# Patient Record
Sex: Female | Born: 1940 | Race: Black or African American | Hispanic: No | Marital: Married | Smoking: Never smoker
Health system: Southern US, Community
[De-identification: ages and names within clinical notes are randomized; demographics above are authoritative.]

## PROBLEM LIST (undated history)

## (undated) DIAGNOSIS — F419 Anxiety disorder, unspecified: Secondary | ICD-10-CM

## (undated) DIAGNOSIS — K219 Gastro-esophageal reflux disease without esophagitis: Secondary | ICD-10-CM

## (undated) DIAGNOSIS — K7689 Other specified diseases of liver: Secondary | ICD-10-CM

## (undated) DIAGNOSIS — G43909 Migraine, unspecified, not intractable, without status migrainosus: Secondary | ICD-10-CM

## (undated) DIAGNOSIS — R58 Hemorrhage, not elsewhere classified: Secondary | ICD-10-CM

## (undated) DIAGNOSIS — Z950 Presence of cardiac pacemaker: Secondary | ICD-10-CM

## (undated) DIAGNOSIS — I4891 Unspecified atrial fibrillation: Secondary | ICD-10-CM

## (undated) DIAGNOSIS — M1990 Unspecified osteoarthritis, unspecified site: Secondary | ICD-10-CM

## (undated) DIAGNOSIS — E785 Hyperlipidemia, unspecified: Secondary | ICD-10-CM

## (undated) DIAGNOSIS — I639 Cerebral infarction, unspecified: Secondary | ICD-10-CM

## (undated) DIAGNOSIS — I2510 Atherosclerotic heart disease of native coronary artery without angina pectoris: Secondary | ICD-10-CM

## (undated) DIAGNOSIS — Z7901 Long term (current) use of anticoagulants: Secondary | ICD-10-CM

## (undated) DIAGNOSIS — R0602 Shortness of breath: Secondary | ICD-10-CM

## (undated) DIAGNOSIS — I509 Heart failure, unspecified: Secondary | ICD-10-CM

## (undated) DIAGNOSIS — I495 Sick sinus syndrome: Secondary | ICD-10-CM

## (undated) DIAGNOSIS — I071 Rheumatic tricuspid insufficiency: Secondary | ICD-10-CM

## (undated) DIAGNOSIS — I214 Non-ST elevation (NSTEMI) myocardial infarction: Secondary | ICD-10-CM

## (undated) DIAGNOSIS — R011 Cardiac murmur, unspecified: Secondary | ICD-10-CM

## (undated) DIAGNOSIS — I10 Essential (primary) hypertension: Secondary | ICD-10-CM

## (undated) DIAGNOSIS — Z9289 Personal history of other medical treatment: Secondary | ICD-10-CM

## (undated) DIAGNOSIS — I5042 Chronic combined systolic (congestive) and diastolic (congestive) heart failure: Secondary | ICD-10-CM

## (undated) DIAGNOSIS — I051 Rheumatic mitral insufficiency: Secondary | ICD-10-CM

## (undated) DIAGNOSIS — S065X9A Traumatic subdural hemorrhage with loss of consciousness of unspecified duration, initial encounter: Secondary | ICD-10-CM

## (undated) DIAGNOSIS — S065XAA Traumatic subdural hemorrhage with loss of consciousness status unknown, initial encounter: Secondary | ICD-10-CM

## (undated) DIAGNOSIS — I4892 Unspecified atrial flutter: Secondary | ICD-10-CM

## (undated) DIAGNOSIS — J189 Pneumonia, unspecified organism: Secondary | ICD-10-CM

## (undated) HISTORY — DX: Long term (current) use of anticoagulants: Z79.01

## (undated) HISTORY — DX: Rheumatic tricuspid insufficiency: I07.1

## (undated) HISTORY — DX: Non-ST elevation (NSTEMI) myocardial infarction: I21.4

## (undated) HISTORY — DX: Unspecified atrial flutter: I48.92

## (undated) HISTORY — DX: Atherosclerotic heart disease of native coronary artery without angina pectoris: I25.10

## (undated) HISTORY — DX: Traumatic subdural hemorrhage with loss of consciousness of unspecified duration, initial encounter: S06.5X9A

## (undated) HISTORY — DX: Other specified diseases of liver: K76.89

## (undated) HISTORY — PX: CORONARY ANGIOPLASTY: SHX604

## (undated) HISTORY — DX: Chronic combined systolic (congestive) and diastolic (congestive) heart failure: I50.42

## (undated) HISTORY — PX: TONSILLECTOMY: SUR1361

## (undated) HISTORY — DX: Rheumatic mitral insufficiency: I05.1

## (undated) HISTORY — PX: HAMMER TOE SURGERY: SHX385

## (undated) HISTORY — PX: TUBAL LIGATION: SHX77

## (undated) HISTORY — PX: APPENDECTOMY: SHX54

## (undated) HISTORY — DX: Traumatic subdural hemorrhage with loss of consciousness status unknown, initial encounter: S06.5XAA

## (undated) HISTORY — PX: EXPLORATORY LAPAROTOMY: SUR591

## (undated) HISTORY — PX: DILATION AND CURETTAGE OF UTERUS: SHX78

---

## 1996-09-21 HISTORY — PX: MITRAL VALVE REPLACEMENT: SHX147

## 1997-04-02 HISTORY — PX: CARDIAC CATHETERIZATION: SHX172

## 1997-12-11 ENCOUNTER — Other Ambulatory Visit: Admission: RE | Admit: 1997-12-11 | Discharge: 1997-12-11 | Payer: Self-pay | Admitting: *Deleted

## 1997-12-24 ENCOUNTER — Encounter (HOSPITAL_COMMUNITY): Admission: RE | Admit: 1997-12-24 | Discharge: 1998-03-24 | Payer: Self-pay | Admitting: Cardiology

## 1999-06-26 ENCOUNTER — Emergency Department (HOSPITAL_COMMUNITY): Admission: EM | Admit: 1999-06-26 | Discharge: 1999-06-26 | Payer: Self-pay | Admitting: *Deleted

## 1999-06-29 ENCOUNTER — Inpatient Hospital Stay (HOSPITAL_COMMUNITY): Admission: EM | Admit: 1999-06-29 | Discharge: 1999-07-01 | Payer: Self-pay | Admitting: Emergency Medicine

## 1999-06-29 ENCOUNTER — Encounter: Payer: Self-pay | Admitting: Cardiology

## 2000-02-26 ENCOUNTER — Emergency Department (HOSPITAL_COMMUNITY): Admission: EM | Admit: 2000-02-26 | Discharge: 2000-02-26 | Payer: Self-pay | Admitting: Emergency Medicine

## 2000-04-07 ENCOUNTER — Encounter: Payer: Self-pay | Admitting: Emergency Medicine

## 2000-04-07 ENCOUNTER — Emergency Department (HOSPITAL_COMMUNITY): Admission: EM | Admit: 2000-04-07 | Discharge: 2000-04-07 | Payer: Self-pay | Admitting: Emergency Medicine

## 2000-06-17 ENCOUNTER — Encounter: Payer: Self-pay | Admitting: Cardiology

## 2000-06-17 ENCOUNTER — Encounter: Admission: RE | Admit: 2000-06-17 | Discharge: 2000-06-17 | Payer: Self-pay | Admitting: Cardiology

## 2000-06-21 ENCOUNTER — Ambulatory Visit (HOSPITAL_COMMUNITY): Admission: RE | Admit: 2000-06-21 | Discharge: 2000-06-21 | Payer: Self-pay | Admitting: Cardiology

## 2001-01-19 ENCOUNTER — Other Ambulatory Visit: Admission: RE | Admit: 2001-01-19 | Discharge: 2001-01-19 | Payer: Self-pay | Admitting: *Deleted

## 2001-05-09 ENCOUNTER — Encounter: Payer: Self-pay | Admitting: *Deleted

## 2001-05-09 ENCOUNTER — Encounter: Admission: RE | Admit: 2001-05-09 | Discharge: 2001-05-09 | Payer: Self-pay | Admitting: *Deleted

## 2002-02-01 ENCOUNTER — Other Ambulatory Visit: Admission: RE | Admit: 2002-02-01 | Discharge: 2002-02-01 | Payer: Self-pay | Admitting: *Deleted

## 2002-08-03 ENCOUNTER — Emergency Department (HOSPITAL_COMMUNITY): Admission: EM | Admit: 2002-08-03 | Discharge: 2002-08-03 | Payer: Self-pay | Admitting: Emergency Medicine

## 2002-08-14 ENCOUNTER — Emergency Department (HOSPITAL_COMMUNITY): Admission: EM | Admit: 2002-08-14 | Discharge: 2002-08-14 | Payer: Self-pay | Admitting: Emergency Medicine

## 2003-02-16 ENCOUNTER — Other Ambulatory Visit: Admission: RE | Admit: 2003-02-16 | Discharge: 2003-02-16 | Payer: Self-pay

## 2003-08-07 HISTORY — PX: OTHER SURGICAL HISTORY: SHX169

## 2003-09-05 ENCOUNTER — Inpatient Hospital Stay (HOSPITAL_COMMUNITY): Admission: AD | Admit: 2003-09-05 | Discharge: 2003-09-12 | Payer: Self-pay | Admitting: Cardiology

## 2005-02-03 ENCOUNTER — Other Ambulatory Visit: Admission: RE | Admit: 2005-02-03 | Discharge: 2005-02-03 | Payer: Self-pay | Admitting: Family Medicine

## 2005-04-14 ENCOUNTER — Encounter: Admission: RE | Admit: 2005-04-14 | Discharge: 2005-04-14 | Payer: Self-pay | Admitting: Family Medicine

## 2005-11-09 ENCOUNTER — Encounter: Payer: Self-pay | Admitting: Emergency Medicine

## 2005-11-09 ENCOUNTER — Inpatient Hospital Stay (HOSPITAL_COMMUNITY): Admission: EM | Admit: 2005-11-09 | Discharge: 2005-11-13 | Payer: Self-pay | Admitting: Cardiology

## 2005-11-09 ENCOUNTER — Ambulatory Visit: Payer: Self-pay | Admitting: Internal Medicine

## 2005-12-02 ENCOUNTER — Ambulatory Visit: Payer: Self-pay

## 2005-12-29 ENCOUNTER — Ambulatory Visit: Payer: Self-pay | Admitting: Internal Medicine

## 2006-03-11 ENCOUNTER — Ambulatory Visit: Payer: Self-pay | Admitting: Cardiology

## 2006-08-02 ENCOUNTER — Ambulatory Visit: Payer: Self-pay | Admitting: Gastroenterology

## 2006-09-06 ENCOUNTER — Ambulatory Visit: Payer: Self-pay | Admitting: Gastroenterology

## 2006-09-07 ENCOUNTER — Ambulatory Visit: Payer: Self-pay | Admitting: Gastroenterology

## 2006-12-13 ENCOUNTER — Inpatient Hospital Stay (HOSPITAL_COMMUNITY): Admission: AD | Admit: 2006-12-13 | Discharge: 2006-12-28 | Payer: Self-pay | Admitting: *Deleted

## 2006-12-15 HISTORY — PX: PACEMAKER PLACEMENT: SHX43

## 2006-12-15 HISTORY — PX: INSERT / REPLACE / REMOVE PACEMAKER: SUR710

## 2008-01-19 ENCOUNTER — Encounter: Admission: RE | Admit: 2008-01-19 | Discharge: 2008-01-19 | Payer: Self-pay | Admitting: Family Medicine

## 2008-02-20 ENCOUNTER — Encounter: Admission: RE | Admit: 2008-02-20 | Discharge: 2008-02-20 | Payer: Self-pay | Admitting: Family Medicine

## 2009-01-30 ENCOUNTER — Other Ambulatory Visit: Admission: RE | Admit: 2009-01-30 | Discharge: 2009-01-30 | Payer: Self-pay | Admitting: Family Medicine

## 2009-02-12 ENCOUNTER — Ambulatory Visit: Payer: Self-pay | Admitting: Gastroenterology

## 2009-02-12 DIAGNOSIS — K5900 Constipation, unspecified: Secondary | ICD-10-CM | POA: Insufficient documentation

## 2009-02-13 LAB — CONVERTED CEMR LAB
Basophils Absolute: 0 10*3/uL (ref 0.0–0.1)
Basophils Relative: 0.3 % (ref 0.0–3.0)
Calcium: 9.6 mg/dL (ref 8.4–10.5)
Chloride: 108 meq/L (ref 96–112)
Eosinophils Absolute: 0.2 10*3/uL (ref 0.0–0.7)
Eosinophils Relative: 1.9 % (ref 0.0–5.0)
Lymphocytes Relative: 23.9 % (ref 12.0–46.0)
Lymphs Abs: 2 10*3/uL (ref 0.7–4.0)
MCHC: 34.1 g/dL (ref 30.0–36.0)
MCV: 92.8 fL (ref 78.0–100.0)
Monocytes Absolute: 1.2 10*3/uL — ABNORMAL HIGH (ref 0.1–1.0)
Monocytes Relative: 13.6 % — ABNORMAL HIGH (ref 3.0–12.0)
Neutrophils Relative %: 60.3 % (ref 43.0–77.0)
Potassium: 4.6 meq/L (ref 3.5–5.1)
TSH: 2.21 microintl units/mL (ref 0.35–5.50)
WBC: 8.5 10*3/uL (ref 4.5–10.5)

## 2009-06-21 ENCOUNTER — Ambulatory Visit: Payer: Self-pay | Admitting: Oncology

## 2009-07-09 LAB — MORPHOLOGY - CHCC SATELLITE: PLT EST ~~LOC~~: ADEQUATE

## 2009-07-09 LAB — CBC WITH DIFFERENTIAL (CANCER CENTER ONLY)
BASO%: 0.9 % (ref 0.0–2.0)
EOS%: 2 % (ref 0.0–7.0)
LYMPH#: 1.7 10*3/uL (ref 0.9–3.3)
MCHC: 33 g/dL (ref 32.0–36.0)
NEUT#: 5.5 10*3/uL (ref 1.5–6.5)
NEUT%: 67.5 % (ref 39.6–80.0)
Platelets: 237 10*3/uL (ref 145–400)
RDW: 13.5 % (ref 10.5–14.6)

## 2009-07-09 LAB — CMP (CANCER CENTER ONLY)
AST: 46 U/L — ABNORMAL HIGH (ref 11–38)
Albumin: 3.6 g/dL (ref 3.3–5.5)
Alkaline Phosphatase: 123 U/L — ABNORMAL HIGH (ref 26–84)
Potassium: 3.9 mEq/L (ref 3.3–4.7)
Sodium: 138 mEq/L (ref 128–145)
Total Bilirubin: 0.6 mg/dl (ref 0.20–1.60)
Total Protein: 9.2 g/dL — ABNORMAL HIGH (ref 6.4–8.1)

## 2009-07-11 LAB — SPEP & IFE WITH QIG
Beta Globulin: 6.3 % (ref 4.7–7.2)
Gamma Globulin: 26.1 % — ABNORMAL HIGH (ref 11.1–18.8)
IgA: 666 mg/dL — ABNORMAL HIGH (ref 68–378)
IgG (Immunoglobin G), Serum: 2110 mg/dL — ABNORMAL HIGH (ref 694–1618)
Total Protein, Serum Electrophoresis: 8.6 g/dL — ABNORMAL HIGH (ref 6.0–8.3)

## 2009-07-11 LAB — KAPPA/LAMBDA LIGHT CHAINS: Lambda Free Lght Chn: 1.41 mg/dL (ref 0.57–2.63)

## 2009-07-11 LAB — BETA 2 MICROGLOBULIN, SERUM: Beta-2 Microglobulin: 1.71 mg/L (ref 1.01–1.73)

## 2009-07-11 LAB — IRON AND TIBC
%SAT: 30 % (ref 20–55)
Iron: 99 ug/dL (ref 42–145)

## 2009-07-15 LAB — UIFE/LIGHT CHAINS/TP QN, 24-HR UR
Beta, Urine: DETECTED — AB
Free Lambda Excretion/Day: 4.25 mg/d
Free Lambda Lt Chains,Ur: 0.17 mg/dL (ref 0.08–1.01)
Free Lt Chn Excr Rate: 45 mg/d
Gamma Globulin, Urine: DETECTED — AB
Time: 24 hours
Total Protein, Urine-Ur/day: 135 mg/d (ref 10–140)
Volume, Urine: 2500 mL

## 2009-08-12 ENCOUNTER — Ambulatory Visit: Payer: Self-pay | Admitting: Oncology

## 2009-08-19 LAB — CMP (CANCER CENTER ONLY)
ALT(SGPT): 23 U/L (ref 10–47)
AST: 50 U/L — ABNORMAL HIGH (ref 11–38)
Alkaline Phosphatase: 122 U/L — ABNORMAL HIGH (ref 26–84)
Creat: 0.7 mg/dl (ref 0.6–1.2)
Total Bilirubin: 0.5 mg/dl (ref 0.20–1.60)

## 2009-08-19 LAB — CBC WITH DIFFERENTIAL (CANCER CENTER ONLY)
BASO%: 1.2 % (ref 0.0–2.0)
EOS%: 2.6 % (ref 0.0–7.0)
HCT: 44.7 % (ref 34.8–46.6)
LYMPH%: 28.8 % (ref 14.0–48.0)
MCHC: 32.9 g/dL (ref 32.0–36.0)
MCV: 93 fL (ref 81–101)
MONO%: 6.7 % (ref 0.0–13.0)
NEUT%: 60.7 % (ref 39.6–80.0)
Platelets: 210 10*3/uL (ref 145–400)
RDW: 13.8 % (ref 10.5–14.6)

## 2010-03-06 ENCOUNTER — Ambulatory Visit: Payer: Self-pay | Admitting: Oncology

## 2010-03-14 LAB — CBC WITH DIFFERENTIAL (CANCER CENTER ONLY)
BASO#: 0.1 10*3/uL (ref 0.0–0.2)
EOS%: 2.2 % (ref 0.0–7.0)
Eosinophils Absolute: 0.2 10*3/uL (ref 0.0–0.5)
LYMPH%: 26.3 % (ref 14.0–48.0)
MCV: 92 fL (ref 81–101)
MONO%: 11.7 % (ref 0.0–13.0)
NEUT%: 59.1 % (ref 39.6–80.0)
Platelets: 284 10*3/uL (ref 145–400)
RBC: 4.21 10*6/uL (ref 3.70–5.32)
RDW: 13.4 % (ref 10.5–14.6)
WBC: 7.6 10*3/uL (ref 3.9–10.0)

## 2010-03-14 LAB — CMP (CANCER CENTER ONLY)
ALT(SGPT): 22 U/L (ref 10–47)
BUN, Bld: 14 mg/dL (ref 7–22)
CO2: 27 mEq/L (ref 18–33)
Glucose, Bld: 101 mg/dL (ref 73–118)
Potassium: 4.1 mEq/L (ref 3.3–4.7)
Sodium: 138 mEq/L (ref 128–145)
Total Bilirubin: 0.7 mg/dl (ref 0.20–1.60)

## 2010-03-14 LAB — LACTATE DEHYDROGENASE: LDH: 296 U/L — ABNORMAL HIGH (ref 94–250)

## 2010-03-18 LAB — SPEP & IFE WITH QIG
Albumin ELP: 49 % — ABNORMAL LOW (ref 55.8–66.1)
Gamma Globulin: 24.2 % — ABNORMAL HIGH (ref 11.1–18.8)
IgG (Immunoglobin G), Serum: 2260 mg/dL — ABNORMAL HIGH (ref 694–1618)
Total Protein, Serum Electrophoresis: 8.7 g/dL — ABNORMAL HIGH (ref 6.0–8.3)

## 2010-03-18 LAB — HAPTOGLOBIN: Haptoglobin: <6 mg/dL — ABNORMAL LOW (ref 16–200)

## 2010-03-18 LAB — DIRECT ANTIGLOBULIN TEST (NOT AT ARMC): DAT (Complement): NEGATIVE

## 2010-11-14 ENCOUNTER — Other Ambulatory Visit: Payer: Self-pay | Admitting: Oncology

## 2010-11-14 ENCOUNTER — Encounter (HOSPITAL_BASED_OUTPATIENT_CLINIC_OR_DEPARTMENT_OTHER): Payer: Medicare Other | Admitting: Oncology

## 2010-11-14 DIAGNOSIS — D892 Hypergammaglobulinemia, unspecified: Secondary | ICD-10-CM

## 2010-11-14 DIAGNOSIS — E8809 Other disorders of plasma-protein metabolism, not elsewhere classified: Secondary | ICD-10-CM

## 2010-11-14 DIAGNOSIS — R7989 Other specified abnormal findings of blood chemistry: Secondary | ICD-10-CM

## 2010-11-14 LAB — COMPREHENSIVE METABOLIC PANEL
AST: 46 U/L — ABNORMAL HIGH (ref 0–37)
Albumin: 4.2 g/dL (ref 3.5–5.2)
Alkaline Phosphatase: 110 U/L (ref 39–117)
BUN: 16 mg/dL (ref 6–23)
Chloride: 102 mEq/L (ref 96–112)
Glucose, Bld: 95 mg/dL (ref 70–99)
Potassium: 4.4 mEq/L (ref 3.5–5.3)

## 2010-11-14 LAB — CBC WITH DIFFERENTIAL/PLATELET
BASO%: 0.8 % (ref 0.0–2.0)
Basophils Absolute: 0.1 10*3/uL (ref 0.0–0.1)
EOS%: 2.2 % (ref 0.0–7.0)
HCT: 41.4 % (ref 34.8–46.6)
HGB: 14.2 g/dL (ref 11.6–15.9)
MCH: 30.5 pg (ref 25.1–34.0)
MCHC: 34.3 g/dL (ref 31.5–36.0)
MCV: 88.8 fL (ref 79.5–101.0)
MONO%: 8.8 % (ref 0.0–14.0)
NEUT#: 4.5 10*3/uL (ref 1.5–6.5)
Platelets: 230 10*3/uL (ref 145–400)
RBC: 4.66 10*6/uL (ref 3.70–5.45)
RDW: 15.9 % — ABNORMAL HIGH (ref 11.2–14.5)
WBC: 7.7 10*3/uL (ref 3.9–10.3)
nRBC: 0 % (ref 0–0)

## 2010-11-18 ENCOUNTER — Encounter (HOSPITAL_BASED_OUTPATIENT_CLINIC_OR_DEPARTMENT_OTHER): Payer: Medicare Other | Admitting: Oncology

## 2010-11-18 DIAGNOSIS — I059 Rheumatic mitral valve disease, unspecified: Secondary | ICD-10-CM

## 2010-11-18 DIAGNOSIS — D892 Hypergammaglobulinemia, unspecified: Secondary | ICD-10-CM

## 2010-11-18 LAB — SPEP & IFE WITH QIG
Albumin ELP: 49.2 % — ABNORMAL LOW (ref 55.8–66.1)
Beta 2: 7.8 % — ABNORMAL HIGH (ref 3.2–6.5)
Gamma Globulin: 24.3 % — ABNORMAL HIGH (ref 11.1–18.8)
IgA: 613 mg/dL — ABNORMAL HIGH (ref 68–378)
IgM, Serum: 82 mg/dL (ref 60–263)

## 2010-11-18 LAB — HAPTOGLOBIN: Haptoglobin: <6 mg/dL — ABNORMAL LOW (ref 16–200)

## 2010-11-18 LAB — KAPPA/LAMBDA LIGHT CHAINS: Kappa:Lambda Ratio: 1.21 (ref 0.26–1.65)

## 2011-02-06 NOTE — Discharge Summary (Signed)
NAME:  Carrie [name], Carrie [name]                           ACCOUNT NO.:  [id number]   MEDICAL RECORD NO.:  [id number]                   PATIENT TYPE:  INP   LOCATION:  3739                                 FACILITY:  [facility]   PHYSICIAN:  Thereasa Solo. Little, M.D.              DATE OF BIRTH:  January 17, 1941   DATE OF ADMISSION:  09/05/2003  DATE OF DISCHARGE:  09/12/2003                                 DISCHARGE SUMMARY   DISCHARGE DIAGNOSES:  1. Syncope.  2. Positive tilt table, medications adjusted this admission.  3. Paroxysmal atrial fibrillation, amiodarone increased after     electrophysiological evaluation this admission.  4. St. Jude mitral valve replacement in 1998.  5. Normal coronaries after abnormal Cardiolite study.  6. Elevated liver function tests, Vitorin has been discontinued.  7. Coumadin therapy.   HOSPITAL COURSE:  The patient is a 70 year old female followed by Dr.  Clarene Duke.  She has a history of a mitral valve replacement.  She has had Y  complex tachycardia in the past and has been worked up by Dr. Ladona Ridgel.  She  has had some recurrent syncope.  She had a Cardiolite study as an outpatient  that was abnormal.  She was taken off Coumadin as an outpatient and put on  Lovenox and admitted for diagnostic catheterization.  She was admitted on  September 05, 2003, and started on heparin.  Catheterization was done  September 06, 2003, by Dr. Alanda Amass which revealed normal coronaries and  normal LV function.  She did have wide QRS tachycardia on a Holter monitor.  She was seen by Dr. Ladona Ridgel.  EP study was done September 06, 2003.  She had  multiple left atrial flutter.  Dr. Ladona Ridgel recommended increasing her  amiodarone.  The patient was put back on Coumadin and covered with heparin.  It was noted that her LFTs were elevated and her Vitorin was held.  She  continued to have episodes of syncope.  One was after getting up off a  commode.  She had some transient bradycardia and junctional rhythms.   Tilt  table was done September 11, 2003, by Dr. Alanda Amass.  This revealed a gradual  vasopressor response to chills.  Dr. Alanda Amass recommended cutting back on  her Cardizem and stopping her diuretic.  At this point, he did not recommend  ProAmatine or Florinef.  Hope is that amiodarone will prevent her from  having recurrent atrial flutter.  We feel she can be discharged September 12, 2003.   DISCHARGE MEDICATIONS:  1. Amiodarone 400 mg today.  2. Pepcid AC if needed.  3. Diltiazem 120 mg a day.  4. Coumadin 5 mg a day as taken prior to admission.  5. Lisinopril 20 mg a day.  6. She is to stop her Vitorin and Diazide.   LABORATORY DATA:  At discharge, INR 2.8.  Sodium 137, potassium 5.1, BUN 14,  creatinine 1.1, hemoglobin  11.1, hematocrit 32.3, white count 6.3, platelets  310.  TSH 2.68.   DISPOSITION:  The patient discharged in stable condition.  Will have to  watch her INR closely on increased amiodarone.  She will have a protime  Monday.  She will follow up with Dr. Clarene Duke on September 27, 2003.  She will  need follow-up LFTs as an outpatient also as we stopped her Vitorin because  of LFT elevation.  Her SGOT was 78k, SGPT 47.      Abelino Derrick, P.A.                      Thereasa Solo. Little, M.D.    Lenard Lance  D:  09/12/2003  T:  09/13/2003  Job:  [id number]   cc:   Doylene Canning. Ladona Ridgel, M.D.

## 2011-02-06 NOTE — Cardiovascular Report (Signed)
NAME:  Carrie [name], Carrie [name]                           ACCOUNT NO.:  [id number]   MEDICAL RECORD NO.:  [id number]                   PATIENT TYPE:  INP   LOCATION:  3739                                 FACILITY:  [facility]   PHYSICIAN:  Richard A. Alanda Amass, M.D.          DATE OF BIRTH:  1941/04/09   DATE OF PROCEDURE:  09/06/2003  DATE OF DISCHARGE:                              CARDIAC CATHETERIZATION   PROCEDURE:  1. Retrograde central aortic catheterization.  2. Selective coronary angiography by Judkins technique.  3. Left ventricular angiogram RAO/LAO projection.  4. Abdominal angiogram hand injection.   BRIEF HISTORY:  This very pleasant 14 year old married mother of two with a  fourth grandchildren on her way is a nonsmoker.  Has presumed history of  rheumatic fever as a child with St. Jude MVR 1998.  Past history of  aberrated wide complex tachycardia in 1998 and seen by Dr. Ladona Ridgel at that  time.  She has been on chronic amiodarone therapy.  Has had a history of  PAF, is on chronic Coumadin for MVR which has been stable.  Prior coronary  arteries were normal as was LV.  She was admitted with recurrent syncope.  This occurred while sitting on a commode but she has had similar episodes in  the past not related to this often associated with palpitations.  Outpatient  Holter showed WCT which was regular.  It was difficult to tell whether this  was SVT with aberration (atrial flutter) or VT.  She was referred for  catheterization in this setting to assess coronaries and LV function.   DESCRIPTION OF PROCEDURE:  The left groin was prepped, draped in the usual  manner.  1% Xylocaine was used for local anesthesia and the LFA was entered  with a single anterior puncture using an 18 thin wall needle.  Coumadin had  been on hold.  Heparin was on hold and INR was 1.4.  A short Dake 6-French  sidearm sheath was inserted without difficulty and diagnostic coronary  angiography and LV angiogram were  done with 6-French 4 cm taper Cordis  preformed coronary and pigtail catheters.  Crossing the aortic valve was  without difficulty and there was no gradient across the aortic valve.  Hand  injection of pigtail catheter above the level of the renal arteries revealed  single normal renal arteries bilaterally and normal immediate infrarenal  abdominal aorta.   LV angiogram was done in the RAO and LAO projection 25 mL 14 mL/second, 20  mL 12 mL/second.  Pullback pressure of the CA showed no gradient across the  aortic valve.  Hand injection of the aortic root showed no AI present.   Catheter was removed.  Sidearm sheath was flushed.  The patient tolerated  the procedure well and sheath was removed in the laboratory with pressure  hemostasis over the left groin.  The left side was used since the patient  will have planned  EPS study with Dr. Ladona Ridgel from the right-sided venous  approach.   PRESSURES:  LV 130/0.  LVEDP 18 mmHg.   CA 130/63 mmHg.   There is no gradient across the aortic valve on catheter pullback.   There is no AI with hand injection of the aortic root which appeared normal.   LV angiogram shows a normally contracting LV with EF greater than 55%, no  segmental wall motion abnormality, and no MR present.  The mitral valve ring  to the St. Jude valve was easily visualized and the valve appeared to open  normally fluoroscopically.   Fluoroscopy did not show any significant coronary, intracardiac, or valvular  calcification.   The main left coronary was normal.  There was a small optional diagonal  branch that was normal.   The circumflex artery was of moderate size, nondominant, bifurcated  distally, and gave off a moderate sized PABG branch, all of which were  normal.   The LAD was tortuous, coursed to the apex of the heart where it bifurcated,  then was normal.  There was a large DX1 after SP1 that was normal and it  bifurcated distally.   The right coronary was  dominant and normal with normal PDA/PLA, RV branches  and it was mildly tortuous.   DISCUSSION:  History as outlined above.  The patient has normal LV function,  no evidence of aortic valve disease, and presumed normal St. Jude mitral  valve prosthesis without MR in this study.  Coronary arteries remain normal.   She has mild elevation of LFTs which could be related to Vytorin  administration or could be related to her chronic amiodarone administration.  She is scheduled for EPS evaluation with Dr. Ladona Ridgel and possible ablation if  this represents SVT (atrial flutter with aberration).  The etiology of her  syncope could be tachyarrhythmic and/or bradyarrhythmic from after  conversion based on her history and available data.   CATHETERIZATION DIAGNOSES:  1. Normal coronary arteries.  2. Normal left ventricle.  3. No aortic valve gradient or aortic insufficiency.  4. Syncope with nonsustained WCT on Holter monitor, recurrent episodes of     presyncope and syncope, and palpitations.  5. Status post mitral valve replacement functioning normally, 1998.  6. Hyperlipidemia.                                               Richard A. Alanda Amass, M.D.    RAW/MEDQ  D:  09/06/2003  T:  09/07/2003  Job:  [id number]   cc:   Doylene Canning. Ladona Ridgel, M.D.   Talmadge Coventry, M.D.  [address]  [place]  [place] [address]  Fax: [phone]

## 2011-02-06 NOTE — Consult Note (Signed)
NAMEJAKIAH, BIENAIME                 ACCOUNT NO.:  [id number]   MEDICAL RECORD NO.:  [id number]          PATIENT TYPE:  INP   LOCATION:  6532                         FACILITY:  [facility]   PHYSICIAN:  Gustavus Messing. Orlin Hilding, M.D.DATE OF BIRTH:  02-Jan-1941   DATE OF CONSULTATION:  11/10/2005  DATE OF DISCHARGE:                                   CONSULTATION   REASON FOR CONSULTATION:  Syncope or near syncope.   HISTORY OF PRESENT ILLNESS:  Ms. Rockholt is a 70 year old right handed black  African American woman with a history of atrial fibrillation and mitral  valve disease status post mitral valve replacement.  She is on Digitek,  Diltiazem, and Coumadin.  She has a ten year history of pre-syncope or near  syncope events, possibly syncope, as well.  According to the patient, she  gets a spell about every other month, roughly six times a year.  She had a  spell yesterday that was fairly severe which brought her to the emergency  room  and warranted admission.  Her spells are similar in character, but may  vary in severity and duration.  Usually, she is standing when it occurs and  it usually starts with a sensation of light headedness, palpitations,  diaphoresis, and blurred vision, she feels like she is going to faint.  She  will try to sit down or lie down and hold very still and it may pass at that  level after a few minutes or it may progress to nausea and vomiting and  collapse.  It is unclear if there is loss of consciousness, she is not able  to tell me that.  As noted, when it occurs, she tends to light down or sit  down, hold very still and close her eyes.  When it is severe, it may last  for several hours and then it clears.  This event yesterday did last for  several hours and today she is at her normal baseline this morning.  There  is no associated weakness or numbness on one side of the body or the other,  no associated slurred speech or dysphagia.  She did have one episode of  incontinence during a spell years ago but none recently.  She has never had  any convulsions or tongue biting.  No significant headache with this.   REVIEW OF SYMPTOMS:  Out of a 12 system review which encompass  cardiovascular, pulmonary, neurologic, hematologic, GI, GU, musculoskeletal,  ENT, reproductive, skin, mucosa, pain, sleep, and nutrition with the  following positive; syncope, light headedness, nausea, arthritis and joint  pain in her fingers, partial dentures in the bottom, corrective lenses for  reading.  She is post menopausal.  She does have some difficulty sleeping.   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  She has mitral valve replacement,  tubal ligation, a cyst removed from her intestines, hyperlipidemia, history  of diverticulitis.  The patient denies any previous  history of migraine.   MEDICATIONS ON ADMISSION:  Diltiazem, Coumadin, Ranitidine, Lisinopril,  Digitek, and Temazepam.   HOSPITAL MEDICATIONS:  She is on Diltiazem, Lanoxin, Lisinopril, Pepcid,  Coumadin, Tylenol, and Ambien.   ALLERGIES:  CODEINE.   SOCIAL HISTORY:  She has two children and six grandchildren.  She does not  smoke.  She lives at home with her husband, she is retired.   FAMILY HISTORY:  Negative for seizure or migraine.   PHYSICAL EXAMINATION:  VITAL SIGNS:  Temperature 97.9, heart rate in the 80s to 90s, blood pressure  132/60, 90% saturation on room air, respirations 16.  HEENT:  Head normocephalic, atraumatic.  Ears, nose and throat are clear  without erythema.  NECK:  Supple without bruits.  LUNGS:  Clear to auscultation.  HEART:  Irregularly irregular.  EXTREMITIES:  No edema or cyanosis.  NEUROLOGIC:  She is awake, alert, appropriate with normal language and  orientation, oriented x 3.  Cranial nerves:  Pupils equal and  reactive,  visual fields full, extraocular movements intact, facial sensation is  normal, facial motor activity is normal, hearing intact, palate symmetrical,  tongue midline, there is no dysarthria.  On motor exam, she has normal  station and gait.  She has normal bulk, tone, and strength, 5/5 strength in  all four extremities.  No drift, no fasciculations, atrophy, or tremor.  Reflexes are 1-2+ and symmetrical with downgoing toes, plantar stimulation.  Coordination, finger-to-nose, alternating movements, heel-to-shin, tandem  gait are normal.  She can balance on either foot.  Sensory exam intact to  pin prick.   LABORATORY DATA:  There are no neurological imaging studies to evaluate.  She did have a CT of the head back in July 2001 with a syncopal episode  which is negative.  She has normal white blood cell count.  Normal  hemoglobin.  INR is 2.9 on her Coumadin.  BMP was normal.   ASSESSMENT:  Near syncope with spells about six times a year for ten years.  Differential would be:  1.  Neurocardiogenic syncope given her positive tilt table test in the past.  2.  Other cardiac origin as worked up by cardiology.  3.  Vertebrobasilar insufficiency.  4.  Seizure, she did have one episode of incontinence.  5.  Atypical migraines.   RECOMMENDATIONS:  Get an MRI scan of the brain with MRA of the head and neck  to evaluate the vertebrobasilar circulation if the St. Jude is MR  compatible, otherwise a CT of the head with CT angio of the head and neck  would be helpful.  We will also get an EEG.  If the cardiac workup is  completely negative, we can consider after her loop monitor evaluation,  empiric treatment with Depakote or Topamax for coverage of seizure and  migraine, but I am not convinced that seizure or migraine are likely.  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  We will proceed with a neurological workup, however.     Catherine A. Orlin Hilding,  M.D.  Electronically Signed     CAW/MEDQ  D:  11/10/2005  T:  11/10/2005  Job:  [id number]   cc:   Thereasa Solo. Little, M.D.  Fax: [phone]

## 2011-02-06 NOTE — Discharge Summary (Signed)
Carrie [name], Carrie [name]                 ACCOUNT NO.:  [id number]   MEDICAL RECORD NO.:  [id number]          PATIENT TYPE:  INP   LOCATION:  6532                         FACILITY:  [facility]   PHYSICIAN:  Thereasa Solo. Little, M.D. DATE OF BIRTH:  01/26/1941   DATE OF ADMISSION:  11/09/2005  DATE OF DISCHARGE:  11/13/2005                                 DISCHARGE SUMMARY   DISCHARGE DIAGNOSES:  1.  Near syncopal spell, status post loop recorder implantation this      admission.  2.  History of paroxysmal atrial fibrillation.  3.  St. Jude mitral valve replacement in 1998.  4.  Chronic Coumadin therapy.  5.  Treated hypertension.   HOSPITAL COURSE:  The patient is a 70 year old female followed by Dr. Clarene Duke  and Dr. Smith Mince with a history of atrial fibrillation and St. Jude mitral  valve replacement. She has normal coronaries and LV function in December  2004. She has been seen by Dr. Ladona Ridgel and Dr. Severiano Gilbert for EP. She was last  seen by Dr. Clarene Duke in November 2006 and was in sinus rhythm at that time.  Today, on admission at [facility] she developed a dizzy spell with  increased heart rate. She is seen in the emergency room. In the emergency  room she was in atrial fibrillation with a rate of 78. Her INR was 2.9.  She  was admitted to telemetry and seen by Dr. Jenne Campus. She also had hyponatremia  on admission with a sodium of 122. This was repeated the next day and was  138. The patient was seen by the EP service. Dr. Graciela Husbands recommended a loop  recorder implantation. Her LFTs were elevated and it was decided that the  patient should not be on amiodarone. She was also seen in consult by the  neurology service for her dizziness and near syncope. An MRI, MRA, and EEG  were obtained. The EEG was normal, MRI was normal for vertebrobasilar  insufficiency and MRI showed possible tiny infarct. Dr. Thurston Hole did not think  that that was the explanation for the patient's dizziness. Dr. Graciela Husbands saw the  patient  after this. The patient was set up for a loop recorder which was  implanted on November 12, 2005, by Dr. Antoine Poche. We felt she could be  discharged on November 13, 2005.  She is to follow up at the [facility] on December 02, 2005. Her Lanoxin was increased at discharge.   DISCHARGE MEDICATIONS:  1.  Cardizem CD 240 mg daily.  2.  Lanoxin 0.25 mg daily.  3.  Lisinopril 20 mg daily.  4.  Zantac 150 mg daily.  5.  Coumadin 7.5 mg on Monday, Wednesday, and Saturday, and 5 mg other days.   She will have a protime checked in one week. She will follow up with Dr.  Ladona Ridgel and Dr. Clarene Duke.   LABORATORY DATA:  White count 7.8, hemoglobin 12.1, hematocrit 35.5,  platelet count 288,000. INR on the 23rd was 2.6. Sodium 138, potassium 4.8,  BUN 11, creatinine 0.9, AST 38, ALT 14. CK and troponin were negative.  Digoxin level was 0.4. Amiodarone level was low at 0.12.  Urine sodium was  normal at 64.   DISPOSITION:  The patient is discharged in stable condition. She will follow  up with Dr. Ladona Ridgel and Dr. Clarene Duke.      Carrie [name], P.A.    ______________________________  Thereasa Solo. Little, M.D.    Lenard Lance  D:  01/12/2006  T:  01/13/2006  Job:  [id number]   cc:   Talmadge Coventry, M.D.  Fax: [phone]

## 2011-02-06 NOTE — Op Note (Signed)
NAME:  Carrie [name], DRABIK                           ACCOUNT NO.:  [id number]   MEDICAL RECORD NO.:  [id number]                   PATIENT TYPE:  INP   LOCATION:  3739                                 FACILITY:  [facility]   PHYSICIAN:  Doylene Canning. Ladona Ridgel, M.D.               DATE OF BIRTH:  10-May-1941   DATE OF PROCEDURE:  09/06/2003  DATE OF DISCHARGE:                                 OPERATIVE REPORT   PROCEDURE PERFORMED:  Invasive electrophysiologic study with induction of  arrhythmia and intracardiac mapping of arrhythmias.   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  She has had a  history of palpitations in the past.  She has had syncope.  She had a  cardiac monitor placed for recurrent palpitations and was found to have  sustained wide QRS tachycardia at rates of approximately 140 to 150 beats  per minute.  These palpitations were associated with syncope.  She is now  referred for invasive electrophysiologic testing.  Of note, the patient has  undergone catheterization demonstrating normal LV systolic function with no  coronary disease.   II:  PROCEDURE:  After informed consent was obtained, the patient was taken  to the diagnostic EP lab in a fasting state.  A 5 French quadripolar  catheter was inserted percutaneously in the right femoral vein and advanced  to the RV apex.  A 5 French quadripolar catheter was inserted percutaneously  in the right femoral vein and advanced to the His bundle region.  A 7 French  quadripolar catheter was inserted percutaneously in the right femoral vein  and advanced to the right atrium.  A 6 French hexapolar catheter was  inserted percutaneously into the right jugular vein and advanced to the  coronary sinus.  After measurement of the basic intervals, mapping was  carried out demonstrating an HV interval of 57 milliseconds.  Rapid atrial  pacing was carried out from the  coronary sinus at a pacing cycle length of  590 milliseconds.  This resulted in the initiation of atrial tachycardia.  Mapping of this tachycardia which had a cycle length of approximately 580 to  590 milliseconds demonstrated the earliest activation in the left atrium  which preceded the His bundle lesion which preceded all activation in the  right atrium consistent with a left atrial tachycardia.  This would last for  several minutes and then stopped spontaneously.  Next, programmed atrial  stimulation was carried out from the coronary sinus at a paced cycle length  of 500 milliseconds.  The S1 and S2 interval was step wise decreased from  440 milliseconds down to 360 milliseconds where the AV node ERP was  observed.  During programmed atrial stimulation, there was a recurrence of  the patient's slow left atrial tachycardia.  Next, rapid ventricular pacing  was carried out  demonstrating VA dissociation.  At this point, additional  rapid atrial pacing was carried out from the coronary sinus at 290  milliseconds and step wise decreased down to 250 milliseconds.  This  resulted in the initiation of multiple left atrial flutters with multiple  cycle lengths and, as noted, atrial activations.  The most dominant rhythm  was a counter clockwise activation of the right atrium with the earliest  left atrial activation occurring in the distal coronary sinus.  This  arrhythmia was consistent but not diagnostic of a left atrial flutter  wrapping around the superior pulmonary vein.  The tachycardia would  spontaneously degenerate into atrial fibrillation.  There was intermittent  bundle branch block aberrancy with the patient heavily sedated and lying on  the table.  During flutter, isoproterenol was infused at a rate of 1 mcg per  minute.  This resulted in an increase both of the atrial flutter cycle  length as well as the variability of the atrial flutter as well as an  increase in the AV conduction  resulting in sustained left bundle branch  block wide QRS pattern.  Of note, during the atrial flutter with her bundle  branch block, the patient had a reproduction of her clinical symptoms that  were present prior to her syncopal spells.  At this point, the isoproterenol  was discontinued and the patient underwent a programmed ventricular  stimulation from the right ventricle.  S1-S2, S1-S2-S3, and S1-S2-S3-S4  stimuli were then delivered by way of the RV apex.  S1-S2, S2-S3, S3-S4,  stimuli were delivered with the S1-S2, S2-S3, and S3-S4 interval step wise  decreased down to ventricular refractions.  During programmed ventricular  stimulation from the RV apex there was no inducible VT.  At this point, the  patient's underlying atrial flutter persisted and she was deeply sedated  with Fentanyl and Versed and underwent a 150 joule biphasic synchronized DC  shock which transiently restored sinus rhythm.  However, frequent PACs  resulted in the reinitiation of atrial flutter.  At this point, the  catheters were removed, hemostasis assured, and the patient returned to the  room in satisfactory condition.  Of note, during the reinitiation of atrial  flutter, the AV conduction was markedly reduced, now that isoproterenol was  discontinued, with a ventricular rate between 70 and 90 beats per minute.   III:  COMPLICATIONS:  There were no immediate procedure complications.   IV:  RESULTS:  A.  Baseline ECG.  The baseline ECG demonstrates normal sinus rhythm with  normal axis and intervals.  B.  Baseline intervals.  The HP interval was 57 milliseconds.  C.  Rapid atrial pacing.  Rapid atrial pacing initially carried out  demonstrated an AV Wenckebach cycle length of 430 milliseconds.  During  rapid atrial pacing, there was an atrial tachycardia which was left atrial  in origin and would terminate spontaneously.  This had a cycle length of approximately 584 milliseconds.  There was intermittent  bundle branch block  with this.  D.  Programmed atrial stimulation.  Programmed atrial stimulation was  carried out from the coronary sinus as well as the high right atrium at  paced cycle length of 500 milliseconds.  The S1 and S2 interval was stepwise  decreased from 440 milliseconds down to 360 milliseconds where the AV node  ARP was observed.  During programmed atrial stimulation, there were no AH  jumps or echo beats noted.  It should be noted that during programmed atrial  stimulation,  the patient's slow left atrial tachycardia at 584 milliseconds  was reproducibly reinduced.  E.  Rapid ventricular pacing.  Rapid ventricular pacing demonstrated VA  dissociation.  F.  Programmed ventricular stimulation.  Programmed ventricular stimulation  was carried out from the RV apex with paced cycle lengths of 500 and 400  milliseconds.  S1-S2, S1-S2-S3, S1-S2-S3-S4 stimuli were delivered with the  S1-S2, S2-S3, S3-S4 interval step wise decreased down to ventricular  refractions.  During programmed ventricular stimulation, there was no  inducible VT.  G.  Arrhythmias observed:  1. Left atrial tachycardia initiation, rapid atrial pacing, duration     sustained, termination spontaneous, cycle length 584 milliseconds.  2. Atrial flutter initiation, rapid atrial pacing, duration sustained,     termination was with DC cardioversion, cycle length was quite variable     with multiple atrial activations consistent with multiple atrial circuits     with the clear theme of mapping demonstrating origination of these     circuits predominantly from the left atrium.     A. Mapping:  Mapping of the patient's atrial flutters demonstrated left        atrial activation as previously described.   V:  CONCLUSION:  The study demonstrates multiple left atrial inducible  flutters.  It also demonstrates reproduction of the patient's clinical  arrhythmia, both symptomatically and based on the EKG.  This  demonstrates no  inducible VT.  The recommendation at this time would be to treat the patient  with increasing doses of amiodarone.  Because of her left atrial dysfunction  and prior mitral valve replacement and scarring in the left atrium, I think  an  aggressive approach with left atrial flutter ablation would likely not be  warranted.  If she had recurrence of her symptoms despite increasing doses  of Amiodarone, then consideration for AV node ablation and pacemaker  insertion would also be a consideration with subsequent discontinuation of  her Amiodarone.                                               Doylene Canning. Ladona Ridgel, M.D.    GWT/MEDQ  D:  09/06/2003  T:  09/07/2003  Job:  [id number]   cc:   Thereasa Solo. Little, M.D.  [address]  [address]  [place]  [place] [address]  Fax: [phone]   Christella Noa, M.D.  [address]  [place], [place] [address]  Fax: [phone]

## 2011-02-06 NOTE — Discharge Summary (Signed)
NAMEKARA, MELCHING NO.:  [id number]   MEDICAL RECORD NO.:  [id number]          PATIENT TYPE:  INP   LOCATION:  2008                         FACILITY:  [facility]   PHYSICIAN:  Darcella Gasman. [place], N.P.  DATE OF BIRTH:  April 29, 1941   DATE OF ADMISSION:  12/13/2006  DATE OF DISCHARGE:  12/28/2006                               DISCHARGE SUMMARY   DISCHARGE DIAGNOSES:  1. Heparin Coumadin cross over secondary to St. Jude mitral valve      replacement in 1998 and now need for permanent pacemaker.  2. St. Jude multiple valve replacement 1998.  3. Paroxysmal atrial fibrillation with known sick sinus syndrome.  4. Hypertension.  5. Syncope in the past.  Loop recorder had been added with results of      atrial fibrillation with ventricular rate 180-200 as well as 3-4      second pauses.  6. History of electrophysiologic study by Dr. Lewayne Bunting with      negative for inducible ventricular tachycardia.  7. Normal coronary arteries and normal left ventricular function.  8. Pacer pocket hematoma.   DISCHARGE CONDITION:  Stable.   DISCHARGE MEDICATIONS:  1. Diltiazem 240 mg daily.  2. Lisinopril 10 mg daily.  3. Lanoxin  0.125 mg Monday, Wednesday and Friday.  4. Zantac 150 mg twice daily.  5. Toprol XL 50 mg daily.  6. Coumadin 7.5 mg Monday, Tuesday, Wednesday and Friday.  5 mg on      Thursday, Saturday and Sunday.  7. Lorazepam 0.5 mg at night as needed.  8. Ambien 5 mg at night as needed  9. Vitamin E every other day, hold for one week.  10.Cephalexin 500 mg twice daily.   DISCHARGE INSTRUCTIONS:  1. Have labs done on December 30, 2006 to check Coumadin.  2. Increase activity slowly, may walk up steps.  No lifting for two      weeks.  No driving for one week.  Please see pacer site      instructions for further information.  3. Low sodium heart healthy diet.  4. Keep the incision clean and dry.  Call for redness, swelling or      drainage.  5. Followup with Dr.  Mikey Bussing nurse practitioner, Nada Boozer, on      December 31, 2006.  Office will call with a time.  6. Followup with Dr. Clarene Duke as previously scheduled.   HISTORY OF PRESENT ILLNESS:  This is a 70 year old female with history  of hypertension, St. Jude mitral valve replacement, PAF and sick sinus  syndrome.  History of syncope in the past,  non-inducible for  ventricular tachycardia by Dr. Ladona Ridgel.  Loop recorded had been added.  Dr. Jenne Campus had interrogated the loop recorders.  She was having atrial  fibrillation with heart rates of 180-200 and in addition to that 3-4  second pauses.  The Lopressor needed to be re-started.  Due to her  history of syncope in the past, it was felt she would need permanent  pacemaker.   She was seen and evaluated by Dr. Domingo Sep.  She  had been holding her  Coumadin for Heparin Coumadin crossover.   PAST MEDICAL HISTORY:  As described.   OUTPATIENT MEDICATIONS:  1. Cardizem 240 mg  2. Coumadin as directed.  3. Lanoxin 0.25 mg half every other day.  4. Lisinopril 20 mg daily.  5. Zantac daily.  6. Metoprolol ER 25 mg half daily.   ALLERGIES:  NO KNOWN DRUG ALLERGIES.   FAMILY HISTORY/SOCIAL HISTORY/REVIEW OF SYSTEMS:  See H&P.   PHYSICAL EXAMINATION:  VITAL SIGNS:  At discharge blood pressure 115/61.  Pulse 87.  Respiratory rate is 18.  Temperature 97.  Oxygen saturation  98%.  GENERAL:  Pacer site with hematoma but much improved from 1 week ago.  HEART:  Regular rate and rhythm.  LUNGS:  Clear.  ABDOMEN:  Positive bowel sounds.   LABORATORY DATA:  Admitting labs:  Hemoglobin 13.4, hematocrit 39.8, WBC  8.9, platelets 270, neutrophils 54, lymphs 32, monos none, eos 4,  basophils 1.  These remained stable throughout hospitalization.  Pro-  Time on admission 23.8, INR of 2.  She was started on Heparin and was  therapeutic on Heparin.  INR drifted down to 1.5 for pacer.   She was restarted on Heparin as well as Coumadin and developed hematoma.   Heparin was held.  INR gradually rose and at discharge was 2.6.   Chemistry:  Sodium 138, potassium 4.3, chloride 105, CO2 25, glucose  183, BUN 16, creatinine 0.93.  These remained stable.  Total bili is  8.6, albumin 4.  AST 77, ALT 44 and alkaline phos 128.  Total bili 0.9.  Magnesium 2.4.  AST and ALT did come down during hospitalization.  At  discharge AST 54, ALT 29 and alkaline phos 114.  TSH was 1.224.  UA  showed a few leukocyte esterase.   Chest x-ray:  Left pacemaker placement with lead overlying the right  ventricle.  No pneumothorax.  Stable cardiomegaly.  Mild pulmonary  vascular congestion.  EKG on March 25, atrial fibrillation with  premature beats.  Nonspecific T wave abnormalities.   PROCEDURE:  December 15, 2006:  Permanent pacemaker implantation by Dr.  Lenise Herald with a Medtronic Adapta placement.  The patient tolerated  the procedure.  It was a single chamber lead.   HOSPITAL COURSE:  Ms. Schomer was admitted for a Heparin Coumadin  crossover secondary to St. Jude mitral valve and need for permanent  pacemaker with sick sinus syndrome with heart rates up to 180 to 200 in  atrial fibrillation and other times 3-4 second pauses on a loop  recorder.  She was admitted and placed on IV Heparin and underwent  permanent pacemaker on December 15, 2006.   The patient tolerated the procedure well.  She was restarted on Heparin  secondary to the valve.  Unfortunately, she did develop a hematoma.  It  was expressed several times.  Heparin was held and Coumadin was  continued.  By March 29, hematoma was stable.  Heparin was restarted  after discussion with Dr. Graciela Husbands and Dr. Orvan Falconer and Dr. Domingo Sep.  Hematoma remained stable.  There was no discharge by April 1.  The  patient continued to be stable and plans were to discharge once her INR  was therapeutic.  By April 3, she had bloody discharge from the pacer site.  It was expressed.  It did not appear infected.  She was kept   another day.  By April 4, she continued with hematoma but was stable and  Heparin was  discontinued and continued to monitor.  By April 5, minimal  drainage from the pacer pocket.  Otherwise the patient was stable.  No  infection noted.   Continue to monitor closely.  Minimal drainage was occurring at the  pocket site.  By April 8, the pocket was still with hematoma with some  oozing present.  Discussed with Dr. Jenne Campus and felt that she was stable  to be discharged but she would be followed up as an outpatient with  close  followup.      Darcella Gasman. Annie Paras, N.P.     LRI/MEDQ  D:  02/22/2007  T:  02/22/2007  Job:  [id number]   cc:   Thereasa Solo. Little, M.D.  Talmadge Coventry, M.D.

## 2011-02-06 NOTE — Op Note (Signed)
NAMEKALIEGH, WILLADSEN NO.:  [id number]   MEDICAL RECORD NO.:  [id number]          PATIENT TYPE:  INP   LOCATION:  6532                         FACILITY:  [facility]   PHYSICIAN:  Rollene Rotunda, M.D.   DATE OF BIRTH:  1941-04-27   DATE OF PROCEDURE:  11/12/2005  DATE OF DISCHARGE:  11/09/2005                                 OPERATIVE REPORT   PREOPERATIVE DIAGNOSIS:  Syncope.   POSTOPERATIVE DIAGNOSIS:  Syncope.   PROCEDURE:  Implantable loop recorder.   PROCEDURE NOTE:  Appropriate informed consent was obtained.  The risks and  benefits were explained to the patient.  Very clearly, the risks of bleeding  was explained as her INR was 3.1.  However, it was felt that it was prudent  to proceed as she would need to have a therapeutic INR with her mitral valve  replacement.  Also, having therapeutic Coumadin level will allow [place] to avoid  heparin or Lovenox which would exacerbate bleeding.  The risks of infection  was also explained.   The patient was brought to the EP lab.  The left parasternal area was  prepped and draped in a sterile fashion.  The area was mapped for optimal  tracings.  The skin was anesthetized with lidocaine.  A 2.5 cm incision was  carried through the skin down to the prepectoral fascia.  A pocket was  formed using only blunt dissection.  I used electrocautery and careful  inspection to insure hemostasis.  The pocket was then copiously irrigated  with Kanamycin solution and inspected again for hemostasis.  Using two silk  sutures, a Reveal Plus Medtronic recorder was fixed in the pocket, serial  number [id number], model number 9526.  The device was inserted into the  pocket and the pocket and device were again copiously irrigated with  Kanamycin solution.  Again, the pocket was visually inspected to insure  hemostasis.  The pocket was then closed in two layers using 2-0 Vicryl in a  running vertical mattress followed by 2-0 Vicryl in a running  horizontal  mattress suture.  The subcutaneous layer was a 4-0 Vicryl in a running  horizontal layer.  The wound was then prepped and draped with Benzoin  solution and Steri-Strips.  A pressure dressing was applied.  We will watch  the wound carefully and insure that she keeps her arm relatively still for  the next 24 hours.  There were no apparent complications.           ______________________________  Rollene Rotunda, M.D.     JH/MEDQ  D:  11/12/2005  T:  11/12/2005  Job:  [id number]   cc:   Milus Banister, M.D.

## 2011-02-06 NOTE — H&P (Signed)
Carrie [name], Carrie [name]                 ACCOUNT NO.:  [id number]   MEDICAL RECORD NO.:  [id number]          PATIENT TYPE:  INP   LOCATION:  6532                         FACILITY:  [facility]   PHYSICIAN:  Thereasa Solo. Little, M.D. DATE OF BIRTH:  [birth year]   DATE OF ADMISSION:  11/09/2005  DATE OF DISCHARGE:                                HISTORY & PHYSICAL   CHIEF COMPLAINT:  Dizziness.   HISTORY OF PRESENT ILLNESS:  Patient is a 70 year old female with a history  of chronic atrial fibrillation, St. Jude mitral valve replacement, and  normal coronaries and LV function in December of 2004.  She has been seen by  Dr. Ladona Ridgel and Dr. Severiano Gilbert.  Her last office visit with Dr. Clarene Duke was in  November 2006.  She was in sinus rhythm at that time.  Today while at  [facility] she suddenly became dizzy with tachycardia and had to sit down in  the middle of the aisle.  Her symptoms were associated with nausea and  diaphoresis.  In the emergency room she is in atrial fibrillation with  ventricular response in the 70s.   PAST MEDICAL HISTORY:  PAF.  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   PAST SURGICAL HISTORY:  1.  St. Jude mitral valve replacement in 1988.  2.  She has had tubal ligation.  3.  Abdominal cyst removed as a teenager.   MEDICATIONS:  1.  Cardizem CD 240 daily.  2.  Coumadin 7.5 mg on Monday, Wednesday, and Saturday and 5 mg other days.  3.  Lanoxin 0.125 mg a day.  4.  Lisinopril 20 mg a day.  5.  Zantac 150 daily.   She has no known drug allergies.   SOCIAL HISTORY:  She is married.  She has two children, five grandchildren.  She is a nonsmoker.   FAMILY HISTORY:  Unremarkable.   REVIEW OF SYSTEMS:  Essentially unremarkable except for noted above.  She  denies any recent illness or fever.  She is on no new medications.   PHYSICAL EXAMINATION:  VITAL SIGNS:  Blood pressure 155/82, pulse 75,  respirations 16, temperature 97.9.  GENERAL:  She is a  well-developed, well-nourished female in no acute  distress.  HEENT:  Normocephalic.  Extraocular movements are intact.  Sclera is non-  icteric.  Lids and conjunctivae are within normal limits.  NECK:  Without bruit, without JVD.  CHEST:  Clear to auscultation, percussion.  CARDIAC:  Irregularly irregular rhythm without obvious murmur or rub.  There  is positive bowel sounds.  ABDOMEN:  Midline surgical scar.  Nontender.  Bowel sounds are present.  EXTREMITIES:  Without edema.  Distal pulses are 3+/4 bilaterally.  NEUROLOGIC:  Grossly intact.   IMPRESSION:  1.  Near syncopal spell with previous EP evaluation.  2.  Paroxysmal atrial fibrillation now in atrial fibrillation.  3.  History of St. Jude mitral valve replacement in 1988.  4.  Hypertension, controlled.  5.  Hyponatremia.  Sodium on admission was 122.  She is now on a diuretic.  6.  Hypocalcemia, potassium 3 on admission.   PLAN:  She will be given saline at 75 mL an hour and will replace her  potassium.  She will need admission to telemetry.      Abelino Derrick, P.A.    ______________________________  Thereasa Solo. Little, M.D.    Lenard Lance  D:  11/12/2005  T:  11/12/2005  Job:  [id number]

## 2011-02-06 NOTE — Op Note (Signed)
Carrie [name], Carrie [name] NO.:  [id number]   MEDICAL RECORD NO.:  [id number]          PATIENT TYPE:  INP   LOCATION:  2008                         FACILITY:  [facility]   PHYSICIAN:  Darlin Priestly, MD  DATE OF BIRTH:  07-23-1941   DATE OF PROCEDURE:  12/15/2006  DATE OF DISCHARGE:                               OPERATIVE REPORT   PROCEDURE:  1. Removal of a Medtronic Loop Recorder serial [id number], model      [id number], initially implanted November 12, 2005 by Dr. Rollene Rotunda.  2. Implant of a Medtronic Adapta generator model [id number], serial      [id number] H with single passive ventricular lead.   SURGEON:  Lenise Herald, MD   COMPLICATIONS:  None.   INDICATION:  Ms. Cumbee is a 70 year old female, patient of Dr. Caprice Kluver, Dr. Smith Mince with a history of a St. Jude mitral valve repair  in 1998 as well as a history of chronic atrial fibrillation/flutter.  She did undergo a loop recorder insertion after the patient had syncopal  episodes and was noted to have atrial fibrillation rates up to 180 as  well as 3-4 second pauses.  She is now brought for pacer implant to  allow for adequate treatment of her atrial fibrillation.   DESCRIPTION OF PROCEDURE:  After obtaining informed consent, the patient  was brought into the cardiac catheterization laboratory where the left  chest was prepped and draped in the sterile fashion.  ECG monitoring was  established, 1% lidocaine used to anesthetize over the previously  implanted loop recorder.  Next, approximately 1.5 cm incision was  carried out just beneath the prior loop recorder incision.  Blunt  dissection was used to carry this down to the loop capsule.  The capsule  was then opened and the loop recorder was then visualized.  The  anchoring sutures were the freed from the header of the loop device and  then loop was then removed from the pocket.  As previously stated this  was a Medtronic Reveal Plus model [id number], serial  [id number] device.  The  pocket was then inspected and hemostasis was confirmed.  The pocket was  the copiously irrigated with 1% gentamicin solution.  The subcutaneous  layer was then closed with running 2-0 Vicryl. The skin was then closed  using 4-0 Vicryl.   Attention was then turned to the left subclavian area for the implant of  the pacemaker.  The 1% lidocaine was then used to anesthetize the left  mid infraclavicular area.  Approximately 3 cm horizontal mid  infraclavicular incision was then carried out and hemostasis obtained  with electrocautery.  Blunt dissection was used to carried this down to  the left pectoral fascia.  A 4 cm pocket was then created over the left  pectoral fascia and good hemostasis was obtained with electrocautery.  The left subclavian vein was then visualized with contrast  administration and the vein was then entered using Doppler Smart needle.  The guide wire was then advanced into the SVC.  Two silk sutures were  then placed at the base  of the retaining guide wire.  Over the retaining  guide wire, a #7 [place] safe sheath were then easily passed and the  guide wire and dilator were then removed.  Through this, an active 52 cm  Medtronic lead model #4076, serial [id number] was then passed into the  right atrium and we were then able to capture 2 volts.  The screw was  then extended and thresholds determined.  There was an excellent  interior current noted.  R waves were measured at 11.4 millivolts.  Impedance at 993 Ohms.  Threshold in the ventricle was 0.5 volts at 0.4  milliseconds.  Current is 0.6 millivolts.  The 10 volts were negative  for diaphragmatic stimulation.  The lead was then sutured into place  with two silk sutures on the lead.  The pocket was then copiously  irrigated with 1% gentamicin solution.  The lead was then connected to a  Medtronic Adapta generator model [id number], serial [id number] H.  Head  screw was tightened.  The pacing was  confirmed.  A single silk suture in  place in the superior aspect of the pocket.  The generator leads were  then delivered into the pocket and header was screwed to the silk  suture.  Subcutaneous layers were the closed with running 2-0 Vicryl.  The skin was then closed using a 4-0 Vicryl.  Steri-Strips were then  applied about the pacer site as well as the loop removal site.  The  patient was returned to the recovery room in stable condition.   CONCLUSIONS:  1. Successful removal of a Medtronic implantable loop recorder.  2. Implant of a Medtronic Adapta [id number] generator serial [id number] H      with single active ventricular leads.      Darlin Priestly, MD  Electronically Signed     RHM/MEDQ  D:  12/15/2006  T:  12/15/2006  Job:  [id number]   cc:   Talmadge Coventry, M.D.  Thereasa Solo. Little, M.D.

## 2011-02-06 NOTE — Assessment & Plan Note (Signed)
[facility]                           GASTROENTEROLOGY OFFICE NOTE   Carrie [name], DORIN                        MRN:          [id number]  DATE:08/02/2006                            DOB:          September 11, 1941    Maevyn comes in to discuss having a colonoscopic examination. She was advised  by Dr. Smith Mince to follow up on this. She states she has been doing well.  She does have some constipation problems, hemorrhoid problems, but nothing  of great difficulty. She is on Coumadin for mitral valve replacement  therapy. She also takes amiodarone and she takes Digitek, metoprolol, and  Ambien p.r.n. She states she has been doing well as far as her GI system is  concerned. Her last colonoscopic examination only revealed a small polyp and  some moderate severe diverticular disease and a small rectocele. She was  advised to have this repeated in 4-5 years and I told her it was at her  convenience and I thought it should be followed up. I told her that she  should talk to Dr. Caprice Kluver about whether she should be done on or off of  her medication.   Her past medical history reveals she has hypertension as well as the  arrhythmia problem. She has hyperlipidemia, hyperthyroidism. She has had  arthritis, problems with anxiety, depression. She is status post tubal  ligation, appendectomy and the heart valve surgery that was previously  mentioned.   SOCIAL HISTORY:  Really non-contributory. She does not drink or smoke.   REVIEW OF SYSTEMS:  Reveals only the new depression and anxiety problems and  headaches, night sweats and sleeping problems.   PHYSICAL EXAMINATION:  She looks great for her age. She weighs 140. Blood  pressure 124/82. Pulse 64 and regular.  Neck, heart and extremities are all basically unremarkable.   IMPRESSION:  1. Status post colon polyp with moderate severe diverticular disease.  2. Mitral valve replacement, on Coumadin with history of  paroxysmal atrial      fibrillation.  3. Hypertension.  4. Anxiety, depression.  5. Status post tubal ligation.  6. Hyperthyroidism.  7. Hyperlipidemia.     Ulyess Mort, MD  Electronically Signed    SML/MedQ  DD: 08/02/2006  DT: 08/02/2006  Job #: [id number]   cc:   Thereasa Solo. Little, M.D.  Doylene Canning. Ladona Ridgel, MD

## 2011-02-06 NOTE — Procedures (Signed)
EEG NUMBER:  [id number]   ORDERED BY:  Dr. Orlin Hilding   CLINICAL HISTORY:  This is a 70 year old woman being evaluated for syncope.  Medications include amiodarone, Pepcid, diltiazem, Coumadin and lisinopril.   DESCRIPTION OF RECORDING:  This was a routine 17-channel EEG with one  channel devoted to EKG utilizing the International 10/20 lead placement  system. The patient is described as being awake and alert clinically,  however electrographically she appears awake and drowsy. While clearly awake  the background consists of a well-organized, well-developed, well-modulated  9-10 Hz alpha activity which was predominant in the posterior head regions  and reactive to eye opening. During drowsiness there was some attenuation of  the background with decrease frequency and amplitude and some central sharp  activity was seen. Hyperventilation was not performed. Photic stimulation  was performed but did not produce significant change in the background  activity. The EKG monitor reveals an irregularly irregular rhythm compatible  with the patient's clinical history of atrial fibrillation with a rate in  the 80s to mid 90s.   CONCLUSION:  Normal EEG in the waking and drowsy state without seizure  activity or focal abnormality seen during the course today's recording. The  EKG monitor and does reveal atrial fibrillation. Clinical correlation is  recommended.      Catherine A. Orlin Hilding, M.D.  Electronically Signed     ZOX:WRUE  D:  11/10/2005 14:03:43  T:  11/10/2005 22:32:59  Job #:  [id number]   cc:   Thereasa Solo. Little, M.D.  Fax: [phone]

## 2011-07-31 ENCOUNTER — Telehealth: Payer: Self-pay | Admitting: Oncology

## 2011-07-31 NOTE — Telephone Encounter (Signed)
per pof 11/18/2010 called pt number in the system and it has been changed.  will mail appt to pt on 07/31/2011

## 2011-08-25 ENCOUNTER — Telehealth: Payer: Self-pay | Admitting: Oncology

## 2011-08-25 NOTE — Telephone Encounter (Signed)
pts called and r/s missed appts in Oct2012 to jan2013 °

## 2011-09-06 ENCOUNTER — Other Ambulatory Visit: Payer: Self-pay

## 2011-09-06 ENCOUNTER — Inpatient Hospital Stay (HOSPITAL_COMMUNITY)
Admission: EM | Admit: 2011-09-06 | Discharge: 2011-09-30 | DRG: 871 | Disposition: A | Discharge status: Skilled Nursing Facility | Payer: Medicare Other | Attending: Family Medicine | Admitting: Family Medicine

## 2011-09-06 ENCOUNTER — Emergency Department (HOSPITAL_COMMUNITY): Payer: Medicare Other

## 2011-09-06 DIAGNOSIS — I609 Nontraumatic subarachnoid hemorrhage, unspecified: Secondary | ICD-10-CM | POA: Diagnosis present

## 2011-09-06 DIAGNOSIS — R509 Fever, unspecified: Secondary | ICD-10-CM

## 2011-09-06 DIAGNOSIS — K5900 Constipation, unspecified: Secondary | ICD-10-CM | POA: Diagnosis not present

## 2011-09-06 DIAGNOSIS — I10 Essential (primary) hypertension: Secondary | ICD-10-CM | POA: Diagnosis present

## 2011-09-06 DIAGNOSIS — G9340 Encephalopathy, unspecified: Secondary | ICD-10-CM | POA: Diagnosis present

## 2011-09-06 DIAGNOSIS — F411 Generalized anxiety disorder: Secondary | ICD-10-CM | POA: Diagnosis present

## 2011-09-06 DIAGNOSIS — R5381 Other malaise: Secondary | ICD-10-CM | POA: Diagnosis not present

## 2011-09-06 DIAGNOSIS — J129 Viral pneumonia, unspecified: Secondary | ICD-10-CM

## 2011-09-06 DIAGNOSIS — Y831 Surgical operation with implant of artificial internal device as the cause of abnormal reaction of the patient, or of later complication, without mention of misadventure at the time of the procedure: Secondary | ICD-10-CM | POA: Diagnosis not present

## 2011-09-06 DIAGNOSIS — D649 Anemia, unspecified: Secondary | ICD-10-CM | POA: Diagnosis not present

## 2011-09-06 DIAGNOSIS — R6889 Other general symptoms and signs: Secondary | ICD-10-CM | POA: Diagnosis present

## 2011-09-06 DIAGNOSIS — I38 Endocarditis, valve unspecified: Secondary | ICD-10-CM | POA: Diagnosis present

## 2011-09-06 DIAGNOSIS — N390 Urinary tract infection, site not specified: Secondary | ICD-10-CM

## 2011-09-06 DIAGNOSIS — I482 Chronic atrial fibrillation, unspecified: Secondary | ICD-10-CM

## 2011-09-06 DIAGNOSIS — Z954 Presence of other heart-valve replacement: Secondary | ICD-10-CM

## 2011-09-06 DIAGNOSIS — I4891 Unspecified atrial fibrillation: Secondary | ICD-10-CM | POA: Diagnosis present

## 2011-09-06 DIAGNOSIS — M6281 Muscle weakness (generalized): Secondary | ICD-10-CM

## 2011-09-06 DIAGNOSIS — Z952 Presence of prosthetic heart valve: Secondary | ICD-10-CM

## 2011-09-06 DIAGNOSIS — Z7901 Long term (current) use of anticoagulants: Secondary | ICD-10-CM

## 2011-09-06 DIAGNOSIS — A419 Sepsis, unspecified organism: Secondary | ICD-10-CM

## 2011-09-06 DIAGNOSIS — E876 Hypokalemia: Secondary | ICD-10-CM | POA: Diagnosis not present

## 2011-09-06 DIAGNOSIS — Z950 Presence of cardiac pacemaker: Secondary | ICD-10-CM | POA: Diagnosis present

## 2011-09-06 DIAGNOSIS — M129 Arthropathy, unspecified: Secondary | ICD-10-CM | POA: Diagnosis present

## 2011-09-06 DIAGNOSIS — Z79899 Other long term (current) drug therapy: Secondary | ICD-10-CM

## 2011-09-06 DIAGNOSIS — T827XXA Infection and inflammatory reaction due to other cardiac and vascular devices, implants and grafts, initial encounter: Secondary | ICD-10-CM | POA: Diagnosis not present

## 2011-09-06 DIAGNOSIS — E871 Hypo-osmolality and hyponatremia: Secondary | ICD-10-CM | POA: Diagnosis not present

## 2011-09-06 DIAGNOSIS — R52 Pain, unspecified: Secondary | ICD-10-CM

## 2011-09-06 DIAGNOSIS — I330 Acute and subacute infective endocarditis: Secondary | ICD-10-CM | POA: Diagnosis not present

## 2011-09-06 DIAGNOSIS — G4700 Insomnia, unspecified: Secondary | ICD-10-CM | POA: Diagnosis not present

## 2011-09-06 DIAGNOSIS — A403 Sepsis due to Streptococcus pneumoniae: Principal | ICD-10-CM | POA: Diagnosis present

## 2011-09-06 DIAGNOSIS — E785 Hyperlipidemia, unspecified: Secondary | ICD-10-CM | POA: Diagnosis present

## 2011-09-06 HISTORY — DX: Unspecified osteoarthritis, unspecified site: M19.90

## 2011-09-06 HISTORY — DX: Sick sinus syndrome: I49.5

## 2011-09-06 HISTORY — DX: Pneumonia, unspecified organism: J18.9

## 2011-09-06 HISTORY — DX: Anxiety disorder, unspecified: F41.9

## 2011-09-06 HISTORY — DX: Hyperlipidemia, unspecified: E78.5

## 2011-09-06 HISTORY — DX: Unspecified atrial fibrillation: I48.91

## 2011-09-06 HISTORY — DX: Essential (primary) hypertension: I10

## 2011-09-06 LAB — COMPREHENSIVE METABOLIC PANEL
Alkaline Phosphatase: 97 U/L (ref 39–117)
BUN: 27 mg/dL — ABNORMAL HIGH (ref 6–23)
Chloride: 103 mEq/L (ref 96–112)
Creatinine, Ser: 0.96 mg/dL (ref 0.50–1.10)
GFR calc Af Amer: 68 mL/min — ABNORMAL LOW (ref >90)
Glucose, Bld: 159 mg/dL — ABNORMAL HIGH (ref 70–99)
Potassium: 3.8 mEq/L (ref 3.5–5.1)
Total Bilirubin: 1 mg/dL (ref 0.3–1.2)
Total Protein: 8.1 g/dL (ref 6.0–8.3)

## 2011-09-06 LAB — CBC
HCT: 37.1 % (ref 36.0–46.0)
Hemoglobin: 11.7 g/dL — ABNORMAL LOW (ref 12.0–15.0)
MCH: 29 pg (ref 26.0–34.0)
MCH: 29.5 pg (ref 26.0–34.0)
MCHC: 33.4 g/dL (ref 30.0–36.0)
MCHC: 34.6 g/dL (ref 30.0–36.0)
MCV: 86.9 fL (ref 78.0–100.0)
MCV: 85.4 fL (ref 78.0–100.0)
Platelets: 127 10*3/uL — ABNORMAL LOW (ref 150–400)
Platelets: 127 10*3/uL — ABNORMAL LOW (ref 150–400)
RBC: 3.96 MIL/uL (ref 3.87–5.11)
RDW: 15.9 % — ABNORMAL HIGH (ref 11.5–15.5)
WBC: 20.6 10*3/uL — ABNORMAL HIGH (ref 4.0–10.5)

## 2011-09-06 LAB — URINALYSIS, ROUTINE W REFLEX MICROSCOPIC
Glucose, UA: NEGATIVE mg/dL
Leukocytes, UA: NEGATIVE
pH: 6 (ref 5.0–8.0)

## 2011-09-06 LAB — BLOOD GAS, ARTERIAL
Bicarbonate: 21.4 mEq/L (ref 20.0–24.0)
O2 Saturation: 94.7 %
Patient temperature: 102.2
pH, Arterial: 7.463 — ABNORMAL HIGH (ref 7.350–7.400)

## 2011-09-06 LAB — POCT I-STAT TROPONIN I: Troponin i, poc: 0.04 ng/mL (ref 0.00–0.08)

## 2011-09-06 LAB — HEPARIN LEVEL (UNFRACTIONATED): Heparin Unfractionated: 0.1 IU/mL — ABNORMAL LOW (ref 0.30–0.70)

## 2011-09-06 LAB — PHOSPHORUS: Phosphorus: 1.7 mg/dL — ABNORMAL LOW (ref 2.3–4.6)

## 2011-09-06 LAB — LACTIC ACID, PLASMA: Lactic Acid, Venous: 1.1 mmol/L (ref 0.5–2.2)

## 2011-09-06 LAB — DIFFERENTIAL
Basophils Absolute: 0 10*3/uL (ref 0.0–0.1)
Lymphs Abs: 0.8 10*3/uL (ref 0.7–4.0)
Monocytes Absolute: 1.4 10*3/uL — ABNORMAL HIGH (ref 0.1–1.0)
Monocytes Relative: 7 % (ref 3–12)
Neutro Abs: 18.4 10*3/uL — ABNORMAL HIGH (ref 1.7–7.7)

## 2011-09-06 LAB — LIPASE, BLOOD
Lipase: 35 U/L (ref 11–59)
Lipase: 122 U/L — ABNORMAL HIGH (ref 11–59)

## 2011-09-06 LAB — CARDIAC PANEL(CRET KIN+CKTOT+MB+TROPI)
Relative Index: 2 (ref 0.0–2.5)
Total CK: 116 U/L (ref 7–177)

## 2011-09-06 LAB — PROTIME-INR
INR: 2.15 — ABNORMAL HIGH (ref 0.00–1.49)
Prothrombin Time: 24.4 seconds — ABNORMAL HIGH (ref 11.6–15.2)

## 2011-09-06 LAB — MRSA PCR SCREENING: MRSA by PCR: NEGATIVE

## 2011-09-06 MED ORDER — SODIUM CHLORIDE 0.9 % IV SOLN
250.0000 mL | INTRAVENOUS | Status: DC | PRN
  Administered 2011-09-23: 250 mL via INTRAVENOUS

## 2011-09-06 MED ORDER — SODIUM CHLORIDE 0.9 % IV SOLN
INTRAVENOUS | Status: DC
  Administered 2011-09-06 – 2011-09-09 (×3): via INTRAVENOUS
  Administered 2011-09-11 – 2011-09-14 (×3): 20 mL/h via INTRAVENOUS
  Administered 2011-09-15: 20 mL via INTRAVENOUS
  Administered 2011-09-18 – 2011-09-29 (×6): via INTRAVENOUS

## 2011-09-06 MED ORDER — SODIUM PHOSPHATE 3 MMOLE/ML IV SOLN
30.0000 mmol | Freq: Once | INTRAVENOUS | Status: AC
  Administered 2011-09-06: 30 mmol via INTRAVENOUS
  Filled 2011-09-06 (×2): qty 10

## 2011-09-06 MED ORDER — DEXTROSE 5 % IV SOLN
2.0000 g | INTRAVENOUS | Status: DC
  Administered 2011-09-06 – 2011-09-08 (×3): 2 g via INTRAVENOUS
  Filled 2011-09-06 (×3): qty 2

## 2011-09-06 MED ORDER — CHLORHEXIDINE GLUCONATE 0.12 % MT SOLN
15.0000 mL | Freq: Two times a day (BID) | OROMUCOSAL | Status: DC
  Administered 2011-09-06 – 2011-09-10 (×8): 15 mL via OROMUCOSAL
  Filled 2011-09-06 (×10): qty 15

## 2011-09-06 MED ORDER — SODIUM CHLORIDE 0.9 % IV SOLN
INTRAVENOUS | Status: DC
  Administered 2011-09-06: 17:00:00 via INTRAVENOUS

## 2011-09-06 MED ORDER — OSELTAMIVIR PHOSPHATE 75 MG PO CAPS
75.0000 mg | ORAL_CAPSULE | Freq: Two times a day (BID) | ORAL | Status: DC
  Administered 2011-09-06 – 2011-09-07 (×3): 75 mg via ORAL
  Filled 2011-09-06 (×8): qty 1

## 2011-09-06 MED ORDER — ASPIRIN 81 MG PO CHEW
324.0000 mg | CHEWABLE_TABLET | ORAL | Status: AC
Start: 2011-09-06 — End: 2011-09-06
  Administered 2011-09-06: 324 mg via ORAL
  Filled 2011-09-06: qty 1

## 2011-09-06 MED ORDER — DEXTROSE 5 % IV SOLN
1.0000 g | Freq: Once | INTRAVENOUS | Status: AC
  Administered 2011-09-06: 1 g via INTRAVENOUS
  Filled 2011-09-06 (×2): qty 10

## 2011-09-06 MED ORDER — HEPARIN SOD (PORCINE) IN D5W 100 UNIT/ML IV SOLN
1250.0000 [IU]/h | INTRAVENOUS | Status: DC
  Administered 2011-09-06: 850 [IU]/h via INTRAVENOUS
  Administered 2011-09-07: 1250 [IU]/h via INTRAVENOUS
  Filled 2011-09-06 (×3): qty 250

## 2011-09-06 MED ORDER — SODIUM CHLORIDE 0.9 % IV BOLUS (SEPSIS)
1000.0000 mL | Freq: Once | INTRAVENOUS | Status: AC
  Administered 2011-09-06: 1000 mL via INTRAVENOUS

## 2011-09-06 MED ORDER — DILTIAZEM HCL 100 MG IV SOLR
5.0000 mg/h | INTRAVENOUS | Status: DC
  Filled 2011-09-06: qty 100

## 2011-09-06 MED ORDER — ASPIRIN 300 MG RE SUPP: 300.0000 mg | RECTAL | Status: AC

## 2011-09-06 MED ORDER — BIOTENE DRY MOUTH MT LIQD
15.0000 mL | Freq: Two times a day (BID) | OROMUCOSAL | Status: DC
  Administered 2011-09-06 – 2011-09-09 (×6): 15 mL via OROMUCOSAL

## 2011-09-06 MED ORDER — FAMOTIDINE 20 MG PO TABS
20.0000 mg | ORAL_TABLET | Freq: Two times a day (BID) | ORAL | Status: DC
  Administered 2011-09-06 – 2011-09-30 (×48): 20 mg via ORAL
  Filled 2011-09-06 (×54): qty 1

## 2011-09-06 MED ORDER — ACETAMINOPHEN 650 MG RE SUPP
975.0000 mg | Freq: Once | RECTAL | Status: AC
  Administered 2011-09-06: 975 mg via RECTAL

## 2011-09-06 MED ORDER — ACETAMINOPHEN 325 MG RE SUPP
RECTAL | Status: AC
  Filled 2011-09-06: qty 3

## 2011-09-06 MED ORDER — DEXTROSE 5 % IV SOLN
500.0000 mg | INTRAVENOUS | Status: DC
  Administered 2011-09-06 – 2011-09-07 (×2): 500 mg via INTRAVENOUS
  Filled 2011-09-06 (×3): qty 500

## 2011-09-06 MED ORDER — SODIUM CHLORIDE 0.9 % IV BOLUS (SEPSIS)
2000.0000 mL | Freq: Once | INTRAVENOUS | Status: AC
  Administered 2011-09-06: 2000 mL via INTRAVENOUS

## 2011-09-06 NOTE — ED Notes (Signed)
Patient transported to x-ray. ?

## 2011-09-06 NOTE — H&P (Signed)
Patient name: Carrie [name] Medical record number: [id number] Date of birth: Sep 30, 1940 Age: 70 y.o. Gender: female PCP: Geraldo Pitter, MD, Dr Clarene [facility]  Date: 09/06/2011 Reason for Consult: sepsis, flu syndrome, delirium  Referring Physician: DR Lynnette Caffey ER Brief history  Lines/tubes None  Culture data/sepsis markers Flu PCR Influenza A and B. nasopharyngeal swab antibody MRSA PCR Multiplex respiratory virus panel 18 viruses PCR Blood culture Urine culture Serum cortisol level Pro-calcitonin 09/06/2011 is 8 Lactate 09/06/2011 is less than 2  Antibiotics Ceftriaxone 2 g Azithromycin Tamiflu  Best practice Pepcid IV heparin drip for a to fibrillation  Protocols/consults None  Events/studies  HPI:  The history is obtained from Dr. Chancy Milroy  triad hospitalist who had eyeball the patient, , emergency room records and in talking to the 2 sisters and the patient. This is a 70 year old female with a atrial fibrillation, mitral valve replacement, status post pacemaker and history of normal ejection fraction and normal coronaries in 2004. Nonsmoker. Nonalcoholic. Completely functional at baseline. No no diabetes or renal problems. She says she's had a flu shot this season. But 3 weeks ago had flulike symptoms and was exhausted and bedridden but according to the sisters had recovered. 4 days ago did shopping at [employer] and was noticed to be fine. The following day she developed fever, exhaustion and had been in bed. The sisters could not reach her. Last night he spoke to the husband and the could not talk the patient and they were concerned. This morning they went to see the patient and noted she was ill and brought her to the emergency room.  In the emergency room she was febrile 104 Fahrenheit, tachycardic to 130, high white count and therefore meeting SIRS criteria. Pro-calcitonin elevated. Urine analysis possibly abnormal. Chest x-ray with some abnormalities suggestive  of viral pneumonitis or increased interstitial prominence. She's received 1 g Rocephin. When Dr. Rito Ehrlich evaluated her he noticed her to be confused with the family admitted and therefore PCCM and service admitting the patient  Past Medical History  Diagnosis Date  . Hypertension   . Atrial fibrillation     normal coroonaries cath 2004. Dr Clarene [facility]   . Sick sinus syndrome     Dr Sharrell Ku. EP study negative for inducibel arrythmia. ? pacer since 2007  . Hyperlipidemia   . Arthritis   . Anxiety   . Diverticula of colon   . Pneumonia 2009    resolved.? OPD Rx    Past Surgical History  Procedure Date  . Pacemaker insertion   . Mitral valve replacement 1998    St Jude  . Appendectomy   . Tubal ligation     No family history on file.  Social History:  reports that she has never smoked. She does not have any smokeless tobacco history on file. She reports that she does not drink alcohol or use illicit drugs. active in church. Sister is Ms. Stanfield and Tama Gander at her bedside. Sister wonders if she has cancer.  Allergies:  Allergies  Allergen Reactions  . Codeine Nausea And Vomiting    Medications:  Prior to Admission medications   Medication Sig Start Date End Date Taking? Authorizing Provider  digoxin (LANOXIN) 0.25 MG tablet Take 250 mcg by mouth daily.     Yes Historical Provider, MD  diltiazem (CARDIZEM LA) 300 MG 24 hr tablet Take 360 mg by mouth daily.     Yes Historical Provider, MD  famotidine (PEPCID) 20 MG tablet Take  20 mg by mouth 2 (two) times daily.     Yes Historical Provider, MD  losartan (COZAAR) 50 MG tablet Take 50 mg by mouth daily.     Yes Historical Provider, MD  metoprolol tartrate (LOPRESSOR) 25 MG tablet Take 25 mg by mouth 2 (two) times daily.     Yes Historical Provider, MD  warfarin (COUMADIN) 5 MG tablet Take 5 mg by mouth daily.     Yes Historical Provider, MD   Review of systems: Positive for confusion, exhaustion, fever, flulike  symptoms but negative for dyspnea shortness of breath cough edema and hemoptysis and loss of consciousness or chest pain  Temp:  [101.2 F (38.4 C)-104.8 F (40.4 C)] 102.2 F (39 C) (12/16 1412) Pulse Rate:  [115-133] 115  (12/16 1412) Resp:  [18-30] 18  (12/16 1412) BP: (136-139)/(70-108) 139/108 mmHg (12/16 1412) SpO2:  [97 %] 97 % (12/16 1412)   No intake or output data in the 24 hours ending 09/06/11 1531 Physical exam BP 139/108  Pulse 115  Temp(Src) 102.2 F (39 C) (Rectal)  Resp 18  SpO2 97%  General Appearance:    Alert, cooperative, looks unwell, looks older than stated age   Head:    Normocephalic, without obvious abnormality, atraumatic  Eyes:    PERRL, conjunctiva/corneas clear, EOM's intact, fundi    benign, both eyes  Ears:    Normal TM's and external ear canals, both ears  Nose:   Nares normal, septum midline, mucosa normal, no drainage    or sinus tenderness  Throat:   Lips, mucosa, and tongue normal; teeth and gums normal but looks dehydrated   Neck:   Supple, symmetrical, trachea midline, no adenopathy;    thyroid:  no enlargement/tenderness/nodules; no carotid   bruit or JVD  Back:     Symmetric, no curvature, ROM normal, no CVA tenderness  Lungs:     Clear to auscultation bilaterally, respirations unlabored  Chest Wall:    No tenderness or deformity   Heart:    Regular rate and rhythm, S1 and S2 normal, no murmur, rub   or gallop  Breast Exam:    No tenderness, masses, or nipple abnormality  Abdomen:     Soft, non-tender, bowel sounds active all four quadrants,    no masses, no organomegaly  Genitalia:    Normal female without lesion, discharge or tenderness  Rectal:    Normal tone, normal prostate, no masses or tenderness;   guaiac negative stool  Extremities:   Extremities normal, atraumatic, no cyanosis or edema  Pulses:   2+ and symmetric all extremities  Skin:   Skin color, texture, turgor normal, no rashes or lesions  Lymph nodes:   Cervical,  supraclavicular, and axillary nodes normal  Neurologic:   CNII-XII intact, normal strength, sensation and reflexes    Throughout but she is confused. RAS sedation score is +1     radiology   Dg Chest 2 View  09/06/2011  *RADIOLOGY REPORT*  Clinical Data: Rule out infiltrate. Confusion.  CHEST - 2 VIEW  Comparison: 02/20/2008  Findings:  Heart size appears mildly enlarged.  Prior median sternotomy CABG procedure.  Left chest wall pacer device is noted with lead in the right ventricle.  There are no pleural effusions or interstitial edema.  Chronic bronchitic changes are noted bilaterally.  IMPRESSION:  1.  Cardiac enlargement. 2.  No acute changes.  Original Report Authenticated By: Rosealee Albee, M.D.   However I think that the chest x-ray shows  prominent interstitial markings compared to her prior chest x-ray in 2009  LAB RESULT Lab Results  Component Value Date   CREATININE 0.96 09/06/2011   BUN 27* 09/06/2011   NA 135 09/06/2011   K 3.8 09/06/2011   CL 103 09/06/2011   CO2 21 09/06/2011   Lab Results  Component Value Date   WBC 20.6* 09/06/2011   HGB 12.4 09/06/2011   HCT 37.1 09/06/2011   MCV 86.9 09/06/2011   PLT 127* 09/06/2011   Lab Results  Component Value Date   ALT 16 09/06/2011   AST 46* 09/06/2011   ALKPHOS 97 09/06/2011   BILITOT 1.0 09/06/2011   Lab Results  Component Value Date   INR 2.15* 09/06/2011   INR 3.3 RATIO* 09/06/2006   Normal lactate but elevated [place] on  Assessment and Plan  Principal Problem:  *Sepsis Active Problems:  Encephalopathy acute  Flu-like symptoms  UTI (lower urinary tract infection)  Atrial fibrillation with rapid ventricular response  S/P mitral valve replacement  Pneumonia  She meets criteria for systemic inflammatory response syndrome. She is septic as evidenced by high pro-calcitonin. Currently no evidence of occult septic shock of frank septic shock due to normal lactate a normal blood pressure. She is  delirious and has acute encephalopathy this is possibly from her sepsis syndrome. Source is likely pneumonia following flulike illness. Urinary tract infection is another possibility. Per family she has improved in the emergency room with hydration and antibiotics so far  Aggressively fluid hydrate Cover for urinary tract infection and community-acquired pneumonia with ceftriaxone and azithromycin Cover for potential fluids Tamiflu Check sepsis biomarkers, tract them Check respiratory virus and influenza virus panel be a PCR Check pancultures Check echocardiogram per record it has been several years since prior evaluation Depending on course consult [facility]: Family interested in this anyways Rule out MI IV heparin for mitral valve the pharmacy protocol Pepcid for stress ulcer prophylaxis Monitor CAM-ICU delirium test  Updated family that she could deteriorate and in which case she would need central line in intubation they're agreeable for this patient is full code   [facility] 09/06/2011, 3:31 PM     The patient is critically ill with multiple organ systems failure and requires high complexity decision making for assessment and support, frequent evaluation and titration of therapies, application of advanced monitoring technologies and extensive interpretation of multiple databases.   Critical Care Time devoted to patient care services described in this note is  60  minutes.

## 2011-09-06 NOTE — Progress Notes (Signed)
Replaced phos

## 2011-09-06 NOTE — ED Notes (Signed)
[id number]<AV> Expected date:<BR> Expected time:<BR> Means of arrival:<BR> Comments:<BR> EMS N/V 70yo

## 2011-09-06 NOTE — ED Notes (Signed)
Per ems, pt from home c/o nausea and vomiting x 3 days. No vomiting occurred yet today.

## 2011-09-06 NOTE — Progress Notes (Signed)
ANTICOAGULATION CONSULT NOTE - Initial Consult  Pharmacy Consult for IV heparin Indication: mechanical mitral valve and h/o afib  Allergies  Allergen Reactions  . Codeine Nausea And Vomiting    Patient Measurements:  Patient's reported wt = 61 kg  IBW = 48 kg  Heparin dosing weight = 61 kg   Vital Signs: Temp: 104.8 F (40.4 C) (12/16 1114) Temp src: Rectal (12/16 1114) BP: 136/70 mmHg (12/16 1059) Pulse Rate: 133  (12/16 1114)  Labs:  Basename 09/06/11 1200  HGB 12.4  HCT 37.1  PLT 127*  APTT --  LABPROT 24.4*  INR 2.15*  HEPARINUNFRC --  CREATININE 0.96  CKTOTAL --  CKMB --  TROPONINI --   The CrCl is unknown because both a height and weight (above a minimum accepted value) are required for this calculation.  Medical History: Past Medical History  Diagnosis Date  . Hypertension   . Atrial fibrillation   . Sick sinus syndrome   . Hyperlipidemia   . Arthritis   . Anxiety   . Diverticula of colon     Medications:  Scheduled:    . acetaminophen      . acetaminophen  975 mg Rectal Once  . cefTRIAXone (ROCEPHIN)  IV  1 g Intravenous Once  . sodium chloride  1,000 mL Intravenous Once   Infusions:    . sodium chloride 75 mL/hr at 09/06/11 1148  . DISCONTD: diltiazem (CARDIZEM) infusion      Assessment:  60 YOF with mechanical mitral valve and h/o atrial fibrillation on coumadin PTA.  INR 2.15 on admission with order to start heparin IV  INR goal is 2.5 to 3.5, coumadin has not yet been resumed   Will initiate heparin at 14 units/kg  Goal of Therapy:  Heparin level 0.3-0.7 units/ml INR 2.5-3.5   Plan:   No heparin bolus given elevated INR   Start Heparin drip at 850 units/hr  Obtain heparin level 6 hours after initiation of heparin  Daily heparin level and CBC   Follow coumadin restart if not contraindicated (no MD notes in Epic at the moment)  Geoffry Paradise Thi 09/06/2011,2:10 PM

## 2011-09-06 NOTE — ED Provider Notes (Signed)
History     CSN: [id number] Arrival date & time: 09/06/2011 11:10 AM   Chief Complaint  Patient presents with  . Fever  . Cough  . Nausea  . Emesis    HPI Pt was seen at 1125.  Per pt and family, c/o gradual onset and persistence of constant generalized weakness/fatigue and home fevers/chills x3-4 days, as well as several episodes of N/V since yesterday.  Husband states pt was "coughing a lot" approx 2-3 weeks ago, but that improved.  Denies CP/SOB, no abd pain, no diarrhea, no rash.    Past Medical History  Diagnosis Date  . Hypertension   . Atrial fibrillation   . Sick sinus syndrome   . Hyperlipidemia   . Arthritis   . Anxiety   . Diverticula of colon     Past Surgical History  Procedure Date  . Pacemaker insertion   . Mitral valve replacement   . Appendectomy   . Tubal ligation      History  Substance Use Topics  . Smoking status: Never Smoker   . Smokeless tobacco: Not on file  . Alcohol Use: No   Review of Systems ROS: Statement: All systems negative except as marked or noted in the HPI; Constitutional: +fever and chills, generalized weakness/fatigue. ; Eyes: Negative for eye pain, redness and discharge. ; ; ENMT: Negative for ear pain, hoarseness, nasal congestion, sinus pressure and sore throat. ; ; Cardiovascular: Negative for chest pain, palpitations, diaphoresis, dyspnea and peripheral edema. ; ; Respiratory: +cough.  Negative for wheezing and stridor. ; ; Gastrointestinal: +N/V.  Negative for diarrhea, abdominal pain, blood in stool, hematemesis, jaundice and rectal bleeding. . ; ; Genitourinary: Negative for dysuria, flank pain and hematuria. ; ; Musculoskeletal: Negative for back pain and neck pain. Negative for swelling and trauma.; ; Skin: Negative for pruritus, rash, abrasions, blisters, bruising and skin lesion.; ; Neuro: Negative for headache, lightheadedness and neck stiffness. Negative for altered level of consciousness , altered mental status,  extremity weakness, paresthesias, involuntary movement, seizure and syncope.     Allergies  Codeine  Home Medications   Current Outpatient Rx  Name Route Sig Dispense Refill  . DIGOXIN 0.25 MG PO TABS Oral Take 250 mcg by mouth daily.      Marland Kitchen DILTIAZEM HCL ER COATED BEADS 300 MG PO TB24 Oral Take 360 mg by mouth daily.      Marland Kitchen FAMOTIDINE 20 MG PO TABS Oral Take 20 mg by mouth 2 (two) times daily.      Marland Kitchen LOSARTAN POTASSIUM 50 MG PO TABS Oral Take 50 mg by mouth daily.      Marland Kitchen METOPROLOL TARTRATE 25 MG PO TABS Oral Take 25 mg by mouth 2 (two) times daily.      . WARFARIN SODIUM 5 MG PO TABS Oral Take 5 mg by mouth daily.        BP 136/70  Pulse 133  Temp(Src) 104.8 F (40.4 C) (Rectal)  Resp 30  SpO2 97%  Physical Exam 1130: Physical examination:  Nursing notes reviewed; Vital signs and O2 SAT reviewed;  Constitutional: Well developed, Well nourished, In no acute distress; Head:  Normocephalic, atraumatic; Eyes: EOMI, PERRL, No scleral icterus; ENMT: Mouth and pharynx normal, Mucous membranes dry; Neck: Supple, Full range of motion, No lymphadenopathy, no meningeal signs; Cardiovascular: Tachycardia, irregular irregular rate and rhythm, +valve click, No murmur or gallop; Respiratory: Breath sounds clear & equal bilaterally, No rales, rhonchi, wheezes, or rub, Normal respiratory effort/excursion; Chest: Nontender, Movement  normal; Abdomen: Soft, Nontender, Nondistended, Normal bowel sounds; Extremities: Pulses normal, No tenderness, No edema, No calf edema or asymmetry.; Neuro: AA&Ox3, mildly confused re: events; Major CN grossly intact. No facial droop, speech clear.  Moves all ext well on stretcher without apparent gross focal motor deficits.; Skin: Color normal, Warm, Dry, no rash.    ED Course  Procedures    MDM  MDM Reviewed: nursing note and vitals Reviewed previous: ECG Interpretation: ECG, labs and x-ray   CRITICAL CARE Performed by: Laray Anger Total critical care  time: 35 Critical care time was exclusive of separately billable procedures and treating other patients. Critical care was necessary to treat or prevent imminent or life-threatening deterioration. Critical care was time spent personally by me on the following activities: development of treatment plan with patient and/or surrogate as well as nursing, discussions with consultants, evaluation of patient's response to treatment, examination of patient, obtaining history from patient or surrogate, ordering and performing treatments and interventions, ordering and review of laboratory studies, ordering and review of radiographic studies, pulse oximetry and re-evaluation of patient's condition.   Date: 09/06/2011  Rate: 127  Rhythm: atrial fibrillation  QRS Axis: normal  Intervals: normal  ST/T Wave abnormalities: nonspecific ST/T changes  Conduction Disutrbances:none  Narrative Interpretation:   Old EKG Reviewed: changes noted, rate slower on previous EKG dated 12/14/2006.  Results for orders placed during the hospital encounter of 09/06/11  LACTIC ACID, PLASMA      Component Value Range   Lactic Acid, Venous 2.0  0.5 - 2.2 (mmol/L)  CBC      Component Value Range   WBC 20.6 (*) 4.0 - 10.5 (K/uL)   RBC 4.27  3.87 - 5.11 (MIL/uL)   Hemoglobin 12.4  12.0 - 15.0 (g/dL)   HCT 11.9  14.7 - 82.9 (%)   MCV 86.9  78.0 - 100.0 (fL)   MCH 29.0  26.0 - 34.0 (pg)   MCHC 33.4  30.0 - 36.0 (g/dL)   RDW 56.2 (*) 13.0 - 15.5 (%)   Platelets 127 (*) 150 - 400 (K/uL)  DIFFERENTIAL      Component Value Range   Neutrophils Relative 89 (*) 43 - 77 (%)   Lymphocytes Relative 4 (*) 12 - 46 (%)   Monocytes Relative 7  3 - 12 (%)   Eosinophils Relative 0  0 - 5 (%)   Basophils Relative 0  0 - 1 (%)   Neutro Abs 18.4 (*) 1.7 - 7.7 (K/uL)   Lymphs Abs 0.8  0.7 - 4.0 (K/uL)   Monocytes Absolute 1.4 (*) 0.1 - 1.0 (K/uL)   Eosinophils Absolute 0.0  0.0 - 0.7 (K/uL)   Basophils Absolute 0.0  0.0 - 0.1 (K/uL)    WBC Morphology VACUOLATED NEUTROPHILS     Smear Review LARGE PLATELETS PRESENT    COMPREHENSIVE METABOLIC PANEL      Component Value Range   Sodium 135  135 - 145 (mEq/L)   Potassium 3.8  3.5 - 5.1 (mEq/L)   Chloride 103  96 - 112 (mEq/L)   CO2 21  19 - 32 (mEq/L)   Glucose, Bld 159 (*) 70 - 99 (mg/dL)   BUN 27 (*) 6 - 23 (mg/dL)   Creatinine, Ser 8.65  0.50 - 1.10 (mg/dL)   Calcium 9.2  8.4 - 78.4 (mg/dL)   Total Protein 8.1  6.0 - 8.3 (g/dL)   Albumin 2.9 (*) 3.5 - 5.2 (g/dL)   AST 46 (*) 0 - 37 (U/L)  ALT 16  0 - 35 (U/L)   Alkaline Phosphatase 97  39 - 117 (U/L)   Total Bilirubin 1.0  0.3 - 1.2 (mg/dL)   GFR calc non Af Amer 59 (*) >90 (mL/min)   GFR calc Af Amer 68 (*) >90 (mL/min)  LIPASE, BLOOD      Component Value Range   Lipase 35  11 - 59 (U/L)  URINALYSIS, ROUTINE W REFLEX MICROSCOPIC      Component Value Range   Color, Urine YELLOW  YELLOW    APPearance CLOUDY (*) CLEAR    Specific Gravity, Urine 1.018  1.005 - 1.030    pH 6.0  5.0 - 8.0    Glucose, UA NEGATIVE  NEGATIVE (mg/dL)   Hgb urine dipstick LARGE (*) NEGATIVE    Bilirubin Urine NEGATIVE  NEGATIVE    Ketones, ur NEGATIVE  NEGATIVE (mg/dL)   Protein, ur >413 (*) NEGATIVE (mg/dL)   Urobilinogen, UA 1.0  0.0 - 1.0 (mg/dL)   Nitrite NEGATIVE  NEGATIVE    Leukocytes, UA NEGATIVE  NEGATIVE   POCT I-STAT TROPONIN I      Component Value Range   Troponin i, poc 0.04  0.00 - 0.08 (ng/mL)   Comment 3           PROTIME-INR      Component Value Range   Prothrombin Time 24.4 (*) 11.6 - 15.2 (seconds)   INR 2.15 (*) 0.00 - 1.49   PROCALCITONIN      Component Value Range   Procalcitonin 7.65    URINE MICROSCOPIC-ADD ON      Component Value Range   Squamous Epithelial / LPF RARE  RARE    RBC / HPF 0-2  <3 (RBC/hpf)   Bacteria, UA MANY (*) RARE   POCT I-STAT TROPONIN I      Component Value Range   Troponin i, poc 0.26 (*) 0.00 - 0.08 (ng/mL)   Comment NOTIFIED PHYSICIAN     Comment 3            Dg Chest 2  View  09/06/2011  *RADIOLOGY REPORT*  Clinical Data: Rule out infiltrate. Confusion.  CHEST - 2 VIEW  Comparison: 02/20/2008  Findings:  Heart size appears mildly enlarged.  Prior median sternotomy CABG procedure.  Left chest wall pacer device is noted with lead in the right ventricle.  There are no pleural effusions or interstitial edema.  Chronic bronchitic changes are noted bilaterally.  IMPRESSION:  1.  Cardiac enlargement. 2.  No acute changes.  Original Report Authenticated By: Rosealee Albee, M.D.    1340:   Tachycardia improved to 110's from 130's after IVF and APAP.  Will re-check VS/temp.  Elevated HR likely due to fever.  BC x2 and UC obtained.  Will start IV rocephin for UTI.  Troponin elevated, but likely r/t sepsis.  Hx afib on coumadin, INR therapeutic.  Dx testing d/w pt and family.  Questions answered.  Verb understanding, agreeable to admit.  T/C to [facility] Dr. Rito Ehrlich, case discussed, including:  HPI, pertinent PM/SHx, VS/PE, dx testing, ED course and treatment.  Requests to obtain ICU bed, he will talk with PCCM MD re: Critical Care MD vs [facility] MD admit.          Jaquae Rieves Allison Quarry, DO 09/07/11 2103

## 2011-09-06 NOTE — Progress Notes (Signed)
ANTICOAGULATION CONSULT NOTE - Initial Consult  Pharmacy Consult for IV heparin Indication: mechanical mitral valve and h/o afib  Allergies  Allergen Reactions  . Codeine Nausea And Vomiting    Patient Measurements: Height: 5\' 1"  (154.9 cm) Weight: 133 lb 6.1 oz (60.5 kg) IBW/kg (Calculated) : 47.8 Patient's reported wt = 61 kg  IBW = 48 kg  Heparin dosing weight = 61 kg   Vital Signs: Temp: 100.6 F (38.1 C) (12/16 1700) Temp src: Oral (12/16 1700) BP: 149/92 mmHg (12/16 2000) Pulse Rate: 148  (12/16 2000)  Labs:  Basename 09/06/11 2005 09/06/11 1200  HGB 11.7* 12.4  HCT 33.8* 37.1  PLT 127* 127*  APTT -- --  LABPROT -- 24.4*  INR -- 2.15*  HEPARINUNFRC 0.10* --  CREATININE -- 0.96  CKTOTAL 116 --  CKMB 2.3 --  TROPONINI <0.30 --   Estimated Creatinine Clearance: 46.2 ml/min (by C-G formula based on Cr of 0.96).  Medical History: Past Medical History  Diagnosis Date  . Hypertension   . Atrial fibrillation     normal coroonaries cath 2004. Dr Clarene [facility]   . Sick sinus syndrome     Dr Sharrell Ku. EP study negative for inducibel arrythmia. ? pacer since 2007  . Hyperlipidemia   . Arthritis   . Anxiety   . Diverticula of colon   . Pneumonia 2009    resolved.? OPD Rx    Medications:  Scheduled:     . acetaminophen      . acetaminophen  975 mg Rectal Once  . antiseptic oral rinse  15 mL Mouth Rinse q12n4p  . aspirin  324 mg Oral NOW   Or  . aspirin  300 mg Rectal NOW  . azithromycin  500 mg Intravenous Q24H  . cefTRIAXone (ROCEPHIN)  IV  1 g Intravenous Once  . cefTRIAXone (ROCEPHIN)  IV  2 g Intravenous Q24H  . chlorhexidine  15 mL Mouth Rinse BID  . famotidine  20 mg Oral BID  . oseltamivir  75 mg Oral BID  . sodium chloride  1,000 mL Intravenous Once  . sodium chloride  2,000 mL Intravenous Once   Infusions:     . sodium chloride 75 mL/hr at 09/06/11 1148  . sodium chloride 150 mL/hr at 09/06/11 1639  . heparin 8.5 mL/hr (09/06/11 2000)   . DISCONTD: diltiazem (CARDIZEM) infusion      Assessment:  63 YOF with mechanical mitral valve and h/o atrial fibrillation on coumadin PTA.  INR 2.15 on admission with order to start heparin IV  INR goal is 2.5 to 3.5, coumadin has not yet been resumed   First Heparin Level subtherapeutic at 0.10  Goal of Therapy:  Heparin level 0.3-0.7 units/ml INR 2.5-3.5   Plan:   No heparin bolus given elevated INR   Increase heparin drip to 1100 units/hr IV.  Obtain heparin level 6 hours following change in rate.  Daily heparin level and CBC   Follow coumadin restart if not contraindicated (no MD notes in Epic at the moment)  [name] 09/06/2011,9:22 PM

## 2011-09-07 ENCOUNTER — Inpatient Hospital Stay (HOSPITAL_COMMUNITY): Payer: Medicare Other

## 2011-09-07 ENCOUNTER — Encounter (HOSPITAL_COMMUNITY): Payer: Self-pay | Admitting: Radiology

## 2011-09-07 LAB — INFLUENZA PANEL BY PCR (TYPE A & B)
H1N1 flu by pcr: NOT DETECTED
Influenza A By PCR: NEGATIVE
Influenza B By PCR: NEGATIVE

## 2011-09-07 LAB — HEPARIN LEVEL (UNFRACTIONATED)
Heparin Unfractionated: 0.18 IU/mL — ABNORMAL LOW (ref 0.30–0.70)
Heparin Unfractionated: 0.48 IU/mL (ref 0.30–0.70)

## 2011-09-07 LAB — BASIC METABOLIC PANEL
Chloride: 104 mEq/L (ref 96–112)
GFR calc non Af Amer: >90 mL/min (ref >90)
Glucose, Bld: 134 mg/dL — ABNORMAL HIGH (ref 70–99)
Potassium: 3 mEq/L — ABNORMAL LOW (ref 3.5–5.1)
Sodium: 136 mEq/L (ref 135–145)

## 2011-09-07 LAB — CBC
Hemoglobin: 13.5 g/dL (ref 12.0–15.0)
MCHC: 35.2 g/dL (ref 30.0–36.0)
RBC: 4.51 MIL/uL (ref 3.87–5.11)
WBC: 24.5 10*3/uL — ABNORMAL HIGH (ref 4.0–10.5)

## 2011-09-07 LAB — LEGIONELLA ANTIGEN, URINE: Legionella Antigen, Urine: NEGATIVE

## 2011-09-07 LAB — TSH: TSH: 0.453 u[IU]/mL (ref 0.350–4.500)

## 2011-09-07 LAB — STREP PNEUMONIAE URINARY ANTIGEN: Strep Pneumo Urinary Antigen: NEGATIVE

## 2011-09-07 LAB — PHOSPHORUS: Phosphorus: 2.3 mg/dL (ref 2.3–4.6)

## 2011-09-07 MED ORDER — IOHEXOL 300 MG/ML  SOLN
100.0000 mL | Freq: Once | INTRAMUSCULAR | Status: AC | PRN
  Administered 2011-09-07: 100 mL via INTRAVENOUS

## 2011-09-07 MED ORDER — FENTANYL CITRATE 0.05 MG/ML IJ SOLN
INTRAMUSCULAR | Status: AC
  Administered 2011-09-07: 50 ug via INTRAVENOUS
  Filled 2011-09-07: qty 2

## 2011-09-07 MED ORDER — IOHEXOL 350 MG/ML SOLN
100.0000 mL | Freq: Once | INTRAVENOUS | Status: AC | PRN
  Administered 2011-09-07: 100 mL via INTRAVENOUS

## 2011-09-07 MED ORDER — METOPROLOL TARTRATE 25 MG/10 ML ORAL SUSPENSION
25.0000 mg | Freq: Two times a day (BID) | ORAL | Status: DC
  Administered 2011-09-07 – 2011-09-09 (×5): 25 mg via ORAL
  Filled 2011-09-07 (×11): qty 10

## 2011-09-07 MED ORDER — FENTANYL CITRATE 0.05 MG/ML IJ SOLN
50.0000 ug | INTRAMUSCULAR | Status: DC | PRN
Start: 2011-09-07 — End: 2011-09-10
  Administered 2011-09-07 – 2011-09-10 (×13): 50 ug via INTRAVENOUS
  Filled 2011-09-07 (×14): qty 2

## 2011-09-07 MED ORDER — VANCOMYCIN HCL 500 MG IV SOLR
500.0000 mg | Freq: Two times a day (BID) | INTRAVENOUS | Status: DC
  Administered 2011-09-07 – 2011-09-09 (×4): 500 mg via INTRAVENOUS
  Filled 2011-09-07 (×5): qty 500

## 2011-09-07 MED ORDER — POTASSIUM CHLORIDE CRYS ER 20 MEQ PO TBCR
40.0000 meq | EXTENDED_RELEASE_TABLET | ORAL | Status: AC
  Administered 2011-09-07 (×2): 40 meq via ORAL
  Filled 2011-09-07: qty 2
  Filled 2011-09-07 (×2): qty 1

## 2011-09-07 NOTE — Progress Notes (Signed)
eLink Physician-Brief Progress Note Patient Name: Carrie [name] DOB: Aug 17, 1941 MRN: [id number]  Date of Service  09/07/2011   HPI/Events of Note  C/o pain, no pain meds ordered  eICU Interventions  See orders for Iv fentanyl prn    Intervention Category Intermediate Interventions: Pain - evaluation and management  [name] 09/07/2011, 5:52 PM

## 2011-09-07 NOTE — Progress Notes (Signed)
  Echocardiogram 2D Echocardiogram has been performed.  Juanita Laster Cartel Mauss, RDCS 09/07/2011, 9:56 AM

## 2011-09-07 NOTE — Consult Note (Signed)
Reason for Consult: Altered mental status Referring Physician: Dr. Danise Mina  Carrie [name] is an 70 y.o. female.  HPI: The patient is a 70 year old right-handed black female with a two-week history of fevers and malaise. Patient also reports some night sweats, and some difficulty with ambulation the patient denies any significant issues with headaches, but she does note some troubles with blurred vision, and some neck pain posteriorly. The patient also reports some low back pain, and abdominal pain, and some nausea and vomiting. Patient reports some problems with diarrhea. Patient indicates that she's been coughing up mucus with a greenish tint. Patient denies any focal numbness or weakness of the face, arms, or legs. The patient is being admitted for the fevers, with a high white count in the 25,000 range, and the patient has a history of a mitral valve replacement, atrial fibrillation, and chronic Coumadin therapy. The Coumadin is therapeutic at this time. A CT scan of the brain was done, and this shows small tiny left frontal cortical subarachnoid hemorrhages. The CT scan of the neck questions a right internal carotid artery dissection, but it is felt that the findings may be artifact. Neurology is asked to see this patient for an evaluation. A 2-D echocardiogram does not show vegetations on the heart valves, with enlargement of both atria. Tricuspid regurgitation is seen. The patient reports some issues with gait instability, but denies any falls or head trauma.  Past Medical History  Diagnosis Date  . Hypertension   . Atrial fibrillation     normal coroonaries cath 2004. Dr Clarene Duke [facility]   . Sick sinus syndrome     Dr Sharrell Ku. EP study negative for inducibel arrythmia. ? pacer since 2007  . Hyperlipidemia   . Arthritis   . Anxiety   . Diverticula of colon   . Pneumonia 2009    resolved.? OPD Rx    Past Surgical History  Procedure Date  . Pacemaker insertion   . Mitral valve  replacement 1998    [employer]  . Appendectomy   . Tubal ligation     No family history on file.  Social History:  reports that she has never smoked. She does not have any smokeless tobacco history on file. She reports that she does not drink alcohol or use illicit drugs.  Allergies:  Allergies  Allergen Reactions  . Codeine Nausea And Vomiting    Medications:  Scheduled:   . antiseptic oral rinse  15 mL Mouth Rinse q12n4p  . azithromycin  500 mg Intravenous Q24H  . cefTRIAXone (ROCEPHIN)  IV  2 g Intravenous Q24H  . chlorhexidine  15 mL Mouth Rinse BID  . famotidine  20 mg Oral BID  . metoprolol tartrate  25 mg Oral BID  . oseltamivir  75 mg Oral BID  . potassium chloride  40 mEq Oral Q4H  . sodium phosphate  Dextrose 5% IVPB  30 mmol Intravenous Once  . vancomycin  500 mg Intravenous Q12H   Continuous:   . sodium chloride 75 mL/hr at 09/06/11 1148  . DISCONTD: sodium chloride 150 mL/hr at 09/06/11 1639  . DISCONTD: heparin Stopped (09/07/11 2000)   BJY:NWGNFA chloride, fentaNYL, iohexol  Results for orders placed during the hospital encounter of 09/06/11 (from the past 48 hour(s))  LACTIC ACID, PLASMA     Status: Normal   Collection Time   09/06/11 11:40 AM      Component Value Range Comment   Lactic Acid, Venous 2.0  0.5 -  2.2 (mmol/L)   CBC     Status: Abnormal   Collection Time   09/06/11 12:00 PM      Component Value Range Comment   WBC 20.6 (*) 4.0 - 10.5 (K/uL)    RBC 4.27  3.87 - 5.11 (MIL/uL)    Hemoglobin 12.4  12.0 - 15.0 (g/dL)    HCT 16.1  09.6 - 04.5 (%)    MCV 86.9  78.0 - 100.0 (fL)    MCH 29.0  26.0 - 34.0 (pg)    MCHC 33.4  30.0 - 36.0 (g/dL)    RDW 40.9 (*) 81.1 - 15.5 (%)    Platelets 127 (*) 150 - 400 (K/uL)   DIFFERENTIAL     Status: Abnormal   Collection Time   09/06/11 12:00 PM      Component Value Range Comment   Neutrophils Relative 89 (*) 43 - 77 (%)    Lymphocytes Relative 4 (*) 12 - 46 (%)    Monocytes Relative 7  3 - 12 (%)      Eosinophils Relative 0  0 - 5 (%)    Basophils Relative 0  0 - 1 (%)    Neutro Abs 18.4 (*) 1.7 - 7.7 (K/uL)    Lymphs Abs 0.8  0.7 - 4.0 (K/uL)    Monocytes Absolute 1.4 (*) 0.1 - 1.0 (K/uL)    Eosinophils Absolute 0.0  0.0 - 0.7 (K/uL)    Basophils Absolute 0.0  0.0 - 0.1 (K/uL)    WBC Morphology VACUOLATED NEUTROPHILS      Smear Review LARGE PLATELETS PRESENT     COMPREHENSIVE METABOLIC PANEL     Status: Abnormal   Collection Time   09/06/11 12:00 PM      Component Value Range Comment   Sodium 135  135 - 145 (mEq/L)    Potassium 3.8  3.5 - 5.1 (mEq/L)    Chloride 103  96 - 112 (mEq/L)    CO2 21  19 - 32 (mEq/L)    Glucose, Bld 159 (*) 70 - 99 (mg/dL)    BUN 27 (*) 6 - 23 (mg/dL)    Creatinine, Ser 9.14  0.50 - 1.10 (mg/dL)    Calcium 9.2  8.4 - 10.5 (mg/dL)    Total Protein 8.1  6.0 - 8.3 (g/dL)    Albumin 2.9 (*) 3.5 - 5.2 (g/dL)    AST 46 (*) 0 - 37 (U/L)    ALT 16  0 - 35 (U/L)    Alkaline Phosphatase 97  39 - 117 (U/L)    Total Bilirubin 1.0  0.3 - 1.2 (mg/dL)    GFR calc non Af Amer 59 (*) >90 (mL/min)    GFR calc Af Amer 68 (*) >90 (mL/min)   LIPASE, BLOOD     Status: Normal   Collection Time   09/06/11 12:00 PM      Component Value Range Comment   Lipase 35  11 - 59 (U/L)   PROTIME-INR     Status: Abnormal   Collection Time   09/06/11 12:00 PM      Component Value Range Comment   Prothrombin Time 24.4 (*) 11.6 - 15.2 (seconds)    INR 2.15 (*) 0.00 - 1.49    PROCALCITONIN     Status: Normal   Collection Time   09/06/11 12:00 PM      Component Value Range Comment   Procalcitonin 7.65     URINALYSIS, ROUTINE W REFLEX MICROSCOPIC     Status:  Abnormal   Collection Time   09/06/11 12:02 PM      Component Value Range Comment   Color, Urine YELLOW  YELLOW     APPearance CLOUDY (*) CLEAR     Specific Gravity, Urine 1.018  1.005 - 1.030     pH 6.0  5.0 - 8.0     Glucose, UA NEGATIVE  NEGATIVE (mg/dL)    Hgb urine dipstick LARGE (*) NEGATIVE     Bilirubin  Urine NEGATIVE  NEGATIVE     Ketones, ur NEGATIVE  NEGATIVE (mg/dL)    Protein, ur >161 (*) NEGATIVE (mg/dL)    Urobilinogen, UA 1.0  0.0 - 1.0 (mg/dL)    Nitrite NEGATIVE  NEGATIVE     Leukocytes, UA NEGATIVE  NEGATIVE    URINE MICROSCOPIC-ADD ON     Status: Abnormal   Collection Time   09/06/11 12:02 PM      Component Value Range Comment   Squamous Epithelial / LPF RARE  RARE     RBC / HPF 0-2  <3 (RBC/hpf)    Bacteria, UA MANY (*) RARE    POCT I-STAT TROPONIN I     Status: Normal   Collection Time   09/06/11 12:16 PM      Component Value Range Comment   Troponin i, poc 0.04  0.00 - 0.08 (ng/mL)    Comment 3            POCT I-STAT TROPONIN I     Status: Abnormal   Collection Time   09/06/11  1:22 PM      Component Value Range Comment   Troponin i, poc 0.26 (*) 0.00 - 0.08 (ng/mL)    Comment NOTIFIED PHYSICIAN      Comment 3            CULTURE, BLOOD (ROUTINE X 2)     Status: Normal (Preliminary result)   Collection Time   09/06/11  1:40 PM      Component Value Range Comment   Specimen Description BLOOD RIGHT HAND      Special Requests NONE [facility] EACH      Setup Time [id number]      Culture        Value: GRAM POSITIVE COCCI IN CHAINS     Note: Gram Stain Report Called to,Read Back By and Verified With: Eden Emms RN on 09/06/16 at 05:10 by Christie Nottingham   Report Status PENDING     BLOOD GAS, ARTERIAL     Status: Abnormal   Collection Time   09/06/11  2:03 PM      Component Value Range Comment   O2 Content 1.5      Delivery systems NASAL CANNULA      pH, Arterial 7.463 (*) 7.350 - 7.400     pCO2 arterial 31.0 (*) 35.0 - 45.0 (mmHg)    pO2, Arterial 74.2 (*) 80.0 - 100.0 (mmHg)    Bicarbonate 21.4  20.0 - 24.0 (mEq/L)    TCO2 18.8  0 - 100 (mmol/L)    Acid-base deficit 0.4  0.0 - 2.0 (mmol/L)    O2 Saturation 94.7      Patient temperature 102.2      Collection site LEFT BRACHIAL      Drawn by [phone]      Sample type ARTERIAL DRAW     CULTURE, BLOOD (ROUTINE X 2)      Status: Normal (Preliminary result)   Collection Time   09/06/11  4:00 PM  Component Value Range Comment   Specimen Description BLOOD RIGHT ARM      Special Requests        Value: Normal BOTTLES DRAWN AEROBIC AND ANAEROBIC 5CC EACH   Setup Time [phone]      Culture        Value:        BLOOD CULTURE RECEIVED NO GROWTH TO DATE CULTURE WILL BE HELD FOR 5 DAYS BEFORE ISSUING A FINAL NEGATIVE REPORT   Report Status PENDING     CULTURE, BLOOD (ROUTINE X 2)     Status: Normal (Preliminary result)   Collection Time   09/06/11  4:10 PM      Component Value Range Comment   Specimen Description BLOOD STAT BOTTLES DRAWN AEROBIC ONLY RIGHT HAND      Special Requests Normal 5CC       Setup Time [id number]      Culture        Value:        BLOOD CULTURE RECEIVED NO GROWTH TO DATE CULTURE WILL BE HELD FOR 5 DAYS BEFORE ISSUING A FINAL NEGATIVE REPORT   Report Status PENDING     MRSA PCR SCREENING     Status: Normal   Collection Time   09/06/11  6:46 PM      Component Value Range Comment   MRSA by PCR NEGATIVE  NEGATIVE    STREP PNEUMONIAE URINARY ANTIGEN     Status: Normal   Collection Time   09/06/11  7:47 PM      Component Value Range Comment   Strep Pneumo Urinary Antigen NEGATIVE  NEGATIVE    LEGIONELLA ANTIGEN, URINE     Status: Normal   Collection Time   09/06/11  7:48 PM      Component Value Range Comment   Specimen Description URINE, CATHETERIZED      Special Requests NONE      Legionella Antigen, Urine Negative for Legionella pneumophilia serogroup 1      Report Status 09/07/2011 FINAL     HEPARIN LEVEL (UNFRACTIONATED)     Status: Abnormal   Collection Time   09/06/11  8:05 PM      Component Value Range Comment   Heparin Unfractionated 0.10 (*) 0.30 - 0.70 (IU/mL)   AMYLASE     Status: Abnormal   Collection Time   09/06/11  8:05 PM      Component Value Range Comment   Amylase 157 (*) 0 - 105 (U/L)   CBC     Status: Abnormal   Collection Time   09/06/11  8:05  PM      Component Value Range Comment   WBC 24.5 (*) 4.0 - 10.5 (K/uL)    RBC 3.96  3.87 - 5.11 (MIL/uL)    Hemoglobin 11.7 (*) 12.0 - 15.0 (g/dL)    HCT 40.1 (*) 02.7 - 46.0 (%)    MCV 85.4  78.0 - 100.0 (fL)    MCH 29.5  26.0 - 34.0 (pg)    MCHC 34.6  30.0 - 36.0 (g/dL)    RDW 25.3 (*) 66.4 - 15.5 (%)    Platelets 127 (*) 150 - 400 (K/uL)   CARDIAC PANEL(CRET KIN+CKTOT+MB+TROPI)     Status: Normal   Collection Time   09/06/11  8:05 PM      Component Value Range Comment   Total CK 116  7 - 177 (U/L)    CK, MB 2.3  0.3 - 4.0 (ng/mL)    Troponin I <0.30  <  0.30 (ng/mL)    Relative Index 2.0  0.0 - 2.5    PHOSPHORUS     Status: Abnormal   Collection Time   09/06/11  8:05 PM      Component Value Range Comment   Phosphorus 1.7 (*) 2.3 - 4.6 (mg/dL)   MAGNESIUM     Status: Normal   Collection Time   09/06/11  8:05 PM      Component Value Range Comment   Magnesium 2.0  1.5 - 2.5 (mg/dL)   LIPASE, BLOOD     Status: Abnormal   Collection Time   09/06/11  8:05 PM      Component Value Range Comment   Lipase 122 (*) 11 - 59 (U/L)   LACTIC ACID, PLASMA     Status: Normal   Collection Time   09/06/11  8:05 PM      Component Value Range Comment   Lactic Acid, Venous 1.1  0.5 - 2.2 (mmol/L)   TSH     Status: Normal   Collection Time   09/06/11  8:05 PM      Component Value Range Comment   TSH 0.453  0.350 - 4.500 (uIU/mL)   CORTISOL     Status: Normal   Collection Time   09/06/11  8:05 PM      Component Value Range Comment   Cortisol, Plasma 45.6     INFLUENZA PANEL BY PCR     Status: Normal   Collection Time   09/06/11  8:09 PM      Component Value Range Comment   Influenza A By PCR NEGATIVE  NEGATIVE     Influenza B By PCR NEGATIVE  NEGATIVE     H1N1 flu by pcr NOT DETECTED  NOT DETECTED    CBC     Status: Abnormal   Collection Time   09/07/11  3:20 AM      Component Value Range Comment   WBC 24.5 (*) 4.0 - 10.5 (K/uL)    RBC 4.51  3.87 - 5.11 (MIL/uL)    Hemoglobin  13.5  12.0 - 15.0 (g/dL)    HCT 69.6  29.5 - 28.4 (%)    MCV 84.9  78.0 - 100.0 (fL)    MCH 29.9  26.0 - 34.0 (pg)    MCHC 35.2  30.0 - 36.0 (g/dL)    RDW 13.2 (*) 44.0 - 15.5 (%)    Platelets 120 (*) 150 - 400 (K/uL)   BASIC METABOLIC PANEL     Status: Abnormal   Collection Time   09/07/11  3:20 AM      Component Value Range Comment   Sodium 136  135 - 145 (mEq/L)    Potassium 3.0 (*) 3.5 - 5.1 (mEq/L) DELTA CHECK NOTED   Chloride 104  96 - 112 (mEq/L)    CO2 21  19 - 32 (mEq/L)    Glucose, Bld 134 (*) 70 - 99 (mg/dL)    BUN 11  6 - 23 (mg/dL) DELTA CHECK NOTED   Creatinine, Ser 0.59  0.50 - 1.10 (mg/dL) DELTA CHECK NOTED   Calcium 8.7  8.4 - 10.5 (mg/dL)    GFR calc non Af Amer >90  >90 (mL/min)    GFR calc Af Amer >90  >90 (mL/min)   MAGNESIUM     Status: Normal   Collection Time   09/07/11  3:20 AM      Component Value Range Comment   Magnesium 1.8  1.5 - 2.5 (mg/dL)  PHOSPHORUS     Status: Normal   Collection Time   09/07/11  3:20 AM      Component Value Range Comment   Phosphorus 2.3  2.3 - 4.6 (mg/dL)   HEPARIN LEVEL (UNFRACTIONATED)     Status: Abnormal   Collection Time   09/07/11  3:30 AM      Component Value Range Comment   Heparin Unfractionated 0.18 (*) 0.30 - 0.70 (IU/mL)   HEPARIN LEVEL (UNFRACTIONATED)     Status: Normal   Collection Time   09/07/11  1:41 PM      Component Value Range Comment   Heparin Unfractionated 0.48  0.30 - 0.70 (IU/mL)     Dg Chest 2 View  09/06/2011  *RADIOLOGY REPORT*  Clinical Data: Rule out infiltrate. Confusion.  CHEST - 2 VIEW  Comparison: 02/20/2008  Findings:  Heart size appears mildly enlarged.  Prior median sternotomy CABG procedure.  Left chest wall pacer device is noted with lead in the right ventricle.  There are no pleural effusions or interstitial edema.  Chronic bronchitic changes are noted bilaterally.  IMPRESSION:  1.  Cardiac enlargement. 2.  No acute changes.  Original Report Authenticated By: Rosealee Albee,  M.D.   Ct Head Wo Contrast  09/07/2011  *RADIOLOGY REPORT*  Clinical Data: Headaches  CT HEAD WITHOUT CONTRAST  Technique:  Contiguous axial images were obtained from the base of the skull through the vertex without contrast.  Comparison: [place] [facility] brain dated 11/10/2005  Findings: Tiny hyperdense foci overlying the left frontal lobe (series 2/images 14 and 22) and left medial frontal lobe (series 2/image 19), possibly reflecting subarachnoid hemorrhage.  No mass lesion, mass effect, or midline shift.  No CT evidence of acute infarction.  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  Subcortical white matter and periventricular small vessel ischemic changes.  Mildly age related atrophy.  No ventriculomegaly.  The visualized paranasal sinuses are essentially clear. The mastoid air cells are unopacified.  No evidence of calvarial fracture.  IMPRESSION: Possible tiny foci of subarachnoid hemorrhage overlying the left frontal lobe, as described above.  Old lacunar infarct with small vessel ischemic changes  Critical Value/emergent results were called by telephone at the time of interpretation on 09/07/2011  at 1810 hours  to  Dr Delford Field, who verbally acknowledged these results.  Original Report Authenticated By: Charline Bills, M.D.   Ct Cervical Spine W Contrast  09/07/2011  *RADIOLOGY REPORT*  Clinical Data: C-spine tenderness, evaluate for fracture or abscess  CT CERVICAL SPINE WITH CONTRAST  Technique:  Multidetector CT imaging of the cervical spine was performed during intravenous contrast administration. Multiplanar CT image reconstructions were also generated.  Contrast: OMNIPAQUE IOHEXOL 300 MG/ML IV SOLN  Comparison: None.  Findings: Normal cervical lordosis.  No evidence of fracture or dislocation.  Vertebral body heights are maintained.  The dens appears intact.  No prevertebral soft tissue swelling.  Mild multilevel degenerative changes.  No epidural abscess/fluid collection is seen.  There  is a possible linear filling defect within the right common carotid artery (series 606/image 69, series 605/image 4), and although this may reflect artifact at the thoracic inlet, a focal dissection cannot be excluded.  IMPRESSION: Apparent linear filling defect within the right common carotid artery, possibly artifactual, although focal dissection cannot be excluded.  Consider CTA/MRA neck for further characterization as clinically warranted.  No evidence of fracture or dislocation.  No epidural abscess/fluid collection is seen.  Critical Value/emergent results were called by telephone at  the time of interpretation on 09/07/2011  at 1810 hours  to  Dr Delford Field, who verbally acknowledged these results.  Original Report Authenticated By: Charline Bills, M.D.    @ROS @  The patient does note a history of fevers and chills, night sweats  The patient reports neck pain and some low back pain  The patient does note some shortness of breath, cough that is productive.  Patient reports some abdominal pain and nausea and vomiting. The patient reports diarrhea.  The patient reports dizziness and gait instability. The patient denies any blackout episodes. The patient has had some confusion earlier. The patient denies focal numbness or weakness of the face, arms, or legs.   Blood pressure 108/55, pulse 95, temperature 100.8 F (38.2 C), temperature source Axillary, resp. rate 35, height 5\' 1"  (1.549 m), weight 60.5 kg (133 lb 6.1 oz), SpO2 94.00%.  Physical Examination:  General:  The patient is alert and cooperative at the time of the examination. The patient is oriented x2. The patient recalls 3 out of 3 words at 5 minutes. Patient was confused as to the month.  Respiratory:  Lungs fields are clear to auscultation bilaterally.  Cardiovascular:  Examination reveals an irregular rate and rhythm, a valve click was noted.  Abdominal Exam:  Abdomen is soft and nontender, positive bowel sounds are  noted. No organomegaly is noted.  Extremities:  Extremities are without significant edema.    Neurologic Examination  Cranial Nerves:  Facial symmetry is present. Pupils are equal, round, and reactive to light. Visual fields are full. Speech is normal, no aphasia or dysarthria is noted. Pin prick sensation on the face is symmetric and normal.  Motor Examination: Motor testing of all 4 extremities reveals normal strength of the arms. The patient has difficulty elevating both legs off of the bed, with some proximal weakness.  Sensory Examination: Sensory examination of the arms and legs shows normal pinprick, soft touch, and vibration sensation throughout.  Cerebellar Examination: The patient has good finger-nose-finger bilaterally. Due to 2 weakness, the patient had difficulty performing heel-to-shin bilaterally. Gait was not tested.  Deep Tendon Reflexes: Deep tendon reflexes are symmetric and normal throughout. Toes are downgoing bilaterally.    Assessment/Plan:  1. History of altered mental status, with some resolution  2. Abnormal CT the brain, left frontal small subarachnoid hemorrhages  3. Febrile illness  4. Atrial fibrillation on anticoagulation  5. Mitral valve replacement  6. Cardiac pacemaker placement  The patient is unable to have an MRI of the brain secondary to the pacemaker placement. The patient is on Coumadin currently, with minimal mental status changes. Given the high fever, white count, artificial valve, and small subarachnoid hemorrhages in the brain, would be concerned about cardiogenic emboli to the brain, possibly septic. The patient has had malaise for 2 weeks prior to admission. There is some question of a right carotid dissection, but this may be artifactual. The patient will be set up for a CTA of the head and neck. A CT of the brain can be repeated in one or 2 days. Blood cultures are pending. Cardiology input concerning whether or not a TEE may be  helpful in discerning whether septic emboli are present or not may be reasonable if bacterial endocarditis is a diagnostic possibility.  Manisha Cancel KEITH 09/07/2011, 8:18 PM

## 2011-09-07 NOTE — Progress Notes (Signed)
eLink Physician-Brief Progress Note Patient Name: Carrie [name] DOB: [birth year] MRN: [id number]  Date of Service  09/07/2011   HPI/Events of Note  CT head shows frontal SAH small ? Flap in L carotid artery ?dissection  eICU Interventions  Dr Anne Hahn of neurology was consulted       [name] 09/07/2011, 7:29 PM

## 2011-09-07 NOTE — Progress Notes (Addendum)
Patient name: Carrie [name] Medical record number: [id number] Date of birth: [birth year] Age: 70 y.o. Gender: female PCP: Geraldo Pitter, MD, Dr Clarene [facility]  Date: 09/07/2011 Reason for Consult: sepsis, flu syndrome, delirium  Referring Physician: DR Lynnette Caffey ER Brief history  70 year old female who presented on 12/16 with 3 day history of fever and decreased energy. In the emergency room she was febrile 104 Fahrenheit, tachycardic to 130, high white count and therefore meeting SIRS criteria. Pro-calcitonin elevated. Urine analysis possibly abnormal. Chest x-ray with some abnormalities suggestive of viral pneumonitis or increased interstitial prominence. She's received 1 g Rocephin.  PCCM admitted for concern of sepsis. (of note had flue like symptoms ~3 weeks before which she had improved from).   Lines/tubes None  Culture data/sepsis markers Legionella antigen 12/17: negative Urine strep 12/17: negative Flu PCR: negative Influenza A and B. nasopharyngeal swab antibody >>NEG MRSA PCR: negative Multiplex respiratory virus panel 18 viruses PCR 09/06/11 >> Blood culture 12/17: GPC inchains>>> Urine culture 12/17>>> Pro-calcitonin 09/06/2011 is 8 Lactate 09/06/2011 is less than 2  Antibiotics Ceftriaxone 2 g 12/16>>> Azithromycin 12/16>>> Tamiflu 12/16>>>\ Vancomycin 12/17 >>  Best practice Pepcid IV heparin drip for a to fibrillation  Protocols/consults None  Events/studies Echo 12/17: Left ventricle: Systolic function was vigorous. The estimated ejection fraction was in the range of 65% to 70%. Although no diagnostic regional wall motion abnormality was identified, this possibility cannot be completely excluded on the basis of this study. Mitral prosthesis and arrhythmia prevent evaluation of LV diastolic function.  Subjective  Objective Temp:  [98.3 F (36.8 C)-102.2 F (39 C)] 101.3 F (38.5 C) (12/17 1200) Pulse Rate:  [57-148] 57  (12/17 1400) Resp:   [18-33] 21  (12/17 1400) BP: (116-175)/(51-108) 125/56 mmHg (12/17 1400) SpO2:  [90 %-97 %] 97 % (12/17 1400) Weight:  [60.5 kg (133 lb 6.1 oz)] 133 lb 6.1 oz (60.5 kg) (12/16 1700)    Intake/Output Summary (Last 24 hours) at 09/07/11 1410 Last data filed at 09/07/11 1400  Gross per 24 hour  Intake 5551.25 ml  Output   2625 ml  Net 2926.25 ml   Physical exam Temp:  [98.3 F (36.8 C)-101.3 F (38.5 C)] 101.3 F (38.5 C) (12/17 1200) Pulse Rate:  [57-148] 57  (12/17 1400) Resp:  [20-33] 21  (12/17 1400) BP: (116-175)/(51-93) 125/56 mmHg (12/17 1400) SpO2:  [90 %-97 %] 97 % (12/17 1400) Weight:  [60.5 kg (133 lb 6.1 oz)] 133 lb 6.1 oz (60.5 kg) (12/16 1700)  General: 70 year old female currently confused with generalized pain. Specifically over neck and C spine.  HENT: mucous membrane moist, no JVD. She is tender to palpation of posterior C spine area but can easily bend neck without discomfort.  Pulm: clear, no accessory muscle use Card: irregular  irregular Abd: non-tender, no organomegally + bowel sounds GU: foley cath to straight drain Neuro: awake, oriented to person and place but still confused.   radiology  pcxr: 12/16: right basilar prominent interstitial markings.   LAB RESULT  Lab 09/07/11 0320 09/06/11 1200  NA 136 135  K 3.0* 3.8  CL 104 103  CO2 21 21  BUN 11 27*  CREATININE 0.59 0.96  GLUCOSE 134* 159*    Lab 09/07/11 0320 09/06/11 2005 09/06/11 1200  HGB 13.5 11.7* 12.4  HCT 38.3 33.8* 37.1  WBC 24.5* 24.5* 20.6*  PLT 120* 127* 127*    Assessment and Plan  SIRS/ Sepsis, etiology is unclear. She does have  one isolated BC that is growing GPC (may be contamination), also consider CAP (NOS) or in light of her C Spine discomfort Worry about spinal abscess or meningitis.   Lab 09/07/11 0320 09/06/11 2005 09/06/11 1200  WBC 24.5* 24.5* 20.6*  Plan: -cont current antibiotics -follow up on pending culture data -keep euvolemic -Needs CT of C spine and  T spine as well as CT head.  -further interventions based on CT findings.   Encephalopathy acute: in setting of sepsis. Possible encephalitis Plan: -CT head -CT neck -follow up culture data -supportive care  Atrial fibrillation with rapid ventricular response.  Plan: -keep euvolemic -resume lopressor for now, may also need to resume cardiazem  S/P mitral valve replacement Lab Results  Component Value Date   INR 2.15* 09/06/2011   INR 3.3 RATIO* 09/06/2006  Plan: -cont heparin/coumadin unless CT suggests abnormality requiring intervention.   Hypokalemia  Lab 09/07/11 0320 09/06/11 1200  K 3.0* 3.8  plan: -replace and recheck    BABCOCK,PETE 09/07/2011, 2:10 PM    STAFF NOTE: I, Dr Lavinia Sharps have personally reviewed patient's available data, including medical history, events of note, physical examination and test results as part of my evaluation. I have discussed with NP and other care providers such as pharmacist, RN and RRT.  In addition,  I personally evaluated patient and elicited key findings of those she has not deteriorated requiring pressors intubation her mild encephalopathy continues, the fever continues although a little bit better. The new finding appears to be cervical spine tenderness and gram-positive cocci from the blood culture. Given the tenderness we will get CT scan of the neck and head. We will also get further blood cultures during temperature spikes as part of endocarditis evaluation although the insensitive 2-D echocardiogram does not show any endocarditis. Will add vancomycin because WBC still high and unchanged. We'll continue to keep her in intensive care unit to all these issues are resolved. Currently glucose to call [facility] consultation 09/08/2011 so there on board with a atrial fibrillation.  Rest per NP/medical resident whose note is outlined above and that I agree with  The patient is critically ill with  multiple organ systems failure and requires high complexity decision making for assessment and support, frequent evaluation and titration of therapies, application of advanced monitoring technologies and extensive interpretation of multiple databases.   Critical Care Time devoted to patient care services described in this note is  35  minutes.

## 2011-09-07 NOTE — Progress Notes (Signed)
ANTIBIOTIC CONSULT NOTE - INITIAL  Pharmacy Consult for Vancomcyin Indication: GPC in 1/2 blood culture  Allergies  Allergen Reactions  . Codeine Nausea And Vomiting    Patient Measurements: Height: 5\' 1"  (154.9 cm) Weight: 133 lb 6.1 oz (60.5 kg) IBW/kg (Calculated) : 47.8    Vital Signs: Temp: 100.8 F (38.2 C) (12/17 1600) Temp src: Axillary (12/17 1600) BP: 108/55 mmHg (12/17 1700) Pulse Rate: 95  (12/17 1700) Intake/Output from previous day: 12/16 0701 - 12/17 0700 In: 4516.3 [I.V.:2915.3; IV Piggyback:1601] Out: 1860 [Urine:1860] Intake/Output from this shift: Total I/O In: 2145 [P.O.:300; I.V.:1545; IV Piggyback:300] Out: 955 [Urine:955]  Labs:  [facility] 09/07/11 0320 09/06/11 2005 09/06/11 1200  WBC 24.5* 24.5* 20.6*  HGB 13.5 11.7* 12.4  PLT 120* 127* 127*  LABCREA -- -- --  CREATININE 0.59 -- 0.96   Estimated Creatinine Clearance: 55.4 ml/min (by C-G formula based on Cr of 0.59). No results found for this basename: VANCOTROUGH:2,VANCOPEAK:2,VANCORANDOM:2,GENTTROUGH:2,GENTPEAK:2,GENTRANDOM:2,TOBRATROUGH:2,TOBRAPEAK:2,TOBRARND:2,AMIKACINPEAK:2,AMIKACINTROU:2,AMIKACIN:2, in the last 72 hours   Microbiology: Recent Results (from the past 720 hour(s))  CULTURE, BLOOD (ROUTINE X 2)     Status: Normal (Preliminary result)   Collection Time   09/06/11  1:40 PM      Component Value Range Status Comment   Specimen Description BLOOD RIGHT HAND   Final    Special Requests NONE [facility]   Final    Setup Time [id number]   Final    Culture     Final    Value: GRAM POSITIVE COCCI IN CHAINS     Note: Gram Stain Report Called to,Read Back By and Verified With: Eden Emms RN on 09/06/16 at 05:10 by Christie Nottingham   Report Status PENDING   Incomplete   CULTURE, BLOOD (ROUTINE X 2)     Status: Normal (Preliminary result)   Collection Time   09/06/11  4:00 PM      Component Value Range Status Comment   Specimen Description BLOOD RIGHT ARM   Final    Special Requests      Final    Value: Normal BOTTLES DRAWN AEROBIC AND ANAEROBIC 5CC EACH   Setup Time [id number]   Final    Culture     Final    Value:        BLOOD CULTURE RECEIVED NO GROWTH TO DATE CULTURE WILL BE HELD FOR 5 DAYS BEFORE ISSUING A FINAL NEGATIVE REPORT   Report Status PENDING   Incomplete   CULTURE, BLOOD (ROUTINE X 2)     Status: Normal (Preliminary result)   Collection Time   09/06/11  4:10 PM      Component Value Range Status Comment   Specimen Description BLOOD STAT BOTTLES DRAWN AEROBIC ONLY RIGHT HAND   Final    Special Requests Normal 5CC    Final    Setup Time [id number]   Final    Culture     Final    Value:        BLOOD CULTURE RECEIVED NO GROWTH TO DATE CULTURE WILL BE HELD FOR 5 DAYS BEFORE ISSUING A FINAL NEGATIVE REPORT   Report Status PENDING   Incomplete   MRSA PCR SCREENING     Status: Normal   Collection Time   09/06/11  6:46 PM      Component Value Range Status Comment   MRSA by PCR NEGATIVE  NEGATIVE  Final     Medical History: Past Medical History  Diagnosis Date  . Hypertension   . Atrial fibrillation  normal coroonaries cath 2004. Dr Claren[facility]   . Sick sinus syndrome     Dr Sharrell Ku. EP study negative for inducibel arrythmia. ? pacer since 2007  . Hyperlipidemia   . Arthritis   . Anxiety   . Diverticula of colon   . Pneumonia 2009    resolved.? OPD Rx    Medications:  Scheduled:    . antiseptic oral rinse  15 mL Mouth Rinse q12n4p  . azithromycin  500 mg Intravenous Q24H  . cefTRIAXone (ROCEPHIN)  IV  2 g Intravenous Q24H  . chlorhexidine  15 mL Mouth Rinse BID  . famotidine  20 mg Oral BID  . metoprolol tartrate  25 mg Oral BID  . oseltamivir  75 mg Oral BID  . potassium chloride  40 mEq Oral Q4H  . sodium chloride  2,000 mL Intravenous Once  . sodium phosphate  Dextrose 5% IVPB  30 mmol Intravenous Once   Assessment: 70YOF to begin vancomycin for empiric treatment for GPC in chains found in 1/2 blood cultures.  This is  possibly a contamination; however, adding empiric vancomycin to current antibiotics (azithromycin, ceftriaxone) considering unknown etiology of SIRS presentation.  Of note, patient also has hardware (metal mitral valve replacement, pacemaker). CrCl ~36ml/min ([place]-Gault using SCr 1.0 for >65YO).  Goal of Therapy:  Vancomycin trough level 15-20 mcg/ml  Plan:  Vancomycin 500mg  IV q12h. Will f/u blood culture results, monitor SCr for dose adjustments, and will obtain trough at steady state.  [name] 09/07/2011,5:30 PM

## 2011-09-07 NOTE — Progress Notes (Signed)
ANTICOAGULATION CONSULT NOTE - Follow Up Consult  Pharmacy Consult for Heparin Indication: atrial fibrillation  Allergies  Allergen Reactions  . Codeine Nausea And Vomiting    Patient Measurements: Height: 5\' 1"  (154.9 cm) Weight: 133 lb 6.1 oz (60.5 kg) IBW/kg (Calculated) : 47.8  Adjusted Body Weight:   Vital Signs: Temp: 98.3 F (36.8 C) (12/17 0000) Temp src: Oral (12/17 0000) BP: 175/71 mmHg (12/17 0300) Pulse Rate: 109  (12/17 0300)  Labs:  Basename 09/07/11 0330 09/07/11 0320 09/06/11 2005 09/06/11 1200  HGB -- 13.5 11.7* --  HCT -- 38.3 33.8* 37.1  PLT -- 120* 127* 127*  APTT -- -- -- --  LABPROT -- -- -- 24.4*  INR -- -- -- 2.15*  HEPARINUNFRC 0.18* -- 0.10* --  CREATININE -- 0.59 -- 0.96  CKTOTAL -- -- 116 --  CKMB -- -- 2.3 --  TROPONINI -- -- <0.30 --   Estimated Creatinine Clearance: 55.4 ml/min (by C-G formula based on Cr of 0.59).    Assessment: Patient on heparin drip for bridge and has low heparin level.  No issues/bleeding per RN.   Goal of Therapy:  Heparin level 0.3-0.7 units/ml   Plan:  Increase heparin to 1250units/hr, recheck level at 1200.  Darlina Guys, Jacquenette Shone Crowford 09/07/2011,4:52 AM

## 2011-09-07 NOTE — Progress Notes (Signed)
ANTICOAGULATION CONSULT NOTE - Follow Up Consult  Pharmacy Consult for Heparin Indication: atrial fibrillation, mitral valve replacement  Allergies  Allergen Reactions  . Codeine Nausea And Vomiting    Patient Measurements: Height: 5\' 1"  (154.9 cm) Weight: 133 lb 6.1 oz (60.5 kg) IBW/kg (Calculated) : 47.8   Vital Signs: Temp: 101.3 F (38.5 C) (12/17 1200) Temp src: Axillary (12/17 1200) BP: 125/56 mmHg (12/17 1400) Pulse Rate: 57  (12/17 1400)  Labs:  Basename 09/07/11 1341 09/07/11 0330 09/07/11 0320 09/06/11 2005 09/06/11 1200  HGB -- -- 13.5 11.7* --  HCT -- -- 38.3 33.8* 37.1  PLT -- -- 120* 127* 127*  APTT -- -- -- -- --  LABPROT -- -- -- -- 24.4*  INR -- -- -- -- 2.15*  HEPARINUNFRC 0.48 0.18* -- 0.10* --  CREATININE -- -- 0.59 -- 0.96  CKTOTAL -- -- -- 116 --  CKMB -- -- -- 2.3 --  TROPONINI -- -- -- <0.30 --   Estimated Creatinine Clearance: 55.4 ml/min (by C-G formula based on Cr of 0.59).    Assessment: 70 yo F Patient on heparin drip for warfarin bridge while INR is subtherapeutic due to warfarin on hold. No bleeding reported. Heparin level therapeutic at 0.48  Goal of Therapy:  Heparin level 0.3-0.7 units/ml   Plan:  Continue Heparin 1250units/hr, recheck level with AM labs. Follow daily labs, heparin level and CBC Await orders to resume warfarin when appropriate.  Lynann Beaver PharmD  Pager [phone] 09/07/2011 3:15 PM

## 2011-09-07 NOTE — Progress Notes (Signed)
CRITICAL VALUE ALERT  Critical value received:  Positive Blood cultures in all 4 bottles (Gram + cocci in chains)  Date of notification:  09/07/11 Time of notification:  05:10  Critical value read back: yes  Nurse who received alert:  Stephanie Coup  MD notified (1st page):  Mignon Pine  Time of first page:  5:15  MD notified (2nd page):  Time of second page:  Responding MD:  Craige Cotta  Time MD responded:  [phone]

## 2011-09-08 ENCOUNTER — Inpatient Hospital Stay (HOSPITAL_COMMUNITY): Payer: Medicare Other

## 2011-09-08 DIAGNOSIS — G9340 Encephalopathy, unspecified: Secondary | ICD-10-CM

## 2011-09-08 DIAGNOSIS — A403 Sepsis due to Streptococcus pneumoniae: Secondary | ICD-10-CM

## 2011-09-08 DIAGNOSIS — Z950 Presence of cardiac pacemaker: Secondary | ICD-10-CM | POA: Diagnosis present

## 2011-09-08 DIAGNOSIS — I609 Nontraumatic subarachnoid hemorrhage, unspecified: Secondary | ICD-10-CM | POA: Diagnosis present

## 2011-09-08 DIAGNOSIS — J189 Pneumonia, unspecified organism: Secondary | ICD-10-CM

## 2011-09-08 DIAGNOSIS — M6281 Muscle weakness (generalized): Secondary | ICD-10-CM | POA: Insufficient documentation

## 2011-09-08 DIAGNOSIS — I4891 Unspecified atrial fibrillation: Secondary | ICD-10-CM

## 2011-09-08 LAB — BASIC METABOLIC PANEL
GFR calc Af Amer: >90 mL/min (ref >90)
GFR calc non Af Amer: >90 mL/min (ref >90)
Potassium: 3 mEq/L — ABNORMAL LOW (ref 3.5–5.1)
Sodium: 135 mEq/L (ref 135–145)

## 2011-09-08 LAB — ABO/RH: ABO/RH(D): A POS

## 2011-09-08 LAB — CBC
Platelets: 120 10*3/uL — ABNORMAL LOW (ref 150–400)
RDW: 15.6 % — ABNORMAL HIGH (ref 11.5–15.5)
WBC: 21.1 10*3/uL — ABNORMAL HIGH (ref 4.0–10.5)

## 2011-09-08 LAB — TYPE AND SCREEN: ABO/RH(D): A POS

## 2011-09-08 LAB — PROTIME-INR
INR: 2.79 — ABNORMAL HIGH (ref 0.00–1.49)
Prothrombin Time: 29.9 seconds — ABNORMAL HIGH (ref 11.6–15.2)

## 2011-09-08 LAB — PROCALCITONIN: Procalcitonin: 3.7 ng/mL

## 2011-09-08 LAB — URINE CULTURE

## 2011-09-08 MED ORDER — POTASSIUM CHLORIDE CRYS ER 20 MEQ PO TBCR
40.0000 meq | EXTENDED_RELEASE_TABLET | ORAL | Status: AC
  Administered 2011-09-08 (×2): 40 meq via ORAL
  Filled 2011-09-08 (×2): qty 2

## 2011-09-08 MED ORDER — ACETAMINOPHEN 325 MG PO TABS
650.0000 mg | ORAL_TABLET | Freq: Four times a day (QID) | ORAL | Status: DC | PRN
  Administered 2011-09-08 – 2011-09-29 (×11): 650 mg via ORAL
  Filled 2011-09-08 (×7): qty 2
  Filled 2011-09-08: qty 1
  Filled 2011-09-08 (×3): qty 2

## 2011-09-08 MED ORDER — HEPARIN SOD (PORCINE) IN D5W 100 UNIT/ML IV SOLN
1250.0000 [IU]/h | INTRAVENOUS | Status: DC
  Filled 2011-09-08: qty 250

## 2011-09-08 MED ORDER — POTASSIUM CHLORIDE CRYS ER 20 MEQ PO TBCR
40.0000 meq | EXTENDED_RELEASE_TABLET | Freq: Once | ORAL | Status: AC
  Administered 2011-09-08: 40 meq via ORAL
  Filled 2011-09-08: qty 2

## 2011-09-08 MED ORDER — DEXTROSE 5 % IV SOLN
2.0000 g | Freq: Two times a day (BID) | INTRAVENOUS | Status: DC
  Administered 2011-09-09 – 2011-09-27 (×38): 2 g via INTRAVENOUS
  Filled 2011-09-08 (×41): qty 2

## 2011-09-08 MED ORDER — FUROSEMIDE 10 MG/ML IJ SOLN
20.0000 mg | Freq: Once | INTRAMUSCULAR | Status: AC
  Administered 2011-09-08: 20 mg via INTRAVENOUS
  Filled 2011-09-08: qty 2

## 2011-09-08 NOTE — Progress Notes (Signed)
Patient name: Carrie [name] Medical record number: [id number] Date of birth: 09-07-1941 Age: 70 y.o. Gender: female PCP: Geraldo Pitter, MD, Dr Clarene [facility]  Date: 09/08/2011 Reason for Consult: sepsis, flu syndrome, delirium  Referring Physician: DR Lynnette Caffey ER Brief history  70 year old female who presented on 12/16 with 3 day history of fever and decreased energy. In the emergency room she was febrile 104 Fahrenheit, tachycardic to 130, high white count and therefore meeting SIRS criteria. Pro-calcitonin elevated. Urine analysis possibly abnormal. Chest x-ray with some abnormalities suggestive of viral pneumonitis or increased interstitial prominence. She's received 1 g Rocephin.  PCCM admitted for concern of sepsis. (of note had flue like symptoms ~3 weeks before which she had improved from).   Lines/tubes None  Culture data/sepsis markers Legionella antigen 12/17: negative Urine strep 12/17: negative Flu PCR: negative Influenza A and B. nasopharyngeal swab antibody >>NEG MRSA PCR: negative Multiplex respiratory virus panel 18 viruses PCR 09/06/11 >> Blood culture 12/17: STREPTOCOCCUS PNEUMONIAE Urine culture 12/17>>> Pro-calcitonin 09/06/2011 is 8 Lactate 09/06/2011 is less than 2  Antibiotics Ceftriaxone 2 g 12/16>>> Azithromycin 12/16>>>12/18 Tamiflu 12/16>>>12/18 Vancomycin 12/17 >>  Best practice Pepcid IV heparin drip for a to fibrillation  Protocols/consults None [facility] 12/17 Neuro 12/18 ID 12/18  Events/studies Echo 12/17: Left ventricle: Systolic function was vigorous. The estimated ejection fraction was in the range of 65% to 70%. Although no diagnostic regional wall motion abnormality was identified, this possibility cannot be completely excluded on the basis of this study. Mitral prosthesis and arrhythmia prevent evaluation of LV diastolic function. 12/17: CT neck:Apparent linear filling defect within the right common carotid artery, possibly  artifactual, although focal dissection cannot be excluded. Consider CTA/MRA neck for further characterization as clinically warranted.No evidence of fracture or dislocation.No epidural abscess/fluid collection is seen. 12/17: CT head:Suspect slight increase in left frontal subarachnoid blood. Icannot completely exclude that this increased prominence relates to a component of enhancement Recommend repeat noncontrast imaging in24-48 hours. 12/18: CT neck: No visible proximal great vessel dissection within limits of detection by artifact from pacemaker battery pack. No evidence for flow reducing lesion in the right or left carotid system. 12/18: No proximal carotid or basilar stenosis. No visible intracranial dissection. 12/18: Suspect slight increase in left frontal subarachnoid blood. I cannot completely exclude that this increased prominence relates to  a component of enhancement Recommend repeat noncontrast imaging in 24-48 hours. 12/20: TEE ([facility])>>>  Subjective No distress  Objective Temp:  [98.3 F (36.8 C)-100.8 F (38.2 C)] 100.4 F (38 C) (12/18 1200) Pulse Rate:  [51-134] 73  (12/18 1400) Resp:  [18-35] 26  (12/18 1400) BP: (108-159)/(55-91) 148/70 mmHg (12/18 1400) SpO2:  [91 %-97 %] 97 % (12/18 1400) Weight:  [60.8 kg (134 lb 0.6 oz)] 134 lb 0.6 oz (60.8 kg) (12/18 0500)    Intake/Output Summary (Last 24 hours) at 09/08/11 1512 Last data filed at 09/08/11 1200  Gross per 24 hour  Intake   3530 ml  Output   3715 ml  Net   -185 ml   Physical exam Temp:  [98.3 F (36.8 C)-100.8 F (38.2 C)] 100.4 F (38 C) (12/18 1200) Pulse Rate:  [51-134] 73  (12/18 1400) Resp:  [18-35] 26  (12/18 1400) BP: (108-159)/(55-91) 148/70 mmHg (12/18 1400) SpO2:  [91 %-97 %] 97 % (12/18 1400) Weight:  [60.8 kg (134 lb 0.6 oz)] 134 lb 0.6 oz (60.8 kg) (12/18 0500)  General: 70 year old female currently confused with generalized pain. Specifically  over neck and C spine.  HENT: mucous  membrane moist, no JVD. She is tender to palpation of posterior C spine area but can easily bend neck without discomfort.  Pulm: clear, no accessory muscle use Card: irregular  irregular Abd: non-tender, no organomegally + bowel sounds GU: foley cath to straight drain Neuro: awake, oriented to person and place but still confused.   radiology  pcxr: 12/16: right basilar prominent interstitial markings.   LAB RESULT  Lab 09/08/11 0309 09/07/11 0320 09/06/11 1200  NA 135 136 135  K 3.0* 3.0* 3.8  CL 106 104 103  CO2 21 21 21   BUN 11 11 27*  CREATININE 0.60 0.59 0.96  GLUCOSE 111* 134* 159*    Lab 09/08/11 0309 09/07/11 0320 09/06/11 2005  HGB 12.1 13.5 11.7*  HCT 34.3* 38.3 33.8*  WBC 21.1* 24.5* 24.5*  PLT 120* 120* 127*    Assessment and Plan  SIRS/ Sepsis, in setting of Pneumococcal bacteremia. Lab 09/08/11 0309 09/07/11 0320 09/06/11 2005  WBC 21.1* 24.5* 24.5*  Plan: -cont current antibiotics/ see flow sheet -follow up on pending culture data -keep euvolemic -id called will ask them to assist with length or rx and regimen -TEE for Thursday by [facility]  Encephalopathy acute: in setting of sepsis. Possible encephalitis. Also has newly identified [facility] 12/17. Neuro following.  Lab Results  Component Value Date   INR 2.79* 09/08/2011   INR 2.15* 09/06/2011   INR 3.3 RATIO* 09/06/2006  Plan: -supportive care -no heparin for now -FFP for coagulopathy  Atrial fibrillation with rapid ventricular response.  History of mitral valve replacement Plan: -keep euvolemic -resume lopressor for now, may also need to resume cardiazem -resume cardiazem -no heparin for now  Hypokalemia  Lab 09/08/11 0309 09/07/11 0320 09/06/11 1200  K 3.0* 3.0* 3.8  plan: -replace and recheck    BABCOCK,PETE 09/08/2011, 3:12 PM    STAFF NOTE: I, Dr Lavinia Sharps have personally reviewed patient's available data, including medical history, events of note, physical examination and  test results as part of my evaluation. I have discussed with NP and other care providers such as pharmacist, RN and RRT.  In addition,  I personally evaluated patient and elicited key findings of pneumococcal bacteremia. The history is classic for Fu of flulike illness several weeks ago followed by onset of community-acquired pneumonia with pneumococcus. Complicating the picture is that of ongoing encephalopathy and mild subarachnoid hemorrhage and also concern or question for endocarditis due to prosthetic valve. We'll involve neurology, cardiology, infectious diseases to help navigate through this. She had some respiratory distress earlier and we will diurese her for that with one dose Lasix.  Rest per [facility] note is outlined above and that I agree with  The patient is critically ill with multiple organ systems failure and requires high complexity decision making for assessment and support, frequent evaluation and titration of therapies, application of advanced monitoring technologies and extensive interpretation of multiple databases.   Critical Care Time devoted to patient care services described in this note is  35  minutes.

## 2011-09-08 NOTE — Consult Note (Addendum)
Reason for Consult: sepsis, Mitral valve replacement, with a history of PPM.     Referring Physician: Dr. Leda Quail Cardiologist: Dr. Julieanne Manson Primary MD: Dr. Lowella Bandy    Carrie [name] is an 70 y.o. female.    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 Nonsmoker. Nonalcoholic. Completely functional at baseline. No no diabetes or renal problems. She says she's had a flu shot this season. But 3 weeks ago had flulike symptoms and was exhausted and bedridden but according to the sisters had recovered. 4 days ago did shopping at [employer] and was noticed to be fine. The following day she developed fever, exhaustion and had been in bed. The sisters could not reach her. The night before admission they spoke to the husband and they could not talk to the patient and they were concerned. 09/06/11  they went to see the patient and noted she was ill and brought her to the emergency room.  She had fever of 104.0 pulse of 130, WBC of 24.5, plts of 127, amylase 157, Lipase 122. Cardiac enzymes were negative. She has been placed on antibiotics.   She is in atrial fibrillation, was on coumadin, but she also had altered mental status.  CT of brain with small tiny lt. Frontal cortical subarachnoid hemorrhages. CT of the neck with ??? A right internal carotid artery dissection, though Dr. Anne Hahn felt this was artifact.    With the artificial valve, sepsis, and small subarachnoid hemorrhages concern for cardiogenic emboli.     CTA of neck without great vessel dissection and no evidence for flow reducing lesion in the right or left carotid system. CTA of the head without proximal carotid or basilar stenosis -suspected slight increase in left frontal  subarachnoid blood.   Past Medical History  Diagnosis Date  . Hypertension   . Atrial fibrillation     normal coroonaries cath 2004. Dr Clarene [facility]   . Sick sinus syndrome     Dr Sharrell Ku. EP study negative for inducibel arrythmia. ? pacer since 2007  . Hyperlipidemia   . Arthritis   . Anxiety   . Diverticula of colon   . Pneumonia 2009    resolved.? OPD Rx    Past Surgical History  Procedure Date  . Pacemaker insertion   . Mitral valve replacement 1998    St Jude  . Appendectomy   . Tubal ligation     No family history on file. Social History:  reports that she has never smoked. She does not have any smokeless tobacco history on file. She reports that she does not drink alcohol or use illicit drugs.  Allergies:  Allergies  Allergen Reactions  . Codeine Nausea And Vomiting    Medications Prior to Admission  Medication Dose Route Frequency Provider Last Rate Last Dose  . 0.9 %  sodium chloride infusion   Intravenous Continuous Hollice Espy, MD 150 mL/hr at 09/08/11 1209    . 0.9 %  sodium chloride infusion  250 mL Intravenous PRN Kalman Shan, MD      . acetaminophen (TYLENOL) 325 MG suppository           . acetaminophen (TYLENOL) suppository 975 mg  975 mg Rectal Once Laray Anger, DO  975 mg at 09/06/11 1138  . antiseptic oral rinse (BIOTENE) solution 15 mL  15 mL Mouth Rinse q12n4p Sosan Abdullah   15 mL at 09/08/11 1210  . aspirin chewable tablet 324 mg  324 mg Oral NOW Kalman Shan, MD   324 mg at 09/06/11 1640   Or  . aspirin suppository 300 mg  300 mg Rectal NOW Kalman Shan, MD      . azithromycin (ZITHROMAX) 500 mg in dextrose 5 % 250 mL IVPB  500 mg Intravenous Q24H Kalman Shan, MD   500 mg at 09/07/11 1555  . cefTRIAXone (ROCEPHIN) 1 g in dextrose 5 % 50 mL IVPB  1 g Intravenous Once Laray Anger, DO   1 g at 09/06/11 1419  . cefTRIAXone (ROCEPHIN) 2 g in dextrose 5 % 50 mL IVPB  2 g Intravenous Q24H Kalman Shan, MD    2 g at 09/07/11 1514  . chlorhexidine (PERIDEX) 0.12 % solution 15 mL  15 mL Mouth Rinse BID Sosan Abdullah   15 mL at 09/08/11 0754  . famotidine (PEPCID) tablet 20 mg  20 mg Oral BID Kalman Shan, MD   20 mg at 09/08/11 1008  . fentaNYL (SUBLIMAZE) injection 50 mcg  50 mcg Intravenous Q1H PRN [name], MD   50 mcg at 09/08/11 0754  . furosemide (LASIX) injection 20 mg  20 mg Intravenous Once Kalman Shan, MD   20 mg at 09/08/11 0956  . iohexol (OMNIPAQUE) 300 MG/ML solution 100 mL  100 mL Intravenous Once PRN Medication Radiologist   100 mL at 09/07/11 1730  . iohexol (OMNIPAQUE) 350 MG/ML injection 100 mL  100 mL Intravenous Once PRN Medication Radiologist   100 mL at 09/07/11 2312  . metoprolol tartrate (LOPRESSOR) 25 mg/10 mL oral suspension 25 mg  25 mg Oral BID Anders Simmonds, NP   25 mg at 09/08/11 1008  . potassium chloride SA (K-DUR,KLOR-CON) CR tablet 40 mEq  40 mEq Oral Q4H Anders Simmonds, NP   40 mEq at 09/07/11 1948  . potassium chloride SA (K-DUR,KLOR-CON) CR tablet 40 mEq  40 mEq Oral Once Kalman Shan, MD   40 mEq at 09/08/11 0956  . sodium chloride 0.9 % bolus 1,000 mL  1,000 mL Intravenous Once Hollice Espy, MD   1,000 mL at 09/06/11 1421  . sodium chloride 0.9 % bolus 2,000 mL  2,000 mL Intravenous Once Kalman Shan, MD   2,000 mL at 09/06/11 1644  . sodium phosphate 30 mmol in dextrose 5 % 250 mL infusion  30 mmol Intravenous Once Ihor Austin Bice   30 mmol at 09/06/11 2323  . vancomycin (VANCOCIN) 500 mg in sodium chloride 0.9 % 100 mL IVPB  500 mg Intravenous Q12H [name], PHARMD   500 mg at 09/08/11 0557  . DISCONTD: 0.9 %  sodium chloride infusion   Intravenous Continuous Kalman Shan, MD 150 mL/hr at 09/06/11 1639    . DISCONTD: diltiazem (CARDIZEM) 100 mg in dextrose 5 % 100 mL infusion  5 mg/hr Intravenous Titrated Hollice Espy, MD      . DISCONTD: heparin ADULT infusion 100 units/ml (25000 units/250 ml)  1,250 Units/hr Intravenous  Continuous Julian Crowford Darlina Guys., PHARMD   12.5 mL/hr at 09/07/11 1800  . DISCONTD: heparin ADULT infusion 100 units/ml (25000 units/250 ml)  1,250 Units/hr Intravenous Continuous Christine Elizabeth Paspek, PHARMD      . DISCONTD: oseltamivir (TAMIFLU) capsule 75 mg  75 mg Oral BID Kalman Shan, MD  75 mg at 09/07/11 2137   No current outpatient prescriptions on file as of 09/08/2011.    Results for orders placed during the hospital encounter of 09/06/11 (from the past 48 hour(s))  CULTURE, BLOOD (ROUTINE X 2)     Status: Normal (Preliminary result)   Collection Time   09/06/11  4:00 PM      Component Value Range Comment   Specimen Description BLOOD RIGHT ARM      Special Requests        Value: Normal BOTTLES DRAWN AEROBIC AND ANAEROBIC 5CC EACH   Setup Time [phone]      Culture        Value:        BLOOD CULTURE RECEIVED NO GROWTH TO DATE CULTURE WILL BE HELD FOR 5 DAYS BEFORE ISSUING A FINAL NEGATIVE REPORT   Report Status PENDING     CULTURE, BLOOD (ROUTINE X 2)     Status: Normal (Preliminary result)   Collection Time   09/06/11  4:10 PM      Component Value Range Comment   Specimen Description BLOOD STAT BOTTLES DRAWN AEROBIC ONLY RIGHT HAND      Special Requests Normal 5CC       Setup Time [id number]      Culture        Value:        BLOOD CULTURE RECEIVED NO GROWTH TO DATE CULTURE WILL BE HELD FOR 5 DAYS BEFORE ISSUING A FINAL NEGATIVE REPORT   Report Status PENDING     MRSA PCR SCREENING     Status: Normal   Collection Time   09/06/11  6:46 PM      Component Value Range Comment   MRSA by PCR NEGATIVE  NEGATIVE    STREP PNEUMONIAE URINARY ANTIGEN     Status: Normal   Collection Time   09/06/11  7:47 PM      Component Value Range Comment   Strep Pneumo Urinary Antigen NEGATIVE  NEGATIVE    LEGIONELLA ANTIGEN, URINE     Status: Normal   Collection Time   09/06/11  7:48 PM      Component Value Range Comment   Specimen Description URINE, CATHETERIZED       Special Requests NONE      Legionella Antigen, Urine Negative for Legionella pneumophilia serogroup 1      Report Status 09/07/2011 FINAL     HEPARIN LEVEL (UNFRACTIONATED)     Status: Abnormal   Collection Time   09/06/11  8:05 PM      Component Value Range Comment   Heparin Unfractionated 0.10 (*) 0.30 - 0.70 (IU/mL)   AMYLASE     Status: Abnormal   Collection Time   09/06/11  8:05 PM      Component Value Range Comment   Amylase 157 (*) 0 - 105 (U/L)   CBC     Status: Abnormal   Collection Time   09/06/11  8:05 PM      Component Value Range Comment   WBC 24.5 (*) 4.0 - 10.5 (K/uL)    RBC 3.96  3.87 - 5.11 (MIL/uL)    Hemoglobin 11.7 (*) 12.0 - 15.0 (g/dL)    HCT 21.3 (*) 08.6 - 46.0 (%)    MCV 85.4  78.0 - 100.0 (fL)    MCH 29.5  26.0 - 34.0 (pg)    MCHC 34.6  30.0 - 36.0 (g/dL)    RDW 57.8 (*) 46.9 - 15.5 (%)    Platelets  127 (*) 150 - 400 (K/uL)   CARDIAC PANEL(CRET KIN+CKTOT+MB+TROPI)     Status: Normal   Collection Time   09/06/11  8:05 PM      Component Value Range Comment   Total CK 116  7 - 177 (U/L)    CK, MB 2.3  0.3 - 4.0 (ng/mL)    Troponin I <0.30  <0.30 (ng/mL)    Relative Index 2.0  0.0 - 2.5    PHOSPHORUS     Status: Abnormal   Collection Time   09/06/11  8:05 PM      Component Value Range Comment   Phosphorus 1.7 (*) 2.3 - 4.6 (mg/dL)   MAGNESIUM     Status: Normal   Collection Time   09/06/11  8:05 PM      Component Value Range Comment   Magnesium 2.0  1.5 - 2.5 (mg/dL)   LIPASE, BLOOD     Status: Abnormal   Collection Time   09/06/11  8:05 PM      Component Value Range Comment   Lipase 122 (*) 11 - 59 (U/L)   LACTIC ACID, PLASMA     Status: Normal   Collection Time   09/06/11  8:05 PM      Component Value Range Comment   Lactic Acid, Venous 1.1  0.5 - 2.2 (mmol/L)   TSH     Status: Normal   Collection Time   09/06/11  8:05 PM      Component Value Range Comment   TSH 0.453  0.350 - 4.500 (uIU/mL)   CORTISOL     Status: Normal    Collection Time   09/06/11  8:05 PM      Component Value Range Comment   Cortisol, Plasma 45.6     INFLUENZA PANEL BY PCR     Status: Normal   Collection Time   09/06/11  8:09 PM      Component Value Range Comment   Influenza A By PCR NEGATIVE  NEGATIVE     Influenza B By PCR NEGATIVE  NEGATIVE     H1N1 flu by pcr NOT DETECTED  NOT DETECTED    CBC     Status: Abnormal   Collection Time   09/07/11  3:20 AM      Component Value Range Comment   WBC 24.5 (*) 4.0 - 10.5 (K/uL)    RBC 4.51  3.87 - 5.11 (MIL/uL)    Hemoglobin 13.5  12.0 - 15.0 (g/dL)    HCT 40.9  81.1 - 91.4 (%)    MCV 84.9  78.0 - 100.0 (fL)    MCH 29.9  26.0 - 34.0 (pg)    MCHC 35.2  30.0 - 36.0 (g/dL)    RDW 78.2 (*) 95.6 - 15.5 (%)    Platelets 120 (*) 150 - 400 (K/uL)   BASIC METABOLIC PANEL     Status: Abnormal   Collection Time   09/07/11  3:20 AM      Component Value Range Comment   Sodium 136  135 - 145 (mEq/L)    Potassium 3.0 (*) 3.5 - 5.1 (mEq/L) DELTA CHECK NOTED   Chloride 104  96 - 112 (mEq/L)    CO2 21  19 - 32 (mEq/L)    Glucose, Bld 134 (*) 70 - 99 (mg/dL)    BUN 11  6 - 23 (mg/dL) DELTA CHECK NOTED   Creatinine, Ser 0.59  0.50 - 1.10 (mg/dL) DELTA CHECK NOTED   Calcium 8.7  8.4 -  10.5 (mg/dL)    GFR calc non Af Amer >90  >90 (mL/min)    GFR calc Af Amer >90  >90 (mL/min)   MAGNESIUM     Status: Normal   Collection Time   09/07/11  3:20 AM      Component Value Range Comment   Magnesium 1.8  1.5 - 2.5 (mg/dL)   PHOSPHORUS     Status: Normal   Collection Time   09/07/11  3:20 AM      Component Value Range Comment   Phosphorus 2.3  2.3 - 4.6 (mg/dL)   HEPARIN LEVEL (UNFRACTIONATED)     Status: Abnormal   Collection Time   09/07/11  3:30 AM      Component Value Range Comment   Heparin Unfractionated 0.18 (*) 0.30 - 0.70 (IU/mL)   HEPARIN LEVEL (UNFRACTIONATED)     Status: Normal   Collection Time   09/07/11  1:41 PM      Component Value Range Comment   Heparin Unfractionated 0.48  0.30  - 0.70 (IU/mL)   CBC     Status: Abnormal   Collection Time   09/08/11  3:09 AM      Component Value Range Comment   WBC 21.1 (*) 4.0 - 10.5 (K/uL)    RBC 4.06  3.87 - 5.11 (MIL/uL)    Hemoglobin 12.1  12.0 - 15.0 (g/dL)    HCT 04.5 (*) 40.9 - 46.0 (%)    MCV 84.5  78.0 - 100.0 (fL)    MCH 29.8  26.0 - 34.0 (pg)    MCHC 35.3  30.0 - 36.0 (g/dL)    RDW 81.1 (*) 91.4 - 15.5 (%)    Platelets 120 (*) 150 - 400 (K/uL)   HEPARIN LEVEL (UNFRACTIONATED)     Status: Abnormal   Collection Time   09/08/11  3:09 AM      Component Value Range Comment   Heparin Unfractionated <0.10 (*) 0.30 - 0.70 (IU/mL)   BASIC METABOLIC PANEL     Status: Abnormal   Collection Time   09/08/11  3:09 AM      Component Value Range Comment   Sodium 135  135 - 145 (mEq/L)    Potassium 3.0 (*) 3.5 - 5.1 (mEq/L)    Chloride 106  96 - 112 (mEq/L)    CO2 21  19 - 32 (mEq/L)    Glucose, Bld 111 (*) 70 - 99 (mg/dL)    BUN 11  6 - 23 (mg/dL)    Creatinine, Ser 7.82  0.50 - 1.10 (mg/dL)    Calcium 8.7  8.4 - 10.5 (mg/dL)    GFR calc non Af Amer >90  >90 (mL/min)    GFR calc Af Amer >90  >90 (mL/min)   PROCALCITONIN     Status: Normal   Collection Time   09/08/11  3:09 AM      Component Value Range Comment   Procalcitonin 3.70     PROTIME-INR     Status: Abnormal   Collection Time   09/08/11  2:02 PM      Component Value Range Comment   Prothrombin Time 29.9 (*) 11.6 - 15.2 (seconds)    INR 2.79 (*) 0.00 - 1.49     Ct Angio Head W/cm &/or Wo Cm  09/08/2011  *RADIOLOGY REPORT*  Clinical Data:  2-week history of fever and malaise.  Possible right common carotid artery dissections seen on CT cervical spine.  CT ANGIOGRAPHY HEAD AND NECK  Technique:  Multidetector  CT imaging of the head and neck was performed using the standard protocol during bolus administration of intravenous contrast.  Multiplanar CT image reconstructions including MIPs were obtained to evaluate the vascular anatomy. Carotid stenosis  measurements (when applicable) are obtained utilizing NASCET criteria, using the distal internal carotid diameter as the denominator.  Contrast: OMNIPAQUE IOHEXOL 350 MG/ML IV SOLN an  Comparison:  CT head and C-spine earlier today.  CTA NECK  Findings:  There is reflux of contrast into the left internal jugular vein.  This may be related to the left sided pacemaker placement.  There is fair opacification of the craniocerebral vasculature.  Atheromatous change can be seen in the transverse arch.  There is considerable Hounsfield artifact from the pacemaker battery pack. There is no visible proximal great vessel dissection or stenosis.  Both carotid bifurcations are widely patent.  Mild nonstenotic calcific atheromatous change can be seen on the right.  Cervical internal carotid arteries tortuous but patent without dissection. Both vertebrals are patent and equal in size through the neck. Possible 15 x 16 mm right thyroid nodule.  Consider thyroid ultrasound for further evaluation.  Lung apices clear.   Review of the MIP images confirms the above findings.  IMPRESSION: No visible proximal great vessel dissection within limits of detection by artifact from pacemaker battery pack.  No evidence for flow reducing lesion in the right or left carotid system.  CTA HEAD  Findings:  Calcific nonstenotic atheromatous change of the skull base, cavernous, and supraclinoid internal carotid arteries.  No visible proximal stenosis of the anterior, middle, or posterior cerebral arteries.  Basilar artery patent without focal stenosis. No visible intracranial aneurysm.  Post infusion imaging of the head reveals no abnormal enhancement.  Chronic left basal ganglia infarct redemonstrated.  Left frontal subarachnoid blood appears slightly greater than was seen earlier. It is possible a component of this increased conspicuity relates to enhancement postcontrast. Recommend repeat noncontrast examination for continued surveillance.    Review of the MIP images confirms the above findings.  IMPRESSION: No proximal carotid or basilar stenosis.  No visible intracranial dissection.  Suspect slight increase in left frontal subarachnoid blood. I cannot completely exclude that this increased prominence relates to a component of enhancement Recommend repeat noncontrast imaging in 24-48 hours.  Original Report Authenticated By: Elsie Stain, M.D.   Ct Head Wo Contrast  09/07/2011  *RADIOLOGY REPORT*  Clinical Data: Headaches  CT HEAD WITHOUT CONTRAST  Technique:  Contiguous axial images were obtained from the base of the skull through the vertex without contrast.  Comparison: [place] MRI brain dated 11/10/2005  Findings: Tiny hyperdense foci overlying the left frontal lobe (series 2/images 14 and 22) and left medial frontal lobe (series 2/image 19), possibly reflecting subarachnoid hemorrhage.  No mass lesion, mass effect, or midline shift.  No CT evidence of acute infarction.  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  Subcortical white matter and periventricular small vessel ischemic changes.  Mildly age related atrophy.  No ventriculomegaly.  The visualized paranasal sinuses are essentially clear. The mastoid air cells are unopacified.  No evidence of calvarial fracture.  IMPRESSION: Possible tiny foci of subarachnoid hemorrhage overlying the left frontal lobe, as described above.  Old lacunar infarct with small vessel ischemic changes  Critical Value/emergent results were called by telephone at the time of interpretation on 09/07/2011  at 1810 hours  to  Dr Delford Field, who verbally acknowledged these results.  Original Report Authenticated By: Charline Bills, M.D.   Ct  Angio Neck W/cm &/or Wo/cm  09/08/2011  *RADIOLOGY REPORT*  Clinical Data:  2-week history of fever and malaise.  Possible right common carotid artery dissections seen on CT cervical spine.  CT ANGIOGRAPHY HEAD AND NECK  Technique:  Multidetector CT imaging of the head and neck  was performed using the standard protocol during bolus administration of intravenous contrast.  Multiplanar CT image reconstructions including MIPs were obtained to evaluate the vascular anatomy. Carotid stenosis measurements (when applicable) are obtained utilizing NASCET criteria, using the distal internal carotid diameter as the denominator.  Contrast: OMNIPAQUE IOHEXOL 350 MG/ML IV SOLN an  Comparison:  CT head and C-spine earlier today.  CTA NECK  Findings:  There is reflux of contrast into the left internal jugular vein.  This may be related to the left sided pacemaker placement.  There is fair opacification of the craniocerebral vasculature.  Atheromatous change can be seen in the transverse arch.  There is considerable Hounsfield artifact from the pacemaker battery pack. There is no visible proximal great vessel dissection or stenosis.  Both carotid bifurcations are widely patent.  Mild nonstenotic calcific atheromatous change can be seen on the right.  Cervical internal carotid arteries tortuous but patent without dissection. Both vertebrals are patent and equal in size through the neck. Possible 15 x 16 mm right thyroid nodule.  Consider thyroid ultrasound for further evaluation.  Lung apices clear.   Review of the MIP images confirms the above findings.  IMPRESSION: No visible proximal great vessel dissection within limits of detection by artifact from pacemaker battery pack.  No evidence for flow reducing lesion in the right or left carotid system.  CTA HEAD  Findings:  Calcific nonstenotic atheromatous change of the skull base, cavernous, and supraclinoid internal carotid arteries.  No visible proximal stenosis of the anterior, middle, or posterior cerebral arteries.  Basilar artery patent without focal stenosis. No visible intracranial aneurysm.  Post infusion imaging of the head reveals no abnormal enhancement.  Chronic left basal ganglia infarct redemonstrated.  Left frontal subarachnoid blood  appears slightly greater than was seen earlier. It is possible a component of this increased conspicuity relates to enhancement postcontrast. Recommend repeat noncontrast examination for continued surveillance.   Review of the MIP images confirms the above findings.  IMPRESSION: No proximal carotid or basilar stenosis.  No visible intracranial dissection.  Suspect slight increase in left frontal subarachnoid blood. I cannot completely exclude that this increased prominence relates to a component of enhancement Recommend repeat noncontrast imaging in 24-48 hours.  Original Report Authenticated By: Elsie Stain, M.D.   Ct Cervical Spine W Contrast  09/07/2011  *RADIOLOGY REPORT*  Clinical Data: C-spine tenderness, evaluate for fracture or abscess  CT CERVICAL SPINE WITH CONTRAST  Technique:  Multidetector CT imaging of the cervical spine was performed during intravenous contrast administration. Multiplanar CT image reconstructions were also generated.  Contrast: OMNIPAQUE IOHEXOL 300 MG/ML IV SOLN  Comparison: None.  Findings: Normal cervical lordosis.  No evidence of fracture or dislocation.  Vertebral body heights are maintained.  The dens appears intact.  No prevertebral soft tissue swelling.  Mild multilevel degenerative changes.  No epidural abscess/fluid collection is seen.  There is a possible linear filling defect within the right common carotid artery (series 606/image 69, series 605/image 4), and although this may reflect artifact at the thoracic inlet, a focal dissection cannot be excluded.  IMPRESSION: Apparent linear filling defect within the right common carotid artery, possibly artifactual, although focal dissection cannot  be excluded.  Consider CTA/MRA neck for further characterization as clinically warranted.  No evidence of fracture or dislocation.  No epidural abscess/fluid collection is seen.  Critical Value/emergent results were called by telephone at the time of interpretation on  09/07/2011  at 1810 hours  to  Dr Delford Field, who verbally acknowledged these results.  Original Report Authenticated By: Charline Bills, M.D.   Dg Chest Port 1 View  09/08/2011  *RADIOLOGY REPORT*  Clinical Data: Respiratory distress.  PORTABLE CHEST - 1 VIEW  Comparison: 09/06/2011  Findings: Cardiomegaly.  Mild vascular congestion.  Interstitial prominence could reflect interstitial edema.  This is increased slightly since prior study.  No effusions.  No acute bony abnormality.  IMPRESSION: Increasing interstitial prominence, question interstitial edema.  Original Report Authenticated By: Cyndie Chime, M.D.    ROS: General:+flu like symptoms Skin:no rashes Heart:no chest pain Lungs:neg. Abd:no pain Ext:no pain Neuro: no syncope    Blood pressure 148/70, pulse 73, temperature 100.4 F (38 C), temperature source Axillary, resp. rate 26, height 5\' 1"  (1.549 m), weight 60.8 kg (134 lb 0.6 oz), SpO2 97.00%. PE: General:A&O X 3 Skin:W&D, brisk capillary refill  Heent:Normocephalic, sclera clear Neck:no JVD Lungs:clear Heart: S1S2 irreg, irreg, crisp valve closure ZOX:WRUE, nontender Neuro:A&O MAE   Assessment/Plan Patient Active Problem List  Diagnoses  . CONSTIPATION  . Sepsis  . Encephalopathy acute  . Flu-like symptoms  . UTI (lower urinary tract infection)  . Atrial fibrillation with rapid ventricular response  . S/P mitral valve replacement  . Pneumonia  . Subarachnoid hemorrhage  . Muscle weakness of lower extremity  PLAN:  TEE for Thursday at [facility] R 09/08/2011, 2:52 PM Agree with note written by Nada Boozer RNP  Admitted with encephalopathy. H/O SJ MVR. TTE w/o evidence of SBE. On ATBX. PAn cultured. Exam benign. Crisp VS. Head CT notable for [facility]. Neuro following. H/O PTVPM. VVI. Plan TEE Thurs to R/O SBE. Given supra theraputic INR and SAH, agree with reversing coumadin AC. Need to balance risk of thromboembolism with SJ MVR un anticoagulated with  potential of worsening SAH. May want to get neurosurgery involved as well.   Runell Gess 09/08/2011 4:07 PM

## 2011-09-08 NOTE — Progress Notes (Signed)
ANTICOAGULATION CONSULT NOTE - Follow Up Consult  Pharmacy Consult for Heparin - resume therapy Indication: atrial fibrillation, mitral valve replacement  Allergies  Allergen Reactions  . Codeine Nausea And Vomiting    Patient Measurements: Height: 5\' 1"  (154.9 cm) Weight: 134 lb 0.6 oz (60.8 kg) IBW/kg (Calculated) : 47.8   Vital Signs: Temp: 98.3 F (36.8 C) (12/18 0400) Temp src: Oral (12/18 0400) BP: 132/82 mmHg (12/18 0500) Pulse Rate: 84  (12/18 0500)  Labs:  Basename 09/08/11 0309 09/07/11 1341 09/07/11 0330 09/07/11 0320 09/06/11 2005 09/06/11 1200  HGB 12.1 -- -- 13.5 -- --  HCT 34.3* -- -- 38.3 33.8* --  PLT 120* -- -- 120* 127* --  APTT -- -- -- -- -- --  LABPROT -- -- -- -- -- 24.4*  INR -- -- -- -- -- 2.15*  HEPARINUNFRC <0.10* 0.48 0.18* -- -- --  CREATININE 0.60 -- -- 0.59 -- 0.96  CKTOTAL -- -- -- -- 116 --  CKMB -- -- -- -- 2.3 --  TROPONINI -- -- -- -- <0.30 --   Estimated Creatinine Clearance: 55.5 ml/min (by C-G formula based on Cr of 0.6).    Assessment: 70 yo F Patient previously on heparin drip for warfarin bridge while warfarin is on hold. Previous heparin level was therapeutic at 0.48 Heparin drip was discontinued 12/17 about 2000 due to CT report with small frontal SAH, and ? Flap in L carotid artery ?dissection.   No bleeding reported. Per neurology consult, concern for septic emboli, telephone order to resume heparin drip  Goal of Therapy:  Heparin level 0.3-0.7 units/ml   Plan:  Resume Heparin 1250units/hr, check level in 6 hours. Follow daily labs, heparin level and CBC Await orders to resume warfarin when appropriate.  Lynann Beaver PharmD  Pager [phone] 09/08/2011 8:04 AM

## 2011-09-08 NOTE — Progress Notes (Addendum)
Subjective: Patient feels better as compared to yesterday. She says that the pressure in the back of her head has eased up. No nausea, vomiting or diarrhea noted. Patient is still in the bed and has not gotten. She feels that she would not be able to walk as her legs are weak.  Objective: Vital signs in last 24 hours: Temp:  [98.3 F (36.8 C)-101.3 F (38.5 C)] 98.8 F (37.1 C) (12/18 0800) Pulse Rate:  [51-117] 84  (12/18 0500) Resp:  [18-35] 23  (12/18 0500) BP: (108-159)/(51-91) 132/82 mmHg (12/18 0500) SpO2:  [91 %-97 %] 95 % (12/18 0500) Weight:  [134 lb 0.6 oz (60.8 kg)] 134 lb 0.6 oz (60.8 kg) (12/18 0500)     Nutritional status: NPO  Physical Exam: General: Vital signs reviewed and noted. Well-developed, well-nourished, in no acute distress; alert, appropriate and cooperative throughout examination.  Head: Normocephalic, atraumatic.  Eyes: PERRL, EOMI, No signs of anemia or jaundince.     Nose: Mucous membranes moist, not inflammed, nonerythematous.  Throat: Oropharynx nonerythematous, no exudate appreciated.   Neck: No deformities, masses, or tenderness noted.Supple, No carotid Bruits, no JVD.  Lungs:  Normal respiratory effort. Clear to auscultation BL without crackles or wheezes.  Heart: Irregularly irregular rhythm, mechanical valve click heard  Abdomen:  BS normoactive. Soft, Nondistended, non-tender.  No masses or organomegaly.  Extremities: No pretibial edema.  Skin: No visible rashes, scars.    Neurologic Exam:  Mental Status:   Alert, oriented, thought content appropriate. Speech fluent without evidence of aphasia. Able to follow 3 step commands without difficulty. Cranial Nerves:  II: visual fields grossly normal, pupils equal, round, reactive to light and accommodation  III,IV, VI: ptosis not present, extraocular muscles extra-ocular motions intact bilaterally  V,VII: smile symmetric, facial light touch sensation normal bilaterally  VIII: hearing normal  bilaterally  IX,X: gag reflex present  XI: trapezius strength/neck flexion strength normal bilaterally  XII: tongue strength normal  Motor:  Patient is unable to lift her legs bilaterally which(hip muscles are week) although 5/5 at ankle and knee joint bilaterally Right : Upper extremity 5/5 Left: Upper extremity 5/5  Lower extremity 5/5 Lower extremity 5/5 (except as mentioned above) Tone and bulk:normal tone throughout; no atrophy noted  Sensory: Pinprick and light touch intact throughout, bilaterally  Deep Tendon Reflexes: 2+ and symmetric throughout  Plantars:  Right: downgoing Left: downgoing  Cerebellar:  normal finger-to-nose, normal rapid alternating movements. Unable to perform heel-to-shin test   Lab Results:  [facility] 09/08/11 0309 09/07/11 0320  WBC 21.1* 24.5*  HGB 12.1 13.5  HCT 34.3* 38.3  PLT 120* 120*  NA 135 136  K 3.0* 3.0*  CL 106 104  CO2 21 21  GLUCOSE 111* 134*  BUN 11 11  CREATININE 0.60 0.59  CALCIUM 8.7 8.7  LABA1C -- --   Lipid Panel No results found for this basename: CHOL,TRIG,HDL,CHOLHDL,VLDL,LDLCALC in the last 72 hours  Studies/Results: Ct Angio Head W/cm &/or Wo Cm  09/08/2011  *RADIOLOGY REPORT*  Clinical Data:  2-week history of fever and malaise.  Possible right common carotid artery dissections seen on CT cervical spine.  CT ANGIOGRAPHY HEAD AND NECK  Technique:  Multidetector CT imaging of the head and neck was performed using the standard protocol during bolus administration of intravenous contrast.  Multiplanar CT image reconstructions including MIPs were obtained to evaluate the vascular anatomy. Carotid stenosis measurements (when applicable) are obtained utilizing NASCET criteria, using the distal internal carotid diameter as the denominator.  Contrast: OMNIPAQUE IOHEXOL 350 MG/ML IV SOLN an  Comparison:  CT head and C-spine earlier today.  CTA NECK  Findings:  There is reflux of contrast into the left internal jugular vein.   This may be related to the left sided pacemaker placement.  There is fair opacification of the craniocerebral vasculature.  Atheromatous change can be seen in the transverse arch.  There is considerable Hounsfield artifact from the pacemaker battery pack. There is no visible proximal great vessel dissection or stenosis.  Both carotid bifurcations are widely patent.  Mild nonstenotic calcific atheromatous change can be seen on the right.  Cervical internal carotid arteries tortuous but patent without dissection. Both vertebrals are patent and equal in size through the neck. Possible 15 x 16 mm right thyroid nodule.  Consider thyroid ultrasound for further evaluation.  Lung apices clear.   Review of the MIP images confirms the above findings.  IMPRESSION: No visible proximal great vessel dissection within limits of detection by artifact from pacemaker battery pack.  No evidence for flow reducing lesion in the right or left carotid system.  CTA HEAD  Findings:  Calcific nonstenotic atheromatous change of the skull base, cavernous, and supraclinoid internal carotid arteries.  No visible proximal stenosis of the anterior, middle, or posterior cerebral arteries.  Basilar artery patent without focal stenosis. No visible intracranial aneurysm.  Post infusion imaging of the head reveals no abnormal enhancement.  Chronic left basal ganglia infarct redemonstrated.  Left frontal subarachnoid blood appears slightly greater than was seen earlier. It is possible a component of this increased conspicuity relates to enhancement postcontrast. Recommend repeat noncontrast examination for continued surveillance.   Review of the MIP images confirms the above findings.  IMPRESSION: No proximal carotid or basilar stenosis.  No visible intracranial dissection.  Suspect slight increase in left frontal subarachnoid blood. I cannot completely exclude that this increased prominence relates to a component of enhancement Recommend repeat  noncontrast imaging in 24-48 hours.  Original Report Authenticated By: Elsie Stain, M.D.   Dg Chest 2 View  09/06/2011  *RADIOLOGY REPORT*  Clinical Data: Rule out infiltrate. Confusion.  CHEST - 2 VIEW  Comparison: 02/20/2008  Findings:  Heart size appears mildly enlarged.  Prior median sternotomy CABG procedure.  Left chest wall pacer device is noted with lead in the right ventricle.  There are no pleural effusions or interstitial edema.  Chronic bronchitic changes are noted bilaterally.  IMPRESSION:  1.  Cardiac enlargement. 2.  No acute changes.  Original Report Authenticated By: Rosealee Albee, M.D.   Ct Head Wo Contrast  09/07/2011  *RADIOLOGY REPORT*  Clinical Data: Headaches  CT HEAD WITHOUT CONTRAST  Technique:  Contiguous axial images were obtained from the base of the skull through the vertex without contrast.  Comparison: [place] [facility] brain dated 11/10/2005  Findings: Tiny hyperdense foci overlying the left frontal lobe (series 2/images 14 and 22) and left medial frontal lobe (series 2/image 19), possibly reflecting subarachnoid hemorrhage.  No mass lesion, mass effect, or midline shift.  No CT evidence of acute infarction.  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  Subcortical white matter and periventricular small vessel ischemic changes.  Mildly age related atrophy.  No ventriculomegaly.  The visualized paranasal sinuses are essentially clear. The mastoid air cells are unopacified.  No evidence of calvarial fracture.  IMPRESSION: Possible tiny foci of subarachnoid hemorrhage overlying the left frontal lobe, as described above.  Old lacunar infarct with small vessel ischemic changes  Critical  Value/emergent results were called by telephone at the time of interpretation on 09/07/2011  at 1810 hours  to  Dr Delford Field, who verbally acknowledged these results.  Original Report Authenticated By: Charline Bills, M.D.   Ct Angio Neck W/cm &/or Wo/cm  09/08/2011  *RADIOLOGY REPORT*  Clinical  Data:  2-week history of fever and malaise.  Possible right common carotid artery dissections seen on CT cervical spine.  CT ANGIOGRAPHY HEAD AND NECK  Technique:  Multidetector CT imaging of the head and neck was performed using the standard protocol during bolus administration of intravenous contrast.  Multiplanar CT image reconstructions including MIPs were obtained to evaluate the vascular anatomy. Carotid stenosis measurements (when applicable) are obtained utilizing NASCET criteria, using the distal internal carotid diameter as the denominator.  Contrast: OMNIPAQUE IOHEXOL 350 MG/ML IV SOLN an  Comparison:  CT head and C-spine earlier today.  CTA NECK  Findings:  There is reflux of contrast into the left internal jugular vein.  This may be related to the left sided pacemaker placement.  There is fair opacification of the craniocerebral vasculature.  Atheromatous change can be seen in the transverse arch.  There is considerable Hounsfield artifact from the pacemaker battery pack. There is no visible proximal great vessel dissection or stenosis.  Both carotid bifurcations are widely patent.  Mild nonstenotic calcific atheromatous change can be seen on the right.  Cervical internal carotid arteries tortuous but patent without dissection. Both vertebrals are patent and equal in size through the neck. Possible 15 x 16 mm right thyroid nodule.  Consider thyroid ultrasound for further evaluation.  Lung apices clear.   Review of the MIP images confirms the above findings.  IMPRESSION: No visible proximal great vessel dissection within limits of detection by artifact from pacemaker battery pack.  No evidence for flow reducing lesion in the right or left carotid system.  CTA HEAD  Findings:  Calcific nonstenotic atheromatous change of the skull base, cavernous, and supraclinoid internal carotid arteries.  No visible proximal stenosis of the anterior, middle, or posterior cerebral arteries.  Basilar artery patent  without focal stenosis. No visible intracranial aneurysm.  Post infusion imaging of the head reveals no abnormal enhancement.  Chronic left basal ganglia infarct redemonstrated.  Left frontal subarachnoid blood appears slightly greater than was seen earlier. It is possible a component of this increased conspicuity relates to enhancement postcontrast. Recommend repeat noncontrast examination for continued surveillance.   Review of the MIP images confirms the above findings.  IMPRESSION: No proximal carotid or basilar stenosis.  No visible intracranial dissection.  Suspect slight increase in left frontal subarachnoid blood. I cannot completely exclude that this increased prominence relates to a component of enhancement Recommend repeat noncontrast imaging in 24-48 hours.  Original Report Authenticated By: Elsie Stain, M.D.   Ct Cervical Spine W Contrast  09/07/2011  *RADIOLOGY REPORT*  Clinical Data: C-spine tenderness, evaluate for fracture or abscess  CT CERVICAL SPINE WITH CONTRAST  Technique:  Multidetector CT imaging of the cervical spine was performed during intravenous contrast administration. Multiplanar CT image reconstructions were also generated.  Contrast: OMNIPAQUE IOHEXOL 300 MG/ML IV SOLN  Comparison: None.  Findings: Normal cervical lordosis.  No evidence of fracture or dislocation.  Vertebral body heights are maintained.  The dens appears intact.  No prevertebral soft tissue swelling.  Mild multilevel degenerative changes.  No epidural abscess/fluid collection is seen.  There is a possible linear filling defect within the right common carotid artery (series 606/image  69, series 605/image 4), and although this may reflect artifact at the thoracic inlet, a focal dissection cannot be excluded.  IMPRESSION: Apparent linear filling defect within the right common carotid artery, possibly artifactual, although focal dissection cannot be excluded.  Consider CTA/MRA neck for further  characterization as clinically warranted.  No evidence of fracture or dislocation.  No epidural abscess/fluid collection is seen.  Critical Value/emergent results were called by telephone at the time of interpretation on 09/07/2011  at 1810 hours  to  Dr Delford Field, who verbally acknowledged these results.  Original Report Authenticated By: Charline Bills, M.D.   Dg Chest Port 1 View  09/08/2011  *RADIOLOGY REPORT*  Clinical Data: Respiratory distress.  PORTABLE CHEST - 1 VIEW  Comparison: 09/06/2011  Findings: Cardiomegaly.  Mild vascular congestion.  Interstitial prominence could reflect interstitial edema.  This is increased slightly since prior study.  No effusions.  No acute bony abnormality.  IMPRESSION: Increasing interstitial prominence, question interstitial edema.  Original Report Authenticated By: Cyndie Chime, M.D.    Medications: I have reviewed the patient's current medications.  Assessment/Plan:  Patient Active Hospital Problem List:  Subarachnoid hemorrhage (09/08/2011)   Assessment: patient has a frontal subarachnoid bleed which is increasing in size as per radiology reports. Patient does not give a history of fall.Etiology likely warfarin coagulopathy   Plan: No anticoagulation for now. Follow up CT head without contrast tomorrow.  Sepsis (09/06/2011)   Assessment: Seems to be improving at this time. Strep pneumoniae isolated.   Plan: Per critical care  Encephalopathy acute (09/06/2011)   Assessment: Resolved at this time. Likely related to sepsis   Plan: Continue treatment.  Muscle weakness of lower extremity  A: Unlikely related to embolic stroke given ankle and knee joints have normal strength. A spinal abscess is a posibilty but again less likely for the same reason as above. Plan: Will discuss with attending regarding further course of action. Electrolytes and other common labs are normal at this time. Rheumatological workup can be considered if this remains  unresolved after PT/OT.Hold warfarin for 1 week at least but this increases risk of thromboembolism from mechanical heart valve but anticoagulation is risky in setting of SAH and I explained this to patient and family and they understand.   LOS: 2 days   Lars Mage MD [facility] Resident Pager [phone] I have personally examined this patient, reviewed chart and pertinent data and discussed with patient and family and answered questions. Delia Heady, MD 09/08/2011  9:13 AM    Patient needs follow up coagulation labs today and if INR is greater than 1.6 may need reversal with FFP.Also needs TEE to evaluate for prosthetic valve endocarditis. D/W Anders Simmonds CCM PA

## 2011-09-08 NOTE — Consult Note (Signed)
Infectious Diseases Initial Consultation                Day 3 ceftriaxone          Date of Admission:  09/06/2011  Date of Consult:  09/08/2011  Reason for Consult: Pneumococcal bacteremia Referring Physician: Dr. Kalman Shan   Problem List:  Principal Problem:  *Subarachnoid hemorrhage Active Problems:  Pneumococcal septicemia  Encephalopathy acute  Flu-like symptoms  UTI (lower urinary tract infection)  Atrial fibrillation with rapid ventricular response  S/P mitral valve replacement  Muscle weakness of lower extremity  Presence of permanent cardiac pacemaker   Recommendations: 1. Increase ceftriaxone to 2 g IV every 12 hours pending pneumococcal susceptibilities 2. Agree with transesophageal echocardiogram.  3. I will be out of town tomorrow. Please call Dr. Darlina Sicilian [phone]) for any questions in my absence. I will followup on December 20.  Assessment: Her acute illness is due to pneumococcal bacteremia. So far there is no evidence for endocarditis involving her native valves, [employer] mitral valve replacement or pacemaker wires. However I certainly agree with a transesophageal echocardiogram to get better imaging. With her neck and back pain I am concerned about the possibility of concurrent meningitis but with her full anticoagulation I would not recommend reversal for lumbar puncture since she is improving and the results of a lumbar puncture are unlikely to alter our medical management. I doubt that her back pain is due to vertebral infection but if the severe back pain persists I would consider further imaging of her lumbar spine. For now I would increase her ceftriaxone to meningitis dosing. Duration of therapy will depend on her clinical progress and the results of her transesophageal echocardiogram. If she continues to improve and the echocardiogram does not show evidence of endocarditis and I will plan on treating her for a total of 2 weeks. If she has evidence of  endocarditis or persistent clinical illness then she will need 4-6 weeks of total therapy.    HPI: Carrie [name] is a 70 y.o. female who was in relatively good health until about 3 and half weeks ago when she developed influenza-like illness. This seemed to resolve spontaneously and she thought she was back to normal until about 3 days before she was admitted to the hospital on December 16. She states that she became extremely fatigued and developed rigors. She also noted some generalized weakness and intermittent, severe neck and low back pain. She rapidly became lethargic and incoherent. Her husband became appropriately concerned and had her brought to the hospital where she was admitted with a temperature of 102 and confusion.  Her initial 2 sets of blood cultures have grown pneumococcus. Her temperature has started to come down and her confusion has nearly completely resolved. She continues to have intermittent, severe back pain.   Review of Systems: Constitutional: positive for chills, fevers and sweats, negative for weight loss Ears, nose, mouth, throat, and face: positive for eyes rolling back in her head the night of admission, negative for earaches, hoarseness, nasal congestion, sore mouth and sore throat Respiratory: negative for cough, dyspnea on exertion and wheezing Cardiovascular: positive for irregular heart beat, negative for chest pain, chest pressure/discomfort, lower extremity edema and syncope Gastrointestinal: negative for abdominal pain, constipation, diarrhea, nausea and vomiting Genitourinary:negative for dysuria and urinary incontinence     . antiseptic oral rinse  15 mL Mouth Rinse q12n4p  . cefTRIAXone (ROCEPHIN)  IV  2 g Intravenous Q12H  . chlorhexidine  15  mL Mouth Rinse BID  . famotidine  20 mg Oral BID  . furosemide  20 mg Intravenous Once  . metoprolol tartrate  25 mg Oral BID  . potassium chloride  40 mEq Oral Q4H  . potassium chloride  40 mEq Oral Once  .  potassium chloride  40 mEq Oral Q4H  . vancomycin  500 mg Intravenous Q12H  . DISCONTD: azithromycin  500 mg Intravenous Q24H  . DISCONTD: cefTRIAXone (ROCEPHIN)  IV  2 g Intravenous Q24H  . DISCONTD: oseltamivir  75 mg Oral BID    Past Medical History  Diagnosis Date  . Hypertension   . Atrial fibrillation     normal coroonaries cath 2004. Dr Clarene [facility]   . Sick sinus syndrome     Dr Sharrell Ku. EP study negative for inducibel arrythmia. ? pacer since 2007  . Hyperlipidemia   . Arthritis   . Anxiety   . Diverticula of colon   . Pneumonia 2009    resolved.? OPD Rx    History  Substance Use Topics  . Smoking status: Never Smoker   . Smokeless tobacco: Not on file  . Alcohol Use: No    No family history on file. Allergies  Allergen Reactions  . Codeine Nausea And Vomiting    OBJECTIVE: Blood pressure 135/71, pulse 114, temperature 100.6 F (38.1 C), temperature source Oral, resp. rate 24, height 5\' 1"  (1.549 m), weight 60.8 kg (134 lb 0.6 oz), SpO2 96.00%. General: She is alert and conversant and in no distress. She is visiting with her husband and friends. She has some nuchal rigidity and pain with neck flexion. She also seems to have significant pain in her lower back when trying to sit up. Skin: She has no splinter or conjunctival hemorrhages. She has no rash. Her left upper chest pacemaker appears normal without any signs of infection. Lungs: Clear Cor: She has an irregularly irregular rhythm. She has a 1-2/6 systolic murmur heard best at the right upper sternal border. Abdomen: Soft and nontender.    Results for orders placed during the hospital encounter of 09/06/11 (from the past 48 hour(s))  MRSA PCR SCREENING     Status: Normal   Collection Time   09/06/11  6:46 PM      Component Value Range Comment   MRSA by PCR NEGATIVE  NEGATIVE    STREP PNEUMONIAE URINARY ANTIGEN     Status: Normal   Collection Time   09/06/11  7:47 PM      Component Value Range  Comment   Strep Pneumo Urinary Antigen NEGATIVE  NEGATIVE    LEGIONELLA ANTIGEN, URINE     Status: Normal   Collection Time   09/06/11  7:48 PM      Component Value Range Comment   Specimen Description URINE, CATHETERIZED      Special Requests NONE      Legionella Antigen, Urine Negative for Legionella pneumophilia serogroup 1      Report Status 09/07/2011 FINAL     HEPARIN LEVEL (UNFRACTIONATED)     Status: Abnormal   Collection Time   09/06/11  8:05 PM      Component Value Range Comment   Heparin Unfractionated 0.10 (*) 0.30 - 0.70 (IU/mL)   AMYLASE     Status: Abnormal   Collection Time   09/06/11  8:05 PM      Component Value Range Comment   Amylase 157 (*) 0 - 105 (U/L)   CBC     Status:  Abnormal   Collection Time   09/06/11  8:05 PM      Component Value Range Comment   WBC 24.5 (*) 4.0 - 10.5 (K/uL)    RBC 3.96  3.87 - 5.11 (MIL/uL)    Hemoglobin 11.7 (*) 12.0 - 15.0 (g/dL)    HCT 14.7 (*) 82.9 - 46.0 (%)    MCV 85.4  78.0 - 100.0 (fL)    MCH 29.5  26.0 - 34.0 (pg)    MCHC 34.6  30.0 - 36.0 (g/dL)    RDW 56.2 (*) 13.0 - 15.5 (%)    Platelets 127 (*) 150 - 400 (K/uL)   CARDIAC PANEL(CRET KIN+CKTOT+MB+TROPI)     Status: Normal   Collection Time   09/06/11  8:05 PM      Component Value Range Comment   Total CK 116  7 - 177 (U/L)    CK, MB 2.3  0.3 - 4.0 (ng/mL)    Troponin I <0.30  <0.30 (ng/mL)    Relative Index 2.0  0.0 - 2.5    PHOSPHORUS     Status: Abnormal   Collection Time   09/06/11  8:05 PM      Component Value Range Comment   Phosphorus 1.7 (*) 2.3 - 4.6 (mg/dL)   MAGNESIUM     Status: Normal   Collection Time   09/06/11  8:05 PM      Component Value Range Comment   Magnesium 2.0  1.5 - 2.5 (mg/dL)   LIPASE, BLOOD     Status: Abnormal   Collection Time   09/06/11  8:05 PM      Component Value Range Comment   Lipase 122 (*) 11 - 59 (U/L)   LACTIC ACID, PLASMA     Status: Normal   Collection Time   09/06/11  8:05 PM      Component Value Range  Comment   Lactic Acid, Venous 1.1  0.5 - 2.2 (mmol/L)   TSH     Status: Normal   Collection Time   09/06/11  8:05 PM      Component Value Range Comment   TSH 0.453  0.350 - 4.500 (uIU/mL)   CORTISOL     Status: Normal   Collection Time   09/06/11  8:05 PM      Component Value Range Comment   Cortisol, Plasma 45.6     INFLUENZA PANEL BY PCR     Status: Normal   Collection Time   09/06/11  8:09 PM      Component Value Range Comment   Influenza A By PCR NEGATIVE  NEGATIVE     Influenza B By PCR NEGATIVE  NEGATIVE     H1N1 flu by pcr NOT DETECTED  NOT DETECTED    CBC     Status: Abnormal   Collection Time   09/07/11  3:20 AM      Component Value Range Comment   WBC 24.5 (*) 4.0 - 10.5 (K/uL)    RBC 4.51  3.87 - 5.11 (MIL/uL)    Hemoglobin 13.5  12.0 - 15.0 (g/dL)    HCT 86.5  78.4 - 69.6 (%)    MCV 84.9  78.0 - 100.0 (fL)    MCH 29.9  26.0 - 34.0 (pg)    MCHC 35.2  30.0 - 36.0 (g/dL)    RDW 29.5 (*) 28.4 - 15.5 (%)    Platelets 120 (*) 150 - 400 (K/uL)   BASIC METABOLIC PANEL     Status: Abnormal  Collection Time   09/07/11  3:20 AM      Component Value Range Comment   Sodium 136  135 - 145 (mEq/L)    Potassium 3.0 (*) 3.5 - 5.1 (mEq/L) DELTA CHECK NOTED   Chloride 104  96 - 112 (mEq/L)    CO2 21  19 - 32 (mEq/L)    Glucose, Bld 134 (*) 70 - 99 (mg/dL)    BUN 11  6 - 23 (mg/dL) DELTA CHECK NOTED   Creatinine, Ser 0.59  0.50 - 1.10 (mg/dL) DELTA CHECK NOTED   Calcium 8.7  8.4 - 10.5 (mg/dL)    GFR calc non Af Amer >90  >90 (mL/min)    GFR calc Af Amer >90  >90 (mL/min)   MAGNESIUM     Status: Normal   Collection Time   09/07/11  3:20 AM      Component Value Range Comment   Magnesium 1.8  1.5 - 2.5 (mg/dL)   PHOSPHORUS     Status: Normal   Collection Time   09/07/11  3:20 AM      Component Value Range Comment   Phosphorus 2.3  2.3 - 4.6 (mg/dL)   HEPARIN LEVEL (UNFRACTIONATED)     Status: Abnormal   Collection Time   09/07/11  3:30 AM      Component Value Range  Comment   Heparin Unfractionated 0.18 (*) 0.30 - 0.70 (IU/mL)   HEPARIN LEVEL (UNFRACTIONATED)     Status: Normal   Collection Time   09/07/11  1:41 PM      Component Value Range Comment   Heparin Unfractionated 0.48  0.30 - 0.70 (IU/mL)   CBC     Status: Abnormal   Collection Time   09/08/11  3:09 AM      Component Value Range Comment   WBC 21.1 (*) 4.0 - 10.5 (K/uL)    RBC 4.06  3.87 - 5.11 (MIL/uL)    Hemoglobin 12.1  12.0 - 15.0 (g/dL)    HCT 16.1 (*) 09.6 - 46.0 (%)    MCV 84.5  78.0 - 100.0 (fL)    MCH 29.8  26.0 - 34.0 (pg)    MCHC 35.3  30.0 - 36.0 (g/dL)    RDW 04.5 (*) 40.9 - 15.5 (%)    Platelets 120 (*) 150 - 400 (K/uL)   HEPARIN LEVEL (UNFRACTIONATED)     Status: Abnormal   Collection Time   09/08/11  3:09 AM      Component Value Range Comment   Heparin Unfractionated <0.10 (*) 0.30 - 0.70 (IU/mL)   BASIC METABOLIC PANEL     Status: Abnormal   Collection Time   09/08/11  3:09 AM      Component Value Range Comment   Sodium 135  135 - 145 (mEq/L)    Potassium 3.0 (*) 3.5 - 5.1 (mEq/L)    Chloride 106  96 - 112 (mEq/L)    CO2 21  19 - 32 (mEq/L)    Glucose, Bld 111 (*) 70 - 99 (mg/dL)    BUN 11  6 - 23 (mg/dL)    Creatinine, Ser 8.11  0.50 - 1.10 (mg/dL)    Calcium 8.7  8.4 - 10.5 (mg/dL)    GFR calc non Af Amer >90  >90 (mL/min)    GFR calc Af Amer >90  >90 (mL/min)   PROCALCITONIN     Status: Normal   Collection Time   09/08/11  3:09 AM      Component Value Range  Comment   Procalcitonin 3.70     PROTIME-INR     Status: Abnormal   Collection Time   09/08/11  2:02 PM      Component Value Range Comment   Prothrombin Time 29.9 (*) 11.6 - 15.2 (seconds)    INR 2.79 (*) 0.00 - 1.49    TYPE AND SCREEN     Status: Normal (Preliminary result)   Collection Time   09/08/11  4:10 PM      Component Value Range Comment   ABO/RH(D) A POS      Antibody Screen PENDING      Sample Expiration 09/11/2011         Component Value Date/Time   SDES URINE, CATHETERIZED  09/06/2011 1948   SPECREQUEST NONE 09/06/2011 1948   CULT        BLOOD CULTURE RECEIVED NO GROWTH TO DATE CULTURE WILL BE HELD FOR 5 DAYS BEFORE ISSUING A FINAL NEGATIVE REPORT 09/06/2011 1610   REPTSTATUS 09/07/2011 FINAL 09/06/2011 1948   Ct Angio Head W/cm &/or Wo Cm  09/08/2011  *RADIOLOGY REPORT*  Clinical Data:  2-week history of fever and malaise.  Possible right common carotid artery dissections seen on CT cervical spine.  CT ANGIOGRAPHY HEAD AND NECK  Technique:  Multidetector CT imaging of the head and neck was performed using the standard protocol during bolus administration of intravenous contrast.  Multiplanar CT image reconstructions including MIPs were obtained to evaluate the vascular anatomy. Carotid stenosis measurements (when applicable) are obtained utilizing NASCET criteria, using the distal internal carotid diameter as the denominator.  Contrast: OMNIPAQUE IOHEXOL 350 MG/ML IV SOLN an  Comparison:  CT head and C-spine earlier today.  CTA NECK  Findings:  There is reflux of contrast into the left internal jugular vein.  This may be related to the left sided pacemaker placement.  There is fair opacification of the craniocerebral vasculature.  Atheromatous change can be seen in the transverse arch.  There is considerable Hounsfield artifact from the pacemaker battery pack. There is no visible proximal great vessel dissection or stenosis.  Both carotid bifurcations are widely patent.  Mild nonstenotic calcific atheromatous change can be seen on the right.  Cervical internal carotid arteries tortuous but patent without dissection. Both vertebrals are patent and equal in size through the neck. Possible 15 x 16 mm right thyroid nodule.  Consider thyroid ultrasound for further evaluation.  Lung apices clear.   Review of the MIP images confirms the above findings.  IMPRESSION: No visible proximal great vessel dissection within limits of detection by artifact from pacemaker battery pack.  No  evidence for flow reducing lesion in the right or left carotid system.  CTA HEAD  Findings:  Calcific nonstenotic atheromatous change of the skull base, cavernous, and supraclinoid internal carotid arteries.  No visible proximal stenosis of the anterior, middle, or posterior cerebral arteries.  Basilar artery patent without focal stenosis. No visible intracranial aneurysm.  Post infusion imaging of the head reveals no abnormal enhancement.  Chronic left basal ganglia infarct redemonstrated.  Left frontal subarachnoid blood appears slightly greater than was seen earlier. It is possible a component of this increased conspicuity relates to enhancement postcontrast. Recommend repeat noncontrast examination for continued surveillance.   Review of the MIP images confirms the above findings.  IMPRESSION: No proximal carotid or basilar stenosis.  No visible intracranial dissection.  Suspect slight increase in left frontal subarachnoid blood. I cannot completely exclude that this increased prominence relates to a component of  enhancement Recommend repeat noncontrast imaging in 24-48 hours.  Original Report Authenticated By: Elsie Stain, M.D.   Ct Head Wo Contrast  09/07/2011  *RADIOLOGY REPORT*  Clinical Data: Headaches  CT HEAD WITHOUT CONTRAST  Technique:  Contiguous axial images were obtained from the base of the skull through the vertex without contrast.  Comparison: [facility] brain dated 11/10/2005  Findings: Tiny hyperdense foci overlying the left frontal lobe (series 2/images 14 and 22) and left medial frontal lobe (series 2/image 19), possibly reflecting subarachnoid hemorrhage.  No mass lesion, mass effect, or midline shift.  No CT evidence of acute infarction.  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  Subcortical white matter and periventricular small vessel ischemic changes.  Mildly age related atrophy.  No ventriculomegaly.  The visualized paranasal sinuses are essentially clear. The mastoid air  cells are unopacified.  No evidence of calvarial fracture.  IMPRESSION: Possible tiny foci of subarachnoid hemorrhage overlying the left frontal lobe, as described above.  Old lacunar infarct with small vessel ischemic changes  Critical Value/emergent results were called by telephone at the time of interpretation on 09/07/2011  at 1810 hours  to  Dr Delford Field, who verbally acknowledged these results.  Original Report Authenticated By: Charline Bills, M.D.   Ct Angio Neck W/cm &/or Wo/cm  09/08/2011  *RADIOLOGY REPORT*  Clinical Data:  2-week history of fever and malaise.  Possible right common carotid artery dissections seen on CT cervical spine.  CT ANGIOGRAPHY HEAD AND NECK  Technique:  Multidetector CT imaging of the head and neck was performed using the standard protocol during bolus administration of intravenous contrast.  Multiplanar CT image reconstructions including MIPs were obtained to evaluate the vascular anatomy. Carotid stenosis measurements (when applicable) are obtained utilizing NASCET criteria, using the distal internal carotid diameter as the denominator.  Contrast: OMNIPAQUE IOHEXOL 350 MG/ML IV SOLN an  Comparison:  CT head and C-spine earlier today.  CTA NECK  Findings:  There is reflux of contrast into the left internal jugular vein.  This may be related to the left sided pacemaker placement.  There is fair opacification of the craniocerebral vasculature.  Atheromatous change can be seen in the transverse arch.  There is considerable Hounsfield artifact from the pacemaker battery pack. There is no visible proximal great vessel dissection or stenosis.  Both carotid bifurcations are widely patent.  Mild nonstenotic calcific atheromatous change can be seen on the right.  Cervical internal carotid arteries tortuous but patent without dissection. Both vertebrals are patent and equal in size through the neck. Possible 15 x 16 mm right thyroid nodule.  Consider thyroid ultrasound for further  evaluation.  Lung apices clear.   Review of the MIP images confirms the above findings.  IMPRESSION: No visible proximal great vessel dissection within limits of detection by artifact from pacemaker battery pack.  No evidence for flow reducing lesion in the right or left carotid system.  CTA HEAD  Findings:  Calcific nonstenotic atheromatous change of the skull base, cavernous, and supraclinoid internal carotid arteries.  No visible proximal stenosis of the anterior, middle, or posterior cerebral arteries.  Basilar artery patent without focal stenosis. No visible intracranial aneurysm.  Post infusion imaging of the head reveals no abnormal enhancement.  Chronic left basal ganglia infarct redemonstrated.  Left frontal subarachnoid blood appears slightly greater than was seen earlier. It is possible a component of this increased conspicuity relates to enhancement postcontrast. Recommend repeat noncontrast examination for continued surveillance.   Review of  the MIP images confirms the above findings.  IMPRESSION: No proximal carotid or basilar stenosis.  No visible intracranial dissection.  Suspect slight increase in left frontal subarachnoid blood. I cannot completely exclude that this increased prominence relates to a component of enhancement Recommend repeat noncontrast imaging in 24-48 hours.  Original Report Authenticated By: Elsie Stain, M.D.   Ct Cervical Spine W Contrast  09/07/2011  *RADIOLOGY REPORT*  Clinical Data: C-spine tenderness, evaluate for fracture or abscess  CT CERVICAL SPINE WITH CONTRAST  Technique:  Multidetector CT imaging of the cervical spine was performed during intravenous contrast administration. Multiplanar CT image reconstructions were also generated.  Contrast: OMNIPAQUE IOHEXOL 300 MG/ML IV SOLN  Comparison: None.  Findings: Normal cervical lordosis.  No evidence of fracture or dislocation.  Vertebral body heights are maintained.  The dens appears intact.  No prevertebral  soft tissue swelling.  Mild multilevel degenerative changes.  No epidural abscess/fluid collection is seen.  There is a possible linear filling defect within the right common carotid artery (series 606/image 69, series 605/image 4), and although this may reflect artifact at the thoracic inlet, a focal dissection cannot be excluded.  IMPRESSION: Apparent linear filling defect within the right common carotid artery, possibly artifactual, although focal dissection cannot be excluded.  Consider CTA/MRA neck for further characterization as clinically warranted.  No evidence of fracture or dislocation.  No epidural abscess/fluid collection is seen.  Critical Value/emergent results were called by telephone at the time of interpretation on 09/07/2011  at 1810 hours  to  Dr Delford Field, who verbally acknowledged these results.  Original Report Authenticated By: Charline Bills, M.D.   Dg Chest Port 1 View  09/08/2011  *RADIOLOGY REPORT*  Clinical Data: Respiratory distress.  PORTABLE CHEST - 1 VIEW  Comparison: 09/06/2011  Findings: Cardiomegaly.  Mild vascular congestion.  Interstitial prominence could reflect interstitial edema.  This is increased slightly since prior study.  No effusions.  No acute bony abnormality.  IMPRESSION: Increasing interstitial prominence, question interstitial edema.  Original Report Authenticated By: Cyndie Chime, M.D.    Cliffton Asters, MD [facility] [phone] pager   [phone] cell 09/08/2011, 6:05 PM

## 2011-09-09 ENCOUNTER — Inpatient Hospital Stay (HOSPITAL_COMMUNITY): Payer: Medicare Other

## 2011-09-09 DIAGNOSIS — Z7901 Long term (current) use of anticoagulants: Secondary | ICD-10-CM

## 2011-09-09 LAB — CBC
HCT: 31.7 % — ABNORMAL LOW (ref 36.0–46.0)
MCHC: 35 g/dL (ref 30.0–36.0)
MCV: 85.9 fL (ref 78.0–100.0)
Platelets: 132 10*3/uL — ABNORMAL LOW (ref 150–400)
RDW: 15.7 % — ABNORMAL HIGH (ref 11.5–15.5)
WBC: 18.7 10*3/uL — ABNORMAL HIGH (ref 4.0–10.5)

## 2011-09-09 LAB — RESPIRATORY VIRUS PANEL
Adenovirus E: NOT DETECTED
Bocavirus: NOT DETECTED
CoronavirusOC43: NOT DETECTED
Coxsackie and Echovirus: NOT DETECTED
Metapneumovirus: NOT DETECTED
Parainfluenza 2: NOT DETECTED
Parainfluenza 3: NOT DETECTED
Respiratory Syncytial Virus B: NOT DETECTED

## 2011-09-09 LAB — TYPE AND SCREEN: Antibody Screen: NEGATIVE

## 2011-09-09 LAB — CULTURE, BLOOD (ROUTINE X 2): Culture  Setup Time: 201212161919

## 2011-09-09 LAB — BASIC METABOLIC PANEL
BUN: 15 mg/dL (ref 6–23)
Chloride: 106 mEq/L (ref 96–112)
Creatinine, Ser: 0.73 mg/dL (ref 0.50–1.10)
GFR calc Af Amer: >90 mL/min (ref >90)
GFR calc non Af Amer: 85 mL/min — ABNORMAL LOW (ref >90)
Potassium: 3.8 mEq/L (ref 3.5–5.1)

## 2011-09-09 MED ORDER — SIMVASTATIN 80 MG PO TABS
80.0000 mg | ORAL_TABLET | Freq: Every day | ORAL | Status: DC
Start: 2011-09-09 — End: 2011-09-09

## 2011-09-09 MED ORDER — ROSUVASTATIN CALCIUM 40 MG PO TABS
40.0000 mg | ORAL_TABLET | Freq: Every day | ORAL | Status: DC
  Administered 2011-09-09 – 2011-09-16 (×7): 40 mg via ORAL
  Filled 2011-09-09 (×11): qty 1

## 2011-09-09 MED ORDER — VITAMIN K1 10 MG/ML IJ SOLN
10.0000 mg | Freq: Once | INTRAMUSCULAR | Status: AC
  Administered 2011-09-09: 10 mg via SUBCUTANEOUS
  Filled 2011-09-09: qty 1

## 2011-09-09 MED ORDER — DIGOXIN 250 MCG PO TABS
0.2500 mg | ORAL_TABLET | Freq: Three times a day (TID) | ORAL | Status: AC
  Administered 2011-09-09 – 2011-09-10 (×3): 0.25 mg via ORAL
  Filled 2011-09-09 (×4): qty 1

## 2011-09-09 MED ORDER — DIGOXIN 125 MCG PO TABS
0.1250 mg | ORAL_TABLET | Freq: Every day | ORAL | Status: DC
  Administered 2011-09-11 – 2011-09-30 (×20): 0.125 mg via ORAL
  Filled 2011-09-09 (×20): qty 1

## 2011-09-09 NOTE — Progress Notes (Signed)
eLink Physician-Brief Progress Note Patient Name: TAKIRA SHERRIN DOB: 03-03-1941 MRN: [id number]  Date of Service  09/09/2011   HPI/Events of Note   Pt only got 1 of ordered 2 units ffp,  Pt now transferred to 3100 from [facility]  eICU Interventions  See orders for two more units ffp   Intervention Category Intermediate Interventions:  (blood products)  [name] 09/09/2011, 6:18 PM

## 2011-09-09 NOTE — Progress Notes (Signed)
Patient name: Carrie [name] Medical record number: [id number] Date of birth: April 22, 1941 Age: 70 y.o. Gender: female PCP: Geraldo Pitter, MD, Dr Clarene [facility]  Date: 09/09/2011 Reason for Consult: sepsis, flu syndrome, delirium  Referring Physician: DR Lynnette Caffey ER Brief history  70 year old female who presented on 12/16 with 3 day history of fever and decreased energy. In the emergency room she was febrile 104 Fahrenheit, tachycardic to 130, high white count and therefore meeting SIRS criteria. Pro-calcitonin elevated. Urine analysis possibly abnormal. Chest x-ray with some abnormalities suggestive of viral pneumonitis or increased interstitial prominence. She's received 1 g Rocephin.  PCCM admitted for concern of sepsis. (of note had flue like symptoms ~3 weeks before which she had improved from).   Lines/tubes None  Culture data/sepsis markers Legionella antigen 12/17: negative Urine strep 12/17: negative Flu PCR: negative Influenza A and B. nasopharyngeal swab antibody >>NEG MRSA PCR: negative Multiplex respiratory virus panel 18 viruses PCR 09/06/11 >> Blood culture 12/17: STREPTOCOCCUS PNEUMONIAE Urine culture 12/17>>> Pro-calcitonin 09/06/2011 is 8 Lactate 09/06/2011 is less than 2  Antibiotics Ceftriaxone 2 g 12/16>>> Azithromycin 12/16>>>12/18 Tamiflu 12/16>>>12/18 Vancomycin 12/17 >>  Best practice Pepcid IV heparin drip for a to fibrillation  Protocols/consults None [facility] 12/17 Neuro 12/18 ID 12/18  Events/studies Echo 12/17: Left ventricle: Systolic function was vigorous. The estimated ejection fraction was in the range of 65% to 70%. Although no diagnostic regional wall motion abnormality was identified, this possibility cannot be completely excluded on the basis of this study. Mitral prosthesis and arrhythmia prevent evaluation of LV diastolic function. 12/17: CT neck:Apparent linear filling defect within the right common carotid artery, possibly  artifactual, although focal dissection cannot be excluded. Consider CTA/MRA neck for further characterization as clinically warranted.No evidence of fracture or dislocation.No epidural abscess/fluid collection is seen. 12/17: CT head:Suspect slight increase in left frontal subarachnoid blood. Icannot completely exclude that this increased prominence relates to a component of enhancement Recommend repeat noncontrast imaging in24-48 hours. 12/18: CT neck: No visible proximal great vessel dissection within limits of detection by artifact from pacemaker battery pack. No evidence for flow reducing lesion in the right or left carotid system. 12/18: No proximal carotid or basilar stenosis. No visible intracranial dissection. 12/18: Suspect slight increase in left frontal subarachnoid blood. I cannot completely exclude that this increased prominence relates to  a component of enhancement Recommend repeat noncontrast imaging in 24-48 hours. 12/20: TEE ([facility])>>> 12/19: CT head: Compared to the 09/07/2011 noncontrast CT examination. There has been an increase in amount of subarachnoid blood in the left frontal region. Etiology of this is indeterminate.   Subjective No distress, still has neck pain.   Objective Temp:  [98.3 F (36.8 C)-100.6 F (38.1 C)] 98.3 F (36.8 C) (12/19 0800) Pulse Rate:  [69-145] 90  (12/19 1200) Resp:  [19-36] 25  (12/19 1200) BP: (106-155)/(52-91) 143/65 mmHg (12/19 1200) SpO2:  [94 %-100 %] 100 % (12/19 1200) Weight:  [59.8 kg (131 lb 13.4 oz)] 131 lb 13.4 oz (59.8 kg) (12/19 0500)    Intake/Output Summary (Last 24 hours) at 09/09/11 1312 Last data filed at 09/09/11 1000  Gross per 24 hour  Intake   2186 ml  Output   2050 ml  Net    136 ml   Physical exam Temp:  [98.3 F (36.8 C)-100.6 F (38.1 C)] 98.3 F (36.8 C) (12/19 0800) Pulse Rate:  [69-145] 90  (12/19 1200) Resp:  [19-36] 25  (12/19 1200) BP: (106-155)/(52-91) 143/65 mmHg (12/19 1200)  SpO2:  [94  %-100 %] 100 % (12/19 1200) Weight:  [59.8 kg (131 lb 13.4 oz)] 131 lb 13.4 oz (59.8 kg) (12/19 0500)  General: 70 year old female still a little confused with generalized pain. Specifically over neck and C spine.  HENT: mucous membrane moist, no JVD. She is tender to palpation of posterior C spine area but can easily bend neck without discomfort.  Pulm: clear, no accessory muscle use Card: irregular  irregular Abd: non-tender, no organomegally + bowel sounds GU: foley cath to straight drain Neuro: awake, oriented to person and place but still confused.   radiology  pcxr: no sig change. Still has bibasilar volume loss.   LAB RESULT  Lab 09/09/11 0517 09/08/11 0309 09/07/11 0320  NA 138 135 136  K 3.8 3.0* 3.0*  CL 106 106 104  CO2 24 21 21   BUN 15 11 11   CREATININE 0.73 0.60 0.59  GLUCOSE 109* 111* 134*    Lab 09/09/11 0517 09/08/11 0309 09/07/11 0320  HGB 11.1* 12.1 13.5  HCT 31.7* 34.3* 38.3  WBC 18.7* 21.1* 24.5*  PLT 132* 120* 120*    Assessment and Plan  SIRS/ Sepsis, in setting of Pneumococcal bacteremia.  Lab 09/09/11 0517 09/08/11 0309 09/07/11 0320  WBC 18.7* 21.1* 24.5*  Plan: -cont current antibiotics as directed by ID -follow up on pending culture data -keep euvolemic -id following -TEE for Thursday by [facility]  Encephalopathy acute: in setting of sepsis. Possible encephalitis. Also has newly identified [facility] 12/17. Neuro following. Still need normal INR especially as her SAH is bigger.  Lab Results  Component Value Date   INR 2.03* 09/09/2011   INR 2.79* 09/08/2011   INR 2.15* 09/06/2011  Plan: -supportive care -no heparin for now -FFP for coagulopathy and also vitamin K -spoke to stoke service. No indication for surgical intervention but Dr Pearlean Brownie felt there would be benefit for her to be transferred to [facility] ICU for closer follow up by stroke service.   Atrial fibrillation with rapid ventricular response.  History of mitral valve  replacement Plan: -keep euvolemic -resume lopressor for now, may also need to resume cardiazem -resume cardiazem -no heparin for now  Hypokalemia  Lab 09/09/11 0517 09/08/11 0309 09/07/11 0320  K 3.8 3.0* 3.0*  plan: -replace and recheck    Reesha Debes,PETE 09/09/2011, 1:12 PM

## 2011-09-09 NOTE — Progress Notes (Signed)
Patient ID: Carrie [name], female   DOB: 07-Feb-1941, 70 y.o.   MRN: [id number] Physician Discharge Summary  Patient ID: Carrie [name] MRN: [id number] DOB/AGE: [birth year] 70 y.o.  Admit date: 09/06/2011 Discharge date: 09/09/2011  Admission Diagnoses:  Diagnoses at time of transfer:  Principal Problem:  *Encephalopathy acute, - More likely related to Subarchnoid hemorrhage and sepsis  - Subarachnoid hemorrhage on CT this adm * Pneumococcal septicemia: admission diagnosis. Following flu like symptoms 3 weeks earlier   Atrial fibrillation with rapid ventricular response  S/P mitral valve replacement, [employer] Jude and  Presence of permanent cardiac pacemaker. Hx of normal cath 2004   - Chronic anticoagulation, ( INR goal 2.5-3.5 under normal circumstances for ST Jude MVR)  - held this admission due to subarchnoid hemorrhage  Muscle weakness of lower extremity  Patient Details:    Carrie [name] is an 70 y.o. female.  Lines/tubes  None  Culture data/sepsis markers  Legionella antigen 12/17: negative  Urine strep 12/17: negative  Flu PCR: negative  Influenza A and B. nasopharyngeal swab antibody >>NEG  MRSA PCR: negative  Multiplex respiratory virus panel 18 viruses PCR 09/06/11 >>  Blood culture 12/17: STREPTOCOCCUS PNEUMONIAE  Urine culture 12/17>>>  Pro-calcitonin 09/06/2011 is 8  Lactate 09/06/2011 is less than 2  Antibiotics  Ceftriaxone 2 g 12/18>>> Azithromycin 12/16>>>12/18  Tamiflu 12/16>>>12/18  Vancomycin 12/17 >> 12/18  Best practice  Pepcid  IV heparin drip for a to fibrillation   Protocols/consults  None  [facility] 12/17  Neuro 12/18  ID 12/18   Events/studies  Echo 12/17: Left ventricle: Systolic function was vigorous. The estimated ejection fraction was in the range of 65% to 70%. Although no diagnostic regional wall motion abnormality was identified, this possibility cannot be completely excluded on the basis of this study. Mitral prosthesis and arrhythmia  prevent evaluation of LV diastolic function.  12/17: CT neck:Apparent linear filling defect within the right common carotid artery, possibly artifactual, although focal dissection cannot be excluded. Consider CTA/MRA neck for further characterization as clinically warranted.No evidence of fracture or dislocation.No epidural abscess/fluid collection is seen. 12/17: CT head:Suspect slight increase in left frontal subarachnoid blood. Icannot completely exclude that this increased prominence relates to a component of enhancement Recommend repeat noncontrast imaging in24-48 hours. 12/18: CT neck: No visible proximal great vessel dissection within limits of detection by artifact from pacemaker battery pack. No evidence for flow reducing lesion in the right or left carotid system. 12/18: No proximal carotid or basilar stenosis. No visible intracranial dissection. 12/18: Suspect slight increase in left frontal subarachnoid blood. I cannot completely exclude that this increased prominence relates to a component of enhancement Recommend repeat noncontrast imaging in 24-48 hours. 12/20: TEE ([facility])>>>  12/19: CT head: Compared to the 09/07/2011 noncontrast CT examination. There has been an increase in amount of subarachnoid blood in the left frontal region. Etiology of this is indeterminate.    Brief history  70 year old female who presented on 12/16 with 3 day history of fever and decreased energy. In the emergency room she was febrile 104 Fahrenheit, tachycardic to 130, high white count and therefore meeting SIRS criteria. Pro-calcitonin elevated. Urine analysis possibly abnormal. Chest x-ray with some abnormalities suggestive of viral pneumonitis or increased interstitial prominence. She's received 1 g Rocephin. PCCM admitted for concern of sepsis. (of note had flue like symptoms ~3 weeks before which she had improved from).   Hospital Course:  SIRS/ Sepsis, in setting of Pneumococcal bacteremia.  Identified by blood  cultures.   Lab  09/09/11 0517  09/08/11 0309  09/07/11 0320   WBC  18.7*  21.1*  24.5*   Plan:  -cont current antibiotics as directed by ID  -follow up on pending culture data  -keep euvolemic  -id following  -TEE for Thursday by [facility]   Encephalopathy acute: in setting of sepsis. Possible encephalitis. Also has newly identified [facility] 12/17. Neuro following. Still need normal INR especially as her SAH is bigger. Having increased neck discomfort.   Lab Results   Component  Value  Date    INR  2.03*  09/09/2011    INR  2.79*  09/08/2011    INR  2.15*  09/06/2011   Plan:  -supportive care  -no heparin for now  -FFP for coagulopathy and also vitamin K  -spoke to stoke service. No indication for surgical intervention but Dr Pearlean Brownie felt there would be benefit for her to be transferred to [facility] ICU for closer follow up by stroke service.   Atrial fibrillation with rapid ventricular response.  History of mitral valve replacement  Plan:  -keep euvolemic  -resume lopressor for now, may also need to resume cardiazem  -resume cardiazem  -no heparin for now  -[facility] following.   Hypokalemia   Lab  09/09/11 0517  09/08/11 0309  09/07/11 0320   K  3.8  3.0*  3.0*   plan:  -replace and recheck   Discharge Exam: Temp: [98.3 F (36.8 C)-100.6 F (38.1 C)] 98.3 F (36.8 C) (12/19 0800)  Pulse Rate: [69-145] 90 (12/19 1200)  Resp: [19-36] 25 (12/19 1200)  BP: (106-155)/(52-91) 143/65 mmHg (12/19 1200)  SpO2: [94 %-100 %] 100 % (12/19 1200)  Weight: [59.8 kg (131 lb 13.4 oz)] 131 lb 13.4 oz (59.8 kg) (12/19 0500)    Intake/Output Summary (Last 24 hours) at 09/09/11 1312 Last data filed at 09/09/11 1000   Gross per 24 hour   Intake  2186 ml   Output  2050 ml   Net  136 ml    Physical exam  Temp: [98.3 F (36.8 C)-100.6 F (38.1 C)] 98.3 F (36.8 C) (12/19 0800)  Pulse Rate: [69-145] 90 (12/19 1200)  Resp: [19-36] 25  (12/19 1200)  BP: (106-155)/(52-91) 143/65 mmHg (12/19 1200)  SpO2: [94 %-100 %] 100 % (12/19 1200)  Weight: [59.8 kg (131 lb 13.4 oz)] 131 lb 13.4 oz (59.8 kg) (12/19 0500)  General: 70 year old female still a little confused with generalized pain. Specifically over neck and C spine.  HENT: mucous membrane moist, no JVD. She is tender to palpation of posterior C spine area but can easily bend neck without discomfort.  Pulm: clear, no accessory muscle use  Card: irregular irregular  Abd: non-tender, no organomegally + bowel sounds  GU: foley cath to straight drain  Neuro: awake, oriented to person and place but still confused.   Labs at time of transfer Lab Results  Component Value Date   CREATININE 0.73 09/09/2011   BUN 15 09/09/2011   NA 138 09/09/2011   K 3.8 09/09/2011   CL 106 09/09/2011   CO2 24 09/09/2011   Lab Results  Component Value Date   WBC 18.7* 09/09/2011   HGB 11.1* 09/09/2011   HCT 31.7* 09/09/2011   MCV 85.9 09/09/2011   PLT 132* 09/09/2011   Lab Results  Component Value Date   ALT 16 09/06/2011   AST 46* 09/06/2011   ALKPHOS 97 09/06/2011   BILITOT 1.0  09/06/2011   Lab Results  Component Value Date   INR 2.03* 09/09/2011   INR 2.79* 09/08/2011   INR 2.15* 09/06/2011    Current radiology studies at time of transfer  09/09/2011  *RADIOLOGY REPORT*  Clinical Data: Followup subarachnoid hemorrhage.  CT HEAD WITHOUT CONTRAST  Technique:  Contiguous axial images were obtained from the base of the skull through the vertex without contrast.  Comparison: 09/07/2011 CT and 11/10/2005 MR.  Findings: Compared to the 09/07/2011 noncontrast CT examination. There has been an increase in amount of subarachnoid blood in the left frontal region.  Etiology of this is indeterminate.  Result of venous thrombosis / infarct not excluded.  Nonspecific white matter type changes most notable left frontal region.  Remote left caudate head infarct with encephalomalacia. This is  new compared to 2007 MR. No CT evidence of large acute infarct.  Small acute infarct cannot be excluded by CT.  No intracranial mass lesion detected on this unenhanced exam.  IMPRESSION: Compared to the 09/07/2011 noncontrast CT examination.  There has been an increase in amount of subarachnoid blood in the left frontal region.  Etiology of this is indeterminate. Please see above.  Original Report Authenticated By: Fuller Canada, M.D.   09/09/2011  *RADIOLOGY REPORT*  Clinical Data: No edema, evaluate endotracheal tube position  PORTABLE CHEST - 1 VIEW  Comparison: Portable chest x-ray of 09/08/2011  Findings: No endotracheal tube is visible.  There is little change in poor aeration and basilar volume loss.  Cardiomegaly is stable. 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  IMPRESSION: No endotracheal tube.  No change in poor aeration with basilar volume loss.  Original Report Authenticated By: Juline Patch, M.D.    Disposition: for transfer to [facility] ICU at [facility].      Marland Kitchen antiseptic oral rinse  15 mL Mouth Rinse q12n4p  . cefTRIAXone (ROCEPHIN)  IV  2 g Intravenous Q12H  . chlorhexidine  15 mL Mouth Rinse BID  . famotidine  20 mg Oral BID  . metoprolol tartrate  25 mg Oral BID  . phytonadione  10 mg Subcutaneous Once  . potassium chloride  40 mEq Oral Q4H    Follow-up Information    Follow up with BLAND,VEITA J .         Discharged Condition: critical  Signed: BABCOCK,PETE 09/09/2011, 1:48 PM   STAFF NOTE: I, Dr Lavinia Sharps have personally reviewed patient's available data, including medical history, events of note, physical examination and test results as part of my evaluation. I have discussed with NP and other care providers such as pharmacist, RN and RRT.  In addition,  I personally evaluated patient and elicited key findings of pneumococcal bacteremia as admission diagnoses following flulike symptoms several weeks earlier. At this point in time fever curve has improved  with pneumococcal bacteremia treatment. Infectious diseases is supporting antibiotic therapy. Patient had encephalopathy mild to moderate from the time of admission. One day after admission she developed some nuchal pain and tenderness at which time subarachnoid hemorrhage was diagnosed. At this point in time 09/09/2011 encephalopathy seems to have improved but the degree of subarachnoid hemorrhage on CT scan is slightly worse despite FFP correction of a high INR. Her anticoagulations for mitral valve and atrial  fibrillation is now on indefinite hold. We will give her more FFP today . We feel she would benefit from transfer to a neuro critical care intensive care unit; prefer destination is [facility] ICU at [facility].  This has been discussed with neurology who will continue to follow the patient there. In addition we'll start her on 3 weeks of simvastatin for prophylaxis against vasospasm and subarachnoid hemorrhage . Patient will be on the service of [facility] unit. We do not think meningitis is the likely etiology for her encephalopathy.  Rest per NP/medical resident whose note is outlined above and that I agree with  The patient is critically ill with multiple organ systems failure and requires high complexity decision making for assessment and support, frequent evaluation and titration of therapies, application of advanced monitoring technologies and extensive interpretation of multiple databases.   Critical Care Time devoted to patient care services described in this note is  60  minutes. Which also involves discharge planning

## 2011-09-09 NOTE — Progress Notes (Signed)
Subjective:  Awake and alert  Objective:  Vital Signs in the last 24 hours: Temp:  [98.3 F (36.8 C)-100.6 F (38.1 C)] 98.3 F (36.8 C) (12/19 0800) Pulse Rate:  [69-145] 99  (12/19 1130) Resp:  [19-36] 24  (12/19 1130) BP: (106-155)/(52-91) 155/85 mmHg (12/19 1100) SpO2:  [94 %-100 %] 98 % (12/19 1130) Weight:  [59.8 kg (131 lb 13.4 oz)] 131 lb 13.4 oz (59.8 kg) (12/19 0500)  Intake/Output from previous day:  Intake/Output Summary (Last 24 hours) at 09/09/11 1140 Last data filed at 09/09/11 0800  Gross per 24 hour  Intake   2506 ml  Output   2825 ml  Net   -319 ml    Physical Exam: General appearance: alert, cooperative and no distress Lungs: decreased breath sounds Heart: irregular rate and rhythm and positive valve sounds   Rate: 98  Rhythm: atrial fibrilation  Lab Results:  Basename 09/09/11 0517 09/08/11 0309  WBC 18.7* 21.1*  HGB 11.1* 12.1  PLT 132* 120*    Basename 09/09/11 0517 09/08/11 0309  NA 138 135  K 3.8 3.0*  CL 106 106  CO2 24 21  GLUCOSE 109* 111*  BUN 15 11  CREATININE 0.73 0.60    Basename 09/06/11 2005  TROPONINI <0.30   Hepatic Function Panel  Basename 09/06/11 1200  PROT 8.1  ALBUMIN 2.9*  AST 46*  ALT 16  ALKPHOS 97  BILITOT 1.0  BILIDIR --  IBILI --   No results found for this basename: CHOL in the last 72 hours  Basename 09/09/11 0517  INR 2.03*    ImagingCTA HEAD  :Suspect slight increase in left frontal subarachnoid blood. I  cannot completely exclude that this increased prominence relates to  a component of enhancement Recommend repeat noncontrast imaging in  24-48 hours.    Cardiac Studies:  Assessment/Plan:   Principal Problem:  *Encephalopathy acute, ? bacterial meningitis  Active Problems:  Pneumococcal septicemia  Subarachnoid hemorrhage on CT this adm  Atrial fibrillation with rapid ventricular response  S/P mitral valve replacement, [employer] Jude  Chronic anticoagulation, ( INR goal 2.5-3.5 under  normal circumstances for ST Jude MVR)  Presence of permanent cardiac pacemaker  Flu-like symptoms  UTI (lower urinary tract infection)  Muscle weakness of lower extremity  Plan-Will discuss with MD. At some point need some direction about Coumadin. TEE 12/20 scheduled for 11am. CT of head done today, results pending.    Corine Shelter PA-C 09/09/2011, 11:40 AM   Heart rate 110 AF   Will add lanoxin.. TEE in am Will need Hep starting tomorrow due to Mechanical MV unless neuro feels strongly against this.  No evidence of splinter hemmorages

## 2011-09-09 NOTE — Progress Notes (Signed)
Subjective: Patient has no complaints.  Repeat head CT shows some increase in hemorrhage size.  INR remained elevated despite discontinuation of anticoagulants and FFP was given.  CTA of the head and neck are unremarkable.  Objective: Vital signs in last 24 hours: Temp:  [98 F (36.7 C)-102.4 F (39.1 C)] 98 F (36.7 C) (12/19 2030) Pulse Rate:  [69-145] 101  (12/19 2030) Resp:  [19-30] 19  (12/19 2030) BP: (106-168)/(52-91) 125/56 mmHg (12/19 2030) SpO2:  [95 %-100 %] 98 % (12/19 2000) Weight:  [59.8 kg (131 lb 13.4 oz)] 131 lb 13.4 oz (59.8 kg) (12/19 0500)  Intake/Output from previous day: 12/18 0701 - 12/19 0700 In: 3196 [I.V.:2250; Blood:696; IV Piggyback:250] Out: 3110 [Urine:3110] Intake/Output this shift: Total I/O In: 290 [P.O.:240; I.V.:50] Out: 150 [Urine:150] Nutritional status: Cardiac  Neurologic Exam: Mental Status: Alert, oriented, thought content appropriate.  Speech fluent without evidence of aphasia.  Able to follow 3 step commands without difficulty. Cranial Nerves: II: visual fields grossly normal, pupils equal, round, reactive to light and accommodation III,IV, VI: ptosis not present, extra-ocular motions intact bilaterally V,VII: smile symmetric, facial light touch sensation normal bilaterally VIII: hearing normal bilaterally IX,X: gag reflex present XI: trapezius strength/neck flexion strength normal bilaterally XII: tongue strength normal  Motor: Patient is able to lift all extremities off the bed without any focality noted.   Tone and bulk:normal tone throughout; no atrophy noted Sensory: Pinprick and light touch intact throughout, bilaterally Deep Tendon Reflexes: 2+ and symmetric throughout Plantars: Right: downgoing   Left: downgoing Cerebellar: normal finger-to-nose and normal heel-to-shin test  Lab Results:  Basename 09/09/11 0517 09/08/11 0309  WBC 18.7* 21.1*  HGB 11.1* 12.1  HCT 31.7* 34.3*  PLT 132* 120*  NA 138 135  K 3.8 3.0*    CL 106 106  CO2 24 21  GLUCOSE 109* 111*  BUN 15 11  CREATININE 0.73 0.60  CALCIUM 8.9 8.7  LABA1C -- --   Studies/Results: Ct Angio Head W/cm &/or Wo Cm  09/08/2011  *RADIOLOGY REPORT*  Clinical Data:  2-week history of fever and malaise.  Possible right common carotid artery dissections seen on CT cervical spine.  CT ANGIOGRAPHY HEAD AND NECK  Technique:  Multidetector CT imaging of the head and neck was performed using the standard protocol during bolus administration of intravenous contrast.  Multiplanar CT image reconstructions including MIPs were obtained to evaluate the vascular anatomy. Carotid stenosis measurements (when applicable) are obtained utilizing NASCET criteria, using the distal internal carotid diameter as the denominator.  Contrast: OMNIPAQUE IOHEXOL 350 MG/ML IV SOLN an  Comparison:  CT head and C-spine earlier today.  CTA NECK  Findings:  There is reflux of contrast into the left internal jugular vein.  This may be related to the left sided pacemaker placement.  There is fair opacification of the craniocerebral vasculature.  Atheromatous change can be seen in the transverse arch.  There is considerable Hounsfield artifact from the pacemaker battery pack. There is no visible proximal great vessel dissection or stenosis.  Both carotid bifurcations are widely patent.  Mild nonstenotic calcific atheromatous change can be seen on the right.  Cervical internal carotid arteries tortuous but patent without dissection. Both vertebrals are patent and equal in size through the neck. Possible 15 x 16 mm right thyroid nodule.  Consider thyroid ultrasound for further evaluation.  Lung apices clear.   Review of the MIP images confirms the above findings.  IMPRESSION: No visible proximal great vessel dissection within  limits of detection by artifact from pacemaker battery pack.  No evidence for flow reducing lesion in the right or left carotid system.  CTA HEAD  Findings:  Calcific  nonstenotic atheromatous change of the skull base, cavernous, and supraclinoid internal carotid arteries.  No visible proximal stenosis of the anterior, middle, or posterior cerebral arteries.  Basilar artery patent without focal stenosis. No visible intracranial aneurysm.  Post infusion imaging of the head reveals no abnormal enhancement.  Chronic left basal ganglia infarct redemonstrated.  Left frontal subarachnoid blood appears slightly greater than was seen earlier. It is possible a component of this increased conspicuity relates to enhancement postcontrast. Recommend repeat noncontrast examination for continued surveillance.   Review of the MIP images confirms the above findings.  IMPRESSION: No proximal carotid or basilar stenosis.  No visible intracranial dissection.  Suspect slight increase in left frontal subarachnoid blood. I cannot completely exclude that this increased prominence relates to a component of enhancement Recommend repeat noncontrast imaging in 24-48 hours.  Original Report Authenticated By: Elsie Stain, M.D.   Ct Head Wo Contrast  09/09/2011  *RADIOLOGY REPORT*  Clinical Data: Followup subarachnoid hemorrhage.  CT HEAD WITHOUT CONTRAST  Technique:  Contiguous axial images were obtained from the base of the skull through the vertex without contrast.  Comparison: 09/07/2011 CT and 11/10/2005 MR.  Findings: Compared to the 09/07/2011 noncontrast CT examination. There has been an increase in amount of subarachnoid blood in the left frontal region.  Etiology of this is indeterminate.  Result of venous thrombosis / infarct not excluded.  Nonspecific white matter type changes most notable left frontal region.  Remote left caudate head infarct with encephalomalacia. This is new compared to 2007 MR. No CT evidence of large acute infarct.  Small acute infarct cannot be excluded by CT.  No intracranial mass lesion detected on this unenhanced exam.  IMPRESSION: Compared to the 09/07/2011  noncontrast CT examination.  There has been an increase in amount of subarachnoid blood in the left frontal region.  Etiology of this is indeterminate. Please see above.  Original Report Authenticated By: Fuller Canada, M.D.   Ct Angio Neck W/cm &/or Wo/cm  09/08/2011  *RADIOLOGY REPORT*  Clinical Data:  2-week history of fever and malaise.  Possible right common carotid artery dissections seen on CT cervical spine.  CT ANGIOGRAPHY HEAD AND NECK  Technique:  Multidetector CT imaging of the head and neck was performed using the standard protocol during bolus administration of intravenous contrast.  Multiplanar CT image reconstructions including MIPs were obtained to evaluate the vascular anatomy. Carotid stenosis measurements (when applicable) are obtained utilizing NASCET criteria, using the distal internal carotid diameter as the denominator.  Contrast: OMNIPAQUE IOHEXOL 350 MG/ML IV SOLN an  Comparison:  CT head and C-spine earlier today.  CTA NECK  Findings:  There is reflux of contrast into the left internal jugular vein.  This may be related to the left sided pacemaker placement.  There is fair opacification of the craniocerebral vasculature.  Atheromatous change can be seen in the transverse arch.  There is considerable Hounsfield artifact from the pacemaker battery pack. There is no visible proximal great vessel dissection or stenosis.  Both carotid bifurcations are widely patent.  Mild nonstenotic calcific atheromatous change can be seen on the right.  Cervical internal carotid arteries tortuous but patent without dissection. Both vertebrals are patent and equal in size through the neck. Possible 15 x 16 mm right thyroid nodule.  Consider thyroid ultrasound  for further evaluation.  Lung apices clear.   Review of the MIP images confirms the above findings.  IMPRESSION: No visible proximal great vessel dissection within limits of detection by artifact from pacemaker battery pack.  No evidence for  flow reducing lesion in the right or left carotid system.  CTA HEAD  Findings:  Calcific nonstenotic atheromatous change of the skull base, cavernous, and supraclinoid internal carotid arteries.  No visible proximal stenosis of the anterior, middle, or posterior cerebral arteries.  Basilar artery patent without focal stenosis. No visible intracranial aneurysm.  Post infusion imaging of the head reveals no abnormal enhancement.  Chronic left basal ganglia infarct redemonstrated.  Left frontal subarachnoid blood appears slightly greater than was seen earlier. It is possible a component of this increased conspicuity relates to enhancement postcontrast. Recommend repeat noncontrast examination for continued surveillance.   Review of the MIP images confirms the above findings.  IMPRESSION: No proximal carotid or basilar stenosis.  No visible intracranial dissection.  Suspect slight increase in left frontal subarachnoid blood. I cannot completely exclude that this increased prominence relates to a component of enhancement Recommend repeat noncontrast imaging in 24-48 hours.  Original Report Authenticated By: Elsie Stain, M.D.   Dg Chest Port 1 View  09/09/2011  *RADIOLOGY REPORT*  Clinical Data: No edema, evaluate endotracheal tube position  PORTABLE CHEST - 1 VIEW  Comparison: Portable chest x-ray of 09/08/2011  Findings: No endotracheal tube is visible.  There is little change in poor aeration and basilar volume loss.  Cardiomegaly is stable. 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  IMPRESSION: No endotracheal tube.  No change in poor aeration with basilar volume loss.  Original Report Authenticated By: Juline Patch, M.D.   Dg Chest Port 1 View  09/08/2011  *RADIOLOGY REPORT*  Clinical Data: Respiratory distress.  PORTABLE CHEST - 1 VIEW  Comparison: 09/06/2011  Findings: Cardiomegaly.  Mild vascular congestion.  Interstitial prominence could reflect interstitial edema.  This is increased slightly  since prior study.  No effusions.  No acute bony abnormality.  IMPRESSION: Increasing interstitial prominence, question interstitial edema.  Original Report Authenticated By: Cyndie Chime, M.D.    Medications:  I have reviewed the patient's current medications. Scheduled:   . antiseptic oral rinse  15 mL Mouth Rinse q12n4p  . cefTRIAXone (ROCEPHIN)  IV  2 g Intravenous Q12H  . chlorhexidine  15 mL Mouth Rinse BID  . digoxin  0.125 mg Oral Daily  . digoxin  0.25 mg Oral Q8H  . famotidine  20 mg Oral BID  . metoprolol tartrate  25 mg Oral BID  . phytonadione  10 mg Subcutaneous Once  . rosuvastatin  40 mg Oral q1800  . DISCONTD: simvastatin  80 mg Oral q1800  . DISCONTD: vancomycin  500 mg Intravenous Q12H    Assessment/Plan:  Patient Active Hospital Problem List: Subarachnoid hemorrhage on CT this adm (09/08/2011)   Assessment: SAH enlarging by CT.  Coagulopathy reversed.  Etiology remains unclear.  Will need to rule out the possibility of emboli related to mitral valve.   Plan: 1. Recommend TEE            2. Remain off anticoagulants            3. Repeat head CT without contrast.on 12/20    LOS: 3 days   Thana Farr, MD [facility] [phone] 09/09/2011  9:11 PM

## 2011-09-10 ENCOUNTER — Encounter (HOSPITAL_COMMUNITY)
Admission: EM | Disposition: A | Discharge status: Skilled Nursing Facility | Payer: Self-pay | Source: Home / Self Care | Attending: Internal Medicine

## 2011-09-10 ENCOUNTER — Inpatient Hospital Stay (HOSPITAL_COMMUNITY): Payer: Medicare Other

## 2011-09-10 ENCOUNTER — Ambulatory Visit (HOSPITAL_COMMUNITY): Admit: 2011-09-10 | Payer: Self-pay | Admitting: Cardiovascular Disease

## 2011-09-10 DIAGNOSIS — I609 Nontraumatic subarachnoid hemorrhage, unspecified: Secondary | ICD-10-CM

## 2011-09-10 DIAGNOSIS — I4891 Unspecified atrial fibrillation: Secondary | ICD-10-CM

## 2011-09-10 DIAGNOSIS — G9340 Encephalopathy, unspecified: Secondary | ICD-10-CM

## 2011-09-10 DIAGNOSIS — A403 Sepsis due to Streptococcus pneumoniae: Secondary | ICD-10-CM

## 2011-09-10 DIAGNOSIS — J189 Pneumonia, unspecified organism: Secondary | ICD-10-CM

## 2011-09-10 DIAGNOSIS — I011 Acute rheumatic endocarditis: Secondary | ICD-10-CM

## 2011-09-10 LAB — PREPARE FRESH FROZEN PLASMA
Unit division: 0
Unit division: 0

## 2011-09-10 LAB — BASIC METABOLIC PANEL
Calcium: 9.3 mg/dL (ref 8.4–10.5)
GFR calc Af Amer: >90 mL/min (ref >90)
GFR calc non Af Amer: >90 mL/min (ref >90)
Glucose, Bld: 119 mg/dL — ABNORMAL HIGH (ref 70–99)
Potassium: 2.9 mEq/L — ABNORMAL LOW (ref 3.5–5.1)
Sodium: 135 mEq/L (ref 135–145)

## 2011-09-10 LAB — CBC
Platelets: 162 10*3/uL (ref 150–400)
RBC: 3.49 MIL/uL — ABNORMAL LOW (ref 3.87–5.11)
RDW: 15.1 % (ref 11.5–15.5)

## 2011-09-10 LAB — PROTIME-INR: Prothrombin Time: 17.4 seconds — ABNORMAL HIGH (ref 11.6–15.2)

## 2011-09-10 SURGERY — ECHOCARDIOGRAM, TRANSESOPHAGEAL
Anesthesia: Moderate Sedation

## 2011-09-10 MED ORDER — FENTANYL CITRATE 0.05 MG/ML IJ SOLN
50.0000 ug | Freq: Once | INTRAMUSCULAR | Status: AC
  Administered 2011-09-10: 50 ug via INTRAVENOUS
  Filled 2011-09-10: qty 2

## 2011-09-10 MED ORDER — TOPIRAMATE 25 MG PO TABS
25.0000 mg | ORAL_TABLET | Freq: Two times a day (BID) | ORAL | Status: DC
  Administered 2011-09-10 – 2011-09-17 (×15): 25 mg via ORAL
  Filled 2011-09-10 (×16): qty 1

## 2011-09-10 MED ORDER — DIPHENHYDRAMINE HCL 50 MG/ML IJ SOLN: 12.5000 mg | Freq: Four times a day (QID) | INTRAMUSCULAR | Status: DC | PRN

## 2011-09-10 MED ORDER — MIDAZOLAM HCL 2 MG/2ML IJ SOLN
2.0000 mg | Freq: Once | INTRAMUSCULAR | Status: AC
  Administered 2011-09-10: 2 mg via INTRAVENOUS

## 2011-09-10 MED ORDER — DIPHENHYDRAMINE HCL 12.5 MG/5ML PO ELIX
12.5000 mg | ORAL_SOLUTION | Freq: Four times a day (QID) | ORAL | Status: DC | PRN
  Filled 2011-09-10: qty 5

## 2011-09-10 MED ORDER — MIDAZOLAM HCL 2 MG/2ML IJ SOLN
INTRAMUSCULAR | Status: AC
  Administered 2011-09-10: 2 mg via INTRAVENOUS
  Filled 2011-09-10: qty 6

## 2011-09-10 MED ORDER — ONDANSETRON HCL 4 MG/2ML IJ SOLN
4.0000 mg | Freq: Four times a day (QID) | INTRAMUSCULAR | Status: DC | PRN
  Administered 2011-09-20: 4 mg via INTRAVENOUS
  Filled 2011-09-10: qty 2

## 2011-09-10 MED ORDER — SODIUM CHLORIDE 0.9 % IJ SOLN: 9.0000 mL | INTRAMUSCULAR | Status: DC | PRN

## 2011-09-10 MED ORDER — FENTANYL 10 MCG/ML IV SOLN
INTRAVENOUS | Status: DC
  Administered 2011-09-10: 30 ug via INTRAVENOUS
  Administered 2011-09-10: 16:00:00 via INTRAVENOUS
  Administered 2011-09-11: 135 ug via INTRAVENOUS
  Administered 2011-09-11 (×2): 15 ug via INTRAVENOUS
  Administered 2011-09-11 (×2): 45 ug via INTRAVENOUS
  Administered 2011-09-12: 75 ug via INTRAVENOUS
  Administered 2011-09-12: 60 ug via INTRAVENOUS
  Administered 2011-09-12: 75 ug via INTRAVENOUS
  Administered 2011-09-12: 05:00:00 via INTRAVENOUS
  Administered 2011-09-12: 45 ug via INTRAVENOUS
  Administered 2011-09-12 – 2011-09-13 (×2): 60 ug via INTRAVENOUS
  Administered 2011-09-13: 14:00:00 via INTRAVENOUS
  Administered 2011-09-13: 40 ug via INTRAVENOUS
  Administered 2011-09-13: 165 ug via INTRAVENOUS
  Administered 2011-09-13: 94.01 ug via INTRAVENOUS
  Administered 2011-09-13: 60 ug via INTRAVENOUS
  Administered 2011-09-13: 93.3 ug via INTRAVENOUS
  Administered 2011-09-14: 15 ug via INTRAVENOUS
  Administered 2011-09-14: 30 ug via INTRAVENOUS
  Administered 2011-09-14: 23:00:00 via INTRAVENOUS
  Administered 2011-09-14: 50 ug via INTRAVENOUS
  Administered 2011-09-14: 135 ug via INTRAVENOUS
  Administered 2011-09-15: 105 ug via INTRAVENOUS
  Administered 2011-09-15: 43.97 ug via INTRAVENOUS
  Administered 2011-09-16: 45 ug via INTRAVENOUS
  Administered 2011-09-16: 15 ug via INTRAVENOUS
  Administered 2011-09-16: 30 ug via INTRAVENOUS
  Administered 2011-09-17: 60 ug via INTRAVENOUS
  Administered 2011-09-17: 75 ug via INTRAVENOUS
  Administered 2011-09-17: 90 ug via INTRAVENOUS
  Administered 2011-09-17: 75 ug via INTRAVENOUS
  Administered 2011-09-17 (×2): 45 ug via INTRAVENOUS
  Administered 2011-09-17: 01:00:00 via INTRAVENOUS
  Administered 2011-09-18 (×3): 15 ug via INTRAVENOUS
  Administered 2011-09-18: 12.5 ug via INTRAVENOUS
  Administered 2011-09-18: 15 ug via INTRAVENOUS
  Administered 2011-09-18: 60 ug via INTRAVENOUS
  Administered 2011-09-19: 45 ug via INTRAVENOUS
  Filled 2011-09-10 (×5): qty 50

## 2011-09-10 MED ORDER — METOPROLOL TARTRATE 25 MG PO TABS
25.0000 mg | ORAL_TABLET | Freq: Two times a day (BID) | ORAL | Status: DC
  Administered 2011-09-10 – 2011-09-30 (×39): 25 mg via ORAL
  Filled 2011-09-10 (×43): qty 1

## 2011-09-10 MED ORDER — NALOXONE HCL 0.4 MG/ML IJ SOLN: 0.4000 mg | INTRAMUSCULAR | Status: DC | PRN

## 2011-09-10 MED ORDER — POTASSIUM CHLORIDE CRYS ER 20 MEQ PO TBCR
40.0000 meq | EXTENDED_RELEASE_TABLET | ORAL | Status: AC
  Administered 2011-09-10 (×2): 40 meq via ORAL
  Filled 2011-09-10 (×2): qty 1
  Filled 2011-09-10: qty 2

## 2011-09-10 MED ORDER — ZOLPIDEM TARTRATE 5 MG PO TABS
5.0000 mg | ORAL_TABLET | Freq: Once | ORAL | Status: AC
  Administered 2011-09-10: 5 mg via ORAL
  Filled 2011-09-10: qty 1

## 2011-09-10 NOTE — Progress Notes (Signed)
Stroke Team Progress Note  SUBJECTIVE  Carrie [name] is a 70 y.o. female who complains of bilat neck pain and upper back pain, right greater than left leg weakness. She also has small left frontal subarachnoid hemorrhage related to Warfarin related coagulopathy. She complains of headache which is 10 out of 10 but she looks a lot more comfortable.she has had her INR reversed with FFP and is 1.4 today. She has a mechanical heart valve as well as atrial fibrillation and was on therapeutic anticoagulation on admission. He has had MRI scan of the selective spine which has not shown any significant compression. The tiny subarachnoid hemorrhage his probably not enough to explain her leg weakness  OBJECTIVE Most recent Vital Signs: Temp: 98.1 F (36.7 C) (12/20 0800) Temp src: Oral (12/20 0800) BP: 116/57 mmHg (12/20 0800) Pulse Rate: 82  (12/20 0800) Respiratory Rate: 24 O2 Saturdation: 98%  CBG (last 3)  No results found for this basename: GLUCAP:3 in the last 72 hours Intake/Output from previous day: 12/19 0701 - 12/20 0700 In: 2711 [P.O.:800; I.V.:1162.5; Blood:648.5; IV Piggyback:100] Out: 2560 [Urine:2560]  IV Fluid Intake:      . sodium chloride 50 mL/hr at 09/10/11 0800   Medications    . antiseptic oral rinse  15 mL Mouth Rinse q12n4p  . cefTRIAXone (ROCEPHIN)  IV  2 g Intravenous Q12H  . chlorhexidine  15 mL Mouth Rinse BID  . digoxin  0.125 mg Oral Daily  . digoxin  0.25 mg Oral Q8H  . famotidine  20 mg Oral BID  . metoprolol tartrate  25 mg Oral BID  . phytonadione  10 mg Subcutaneous Once  . potassium chloride  40 mEq Oral Q1 Hr x 2  . rosuvastatin  40 mg Oral q1800  . zolpidem  5 mg Oral Once  . DISCONTD: simvastatin  80 mg Oral q1800  . DISCONTD: vancomycin  500 mg Intravenous Q12H   Diet:   NPO Activity:  Bedrest DVT Prophylaxis:  none  Studies: CBC    Component Value Date/Time   WBC 20.8* 09/10/2011 0430   WBC 7.7 11/14/2010 1311   WBC 7.6 03/14/2010 1318     RBC 3.49* 09/10/2011 0430   RBC 4.66 11/14/2010 1311   HGB 10.6* 09/10/2011 0430   HGB 14.2 11/14/2010 1311   HGB 13.1 03/14/2010 1318   HCT 30.0* 09/10/2011 0430   HCT 41.4 11/14/2010 1311   HCT 38.8 03/14/2010 1318   PLT 162 09/10/2011 0430   PLT 230 11/14/2010 1311   PLT 284 03/14/2010 1318   MCV 86.0 09/10/2011 0430   MCV 88.8 11/14/2010 1311   MCV 92 03/14/2010 1318   MCH 30.4 09/10/2011 0430   MCH 30.5 11/14/2010 1311   MCHC 35.3 09/10/2011 0430   MCHC 34.3 11/14/2010 1311   RDW 15.1 09/10/2011 0430   RDW 15.9* 11/14/2010 1311   LYMPHSABS 0.8 09/06/2011 1200   LYMPHSABS 2.3 11/14/2010 1311   MONOABS 1.4* 09/06/2011 1200   MONOABS 0.7 11/14/2010 1311   EOSABS 0.0 09/06/2011 1200   EOSABS 0.2 11/14/2010 1311   EOSABS 0.2 03/14/2010 1318   BASOSABS 0.0 09/06/2011 1200   BASOSABS 0.1 11/14/2010 1311   BASOSABS 0.1 03/14/2010 1318   CMP    Component Value Date/Time   NA 135 09/10/2011 0430   NA 138 03/14/2010 1318   K 2.9* 09/10/2011 0430   K 4.1 03/14/2010 1318   CL 100 09/10/2011 0430   CL 100 03/14/2010 1318   CO2  26 09/10/2011 0430   CO2 27 03/14/2010 1318   GLUCOSE 119* 09/10/2011 0430   GLUCOSE 101 03/14/2010 1318   BUN 11 09/10/2011 0430   BUN 14 03/14/2010 1318   CREATININE 0.61 09/10/2011 0430   CREATININE 0.8 03/14/2010 1318   CALCIUM 9.3 09/10/2011 0430   CALCIUM 9.9 03/14/2010 1318   PROT 8.1 09/06/2011 1200   PROT 8.8* 03/14/2010 1318   ALBUMIN 2.9* 09/06/2011 1200   AST 46* 09/06/2011 1200   AST 46* 03/14/2010 1318   ALT 16 09/06/2011 1200   ALKPHOS 97 09/06/2011 1200   ALKPHOS 125* 03/14/2010 1318   BILITOT 1.0 09/06/2011 1200   BILITOT 0.70 03/14/2010 1318   GFRNONAA >90 09/10/2011 0430   GFRAA >90 09/10/2011 0430   COAGS Lab Results  Component Value Date   INR 1.40 09/10/2011   INR 2.03* 09/09/2011   INR 2.79* 09/08/2011   Lipid Panel No results found for this basename: chol,  trig,  hdl,  cholhdl,  vldl,  ldlcalc   HgbA1C  No results found for this  basename: HGBA1C   Urine Drug Screen  No results found for this basename: labopia,  cocainscrnur,  labbenz,  amphetmu,  thcu,  labbarb    Alcohol Level No results found for this basename: eth   CT of the brain   09/10/2011  Small areas of subarachnoid hemorrhage over the convexity on the left have improved.  No new area of hemorrhage or infarction.   09/09/2011  Compared to the 09/07/2011 noncontrast CT examination.  There has been an increase in amount of subarachnoid blood in the left frontal region.  Etiology of this is indeterminate.  09/07/2011 Possible tiny foci of subarachnoid hemorrhage overlying the left frontal lobe, as described above. Old lacunar infarct with small vessel ischemic changes   CT angio neck   No visible proximal great vessel dissection within limits of detection by artifact from pacemaker battery pack. No evidence for flow reducing lesion in the right or left carotid system  CT angio head No proximal carotid or basilar stenosis. No visible intracranial dissection. Suspect slight increase in left frontal subarachnoid blood. I cannot completely exclude that this increased prominence relates to a component of enhancement Recommend repeat noncontrast imaging in 24-48 hours.   CT thoracic and lumbar spine ordered   MRI of the brain  Not done due to pacemaker  2D Echocardiogram  EF 65-70%, mitral prosthesis, atrial fibrillation, no obvious source of embolus.   TEE  ordered   CXR  No endotracheal tube.  No change in poor aeration with basilar volume loss.    EKG  atrial fibrillation   Physical Exam   Pleasant elderly African American lady who appears not to be in distress. She is currently afebrile.neck is supple but she complains of posterior neck pain. Cardiac exam no murmur or gallop. Lungs clear to auscultation. Distal pulses are felt.  Neurological exam awake alert oriented x3 with normal speech and language function. I moments are full range without nystagmus.  Face is symmetric without weakness. Tongue is midline. Motor system exam was symmetric and equal strength in upper extremities without focal weakness. No exudate exam reveals significant proximal right lower extremity weakness with 2-3/5 hip flexor and abductor and adductor strength. 4+ out of 5 strength at the knee extensors flexors as well as ankles. Mild 4/5 weakness of the left hip flexors with 4+ out of 5 strength of the left knee and ankles. Deep tendon reflexes are all brisk including  upper extremities.there is no ankle or knee clonus noted Plantars are both not elicitable. Is no sensory loss in the trunk. Finger-to-nose coordination is accurate knee to his is impaired on the left.  ASSESSMENT Carrie [name] is a 70 y.o. female with a left frontal hyperdensity, secondary to small subarachnoid hemorrhage likely related to warfarin  coagulopathy on therapeutic anticoagulation which has been reversed. 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 She has mechanical heart valve and history of atrial fibrillation and needs long-term anticoagulation which may now be tricky given subarachnoid hemorrhage on therapeutic anticoagulation. She had elevated white count and is getting transesophageal echocardiogram today to evaluate for prosthetic valve endocarditis Plan check CT scan of the thoracic and lumbar spine to rule out epidural hematoma or epidural abscess causing cord compression. Hold anticoagulation for at least a week may need to resume warfarin after that.check TEE today.Start Topamax 25 mg twice a day for neck pain and headache This is a complex and tricky situation with anticoagulation-related hemorrhage but patient clearly having indications for long-term anticoagulation. I discussed the plan of care with the patient and Dr.  Sung Amabile.This patient is critically ill and at significant risk of neurological worsening, death and care requires constant monitoring of vital signs, hemodynamics,respiratory and cardiac monitoring, neurological assessment, discussion with family, other specialists and medical decision making of high complexity. I spent 30 minutes of neurocritical care time  in the care of  this patient.  Hospital day # 4  TREATMENT/PLAN TEE today. CT lumbar and cervical spine  SHARON BIBY, AVNP, ANP-BC, GNP-BC [facility] Pager: [phone] 09/10/2011 8:48 AM  Dr. Delia Heady, Stroke Center Medical Director, has personally reviewed chart, pertinent data, examined the patient and developed the plan of care.

## 2011-09-10 NOTE — Progress Notes (Signed)
Subjective: Waking up from sedation from TEE  Objective: Weight change: 9 lb 14.7 oz (4.5 kg)  Intake/Output Summary (Last 24 hours) at 09/10/11 2045 Last data filed at 09/10/11 1800  Gross per 24 hour  Intake 1978.5 ml  Output   1675 ml  Net  303.5 ml   Blood pressure 137/75, pulse 102, temperature 100.6 F (38.1 C), temperature source Oral, resp. rate 21, height 5\' 1"  (1.549 m), weight 141 lb 12.1 oz (64.3 kg), SpO2 95.00%. Temp:  [97.7 F (36.5 C)-102.3 F (39.1 C)] 100.6 F (38.1 C) (12/20 1605) Pulse Rate:  [41-107] 102  (12/20 1800) Resp:  [19-38] 21  (12/20 1900) BP: (112-162)/(52-98) 137/75 mmHg (12/20 1900) SpO2:  [93 %-100 %] 95 % (12/20 1800) Weight:  [141 lb 12.1 oz (64.3 kg)] 141 lb 12.1 oz (64.3 kg) (12/20 0420)  Physical Exam: General: sleepy oriented to person, thinks she is still at [place]. HEENT: anicteric sclera, pupils reactive to light and accommodation, EOMI CVS irr irregular rate,ii /vi murmur  Chest: clear to auscultation bilaterally, no wheezing, rales or rhonchi Abdomen: soft nontender, nondistended, normal bowel sounds, Extremities: no  clubbing or edema noted bilaterally Skin: no rashes Lymph: no new lymphadenopathy Neuro: nonfocal  Lab Results:  Basename 09/10/11 0430 09/09/11 0517  WBC 20.8* 18.7*  HGB 10.6* 11.1*  HCT 30.0* 31.7*  PLT 162 132*   BMET  Basename 09/10/11 0430 09/09/11 0517  NA 135 138  K 2.9* 3.8  CL 100 106  CO2 26 24  GLUCOSE 119* 109*  BUN 11 15  CREATININE 0.61 0.73  CALCIUM 9.3 8.9    Micro Results: Recent Results (from the past 240 hour(s))  URINE CULTURE     Status: Normal   Collection Time   09/06/11 12:03 PM      Component Value Range Status Comment   Specimen Description URINE, CATHETERIZED   Final    Special Requests NONE   Final    Setup Time [id number]   Final    Colony Count NO GROWTH   Final    Culture NO GROWTH   Final    Report Status 09/08/2011 FINAL   Final   CULTURE, BLOOD (ROUTINE  X 2)     Status: Normal   Collection Time   09/06/11  1:30 PM      Component Value Range Status Comment   Specimen Description BLOOD LEFT URINE   Final    Special Requests NONE [facility] EACH   Final    Setup Time [id number]   Final    Culture     Final    Value: STREPTOCOCCUS PNEUMONIAE     Note: SUSCEPTIBILITIES PERFORMED ON PREVIOUS CULTURE WITHIN THE LAST 5 DAYS.     Note: Gram Stain Report Called to,Read Back By and Verified With: Eden Emms RN on 09/07/11 at 05:10 by Christie Nottingham   Report Status 09/09/2011 FINAL   Final   CULTURE, BLOOD (ROUTINE X 2)     Status: Normal   Collection Time   09/06/11  1:40 PM      Component Value Range Status Comment   Specimen Description BLOOD RIGHT HAND   Final    Special Requests NONE [facility]   Final    Setup Time [id number]   Final    Culture     Final    Value: STREPTOCOCCUS PNEUMONIAE     Note: Gram Stain Report Called to,Read Back By and Verified With: Eden Emms RN on 09/06/16 at 05:10  by Christie Nottingham   Report Status 09/09/2011 FINAL   Final    Organism ID, Bacteria STREPTOCOCCUS PNEUMONIAE   Final   CULTURE, BLOOD (ROUTINE X 2)     Status: Normal (Preliminary result)   Collection Time   09/06/11  4:00 PM      Component Value Range Status Comment   Specimen Description BLOOD RIGHT ARM   Final    Special Requests     Final    Value: Normal BOTTLES DRAWN AEROBIC AND ANAEROBIC 5CC EACH   Setup Time [id number]   Final    Culture     Final    Value:        BLOOD CULTURE RECEIVED NO GROWTH TO DATE CULTURE WILL BE HELD FOR 5 DAYS BEFORE ISSUING A FINAL NEGATIVE REPORT   Report Status PENDING   Incomplete   CULTURE, BLOOD (ROUTINE X 2)     Status: Normal (Preliminary result)   Collection Time   09/06/11  4:10 PM      Component Value Range Status Comment   Specimen Description BLOOD STAT BOTTLES DRAWN AEROBIC ONLY RIGHT HAND   Final    Special Requests Normal 5CC    Final    Setup Time [id number]   Final    Culture     Final     Value:        BLOOD CULTURE RECEIVED NO GROWTH TO DATE CULTURE WILL BE HELD FOR 5 DAYS BEFORE ISSUING A FINAL NEGATIVE REPORT   Report Status PENDING   Incomplete   MRSA PCR SCREENING     Status: Normal   Collection Time   09/06/11  6:46 PM      Component Value Range Status Comment   MRSA by PCR NEGATIVE  NEGATIVE  Final   INFLUENZA VIRUS AG, A+B (DFA)     Status: Normal   Collection Time   09/06/11  9:24 PM      Component Value Range Status Comment   Influenza Virus A and B Ag REPORT   Final   RESPIRATORY VIRUS PANEL (18 COMPONENTS)     Status: Normal   Collection Time   09/06/11  9:44 PM      Component Value Range Status Comment   Source - RVPAN SPUTUM   Final    Respiratory Syncytial Virus A NOT DETECTED   Final    Respiratory Syncytial Virus B NOT DETECTED   Final    Influenza A NOT DETECTED   Final    Influenza B NOT DETECTED   Final    Parainfluenza 1 NOT DETECTED   Final    Parainfluenza 2 NOT DETECTED   Final    Parainfluenza 3 NOT DETECTED   Final    Parainfluenza 4 NOT DETECTED   Final    Metapneumovirus NOT DETECTED   Final    [place]ox[place] and Echovirus NOT DETECTED   Final    Rhinovirus NOT DETECTED   Final    Adenovirus B NOT DETECTED   Final    Adenovirus E NOT DETECTED   Final    CoronavirusNL63 NOT DETECTED   Final    CoronavirusHKU1 NOT DETECTED   Final    Coronavirus229E NOT DETECTED   Final    CoronavirusOC43 NOT DETECTED   Final    Bocavirus NOT DETECTED   Final   CULTURE, BLOOD (ROUTINE X 2)     Status: Normal (Preliminary result)   Collection Time   09/07/11  4:00 PM  Component Value Range Status Comment   Specimen Description BLOOD LEFT HAND   Final    Special Requests BOTTLES DRAWN AEROBIC AND ANAEROBIC 5 CC EACH   Final    Setup Time [id number]   Final    Culture     Final    Value:        BLOOD CULTURE RECEIVED NO GROWTH TO DATE CULTURE WILL BE HELD FOR 5 DAYS BEFORE ISSUING A FINAL NEGATIVE REPORT   Report Status PENDING   Incomplete     CULTURE, BLOOD (ROUTINE X 2)     Status: Normal (Preliminary result)   Collection Time   09/07/11  4:05 PM      Component Value Range Status Comment   Specimen Description BLOOD RIGHT HAND   Final    Special Requests BOTTLES DRAWN AEROBIC AND ANAEROBIC 5 CC EACH   Final    Setup Time [id number]   Final    Culture     Final    Value:        BLOOD CULTURE RECEIVED NO GROWTH TO DATE CULTURE WILL BE HELD FOR 5 DAYS BEFORE ISSUING A FINAL NEGATIVE REPORT   Report Status PENDING   Incomplete   MRSA PCR SCREENING     Status: Normal   Collection Time   09/09/11  5:56 PM      Component Value Range Status Comment   MRSA by PCR NEGATIVE  NEGATIVE  Final     Studies/Results:   [facility]* *[facility]* [address] [place], [place] [address] [phone]  ------------------------------------------------------------ Transesophageal Echocardiography  Patient: Zhanae, Proffit MR #: [id number] Study Date: 09/10/2011 Gender: F Age: 58 Height: Weight: BSA: Pt. Status: Room:  SONOGRAPHER Kwong(Billy) Chung, RDCS PERFORMING Croitoru, Mihai cc:  ------------------------------------------------------------ LV EF: 60% - 65%  ------------------------------------------------------------ Study Conclusions  - Left ventricle: There was mild concentric hypertrophy. Systolic function was normal. The estimated ejection fraction was in the range of 60% to 65%. Wall motion was normal; there were no regional wall motion abnormalities. - Aortic valve: No evidence of vegetation. - Mitral valve: A mechanical prosthesis was present. There was an apparent, medium-sized, 7mm (L) x 3mm (W), pedunculated, solid, highly mobile vegetation on the anterior aspect of the annulus; the appearance is consistent with vegetation. - Left atrium: The atrium was dilated. No evidence of thrombus in the atrial cavity or appendage. - Right atrium: No evidence of thrombus in the  atrial cavity or appendage. - Atrial septum: No defect or patent foramen ovale was identified. - Pericardium, extracardiac: A trivial pericardial effusion was identified. There was a right pleural effusion. Recommendations: Consider serial repeat study. Transesophageal echocardiography. 2D and color Doppler.  ------------------------------------------------------------  ------------------------------------------------------------ Left ventricle: There was mild concentric hypertrophy. Systolic function was normal. The estimated ejection fraction was in the range of 60% to 65%. Wall motion was normal; there were no regional wall motion abnormalities.  ------------------------------------------------------------ Aortic valve: Structurally normal valve. Trileaflet. Cusp separation was normal. No evidence of vegetation. Doppler: No regurgitation.  ------------------------------------------------------------ Aorta: The aorta was not dilated and mildly diseased.  ------------------------------------------------------------ Mitral valve: A mechanical prosthesis was present. There was an apparent, medium-sized, 7mm (L) x 3mm (W), pedunculated, solid, highly mobile vegetation on the anterior aspect of the annulus; the appearance is consistent with vegetation. Less likely to be a thrombus. There is no evidence of a periannular abscess or perivalvular leak. There is physiological "backwash"  ------------------------------------------------------------ Left atrium: The atrium was dilated. No evidence  of thrombus in the atrial cavity or appendage.  ------------------------------------------------------------ Atrial septum: No defect or patent foramen ovale was identified.  ------------------------------------------------------------ Tricuspid valve: Poorly visualized.  ------------------------------------------------------------ Right atrium: The atrium was normal in size. Pacer  wires noted in right atrium. No vegetations are seen on the visualized portions of the leads. No evidence of thrombus in the atrial cavity or appendage.  ------------------------------------------------------------ Pericardium: A trivial pericardial effusion was identified.  ------------------------------------------------------------ Pleura: There was a right pleural effusion.  ------------------------------------------------------------ Post procedure conclusions Ascending Aorta:  - The aorta was not dilated and mildly diseased.     Ct Angio Head W/cm &/or Wo Cm  09/08/2011  *RADIOLOGY REPORT*  Clinical Data:  2-week history of fever and malaise.  Possible right common carotid artery dissections seen on CT cervical spine.  CT ANGIOGRAPHY HEAD AND NECK  Technique:  Multidetector CT imaging of the head and neck was performed using the standard protocol during bolus administration of intravenous contrast.  Multiplanar CT image reconstructions including MIPs were obtained to evaluate the vascular anatomy. Carotid stenosis measurements (when applicable) are obtained utilizing NASCET criteria, using the distal internal carotid diameter as the denominator.  Contrast: OMNIPAQUE IOHEXOL 350 MG/ML IV SOLN an  Comparison:  CT head and C-spine earlier today.  CTA NECK  Findings:  There is reflux of contrast into the left internal jugular vein.  This may be related to the left sided pacemaker placement.  There is fair opacification of the craniocerebral vasculature.  Atheromatous change can be seen in the transverse arch.  There is considerable Hounsfield artifact from the pacemaker battery pack. There is no visible proximal great vessel dissection or stenosis.  Both carotid bifurcations are widely patent.  Mild nonstenotic calcific atheromatous change can be seen on the right.  Cervical internal carotid arteries tortuous but patent without dissection. Both vertebrals are patent and equal in size  through the neck. Possible 15 x 16 mm right thyroid nodule.  Consider thyroid ultrasound for further evaluation.  Lung apices clear.   Review of the MIP images confirms the above findings.  IMPRESSION: No visible proximal great vessel dissection within limits of detection by artifact from pacemaker battery pack.  No evidence for flow reducing lesion in the right or left carotid system.  CTA HEAD  Findings:  Calcific nonstenotic atheromatous change of the skull base, cavernous, and supraclinoid internal carotid arteries.  No visible proximal stenosis of the anterior, middle, or posterior cerebral arteries.  Basilar artery patent without focal stenosis. No visible intracranial aneurysm.  Post infusion imaging of the head reveals no abnormal enhancement.  Chronic left basal ganglia infarct redemonstrated.  Left frontal subarachnoid blood appears slightly greater than was seen earlier. It is possible a component of this increased conspicuity relates to enhancement postcontrast. Recommend repeat noncontrast examination for continued surveillance.   Review of the MIP images confirms the above findings.  IMPRESSION: No proximal carotid or basilar stenosis.  No visible intracranial dissection.  Suspect slight increase in left frontal subarachnoid blood. I cannot completely exclude that this increased prominence relates to a component of enhancement Recommend repeat noncontrast imaging in 24-48 hours.  Original Report Authenticated By: Elsie Stain, M.D.   Dg Chest 2 View  09/06/2011  *RADIOLOGY REPORT*  Clinical Data: Rule out infiltrate. Confusion.  CHEST - 2 VIEW  Comparison: 02/20/2008  Findings:  Heart size appears mildly enlarged.  Prior median sternotomy CABG procedure.  Left chest wall pacer device is noted with lead in the right ventricle.  There are no pleural effusions or interstitial edema.  Chronic bronchitic changes are noted bilaterally.  IMPRESSION:  1.  Cardiac enlargement. 2.  No acute changes.   Original Report Authenticated By: Rosealee Albee, M.D.   Ct Head Wo Contrast  09/10/2011  *RADIOLOGY REPORT*  Clinical Data: Follow up subarachnoid hemorrhage  CT HEAD WITHOUT CONTRAST  Technique:  Contiguous axial images were obtained from the base of the skull through the vertex without contrast.  Comparison: 09/09/2011  Findings: Hyperdense subarachnoid hemorrhage over the convexity left frontal region has improved.  No new areas of hemorrhage are identified.  No subdural hemorrhage is present.  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  No acute infarct.  Negative for mass lesion.  IMPRESSION: Small areas of subarachnoid hemorrhage over the convexity on the left have improved.  No new area of hemorrhage or infarction.  Original Report Authenticated By: Camelia Phenes, M.D.   Ct Head Wo Contrast  09/09/2011  *RADIOLOGY REPORT*  Clinical Data: Followup subarachnoid hemorrhage.  CT HEAD WITHOUT CONTRAST  Technique:  Contiguous axial images were obtained from the base of the skull through the vertex without contrast.  Comparison: 09/07/2011 CT and 11/10/2005 MR.  Findings: Compared to the 09/07/2011 noncontrast CT examination. There has been an increase in amount of subarachnoid blood in the left frontal region.  Etiology of this is indeterminate.  Result of venous thrombosis / infarct not excluded.  Nonspecific white matter type changes most notable left frontal region.  Remote left caudate head infarct with encephalomalacia. This is new compared to 2007 MR. No CT evidence of large acute infarct.  Small acute infarct cannot be excluded by CT.  No intracranial mass lesion detected on this unenhanced exam.  IMPRESSION: Compared to the 09/07/2011 noncontrast CT examination.  There has been an increase in amount of subarachnoid blood in the left frontal region.  Etiology of this is indeterminate. Please see above.  Original Report Authenticated By: Fuller Canada, M.D.   Ct Head Wo  Contrast  09/07/2011  *RADIOLOGY REPORT*  Clinical Data: Headaches  CT HEAD WITHOUT CONTRAST  Technique:  Contiguous axial images were obtained from the base of the skull through the vertex without contrast.  Comparison: [facility] brain dated 11/10/2005  Findings: Tiny hyperdense foci overlying the left frontal lobe (series 2/images 14 and 22) and left medial frontal lobe (series 2/image 19), possibly reflecting subarachnoid hemorrhage.  No mass lesion, mass effect, or midline shift.  No CT evidence of acute infarction.  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  Subcortical white matter and periventricular small vessel ischemic changes.  Mildly age related atrophy.  No ventriculomegaly.  The visualized paranasal sinuses are essentially clear. The mastoid air cells are unopacified.  No evidence of calvarial fracture.  IMPRESSION: Possible tiny foci of subarachnoid hemorrhage overlying the left frontal lobe, as described above.  Old lacunar infarct with small vessel ischemic changes  Critical Value/emergent results were called by telephone at the time of interpretation on 09/07/2011  at 1810 hours  to  Dr Delford Field, who verbally acknowledged these results.  Original Report Authenticated By: Charline Bills, M.D.   Ct Angio Neck W/cm &/or Wo/cm  09/08/2011  *RADIOLOGY REPORT*  Clinical Data:  2-week history of fever and malaise.  Possible right common carotid artery dissections seen on CT cervical spine.  CT ANGIOGRAPHY HEAD AND NECK  Technique:  Multidetector CT imaging of the head and neck was performed using the standard protocol during bolus administration  of intravenous contrast.  Multiplanar CT image reconstructions including MIPs were obtained to evaluate the vascular anatomy. Carotid stenosis measurements (when applicable) are obtained utilizing NASCET criteria, using the distal internal carotid diameter as the denominator.  Contrast: OMNIPAQUE IOHEXOL 350 MG/ML IV SOLN an  Comparison:  CT head  and C-spine earlier today.  CTA NECK  Findings:  There is reflux of contrast into the left internal jugular vein.  This may be related to the left sided pacemaker placement.  There is fair opacification of the craniocerebral vasculature.  Atheromatous change can be seen in the transverse arch.  There is considerable Hounsfield artifact from the pacemaker battery pack. There is no visible proximal great vessel dissection or stenosis.  Both carotid bifurcations are widely patent.  Mild nonstenotic calcific atheromatous change can be seen on the right.  Cervical internal carotid arteries tortuous but patent without dissection. Both vertebrals are patent and equal in size through the neck. Possible 15 x 16 mm right thyroid nodule.  Consider thyroid ultrasound for further evaluation.  Lung apices clear.   Review of the MIP images confirms the above findings.  IMPRESSION: No visible proximal great vessel dissection within limits of detection by artifact from pacemaker battery pack.  No evidence for flow reducing lesion in the right or left carotid system.  CTA HEAD  Findings:  Calcific nonstenotic atheromatous change of the skull base, cavernous, and supraclinoid internal carotid arteries.  No visible proximal stenosis of the anterior, middle, or posterior cerebral arteries.  Basilar artery patent without focal stenosis. No visible intracranial aneurysm.  Post infusion imaging of the head reveals no abnormal enhancement.  Chronic left basal ganglia infarct redemonstrated.  Left frontal subarachnoid blood appears slightly greater than was seen earlier. It is possible a component of this increased conspicuity relates to enhancement postcontrast. Recommend repeat noncontrast examination for continued surveillance.   Review of the MIP images confirms the above findings.  IMPRESSION: No proximal carotid or basilar stenosis.  No visible intracranial dissection.  Suspect slight increase in left frontal subarachnoid blood. I  cannot completely exclude that this increased prominence relates to a component of enhancement Recommend repeat noncontrast imaging in 24-48 hours.  Original Report Authenticated By: Elsie Stain, M.D.   Ct Cervical Spine W Contrast  09/07/2011  *RADIOLOGY REPORT*  Clinical Data: C-spine tenderness, evaluate for fracture or abscess  CT CERVICAL SPINE WITH CONTRAST  Technique:  Multidetector CT imaging of the cervical spine was performed during intravenous contrast administration. Multiplanar CT image reconstructions were also generated.  Contrast: OMNIPAQUE IOHEXOL 300 MG/ML IV SOLN  Comparison: None.  Findings: Normal cervical lordosis.  No evidence of fracture or dislocation.  Vertebral body heights are maintained.  The dens appears intact.  No prevertebral soft tissue swelling.  Mild multilevel degenerative changes.  No epidural abscess/fluid collection is seen.  There is a possible linear filling defect within the right common carotid artery (series 606/image 69, series 605/image 4), and although this may reflect artifact at the thoracic inlet, a focal dissection cannot be excluded.  IMPRESSION: Apparent linear filling defect within the right common carotid artery, possibly artifactual, although focal dissection cannot be excluded.  Consider CTA/MRA neck for further characterization as clinically warranted.  No evidence of fracture or dislocation.  No epidural abscess/fluid collection is seen.  Critical Value/emergent results were called by telephone at the time of interpretation on 09/07/2011  at 1810 hours  to  Dr Delford Field, who verbally acknowledged these results.  Original Report  Authenticated By: Charline Bills, M.D.   Dg Chest Port 1 View  09/09/2011  *RADIOLOGY REPORT*  Clinical Data: No edema, evaluate endotracheal tube position  PORTABLE CHEST - 1 VIEW  Comparison: Portable chest x-ray of 09/08/2011  Findings: No endotracheal tube is visible.  There is little change in poor aeration and  basilar volume loss.  Cardiomegaly is stable. 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  IMPRESSION: No endotracheal tube.  No change in poor aeration with basilar volume loss.  Original Report Authenticated By: Juline Patch, M.D.   Dg Chest Port 1 View  09/08/2011  *RADIOLOGY REPORT*  Clinical Data: Respiratory distress.  PORTABLE CHEST - 1 VIEW  Comparison: 09/06/2011  Findings: Cardiomegaly.  Mild vascular congestion.  Interstitial prominence could reflect interstitial edema.  This is increased slightly since prior study.  No effusions.  No acute bony abnormality.  IMPRESSION: Increasing interstitial prominence, question interstitial edema.  Original Report Authenticated By: Cyndie Chime, M.D.    Antibiotics:  Anti-infectives     Start     Dose/Rate Route Frequency Ordered Stop   09/09/11 0300   cefTRIAXone (ROCEPHIN) 2 g in dextrose 5 % 50 mL IVPB        2 g 100 mL/hr over 30 Minutes Intravenous Every 12 hours 09/08/11 1800     09/07/11 1800   vancomycin (VANCOCIN) 500 mg in sodium chloride 0.9 % 100 mL IVPB  Status:  Discontinued        500 mg 100 mL/hr over 60 Minutes Intravenous Every 12 hours 09/07/11 1743 09/09/11 0940   09/06/11 1600   cefTRIAXone (ROCEPHIN) 2 g in dextrose 5 % 50 mL IVPB  Status:  Discontinued        2 g 100 mL/hr over 30 Minutes Intravenous Every 24 hours 09/06/11 1524 09/08/11 1800   09/06/11 1545   oseltamivir (TAMIFLU) capsule 75 mg  Status:  Discontinued        75 mg Oral 2 times daily 09/06/11 1524 09/08/11 0853   09/06/11 1530   azithromycin (ZITHROMAX) 500 mg in dextrose 5 % 250 mL IVPB  Status:  Discontinued        500 mg 250 mL/hr over 60 Minutes Intravenous Every 24 hours 09/06/11 1524 09/08/11 1531   09/06/11 1330   cefTRIAXone (ROCEPHIN) 1 g in dextrose 5 % 50 mL IVPB        1 g 100 mL/hr over 30 Minutes Intravenous  Once 09/06/11 1319 09/06/11 1449          Medications: Scheduled Meds:   . cefTRIAXone (ROCEPHIN)  IV  2 g  Intravenous Q12H  . digoxin  0.125 mg Oral Daily  . digoxin  0.25 mg Oral Q8H  . famotidine  20 mg Oral BID  . fentaNYL  50 mcg Intravenous Once  . fentaNYL   Intravenous Q4H  . metoprolol tartrate  25 mg Oral BID  . midazolam  2 mg Intravenous Once  . potassium chloride  40 mEq Oral Q1 Hr x 2  . rosuvastatin  40 mg Oral q1800  . topiramate  25 mg Oral BID  . zolpidem  5 mg Oral Once  . DISCONTD: antiseptic oral rinse  15 mL Mouth Rinse q12n4p  . DISCONTD: chlorhexidine  15 mL Mouth Rinse BID  . DISCONTD: metoprolol tartrate  25 mg Oral BID   Continuous Infusions:   . sodium chloride 20 mL/hr at 09/10/11 1800   PRN Meds:.sodium chloride, acetaminophen, diphenhydrAMINE, diphenhydrAMINE, naloxone,  ondansetron (ZOFRAN) IV, sodium chloride, DISCONTD: fentaNYL  Assessment/Plan: MARVEL SAPP is a 70 y.o. 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 We have not excluded meningitis.   Pneumococcal endocarditis +/- meningitis: -- I would make sure the patient gets a full two weeks of HIGH dose rocephin, followed by an additional 4 weeks of IV rocephin dosed at 2 grams per day --clearly the prosthetic valve  May need to be replaced, if this is done I would make sure cultures are sent from the valve and would also give therapy with rocephin for 6 weeks post surgery  I will sign off for now. Please call with further questions.    LOS: 4 days   Acey Lav 09/10/2011, 8:45 PM

## 2011-09-10 NOTE — Progress Notes (Signed)
eLink Physician-Brief Progress Note Patient Name: Carrie [name] DOB: [birth year] MRN: [id number]  Date of Service  09/10/2011   HPI/Events of Note   hypokalemia  eICU Interventions  Potassium replaced   Intervention Category Intermediate Interventions: Electrolyte abnormality - evaluation and management  DETERDING,ELIZABETH 09/10/2011, 6:08 AM

## 2011-09-10 NOTE — Progress Notes (Signed)
  Echocardiogram Echocardiogram Transesophageal has been performed.  Jorje Guild [place] 09/10/2011, 3:28 PM

## 2011-09-10 NOTE — Progress Notes (Signed)
eLink Physician-Brief Progress Note Patient Name: Carrie [name] DOB: 05-02-1941 MRN: [id number]  Date of Service  09/10/2011   HPI/Events of Note  Insomnia   eICU Interventions  One time dose of Ambien 5 mg for sleep   Intervention Category Minor Interventions: Routine modifications to care plan (e.g. PRN medications for pain, fever)  DETERDING,ELIZABETH 09/10/2011, 1:30 AM

## 2011-09-10 NOTE — Progress Notes (Signed)
Patient name: Carrie [name] Medical record number: [id number] Date of birth: 08-06-1941 Age: 70 y.o. Gender: female PCP: Geraldo Pitter, MD, Dr Clarene [facility]  Date: 09/10/2011 Reason for Consult: sepsis, flu syndrome, delirium  Referring Physician: DR Lynnette Caffey ER  PT PROFILE: 70 year old female who presented to [facility] ED 12/16 with 3 day history of high fever, fatigue, malaise, tachycardia, leukocytosis, elevated PCT and dx of severe sepsis. Has h/o prosthetic MV and chronic warfarin rx. Admission blood cx positive for pneumococcus. CT head 12/17 in eval of AMS revealed small SAH > anticoagulation reversed and transferred 12/19 to NICU to monitor neuro status more closely  Lines/tubes None  Culture data/sepsis markers Legionella antigen 12/17: NEG Urine strep 12/17: NEG Influenza A and B. nasopharyngeal swab antibody >>NEG MRSA PCR: NEG Multiplex respiratory virus panel 18 viruses PCR 09/06/11 >> NEG Blood culture 12/17: STREPTOCOCCUS PNEUMONIAE  Antibiotics Azithromycin 12/16>>>12/18 Tamiflu 12/16>>>12/18 Vancomycin 12/17 >> 12/19 Ceftriaxone 2 g 12/16 >>   Best practice Pepcid SCDs  Protocols/consults [facility] 12/17 Neuro 12/18 ID 12/18  Events/studies Echo 12/17: Normal EF, No vegetations seen 12/17: CT neck:Apparent linear filling defect within the right common carotid artery, possibly artifactual, although focal dissection cannot be excluded. Consider CTA/MRA neck for further characterization as clinically warranted.No evidence of fracture or dislocation.No epidural abscess/fluid collection is seen. 12/17: CT head:Suspect slight increase in left frontal subarachnoid blood. Icannot completely exclude that this increased prominence relates to a component of enhancement  12/18: CTA neck: No visible proximal great vessel dissection within limits of detection by artifact from pacemaker battery pack. No evidence for flow reducing lesion in the right or left carotid system. 12/18  CT head: Suspect slight increase in left frontal subarachnoid blood. 12/19: CT head: Compared to the 09/07/2011 noncontrast CT examination. There has been an increase in amount of subarachnoid blood in the left frontal region. Etiology of this is indeterminate.  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   Objective Temp:  [97.7 F (36.5 C)-102.4 F (39.1 C)] 98.2 F (36.8 C) (12/20 1200) Pulse Rate:  [41-128] 101  (12/20 1430) Resp:  [19-38] 19  (12/20 1535) BP: (112-168)/(52-98) 137/75 mmHg (12/20 1500) SpO2:  [93 %-100 %] 93 % (12/20 1535) Weight:  [64.3 kg (141 lb 12.1 oz)] 141 lb 12.1 oz (64.3 kg) (12/20 0420)    Intake/Output Summary (Last 24 hours) at 09/10/11 1542 Last data filed at 09/10/11 1500  Gross per 24 hour  Intake 2158.5 ml  Output   2030 ml  Net  128.5 ml   Physical exam Temp:  [97.7 F (36.5 C)-102.4 F (39.1 C)] 98.2 F (36.8 C) (12/20 1200) Pulse Rate:  [41-128] 101  (12/20 1430) Resp:  [19-38] 19  (12/20 1535) BP: (112-168)/(52-98) 137/75 mmHg (12/20 1500) SpO2:  [93 %-100 %] 93 % (12/20 1535) Weight:  [64.3 kg (141 lb 12.1 oz)] 141 lb 12.1 oz (64.3 kg) (12/20 0420)  General: Calm, appropriate, appears to be in pain complaining of head and neck pain HEENT: mucous membrane moist, no JVD.  NECK: mild nuchal rigidity, no JVD  Pulm: clear anteriorly, no accessory muscle use Card: irregular  Irregular, rate controlled, mech S2 Abd: non-tender, no organomegally + bowel sounds GU: foley cath to straight drain EXT: no edema  radiology  pcxr: no sig change. Still has bibasilar volume loss.   LAB RESULT  Lab 09/10/11 0430 09/09/11 9563 09/08/11 0309  NA 135 138 135  K 2.9* 3.8 3.0*  CL 100 106 106  CO2 26 24 21   BUN 11 15 11   CREATININE 0.61 0.73 0.60  GLUCOSE 119* 109* 111*    Lab 09/10/11 0430 09/09/11 0517 09/08/11 0309  HGB 10.6* 11.1* 12.1  HCT  30.0* 31.7* 34.3*  WBC 20.8* 18.7* 21.1*  PLT 162 132* 120*    Assessment and Plan  SIRS/ Sepsis, in setting of Pneumococcal bacteremia/PVE . Lab 09/10/11 0430 09/09/11 0517 09/08/11 0309  WBC 20.8* 18.7* 21.1*  Plan: -cont current antibiotics as directed by ID -Dr Royann Shivers has contacted [facility] for further eval  Encephalopathy acute: in setting of sepsis, small SAH.  Neuro following. Concern for LE myelopathy > Neuro has ordered CT thoracic and L spine to R/O epidural abscess Plan: -Cont to monitor in Neuro ICU  -PCA fentanyl ordered for severe pain  Atrial fibrillation with rapid ventricular response.  Plan: -Cont current Rx -No anticoagulation for now  Hypokalemia  Lab 09/10/11 0430 09/09/11 0517 09/08/11 0309  K 2.9* 3.8 3.0*  plan: -repleted. Monitor  Although she is HDly stable and in no respiratory distress, she has a very complex constellation of problems and continues to warrant ICU level of care   Billy Fischer 09/10/2011, 3:42 PM

## 2011-09-10 NOTE — Progress Notes (Signed)
Subjective:  Awake and alert  Objective:  Vital Signs in the last 24 hours: Temp:  [97.7 F (36.5 C)-102.4 F (39.1 C)] 98.1 F (36.7 C) (12/20 0800) Pulse Rate:  [70-145] 82  (12/20 0800) Resp:  [19-30] 24  (12/20 0800) BP: (113-168)/(56-89) 116/57 mmHg (12/20 0800) SpO2:  [93 %-100 %] 98 % (12/20 0800) Weight:  [64.3 kg (141 lb 12.1 oz)] 141 lb 12.1 oz (64.3 kg) (12/20 0420)  Intake/Output from previous day:  Intake/Output Summary (Last 24 hours) at 09/10/11 0818 Last data filed at 09/10/11 0800  Gross per 24 hour  Intake   2711 ml  Output   2285 ml  Net    426 ml    Physical Exam: General appearance: alert, cooperative and no distress Lungs: clear, decreased breath sounds at bases Heart: irregularly irregular rhythm and 2/6 syst murmur, positive valve sounds   Rate: 90  Rhythm: atrial fibrillation  Lab Results:  Basename 09/10/11 0430 09/09/11 0517  WBC 20.8* 18.7*  HGB 10.6* 11.1*  PLT 162 132*    Basename 09/10/11 0430 09/09/11 0517  NA 135 138  K 2.9* 3.8  CL 100 106  CO2 26 24  GLUCOSE 119* 109*  BUN 11 15  CREATININE 0.61 0.73   No results found for this basename: TROPONINI:2,CK,MB:2 in the last 72 hours Hepatic Function Panel No results found for this basename: PROT,ALBUMIN,AST,ALT,ALKPHOS,BILITOT,BILIDIR,IBILI in the last 72 hours No results found for this basename: CHOL in the last 72 hours  Basename 09/10/11 0430  INR 1.40    Imaging: Ct Head Wo Contrast  09/10/2011  *RADIOLOGY REPORT*  Clinical Data: Follow up subarachnoid hemorrhage  CT HEAD WITHOUT CONTRAST  Technique:  Contiguous axial images were obtained from the base of the skull through the vertex without contrast.  Comparison: 09/09/2011  Findings: Hyperdense subarachnoid hemorrhage over the convexity left frontal region has improved.  No new areas of hemorrhage are identified.  No subdural hemorrhage is present.  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  No acute  infarct.  Negative for mass lesion.  IMPRESSION: Small areas of subarachnoid hemorrhage over the convexity on the left have improved.  No new area of hemorrhage or infarction.  Original Report Authenticated By: Camelia Phenes, M.D.   Ct Head Wo Contrast  09/09/2011  *RADIOLOGY REPORT*  Clinical Data: Followup subarachnoid hemorrhage.  CT HEAD WITHOUT CONTRAST  Technique:  Contiguous axial images were obtained from the base of the skull through the vertex without contrast.  Comparison: 09/07/2011 CT and 11/10/2005 MR.  Findings: Compared to the 09/07/2011 noncontrast CT examination. There has been an increase in amount of subarachnoid blood in the left frontal region.  Etiology of this is indeterminate.  Result of venous thrombosis / infarct not excluded.  Nonspecific white matter type changes most notable left frontal region.  Remote left caudate head infarct with encephalomalacia. This is new compared to 2007 MR. No CT evidence of large acute infarct.  Small acute infarct cannot be excluded by CT.  No intracranial mass lesion detected on this unenhanced exam.  IMPRESSION: Compared to the 09/07/2011 noncontrast CT examination.  There has been an increase in amount of subarachnoid blood in the left frontal region.  Etiology of this is indeterminate. Please see above.  Original Report Authenticated By: Fuller Canada, M.D.   Dg Chest Port 1 View  09/09/2011  *RADIOLOGY REPORT*  Clinical Data: No edema, evaluate endotracheal tube position  PORTABLE CHEST - 1 VIEW  Comparison: Portable  chest x-ray of 09/08/2011  Findings: No endotracheal tube is visible.  There is little change in poor aeration and basilar volume loss.  Cardiomegaly is stable. 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  IMPRESSION: No endotracheal tube.  No change in poor aeration with basilar volume loss.  Original Report Authenticated By: Juline Patch, M.D.    Cardiac Studies:  Assessment/Plan:   Principal Problem:   *Encephalopathy acute  Active Problems:  Pneumococcal septicemia, r/o endocarditis, (still febrile with T of 101 early this am)   Subarachnoid hemorrhage on CT this adm, enlarging by 12/19 CT   Atrial fibrillation with rapid ventricular response, Lanoxin added   S/P mitral valve replacement, St Jude   Chronic anticoagulation, ( INR goal 2.5-3.5 under normal circumstances for ST Jude MVR), reversed with FFP, INR 1.4 today   Presence of permanent cardiac pacemaker  Plan- pt for TEE today.    Corine Shelter PA-C 09/10/2011, 8:18 AM  I have seen and examined the patient along with Corine Shelter, PA C.  I have reviewed the chart, notes and new data.  I agree with PA's note.  Key new complaints: severe HA, but no focal neuro deficits Key examination changes: tense neck, flexed to right; sharp valve clicks; Afib Key new findings / data: INR down to 1.4 (was >2 until today)  PLAN: TEE today at bedside. Risks and benefits reviewed. Husband provided informed consent. Pneumococcus would be an unusual organism for prosthetic valve endocarditis. Difficult situation as far as anticoagulation decisions. Mechanical MV prosthesis and Atrial fibrillation is a very high risk situation for thromboembolic complications. Would be very concerned if anticoagulation needs to be interrupted for >6-7 days.  Thurmon Fair, MD, [facility] [phone] 09/10/2011, 12:40 PM

## 2011-09-10 NOTE — Procedures (Addendum)
TRANSESOPHAGEAL ECHOCARDIOGRAM   INDICATIONS: infective endocarditis and mechanical mitral valve prosthesis  PROCEDURE:   Informed consent was obtained prior to the procedure. The risks, benefits and alternatives for the procedure were discussed and the patient comprehended these risks.  Risks include, but are not limited to, cough, sore throat, vomiting, nausea, somnolence, esophageal and stomach trauma or perforation, bleeding, low blood pressure, aspiration, pneumonia, infection, trauma to the teeth and death.    After a procedural time-out, the patient was given 2 mg versed and 50 mcg fentanyl IV for moderate sedation.  The oropharynx was anesthetized with tetracaine spray.  The transesophageal probe was inserted in the esophagus and stomach without difficulty and multiple views were obtained.  The patient was kept under observation until the patient left the procedure room.  The patient left the procedure room in stable condition.   Agitated microbubble saline contrast was not administered.  COMPLICATIONS:    There were no immediate complications.  FINDINGS:  There is a mobile mass attached to the atrial aspect of the mitral prosthesis sewing ring. It does not interfere with disc motion and causes no obstruction. There is no evidence of periannular abscess or perivalvular leak. There is "physiological" prosthetic valve back wash.  There are no abnormalities of the aortic valve. The tricuspid valve is poorly seen due to acoustic shadowing. There is no evidence of left atrial appendage thrombus. No vegetations are seen attached to the visualized segments of the pacemaker leads. Left ventricular systolic function is normal.  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 The mobile mass that was seen could be a thrombus, but the patient was therapeutically anticoagulated until today.  Treat with intravenous antibiotics. Repeat TEE in 2 weeks. Consider  evaluation for cerebral mycotic aneurysm as source of subarachnoid bleeding (but this was not seen on 09/08/2011 CT angio of the head). There is a possibility she may need redo prosthetic valve replacement versus lifelong antibiotic suppression therapy. If valve replacement is performed, a bioprosthesis may be preferable.  Time Spent Directly with the Patient:  60 minutes   Carrie [name] 09/10/2011, 1:26 PM

## 2011-09-10 NOTE — Progress Notes (Signed)
PHARMACY - CRITICAL CARE PROGRESS NOTE  Pharmacy Consult for Antibiotic/Anticoagulation Monitoring Indication: Strep pneumo bacteremia, SAH, Coumadin PTA (St Jude MVR)  Pharmacy System-Based Medication Review: Admit Complaint: SAH related to warfarin coagulopathy  Anticoagulation: coumadin PTA for MVR/afib, now on hold for at least 7 days s/p SAH, INR today 1.4 after FFP Infectious Diseases: Vanco narrowed to ceftriaxone for strep pneumo bacteremia, no renal adjustment needed, BCx (12/17) ngtd x2, Tm 102.4, WBC up to 20.8-->18.7 Neurology: s/p SAH, persistent leg weakness and 10/10 headache, CT to r/o epidural hematoma/abscess, starting topamax for headache Cardiovascular: St Jude MVR, checking TEE today to r/o emboli/endocarditis, hemodynamics stable on digoxin & lopressor Gastrointestinal: Famotidine Fluids/Electrolytes/Nutrition: Hypokalemia (K+ 2.9), other lytes WNL Nephrology: CrCl~25mL/min Best Practices: anticoagulation on hold, home meds addressed  Plan:  No antibiotic renal adjustments needed. Follow-up anticoagulation restart plans.  Ival Bible Pager: [phone] 09/10/2011,9:30 AM   Allergies  Allergen Reactions  . Codeine Nausea And Vomiting    Patient Measurements: Height: 5\' 1"  (154.9 cm) Weight: 141 lb 12.1 oz (64.3 kg) IBW/kg (Calculated) : 47.8   Vital Signs: Temp: 98.1 F (36.7 C) (12/20 0800) Temp src: Oral (12/20 0800) BP: 116/57 mmHg (12/20 0800) Pulse Rate: 82  (12/20 0800) Intake/Output from previous day: 12/19 0701 - 12/20 0700 In: 2711 [P.O.:800; I.V.:1162.5; Blood:648.5; IV Piggyback:100] Out: 2560 [Urine:2560] Intake/Output from this shift: Total I/O In: 50 [I.V.:50] Out: -  Vent settings for last 24 hours:    Labs:  Basename 09/10/11 0430 09/09/11 0517 09/08/11 1402 09/08/11 0309  WBC 20.8* 18.7* -- 21.1*  HGB 10.6* 11.1* -- 12.1  HCT 30.0* 31.7* -- 34.3*  PLT 162 132* -- 120*  APTT -- -- -- --  INR 1.40 2.03* 2.79* --    CREATININE 0.61 0.73 -- 0.60  LABCREA -- -- -- --  CREATININE 0.61 0.73 -- 0.60  LABCREA -- -- -- --  CREAT24HRUR -- -- -- --  MG -- -- -- --  PHOS -- -- -- --  ALBUMIN -- -- -- --  PROT -- -- -- --  AST -- -- -- --  ALT -- -- -- --  ALKPHOS -- -- -- --  BILITOT -- -- -- --  BILIDIR -- -- -- --  IBILI -- -- -- --   Estimated Creatinine Clearance: 57 ml/min (by C-G formula based on Cr of 0.61).  No results found for this basename: GLUCAP:3 in the last 72 hours  Microbiology: Recent Results (from the past 720 hour(s))  URINE CULTURE     Status: Normal   Collection Time   09/06/11 12:03 PM      Component Value Range Status Comment   Specimen Description URINE, CATHETERIZED   Final    Special Requests NONE   Final    Setup Time [id number]   Final    Colony Count NO GROWTH   Final    Culture NO GROWTH   Final    Report Status 09/08/2011 FINAL   Final   CULTURE, BLOOD (ROUTINE X 2)     Status: Normal   Collection Time   09/06/11  1:30 PM      Component Value Range Status Comment   Specimen Description BLOOD LEFT URINE   Final    Special Requests NONE [facility]   Final    Setup Time [id number]   Final    Culture     Final    Value: STREPTOCOCCUS PNEUMONIAE     Note: SUSCEPTIBILITIES PERFORMED ON PREVIOUS CULTURE  WITHIN THE LAST 5 DAYS.     Note: Gram Stain Report Called to,Read Back By and Verified With: Eden Emms RN on 09/07/11 at 05:10 by Christie Nottingham   Report Status 09/09/2011 FINAL   Final   CULTURE, BLOOD (ROUTINE X 2)     Status: Normal   Collection Time   09/06/11  1:40 PM      Component Value Range Status Comment   Specimen Description BLOOD RIGHT HAND   Final    Special Requests NONE [facility]   Final    Setup Time [id number]   Final    Culture     Final    Value: STREPTOCOCCUS PNEUMONIAE     Note: Gram Stain Report Called to,Read Back By and Verified With: Eden Emms RN on 09/06/16 at 05:10 by Christie Nottingham   Report Status 09/09/2011 FINAL   Final     Organism ID, Bacteria STREPTOCOCCUS PNEUMONIAE   Final   CULTURE, BLOOD (ROUTINE X 2)     Status: Normal (Preliminary result)   Collection Time   09/06/11  4:00 PM      Component Value Range Status Comment   Specimen Description BLOOD RIGHT ARM   Final    Special Requests     Final    Value: Normal BOTTLES DRAWN AEROBIC AND ANAEROBIC 5CC EACH   Setup Time [phone]   Final    Culture     Final    Value:        BLOOD CULTURE RECEIVED NO GROWTH TO DATE CULTURE WILL BE HELD FOR 5 DAYS BEFORE ISSUING A FINAL NEGATIVE REPORT   Report Status PENDING   Incomplete   CULTURE, BLOOD (ROUTINE X 2)     Status: Normal (Preliminary result)   Collection Time   09/06/11  4:10 PM      Component Value Range Status Comment   Specimen Description BLOOD STAT BOTTLES DRAWN AEROBIC ONLY RIGHT HAND   Final    Special Requests Normal 5CC    Final    Setup Time [id number]   Final    Culture     Final    Value:        BLOOD CULTURE RECEIVED NO GROWTH TO DATE CULTURE WILL BE HELD FOR 5 DAYS BEFORE ISSUING A FINAL NEGATIVE REPORT   Report Status PENDING   Incomplete   MRSA PCR SCREENING     Status: Normal   Collection Time   09/06/11  6:46 PM      Component Value Range Status Comment   MRSA by PCR NEGATIVE  NEGATIVE  Final   INFLUENZA VIRUS AG, A+B (DFA)     Status: Normal   Collection Time   09/06/11  9:24 PM      Component Value Range Status Comment   Influenza Virus A and B Ag REPORT   Final   RESPIRATORY VIRUS PANEL (18 COMPONENTS)     Status: Normal   Collection Time   09/06/11  9:44 PM      Component Value Range Status Comment   Source - RVPAN SPUTUM   Final    Respiratory Syncytial Virus A NOT DETECTED   Final    Respiratory Syncytial Virus B NOT DETECTED   Final    Influenza A NOT DETECTED   Final    Influenza B NOT DETECTED   Final    Parainfluenza 1 NOT DETECTED   Final    Parainfluenza 2 NOT DETECTED   Final    Parainfluenza  3 NOT DETECTED   Final    Parainfluenza 4 NOT DETECTED    Final    Metapneumovirus NOT DETECTED   Final    [place] and Echovirus NOT DETECTED   Final    Rhinovirus NOT DETECTED   Final    Adenovirus B NOT DETECTED   Final    Adenovirus E NOT DETECTED   Final    CoronavirusNL63 NOT DETECTED   Final    CoronavirusHKU1 NOT DETECTED   Final    Coronavirus229E NOT DETECTED   Final    CoronavirusOC43 NOT DETECTED   Final    Bocavirus NOT DETECTED   Final   CULTURE, BLOOD (ROUTINE X 2)     Status: Normal (Preliminary result)   Collection Time   09/07/11  4:00 PM      Component Value Range Status Comment   Specimen Description BLOOD LEFT HAND   Final    Special Requests BOTTLES DRAWN AEROBIC AND ANAEROBIC 5 CC EACH   Final    Setup Time [id number]   Final    Culture     Final    Value:        BLOOD CULTURE RECEIVED NO GROWTH TO DATE CULTURE WILL BE HELD FOR 5 DAYS BEFORE ISSUING A FINAL NEGATIVE REPORT   Report Status PENDING   Incomplete   CULTURE, BLOOD (ROUTINE X 2)     Status: Normal (Preliminary result)   Collection Time   09/07/11  4:05 PM      Component Value Range Status Comment   Specimen Description BLOOD RIGHT HAND   Final    Special Requests BOTTLES DRAWN AEROBIC AND ANAEROBIC 5 CC EACH   Final    Setup Time [id number]   Final    Culture     Final    Value:        BLOOD CULTURE RECEIVED NO GROWTH TO DATE CULTURE WILL BE HELD FOR 5 DAYS BEFORE ISSUING A FINAL NEGATIVE REPORT   Report Status PENDING   Incomplete   MRSA PCR SCREENING     Status: Normal   Collection Time   09/09/11  5:56 PM      Component Value Range Status Comment   MRSA by PCR NEGATIVE  NEGATIVE  Final     Medications:  Prescriptions prior to admission  Medication Sig Dispense Refill  . digoxin (LANOXIN) 0.25 MG tablet Take 250 mcg by mouth daily.        Marland Kitchen diltiazem (CARDIZEM LA) 300 MG 24 hr tablet Take 360 mg by mouth daily.        . famotidine (PEPCID) 20 MG tablet Take 20 mg by mouth 2 (two) times daily.        Marland Kitchen losartan (COZAAR) 50 MG tablet Take 50  mg by mouth daily.        . metoprolol tartrate (LOPRESSOR) 25 MG tablet Take 25 mg by mouth 2 (two) times daily.        Marland Kitchen warfarin (COUMADIN) 5 MG tablet Take 5 mg by mouth daily.         Scheduled:    . antiseptic oral rinse  15 mL Mouth Rinse q12n4p  . cefTRIAXone (ROCEPHIN)  IV  2 g Intravenous Q12H  . chlorhexidine  15 mL Mouth Rinse BID  . digoxin  0.125 mg Oral Daily  . digoxin  0.25 mg Oral Q8H  . famotidine  20 mg Oral BID  . metoprolol tartrate  25 mg Oral BID  . phytonadione  10 mg Subcutaneous Once  . potassium chloride  40 mEq Oral Q1 Hr x 2  . rosuvastatin  40 mg Oral q1800  . topiramate  25 mg Oral BID  . zolpidem  5 mg Oral Once  . DISCONTD: simvastatin  80 mg Oral q1800  . DISCONTD: vancomycin  500 mg Intravenous Q12H   Infusions:    . sodium chloride 50 mL/hr at 09/10/11 0800

## 2011-09-10 NOTE — Consult Note (Signed)
[address]            [place] [address]          [id number]       Carrie [name] [facility] Medical Record #[id number] Date of Birth: [birth year]  Referring: No ref. provider found Primary Care: Geraldo Pitter, MD  Chief Complaint:    Chief Complaint  Patient presents with  . Fever  . Cough  . Nausea  . Emesis    History of Present Illness:     70 yo admitted with flu like symptoms and altered mental status. 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 Since in remission her anticoagulation has been stopped. Overall she is much improved according to her daughter. She is now awake alert and reasonably able to relate her past history.     Current Activity/ Functional Status: Patient was independent with mobility/ambulation, transfers, ADL's, IADL's.   Past Medical History  Diagnosis Date  . Hypertension   . Atrial fibrillation     normal coroonaries cath 2004. Dr Clarene [facility]   . Sick sinus syndrome     Dr Sharrell Ku. EP study negative for inducibel arrythmia. ? pacer since 2007  . Hyperlipidemia   . Arthritis   . Anxiety   . Diverticula of colon   . Pneumonia 2009    resolved.? OPD Rx    Past Surgical History  Procedure Date  . Pacemaker insertion   . Mitral valve replacement 1998    St Jude  . Appendectomy   . Tubal ligation     History  Smoking status  . Never Smoker   Smokeless tobacco  . Not on file    History  Alcohol Use No    History   Social History  . Marital Status: Married    Spouse Name: N/A    Number of Children: N/A  . Years of Education: N/A   Occupational History  . Not on file.   Social History Main Topics  . Smoking status: Never Smoker   . Smokeless tobacco: Not on file  . Alcohol Use: No  . Drug Use: No      Allergies  Allergen Reactions  . Codeine Nausea And Vomiting    Current Facility-Administered  Medications  Medication Dose Route Frequency Provider Last Rate Last Dose  . 0.9 %  sodium chloride infusion   Intravenous Continuous Billy Fischer, MD 20 mL/hr at 09/10/11 1800    . 0.9 %  sodium chloride infusion  250 mL Intravenous PRN Kalman Shan, MD      . acetaminophen (TYLENOL) tablet 650 mg  650 mg Oral Q6H PRN Cliffton Asters, MD   650 mg at 09/10/11 1456  . cefTRIAXone (ROCEPHIN) 2 g in dextrose 5 % 50 mL IVPB  2 g Intravenous Q12H Cliffton Asters, MD   2 g at 09/10/11 1545  . digoxin (LANOXIN) tablet 0.125 mg  0.125 mg Oral Daily Eda Paschal [place], [place]      . digoxin (LANOXIN) tablet 0.25 mg  0.25 mg Oral [address] [place], [place]   0.25 mg at 09/10/11 1038  . diphenhydrAMINE (BENADRYL) injection 12.5 mg  12.5 mg Intravenous Q6H PRN Billy Fischer, MD       Or  . diphenhydrAMINE (BENADRYL) 12.5 MG/5ML elixir 12.5 mg  12.5 mg Oral Q6H PRN Billy Fischer, MD      . famotidine (PEPCID) tablet 20 mg  20 mg Oral BID Kalman Shan, MD   20 mg at 09/10/11 1038  . fentaNYL (SUBLIMAZE) injection 50 mcg  50 mcg Intravenous Once Mihai Croitoru      . fentaNYL 10 mcg/mL PCA injection   Intravenous Q4H Billy Fischer, MD   30 mcg at 09/10/11 1726  . metoprolol tartrate (LOPRESSOR) tablet 25 mg  25 mg Oral BID Billy Fischer, MD   25 mg at 09/10/11 1038  . midazolam (VERSED) injection 2 mg  2 mg Intravenous Once Mihai Croitoru   2 mg at 09/10/11 1304  . naloxone [facility]) injection 0.4 mg  0.4 mg Intravenous PRN Billy Fischer, MD       And  . sodium chloride 0.9 % injection 9 mL  9 mL Intravenous PRN Billy Fischer, MD      . ondansetron [facility]) injection 4 mg  4 mg Intravenous Q6H PRN Billy Fischer, MD      . potassium chloride SA (K-DUR,KLOR-CON) CR tablet 40 mEq  40 mEq Oral Q1 Hr x 2 Elizabeth Deterding   40 mEq at 09/10/11 0806  . rosuvastatin (CRESTOR) tablet 40 mg  40 mg Oral q1800 Kalman Shan, MD   40 mg at 09/10/11 1750  . topiramate (TOPAMAX) tablet 25 mg  25 mg Oral BID Annie Main, NP   25  mg at 09/10/11 1038  . zolpidem (AMBIEN) tablet 5 mg  5 mg Oral Once Lanora Manis Deterding   5 mg at 09/10/11 0147  . DISCONTD: antiseptic oral rinse (BIOTENE) solution 15 mL  15 mL Mouth Rinse q12n4p Sosan Abdullah   15 mL at 09/09/11 1200  . DISCONTD: chlorhexidine (PERIDEX) 0.12 % solution 15 mL  15 mL Mouth Rinse BID Sosan Abdullah   15 mL at 09/10/11 0806  . DISCONTD: fentaNYL (SUBLIMAZE) injection 50 mcg  50 mcg Intravenous Q1H PRN [name], MD   50 mcg at 09/10/11 1240  . DISCONTD: metoprolol tartrate (LOPRESSOR) 25 mg/10 mL oral suspension 25 mg  25 mg Oral BID Billy Fischer, MD   25 mg at 09/09/11 2211    Prescriptions prior to admission  Medication Sig Dispense Refill  . digoxin (LANOXIN) 0.25 MG tablet Take 250 mcg by mouth daily.        Marland Kitchen diltiazem (CARDIZEM LA) 300 MG 24 hr tablet Take 360 mg by mouth daily.        . famotidine (PEPCID) 20 MG tablet Take 20 mg by mouth 2 (two) times daily.        Marland Kitchen losartan (COZAAR) 50 MG tablet Take 50 mg by mouth daily.        . metoprolol tartrate (LOPRESSOR) 25 MG tablet Take 25 mg by mouth 2 (two) times daily.        Marland Kitchen warfarin (COUMADIN) 5 MG tablet Take 5 mg by mouth daily.          No family history on file.   Review of Systems:     Cardiac Review of Systems: Y or N  Chest Pain [   n ]  Resting SOB Cove.Etienne  ] Exertional SOB  [  y]  Orthopnea [ y ]   Pedal Edema [  h ]    Palpitations Kavan.Pulley  ] Syncope  [ n ]   Presyncope Cove.Etienne   ]  General Review of Systems: [Y] = yes [  ]=no Constitional: recent  weight change [ h ]; anorexia [  ]; fatigue [ h ]; nausea [ h ]; night sweats [  ]; fever [ y ]; or chills Cove.Etienne  ];                                                                                                                                          Dental: poor dentition[  ];  Eye : blurred vision [  ]; diplopia [   ]; vision changes [  ];  Amaurosis fugax[  ]; Resp: cough [  ];  wheezing[  ];  hemoptysis[  ]; shortness of breath[  ]; paroxysmal  nocturnal dyspnea[  ]; dyspnea on exertion[  ]; or orthopnea[  ];  GI:  gallstones[  ], vomiting[  ];  dysphagia[  ]; melena[  ];  hematochezia [  ]; heartburn[  ];   Hx of  Colonoscopy[  ]; GU: kidney stones [  ]; hematuria[  ];   dysuria [  ];  nocturia[  ];  history of     obstruction [  ];             Skin: rash, swelling[  ];, hair loss[  ];  peripheral edema[  ];  or itching[  ]; Musculosketetal: myalgias[  ];  joint swelling[  ];  joint erythema[  ];  joint pain[  ];  back pain[  ];  Heme/Lymph: bruising[  ];  bleeding[  ];  anemia[  ];  Neuro: TIA[  ];  headaches[  ];  stroke[  ];  vertigo[  ];  seizures[  ];   paresthesias[  ];  difficulty walking[  ];  Psych:depression[  ]; anxiety[  ];  Endocrine: diabetes[  ];  thyroid dysfunction[  ];  Immunizations: Flu [  ]; Pneumococcal[  ];  Other:  Physical Exam: BP 121/61  Pulse 102  Temp(Src) 100.6 F (38.1 C) (Oral)  Resp 22  Ht 5\' 1"  (1.549 m)  Wt 141 lb 12.1 oz (64.3 kg)  BMI 26.78 kg/m2  SpO2 95%  General appearance: alert, cooperative, appears older than stated age and no distress Neurologic: intact Heart: irregularly irregular rhythm and Click of mechanical mitral valve is appreciated Lungs: clear to auscultation bilaterally Abdomen: soft, non-tender; bowel sounds normal; no masses,  no organomegaly Extremities: extremities normal, atraumatic, no cyanosis or edema, no edema, redness or tenderness in the calves or thighs and no ulcers, gangrene or trophic changes  no peripheral signs of embolic phenomena or signs of endocarditis  Diagnostic Studies & Laboratory data:     Recent Radiology Findings:   Ct Head Wo Contrast  09/10/2011  *RADIOLOGY REPORT*  Clinical Data: Follow up subarachnoid hemorrhage  CT HEAD WITHOUT CONTRAST  Technique:  Contiguous axial images were obtained from the base of the skull through the vertex without contrast.  Comparison: 09/09/2011  Findings: Hyperdense subarachnoid hemorrhage over  the  convexity left frontal region has improved.  No new areas of hemorrhage are identified.  No subdural hemorrhage is present.  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  No acute infarct.  Negative for mass lesion.  IMPRESSION: Small areas of subarachnoid hemorrhage over the convexity on the left have improved.  No new area of hemorrhage or infarction.  Original Report Authenticated By: Camelia Phenes, M.D.   Ct Head Wo Contrast  09/09/2011  *RADIOLOGY REPORT*  Clinical Data: Followup subarachnoid hemorrhage.  CT HEAD WITHOUT CONTRAST  Technique:  Contiguous axial images were obtained from the base of the skull through the vertex without contrast.  Comparison: 09/07/2011 CT and 11/10/2005 MR.  Findings: Compared to the 09/07/2011 noncontrast CT examination. There has been an increase in amount of subarachnoid blood in the left frontal region.  Etiology of this is indeterminate.  Result of venous thrombosis / infarct not excluded.  Nonspecific white matter type changes most notable left frontal region.  Remote left caudate head infarct with encephalomalacia. This is new compared to 2007 MR. No CT evidence of large acute infarct.  Small acute infarct cannot be excluded by CT.  No intracranial mass lesion detected on this unenhanced exam.  IMPRESSION: Compared to the 09/07/2011 noncontrast CT examination.  There has been an increase in amount of subarachnoid blood in the left frontal region.  Etiology of this is indeterminate. Please see above.  Original Report Authenticated By: Fuller Canada, M.D.   Dg Chest Port 1 View  09/09/2011  *RADIOLOGY REPORT*  Clinical Data: No edema, evaluate endotracheal tube position  PORTABLE CHEST - 1 VIEW  Comparison: Portable chest x-ray of 09/08/2011  Findings: No endotracheal tube is visible.  There is little change in poor aeration and basilar volume loss.  Cardiomegaly is stable. 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  IMPRESSION: No endotracheal  tube.  No change in poor aeration with basilar volume loss.  Original Report Authenticated By: Juline Patch, M.D.      Recent Lab Findings: Lab Results  Component Value Date   WBC 20.8* 09/10/2011   HGB 10.6* 09/10/2011   HCT 30.0* 09/10/2011   PLT 162 09/10/2011   GLUCOSE 119* 09/10/2011   ALT 16 09/06/2011   AST 46* 09/06/2011   NA 135 09/10/2011   K 2.9* 09/10/2011   CL 100 09/10/2011   CREATININE 0.61 09/10/2011   BUN 11 09/10/2011   CO2 26 09/10/2011   TSH 0.453 09/06/2011   INR 1.40 09/10/2011  TEE reviewed  TEE performed.  There is a mobile mass attached to the atrial aspect of the mitral prosthesis sewing ring. It does not interfere with disc motion and causes no obstruction. There is no evidence of periannular abscess or perivalvular leak.  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 Pneumococcus is a very unusual organism for this. The mobile mass that was seen could be a thrombus, but the patient was therapeutically anticoagulated until today. What is more, the clinical picture (protracted flu like illness and fever) is consistent with endocarditis.  Recommend:  - CVTS evaluation (I have called) - she is obviously not ready for surgery yet, but redo MVR may prove necessary once blood stream is sterilized and acute SAH resolves  - There is a possibility she may need redo prosthetic valve replacement versus lifelong antibiotic suppression therapy.  - Treat with intravenous antibiotics for at least 4-6 weeks  - Repeat TEE in 2 weeks.  - Consider  evaluation for cerebral mycotic aneurysm as source of subarachnoid bleeding (but this was not seen on 09/08/2011 CT angio of the head)  - High risk for thromboembolic complications. Start anticoagulation as soon as safe neurologically (that will be a difficult judgement call).  Complicated situation with very guarded prognosis. Discussed w Dr. Pearlean Brownie.  Thurmon Fair, MD, [facility]  [facility]   [phone]  1:48 PM     Assessment / Plan:      The patient seems to have responded to the current therapy compared to the extraction of her admission she seems much improved. She has a small mobile mass attached to the sewing ring of the mechanical valve prosthesis is not interfering with leaflet motion, there is no significant mitral insufficiency the valve sewing ring appears well seated without any evidence of perivalvular leak The assumption is the patient has endocarditis and will need to be treated with long-term antibiotics The critical situation now is the need for anticoagulation for the mechanical valve versus the avoidance of anticoagulation because of the evidence of subarachnoid bleed. The longer she is off anticoagulation from the point of view of the valve the greater the risk of embolic phenomenon and/or thrombosis of the leaflets. At this point I would not recommend any surgical intervention on the mechanical valve, agree with cardiology that a repeat TEE in several weeks would be indicated.      Delight Ovens MD  Beeper [phone] Office [phone] 09/10/2011 6:30 PM

## 2011-09-10 NOTE — Progress Notes (Signed)
Lab results called to eLink were as follows: WBC 20.8, K+2.9, pt/inr 17.4/1.4

## 2011-09-10 NOTE — Progress Notes (Signed)
TEE performed.  There is a mobile mass attached to the atrial aspect of the mitral prosthesis sewing ring. It does not interfere with disc motion and causes no obstruction. There is no evidence of periannular abscess or perivalvular leak.    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 Pneumococcus is a very unusual organism for this. The mobile mass that was seen could be a thrombus, but the patient was therapeutically anticoagulated until today. What is more, the clinical picture (protracted flu like illness and fever) is consistent with endocarditis.  Recommend: - CVTS evaluation (I have called) - she is obviously not ready for surgery yet, but redo MVR may prove necessary once blood stream is sterilized and acute SAH resolves - There is a possibility she may need redo prosthetic valve replacement versus lifelong antibiotic suppression therapy. - Treat with intravenous antibiotics for at least 4-6 weeks - Repeat TEE in 2 weeks. - Consider evaluation for cerebral mycotic aneurysm as source of subarachnoid bleeding (but this was not seen on 09/08/2011 CT angio of the head) - High risk for thromboembolic complications. Start anticoagulation as soon as safe neurologically (that will be a difficult judgement call).  Complicated situation with very guarded prognosis. Discussed w Dr. Pearlean Brownie.  Thurmon Fair, MD, [facility] [phone] 1:48 PM

## 2011-09-11 ENCOUNTER — Inpatient Hospital Stay (HOSPITAL_COMMUNITY): Payer: Medicare Other

## 2011-09-11 DIAGNOSIS — T827XXA Infection and inflammatory reaction due to other cardiac and vascular devices, implants and grafts, initial encounter: Secondary | ICD-10-CM

## 2011-09-11 DIAGNOSIS — I38 Endocarditis, valve unspecified: Secondary | ICD-10-CM | POA: Diagnosis present

## 2011-09-11 DIAGNOSIS — T826XXA Infection and inflammatory reaction due to cardiac valve prosthesis, initial encounter: Secondary | ICD-10-CM | POA: Diagnosis present

## 2011-09-11 LAB — CBC
HCT: 30.4 % — ABNORMAL LOW (ref 36.0–46.0)
Hemoglobin: 10.5 g/dL — ABNORMAL LOW (ref 12.0–15.0)
MCHC: 34.5 g/dL (ref 30.0–36.0)
MCV: 86.4 fL (ref 78.0–100.0)
RDW: 15.1 % (ref 11.5–15.5)

## 2011-09-11 LAB — BASIC METABOLIC PANEL
CO2: 19 mEq/L (ref 19–32)
Chloride: 101 mEq/L (ref 96–112)
Creatinine, Ser: 0.58 mg/dL (ref 0.50–1.10)
Glucose, Bld: 139 mg/dL — ABNORMAL HIGH (ref 70–99)

## 2011-09-11 LAB — PREPARE FRESH FROZEN PLASMA: Unit division: 0

## 2011-09-11 LAB — PROTIME-INR: INR: 1.23 (ref 0.00–1.49)

## 2011-09-11 MED ORDER — POTASSIUM CHLORIDE CRYS ER 20 MEQ PO TBCR
40.0000 meq | EXTENDED_RELEASE_TABLET | Freq: Every day | ORAL | Status: AC
  Administered 2011-09-11 – 2011-09-12 (×2): 40 meq via ORAL
  Filled 2011-09-11 (×2): qty 2

## 2011-09-11 NOTE — Progress Notes (Signed)
Subjective:  Looks a little weaker today  Objective:  Vital Signs in the last 24 hours: Temp:  [97.7 F (36.5 C)-102.3 F (39.1 C)] 97.7 F (36.5 C) (12/21 0800) Pulse Rate:  [41-126] 80  (12/21 0800) Resp:  [19-38] 29  (12/21 0800) BP: (95-158)/(52-98) 123/70 mmHg (12/21 0800) SpO2:  [89 %-100 %] 96 % (12/21 0800) Weight:  [63 kg (138 lb 14.2 oz)] 138 lb 14.2 oz (63 kg) (12/21 0500)  Intake/Output from previous day:  Intake/Output Summary (Last 24 hours) at 09/11/11 1012 Last data filed at 09/11/11 0810  Gross per 24 hour  Intake  995.5 ml  Output   2510 ml  Net -1514.5 ml    Physical Exam: General appearance: alert, cooperative and mild distress Lungs: decreased breath sounds, poor effort Heart: irregularly irregular rhythm and positive valve sounds   Rate: 80  Rhythm: atrial fibrillation  Lab Results:  Basename 09/11/11 0430 09/10/11 0430  WBC 22.1* 20.8*  HGB 10.5* 10.6*  PLT 224 162    Basename 09/11/11 0430 09/10/11 0430  NA 130* 135  K 3.3* 2.9*  CL 101 100  CO2 19 26  GLUCOSE 139* 119*  BUN 10 11  CREATININE 0.58 0.61   No results found for this basename: TROPONINI:2,CK,MB:2 in the last 72 hours Hepatic Function Panel No results found for this basename: PROT,ALBUMIN,AST,ALT,ALKPHOS,BILITOT,BILIDIR,IBILI in the last 72 hours No results found for this basename: CHOL in the last 72 hours  Basename 09/11/11 0430  INR 1.23      Cardiac Studies:  Assessment/Plan:   Principal Problem:  *Encephalopathy acute  Active Problems:  Endocarditis of prosthetic MV,(full two weeks of HIGH dose rocephin, followed by an additional 4 weeks of IV rocephin dosed at 2 grams per day per ID). Still spiking fevers, T 102 4am today, WBC up-22k   Pneumococcal septicemia   Subarachnoid hemorrhage on CT this adm   Atrial fibrillation with rapid ventricular response   S/P mitral valve replacement, St Jude   Chronic anticoagulation, ( INR goal 2.5-3.5 under  normal circumstances for ST Jude MVR)   Presence of permanent cardiac pacemaker   Plan-See Dr Clarisa Kindred note, hold anticoagulation for at least a week. No indication for surgical valve replacement per Dr Tyrone Sage. Continue Metoprolol and Lanoxin for AF   [employer] PA-C 09/11/2011, 10:12 AM

## 2011-09-11 NOTE — Progress Notes (Signed)
Patient ID: Carrie [name], female   DOB: 1940/11/05, 70 y.o.   MRN: [id number]                   [address]            [place] [address]          [id number]   BP 118/64  Pulse 79  Temp(Src) 99 F (37.2 C) (Oral)  Resp 26  Ht 5\' 1"  (1.549 m)  Wt 138 lb 14.2 oz (63 kg)  BMI 26.24 kg/m2  SpO2 97%  Fever early today now 99 Awake and alert No complaints   Not currently  on Anticoagulation. In the setting of a mechanical mitral valve and atrial fib this patient is at the greatest risk of embolic stoke or thrombosis of the valve then any other situations with mechanical valves. The high risk of thrombosis or embolic stroke or both this needs to be keep in mind when  deciding how long to avoid anticoagulation.    Delight Ovens MD  Beeper [phone] Office [phone]

## 2011-09-11 NOTE — Progress Notes (Signed)
Patient name: Carrie [name] Medical record number: [id number] Date of birth: September 24, 1940 Age: 70 y.o. Gender: female PCP: Geraldo Pitter, MD, Dr Clarene [facility]  Date: 09/11/2011 Reason for Consult: sepsis, flu syndrome, delirium  Referring Physician: DR Lynnette Caffey ER  PT PROFILE: 70 year old female who presented to [facility] ED 12/16 with 3 day history of high fever, fatigue, malaise, tachycardia, leukocytosis, elevated PCT and dx of severe sepsis. Has h/o prosthetic MV and chronic warfarin rx. Admission blood cx positive for pneumococcus. CT head 12/17 in eval of AMS revealed small SAH > anticoagulation reversed and transferred 12/19 to NICU to monitor neuro status more closely  Lines/tubes None  Culture data/sepsis markers Legionella antigen 12/17: NEG Urine strep 12/17: NEG Influenza A and B. nasopharyngeal swab antibody >>NEG MRSA PCR: NEG Multiplex respiratory virus panel 18 viruses PCR 09/06/11 >> NEG Blood culture 12/17: STREPTOCOCCUS PNEUMONIAE  Antibiotics Azithromycin 12/16>>>12/18 Tamiflu 12/16>>>12/18 Vancomycin 12/17 >> 12/19 Ceftriaxone 2 g 12/16 >>   Best practice Pepcid SCDs  Protocols/consults [facility] 12/17 Neuro 12/18 ID 12/18  Events/studies Echo 12/17: Normal EF, No vegetations seen 12/17: CT neck:Apparent linear filling defect within the right common carotid artery, possibly artifactual, although focal dissection cannot be excluded. Consider CTA/MRA neck for further characterization as clinically warranted.No evidence of fracture or dislocation.No epidural abscess/fluid collection is seen. 12/17: CT head:Suspect slight increase in left frontal subarachnoid blood. Icannot completely exclude that this increased prominence relates to a component of enhancement  12/18: CTA neck: No visible proximal great vessel dissection within limits of detection by artifact from pacemaker battery pack. No evidence for flow reducing lesion in the right or left carotid system. 12/18  CT head: Suspect slight increase in left frontal subarachnoid blood. 12/19: CT head: Compared to the 09/07/2011 noncontrast CT examination. There has been an increase in amount of subarachnoid blood in the left frontal region. Etiology of this is indeterminate.  12/20: TEE [facility]): Findings are consistent with prosthetic valve endocarditis  Subjective Weak, uncomfortable, c/o neck and back pain  Objective Temp:  [97.7 F (36.5 C)-102.3 F (39.1 C)] 97.7 F (36.5 C) (12/21 0800) Pulse Rate:  [41-126] 84  (12/21 1000) Resp:  [19-38] 23  (12/21 1000) BP: (95-158)/(52-98) 157/89 mmHg (12/21 1000) SpO2:  [89 %-100 %] 91 % (12/21 1000) Weight:  [63 kg (138 lb 14.2 oz)] 138 lb 14.2 oz (63 kg) (12/21 0500)    Intake/Output Summary (Last 24 hours) at 09/11/11 1035 Last data filed at 09/11/11 1000  Gross per 24 hour  Intake 1395.5 ml  Output   2510 ml  Net -1114.5 ml   Physical exam Temp:  [97.7 F (36.5 C)-102.3 F (39.1 C)] 97.7 F (36.5 C) (12/21 0800) Pulse Rate:  [41-126] 84  (12/21 1000) Resp:  [19-38] 23  (12/21 1000) BP: (95-158)/(52-98) 157/89 mmHg (12/21 1000) SpO2:  [89 %-100 %] 91 % (12/21 1000) Weight:  [63 kg (138 lb 14.2 oz)] 138 lb 14.2 oz (63 kg) (12/21 0500)  General: in pain complaining of head and neck pain HEENT: mucous membrane moist, no JVD.  NECK: mild nuchal rigidity, no JVD  Pulm: clear anteriorly, no accessory muscle use Card: irregular  Irregular, rate controlled, mech S2 Abd: non-tender, no organomegally + bowel sounds GU: foley cath to straight drain EXT: no edema  radiology Ct Head Wo Contrast  09/10/2011  *RADIOLOGY REPORT*  Clinical Data: Follow up subarachnoid hemorrhage  CT HEAD WITHOUT CONTRAST  Technique:  Contiguous axial images were obtained from the base  of the skull through the vertex without contrast.  Comparison: 09/09/2011  Findings: Hyperdense subarachnoid hemorrhage over the convexity left frontal region has improved.  No new areas  of hemorrhage are identified.  No subdural hemorrhage is present.  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  No acute infarct.  Negative for mass lesion.  IMPRESSION: Small areas of subarachnoid hemorrhage over the convexity on the left have improved.  No new area of hemorrhage or infarction.  Original Report Authenticated By: Camelia Phenes, M.D.   Dg Chest Port 1 View  09/11/2011  *RADIOLOGY REPORT*  Clinical Data: Respiratory failure  PORTABLE CHEST - 1 VIEW  Comparison: 09/09/2011  Findings: Heart is moderately enlarged.  Single lead left subclavian pacemaker device unchanged.  Low volumes and hazy airspace opacities improved.  Stable mid lung right-sided linear opacity.  Normal pulmonary vascularity.  IMPRESSION: Improved aeration and airspace opacities.  Original Report Authenticated By: Donavan Burnet, M.D.    LAB RESULT  Lab 09/11/11 0430 09/10/11 0430 09/09/11 0517  NA 130* 135 138  K 3.3* 2.9* 3.8  CL 101 100 106  CO2 19 26 24   BUN 10 11 15   CREATININE 0.58 0.61 0.73  GLUCOSE 139* 119* 109*    Lab 09/11/11 0430 09/10/11 0430 09/09/11 0517  HGB 10.5* 10.6* 11.1*  HCT 30.4* 30.0* 31.7*  WBC 22.1* 20.8* 18.7*  PLT 224 162 132*    Assessment and Plan  SIRS/ Sepsis, in setting of Pneumococcal bacteremia/PVE, hemodynamically improved  Lab 09/11/11 0430 09/10/11 0430 09/09/11 0517  WBC 22.1* 20.8* 18.7*  Plan: -cont current antibiotics as directed by ID -Appreciate cardiology and TCTS involvement; no indication for SGY at this time,  -will need repeat TEE in several weeks after abx therapy  Back/neck pain: Evaluated by Dr Anne Hahn, Neuro - recommends CT scan T and L spine to r/o epidural hematoma or cord compression - hold all anticoagulation for at least 1 week, then consider restart coumadin based on CT results and status of the [facility]  Encephalopathy acute: in setting of sepsis, small SAH.  Neuro following. Concern for LE myelopathy > Neuro has ordered CT  thoracic and L spine to R/O epidural abscess or bleed Plan: -Cont to monitor in Neuro ICU  -PCA fentanyl ordered for severe pain  Atrial fibrillation with rapid ventricular response.  Plan: -Cont current Rx -No anticoagulation for now  Hypokalemia  Lab 09/11/11 0430 09/10/11 0430 09/09/11 0517  K 3.3* 2.9* 3.8  plan: -replete. Monitor  Levy Pupa, MD, PhD 09/11/2011, 10:49 AM [facility] [phone] or if no answer [phone]

## 2011-09-11 NOTE — Progress Notes (Signed)
Stroke Team Progress Note  SUBJECTIVE  Carrie [name] is a 70 y.o. female who complains of bilat neck pain and upper back pain, right greater than left leg weakness. She also has small left frontal subarachnoid hemorrhage related to Warfarin related coagulopathy. She complains of headache which is 10 out of 10 but she looks a lot more comfortable.she has had her INR reversed with FFP and is 1.4 today. She has a mechanical heart valve as well as atrial fibrillation and was on therapeutic anticoagulation on admission. He has had MRI scan of the selective spine which has not shown any significant compression. The tiny subarachnoid hemorrhage his probably not enough to explain her leg weakness. The patient remains slow to respond, note some neck pain when turning her head forward, tends to keep her head turned to the right. Affect is somewhat flat. The patient is oriented to person, place, and date.  OBJECTIVE Most recent Vital Signs: Temp: 99.7 F (37.6 C) (12/21 0500) Temp src: Oral (12/21 0500) BP: 113/64 mmHg (12/21 0700) Pulse Rate: 83  (12/21 0700) Respiratory Rate: 24 O2 Saturdation: 94%  CBG (last 3)  No results found for this basename: GLUCAP:3 in the last 72 hours Intake/Output from previous day: 12/20 0701 - 12/21 0700 In: 1121 [P.O.:440; I.V.:581; IV Piggyback:100] Out: 2550 [Urine:2550]  IV Fluid Intake:      . sodium chloride 20 mL/hr (09/11/11 0310)   Medications    . cefTRIAXone (ROCEPHIN)  IV  2 g Intravenous Q12H  . digoxin  0.125 mg Oral Daily  . digoxin  0.25 mg Oral Q8H  . famotidine  20 mg Oral BID  . fentaNYL  50 mcg Intravenous Once  . fentaNYL   Intravenous Q4H  . metoprolol tartrate  25 mg Oral BID  . midazolam  2 mg Intravenous Once  . potassium chloride  40 mEq Oral Q1 Hr x 2  . rosuvastatin  40 mg Oral q1800  . topiramate  25 mg Oral BID  . DISCONTD: antiseptic oral rinse  15 mL Mouth Rinse q12n4p  . DISCONTD: chlorhexidine  15 mL Mouth Rinse BID  .  DISCONTD: metoprolol tartrate  25 mg Oral BID   Diet:   NPO Activity:  Bedrest DVT Prophylaxis:  none  Studies: CBC    Component Value Date/Time   WBC 22.1* 09/11/2011 0430   WBC 7.7 11/14/2010 1311   WBC 7.6 03/14/2010 1318   RBC 3.52* 09/11/2011 0430   RBC 4.66 11/14/2010 1311   HGB 10.5* 09/11/2011 0430   HGB 14.2 11/14/2010 1311   HGB 13.1 03/14/2010 1318   HCT 30.4* 09/11/2011 0430   HCT 41.4 11/14/2010 1311   HCT 38.8 03/14/2010 1318   PLT 224 09/11/2011 0430   PLT 230 11/14/2010 1311   PLT 284 03/14/2010 1318   MCV 86.4 09/11/2011 0430   MCV 88.8 11/14/2010 1311   MCV 92 03/14/2010 1318   MCH 29.8 09/11/2011 0430   MCH 30.5 11/14/2010 1311   MCHC 34.5 09/11/2011 0430   MCHC 34.3 11/14/2010 1311   RDW 15.1 09/11/2011 0430   RDW 15.9* 11/14/2010 1311   LYMPHSABS 0.8 09/06/2011 1200   LYMPHSABS 2.3 11/14/2010 1311   MONOABS 1.4* 09/06/2011 1200   MONOABS 0.7 11/14/2010 1311   EOSABS 0.0 09/06/2011 1200   EOSABS 0.2 11/14/2010 1311   EOSABS 0.2 03/14/2010 1318   BASOSABS 0.0 09/06/2011 1200   BASOSABS 0.1 11/14/2010 1311   BASOSABS 0.1 03/14/2010 1318   CMP  Component Value Date/Time   NA 130* 09/11/2011 0430   NA 138 03/14/2010 1318   K 3.3* 09/11/2011 0430   K 4.1 03/14/2010 1318   CL 101 09/11/2011 0430   CL 100 03/14/2010 1318   CO2 19 09/11/2011 0430   CO2 27 03/14/2010 1318   GLUCOSE 139* 09/11/2011 0430   GLUCOSE 101 03/14/2010 1318   BUN 10 09/11/2011 0430   BUN 14 03/14/2010 1318   CREATININE 0.58 09/11/2011 0430   CREATININE 0.8 03/14/2010 1318   CALCIUM 9.3 09/11/2011 0430   CALCIUM 9.9 03/14/2010 1318   PROT 8.1 09/06/2011 1200   PROT 8.8* 03/14/2010 1318   ALBUMIN 2.9* 09/06/2011 1200   AST 46* 09/06/2011 1200   AST 46* 03/14/2010 1318   ALT 16 09/06/2011 1200   ALKPHOS 97 09/06/2011 1200   ALKPHOS 125* 03/14/2010 1318   BILITOT 1.0 09/06/2011 1200   BILITOT 0.70 03/14/2010 1318   GFRNONAA >90 09/11/2011 0430   GFRAA >90 09/11/2011 0430   COAGS Lab  Results  Component Value Date   INR 1.23 09/11/2011   INR 1.40 09/10/2011   INR 2.03* 09/09/2011   Lipid Panel No results found for this basename: chol,  trig,  hdl,  cholhdl,  vldl,  ldlcalc   HgbA1C  No results found for this basename: HGBA1C   Urine Drug Screen  No results found for this basename: labopia,  cocainscrnur,  labbenz,  amphetmu,  thcu,  labbarb    Alcohol Level No results found for this basename: eth   CT of the brain   09/10/2011  Small areas of subarachnoid hemorrhage over the convexity on the left have improved.  No new area of hemorrhage or infarction.   09/09/2011  Compared to the 09/07/2011 noncontrast CT examination.  There has been an increase in amount of subarachnoid blood in the left frontal region.  Etiology of this is indeterminate.  09/07/2011 Possible tiny foci of subarachnoid hemorrhage overlying the left frontal lobe, as described above. Old lacunar infarct with small vessel ischemic changes   CT angio neck   No visible proximal great vessel dissection within limits of detection by artifact from pacemaker battery pack. No evidence for flow reducing lesion in the right or left carotid system  CT angio head No proximal carotid or basilar stenosis. No visible intracranial dissection. Suspect slight increase in left frontal subarachnoid blood. I cannot completely exclude that this increased prominence relates to a component of enhancement Recommend repeat noncontrast imaging in 24-48 hours.   CT thoracic and lumbar spine ordered   MRI of the brain  Not done due to pacemaker  2D Echocardiogram  EF 65-70%, mitral prosthesis, atrial fibrillation, no obvious source of embolus.   TEE  There is a mobile mass attached to the atrial aspect of the mitral prosthesis sewing ring. It does not interfere with disc motion and causes no obstruction. There is no evidence of periannular abscess or perivalvular leak.   CXR  No endotracheal tube.  No change in poor  aeration with basilar volume loss.   09/10/11 Improved aeration and airspace opacities.  CT Lumbar spine  No fracture or dislocation is seen. Very mild multilevel degenerative changes.  CT Thoracic spine  No fracture or dislocation is seen. Mild multilevel degenerative changes, most prominent at T11-12. Left paracentral disc herniation at C5-6. Evaluation of the lung parenchyma is constrained by respiratory motion. Patchy left upper lobe and right middle lobe opacities. Small bilateral pleural effusions with associated lower lobe  atelectasis.  EKG  atrial fibrillation   Physical Exam   Pleasant elderly African American lady who appears not to be in distress.  Neck is supple. Cardiac exam no murmur or gallop. Lungs clear to auscultation. Distal pulses are felt.  Neurological exam awake alert oriented x3 with normal speech and language function. I moments are full range without nystagmus. Face is symmetric without weakness. Motor system exam was symmetric and equal strength in upper extremities without focal weakness. No exudate exam reveals significant proximal right lower extremity weakness with 2-3/5 hip flexor and abductor and adductor strength. 4+ out of 5 strength at the knee extensors flexors as well as ankles. Mild 4/5 weakness of the left hip flexors with 4+ out of 5 strength of the left knee and ankles. Deep tendon reflexes are all brisk including upper extremities.there is no ankle or knee clonus noted Plantars are both not elicitable. Finger-to-nose coordination is accurate knee to his is impaired on the left. The patient is actually able to move both lower extremities. There appears be some component of apraxia present.  ASSESSMENT [name] is a 70 y.o. female with a left frontal hyperdensity, secondary to small subarachnoid hemorrhage likely related to warfarin  coagulopathy on therapeutic anticoagulation which has been reversed.  She has persistent posterior neck and upper  thoracic pain with proximal right greater than left leg weakness. No cord compression from degenerative spine disease versus epidural hematoma .   Endocarditis per TEE. Febrile with leukocytosis.  Mechanical heart valve and history of atrial fibrillation and needs long-term anticoagulation which may now be tricky given subarachnoid hemorrhage on therapeutic anticoagulation.   Hospital day # 5  TREATMENT/PLAN Check CT scan of the thoracic and lumbar spine to rule out epidural hematoma or epidural abscess causing cord compression. Hold anticoagulation for at least a week may need to resume warfarin after that.check TEE today.Start Topamax 25 mg twice a day for neck pain and headache. This is a complex and tricky situation with anticoagulation-related hemorrhage but patient clearly having indications for long-term anticoagulation. Currently, the patient appears to be neurologically stable.  Joaquin Music, ANP-BC, GNP-BC [facility] Pager: [phone] 09/11/2011 7:42 AM  Dr. Lesia Sago has personally reviewed chart, pertinent data, examined the patient and developed the plan of care.

## 2011-09-11 NOTE — Progress Notes (Signed)
Pt. Seen and examined. Agree with the NP/PA-C note as written.  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 Holding anticoagulation per neurology for 1 week.  Continues to spike fevers. A-fib with CVR at present on metoprolol and digoxin.  Chrystie Nose, MD Attending Cardiologist [facility]

## 2011-09-12 LAB — CBC
HCT: 31.4 % — ABNORMAL LOW (ref 36.0–46.0)
MCH: 28.4 pg (ref 26.0–34.0)
MCV: 86.5 fL (ref 78.0–100.0)
RBC: 3.63 MIL/uL — ABNORMAL LOW (ref 3.87–5.11)
RDW: 15.2 % (ref 11.5–15.5)
WBC: 21.3 10*3/uL — ABNORMAL HIGH (ref 4.0–10.5)

## 2011-09-12 LAB — CULTURE, BLOOD (ROUTINE X 2)
Culture  Setup Time: 201212162107
Culture  Setup Time: 201212162107
Culture: NO GROWTH
Special Requests: NORMAL

## 2011-09-12 LAB — BASIC METABOLIC PANEL
BUN: 11 mg/dL (ref 6–23)
CO2: 18 mEq/L — ABNORMAL LOW (ref 19–32)
Glucose, Bld: 108 mg/dL — ABNORMAL HIGH (ref 70–99)
Potassium: 4 mEq/L (ref 3.5–5.1)
Sodium: 129 mEq/L — ABNORMAL LOW (ref 135–145)

## 2011-09-12 NOTE — Progress Notes (Signed)
[facility]  DAILY PROGRESS NOTE   Subjective:  No complaints overnight. Pain is controlled on PCA. Still spiking fevers.  Objective:  Temp:  [98.7 F (37.1 C)-102.2 F (39 C)] 101.4 F (38.6 C) (12/22 0800) Pulse Rate:  [29-118] 84  (12/22 0800) Resp:  [16-28] 20  (12/22 0800) BP: (113-155)/(60-97) 155/70 mmHg (12/22 0800) SpO2:  [92 %-100 %] 99 % (12/22 0800) Weight:  [61.5 kg (135 lb 9.3 oz)] 135 lb 9.3 oz (61.5 kg) (12/22 0400) Weight change: -1.5 kg (-3 lb 4.9 oz)  Intake/Output from previous day: 12/21 0701 - 12/22 0700 In: 2229.5 [P.O.:1640; I.V.:489.5; IV Piggyback:100] Out: 2270 [Urine:2270]  Intake/Output from this shift: Total I/O In: 480 [P.O.:440; I.V.:40] Out: 115 [Urine:115]  Medications: Current Facility-Administered Medications  Medication Dose Route Frequency Provider Last Rate Last Dose  . 0.9 %  sodium chloride infusion   Intravenous Continuous Billy Fischer, MD 20 mL/hr at 09/12/11 0900    . 0.9 %  sodium chloride infusion  250 mL Intravenous PRN Kalman Shan, MD      . acetaminophen (TYLENOL) tablet 650 mg  650 mg Oral Q6H PRN Cliffton Asters, MD   650 mg at 09/12/11 1610  . cefTRIAXone (ROCEPHIN) 2 g in dextrose 5 % 50 mL IVPB  2 g Intravenous Q12H Cliffton Asters, MD   2 g at 09/12/11 0349  . digoxin (LANOXIN) tablet 0.125 mg  0.125 mg Oral Daily Eda Paschal [place], [place]   0.125 mg at 09/11/11 1010  . diphenhydrAMINE (BENADRYL) injection 12.5 mg  12.5 mg Intravenous Q6H PRN Billy Fischer, MD       Or  . diphenhydrAMINE (BENADRYL) 12.5 MG/5ML elixir 12.5 mg  12.5 mg Oral Q6H PRN Billy Fischer, MD      . famotidine (PEPCID) tablet 20 mg  20 mg Oral BID Kalman Shan, MD   20 mg at 09/11/11 2228  . fentaNYL 10 mcg/mL PCA injection   Intravenous Q4H Billy Fischer, MD   75 mcg at 09/12/11 [phone]  . metoprolol tartrate (LOPRESSOR) tablet 25 mg  25 mg Oral BID Billy Fischer, MD   25 mg at 09/11/11 2228  . naloxone [facility]) injection  0.4 mg  0.4 mg Intravenous PRN Billy Fischer, MD       And  . sodium chloride 0.9 % injection 9 mL  9 mL Intravenous PRN Billy Fischer, MD      . ondansetron [facility]) injection 4 mg  4 mg Intravenous Q6H PRN Billy Fischer, MD      . potassium chloride SA (K-DUR,KLOR-CON) CR tablet 40 mEq  40 mEq Oral Daily Leslye Peer, MD   40 mEq at 09/11/11 1217  . rosuvastatin (CRESTOR) tablet 40 mg  40 mg Oral q1800 Kalman Shan, MD   40 mg at 09/11/11 1826  . topiramate (TOPAMAX) tablet 25 mg  25 mg Oral BID Annie Main, NP   25 mg at 09/11/11 2228    Physical Exam: General appearance: alert and no distress Neck: no adenopathy, no carotid bruit, no JVD, supple, symmetrical, trachea midline and thyroid not enlarged, symmetric, no tenderness/mass/nodules Lungs: clear to auscultation bilaterally Heart: Irregularly irregular, sharp mechanical valve sounds Abdomen: soft, non-tender; bowel sounds normal; no masses,  no organomegaly Extremities: extremities normal, atraumatic, no cyanosis or edema Pulses: 2+ and symmetric  Lab Results: Results for orders placed during the hospital encounter of 09/06/11 (from the past 48 hour(s))  CBC     Status: Abnormal   Collection  Time   09/11/11  4:30 AM      Component Value Range Comment   WBC 22.1 (*) 4.0 - 10.5 (K/uL)    RBC 3.52 (*) 3.87 - 5.11 (MIL/uL)    Hemoglobin 10.5 (*) 12.0 - 15.0 (g/dL)    HCT 16.1 (*) 09.6 - 46.0 (%)    MCV 86.4  78.0 - 100.0 (fL)    MCH 29.8  26.0 - 34.0 (pg)    MCHC 34.5  30.0 - 36.0 (g/dL)    RDW 04.5  40.9 - 81.1 (%)    Platelets 224  150 - 400 (K/uL)   PROTIME-INR     Status: Abnormal   Collection Time   09/11/11  4:30 AM      Component Value Range Comment   Prothrombin Time 15.8 (*) 11.6 - 15.2 (seconds)    INR 1.23  0.00 - 1.49    BASIC METABOLIC PANEL     Status: Abnormal   Collection Time   09/11/11  4:30 AM      Component Value Range Comment   Sodium 130 (*) 135 - 145 (mEq/L)    Potassium 3.3 (*) 3.5 - 5.1  (mEq/L)    Chloride 101  96 - 112 (mEq/L)    CO2 19  19 - 32 (mEq/L)    Glucose, Bld 139 (*) 70 - 99 (mg/dL)    BUN 10  6 - 23 (mg/dL)    Creatinine, Ser 9.14  0.50 - 1.10 (mg/dL)    Calcium 9.3  8.4 - 10.5 (mg/dL)    GFR calc non Af Amer >90  >90 (mL/min)    GFR calc Af Amer >90  >90 (mL/min)   CBC     Status: Abnormal   Collection Time   09/12/11  3:50 AM      Component Value Range Comment   WBC 21.3 (*) 4.0 - 10.5 (K/uL)    RBC 3.63 (*) 3.87 - 5.11 (MIL/uL)    Hemoglobin 10.3 (*) 12.0 - 15.0 (g/dL)    HCT 78.2 (*) 95.6 - 46.0 (%)    MCV 86.5  78.0 - 100.0 (fL)    MCH 28.4  26.0 - 34.0 (pg)    MCHC 32.8  30.0 - 36.0 (g/dL)    RDW 21.3  08.6 - 57.8 (%)    Platelets 269  150 - 400 (K/uL)   PROTIME-INR     Status: Abnormal   Collection Time   09/12/11  3:50 AM      Component Value Range Comment   Prothrombin Time 15.4 (*) 11.6 - 15.2 (seconds)    INR 1.19  0.00 - 1.49    BASIC METABOLIC PANEL     Status: Abnormal   Collection Time   09/12/11  3:50 AM      Component Value Range Comment   Sodium 129 (*) 135 - 145 (mEq/L)    Potassium 4.0  3.5 - 5.1 (mEq/L) NO VISIBLE HEMOLYSIS   Chloride 101  96 - 112 (mEq/L)    CO2 18 (*) 19 - 32 (mEq/L)    Glucose, Bld 108 (*) 70 - 99 (mg/dL)    BUN 11  6 - 23 (mg/dL)    Creatinine, Ser 4.69  0.50 - 1.10 (mg/dL)    Calcium 9.6  8.4 - 10.5 (mg/dL)    GFR calc non Af Amer 88 (*) >90 (mL/min)    GFR calc Af Amer >90  >90 (mL/min)   PROCALCITONIN     Status: Normal  Collection Time   09/12/11  3:50 AM      Component Value Range Comment   Procalcitonin 0.91       Imaging: Ct Thoracic Spine Wo Contrast  09/10/2011  *RADIOLOGY REPORT*  Clinical Data:  Leg weakness  CT THORACIC AND LUMBAR SPINE WITHOUT CONTRAST  Technique:  Multidetector CT imaging of the thoracic and lumbar spine was performed without contrast. Multiplanar CT image reconstructions were also generated.  Comparison:  None  CT THORACIC SPINE  Findings:  No fracture or  dislocation is seen.  Mild multilevel degenerative changes of the thoracic spine, most prominent at T11-12.  Lower cervical spine is incompletely imaged.  Left paracentral disc herniation at C5-6 (series 9/image 14).  Visualized thyroid is unremarkable.  Cardiomegaly.  Coronary atherosclerosis.  Valve annuloplasty.  Atherosclerotic calcifications of the aortic arch.  Pacemaker leads.  Evaluation of the lung parenchyma is constrained by respiratory motion.  Scattered patchy left upper lobe opacities. Mild right middle lobe opacity / scarring.  Small bilateral pleural effusions with associated lower lobe atelectasis.  IMPRESSION: No fracture or dislocation is seen.  Mild multilevel degenerative changes, most prominent at T11-12.  Left paracentral disc herniation at C5-6.  Evaluation of the lung parenchyma is constrained by respiratory motion.  Patchy left upper lobe and right middle lobe opacities. Small bilateral pleural effusions with associated lower lobe atelectasis.  CT LUMBAR SPINE  Findings: No fracture or dislocation is seen.  Very mild multilevel degenerative changes.  Atherosclerotic calcifications of the abdominal aorta and branch vessels.  Suspected calcified uterine fibroids.  IMPRESSION: No fracture or dislocation is seen.  Very mild multilevel degenerative changes.  Original Report Authenticated By: Charline Bills, M.D.   Ct Lumbar Spine Wo Contrast  09/10/2011  *RADIOLOGY REPORT*  Clinical Data:  Leg weakness  CT THORACIC AND LUMBAR SPINE WITHOUT CONTRAST  Technique:  Multidetector CT imaging of the thoracic and lumbar spine was performed without contrast. Multiplanar CT image reconstructions were also generated.  Comparison:  None  CT THORACIC SPINE  Findings:  No fracture or dislocation is seen.  Mild multilevel degenerative changes of the thoracic spine, most prominent at T11-12.  Lower cervical spine is incompletely imaged.  Left paracentral disc herniation at C5-6 (series 9/image 14).   Visualized thyroid is unremarkable.  Cardiomegaly.  Coronary atherosclerosis.  Valve annuloplasty.  Atherosclerotic calcifications of the aortic arch.  Pacemaker leads.  Evaluation of the lung parenchyma is constrained by respiratory motion.  Scattered patchy left upper lobe opacities. Mild right middle lobe opacity / scarring.  Small bilateral pleural effusions with associated lower lobe atelectasis.  IMPRESSION: No fracture or dislocation is seen.  Mild multilevel degenerative changes, most prominent at T11-12.  Left paracentral disc herniation at C5-6.  Evaluation of the lung parenchyma is constrained by respiratory motion.  Patchy left upper lobe and right middle lobe opacities. Small bilateral pleural effusions with associated lower lobe atelectasis.  CT LUMBAR SPINE  Findings: No fracture or dislocation is seen.  Very mild multilevel degenerative changes.  Atherosclerotic calcifications of the abdominal aorta and branch vessels.  Suspected calcified uterine fibroids.  IMPRESSION: No fracture or dislocation is seen.  Very mild multilevel degenerative changes.  Original Report Authenticated By: Charline Bills, M.D.   Dg Chest Port 1 View  09/11/2011  *RADIOLOGY REPORT*  Clinical Data: Respiratory failure  PORTABLE CHEST - 1 VIEW  Comparison: 09/09/2011  Findings: Heart is moderately enlarged.  Single lead left subclavian pacemaker device unchanged.  Low volumes and hazy airspace  opacities improved.  Stable mid lung right-sided linear opacity.  Normal pulmonary vascularity.  IMPRESSION: Improved aeration and airspace opacities.  Original Report Authenticated By: Donavan Burnet, M.D.    Assessment:  1. Principal Problem: 2.  *Encephalopathy acute 3. Active Problems: 4.  Atrial fibrillation with rapid ventricular response 5.  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  Pneumococcal septicemia 7.  Subarachnoid hemorrhage on CT this adm 8.  Presence of permanent cardiac pacemaker 9.  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   Plan:  1. Continue antibiotics per ID for endocarditis. 2. I agree with Dr. Tyrone Sage, she is at high risk for valve thrombus - most studies would indicate this risk goes up about 5 days off of anticoagulation. Would consider resuming heparin asap.  Time Spent Directly with Patient:  15 minutes  Length of Stay:  LOS: 6 days   Chrystie Nose, MD Attending Cardiologist [facility]  Vienne Corcoran C 09/12/2011, 10:00 AM

## 2011-09-12 NOTE — Progress Notes (Signed)
Stroke Team Progress Note  SUBJECTIVE  Carrie [name] is a 70 y.o. 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 sodium chloride 20 mL/hr at 09/12/11 1200   Medications    . cefTRIAXone (ROCEPHIN)  IV  2 g Intravenous Q12H  . digoxin  0.125 mg Oral Daily  . famotidine  20 mg Oral BID  . fentaNYL   Intravenous Q4H  . metoprolol tartrate  25 mg Oral BID  . potassium chloride  40 mEq Oral Daily  . rosuvastatin  40 mg Oral q1800  . topiramate  25 mg Oral BID   Diet:  Full oral intake  Activity:  Up with assistance  CBC    Component Value Date/Time   WBC 21.3* 09/12/2011 0350   WBC 7.7 11/14/2010 1311   WBC 7.6 03/14/2010 1318   RBC 3.63* 09/12/2011 0350   RBC 4.66 11/14/2010 1311   HGB 10.3* 09/12/2011 0350   HGB 14.2 11/14/2010 1311   HGB 13.1 03/14/2010 1318   HCT 31.4* 09/12/2011 0350   HCT 41.4 11/14/2010 1311   HCT 38.8 03/14/2010 1318   PLT 269 09/12/2011 0350   PLT 230 11/14/2010 1311   PLT 284 03/14/2010 1318   MCV 86.5 09/12/2011 0350   MCV 88.8 11/14/2010 1311   MCV 92 03/14/2010 1318   MCH 28.4 09/12/2011 0350   MCH 30.5 11/14/2010 1311   MCHC 32.8 09/12/2011 0350   MCHC 34.3 11/14/2010 1311   RDW 15.2 09/12/2011 0350   RDW 15.9* 11/14/2010 1311   LYMPHSABS 0.8 09/06/2011 1200   LYMPHSABS 2.3 11/14/2010 1311   MONOABS 1.4* 09/06/2011 1200   MONOABS  0.7 11/14/2010 1311   EOSABS 0.0 09/06/2011 1200   EOSABS 0.2 11/14/2010 1311   EOSABS 0.2 03/14/2010 1318   BASOSABS 0.0 09/06/2011 1200   BASOSABS 0.1 11/14/2010 1311   BASOSABS 0.1 03/14/2010 1318   CMP    Component Value Date/Time   NA 129* 09/12/2011 0350   NA 138 03/14/2010 1318   K 4.0 09/12/2011 0350   K 4.1 03/14/2010 1318   CL 101 09/12/2011 0350   CL 100 03/14/2010 1318   CO2 18* 09/12/2011 0350   CO2 27 03/14/2010 1318   GLUCOSE 108* 09/12/2011 0350   GLUCOSE 101 03/14/2010 1318   BUN 11 09/12/2011 0350   BUN 14 03/14/2010 1318   CREATININE 0.66 09/12/2011 0350   CREATININE 0.8 03/14/2010 1318   CALCIUM 9.6 09/12/2011 0350   CALCIUM 9.9 03/14/2010 1318   PROT 8.1 09/06/2011 1200   PROT 8.8* 03/14/2010 1318   ALBUMIN 2.9* 09/06/2011 1200   AST 46* 09/06/2011 1200   AST 46* 03/14/2010 1318   ALT 16 09/06/2011 1200   ALKPHOS 97 09/06/2011 1200   ALKPHOS 125* 03/14/2010 1318   BILITOT 1.0 09/06/2011 1200   BILITOT 0.70  03/14/2010 1318   GFRNONAA 88* 09/12/2011 0350   GFRAA >90 09/12/2011 0350   COAGS Lab Results  Component Value Date   INR 1.19 09/12/2011   INR 1.23 09/11/2011   INR 1.40 09/10/2011   Lipid Panel No results found for this basename: chol, trig, hdl, cholhdl, vldl, ldlcalc   HgbA1C  No results found for this basename: HGBA1C   Urine Drug Screen  No results found for this basename: labopia, cocainscrnur, labbenz, amphetmu, thcu, labbarb    Alcohol Level No results found for this basename: eth     Ct Thoracic Spine Wo Contrast  09/10/2011  *RADIOLOGY REPORT*  Clinical Data:  Leg weakness  CT THORACIC AND LUMBAR SPINE WITHOUT CONTRAST  Technique:  Multidetector CT imaging of the thoracic and lumbar spine was performed without contrast. Multiplanar CT image reconstructions were also generated.  Comparison:  None  CT THORACIC SPINE  Findings:  No fracture or dislocation is seen.  Mild multilevel degenerative changes of the thoracic spine, most prominent  at T11-12.  Lower cervical spine is incompletely imaged.  Left paracentral disc herniation at C5-6 (series 9/image 14).  Visualized thyroid is unremarkable.  Cardiomegaly.  Coronary atherosclerosis.  Valve annuloplasty.  Atherosclerotic calcifications of the aortic arch.  Pacemaker leads.  Evaluation of the lung parenchyma is constrained by respiratory motion.  Scattered patchy left upper lobe opacities. Mild right middle lobe opacity / scarring.  Small bilateral pleural effusions with associated lower lobe atelectasis.  IMPRESSION: No fracture or dislocation is seen.  Mild multilevel degenerative changes, most prominent at T11-12.  Left paracentral disc herniation at C5-6.  Evaluation of the lung parenchyma is constrained by respiratory motion.  Patchy left upper lobe and right middle lobe opacities. Small bilateral pleural effusions with associated lower lobe atelectasis.  CT LUMBAR SPINE  Findings: No fracture or dislocation is seen.  Very mild multilevel degenerative changes.  Atherosclerotic calcifications of the abdominal aorta and branch vessels.  Suspected calcified uterine fibroids.  IMPRESSION: No fracture or dislocation is seen.  Very mild multilevel degenerative changes.  Original Report Authenticated By: Charline Bills, M.D.   Ct Lumbar Spine Wo Contrast  09/10/2011  *RADIOLOGY REPORT*  Clinical Data:  Leg weakness  CT THORACIC AND LUMBAR SPINE WITHOUT CONTRAST  Technique:  Multidetector CT imaging of the thoracic and lumbar spine was performed without contrast. Multiplanar CT image reconstructions were also generated.  Comparison:  None  CT THORACIC SPINE  Findings:  No fracture or dislocation is seen.  Mild multilevel degenerative changes of the thoracic spine, most prominent at T11-12.  Lower cervical spine is incompletely imaged.  Left paracentral disc herniation at C5-6 (series 9/image 14).  Visualized thyroid is unremarkable.  Cardiomegaly.  Coronary atherosclerosis.  Valve annuloplasty.   Atherosclerotic calcifications of the aortic arch.  Pacemaker leads.  Evaluation of the lung parenchyma is constrained by respiratory motion.  Scattered patchy left upper lobe opacities. Mild right middle lobe opacity / scarring.  Small bilateral pleural effusions with associated lower lobe atelectasis.  IMPRESSION: No fracture or dislocation is seen.  Mild multilevel degenerative changes, most prominent at T11-12.  Left paracentral disc herniation at C5-6.  Evaluation of the lung parenchyma is constrained by respiratory motion.  Patchy left upper lobe and right middle lobe opacities. Small bilateral pleural effusions with associated lower lobe atelectasis.  CT LUMBAR SPINE  Findings: No fracture or dislocation is seen.  Very mild multilevel degenerative changes.  Atherosclerotic calcifications of the abdominal aorta and branch vessels.  Suspected  calcified uterine fibroids.  IMPRESSION: No fracture or dislocation is seen.  Very mild multilevel degenerative changes.  Original Report Authenticated By: Charline Bills, M.D.   Dg Chest Port 1 View  09/11/2011  *RADIOLOGY REPORT*  Clinical Data: Respiratory failure  PORTABLE CHEST - 1 VIEW  Comparison: 09/09/2011  Findings: Heart is moderately enlarged.  Single lead left subclavian pacemaker device unchanged.  Low volumes and hazy airspace opacities improved.  Stable mid lung right-sided linear opacity.  Normal pulmonary vascularity.  IMPRESSION: Improved aeration and airspace opacities.  Original Report Authenticated By: Donavan Burnet, M.D.     Melvyn Novas, MD [facility] 09/12/2011 12:43 PM Stroke Team Progress Note   ISmall areas of subarachnoid hemorrhage over the convexity on the left have improved.  No new area of hemorrhage or infarction.   09/09/2011  Compared to the 09/07/2011 noncontrast CT examination.  There has been an increase in amount of subarachnoid blood in the left frontal region.  Etiology of this is indeterminate.    09/07/2011 Possible tiny foci of subarachnoid hemorrhage overlying the left frontal lobe, as described above. Old lacunar infarct with small vessel ischemic changes   CT angio neck   No visible proximal great vessel dissection within limits of detection by artifact from pacemaker battery pack. No evidence for flow reducing lesion in the right or left carotid system  CT angio head No proximal carotid or basilar stenosis. No visible intracranial dissection. Suspect slight increase in left frontal subarachnoid blood. I cannot completely exclude that this increased prominence relates to a component of enhancement Recommend repeat noncontrast imaging in 24-48 hours.   CT thoracic and lumbar spine ordered   MRI of the brain  Not done due to pacemaker  2D Echocardiogram  EF 65-70%, mitral prosthesis, atrial fibrillation, no obvious source of embolus.   TEE  There is a mobile mass attached to the atrial aspect of the mitral prosthesis sewing ring. It does not interfere with disc motion and causes no obstruction. There is no evidence of periannular abscess or perivalvular leak.   CXR  No endotracheal tube.  No change in poor aeration with basilar volume loss.   09/10/11 Improved aeration and airspace opacities.  CT Lumbar spine  No fracture or dislocation is seen. Very mild multilevel degenerative changes.  CT Thoracic spine  No fracture or dislocation is seen. Mild multilevel degenerative changes, most prominent at T11-12. Left paracentral disc herniation at C5-6. Evaluation of the lung parenchyma is constrained by respiratory motion. Patchy left upper lobe and right middle lobe opacities. Small bilateral pleural effusions with associated lower lobe atelectasis.  EKG  atrial fibrillation   Physical Exam   Pleasant elderly African American lady who appears not to be in distress. Lethargic , but not abulic ,   Found ready for transfer from 3100.  Neck is supple. No meningism, Cardiac exam :systolic  click . Lungs clear to auscultation.  Neurological exam awake alert oriented x3 with normal speech and language function. Full EOM  without nystagmus. Able to carry a conversation and give details of her medical history. ( Valve replacement in 1998, for example. )  Face is symmetric without weakness. If feeding herself, no drooling , no dysphagia.   Motor system exam was symmetric and equal strength in upper extremities without focal weakness. No exudate exam reveals significant proximal right lower extremity weakness with 2-3/5 hip flexor and abductor and adductor strength. 4+ out of 5 strength at the knee extensors flexors as  well as ankles.  Mild 4/5 weakness of the left hip flexors with 4+ out of 5 strength of the left knee and ankles.  Deep tendon reflexes are all brisk including upper extremities.there is no ankle or knee clonus noted  Plantars are both not elicitable.  Finger-to-nose coordination is accurate knee to his is impaired on the left.   The patient is actually able to move both lower extremities. There appears be some component of apraxia present.  ASSESSMENT [name] is a 70 y.o. female with a left frontal hyperdensity, secondary to small subarachnoid hemorrhage likely related to warfarin  coagulopathy on therapeutic anticoagulation which has been reversed.  She has persistent posterior neck and upper thoracic pain with proximal right greater than left leg weakness. No cord compression from degenerative spine disease versus epidural hematoma .   Endocarditis per TEE. Results for Carrie [name], Carrie [name] (MRN [id number]) as of 09/12/2011 12:53  Ref. Range 09/10/2011 13:02  ECHOCARDIOGRAM TRANSESOPHAGEAL No range found Rpt   Febrile with leukocytosis.  Mechanical heart valve and history of atrial fibrillation and needs long-term anticoagulation which may now be tricky given subarachnoid hemorrhage on therapeutic anticoagulation. Patient can move to a telemetry bed  With neuro  checks,  and alert neuro to MS changes ,    Hospital day # 6  TREATMENT/PLAN Hold anticoagulation for at least a week may need to resume warfarin after that.  TEE yesterday  confirmed the endocarditis.  Topamax 25 mg twice a day for neck pain and headache. This is a complex and tricky situation with anticoagulation-related hemorrhage but patient clearly having indications for long-term anticoagulation.  Currently, the patient appears to be neurologically stable, STROKE MD  will follow again next week, Monday 09-14-11 .   Melvyn Novas, M.D.   09/12/2011 12:43 PM

## 2011-09-12 NOTE — Progress Notes (Signed)
Patient name: Carrie [name] Medical record number: [id number] Date of birth: 07-22-1941 Age: 70 y.o. Gender: female PCP: Geraldo Pitter, MD, MD, Dr Clarene [facility]  Date: 09/12/2011 Reason for Consult: sepsis, flu syndrome, delirium  Referring Physician: DR Lynnette Caffey ER  PT PROFILE: 70 year old female who presented to [facility] ED 12/16 with 3 day history of high fever, fatigue, malaise, tachycardia, leukocytosis, elevated PCT and dx of severe sepsis. Has h/o prosthetic MV and chronic warfarin rx. Admission blood cx positive for pneumococcus. CT head 12/17 in eval of AMS revealed small SAH > anticoagulation reversed and transferred 12/19 to NICU to monitor neuro status more closely  Lines/tubes None  Culture data/sepsis markers Legionella antigen 12/17: NEG Urine strep 12/17: NEG Influenza A and B. nasopharyngeal swab antibody >>NEG MRSA PCR: NEG Multiplex respiratory virus panel 18 viruses PCR 09/06/11 >> NEG Blood culture 12/17: STREPTOCOCCUS PNEUMONIAE  Antibiotics Azithromycin 12/16>>>12/18 Tamiflu 12/16>>>12/18 Vancomycin 12/17 >> 12/19 Ceftriaxone 2 g 12/16 >>   Best practice Pepcid SCDs  Protocols/consults [facility] 12/17 Neuro 12/18 ID 12/18  Events/studies Echo 12/17: Normal EF, No vegetations seen 12/17: CT neck:Apparent linear filling defect within the right common carotid artery, possibly artifactual, although focal dissection cannot be excluded. Consider CTA/MRA neck for further characterization as clinically warranted.No evidence of fracture or dislocation.No epidural abscess/fluid collection is seen. 12/17: CT head:Suspect slight increase in left frontal subarachnoid blood. Icannot completely exclude that this increased prominence relates to a component of enhancement  12/18: CTA neck: No visible proximal great vessel dissection within limits of detection by artifact from pacemaker battery pack. No evidence for flow reducing lesion in the right or left carotid  system. 12/18 CT head: Suspect slight increase in left frontal subarachnoid blood. 12/19: CT head: Compared to the 09/07/2011 noncontrast CT examination. There has been an increase in amount of subarachnoid blood in the left frontal region. Etiology of this is indeterminate.  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 12/21: CT T and L spine: no evidence fracture, no evidence epidural abscess or bleed  Subjective More comfortable on PCA  Objective Temp:  [98.7 F (37.1 C)-102.2 F (39 C)] 101.4 F (38.6 C) (12/22 0800) Pulse Rate:  [29-118] 84  (12/22 1000) Resp:  [16-28] 24  (12/22 1000) BP: (113-155)/(60-97) 137/62 mmHg (12/22 1000) SpO2:  [92 %-100 %] 98 % (12/22 1000) Weight:  [61.5 kg (135 lb 9.3 oz)] 135 lb 9.3 oz (61.5 kg) (12/22 0400)    Intake/Output Summary (Last 24 hours) at 09/12/11 1022 Last data filed at 09/12/11 0900  Gross per 24 hour  Intake   2285 ml  Output   2225 ml  Net     60 ml   Physical exam Temp:  [98.7 F (37.1 C)-102.2 F (39 C)] 101.4 F (38.6 C) (12/22 0800) Pulse Rate:  [29-118] 84  (12/22 1000) Resp:  [16-28] 24  (12/22 1000) BP: (113-155)/(60-97) 137/62 mmHg (12/22 1000) SpO2:  [92 %-100 %] 98 % (12/22 1000) Weight:  [61.5 kg (135 lb 9.3 oz)] 135 lb 9.3 oz (61.5 kg) (12/22 0400)  General: in pain complaining of head and neck pain HEENT: mucous membrane moist, no JVD.  NECK: mild nuchal rigidity, no JVD  Pulm: clear anteriorly, no accessory muscle use Card: irregular  Irregular, rate controlled, mech S2 Abd: non-tender, no organomegally + bowel sounds GU: foley cath to straight drain EXT: no edema  radiology Ct Thoracic Spine Wo Contrast  09/10/2011  *RADIOLOGY REPORT*  Clinical Data:  Leg weakness  CT THORACIC AND LUMBAR SPINE WITHOUT CONTRAST  Technique:  Multidetector CT imaging of the thoracic and lumbar spine was performed without contrast. Multiplanar CT image reconstructions were also  generated.  Comparison:  None  CT THORACIC SPINE  Findings:  No fracture or dislocation is seen.  Mild multilevel degenerative changes of the thoracic spine, most prominent at T11-12.  Lower cervical spine is incompletely imaged.  Left paracentral disc herniation at C5-6 (series 9/image 14).  Visualized thyroid is unremarkable.  Cardiomegaly.  Coronary atherosclerosis.  Valve annuloplasty.  Atherosclerotic calcifications of the aortic arch.  Pacemaker leads.  Evaluation of the lung parenchyma is constrained by respiratory motion.  Scattered patchy left upper lobe opacities. Mild right middle lobe opacity / scarring.  Small bilateral pleural effusions with associated lower lobe atelectasis.  IMPRESSION: No fracture or dislocation is seen.  Mild multilevel degenerative changes, most prominent at T11-12.  Left paracentral disc herniation at C5-6.  Evaluation of the lung parenchyma is constrained by respiratory motion.  Patchy left upper lobe and right middle lobe opacities. Small bilateral pleural effusions with associated lower lobe atelectasis.  CT LUMBAR SPINE  Findings: No fracture or dislocation is seen.  Very mild multilevel degenerative changes.  Atherosclerotic calcifications of the abdominal aorta and branch vessels.  Suspected calcified uterine fibroids.  IMPRESSION: No fracture or dislocation is seen.  Very mild multilevel degenerative changes.  Original Report Authenticated By: Charline Bills, M.D.   Ct Lumbar Spine Wo Contrast  09/10/2011  *RADIOLOGY REPORT*  Clinical Data:  Leg weakness  CT THORACIC AND LUMBAR SPINE WITHOUT CONTRAST  Technique:  Multidetector CT imaging of the thoracic and lumbar spine was performed without contrast. Multiplanar CT image reconstructions were also generated.  Comparison:  None  CT THORACIC SPINE  Findings:  No fracture or dislocation is seen.  Mild multilevel degenerative changes of the thoracic spine, most prominent at T11-12.  Lower cervical spine is incompletely  imaged.  Left paracentral disc herniation at C5-6 (series 9/image 14).  Visualized thyroid is unremarkable.  Cardiomegaly.  Coronary atherosclerosis.  Valve annuloplasty.  Atherosclerotic calcifications of the aortic arch.  Pacemaker leads.  Evaluation of the lung parenchyma is constrained by respiratory motion.  Scattered patchy left upper lobe opacities. Mild right middle lobe opacity / scarring.  Small bilateral pleural effusions with associated lower lobe atelectasis.  IMPRESSION: No fracture or dislocation is seen.  Mild multilevel degenerative changes, most prominent at T11-12.  Left paracentral disc herniation at C5-6.  Evaluation of the lung parenchyma is constrained by respiratory motion.  Patchy left upper lobe and right middle lobe opacities. Small bilateral pleural effusions with associated lower lobe atelectasis.  CT LUMBAR SPINE  Findings: No fracture or dislocation is seen.  Very mild multilevel degenerative changes.  Atherosclerotic calcifications of the abdominal aorta and branch vessels.  Suspected calcified uterine fibroids.  IMPRESSION: No fracture or dislocation is seen.  Very mild multilevel degenerative changes.  Original Report Authenticated By: Charline Bills, M.D.   Dg Chest Port 1 View  09/11/2011  *RADIOLOGY REPORT*  Clinical Data: Respiratory failure  PORTABLE CHEST - 1 VIEW  Comparison: 09/09/2011  Findings: Heart is moderately enlarged.  Single lead left subclavian pacemaker device unchanged.  Low volumes and hazy airspace opacities improved.  Stable mid lung right-sided linear opacity.  Normal pulmonary vascularity.  IMPRESSION: Improved aeration and airspace opacities.  Original Report Authenticated By: Donavan Burnet, M.D.     LAB RESULT  Lab 09/12/11 0350 09/11/11 0430 09/10/11  0430  NA 129* 130* 135  K 4.0 3.3* 2.9*  CL 101 101 100  CO2 18* 19 26  BUN 11 10 11   CREATININE 0.66 0.58 0.61  GLUCOSE 108* 139* 119*    Lab 09/12/11 0350 09/11/11 0430 09/10/11 0430   HGB 10.3* 10.5* 10.6*  HCT 31.4* 30.4* 30.0*  WBC 21.3* 22.1* 20.8*  PLT 269 224 162    Assessment and Plan  SIRS/ Sepsis, in setting of Pneumococcal bacteremia/PVE, hemodynamically improved  Lab 09/12/11 0350 09/11/11 0430 09/10/11 0430  WBC 21.3* 22.1* 20.8*  Plan: -cont current antibiotics as directed by ID -Appreciate cardiology and TCTS involvement; no indication for SGY at this time; she is high risk for thromboembolism/CVA, and they recommend restarting anti-coag ASAP in setting [facility] -will need repeat TEE in several weeks after abx therapy  Back/neck pain: Evaluated by Dr Anne Hahn, Neuro; CT scan T and L spine to r/o epidural hematoma or cord compression negative 12/21 - on PCA, etiology unclear  Encephalopathy acute: in setting of sepsis, small SAH.  Neuro following. Concern for LE myelopathy > Neuro has ordered CT thoracic and L spine to R/O epidural abscess or bleed Plan: -Cont to monitor in Neuro ICU  -PCA fentanyl ordered for severe pain - hold all anticoagulation for at least 1 week, today day 6 of 7; plan restart heparin 12/24 if all consultants agree  Atrial fibrillation with rapid ventricular response.  Plan: -Cont current Rx -No anticoagulation for now, goal restart 12/24 as above  Hypokalemia  Lab 09/12/11 0350 09/11/11 0430 09/10/11 0430  K 4.0 3.3* 2.9*  plan: -replete. Monitor  Hyponatremia: - follow Na, no intervention at this time  Dispo:  - transfer to SDU    Levy Pupa, MD, PhD 09/12/2011, 10:22 AM [facility] [phone] or if no answer [phone]

## 2011-09-13 ENCOUNTER — Inpatient Hospital Stay (HOSPITAL_COMMUNITY): Payer: Medicare Other

## 2011-09-13 LAB — BASIC METABOLIC PANEL
CO2: 20 mEq/L (ref 19–32)
Glucose, Bld: 113 mg/dL — ABNORMAL HIGH (ref 70–99)
Potassium: 4.1 mEq/L (ref 3.5–5.1)
Sodium: 132 mEq/L — ABNORMAL LOW (ref 135–145)

## 2011-09-13 NOTE — Progress Notes (Signed)
Subjective:  Pt denies CP but is slightly SOB. She seems slightly agitated and confused  Objective:  Temp:  [97.3 F (36.3 C)-99.1 F (37.3 C)] 98.9 F (37.2 C) (12/23 0838) Pulse Rate:  [61-79] 65  (12/23 0838) Resp:  [16-29] 22  (12/23 0908) BP: (103-137)/(54-96) 128/66 mmHg (12/23 0838) SpO2:  [96 %-100 %] 96 % (12/23 0908) Weight:  [61.5 kg (135 lb 9.3 oz)-61.553 kg (135 lb 11.2 oz)] 135 lb 9.3 oz (61.5 kg) (12/23 0500) Weight change: 0.053 kg (1.9 oz)  Intake/Output from previous day: 12/22 0701 - 12/23 0700 In: 1520 [P.O.:1000; I.V.:420; IV Piggyback:100] Out: 1490 [Urine:1490]  Intake/Output from this shift: Total I/O In: -  Out: 50 [Urine:50]  Physical Exam: General appearance: alert, cooperative, mild distress and slowed mentation Lungs: clear to auscultation bilaterally Heart: irregularly irregular rhythm  Lab Results: Results for orders placed during the hospital encounter of 09/06/11 (from the past 48 hour(s))  CBC     Status: Abnormal   Collection Time   09/12/11  3:50 AM      Component Value Range Comment   WBC 21.3 (*) 4.0 - 10.5 (K/uL)    RBC 3.63 (*) 3.87 - 5.11 (MIL/uL)    Hemoglobin 10.3 (*) 12.0 - 15.0 (g/dL)    HCT 16.1 (*) 09.6 - 46.0 (%)    MCV 86.5  78.0 - 100.0 (fL)    MCH 28.4  26.0 - 34.0 (pg)    MCHC 32.8  30.0 - 36.0 (g/dL)    RDW 04.5  40.9 - 81.1 (%)    Platelets 269  150 - 400 (K/uL)   PROTIME-INR     Status: Abnormal   Collection Time   09/12/11  3:50 AM      Component Value Range Comment   Prothrombin Time 15.4 (*) 11.6 - 15.2 (seconds)    INR 1.19  0.00 - 1.49    BASIC METABOLIC PANEL     Status: Abnormal   Collection Time   09/12/11  3:50 AM      Component Value Range Comment   Sodium 129 (*) 135 - 145 (mEq/L)    Potassium 4.0  3.5 - 5.1 (mEq/L) NO VISIBLE HEMOLYSIS   Chloride 101  96 - 112 (mEq/L)    CO2 18 (*) 19 - 32 (mEq/L)    Glucose, Bld 108 (*) 70 - 99 (mg/dL)    BUN 11  6 - 23 (mg/dL)    Creatinine, Ser 9.14   0.50 - 1.10 (mg/dL)    Calcium 9.6  8.4 - 10.5 (mg/dL)    GFR calc non Af Amer 88 (*) >90 (mL/min)    GFR calc Af Amer >90  >90 (mL/min)   PROCALCITONIN     Status: Normal   Collection Time   09/12/11  3:50 AM      Component Value Range Comment   Procalcitonin 0.91     BASIC METABOLIC PANEL     Status: Abnormal   Collection Time   09/13/11  5:30 AM      Component Value Range Comment   Sodium 132 (*) 135 - 145 (mEq/L)    Potassium 4.1  3.5 - 5.1 (mEq/L)    Chloride 103  96 - 112 (mEq/L)    CO2 20  19 - 32 (mEq/L)    Glucose, Bld 113 (*) 70 - 99 (mg/dL)    BUN 15  6 - 23 (mg/dL)    Creatinine, Ser 7.82  0.50 - 1.10 (mg/dL)  Calcium 9.8  8.4 - 10.5 (mg/dL)    GFR calc non Af Amer 83 (*) >90 (mL/min)    GFR calc Af Amer >90  >90 (mL/min)     Imaging: Imaging results have been reviewed  Assessment/Plan:   1. Principal Problem: 2.  *Encephalopathy acute 3. Active Problems: 4.  Atrial fibrillation with rapid ventricular response 5.  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  Pneumococcal septicemia 7.  Subarachnoid hemorrhage on CT this adm 8.  Presence of permanent cardiac pacemaker 9.  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   Time Spent Directly with Patient:  20 minutes  Length of Stay:  LOS: 7 days   I have reviewed prior notes, data base and examined pt. 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 She has a small SAH being followed by Neuro (this developed while she was therapeutically anticoagulated on coumadin). Currently off of anticoagulants. Exam benign. The dilema involves when to reinitiate anticoagulation with iv hep to coumadin to minimize the risk of thromboembolism from the combination of CAF and a SJ MVR versus worsening the [facility]. There is no good way to evaluate/calculate the risk/benefit ratio. I think we'll have to 'split the difference' and plan to start hep in 48-72 hours  and then coumadin, continue ATBX for 6 weeks and recheck a TEE in 2-3 weeks to assess progress.   Runell Gess 09/13/2011, 10:01 AM

## 2011-09-13 NOTE — Progress Notes (Signed)
Stroke Team Progress Note  SUBJECTIVE83 year old female who presented to [facility] ED 12/16 with 3 day history of high fever, fatigue, malaise, tachycardia, leukocytosis, elevated PCT and dx of severe sepsis. Has h/o prosthetic MV and chronic warfarin rx. Admission blood cx positive for pneumococcus. CT head 12/17 in eval of AMS revealed small SAH > anticoagulation reversed and transferred 12/19 to NICU to monitor neuro status more closely . Was transferred to 2600 yesterday on 22 dec . And now is in pain , doesn't speak , every movement at the neck seems to cause excruciating pain - onset just this lunch time , after sitting up and talking with her visitors earlier. Concerned about meningism , as patients bed was lowered she was in pain , too. Flexed her knees.        OBJECTIVE Most recent Vital Signs: Temp: 98 F (36.7 C) (12/23 1229) Temp src: Oral (12/23 1229) BP: 106/54 mmHg (12/23 1229) Pulse Rate: 67  (12/23 1229) Respiratory Rate: 19 O2 Saturdation: 99% General appearance: alert, cooperative, mild distress and slowed mentation  Lungs: clear to auscultation bilaterally  Heart: irregularly irregular rhythm    CBG (last 3)  No results found for this basename: GLUCAP:3 in the last 72 hours Intake/Output from previous day: 12/22 0701 - 12/23 0700 In: 1520 [P.O.:1000; I.V.:420; IV Piggyback:100] Out: 1490 [Urine:1490]  IV Fluid Intake:     . sodium chloride 20 mL/hr (09/13/11 0618)   Medications    . cefTRIAXone (ROCEPHIN)  IV  2 g Intravenous Q12H  . digoxin  0.125 mg Oral Daily  . famotidine  20 mg Oral BID  . fentaNYL   Intravenous Q4H  . metoprolol tartrate  25 mg Oral BID  . rosuvastatin  40 mg Oral q1800  . topiramate  25 mg Oral BID   Diet:  Cardiac full diet  Activity:  Up with assistance- on hold for 09-13-11  DVT Prophylaxis:   Studies: CBC    Component Value Date/Time   WBC 21.3* 09/12/2011 0350   WBC 7.7 11/14/2010 1311   WBC 7.6 03/14/2010 1318   RBC  3.63* 09/12/2011 0350   RBC 4.66 11/14/2010 1311   HGB 10.3* 09/12/2011 0350   HGB 14.2 11/14/2010 1311   HGB 13.1 03/14/2010 1318   HCT 31.4* 09/12/2011 0350   HCT 41.4 11/14/2010 1311   HCT 38.8 03/14/2010 1318   PLT 269 09/12/2011 0350   PLT 230 11/14/2010 1311   PLT 284 03/14/2010 1318   MCV 86.5 09/12/2011 0350   MCV 88.8 11/14/2010 1311   MCV 92 03/14/2010 1318   MCH 28.4 09/12/2011 0350   MCH 30.5 11/14/2010 1311   MCHC 32.8 09/12/2011 0350   MCHC 34.3 11/14/2010 1311   RDW 15.2 09/12/2011 0350   RDW 15.9* 11/14/2010 1311   LYMPHSABS 0.8 09/06/2011 1200   LYMPHSABS 2.3 11/14/2010 1311   MONOABS 1.4* 09/06/2011 1200   MONOABS 0.7 11/14/2010 1311   EOSABS 0.0 09/06/2011 1200   EOSABS 0.2 11/14/2010 1311   EOSABS 0.2 03/14/2010 1318   BASOSABS 0.0 09/06/2011 1200   BASOSABS 0.1 11/14/2010 1311   BASOSABS 0.1 03/14/2010 1318   CMP    Component Value Date/Time   NA 132* 09/13/2011 0530   NA 138 03/14/2010 1318   K 4.1 09/13/2011 0530   K 4.1 03/14/2010 1318   CL 103 09/13/2011 0530   CL 100 03/14/2010 1318   CO2 20 09/13/2011 0530   CO2 27 03/14/2010 1318   GLUCOSE  113* 09/13/2011 0530   GLUCOSE 101 03/14/2010 1318   BUN 15 09/13/2011 0530   BUN 14 03/14/2010 1318   CREATININE 0.78 09/13/2011 0530   CREATININE 0.8 03/14/2010 1318   CALCIUM 9.8 09/13/2011 0530   CALCIUM 9.9 03/14/2010 1318   PROT 8.1 09/06/2011 1200   PROT 8.8* 03/14/2010 1318   ALBUMIN 2.9* 09/06/2011 1200   AST 46* 09/06/2011 1200   AST 46* 03/14/2010 1318   ALT 16 09/06/2011 1200   ALKPHOS 97 09/06/2011 1200   ALKPHOS 125* 03/14/2010 1318   BILITOT 1.0 09/06/2011 1200   BILITOT 0.70 03/14/2010 1318   GFRNONAA 83* 09/13/2011 0530   GFRAA >90 09/13/2011 0530   COAGS Lab Results  Component Value Date   INR 1.19 09/12/2011   INR 1.23 09/11/2011   INR 1.40 09/10/2011   Lipid Panel No results found for this basename: chol, trig, hdl, cholhdl, vldl, ldlcalc   HgbA1C   Nresults found for this basename: eth    CT of the brain     .   Physical Exam patient appears in pain, she rests on her right side in embryonal position, knees flexed, can not speak and according to the nurse was unable to move to grip the remote call button.   Full EOM without nystagmus. Yesterday was able to carry a conversation and give details of her medical history. ( Valve replacement in 1998, for example. ) - today grim asses in pain.  Face is symmetric without weakness.  Motor system exam was symmetric and equal strength in upper extremities without focal weakness. significant proximal right lower extremity weakness with 2-3/5 hip flexor and abductor and adductor strength. 4+ out of 5 strength at the knee extensors flexors as well as ankles. She has a positive kernig sign . Deep tendon reflexes are all brisk including upper extremities.there is no ankle or knee clonus noted  Plantars are both not elicitable.   Hospital day 7 [facility], endocarditis, artificial valve infection, embolism.    TREATMENT/PLAN   lower head of bed and do not raise above 30 degrees unless for meals.  CT head repeat today , rule out SAH extended or blood collection at the brainstem, causing meningism. Continue with ATB and cardiac meds.  Melvyn Novas, MD [facility] 09/13/2011 3:08 PM

## 2011-09-13 NOTE — Progress Notes (Signed)
Patient name: Carrie [name] Medical record number: [id number] Date of birth: [birth year] Age: 70 y.o. Gender: female PCP: Geraldo Pitter, MD, MD, Dr Clarene [facility]  Date: 09/13/2011 Reason for Consult: sepsis, flu syndrome, delirium  Referring Physician: DR Lynnette Caffey ER  PT PROFILE: 70 year old female who presented to [facility] ED 12/16 with 3 day history of high fever, fatigue, malaise, tachycardia, leukocytosis, elevated PCT and dx of severe sepsis. Has h/o prosthetic MV and chronic warfarin rx. Admission blood cx positive for pneumococcus. CT head 12/17 in eval of AMS revealed small SAH > anticoagulation reversed and transferred 12/19 to NICU to monitor neuro status more closely  Lines/tubes None  Culture data/sepsis markers Legionella antigen 12/17: NEG Urine strep 12/17: NEG Influenza A and B. nasopharyngeal swab antibody >>NEG MRSA PCR: NEG Multiplex respiratory virus panel 18 viruses PCR 09/06/11 >> NEG Blood culture 12/17: STREPTOCOCCUS PNEUMONIAE  Antibiotics Azithromycin 12/16>>>12/18 Tamiflu 12/16>>>12/18 Vancomycin 12/17 >> 12/19 Ceftriaxone 2 g 12/16 >>   Best practice Pepcid SCDs  Protocols/consults [facility] 12/17 Neuro 12/18 ID 12/18  Events/studies Echo 12/17: Normal EF, No vegetations seen 12/17: CT neck:Apparent linear filling defect within the right common carotid artery, possibly artifactual, although focal dissection cannot be excluded. Consider CTA/MRA neck for further characterization as clinically warranted.No evidence of fracture or dislocation.No epidural abscess/fluid collection is seen. 12/17: CT head:Suspect slight increase in left frontal subarachnoid blood. Icannot completely exclude that this increased prominence relates to a component of enhancement  12/18: CTA neck: No visible proximal great vessel dissection within limits of detection by artifact from pacemaker battery pack. No evidence for flow reducing lesion in the right or left carotid  system. 12/18 CT head: Suspect slight increase in left frontal subarachnoid blood. 12/19: CT head: Compared to the 09/07/2011 noncontrast CT examination. There has been an increase in amount of subarachnoid blood in the left frontal region. Etiology of this is indeterminate.  12/20: TEE [facility]): Findings are consistent with prosthetic valve endocarditis 12/21: CT T and L spine: no evidence fracture, no evidence epidural abscess or bleed  Subjective More comfortable on PCA, still with some intermittent agitation and confusion Hemodynamically stable No seizure activity  Objective Temp:  [97.3 F (36.3 C)-99.1 F (37.3 C)] 98.9 F (37.2 C) (12/23 0838) Pulse Rate:  [61-77] 75  (12/23 1008) Resp:  [16-29] 22  (12/23 0908) BP: (103-139)/(54-96) 139/67 mmHg (12/23 1008) SpO2:  [96 %-100 %] 96 % (12/23 0908) Weight:  [61.5 kg (135 lb 9.3 oz)-61.553 kg (135 lb 11.2 oz)] 135 lb 9.3 oz (61.5 kg) (12/23 0500)    Intake/Output Summary (Last 24 hours) at 09/13/11 1153 Last data filed at 09/13/11 0838  Gross per 24 hour  Intake    780 ml  Output   1275 ml  Net   -495 ml   Physical exam Temp:  [97.3 F (36.3 C)-99.1 F (37.3 C)] 98.9 F (37.2 C) (12/23 0838) Pulse Rate:  [61-77] 75  (12/23 1008) Resp:  [16-29] 22  (12/23 0908) BP: (103-139)/(54-96) 139/67 mmHg (12/23 1008) SpO2:  [96 %-100 %] 96 % (12/23 0908) Weight:  [61.5 kg (135 lb 9.3 oz)-61.553 kg (135 lb 11.2 oz)] 135 lb 9.3 oz (61.5 kg) (12/23 0500)  General: ill appearing elderly woman, no distress HEENT: mucous membrane moist, no JVD.  NECK: no nuchal rigidity, no JVD  Pulm: clear anteriorly, no accessory muscle use Card: irregular  Irregular, rate controlled, mech S2 Abd: non-tender, no organomegally + bowel sounds GU: foley cath to straight  drain EXT: no edema  radiology No results found.   LAB RESULT  Lab 09/13/11 0530 09/12/11 0350 09/11/11 0430  NA 132* 129* 130*  K 4.1 4.0 3.3*  CL 103 101 101  CO2 20 18*  19  BUN 15 11 10   CREATININE 0.78 0.66 0.58  GLUCOSE 113* 108* 139*    Lab 09/12/11 0350 09/11/11 0430 09/10/11 0430  HGB 10.3* 10.5* 10.6*  HCT 31.4* 30.4* 30.0*  WBC 21.3* 22.1* 20.8*  PLT 269 224 162    Assessment and Plan  Pneumococcal bacteremia/PVE, hemodynamically improved  Lab 09/12/11 0350 09/11/11 0430 09/10/11 0430  WBC 21.3* 22.1* 20.8*  Plan: -cont current antibiotics as directed by ID -Appreciate cardiology and TCTS involvement; no indication for SGY at this time; she is high risk for thromboembolism/CVA, and they recommend restarting anti-coag ASAP in setting SAH (has been off since 12/17) -will need repeat TEE in several weeks after abx therapy  Back/neck pain: Evaluated by Dr Anne Hahn, Neuro; CT scan T and L spine to r/o epidural hematoma or cord compression negative 12/21 - on PCA, etiology unclear  Encephalopathy acute: in setting of sepsis, small SAH.  Neuro following. Concern for LE myelopathy > no evidence epidural abscess or bleed on CT T and L spine Plan: -Cont to monitor neuro status -PCA fentanyl ordered for severe pain - hold all anticoagulation for at least 1 week, today day 7 of 7; plan restart heparin 12/24 if all consultants agree  Atrial fibrillation with rapid ventricular response.  Plan: -Cont current Rx -No anticoagulation for now, goal restart 12/24 as above  Hypokalemia  Lab 09/13/11 0530 09/12/11 0350 09/11/11 0430  K 4.1 4.0 3.3*  plan: -replete. Monitor  Hyponatremia: Na 132 on 12/23 - follow Na, no intervention at this time  Dispo:  - SDU until she is stable back on anticoag   Levy Pupa, MD, PhD 09/13/2011, 11:53 AM [facility] [phone] or if no answer [phone]

## 2011-09-14 LAB — BASIC METABOLIC PANEL
BUN: 15 mg/dL (ref 6–23)
CO2: 19 mEq/L (ref 19–32)
Chloride: 102 mEq/L (ref 96–112)
GFR calc Af Amer: >90 mL/min (ref >90)
Glucose, Bld: 107 mg/dL — ABNORMAL HIGH (ref 70–99)
Potassium: 3.9 mEq/L (ref 3.5–5.1)

## 2011-09-14 LAB — CBC
HCT: 30.8 % — ABNORMAL LOW (ref 36.0–46.0)
Hemoglobin: 10.7 g/dL — ABNORMAL LOW (ref 12.0–15.0)
MCH: 30 pg (ref 26.0–34.0)
MCHC: 34.7 g/dL (ref 30.0–36.0)
MCV: 86.3 fL (ref 78.0–100.0)

## 2011-09-14 LAB — CULTURE, BLOOD (ROUTINE X 2)
Culture  Setup Time: 201212180145
Culture  Setup Time: 201212180145
Culture: NO GROWTH

## 2011-09-14 MED ORDER — POLYETHYLENE GLYCOL 3350 17 G PO PACK
17.0000 g | PACK | Freq: Every day | ORAL | Status: DC | PRN
  Administered 2011-09-14: 17 g via ORAL
  Filled 2011-09-14: qty 1

## 2011-09-14 NOTE — Progress Notes (Signed)
UR Completed.  Carrie [name] Jane [phone] 09/14/2011  

## 2011-09-14 NOTE — Plan of Care (Signed)
Problem: ICU Phase Progression Outcomes Goal: Voiding-avoid urinary catheter unless indicated Outcome: Not Progressing Pt still unable to move due to pain with movement, continue with foley catheter at this time.  Problem: Phase I Progression Outcomes Goal: Progress activity as tolerated unless otherwise ordered Outcome: Not Progressing Pt having more trouble moving today

## 2011-09-14 NOTE — Progress Notes (Signed)
Patient name: Carrie [name] Medical record number: [id number] Date of birth: 06-28-1941 Age: 70 y.o. Gender: female PCP: Geraldo Pitter, MD, MD, Dr Clarene [facility]  Date: 09/14/2011 Reason for Consult: sepsis, flu syndrome, delirium  Referring Physician: DR Lynnette Caffey ER  PT PROFILE: 70 year old female who presented to [facility] ED 12/16 with 3 day history of high fever, fatigue, malaise, tachycardia, leukocytosis, elevated PCT and dx of severe sepsis. Has h/o prosthetic MV and chronic warfarin rx. Admission blood cx positive for pneumococcus. CT head 12/17 in eval of AMS revealed small SAH > anticoagulation reversed and transferred 12/19 to NICU to monitor neuro status more closely  Lines/tubes None  Culture data/sepsis markers Legionella antigen 12/17: NEG Urine strep 12/17: NEG Influenza A and B. nasopharyngeal swab antibody >>NEG MRSA PCR: NEG Multiplex respiratory virus panel 18 viruses PCR 09/06/11 >> NEG Blood culture 12/17: STREPTOCOCCUS PNEUMONIAE  Antibiotics Azithromycin 12/16>>>12/18 Tamiflu 12/16>>>12/18 Vancomycin 12/17 >> 12/19 Ceftriaxone 2 g 12/16 >>   Best practice Pepcid SCDs  Protocols/consults [facility] 12/17 Neuro 12/18 ID 12/18  Events/studies Echo 12/17: Normal EF, No vegetations seen 12/17: CT neck:Apparent linear filling defect within the right common carotid artery, possibly artifactual, although focal dissection cannot be excluded. Consider CTA/MRA neck for further characterization as clinically warranted.No evidence of fracture or dislocation.No epidural abscess/fluid collection is seen. 12/17: CT head:Suspect slight increase in left frontal subarachnoid blood. Icannot completely exclude that this increased prominence relates to a component of enhancement  12/18: CTA neck: No visible proximal great vessel dissection within limits of detection by artifact from pacemaker battery pack. No evidence for flow reducing lesion in the right or left carotid  system. 12/18 CT head: Suspect slight increase in left frontal subarachnoid blood. 12/19: CT head: Compared to the 09/07/2011 noncontrast CT examination. There has been an increase in amount of subarachnoid blood in the left frontal region. Etiology of this is indeterminate.  12/20: TEE [facility]): Findings are consistent with prosthetic valve endocarditis 12/21: CT T and L spine: no evidence fracture, no evidence epidural abscess or bleed 12/23 CT head: NSC  Subjective Remains reasonably comfortable on PCA Appetite poor Hemodynamically stable No seizure activity  Objective Temp:  [97.4 F (36.3 C)-99.9 F (37.7 C)] 97.4 F (36.3 C) (12/24 1200) Pulse Rate:  [61-75] 61  (12/24 1200) Resp:  [15-25] 24  (12/24 1200) BP: (118-160)/(48-86) 118/48 mmHg (12/24 1200) SpO2:  [96 %-100 %] 98 % (12/24 1200) Weight:  [61.2 kg (134 lb 14.7 oz)] 134 lb 14.7 oz (61.2 kg) (12/24 0500)    Intake/Output Summary (Last 24 hours) at 09/14/11 1453 Last data filed at 09/14/11 1300  Gross per 24 hour  Intake    660 ml  Output   2475 ml  Net  -1815 ml   Physical exam Temp:  [97.4 F (36.3 C)-99.9 F (37.7 C)] 97.4 F (36.3 C) (12/24 1200) Pulse Rate:  [61-75] 61  (12/24 1200) Resp:  [15-25] 24  (12/24 1200) BP: (118-160)/(48-86) 118/48 mmHg (12/24 1200) SpO2:  [96 %-100 %] 98 % (12/24 1200) Weight:  [61.2 kg (134 lb 14.7 oz)] 134 lb 14.7 oz (61.2 kg) (12/24 0500)  General: ill appearing elderly woman, no distress HEENT: mucous membrane moist, no JVD.  NECK: no nuchal rigidity, no JVD  Pulm: clear anteriorly, no accessory muscle use Card: irregular  Irregular, rate controlled, mech S2 Abd: non-tender, no organomegally + bowel sounds GU: foley cath to straight drain EXT: no edema  radiology    LAB RESULT  Lab 09/14/11 0500 09/13/11 0530 09/12/11 0350  NA 133* 132* 129*  K 3.9 4.1 4.0  CL 102 103 101  CO2 19 20 18*  BUN 15 15 11   CREATININE 0.72 0.78 0.66  GLUCOSE 107* 113* 108*     Lab 09/14/11 0500 09/12/11 0350 09/11/11 0430  HGB 10.7* 10.3* 10.5*  HCT 30.8* 31.4* 30.4*  WBC 22.0* 21.3* 22.1*  PLT 319 269 224    Assessment and Plan  Pneumococcal bacteremia/PVE, hemodynamically improved  Lab 09/14/11 0500 09/12/11 0350 09/11/11 0430  WBC 22.0* 21.3* 22.1*  Plan: -cont current antibiotics as directed by ID -Appreciate cardiology and TCTS involvement; no indication for SGY at this time; she is high risk for thromboembolism/CVA, and they recommend restarting anti-coag ASAP in setting SAH (has been off since 12/17) -will need repeat TEE in several weeks after abx therapy  Back/neck pain: Evaluated by Dr Anne Hahn, Neuro; CT scan T and L spine to r/o epidural hematoma or cord compression negative 12/21 - on PCA, etiology unclear  Encephalopathy acute: in setting of sepsis, small SAH.  No change to improved. Neuro following. Plan: -Cont to monitor neuro status -Cont PCA fentanyl ordered for severe pain -Cont to hold all anticoagulation for now per Dr Anne Hahn  Atrial fibrillation with rapid ventricular response.  Plan: -Cont current Rx -No anticoagulation for now, goal restart later this week  Hypokalemia Resolved  Hyponatremia: -resolved  Dispo:  - SDU until she is stable back on anticoag   Billy Fischer, MD;  PCCM service; [place] [phone]

## 2011-09-14 NOTE — Progress Notes (Signed)
Subjective: The patient is alert and cooperative at the time of the examination. Affect is slightly flat. Her graft the patient reports no discomfort at this time. The patient denies headache or neck pain. She has been taking in some food and fluids, but appetite remains poor.  Objective: Vital signs in last 24 hours: Temp:  [97.4 F (36.3 C)-99.9 F (37.7 C)] 97.4 F (36.3 C) (12/24 1200) Pulse Rate:  [61-75] 61  (12/24 1200) Resp:  [15-24] 24  (12/24 1200) BP: (118-160)/(48-86) 118/48 mmHg (12/24 1200) SpO2:  [96 %-100 %] 98 % (12/24 1200) Weight:  [61.2 kg (134 lb 14.7 oz)] 134 lb 14.7 oz (61.2 kg) (12/24 0500) Weight change: -0.353 kg (-12.5 oz) Last BM Date: 08/26/11  Intake/Output from previous day: 12/23 0701 - 12/24 0700 In: 680 [P.O.:140; I.V.:440; IV Piggyback:100] Out: 2725 [Urine:2725] Intake/Output this shift: Total I/O In: 120 [I.V.:120] Out: 550 [Urine:550]  @ROS @  The patient denies any headache, neck pain.  The patient denies shortness of breath, occasional cough.  The patient has had no new numbness or weakness of the face, arms, or legs.     Physical Examination:  Mental Status Exam: The patient is alert and cooperative at the time of the examination. The patient is oriented x3.  Motor Exam: The patient moves all 4 extremities, and has good strength bilaterally. The patient appears to have some apraxia with the use of the extremities. Patient is able to elevate both legs up off the bed.  Cerebellar Exam: The patient has good finger-nose-finger bilaterally. Gait was not tested.  Deep tendon reflexes: Deep tendon reflexes are symmetric throughout. Toes are downgoing bilaterally.  Cranial Nerve Exam: Facial symmetry is present. Extraocular movements are full. Visual fields are full. Speech is well enunciated, no aphasia or dysarthria is noted.    Lab Results:  Basename 09/14/11 0500 09/12/11 0350  WBC 22.0* 21.3*  HGB 10.7* 10.3*  HCT 30.8* 31.4*   PLT 319 269   BMET  Basename 09/14/11 0500 09/13/11 0530  NA 133* 132*  K 3.9 4.1  CL 102 103  CO2 19 20  GLUCOSE 107* 113*  BUN 15 15  CREATININE 0.72 0.78  CALCIUM 9.8 9.8    Studies/Results: Ct Head Wo Contrast  09/13/2011  *RADIOLOGY REPORT*  Clinical Data: New onset headache and neck soreness.  CT HEAD WITHOUT CONTRAST  Technique:  Contiguous axial images were obtained from the base of the skull through the vertex without contrast.  Comparison: Head CT scans 09/10/2011 and 09/09/2011.  Findings: Small amount of subarachnoid hemorrhage in the left frontal lobe continues to resolve.  No new hemorrhage is identified.  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  No midline shift, hydrocephalus or abnormal extra-axial fluid collection is identified.  IMPRESSION:  1.  No acute finding. 2.  Resolving subarachnoid hemorrhage on the left.  Original Report Authenticated By: Bernadene Bell. Maricela Curet, M.D.    Medications:  Scheduled:   . cefTRIAXone (ROCEPHIN)  IV  2 g Intravenous Q12H  . digoxin  0.125 mg Oral Daily  . famotidine  20 mg Oral BID  . fentaNYL   Intravenous Q4H  . metoprolol tartrate  25 mg Oral BID  . rosuvastatin  40 mg Oral q1800  . topiramate  25 mg Oral BID   Continuous:   . sodium chloride 20 mL/hr (09/14/11 0626)   JYN:WGNFAO chloride, acetaminophen, diphenhydrAMINE, diphenhydrAMINE, naloxone, ondansetron (ZOFRAN) IV, polyethylene glycol, sodium chloride  Assessment/Plan:  1. Bacterial endocarditis  2. Small sub-arachnoid hemorrhage by CT  The patient apparently had an alteration in mental status yesterday. A repeat CT scan of the head shows no acute changes, with good resolution of the small hemorrhages. Patient appears to be neurologically stable at this time. The patient is receiving ongoing treatment for her bacterial endocarditis. Anticoagulation may be considered 2 weeks following initiation of antibiotic therapy. Initiation of heparin may  be done by 09/17/2011. We will continue to follow the patient.    LOS: 8 days   Jarquis Walker KEITH 09/14/2011, 3:05 PM

## 2011-09-15 DIAGNOSIS — T827XXA Infection and inflammatory reaction due to other cardiac and vascular devices, implants and grafts, initial encounter: Secondary | ICD-10-CM

## 2011-09-15 DIAGNOSIS — A403 Sepsis due to Streptococcus pneumoniae: Secondary | ICD-10-CM

## 2011-09-15 DIAGNOSIS — I609 Nontraumatic subarachnoid hemorrhage, unspecified: Secondary | ICD-10-CM

## 2011-09-15 DIAGNOSIS — G9340 Encephalopathy, unspecified: Secondary | ICD-10-CM

## 2011-09-15 NOTE — Progress Notes (Signed)
Subjective: Patient is alert, but confused. The patient has significant perseveration. The patient does not report any discomfort.  Objective: Vital signs in last 24 hours: Temp:  [97.9 F (36.6 C)-99.3 F (37.4 C)] 98 F (36.7 C) (12/25 0900) Pulse Rate:  [60-84] 64  (12/25 1200) Resp:  [18-26] 25  (12/25 1200) BP: (86-143)/(54-78) 132/54 mmHg (12/25 1200) SpO2:  [95 %-98 %] 95 % (12/25 1200) Weight change:  Last BM Date: 08/26/11  Intake/Output from previous day: 12/24 0701 - 12/25 0700 In: 1110 [P.O.:840; I.V.:220; IV Piggyback:50] Out: 1175 [Urine:1175] Intake/Output this shift: Total I/O In: 50 [P.O.:50] Out: -   @ROS @  Review of systems is difficult to obtain secondary to confusion and perseveration. The patient seems to indicate that she's not in any discomfort at this time.   Physical Examination:  Mental Status Exam: The patient is alert and cooperative at the time of the examination. The patient is not oriented to place or date.  Motor Exam: The patient moves all 4 extremities, and has good strength bilaterally. No drift is seen with the upper extremities to  Cerebellar Exam: The patient has good finger-nose-finger and heel-to-shin bilaterally. Gait was not tested.  Deep tendon reflexes: Deep tendon reflexes are symmetric throughout. Toes are downgoing bilaterally.  Cranial Nerve Exam: Facial symmetry is present. Extraocular movements are full. Visual fields are full. Speech is well enunciated, no aphasia or dysarthria is noted.    Lab Results:  Basename 09/14/11 0500  WBC 22.0*  HGB 10.7*  HCT 30.8*  PLT 319   BMET  Basename 09/14/11 0500 09/13/11 0530  NA 133* 132*  K 3.9 4.1  CL 102 103  CO2 19 20  GLUCOSE 107* 113*  BUN 15 15  CREATININE 0.72 0.78  CALCIUM 9.8 9.8    Studies/Results: Ct Head Wo Contrast  09/13/2011  *RADIOLOGY REPORT*  Clinical Data: New onset headache and neck soreness.  CT HEAD WITHOUT CONTRAST  Technique:   Contiguous axial images were obtained from the base of the skull through the vertex without contrast.  Comparison: Head CT scans 09/10/2011 and 09/09/2011.  Findings: Small amount of subarachnoid hemorrhage in the left frontal lobe continues to resolve.  No new hemorrhage is identified.  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  No midline shift, hydrocephalus or abnormal extra-axial fluid collection is identified.  IMPRESSION:  1.  No acute finding. 2.  Resolving subarachnoid hemorrhage on the left.  Original Report Authenticated By: Bernadene Bell. Maricela Curet, M.D.    Medications:  Scheduled:   . cefTRIAXone (ROCEPHIN)  IV  2 g Intravenous Q12H  . digoxin  0.125 mg Oral Daily  . famotidine  20 mg Oral BID  . fentaNYL   Intravenous Q4H  . metoprolol tartrate  25 mg Oral BID  . rosuvastatin  40 mg Oral q1800  . topiramate  25 mg Oral BID   Continuous:   . sodium chloride 20 mL (09/15/11 0954)   JXB:JYNWGN chloride, acetaminophen, diphenhydrAMINE, diphenhydrAMINE, naloxone, ondansetron (ZOFRAN) IV, polyethylene glycol, sodium chloride  Assessment/Plan:  1. Bacterial endocarditis, pneumococcus  2. Small subarachnoid hemorrhage  3. Hypertension  The patient appears to be slightly more confused today. The patient is running a low-grade fever. There is no evidence of any focal deficits on clinical examination. Mental status in the past has waxed and waned. We will consider repeating a head scan in the future. Anticoagulation can be started in the next 2 days.    LOS: 9 days  Lesly Dukes 09/15/2011, 12:33 PM

## 2011-09-15 NOTE — Progress Notes (Signed)
Patient name: Carrie [name] Medical record number: [id number] Date of birth: 06-09-1941 Age: 70 y.o. Gender: female PCP: Geraldo Pitter, MD, MD, Dr Clarene [facility]  Date: 09/15/2011 Reason for Consult: sepsis, flu syndrome, delirium  Referring Physician: DR Lynnette Caffey ER  PT PROFILE: 70 year old female who [facility] ED 12/16 with 3 day history of high fever, fatigue, malaise, tachycardia, leukocytosis, elevated PCT and dx of severe sepsis. Has h/o prosthetic MV and chronic warfarin rx. Admission blood cx positive for pneumococcus. CT head 12/17 in eval of AMS revealed small SAH > anticoagulation reversed and transferred 12/19 to NICU to monitor neuro status more closely  TODAY- Family member in room. Patient denies distress, answering questions about pain, dyspnea, hunger as "just beautiful" No acute needs or concerns reported.   ACE inhibitor therapy was not prescribed due to medical contraindication.   Lines/tubes None  Culture data/sepsis markers Legionella antigen 12/17: NEG Urine strep 12/17: NEG Influenza A and B. nasopharyngeal swab antibody >>NEG MRSA PCR: NEG Multiplex respiratory virus panel 18 viruses PCR 09/06/11 >> NEG Blood culture 12/17: STREPTOCOCCUS PNEUMONIAE  Antibiotics Azithromycin 12/16>>>12/18 Tamiflu 12/16>>>12/18 Vancomycin 12/17 >> 12/19 Ceftriaxone 2 g 12/16 >>   Best practice Pepcid SCDs  Protocols/consults [facility] 12/17 Neuro 12/18 ID 12/18  Events/studies Echo 12/17: Normal EF, No vegetations seen 12/17: CT neck:Apparent linear filling defect within the right common carotid artery, possibly artifactual, although focal dissection cannot be excluded. Consider CTA/MRA neck for further characterization as clinically warranted.No evidence of fracture or dislocation.No epidural abscess/fluid collection is seen. 12/17: CT head:Suspect slight increase in left frontal subarachnoid blood. Icannot completely exclude that this increased prominence  relates to a component of enhancement  12/18: CTA neck: No visible proximal great vessel dissection within limits of detection by artifact from pacemaker battery pack. No evidence for flow reducing lesion in the right or left carotid system. 12/18 CT head: Suspect slight increase in left frontal subarachnoid blood. 12/19: CT head: Compared to the 09/07/2011 noncontrast CT examination. There has been an increase in amount of subarachnoid blood in the left frontal region. Etiology of this is indeterminate.  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 12/21: CT T and L spine: no evidence fracture, no evidence epidural abscess or bleed 12/23 CT head: NSC  Subjective Remains reasonably comfortable on PCA Appetite poor Hemodynamically stable No seizure activity  Objective Temp:  [97.4 F (36.3 C)-99.3 F (37.4 C)] 98 F (36.7 C) (12/25 0900) Pulse Rate:  [60-84] 74  (12/25 0957) Resp:  [18-26] 18  (12/25 0900) BP: (86-143)/(48-78) 124/56 mmHg (12/25 0957) SpO2:  [96 %-98 %] 97 % (12/25 0900)    Intake/Output Summary (Last 24 hours) at 09/15/11 1116 Last data filed at 09/15/11 0420  Gross per 24 hour  Intake    790 ml  Output   1025 ml  Net   -235 ml   Physical exam Temp:  [97.4 F (36.3 C)-99.3 F (37.4 C)] 98 F (36.7 C) (12/25 0900) Pulse Rate:  [60-84] 74  (12/25 0957) Resp:  [18-26] 18  (12/25 0900) BP: (86-143)/(48-78) 124/56 mmHg (12/25 0957) SpO2:  [96 %-98 %] 97 % (12/25 0900)  General: frail, calm elderly woman, cooperative. Moves toes to command. HEENT: mucous membrane moist, no JVD.  NECK: no nuchal rigidity, no JVD  Pulm: clear anteriorly, no accessory muscle use Card: irregular  Irregular, rate controlled, mech S2 is crisp. Abd: non-tender, no organomegally + bowel sounds GU: foley cath to straight  drain EXT: no edema  radiology    LAB RESULT  Lab 09/14/11 0500 09/13/11 0530 09/12/11 0350  NA 133* 132* 129*  K 3.9  4.1 4.0  CL 102 103 101  CO2 19 20 18*  BUN 15 15 11   CREATININE 0.72 0.78 0.66  GLUCOSE 107* 113* 108*    Lab 09/14/11 0500 09/12/11 0350 09/11/11 0430  HGB 10.7* 10.3* 10.5*  HCT 30.8* 31.4* 30.4*  WBC 22.0* 21.3* 22.1*  PLT 319 269 224    Assessment and Plan  Pneumococcal bacteremia/PVE, hemodynamically improved  Lab 09/14/11 0500 09/12/11 0350 09/11/11 0430  WBC 22.0* 21.3* 22.1*  Plan: -cont current antibiotics as directed by ID -Appreciate cardiology and TCTS involvement; no indication for SGY at this time; she is high risk for thromboembolism/CVA, and they recommend restarting anti-coag ASAP in setting SAH (has been off since 12/17). - Dr Anne Hahn Neuro- note of 12/24- Ok to restart anticoagulation after 2 weeks of antibiotics. -will need repeat TEE in several weeks after abx therapy  Back/neck pain: Evaluated by Dr Anne Hahn, Neuro; CT scan T and L spine to r/o epidural hematoma or cord compression negative 12/21 - on PCA, etiology unclear  Encephalopathy acute: in setting of sepsis, small SAH.  No change to improved. Neuro following. Plan: -Cont to monitor neuro status -Cont PCA fentanyl ordered for severe pain -Cont to hold all anticoagulation for now per Dr Anne Hahn  Atrial fibrillation with rapid ventricular response.  Plan: -Cont current Rx -No anticoagulation for now, goal restart after 2 weeks of antibiotics per Dr Anne Hahn.  Hypokalemia Resolved  Hyponatremia: -resolved  Dispo:  - SDU until she is stable back on anticoag   Billy Fischer, MD;  PCCM service; Mobile [phone]

## 2011-09-16 ENCOUNTER — Inpatient Hospital Stay (HOSPITAL_COMMUNITY): Payer: Medicare Other

## 2011-09-16 DIAGNOSIS — A403 Sepsis due to Streptococcus pneumoniae: Secondary | ICD-10-CM

## 2011-09-16 DIAGNOSIS — T827XXA Infection and inflammatory reaction due to other cardiac and vascular devices, implants and grafts, initial encounter: Secondary | ICD-10-CM

## 2011-09-16 DIAGNOSIS — G9340 Encephalopathy, unspecified: Secondary | ICD-10-CM

## 2011-09-16 DIAGNOSIS — I609 Nontraumatic subarachnoid hemorrhage, unspecified: Secondary | ICD-10-CM

## 2011-09-16 NOTE — Progress Notes (Addendum)
Patient name: Carrie [name] Medical record number: [id number] Date of birth: 03/24/1941 Age: 70 y.o. Gender: female PCP: Geraldo Pitter, MD, MD, Dr Clarene [facility]  Date: 09/16/2011 Reason for Consult: sepsis, flu syndrome, delirium  Referring Physician: DR Lynnette Caffey ER  PT PROFILE: 70 year old female who presented to [facility] ED 12/16 with 3 day history of high fever, fatigue, malaise, tachycardia, leukocytosis, elevated PCT and dx of severe sepsis. Has h/o prosthetic MV and chronic warfarin rx. Admission blood cx positive for pneumococcus. CT head 12/17 in eval of AMS revealed small SAH > anticoagulation reversed and transferred 12/19 to NICU to monitor neuro status more closely  SUBJ:  No distress. Denies pain. Seems a little confused. Calm. No distress  Lines/tubes None  Culture data/sepsis markers Legionella antigen 12/17: NEG Urine strep 12/17: NEG Influenza A and B. nasopharyngeal swab antibody >>NEG MRSA PCR: NEG Multiplex respiratory virus panel 18 viruses PCR 09/06/11 >> NEG Blood culture 12/17: STREPTOCOCCUS PNEUMONIAE  Antibiotics (Per ID) Azithromycin 12/16>>>12/18 Tamiflu 12/16>>>12/18 Vancomycin 12/17 >> 12/19 Ceftriaxone 2 g 12/16 >>   Best practice Pepcid SCDs  Protocols/consults [facility] 12/17 Neuro 12/18 ID 12/18  Events/studies Echo 12/17: Normal EF, No vegetations seen 12/17: CT neck:Apparent linear filling defect within the right common carotid artery, possibly artifactual, although focal dissection cannot be excluded. Consider CTA/MRA neck for further characterization as clinically warranted.No evidence of fracture or dislocation.No epidural abscess/fluid collection is seen. 12/17: CT head:Suspect slight increase in left frontal subarachnoid blood. Icannot completely exclude that this increased prominence relates to a component of enhancement  12/18: CTA neck: No visible proximal great vessel dissection within limits of detection by artifact from pacemaker  battery pack. No evidence for flow reducing lesion in the right or left carotid system. 12/18 CT head: Suspect slight increase in left frontal subarachnoid blood. 12/19: CT head: Compared to the 09/07/2011 noncontrast CT examination. There has been an increase in amount of subarachnoid blood in the left frontal region. Etiology of this is indeterminate.  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 12/21: CT T and L spine: no evidence fracture, no evidence epidural abscess or bleed 12/23 CT head: NSC  Subjective Remains reasonably comfortable on PCA Appetite poor Hemodynamically stable No seizure activity  Objective Temp:  [98 F (36.7 C)-101.7 F (38.7 C)] 99.1 F (37.3 C) (12/26 0823) Pulse Rate:  [56-78] 61  (12/26 1000) Resp:  [18-26] 20  (12/26 1000) BP: (127-149)/(51-74) 137/74 mmHg (12/26 1000) SpO2:  [94 %-99 %] 97 % (12/26 1000) Weight:  [64 kg (141 lb 1.5 oz)] 141 lb 1.5 oz (64 kg) (12/26 0500)    Intake/Output Summary (Last 24 hours) at 09/16/11 1107 Last data filed at 09/16/11 1000  Gross per 24 hour  Intake    380 ml  Output   2550 ml  Net  -2170 ml   Physical exam Temp:  [98 F (36.7 C)-101.7 F (38.7 C)] 99.1 F (37.3 C) (12/26 0823) Pulse Rate:  [56-78] 61  (12/26 1000) Resp:  [18-26] 20  (12/26 1000) BP: (127-149)/(51-74) 137/74 mmHg (12/26 1000) SpO2:  [94 %-99 %] 97 % (12/26 1000) Weight:  [64 kg (141 lb 1.5 oz)] 141 lb 1.5 oz (64 kg) (12/26 0500)  General: frail, calm elderly woman, cooperative. Moves toes to command. HEENT: mucous membrane moist, no JVD.  NECK: no nuchal rigidity, no JVD  Pulm: clear anteriorly, no accessory muscle use Card: irregular  Irregular, rate controlled, mech S2 is crisp. Abd: non-tender,  no organomegally + bowel sounds GU: foley cath to straight drain EXT: no edema    LAB RESULT No new Labs or CXR  Assessment and Plan  Pneumococcal bacteremia/PVE, hemodynamically improved -cont  current antibiotics as directed by ID -Appreciate cardiology and TCTS involvement; no indication for SGY at this time; she is high risk for thromboembolism/CVA, and they recommend restarting anti-coag ASAP in setting SAH (has been off since 12/17). - Dr Anne Hahn Neuro- note of 12/24- Ok to restart anticoagulation in next day or two -will need repeat TEE in several weeks after abx therapy  Back/neck pain: Evaluated by Dr Anne Hahn, Neuro; CT scan T and L spine to r/o epidural hematoma or cord compression negative 12/21 - on PCA, etiology unclear - pain now well controlled  Encephalopathy acute: in setting of sepsis, small SAH.  No change to improved. Neuro following. Plan: -Cont to monitor neuro status -Cont PCA fentanyl ordered for severe pain  Atrial fibrillation with rapid ventricular response.  Plan: rate well controlled now -Cont current Rx.  Hypokalemia Resolved  Hyponatremia: -resolved  Dispo:  - SDU until she is stable back on anticoag   Billy Fischer, MD;  PCCM service; Mobile [phone]

## 2011-09-16 NOTE — Progress Notes (Signed)
Subjective:  Awake, denies any complaints  Objective:  Vital Signs in the last 24 hours: Temp:  [98 F (36.7 C)-101.7 F (38.7 C)] 98 F (36.7 C) (12/26 0400) Pulse Rate:  [56-78] 56  (12/26 0400) Resp:  [18-26] 22  (12/26 0458) BP: (124-144)/(51-67) 132/67 mmHg (12/26 0400) SpO2:  [94 %-99 %] 96 % (12/26 0458) Weight:  [64 kg (141 lb 1.5 oz)] 141 lb 1.5 oz (64 kg) (12/26 0500)  Intake/Output from previous day:  Intake/Output Summary (Last 24 hours) at 09/16/11 0814 Last data filed at 09/16/11 0401  Gross per 24 hour  Intake    350 ml  Output   2550 ml  Net  -2200 ml    Physical Exam: General appearance: alert, cooperative and confused Lungs: decvreased breath sounds Heart: irregularly irregular rhythm and positive valve sounds   Rate: 75  Rhythm: atrial fibrillation and pacing on demand  Lab Results:  Basename 09/14/11 0500  WBC 22.0*  HGB 10.7*  PLT 319    Basename 09/14/11 0500  NA 133*  K 3.9  CL 102  CO2 19  GLUCOSE 107*  BUN 15  CREATININE 0.72   No results found for this basename: TROPONINI:2,CK,MB:2 in the last 72 hours Hepatic Function Panel No results found for this basename: PROT,ALBUMIN,AST,ALT,ALKPHOS,BILITOT,BILIDIR,IBILI in the last 72 hours No results found for this basename: CHOL in the last 72 hours No results found for this basename: INR in the last 72 hours  Imaging: No results found.  Cardiac Studies:  Assessment/Plan:   Principal Problem:  *Encephalopathy acute Active Problems:  Pneumococcal septicemia  Subarachnoid hemorrhage on CT this adm  Endocarditis of prosthetic valve  Atrial fibrillation with rapid ventricular response  S/P mitral valve replacement, [employer]  Chronic anticoagulation, ( INR goal 2.5-3.5 under normal circumstances for [employer] MVR)  Presence of permanent cardiac pacemaker    Plan- ? resume anticoagulation in AM  [facility] PA-C 09/16/2011, 8:14 AM

## 2011-09-16 NOTE — Plan of Care (Signed)
Problem: ICU Phase Progression Outcomes Goal: Voiding-avoid urinary catheter unless indicated Outcome: Not Progressing Is already catheterized

## 2011-09-16 NOTE — Progress Notes (Signed)
Pt. Seen and examined. Agree with the NP/PA-C note as written. Would restart heparin in am tomorrow.  Chrystie Nose, MD Attending Cardiologist [facility]

## 2011-09-16 NOTE — Progress Notes (Signed)
Patient ID: Carrie [name], female   DOB: [birth year], 70 y.o.   MRN: [id number] Subjective: Patient is alert.  No complaints.    Objective: Vital signs in last 24 hours: Temp:  [98 F (36.7 C)-101.7 F (38.7 C)] 99.1 F (37.3 C) (12/26 0823) Pulse Rate:  [56-78] 61  (12/26 1000) Resp:  [18-26] 20  (12/26 1000) BP: (127-149)/(51-74) 137/74 mmHg (12/26 1000) SpO2:  [94 %-99 %] 97 % (12/26 1000) Weight:  [64 kg (141 lb 1.5 oz)] 141 lb 1.5 oz (64 kg) (12/26 0500) Weight change:  Last BM Date: 09/14/11  Intake/Output from previous day: 12/25 0701 - 12/26 0700 In: 370 [P.O.:230; I.V.:140] Out: 2550 [Urine:2550] Intake/Output this shift: Total I/O In: 60 [I.V.:60] Out: -   Review of systems is difficult to obtain secondary to confusion and perseveration. The patient seems to indicate that she's not in any discomfort at this time.  Physical Examination:  CV: SYSTOLIC MURMUR AND MECH CLICK.  Mental Status Exam: The patient is alert and cooperative at the time of the examination. The patient is not oriented to place or date.  Motor Exam: The patient moves all 4 extremities; POOR ATTENTION AND EFFORT.  Cerebellar Exam: FINGER TAP SLIGHTLY SLOW ON LUE.   Deep tendon reflexes: Deep tendon reflexes are symmetric throughout. Toes are downgoing bilaterally.  Cranial Nerve Exam: PUPILS 3-->2.  EOMI.  Facial symmetry is present. Extraocular movements are full. Visual fields are full. Speech is well enunciated, no aphasia or dysarthria is noted.  Lab Results:  Basename 09/14/11 0500  WBC 22.0*  HGB 10.7*  HCT 30.8*  PLT 319   BMET  Basename 09/14/11 0500  NA 133*  K 3.9  CL 102  CO2 19  GLUCOSE 107*  BUN 15  CREATININE 0.72  CALCIUM 9.8    Studies/Results: No results found.  Medications:  Scheduled:    . cefTRIAXone (ROCEPHIN)  IV  2 g Intravenous Q12H  . digoxin  0.125 mg Oral Daily  . famotidine  20 mg Oral BID  . fentaNYL   Intravenous Q4H  . metoprolol  tartrate  25 mg Oral BID  . rosuvastatin  40 mg Oral q1800  . topiramate  25 mg Oral BID   Continuous:    . sodium chloride 20 mL (09/15/11 0954)   WUJ:WJXBJY chloride, acetaminophen, diphenhydrAMINE, diphenhydrAMINE, naloxone, ondansetron (ZOFRAN) IV, polyethylene glycol, sodium chloride  Assessment/Plan:  1. Bacterial endocarditis, pneumococcus  2. Small subarachnoid hemorrhage (suspect traumatic)  3. Hypertension  Patient still febrile.  Mental status in the past has waxed and waned. Repeat head CT today. Anticoagulation may be started once CT shows complete resolution of intracerebral hemorrhage. Suspect this is traumatic subarachnoid hemorrhage. If CT shows subtle residual hemorrhage, then may start aspirin 325 mg daily.   LOS: 10 days   PENUMALLI,VIKRAM 09/16/2011, 11:42 AM

## 2011-09-17 ENCOUNTER — Inpatient Hospital Stay (HOSPITAL_COMMUNITY): Payer: Medicare Other

## 2011-09-17 LAB — CBC
HCT: 34.5 % — ABNORMAL LOW (ref 36.0–46.0)
MCV: 86.9 fL (ref 78.0–100.0)
RBC: 3.97 MIL/uL (ref 3.87–5.11)
RDW: 15.2 % (ref 11.5–15.5)
WBC: 19.6 10*3/uL — ABNORMAL HIGH (ref 4.0–10.5)

## 2011-09-17 LAB — BASIC METABOLIC PANEL
CO2: 23 mEq/L (ref 19–32)
Chloride: 103 mEq/L (ref 96–112)
Sodium: 136 mEq/L (ref 135–145)

## 2011-09-17 LAB — HEPARIN LEVEL (UNFRACTIONATED): Heparin Unfractionated: <0.1 IU/mL — ABNORMAL LOW (ref 0.30–0.70)

## 2011-09-17 LAB — PROTIME-INR: INR: 1.4 (ref 0.00–1.49)

## 2011-09-17 MED ORDER — HEPARIN SOD (PORCINE) IN D5W 100 UNIT/ML IV SOLN
1700.0000 [IU]/h | INTRAVENOUS | Status: DC
  Administered 2011-09-18: 1450 [IU]/h via INTRAVENOUS
  Administered 2011-09-19 – 2011-09-20 (×2): 1700 [IU]/h via INTRAVENOUS
  Filled 2011-09-17 (×4): qty 250

## 2011-09-17 MED ORDER — HEPARIN SOD (PORCINE) IN D5W 100 UNIT/ML IV SOLN
800.0000 [IU]/h | INTRAVENOUS | Status: DC
  Administered 2011-09-17: 800 [IU]/h via INTRAVENOUS
  Filled 2011-09-17: qty 250

## 2011-09-17 NOTE — Progress Notes (Signed)
Patient name: Carrie [name] Medical record number: [id number] Date of birth: 1940-10-21 Age: 70 y.o. Gender: female PCP: Geraldo Pitter, MD, MD, Dr Clarene [facility]  Date: 09/17/2011 Reason for Consult: sepsis, flu syndrome, delirium  Referring Physician: DR Lynnette Caffey ER  PT PROFILE: 70 year old female who presented to [facility] ED 12/16 with 3 day history of high fever, fatigue, malaise, tachycardia, leukocytosis, elevated PCT and dx of severe sepsis. Has h/o prosthetic MV and chronic warfarin rx. Admission blood cx positive for pneumococcus. CT head 12/17 in eval of AMS revealed small SAH > anticoagulation reversed and transferred 12/19 to NICU to monitor neuro status more closely  SUBJ:  No distress. Denies pain. Seems a little confused. Calm. No distress  Lines/tubes None  Culture data/sepsis markers Legionella antigen 12/17: NEG Urine strep 12/17: NEG Influenza A and B. nasopharyngeal swab antibody >>NEG MRSA PCR: NEG Multiplex respiratory virus panel 18 viruses PCR 09/06/11 >> NEG Blood culture 12/17: STREPTOCOCCUS PNEUMONIAE  Antibiotics (Per ID) Azithromycin 12/16>>>12/18 Tamiflu 12/16>>>12/18 Vancomycin 12/17 >> 12/19 Ceftriaxone 2 g 12/16 >>   Best practice Pepcid SCDs  Protocols/consults [facility] 12/17 Neuro 12/18 ID 12/18  Events/studies Echo 12/17: Normal EF, No vegetations seen 12/17: CT neck:Apparent linear filling defect within the right common carotid artery, possibly artifactual, although focal dissection cannot be excluded. Consider CTA/MRA neck for further characterization as clinically warranted.No evidence of fracture or dislocation.No epidural abscess/fluid collection is seen. 12/17: CT head:Suspect slight increase in left frontal subarachnoid blood. Icannot completely exclude that this increased prominence relates to a component of enhancement  12/18: CTA neck: No visible proximal great vessel dissection within limits of detection by artifact from pacemaker  battery pack. No evidence for flow reducing lesion in the right or left carotid system. 12/18 CT head: Suspect slight increase in left frontal subarachnoid blood. 12/19: CT head: Compared to the 09/07/2011 noncontrast CT examination. There has been an increase in amount of subarachnoid blood in the left frontal region. Etiology of this is indeterminate.  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 12/21: CT T and L spine: no evidence fracture, no evidence epidural abscess or bleed 12/23 CT head: [facility] 12/26 CT head: NSC  Subjective Remains comfortable on PCA Appetite poor Hemodynamically stable No seizure activity  Objective Temp:  [97.2 F (36.2 C)-101.7 F (38.7 C)] 98.4 F (36.9 C) (12/27 0807) Pulse Rate:  [57-87] 87  (12/27 0907) Resp:  [17-30] 24  (12/27 0807) BP: (124-153)/(54-72) 136/72 mmHg (12/27 0907) SpO2:  [96 %-98 %] 97 % (12/27 0807) Weight:  [60.8 kg (134 lb 0.6 oz)] 134 lb 0.6 oz (60.8 kg) (12/27 0044)    Intake/Output Summary (Last 24 hours) at 09/17/11 1045 Last data filed at 09/17/11 0912  Gross per 24 hour  Intake    866 ml  Output   1250 ml  Net   -384 ml   Physical exam Temp:  [97.2 F (36.2 C)-101.7 F (38.7 C)] 98.4 F (36.9 C) (12/27 0807) Pulse Rate:  [57-87] 87  (12/27 0907) Resp:  [17-30] 24  (12/27 0807) BP: (124-153)/(54-72) 136/72 mmHg (12/27 0907) SpO2:  [96 %-98 %] 97 % (12/27 0807) Weight:  [60.8 kg (134 lb 0.6 oz)] 134 lb 0.6 oz (60.8 kg) (12/27 0044)  General: frail, calm elderly woman, cooperative. Moves toes to command. HEENT: mucous membrane moist, no JVD.  NECK: no nuchal rigidity, no JVD  Pulm: clear anteriorly, no accessory muscle use Card: irregular  Irregular, rate controlled, mech S2  is crisp. Abd: non-tender, no organomegally + bowel sounds GU: foley cath to straight drain EXT: no edema   LAB RESULT BMET    Component Value Date/Time   NA 136 09/17/2011 0700   NA 138 03/14/2010  1318   K 3.9 09/17/2011 0700   K 4.1 03/14/2010 1318   CL 103 09/17/2011 0700   CL 100 03/14/2010 1318   CO2 23 09/17/2011 0700   CO2 27 03/14/2010 1318   GLUCOSE 115* 09/17/2011 0700   GLUCOSE 101 03/14/2010 1318   BUN 18 09/17/2011 0700   BUN 14 03/14/2010 1318   CREATININE 0.78 09/17/2011 0700   CREATININE 0.8 03/14/2010 1318   CALCIUM 10.0 09/17/2011 0700   CALCIUM 9.9 03/14/2010 1318   GFRNONAA 83* 09/17/2011 0700   GFRAA >90 09/17/2011 0700    CBC    Component Value Date/Time   WBC 19.6* 09/17/2011 0444   HGB 11.7* 09/17/2011 0444   HCT 34.5* 09/17/2011 0444   PLT 359 09/17/2011 0444   CXR: 12/27 NSC  Assessment and Plan  Pneumococcal bacteremia due to PVE -cont current antibiotics as directed by ID  - 2 weeks of ceftriaxone @ 2g q 12 hrs (completed 09/23/11) followed by 4 weeks of ceftriaxone @ 2g daily (through 10/22/11) -Cardiology following - repeat TEE sometime next week -TCTS following - will need redo MVR eventually  SAH - likely related to septic embolization. Also had hx of fall PTA. Perhaps traumatic -Neurology following.  -Heparin to be resumed today due to high risk of thrombosis on prosthetic valve  Back/neck pain: Likely due to blood in subarachnoid space -Controlled on PCA  Encephalopathy acute.  -Persists. Likely due to [facility]. Will D/C nonessential meds. Avoid benzo's  Atrial fibrillation with rapid ventricular response.  -rate well controlled now -Cont current Rx.  Hypokalemia Resolved  Hyponatremia: -resolved  Dispo:  -If tolerates heparin, can be transferred out of SDU   Billy Fischer, MD;  PCCM service; Mobile [phone]

## 2011-09-17 NOTE — Progress Notes (Signed)
[facility]  DAILY PROGRESS NOTE   Subjective:  Denies any subjective complaints. As always speaks in very short sentences often with monosyllabic responses  Objective:  Temp:  [97.2 F (36.2 C)-101.7 F (38.7 C)] 98.4 F (36.9 C) (12/27 0807) Pulse Rate:  [57-78] 75  (12/27 0807) Resp:  [17-30] 24  (12/27 0807) BP: (124-153)/(54-74) 153/71 mmHg (12/27 0807) SpO2:  [96 %-98 %] 97 % (12/27 0807) Weight:  [60.8 kg (134 lb 0.6 oz)] 134 lb 0.6 oz (60.8 kg) (12/27 0044) Weight change: -3.2 kg (-7 lb 0.9 oz)  Intake/Output from previous day: 12/26 0701 - 12/27 0700 In: 626 [P.O.:406; I.V.:220] Out: 1150 [Urine:1150]  Intake/Output from this shift: Total I/O In: -  Out: 100 [Urine:100]  Medications: Current Facility-Administered Medications  Medication Dose Route Frequency Provider Last Rate Last Dose  . 0.9 %  sodium chloride infusion   Intravenous Continuous Billy Fischer, MD 20 mL/hr at 09/15/11 0954 20 mL at 09/15/11 0954  . 0.9 %  sodium chloride infusion  250 mL Intravenous PRN Kalman Shan, MD      . acetaminophen (TYLENOL) tablet 650 mg  650 mg Oral Q6H PRN Cliffton Asters, MD   650 mg at 09/16/11 1754  . cefTRIAXone (ROCEPHIN) 2 g in dextrose 5 % 50 mL IVPB  2 g Intravenous Q12H Cliffton Asters, MD   2 g at 09/17/11 0342  . digoxin (LANOXIN) tablet 0.125 mg  0.125 mg Oral Daily Eda Paschal [place], PA   0.125 mg at 09/16/11 0932  . diphenhydrAMINE (BENADRYL) injection 12.5 mg  12.5 mg Intravenous Q6H PRN Billy Fischer, MD       Or  . diphenhydrAMINE (BENADRYL) 12.5 MG/5ML elixir 12.5 mg  12.5 mg Oral Q6H PRN Billy Fischer, MD      . famotidine (PEPCID) tablet 20 mg  20 mg Oral BID Kalman Shan, MD   20 mg at 09/16/11 0932  . fentaNYL 10 mcg/mL PCA injection   Intravenous Q4H Billy Fischer, MD   45 mcg at 09/17/11 0400  . metoprolol tartrate (LOPRESSOR) tablet 25 mg  25 mg Oral BID Billy Fischer, MD   25 mg at 09/16/11 0932  . naloxone [facility])  injection 0.4 mg  0.4 mg Intravenous PRN Billy Fischer, MD       And  . sodium chloride 0.9 % injection 9 mL  9 mL Intravenous PRN Billy Fischer, MD      . ondansetron [facility]) injection 4 mg  4 mg Intravenous Q6H PRN Billy Fischer, MD      . polyethylene glycol (MIRALAX / GLYCOLAX) packet 17 g  17 g Oral Daily PRN Billy Fischer, MD   17 g at 09/14/11 1754  . rosuvastatin (CRESTOR) tablet 40 mg  40 mg Oral q1800 Kalman Shan, MD   40 mg at 09/16/11 1748  . topiramate (TOPAMAX) tablet 25 mg  25 mg Oral BID Annie Main, NP   25 mg at 09/16/11 2213    Physical Exam: General appearance: alert, cooperative and no distress Neck: no adenopathy, no carotid bruit, no JVD, supple, symmetrical, trachea midline, thyroid not enlarged, symmetric, no tenderness/mass/nodules and the neck is still fairly tense and flexed to the right, although somewhat less stiff than last week Lungs: clear to auscultation bilaterally Heart: irregularly irregular rhythm, S1, S2 normal and systolic murmur: holosystolic 2/6, blowing at lower left sternal border. Prosthetic valve clicks are still quite sharp, there is no diastolic rumble Abdomen: soft, non-tender; bowel sounds normal;  no masses,  no organomegaly Extremities: extremities normal, atraumatic, no cyanosis or edema Pulses: 2+ and symmetric Skin: Skin color, texture, turgor normal. No rashes or lesions  Lab Results: Results for orders placed during the hospital encounter of 09/06/11 (from the past 48 hour(s))  CBC     Status: Abnormal   Collection Time   09/17/11  4:44 AM      Component Value Range Comment   WBC 19.6 (*) 4.0 - 10.5 (K/uL)    RBC 3.97  3.87 - 5.11 (MIL/uL)    Hemoglobin 11.7 (*) 12.0 - 15.0 (g/dL)    HCT 16.1 (*) 09.6 - 46.0 (%)    MCV 86.9  78.0 - 100.0 (fL)    MCH 29.5  26.0 - 34.0 (pg)    MCHC 33.9  30.0 - 36.0 (g/dL)    RDW 04.5  40.9 - 81.1 (%)    Platelets 359  150 - 400 (K/uL)   APTT     Status: Normal   Collection Time    09/17/11  4:44 AM      Component Value Range Comment   aPTT 34  24 - 37 (seconds)   PROTIME-INR     Status: Abnormal   Collection Time   09/17/11  4:44 AM      Component Value Range Comment   Prothrombin Time 17.4 (*) 11.6 - 15.2 (seconds)    INR 1.40  0.00 - 1.49    BASIC METABOLIC PANEL     Status: Abnormal   Collection Time   09/17/11  7:00 AM      Component Value Range Comment   Sodium 136  135 - 145 (mEq/L)    Potassium 3.9  3.5 - 5.1 (mEq/L)    Chloride 103  96 - 112 (mEq/L)    CO2 23  19 - 32 (mEq/L)    Glucose, Bld 115 (*) 70 - 99 (mg/dL)    BUN 18  6 - 23 (mg/dL)    Creatinine, Ser 9.14  0.50 - 1.10 (mg/dL)    Calcium 78.2  8.4 - 10.5 (mg/dL)    GFR calc non Af Amer 83 (*) >90 (mL/min)    GFR calc Af Amer >90  >90 (mL/min)     Imaging: Ct Head Wo Contrast  09/16/2011  *RADIOLOGY REPORT*  Clinical Data: Follow up subarachnoid hemorrhage  CT HEAD WITHOUT CONTRAST  Technique:  Contiguous axial images were obtained from the base of the skull through the vertex without contrast.  Comparison: 09/13/2011  Findings: Near complete resolution of prior left frontal subarachnoid hemorrhage.  No parenchymal hemorrhage or new extra- axial hemorrhage is seen.  No mass lesion, mass effect, or midline shift.  Subcortical white matter and periventricular small vessel ischemic changes.  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  Mild global cortical atrophy.  No ventriculomegaly.  The visualized paranasal sinuses are essentially clear. The mastoid air cells are unopacified.  No evidence of calvarial fracture.  IMPRESSION: No evidence of acute intracranial abnormality.  Near complete resolution of prior left frontal subarachnoid hemorrhage.  Original Report Authenticated By: Charline Bills, M.D.   Dg Chest Port 1 View  09/17/2011  *RADIOLOGY REPORT*  Clinical Data: Respiratory failure, shortness of breath  PORTABLE CHEST - 1 VIEW  Comparison: 09/11/2011; 09/09/2011; 09/08/2011; chest CT -  09/10/2011  Findings:  Grossly unchanged enlarged cardiac silhouette and mediastinal contours post median sternotomy and valve replacement given decreased lung volumes.  Grossly unchanged positioning of single lead pacemaker with tip overlying the expected  location of the right ventricle.  Decreased lung volumes with corresponding interval increase in right perihilar heterogeneous opacities. Cephalization of flow with thickening along the right minor fissure.  No definite pleural effusion or pneumothorax.  Unchanged bones.  IMPRESSION: 1.  Decreased lung volumes with interval increase in right perihilar opacities to a represent atelectasis. 2.  Query mild pulmonary edema.  Original Report Authenticated By: Waynard Reeds, M.D.    Assessment:  1. Principal Problem: 2.  *Encephalopathy acute 3. Active Problems: 4.  Atrial fibrillation with rapid ventricular response 5.  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  Pneumococcal septicemia 7.  Subarachnoid hemorrhage on CT this adm 8.  Presence of permanent cardiac pacemaker 9.  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   Plan:  1. Mechanical mitral valve prosthesis.Today is day 9 without therapeutic levels of anticoagulation (INR less than 2.0). At this point the risk of embolic complications or catastrophic valve thrombosis seems to me to be higher than worsening of intracranial hemorrhage. I would strongly recommend resuming heparin anticoagulation. I do not think that aspirin would offer much benefit in the setting of a mechanical mitral valve prosthesis and permanent atrial fibrillation. Heparin may be started without a bolus allowing the anticoagulant effects to institute more gradually and avoid "overshoot". I would also recommend starting warfarin today. It'll take at least 3 or 4 days to see any fracture of vitamin K antagonists. 2. Permanent atrial  fibrillation satisfactory rate control 3. She remains febrile and has an elevated white blood cell count, consistent with endocarditis. Protracted febrile illness may persist for a couple of weeks even in the presence of appropriate antibiotic therapy. If fever is still present more than 2 weeks following initiation of antibiotic therapy there is unfortunately a high likelihood that we will be unable to sterilize the bloodstream/ mechanical valve. This would be one indication that she will require repeat mitral valve replacement surgery. Obviously this would be an extremely high risk intervention and will have to judiciously considered before a decision is made. A repeat TEE is indicated sometime next week. 4. Subarachnoid hemorrhage virtually completely resolved by CT of the head   Time Spent Directly with Patient:  30 minutes  Length of Stay:  LOS: 11 days    Carrie [name] 09/17/2011, 8:31 AM

## 2011-09-17 NOTE — Progress Notes (Signed)
Patient ID: Carrie [name], female   DOB: 06/18/1941, 70 y.o.   MRN: [id number] Subjective: Patient is alert.  No complaints.   The patient is perseverating. The patient is oriented to person, month, and year. The patient is oriented to place.  Objective: Vital signs in last 24 hours: Temp:  [97.2 F (36.2 C)-101.7 F (38.7 C)] 98.4 F (36.9 C) (12/27 0807) Pulse Rate:  [57-87] 87  (12/27 0907) Resp:  [17-30] 24  (12/27 0807) BP: (124-153)/(54-74) 136/72 mmHg (12/27 0907) SpO2:  [96 %-98 %] 97 % (12/27 0807) Weight:  [60.8 kg (134 lb 0.6 oz)] 134 lb 0.6 oz (60.8 kg) (12/27 0044) Weight change: -3.2 kg (-7 lb 0.9 oz) Last BM Date:  (unk)  Intake/Output from previous day: 12/26 0701 - 12/27 0700 In: 626 [P.O.:406; I.V.:220] Out: 1150 [Urine:1150] Intake/Output this shift: Total I/O In: 300 [P.O.:300] Out: 100 [Urine:100]  Physical Examination:  CV: SYSTOLIC MURMUR AND MECH CLICK.  Mental Status Exam: The patient is alert and cooperative at the time of the examination. The patient is not oriented to place or date.  Motor Exam: The patient moves all 4 extremities; POOR ATTENTION AND EFFORT. The patient moves the lower extent is less well.  Cerebellar Exam: The patient is able to perform finger-nose-finger. The patient appears to have motor impersistence with using the lower extremities. The patient was unable form toe to finger on either side.  Deep tendon reflexes: Deep tendon reflexes are symmetric throughout. Toes are downgoing bilaterally.  Cranial Nerve Exam: PUPILS 3-->2.  EOMI.  Facial symmetry is present. Extraocular movements are full. Visual fields are full. Speech is well enunciated, no aphasia or dysarthria is noted.  Lab Results:  [facility] 09/17/11 0444  WBC 19.6*  HGB 11.7*  HCT 34.5*  PLT 359   BMET  Basename 09/17/11 0700  NA 136  K 3.9  CL 103  CO2 23  GLUCOSE 115*  BUN 18  CREATININE 0.78  CALCIUM 10.0    Studies/Results: Ct Head Wo  Contrast  09/16/2011  *RADIOLOGY REPORT*  Clinical Data: Follow up subarachnoid hemorrhage  CT HEAD WITHOUT CONTRAST  Technique:  Contiguous axial images were obtained from the base of the skull through the vertex without contrast.  Comparison: 09/13/2011  Findings: Near complete resolution of prior left frontal subarachnoid hemorrhage.  No parenchymal hemorrhage or new extra- axial hemorrhage is seen.  No mass lesion, mass effect, or midline shift.  Subcortical white matter and periventricular small vessel ischemic changes.  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  Mild global cortical atrophy.  No ventriculomegaly.  The visualized paranasal sinuses are essentially clear. The mastoid air cells are unopacified.  No evidence of calvarial fracture.  IMPRESSION: No evidence of acute intracranial abnormality.  Near complete resolution of prior left frontal subarachnoid hemorrhage.  Original Report Authenticated By: Charline Bills, M.D.   Dg Chest Port 1 View  09/17/2011  *RADIOLOGY REPORT*  Clinical Data: Respiratory failure, shortness of breath  PORTABLE CHEST - 1 VIEW  Comparison: 09/11/2011; 09/09/2011; 09/08/2011; chest CT - 09/10/2011  Findings:  Grossly unchanged enlarged cardiac silhouette and mediastinal contours post median sternotomy and valve replacement given decreased lung volumes.  Grossly unchanged positioning of single lead pacemaker with tip overlying the expected location of the right ventricle.  Decreased lung volumes with corresponding interval increase in right perihilar heterogeneous opacities. Cephalization of flow with thickening along the right minor fissure.  No definite pleural effusion or pneumothorax.  Unchanged bones.  IMPRESSION: 1.  Decreased  lung volumes with interval increase in right perihilar opacities to a represent atelectasis. 2.  Query mild pulmonary edema.  Original Report Authenticated By: Waynard Reeds, M.D.    Medications:  Scheduled:    . cefTRIAXone  (ROCEPHIN)  IV  2 g Intravenous Q12H  . digoxin  0.125 mg Oral Daily  . famotidine  20 mg Oral BID  . fentaNYL   Intravenous Q4H  . metoprolol tartrate  25 mg Oral BID  . rosuvastatin  40 mg Oral q1800  . topiramate  25 mg Oral BID   Continuous:    . sodium chloride 20 mL (09/15/11 0954)   EAV:WUJWJX chloride, acetaminophen, diphenhydrAMINE, diphenhydrAMINE, naloxone, ondansetron (ZOFRAN) IV, polyethylene glycol, sodium chloride  Assessment/Plan:  1. Bacterial endocarditis, pneumococcus  2. Small subarachnoid hemorrhage (suspect traumatic). CT stable  3. Hypertension  4. Atrial fibrillation  5. Mechanical heart valve, on anticoagulation prior to admission  Patient still febrile.  Mental status in the past has waxed and waned.  Anticoagulation may be started once CT shows complete resolution of intracerebral hemorrhage. The CT scan of the head looks good at this point. I would agree with cardiology that the patient needs to be back on anticoagulation. Orders for heparin will be given, without bolus. We will follow the patient.   LOS: 11 days   BIBY,SHARON 09/17/2011, 9:50 AM  Dr. Lesia Sago has personally reviewed chart, pertinent data, examined the patient and developed the plan of care.

## 2011-09-17 NOTE — Progress Notes (Signed)
ANTICOAGULATION CONSULT NOTE - Initial Consult  Pharmacy Consult for Heparin Indication: atrial fibrillation, [employer]. Jude MVR  Allergies  Allergen Reactions  . Codeine Nausea And Vomiting    Patient Measurements: Height: 5\' 1"  (154.9 cm) Weight: 134 lb 0.6 oz (60.8 kg) IBW/kg (Calculated) : 47.8   Vital Signs: Temp: 98.4 F (36.9 C) (12/27 0807) Temp src: Axillary (12/27 0807) BP: 136/72 mmHg (12/27 0907) Pulse Rate: 87  (12/27 0907)  Labs:  Basename 09/17/11 0700 09/17/11 0444  HGB -- 11.7*  HCT -- 34.5*  PLT -- 359  APTT -- 34  LABPROT -- 17.4*  INR -- 1.40  HEPARINUNFRC -- --  CREATININE 0.78 --  CKTOTAL -- --  CKMB -- --  TROPONINI -- --   Estimated Creatinine Clearance: 55.5 ml/min (by C-G formula based on Cr of 0.78).  Medical History: Past Medical History  Diagnosis Date  . Hypertension   . Atrial fibrillation     normal coroonaries cath 2004. Dr Clarene [facility]   . Sick sinus syndrome     Dr Sharrell Ku. EP study negative for inducibel arrythmia. ? pacer since 2007  . Hyperlipidemia   . Arthritis   . Anxiety   . Diverticula of colon   . Pneumonia 2009    resolved.? OPD Rx    Medications:  Scheduled:    . cefTRIAXone (ROCEPHIN)  IV  2 g Intravenous Q12H  . digoxin  0.125 mg Oral Daily  . famotidine  20 mg Oral BID  . fentaNYL   Intravenous Q4H  . metoprolol tartrate  25 mg Oral BID  . DISCONTD: rosuvastatin  40 mg Oral q1800  . DISCONTD: topiramate  25 mg Oral BID    Assessment: 70 year old woman with a.fib and St. Jude MVR to resume anticoagulation today.  Heparin and Warfarin have been on hold due to small subarachnoid hemorrhage.   Goal of Therapy:  Heparin level 0.3-0.7 units/ml   Plan:  Start heparin drip at 800 units/hour.  No Bolus.  Check Heparin in 6 hours.  Adjust per goal.  Mickeal Skinner 09/17/2011,11:30 AM

## 2011-09-17 NOTE — Progress Notes (Signed)
ANTICOAGULATION CONSULT NOTE - Initial Consult  Pharmacy Consult for Heparin Indication: atrial fibrillation, [employer]. Jude MVR  HL = <0.1 (goal 0.3 - 0.7 units/mL, aim for lower end in setting of subarachnoid hemorrhage) Heparin weight = 60kg  Assessment: Heparin level undetectable on 800 units/hr.  RN reported heparin was probably still infusing through the old IV site while a new one was placed this afternoon.  No bleeding documented.  Plan: - Increase heparin gtt to 1000 units/hr, no bolus - Check 6 hour heparin level   Phillips Climes, PharmD 09/17/2011

## 2011-09-18 LAB — CBC
Hemoglobin: 11.8 g/dL — ABNORMAL LOW (ref 12.0–15.0)
RBC: 3.88 MIL/uL (ref 3.87–5.11)
WBC: 18.8 10*3/uL — ABNORMAL HIGH (ref 4.0–10.5)

## 2011-09-18 LAB — HEPARIN LEVEL (UNFRACTIONATED)
Heparin Unfractionated: <0.1 IU/mL — ABNORMAL LOW (ref 0.30–0.70)
Heparin Unfractionated: 0.11 [IU]/mL — ABNORMAL LOW (ref 0.30–0.70)

## 2011-09-18 NOTE — Progress Notes (Addendum)
Subjective: No chest pain.  Objective: Vital signs in last 24 hours: Temp:  [97.5 F (36.4 C)-99.7 F (37.6 C)] 97.5 F (36.4 C) (12/28 0745) Pulse Rate:  [64-215] 215  (12/28 0745) Resp:  [18-28] 21  (12/28 0745) BP: (117-158)/(59-90) 145/80 mmHg (12/28 0745) SpO2:  [92 %-100 %] 96 % (12/28 0745) Weight:  [62.3 kg (137 lb 5.6 oz)] 137 lb 5.6 oz (62.3 kg) (12/28 0000) Weight change: 1.5 kg (3 lb 4.9 oz) Last BM Date: 09/18/11 Intake/Output from previous day:-1230 12/27 0701 - 12/28 0700 In: 470 [P.O.:420; IV Piggyback:50] Out: 1700 [Urine:1700] Intake/Output this shift: Total I/O In: -  Out: 350 [Urine:350]  PE: General: A&O X 3, pleasant affect. Is a bit confused - mild encephalopathy. Heart:S1S2 irreg, irreg.  Soft HSM ~1/6. Sharp valve clicks.  Lungs:Clear without rales, rhonchi. Normal Effort. Abd:+BS, soft, non-tender. Ext:No clubbing/cyanosis or edema.  2+ pulses bilaterally   Lab Results:  Basename 09/18/11 0317 09/17/11 0444  WBC 18.8* 19.6*  HGB 11.8* 11.7*  HCT 33.0* 34.5*  PLT 296 359   BMET  Basename 09/17/11 0700  NA 136  K 3.9  CL 103  CO2 23  GLUCOSE 115*  BUN 18  CREATININE 0.78  CALCIUM 10.0   No results found for this basename: TROPONINI:2,CK,MB:2 in the last 72 hours  No results found for this basename: CHOL, HDL, LDLCALC, LDLDIRECT, TRIG, CHOLHDL   No results found for this basename: HGBA1C     Lab Results  Component Value Date   TSH 0.453 09/06/2011    Hepatic Function Panel No results found for this basename: PROT,ALBUMIN,AST,ALT,ALKPHOS,BILITOT,BILIDIR,IBILI in the last 72 hours No results found for this basename: CHOL in the last 72 hours No results found for this basename: PROTIME in the last 72 hours    EKG: Orders placed during the hospital encounter of 09/06/11  . EKG    Studies/Results: Ct Head Wo Contrast  09/16/2011  *RADIOLOGY REPORT*  Clinical Data: Follow up subarachnoid hemorrhage  CT HEAD WITHOUT CONTRAST   Technique:  Contiguous axial images were obtained from the base of the skull through the vertex without contrast.  Comparison: 09/13/2011  Findings: Near complete resolution of prior left frontal subarachnoid hemorrhage.  No parenchymal hemorrhage or new extra- axial hemorrhage is seen.  No mass lesion, mass effect, or midline shift.  Subcortical white matter and periventricular small vessel ischemic changes.  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  Mild global cortical atrophy.  No ventriculomegaly.  The visualized paranasal sinuses are essentially clear. The mastoid air cells are unopacified.  No evidence of calvarial fracture.  IMPRESSION: No evidence of acute intracranial abnormality.  Near complete resolution of prior left frontal subarachnoid hemorrhage.  Original Report Authenticated By: Charline Bills, M.D.   Dg Chest Port 1 View  09/17/2011  *RADIOLOGY REPORT*  Clinical Data: Respiratory failure, shortness of breath  PORTABLE CHEST - 1 VIEW  Comparison: 09/11/2011; 09/09/2011; 09/08/2011; chest CT - 09/10/2011  Findings:  Grossly unchanged enlarged cardiac silhouette and mediastinal contours post median sternotomy and valve replacement given decreased lung volumes.  Grossly unchanged positioning of single lead pacemaker with tip overlying the expected location of the right ventricle.  Decreased lung volumes with corresponding interval increase in right perihilar heterogeneous opacities. Cephalization of flow with thickening along the right minor fissure.  No definite pleural effusion or pneumothorax.  Unchanged bones.  IMPRESSION: 1.  Decreased lung volumes with interval increase in right perihilar opacities to a represent atelectasis. 2.  Query mild pulmonary  edema.  Original Report Authenticated By: Waynard Reeds, M.D.    Medications: I have reviewed the patient's current medications.  Assessment/Plan: Patient Active Problem List  Diagnoses  . CONSTIPATION  . Encephalopathy acute  .  Atrial fibrillation with rapid ventricular response  . S/P mitral valve replacement, St Jude  . Pneumococcal septicemia  . Subarachnoid hemorrhage on CT this adm  . Muscle weakness of lower extremity  . Presence of permanent cardiac pacemaker  . Chronic anticoagulation, ( INR goal 2.5-3.5 under normal circumstances for ST Jude MVR)  . Endocarditis of prosthetic valve   PLAN:  IV heparin has been started.   A. Fib rate controlled.  See Dr. Erin Hearing note of yesterday. Had been on hold secondary to small subarachnoid hemorrhage. Will repeat TEE on WED. Or Thurs. next week. Recommend to begin coumadin today vs tomorrow.   LOS: 12 days   INGOLD,LAURA R 09/18/2011, 9:22 AM  Pt seen & examined with Ms. Beaman [place]. I agree with her note above.    Plan:  1. Mechanical mitral valve prosthesis. Restarted IV Heparin yesterday with no acute issues.  -- Would restart Warfarin @ home dose today vs. Tomorrow.  At this point the risk of embolic complications or catastrophic valve thrombosis seems to me to be higher than worsening of intracranial hemorrhage. See Dr. Tessie Fass note for full explanation. 2. Permanent atrial fibrillation satisfactory rate control with PPM.  Also indication for anticoagulation. 3. She was afebrile overnight, but continues to have an elevated white blood cell count, consistent with endocarditis. Protracted febrile illness may persist for a couple of weeks even in the presence of appropriate antibiotic therapy. If fever is still present more than 2 weeks following initiation of antibiotic therapy there is unfortunately a high likelihood that we will be unable to sterilize the bloodstream/ mechanical valve. This would be one indication that she will require repeat mitral valve replacement surgery. Obviously this would be an extremely high risk intervention and will have to judiciously considered before a decision is made.   A repeat TEE is indicated sometime next week - will schedule  for either Wed or Thurs (pending MD availability - Dr. Royann Shivers or [facility])  Plan for now is to complete high dose Abx through next week then decrease Rocephin to 2gm daily until end of January (to complete ~6weeks of couverage).    Would then want to to a re-look TTE after completing this course -- to determine need for chronic suppressive Abx vs considering re-do MVR -- will need to involve Dr. Tyrone Sage in this decision. 4. Subarachnoid hemorrhage virtually completely resolved by CT of the head.  Monitor while re-instituting anticoagulation. 5. Will f/u with Dr. Clarene Duke after discharge.   Marykay Lex, M.D., M.S. [facility] [address] [place], [place]  [address]  [phone]  09/18/2011 9:56 AM

## 2011-09-18 NOTE — Progress Notes (Signed)
Physical Therapy Evaluation Patient Details Name: Carrie [name] MRN: [id number] DOB: September 09, 1941 Today's Date: 09/18/2011  Problem List:  Patient Active Problem List  Diagnoses  . CONSTIPATION  . Encephalopathy acute  . Atrial fibrillation with rapid ventricular response  . S/P mitral valve replacement, St Jude  . Pneumococcal septicemia  . Subarachnoid hemorrhage on CT this adm  . Muscle weakness of lower extremity  . Presence of permanent cardiac pacemaker  . Chronic anticoagulation, ( INR goal 2.5-3.5 under normal circumstances for ST Jude MVR)  . Endocarditis of prosthetic valve    Past Medical History:  Past Medical History  Diagnosis Date  . Hypertension   . Atrial fibrillation     normal coroonaries cath 2004. Dr Clarene Duke [facility]   . Sick sinus syndrome     Dr Sharrell Ku. EP study negative for inducibel arrythmia. ? pacer since 2007  . Hyperlipidemia   . Arthritis   . Anxiety   . Diverticula of colon   . Pneumonia 2009    resolved.? OPD Rx   Past Surgical History:  Past Surgical History  Procedure Date  . Pacemaker insertion   . Mitral valve replacement 1998    St Jude  . Appendectomy   . Tubal ligation     PT Assessment/Plan/Recommendation PT Assessment Clinical Impression Statement: Patient with poor balance and poor endurance for activity.  Will benefit from PT to address balance and endurance issues.  Continue PT.  If she has 24 hour assist, can D/C home with family.  If no 24 hour care, NHP.   PT Recommendation/Assessment: Patient will need skilled PT in the acute care venue PT Problem List: Decreased activity tolerance;Decreased balance;Decreased mobility;Decreased cognition;Decreased knowledge of use of DME;Decreased safety awareness;Decreased knowledge of precautions PT Therapy Diagnosis : Generalized weakness PT Plan PT Frequency: Min 3X/week PT Treatment/Interventions: DME instruction;Gait training;Functional mobility training;Therapeutic  activities;Therapeutic exercise;Balance training;Patient/family education PT Recommendation Follow Up Recommendations: Home health PT;24 hour supervision/assistance Equipment Recommended: None recommended by PT PT Goals  Acute Rehab PT Goals PT Goal Formulation: With patient Time For Goal Achievement: 2 weeks Pt will go Supine/Side to Sit: with supervision PT Goal: Supine/Side to Sit - Progress: Other (comment) Pt will go Sit to Stand: with min assist;with cues (comment type and amount) PT Goal: Sit to Stand - Progress: Other (comment) Pt will Transfer Bed to Chair/Chair to Bed: with min assist PT Transfer Goal: Bed to Chair/Chair to Bed - Progress: Other (comment) Pt will Ambulate: 16 - 50 feet;with min assist;with least restrictive assistive device PT Goal: Ambulate - Progress: Other (comment)  PT Evaluation Precautions/Restrictions  Precautions Precautions: Fall Required Braces or Orthoses: No Restrictions Weight Bearing Restrictions: No Prior Functioning  Home Living Lives With:  (? spouse) Receives Help From: Personal care attendant (few days a week, few hours a day for B/D) Type of Home: House Home Layout: Multi-level;Full bath on main level Home Access: Stairs to enter [employer] of Steps: few Bathroom Shower/Tub: Health visitor: Standard Home Adaptive Equipment: Walker - rolling;Bedside commode/3-in-1;Shower chair with back Additional Comments: All of the above information questionable as pt. gave info and she is confused. Prior Function Level of Independence: Requires assistive device for independence;Needs assistance with ADLs;Needs assistance with tranfers;Needs assistance with gait Bath: Maximal Toileting: Moderate Dressing: Moderate Driving: No Cognition Cognition Arousal/Alertness: Awake/alert Overall Cognitive Status: Impaired Attention: Impaired Current Attention Level: Focused Attention - Other Comments: seconds  only Memory: Appears impaired Memory Deficits: Cannot recall home environment info Orientation Level:  Oriented to person;Disoriented to time;Disoriented to situation;Disoriented to place Following Commands: Follows one step commands inconsistently;Follows multi-step commands inconsistently Safety/Judgement: Decreased awareness of safety precautions;Decreased safety judgement for tasks assessed Awareness of Deficits: Decreased awareness of deficits Sensation/Coordination Sensation Light Touch: Appears Intact Stereognosis: Not tested Hot/Cold: Not tested Proprioception: Not tested Coordination Gross Motor Movements are Fluid and Coordinated: Yes Fine Motor Movements are Fluid and Coordinated: Yes Extremity Assessment RUE Assessment RUE Assessment: Within Functional Limits LUE Assessment LUE Assessment: Within Functional Limits RLE Assessment RLE Assessment: Within Functional Limits LLE Assessment LLE Assessment: Within Functional Limits Mobility (including Balance) Bed Mobility Bed Mobility: No Transfers Transfers: Yes Sit to Stand: 1: +2 Total assist;Patient percentage (comment);With upper extremity assist;With armrests;From chair/3-in-1 (pt = 50%) Sit to Stand Details (indicate cue type and reason): cues for hand placement for sit to stand.  Pt. had difficulty with anterior translation of pelvis for sit to stand.  Weight bears on heels with narrow BOS.   Stand to Sit: 3: Mod assist;With upper extremity assist;With armrests;To chair/3-in-1 Stand to Sit Details: assist to control descent Ambulation/Gait Ambulation/Gait: Yes Ambulation/Gait Assistance: 1: +2 Total assist;Patient percentage (comment) (pt = 65%) Ambulation/Gait Assistance Details (indicate cue type and reason): Needed a lot of postural cues for stability.  Leans posteriorly with weight on heels.   Ambulation Distance (Feet): 12 Feet Assistive device: Rolling walker Gait Pattern: Step-to pattern;Decreased stride  length;Shuffle;Lateral trunk lean to right (Leaned right as she fatigued.) Stairs: No Wheelchair Mobility Wheelchair Mobility: No  Posture/Postural Control Posture/Postural Control: Postural limitations Postural Limitations: Flexed posture overall.   Balance Balance Assessed: No Exercise    End of Session PT - End of Session Equipment Utilized During Treatment: Gait belt Activity Tolerance: Patient limited by fatigue Patient left: in chair;with call bell in reach;with family/visitor present Nurse Communication: Mobility status for transfers General Behavior During Session: [facility] for tasks performed Cognition: Impaired  INGOLD,Andron Marrazzo 09/18/2011, 1:26 PM [facility] Acute Rehabilitation [phone] (pager)

## 2011-09-18 NOTE — Progress Notes (Signed)
ANTICOAGULATION CONSULT NOTE - Initial Consult  Pharmacy Consult for Heparin Indication: atrial fibrillation, St. Jude MVR  Allergies  Allergen Reactions  . Codeine Nausea And Vomiting    Patient Measurements: Height: 5\' 1"  (154.9 cm) Weight: 137 lb 5.6 oz (62.3 kg) IBW/kg (Calculated) : 47.8   Vital Signs: Temp: 98.9 F (37.2 C) (12/28 0417) Temp src: Oral (12/28 0417) BP: 134/65 mmHg (12/28 0417) Pulse Rate: 68  (12/28 0417)  Labs:  Basename 09/18/11 0317 09/17/11 1958 09/17/11 0700 09/17/11 0444  HGB 11.8* -- -- 11.7*  HCT 33.0* -- -- 34.5*  PLT 296 -- -- 359  APTT -- -- -- 34  LABPROT -- -- -- 17.4*  INR -- -- -- 1.40  HEPARINUNFRC <0.10* <0.10* -- --  CREATININE -- -- 0.78 --  CKTOTAL -- -- -- --  CKMB -- -- -- --  TROPONINI -- -- -- --   Estimated Creatinine Clearance: 56.2 ml/min (by C-G formula based on Cr of 0.78).  Assessment: 70 year old female with Afib/St. Jude's MVR for anticoagulation.  Infusing OK per RN.   Goal of Therapy:  Heparin level 0.3-0.7 units/ml   Plan:  Increase Heparin 1250 unit/hr.  Check heparin level in 8 hours.  Amaira Safley, Gary Fleet 09/18/2011,4:37 AM

## 2011-09-18 NOTE — Progress Notes (Signed)
   CARE MANAGEMENT NOTE 09/18/2011  Patient:  Carrie [name], Carrie [name]   Account Number:  [id number]  Date Initiated:  09/14/2011  Documentation initiated by:  [facility]  Subjective/Objective Assessment:   Tx to [facility] with SAH - now with pericarditis - lives with spouse.     Action/Plan:   Anticipated DC Date:  09/18/2011   Anticipated DC Plan:  HOME W HOME HEALTH SERVICES  In-house referral  Clinical Social Worker      DC Planning Services  CM consult      Choice offered to / List presented to:             Status of service:  In process, will continue to follow Medicare Important Message given?   (If response is "NO", the following Medicare IM given date fields will be blank) Date Medicare IM given:   Date Additional Medicare IM given:    Discharge Disposition:    Per UR Regulation:  Reviewed for med. necessity/level of care/duration of stay  Comments:  09/18/11 Carrie Naim,RN,BSN 1500 SPOKE WITH PT'S [facility] [place], [phone] TO DISCUSS DC PLANS.  PT EVALUATION DONE TODAY.  PT IS 2+ASSIST AND RECOMMENDATION IS FOR SNF PLACEMENT UNLESS 24HR SUPERVISION CAN BE PROVIDED.  HUSBAND STATES HE FEELS PT WILL NEED SNF FOR REHAB, AS HE MAY NOT BE ABLE TO HANDLE HER BY HIMSELF. HE WISHES TO DISCUSS THIS WITH PT'S SISTER, WHO IS A SOCIAL WORKER, AND WORKS AT [employer].  WILL REFER TO CSW TO DISCUSS WITH HUSBAND AND PT ON MONDAY.  WILL FOLLOW. Phone #[phone]   09/16/11 Carrie Cosner,RN,BSN MET WITH PT TO DISCUSS DC PLANS.  PTA, PT LIVES WITH HUSBAND.   SHE IS STILL SOMEWHAT CONFUSED.  PT WILL NEED PT AND OT CONSULTS WHEN ABLE TO TOLERATE MEDICALLY.  MD PLEASE ORDER WHEN APPROPRIATE. Phone #[phone]  09-14-11 Carrie [name] - [phone] UR completed.

## 2011-09-18 NOTE — Progress Notes (Addendum)
Patient name: Carrie [name] Medical record number: [id number] Date of birth: September 12, 1941 Age: 70 y.o. Gender: female PCP: Geraldo Pitter, MD, MD, Dr Clarene Duke [facility]  Date: 09/18/2011 Reason for Consult: sepsis, flu syndrome, delirium  Referring Physician: DR Lynnette Caffey ER  PT PROFILE: 70 year old female who presented to [facility] ED 12/16 with 3 day history of high fever, fatigue, malaise, tachycardia, leukocytosis, elevated PCT and dx of severe sepsis. Has h/o prosthetic MV and chronic warfarin rx. Admission blood cx positive for pneumococcus. CT head 12/17 in eval of AMS revealed small SAH > anticoagulation reversed and transferred 12/19 to NICU to monitor neuro status more closely  Lines/tubes None  Culture data/sepsis markers Legionella antigen 12/17: NEG Urine strep 12/17: NEG Influenza A and B. nasopharyngeal swab antibody >>NEG MRSA PCR: NEG Multiplex respiratory virus panel 18 viruses PCR 09/06/11 >> NEG Blood culture 12/17: STREPTOCOCCUS PNEUMONIAE  Antibiotics (Per ID) Azithromycin 12/16>>>12/18 Tamiflu 12/16>>>12/18 Vancomycin 12/17 >> 12/19 Ceftriaxone 12/16 >>   Best practice Pepcid Full dose heparin started 12/27  Protocols/consults [facility] 12/17 Neuro 12/18 ID 12/18  Events/studies Echo 12/17: Normal EF, No vegetations seen 12/17: CT neck:Apparent linear filling defect within the right common carotid artery, possibly artifactual, although focal dissection cannot be excluded. Consider CTA/MRA neck for further characterization as clinically warranted.No evidence of fracture or dislocation.No epidural abscess/fluid collection is seen. 12/17: CT head:Suspect slight increase in left frontal subarachnoid blood. Icannot completely exclude that this increased prominence relates to a component of enhancement  12/18: CTA neck: No visible proximal great vessel dissection within limits of detection by artifact from pacemaker battery pack. No evidence for flow reducing lesion in the  right or left carotid system. 12/18 CT head: Suspect slight increase in left frontal subarachnoid blood. 12/19: CT head: Compared to the 09/07/2011 noncontrast CT examination. There has been an increase in amount of subarachnoid blood in the left frontal region. Etiology of this is indeterminate.  12/20: TEE [facility]): Findings are consistent with prosthetic valve endocarditis 12/21: CT T and L spine: no evidence fracture, no evidence epidural abscess or bleed 12/23 CT head: [facility] 12/26 CT head: NSC  Subjective Remains comfortable on PCA Appetite poor Hemodynamically stable No new complaints  Objective Temp:  [97.5 F (36.4 C)-99.7 F (37.6 C)] 97.5 F (36.4 C) (12/28 0745) Pulse Rate:  [64-215] 215  (12/28 0745) Resp:  [18-28] 21  (12/28 0745) BP: (117-158)/(59-90) 145/80 mmHg (12/28 0745) SpO2:  [92 %-100 %] 96 % (12/28 0745) Weight:  [62.3 kg (137 lb 5.6 oz)] 137 lb 5.6 oz (62.3 kg) (12/28 0000)    Intake/Output Summary (Last 24 hours) at 09/18/11 0939 Last data filed at 09/18/11 0745  Gross per 24 hour  Intake    170 ml  Output   1950 ml  Net  -1780 ml   Physical exam Temp:  [97.5 F (36.4 C)-99.7 F (37.6 C)] 97.5 F (36.4 C) (12/28 0745) Pulse Rate:  [64-215] 215  (12/28 0745) Resp:  [18-28] 21  (12/28 0745) BP: (117-158)/(59-90) 145/80 mmHg (12/28 0745) SpO2:  [92 %-100 %] 96 % (12/28 0745) Weight:  [62.3 kg (137 lb 5.6 oz)] 137 lb 5.6 oz (62.3 kg) (12/28 0000)  General: frail, calm elderly woman, cooperative. Moves toes to command. HEENT: mucous membrane moist, no JVD.  NECK: no nuchal rigidity, no JVD  Pulm: clear anteriorly, no accessory muscle use Card: irregular  Irregular, rate controlled, mech S2 is crisp. Abd: non-tender, no organomegally + bowel sounds GU: foley cath to  straight drain EXT: no edema   LAB RESULT BMET    Component Value Date/Time   NA 136 09/17/2011 0700   NA 138 03/14/2010 1318   K 3.9 09/17/2011 0700   K 4.1 03/14/2010 1318   CL  103 09/17/2011 0700   CL 100 03/14/2010 1318   CO2 23 09/17/2011 0700   CO2 27 03/14/2010 1318   GLUCOSE 115* 09/17/2011 0700   GLUCOSE 101 03/14/2010 1318   BUN 18 09/17/2011 0700   BUN 14 03/14/2010 1318   CREATININE 0.78 09/17/2011 0700   CREATININE 0.8 03/14/2010 1318   CALCIUM 10.0 09/17/2011 0700   CALCIUM 9.9 03/14/2010 1318   GFRNONAA 83* 09/17/2011 0700   GFRAA >90 09/17/2011 0700      CBC    Component Value Date/Time   WBC 19.6* 09/17/2011 0444   HGB 11.7* 09/17/2011 0444   HCT 34.5* 09/17/2011 0444   PLT 359 09/17/2011 0444   CXR: 12/27 NSC  Assessment and Plan  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 Also had hx of fall PTA. Perhaps traumatic -Neurology following.  -Heparin resumed 12/27 due to high risk of thrombosis on prosthetic valve. Tolerating thus far  Back/neck pain: Likely due to blood in subarachnoid space -Controlled on PCA  Encephalopathy acute.  -Persists. Likely due to [facility]. Will D/C nonessential meds. Avoid benzo's  Atrial fibrillation with rapid ventricular response.  -rate well controlled now -Cont current Rx.  Hypokalemia Resolved  Hyponatremia: -resolved  Dispo:  -Transfer to Tele and to [facility]. Discussed with Junious Silk, ACNP   Billy Fischer, MD;  PCCM service; Mobile [phone]

## 2011-09-18 NOTE — Progress Notes (Signed)
Pt is successfully transferred to 5530 and resting comfortably in bed.

## 2011-09-18 NOTE — Progress Notes (Signed)
ANTICOAGULATION CONSULT NOTE - Initial Consult  Pharmacy Consult for Heparin Indication: atrial fibrillation, St. Jude MVR  Allergies  Allergen Reactions  . Codeine Nausea And Vomiting    Patient Measurements: Height: 5\' 1"  (154.9 cm) Weight: 137 lb 5.6 oz (62.3 kg) IBW/kg (Calculated) : 47.8   Vital Signs: Temp: 97.8 F (36.6 C) (12/28 1100) Temp src: Oral (12/28 1100) BP: 126/55 mmHg (12/28 1100) Pulse Rate: 76  (12/28 1100)  Labs:  Basename 09/18/11 1215 09/18/11 0317 09/17/11 1958 09/17/11 0700 09/17/11 0444  HGB -- 11.8* -- -- 11.7*  HCT -- 33.0* -- -- 34.5*  PLT -- 296 -- -- 359  APTT -- -- -- -- 34  LABPROT -- -- -- -- 17.4*  INR -- -- -- -- 1.40  HEPARINUNFRC 0.11* <0.10* <0.10* -- --  CREATININE -- -- -- 0.78 --  CKTOTAL -- -- -- -- --  CKMB -- -- -- -- --  TROPONINI -- -- -- -- --   Estimated Creatinine Clearance: 56.2 ml/min (by C-G formula based on Cr of 0.78).  Assessment: 70 year old female with Afib/St. Jude's MVR for anticoagulation. Heparin level 0.11 which is below goal.   Goal of Therapy:  Heparin level 0.3-0.7 units/ml   Plan:  Increase Heparin 1450 unit/hr.  Check heparin level in 8 hours.  Mickeal Skinner 09/18/2011,1:33 PM

## 2011-09-18 NOTE — Progress Notes (Signed)
Patient ID: Carrie [name], female   DOB: [birth year], 70 y.o.   MRN: [id number] Subjective: Patient is sleepy; appears depressed.  Quiet and soft spoken today.  No complaints. Per RN, pt has been waxing and waning since past few days.  She says pt is getting little rest due to multiple family members visiting often. Using minimal fentanyl PCA per RN.   Objective: Vital signs in last 24 hours: Temp:  [97.5 F (36.4 C)-99.7 F (37.6 C)] 97.5 F (36.4 C) (12/28 0745) Pulse Rate:  [64-215] 215  (12/28 0745) Resp:  [18-28] 21  (12/28 0745) BP: (117-158)/(59-90) 145/80 mmHg (12/28 0745) SpO2:  [92 %-100 %] 96 % (12/28 0745) Weight:  [62.3 kg (137 lb 5.6 oz)] 137 lb 5.6 oz (62.3 kg) (12/28 0000) Weight change: 1.5 kg (3 lb 4.9 oz) Last BM Date: 09/18/11  Filed Vitals:   09/18/11 0000 09/18/11 0417 09/18/11 0743 09/18/11 0745  BP: 157/66 134/65  145/80  Pulse: 67 68  215  Temp: 98.8 F (37.1 C) 98.9 F (37.2 C)  97.5 F (36.4 C)  TempSrc: Oral Oral  Oral  Resp: 23 22 22 21   Height:      Weight: 62.3 kg (137 lb 5.6 oz)     SpO2: 100% 98% 97% 96%     Intake/Output from previous day: 12/27 0701 - 12/28 0700 In: 470 [P.O.:420; IV Piggyback:50] Out: 1700 [Urine:1700] Intake/Output this shift: Total I/O In: -  Out: 350 [Urine:350]  Physical Examination:  CV: SYSTOLIC MURMUR AND MECH CLICK.  Mental Status Exam: The patient is alert and cooperative at the time of the examination. The patient is not oriented to place or date.  Motor Exam: The patient moves all 4 extremities; POOR ATTENTION AND EFFORT. The patient moves the lower ext less well.  Deep tendon reflexes: Deep tendon reflexes are symmetric throughout. Toes are downgoing bilaterally.  Cranial Nerve Exam: PUPILS 3-->2.  EOMI.  Facial symmetry is present. Extraocular movements are full. Visual fields are full. Speech is well enunciated, no aphasia or dysarthria is noted.  Lab Results:  [facility] 09/18/11 0317 09/17/11 0444   WBC 18.8* 19.6*  HGB 11.8* 11.7*  HCT 33.0* 34.5*  PLT 296 359   BMET  Basename 09/17/11 0700  NA 136  K 3.9  CL 103  CO2 23  GLUCOSE 115*  BUN 18  CREATININE 0.78  CALCIUM 10.0    Studies/Results: Ct Head Wo Contrast  09/16/2011  *RADIOLOGY REPORT*  Clinical Data: Follow up subarachnoid hemorrhage  CT HEAD WITHOUT CONTRAST  Technique:  Contiguous axial images were obtained from the base of the skull through the vertex without contrast.  Comparison: 09/13/2011  Findings: Near complete resolution of prior left frontal subarachnoid hemorrhage.  No parenchymal hemorrhage or new extra- axial hemorrhage is seen.  No mass lesion, mass effect, or midline shift.  Subcortical white matter and periventricular small vessel ischemic changes.  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  Mild global cortical atrophy.  No ventriculomegaly.  The visualized paranasal sinuses are essentially clear. The mastoid air cells are unopacified.  No evidence of calvarial fracture.  IMPRESSION: No evidence of acute intracranial abnormality.  Near complete resolution of prior left frontal subarachnoid hemorrhage.  Original Report Authenticated By: Charline Bills, M.D.   Dg Chest Port 1 View  09/17/2011  *RADIOLOGY REPORT*  Clinical Data: Respiratory failure, shortness of breath  PORTABLE CHEST - 1 VIEW  Comparison: 09/11/2011; 09/09/2011; 09/08/2011; chest CT - 09/10/2011  Findings:  Grossly unchanged enlarged  cardiac silhouette and mediastinal contours post median sternotomy and valve replacement given decreased lung volumes.  Grossly unchanged positioning of single lead pacemaker with tip overlying the expected location of the right ventricle.  Decreased lung volumes with corresponding interval increase in right perihilar heterogeneous opacities. Cephalization of flow with thickening along the right minor fissure.  No definite pleural effusion or pneumothorax.  Unchanged bones.  IMPRESSION: 1.  Decreased lung volumes  with interval increase in right perihilar opacities to a represent atelectasis. 2.  Query mild pulmonary edema.  Original Report Authenticated By: Waynard Reeds, M.D.    Medications:  Scheduled:    . cefTRIAXone (ROCEPHIN)  IV  2 g Intravenous Q12H  . digoxin  0.125 mg Oral Daily  . famotidine  20 mg Oral BID  . fentaNYL   Intravenous Q4H  . metoprolol tartrate  25 mg Oral BID   Continuous:    . sodium chloride 20 mL (09/15/11 0954)  . heparin 1,250 Units/hr (09/18/11 0715)  . DISCONTD: heparin 800 Units/hr (09/17/11 1250)   UJW:JXBJYN chloride, acetaminophen, diphenhydrAMINE, diphenhydrAMINE, naloxone, ondansetron (ZOFRAN) IV, polyethylene glycol, sodium chloride  Assessment/Plan: 1. 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 Small subarachnoid hemorrhage (suspect post-traumatic vs. septic emboli), now resolved  3. Hypertension  4. Atrial fibrillation  5. Mechanical heart valve  Now on heparin gtt.  Ok to resume coumadin.  Agree with plans to transfer to the floor. Add OT to PT evaluations.   LOS: 12 days   BIBY,SHARON 09/18/2011, 11:00 AM  Dr. Joycelyn Schmid has personally reviewed chart, pertinent data, examined the patient and developed the plan of care.

## 2011-09-19 LAB — COMPREHENSIVE METABOLIC PANEL
AST: 407 U/L — ABNORMAL HIGH (ref 0–37)
Albumin: 2.3 g/dL — ABNORMAL LOW (ref 3.5–5.2)
CO2: 21 mEq/L (ref 19–32)
Calcium: 9.3 mg/dL (ref 8.4–10.5)
Creatinine, Ser: 0.72 mg/dL (ref 0.50–1.10)
GFR calc non Af Amer: 86 mL/min — ABNORMAL LOW (ref >90)
Total Protein: 8.2 g/dL (ref 6.0–8.3)

## 2011-09-19 LAB — CBC
Hemoglobin: 10.9 g/dL — ABNORMAL LOW (ref 12.0–15.0)
MCH: 30.2 pg (ref 26.0–34.0)
MCHC: 34.4 g/dL (ref 30.0–36.0)
MCHC: 35.6 g/dL (ref 30.0–36.0)
MCV: 84.9 fL (ref 78.0–100.0)
Platelets: 257 10*3/uL (ref 150–400)
RBC: 3.69 MIL/uL — ABNORMAL LOW (ref 3.87–5.11)
RDW: 14.8 % (ref 11.5–15.5)

## 2011-09-19 LAB — HEPARIN LEVEL (UNFRACTIONATED): Heparin Unfractionated: 0.19 IU/mL — ABNORMAL LOW (ref 0.30–0.70)

## 2011-09-19 LAB — PROTIME-INR: Prothrombin Time: 17 seconds — ABNORMAL HIGH (ref 11.6–15.2)

## 2011-09-19 MED ORDER — POTASSIUM CHLORIDE 10 MEQ/100ML IV SOLN
10.0000 meq | INTRAVENOUS | Status: AC
  Administered 2011-09-19 – 2011-09-20 (×3): 10 meq via INTRAVENOUS
  Filled 2011-09-19 (×3): qty 100

## 2011-09-19 MED ORDER — SODIUM CHLORIDE 0.9 % IV SOLN
100.0000 mg | Freq: Two times a day (BID) | INTRAVENOUS | Status: DC
  Administered 2011-09-19 – 2011-09-22 (×8): 100 mg via INTRAVENOUS
  Filled 2011-09-19 (×16): qty 10

## 2011-09-19 MED ORDER — HYDROMORPHONE 0.3 MG/ML IV SOLN
INTRAVENOUS | Status: DC
  Administered 2011-09-19: 04:00:00 via INTRAVENOUS
  Administered 2011-09-20: 0.2 mg via INTRAVENOUS
  Administered 2011-09-20: 1 mg via INTRAVENOUS
  Administered 2011-09-21: 0.2 mg via INTRAVENOUS
  Administered 2011-09-21: 0.1 mg via INTRAVENOUS
  Filled 2011-09-19 (×2): qty 25

## 2011-09-19 MED ORDER — WARFARIN SODIUM 7.5 MG PO TABS
7.5000 mg | ORAL_TABLET | Freq: Once | ORAL | Status: AC
  Administered 2011-09-19: 7.5 mg via ORAL
  Filled 2011-09-19: qty 1

## 2011-09-19 NOTE — Progress Notes (Addendum)
Subjective: No chest pain. Intermittent delerium - encephalopathy vs ? Seizure per Neurology  Objective: Vital signs in last 24 hours: Temp:  [96.6 F (35.9 C)-98.5 F (36.9 C)] 96.6 F (35.9 C) (12/29 1016) Pulse Rate:  [64-78] 77  (12/29 1016) Resp:  [14-27] 18  (12/29 1016) BP: (117-158)/(59-94) 153/84 mmHg (12/29 1018) SpO2:  [94 %-99 %] 99 % (12/29 1016) Weight:  [73.8 kg (162 lb 11.2 oz)] 162 lb 11.2 oz (73.8 kg) (12/29 0520) Weight change: 11.5 kg (25 lb 5.6 oz) Last BM Date: 09/18/11 Intake/Output from previous day:-1230 12/28 0701 - 12/29 0700 In: 333.5 [P.O.:240; I.V.:93.5] Out: 900 [Urine:900] Intake/Output this shift:    PE: General: A&O X 3, pleasant affect. Is a bit confused - mild encephalopathy. Heart:S1S2 irreg, irreg.  Soft HSM ~1/6. Sharp valve clicks.  Lungs:Clear without rales, rhonchi. Normal Effort. Abd:+BS, soft, non-tender. Ext:No clubbing/cyanosis or edema.  2+ pulses bilaterally   Lab Results:  Basename 09/19/11 0610 09/18/11 0317  WBC 17.7* 18.8*  HGB 10.9* 11.8*  HCT 31.7* 33.0*  PLT 270 296   BMET  Basename 09/17/11 0700  NA 136  K 3.9  CL 103  CO2 23  GLUCOSE 115*  BUN 18  CREATININE 0.78  CALCIUM 10.0   No results found for this basename: TROPONINI:2,CK,MB:2 in the last 72 hours  No results found for this basename: CHOL,  HDL,  LDLCALC,  LDLDIRECT,  TRIG,  CHOLHDL   No results found for this basename: HGBA1C     Lab Results  Component Value Date   TSH 0.453 09/06/2011    EKG: Orders placed during the hospital encounter of 09/06/11  . EKG    Studies/Results: No results found.  Medications: I have reviewed the patient's current medications.  Assessment/Plan: Patient Active Problem List  Diagnoses  . CONSTIPATION  . Encephalopathy acute  . Atrial fibrillation with rapid ventricular response  . S/P mitral valve replacement, [employer]  . Pneumococcal septicemia  . Subarachnoid hemorrhage on CT this adm  . Muscle  weakness of lower extremity  . Presence of permanent cardiac pacemaker  . Chronic anticoagulation, ( INR goal 2.5-3.5 under normal circumstances for [employer] MVR)  . 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   LOS: 13 days    Plan:  1. Mechanical mitral valve prosthesis. Restarted IV Heparin yesterday with no acute issues.  -- Would restart Warfarin @ home dose today.  Neurology is in agreement.  See Dr. Tessie Fass note for full explanation. 2. Permanent atrial fibrillation satisfactory rate control with PPM.  Also indication for anticoagulation. 3. Prosthetic MV endocarditis -- Continues to be afebrile with WBC continuing to decline.    A repeat TEE is indicated sometime next week - will schedule for either Wed or Thurs (pending MD availability - Dr. Royann Shivers or [facility])  Plan for now is to complete high dose Abx through next week then decrease Rocephin to 2gm daily until end of January (to complete ~6weeks of couverage).    Would then want to to a re-look TTE after completing this course -- to determine need for chronic suppressive Abx vs considering re-do MVR -- will need to involve Dr. Tyrone Sage in this decision. 4. Subarachnoid hemorrhage virtually completely resolved by CT of the head.  Monitor while re-instituting anticoagulation. 5. Encephalopathy with Delerium -- per Neurology 6. Will f/u with Dr. Clarene Duke after discharge.  Will check her chart tomorrow, but will f/u on Monday.  Carrie [name], M.D., M.S. [facility] [address] [place], [place]  [address]  [phone]  09/19/2011 1:44 PM

## 2011-09-19 NOTE — Progress Notes (Signed)
Subjective: Pt alone in room.  Lying in bed and appears comfortable.  Smiles and occasionally offers simple appropriate answers to pointed questions.    Objective: BP 153/84  Pulse 77  Temp(Src) 96.6 F (35.9 C) (Axillary)  Resp 18  Ht 5\' 1"  (1.549 m)  Wt 73.8 kg (162 lb 11.2 oz)  BMI 30.74 kg/m2  SpO2 99%  Intake/Output from previous day: 12/28 0701 - 12/29 0700 In: 333.5 [P.O.:240; I.V.:93.5] Out: 900 [Urine:900]  Medications:  Scheduled:   . cefTRIAXone (ROCEPHIN)  IV  2 g Intravenous Q12H  . digoxin  0.125 mg Oral Daily  . famotidine  20 mg Oral BID  . HYDROmorphone PCA 0.3 mg/mL   Intravenous Q4H  . metoprolol tartrate  25 mg Oral BID  . DISCONTD: fentaNYL   Intravenous Q4H  IV heparin per pharmacy.  Neurologic Exam: Mental Status: Alert. No dysarthria.  Minimal verbalizations are occasionally appropriate.  Tends to perseverate. Has difficulty following multi-step commands.  Overall poor attention and cooperation with exam. Cranial Nerves: II- blinks to confrontation. III/IV/VI-Extraocular movements intact.  Pupils reactive bilaterally. V/VII-Smile symmetric VIII-hearing grossly intact XI-bilateral shoulder shrug XII-midline tongue extension Motor: grips appear intact, though difficult to assess due to lack of ability to follow commands.  Appears to be able to move upper extremities purposefully.   Does not seem able to move lower extremities well.   Sensory: responds to noxious stim on left, seems aware of light touch on the right. Deep Tendon Reflexes: 2+ and symmetric throughout Plantars: Downgoing bilaterally  Lab Results: HEPARIN LEVEL (UNFRACTIONATED)     Status: Abnormal   09/19/11 12:00 AM   Heparin Unfractionated 0.19 (*) 0.30 - 0.70 (IU/mL)   CBC     Status: Abnormal   09/19/11  6:10 AM   WBC 17.7 (*) 4.0 - 10.5 (K/uL)    RBC 3.69 (*) 3.87 - 5.11 (MIL/uL)    Hemoglobin 10.9 (*) 12.0 - 15.0 (g/dL)    HCT 16.1 (*) 09.6 - 46.0 (%)    MCV 85.9  78.0 -  100.0 (fL)    MCH 29.5  26.0 - 34.0 (pg)    MCHC 34.4  30.0 - 36.0 (g/dL)    RDW 04.5  40.9 - 81.1 (%)    Platelets 270  150 - 400 (K/uL)     Studies/Results: No results found.    Assessment/Plan: 1. 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    -Cardiology/TCTS following - repeat TEE planned for Wed 09/22/2010.  Dr. Herbie Baltimore indicated that a protracted febrile illness may persist for a couple of weeks even in the presence of appropriate antibiotic therapy. If fever is still present more than 2 weeks following initiation of antibiotic therapy there is unfortunately a high likelihood that we will be unable to sterilize the bloodstream/ mechanical valve. After 6 weeks abx completed, would rec.  re-look TTE to determine need for chronic suppressive Abx vs considering re-do MVR -- will need to involve Dr. Tyrone Sage in this decision.  2. Small subarachnoid hemorrhage (suspect post-traumatic vs. septic emboli), now resolved   3. Hypertension   4. Atrial fibrillation; rate controlled.  Warfarin indicated.  5. Mechanical heart valve; pharmacy managing anticoagulation.   1.  Restarted IV Heparin 12/27 with no acute issues. Cards agrees with starting warfarin and  feels the risk of embolic complications or catastrophic valve thrombosis seems higher than worsening of intracranial hemorrhage. See Dr. Tessie Fass note for full explanation.     LOS: 13 days   Marya Fossa PA-C [facility] [phone] 09/19/2011  12:56 PM

## 2011-09-19 NOTE — Progress Notes (Signed)
Patient ID: Carrie [name], female   DOB: 06-02-1941, 70 y.o.   MRN: [id number] Subjective: Patient transferred from [facility] and seen today. Patient confused.Not verbalising complaints.  Objective: Weight change: 11.5 kg (25 lb 5.6 oz)  Intake/Output Summary (Last 24 hours) at 09/19/11 1602 Last data filed at 09/19/11 0521  Gross per 24 hour  Intake   93.5 ml  Output    450 ml  Net -356.5 ml   BP 134/67  Pulse 69  Temp(Src) 97.6 F (36.4 C) (Oral)  Resp 18  Ht 5\' 1"  (1.549 m)  Wt 73.8 kg (162 lb 11.2 oz)  BMI 30.74 kg/m2  SpO2 96% Physical Exam: General appearance: alert,not cooperative and no distress,pallor Head: Normocephalic, without obvious abnormality, atraumatic Neck: no adenopathy, no carotid bruit, no JVD, supple, symmetrical, trachea midline and thyroid not enlarged, symmetric, no tenderness/mass/nodules Lungs: clear to auscultation bilaterally Heart: irregularly irregular Abdomen: soft, non-tender; bowel sounds normal; no masses,  no organomegaly Extremities: extremities normal, atraumatic, no cyanosis or edema Skin: Skin color, texture, turgor normal. No rashes or lesions Neurology-confused,no lateralizing signs  Lab Results: Results for orders placed during the hospital encounter of 09/06/11 (from the past 48 hour(s))  HEPARIN LEVEL (UNFRACTIONATED)     Status: Abnormal   Collection Time   09/17/11  7:58 PM      Component Value Range Comment   Heparin Unfractionated <0.10 (*) 0.30 - 0.70 (IU/mL)   HEPARIN LEVEL (UNFRACTIONATED)     Status: Abnormal   Collection Time   09/18/11  3:17 AM      Component Value Range Comment   Heparin Unfractionated <0.10 (*) 0.30 - 0.70 (IU/mL)   CBC     Status: Abnormal   Collection Time   09/18/11  3:17 AM      Component Value Range Comment   WBC 18.8 (*) 4.0 - 10.5 (K/uL)    RBC 3.88  3.87 - 5.11 (MIL/uL)    Hemoglobin 11.8 (*) 12.0 - 15.0 (g/dL)    HCT 40.9 (*) 81.1 - 46.0 (%)    MCV 85.1  78.0 - 100.0 (fL)    MCH 30.4   26.0 - 34.0 (pg)    MCHC 35.8  30.0 - 36.0 (g/dL)    RDW 91.4  78.2 - 95.6 (%)    Platelets 296  150 - 400 (K/uL)   HEPARIN LEVEL (UNFRACTIONATED)     Status: Abnormal   Collection Time   09/18/11 12:15 PM      Component Value Range Comment   Heparin Unfractionated 0.11 (*) 0.30 - 0.70 (IU/mL)   HEPARIN LEVEL (UNFRACTIONATED)     Status: Abnormal   Collection Time   09/19/11 12:00 AM      Component Value Range Comment   Heparin Unfractionated 0.19 (*) 0.30 - 0.70 (IU/mL)   CBC     Status: Abnormal   Collection Time   09/19/11  6:10 AM      Component Value Range Comment   WBC 17.7 (*) 4.0 - 10.5 (K/uL)    RBC 3.69 (*) 3.87 - 5.11 (MIL/uL)    Hemoglobin 10.9 (*) 12.0 - 15.0 (g/dL)    HCT 21.3 (*) 08.6 - 46.0 (%)    MCV 85.9  78.0 - 100.0 (fL)    MCH 29.5  26.0 - 34.0 (pg)    MCHC 34.4  30.0 - 36.0 (g/dL)    RDW 57.8  46.9 - 62.9 (%)    Platelets 270  150 - 400 (K/uL)  Micro Results: Recent Results (from the past 240 hour(s))  MRSA PCR SCREENING     Status: Normal   Collection Time   09/09/11  5:56 PM      Component Value Range Status Comment   MRSA by PCR NEGATIVE  NEGATIVE  Final     Studies/Results: Ct Angio Head W/cm &/or Wo Cm  09/08/2011  *RADIOLOGY REPORT*  Clinical Data:  2-week history of fever and malaise.  Possible right common carotid artery dissections seen on CT cervical spine.  CT ANGIOGRAPHY HEAD AND NECK  Technique:  Multidetector CT imaging of the head and neck was performed using the standard protocol during bolus administration of intravenous contrast.  Multiplanar CT image reconstructions including MIPs were obtained to evaluate the vascular anatomy. Carotid stenosis measurements (when applicable) are obtained utilizing NASCET criteria, using the distal internal carotid diameter as the denominator.  Contrast: OMNIPAQUE IOHEXOL 350 MG/ML IV SOLN an  Comparison:  CT head and C-spine earlier today.  CTA NECK  Findings:  There is reflux of contrast into  the left internal jugular vein.  This may be related to the left sided pacemaker placement.  There is fair opacification of the craniocerebral vasculature.  Atheromatous change can be seen in the transverse arch.  There is considerable Hounsfield artifact from the pacemaker battery pack. There is no visible proximal great vessel dissection or stenosis.  Both carotid bifurcations are widely patent.  Mild nonstenotic calcific atheromatous change can be seen on the right.  Cervical internal carotid arteries tortuous but patent without dissection. Both vertebrals are patent and equal in size through the neck. Possible 15 x 16 mm right thyroid nodule.  Consider thyroid ultrasound for further evaluation.  Lung apices clear.   Review of the MIP images confirms the above findings.  IMPRESSION: No visible proximal great vessel dissection within limits of detection by artifact from pacemaker battery pack.  No evidence for flow reducing lesion in the right or left carotid system.  CTA HEAD  Findings:  Calcific nonstenotic atheromatous change of the skull base, cavernous, and supraclinoid internal carotid arteries.  No visible proximal stenosis of the anterior, middle, or posterior cerebral arteries.  Basilar artery patent without focal stenosis. No visible intracranial aneurysm.  Post infusion imaging of the head reveals no abnormal enhancement.  Chronic left basal ganglia infarct redemonstrated.  Left frontal subarachnoid blood appears slightly greater than was seen earlier. It is possible a component of this increased conspicuity relates to enhancement postcontrast. Recommend repeat noncontrast examination for continued surveillance.   Review of the MIP images confirms the above findings.  IMPRESSION: No proximal carotid or basilar stenosis.  No visible intracranial dissection.  Suspect slight increase in left frontal subarachnoid blood. I cannot completely exclude that this increased prominence relates to a component of  enhancement Recommend repeat noncontrast imaging in 24-48 hours.  Original Report Authenticated By: Elsie Stain, M.D.   Dg Chest 2 View  09/06/2011  *RADIOLOGY REPORT*  Clinical Data: Rule out infiltrate. Confusion.  CHEST - 2 VIEW  Comparison: 02/20/2008  Findings:  Heart size appears mildly enlarged.  Prior median sternotomy CABG procedure.  Left chest wall pacer device is noted with lead in the right ventricle.  There are no pleural effusions or interstitial edema.  Chronic bronchitic changes are noted bilaterally.  IMPRESSION:  1.  Cardiac enlargement. 2.  No acute changes.  Original Report Authenticated By: Rosealee Albee, M.D.   Ct Head Wo Contrast  09/16/2011  *RADIOLOGY REPORT*  Clinical Data: Follow up subarachnoid hemorrhage  CT HEAD WITHOUT CONTRAST  Technique:  Contiguous axial images were obtained from the base of the skull through the vertex without contrast.  Comparison: 09/13/2011  Findings: Near complete resolution of prior left frontal subarachnoid hemorrhage.  No parenchymal hemorrhage or new extra- axial hemorrhage is seen.  No mass lesion, mass effect, or midline shift.  Subcortical white matter and periventricular small vessel ischemic changes.  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  Mild global cortical atrophy.  No ventriculomegaly.  The visualized paranasal sinuses are essentially clear. The mastoid air cells are unopacified.  No evidence of calvarial fracture.  IMPRESSION: No evidence of acute intracranial abnormality.  Near complete resolution of prior left frontal subarachnoid hemorrhage.  Original Report Authenticated By: Charline Bills, M.D.   Ct Head Wo Contrast  09/13/2011  *RADIOLOGY REPORT*  Clinical Data: New onset headache and neck soreness.  CT HEAD WITHOUT CONTRAST  Technique:  Contiguous axial images were obtained from the base of the skull through the vertex without contrast.  Comparison: Head CT scans 09/10/2011 and 09/09/2011.  Findings: Small amount of  subarachnoid hemorrhage in the left frontal lobe continues to resolve.  No new hemorrhage is identified.  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  No midline shift, hydrocephalus or abnormal extra-axial fluid collection is identified.  IMPRESSION:  1.  No acute finding. 2.  Resolving subarachnoid hemorrhage on the left.  Original Report Authenticated By: Bernadene Bell. Maricela Curet, M.D.   Ct Head Wo Contrast  09/10/2011  *RADIOLOGY REPORT*  Clinical Data: Follow up subarachnoid hemorrhage  CT HEAD WITHOUT CONTRAST  Technique:  Contiguous axial images were obtained from the base of the skull through the vertex without contrast.  Comparison: 09/09/2011  Findings: Hyperdense subarachnoid hemorrhage over the convexity left frontal region has improved.  No new areas of hemorrhage are identified.  No subdural hemorrhage is present.  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  No acute infarct.  Negative for mass lesion.  IMPRESSION: Small areas of subarachnoid hemorrhage over the convexity on the left have improved.  No new area of hemorrhage or infarction.  Original Report Authenticated By: Camelia Phenes, M.D.   Ct Head Wo Contrast  09/09/2011  *RADIOLOGY REPORT*  Clinical Data: Followup subarachnoid hemorrhage.  CT HEAD WITHOUT CONTRAST  Technique:  Contiguous axial images were obtained from the base of the skull through the vertex without contrast.  Comparison: 09/07/2011 CT and 11/10/2005 MR.  Findings: Compared to the 09/07/2011 noncontrast CT examination. There has been an increase in amount of subarachnoid blood in the left frontal region.  Etiology of this is indeterminate.  Result of venous thrombosis / infarct not excluded.  Nonspecific white matter type changes most notable left frontal region.  Remote left caudate head infarct with encephalomalacia. This is new compared to 2007 MR. No CT evidence of large acute infarct.  Small acute infarct cannot be excluded by CT.  No  intracranial mass lesion detected on this unenhanced exam.  IMPRESSION: Compared to the 09/07/2011 noncontrast CT examination.  There has been an increase in amount of subarachnoid blood in the left frontal region.  Etiology of this is indeterminate. Please see above.  Original Report Authenticated By: Fuller Canada, M.D.   Ct Head Wo Contrast  09/07/2011  *RADIOLOGY REPORT*  Clinical Data: Headaches  CT HEAD WITHOUT CONTRAST  Technique:  Contiguous axial images were obtained from the base of the skull through the vertex without contrast.  Comparison: [facility] MRI brain dated 11/10/2005  Findings: Tiny hyperdense foci overlying the left frontal lobe (series 2/images 14 and 22) and left medial frontal lobe (series 2/image 19), possibly reflecting subarachnoid hemorrhage.  No mass lesion, mass effect, or midline shift.  No CT evidence of acute infarction.  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  Subcortical white matter and periventricular small vessel ischemic changes.  Mildly age related atrophy.  No ventriculomegaly.  The visualized paranasal sinuses are essentially clear. The mastoid air cells are unopacified.  No evidence of calvarial fracture.  IMPRESSION: Possible tiny foci of subarachnoid hemorrhage overlying the left frontal lobe, as described above.  Old lacunar infarct with small vessel ischemic changes  Critical Value/emergent results were called by telephone at the time of interpretation on 09/07/2011  at 1810 hours  to  Dr Delford Field, who verbally acknowledged these results.  Original Report Authenticated By: Charline Bills, M.D.   Ct Angio Neck W/cm &/or Wo/cm  09/08/2011  *RADIOLOGY REPORT*  Clinical Data:  2-week history of fever and malaise.  Possible right common carotid artery dissections seen on CT cervical spine.  CT ANGIOGRAPHY HEAD AND NECK  Technique:  Multidetector CT imaging of the head and neck was performed using the standard protocol during bolus administration of intravenous  contrast.  Multiplanar CT image reconstructions including MIPs were obtained to evaluate the vascular anatomy. Carotid stenosis measurements (when applicable) are obtained utilizing NASCET criteria, using the distal internal carotid diameter as the denominator.  Contrast: OMNIPAQUE IOHEXOL 350 MG/ML IV SOLN an  Comparison:  CT head and C-spine earlier today.  CTA NECK  Findings:  There is reflux of contrast into the left internal jugular vein.  This may be related to the left sided pacemaker placement.  There is fair opacification of the craniocerebral vasculature.  Atheromatous change can be seen in the transverse arch.  There is considerable Hounsfield artifact from the pacemaker battery pack. There is no visible proximal great vessel dissection or stenosis.  Both carotid bifurcations are widely patent.  Mild nonstenotic calcific atheromatous change can be seen on the right.  Cervical internal carotid arteries tortuous but patent without dissection. Both vertebrals are patent and equal in size through the neck. Possible 15 x 16 mm right thyroid nodule.  Consider thyroid ultrasound for further evaluation.  Lung apices clear.   Review of the MIP images confirms the above findings.  IMPRESSION: No visible proximal great vessel dissection within limits of detection by artifact from pacemaker battery pack.  No evidence for flow reducing lesion in the right or left carotid system.  CTA HEAD  Findings:  Calcific nonstenotic atheromatous change of the skull base, cavernous, and supraclinoid internal carotid arteries.  No visible proximal stenosis of the anterior, middle, or posterior cerebral arteries.  Basilar artery patent without focal stenosis. No visible intracranial aneurysm.  Post infusion imaging of the head reveals no abnormal enhancement.  Chronic left basal ganglia infarct redemonstrated.  Left frontal subarachnoid blood appears slightly greater than was seen earlier. It is possible a component of this  increased conspicuity relates to enhancement postcontrast. Recommend repeat noncontrast examination for continued surveillance.   Review of the MIP images confirms the above findings.  IMPRESSION: No proximal carotid or basilar stenosis.  No visible intracranial dissection.  Suspect slight increase in left frontal subarachnoid blood. I cannot completely exclude that this increased prominence relates to a component of enhancement Recommend repeat noncontrast imaging in 24-48 hours.  Original Report Authenticated By: Jackquline Denmark.  CURNES, M.D.   Ct Cervical Spine W Contrast  09/07/2011  *RADIOLOGY REPORT*  Clinical Data: C-spine tenderness, evaluate for fracture or abscess  CT CERVICAL SPINE WITH CONTRAST  Technique:  Multidetector CT imaging of the cervical spine was performed during intravenous contrast administration. Multiplanar CT image reconstructions were also generated.  Contrast: OMNIPAQUE IOHEXOL 300 MG/ML IV SOLN  Comparison: None.  Findings: Normal cervical lordosis.  No evidence of fracture or dislocation.  Vertebral body heights are maintained.  The dens appears intact.  No prevertebral soft tissue swelling.  Mild multilevel degenerative changes.  No epidural abscess/fluid collection is seen.  There is a possible linear filling defect within the right common carotid artery (series 606/image 69, series 605/image 4), and although this may reflect artifact at the thoracic inlet, a focal dissection cannot be excluded.  IMPRESSION: Apparent linear filling defect within the right common carotid artery, possibly artifactual, although focal dissection cannot be excluded.  Consider CTA/MRA neck for further characterization as clinically warranted.  No evidence of fracture or dislocation.  No epidural abscess/fluid collection is seen.  Critical Value/emergent results were called by telephone at the time of interpretation on 09/07/2011  at 1810 hours  to  Dr Delford Field, who verbally acknowledged these results.   Original Report Authenticated By: Charline Bills, M.D.   Ct Thoracic Spine Wo Contrast  09/10/2011  *RADIOLOGY REPORT*  Clinical Data:  Leg weakness  CT THORACIC AND LUMBAR SPINE WITHOUT CONTRAST  Technique:  Multidetector CT imaging of the thoracic and lumbar spine was performed without contrast. Multiplanar CT image reconstructions were also generated.  Comparison:  None  CT THORACIC SPINE  Findings:  No fracture or dislocation is seen.  Mild multilevel degenerative changes of the thoracic spine, most prominent at T11-12.  Lower cervical spine is incompletely imaged.  Left paracentral disc herniation at C5-6 (series 9/image 14).  Visualized thyroid is unremarkable.  Cardiomegaly.  Coronary atherosclerosis.  Valve annuloplasty.  Atherosclerotic calcifications of the aortic arch.  Pacemaker leads.  Evaluation of the lung parenchyma is constrained by respiratory motion.  Scattered patchy left upper lobe opacities. Mild right middle lobe opacity / scarring.  Small bilateral pleural effusions with associated lower lobe atelectasis.  IMPRESSION: No fracture or dislocation is seen.  Mild multilevel degenerative changes, most prominent at T11-12.  Left paracentral disc herniation at C5-6.  Evaluation of the lung parenchyma is constrained by respiratory motion.  Patchy left upper lobe and right middle lobe opacities. Small bilateral pleural effusions with associated lower lobe atelectasis.  CT LUMBAR SPINE  Findings: No fracture or dislocation is seen.  Very mild multilevel degenerative changes.  Atherosclerotic calcifications of the abdominal aorta and branch vessels.  Suspected calcified uterine fibroids.  IMPRESSION: No fracture or dislocation is seen.  Very mild multilevel degenerative changes.  Original Report Authenticated By: Charline Bills, M.D.   Ct Lumbar Spine Wo Contrast  09/10/2011  *RADIOLOGY REPORT*  Clinical Data:  Leg weakness  CT THORACIC AND LUMBAR SPINE WITHOUT CONTRAST  Technique:   Multidetector CT imaging of the thoracic and lumbar spine was performed without contrast. Multiplanar CT image reconstructions were also generated.  Comparison:  None  CT THORACIC SPINE  Findings:  No fracture or dislocation is seen.  Mild multilevel degenerative changes of the thoracic spine, most prominent at T11-12.  Lower cervical spine is incompletely imaged.  Left paracentral disc herniation at C5-6 (series 9/image 14).  Visualized thyroid is unremarkable.  Cardiomegaly.  Coronary atherosclerosis.  Valve annuloplasty.  Atherosclerotic calcifications  of the aortic arch.  Pacemaker leads.  Evaluation of the lung parenchyma is constrained by respiratory motion.  Scattered patchy left upper lobe opacities. Mild right middle lobe opacity / scarring.  Small bilateral pleural effusions with associated lower lobe atelectasis.  IMPRESSION: No fracture or dislocation is seen.  Mild multilevel degenerative changes, most prominent at T11-12.  Left paracentral disc herniation at C5-6.  Evaluation of the lung parenchyma is constrained by respiratory motion.  Patchy left upper lobe and right middle lobe opacities. Small bilateral pleural effusions with associated lower lobe atelectasis.  CT LUMBAR SPINE  Findings: No fracture or dislocation is seen.  Very mild multilevel degenerative changes.  Atherosclerotic calcifications of the abdominal aorta and branch vessels.  Suspected calcified uterine fibroids.  IMPRESSION: No fracture or dislocation is seen.  Very mild multilevel degenerative changes.  Original Report Authenticated By: Charline Bills, M.D.   Dg Chest Port 1 View  09/17/2011  *RADIOLOGY REPORT*  Clinical Data: Respiratory failure, shortness of breath  PORTABLE CHEST - 1 VIEW  Comparison: 09/11/2011; 09/09/2011; 09/08/2011; chest CT - 09/10/2011  Findings:  Grossly unchanged enlarged cardiac silhouette and mediastinal contours post median sternotomy and valve replacement given decreased lung volumes.  Grossly  unchanged positioning of single lead pacemaker with tip overlying the expected location of the right ventricle.  Decreased lung volumes with corresponding interval increase in right perihilar heterogeneous opacities. Cephalization of flow with thickening along the right minor fissure.  No definite pleural effusion or pneumothorax.  Unchanged bones.  IMPRESSION: 1.  Decreased lung volumes with interval increase in right perihilar opacities to a represent atelectasis. 2.  Query mild pulmonary edema.  Original Report Authenticated By: Waynard Reeds, M.D.   Dg Chest Port 1 View  09/11/2011  *RADIOLOGY REPORT*  Clinical Data: Respiratory failure  PORTABLE CHEST - 1 VIEW  Comparison: 09/09/2011  Findings: Heart is moderately enlarged.  Single lead left subclavian pacemaker device unchanged.  Low volumes and hazy airspace opacities improved.  Stable mid lung right-sided linear opacity.  Normal pulmonary vascularity.  IMPRESSION: Improved aeration and airspace opacities.  Original Report Authenticated By: Donavan Burnet, M.D.   Dg Chest Port 1 View  09/09/2011  *RADIOLOGY REPORT*  Clinical Data: No edema, evaluate endotracheal tube position  PORTABLE CHEST - 1 VIEW  Comparison: Portable chest x-ray of 09/08/2011  Findings: No endotracheal tube is visible.  There is little change in poor aeration and basilar volume loss.  Cardiomegaly is stable. 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  IMPRESSION: No endotracheal tube.  No change in poor aeration with basilar volume loss.  Original Report Authenticated By: Juline Patch, M.D.   Dg Chest Port 1 View  09/08/2011  *RADIOLOGY REPORT*  Clinical Data: Respiratory distress.  PORTABLE CHEST - 1 VIEW  Comparison: 09/06/2011  Findings: Cardiomegaly.  Mild vascular congestion.  Interstitial prominence could reflect interstitial edema.  This is increased slightly since prior study.  No effusions.  No acute bony abnormality.  IMPRESSION: Increasing interstitial  prominence, question interstitial edema.  Original Report Authenticated By: Cyndie Chime, M.D.   Medications: Scheduled Meds:   . cefTRIAXone (ROCEPHIN)  IV  2 g Intravenous Q12H  . digoxin  0.125 mg Oral Daily  . famotidine  20 mg Oral BID  . HYDROmorphone PCA 0.3 mg/mL   Intravenous Q4H  . lacosamide (VIMPAT) IV  100 mg Intravenous Q12H  . metoprolol tartrate  25 mg Oral BID  . DISCONTD: fentaNYL  Intravenous Q4H   Continuous Infusions:   . sodium chloride 20 mL/hr at 09/19/11 4098  . heparin 1,700 Units/hr (09/19/11 1042)   PRN Meds:.sodium chloride, acetaminophen, diphenhydrAMINE, diphenhydrAMINE, naloxone, ondansetron (ZOFRAN) IV, polyethylene glycol, sodium chloride  Assessment/Plan: #1 Encephalopathy acute-most likely secondary to [facility] #2 Atrial fibrillation with rapid ventricular response-ventricular rate is controlled for now #3 S/P mitral valve replacement, [facility]-will continue iv heparin and add coumadin #4 Pneumococcal septicemia-will continue rocephin #5 Subarachnoid hemorrhage-no surgical intervention #6 Presence of permanent cardiac pacemaker-will continue anti-coagulation with heparin #7 Chronic anticoagulation, ( INR goal 2.5-3.5 under normal circumstances for [facility] MVR)  #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

## 2011-09-19 NOTE — Progress Notes (Addendum)
ANTICOAGULATION CONSULT NOTE - Follow Up Consult  Pharmacy Consult for Heparin/Coumadin  Indication: atrial fibrillation and St. Jude MVR  Allergies  Allergen Reactions  . Codeine Nausea And Vomiting    Patient Measurements: Height: 5\' 1"  (154.9 cm) Weight: 162 lb 11.2 oz (73.8 kg) IBW/kg (Calculated) : 47.8  Adjusted Body Weight: 60  Vital Signs: Temp: 98.4 F (36.9 C) (12/29 1813) Temp src: Oral (12/29 1813) BP: 137/75 mmHg (12/29 1813) Pulse Rate: 75  (12/29 1813)  Labs:  Basename 09/19/11 1614 09/19/11 0610 09/19/11 09/18/11 1215 09/18/11 0317 09/17/11 0700 09/17/11 0444  HGB -- 10.9* -- -- 11.8* -- --  HCT -- 31.7* -- -- 33.0* -- 34.5*  PLT -- 270 -- -- 296 -- 359  APTT -- -- -- -- -- -- 34  LABPROT 17.0* -- -- -- -- -- 17.4*  INR 1.36 -- -- -- -- -- 1.40  HEPARINUNFRC -- -- 0.19* 0.11* <0.10* -- --  CREATININE -- -- -- -- -- 0.78 --  CKTOTAL -- -- -- -- -- -- --  CKMB -- -- -- -- -- -- --  TROPONINI -- -- -- -- -- -- --   Estimated Creatinine Clearance: 61 ml/min (by C-G formula based on Cr of 0.78).   Medications:  Infusions:     . sodium chloride 20 mL/hr at 09/19/11 0921  . heparin 1,700 Units/hr (09/19/11 1042)    Heparin/Coumadin: INR 1.36 for atrial fibrillation with rapid ventricular response and pacemaker.S/P mitral valve replacement, St Jude-will continue iv heparin and add coumadin. Has been violent and refusing heparin levels. PM heparin level finally obtained within goal 0.59  Pneumococcal septicemia/MV endocarditis:will continue rocephin x 6 weeks.  Subarachnoid hemorrhage-no surgical intervention  Plan: Coumadin 7.5mg  tonight. Daily INR. Continue heparin at 1700 units/hr and check next level with am labs.   Merilynn Finland, [employer] 09/19/2011,6:51 PM

## 2011-09-19 NOTE — Progress Notes (Signed)
Patient ID: Carrie [name], female   DOB: [birth year], 70 y.o.   MRN: [id number] Patient ID: Carrie [name], female   DOB: May 30, 1941, 70 y.o.   MRN: [id number] Subjective: Multiple family members at bedside, she could not recognize them. Delirium, laughing, not answering questions, there are also episodes of eyes closing, bilateral hands tremorish movemet,  Objective: Vital signs in last 24 hours: Temp:  [96.6 F (35.9 C)-98.5 F (36.9 C)] 96.6 F (35.9 C) (12/29 1016) Pulse Rate:  [64-78] 77  (12/29 1016) Resp:  [14-27] 18  (12/29 1016) BP: (117-158)/(59-94) 153/84 mmHg (12/29 1018) SpO2:  [94 %-99 %] 99 % (12/29 1016) Weight:  [73.8 kg (162 lb 11.2 oz)] 162 lb 11.2 oz (73.8 kg) (12/29 0520) Weight change: 11.5 kg (25 lb 5.6 oz) Last BM Date: 09/18/11  Filed Vitals:   09/19/11 0620 09/19/11 0720 09/19/11 1016 09/19/11 1018  BP: 134/68 136/70 158/90 153/84  Pulse: 74 78 77   Temp: 98.2 F (36.8 C) 97.9 F (36.6 C) 96.6 F (35.9 C)   TempSrc: Oral Oral Axillary   Resp: 22 20 18    Height:      Weight:      SpO2: 98% 98% 99%      Intake/Output from previous day: 12/28 0701 - 12/29 0700 In: 333.5 [P.O.:240; I.V.:93.5] Out: 900 [Urine:900] Intake/Output this shift:    Physical Examination:  CV: SYSTOLIC MURMUR AND MECH CLICK.  Complains of neck pain when move passively Mental Status Exam: laughing unappropriately, not answering question.   Cranial Nerve Exam: PUPILS equal reactive.  EOMI.  Facial symmetry is present. Extraocular movements are full. Visual fields are full.  Motor Exam: The patient moves all 4 extremities  Deep tendon reflexes: Deep tendon reflexes are symmetric throughout. Toes are downgoing bilaterally.   Lab Results:  [facility] 09/19/11 0610 09/18/11 0317  WBC 17.7* 18.8*  HGB 10.9* 11.8*  HCT 31.7* 33.0*  PLT 270 296   BMET  Basename 09/17/11 0700  NA 136  K 3.9  CL 103  CO2 23  GLUCOSE 115*  BUN 18  CREATININE 0.78  CALCIUM 10.0     Studies/Results: No results found.  Medications:  Scheduled:    . cefTRIAXone (ROCEPHIN)  IV  2 g Intravenous Q12H  . digoxin  0.125 mg Oral Daily  . famotidine  20 mg Oral BID  . HYDROmorphone PCA 0.3 mg/mL   Intravenous Q4H  . metoprolol tartrate  25 mg Oral BID  . DISCONTD: fentaNYL   Intravenous Q4H   Continuous:    . sodium chloride 20 mL/hr at 09/19/11 0921  . heparin 1,700 Units/hr (09/19/11 1042)   NWG:NFAOZH chloride, acetaminophen, diphenhydrAMINE, diphenhydrAMINE, naloxone, ondansetron (ZOFRAN) IV, polyethylene glycol, sodium chloride  Assessment/Plan: 1. Pneumococcal bacteremia and prosthetic valve endocarditis -cont current antibiotics as directed by ID -Cardiology following - repeat TEE planned for Wed 09/22/2010  -TCTS following - will need redo MVR eventually  2. Small subarachnoid hemorrhage (suspect post-traumatic vs. septic emboli), now resolved, back to heparin drip now, may bridge with lovenox, patient refuse blood draw, may resume coumadin  3. Delirium, it was describe during initial presentation, DDx, complex partial seizure vs encephalopathy, will order EEG, empirically treat her with vimpat now  3. Hypertension  4. Atrial fibrillation  5. Mechanical heart valve     LOS: 13 days   Antonie Borjon 09/19/2011, 1:24 PM  Dr. Joycelyn Schmid has personally reviewed chart, pertinent data, examined the patient and developed the plan of care.

## 2011-09-19 NOTE — Progress Notes (Signed)
Occupational Therapy Evaluation Patient Details Name: Carrie [name] MRN: [id number] DOB: 1940/09/27 Today's Date: 09/19/2011  Problem List:  Patient Active Problem List  Diagnoses  . CONSTIPATION  . Encephalopathy acute  . Atrial fibrillation with rapid ventricular response  . S/P mitral valve replacement, St Jude  . Pneumococcal septicemia  . Subarachnoid hemorrhage on CT this adm  . Muscle weakness of lower extremity  . Presence of permanent cardiac pacemaker  . Chronic anticoagulation, ( INR goal 2.5-3.5 under normal circumstances for ST Jude MVR)  . Endocarditis of prosthetic valve    Past Medical History:  Past Medical History  Diagnosis Date  . Hypertension   . Atrial fibrillation     normal coroonaries cath 2004. Dr Clarene [facility]   . Sick sinus syndrome     Dr Sharrell Ku. EP study negative for inducibel arrythmia. ? pacer since 2007  . Hyperlipidemia   . Arthritis   . Anxiety   . Diverticula of colon   . Pneumonia 2009    resolved.? OPD Rx   Past Surgical History:  Past Surgical History  Procedure Date  . Pacemaker insertion   . Mitral valve replacement 1998    St Jude  . Appendectomy   . Tubal ligation     OT Assessment/Plan/Recommendation OT Assessment Clinical Impression Statement: This 70 y.o. female presents to OT with severe cognitive defictis.  Pt. with focused attention only.  Did not follow any commands during OT eval.  Currently she is total A with all ADLs, except she will spontaneously self feed at times, per nursing.  Pt required max A for bed mobility, min A to sit EOB.  Unable to move pt. sit to stand with one person assist - pt. does not initiate assistance.  Pt. will require 24 hour max assist at discharge.  Recommend SNF OT Recommendation/Assessment: Patient will need skilled OT in the acute care venue OT Problem List: Decreased activity tolerance;Impaired balance (sitting and/or standing);Decreased cognition;Decreased knowledge of use of DME  or AE;Decreased strength Barriers to Discharge: Decreased caregiver support OT Therapy Diagnosis : Generalized weakness;Cognitive deficits OT Plan OT Frequency: Min 1X/week OT Treatment/Interventions: Self-care/ADL training;DME and/or AE instruction;Therapeutic activities;Patient/family education;Balance training OT Recommendation Follow Up Recommendations: Skilled nursing facility Equipment Recommended: Defer to next venue Individuals Consulted Consulted and Agree with Results and Recommendations: Patient unable/family or caregiver not available OT Goals Acute Rehab OT Goals OT Goal Formulation: Patient unable to participate in goal setting Time For Goal Achievement: 2 weeks ADL Goals Pt Will Perform Grooming: with mod assist;Supported;Sitting, chair ADL Goal: Grooming - Progress: Not met Pt Will Perform Upper Body Bathing: with mod assist;Supported;Sitting, chair ADL Goal: Upper Body Bathing - Progress: Not met Pt Will Transfer to Toilet: with mod assist;3-in-1;Stand pivot transfer ADL Goal: Toilet Transfer - Progress: Not met Additional ADL Goal #1: Pt. will demonstrate sustained attention to simple ADL task x 5 mins. with no cues ADL Goal: Additional Goal #1 - Progress: Not met  OT Evaluation Precautions/Restrictions  Precautions Precautions: Fall Required Braces or Orthoses: No Restrictions Weight Bearing Restrictions: No Prior Functioning Home Living Lives With: Spouse (per chart review - pt unable to provide info) Additional Comments: Pt. unable to provide info Prior Function Comments: Pt. unable to provide info, and no family present.  Review of chart indicates pt. may have been independent. ADL ADL Eating/Feeding: Simulated;Moderate assistance Where Assessed - Eating/Feeding: Bed level Grooming: Performed;+1 Total assistance;Wash/dry hands;Wash/dry face;Teeth care Where Assessed - Grooming: Sitting, bed;Supine, head of bed up;Unsupported  Upper Body Bathing:  Simulated;+1 Total assistance Where Assessed - Upper Body Bathing: Supine, head of bed up;Sitting, bed;Unsupported Lower Body Bathing: Simulated;+1 Total assistance Where Assessed - Lower Body Bathing: Supine, head of bed up;Rolling right and/or left Upper Body Dressing: Simulated;+1 Total assistance Where Assessed - Upper Body Dressing: Supine, head of bed up;Unsupported;Sitting, bed Lower Body Dressing: Simulated;+1 Total assistance Where Assessed - Lower Body Dressing: Supine, head of bed up;Rolling right and/or left Toilet Transfer: Not assessed (Unable to transfer pt. safely with +1 assist ) Toilet Transfer Method: Not assessed ADL Comments: Pt. moved supine to EOB with max A and max verbal cues.  Pt. followed no commands.  When lotion placed in hands, pt. rubbed them together briefly then stopped  not completing task.  Pt. did not attempt to answer any questions.  Unable to state name despite multiple choices given. Verbalizations did not fit within context of task or conversation.  Pt. sat EOB x ~7 mins with min guard assist to min A - status variable.  Pt. occasionally leans to Rt.   Vision/Perception  Vision - Assessment Vision Assessment: Vision not tested Additional Comments: Pt unable Perception Perception: Not tested Praxis Praxis: Not tested Cognition Cognition Arousal/Alertness: Awake/alert Overall Cognitive Status: Impaired Attention: Impaired Current Attention Level: Focused Attention - Other Comments: seconds only Memory:  (unable to asses due to focused attention) Orientation Level: Disoriented to person;Disoriented to place;Disoriented to time;Disoriented to situation (Pt. unable to state name, despite multiple choices given) Following Commands: Other (comment) (Pt. followed no one step commands) Safety/Judgement:  (unable to asses) Awareness of Deficits:  (unable to asses) Cognition - Other Comments: See comments under ADL  session Sensation/Coordination Sensation Light Touch: Not tested Stereognosis: Not tested Proprioception: Not tested Coordination Gross Motor Movements are Fluid and Coordinated: Yes Fine Motor Movements are Fluid and Coordinated: Yes Extremity Assessment RUE Assessment RUE Assessment:  (unable to accurately asses.  ) LUE Assessment LUE Assessment:  (Unable to accurately asses) Mobility  Bed Mobility Bed Mobility: Yes Supine to Sit: 2: Max assist Supine to Sit Details (indicate cue type and reason): Pt required assist and maximal cues to bring bil. LEs off bed.  Pt. would place them back on bed.  Followed no commands.  Required assist to initiate an sequence entire task Sit to Supine - Right: 2: Max assist Transfers Transfers: Yes Sit to Stand: From bed;1: +1 Total assist (attempted sit to stand. Unable with +1 assist) Exercises   End of Session OT - End of Session Activity Tolerance:  (limited by cognition) Patient left: in bed;with call bell in reach Nurse Communication:  (results of eval) General Behavior During Session: Flat affect Cognition: Impaired   Mico Spark, Ursula Alert M 09/19/2011, 7:45 PM

## 2011-09-19 NOTE — Progress Notes (Signed)
ANTICOAGULATION CONSULT NOTE - Follow Up Consult  Pharmacy Consult for heparin  Indication: atrial fibrillation and St. Jude MVR  Allergies  Allergen Reactions  . Codeine Nausea And Vomiting    Patient Measurements: Height: 5\' 1"  (154.9 cm) Weight: 137 lb 5.6 oz (62.3 kg) IBW/kg (Calculated) : 47.8  Adjusted Body Weight: 60  Vital Signs: Temp: 98.5 F (36.9 C) (12/28 2054) Temp src: Oral (12/28 2054) BP: 154/63 mmHg (12/28 2054) Pulse Rate: 72  (12/28 2054)  Labs:  Basename 09/19/11 09/18/11 1215 09/18/11 0317 09/17/11 0700 09/17/11 0444  HGB -- -- 11.8* -- 11.7*  HCT -- -- 33.0* -- 34.5*  PLT -- -- 296 -- 359  APTT -- -- -- -- 34  LABPROT -- -- -- -- 17.4*  INR -- -- -- -- 1.40  HEPARINUNFRC 0.19* 0.11* <0.10* -- --  CREATININE -- -- -- 0.78 --  CKTOTAL -- -- -- -- --  CKMB -- -- -- -- --  TROPONINI -- -- -- -- --   Estimated Creatinine Clearance: 56.2 ml/min (by C-G formula based on Cr of 0.78).   Medications:  Infusions:    . sodium chloride 20 mL/hr at 09/18/11 1745  . heparin 1,450 Units/hr (09/18/11 1346)    Assessment: 70 year old female with Afib/St. Jude's MVR for anticoagulation. Heparin level 0.19 which is below goal.  Goal of Therapy:  Heparin level 0.3-0.7 units/ml   Plan:  Increase IV heparin to 1700 units/hr Recheck level in 8 hours to assure patient is at steady state given age. Severiano Gilbert 09/19/2011,2:11 AM

## 2011-09-20 LAB — CBC
MCH: 29.7 pg (ref 26.0–34.0)
MCHC: 34.8 g/dL (ref 30.0–36.0)
MCV: 85.4 fL (ref 78.0–100.0)
Platelets: 255 10*3/uL (ref 150–400)
RDW: 14.8 % (ref 11.5–15.5)

## 2011-09-20 LAB — COMPREHENSIVE METABOLIC PANEL
Albumin: 2.4 g/dL — ABNORMAL LOW (ref 3.5–5.2)
Alkaline Phosphatase: 148 U/L — ABNORMAL HIGH (ref 39–117)
BUN: 11 mg/dL (ref 6–23)
Chloride: 99 mEq/L (ref 96–112)
Potassium: 3.8 mEq/L (ref 3.5–5.1)
Total Bilirubin: 0.5 mg/dL (ref 0.3–1.2)

## 2011-09-20 MED ORDER — WARFARIN SODIUM 7.5 MG PO TABS
7.5000 mg | ORAL_TABLET | Freq: Once | ORAL | Status: AC
  Administered 2011-09-20: 7.5 mg via ORAL
  Filled 2011-09-20: qty 1

## 2011-09-20 MED ORDER — HEPARIN SOD (PORCINE) IN D5W 100 UNIT/ML IV SOLN
1600.0000 [IU]/h | INTRAVENOUS | Status: DC
  Administered 2011-09-20 – 2011-09-22 (×3): 1600 [IU]/h via INTRAVENOUS
  Filled 2011-09-20 (×5): qty 250

## 2011-09-20 NOTE — Progress Notes (Signed)
Patient ID: Carrie [name], female   DOB: 12-04-1940, 70 y.o.   MRN: [id number]   Subjective: Patient with prosthetic endocarditis, also delirium, alone today, she is more alert today, oriented to her name and time, year, vimpat was started yesterday for possible cpseizrue, EEG is pending.   Objective: Vital signs in last 24 hours: Temp:  [97.6 F (36.4 C)-99.4 F (37.4 C)] 99.4 F (37.4 C) (12/30 1014) Pulse Rate:  [67-88] 70  (12/30 1014) Resp:  [16-20] 16  (12/30 1014) BP: (122-151)/(66-83) 122/66 mmHg (12/30 1014) SpO2:  [95 %-100 %] 96 % (12/30 1014) Weight:  [75.7 kg (166 lb 14.2 oz)] 166 lb 14.2 oz (75.7 kg) (12/30 0539) Weight change: 1.9 kg (4 lb 3 oz) Last BM Date: 09/18/11  Filed Vitals:   09/20/11 0409 09/20/11 0539 09/20/11 0800 09/20/11 1014  BP:  151/76  122/66  Pulse:  67  70  Temp:  98.3 F (36.8 C)  99.4 F (37.4 C)  TempSrc:  Oral  Oral  Resp: 20 20 20 16   Height:      Weight:  75.7 kg (166 lb 14.2 oz)    SpO2: 100% 99% 98% 96%     Intake/Output from previous day: 12/29 0701 - 12/30 0700 In: 985.6 [I.V.:900.6; IV Piggyback:85] Out: 2550 [Urine:2550] Intake/Output this shift:    Physical Examination:  CV: SYSTOLIC MURMUR AND MECH CLICK.  Mental Status Exam: oriented to 2012, name, dob, but still have difficulty carrying on a conversation, tends to repeat herself, I did not observe episodes of tremorish eye closing movement.  Cranial Nerve Exam: PUPILS equal reactive.  EOMI.  Facial symmetry is present. Extraocular movements are full. Visual fields are full.   Motor Exam: The patient moves all 4 extremities  Deep tendon reflexes: Deep tendon reflexes are symmetric throughout. Toes are downgoing bilaterally.   Lab Results:  [facility] 09/20/11 0625 09/19/11 2025  WBC 15.6* 16.5*  HGB 11.2* 11.4*  HCT 32.2* 32.0*  PLT 255 257   BMET  Basename 09/20/11 0918 09/19/11 2025  NA 132* 129*  K 3.8 3.3*  CL 99 100  CO2 20 21  GLUCOSE 103* 127*    BUN 11 14  CREATININE 0.64 0.72  CALCIUM 9.5 9.3    Studies/Results: No results found.  Medications:  Scheduled:    . cefTRIAXone (ROCEPHIN)  IV  2 g Intravenous Q12H  . digoxin  0.125 mg Oral Daily  . famotidine  20 mg Oral BID  . HYDROmorphone PCA 0.3 mg/mL   Intravenous Q4H  . lacosamide (VIMPAT) IV  100 mg Intravenous Q12H  . metoprolol tartrate  25 mg Oral BID  . potassium chloride  10 mEq Intravenous Q1 Hr x 3  . warfarin  7.5 mg Oral Once  . warfarin  7.5 mg Oral ONCE-1800   Continuous:    . sodium chloride 20 mL/hr at 09/20/11 1125  . heparin 1,600 Units/hr (09/20/11 1030)  . DISCONTD: heparin 1,700 Units/hr (09/20/11 0127)   WGN:FAOZHY chloride, acetaminophen, diphenhydrAMINE, diphenhydrAMINE, naloxone, ondansetron (ZOFRAN) IV, polyethylene glycol, sodium chloride  Assessment/Plan: 1. Pneumococcal bacteremia and prosthetic valve endocarditis -cont current antibiotics as directed by ID -Cardiology following - repeat TEE planned for Wed 09/22/2010  -TCTS following - likely redo MVR eventually  2. Small subarachnoid hemorrhage (suspect post-traumatic vs. septic emboli), now resolved, back to heparin drip now,  Also on coumadin  3. Delirium, it was described during initial presentation, DDx, complex partial seizure vs encephalopathy, will order EEG, empirically treat  her with vimpat now, seems to have mild improvement today.  3. Hypertension  4. Atrial fibrillation     LOS: 14 days   Wessley Emert 09/20/2011, 11:43 AM  Dr. Joycelyn Schmid has personally reviewed chart, pertinent data, examined the patient and developed the plan of care.

## 2011-09-20 NOTE — Progress Notes (Signed)
Chart reviewed - appears to be stable from cardiac standpoint. Warfarin initiated yesterday.  Spent a long time with pt & family yesterday discussing upcoming plans. Will discuss scheduling of re-Look TEE for this week during [facility] am meeting tomorrow.  Will see with full PN after meeting tomorrow.  Call if any ?s, concerns, or acute decompensation.  Marykay Lex, M.D., M.S. [facility] [address] [place], [place]  [address]  [phone]  09/20/2011 11:38 AM

## 2011-09-20 NOTE — Progress Notes (Signed)
ANTICOAGULATION CONSULT NOTE - Follow Up Consult  Pharmacy Consult for heparin and coumadin Indication: St Jude MVR  Allergies  Allergen Reactions  . Codeine Nausea And Vomiting    Patient Measurements: Height: 5\' 1"  (154.9 cm) Weight: 166 lb 14.2 oz (75.7 kg) IBW/kg (Calculated) : 47.8   Vital Signs: Temp: 98.3 F (36.8 C) (12/30 0539) Temp src: Oral (12/30 0539) BP: 151/76 mmHg (12/30 0539) Pulse Rate: 67  (12/30 0539)  Labs:  Basename 09/20/11 0625 09/19/11 2025 09/19/11 1614 09/19/11 0610 09/19/11  HGB 11.2* 11.4* -- -- --  HCT 32.2* 32.0* -- 31.7* --  PLT 255 257 -- 270 --  APTT -- -- -- -- --  LABPROT -- -- 17.0* -- --  INR -- -- 1.36 -- --  HEPARINUNFRC 0.69 0.59 -- -- 0.19*  CREATININE -- 0.72 -- -- --  CKTOTAL -- -- -- -- --  CKMB -- -- -- -- --  TROPONINI -- -- -- -- --   Estimated Creatinine Clearance: 61.8 ml/min (by C-G formula based on Cr of 0.72).   Medications:  Scheduled:    . cefTRIAXone (ROCEPHIN)  IV  2 g Intravenous Q12H  . digoxin  0.125 mg Oral Daily  . famotidine  20 mg Oral BID  . HYDROmorphone PCA 0.3 mg/mL   Intravenous Q4H  . lacosamide (VIMPAT) IV  100 mg Intravenous Q12H  . metoprolol tartrate  25 mg Oral BID  . potassium chloride  10 mEq Intravenous Q1 Hr x 3  . warfarin  7.5 mg Oral Once    Assessment: Heparin level 0.69 and at top of desired therapeutic range. Restarted on Coumadin yesterday. Goal of Therapy:  Heparin level 0.3-0.7 units/ml INR 2.5-3.5  Plan:  Heparin level is creeping upward. Will decrease rate to 1600 units/hr to keep from moving out of desired range. Will give coumadin 7.5 mg and f/u INR.  Eugene Garnet 09/20/2011,9:59 AM

## 2011-09-20 NOTE — Progress Notes (Signed)
Patient ID: LIZVETTE LIGHTSEY, female   DOB: 1941/01/28, 70 y.o.   MRN: [id number] Patient ID: LATIMA HAMZA, female   DOB: 1941-03-26, 70 y.o.   MRN: [id number] Subjective: Patient seen. No complaints  Objective: Weight change: 1.9 kg (4 lb 3 oz)  Intake/Output Summary (Last 24 hours) at 09/20/11 1036 Last data filed at 09/20/11 0650  Gross per 24 hour  Intake 985.58 ml  Output   2550 ml  Net -1564.42 ml   BP 122/66  Pulse 70  Temp(Src) 99.4 F (37.4 C) (Oral)  Resp 16  Ht 5\' 1"  (1.549 m)  Wt 75.7 kg (166 lb 14.2 oz)  BMI 31.53 kg/m2  SpO2 96% Physical Exam: General appearance: alert, cooperative and no distress,pallor Head: Normocephalic, without obvious abnormality, atraumatic Neck: no adenopathy, no carotid bruit, no JVD, supple, symmetrical, trachea midline and thyroid not enlarged, symmetric, no tenderness/mass/nodules Lungs: clear to auscultation bilaterally Heart: irregularly irregular Abdomen: soft, non-tender; bowel sounds normal; no masses,  no organomegaly Extremities: extremities normal, atraumatic, no cyanosis or edema Skin: Skin color, texture, turgor normal. No rashes or lesions Neurology-no lateralizing signs.  Lab Results: Results for orders placed during the hospital encounter of 09/06/11 (from the past 48 hour(s))  HEPARIN LEVEL (UNFRACTIONATED)     Status: Abnormal   Collection Time   09/18/11 12:15 PM      Component Value Range Comment   Heparin Unfractionated 0.11 (*) 0.30 - 0.70 (IU/mL)   HEPARIN LEVEL (UNFRACTIONATED)     Status: Abnormal   Collection Time   09/19/11 12:00 AM      Component Value Range Comment   Heparin Unfractionated 0.19 (*) 0.30 - 0.70 (IU/mL)   CBC     Status: Abnormal   Collection Time   09/19/11  6:10 AM      Component Value Range Comment   WBC 17.7 (*) 4.0 - 10.5 (K/uL)    RBC 3.69 (*) 3.87 - 5.11 (MIL/uL)    Hemoglobin 10.9 (*) 12.0 - 15.0 (g/dL)    HCT 78.2 (*) 95.6 - 46.0 (%)    MCV 85.9  78.0 - 100.0 (fL)    MCH  29.5  26.0 - 34.0 (pg)    MCHC 34.4  30.0 - 36.0 (g/dL)    RDW 21.3  08.6 - 57.8 (%)    Platelets 270  150 - 400 (K/uL)   PROTIME-INR     Status: Abnormal   Collection Time   09/19/11  4:14 PM      Component Value Range Comment   Prothrombin Time 17.0 (*) 11.6 - 15.2 (seconds)    INR 1.36  0.00 - 1.49    HEPARIN LEVEL (UNFRACTIONATED)     Status: Normal   Collection Time   09/19/11  8:25 PM      Component Value Range Comment   Heparin Unfractionated 0.59  0.30 - 0.70 (IU/mL)   CBC     Status: Abnormal   Collection Time   09/19/11  8:25 PM      Component Value Range Comment   WBC 16.5 (*) 4.0 - 10.5 (K/uL)    RBC 3.77 (*) 3.87 - 5.11 (MIL/uL)    Hemoglobin 11.4 (*) 12.0 - 15.0 (g/dL)    HCT 46.9 (*) 62.9 - 46.0 (%)    MCV 84.9  78.0 - 100.0 (fL)    MCH 30.2  26.0 - 34.0 (pg)    MCHC 35.6  30.0 - 36.0 (g/dL)    RDW 52.8  41.3 - 24.4 (%)  Platelets 257  150 - 400 (K/uL)   COMPREHENSIVE METABOLIC PANEL     Status: Abnormal   Collection Time   09/19/11  8:25 PM      Component Value Range Comment   Sodium 129 (*) 135 - 145 (mEq/L)    Potassium 3.3 (*) 3.5 - 5.1 (mEq/L)    Chloride 100  96 - 112 (mEq/L)    CO2 21  19 - 32 (mEq/L)    Glucose, Bld 127 (*) 70 - 99 (mg/dL)    BUN 14  6 - 23 (mg/dL)    Creatinine, Ser 1.61  0.50 - 1.10 (mg/dL)    Calcium 9.3  8.4 - 10.5 (mg/dL)    Total Protein 8.2  6.0 - 8.3 (g/dL)    Albumin 2.3 (*) 3.5 - 5.2 (g/dL)    AST 096 (*) 0 - 37 (U/L)    ALT 205 (*) 0 - 35 (U/L)    Alkaline Phosphatase 172 (*) 39 - 117 (U/L)    Total Bilirubin 0.3  0.3 - 1.2 (mg/dL)    GFR calc non Af Amer 86 (*) >90 (mL/min)    GFR calc Af Amer >90  >90 (mL/min)   CBC     Status: Abnormal   Collection Time   09/20/11  6:25 AM      Component Value Range Comment   WBC 15.6 (*) 4.0 - 10.5 (K/uL)    RBC 3.77 (*) 3.87 - 5.11 (MIL/uL)    Hemoglobin 11.2 (*) 12.0 - 15.0 (g/dL)    HCT 04.5 (*) 40.9 - 46.0 (%)    MCV 85.4  78.0 - 100.0 (fL)    MCH 29.7  26.0 - 34.0  (pg)    MCHC 34.8  30.0 - 36.0 (g/dL)    RDW 81.1  91.4 - 78.2 (%)    Platelets 255  150 - 400 (K/uL)   HEPARIN LEVEL (UNFRACTIONATED)     Status: Normal   Collection Time   09/20/11  6:25 AM      Component Value Range Comment   Heparin Unfractionated 0.69  0.30 - 0.70 (IU/mL)     Micro Results: No results found for this or any previous visit (from the past 240 hour(s)).  Studies/Results: Ct Angio Head W/cm &/or Wo Cm  09/08/2011  *RADIOLOGY REPORT*  Clinical Data:  2-week history of fever and malaise.  Possible right common carotid artery dissections seen on CT cervical spine.  CT ANGIOGRAPHY HEAD AND NECK  Technique:  Multidetector CT imaging of the head and neck was performed using the standard protocol during bolus administration of intravenous contrast.  Multiplanar CT image reconstructions including MIPs were obtained to evaluate the vascular anatomy. Carotid stenosis measurements (when applicable) are obtained utilizing NASCET criteria, using the distal internal carotid diameter as the denominator.  Contrast: OMNIPAQUE IOHEXOL 350 MG/ML IV SOLN an  Comparison:  CT head and C-spine earlier today.  CTA NECK  Findings:  There is reflux of contrast into the left internal jugular vein.  This may be related to the left sided pacemaker placement.  There is fair opacification of the craniocerebral vasculature.  Atheromatous change can be seen in the transverse arch.  There is considerable Hounsfield artifact from the pacemaker battery pack. There is no visible proximal great vessel dissection or stenosis.  Both carotid bifurcations are widely patent.  Mild nonstenotic calcific atheromatous change can be seen on the right.  Cervical internal carotid arteries tortuous but patent without dissection. Both vertebrals are patent  and equal in size through the neck. Possible 15 x 16 mm right thyroid nodule.  Consider thyroid ultrasound for further evaluation.  Lung apices clear.   Review of the MIP  images confirms the above findings.  IMPRESSION: No visible proximal great vessel dissection within limits of detection by artifact from pacemaker battery pack.  No evidence for flow reducing lesion in the right or left carotid system.  CTA HEAD  Findings:  Calcific nonstenotic atheromatous change of the skull base, cavernous, and supraclinoid internal carotid arteries.  No visible proximal stenosis of the anterior, middle, or posterior cerebral arteries.  Basilar artery patent without focal stenosis. No visible intracranial aneurysm.  Post infusion imaging of the head reveals no abnormal enhancement.  Chronic left basal ganglia infarct redemonstrated.  Left frontal subarachnoid blood appears slightly greater than was seen earlier. It is possible a component of this increased conspicuity relates to enhancement postcontrast. Recommend repeat noncontrast examination for continued surveillance.   Review of the MIP images confirms the above findings.  IMPRESSION: No proximal carotid or basilar stenosis.  No visible intracranial dissection.  Suspect slight increase in left frontal subarachnoid blood. I cannot completely exclude that this increased prominence relates to a component of enhancement Recommend repeat noncontrast imaging in 24-48 hours.  Original Report Authenticated By: Elsie Stain, M.D.   Dg Chest 2 View  09/06/2011  *RADIOLOGY REPORT*  Clinical Data: Rule out infiltrate. Confusion.  CHEST - 2 VIEW  Comparison: 02/20/2008  Findings:  Heart size appears mildly enlarged.  Prior median sternotomy CABG procedure.  Left chest wall pacer device is noted with lead in the right ventricle.  There are no pleural effusions or interstitial edema.  Chronic bronchitic changes are noted bilaterally.  IMPRESSION:  1.  Cardiac enlargement. 2.  No acute changes.  Original Report Authenticated By: Rosealee Albee, M.D.   Ct Head Wo Contrast  09/16/2011  *RADIOLOGY REPORT*  Clinical Data: Follow up subarachnoid  hemorrhage  CT HEAD WITHOUT CONTRAST  Technique:  Contiguous axial images were obtained from the base of the skull through the vertex without contrast.  Comparison: 09/13/2011  Findings: Near complete resolution of prior left frontal subarachnoid hemorrhage.  No parenchymal hemorrhage or new extra- axial hemorrhage is seen.  No mass lesion, mass effect, or midline shift.  Subcortical white matter and periventricular small vessel ischemic changes.  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  Mild global cortical atrophy.  No ventriculomegaly.  The visualized paranasal sinuses are essentially clear. The mastoid air cells are unopacified.  No evidence of calvarial fracture.  IMPRESSION: No evidence of acute intracranial abnormality.  Near complete resolution of prior left frontal subarachnoid hemorrhage.  Original Report Authenticated By: Charline Bills, M.D.   Ct Head Wo Contrast  09/13/2011  *RADIOLOGY REPORT*  Clinical Data: New onset headache and neck soreness.  CT HEAD WITHOUT CONTRAST  Technique:  Contiguous axial images were obtained from the base of the skull through the vertex without contrast.  Comparison: Head CT scans 09/10/2011 and 09/09/2011.  Findings: Small amount of subarachnoid hemorrhage in the left frontal lobe continues to resolve.  No new hemorrhage is identified.  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  No midline shift, hydrocephalus or abnormal extra-axial fluid collection is identified.  IMPRESSION:  1.  No acute finding. 2.  Resolving subarachnoid hemorrhage on the left.  Original Report Authenticated By: Bernadene Bell. Maricela Curet, M.D.   Ct Head Wo Contrast  09/10/2011  *RADIOLOGY REPORT*  Clinical Data: Follow  up subarachnoid hemorrhage  CT HEAD WITHOUT CONTRAST  Technique:  Contiguous axial images were obtained from the base of the skull through the vertex without contrast.  Comparison: 09/09/2011  Findings: Hyperdense subarachnoid hemorrhage over the convexity left  frontal region has improved.  No new areas of hemorrhage are identified.  No subdural hemorrhage is present.  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  No acute infarct.  Negative for mass lesion.  IMPRESSION: Small areas of subarachnoid hemorrhage over the convexity on the left have improved.  No new area of hemorrhage or infarction.  Original Report Authenticated By: Camelia Phenes, M.D.   Ct Head Wo Contrast  09/09/2011  *RADIOLOGY REPORT*  Clinical Data: Followup subarachnoid hemorrhage.  CT HEAD WITHOUT CONTRAST  Technique:  Contiguous axial images were obtained from the base of the skull through the vertex without contrast.  Comparison: 09/07/2011 CT and 11/10/2005 MR.  Findings: Compared to the 09/07/2011 noncontrast CT examination. There has been an increase in amount of subarachnoid blood in the left frontal region.  Etiology of this is indeterminate.  Result of venous thrombosis / infarct not excluded.  Nonspecific white matter type changes most notable left frontal region.  Remote left caudate head infarct with encephalomalacia. This is new compared to 2007 MR. No CT evidence of large acute infarct.  Small acute infarct cannot be excluded by CT.  No intracranial mass lesion detected on this unenhanced exam.  IMPRESSION: Compared to the 09/07/2011 noncontrast CT examination.  There has been an increase in amount of subarachnoid blood in the left frontal region.  Etiology of this is indeterminate. Please see above.  Original Report Authenticated By: Fuller Canada, M.D.   Ct Head Wo Contrast  09/07/2011  *RADIOLOGY REPORT*  Clinical Data: Headaches  CT HEAD WITHOUT CONTRAST  Technique:  Contiguous axial images were obtained from the base of the skull through the vertex without contrast.  Comparison: [facility] brain dated 11/10/2005  Findings: Tiny hyperdense foci overlying the left frontal lobe (series 2/images 14 and 22) and left medial frontal lobe (series 2/image 19),  possibly reflecting subarachnoid hemorrhage.  No mass lesion, mass effect, or midline shift.  No CT evidence of acute infarction.  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  Subcortical white matter and periventricular small vessel ischemic changes.  Mildly age related atrophy.  No ventriculomegaly.  The visualized paranasal sinuses are essentially clear. The mastoid air cells are unopacified.  No evidence of calvarial fracture.  IMPRESSION: Possible tiny foci of subarachnoid hemorrhage overlying the left frontal lobe, as described above.  Old lacunar infarct with small vessel ischemic changes  Critical Value/emergent results were called by telephone at the time of interpretation on 09/07/2011  at 1810 hours  to  Dr Delford Field, who verbally acknowledged these results.  Original Report Authenticated By: Charline Bills, M.D.   Ct Angio Neck W/cm &/or Wo/cm  09/08/2011  *RADIOLOGY REPORT*  Clinical Data:  2-week history of fever and malaise.  Possible right common carotid artery dissections seen on CT cervical spine.  CT ANGIOGRAPHY HEAD AND NECK  Technique:  Multidetector CT imaging of the head and neck was performed using the standard protocol during bolus administration of intravenous contrast.  Multiplanar CT image reconstructions including MIPs were obtained to evaluate the vascular anatomy. Carotid stenosis measurements (when applicable) are obtained utilizing NASCET criteria, using the distal internal carotid diameter as the denominator.  Contrast: OMNIPAQUE IOHEXOL 350 MG/ML IV SOLN an  Comparison:  CT head  and C-spine earlier today.  CTA NECK  Findings:  There is reflux of contrast into the left internal jugular vein.  This may be related to the left sided pacemaker placement.  There is fair opacification of the craniocerebral vasculature.  Atheromatous change can be seen in the transverse arch.  There is considerable Hounsfield artifact from the pacemaker battery pack. There is no visible proximal  great vessel dissection or stenosis.  Both carotid bifurcations are widely patent.  Mild nonstenotic calcific atheromatous change can be seen on the right.  Cervical internal carotid arteries tortuous but patent without dissection. Both vertebrals are patent and equal in size through the neck. Possible 15 x 16 mm right thyroid nodule.  Consider thyroid ultrasound for further evaluation.  Lung apices clear.   Review of the MIP images confirms the above findings.  IMPRESSION: No visible proximal great vessel dissection within limits of detection by artifact from pacemaker battery pack.  No evidence for flow reducing lesion in the right or left carotid system.  CTA HEAD  Findings:  Calcific nonstenotic atheromatous change of the skull base, cavernous, and supraclinoid internal carotid arteries.  No visible proximal stenosis of the anterior, middle, or posterior cerebral arteries.  Basilar artery patent without focal stenosis. No visible intracranial aneurysm.  Post infusion imaging of the head reveals no abnormal enhancement.  Chronic left basal ganglia infarct redemonstrated.  Left frontal subarachnoid blood appears slightly greater than was seen earlier. It is possible a component of this increased conspicuity relates to enhancement postcontrast. Recommend repeat noncontrast examination for continued surveillance.   Review of the MIP images confirms the above findings.  IMPRESSION: No proximal carotid or basilar stenosis.  No visible intracranial dissection.  Suspect slight increase in left frontal subarachnoid blood. I cannot completely exclude that this increased prominence relates to a component of enhancement Recommend repeat noncontrast imaging in 24-48 hours.  Original Report Authenticated By: Elsie Stain, M.D.   Ct Cervical Spine W Contrast  09/07/2011  *RADIOLOGY REPORT*  Clinical Data: C-spine tenderness, evaluate for fracture or abscess  CT CERVICAL SPINE WITH CONTRAST  Technique:  Multidetector CT  imaging of the cervical spine was performed during intravenous contrast administration. Multiplanar CT image reconstructions were also generated.  Contrast: OMNIPAQUE IOHEXOL 300 MG/ML IV SOLN  Comparison: None.  Findings: Normal cervical lordosis.  No evidence of fracture or dislocation.  Vertebral body heights are maintained.  The dens appears intact.  No prevertebral soft tissue swelling.  Mild multilevel degenerative changes.  No epidural abscess/fluid collection is seen.  There is a possible linear filling defect within the right common carotid artery (series 606/image 69, series 605/image 4), and although this may reflect artifact at the thoracic inlet, a focal dissection cannot be excluded.  IMPRESSION: Apparent linear filling defect within the right common carotid artery, possibly artifactual, although focal dissection cannot be excluded.  Consider CTA/MRA neck for further characterization as clinically warranted.  No evidence of fracture or dislocation.  No epidural abscess/fluid collection is seen.  Critical Value/emergent results were called by telephone at the time of interpretation on 09/07/2011  at 1810 hours  to  Dr Delford Field, who verbally acknowledged these results.  Original Report Authenticated By: Charline Bills, M.D.   Ct Thoracic Spine Wo Contrast  09/10/2011  *RADIOLOGY REPORT*  Clinical Data:  Leg weakness  CT THORACIC AND LUMBAR SPINE WITHOUT CONTRAST  Technique:  Multidetector CT imaging of the thoracic and lumbar spine was performed without contrast. Multiplanar CT image reconstructions  were also generated.  Comparison:  None  CT THORACIC SPINE  Findings:  No fracture or dislocation is seen.  Mild multilevel degenerative changes of the thoracic spine, most prominent at T11-12.  Lower cervical spine is incompletely imaged.  Left paracentral disc herniation at C5-6 (series 9/image 14).  Visualized thyroid is unremarkable.  Cardiomegaly.  Coronary atherosclerosis.  Valve annuloplasty.   Atherosclerotic calcifications of the aortic arch.  Pacemaker leads.  Evaluation of the lung parenchyma is constrained by respiratory motion.  Scattered patchy left upper lobe opacities. Mild right middle lobe opacity / scarring.  Small bilateral pleural effusions with associated lower lobe atelectasis.  IMPRESSION: No fracture or dislocation is seen.  Mild multilevel degenerative changes, most prominent at T11-12.  Left paracentral disc herniation at C5-6.  Evaluation of the lung parenchyma is constrained by respiratory motion.  Patchy left upper lobe and right middle lobe opacities. Small bilateral pleural effusions with associated lower lobe atelectasis.  CT LUMBAR SPINE  Findings: No fracture or dislocation is seen.  Very mild multilevel degenerative changes.  Atherosclerotic calcifications of the abdominal aorta and branch vessels.  Suspected calcified uterine fibroids.  IMPRESSION: No fracture or dislocation is seen.  Very mild multilevel degenerative changes.  Original Report Authenticated By: Charline Bills, M.D.   Ct Lumbar Spine Wo Contrast  09/10/2011  *RADIOLOGY REPORT*  Clinical Data:  Leg weakness  CT THORACIC AND LUMBAR SPINE WITHOUT CONTRAST  Technique:  Multidetector CT imaging of the thoracic and lumbar spine was performed without contrast. Multiplanar CT image reconstructions were also generated.  Comparison:  None  CT THORACIC SPINE  Findings:  No fracture or dislocation is seen.  Mild multilevel degenerative changes of the thoracic spine, most prominent at T11-12.  Lower cervical spine is incompletely imaged.  Left paracentral disc herniation at C5-6 (series 9/image 14).  Visualized thyroid is unremarkable.  Cardiomegaly.  Coronary atherosclerosis.  Valve annuloplasty.  Atherosclerotic calcifications of the aortic arch.  Pacemaker leads.  Evaluation of the lung parenchyma is constrained by respiratory motion.  Scattered patchy left upper lobe opacities. Mild right middle lobe opacity /  scarring.  Small bilateral pleural effusions with associated lower lobe atelectasis.  IMPRESSION: No fracture or dislocation is seen.  Mild multilevel degenerative changes, most prominent at T11-12.  Left paracentral disc herniation at C5-6.  Evaluation of the lung parenchyma is constrained by respiratory motion.  Patchy left upper lobe and right middle lobe opacities. Small bilateral pleural effusions with associated lower lobe atelectasis.  CT LUMBAR SPINE  Findings: No fracture or dislocation is seen.  Very mild multilevel degenerative changes.  Atherosclerotic calcifications of the abdominal aorta and branch vessels.  Suspected calcified uterine fibroids.  IMPRESSION: No fracture or dislocation is seen.  Very mild multilevel degenerative changes.  Original Report Authenticated By: Charline Bills, M.D.   Dg Chest Port 1 View  09/17/2011  *RADIOLOGY REPORT*  Clinical Data: Respiratory failure, shortness of breath  PORTABLE CHEST - 1 VIEW  Comparison: 09/11/2011; 09/09/2011; 09/08/2011; chest CT - 09/10/2011  Findings:  Grossly unchanged enlarged cardiac silhouette and mediastinal contours post median sternotomy and valve replacement given decreased lung volumes.  Grossly unchanged positioning of single lead pacemaker with tip overlying the expected location of the right ventricle.  Decreased lung volumes with corresponding interval increase in right perihilar heterogeneous opacities. Cephalization of flow with thickening along the right minor fissure.  No definite pleural effusion or pneumothorax.  Unchanged bones.  IMPRESSION: 1.  Decreased lung volumes with interval increase in  right perihilar opacities to a represent atelectasis. 2.  Query mild pulmonary edema.  Original Report Authenticated By: Waynard Reeds, M.D.   Dg Chest Port 1 View  09/11/2011  *RADIOLOGY REPORT*  Clinical Data: Respiratory failure  PORTABLE CHEST - 1 VIEW  Comparison: 09/09/2011  Findings: Heart is moderately enlarged.   Single lead left subclavian pacemaker device unchanged.  Low volumes and hazy airspace opacities improved.  Stable mid lung right-sided linear opacity.  Normal pulmonary vascularity.  IMPRESSION: Improved aeration and airspace opacities.  Original Report Authenticated By: Donavan Burnet, M.D.   Dg Chest Port 1 View  09/09/2011  *RADIOLOGY REPORT*  Clinical Data: No edema, evaluate endotracheal tube position  PORTABLE CHEST - 1 VIEW  Comparison: Portable chest x-ray of 09/08/2011  Findings: No endotracheal tube is visible.  There is little change in poor aeration and basilar volume loss.  Cardiomegaly is stable. 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  IMPRESSION: No endotracheal tube.  No change in poor aeration with basilar volume loss.  Original Report Authenticated By: Juline Patch, M.D.   Dg Chest Port 1 View  09/08/2011  *RADIOLOGY REPORT*  Clinical Data: Respiratory distress.  PORTABLE CHEST - 1 VIEW  Comparison: 09/06/2011  Findings: Cardiomegaly.  Mild vascular congestion.  Interstitial prominence could reflect interstitial edema.  This is increased slightly since prior study.  No effusions.  No acute bony abnormality.  IMPRESSION: Increasing interstitial prominence, question interstitial edema.  Original Report Authenticated By: Cyndie Chime, M.D.   Medications: Scheduled Meds:    . cefTRIAXone (ROCEPHIN)  IV  2 g Intravenous Q12H  . digoxin  0.125 mg Oral Daily  . famotidine  20 mg Oral BID  . HYDROmorphone PCA 0.3 mg/mL   Intravenous Q4H  . lacosamide (VIMPAT) IV  100 mg Intravenous Q12H  . metoprolol tartrate  25 mg Oral BID  . potassium chloride  10 mEq Intravenous Q1 Hr x 3  . warfarin  7.5 mg Oral Once  . warfarin  7.5 mg Oral ONCE-1800   Continuous Infusions:    . sodium chloride 20 mL/hr at 09/19/11 0921  . heparin 1,600 Units/hr (09/20/11 1030)  . DISCONTD: heparin 1,700 Units/hr (09/20/11 0127)   PRN Meds:.sodium chloride, acetaminophen,  diphenhydrAMINE, diphenhydrAMINE, naloxone, ondansetron (ZOFRAN) IV, polyethylene glycol, sodium chloride  Assessment/Plan: #1 Encephalopathy acute-most likely secondary to [facility] #2 Atrial fibrillation with rapid ventricular response-ventricular rate is controlled. 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

## 2011-09-21 ENCOUNTER — Ambulatory Visit (HOSPITAL_COMMUNITY): Payer: Medicare Other

## 2011-09-21 LAB — CBC
MCH: 29.8 pg (ref 26.0–34.0)
MCV: 85.7 fL (ref 78.0–100.0)
Platelets: 244 10*3/uL (ref 150–400)
RDW: 14.8 % (ref 11.5–15.5)
WBC: 14.3 10*3/uL — ABNORMAL HIGH (ref 4.0–10.5)

## 2011-09-21 LAB — COMPREHENSIVE METABOLIC PANEL
BUN: 12 mg/dL (ref 6–23)
CO2: 21 mEq/L (ref 19–32)
Calcium: 9.2 mg/dL (ref 8.4–10.5)
Creatinine, Ser: 0.64 mg/dL (ref 0.50–1.10)
GFR calc Af Amer: >90 mL/min (ref >90)
GFR calc non Af Amer: 89 mL/min — ABNORMAL LOW (ref >90)
Glucose, Bld: 117 mg/dL — ABNORMAL HIGH (ref 70–99)

## 2011-09-21 LAB — PROTIME-INR: INR: 1.35 (ref 0.00–1.49)

## 2011-09-21 MED ORDER — WARFARIN SODIUM 7.5 MG PO TABS
7.5000 mg | ORAL_TABLET | Freq: Once | ORAL | Status: AC
  Administered 2011-09-21: 7.5 mg via ORAL
  Filled 2011-09-21: qty 1

## 2011-09-21 NOTE — Progress Notes (Signed)
Patient ID: Carrie [name], female   DOB: [birth year], 70 y.o.   MRN: [id number]  Subjective: Patient asking about going home, how long treatment will last.Confusion seems to be improving.  Objective: Vital signs in last 24 hours: Temp:  [97.5 F (36.4 C)-100.2 F (37.9 C)] 98.2 F (36.8 C) (12/31 0602) Pulse Rate:  [59-115] 67  (12/31 0602) Resp:  [16-22] 17  (12/31 0602) BP: (111-154)/(65-83) 132/66 mmHg (12/31 0602) SpO2:  [92 %-100 %] 97 % (12/31 0602) Weight change:  Last BM Date: 09/20/11  Filed Vitals:   09/21/11 0145 09/21/11 0211 09/21/11 0401 09/21/11 0602  BP: 154/83   132/66  Pulse: 59   67  Temp: 97.5 F (36.4 C)   98.2 F (36.8 C)  TempSrc: Oral   Oral  Resp: 19 18 18 17   Height:      Weight:      SpO2: 92% 100% 100% 97%   Intake/Output from previous day: 12/30 0701 - 12/31 0700 In: 1197 [P.O.:240; I.V.:700; IV Piggyback:257] Out: 1200 [Urine:1200] Intake/Output this shift:   Physical Examination:  CV: SYSTOLIC MURMUR AND MECH CLICK.  Mental Status Exam: oriented to 2012, name, dob, Pres. Obama but still have difficulty carrying on a conversation, tends to repeat herself, Cranial Nerve Exam: PUPILS equal reactive.  EOMI.  Facial symmetry is present. Extraocular movements are full. Visual fields shows some decrease blink to threat on the right   Motor Exam: The patient moves all 4 extremities  Deep tendon reflexes: Deep tendon reflexes are symmetric throughout. Toes are downgoing bilaterally.  Lab Results:  [facility] 09/21/11 0615 09/20/11 0625  WBC 14.3* 15.6*  HGB 10.8* 11.2*  HCT 31.1* 32.2*  PLT 244 255   BMET  Basename 09/21/11 0615 09/20/11 0918  NA 134* 132*  K 3.6 3.8  CL 101 99  CO2 21 20  GLUCOSE 117* 103*  BUN 12 11  CREATININE 0.64 0.64  CALCIUM 9.2 9.5   INR 1.35  Studies/Results: No results found.  Medications:  Scheduled:    . cefTRIAXone (ROCEPHIN)  IV  2 g Intravenous Q12H  . digoxin  0.125 mg Oral Daily  .  famotidine  20 mg Oral BID  . HYDROmorphone PCA 0.3 mg/mL   Intravenous Q4H  . lacosamide (VIMPAT) IV  100 mg Intravenous Q12H  . metoprolol tartrate  25 mg Oral BID  . warfarin  7.5 mg Oral ONCE-1800   Continuous:    . sodium chloride 20 mL/hr at 09/20/11 1125  . heparin 1,600 Units/hr (09/20/11 1654)  . DISCONTD: heparin 1,700 Units/hr (09/20/11 0127)   OZH:YQMVHQ chloride, acetaminophen, diphenhydrAMINE, diphenhydrAMINE, naloxone, ondansetron (ZOFRAN) IV, polyethylene glycol, sodium chloride  Assessment/Plan: 1. Pneumococcal bacteremia and prosthetic valve endocarditis -cont current antibiotics as directed by ID -Cardiology following - repeat TEE planned for Wed 09/22/2010  -TCTS following - likely redo MVR eventually  2. Small subarachnoid hemorrhage (suspect post-traumatic vs. septic emboli), now resolved, back to heparin drip now,  Also on coumadin. Tolerating well.  3. Delirium, it was described during initial presentation, DDx, complex partial seizure vs encephalopathy, EEG pending. Empirically treating with vimpat now, continues to have  improvement.  3. Hypertension  4. Atrial fibrillation  Follow up EEG.   LOS: 15 days   SHARON BIBY, AVNP, ANP-BC, GNP-BC [facility] Pager: [phone] 09/21/2011 8:20 AM  Dr. Delia Heady, Stroke Center Medical Director, has personally reviewed chart, pertinent data, examined the patient and developed the plan of care. Marland Kitchen

## 2011-09-21 NOTE — Progress Notes (Signed)
ANTICOAGULATION CONSULT NOTE - Follow Up Consult  Pharmacy Consult for heparin and coumadin Indication: St Jude MVR  Allergies  Allergen Reactions  . Codeine Nausea And Vomiting    Patient Measurements: Height: 5\' 1"  (154.9 cm) Weight: 166 lb 14.2 oz (75.7 kg) IBW/kg (Calculated) : 47.8   Vital Signs: Temp: 98.2 F (36.8 C) (12/31 0602) Temp src: Oral (12/31 0602) BP: 132/66 mmHg (12/31 0602) Pulse Rate: 94  (12/31 0950)  Labs:  Basename 09/21/11 0615 09/20/11 0918 09/20/11 0625 09/19/11 2025 09/19/11 1614  HGB 10.8* -- 11.2* -- --  HCT 31.1* -- 32.2* 32.0* --  PLT 244 -- 255 257 --  APTT -- -- -- -- --  LABPROT 16.9* -- -- -- 17.0*  INR 1.35 -- -- -- 1.36  HEPARINUNFRC 0.49 -- 0.69 0.59 --  CREATININE 0.64 0.64 -- 0.72 --  CKTOTAL -- -- -- -- --  CKMB -- -- -- -- --  TROPONINI -- -- -- -- --   Estimated Creatinine Clearance: 61.8 ml/min (by C-G formula based on Cr of 0.64).   Medications:  Scheduled:     . cefTRIAXone (ROCEPHIN)  IV  2 g Intravenous Q12H  . digoxin  0.125 mg Oral Daily  . famotidine  20 mg Oral BID  . HYDROmorphone PCA 0.3 mg/mL   Intravenous Q4H  . lacosamide (VIMPAT) IV  100 mg Intravenous Q12H  . metoprolol tartrate  25 mg Oral BID  . warfarin  7.5 mg Oral ONCE-1800    Assessment: Heparin level 0.49. Restarted on Coumadin 12.29 Goal of Therapy:  Heparin level 0.3-0.7 units/ml INR 2.5-3.5  Plan:  Continue heparin at 1600 units/hr.  Will give coumadin 7.5 mg and f/u INR.  Ambre Kobayashi Poteet 09/21/2011,1:41 PM

## 2011-09-21 NOTE — Progress Notes (Signed)
Patient ID: Carrie [name], female   DOB: Nov 29, 1940, 71 y.o.   MRN: [id number] Patient ID: Carrie [name], female   DOB: 07/31/1941, 71 y.o.   MRN: [id number] Patient ID: Carrie [name], female   DOB: [birth year], 70 y.o.   MRN: [id number] Subjective: Patient  seen. Night uneventful. Denies any specific complaint. Currently on IV heparin as well as Coumadin.  Objective: Weight change:   Intake/Output Summary (Last 24 hours) at 09/21/11 0836 Last data filed at 09/21/11 0600  Gross per 24 hour  Intake   1197 ml  Output   1200 ml  Net     -3 ml   BP 132/66  Pulse 67  Temp(Src) 98.2 F (36.8 C) (Oral)  Resp 17  Ht 5\' 1"  (1.549 m)  Wt 75.7 kg (166 lb 14.2 oz)  BMI 31.53 kg/m2  SpO2 97% Physical Exam: General appearance: alert, cooperative and no distress,pallor Head: Normocephalic, without obvious abnormality, atraumatic Neck: no adenopathy, no carotid bruit, no JVD, supple, symmetrical, trachea midline and thyroid not enlarged, symmetric, no tenderness/mass/nodules Lungs: clear to auscultation bilaterally Heart: irregularly irregular Abdomen: soft, non-tender; bowel sounds normal; no masses,  no organomegaly Extremities: extremities normal, atraumatic, no cyanosis or edema Skin: Skin color, texture, turgor normal. No rashes or lesions Neurology-no lateralizing signs.  Lab Results: Results for orders placed during the hospital encounter of 09/06/11 (from the past 48 hour(s))  PROTIME-INR     Status: Abnormal   Collection Time   09/19/11  4:14 PM      Component Value Range Comment   Prothrombin Time 17.0 (*) 11.6 - 15.2 (seconds)    INR 1.36  0.00 - 1.49    HEPARIN LEVEL (UNFRACTIONATED)     Status: Normal   Collection Time   09/19/11  8:25 PM      Component Value Range Comment   Heparin Unfractionated 0.59  0.30 - 0.70 (IU/mL)   CBC     Status: Abnormal   Collection Time   09/19/11  8:25 PM      Component Value Range Comment   WBC 16.5 (*) 4.0 - 10.5 (K/uL)    RBC 3.77 (*)  3.87 - 5.11 (MIL/uL)    Hemoglobin 11.4 (*) 12.0 - 15.0 (g/dL)    HCT 08.6 (*) 57.8 - 46.0 (%)    MCV 84.9  78.0 - 100.0 (fL)    MCH 30.2  26.0 - 34.0 (pg)    MCHC 35.6  30.0 - 36.0 (g/dL)    RDW 46.9  62.9 - 52.8 (%)    Platelets 257  150 - 400 (K/uL)   COMPREHENSIVE METABOLIC PANEL     Status: Abnormal   Collection Time   09/19/11  8:25 PM      Component Value Range Comment   Sodium 129 (*) 135 - 145 (mEq/L)    Potassium 3.3 (*) 3.5 - 5.1 (mEq/L)    Chloride 100  96 - 112 (mEq/L)    CO2 21  19 - 32 (mEq/L)    Glucose, Bld 127 (*) 70 - 99 (mg/dL)    BUN 14  6 - 23 (mg/dL)    Creatinine, Ser 4.13  0.50 - 1.10 (mg/dL)    Calcium 9.3  8.4 - 10.5 (mg/dL)    Total Protein 8.2  6.0 - 8.3 (g/dL)    Albumin 2.3 (*) 3.5 - 5.2 (g/dL)    AST 244 (*) 0 - 37 (U/L)    ALT 205 (*) 0 - 35 (U/L)  Alkaline Phosphatase 172 (*) 39 - 117 (U/L)    Total Bilirubin 0.3  0.3 - 1.2 (mg/dL)    GFR calc non Af Amer 86 (*) >90 (mL/min)    GFR calc Af Amer >90  >90 (mL/min)   CBC     Status: Abnormal   Collection Time   09/20/11  6:25 AM      Component Value Range Comment   WBC 15.6 (*) 4.0 - 10.5 (K/uL)    RBC 3.77 (*) 3.87 - 5.11 (MIL/uL)    Hemoglobin 11.2 (*) 12.0 - 15.0 (g/dL)    HCT 16.1 (*) 09.6 - 46.0 (%)    MCV 85.4  78.0 - 100.0 (fL)    MCH 29.7  26.0 - 34.0 (pg)    MCHC 34.8  30.0 - 36.0 (g/dL)    RDW 04.5  40.9 - 81.1 (%)    Platelets 255  150 - 400 (K/uL)   HEPARIN LEVEL (UNFRACTIONATED)     Status: Normal   Collection Time   09/20/11  6:25 AM      Component Value Range Comment   Heparin Unfractionated 0.69  0.30 - 0.70 (IU/mL)   COMPREHENSIVE METABOLIC PANEL     Status: Abnormal   Collection Time   09/20/11  9:18 AM      Component Value Range Comment   Sodium 132 (*) 135 - 145 (mEq/L)    Potassium 3.8  3.5 - 5.1 (mEq/L)    Chloride 99  96 - 112 (mEq/L)    CO2 20  19 - 32 (mEq/L)    Glucose, Bld 103 (*) 70 - 99 (mg/dL)    BUN 11  6 - 23 (mg/dL)    Creatinine, Ser 9.14  0.50  - 1.10 (mg/dL)    Calcium 9.5  8.4 - 10.5 (mg/dL)    Total Protein 8.8 (*) 6.0 - 8.3 (g/dL)    Albumin 2.4 (*) 3.5 - 5.2 (g/dL)    AST 782 (*) 0 - 37 (U/L)    ALT 199 (*) 0 - 35 (U/L)    Alkaline Phosphatase 148 (*) 39 - 117 (U/L)    Total Bilirubin 0.5  0.3 - 1.2 (mg/dL)    GFR calc non Af Amer 89 (*) >90 (mL/min)    GFR calc Af Amer >90  >90 (mL/min)   CBC     Status: Abnormal   Collection Time   09/21/11  6:15 AM      Component Value Range Comment   WBC 14.3 (*) 4.0 - 10.5 (K/uL)    RBC 3.63 (*) 3.87 - 5.11 (MIL/uL)    Hemoglobin 10.8 (*) 12.0 - 15.0 (g/dL)    HCT 95.6 (*) 21.3 - 46.0 (%)    MCV 85.7  78.0 - 100.0 (fL)    MCH 29.8  26.0 - 34.0 (pg)    MCHC 34.7  30.0 - 36.0 (g/dL)    RDW 08.6  57.8 - 46.9 (%)    Platelets 244  150 - 400 (K/uL)   HEPARIN LEVEL (UNFRACTIONATED)     Status: Normal   Collection Time   09/21/11  6:15 AM      Component Value Range Comment   Heparin Unfractionated 0.49  0.30 - 0.70 (IU/mL)   PROTIME-INR     Status: Abnormal   Collection Time   09/21/11  6:15 AM      Component Value Range Comment   Prothrombin Time 16.9 (*) 11.6 - 15.2 (seconds)    INR 1.35  0.00 -  1.49    COMPREHENSIVE METABOLIC PANEL     Status: Abnormal   Collection Time   09/21/11  6:15 AM      Component Value Range Comment   Sodium 134 (*) 135 - 145 (mEq/L)    Potassium 3.6  3.5 - 5.1 (mEq/L)    Chloride 101  96 - 112 (mEq/L)    CO2 21  19 - 32 (mEq/L)    Glucose, Bld 117 (*) 70 - 99 (mg/dL)    BUN 12  6 - 23 (mg/dL)    Creatinine, Ser 2.13  0.50 - 1.10 (mg/dL)    Calcium 9.2  8.4 - 10.5 (mg/dL)    Total Protein 8.2  6.0 - 8.3 (g/dL)    Albumin 2.2 (*) 3.5 - 5.2 (g/dL)    AST 086 (*) 0 - 37 (U/L)    ALT 173 (*) 0 - 35 (U/L)    Alkaline Phosphatase 142 (*) 39 - 117 (U/L)    Total Bilirubin 0.3  0.3 - 1.2 (mg/dL)    GFR calc non Af Amer 89 (*) >90 (mL/min)    GFR calc Af Amer >90  >90 (mL/min)     Micro Results: No results found for this or any previous visit  (from the past 240 hour(s)).  Studies/Results: Ct Angio Head W/cm &/or Wo Cm  09/08/2011  *RADIOLOGY REPORT*  Clinical Data:  2-week history of fever and malaise.  Possible right common carotid artery dissections seen on CT cervical spine.  CT ANGIOGRAPHY HEAD AND NECK  Technique:  Multidetector CT imaging of the head and neck was performed using the standard protocol during bolus administration of intravenous contrast.  Multiplanar CT image reconstructions including MIPs were obtained to evaluate the vascular anatomy. Carotid stenosis measurements (when applicable) are obtained utilizing NASCET criteria, using the distal internal carotid diameter as the denominator.  Contrast: OMNIPAQUE IOHEXOL 350 MG/ML IV SOLN an  Comparison:  CT head and C-spine earlier today.  CTA NECK  Findings:  There is reflux of contrast into the left internal jugular vein.  This may be related to the left sided pacemaker placement.  There is fair opacification of the craniocerebral vasculature.  Atheromatous change can be seen in the transverse arch.  There is considerable Hounsfield artifact from the pacemaker battery pack. There is no visible proximal great vessel dissection or stenosis.  Both carotid bifurcations are widely patent.  Mild nonstenotic calcific atheromatous change can be seen on the right.  Cervical internal carotid arteries tortuous but patent without dissection. Both vertebrals are patent and equal in size through the neck. Possible 15 x 16 mm right thyroid nodule.  Consider thyroid ultrasound for further evaluation.  Lung apices clear.   Review of the MIP images confirms the above findings.  IMPRESSION: No visible proximal great vessel dissection within limits of detection by artifact from pacemaker battery pack.  No evidence for flow reducing lesion in the right or left carotid system.  CTA HEAD  Findings:  Calcific nonstenotic atheromatous change of the skull base, cavernous, and supraclinoid internal  carotid arteries.  No visible proximal stenosis of the anterior, middle, or posterior cerebral arteries.  Basilar artery patent without focal stenosis. No visible intracranial aneurysm.  Post infusion imaging of the head reveals no abnormal enhancement.  Chronic left basal ganglia infarct redemonstrated.  Left frontal subarachnoid blood appears slightly greater than was seen earlier. It is possible a component of this increased conspicuity relates to enhancement postcontrast. Recommend repeat noncontrast examination for  continued surveillance.   Review of the MIP images confirms the above findings.  IMPRESSION: No proximal carotid or basilar stenosis.  No visible intracranial dissection.  Suspect slight increase in left frontal subarachnoid blood. I cannot completely exclude that this increased prominence relates to a component of enhancement Recommend repeat noncontrast imaging in 24-48 hours.  Original Report Authenticated By: Elsie Stain, M.D.   Dg Chest 2 View  09/06/2011  *RADIOLOGY REPORT*  Clinical Data: Rule out infiltrate. Confusion.  CHEST - 2 VIEW  Comparison: 02/20/2008  Findings:  Heart size appears mildly enlarged.  Prior median sternotomy CABG procedure.  Left chest wall pacer device is noted with lead in the right ventricle.  There are no pleural effusions or interstitial edema.  Chronic bronchitic changes are noted bilaterally.  IMPRESSION:  1.  Cardiac enlargement. 2.  No acute changes.  Original Report Authenticated By: Rosealee Albee, M.D.   Ct Head Wo Contrast  09/16/2011  *RADIOLOGY REPORT*  Clinical Data: Follow up subarachnoid hemorrhage  CT HEAD WITHOUT CONTRAST  Technique:  Contiguous axial images were obtained from the base of the skull through the vertex without contrast.  Comparison: 09/13/2011  Findings: Near complete resolution of prior left frontal subarachnoid hemorrhage.  No parenchymal hemorrhage or new extra- axial hemorrhage is seen.  No mass lesion, mass effect, or  midline shift.  Subcortical white matter and periventricular small vessel ischemic changes.  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  Mild global cortical atrophy.  No ventriculomegaly.  The visualized paranasal sinuses are essentially clear. The mastoid air cells are unopacified.  No evidence of calvarial fracture.  IMPRESSION: No evidence of acute intracranial abnormality.  Near complete resolution of prior left frontal subarachnoid hemorrhage.  Original Report Authenticated By: Charline Bills, M.D.   Ct Head Wo Contrast  09/13/2011  *RADIOLOGY REPORT*  Clinical Data: New onset headache and neck soreness.  CT HEAD WITHOUT CONTRAST  Technique:  Contiguous axial images were obtained from the base of the skull through the vertex without contrast.  Comparison: Head CT scans 09/10/2011 and 09/09/2011.  Findings: Small amount of subarachnoid hemorrhage in the left frontal lobe continues to resolve.  No new hemorrhage is identified.  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  No midline shift, hydrocephalus or abnormal extra-axial fluid collection is identified.  IMPRESSION:  1.  No acute finding. 2.  Resolving subarachnoid hemorrhage on the left.  Original Report Authenticated By: Bernadene Bell. Maricela Curet, M.D.   Ct Head Wo Contrast  09/10/2011  *RADIOLOGY REPORT*  Clinical Data: Follow up subarachnoid hemorrhage  CT HEAD WITHOUT CONTRAST  Technique:  Contiguous axial images were obtained from the base of the skull through the vertex without contrast.  Comparison: 09/09/2011  Findings: Hyperdense subarachnoid hemorrhage over the convexity left frontal region has improved.  No new areas of hemorrhage are identified.  No subdural hemorrhage is present.  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  No acute infarct.  Negative for mass lesion.  IMPRESSION: Small areas of subarachnoid hemorrhage over the convexity on the left have improved.  No new area of hemorrhage or infarction.   Original Report Authenticated By: Camelia Phenes, M.D.   Ct Head Wo Contrast  09/09/2011  *RADIOLOGY REPORT*  Clinical Data: Followup subarachnoid hemorrhage.  CT HEAD WITHOUT CONTRAST  Technique:  Contiguous axial images were obtained from the base of the skull through the vertex without contrast.  Comparison: 09/07/2011 CT and 11/10/2005 MR.  Findings: Compared to the 09/07/2011  noncontrast CT examination. There has been an increase in amount of subarachnoid blood in the left frontal region.  Etiology of this is indeterminate.  Result of venous thrombosis / infarct not excluded.  Nonspecific white matter type changes most notable left frontal region.  Remote left caudate head infarct with encephalomalacia. This is new compared to 2007 MR. No CT evidence of large acute infarct.  Small acute infarct cannot be excluded by CT.  No intracranial mass lesion detected on this unenhanced exam.  IMPRESSION: Compared to the 09/07/2011 noncontrast CT examination.  There has been an increase in amount of subarachnoid blood in the left frontal region.  Etiology of this is indeterminate. Please see above.  Original Report Authenticated By: Fuller Canada, M.D.   Ct Head Wo Contrast  09/07/2011  *RADIOLOGY REPORT*  Clinical Data: Headaches  CT HEAD WITHOUT CONTRAST  Technique:  Contiguous axial images were obtained from the base of the skull through the vertex without contrast.  Comparison: [facility] brain dated 11/10/2005  Findings: Tiny hyperdense foci overlying the left frontal lobe (series 2/images 14 and 22) and left medial frontal lobe (series 2/image 19), possibly reflecting subarachnoid hemorrhage.  No mass lesion, mass effect, or midline shift.  No CT evidence of acute infarction.  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  Subcortical white matter and periventricular small vessel ischemic changes.  Mildly age related atrophy.  No ventriculomegaly.  The visualized paranasal sinuses are essentially clear. The  mastoid air cells are unopacified.  No evidence of calvarial fracture.  IMPRESSION: Possible tiny foci of subarachnoid hemorrhage overlying the left frontal lobe, as described above.  Old lacunar infarct with small vessel ischemic changes  Critical Value/emergent results were called by telephone at the time of interpretation on 09/07/2011  at 1810 hours  to  Dr Delford Field, who verbally acknowledged these results.  Original Report Authenticated By: Charline Bills, M.D.   Ct Angio Neck W/cm &/or Wo/cm  09/08/2011  *RADIOLOGY REPORT*  Clinical Data:  2-week history of fever and malaise.  Possible right common carotid artery dissections seen on CT cervical spine.  CT ANGIOGRAPHY HEAD AND NECK  Technique:  Multidetector CT imaging of the head and neck was performed using the standard protocol during bolus administration of intravenous contrast.  Multiplanar CT image reconstructions including MIPs were obtained to evaluate the vascular anatomy. Carotid stenosis measurements (when applicable) are obtained utilizing NASCET criteria, using the distal internal carotid diameter as the denominator.  Contrast: OMNIPAQUE IOHEXOL 350 MG/ML IV SOLN an  Comparison:  CT head and C-spine earlier today.  CTA NECK  Findings:  There is reflux of contrast into the left internal jugular vein.  This may be related to the left sided pacemaker placement.  There is fair opacification of the craniocerebral vasculature.  Atheromatous change can be seen in the transverse arch.  There is considerable Hounsfield artifact from the pacemaker battery pack. There is no visible proximal great vessel dissection or stenosis.  Both carotid bifurcations are widely patent.  Mild nonstenotic calcific atheromatous change can be seen on the right.  Cervical internal carotid arteries tortuous but patent without dissection. Both vertebrals are patent and equal in size through the neck. Possible 15 x 16 mm right thyroid nodule.  Consider thyroid ultrasound  for further evaluation.  Lung apices clear.   Review of the MIP images confirms the above findings.  IMPRESSION: No visible proximal great vessel dissection within limits of detection by artifact from pacemaker battery pack.  No evidence for flow reducing lesion in the right or left carotid system.  CTA HEAD  Findings:  Calcific nonstenotic atheromatous change of the skull base, cavernous, and supraclinoid internal carotid arteries.  No visible proximal stenosis of the anterior, middle, or posterior cerebral arteries.  Basilar artery patent without focal stenosis. No visible intracranial aneurysm.  Post infusion imaging of the head reveals no abnormal enhancement.  Chronic left basal ganglia infarct redemonstrated.  Left frontal subarachnoid blood appears slightly greater than was seen earlier. It is possible a component of this increased conspicuity relates to enhancement postcontrast. Recommend repeat noncontrast examination for continued surveillance.   Review of the MIP images confirms the above findings.  IMPRESSION: No proximal carotid or basilar stenosis.  No visible intracranial dissection.  Suspect slight increase in left frontal subarachnoid blood. I cannot completely exclude that this increased prominence relates to a component of enhancement Recommend repeat noncontrast imaging in 24-48 hours.  Original Report Authenticated By: Elsie Stain, M.D.   Ct Cervical Spine W Contrast  09/07/2011  *RADIOLOGY REPORT*  Clinical Data: C-spine tenderness, evaluate for fracture or abscess  CT CERVICAL SPINE WITH CONTRAST  Technique:  Multidetector CT imaging of the cervical spine was performed during intravenous contrast administration. Multiplanar CT image reconstructions were also generated.  Contrast: OMNIPAQUE IOHEXOL 300 MG/ML IV SOLN  Comparison: None.  Findings: Normal cervical lordosis.  No evidence of fracture or dislocation.  Vertebral body heights are maintained.  The dens appears intact.  No  prevertebral soft tissue swelling.  Mild multilevel degenerative changes.  No epidural abscess/fluid collection is seen.  There is a possible linear filling defect within the right common carotid artery (series 606/image 69, series 605/image 4), and although this may reflect artifact at the thoracic inlet, a focal dissection cannot be excluded.  IMPRESSION: Apparent linear filling defect within the right common carotid artery, possibly artifactual, although focal dissection cannot be excluded.  Consider CTA/MRA neck for further characterization as clinically warranted.  No evidence of fracture or dislocation.  No epidural abscess/fluid collection is seen.  Critical Value/emergent results were called by telephone at the time of interpretation on 09/07/2011  at 1810 hours  to  Dr Delford Field, who verbally acknowledged these results.  Original Report Authenticated By: Charline Bills, M.D.   Ct Thoracic Spine Wo Contrast  09/10/2011  *RADIOLOGY REPORT*  Clinical Data:  Leg weakness  CT THORACIC AND LUMBAR SPINE WITHOUT CONTRAST  Technique:  Multidetector CT imaging of the thoracic and lumbar spine was performed without contrast. Multiplanar CT image reconstructions were also generated.  Comparison:  None  CT THORACIC SPINE  Findings:  No fracture or dislocation is seen.  Mild multilevel degenerative changes of the thoracic spine, most prominent at T11-12.  Lower cervical spine is incompletely imaged.  Left paracentral disc herniation at C5-6 (series 9/image 14).  Visualized thyroid is unremarkable.  Cardiomegaly.  Coronary atherosclerosis.  Valve annuloplasty.  Atherosclerotic calcifications of the aortic arch.  Pacemaker leads.  Evaluation of the lung parenchyma is constrained by respiratory motion.  Scattered patchy left upper lobe opacities. Mild right middle lobe opacity / scarring.  Small bilateral pleural effusions with associated lower lobe atelectasis.  IMPRESSION: No fracture or dislocation is seen.  Mild  multilevel degenerative changes, most prominent at T11-12.  Left paracentral disc herniation at C5-6.  Evaluation of the lung parenchyma is constrained by respiratory motion.  Patchy left upper lobe and right middle lobe opacities. Small bilateral pleural effusions with associated lower lobe  atelectasis.  CT LUMBAR SPINE  Findings: No fracture or dislocation is seen.  Very mild multilevel degenerative changes.  Atherosclerotic calcifications of the abdominal aorta and branch vessels.  Suspected calcified uterine fibroids.  IMPRESSION: No fracture or dislocation is seen.  Very mild multilevel degenerative changes.  Original Report Authenticated By: Charline Bills, M.D.   Ct Lumbar Spine Wo Contrast  09/10/2011  *RADIOLOGY REPORT*  Clinical Data:  Leg weakness  CT THORACIC AND LUMBAR SPINE WITHOUT CONTRAST  Technique:  Multidetector CT imaging of the thoracic and lumbar spine was performed without contrast. Multiplanar CT image reconstructions were also generated.  Comparison:  None  CT THORACIC SPINE  Findings:  No fracture or dislocation is seen.  Mild multilevel degenerative changes of the thoracic spine, most prominent at T11-12.  Lower cervical spine is incompletely imaged.  Left paracentral disc herniation at C5-6 (series 9/image 14).  Visualized thyroid is unremarkable.  Cardiomegaly.  Coronary atherosclerosis.  Valve annuloplasty.  Atherosclerotic calcifications of the aortic arch.  Pacemaker leads.  Evaluation of the lung parenchyma is constrained by respiratory motion.  Scattered patchy left upper lobe opacities. Mild right middle lobe opacity / scarring.  Small bilateral pleural effusions with associated lower lobe atelectasis.  IMPRESSION: No fracture or dislocation is seen.  Mild multilevel degenerative changes, most prominent at T11-12.  Left paracentral disc herniation at C5-6.  Evaluation of the lung parenchyma is constrained by respiratory motion.  Patchy left upper lobe and right middle lobe  opacities. Small bilateral pleural effusions with associated lower lobe atelectasis.  CT LUMBAR SPINE  Findings: No fracture or dislocation is seen.  Very mild multilevel degenerative changes.  Atherosclerotic calcifications of the abdominal aorta and branch vessels.  Suspected calcified uterine fibroids.  IMPRESSION: No fracture or dislocation is seen.  Very mild multilevel degenerative changes.  Original Report Authenticated By: Charline Bills, M.D.   Dg Chest Port 1 View  09/17/2011  *RADIOLOGY REPORT*  Clinical Data: Respiratory failure, shortness of breath  PORTABLE CHEST - 1 VIEW  Comparison: 09/11/2011; 09/09/2011; 09/08/2011; chest CT - 09/10/2011  Findings:  Grossly unchanged enlarged cardiac silhouette and mediastinal contours post median sternotomy and valve replacement given decreased lung volumes.  Grossly unchanged positioning of single lead pacemaker with tip overlying the expected location of the right ventricle.  Decreased lung volumes with corresponding interval increase in right perihilar heterogeneous opacities. Cephalization of flow with thickening along the right minor fissure.  No definite pleural effusion or pneumothorax.  Unchanged bones.  IMPRESSION: 1.  Decreased lung volumes with interval increase in right perihilar opacities to a represent atelectasis. 2.  Query mild pulmonary edema.  Original Report Authenticated By: Waynard Reeds, M.D.   Dg Chest Port 1 View  09/11/2011  *RADIOLOGY REPORT*  Clinical Data: Respiratory failure  PORTABLE CHEST - 1 VIEW  Comparison: 09/09/2011  Findings: Heart is moderately enlarged.  Single lead left subclavian pacemaker device unchanged.  Low volumes and hazy airspace opacities improved.  Stable mid lung right-sided linear opacity.  Normal pulmonary vascularity.  IMPRESSION: Improved aeration and airspace opacities.  Original Report Authenticated By: Donavan Burnet, M.D.   Dg Chest Port 1 View  09/09/2011  *RADIOLOGY REPORT*  Clinical  Data: No edema, evaluate endotracheal tube position  PORTABLE CHEST - 1 VIEW  Comparison: Portable chest x-ray of 09/08/2011  Findings: No endotracheal tube is visible.  There is little change in poor aeration and basilar volume loss.  Cardiomegaly is stable. A permanent pacemaker is noted with  a single lead.  IMPRESSION: No endotracheal tube.  No change in poor aeration with basilar volume loss.  Original Report Authenticated By: Juline Patch, M.D.   Dg Chest Port 1 View  09/08/2011  *RADIOLOGY REPORT*  Clinical Data: Respiratory distress.  PORTABLE CHEST - 1 VIEW  Comparison: 09/06/2011  Findings: Cardiomegaly.  Mild vascular congestion.  Interstitial prominence could reflect interstitial edema.  This is increased slightly since prior study.  No effusions.  No acute bony abnormality.  IMPRESSION: Increasing interstitial prominence, question interstitial edema.  Original Report Authenticated By: Cyndie Chime, M.D.   Medications: Scheduled Meds:    . cefTRIAXone (ROCEPHIN)  IV  2 g Intravenous Q12H  . digoxin  0.125 mg Oral Daily  . famotidine  20 mg Oral BID  . HYDROmorphone PCA 0.3 mg/mL   Intravenous Q4H  . lacosamide (VIMPAT) IV  100 mg Intravenous Q12H  . metoprolol tartrate  25 mg Oral BID  . warfarin  7.5 mg Oral ONCE-1800   Continuous Infusions:    . sodium chloride 20 mL/hr at 09/20/11 1125  . heparin 1,600 Units/hr (09/20/11 1654)  . DISCONTD: heparin 1,700 Units/hr (09/20/11 0127)   PRN Meds:.sodium chloride, acetaminophen, diphenhydrAMINE, diphenhydrAMINE, naloxone, ondansetron (ZOFRAN) IV, polyethylene glycol, sodium chloride  Assessment/Plan: #1 Acute Encephalopathy-most likely secondary to [facility].mentation is improving markedly.  #2 Atrial fibrillation with rapid ventricular response-ventricular rate is controlled.continue Lopressor.  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  LOS: 15 days   Namiyah Grantham 09/21/2011, 8:36 AM

## 2011-09-21 NOTE — Progress Notes (Signed)
Occupational Therapy Treatment Patient Details Name: LEASHA GOLDBERGER MRN: [id number] DOB: 07/24/1941 Today's Date: 09/21/2011  OT Assessment/Plan OT Assessment/Plan Comments on Treatment Session: Pt with R side inattention.  Marked improvement in mobility and level of awareness from eval.  SNF remains appropriate. OT Plan: Discharge plan remains appropriate OT Frequency: Min 1X/week Follow Up Recommendations: Skilled nursing facility Equipment Recommended: Defer to next venue OT Goals Acute Rehab OT Goals OT Goal Formulation: Patient unable to participate in goal setting Time For Goal Achievement: 2 weeks ADL Goals Pt Will Perform Eating: with set-up;Sitting, chair ADL Goal: Eating - Progress: Not met Pt Will Perform Grooming: with min assist;Standing at sink;Other (comment) (2 activities) ADL Goal: Grooming - Progress: Not met Pt Will Perform Upper Body Bathing: Standing at sink;with min assist;Other (comment) (2 activities) ADL Goal: Upper Body Bathing - Progress: Not met Pt Will Transfer to Toilet: 3-in-1;Ambulation;with min assist ADL Goal: Toilet Transfer - Progress: Not met Additional ADL Goal #1: Pt. will demonstrate sustained attention to simple ADL task x 5 mins. with no cues ADL Goal: Additional Goal #1 - Progress: Met  OT Treatment Precautions/Restrictions  Precautions Precautions: Fall Required Braces or Orthoses: No Restrictions Weight Bearing Restrictions: No   ADL ADL Grooming: Performed;Minimal assistance;Teeth care;Wash/dry face;Wash/dry hands Where Assessed - Grooming: Sitting, chair Upper Body Dressing: Performed;Moderate assistance Upper Body Dressing Details (indicate cue type and reason): front opening gown Where Assessed - Upper Body Dressing: Sitting, bed;Unsupported Toilet Transfer: Simulated;+2 Total assistance (80%, pt with multiple lines, tendency to lean R) Toilet Transfer Details (indicate cue type and reason): to chair after ambulation Toilet  Transfer Method: Ambulating Equipment Used: Rolling walker Mobility  Bed Mobility Bed Mobility: Yes Supine to Sit: 4: Min assist Supine to Sit Details (indicate cue type and reason): Verbal cues/tactile cues for sequencing and advancement of LEs over EOB.  Transfers Sit to Stand: 1: +2 Total assist;From bed (pt = 80%) Sit to Stand Details (indicate cue type and reason): Verbal cues for UE placement and appropriate body mechanics for standing.  Stand to Sit: 4: Min assist;With armrests;To chair/3-in-1 Stand to Sit Details: Verbal and tactile cues for placement of UEs on armrests, verbal cues for control of descent.   End of Session OT - End of Session Equipment Utilized During Treatment: Gait belt Activity Tolerance: Patient tolerated treatment well Patient left: in chair;with call bell in reach General Behavior During Session: [facility] for tasks performed Cognition: Impaired (much improved per chart review, still with decr. processing )  Evern Bio  09/21/2011, 1:36 PM [phone]

## 2011-09-21 NOTE — Progress Notes (Signed)
CSW received referral for SNF placement. Will advise [facility] CSW to complete assessment and initiate search for skilled facility as pt progresses.  Baxter Flattery, MSW [phone]

## 2011-09-21 NOTE — Progress Notes (Signed)
Physical Therapy Treatment Patient Details Name: Carrie [name] MRN: [id number] DOB: [birth year] Today's Date: 09/21/2011  PT Assessment/Plan  PT - Assessment/Plan Comments on Treatment Session: Pt making significant progress per chart review. Pt ambulated ~45' with RW and moderate assistance. Pt has abnormal gait mechanics and Rt. lateral lean but responds well to verbal cues and correcting facilitation.  PT Plan: Discharge plan needs to be updated PT Frequency: Min 3X/week Follow Up Recommendations: 24 hour supervision/assistance;Skilled nursing facility Equipment Recommended: Defer to next venue PT Goals  Acute Rehab PT Goals PT Goal Formulation: With patient Time For Goal Achievement: 2 weeks Pt will go Supine/Side to Sit: with supervision PT Goal: Supine/Side to Sit - Progress: Progressing toward goal Pt will go Sit to Stand: with min assist;with cues (comment type and amount) PT Goal: Sit to Stand - Progress: Progressing toward goal Pt will Transfer Bed to Chair/Chair to Bed: with min assist PT Transfer Goal: Bed to Chair/Chair to Bed - Progress: Progressing toward goal Pt will Ambulate: 16 - 50 feet;with min assist;with least restrictive assistive device PT Goal: Ambulate - Progress: Progressing toward goal  PT Treatment Precautions/Restrictions  Precautions Precautions: Fall Required Braces or Orthoses: No Restrictions Weight Bearing Restrictions: No Mobility (including Balance) Bed Mobility Bed Mobility: Yes Supine to Sit: 4: Min assist Supine to Sit Details (indicate cue type and reason): Verbal cues/tactile cues for sequencing and advancement of LEs over EOB.  Transfers Transfers: Yes Sit to Stand: 1: +2 Total assist;From bed (pt = 80%) Sit to Stand Details (indicate cue type and reason): Verbal cues for UE placement and appropriate body mechanics for standing.  Stand to Sit: 4: Min assist;With armrests;To chair/3-in-1 Stand to Sit Details: Verbal and tactile cues  for placement of UEs on armrests, verbal cues for control of descent.  Ambulation/Gait Ambulation/Gait: Yes Ambulation/Gait Assistance: 3: Mod assist Ambulation/Gait Assistance Details (indicate cue type and reason): Assist secondary to weakness, narrow base of support, and Rt. lateral lean. PT to facilitate/verbally cue for widdened base of support, midline trunk positioning, and appropriate abduction of Rt. LE (as Rt. tends to excessively adduct). Verbal and manual facilitation for upright posture.  Ambulation Distance (Feet): 45 Feet Assistive device: Rolling walker Gait Pattern: Shuffle;Trunk flexed;Scissoring;Decreased stride length    End of Session PT - End of Session Equipment Utilized During Treatment: Gait belt Activity Tolerance: Patient tolerated treatment well Patient left: in chair;with call bell in reach Nurse Communication: Mobility status for ambulation General Behavior During Session: [facility] for tasks performed Cognition: Impaired (much improved per chart review, still with decr. processing )  Sherie Don 09/21/2011, 12:01 PM  Sherie Don) Carleene Mains PT, DPT Acute Rehabilitation [phone]

## 2011-09-22 ENCOUNTER — Inpatient Hospital Stay (HOSPITAL_COMMUNITY): Payer: Medicare Other

## 2011-09-22 HISTORY — PX: CATARACT EXTRACTION W/ INTRAOCULAR LENS  IMPLANT, BILATERAL: SHX1307

## 2011-09-22 LAB — CBC
Hemoglobin: 11.8 g/dL — ABNORMAL LOW (ref 12.0–15.0)
MCH: 29.9 pg (ref 26.0–34.0)
MCV: 86.1 fL (ref 78.0–100.0)
Platelets: 237 10*3/uL (ref 150–400)
WBC: 12.4 10*3/uL — ABNORMAL HIGH (ref 4.0–10.5)

## 2011-09-22 LAB — COMPREHENSIVE METABOLIC PANEL
ALT: 154 U/L — ABNORMAL HIGH (ref 0–35)
Alkaline Phosphatase: 154 U/L — ABNORMAL HIGH (ref 39–117)
CO2: 22 mEq/L (ref 19–32)
Calcium: 9.4 mg/dL (ref 8.4–10.5)
Chloride: 100 mEq/L (ref 96–112)
GFR calc Af Amer: >90 mL/min (ref >90)
GFR calc non Af Amer: 87 mL/min — ABNORMAL LOW (ref >90)
Glucose, Bld: 114 mg/dL — ABNORMAL HIGH (ref 70–99)
Sodium: 132 mEq/L — ABNORMAL LOW (ref 135–145)
Total Bilirubin: 0.3 mg/dL (ref 0.3–1.2)

## 2011-09-22 MED ORDER — WARFARIN SODIUM 7.5 MG PO TABS
7.5000 mg | ORAL_TABLET | Freq: Once | ORAL | Status: AC
  Administered 2011-09-22: 7.5 mg via ORAL
  Filled 2011-09-22: qty 1

## 2011-09-22 MED ORDER — HEPARIN SOD (PORCINE) IN D5W 100 UNIT/ML IV SOLN
1500.0000 [IU]/h | INTRAVENOUS | Status: DC
  Administered 2011-09-22 – 2011-09-24 (×3): 1650 [IU]/h via INTRAVENOUS
  Administered 2011-09-24: 1500 [IU]/h via INTRAVENOUS
  Administered 2011-09-24: 1550 [IU]/h via INTRAVENOUS
  Filled 2011-09-22 (×3): qty 250

## 2011-09-22 NOTE — Progress Notes (Signed)
Social Work, Please call Billey Chang at [phone] about SNF placement. First choice is [facility], 2nd choice is [facility] and 3rd choice is [facility]. Ivor Messier is Ms. Cromie's sister in law. Marjoria Mancillas Ronette Deter RN.

## 2011-09-22 NOTE — Progress Notes (Signed)
Wasted 0.9mg  or 3mL of dilaudid from PCA pump with Carrie Pimple, RN.  Jencarlo Bonadonna, Joan Mayans, RN

## 2011-09-22 NOTE — Progress Notes (Signed)
Pt got up out of bed and pulled on foley. Perineal area is a little swollen. Gwynne Edinger, RN

## 2011-09-22 NOTE — Progress Notes (Addendum)
Subjective: No chest pain. EEG pending. Appears less confused.  Objective: Vital signs in last 24 hours: Temp:  [98.1 F (36.7 C)-99.8 F (37.7 C)] 98.5 F (36.9 C) (01/01 1444) Pulse Rate:  [62-79] 62  (01/01 1444) Resp:  [18-20] 18  (01/01 1444) BP: (120-152)/(72-88) 133/74 mmHg (01/01 1444) SpO2:  [97 %-99 %] 97 % (01/01 1444) Weight change:  Last BM Date: 09/20/11 Intake/Output from previous day:-1230 12/31 0701 - 01/01 0700 In: 1080.1 [P.O.:580; I.V.:500.1] Out: 2850 [Urine:2850] Intake/Output this shift: Total I/O In: 375 [P.O.:120; IV Piggyback:255] Out: 950 [Urine:950]  PE: General: A&O X 3, pleasant affect. Is a bit confused - mild encephalopathy. Heart:S1S2 irreg, irreg.  Soft HSM ~1/6. Sharp valve clicks.  Lungs:Clear without rales, rhonchi. Normal Effort. Abd:+BS, soft, non-tender. Ext:No clubbing/cyanosis or edema.  2+ pulses bilaterally   Lab Results: INR 1.45  Basename 09/22/11 0533 09/21/11 0615  WBC 12.4* 14.3*  HGB 11.8* 10.8*  HCT 34.0* 31.1*  PLT 237 244   BMET  Basename 09/22/11 0533 09/21/11 0615  NA 132* 134*  K 4.0 3.6  CL 100 101  CO2 22 21  GLUCOSE 114* 117*  BUN 12 12  CREATININE 0.68 0.64  CALCIUM 9.4 9.2  Studies/Results: No results found.  Medications: I have reviewed the patient's current medications.  Assessment/Plan: Patient Active Problem List  Diagnoses  . CONSTIPATION  . Encephalopathy acute  . Atrial fibrillation with rapid ventricular response  . S/P mitral valve replacement, [employer] Jude  . Pneumococcal septicemia  . Subarachnoid hemorrhage on CT this adm  . Muscle weakness of lower extremity  . Presence of permanent cardiac pacemaker  . Chronic anticoagulation, ( INR goal 2.5-3.5 under normal circumstances for ST Jude MVR)  . 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   LOS: 16 days    Plan:  1. Mechanical mitral valve prosthesis. -- IV Heparin --> Warfarin (appreciate assistance  from Pharmacists with monitoring & managing these meds)  See Dr. Tessie Fass last note for full explanation of need to re-anticoagulate. 2. Permanent atrial fibrillation satisfactory rate control on current BB dose with PPM.  Also indication for anticoagulation. 3. Prosthetic MV endocarditis -- Continues to be afebrile with WBC continuing to decline.    Repat TEE scheduled for tomorrow with Dr. Rennis Golden ; Will make NPO after MN.  Plan for now is to complete high dose Abx through this week then decrease Rocephin to 2gm daily until end of January (to complete ~6weeks of couverage).    Would then want to to a re-look TTE after completing this course -- to determine need for chronic suppressive Abx vs considering re-do MVR -- will need to involve Dr. Tyrone Sage in this decision. 4. Subarachnoid hemorrhage virtually completely resolved by CT of the head.  Monitor while re-instituting anticoagulation. 5. Encephalopathy with Delerium -- per Neurology, EEG pending.  Seems to be improving. 6. Will f/u with Dr. Clarene Duke after discharge.    Marykay Lex, M.D., M.S. [facility] [address] [place], [place]  [address]  [phone]  09/22/2011 3:29 PM

## 2011-09-22 NOTE — Progress Notes (Signed)
Patient ID: CHRISTAN CICCARELLI, female   DOB: [birth year], 71 y.o.   MRN: [id number] Patient ID: ROXANNE PANEK, female   DOB: [birth year], 71 y.o.   MRN: [id number] Patient ID: KETRA DUCHESNE, female   DOB: 1940-12-25, 71 y.o.   MRN: [id number] Patient ID: YANIA BOGIE, female   DOB: 03-10-1941, 71 y.o.   MRN: [id number] Subjective: Patient  seen. Night uneventful. Denies any specific complaint. Awaiting EEG to be done.  Objective: Weight change:   Intake/Output Summary (Last 24 hours) at 09/22/11 1233 Last data filed at 09/22/11 0954  Gross per 24 hour  Intake 1082.07 ml  Output   1850 ml  Net -767.93 ml   BP 138/72  Pulse 79  Temp(Src) 98.1 F (36.7 C) (Oral)  Resp 20  Ht 5\' 1"  (1.549 m)  Wt 75.7 kg (166 lb 14.2 oz)  BMI 31.53 kg/m2  SpO2 98% Physical Exam: General appearance: alert, cooperative and no distress,pallor Head: Normocephalic, without obvious abnormality, atraumatic Neck: no adenopathy, no carotid bruit, no JVD, supple, symmetrical, trachea midline and thyroid not enlarged, symmetric, no tenderness/mass/nodules Lungs: clear to auscultation bilaterally Heart: irregularly irregular Abdomen: soft, non-tender; bowel sounds normal; no masses,  no organomegaly Extremities: extremities normal, atraumatic, no cyanosis or edema Skin: Skin color, texture, turgor normal. No rashes or lesions Neurology-no lateralizing signs.  Lab Results: Results for orders placed during the hospital encounter of 09/06/11 (from the past 48 hour(s))  CBC     Status: Abnormal   Collection Time   09/21/11  6:15 AM      Component Value Range Comment   WBC 14.3 (*) 4.0 - 10.5 (K/uL)    RBC 3.63 (*) 3.87 - 5.11 (MIL/uL)    Hemoglobin 10.8 (*) 12.0 - 15.0 (g/dL)    HCT 62.9 (*) 52.8 - 46.0 (%)    MCV 85.7  78.0 - 100.0 (fL)    MCH 29.8  26.0 - 34.0 (pg)    MCHC 34.7  30.0 - 36.0 (g/dL)    RDW 41.3  24.4 - 01.0 (%)    Platelets 244  150 - 400 (K/uL)   HEPARIN LEVEL (UNFRACTIONATED)     Status: Normal     Collection Time   09/21/11  6:15 AM      Component Value Range Comment   Heparin Unfractionated 0.49  0.30 - 0.70 (IU/mL)   PROTIME-INR     Status: Abnormal   Collection Time   09/21/11  6:15 AM      Component Value Range Comment   Prothrombin Time 16.9 (*) 11.6 - 15.2 (seconds)    INR 1.35  0.00 - 1.49    COMPREHENSIVE METABOLIC PANEL     Status: Abnormal   Collection Time   09/21/11  6:15 AM      Component Value Range Comment   Sodium 134 (*) 135 - 145 (mEq/L)    Potassium 3.6  3.5 - 5.1 (mEq/L)    Chloride 101  96 - 112 (mEq/L)    CO2 21  19 - 32 (mEq/L)    Glucose, Bld 117 (*) 70 - 99 (mg/dL)    BUN 12  6 - 23 (mg/dL)    Creatinine, Ser 2.72  0.50 - 1.10 (mg/dL)    Calcium 9.2  8.4 - 10.5 (mg/dL)    Total Protein 8.2  6.0 - 8.3 (g/dL)    Albumin 2.2 (*) 3.5 - 5.2 (g/dL)    AST 536 (*) 0 - 37 (U/L)    ALT  173 (*) 0 - 35 (U/L)    Alkaline Phosphatase 142 (*) 39 - 117 (U/L)    Total Bilirubin 0.3  0.3 - 1.2 (mg/dL)    GFR calc non Af Amer 89 (*) >90 (mL/min)    GFR calc Af Amer >90  >90 (mL/min)   CBC     Status: Abnormal   Collection Time   09/22/11  5:33 AM      Component Value Range Comment   WBC 12.4 (*) 4.0 - 10.5 (K/uL)    RBC 3.95  3.87 - 5.11 (MIL/uL)    Hemoglobin 11.8 (*) 12.0 - 15.0 (g/dL)    HCT 16.1 (*) 09.6 - 46.0 (%)    MCV 86.1  78.0 - 100.0 (fL)    MCH 29.9  26.0 - 34.0 (pg)    MCHC 34.7  30.0 - 36.0 (g/dL)    RDW 04.5  40.9 - 81.1 (%)    Platelets 237  150 - 400 (K/uL)   HEPARIN LEVEL (UNFRACTIONATED)     Status: Normal   Collection Time   09/22/11  5:33 AM      Component Value Range Comment   Heparin Unfractionated 0.39  0.30 - 0.70 (IU/mL)   PROTIME-INR     Status: Abnormal   Collection Time   09/22/11  5:33 AM      Component Value Range Comment   Prothrombin Time 17.9 (*) 11.6 - 15.2 (seconds)    INR 1.45  0.00 - 1.49    COMPREHENSIVE METABOLIC PANEL     Status: Abnormal   Collection Time   09/22/11  5:33 AM      Component Value Range Comment    Sodium 132 (*) 135 - 145 (mEq/L)    Potassium 4.0  3.5 - 5.1 (mEq/L)    Chloride 100  96 - 112 (mEq/L)    CO2 22  19 - 32 (mEq/L)    Glucose, Bld 114 (*) 70 - 99 (mg/dL)    BUN 12  6 - 23 (mg/dL)    Creatinine, Ser 9.14  0.50 - 1.10 (mg/dL)    Calcium 9.4  8.4 - 10.5 (mg/dL)    Total Protein 8.6 (*) 6.0 - 8.3 (g/dL)    Albumin 2.4 (*) 3.5 - 5.2 (g/dL)    AST 782 (*) 0 - 37 (U/L)    ALT 154 (*) 0 - 35 (U/L)    Alkaline Phosphatase 154 (*) 39 - 117 (U/L)    Total Bilirubin 0.3  0.3 - 1.2 (mg/dL)    GFR calc non Af Amer 87 (*) >90 (mL/min)    GFR calc Af Amer >90  >90 (mL/min)     Micro Results: No results found for this or any previous visit (from the past 240 hour(s)).  Studies/Results: Ct Angio Head W/cm &/or Wo Cm  09/08/2011  *RADIOLOGY REPORT*  Clinical Data:  2-week history of fever and malaise.  Possible right common carotid artery dissections seen on CT cervical spine.  CT ANGIOGRAPHY HEAD AND NECK  Technique:  Multidetector CT imaging of the head and neck was performed using the standard protocol during bolus administration of intravenous contrast.  Multiplanar CT image reconstructions including MIPs were obtained to evaluate the vascular anatomy. Carotid stenosis measurements (when applicable) are obtained utilizing NASCET criteria, using the distal internal carotid diameter as the denominator.  Contrast: OMNIPAQUE IOHEXOL 350 MG/ML IV SOLN an  Comparison:  CT head and C-spine earlier today.  CTA NECK  Findings:  There is reflux  of contrast into the left internal jugular vein.  This may be related to the left sided pacemaker placement.  There is fair opacification of the craniocerebral vasculature.  Atheromatous change can be seen in the transverse arch.  There is considerable Hounsfield artifact from the pacemaker battery pack. There is no visible proximal great vessel dissection or stenosis.  Both carotid bifurcations are widely patent.  Mild nonstenotic calcific  atheromatous change can be seen on the right.  Cervical internal carotid arteries tortuous but patent without dissection. Both vertebrals are patent and equal in size through the neck. Possible 15 x 16 mm right thyroid nodule.  Consider thyroid ultrasound for further evaluation.  Lung apices clear.   Review of the MIP images confirms the above findings.  IMPRESSION: No visible proximal great vessel dissection within limits of detection by artifact from pacemaker battery pack.  No evidence for flow reducing lesion in the right or left carotid system.  CTA HEAD  Findings:  Calcific nonstenotic atheromatous change of the skull base, cavernous, and supraclinoid internal carotid arteries.  No visible proximal stenosis of the anterior, middle, or posterior cerebral arteries.  Basilar artery patent without focal stenosis. No visible intracranial aneurysm.  Post infusion imaging of the head reveals no abnormal enhancement.  Chronic left basal ganglia infarct redemonstrated.  Left frontal subarachnoid blood appears slightly greater than was seen earlier. It is possible a component of this increased conspicuity relates to enhancement postcontrast. Recommend repeat noncontrast examination for continued surveillance.   Review of the MIP images confirms the above findings.  IMPRESSION: No proximal carotid or basilar stenosis.  No visible intracranial dissection.  Suspect slight increase in left frontal subarachnoid blood. I cannot completely exclude that this increased prominence relates to a component of enhancement Recommend repeat noncontrast imaging in 24-48 hours.  Original Report Authenticated By: Elsie Stain, M.D.   Dg Chest 2 View  09/06/2011  *RADIOLOGY REPORT*  Clinical Data: Rule out infiltrate. Confusion.  CHEST - 2 VIEW  Comparison: 02/20/2008  Findings:  Heart size appears mildly enlarged.  Prior median sternotomy CABG procedure.  Left chest wall pacer device is noted with lead in the right ventricle.  There  are no pleural effusions or interstitial edema.  Chronic bronchitic changes are noted bilaterally.  IMPRESSION:  1.  Cardiac enlargement. 2.  No acute changes.  Original Report Authenticated By: Rosealee Albee, M.D.   Ct Head Wo Contrast  09/16/2011  *RADIOLOGY REPORT*  Clinical Data: Follow up subarachnoid hemorrhage  CT HEAD WITHOUT CONTRAST  Technique:  Contiguous axial images were obtained from the base of the skull through the vertex without contrast.  Comparison: 09/13/2011  Findings: Near complete resolution of prior left frontal subarachnoid hemorrhage.  No parenchymal hemorrhage or new extra- axial hemorrhage is seen.  No mass lesion, mass effect, or midline shift.  Subcortical white matter and periventricular small vessel ischemic changes.  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  Mild global cortical atrophy.  No ventriculomegaly.  The visualized paranasal sinuses are essentially clear. The mastoid air cells are unopacified.  No evidence of calvarial fracture.  IMPRESSION: No evidence of acute intracranial abnormality.  Near complete resolution of prior left frontal subarachnoid hemorrhage.  Original Report Authenticated By: Charline Bills, M.D.   Ct Head Wo Contrast  09/13/2011  *RADIOLOGY REPORT*  Clinical Data: New onset headache and neck soreness.  CT HEAD WITHOUT CONTRAST  Technique:  Contiguous axial images were obtained from the base of the skull through the vertex without  contrast.  Comparison: Head CT scans 09/10/2011 and 09/09/2011.  Findings: Small amount of subarachnoid hemorrhage in the left frontal lobe continues to resolve.  No new hemorrhage is identified.  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  No midline shift, hydrocephalus or abnormal extra-axial fluid collection is identified.  IMPRESSION:  1.  No acute finding. 2.  Resolving subarachnoid hemorrhage on the left.  Original Report Authenticated By: Bernadene Bell. Maricela Curet, M.D.   Ct Head Wo  Contrast  09/10/2011  *RADIOLOGY REPORT*  Clinical Data: Follow up subarachnoid hemorrhage  CT HEAD WITHOUT CONTRAST  Technique:  Contiguous axial images were obtained from the base of the skull through the vertex without contrast.  Comparison: 09/09/2011  Findings: Hyperdense subarachnoid hemorrhage over the convexity left frontal region has improved.  No new areas of hemorrhage are identified.  No subdural hemorrhage is present.  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  No acute infarct.  Negative for mass lesion.  IMPRESSION: Small areas of subarachnoid hemorrhage over the convexity on the left have improved.  No new area of hemorrhage or infarction.  Original Report Authenticated By: Camelia Phenes, M.D.   Ct Head Wo Contrast  09/09/2011  *RADIOLOGY REPORT*  Clinical Data: Followup subarachnoid hemorrhage.  CT HEAD WITHOUT CONTRAST  Technique:  Contiguous axial images were obtained from the base of the skull through the vertex without contrast.  Comparison: 09/07/2011 CT and 11/10/2005 MR.  Findings: Compared to the 09/07/2011 noncontrast CT examination. There has been an increase in amount of subarachnoid blood in the left frontal region.  Etiology of this is indeterminate.  Result of venous thrombosis / infarct not excluded.  Nonspecific white matter type changes most notable left frontal region.  Remote left caudate head infarct with encephalomalacia. This is new compared to 2007 MR. No CT evidence of large acute infarct.  Small acute infarct cannot be excluded by CT.  No intracranial mass lesion detected on this unenhanced exam.  IMPRESSION: Compared to the 09/07/2011 noncontrast CT examination.  There has been an increase in amount of subarachnoid blood in the left frontal region.  Etiology of this is indeterminate. Please see above.  Original Report Authenticated By: Fuller Canada, M.D.   Ct Head Wo Contrast  09/07/2011  *RADIOLOGY REPORT*  Clinical Data: Headaches  CT HEAD  WITHOUT CONTRAST  Technique:  Contiguous axial images were obtained from the base of the skull through the vertex without contrast.  Comparison: [facility] brain dated 11/10/2005  Findings: Tiny hyperdense foci overlying the left frontal lobe (series 2/images 14 and 22) and left medial frontal lobe (series 2/image 19), possibly reflecting subarachnoid hemorrhage.  No mass lesion, mass effect, or midline shift.  No CT evidence of acute infarction.  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  Subcortical white matter and periventricular small vessel ischemic changes.  Mildly age related atrophy.  No ventriculomegaly.  The visualized paranasal sinuses are essentially clear. The mastoid air cells are unopacified.  No evidence of calvarial fracture.  IMPRESSION: Possible tiny foci of subarachnoid hemorrhage overlying the left frontal lobe, as described above.  Old lacunar infarct with small vessel ischemic changes  Critical Value/emergent results were called by telephone at the time of interpretation on 09/07/2011  at 1810 hours  to  Dr Delford Field, who verbally acknowledged these results.  Original Report Authenticated By: Charline Bills, M.D.   Ct Angio Neck W/cm &/or Wo/cm  09/08/2011  *RADIOLOGY REPORT*  Clinical Data:  2-week history of  fever and malaise.  Possible right common carotid artery dissections seen on CT cervical spine.  CT ANGIOGRAPHY HEAD AND NECK  Technique:  Multidetector CT imaging of the head and neck was performed using the standard protocol during bolus administration of intravenous contrast.  Multiplanar CT image reconstructions including MIPs were obtained to evaluate the vascular anatomy. Carotid stenosis measurements (when applicable) are obtained utilizing NASCET criteria, using the distal internal carotid diameter as the denominator.  Contrast: OMNIPAQUE IOHEXOL 350 MG/ML IV SOLN an  Comparison:  CT head and C-spine earlier today.  CTA NECK  Findings:  There is reflux of contrast  into the left internal jugular vein.  This may be related to the left sided pacemaker placement.  There is fair opacification of the craniocerebral vasculature.  Atheromatous change can be seen in the transverse arch.  There is considerable Hounsfield artifact from the pacemaker battery pack. There is no visible proximal great vessel dissection or stenosis.  Both carotid bifurcations are widely patent.  Mild nonstenotic calcific atheromatous change can be seen on the right.  Cervical internal carotid arteries tortuous but patent without dissection. Both vertebrals are patent and equal in size through the neck. Possible 15 x 16 mm right thyroid nodule.  Consider thyroid ultrasound for further evaluation.  Lung apices clear.   Review of the MIP images confirms the above findings.  IMPRESSION: No visible proximal great vessel dissection within limits of detection by artifact from pacemaker battery pack.  No evidence for flow reducing lesion in the right or left carotid system.  CTA HEAD  Findings:  Calcific nonstenotic atheromatous change of the skull base, cavernous, and supraclinoid internal carotid arteries.  No visible proximal stenosis of the anterior, middle, or posterior cerebral arteries.  Basilar artery patent without focal stenosis. No visible intracranial aneurysm.  Post infusion imaging of the head reveals no abnormal enhancement.  Chronic left basal ganglia infarct redemonstrated.  Left frontal subarachnoid blood appears slightly greater than was seen earlier. It is possible a component of this increased conspicuity relates to enhancement postcontrast. Recommend repeat noncontrast examination for continued surveillance.   Review of the MIP images confirms the above findings.  IMPRESSION: No proximal carotid or basilar stenosis.  No visible intracranial dissection.  Suspect slight increase in left frontal subarachnoid blood. I cannot completely exclude that this increased prominence relates to a component  of enhancement Recommend repeat noncontrast imaging in 24-48 hours.  Original Report Authenticated By: Elsie Stain, M.D.   Ct Cervical Spine W Contrast  09/07/2011  *RADIOLOGY REPORT*  Clinical Data: C-spine tenderness, evaluate for fracture or abscess  CT CERVICAL SPINE WITH CONTRAST  Technique:  Multidetector CT imaging of the cervical spine was performed during intravenous contrast administration. Multiplanar CT image reconstructions were also generated.  Contrast: OMNIPAQUE IOHEXOL 300 MG/ML IV SOLN  Comparison: None.  Findings: Normal cervical lordosis.  No evidence of fracture or dislocation.  Vertebral body heights are maintained.  The dens appears intact.  No prevertebral soft tissue swelling.  Mild multilevel degenerative changes.  No epidural abscess/fluid collection is seen.  There is a possible linear filling defect within the right common carotid artery (series 606/image 69, series 605/image 4), and although this may reflect artifact at the thoracic inlet, a focal dissection cannot be excluded.  IMPRESSION: Apparent linear filling defect within the right common carotid artery, possibly artifactual, although focal dissection cannot be excluded.  Consider CTA/MRA neck for further characterization as clinically warranted.  No evidence of fracture  or dislocation.  No epidural abscess/fluid collection is seen.  Critical Value/emergent results were called by telephone at the time of interpretation on 09/07/2011  at 1810 hours  to  Dr Delford Field, who verbally acknowledged these results.  Original Report Authenticated By: Charline Bills, M.D.   Ct Thoracic Spine Wo Contrast  09/10/2011  *RADIOLOGY REPORT*  Clinical Data:  Leg weakness  CT THORACIC AND LUMBAR SPINE WITHOUT CONTRAST  Technique:  Multidetector CT imaging of the thoracic and lumbar spine was performed without contrast. Multiplanar CT image reconstructions were also generated.  Comparison:  None  CT THORACIC SPINE  Findings:  No  fracture or dislocation is seen.  Mild multilevel degenerative changes of the thoracic spine, most prominent at T11-12.  Lower cervical spine is incompletely imaged.  Left paracentral disc herniation at C5-6 (series 9/image 14).  Visualized thyroid is unremarkable.  Cardiomegaly.  Coronary atherosclerosis.  Valve annuloplasty.  Atherosclerotic calcifications of the aortic arch.  Pacemaker leads.  Evaluation of the lung parenchyma is constrained by respiratory motion.  Scattered patchy left upper lobe opacities. Mild right middle lobe opacity / scarring.  Small bilateral pleural effusions with associated lower lobe atelectasis.  IMPRESSION: No fracture or dislocation is seen.  Mild multilevel degenerative changes, most prominent at T11-12.  Left paracentral disc herniation at C5-6.  Evaluation of the lung parenchyma is constrained by respiratory motion.  Patchy left upper lobe and right middle lobe opacities. Small bilateral pleural effusions with associated lower lobe atelectasis.  CT LUMBAR SPINE  Findings: No fracture or dislocation is seen.  Very mild multilevel degenerative changes.  Atherosclerotic calcifications of the abdominal aorta and branch vessels.  Suspected calcified uterine fibroids.  IMPRESSION: No fracture or dislocation is seen.  Very mild multilevel degenerative changes.  Original Report Authenticated By: Charline Bills, M.D.   Ct Lumbar Spine Wo Contrast  09/10/2011  *RADIOLOGY REPORT*  Clinical Data:  Leg weakness  CT THORACIC AND LUMBAR SPINE WITHOUT CONTRAST  Technique:  Multidetector CT imaging of the thoracic and lumbar spine was performed without contrast. Multiplanar CT image reconstructions were also generated.  Comparison:  None  CT THORACIC SPINE  Findings:  No fracture or dislocation is seen.  Mild multilevel degenerative changes of the thoracic spine, most prominent at T11-12.  Lower cervical spine is incompletely imaged.  Left paracentral disc herniation at C5-6 (series 9/image  14).  Visualized thyroid is unremarkable.  Cardiomegaly.  Coronary atherosclerosis.  Valve annuloplasty.  Atherosclerotic calcifications of the aortic arch.  Pacemaker leads.  Evaluation of the lung parenchyma is constrained by respiratory motion.  Scattered patchy left upper lobe opacities. Mild right middle lobe opacity / scarring.  Small bilateral pleural effusions with associated lower lobe atelectasis.  IMPRESSION: No fracture or dislocation is seen.  Mild multilevel degenerative changes, most prominent at T11-12.  Left paracentral disc herniation at C5-6.  Evaluation of the lung parenchyma is constrained by respiratory motion.  Patchy left upper lobe and right middle lobe opacities. Small bilateral pleural effusions with associated lower lobe atelectasis.  CT LUMBAR SPINE  Findings: No fracture or dislocation is seen.  Very mild multilevel degenerative changes.  Atherosclerotic calcifications of the abdominal aorta and branch vessels.  Suspected calcified uterine fibroids.  IMPRESSION: No fracture or dislocation is seen.  Very mild multilevel degenerative changes.  Original Report Authenticated By: Charline Bills, M.D.   Dg Chest Port 1 View  09/17/2011  *RADIOLOGY REPORT*  Clinical Data: Respiratory failure, shortness of breath  PORTABLE CHEST - 1 VIEW  Comparison: 09/11/2011; 09/09/2011; 09/08/2011; chest CT - 09/10/2011  Findings:  Grossly unchanged enlarged cardiac silhouette and mediastinal contours post median sternotomy and valve replacement given decreased lung volumes.  Grossly unchanged positioning of single lead pacemaker with tip overlying the expected location of the right ventricle.  Decreased lung volumes with corresponding interval increase in right perihilar heterogeneous opacities. Cephalization of flow with thickening along the right minor fissure.  No definite pleural effusion or pneumothorax.  Unchanged bones.  IMPRESSION: 1.  Decreased lung volumes with interval increase in right  perihilar opacities to a represent atelectasis. 2.  Query mild pulmonary edema.  Original Report Authenticated By: Waynard Reeds, M.D.   Dg Chest Port 1 View  09/11/2011  *RADIOLOGY REPORT*  Clinical Data: Respiratory failure  PORTABLE CHEST - 1 VIEW  Comparison: 09/09/2011  Findings: Heart is moderately enlarged.  Single lead left subclavian pacemaker device unchanged.  Low volumes and hazy airspace opacities improved.  Stable mid lung right-sided linear opacity.  Normal pulmonary vascularity.  IMPRESSION: Improved aeration and airspace opacities.  Original Report Authenticated By: Donavan Burnet, M.D.   Dg Chest Port 1 View  09/09/2011  *RADIOLOGY REPORT*  Clinical Data: No edema, evaluate endotracheal tube position  PORTABLE CHEST - 1 VIEW  Comparison: Portable chest x-ray of 09/08/2011  Findings: No endotracheal tube is visible.  There is little change in poor aeration and basilar volume loss.  Cardiomegaly is stable. 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  IMPRESSION: No endotracheal tube.  No change in poor aeration with basilar volume loss.  Original Report Authenticated By: Juline Patch, M.D.   Dg Chest Port 1 View  09/08/2011  *RADIOLOGY REPORT*  Clinical Data: Respiratory distress.  PORTABLE CHEST - 1 VIEW  Comparison: 09/06/2011  Findings: Cardiomegaly.  Mild vascular congestion.  Interstitial prominence could reflect interstitial edema.  This is increased slightly since prior study.  No effusions.  No acute bony abnormality.  IMPRESSION: Increasing interstitial prominence, question interstitial edema.  Original Report Authenticated By: Cyndie Chime, M.D.   Medications: Scheduled Meds:    . cefTRIAXone (ROCEPHIN)  IV  2 g Intravenous Q12H  . digoxin  0.125 mg Oral Daily  . famotidine  20 mg Oral BID  . lacosamide (VIMPAT) IV  100 mg Intravenous Q12H  . metoprolol tartrate  25 mg Oral BID  . warfarin  7.5 mg Oral ONCE-1800  . DISCONTD: HYDROmorphone PCA 0.3 mg/mL    Intravenous Q4H   Continuous Infusions:    . sodium chloride 20 mL/hr at 09/22/11 0659  . heparin 1,600 Units/hr (09/22/11 0030)   PRN Meds:.sodium chloride, acetaminophen, diphenhydrAMINE, diphenhydrAMINE, naloxone, ondansetron (ZOFRAN) IV, polyethylene glycol, sodium chloride  Assessment/Plan: #1 Acute Encephalopathy-most likely secondary to [facility].mentation is improving markedly.  #2 Atrial fibrillation with rapid ventricular response-ventricular rate is controlled.continue Lopressor.  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 Discuss case with the stroke team Dr. Pearlean Brownie and he wants patient to be on IV heparin until INR is about 2.5.    LOS: 16 days   Lacreshia Bondarenko 09/22/2011, 12:33 PM

## 2011-09-22 NOTE — Progress Notes (Addendum)
Pt's PCA pump was discontinued around 0551. There was 0.9mg  of waste left in the syringe. The waste was verified & witnessed with another [employer]. Delrae Sawyers, RN

## 2011-09-22 NOTE — Progress Notes (Signed)
Patient ID: Carrie [name], female   DOB: Jun 12, 1941, 71 y.o.   MRN: [id number]  Subjective: Patient asking about going home, .Confusion seems to be improving but persists. No family at bedside..  Objective: Vital signs in last 24 hours: Temp:  [98.1 F (36.7 C)-99.8 F (37.7 C)] 98.1 F (36.7 C) (01/01 0608) Pulse Rate:  [65-79] 79  (01/01 0608) Resp:  [18-20] 20  (01/01 0608) BP: (120-152)/(71-88) 138/72 mmHg (01/01 0610) SpO2:  [96 %-99 %] 98 % (01/01 0608) Weight change:  Last BM Date: 09/20/11  Filed Vitals:   09/22/11 0336 09/22/11 0337 09/22/11 0608 09/22/11 0610  BP:   152/78 138/72  Pulse:   79   Temp:   98.1 F (36.7 C)   TempSrc:   Oral   Resp: 20 20 20    Height:      Weight:      SpO2: 98% 98% 98%    Intake/Output from previous day: 12/31 0701 - 01/01 0700 In: 1080.1 [P.O.:580; I.V.:500.1] Out: 2850 [Urine:2850] Intake/Output this shift: Total I/O In: 120 [P.O.:120] Out: -  Physical Examination: Mental Status Exam: oriented to 2012, name, dob, Pres. Obama but still have difficulty carrying on a conversation, tends to repeat herself and ideas, Cranial Nerve Exam: PUPILS equal reactive. EOMI. Facial symmetry is present. Extraocular movements are full. Visual fields shows some decrease blink to threat on the right  Motor Exam: The patient moves all 4 extremities  Deep tendon reflexes: Deep tendon reflexes are symmetric throughout. Toes are downgoing bilaterally.     Lab Results:  [facility] 09/22/11 0533 09/21/11 0615  WBC 12.4* 14.3*  HGB 11.8* 10.8*  HCT 34.0* 31.1*  PLT 237 244   BMET  Basename 09/22/11 0533 09/21/11 0615  NA 132* 134*  K 4.0 3.6  CL 100 101  CO2 22 21  GLUCOSE 114* 117*  BUN 12 12  CREATININE 0.68 0.64  CALCIUM 9.4 9.2   INR 1.35  Studies/Results: No results found.  Medications:  Scheduled:     . cefTRIAXone (ROCEPHIN)  IV  2 g Intravenous Q12H  . digoxin  0.125 mg Oral Daily  . famotidine  20 mg Oral BID  .  lacosamide (VIMPAT) IV  100 mg Intravenous Q12H  . metoprolol tartrate  25 mg Oral BID  . warfarin  7.5 mg Oral ONCE-1800  . DISCONTD: HYDROmorphone PCA 0.3 mg/mL   Intravenous Q4H   Continuous:     . sodium chloride 20 mL/hr at 09/22/11 0659  . heparin 1,600 Units/hr (09/22/11 0030)   WUJ:WJXBJY chloride, acetaminophen, diphenhydrAMINE, diphenhydrAMINE, naloxone, ondansetron (ZOFRAN) IV, polyethylene glycol, sodium chloride  Assessment/Plan: 1. Pneumococcal bacteremia and prosthetic valve endocarditis -cont current antibiotics as directed by ID -Cardiology following - repeat TEE planned for Wed 09/22/2010  -TCTS following - likely redo MVR eventually  2. Small subarachnoid hemorrhage (suspect post-traumatic vs. septic emboli), now resolved, back to heparin drip now,  Also on coumadin. Tolerating well.  3. Delirium, it was described during initial presentation, DDx, complex partial seizure vs encephalopathy, EEG pending yet. Empirically treating with vimpat now, continues to have  improvement.  3. Hypertension  4. Atrial fibrillation  Follow up EEG. D/W hospitalist   LOS: 16 days   SHARON BIBY, AVNP, ANP-BC, GNP-BC [facility] Pager: [phone] 09/22/2011 11:39 AM  Dr. Delia Heady, Stroke Center Medical Director, has personally reviewed chart, pertinent data, examined the patient and developed the plan of care. Marland Kitchen

## 2011-09-22 NOTE — Progress Notes (Signed)
ANTICOAGULATION CONSULT NOTE - Follow Up Consult  Pharmacy Consult for heparin and coumadin Indication: St Jude MVR  Assessment:  71 yo female on coumadin PTA for MVR, restarted 12/29, now bridging with therapeutic heparin.  INR today remains subtherapeutic at 1.45, Heparin Level remains therapeutic at 0.39. No bleeding issues reported.  Will increase heparin rate slightly to maintain in range.  Home coumadin dose: 5mg  daily  Pharmacy System-Based Medication Review: Admit Complaint: Flu like symptoms, small SAH   Infectious Diseases: high dose ceftriaxone x2 weeks (started 12/18) followed by 2gm q24h x4 weeks for strep pneumo bacteremia/PVE/empirically for meningitis, no renal adjustment needed. Neurology: s/p SAH, persistent leg weakness and headache, no cord compression noted. Neurology ok with starting anticoag now. Cardiovascular: St Jude MVR, TEE reading c/w prosthetic valve endocarditis, plan to repeat TEE, on home digoxin & lopressor. Fluids/Electrolytes/Nutrition: lytes wnl Nephrology: CrCl~39mL/min, stable Best Practices: IV heparin, coumadin, home meds addressed  Goal of Therapy:  Heparin level 0.3-0.7 units/ml INR 2.5-3.5   Plan:  1. Increase heparin infusion to 1650 units/hr.  Check heparin level in AM.  2. Coumadin 7.5 mg PO x1 today. Check INR in AM.  Ival Bible 09/22/2011,2:16 PM   Allergies  Allergen Reactions  . Codeine Nausea And Vomiting    Patient Measurements: Height: 5\' 1"  (154.9 cm) Weight: 166 lb 14.2 oz (75.7 kg) IBW/kg (Calculated) : 47.8   Vital Signs: Temp: 98.1 F (36.7 C) (01/01 0608) Temp src: Oral (01/01 0608) BP: 138/72 mmHg (01/01 0610) Pulse Rate: 79  (01/01 0608)  Labs:  Basename 09/22/11 0533 09/21/11 0615 09/20/11 9604 09/20/11 0625 09/19/11 1614  HGB 11.8* 10.8* -- -- --  HCT 34.0* 31.1* -- 32.2* --  PLT 237 244 -- 255 --  APTT -- -- -- -- --  LABPROT 17.9* 16.9* -- -- 17.0*  INR 1.45 1.35 -- -- 1.36  HEPARINUNFRC 0.39  0.49 -- 0.69 --  CREATININE 0.68 0.64 0.64 -- --  CKTOTAL -- -- -- -- --  CKMB -- -- -- -- --  TROPONINI -- -- -- -- --   Estimated Creatinine Clearance: 61.8 ml/min (by C-G formula based on Cr of 0.68).   Medications:  Scheduled:     . cefTRIAXone (ROCEPHIN)  IV  2 g Intravenous Q12H  . digoxin  0.125 mg Oral Daily  . famotidine  20 mg Oral BID  . lacosamide (VIMPAT) IV  100 mg Intravenous Q12H  . metoprolol tartrate  25 mg Oral BID  . warfarin  7.5 mg Oral ONCE-1800  . DISCONTD: HYDROmorphone PCA 0.3 mg/mL   Intravenous Q4H

## 2011-09-23 ENCOUNTER — Encounter (HOSPITAL_COMMUNITY): Payer: Self-pay

## 2011-09-23 ENCOUNTER — Encounter (HOSPITAL_COMMUNITY)
Admission: EM | Disposition: A | Discharge status: Skilled Nursing Facility | Payer: Self-pay | Source: Home / Self Care | Attending: Internal Medicine

## 2011-09-23 HISTORY — PX: TEE WITHOUT CARDIOVERSION: SHX5443

## 2011-09-23 LAB — COMPREHENSIVE METABOLIC PANEL
Albumin: 2.3 g/dL — ABNORMAL LOW (ref 3.5–5.2)
BUN: 11 mg/dL (ref 6–23)
Calcium: 9.4 mg/dL (ref 8.4–10.5)
Creatinine, Ser: 0.58 mg/dL (ref 0.50–1.10)
GFR calc Af Amer: >90 mL/min (ref >90)
Glucose, Bld: 108 mg/dL — ABNORMAL HIGH (ref 70–99)
Total Protein: 8.1 g/dL (ref 6.0–8.3)

## 2011-09-23 LAB — CBC
Hemoglobin: 10.3 g/dL — ABNORMAL LOW (ref 12.0–15.0)
MCHC: 33.2 g/dL (ref 30.0–36.0)
Platelets: 243 10*3/uL (ref 150–400)
RDW: 15 % (ref 11.5–15.5)

## 2011-09-23 LAB — HEPARIN LEVEL (UNFRACTIONATED): Heparin Unfractionated: 0.41 IU/mL (ref 0.30–0.70)

## 2011-09-23 LAB — PROTIME-INR: Prothrombin Time: 21.4 seconds — ABNORMAL HIGH (ref 11.6–15.2)

## 2011-09-23 SURGERY — ECHOCARDIOGRAM, TRANSESOPHAGEAL
Anesthesia: Moderate Sedation

## 2011-09-23 MED ORDER — FENTANYL CITRATE 0.05 MG/ML IJ SOLN
INTRAMUSCULAR | Status: DC | PRN
  Administered 2011-09-23: 25 ug via INTRAVENOUS
  Administered 2011-09-23: 12.5 ug via INTRAVENOUS

## 2011-09-23 MED ORDER — FENTANYL CITRATE 0.05 MG/ML IJ SOLN
INTRAMUSCULAR | Status: AC
  Filled 2011-09-23: qty 2

## 2011-09-23 MED ORDER — LIDOCAINE VISCOUS 2 % MT SOLN
OROMUCOSAL | Status: AC
  Filled 2011-09-23: qty 30

## 2011-09-23 MED ORDER — MIDAZOLAM HCL 10 MG/2ML IJ SOLN
INTRAMUSCULAR | Status: DC | PRN
  Administered 2011-09-23 (×3): 1 mg via INTRAVENOUS

## 2011-09-23 MED ORDER — WARFARIN SODIUM 7.5 MG PO TABS
7.5000 mg | ORAL_TABLET | Freq: Once | ORAL | Status: AC
  Administered 2011-09-23: 7.5 mg via ORAL
  Filled 2011-09-23: qty 1

## 2011-09-23 MED ORDER — BUTAMBEN-TETRACAINE-BENZOCAINE 2-2-14 % EX AERO
INHALATION_SPRAY | CUTANEOUS | Status: DC | PRN
  Administered 2011-09-23: 2 via TOPICAL

## 2011-09-23 MED ORDER — MIDAZOLAM HCL 10 MG/2ML IJ SOLN
INTRAMUSCULAR | Status: AC
  Filled 2011-09-23: qty 2

## 2011-09-23 MED ORDER — LACOSAMIDE 50 MG PO TABS
100.0000 mg | ORAL_TABLET | Freq: Two times a day (BID) | ORAL | Status: DC
  Administered 2011-09-23 – 2011-09-30 (×15): 100 mg via ORAL
  Filled 2011-09-23 (×3): qty 2
  Filled 2011-09-23: qty 1
  Filled 2011-09-23 (×8): qty 2
  Filled 2011-09-23: qty 1
  Filled 2011-09-23 (×5): qty 2

## 2011-09-23 NOTE — Progress Notes (Signed)
Patient ID: Carrie [name], female   DOB: 1940/11/27, 71 y.o.   MRN: [id number] Patient ID: Carrie [name], female   DOB: 1941-04-05, 71 y.o.   MRN: [id number] Patient ID: Carrie [name], female   DOB: 1940/09/25, 71 y.o.   MRN: [id number] Patient ID: Carrie [name], female   DOB: 10-04-1940, 71 y.o.   MRN: [id number] Patient ID: Carrie [name], female   DOB: [birth year], 71 y.o.   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 Patient also has a history of atrial fibrillation in the current ventricular rate is controlled. She had a positive blood culture and she is on IV Rocephin and also currently being anticoagulated with IV heparin and Coumadin. Patient is currently scheduled for EEG as well as TEE. She is currently on IV heparin and Coumadin and neurology on case. I had an extensive discussion with the neuro team Dr. Pearlean Brownie and the goal is to have INR at 2.5 before discontinuing IV heparin. Ultimately, patient will need SNF placement after discharge. She's been seen by me today, night uneventful, denies any specific complaints, and scheduled to have TEE today.  Objective: Weight change:   Intake/Output Summary (Last 24 hours) at 09/23/11 0904 Last data filed at 09/23/11 0852  Gross per 24 hour  Intake    909 ml  Output   1750 ml  Net   -841 ml   BP 133/73  Pulse 69  Temp(Src) 98.1 F (36.7 C) (Oral)  Resp 20  Ht 5\' 1"  (1.549 m)  Wt 75.7 kg (166 lb 14.2 oz)  BMI 31.53 kg/m2  SpO2 95% Physical Exam: General appearance: alert, cooperative and no distress,pallor Head: Normocephalic, without obvious abnormality, atraumatic Neck: no adenopathy, no carotid bruit, no JVD, supple, symmetrical, trachea midline and thyroid not  enlarged, symmetric, no tenderness/mass/nodules Lungs: clear to auscultation bilaterally Heart: irregularly irregular Abdomen: soft, non-tender; bowel sounds normal; no masses,  no organomegaly Extremities: extremities normal, atraumatic, no cyanosis or edema Skin: Skin color, texture, turgor normal. No rashes or lesions Neurology-no lateralizing signs.  Lab Results: Results for orders placed during the hospital encounter of 09/06/11 (from the past 48 hour(s))  CBC     Status: Abnormal   Collection Time   09/22/11  5:33 AM      Component Value Range Comment   WBC 12.4 (*) 4.0 - 10.5 (K/uL)    RBC 3.95  3.87 - 5.11 (MIL/uL)    Hemoglobin 11.8 (*) 12.0 - 15.0 (g/dL)    HCT 24.4 (*) 01.0 - 46.0 (%)    MCV 86.1  78.0 - 100.0 (fL)    MCH 29.9  26.0 - 34.0 (pg)    MCHC 34.7  30.0 - 36.0 (g/dL)    RDW 27.2  53.6 - 64.4 (%)    Platelets 237  150 - 400 (K/uL)   HEPARIN LEVEL (UNFRACTIONATED)     Status: Normal   Collection Time   09/22/11  5:33 AM      Component Value Range Comment   Heparin Unfractionated 0.39  0.30 - 0.70 (IU/mL)   PROTIME-INR     Status: Abnormal   Collection Time   09/22/11  5:33 AM      Component Value Range Comment   Prothrombin Time 17.9 (*) 11.6 - 15.2 (seconds)  INR 1.45  0.00 - 1.49    COMPREHENSIVE METABOLIC PANEL     Status: Abnormal   Collection Time   09/22/11  5:33 AM      Component Value Range Comment   Sodium 132 (*) 135 - 145 (mEq/L)    Potassium 4.0  3.5 - 5.1 (mEq/L)    Chloride 100  96 - 112 (mEq/L)    CO2 22  19 - 32 (mEq/L)    Glucose, Bld 114 (*) 70 - 99 (mg/dL)    BUN 12  6 - 23 (mg/dL)    Creatinine, Ser 1.61  0.50 - 1.10 (mg/dL)    Calcium 9.4  8.4 - 10.5 (mg/dL)    Total Protein 8.6 (*) 6.0 - 8.3 (g/dL)    Albumin 2.4 (*) 3.5 - 5.2 (g/dL)    AST 096 (*) 0 - 37 (U/L)    ALT 154 (*) 0 - 35 (U/L)    Alkaline Phosphatase 154 (*) 39 - 117 (U/L)    Total Bilirubin 0.3  0.3 - 1.2 (mg/dL)    GFR calc non Af Amer 87 (*) >90 (mL/min)    GFR  calc Af Amer >90  >90 (mL/min)   CBC     Status: Abnormal   Collection Time   09/23/11  6:15 AM      Component Value Range Comment   WBC 10.9 (*) 4.0 - 10.5 (K/uL)    RBC 3.61 (*) 3.87 - 5.11 (MIL/uL)    Hemoglobin 10.3 (*) 12.0 - 15.0 (g/dL)    HCT 04.5 (*) 40.9 - 46.0 (%)    MCV 85.9  78.0 - 100.0 (fL)    MCH 28.5  26.0 - 34.0 (pg)    MCHC 33.2  30.0 - 36.0 (g/dL)    RDW 81.1  91.4 - 78.2 (%)    Platelets 243  150 - 400 (K/uL)   HEPARIN LEVEL (UNFRACTIONATED)     Status: Normal   Collection Time   09/23/11  6:15 AM      Component Value Range Comment   Heparin Unfractionated 0.41  0.30 - 0.70 (IU/mL)   PROTIME-INR     Status: Abnormal   Collection Time   09/23/11  6:15 AM      Component Value Range Comment   Prothrombin Time 21.4 (*) 11.6 - 15.2 (seconds)    INR 1.82 (*) 0.00 - 1.49      Micro Results: No results found for this or any previous visit (from the past 240 hour(s)).  Studies/Results: Ct Angio Head W/cm &/or Wo Cm  09/08/2011  *RADIOLOGY REPORT*  Clinical Data:  2-week history of fever and malaise.  Possible right common carotid artery dissections seen on CT cervical spine.  CT ANGIOGRAPHY HEAD AND NECK  Technique:  Multidetector CT imaging of the head and neck was performed using the standard protocol during bolus administration of intravenous contrast.  Multiplanar CT image reconstructions including MIPs were obtained to evaluate the vascular anatomy. Carotid stenosis measurements (when applicable) are obtained utilizing NASCET criteria, using the distal internal carotid diameter as the denominator.  Contrast: OMNIPAQUE IOHEXOL 350 MG/ML IV SOLN an  Comparison:  CT head and C-spine earlier today.  CTA NECK  Findings:  There is reflux of contrast into the left internal jugular vein.  This may be related to the left sided pacemaker placement.  There is fair opacification of the craniocerebral vasculature.  Atheromatous change can be seen in the transverse arch.  There is  considerable Hounsfield  artifact from the pacemaker battery pack. There is no visible proximal great vessel dissection or stenosis.  Both carotid bifurcations are widely patent.  Mild nonstenotic calcific atheromatous change can be seen on the right.  Cervical internal carotid arteries tortuous but patent without dissection. Both vertebrals are patent and equal in size through the neck. Possible 15 x 16 mm right thyroid nodule.  Consider thyroid ultrasound for further evaluation.  Lung apices clear.   Review of the MIP images confirms the above findings.  IMPRESSION: No visible proximal great vessel dissection within limits of detection by artifact from pacemaker battery pack.  No evidence for flow reducing lesion in the right or left carotid system.  CTA HEAD  Findings:  Calcific nonstenotic atheromatous change of the skull base, cavernous, and supraclinoid internal carotid arteries.  No visible proximal stenosis of the anterior, middle, or posterior cerebral arteries.  Basilar artery patent without focal stenosis. No visible intracranial aneurysm.  Post infusion imaging of the head reveals no abnormal enhancement.  Chronic left basal ganglia infarct redemonstrated.  Left frontal subarachnoid blood appears slightly greater than was seen earlier. It is possible a component of this increased conspicuity relates to enhancement postcontrast. Recommend repeat noncontrast examination for continued surveillance.   Review of the MIP images confirms the above findings.  IMPRESSION: No proximal carotid or basilar stenosis.  No visible intracranial dissection.  Suspect slight increase in left frontal subarachnoid blood. I cannot completely exclude that this increased prominence relates to a component of enhancement Recommend repeat noncontrast imaging in 24-48 hours.  Original Report Authenticated By: Elsie Stain, M.D.   Dg Chest 2 View  09/06/2011  *RADIOLOGY REPORT*  Clinical Data: Rule out infiltrate. Confusion.   CHEST - 2 VIEW  Comparison: 02/20/2008  Findings:  Heart size appears mildly enlarged.  Prior median sternotomy CABG procedure.  Left chest wall pacer device is noted with lead in the right ventricle.  There are no pleural effusions or interstitial edema.  Chronic bronchitic changes are noted bilaterally.  IMPRESSION:  1.  Cardiac enlargement. 2.  No acute changes.  Original Report Authenticated By: Rosealee Albee, M.D.   Ct Head Wo Contrast  09/16/2011  *RADIOLOGY REPORT*  Clinical Data: Follow up subarachnoid hemorrhage  CT HEAD WITHOUT CONTRAST  Technique:  Contiguous axial images were obtained from the base of the skull through the vertex without contrast.  Comparison: 09/13/2011  Findings: Near complete resolution of prior left frontal subarachnoid hemorrhage.  No parenchymal hemorrhage or new extra- axial hemorrhage is seen.  No mass lesion, mass effect, or midline shift.  Subcortical white matter and periventricular small vessel ischemic changes.  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  Mild global cortical atrophy.  No ventriculomegaly.  The visualized paranasal sinuses are essentially clear. The mastoid air cells are unopacified.  No evidence of calvarial fracture.  IMPRESSION: No evidence of acute intracranial abnormality.  Near complete resolution of prior left frontal subarachnoid hemorrhage.  Original Report Authenticated By: Charline Bills, M.D.   Ct Head Wo Contrast  09/13/2011  *RADIOLOGY REPORT*  Clinical Data: New onset headache and neck soreness.  CT HEAD WITHOUT CONTRAST  Technique:  Contiguous axial images were obtained from the base of the skull through the vertex without contrast.  Comparison: Head CT scans 09/10/2011 and 09/09/2011.  Findings: Small amount of subarachnoid hemorrhage in the left frontal lobe continues to resolve.  No new hemorrhage is identified.  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  No midline shift,  hydrocephalus or abnormal extra-axial  fluid collection is identified.  IMPRESSION:  1.  No acute finding. 2.  Resolving subarachnoid hemorrhage on the left.  Original Report Authenticated By: Bernadene Bell. Maricela Curet, M.D.   Ct Head Wo Contrast  09/10/2011  *RADIOLOGY REPORT*  Clinical Data: Follow up subarachnoid hemorrhage  CT HEAD WITHOUT CONTRAST  Technique:  Contiguous axial images were obtained from the base of the skull through the vertex without contrast.  Comparison: 09/09/2011  Findings: Hyperdense subarachnoid hemorrhage over the convexity left frontal region has improved.  No new areas of hemorrhage are identified.  No subdural hemorrhage is present.  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  No acute infarct.  Negative for mass lesion.  IMPRESSION: Small areas of subarachnoid hemorrhage over the convexity on the left have improved.  No new area of hemorrhage or infarction.  Original Report Authenticated By: Camelia Phenes, M.D.   Ct Head Wo Contrast  09/09/2011  *RADIOLOGY REPORT*  Clinical Data: Followup subarachnoid hemorrhage.  CT HEAD WITHOUT CONTRAST  Technique:  Contiguous axial images were obtained from the base of the skull through the vertex without contrast.  Comparison: 09/07/2011 CT and 11/10/2005 MR.  Findings: Compared to the 09/07/2011 noncontrast CT examination. There has been an increase in amount of subarachnoid blood in the left frontal region.  Etiology of this is indeterminate.  Result of venous thrombosis / infarct not excluded.  Nonspecific white matter type changes most notable left frontal region.  Remote left caudate head infarct with encephalomalacia. This is new compared to 2007 MR. No CT evidence of large acute infarct.  Small acute infarct cannot be excluded by CT.  No intracranial mass lesion detected on this unenhanced exam.  IMPRESSION: Compared to the 09/07/2011 noncontrast CT examination.  There has been an increase in amount of subarachnoid blood in the left frontal region.  Etiology  of this is indeterminate. Please see above.  Original Report Authenticated By: Fuller Canada, M.D.   Ct Head Wo Contrast  09/07/2011  *RADIOLOGY REPORT*  Clinical Data: Headaches  CT HEAD WITHOUT CONTRAST  Technique:  Contiguous axial images were obtained from the base of the skull through the vertex without contrast.  Comparison: [facility] brain dated 11/10/2005  Findings: Tiny hyperdense foci overlying the left frontal lobe (series 2/images 14 and 22) and left medial frontal lobe (series 2/image 19), possibly reflecting subarachnoid hemorrhage.  No mass lesion, mass effect, or midline shift.  No CT evidence of acute infarction.  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  Subcortical white matter and periventricular small vessel ischemic changes.  Mildly age related atrophy.  No ventriculomegaly.  The visualized paranasal sinuses are essentially clear. The mastoid air cells are unopacified.  No evidence of calvarial fracture.  IMPRESSION: Possible tiny foci of subarachnoid hemorrhage overlying the left frontal lobe, as described above.  Old lacunar infarct with small vessel ischemic changes  Critical Value/emergent results were called by telephone at the time of interpretation on 09/07/2011  at 1810 hours  to  Dr Delford Field, who verbally acknowledged these results.  Original Report Authenticated By: Charline Bills, M.D.   Ct Angio Neck W/cm &/or Wo/cm  09/08/2011  *RADIOLOGY REPORT*  Clinical Data:  2-week history of fever and malaise.  Possible right common carotid artery dissections seen on CT cervical spine.  CT ANGIOGRAPHY HEAD AND NECK  Technique:  Multidetector CT imaging of the head and neck was performed using the standard protocol during bolus administration of intravenous contrast.  Multiplanar CT image reconstructions including MIPs were obtained to evaluate the vascular anatomy. Carotid stenosis measurements (when applicable) are obtained utilizing NASCET criteria, using the distal internal  carotid diameter as the denominator.  Contrast: OMNIPAQUE IOHEXOL 350 MG/ML IV SOLN an  Comparison:  CT head and C-spine earlier today.  CTA NECK  Findings:  There is reflux of contrast into the left internal jugular vein.  This may be related to the left sided pacemaker placement.  There is fair opacification of the craniocerebral vasculature.  Atheromatous change can be seen in the transverse arch.  There is considerable Hounsfield artifact from the pacemaker battery pack. There is no visible proximal great vessel dissection or stenosis.  Both carotid bifurcations are widely patent.  Mild nonstenotic calcific atheromatous change can be seen on the right.  Cervical internal carotid arteries tortuous but patent without dissection. Both vertebrals are patent and equal in size through the neck. Possible 15 x 16 mm right thyroid nodule.  Consider thyroid ultrasound for further evaluation.  Lung apices clear.   Review of the MIP images confirms the above findings.  IMPRESSION: No visible proximal great vessel dissection within limits of detection by artifact from pacemaker battery pack.  No evidence for flow reducing lesion in the right or left carotid system.  CTA HEAD  Findings:  Calcific nonstenotic atheromatous change of the skull base, cavernous, and supraclinoid internal carotid arteries.  No visible proximal stenosis of the anterior, middle, or posterior cerebral arteries.  Basilar artery patent without focal stenosis. No visible intracranial aneurysm.  Post infusion imaging of the head reveals no abnormal enhancement.  Chronic left basal ganglia infarct redemonstrated.  Left frontal subarachnoid blood appears slightly greater than was seen earlier. It is possible a component of this increased conspicuity relates to enhancement postcontrast. Recommend repeat noncontrast examination for continued surveillance.   Review of the MIP images confirms the above findings.  IMPRESSION: No proximal carotid or basilar  stenosis.  No visible intracranial dissection.  Suspect slight increase in left frontal subarachnoid blood. I cannot completely exclude that this increased prominence relates to a component of enhancement Recommend repeat noncontrast imaging in 24-48 hours.  Original Report Authenticated By: Elsie Stain, M.D.   Ct Cervical Spine W Contrast  09/07/2011  *RADIOLOGY REPORT*  Clinical Data: C-spine tenderness, evaluate for fracture or abscess  CT CERVICAL SPINE WITH CONTRAST  Technique:  Multidetector CT imaging of the cervical spine was performed during intravenous contrast administration. Multiplanar CT image reconstructions were also generated.  Contrast: OMNIPAQUE IOHEXOL 300 MG/ML IV SOLN  Comparison: None.  Findings: Normal cervical lordosis.  No evidence of fracture or dislocation.  Vertebral body heights are maintained.  The dens appears intact.  No prevertebral soft tissue swelling.  Mild multilevel degenerative changes.  No epidural abscess/fluid collection is seen.  There is a possible linear filling defect within the right common carotid artery (series 606/image 69, series 605/image 4), and although this may reflect artifact at the thoracic inlet, a focal dissection cannot be excluded.  IMPRESSION: Apparent linear filling defect within the right common carotid artery, possibly artifactual, although focal dissection cannot be excluded.  Consider CTA/MRA neck for further characterization as clinically warranted.  No evidence of fracture or dislocation.  No epidural abscess/fluid collection is seen.  Critical Value/emergent results were called by telephone at the time of interpretation on 09/07/2011  at 1810 hours  to  Dr Delford Field, who verbally acknowledged these results.  Original Report Authenticated By: Charline Bills,  M.D.   Ct Thoracic Spine Wo Contrast  09/10/2011  *RADIOLOGY REPORT*  Clinical Data:  Leg weakness  CT THORACIC AND LUMBAR SPINE WITHOUT CONTRAST  Technique:  Multidetector CT  imaging of the thoracic and lumbar spine was performed without contrast. Multiplanar CT image reconstructions were also generated.  Comparison:  None  CT THORACIC SPINE  Findings:  No fracture or dislocation is seen.  Mild multilevel degenerative changes of the thoracic spine, most prominent at T11-12.  Lower cervical spine is incompletely imaged.  Left paracentral disc herniation at C5-6 (series 9/image 14).  Visualized thyroid is unremarkable.  Cardiomegaly.  Coronary atherosclerosis.  Valve annuloplasty.  Atherosclerotic calcifications of the aortic arch.  Pacemaker leads.  Evaluation of the lung parenchyma is constrained by respiratory motion.  Scattered patchy left upper lobe opacities. Mild right middle lobe opacity / scarring.  Small bilateral pleural effusions with associated lower lobe atelectasis.  IMPRESSION: No fracture or dislocation is seen.  Mild multilevel degenerative changes, most prominent at T11-12.  Left paracentral disc herniation at C5-6.  Evaluation of the lung parenchyma is constrained by respiratory motion.  Patchy left upper lobe and right middle lobe opacities. Small bilateral pleural effusions with associated lower lobe atelectasis.  CT LUMBAR SPINE  Findings: No fracture or dislocation is seen.  Very mild multilevel degenerative changes.  Atherosclerotic calcifications of the abdominal aorta and branch vessels.  Suspected calcified uterine fibroids.  IMPRESSION: No fracture or dislocation is seen.  Very mild multilevel degenerative changes.  Original Report Authenticated By: Charline Bills, M.D.   Ct Lumbar Spine Wo Contrast  09/10/2011  *RADIOLOGY REPORT*  Clinical Data:  Leg weakness  CT THORACIC AND LUMBAR SPINE WITHOUT CONTRAST  Technique:  Multidetector CT imaging of the thoracic and lumbar spine was performed without contrast. Multiplanar CT image reconstructions were also generated.  Comparison:  None  CT THORACIC SPINE  Findings:  No fracture or dislocation is seen.  Mild  multilevel degenerative changes of the thoracic spine, most prominent at T11-12.  Lower cervical spine is incompletely imaged.  Left paracentral disc herniation at C5-6 (series 9/image 14).  Visualized thyroid is unremarkable.  Cardiomegaly.  Coronary atherosclerosis.  Valve annuloplasty.  Atherosclerotic calcifications of the aortic arch.  Pacemaker leads.  Evaluation of the lung parenchyma is constrained by respiratory motion.  Scattered patchy left upper lobe opacities. Mild right middle lobe opacity / scarring.  Small bilateral pleural effusions with associated lower lobe atelectasis.  IMPRESSION: No fracture or dislocation is seen.  Mild multilevel degenerative changes, most prominent at T11-12.  Left paracentral disc herniation at C5-6.  Evaluation of the lung parenchyma is constrained by respiratory motion.  Patchy left upper lobe and right middle lobe opacities. Small bilateral pleural effusions with associated lower lobe atelectasis.  CT LUMBAR SPINE  Findings: No fracture or dislocation is seen.  Very mild multilevel degenerative changes.  Atherosclerotic calcifications of the abdominal aorta and branch vessels.  Suspected calcified uterine fibroids.  IMPRESSION: No fracture or dislocation is seen.  Very mild multilevel degenerative changes.  Original Report Authenticated By: Charline Bills, M.D.   Dg Chest Port 1 View  09/17/2011  *RADIOLOGY REPORT*  Clinical Data: Respiratory failure, shortness of breath  PORTABLE CHEST - 1 VIEW  Comparison: 09/11/2011; 09/09/2011; 09/08/2011; chest CT - 09/10/2011  Findings:  Grossly unchanged enlarged cardiac silhouette and mediastinal contours post median sternotomy and valve replacement given decreased lung volumes.  Grossly unchanged positioning of single lead pacemaker with tip overlying the expected location of  the right ventricle.  Decreased lung volumes with corresponding interval increase in right perihilar heterogeneous opacities. Cephalization of flow  with thickening along the right minor fissure.  No definite pleural effusion or pneumothorax.  Unchanged bones.  IMPRESSION: 1.  Decreased lung volumes with interval increase in right perihilar opacities to a represent atelectasis. 2.  Query mild pulmonary edema.  Original Report Authenticated By: Waynard Reeds, M.D.   Dg Chest Port 1 View  09/11/2011  *RADIOLOGY REPORT*  Clinical Data: Respiratory failure  PORTABLE CHEST - 1 VIEW  Comparison: 09/09/2011  Findings: Heart is moderately enlarged.  Single lead left subclavian pacemaker device unchanged.  Low volumes and hazy airspace opacities improved.  Stable mid lung right-sided linear opacity.  Normal pulmonary vascularity.  IMPRESSION: Improved aeration and airspace opacities.  Original Report Authenticated By: Donavan Burnet, M.D.   Dg Chest Port 1 View  09/09/2011  *RADIOLOGY REPORT*  Clinical Data: No edema, evaluate endotracheal tube position  PORTABLE CHEST - 1 VIEW  Comparison: Portable chest x-ray of 09/08/2011  Findings: No endotracheal tube is visible.  There is little change in poor aeration and basilar volume loss.  Cardiomegaly is stable. 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  IMPRESSION: No endotracheal tube.  No change in poor aeration with basilar volume loss.  Original Report Authenticated By: Juline Patch, M.D.   Dg Chest Port 1 View  09/08/2011  *RADIOLOGY REPORT*  Clinical Data: Respiratory distress.  PORTABLE CHEST - 1 VIEW  Comparison: 09/06/2011  Findings: Cardiomegaly.  Mild vascular congestion.  Interstitial prominence could reflect interstitial edema.  This is increased slightly since prior study.  No effusions.  No acute bony abnormality.  IMPRESSION: Increasing interstitial prominence, question interstitial edema.  Original Report Authenticated By: Cyndie Chime, M.D.   Medications: Scheduled Meds:    . cefTRIAXone (ROCEPHIN)  IV  2 g Intravenous Q12H  . digoxin  0.125 mg Oral Daily  . famotidine  20  mg Oral BID  . lacosamide (VIMPAT) IV  100 mg Intravenous Q12H  . metoprolol tartrate  25 mg Oral BID  . warfarin  7.5 mg Oral ONCE-1800   Continuous Infusions:    . sodium chloride 20 mL/hr at 09/22/11 0659  . heparin 1,650 Units/hr (09/22/11 1821)  . DISCONTD: heparin 1,600 Units/hr (09/22/11 0030)   PRN Meds:.sodium chloride, acetaminophen, diphenhydrAMINE, diphenhydrAMINE, naloxone, ondansetron (ZOFRAN) IV, polyethylene glycol, sodium chloride  Assessment/Plan: #1 Acute Encephalopathy-most likely secondary to [facility].mentation is improving markedly.  #2 Atrial fibrillation with rapid ventricular response-ventricular rate is controlled.continue Lopressor.  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 Goal is to have o INR at 2.5 before discontinuing IV heparin. Patient will require SNF placement on discharge.    LOS: 17 days   Tangi Shroff 09/23/2011, 9:04 AM

## 2011-09-23 NOTE — Op Note (Signed)
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 The risks, benefits and alternatives for the procedure were discussed and the patient comprehended these risks.  Risks include, but are not limited to, cough, sore throat, vomiting, nausea, somnolence, esophageal and stomach trauma or perforation, bleeding, low blood pressure, aspiration, pneumonia, infection, trauma to the teeth and death.    After a procedural time-out, the patient was given 3 mg versed and 37.5 mcg fentanyl for moderate sedation.  The oropharynx was anesthetized 10 cc of topical 1% viscous lidocaine and 2 sprays of topical cetacaine.  The transesophageal probe was inserted in the esophagus and stomach without difficulty and multiple views were obtained.  The patient was kept under observation until the patient left the procedure room.  The patient left the procedure room in stable condition.   Agitated microbubble saline contrast was not administered.  COMPLICATIONS:    There were no immediate complications.  FINDINGS:  1.   LEFT VENTRICLE: The left ventricle is normal in structure and function. There is moderate concentric LVH. The LVEF is >65%.  2.   RIGHT VENTRICLE:  The right ventricle is mildly dilated with normal function without any thrombus or masses.  There is pacemaker lead seen in the right ventricle  3.   LEFT ATRIUM:  The left atrium is dilated with spontaneous echo contrast, suggestive of a low-flow state.   4.   LEFT ATRIAL APPENDAGE:  Color and pulse doppler of the LAA suggest low flow, but no LAA thrombus was identified.  5. ATRIAL SEPTUM:  The atrial septum is free of any thrombus or masses.  There is no evidence for interatrial shunting by color doppler.  6. RIGHT ATRIUM:  The right atrium is dilated without any thrombus or masses.  A pacemaker lead is seen.  7. MITRAL VALVE:  There is a St. Jude mitral valve with what appears to be a mobile, gelatinous mass of low echodensity attached to the tilting apparatus in the typical anterior mitral leaflet position, extending into the left atrium. This was visualized by 2D and 3D echo and appears to be unchanged in size compared to the previous reported study and likely represents thrombus/vegetation.   8. AORTIC VALVE:  The aortic valve is mildly sclerotic without obvious vegetation. No insufficiency.  9. TRICUSPID VALVE:  The tricuspid valve is normal in structure and function with moderate regurgitation.  10. PULMONIC VALVE:  The pulmonic valve is normal in structure with trace regurgitation.  11. AORTIC ARCH, ASCENDING AND DESCENDING AORTA:  There was no atherosclerosis of the proximal descending aorta or aortic arch, the proximal aortic arch was not well visualized.  IMPRESSION:   1.  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  RECOMMENDATIONS:    1.  Continued anticoagulation.  Full course of antibiotics, repeat blood cultures in the future. Continued CT surgical evaluation for possible valve revision.  Time Spent Directly with the Patient:  45 minutes   Chrystie Nose, MD Attending Cardiologist [facility]  09/23/2011, 1:32 PM

## 2011-09-23 NOTE — Progress Notes (Signed)
Clinical Social Worker completed psychosocial assessment and placed in shadow chart. Pt spouse agreeable to SNF search in [facility]. Pt spouse choices are [employer], [employer] and [address], and [employer]. Clinical Child psychotherapist to completed FL-2 and initiated SNF search in [place]. Clinical Social Worker to follow up with pt family in regard to bed offers. Clinical Social Worker to facilitate pt discharge needs when pt medically ready for discharge.  Jacklynn Lewis, MSW, LCSWA  Clinical Social Work [phone]

## 2011-09-23 NOTE — Progress Notes (Signed)
[facility]  DAILY PROGRESS NOTE   Subjective:  Seems clear today. Understands she is for a procedure today. Afebrile.  Objective:  Temp:  [98.1 F (36.7 C)-98.5 F (36.9 C)] 98.1 F (36.7 C) (01/01 2245) Pulse Rate:  [62-69] 69  (01/01 2245) Resp:  [18-20] 20  (01/01 2245) BP: (133)/(73-74) 133/73 mmHg (01/01 2245) SpO2:  [95 %-97 %] 95 % (01/01 2245) Weight change:   Intake/Output from previous day: 01/01 0701 - 01/02 0700 In: 909 [P.O.:240; I.V.:329; IV Piggyback:340] Out: 1750 [Urine:1750]  Intake/Output from this shift:    Medications: Current Facility-Administered Medications  Medication Dose Route Frequency Provider Last Rate Last Dose  . 0.9 %  sodium chloride infusion   Intravenous Continuous Billy Fischer, MD 20 mL/hr at 09/22/11 [phone]    . 0.9 %  sodium chloride infusion  250 mL Intravenous PRN Kalman Shan, MD 20 mL/hr at 09/23/11 0133 250 mL at 09/23/11 0133  . acetaminophen (TYLENOL) tablet 650 mg  650 mg Oral Q6H PRN Cliffton Asters, MD   650 mg at 09/16/11 1754  . cefTRIAXone (ROCEPHIN) 2 g in dextrose 5 % 50 mL IVPB  2 g Intravenous Q12H Cliffton Asters, MD   2 g at 09/23/11 0214  . digoxin (LANOXIN) tablet 0.125 mg  0.125 mg Oral Daily Eda Paschal Mona, PA   0.125 mg at 09/22/11 1005  . diphenhydrAMINE (BENADRYL) injection 12.5 mg  12.5 mg Intravenous Q6H PRN Billy Fischer, MD       Or  . diphenhydrAMINE (BENADRYL) 12.5 MG/5ML elixir 12.5 mg  12.5 mg Oral Q6H PRN Billy Fischer, MD      . famotidine (PEPCID) tablet 20 mg  20 mg Oral BID Kalman Shan, MD   20 mg at 09/22/11 2238  . heparin ADULT infusion 100 units/ml (25000 units/250 ml)  1,650 Units/hr Intravenous Continuous Ival Bible, PHARMD 16.5 mL/hr at 09/22/11 1821 1,650 Units/hr at 09/22/11 1821  . lacosamide (VIMPAT) 100 mg in sodium chloride 0.9 % 25 mL IVPB  100 mg Intravenous Q12H Levert Feinstein, MD   100 mg at 09/22/11 2238  . metoprolol tartrate (LOPRESSOR) tablet 25  mg  25 mg Oral BID Billy Fischer, MD   25 mg at 09/22/11 2238  . naloxone [facility]) injection 0.4 mg  0.4 mg Intravenous PRN Billy Fischer, MD       And  . sodium chloride 0.9 % injection 9 mL  9 mL Intravenous PRN Billy Fischer, MD      . ondansetron [facility]) injection 4 mg  4 mg Intravenous Q6H PRN Billy Fischer, MD   4 mg at 09/20/11 1512  . polyethylene glycol (MIRALAX / GLYCOLAX) packet 17 g  17 g Oral Daily PRN Billy Fischer, MD   17 g at 09/14/11 1754  . warfarin (COUMADIN) tablet 7.5 mg  7.5 mg Oral ONCE-1800 Ival Bible, PHARMD   7.5 mg at 09/22/11 1821  . DISCONTD: heparin ADULT infusion 100 units/ml (25000 units/250 ml)  1,600 Units/hr Intravenous Continuous Eugene Garnet, PHARMD 16 mL/hr at 09/22/11 0030 1,600 Units/hr at 09/22/11 0030    Physical Exam: General appearance: alert and no distress Neck: no adenopathy, no carotid bruit, no JVD, supple, symmetrical, trachea midline and thyroid not enlarged, symmetric, no tenderness/mass/nodules Lungs: clear to auscultation bilaterally Heart: RRR, s1/S2, loud mechanical heart sound, 1-2/6 systolic murmur Abdomen: soft, non-tender; bowel sounds normal; no masses,  no organomegaly Extremities: extremities normal, atraumatic, no cyanosis or edema  Lab Results:  Results for orders placed during the hospital encounter of 09/06/11 (from the past 48 hour(s))  CBC     Status: Abnormal   Collection Time   09/22/11  5:33 AM      Component Value Range Comment   WBC 12.4 (*) 4.0 - 10.5 (K/uL)    RBC 3.95  3.87 - 5.11 (MIL/uL)    Hemoglobin 11.8 (*) 12.0 - 15.0 (g/dL)    HCT 40.9 (*) 81.1 - 46.0 (%)    MCV 86.1  78.0 - 100.0 (fL)    MCH 29.9  26.0 - 34.0 (pg)    MCHC 34.7  30.0 - 36.0 (g/dL)    RDW 91.4  78.2 - 95.6 (%)    Platelets 237  150 - 400 (K/uL)   HEPARIN LEVEL (UNFRACTIONATED)     Status: Normal   Collection Time   09/22/11  5:33 AM      Component Value Range Comment   Heparin Unfractionated 0.39  0.30 - 0.70 (IU/mL)     PROTIME-INR     Status: Abnormal   Collection Time   09/22/11  5:33 AM      Component Value Range Comment   Prothrombin Time 17.9 (*) 11.6 - 15.2 (seconds)    INR 1.45  0.00 - 1.49    COMPREHENSIVE METABOLIC PANEL     Status: Abnormal   Collection Time   09/22/11  5:33 AM      Component Value Range Comment   Sodium 132 (*) 135 - 145 (mEq/L)    Potassium 4.0  3.5 - 5.1 (mEq/L)    Chloride 100  96 - 112 (mEq/L)    CO2 22  19 - 32 (mEq/L)    Glucose, Bld 114 (*) 70 - 99 (mg/dL)    BUN 12  6 - 23 (mg/dL)    Creatinine, Ser 2.13  0.50 - 1.10 (mg/dL)    Calcium 9.4  8.4 - 10.5 (mg/dL)    Total Protein 8.6 (*) 6.0 - 8.3 (g/dL)    Albumin 2.4 (*) 3.5 - 5.2 (g/dL)    AST 086 (*) 0 - 37 (U/L)    ALT 154 (*) 0 - 35 (U/L)    Alkaline Phosphatase 154 (*) 39 - 117 (U/L)    Total Bilirubin 0.3  0.3 - 1.2 (mg/dL)    GFR calc non Af Amer 87 (*) >90 (mL/min)    GFR calc Af Amer >90  >90 (mL/min)   CBC     Status: Abnormal   Collection Time   09/23/11  6:15 AM      Component Value Range Comment   WBC 10.9 (*) 4.0 - 10.5 (K/uL)    RBC 3.61 (*) 3.87 - 5.11 (MIL/uL)    Hemoglobin 10.3 (*) 12.0 - 15.0 (g/dL)    HCT 57.8 (*) 46.9 - 46.0 (%)    MCV 85.9  78.0 - 100.0 (fL)    MCH 28.5  26.0 - 34.0 (pg)    MCHC 33.2  30.0 - 36.0 (g/dL)    RDW 62.9  52.8 - 41.3 (%)    Platelets 243  150 - 400 (K/uL)   HEPARIN LEVEL (UNFRACTIONATED)     Status: Normal   Collection Time   09/23/11  6:15 AM      Component Value Range Comment   Heparin Unfractionated 0.41  0.30 - 0.70 (IU/mL)   PROTIME-INR     Status: Abnormal   Collection Time   09/23/11  6:15 AM      Component  Value Range Comment   Prothrombin Time 21.4 (*) 11.6 - 15.2 (seconds)    INR 1.82 (*) 0.00 - 1.49      Imaging: No results found.  Assessment:  1. Principal Problem: 2.  *Encephalopathy acute 3. Active Problems: 4.  Atrial fibrillation with rapid ventricular response 5.  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  Pneumococcal  septicemia 7.  Subarachnoid hemorrhage on CT this adm 8.  Presence of permanent cardiac pacemaker 9.  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   Plan:  1. Repeat TEE today to look for resolution of vegetation/thrombus of the mitral valve.  Time Spent Directly with Patient:  15 minutes  Length of Stay:  LOS: 17 days   Chrystie Nose, MD Attending Cardiologist [facility]  Veona Bittman C 09/23/2011, 8:04 AM

## 2011-09-23 NOTE — Progress Notes (Signed)
ANTICOAGULATION CONSULT NOTE - Follow Up Consult  Pharmacy Consult for Heparin and Coumadin Indication: St Jude MVR  Assessment:  71 yo female on Coumadin PTA for MVR, restarted 12/29, now bridging with therapeutic heparin.  INR today is subtherapeutic and heparin level is therapeutic. No bleeding issues reported.    Home Coumadin dose: 5mg  daily  Goal of Therapy:  Heparin level 0.3-0.7 units/ml INR 2.5-3.5   Plan:  1. Continue heparin infusion at 1650 units/hr.  Check heparin level in AM.  2. Coumadin 7.5 mg PO x1 today. Check INR in AM.  Loura Back, PharmD, BCPS 09/23/2011 3:57 PM    Allergies  Allergen Reactions  . Codeine Nausea And Vomiting    Patient Measurements: Height: 5\' 1"  (154.9 cm) Weight: 166 lb 14.2 oz (75.7 kg) IBW/kg (Calculated) : 47.8   Vital Signs: Temp: 97.6 F (36.4 C) (01/02 1429) Temp src: Oral (01/02 1429) BP: 145/67 mmHg (01/02 1429) Pulse Rate: 68  (01/02 1429)  Labs:  Basename 09/23/11 0615 09/22/11 0533 09/21/11 0615  HGB 10.3* 11.8* --  HCT 31.0* 34.0* 31.1*  PLT 243 237 244  APTT -- -- --  LABPROT 21.4* 17.9* 16.9*  INR 1.82* 1.45 1.35  HEPARINUNFRC 0.41 0.39 0.49  CREATININE 0.58 0.68 0.64  CKTOTAL -- -- --  CKMB -- -- --  TROPONINI -- -- --   Estimated Creatinine Clearance: 61.8 ml/min (by C-G formula based on Cr of 0.58).   Medications:  Scheduled:     . cefTRIAXone (ROCEPHIN)  IV  2 g Intravenous Q12H  . digoxin  0.125 mg Oral Daily  . famotidine  20 mg Oral BID  . lacosamide  100 mg Oral BID  . metoprolol tartrate  25 mg Oral BID  . warfarin  7.5 mg Oral ONCE-1800  . DISCONTD: lacosamide (VIMPAT) IV  100 mg Intravenous Q12H

## 2011-09-23 NOTE — Procedures (Signed)
EEG ID:  [id number].  HISTORY:  A 71 year old woman with altered mental status per report.  MEDICATIONS:  No anticonvulsant medications.  CONDITION OF RECORDING:  This 16-lead EEG was recorded with the patient in awake and drowsy states.  Background rhythms: background patterns in wakefulness were well organized with a well sustained posterior dominant rhythm of 6.5-7.5 Hz, symmetrical and reactive to eye opening and closing.  Drowsiness was associated with mild attenuation of voltage and slowing of frequencies.  Abnormal potentials: no epileptiform activity or focal slowing was noted.  ACTIVATION PROCEDURES:  Hyperventilation was not performed.  Photic stimulation was not performed.  EKG:  Single-channel EKG monitoring detected an irregular rhythm.  IMPRESSION:  This is an abnormal awake and drowsy EEG due to the presence of mild diffuse background slowing.  The finding may be suggestive of mild diffuse cerebral dysfunction and may be due to toxic metabolic encephalopathy, neurodegenerative disorder, and/or Bi-hemispheric structural abnormality.  Single-channel of EKG monitoring detected an irregular rhythm.  If clinically warranted, clinical correlation is suggested.          ______________________________ Carmell Austria, MD    EX:BMWU D:  09/22/2011 17:24:00  T:  09/22/2011 22:51:28  Job #:  [id number]

## 2011-09-23 NOTE — Progress Notes (Signed)
Patient ID: Carrie [name], female   DOB: 12/11/1940, 71 y.o.   MRN: [id number]  Subjective: Patient sitting up in bed, .Confusion seems to be improving even more. No family at bedside.  Objective: Vital signs in last 24 hours: Temp:  [98.1 F (36.7 C)-98.5 F (36.9 C)] 98.1 F (36.7 C) (01/01 2245) Pulse Rate:  [62-69] 69  (01/01 2245) Resp:  [18-20] 20  (01/01 2245) BP: (133)/(73-74) 133/73 mmHg (01/01 2245) SpO2:  [95 %-97 %] 95 % (01/01 2245) Weight change:  Last BM Date: 09/20/11  Filed Vitals:   09/22/11 0610 09/22/11 1444 09/22/11 2238 09/22/11 2245  BP: 138/72 133/74 133/73 133/73  Pulse:  62 69 69  Temp:  98.5 F (36.9 C)  98.1 F (36.7 C)  TempSrc:    Oral  Resp:  18  20  Height:      Weight:      SpO2:  97%  95%   Intake/Output from previous day: 01/01 0701 - 01/02 0700 In: 909 [P.O.:240; I.V.:329; IV Piggyback:340] Out: 1750 [Urine:1750] Intake/Output this shift:   Physical Examination: Mental Status Exam: oriented to 2013, name, dob, but still have difficulty carrying on a conversation, tends to repeat herself and ideas, Cranial Nerve Exam: PUPILS equal reactive. EOMI. Facial symmetry is present. Extraocular movements are full. Visual fields shows some decrease blink to threat on the right  Motor Exam: The patient moves all 4 extremities  Deep tendon reflexes: Deep tendon reflexes are symmetric throughout. Toes are downgoing bilaterally.    Lab Results: Results for orders placed during the hospital encounter of 09/06/11 (from the past 24 hour(s))  CBC     Status: Abnormal   Collection Time   09/23/11  6:15 AM      Component Value Range   WBC 10.9 (*) 4.0 - 10.5 (K/uL)   RBC 3.61 (*) 3.87 - 5.11 (MIL/uL)   Hemoglobin 10.3 (*) 12.0 - 15.0 (g/dL)   HCT 11.9 (*) 14.7 - 46.0 (%)   MCV 85.9  78.0 - 100.0 (fL)   MCH 28.5  26.0 - 34.0 (pg)   MCHC 33.2  30.0 - 36.0 (g/dL)   RDW 82.9  56.2 - 13.0 (%)   Platelets 243  150 - 400 (K/uL)  HEPARIN LEVEL  (UNFRACTIONATED)     Status: Normal   Collection Time   09/23/11  6:15 AM      Component Value Range   Heparin Unfractionated 0.41  0.30 - 0.70 (IU/mL)  PROTIME-INR     Status: Abnormal   Collection Time   09/23/11  6:15 AM      Component Value Range   Prothrombin Time 21.4 (*) 11.6 - 15.2 (seconds)   INR 1.82 (*) 0.00 - 1.49    Studies/Results: No results found.  Medications:  Scheduled:    . cefTRIAXone (ROCEPHIN)  IV  2 g Intravenous Q12H  . digoxin  0.125 mg Oral Daily  . famotidine  20 mg Oral BID  . lacosamide (VIMPAT) IV  100 mg Intravenous Q12H  . metoprolol tartrate  25 mg Oral BID  . warfarin  7.5 mg Oral ONCE-1800   Continuous:    . sodium chloride 20 mL/hr at 09/22/11 0659  . heparin 1,650 Units/hr (09/22/11 1821)  . DISCONTD: heparin 1,600 Units/hr (09/22/11 0030)   QMV:HQIONG chloride, acetaminophen, diphenhydrAMINE, diphenhydrAMINE, naloxone, ondansetron (ZOFRAN) IV, polyethylene glycol, sodium chloride  EEG This is an abnormal awake and drowsy EEG due to the presence of mild diffuse background slowing. The  finding may be suggestive of mild diffuse cerebral dysfunction, may be due to toxic metabolic encephalopathy, neurodegenerative disorder, and/or bihemispheric structural abnormality. Single-channel EKG monitoring detected an irregular rhythm. If clinically warranted, clinical correlation is suggested.  Assessment/Plan: 1. Pneumococcal bacteremia and prosthetic valve endocarditis -cont current antibiotics as directed by ID -Cardiology following - repeat TEE planned for Wed 09/22/2010  -TCTS following - likely redo MVR eventually  2. Small subarachnoid hemorrhage (suspect post-traumatic vs. septic emboli), now resolved, back to heparin drip now,  Also on coumadin. Tolerating well.  3. Delirium, it was described during initial presentation, DDx, complex partial seizure vs encephalopathy, EEG without seizures. On IV  vimpat now, continue for now. Will change to  po.  3. Hypertension  4. Atrial fibrillation, on IV heparin/coumadin   LOS: 17 days   SHARON BIBY, AVNP, ANP-BC, GNP-BC [facility] Pager: [phone] 09/23/2011 8:05 AM  Dr. Delia Heady, Stroke Center Medical Director, has personally reviewed chart, pertinent data, examined the patient and developed the plan of care.

## 2011-09-24 ENCOUNTER — Encounter (HOSPITAL_COMMUNITY): Payer: Self-pay | Admitting: Internal Medicine

## 2011-09-24 LAB — COMPREHENSIVE METABOLIC PANEL
ALT: 128 U/L — ABNORMAL HIGH (ref 0–35)
AST: 209 U/L — ABNORMAL HIGH (ref 0–37)
Albumin: 2.4 g/dL — ABNORMAL LOW (ref 3.5–5.2)
CO2: 24 mEq/L (ref 19–32)
Calcium: 9.7 mg/dL (ref 8.4–10.5)
Chloride: 103 mEq/L (ref 96–112)
Creatinine, Ser: 0.66 mg/dL (ref 0.50–1.10)
GFR calc non Af Amer: 88 mL/min — ABNORMAL LOW (ref >90)
Sodium: 138 mEq/L (ref 135–145)

## 2011-09-24 LAB — CBC
HCT: 32.7 % — ABNORMAL LOW (ref 36.0–46.0)
MCV: 86.7 fL (ref 78.0–100.0)
Platelets: 254 10*3/uL (ref 150–400)
RBC: 3.77 MIL/uL — ABNORMAL LOW (ref 3.87–5.11)
WBC: 11.3 10*3/uL — ABNORMAL HIGH (ref 4.0–10.5)

## 2011-09-24 LAB — HEPARIN LEVEL (UNFRACTIONATED): Heparin Unfractionated: 0.73 IU/mL — ABNORMAL HIGH (ref 0.30–0.70)

## 2011-09-24 LAB — PROTIME-INR
INR: 2.44 — ABNORMAL HIGH (ref 0.00–1.49)
Prothrombin Time: 26.9 seconds — ABNORMAL HIGH (ref 11.6–15.2)

## 2011-09-24 MED ORDER — WARFARIN SODIUM 5 MG PO TABS
5.0000 mg | ORAL_TABLET | Freq: Once | ORAL | Status: AC
  Administered 2011-09-24: 5 mg via ORAL
  Filled 2011-09-24: qty 1

## 2011-09-24 NOTE — Progress Notes (Signed)
Patient ID: Carrie [name], female   DOB: November 02, 1940, 71 y.o.   MRN: [id number]  Subjective: Patient sitting in chair at bedside. Confusion seems to be improving even more. Niece at bedside.  Objective: Vital signs in last 24 hours: Temp:  [97.6 F (36.4 C)-98.4 F (36.9 C)] 98.4 F (36.9 C) (01/03 0442) Pulse Rate:  [68-86] 71  (01/03 0442) Resp:  [12-29] 20  (01/03 0442) BP: (123-156)/(66-98) 133/79 mmHg (01/03 0442) SpO2:  [91 %-100 %] 98 % (01/03 0442) Weight:  [52.4 kg (115 lb 8.3 oz)] 115 lb 8.3 oz (52.4 kg) (01/03 0442) Weight change:  Last BM Date: 09/23/11  Filed Vitals:   09/23/11 1429 09/23/11 2029 09/23/11 2258 09/24/11 0442  BP: 145/67 149/67  133/79  Pulse: 68 74 86 71  Temp: 97.6 F (36.4 C) 97.8 F (36.6 C)  98.4 F (36.9 C)  TempSrc: Oral Oral  Oral  Resp: 20 18  20   Height:      Weight:    52.4 kg (115 lb 8.3 oz)  SpO2: 96% 97%  98%   Intake/Output from previous day: 01/02 0701 - 01/03 0700 In: 240 [P.O.:240] Out: 1200 [Urine:1200] Intake/Output this shift:   Physical Examination: Mental Status Exam: oriented to 2013, name, dob, but still have difficulty carrying on a conversation, tends to repeat herself and ideas, Cranial Nerve Exam: PUPILS equal reactive. EOMI. Facial symmetry is present. Extraocular movements are full. Visual fields shows some decrease blink to threat on the right  Motor Exam: The patient moves all 4 extremities  Deep tendon reflexes: Deep tendon reflexes are symmetric throughout. Toes are downgoing bilaterally.    Lab Results: Results for orders placed during the hospital encounter of 09/06/11 (from the past 24 hour(s))  CBC     Status: Abnormal   Collection Time   09/24/11  6:35 AM      Component Value Range   WBC 11.3 (*) 4.0 - 10.5 (K/uL)   RBC 3.77 (*) 3.87 - 5.11 (MIL/uL)   Hemoglobin 10.9 (*) 12.0 - 15.0 (g/dL)   HCT 62.1 (*) 30.8 - 46.0 (%)   MCV 86.7  78.0 - 100.0 (fL)   MCH 28.9  26.0 - 34.0 (pg)   MCHC 33.3  30.0 -  36.0 (g/dL)   RDW 65.7  84.6 - 96.2 (%)   Platelets 254  150 - 400 (K/uL)  HEPARIN LEVEL (UNFRACTIONATED)     Status: Abnormal   Collection Time   09/24/11  6:35 AM      Component Value Range   Heparin Unfractionated 0.84 (*) 0.30 - 0.70 (IU/mL)  PROTIME-INR     Status: Abnormal   Collection Time   09/24/11  6:35 AM      Component Value Range   Prothrombin Time 26.9 (*) 11.6 - 15.2 (seconds)   INR 2.44 (*) 0.00 - 1.49   COMPREHENSIVE METABOLIC PANEL     Status: Abnormal   Collection Time   09/24/11  6:35 AM      Component Value Range   Sodium 138  135 - 145 (mEq/L)   Potassium 3.9  3.5 - 5.1 (mEq/L)   Chloride 103  96 - 112 (mEq/L)   CO2 24  19 - 32 (mEq/L)   Glucose, Bld 114 (*) 70 - 99 (mg/dL)   BUN 11  6 - 23 (mg/dL)   Creatinine, Ser 9.52  0.50 - 1.10 (mg/dL)   Calcium 9.7  8.4 - 84.1 (mg/dL)   Total Protein 8.4 (*) 6.0 -  8.3 (g/dL)   Albumin 2.4 (*) 3.5 - 5.2 (g/dL)   AST 409 (*) 0 - 37 (U/L)   ALT 128 (*) 0 - 35 (U/L)   Alkaline Phosphatase 129 (*) 39 - 117 (U/L)   Total Bilirubin 0.3  0.3 - 1.2 (mg/dL)   GFR calc non Af Amer 88 (*) >90 (mL/min)   GFR calc Af Amer >90  >90 (mL/min)   Studies/Results: No results found.  Medications:  Scheduled:    . cefTRIAXone (ROCEPHIN)  IV  2 g Intravenous Q12H  . digoxin  0.125 mg Oral Daily  . famotidine  20 mg Oral BID  . lacosamide  100 mg Oral BID  . metoprolol tartrate  25 mg Oral BID  . warfarin  5 mg Oral ONCE-1800  . warfarin  7.5 mg Oral ONCE-1800   Continuous:    . sodium chloride 20 mL/hr at 09/22/11 0659  . heparin 1,550 Units/hr (09/24/11 0911)   WJX:BJYNWG chloride, acetaminophen, diphenhydrAMINE, diphenhydrAMINE, naloxone, ondansetron (ZOFRAN) IV, polyethylene glycol, sodium chloride, DISCONTD: butamben-tetracaine-benzocaine, DISCONTD: fentaNYL, DISCONTD: midazolam  EEG This is an abnormal awake and drowsy EEG due to the presence of mild diffuse background slowing. The finding may be suggestive of mild diffuse  cerebral dysfunction, may be due to toxic metabolic encephalopathy, neurodegenerative disorder, and/or bihemispheric structural abnormality. Single-channel EKG monitoring detected an irregular rhythm. If clinically warranted, clinical correlation is suggested.  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  Assessment/Plan: 1. Pneumococcal bacteremia and prosthetic valve endocarditis -cont current antibiotics as directed by ID - TEE unchanged.  -TCTS following - likely redo MVR eventually  2. Small subarachnoid hemorrhage (suspect post-traumatic vs. septic emboli), now resolved, back to heparin drip now,  Also on coumadin. Tolerating well.  3. Delirium, it was described during initial presentation, DDx, complex partial seizure vs encephalopathy, EEG without seizures. On po vimpat now, continue for now. Will stop at follow-up with neuro as an OP.  3. Hypertension  4. Atrial fibrillation, on IV heparin/coumadin  Recommend stopping IV heparin in the morning. Agree with SNF placement.   LOS: 18 days   Carrie [name], AVNP, ANP-BC, GNP-BC [facility] Pager: [phone] 09/24/2011 9:49 AM  Dr. Delia Heady, Stroke Center Medical Director, has personally reviewed chart, pertinent data, examined the patient and developed the plan of care.

## 2011-09-24 NOTE — Progress Notes (Signed)
Subjective: Chart reviewed. Patient denies complaints. According to her best friend was at the bedside, patient has significantly improved compared to Saturday and mental status is almost at baseline.  Objective: Blood pressure 148/72, pulse 61, temperature 97.3 F (36.3 C), temperature source Oral, resp. rate 20, height 5\' 1"  (1.549 m), weight 52.4 kg (115 lb 8.3 oz), SpO2 99.00%.  Intake/Output Summary (Last 24 hours) at 09/24/11 1900 Last data filed at 09/24/11 1854  Gross per 24 hour  Intake    960 ml  Output   1901 ml  Net   -941 ml    General Exam: Comfortable. Sitting up in chair and eating dinner. Respiratory System: Clear. No increased work of breathing. Cardiovascular System: First and second heart sounds heard. Click of mechanical valve heard. Regular rate and rhythm. No JVD/murmurs. Gastrointestinal System: Abdomen is non distended, soft and normal bowel sounds heard. Central Nervous System: Alert and oriented. No focal neurological deficits.  Lab Results: Basic Metabolic Panel:  Basename 09/24/11 0635 09/23/11 0615  NA 138 134*  K 3.9 3.6  CL 103 103  CO2 24 22  GLUCOSE 114* 108*  BUN 11 11  CREATININE 0.66 0.58  CALCIUM 9.7 9.4  MG -- --  PHOS -- --   Liver Function Tests:  Basename 09/24/11 0635 09/23/11 0615  AST 209* 234*  ALT 128* 137*  ALKPHOS 129* 134*  BILITOT 0.3 0.3  PROT 8.4* 8.1  ALBUMIN 2.4* 2.3*   No results found for this basename: LIPASE:2,AMYLASE:2 in the last 72 hours No results found for this basename: AMMONIA:2 in the last 72 hours CBC:  Basename 09/24/11 0635 09/23/11 0615  WBC 11.3* 10.9*  NEUTROABS -- --  HGB 10.9* 10.3*  HCT 32.7* 31.0*  MCV 86.7 85.9  PLT 254 243   Coagulation:  Basename 09/24/11 0635 09/23/11 0615  LABPROT 26.9* 21.4*  INR 2.44* 1.82*    Studies/Results: Ct Angio Head W/cm &/or Wo Cm  09/08/2011  *RADIOLOGY REPORT*  Clinical Data:  2-week history of fever and malaise.  Possible right common  carotid artery dissections seen on CT cervical spine.  CT ANGIOGRAPHY HEAD AND NECK  Technique:  Multidetector CT imaging of the head and neck was performed using the standard protocol during bolus administration of intravenous contrast.  Multiplanar CT image reconstructions including MIPs were obtained to evaluate the vascular anatomy. Carotid stenosis measurements (when applicable) are obtained utilizing NASCET criteria, using the distal internal carotid diameter as the denominator.  Contrast: OMNIPAQUE IOHEXOL 350 MG/ML IV SOLN an  Comparison:  CT head and C-spine earlier today.  CTA NECK  Findings:  There is reflux of contrast into the left internal jugular vein.  This may be related to the left sided pacemaker placement.  There is fair opacification of the craniocerebral vasculature.  Atheromatous change can be seen in the transverse arch.  There is considerable Hounsfield artifact from the pacemaker battery pack. There is no visible proximal great vessel dissection or stenosis.  Both carotid bifurcations are widely patent.  Mild nonstenotic calcific atheromatous change can be seen on the right.  Cervical internal carotid arteries tortuous but patent without dissection. Both vertebrals are patent and equal in size through the neck. Possible 15 x 16 mm right thyroid nodule.  Consider thyroid ultrasound for further evaluation.  Lung apices clear.   Review of the MIP images confirms the above findings.  IMPRESSION: No visible proximal great vessel dissection within limits of detection by artifact from pacemaker battery pack.  No evidence for flow reducing lesion in the right or left carotid system.  CTA HEAD  Findings:  Calcific nonstenotic atheromatous change of the skull base, cavernous, and supraclinoid internal carotid arteries.  No visible proximal stenosis of the anterior, middle, or posterior cerebral arteries.  Basilar artery patent without focal stenosis. No visible intracranial aneurysm.  Post  infusion imaging of the head reveals no abnormal enhancement.  Chronic left basal ganglia infarct redemonstrated.  Left frontal subarachnoid blood appears slightly greater than was seen earlier. It is possible a component of this increased conspicuity relates to enhancement postcontrast. Recommend repeat noncontrast examination for continued surveillance.   Review of the MIP images confirms the above findings.  IMPRESSION: No proximal carotid or basilar stenosis.  No visible intracranial dissection.  Suspect slight increase in left frontal subarachnoid blood. I cannot completely exclude that this increased prominence relates to a component of enhancement Recommend repeat noncontrast imaging in 24-48 hours.  Original Report Authenticated By: Elsie Stain, M.D.   Dg Chest 2 View  09/06/2011  *RADIOLOGY REPORT*  Clinical Data: Rule out infiltrate. Confusion.  CHEST - 2 VIEW  Comparison: 02/20/2008  Findings:  Heart size appears mildly enlarged.  Prior median sternotomy CABG procedure.  Left chest wall pacer device is noted with lead in the right ventricle.  There are no pleural effusions or interstitial edema.  Chronic bronchitic changes are noted bilaterally.  IMPRESSION:  1.  Cardiac enlargement. 2.  No acute changes.  Original Report Authenticated By: Rosealee Albee, M.D.   Ct Head Wo Contrast  09/16/2011  *RADIOLOGY REPORT*  Clinical Data: Follow up subarachnoid hemorrhage  CT HEAD WITHOUT CONTRAST  Technique:  Contiguous axial images were obtained from the base of the skull through the vertex without contrast.  Comparison: 09/13/2011  Findings: Near complete resolution of prior left frontal subarachnoid hemorrhage.  No parenchymal hemorrhage or new extra- axial hemorrhage is seen.  No mass lesion, mass effect, or midline shift.  Subcortical white matter and periventricular small vessel ischemic changes.  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  Mild global cortical atrophy.  No ventriculomegaly.  The  visualized paranasal sinuses are essentially clear. The mastoid air cells are unopacified.  No evidence of calvarial fracture.  IMPRESSION: No evidence of acute intracranial abnormality.  Near complete resolution of prior left frontal subarachnoid hemorrhage.  Original Report Authenticated By: Charline Bills, M.D.   Ct Head Wo Contrast  09/13/2011  *RADIOLOGY REPORT*  Clinical Data: New onset headache and neck soreness.  CT HEAD WITHOUT CONTRAST  Technique:  Contiguous axial images were obtained from the base of the skull through the vertex without contrast.  Comparison: Head CT scans 09/10/2011 and 09/09/2011.  Findings: Small amount of subarachnoid hemorrhage in the left frontal lobe continues to resolve.  No new hemorrhage is identified.  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  No midline shift, hydrocephalus or abnormal extra-axial fluid collection is identified.  IMPRESSION:  1.  No acute finding. 2.  Resolving subarachnoid hemorrhage on the left.  Original Report Authenticated By: Bernadene Bell. Maricela Curet, M.D.   Ct Head Wo Contrast  09/10/2011  *RADIOLOGY REPORT*  Clinical Data: Follow up subarachnoid hemorrhage  CT HEAD WITHOUT CONTRAST  Technique:  Contiguous axial images were obtained from the base of the skull through the vertex without contrast.  Comparison: 09/09/2011  Findings: Hyperdense subarachnoid hemorrhage over the convexity left frontal region has improved.  No new areas of hemorrhage are identified.  No subdural hemorrhage is  present.  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  No acute infarct.  Negative for mass lesion.  IMPRESSION: Small areas of subarachnoid hemorrhage over the convexity on the left have improved.  No new area of hemorrhage or infarction.  Original Report Authenticated By: Camelia Phenes, M.D.   Ct Head Wo Contrast  09/09/2011  *RADIOLOGY REPORT*  Clinical Data: Followup subarachnoid hemorrhage.  CT HEAD WITHOUT CONTRAST   Technique:  Contiguous axial images were obtained from the base of the skull through the vertex without contrast.  Comparison: 09/07/2011 CT and 11/10/2005 MR.  Findings: Compared to the 09/07/2011 noncontrast CT examination. There has been an increase in amount of subarachnoid blood in the left frontal region.  Etiology of this is indeterminate.  Result of venous thrombosis / infarct not excluded.  Nonspecific white matter type changes most notable left frontal region.  Remote left caudate head infarct with encephalomalacia. This is new compared to 2007 MR. No CT evidence of large acute infarct.  Small acute infarct cannot be excluded by CT.  No intracranial mass lesion detected on this unenhanced exam.  IMPRESSION: Compared to the 09/07/2011 noncontrast CT examination.  There has been an increase in amount of subarachnoid blood in the left frontal region.  Etiology of this is indeterminate. Please see above.  Original Report Authenticated By: Fuller Canada, M.D.   Ct Head Wo Contrast  09/07/2011  *RADIOLOGY REPORT*  Clinical Data: Headaches  CT HEAD WITHOUT CONTRAST  Technique:  Contiguous axial images were obtained from the base of the skull through the vertex without contrast.  Comparison: [place] MRI brain dated 11/10/2005  Findings: Tiny hyperdense foci overlying the left frontal lobe (series 2/images 14 and 22) and left medial frontal lobe (series 2/image 19), possibly reflecting subarachnoid hemorrhage.  No mass lesion, mass effect, or midline shift.  No CT evidence of acute infarction.  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  Subcortical white matter and periventricular small vessel ischemic changes.  Mildly age related atrophy.  No ventriculomegaly.  The visualized paranasal sinuses are essentially clear. The mastoid air cells are unopacified.  No evidence of calvarial fracture.  IMPRESSION: Possible tiny foci of subarachnoid hemorrhage overlying the left frontal lobe, as described above.  Old  lacunar infarct with small vessel ischemic changes  Critical Value/emergent results were called by telephone at the time of interpretation on 09/07/2011  at 1810 hours  to  Dr Delford Field, who verbally acknowledged these results.  Original Report Authenticated By: Charline Bills, M.D.   Ct Angio Neck W/cm &/or Wo/cm  09/08/2011  *RADIOLOGY REPORT*  Clinical Data:  2-week history of fever and malaise.  Possible right common carotid artery dissections seen on CT cervical spine.  CT ANGIOGRAPHY HEAD AND NECK  Technique:  Multidetector CT imaging of the head and neck was performed using the standard protocol during bolus administration of intravenous contrast.  Multiplanar CT image reconstructions including MIPs were obtained to evaluate the vascular anatomy. Carotid stenosis measurements (when applicable) are obtained utilizing NASCET criteria, using the distal internal carotid diameter as the denominator.  Contrast: OMNIPAQUE IOHEXOL 350 MG/ML IV SOLN an  Comparison:  CT head and C-spine earlier today.  CTA NECK  Findings:  There is reflux of contrast into the left internal jugular vein.  This may be related to the left sided pacemaker placement.  There is fair opacification of the craniocerebral vasculature.  Atheromatous change can be seen in the transverse arch.  There is considerable Hounsfield  artifact from the pacemaker battery pack. There is no visible proximal great vessel dissection or stenosis.  Both carotid bifurcations are widely patent.  Mild nonstenotic calcific atheromatous change can be seen on the right.  Cervical internal carotid arteries tortuous but patent without dissection. Both vertebrals are patent and equal in size through the neck. Possible 15 x 16 mm right thyroid nodule.  Consider thyroid ultrasound for further evaluation.  Lung apices clear.   Review of the MIP images confirms the above findings.  IMPRESSION: No visible proximal great vessel dissection within limits of detection by  artifact from pacemaker battery pack.  No evidence for flow reducing lesion in the right or left carotid system.  CTA HEAD  Findings:  Calcific nonstenotic atheromatous change of the skull base, cavernous, and supraclinoid internal carotid arteries.  No visible proximal stenosis of the anterior, middle, or posterior cerebral arteries.  Basilar artery patent without focal stenosis. No visible intracranial aneurysm.  Post infusion imaging of the head reveals no abnormal enhancement.  Chronic left basal ganglia infarct redemonstrated.  Left frontal subarachnoid blood appears slightly greater than was seen earlier. It is possible a component of this increased conspicuity relates to enhancement postcontrast. Recommend repeat noncontrast examination for continued surveillance.   Review of the MIP images confirms the above findings.  IMPRESSION: No proximal carotid or basilar stenosis.  No visible intracranial dissection.  Suspect slight increase in left frontal subarachnoid blood. I cannot completely exclude that this increased prominence relates to a component of enhancement Recommend repeat noncontrast imaging in 24-48 hours.  Original Report Authenticated By: Elsie Stain, M.D.   Ct Cervical Spine W Contrast  09/07/2011  *RADIOLOGY REPORT*  Clinical Data: C-spine tenderness, evaluate for fracture or abscess  CT CERVICAL SPINE WITH CONTRAST  Technique:  Multidetector CT imaging of the cervical spine was performed during intravenous contrast administration. Multiplanar CT image reconstructions were also generated.  Contrast: OMNIPAQUE IOHEXOL 300 MG/ML IV SOLN  Comparison: None.  Findings: Normal cervical lordosis.  No evidence of fracture or dislocation.  Vertebral body heights are maintained.  The dens appears intact.  No prevertebral soft tissue swelling.  Mild multilevel degenerative changes.  No epidural abscess/fluid collection is seen.  There is a possible linear filling defect within the right common  carotid artery (series 606/image 69, series 605/image 4), and although this may reflect artifact at the thoracic inlet, a focal dissection cannot be excluded.  IMPRESSION: Apparent linear filling defect within the right common carotid artery, possibly artifactual, although focal dissection cannot be excluded.  Consider CTA/MRA neck for further characterization as clinically warranted.  No evidence of fracture or dislocation.  No epidural abscess/fluid collection is seen.  Critical Value/emergent results were called by telephone at the time of interpretation on 09/07/2011  at 1810 hours  to  Dr Delford Field, who verbally acknowledged these results.  Original Report Authenticated By: Charline Bills, M.D.   Ct Thoracic Spine Wo Contrast  09/10/2011  *RADIOLOGY REPORT*  Clinical Data:  Leg weakness  CT THORACIC AND LUMBAR SPINE WITHOUT CONTRAST  Technique:  Multidetector CT imaging of the thoracic and lumbar spine was performed without contrast. Multiplanar CT image reconstructions were also generated.  Comparison:  None  CT THORACIC SPINE  Findings:  No fracture or dislocation is seen.  Mild multilevel degenerative changes of the thoracic spine, most prominent at T11-12.  Lower cervical spine is incompletely imaged.  Left paracentral disc herniation at C5-6 (series 9/image 14).  Visualized thyroid is unremarkable.  Cardiomegaly.  Coronary atherosclerosis.  Valve annuloplasty.  Atherosclerotic calcifications of the aortic arch.  Pacemaker leads.  Evaluation of the lung parenchyma is constrained by respiratory motion.  Scattered patchy left upper lobe opacities. Mild right middle lobe opacity / scarring.  Small bilateral pleural effusions with associated lower lobe atelectasis.  IMPRESSION: No fracture or dislocation is seen.  Mild multilevel degenerative changes, most prominent at T11-12.  Left paracentral disc herniation at C5-6.  Evaluation of the lung parenchyma is constrained by respiratory motion.  Patchy left  upper lobe and right middle lobe opacities. Small bilateral pleural effusions with associated lower lobe atelectasis.  CT LUMBAR SPINE  Findings: No fracture or dislocation is seen.  Very mild multilevel degenerative changes.  Atherosclerotic calcifications of the abdominal aorta and branch vessels.  Suspected calcified uterine fibroids.  IMPRESSION: No fracture or dislocation is seen.  Very mild multilevel degenerative changes.  Original Report Authenticated By: Charline Bills, M.D.   Ct Lumbar Spine Wo Contrast  09/10/2011  *RADIOLOGY REPORT*  Clinical Data:  Leg weakness  CT THORACIC AND LUMBAR SPINE WITHOUT CONTRAST  Technique:  Multidetector CT imaging of the thoracic and lumbar spine was performed without contrast. Multiplanar CT image reconstructions were also generated.  Comparison:  None  CT THORACIC SPINE  Findings:  No fracture or dislocation is seen.  Mild multilevel degenerative changes of the thoracic spine, most prominent at T11-12.  Lower cervical spine is incompletely imaged.  Left paracentral disc herniation at C5-6 (series 9/image 14).  Visualized thyroid is unremarkable.  Cardiomegaly.  Coronary atherosclerosis.  Valve annuloplasty.  Atherosclerotic calcifications of the aortic arch.  Pacemaker leads.  Evaluation of the lung parenchyma is constrained by respiratory motion.  Scattered patchy left upper lobe opacities. Mild right middle lobe opacity / scarring.  Small bilateral pleural effusions with associated lower lobe atelectasis.  IMPRESSION: No fracture or dislocation is seen.  Mild multilevel degenerative changes, most prominent at T11-12.  Left paracentral disc herniation at C5-6.  Evaluation of the lung parenchyma is constrained by respiratory motion.  Patchy left upper lobe and right middle lobe opacities. Small bilateral pleural effusions with associated lower lobe atelectasis.  CT LUMBAR SPINE  Findings: No fracture or dislocation is seen.  Very mild multilevel degenerative  changes.  Atherosclerotic calcifications of the abdominal aorta and branch vessels.  Suspected calcified uterine fibroids.  IMPRESSION: No fracture or dislocation is seen.  Very mild multilevel degenerative changes.  Original Report Authenticated By: Charline Bills, M.D.   Dg Chest Port 1 View  09/17/2011  *RADIOLOGY REPORT*  Clinical Data: Respiratory failure, shortness of breath  PORTABLE CHEST - 1 VIEW  Comparison: 09/11/2011; 09/09/2011; 09/08/2011; chest CT - 09/10/2011  Findings:  Grossly unchanged enlarged cardiac silhouette and mediastinal contours post median sternotomy and valve replacement given decreased lung volumes.  Grossly unchanged positioning of single lead pacemaker with tip overlying the expected location of the right ventricle.  Decreased lung volumes with corresponding interval increase in right perihilar heterogeneous opacities. Cephalization of flow with thickening along the right minor fissure.  No definite pleural effusion or pneumothorax.  Unchanged bones.  IMPRESSION: 1.  Decreased lung volumes with interval increase in right perihilar opacities to a represent atelectasis. 2.  Query mild pulmonary edema.  Original Report Authenticated By: Waynard Reeds, M.D.   Dg Chest Port 1 View  09/11/2011  *RADIOLOGY REPORT*  Clinical Data: Respiratory failure  PORTABLE CHEST - 1 VIEW  Comparison: 09/09/2011  Findings: Heart is moderately enlarged.  Single lead left subclavian pacemaker device unchanged.  Low volumes and hazy airspace opacities improved.  Stable mid lung right-sided linear opacity.  Normal pulmonary vascularity.  IMPRESSION: Improved aeration and airspace opacities.  Original Report Authenticated By: Donavan Burnet, M.D.   Dg Chest Port 1 View  09/09/2011  *RADIOLOGY REPORT*  Clinical Data: No edema, evaluate endotracheal tube position  PORTABLE CHEST - 1 VIEW  Comparison: Portable chest x-ray of 09/08/2011  Findings: No endotracheal tube is visible.  There is little  change in poor aeration and basilar volume loss.  Cardiomegaly is stable. 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  IMPRESSION: No endotracheal tube.  No change in poor aeration with basilar volume loss.  Original Report Authenticated By: Juline Patch, M.D.   Dg Chest Port 1 View  09/08/2011  *RADIOLOGY REPORT*  Clinical Data: Respiratory distress.  PORTABLE CHEST - 1 VIEW  Comparison: 09/06/2011  Findings: Cardiomegaly.  Mild vascular congestion.  Interstitial prominence could reflect interstitial edema.  This is increased slightly since prior study.  No effusions.  No acute bony abnormality.  IMPRESSION: Increasing interstitial prominence, question interstitial edema.  Original Report Authenticated By: Cyndie Chime, M.D.    Medications: Scheduled Meds:   . cefTRIAXone (ROCEPHIN)  IV  2 g Intravenous Q12H  . digoxin  0.125 mg Oral Daily  . famotidine  20 mg Oral BID  . lacosamide  100 mg Oral BID  . metoprolol tartrate  25 mg Oral BID  . warfarin  5 mg Oral ONCE-1800   Continuous Infusions:   . sodium chloride 20 mL/hr at 09/22/11 0659  . 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 Per cardiology, the vegetation is larger but not yet interfering with disc motion. Cardiology does recommend redo mitral valve replacement. She is on IV heparin bridging and Coumadin per pharmacy. INR is almost at goal. Surveillance blood cultures are negative. 2. Small subarachnoid hemorrhage, posttraumatic versus septic emboli. Resolved neurology and patient on anticoagulation which she is tolerating. 3. Encephalopathy on admission:? Secondary to seizures. On by mouth Vimpat 4. Atrial fibrillation: Controlled ventricular rate and  anticoagulated. 5. Hypertension 6. Status post permanent cardiac pacemaker 7. Anemia: Stable   Daylon Lafavor 09/24/2011, 7:00 PM

## 2011-09-24 NOTE — Progress Notes (Signed)
Clinical Social Worker provided pt and pt sister-in-law bed offers at bedside. Clinical Social Worker contacted pt husband on the telephone and provided pt husband with bed offers. Pt husband requested Clinical Social Worker contact pt other sister-in-law, Billey Chang. Clinical Social Worker contacted Ms. Clelia Croft and left a message. Clinical Social Worker to follow up with pt and pt family in regard to decision about SNF. Clinical Social Worker to facilitate pt discharge needs when pt medically ready for discharge.  Jacklynn Lewis, MSW, LCSWA  Clinical Social Work [phone]

## 2011-09-24 NOTE — Progress Notes (Signed)
Physical Therapy Treatment Patient Details Name: LEYDI WINSTEAD MRN: [id number] DOB: 11-23-1940 Today's Date: 09/24/2011  PT Assessment/Plan  PT - Assessment/Plan Comments on Treatment Session: Pt continues to make great progress. Pt still working on obtaining appropriate center of alignment with ambulation and decreasing Rt. LE adduction.  PT Plan: Discharge plan needs to be updated PT Frequency: Min 3X/week Follow Up Recommendations: 24 hour supervision/assistance;Skilled nursing facility Equipment Recommended: Defer to next venue PT Goals  Acute Rehab PT Goals PT Goal Formulation: With patient Time For Goal Achievement: 2 weeks Pt will go Supine/Side to Sit: with supervision Pt will go Sit to Stand: with min assist;with cues (comment type and amount) PT Goal: Sit to Stand - Progress: Met Pt will Transfer Bed to Chair/Chair to Bed: with min assist PT Transfer Goal: Bed to Chair/Chair to Bed - Progress: Progressing toward goal Pt will Ambulate: 16 - 50 feet;with min assist;with least restrictive assistive device PT Goal: Ambulate - Progress: Met  PT Treatment Precautions/Restrictions  Precautions Precautions: Fall Required Braces or Orthoses: No Restrictions Weight Bearing Restrictions: No Mobility (including Balance) Bed Mobility Bed Mobility: No (Seated at start) Transfers Transfers: Yes Sit to Stand: 4: Min assist Sit to Stand Details (indicate cue type and reason): Verbal cues for UE placement and appropriate body mechanics for standing. Pt requiring multiple attempts to stand Stand to Sit: 4: Min assist Stand to Sit Details: Tactile cues to place UEs on armrests for control of descent.  Ambulation/Gait Ambulation/Gait: Yes Ambulation/Gait Assistance: 4: Min assist Ambulation/Gait Assistance Details (indicate cue type and reason): PT to facilitate centered alignment and decreased Rt. Lateral lean. verbal and tactile cues to decrease Rt. LE adduction.  Ambulation Distance  (Feet): 210 Feet Assistive device: Rolling walker Gait Pattern: Decreased stride length;Step-through pattern;Trunk flexed  Posture/Postural Control Posture/Postural Control: Postural limitations Postural Limitations: Flexed posture overall.   Balance Balance Assessed: Yes Static Standing Balance Static Standing - Balance Support: No upper extremity supported Static Standing - Level of Assistance: 4: Min assist Static Standing - Comment/# of Minutes: 2-3 minutes total  Exercise  General Exercises - Lower Extremity Ankle Circles/Pumps: AROM;Both;15 reps;Seated End of Session PT - End of Session Equipment Utilized During Treatment: Gait belt Activity Tolerance: Patient tolerated treatment well Patient left: in chair;with call bell in reach Nurse Communication: Mobility status for ambulation General Behavior During Session: [facility] for tasks performed Cognition: Impaired (Improving, still with dec. memory)  Beverely Pace, Kerry Chisolm 09/24/2011, 12:30 PM  Sherie Don) Carleene Mains PT, DPT Acute Rehabilitation [phone]

## 2011-09-24 NOTE — Progress Notes (Signed)
We need to d/c foley today if it is not still indicated. I'm guessing it was placed to track strict i/o while pt was more confused??

## 2011-09-24 NOTE — Progress Notes (Signed)
[facility]  Subjective: Patient feeling better.  Objective: Vital signs in last 24 hours: Temp:  [97.6 F (36.4 C)-98.4 F (36.9 C)] 98.4 F (36.9 C) (01/03 0442) Pulse Rate:  [68-86] 71  (01/03 0442) Resp:  [12-29] 20  (01/03 0442) BP: (123-156)/(66-98) 133/79 mmHg (01/03 0442) SpO2:  [91 %-100 %] 98 % (01/03 0442) Weight:  [52.4 kg (115 lb 8.3 oz)] 115 lb 8.3 oz (52.4 kg) (01/03 0442) Last BM Date: 09/23/11  Intake/Output from previous day: 01/02 0701 - 01/03 0700 In: 240 [P.O.:240] Out: 1200 [Urine:1200] Intake/Output this shift:    Medications Current Facility-Administered Medications  Medication Dose Route Frequency Provider Last Rate Last Dose  . 0.9 %  sodium chloride infusion   Intravenous Continuous Billy Fischer, MD 20 mL/hr at 09/22/11 [phone]    . 0.9 %  sodium chloride infusion  250 mL Intravenous PRN Kalman Shan, MD   250 mL at 09/23/11 0133  . acetaminophen (TYLENOL) tablet 650 mg  650 mg Oral Q6H PRN Cliffton Asters, MD   650 mg at 09/24/11 0250  . cefTRIAXone (ROCEPHIN) 2 g in dextrose 5 % 50 mL IVPB  2 g Intravenous Q12H Cliffton Asters, MD   2 g at 09/24/11 0306  . digoxin (LANOXIN) tablet 0.125 mg  0.125 mg Oral Daily Eda Paschal [place], PA   0.125 mg at 09/23/11 0910  . diphenhydrAMINE (BENADRYL) injection 12.5 mg  12.5 mg Intravenous Q6H PRN Billy Fischer, MD       Or  . diphenhydrAMINE (BENADRYL) 12.5 MG/5ML elixir 12.5 mg  12.5 mg Oral Q6H PRN Billy Fischer, MD      . famotidine (PEPCID) tablet 20 mg  20 mg Oral BID Kalman Shan, MD   20 mg at 09/23/11 2258  . heparin ADULT infusion 100 units/ml (25000 units/250 ml)  1,550 Units/hr Intravenous Continuous Maryanna Shape [place], PHARMD 15.5 mL/hr at 09/24/11 0911 1,550 Units/hr at 09/24/11 0911  . lacosamide (VIMPAT) tablet 100 mg  100 mg Oral BID Annie Main, NP   100 mg at 09/23/11 2258  . metoprolol tartrate (LOPRESSOR) tablet 25 mg  25 mg Oral BID Billy Fischer, MD   25  mg at 09/23/11 2258  . naloxone [facility]) injection 0.4 mg  0.4 mg Intravenous PRN Billy Fischer, MD       And  . sodium chloride 0.9 % injection 9 mL  9 mL Intravenous PRN Billy Fischer, MD      . ondansetron [facility]) injection 4 mg  4 mg Intravenous Q6H PRN Billy Fischer, MD   4 mg at 09/20/11 1512  . polyethylene glycol (MIRALAX / GLYCOLAX) packet 17 g  17 g Oral Daily PRN Billy Fischer, MD   17 g at 09/14/11 1754  . warfarin (COUMADIN) tablet 5 mg  5 mg Oral ONCE-1800 [facility], [place]      . warfarin (COUMADIN) tablet 7.5 mg  7.5 mg Oral ONCE-1800 [facility], [place]   7.5 mg at 09/23/11 1703  . DISCONTD: butamben-tetracaine-benzocaine (CETACAINE) spray    PRN Lisette Abu Hilty   2 spray at 09/23/11 1308  . DISCONTD: fentaNYL (SUBLIMAZE) injection    PRN Lisette Abu. Hilty   12.5 mcg at 09/23/11 1320  . DISCONTD: midazolam (VERSED) injection    PRN Lisette Abu Hilty   1 mg at 09/23/11 1318     PE: General appearance: alert, cooperative and no distress Lungs: clear to auscultation bilaterally Heart: irregularly irregular rhythm Extremities: No LEE  Pulses: 2+ and symmetric Neurologic: Alert and oriented to place and year  Lab Results:   Basename 09/24/11 0635 09/23/11 0615 09/22/11 0533  WBC 11.3* 10.9* 12.4*  HGB 10.9* 10.3* 11.8*  HCT 32.7* 31.0* 34.0*  PLT 254 243 237   BMET  Basename 09/24/11 0635 09/23/11 0615 09/22/11 0533  NA 138 134* 132*  K 3.9 3.6 4.0  CL 103 103 100  CO2 24 22 22   GLUCOSE 114* 108* 114*  BUN 11 11 12   CREATININE 0.66 0.58 0.68  CALCIUM 9.7 9.4 9.4   PT/INR  Basename 09/24/11 0635 09/23/11 0615 09/22/11 0533  LABPROT 26.9* 21.4* 17.9*  INR 2.44* 1.82* 1.45   TEE 09/22/10 FINDINGS:  1. LEFT VENTRICLE: The left ventricle is normal in structure and function. There is moderate concentric LVH. The LVEF is >65%.  2. RIGHT VENTRICLE: The right ventricle is mildly dilated with normal function without any thrombus or  masses. There is pacemaker lead seen in the right ventricle  3. LEFT ATRIUM: The left atrium is dilated with spontaneous echo contrast, suggestive of a low-flow state.  4. LEFT ATRIAL APPENDAGE: Color and pulse doppler of the LAA suggest low flow, but no LAA thrombus was identified.  5. ATRIAL SEPTUM: The atrial septum is free of any thrombus or masses. There is no evidence for interatrial shunting by color doppler.  6. RIGHT ATRIUM: The right atrium is dilated without any thrombus or masses. A pacemaker lead is seen.  7. MITRAL VALVE: There is a St. Jude mitral valve with what appears to be a mobile, gelatinous mass of low echodensity attached to the tilting apparatus in the typical anterior mitral leaflet position, extending into the left atrium. This was visualized by 2D and 3D echo and appears to be unchanged in size compared to the previous reported study and likely represents thrombus/vegetation.  8. AORTIC VALVE: The aortic valve is mildly sclerotic without obvious vegetation. No insufficiency.  9. TRICUSPID VALVE: The tricuspid valve is normal in structure and function with moderate regurgitation.  10. PULMONIC VALVE: The pulmonic valve is normal in structure with trace regurgitation.  11. AORTIC ARCH, ASCENDING AND DESCENDING AORTA: There was no atherosclerosis of the proximal descending aorta or aortic arch, the proximal aortic arch was not well visualized.  IMPRESSION:  1. 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  Assessment/Plan  Principal Problem:  *Encephalopathy acute Active Problems:  Atrial fibrillation with rapid ventricular response  S/P mitral valve replacement, St Jude  Pneumococcal septicemia  Subarachnoid hemorrhage on CT this adm  Presence of permanent cardiac pacemaker  Chronic anticoagulation, ( INR goal 2.5-3.5 under normal circumstances for ST Jude MVR)  Endocarditis of prosthetic valve  Plan:  Persistent mitral valve  leaflet mass by TEE.  BP/HR controlled and stable.  Subtherapeutic INR(2.44).  Heparin to coumadin crossover.  Receiving rocephin.  Possible valve revision   LOS: 18 days    HAGER,BRYAN W 09/24/2011 10:17 AM  I have seen and examined the patient along with Wilburt Finlay, PA NP.  I have reviewed the chart, notes and new data.  I agree with PA/NP's note.  Key new complaints: making progress, improving subjectively. Afebrile. Key examination changes: Valve clicks are still sharp. INR near desired range (2.5-3.0). Key new findings / data: I have reviewed the TEE images from her two studies and in my opinion the vegetation is definitely larger.  It is not yet interfering with disc motion, but is definitely prolapsing into  the valve orifice.  PLAN: Will need to strongly consider redo MVR, despite high risk of redo surgery.  Thurmon Fair, MD, [facility] [phone] 09/24/2011, 2:57 PM

## 2011-09-24 NOTE — Progress Notes (Signed)
ANTICOAGULATION CONSULT NOTE - Follow Up Consult  Pharmacy Consult for Heparin and Coumadin Indication: St Jude MVR and Afib  Assessment:  71 yo female on Coumadin PTA for St. Jude MVR/Afib, restarted 12/29, now bridging with therapeutic heparin.  INR today is subtherapeutic but trending up quickly. Heparin level is SUPRAtherapeutic. Spoke with nurse who reports no problems with infusion or line. No bleeding issues reported.    Home Coumadin dose: 5mg  daily  Goal of Therapy:  Heparin level 0.3-0.7 units/ml INR 2.5-3.5   Plan:  1. Decrease heparin infusion to 1550 units/hr.  Check heparin level in 6 hrs.  2. Coumadin 5 mg PO x1 today. Check INR in AM.  Loura Back, PharmD, BCPS 09/24/2011 8:28 AM    Allergies  Allergen Reactions  . Codeine Nausea And Vomiting    Patient Measurements: Height: 5\' 1"  (154.9 cm) Weight: 115 lb 8.3 oz (52.4 kg) IBW/kg (Calculated) : 47.8   Vital Signs: Temp: 98.4 F (36.9 C) (01/03 0442) Temp src: Oral (01/03 0442) BP: 133/79 mmHg (01/03 0442) Pulse Rate: 71  (01/03 0442)  Labs:  Basename 09/24/11 1610 09/23/11 0615 09/22/11 0533  HGB 10.9* 10.3* --  HCT 32.7* 31.0* 34.0*  PLT 254 243 237  APTT -- -- --  LABPROT 26.9* 21.4* 17.9*  INR 2.44* 1.82* 1.45  HEPARINUNFRC 0.84* 0.41 0.39  CREATININE -- 0.58 0.68  CKTOTAL -- -- --  CKMB -- -- --  TROPONINI -- -- --   Estimated Creatinine Clearance: 50.1 ml/min (by C-G formula based on Cr of 0.58).   Medications:  Scheduled:     . cefTRIAXone (ROCEPHIN)  IV  2 g Intravenous Q12H  . digoxin  0.125 mg Oral Daily  . famotidine  20 mg Oral BID  . lacosamide  100 mg Oral BID  . metoprolol tartrate  25 mg Oral BID  . warfarin  7.5 mg Oral ONCE-1800  . DISCONTD: lacosamide (VIMPAT) IV  100 mg Intravenous Q12H

## 2011-09-24 NOTE — Progress Notes (Signed)
Occupational Therapy Treatment Patient Details Name: Carrie [name] MRN: [id number] DOB: 1941/04/21 Today's Date: 09/24/2011  OT Assessment/Plan OT Assessment/Plan Comments on Treatment Session: Less leaning to R noted in sitting and standing.  Maintains attention approximately 5 minutes for ADL.  Continues to make good progress.  SNF remains appropriate d/c setting.  Goals updated. OT Plan: Discharge plan remains appropriate OT Frequency: Min 1X/week Follow Up Recommendations: Skilled nursing facility Equipment Recommended: Defer to next venue OT Goals Acute Rehab OT Goals Time For Goal Achievement: 2 weeks ADL Goals Pt Will Perform Eating: with set-up;Sitting, chair ADL Goal: Eating - Progress: Met Pt Will Perform Grooming: Standing at sink;with supervision;Other (comment) (2 activities) ADL Goal: Grooming - Progress: Revised (modified due to lack of progress/goal met) Pt Will Perform Upper Body Bathing: Standing at sink;with min assist;Other (comment) ADL Goal: Upper Body Bathing - Progress: Not addressed Pt Will Perform Lower Body Bathing: with min assist;Sit to stand from bed;Sitting, edge of bed ADL Goal: Lower Body Bathing - Progress: Not met Pt Will Perform Upper Body Dressing: with supervision;Sitting, bed ADL Goal: Upper Body Dressing - Progress: Not met Pt Will Perform Lower Body Dressing: with supervision;Sitting, bed;Sit to stand from bed ADL Goal: Lower Body Dressing - Progress: Not met Pt Will Transfer to Toilet: with supervision;Regular height toilet ADL Goal: Toilet Transfer - Progress: Revised (modified due to lack of progress/goal met) Additional ADL Goal #1: Pt. will demonstrate sustained attention to simple ADL task x 5 mins. with no cues ADL Goal: Additional Goal #1 - Progress: Met  OT Treatment Precautions/Restrictions  Precautions Precautions: Fall   ADL ADL Eating/Feeding: Performed;Set up Where Assessed - Eating/Feeding: Chair Grooming: Performed;Teeth  care;Supervision/safety Grooming Details (indicate cue type and reason): stood x 4 minutes at sink with min guard assist Where Assessed - Grooming: Sitting at sink Toilet Transfer: Performed;Minimal assistance Toilet Transfer Method: Proofreader: Regular height toilet Equipment Used: Rolling walker ADL Comments: Pt just completed bathing and dressing with assist of nurse tech.  Reportedly washed her face and upper body with supervision. Mobility  Bed Mobility Bed Mobility: No Transfers Sit to Stand: 4: Min assist;With armrests;From chair/3-in-1 Stand to Sit: 5: Supervision;With upper extremity assist;To chair/3-in-1 End of Session OT - End of Session Equipment Utilized During Treatment: Gait belt Activity Tolerance: Patient tolerated treatment well Patient left: in chair;with call bell in reach General Behavior During Session: [facility] for tasks performed Cognition: Impaired (Repeatedly asking why she was hospitalized.)  Evern Bio  09/24/2011, 8:51 AM [phone]

## 2011-09-24 NOTE — Progress Notes (Signed)
ANTICOAGULATION CONSULT NOTE - Follow Up Consult  Pharmacy Consult for Heparin and Coumadin Indication: St. Jude MVR and Afib  Assessment: 71 yo F on heparin to Coumadin bridge. Heparin remains slightly above goal despite rate decrease earlier today. No bleeding noted.  Goal of Therapy:  Heparin level 0.3-0.7 units/ml   Plan:  1. Decrease heparin to 1500 units/hr (15 ml/hr). 2. F/U am heparin level  Loura Back Danielle 09/24/2011,4:38 PM  Labs:  Alvira Philips 09/24/11 1438 09/24/11 0635 09/23/11 0615 09/22/11 0533  HGB -- 10.9* 10.3* --  HCT -- 32.7* 31.0* 34.0*  PLT -- 254 243 237  APTT -- -- -- --  LABPROT -- 26.9* 21.4* 17.9*  INR -- 2.44* 1.82* 1.45  HEPARINUNFRC 0.73* 0.84* 0.41 --  CREATININE -- 0.66 0.58 0.68  CKTOTAL -- -- -- --  CKMB -- -- -- --  TROPONINI -- -- -- --   Estimated Creatinine Clearance: 50.1 ml/min (by C-G formula based on Cr of 0.66).

## 2011-09-25 DIAGNOSIS — I059 Rheumatic mitral valve disease, unspecified: Secondary | ICD-10-CM

## 2011-09-25 LAB — CBC
MCH: 29 pg (ref 26.0–34.0)
MCHC: 33.5 g/dL (ref 30.0–36.0)
RDW: 15.2 % (ref 11.5–15.5)

## 2011-09-25 LAB — PROTIME-INR: Prothrombin Time: 27.9 seconds — ABNORMAL HIGH (ref 11.6–15.2)

## 2011-09-25 LAB — HEPARIN LEVEL (UNFRACTIONATED): Heparin Unfractionated: 0.48 IU/mL (ref 0.30–0.70)

## 2011-09-25 MED ORDER — WARFARIN SODIUM 5 MG PO TABS
5.0000 mg | ORAL_TABLET | Freq: Once | ORAL | Status: AC
  Administered 2011-09-25: 5 mg via ORAL
  Filled 2011-09-25: qty 1

## 2011-09-25 NOTE — Progress Notes (Signed)
Physical Therapy Treatment Patient Details Name: Carrie [name] MRN: [id number] DOB: [birth year] Today's Date: 09/25/2011  PT Assessment/Plan  PT - Assessment/Plan Comments on Treatment Session: Pt continues with making good progress and was able to ambulate ~250' today with one seated rest break. Pt had one episode of urinary incontinence while trying to get to bathroom. While PT was attempting to get dry socks on pt she kept attempting to stand against commands due to risk of falls (this had been explained) Pt was demonstrating decreased safety awareness secondary to confusion. Pt continues to work on decreasing lateral instability with gait.  PT Plan: Discharge plan needs to be updated PT Frequency: Min 3X/week Follow Up Recommendations: 24 hour supervision/assistance;Skilled nursing facility Equipment Recommended: Defer to next venue PT Goals  Acute Rehab PT Goals PT Goal Formulation: With patient Time For Goal Achievement: 2 weeks Pt will go Supine/Side to Sit: with supervision Pt will go Sit to Stand: with cues (comment type and amount);with modified independence (progressed) PT Goal: Sit to Stand - Progress: Progressing toward goal Pt will Transfer Bed to Chair/Chair to Bed: with modified independence (progressed. ) PT Transfer Goal: Bed to Chair/Chair to Bed - Progress: Progressing toward goal Pt will Ambulate: 16 - 50 feet;with modified independence;with rolling walker (progressed) PT Goal: Ambulate - Progress: Progressing toward goal  PT Treatment Precautions/Restrictions  Precautions Precautions: Fall Precaution Comments: Secondary decreased cognition and weakness Required Braces or Orthoses: No Restrictions Weight Bearing Restrictions: No Mobility (including Balance) Bed Mobility Bed Mobility: No (Seated at start) Transfers Transfers: Yes Sit to Stand: 5: Supervision;With armrests;From chair/3-in-1;From toilet;With upper extremity assist Sit to Stand Details (indicate  cue type and reason): supervision for safety Stand to Sit: 5: Supervision;To chair/3-in-1;To toilet Stand to Sit Details: Supervision for safety.  Ambulation/Gait Ambulation/Gait: Yes Ambulation/Gait Assistance: 4: Min assist Ambulation/Gait Assistance Details (indicate cue type and reason): Assist for lateral losses of balance secondary to narrow base of support and excessive adduction of Bil. LEs at times.  Ambulation Distance (Feet): 250 Feet (with one seated rest break) Assistive device: Rolling walker Gait Pattern: Decreased stride length;Step-through pattern;Trunk flexed Gait velocity: Very slow gait with decreased speed with fatigue. Pt stops with visual scanning and challenges.  Stairs: No    Exercise  General Exercises - Lower Extremity Ankle Circles/Pumps: AROM;Both;15 reps;Seated Quad Sets: AROM;Both;15 reps;Seated Gluteal Sets: AROM;Both;Other reps (comment);Seated (20 reps) End of Session PT - End of Session Equipment Utilized During Treatment: Gait belt Activity Tolerance: Patient tolerated treatment well Patient left: in chair;with call bell in reach Nurse Communication: Mobility status for ambulation (Pt's low back pain) General Behavior During Session: [facility] for tasks performed Cognition: Impaired (Improving, still with dec. memory/processing/safety awarenes)  Sherie Don 09/25/2011, 1:44 PM  Sherie Don) Carleene Mains PT, DPT Acute Rehabilitation [phone]

## 2011-09-25 NOTE — Progress Notes (Signed)
Patient ID: Carrie [name], female   DOB: Nov 12, 1940, 71 y.o.   MRN: [id number]  Subjective: Patient sitting in chair at bedside. Confusion seems to be improving even more. Niece at bedside.  Objective: Vital signs in last 24 hours: Temp:  [97.3 F (36.3 C)-98.7 F (37.1 C)] 98.4 F (36.9 C) (01/04 0544) Pulse Rate:  [61-78] 75  (01/04 0912) Resp:  [20] 20  (01/04 0544) BP: (144-167)/(69-78) 144/77 mmHg (01/04 0912) SpO2:  [98 %-99 %] 99 % (01/04 0544) Weight:  [52.617 kg (116 lb)] 116 lb (52.617 kg) (01/04 0315) Weight change: 0.217 kg (7.7 oz) Last BM Date: 09/23/11  Filed Vitals:   09/24/11 2135 09/25/11 0315 09/25/11 0544 09/25/11 0912  BP: 159/78  167/69 144/77  Pulse: 78  62 75  Temp: 98.7 F (37.1 C)  98.4 F (36.9 C)   TempSrc: Oral  Oral   Resp: 20  20   Height:      Weight:  52.617 kg (116 lb)    SpO2: 98%  99%    Intake/Output from previous day: 01/03 0701 - 01/04 0700 In: 937.5 [P.O.:720; I.V.:167.5; IV Piggyback:50] Out: 951 [Urine:950; Stool:1] Intake/Output this shift: Total I/O In: 240 [P.O.:240] Out: -  Physical Examination: Mental Status Exam: oriented to 2013, name, dob, but still have difficulty carrying on a conversation, tends to repeat herself and ideas, Cranial Nerve Exam: PUPILS equal reactive. EOMI. Facial symmetry is present. Extraocular movements are full. Visual fields shows some decrease blink to threat on the right  Motor Exam: The patient moves all 4 extremities  Deep tendon reflexes: Deep tendon reflexes are symmetric throughout. Toes are downgoing bilaterally.    Lab Results: Results for orders placed during the hospital encounter of 09/06/11 (from the past 24 hour(s))  HEPARIN LEVEL (UNFRACTIONATED)     Status: Abnormal   Collection Time   09/24/11  2:38 PM      Component Value Range   Heparin Unfractionated 0.73 (*) 0.30 - 0.70 (IU/mL)  CBC     Status: Abnormal   Collection Time   09/25/11  7:36 AM      Component Value Range   WBC  12.4 (*) 4.0 - 10.5 (K/uL)   RBC 3.86 (*) 3.87 - 5.11 (MIL/uL)   Hemoglobin 11.2 (*) 12.0 - 15.0 (g/dL)   HCT 69.6 (*) 29.5 - 46.0 (%)   MCV 86.5  78.0 - 100.0 (fL)   MCH 29.0  26.0 - 34.0 (pg)   MCHC 33.5  30.0 - 36.0 (g/dL)   RDW 28.4  13.2 - 44.0 (%)   Platelets 292  150 - 400 (K/uL)  HEPARIN LEVEL (UNFRACTIONATED)     Status: Normal   Collection Time   09/25/11  7:36 AM      Component Value Range   Heparin Unfractionated 0.48  0.30 - 0.70 (IU/mL)  PROTIME-INR     Status: Abnormal   Collection Time   09/25/11  7:36 AM      Component Value Range   Prothrombin Time 27.9 (*) 11.6 - 15.2 (seconds)   INR 2.56 (*) 0.00 - 1.49    Studies/Results: No results found.  Medications:  Scheduled:     . cefTRIAXone (ROCEPHIN)  IV  2 g Intravenous Q12H  . digoxin  0.125 mg Oral Daily  . famotidine  20 mg Oral BID  . lacosamide  100 mg Oral BID  . metoprolol tartrate  25 mg Oral BID  . warfarin  5 mg Oral ONCE-1800  .  warfarin  5 mg Oral ONCE-1800   Continuous:     . sodium chloride 20 mL/hr at 09/22/11 0659  . DISCONTD: heparin Stopped (09/25/11 0925)   EXB:MWUXLK chloride, acetaminophen, diphenhydrAMINE, diphenhydrAMINE, naloxone, ondansetron (ZOFRAN) IV, polyethylene glycol, sodium chloride  EEG This is an abnormal awake and drowsy EEG due to the presence of mild diffuse background slowing. The finding may be suggestive of mild diffuse cerebral dysfunction, may be due to toxic metabolic encephalopathy, neurodegenerative disorder, and/or bihemispheric structural abnormality. Single-channel EKG monitoring detected an irregular rhythm. If clinically warranted, clinical correlation is suggested.  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  Assessment/Plan: 1. Pneumococcal bacteremia and prosthetic valve endocarditis -cont Rocephin for 6 week course as directed by ID - TEE vegetation on MVR slightly larger.  -TCTS following -  may need redo MVR if vegetation does not resolve  2. Small subarachnoid hemorrhage (suspect post-traumatic vs. septic emboli), now resolved, on heparin drip now,  Coumadin  at goal. 2.56. Tolerating well.  3. Delirium, it was described during initial presentation, DDx, complex partial seizure vs encephalopathy, EEG without seizures. On po vimpat now, as improved on Vimpat, will continue for now. Plan to stop at follow-up with neuro as an OP.  3. Hypertension  4. Atrial fibrillation, on IV heparin/coumadin  Recommend stopping IV heparin. Agree with SNF placement. Will sign off. Follow up with Dr. Pearlean Brownie in 2 months.    LOS: 19 days   SHARON BIBY, AVNP, ANP-BC, GNP-BC [facility] Pager: [phone] 09/25/2011 11:22 AM  Dr. Delia Heady, Stroke Center Medical Director, has personally reviewed chart, pertinent data, examined the patient and developed the plan of care.

## 2011-09-25 NOTE — Progress Notes (Signed)
ANTICOAGULATION CONSULT NOTE - Follow Up Consult  Pharmacy Consult for warfarin  Indication: atrial fibrillation and St. Jude MV  Allergies  Allergen Reactions  . Codeine Nausea And Vomiting    Patient Measurements: Height: 5\' 1"  (154.9 cm) Weight: 116 lb (52.617 kg) IBW/kg (Calculated) : 47.8    Vital Signs: Temp: 98.4 F (36.9 C) (01/04 0544) Temp src: Oral (01/04 0544) BP: 144/77 mmHg (01/04 0912) Pulse Rate: 75  (01/04 0912)  Labs:  Basename 09/25/11 0736 09/24/11 1438 09/24/11 0635 09/23/11 0615  HGB 11.2* -- 10.9* --  HCT 33.4* -- 32.7* 31.0*  PLT 292 -- 254 243  APTT -- -- -- --  LABPROT 27.9* -- 26.9* 21.4*  INR 2.56* -- 2.44* 1.82*  HEPARINUNFRC 0.48 0.73* 0.84* --  CREATININE -- -- 0.66 0.58  CKTOTAL -- -- -- --  CKMB -- -- -- --  TROPONINI -- -- -- --   Estimated Creatinine Clearance: 50.1 ml/min (by C-G formula based on Cr of 0.66).   Medications:  Scheduled:    . cefTRIAXone (ROCEPHIN)  IV  2 g Intravenous Q12H  . digoxin  0.125 mg Oral Daily  . famotidine  20 mg Oral BID  . lacosamide  100 mg Oral BID  . metoprolol tartrate  25 mg Oral BID  . 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 INR up to 2.56 with goal of 2.5 to 3.5.  Received 5mg  coumadin yesterday.  No bleeding noted.  H/h slightly low and platelets are fine.  Goal of Therapy:  INR goal of 2.5 to 3.5.   Plan:  Coumadin 5mg  today at 1800. Check INR in am. Begin patient teaching.  Walden Field B 09/25/2011,11:10 AM

## 2011-09-25 NOTE — Progress Notes (Signed)
Interim history  Patient is a 71 year old African American female which history of mitral valve replacement mechanical on anticoagulation with Coumadin and also atrial fibrillation who was transferred from [facility] service after being treated for pneumococcal sepsis as well as prosthetic valve endocarditis. Patient also has a history of atrial fibrillation. She had a positive blood culture and she is on IV Rocephin and also currently being anticoagulated goal INR being 2.5-3.5. TEE showed enlarging vegetation and may eventually need redo of mitral valve replacement. Ultimately, patient will need SNF placement after discharge. Duration of IV antibiotics is per infectious disease recommendation (please see note of 09/10/2011). Small subarachnoid hemorrhage suspected to be posttraumatic versus septic emboli but tolerating anticoagulation at this time. Delirium on initial presentation thought to be secondary to seizures versus encephalopathy but EEG did not show seizures. Mental status progressively and slowly improving.   Subjective: Patient received a call from her husband saying that he's not doing so well at home and has not taken his medications. Patient seems to be slightly anxious and upset.  Objective: Blood pressure 169/93, pulse 68, temperature 98.2 F (36.8 C), temperature source Oral, resp. rate 18, height 5\' 1"  (1.549 m), weight 52.617 kg (116 lb), SpO2 98.00%.  Intake/Output Summary (Last 24 hours) at 09/25/11 1741 Last data filed at 09/25/11 1500  Gross per 24 hour  Intake  937.5 ml  Output    250 ml  Net  687.5 ml    General Exam: Comfortable. Sitting up in chair and slightly anxious. Respiratory System: Clear. No increased work of breathing. Cardiovascular System: First and second heart sounds heard. Click of mechanical valve heard. Regular rate and rhythm. No JVD/murmurs. Gastrointestinal System: Abdomen is non distended, soft and normal bowel sounds heard. Central Nervous System:  Alert and oriented. No focal neurological deficits.  Lab Results: Basic Metabolic Panel:  Basename 09/24/11 0635 09/23/11 0615  NA 138 134*  K 3.9 3.6  CL 103 103  CO2 24 22  GLUCOSE 114* 108*  BUN 11 11  CREATININE 0.66 0.58  CALCIUM 9.7 9.4  MG -- --  PHOS -- --   Liver Function Tests:  [facility] 09/24/11 0635 09/23/11 0615  AST 209* 234*  ALT 128* 137*  ALKPHOS 129* 134*  BILITOT 0.3 0.3  PROT 8.4* 8.1  ALBUMIN 2.4* 2.3*   CBC:  Basename 09/25/11 0736 09/24/11 0635  WBC 12.4* 11.3*  NEUTROABS -- --  HGB 11.2* 10.9*  HCT 33.4* 32.7*  MCV 86.5 86.7  PLT 292 254   Coagulation:  Basename 09/25/11 0736 09/24/11 0635  LABPROT 27.9* 26.9*  INR 2.56* 2.44*    Studies/Results: Ct Angio Head W/cm &/or Wo Cm  09/08/2011  *RADIOLOGY REPORT*  Clinical Data:  2-week history of fever and malaise.  Possible right common carotid artery dissections seen on CT cervical spine.  CT ANGIOGRAPHY HEAD AND NECK  Technique:  Multidetector CT imaging of the head and neck was performed using the standard protocol during bolus administration of intravenous contrast.  Multiplanar CT image reconstructions including MIPs were obtained to evaluate the vascular anatomy. Carotid stenosis measurements (when applicable) are obtained utilizing NASCET criteria, using the distal internal carotid diameter as the denominator.  Contrast: OMNIPAQUE IOHEXOL 350 MG/ML IV SOLN an  Comparison:  CT head and C-spine earlier today.  CTA NECK  Findings:  There is reflux of contrast into the left internal jugular vein.  This may be related to the left sided pacemaker placement.  There is fair opacification of  the craniocerebral vasculature.  Atheromatous change can be seen in the transverse arch.  There is considerable Hounsfield artifact from the pacemaker battery pack. There is no visible proximal great vessel dissection or stenosis.  Both carotid bifurcations are widely patent.  Mild nonstenotic calcific  atheromatous change can be seen on the right.  Cervical internal carotid arteries tortuous but patent without dissection. Both vertebrals are patent and equal in size through the neck. Possible 15 x 16 mm right thyroid nodule.  Consider thyroid ultrasound for further evaluation.  Lung apices clear.   Review of the MIP images confirms the above findings.  IMPRESSION: No visible proximal great vessel dissection within limits of detection by artifact from pacemaker battery pack.  No evidence for flow reducing lesion in the right or left carotid system.  CTA HEAD  Findings:  Calcific nonstenotic atheromatous change of the skull base, cavernous, and supraclinoid internal carotid arteries.  No visible proximal stenosis of the anterior, middle, or posterior cerebral arteries.  Basilar artery patent without focal stenosis. No visible intracranial aneurysm.  Post infusion imaging of the head reveals no abnormal enhancement.  Chronic left basal ganglia infarct redemonstrated.  Left frontal subarachnoid blood appears slightly greater than was seen earlier. It is possible a component of this increased conspicuity relates to enhancement postcontrast. Recommend repeat noncontrast examination for continued surveillance.   Review of the MIP images confirms the above findings.  IMPRESSION: No proximal carotid or basilar stenosis.  No visible intracranial dissection.  Suspect slight increase in left frontal subarachnoid blood. I cannot completely exclude that this increased prominence relates to a component of enhancement Recommend repeat noncontrast imaging in 24-48 hours.  Original Report Authenticated By: Elsie Stain, M.D.   Dg Chest 2 View  09/06/2011  *RADIOLOGY REPORT*  Clinical Data: Rule out infiltrate. Confusion.  CHEST - 2 VIEW  Comparison: 02/20/2008  Findings:  Heart size appears mildly enlarged.  Prior median sternotomy CABG procedure.  Left chest wall pacer device is noted with lead in the right ventricle.  There  are no pleural effusions or interstitial edema.  Chronic bronchitic changes are noted bilaterally.  IMPRESSION:  1.  Cardiac enlargement. 2.  No acute changes.  Original Report Authenticated By: Rosealee Albee, M.D.   Ct Head Wo Contrast  09/16/2011  *RADIOLOGY REPORT*  Clinical Data: Follow up subarachnoid hemorrhage  CT HEAD WITHOUT CONTRAST  Technique:  Contiguous axial images were obtained from the base of the skull through the vertex without contrast.  Comparison: 09/13/2011  Findings: Near complete resolution of prior left frontal subarachnoid hemorrhage.  No parenchymal hemorrhage or new extra- axial hemorrhage is seen.  No mass lesion, mass effect, or midline shift.  Subcortical white matter and periventricular small vessel ischemic changes.  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  Mild global cortical atrophy.  No ventriculomegaly.  The visualized paranasal sinuses are essentially clear. The mastoid air cells are unopacified.  No evidence of calvarial fracture.  IMPRESSION: No evidence of acute intracranial abnormality.  Near complete resolution of prior left frontal subarachnoid hemorrhage.  Original Report Authenticated By: Charline Bills, M.D.   Ct Head Wo Contrast  09/13/2011  *RADIOLOGY REPORT*  Clinical Data: New onset headache and neck soreness.  CT HEAD WITHOUT CONTRAST  Technique:  Contiguous axial images were obtained from the base of the skull through the vertex without contrast.  Comparison: Head CT scans 09/10/2011 and 09/09/2011.  Findings: Small amount of subarachnoid hemorrhage in the left frontal lobe continues to resolve.  No new hemorrhage is identified.  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  No midline shift, hydrocephalus or abnormal extra-axial fluid collection is identified.  IMPRESSION:  1.  No acute finding. 2.  Resolving subarachnoid hemorrhage on the left.  Original Report Authenticated By: Bernadene Bell. Maricela Curet, M.D.   Ct Head Wo  Contrast  09/10/2011  *RADIOLOGY REPORT*  Clinical Data: Follow up subarachnoid hemorrhage  CT HEAD WITHOUT CONTRAST  Technique:  Contiguous axial images were obtained from the base of the skull through the vertex without contrast.  Comparison: 09/09/2011  Findings: Hyperdense subarachnoid hemorrhage over the convexity left frontal region has improved.  No new areas of hemorrhage are identified.  No subdural hemorrhage is present.  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  No acute infarct.  Negative for mass lesion.  IMPRESSION: Small areas of subarachnoid hemorrhage over the convexity on the left have improved.  No new area of hemorrhage or infarction.  Original Report Authenticated By: Camelia Phenes, M.D.   Ct Head Wo Contrast  09/09/2011  *RADIOLOGY REPORT*  Clinical Data: Followup subarachnoid hemorrhage.  CT HEAD WITHOUT CONTRAST  Technique:  Contiguous axial images were obtained from the base of the skull through the vertex without contrast.  Comparison: 09/07/2011 CT and 11/10/2005 MR.  Findings: Compared to the 09/07/2011 noncontrast CT examination. There has been an increase in amount of subarachnoid blood in the left frontal region.  Etiology of this is indeterminate.  Result of venous thrombosis / infarct not excluded.  Nonspecific white matter type changes most notable left frontal region.  Remote left caudate head infarct with encephalomalacia. This is new compared to 2007 MR. No CT evidence of large acute infarct.  Small acute infarct cannot be excluded by CT.  No intracranial mass lesion detected on this unenhanced exam.  IMPRESSION: Compared to the 09/07/2011 noncontrast CT examination.  There has been an increase in amount of subarachnoid blood in the left frontal region.  Etiology of this is indeterminate. Please see above.  Original Report Authenticated By: Fuller Canada, M.D.   Ct Head Wo Contrast  09/07/2011  *RADIOLOGY REPORT*  Clinical Data: Headaches  CT HEAD  WITHOUT CONTRAST  Technique:  Contiguous axial images were obtained from the base of the skull through the vertex without contrast.  Comparison: [place] [facility] brain dated 11/10/2005  Findings: Tiny hyperdense foci overlying the left frontal lobe (series 2/images 14 and 22) and left medial frontal lobe (series 2/image 19), possibly reflecting subarachnoid hemorrhage.  No mass lesion, mass effect, or midline shift.  No CT evidence of acute infarction.  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  Subcortical white matter and periventricular small vessel ischemic changes.  Mildly age related atrophy.  No ventriculomegaly.  The visualized paranasal sinuses are essentially clear. The mastoid air cells are unopacified.  No evidence of calvarial fracture.  IMPRESSION: Possible tiny foci of subarachnoid hemorrhage overlying the left frontal lobe, as described above.  Old lacunar infarct with small vessel ischemic changes  Critical Value/emergent results were called by telephone at the time of interpretation on 09/07/2011  at 1810 hours  to  Dr Delford Field, who verbally acknowledged these results.  Original Report Authenticated By: Charline Bills, M.D.   Ct Angio Neck W/cm &/or Wo/cm  09/08/2011  *RADIOLOGY REPORT*  Clinical Data:  2-week history of fever and malaise.  Possible right common carotid artery dissections seen on CT cervical spine.  CT ANGIOGRAPHY HEAD AND NECK  Technique:  Multidetector  CT imaging of the head and neck was performed using the standard protocol during bolus administration of intravenous contrast.  Multiplanar CT image reconstructions including MIPs were obtained to evaluate the vascular anatomy. Carotid stenosis measurements (when applicable) are obtained utilizing NASCET criteria, using the distal internal carotid diameter as the denominator.  Contrast: OMNIPAQUE IOHEXOL 350 MG/ML IV SOLN an  Comparison:  CT head and C-spine earlier today.  CTA NECK  Findings:  There is reflux of contrast  into the left internal jugular vein.  This may be related to the left sided pacemaker placement.  There is fair opacification of the craniocerebral vasculature.  Atheromatous change can be seen in the transverse arch.  There is considerable Hounsfield artifact from the pacemaker battery pack. There is no visible proximal great vessel dissection or stenosis.  Both carotid bifurcations are widely patent.  Mild nonstenotic calcific atheromatous change can be seen on the right.  Cervical internal carotid arteries tortuous but patent without dissection. Both vertebrals are patent and equal in size through the neck. Possible 15 x 16 mm right thyroid nodule.  Consider thyroid ultrasound for further evaluation.  Lung apices clear.   Review of the MIP images confirms the above findings.  IMPRESSION: No visible proximal great vessel dissection within limits of detection by artifact from pacemaker battery pack.  No evidence for flow reducing lesion in the right or left carotid system.  CTA HEAD  Findings:  Calcific nonstenotic atheromatous change of the skull base, cavernous, and supraclinoid internal carotid arteries.  No visible proximal stenosis of the anterior, middle, or posterior cerebral arteries.  Basilar artery patent without focal stenosis. No visible intracranial aneurysm.  Post infusion imaging of the head reveals no abnormal enhancement.  Chronic left basal ganglia infarct redemonstrated.  Left frontal subarachnoid blood appears slightly greater than was seen earlier. It is possible a component of this increased conspicuity relates to enhancement postcontrast. Recommend repeat noncontrast examination for continued surveillance.   Review of the MIP images confirms the above findings.  IMPRESSION: No proximal carotid or basilar stenosis.  No visible intracranial dissection.  Suspect slight increase in left frontal subarachnoid blood. I cannot completely exclude that this increased prominence relates to a component  of enhancement Recommend repeat noncontrast imaging in 24-48 hours.  Original Report Authenticated By: Elsie Stain, M.D.   Ct Cervical Spine W Contrast  09/07/2011  *RADIOLOGY REPORT*  Clinical Data: C-spine tenderness, evaluate for fracture or abscess  CT CERVICAL SPINE WITH CONTRAST  Technique:  Multidetector CT imaging of the cervical spine was performed during intravenous contrast administration. Multiplanar CT image reconstructions were also generated.  Contrast: OMNIPAQUE IOHEXOL 300 MG/ML IV SOLN  Comparison: None.  Findings: Normal cervical lordosis.  No evidence of fracture or dislocation.  Vertebral body heights are maintained.  The dens appears intact.  No prevertebral soft tissue swelling.  Mild multilevel degenerative changes.  No epidural abscess/fluid collection is seen.  There is a possible linear filling defect within the right common carotid artery (series 606/image 69, series 605/image 4), and although this may reflect artifact at the thoracic inlet, a focal dissection cannot be excluded.  IMPRESSION: Apparent linear filling defect within the right common carotid artery, possibly artifactual, although focal dissection cannot be excluded.  Consider CTA/MRA neck for further characterization as clinically warranted.  No evidence of fracture or dislocation.  No epidural abscess/fluid collection is seen.  Critical Value/emergent results were called by telephone at the time of interpretation on 09/07/2011  at 1810 hours  to  Dr Delford Field, who verbally acknowledged these results.  Original Report Authenticated By: Charline Bills, M.D.   Ct Thoracic Spine Wo Contrast  09/10/2011  *RADIOLOGY REPORT*  Clinical Data:  Leg weakness  CT THORACIC AND LUMBAR SPINE WITHOUT CONTRAST  Technique:  Multidetector CT imaging of the thoracic and lumbar spine was performed without contrast. Multiplanar CT image reconstructions were also generated.  Comparison:  None  CT THORACIC SPINE  Findings:  No  fracture or dislocation is seen.  Mild multilevel degenerative changes of the thoracic spine, most prominent at T11-12.  Lower cervical spine is incompletely imaged.  Left paracentral disc herniation at C5-6 (series 9/image 14).  Visualized thyroid is unremarkable.  Cardiomegaly.  Coronary atherosclerosis.  Valve annuloplasty.  Atherosclerotic calcifications of the aortic arch.  Pacemaker leads.  Evaluation of the lung parenchyma is constrained by respiratory motion.  Scattered patchy left upper lobe opacities. Mild right middle lobe opacity / scarring.  Small bilateral pleural effusions with associated lower lobe atelectasis.  IMPRESSION: No fracture or dislocation is seen.  Mild multilevel degenerative changes, most prominent at T11-12.  Left paracentral disc herniation at C5-6.  Evaluation of the lung parenchyma is constrained by respiratory motion.  Patchy left upper lobe and right middle lobe opacities. Small bilateral pleural effusions with associated lower lobe atelectasis.  CT LUMBAR SPINE  Findings: No fracture or dislocation is seen.  Very mild multilevel degenerative changes.  Atherosclerotic calcifications of the abdominal aorta and branch vessels.  Suspected calcified uterine fibroids.  IMPRESSION: No fracture or dislocation is seen.  Very mild multilevel degenerative changes.  Original Report Authenticated By: Charline Bills, M.D.   Ct Lumbar Spine Wo Contrast  09/10/2011  *RADIOLOGY REPORT*  Clinical Data:  Leg weakness  CT THORACIC AND LUMBAR SPINE WITHOUT CONTRAST  Technique:  Multidetector CT imaging of the thoracic and lumbar spine was performed without contrast. Multiplanar CT image reconstructions were also generated.  Comparison:  None  CT THORACIC SPINE  Findings:  No fracture or dislocation is seen.  Mild multilevel degenerative changes of the thoracic spine, most prominent at T11-12.  Lower cervical spine is incompletely imaged.  Left paracentral disc herniation at C5-6 (series 9/image  14).  Visualized thyroid is unremarkable.  Cardiomegaly.  Coronary atherosclerosis.  Valve annuloplasty.  Atherosclerotic calcifications of the aortic arch.  Pacemaker leads.  Evaluation of the lung parenchyma is constrained by respiratory motion.  Scattered patchy left upper lobe opacities. Mild right middle lobe opacity / scarring.  Small bilateral pleural effusions with associated lower lobe atelectasis.  IMPRESSION: No fracture or dislocation is seen.  Mild multilevel degenerative changes, most prominent at T11-12.  Left paracentral disc herniation at C5-6.  Evaluation of the lung parenchyma is constrained by respiratory motion.  Patchy left upper lobe and right middle lobe opacities. Small bilateral pleural effusions with associated lower lobe atelectasis.  CT LUMBAR SPINE  Findings: No fracture or dislocation is seen.  Very mild multilevel degenerative changes.  Atherosclerotic calcifications of the abdominal aorta and branch vessels.  Suspected calcified uterine fibroids.  IMPRESSION: No fracture or dislocation is seen.  Very mild multilevel degenerative changes.  Original Report Authenticated By: Charline Bills, M.D.   Dg Chest Port 1 View  09/17/2011  *RADIOLOGY REPORT*  Clinical Data: Respiratory failure, shortness of breath  PORTABLE CHEST - 1 VIEW  Comparison: 09/11/2011; 09/09/2011; 09/08/2011; chest CT - 09/10/2011  Findings:  Grossly unchanged enlarged cardiac silhouette and mediastinal contours post median sternotomy and valve  replacement given decreased lung volumes.  Grossly unchanged positioning of single lead pacemaker with tip overlying the expected location of the right ventricle.  Decreased lung volumes with corresponding interval increase in right perihilar heterogeneous opacities. Cephalization of flow with thickening along the right minor fissure.  No definite pleural effusion or pneumothorax.  Unchanged bones.  IMPRESSION: 1.  Decreased lung volumes with interval increase in right  perihilar opacities to a represent atelectasis. 2.  Query mild pulmonary edema.  Original Report Authenticated By: Waynard Reeds, M.D.   Dg Chest Port 1 View  09/11/2011  *RADIOLOGY REPORT*  Clinical Data: Respiratory failure  PORTABLE CHEST - 1 VIEW  Comparison: 09/09/2011  Findings: Heart is moderately enlarged.  Single lead left subclavian pacemaker device unchanged.  Low volumes and hazy airspace opacities improved.  Stable mid lung right-sided linear opacity.  Normal pulmonary vascularity.  IMPRESSION: Improved aeration and airspace opacities.  Original Report Authenticated By: Donavan Burnet, M.D.   Dg Chest Port 1 View  09/09/2011  *RADIOLOGY REPORT*  Clinical Data: No edema, evaluate endotracheal tube position  PORTABLE CHEST - 1 VIEW  Comparison: Portable chest x-ray of 09/08/2011  Findings: No endotracheal tube is visible.  There is little change in poor aeration and basilar volume loss.  Cardiomegaly is stable. 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  IMPRESSION: No endotracheal tube.  No change in poor aeration with basilar volume loss.  Original Report Authenticated By: Juline Patch, M.D.   Dg Chest Port 1 View  09/08/2011  *RADIOLOGY REPORT*  Clinical Data: Respiratory distress.  PORTABLE CHEST - 1 VIEW  Comparison: 09/06/2011  Findings: Cardiomegaly.  Mild vascular congestion.  Interstitial prominence could reflect interstitial edema.  This is increased slightly since prior study.  No effusions.  No acute bony abnormality.  IMPRESSION: Increasing interstitial prominence, question interstitial edema.  Original Report Authenticated By: Cyndie Chime, M.D.    Medications: Scheduled Meds:    . cefTRIAXone (ROCEPHIN)  IV  2 g Intravenous Q12H  . digoxin  0.125 mg Oral Daily  . famotidine  20 mg Oral BID  . lacosamide  100 mg Oral BID  . metoprolol tartrate  25 mg Oral BID  . warfarin  5 mg Oral ONCE-1800  . warfarin  5 mg Oral ONCE-1800   Continuous Infusions:     . sodium chloride 20 mL/hr at 09/22/11 0659  . DISCONTD: heparin Stopped (09/25/11 0925)   PRN Meds:.sodium chloride, acetaminophen, diphenhydrAMINE, diphenhydrAMINE, naloxone, ondansetron (ZOFRAN) IV, polyethylene glycol, sodium chloride  Assessment/Plan: 1. Pneumococcal bacteremia and prosthetic valve endocarditis. Continue Rocephin and the dose and duration is as per infectious disease recommendation. Per cardiology, the vegetation is larger but not yet interfering with disc motion. Cardiology does recommend redo mitral valve replacement. Heparin discontinued and Coumadin per pharmacy. INR is at goal. Surveillance blood cultures are negative. 2. Small subarachnoid hemorrhage, posttraumatic versus septic emboli. Resolved and patient on anticoagulation which she is tolerating. 3. Encephalopathy on admission:? Secondary to seizures. On by mouth Vimpat. Mental status continues to slowly and gradually improve. 4. Atrial fibrillation: Controlled ventricular rate and anticoagulated. 5. Hypertension 6. Status post permanent cardiac pacemaker 7. Anemia: Stable  Disposition: Patient approaching stability for discharge and for possible discharge to skilled nursing facility in the next 24-48 hours.  Kary Colaizzi 09/25/2011, 5:41 PM

## 2011-09-25 NOTE — Consult Note (Signed)
[address]            [place] [id number]          [phone]    TCTS DAILY PROGRESS NOTE                   [address]            [place] [address]          [phone]      2 Days Post-Op Procedure(s) (LRB): TRANSESOPHAGEAL ECHOCARDIOGRAM (TEE) (N/A)  Total Length of Stay:  LOS: 19 days   Subjective: Confused but better then when seen 10 days ago  Objective: Vital signs in last 24 hours: Patient Vitals for the past 24 hrs:  BP Temp Temp src Pulse Resp SpO2 Weight  09/25/11 1500 169/93 mmHg 98.2 F (36.8 C) - 68  18  98 % -  09/25/11 0912 144/77 mmHg - - 75  - - -  09/25/11 0544 167/69 mmHg 98.4 F (36.9 C) Oral 62  20  99 % -  09/25/11 0315 - - - - - - 116 lb (52.617 kg)  09/24/11 2135 159/78 mmHg 98.7 F (37.1 C) Oral 78  20  98 % -   Wt Readings from Last 3 Encounters:  09/25/11 116 lb (52.617 kg)  09/25/11 116 lb (52.617 kg)  09/25/11 116 lb (52.617 kg)    Hemodynamic parameters for last 24 hours:    Intake/Output from previous day: 01/03 0701 - 01/04 0700 In: 937.5 [P.O.:720; I.V.:167.5; IV Piggyback:50] Out: 951 [Urine:950; Stool:1]  Intake/Output this shift: Total I/O In: 480 [P.O.:480] Out: -   Current Meds: Scheduled Meds:   . cefTRIAXone (ROCEPHIN)  IV  2 g Intravenous Q12H  . digoxin  0.125 mg Oral Daily  . famotidine  20 mg Oral BID  . lacosamide  100 mg Oral BID  . metoprolol tartrate  25 mg Oral BID  . warfarin  5 mg Oral ONCE-1800   Continuous Infusions:   . sodium chloride 20 mL/hr at 09/22/11 0659  . DISCONTD: heparin Stopped (09/25/11 0925)   PRN Meds:.sodium chloride, acetaminophen, diphenhydrAMINE, diphenhydrAMINE, naloxone, ondansetron (ZOFRAN) IV, polyethylene glycol, sodium chloride  General appearance: alert, appears older than stated age, no distress and slowed mentation Heart: irregularly irregular rhythm, no rub and valve click present Lungs: clear to  auscultation bilaterally Abdomen: soft, non-tender; bowel sounds normal; no masses,  no organomegaly  Lab Results: CBC: Basename 09/25/11 0736 09/24/11 0635  WBC 12.4* 11.3*  HGB 11.2* 10.9*  HCT 33.4* 32.7*  PLT 292 254   BMET:  Basename 09/24/11 0635 09/23/11 0615  NA 138 134*  K 3.9 3.6  CL 103 103  CO2 24 22  GLUCOSE 114* 108*  BUN 11 11  CREATININE 0.66 0.58  CALCIUM 9.7 9.4    PT/INR:  Basename 09/25/11 0736  LABPROT 27.9*  INR 2.56*   Radiology: No results found.   Assessment/Plan: S/P Procedure(s) (LRB): TRANSESOPHAGEAL ECHOCARDIOGRAM (TEE) (N/A)  Very Difficult situation, now some enlargement of atrial mass while anticoagulation was held, unclear if is secondary to infection or clot due to period of time off anticoagulation. Patient is very frail and still with mental status changes, very poor operative candidate. Will discuss with cardiology but agree with anticoagulation and long term antibiotic therapy.  Delight Ovens MD  Beeper [phone] Office [phone]  09/25/2011 6:52 PM   .     Delight Ovens MD  Beeper [phone] Office [phone] 09/25/2011 6:46 PM

## 2011-09-25 NOTE — Progress Notes (Signed)
Subjective:  Awake and alert  Objective:  Vital Signs in the last 24 hours: Temp:  [97.3 F (36.3 C)-98.7 F (37.1 C)] 98.4 F (36.9 C) (01/04 0544) Pulse Rate:  [56-78] 62  (01/04 0544) Resp:  [20] 20  (01/04 0544) BP: (133-167)/(69-78) 167/69 mmHg (01/04 0544) SpO2:  [98 %-99 %] 99 % (01/04 0544) Weight:  [52.617 kg (116 lb)] 116 lb (52.617 kg) (01/04 0315)  Intake/Output from previous day:  Intake/Output Summary (Last 24 hours) at 09/25/11 0910 Last data filed at 09/25/11 4132  Gross per 24 hour  Intake  937.5 ml  Output    951 ml  Net  -13.5 ml    Physical Exam: General appearance: alert, cooperative and no distress Lungs: clear to auscultation bilaterally Heart: irregularly irregular rhythm and positive valve sounds   Rate: 60  Rhythm: atrial fibrillation and pacing on demand  Lab Results:  Basename 09/25/11 0736 09/24/11 0635  WBC 12.4* 11.3*  HGB 11.2* 10.9*  PLT 292 254    Basename 09/24/11 0635 09/23/11 0615  NA 138 134*  K 3.9 3.6  CL 103 103  CO2 24 22  GLUCOSE 114* 108*  BUN 11 11  CREATININE 0.66 0.58   No results found for this basename: TROPONINI:2,CK,MB:2 in the last 72 hours Hepatic Function Panel  Basename 09/24/11 0635  PROT 8.4*  ALBUMIN 2.4*  AST 209*  ALT 128*  ALKPHOS 129*  BILITOT 0.3  BILIDIR --  IBILI --   No results found for this basename: CHOL in the last 72 hours  Basename 09/25/11 0736  INR 2.56*    Imaging: No results found.  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 Agree with assessment and plan. Mild SOB. No chest pain.  Valve clicks are still crisp, 2/6 Systolic murmur. Vegetation on MVR slightly larger on TEE.  On Rocephin for planned 6 wk course of antibiotics. May need re-MVR if does not resolve. INR therapeutic at 2.56 today.   Lennette Bihari, MD, [facility] 09/25/2011 10:23 AM

## 2011-09-25 NOTE — Progress Notes (Signed)
Clinical Social Worker spoke with pt and pt family. Pt and pt family chooses bed at [facility]. Clinical Social Worker notified [facility] of pt acceptance of bed offer. Clinical Social Worker to facilitate pt discharge needs when pt medically ready for discharge.  Jacklynn Lewis, MSW, LCSWA  Clinical Social Work [phone]

## 2011-09-26 LAB — CBC
Hemoglobin: 11.7 g/dL — ABNORMAL LOW (ref 12.0–15.0)
MCH: 29.3 pg (ref 26.0–34.0)
MCHC: 33.4 g/dL (ref 30.0–36.0)
MCV: 87.7 fL (ref 78.0–100.0)
Platelets: 322 10*3/uL (ref 150–400)

## 2011-09-26 LAB — HEPARIN LEVEL (UNFRACTIONATED): Heparin Unfractionated: <0.1 IU/mL — ABNORMAL LOW (ref 0.30–0.70)

## 2011-09-26 MED ORDER — WARFARIN SODIUM 7.5 MG PO TABS
7.5000 mg | ORAL_TABLET | Freq: Once | ORAL | Status: AC
  Administered 2011-09-26: 7.5 mg via ORAL
  Filled 2011-09-26 (×2): qty 1

## 2011-09-26 NOTE — Progress Notes (Signed)
Pt had 2, 5 beat runs of V tach. Pt asymptomatic, eating dinner. BP 153/84, P 73. MD notified. Peter Congo RN

## 2011-09-26 NOTE — Progress Notes (Signed)
Subjective: Pt currently states that she feels well.  No acute issues.  No other complaints  Objective: Filed Vitals:   09/26/11 0408 09/26/11 1009 09/26/11 1418 09/26/11 1815  BP: 164/102 142/77 148/68 153/84  Pulse: 76 81 67 73  Temp: 98 F (36.7 C)  98.5 F (36.9 C)   TempSrc: Oral  Oral   Resp: 20  20   Height:      Weight: 50.5 kg (111 lb 5.3 oz)     SpO2: 96%  97%    Weight change: -2.117 kg (-4 lb 10.7 oz)  Intake/Output Summary (Last 24 hours) at 09/26/11 1906 Last data filed at 09/26/11 1840  Gross per 24 hour  Intake 1073.33 ml  Output      4 ml  Net 1069.33 ml    General: Alert, awake, oriented x3, in no acute distress.  HEENT: No bruits, no goiter.  Heart: Irregularly irregular rate controlled, without murmurs, rubs, gallops.  Lungs: Crackles left side, bilateral air movement.  Abdomen: Soft, nontender, nondistended, positive bowel sounds.  Neuro: Grossly intact, nonfocal.   Lab Results:  [facility] 09/24/11 0635  NA 138  K 3.9  CL 103  CO2 24  GLUCOSE 114*  BUN 11  CREATININE 0.66  CALCIUM 9.7  MG --  PHOS --    Basename 09/24/11 0635  AST 209*  ALT 128*  ALKPHOS 129*  BILITOT 0.3  PROT 8.4*  ALBUMIN 2.4*   No results found for this basename: LIPASE:2,AMYLASE:2 in the last 72 hours  Basename 09/26/11 0530 09/25/11 0736  WBC 12.1* 12.4*  NEUTROABS -- --  HGB 11.7* 11.2*  HCT 35.0* 33.4*  MCV 87.7 86.5  PLT 322 292   No results found for this basename: CKTOTAL:3,CKMB:3,CKMBINDEX:3,TROPONINI:3 in the last 72 hours No components found with this basename: POCBNP:3 No results found for this basename: DDIMER:2 in the last 72 hours No results found for this basename: HGBA1C:2 in the last 72 hours No results found for this basename: CHOL:2,HDL:2,LDLCALC:2,TRIG:2,CHOLHDL:2,LDLDIRECT:2 in the last 72 hours No results found for this basename: TSH,T4TOTAL,FREET3,T3FREE,THYROIDAB in the last 72 hours No results found for this basename:  VITAMINB12:2,FOLATE:2,FERRITIN:2,TIBC:2,IRON:2,RETICCTPCT:2 in the last 72 hours  Micro Results: No results found for this or any previous visit (from the past 240 hour(s)).  Studies/Results: No results found.  Medications: I have reviewed the patient's current medications.   Assessment/Plan:  1. Pneumococcal bacteremia and prosthetic valve endocarditis. Continue Rocephin. Per cardiology, the vegetation is larger but not yet interfering with disc motion. Cardiology does recommend redo mitral valve replacement. She is on IV heparin bridging and Coumadin per pharmacy. INR is almost at goal. Surveillance blood cultures are negative. 2. Small subarachnoid hemorrhage, posttraumatic versus septic emboli. Resolved neurology on board and patient on anticoagulation which she is tolerating. 3. Encephalopathy on admission:? Secondary to seizures. On by mouth Vimpat 4. Atrial fibrillation: Controlled ventricular rate and anticoagulated. Due to mechanical heart valve patient will require INR to be 2.5-3.5 range 5. Hypertension: will continue to monitor if persistantly elevated will plan on increasing antihypertensive medication. 6. Status post permanent cardiac pacemaker 7. Anemia: Stable  Disposition:  Will have to discuss with ID for recommended abx regimen as outpatient given pt is not a surgical candidate.  Will order a PT eval today.     LOS: 20 days   Penny Pia M.D.  [facility] 09/26/2011, 7:06 PM

## 2011-09-26 NOTE — Progress Notes (Signed)
ANTICOAGULATION CONSULT NOTE - Follow Up Consult  Pharmacy Consult for Coumadin Indication: MVR  Allergies  Allergen Reactions  . Codeine Nausea And Vomiting    Patient Measurements: Height: 5\' 1"  (154.9 cm) Weight: 111 lb 5.3 oz (50.5 kg) IBW/kg (Calculated) : 47.8  Adjusted Body Weight:   Vital Signs: Temp: 98.5 F (36.9 C) (01/05 1418) Temp src: Oral (01/05 1418) BP: 148/68 mmHg (01/05 1418) Pulse Rate: 67  (01/05 1418)  Labs:  Basename 09/26/11 0530 09/25/11 0736 09/24/11 1438 09/24/11 0635  HGB 11.7* 11.2* -- --  HCT 35.0* 33.4* -- 32.7*  PLT 322 292 -- 254  APTT -- -- -- --  LABPROT 26.6* 27.9* -- 26.9*  INR 2.41* 2.56* -- 2.44*  HEPARINUNFRC <0.10* 0.48 0.73* --  CREATININE -- -- -- 0.66  CKTOTAL -- -- -- --  CKMB -- -- -- --  TROPONINI -- -- -- --   Estimated Creatinine Clearance: 50.1 ml/min (by C-G formula based on Cr of 0.66).   Medications:  Scheduled:    . cefTRIAXone (ROCEPHIN)  IV  2 g Intravenous Q12H  . digoxin  0.125 mg Oral Daily  . famotidine  20 mg Oral BID  . lacosamide  100 mg Oral BID  . metoprolol tartrate  25 mg Oral BID  . warfarin  5 mg Oral ONCE-1800    Assessment: 71 y/o female patient on chronic coumadin for h/o MVR. INR subtherapeutic, no bleeding reported. Will increase dose.   Goal of Therapy:  INR=2.5-3.5   Plan:  Coumadin 7.5mg  today and f/u in am.  Verlene Mayer, PharmD, BCPS Pager [phone] 09/26/2011,5:26 PM

## 2011-09-26 NOTE — Progress Notes (Signed)
[facility]  DAILY PROGRESS NOTE   Subjective:  Confused, but improved somewhat. Not felt to be an operative candidate per TCTS. Currently on antibiotics for mitral valve vegetation/thrombus.  Objective:  Temp:  [97.7 F (36.5 C)-98.2 F (36.8 C)] 98 F (36.7 C) (01/05 0408) Pulse Rate:  [68-76] 76  (01/05 0408) Resp:  [18-20] 20  (01/05 0408) BP: (144-169)/(67-102) 164/102 mmHg (01/05 0408) SpO2:  [96 %-98 %] 96 % (01/05 0408) Weight:  [50.5 kg (111 lb 5.3 oz)] 111 lb 5.3 oz (50.5 kg) (01/05 0408) Weight change: -2.117 kg (-4 lb 10.7 oz)  Intake/Output from previous day: 01/04 0701 - 01/05 0700 In: 820 [P.O.:480; I.V.:240; IV Piggyback:100] Out: 2 [Urine:2]  Intake/Output from this shift:    Medications: Current Facility-Administered Medications  Medication Dose Route Frequency Provider Last Rate Last Dose  . 0.9 %  sodium chloride infusion   Intravenous Continuous Billy Fischer, MD 20 mL/hr at 09/22/11 [phone]    . 0.9 %  sodium chloride infusion  250 mL Intravenous PRN Kalman Shan, MD   250 mL at 09/23/11 0133  . acetaminophen (TYLENOL) tablet 650 mg  650 mg Oral Q6H PRN Cliffton Asters, MD   650 mg at 09/24/11 1248  . cefTRIAXone (ROCEPHIN) 2 g in dextrose 5 % 50 mL IVPB  2 g Intravenous Q12H Cliffton Asters, MD   2 g at 09/26/11 0301  . digoxin (LANOXIN) tablet 0.125 mg  0.125 mg Oral Daily Eda Paschal [place], PA   0.125 mg at 09/25/11 0912  . diphenhydrAMINE (BENADRYL) injection 12.5 mg  12.5 mg Intravenous Q6H PRN Billy Fischer, MD       Or  . diphenhydrAMINE (BENADRYL) 12.5 MG/5ML elixir 12.5 mg  12.5 mg Oral Q6H PRN Billy Fischer, MD      . famotidine (PEPCID) tablet 20 mg  20 mg Oral BID Kalman Shan, MD   20 mg at 09/25/11 2233  . lacosamide (VIMPAT) tablet 100 mg  100 mg Oral BID Annie Main, NP   100 mg at 09/25/11 2232  . metoprolol tartrate (LOPRESSOR) tablet 25 mg  25 mg Oral BID Billy Fischer, MD   25 mg at 09/25/11 2233  . naloxone  [facility]) injection 0.4 mg  0.4 mg Intravenous PRN Billy Fischer, MD       And  . sodium chloride 0.9 % injection 9 mL  9 mL Intravenous PRN Billy Fischer, MD      . ondansetron [facility]) injection 4 mg  4 mg Intravenous Q6H PRN Billy Fischer, MD   4 mg at 09/20/11 1512  . polyethylene glycol (MIRALAX / GLYCOLAX) packet 17 g  17 g Oral Daily PRN Billy Fischer, MD   17 g at 09/14/11 1754  . warfarin (COUMADIN) tablet 5 mg  5 mg Oral ONCE-1800 Samella Parr, PHARMD   5 mg at 09/25/11 1191  . DISCONTD: heparin ADULT infusion 100 units/ml (25000 units/250 ml)  1,500 Units/hr Intravenous Continuous Maryanna Shape [place], [place]   15 mL/hr at 09/25/11 4782    Physical Exam: General appearance: alert and somewhat confused Neck: no adenopathy, no carotid bruit, no JVD, supple, symmetrical, trachea midline and thyroid not enlarged, symmetric, no tenderness/mass/nodules Lungs: clear to auscultation bilaterally Heart: regular rate and rhythm and crisp mechanical valve sounds Extremities: extremities normal, atraumatic, no cyanosis or edema  Lab Results: Results for orders placed during the hospital encounter of 09/06/11 (from the past 48 hour(s))  HEPARIN LEVEL (UNFRACTIONATED)  Status: Abnormal   Collection Time   09/24/11  2:38 PM      Component Value Range Comment   Heparin Unfractionated 0.73 (*) 0.30 - 0.70 (IU/mL)   CBC     Status: Abnormal   Collection Time   09/25/11  7:36 AM      Component Value Range Comment   WBC 12.4 (*) 4.0 - 10.5 (K/uL)    RBC 3.86 (*) 3.87 - 5.11 (MIL/uL)    Hemoglobin 11.2 (*) 12.0 - 15.0 (g/dL)    HCT 04.5 (*) 40.9 - 46.0 (%)    MCV 86.5  78.0 - 100.0 (fL)    MCH 29.0  26.0 - 34.0 (pg)    MCHC 33.5  30.0 - 36.0 (g/dL)    RDW 81.1  91.4 - 78.2 (%)    Platelets 292  150 - 400 (K/uL)   HEPARIN LEVEL (UNFRACTIONATED)     Status: Normal   Collection Time   09/25/11  7:36 AM      Component Value Range Comment   Heparin Unfractionated 0.48  0.30 - 0.70 (IU/mL)     PROTIME-INR     Status: Abnormal   Collection Time   09/25/11  7:36 AM      Component Value Range Comment   Prothrombin Time 27.9 (*) 11.6 - 15.2 (seconds)    INR 2.56 (*) 0.00 - 1.49    CBC     Status: Abnormal   Collection Time   09/26/11  5:30 AM      Component Value Range Comment   WBC 12.1 (*) 4.0 - 10.5 (K/uL)    RBC 3.99  3.87 - 5.11 (MIL/uL)    Hemoglobin 11.7 (*) 12.0 - 15.0 (g/dL)    HCT 95.6 (*) 21.3 - 46.0 (%)    MCV 87.7  78.0 - 100.0 (fL)    MCH 29.3  26.0 - 34.0 (pg)    MCHC 33.4  30.0 - 36.0 (g/dL)    RDW 08.6  57.8 - 46.9 (%)    Platelets 322  150 - 400 (K/uL)   HEPARIN LEVEL (UNFRACTIONATED)     Status: Abnormal   Collection Time   09/26/11  5:30 AM      Component Value Range Comment   Heparin Unfractionated <0.10 (*) 0.30 - 0.70 (IU/mL)   PROTIME-INR     Status: Abnormal   Collection Time   09/26/11  5:30 AM      Component Value Range Comment   Prothrombin Time 26.6 (*) 11.6 - 15.2 (seconds)    INR 2.41 (*) 0.00 - 1.49      Imaging: No results found.  Assessment:  1. Principal Problem: 2.  *Encephalopathy acute 3. Active Problems: 4.  Atrial fibrillation with rapid ventricular response 5.  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  Pneumococcal septicemia 7.  Subarachnoid hemorrhage, embolic secondary to endocaditis 8.  Presence of permanent cardiac pacemaker 9.  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   Plan:  1. Mechanical valve endocarditis with vegetation. Mass appears to have grown during anticoagulation .Marland Kitchen Honeymoon. She is now therapeutic on warfarin. She continues on antibiotics and given the fact she is a poor re-do valve candidate (which I agree with), she will most likely need lifelong suppressive antibiotics after finishing her initial course. Would be helpful to ask ID for recommendations regarding suppressive antibiotics. Would work toward disposition,  likely in a SNF facility.  Follow-up with Dr. Clarene Duke in our office as an outpatient.  Will sign-off, call with questions.  Time Spent Directly with Patient:  15 minutes  Length of Stay:  LOS: 20 days   Chrystie Nose, MD Attending Cardiologist [facility]  Hershey Knauer C 09/26/2011, 8:06 AM

## 2011-09-27 LAB — CBC
HCT: 32 % — ABNORMAL LOW (ref 36.0–46.0)
MCH: 29.3 pg (ref 26.0–34.0)
MCV: 87 fL (ref 78.0–100.0)
Platelets: 279 10*3/uL (ref 150–400)
RBC: 3.68 MIL/uL — ABNORMAL LOW (ref 3.87–5.11)

## 2011-09-27 MED ORDER — DEXTROSE 5 % IV SOLN
2.0000 g | INTRAVENOUS | Status: DC
  Administered 2011-09-28 – 2011-09-30 (×3): 2 g via INTRAVENOUS
  Filled 2011-09-27 (×3): qty 2

## 2011-09-27 MED ORDER — WARFARIN SODIUM 7.5 MG PO TABS
7.5000 mg | ORAL_TABLET | Freq: Once | ORAL | Status: AC
  Administered 2011-09-27: 7.5 mg via ORAL
  Filled 2011-09-27: qty 1

## 2011-09-27 NOTE — Progress Notes (Signed)
ANTICOAGULATION CONSULT NOTE - Follow Up Consult  Pharmacy Consult for Coumadin Indication: MVR  Allergies  Allergen Reactions  . Codeine Nausea And Vomiting    Patient Measurements: Height: 5\' 1"  (154.9 cm) Weight: 111 lb 5.3 oz (50.5 kg) IBW/kg (Calculated) : 47.8  Adjusted Body Weight:   Vital Signs: Temp: 98.3 F (36.8 C) (01/06 1538) BP: 150/77 mmHg (01/06 1538) Pulse Rate: 83  (01/06 1538)  Labs:  Basename 09/27/11 0741 09/26/11 0530 09/25/11 0736  HGB 10.8* 11.7* --  HCT 32.0* 35.0* 33.4*  PLT 279 322 292  APTT -- -- --  LABPROT 30.1* 26.6* 27.9*  INR 2.82* 2.41* 2.56*  HEPARINUNFRC -- <0.10* 0.48  CREATININE -- -- --  CKTOTAL -- -- --  CKMB -- -- --  TROPONINI -- -- --   Estimated Creatinine Clearance: 50.1 ml/min (by C-G formula based on Cr of 0.66).   Medications:  Scheduled:     . cefTRIAXone (ROCEPHIN)  IV  2 g Intravenous Q12H  . digoxin  0.125 mg Oral Daily  . famotidine  20 mg Oral BID  . lacosamide  100 mg Oral BID  . metoprolol tartrate  25 mg Oral BID  . warfarin  7.5 mg Oral ONCE-1800    Assessment: 71 yo female patient on chronic coumadin for h/o MVR. INR is at-goal.    Goal of Therapy:  INR=2.5-3.5   Plan:  1. Coumadin 7.5mg  po today.  2. Follow-up AM INR.  Lorre Munroe, PharmD 09/27/2011,3:48 PM

## 2011-09-27 NOTE — Discharge Summary (Signed)
Admit date: 09/06/2011 Discharge date: 09/28/2011  Primary Care Physician:  Geraldo Pitter, MD, MD   Discharge Diagnoses:   Active Hospital Problems  Diagnoses Date Noted   . Encephalopathy acute 09/06/2011   . 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 Chronic anticoagulation, ( INR goal 2.5-3.5 under normal circumstances for [facility] Jude MVR) 09/09/2011   . 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 Presence of permanent cardiac pacemaker 09/08/2011   . Atrial fibrillation with rapid ventricular response 09/06/2011   . S/P mitral valve replacement, [facility] Jude 09/06/2011   . Pneumococcal septicemia 09/06/2011     Resolved Hospital Problems  Diagnoses Date Noted Date Resolved  . Sepsis 09/06/2011 09/08/2011  . Flu-like symptoms 09/06/2011 09/09/2011  . UTI (lower urinary tract infection) 09/06/2011 09/09/2011     DISCHARGE MEDICATION: Current Discharge Medication List    CONTINUE these medications which have NOT CHANGED   Details  digoxin (LANOXIN) 0.25 MG tablet Take 250 mcg by mouth daily.      diltiazem (CARDIZEM LA) 300 MG 24 hr tablet Take 360 mg by mouth daily.      famotidine (PEPCID) 20 MG tablet Take 20 mg by mouth 2 (two) times daily.      losartan (COZAAR) 50 MG tablet Take 50 mg by mouth daily.      metoprolol tartrate (LOPRESSOR) 25 MG tablet Take 25 mg by mouth 2 (two) times daily.      warfarin (COUMADIN) 5 MG tablet Take 5 mg by mouth daily.            Consults:     SIGNIFICANT DIAGNOSTIC STUDIES:  Ct Angio Head W/cm &/or Wo Cm  09/08/2011  *RADIOLOGY REPORT*  Clinical Data:  2-week history of fever and malaise.  Possible right common carotid artery dissections seen on CT cervical spine.  CT ANGIOGRAPHY HEAD AND NECK  Technique:  Multidetector CT imaging of the head and neck was performed using the standard protocol during bolus administration of intravenous contrast.  Multiplanar CT image reconstructions including MIPs  were obtained to evaluate the vascular anatomy. Carotid stenosis measurements (when applicable) are obtained utilizing NASCET criteria, using the distal internal carotid diameter as the denominator.  Contrast: OMNIPAQUE IOHEXOL 350 MG/ML IV SOLN an  Comparison:  CT head and C-spine earlier today.  CTA NECK  Findings:  There is reflux of contrast into the left internal jugular vein.  This may be related to the left sided pacemaker placement.  There is fair opacification of the craniocerebral vasculature.  Atheromatous change can be seen in the transverse arch.  There is considerable Hounsfield artifact from the pacemaker battery pack. There is no visible proximal great vessel dissection or stenosis.  Both carotid bifurcations are widely patent.  Mild nonstenotic calcific atheromatous change can be seen on the right.  Cervical internal carotid arteries tortuous but patent without dissection. Both vertebrals are patent and equal in size through the neck. Possible 15 x 16 mm right thyroid nodule.  Consider thyroid ultrasound for further evaluation.  Lung apices clear.   Review of the MIP images confirms the above findings.  IMPRESSION: No visible proximal great vessel dissection within limits of detection by artifact from pacemaker battery pack.  No evidence for flow reducing lesion in the right or left carotid system.  CTA HEAD  Findings:  Calcific nonstenotic atheromatous change of the skull base, cavernous, and supraclinoid internal carotid arteries.  No visible proximal stenosis of the anterior, middle, or posterior cerebral  arteries.  Basilar artery patent without focal stenosis. No visible intracranial aneurysm.  Post infusion imaging of the head reveals no abnormal enhancement.  Chronic left basal ganglia infarct redemonstrated.  Left frontal subarachnoid blood appears slightly greater than was seen earlier. It is possible a component of this increased conspicuity relates to enhancement postcontrast.  Recommend repeat noncontrast examination for continued surveillance.   Review of the MIP images confirms the above findings.  IMPRESSION: No proximal carotid or basilar stenosis.  No visible intracranial dissection.  Suspect slight increase in left frontal subarachnoid blood. I cannot completely exclude that this increased prominence relates to a component of enhancement Recommend repeat noncontrast imaging in 24-48 hours.  Original Report Authenticated By: Elsie Stain, M.D.   Dg Chest 2 View  09/06/2011  *RADIOLOGY REPORT*  Clinical Data: Rule out infiltrate. Confusion.  CHEST - 2 VIEW  Comparison: 02/20/2008  Findings:  Heart size appears mildly enlarged.  Prior median sternotomy CABG procedure.  Left chest wall pacer device is noted with lead in the right ventricle.  There are no pleural effusions or interstitial edema.  Chronic bronchitic changes are noted bilaterally.  IMPRESSION:  1.  Cardiac enlargement. 2.  No acute changes.  Original Report Authenticated By: Rosealee Albee, M.D.   Ct Head Wo Contrast  09/16/2011  *RADIOLOGY REPORT*  Clinical Data: Follow up subarachnoid hemorrhage  CT HEAD WITHOUT CONTRAST  Technique:  Contiguous axial images were obtained from the base of the skull through the vertex without contrast.  Comparison: 09/13/2011  Findings: Near complete resolution of prior left frontal subarachnoid hemorrhage.  No parenchymal hemorrhage or new extra- axial hemorrhage is seen.  No mass lesion, mass effect, or midline shift.  Subcortical white matter and periventricular small vessel ischemic changes.  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  Mild global cortical atrophy.  No ventriculomegaly.  The visualized paranasal sinuses are essentially clear. The mastoid air cells are unopacified.  No evidence of calvarial fracture.  IMPRESSION: No evidence of acute intracranial abnormality.  Near complete resolution of prior left frontal subarachnoid hemorrhage.  Original Report Authenticated  By: Charline Bills, M.D.   Ct Head Wo Contrast  09/13/2011  *RADIOLOGY REPORT*  Clinical Data: New onset headache and neck soreness.  CT HEAD WITHOUT CONTRAST  Technique:  Contiguous axial images were obtained from the base of the skull through the vertex without contrast.  Comparison: Head CT scans 09/10/2011 and 09/09/2011.  Findings: Small amount of subarachnoid hemorrhage in the left frontal lobe continues to resolve.  No new hemorrhage is identified.  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  No midline shift, hydrocephalus or abnormal extra-axial fluid collection is identified.  IMPRESSION:  1.  No acute finding. 2.  Resolving subarachnoid hemorrhage on the left.  Original Report Authenticated By: Bernadene Bell. Maricela Curet, M.D.   Ct Head Wo Contrast  09/10/2011  *RADIOLOGY REPORT*  Clinical Data: Follow up subarachnoid hemorrhage  CT HEAD WITHOUT CONTRAST  Technique:  Contiguous axial images were obtained from the base of the skull through the vertex without contrast.  Comparison: 09/09/2011  Findings: Hyperdense subarachnoid hemorrhage over the convexity left frontal region has improved.  No new areas of hemorrhage are identified.  No subdural hemorrhage is present.  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  No acute infarct.  Negative for mass lesion.  IMPRESSION: Small areas of subarachnoid hemorrhage over the convexity on the left have improved.  No new area of hemorrhage or infarction.  Original Report  Authenticated By: Camelia Phenes, M.D.   Ct Head Wo Contrast  09/09/2011  *RADIOLOGY REPORT*  Clinical Data: Followup subarachnoid hemorrhage.  CT HEAD WITHOUT CONTRAST  Technique:  Contiguous axial images were obtained from the base of the skull through the vertex without contrast.  Comparison: 09/07/2011 CT and 11/10/2005 MR.  Findings: Compared to the 09/07/2011 noncontrast CT examination. There has been an increase in amount of subarachnoid blood in the left  frontal region.  Etiology of this is indeterminate.  Result of venous thrombosis / infarct not excluded.  Nonspecific white matter type changes most notable left frontal region.  Remote left caudate head infarct with encephalomalacia. This is new compared to 2007 MR. No CT evidence of large acute infarct.  Small acute infarct cannot be excluded by CT.  No intracranial mass lesion detected on this unenhanced exam.  IMPRESSION: Compared to the 09/07/2011 noncontrast CT examination.  There has been an increase in amount of subarachnoid blood in the left frontal region.  Etiology of this is indeterminate. Please see above.  Original Report Authenticated By: Fuller Canada, M.D.   Ct Head Wo Contrast  09/07/2011  *RADIOLOGY REPORT*  Clinical Data: Headaches  CT HEAD WITHOUT CONTRAST  Technique:  Contiguous axial images were obtained from the base of the skull through the vertex without contrast.  Comparison: [place] MRI brain dated 11/10/2005  Findings: Tiny hyperdense foci overlying the left frontal lobe (series 2/images 14 and 22) and left medial frontal lobe (series 2/image 19), possibly reflecting subarachnoid hemorrhage.  No mass lesion, mass effect, or midline shift.  No CT evidence of acute infarction.  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  Subcortical white matter and periventricular small vessel ischemic changes.  Mildly age related atrophy.  No ventriculomegaly.  The visualized paranasal sinuses are essentially clear. The mastoid air cells are unopacified.  No evidence of calvarial fracture.  IMPRESSION: Possible tiny foci of subarachnoid hemorrhage overlying the left frontal lobe, as described above.  Old lacunar infarct with small vessel ischemic changes  Critical Value/emergent results were called by telephone at the time of interpretation on 09/07/2011  at 1810 hours  to  Dr Delford Field, who verbally acknowledged these results.  Original Report Authenticated By: Charline Bills, M.D.   Ct Angio  Neck W/cm &/or Wo/cm  09/08/2011  *RADIOLOGY REPORT*  Clinical Data:  2-week history of fever and malaise.  Possible right common carotid artery dissections seen on CT cervical spine.  CT ANGIOGRAPHY HEAD AND NECK  Technique:  Multidetector CT imaging of the head and neck was performed using the standard protocol during bolus administration of intravenous contrast.  Multiplanar CT image reconstructions including MIPs were obtained to evaluate the vascular anatomy. Carotid stenosis measurements (when applicable) are obtained utilizing NASCET criteria, using the distal internal carotid diameter as the denominator.  Contrast: OMNIPAQUE IOHEXOL 350 MG/ML IV SOLN an  Comparison:  CT head and C-spine earlier today.  CTA NECK  Findings:  There is reflux of contrast into the left internal jugular vein.  This may be related to the left sided pacemaker placement.  There is fair opacification of the craniocerebral vasculature.  Atheromatous change can be seen in the transverse arch.  There is considerable Hounsfield artifact from the pacemaker battery pack. There is no visible proximal great vessel dissection or stenosis.  Both carotid bifurcations are widely patent.  Mild nonstenotic calcific atheromatous change can be seen on the right.  Cervical internal carotid arteries tortuous but patent without dissection.  Both vertebrals are patent and equal in size through the neck. Possible 15 x 16 mm right thyroid nodule.  Consider thyroid ultrasound for further evaluation.  Lung apices clear.   Review of the MIP images confirms the above findings.  IMPRESSION: No visible proximal great vessel dissection within limits of detection by artifact from pacemaker battery pack.  No evidence for flow reducing lesion in the right or left carotid system.  CTA HEAD  Findings:  Calcific nonstenotic atheromatous change of the skull base, cavernous, and supraclinoid internal carotid arteries.  No visible proximal stenosis of the anterior,  middle, or posterior cerebral arteries.  Basilar artery patent without focal stenosis. No visible intracranial aneurysm.  Post infusion imaging of the head reveals no abnormal enhancement.  Chronic left basal ganglia infarct redemonstrated.  Left frontal subarachnoid blood appears slightly greater than was seen earlier. It is possible a component of this increased conspicuity relates to enhancement postcontrast. Recommend repeat noncontrast examination for continued surveillance.   Review of the MIP images confirms the above findings.  IMPRESSION: No proximal carotid or basilar stenosis.  No visible intracranial dissection.  Suspect slight increase in left frontal subarachnoid blood. I cannot completely exclude that this increased prominence relates to a component of enhancement Recommend repeat noncontrast imaging in 24-48 hours.  Original Report Authenticated By: Elsie Stain, M.D.   Ct Cervical Spine W Contrast  09/07/2011  *RADIOLOGY REPORT*  Clinical Data: C-spine tenderness, evaluate for fracture or abscess  CT CERVICAL SPINE WITH CONTRAST  Technique:  Multidetector CT imaging of the cervical spine was performed during intravenous contrast administration. Multiplanar CT image reconstructions were also generated.  Contrast: OMNIPAQUE IOHEXOL 300 MG/ML IV SOLN  Comparison: None.  Findings: Normal cervical lordosis.  No evidence of fracture or dislocation.  Vertebral body heights are maintained.  The dens appears intact.  No prevertebral soft tissue swelling.  Mild multilevel degenerative changes.  No epidural abscess/fluid collection is seen.  There is a possible linear filling defect within the right common carotid artery (series 606/image 69, series 605/image 4), and although this may reflect artifact at the thoracic inlet, a focal dissection cannot be excluded.  IMPRESSION: Apparent linear filling defect within the right common carotid artery, possibly artifactual, although focal dissection cannot  be excluded.  Consider CTA/MRA neck for further characterization as clinically warranted.  No evidence of fracture or dislocation.  No epidural abscess/fluid collection is seen.  Critical Value/emergent results were called by telephone at the time of interpretation on 09/07/2011  at 1810 hours  to  Dr Delford Field, who verbally acknowledged these results.  Original Report Authenticated By: Charline Bills, M.D.   Ct Thoracic Spine Wo Contrast  09/10/2011  *RADIOLOGY REPORT*  Clinical Data:  Leg weakness  CT THORACIC AND LUMBAR SPINE WITHOUT CONTRAST  Technique:  Multidetector CT imaging of the thoracic and lumbar spine was performed without contrast. Multiplanar CT image reconstructions were also generated.  Comparison:  None  CT THORACIC SPINE  Findings:  No fracture or dislocation is seen.  Mild multilevel degenerative changes of the thoracic spine, most prominent at T11-12.  Lower cervical spine is incompletely imaged.  Left paracentral disc herniation at C5-6 (series 9/image 14).  Visualized thyroid is unremarkable.  Cardiomegaly.  Coronary atherosclerosis.  Valve annuloplasty.  Atherosclerotic calcifications of the aortic arch.  Pacemaker leads.  Evaluation of the lung parenchyma is constrained by respiratory motion.  Scattered patchy left upper lobe opacities. Mild right middle lobe opacity / scarring.  Small bilateral  pleural effusions with associated lower lobe atelectasis.  IMPRESSION: No fracture or dislocation is seen.  Mild multilevel degenerative changes, most prominent at T11-12.  Left paracentral disc herniation at C5-6.  Evaluation of the lung parenchyma is constrained by respiratory motion.  Patchy left upper lobe and right middle lobe opacities. Small bilateral pleural effusions with associated lower lobe atelectasis.  CT LUMBAR SPINE  Findings: No fracture or dislocation is seen.  Very mild multilevel degenerative changes.  Atherosclerotic calcifications of the abdominal aorta and branch vessels.   Suspected calcified uterine fibroids.  IMPRESSION: No fracture or dislocation is seen.  Very mild multilevel degenerative changes.  Original Report Authenticated By: Charline Bills, M.D.   Ct Lumbar Spine Wo Contrast  09/10/2011  *RADIOLOGY REPORT*  Clinical Data:  Leg weakness  CT THORACIC AND LUMBAR SPINE WITHOUT CONTRAST  Technique:  Multidetector CT imaging of the thoracic and lumbar spine was performed without contrast. Multiplanar CT image reconstructions were also generated.  Comparison:  None  CT THORACIC SPINE  Findings:  No fracture or dislocation is seen.  Mild multilevel degenerative changes of the thoracic spine, most prominent at T11-12.  Lower cervical spine is incompletely imaged.  Left paracentral disc herniation at C5-6 (series 9/image 14).  Visualized thyroid is unremarkable.  Cardiomegaly.  Coronary atherosclerosis.  Valve annuloplasty.  Atherosclerotic calcifications of the aortic arch.  Pacemaker leads.  Evaluation of the lung parenchyma is constrained by respiratory motion.  Scattered patchy left upper lobe opacities. Mild right middle lobe opacity / scarring.  Small bilateral pleural effusions with associated lower lobe atelectasis.  IMPRESSION: No fracture or dislocation is seen.  Mild multilevel degenerative changes, most prominent at T11-12.  Left paracentral disc herniation at C5-6.  Evaluation of the lung parenchyma is constrained by respiratory motion.  Patchy left upper lobe and right middle lobe opacities. Small bilateral pleural effusions with associated lower lobe atelectasis.  CT LUMBAR SPINE  Findings: No fracture or dislocation is seen.  Very mild multilevel degenerative changes.  Atherosclerotic calcifications of the abdominal aorta and branch vessels.  Suspected calcified uterine fibroids.  IMPRESSION: No fracture or dislocation is seen.  Very mild multilevel degenerative changes.  Original Report Authenticated By: Charline Bills, M.D.   Dg Chest Port 1  View  09/17/2011  *RADIOLOGY REPORT*  Clinical Data: Respiratory failure, shortness of breath  PORTABLE CHEST - 1 VIEW  Comparison: 09/11/2011; 09/09/2011; 09/08/2011; chest CT - 09/10/2011  Findings:  Grossly unchanged enlarged cardiac silhouette and mediastinal contours post median sternotomy and valve replacement given decreased lung volumes.  Grossly unchanged positioning of single lead pacemaker with tip overlying the expected location of the right ventricle.  Decreased lung volumes with corresponding interval increase in right perihilar heterogeneous opacities. Cephalization of flow with thickening along the right minor fissure.  No definite pleural effusion or pneumothorax.  Unchanged bones.  IMPRESSION: 1.  Decreased lung volumes with interval increase in right perihilar opacities to a represent atelectasis. 2.  Query mild pulmonary edema.  Original Report Authenticated By: Waynard Reeds, M.D.   Dg Chest Port 1 View  09/11/2011  *RADIOLOGY REPORT*  Clinical Data: Respiratory failure  PORTABLE CHEST - 1 VIEW  Comparison: 09/09/2011  Findings: Heart is moderately enlarged.  Single lead left subclavian pacemaker device unchanged.  Low volumes and hazy airspace opacities improved.  Stable mid lung right-sided linear opacity.  Normal pulmonary vascularity.  IMPRESSION: Improved aeration and airspace opacities.  Original Report Authenticated By: Donavan Burnet, M.D.   Dg  Chest Port 1 View  09/09/2011  *RADIOLOGY REPORT*  Clinical Data: No edema, evaluate endotracheal tube position  PORTABLE CHEST - 1 VIEW  Comparison: Portable chest x-ray of 09/08/2011  Findings: No endotracheal tube is visible.  There is little change in poor aeration and basilar volume loss.  Cardiomegaly is stable. 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  IMPRESSION: No endotracheal tube.  No change in poor aeration with basilar volume loss.  Original Report Authenticated By: Juline Patch, M.D.   Dg Chest Port 1  View  09/08/2011  *RADIOLOGY REPORT*  Clinical Data: Respiratory distress.  PORTABLE CHEST - 1 VIEW  Comparison: 09/06/2011  Findings: Cardiomegaly.  Mild vascular congestion.  Interstitial prominence could reflect interstitial edema.  This is increased slightly since prior study.  No effusions.  No acute bony abnormality.  IMPRESSION: Increasing interstitial prominence, question interstitial edema.  Original Report Authenticated By: Cyndie Chime, M.D.     ECHO: TEE  Study Conclusions  - Left ventricle: Systolic function was vigorous. The estimated ejection fraction was in the range of 65% to 70%. Wall motion was normal; there were no regional wall motion abnormalities. - Aortic valve: No evidence of vegetation. - Mitral valve: There is a St. Jude mechanical mitral valve. There again appears to be a mobile thrombus/vegetation attached at the strut position in the area of the native anterior mitral leaflet. There is tilting bileaflet motion which appears normal. The mass measures 4 mm x 17 mm and is well visualized in 2D and 3D images. - Left atrium: There is spontaneous echo contrast (smoke) in the left atrium, suggesting a low flow state. The atrium was dilated. No evidence of thrombus in the appendage. No evidence of thrombus in the atrial cavity or appendage. The appendageis moderate sized with low velocity flow by pulse and color doppler. - Right ventricle: Pacer wire or catheter noted in right ventricle. - Right atrium: The atrium was dilated. Pacer wire or catheter noted in right atrium. - Atrial septum: No defect or patent foramen ovale was identified. - Tricuspid valve: Mild to moderate TR. Mildly thickened leaflets. Transesophageal echocardiography. 2D and color Doppler. Height: Height: 154.9cm. Height: 61in. Weight: Weight: 75.5kg. Weight: 166lb. Body mass index: BMI: 31.4kg/m^2. Body surface area: BSA: 1.48m^2. Blood pressure: 156/81. Patient status: Inpatient.  Location: Endoscopy.       CARDIAC CATH & OTHER PROCEDURES: Please see above  No results found for this or any previous visit (from the past 240 hour(s)).  BRIEF ADMITTING H & P: Pt is a AAF with h/o mechanical heart valve, afib, and seizures that initially presented to the ED with altered mental status.  Initially was found to be septic and was placed on IV antibiotics.  Given her mental status neurology was consulted.  Based on neuro's evaluation and initial clinical presentation it was suspected that patient may have had a septic emboli but also on CT scan there was a small subarachnoid hemorrhage which was suspected to be secondary to coumadin anticoagulation.  This was reversed per documentation.   Cardiology was consulted for TEE to further assess for vegetations.  Patient was found to have Pneumococcal septicemia and endocarditis of prosthetic MV.  Infectious disease was consulted and patient was placed on Rocephin of which she completed a > 14 course of high dose regimen.  She was switched to Rocephin 2 gm once a day the day before discharge and is to continue that for 4 weeks with a 2 week f/u  with infectious disease post discharge.  On 12/24 Neurology had seen patient and documented that repeat CT scan of the head showed no acute changes, with resolution of small hemorrhages.  And patient was neurologically stable at that time and remained so until discharge.  Patient will go to [facility]  Active Hospital Problems  Diagnoses Date Noted   . Encephalopathy acute 09/06/2011   . 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 Chronic anticoagulation, ( INR goal 2.5-3.5 under normal circumstances for ST Jude MVR) 09/09/2011   . 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 Presence of permanent cardiac pacemaker 09/08/2011   . Atrial fibrillation with rapid ventricular response 09/06/2011   . S/P mitral valve replacement, St Jude 09/06/2011   . Pneumococcal septicemia  09/06/2011     Resolved Hospital Problems  Diagnoses Date Noted Date Resolved  . Sepsis 09/06/2011 09/08/2011  . Flu-like symptoms 09/06/2011 09/09/2011  . UTI (lower urinary tract infection) 09/06/2011 09/09/2011     Disposition and Follow-up: Pt is to f/u with ID 2 weeks post discharge.  Is to maintain on rocephin 2 gm once a day for the next 4 weeks.  Also is to f/u with her cardiologist Discharge Orders    Future Appointments: Provider: Department: Dept Phone: Center:   11/10/2011 1:30 PM Gwenith Spitz [place] [facility] [phone] None   11/16/2011 1:30 PM Victorino December, MD [facility] [phone] None     Follow-up Information    Follow up with Gates Rigg, MD. Make an appointment in 2 months.   Contact information:   [address] [facility] [place] [address] [phone]       Follow up with Geraldo Pitter, MD.   Contact information:   [address] [place] [address] [phone]           DISCHARGE EXAM:  General: Alert, awake, oriented x3, in no acute distress. HEENT: atraumatic, EOMI, No bruits, no goiter. Heart:  Irregularly Irregular with crisp mechanical heart valves. Lungs: CTA BL Abdomen: Soft, nontender, nondistended, positive bowel sounds. Extremities: No clubbing cyanosis or edema with positive pedal pulses. Neuro: Pt responds to questions appropriately. Non focal.    Blood pressure 150/77, pulse 83, temperature 98.3 F (36.8 C), temperature source Oral, resp. rate 18, height 5\' 1"  (1.549 m), weight 50.5 kg (111 lb 5.3 oz), SpO2 99.00%.  No results found for this basename: NA:2,K:2,CL:2,CO2:2,GLUCOSE:2,BUN:2,CREATININE:2,CALCIUM:2,MG:2,PHOS:2 in the last 72 hours No results found for this basename: AST:2,ALT:2,ALKPHOS:2,BILITOT:2,PROT:2,ALBUMIN:2 in the last 72 hours No results found for this basename: LIPASE:2,AMYLASE:2 in the last 72 hours  Basename 09/27/11  0741 09/26/11 0530  WBC 11.9* 12.1*  NEUTROABS -- --  HGB 10.8* 11.7*  HCT 32.0* 35.0*  MCV 87.0 87.7  PLT 279 322    Signed: Penny Pia M.D. 09/27/2011, 6:03 PM

## 2011-09-27 NOTE — Progress Notes (Signed)
Subjective: No acute issue overnight.  No acute complaints currently.  Spoke with Child psychotherapist and we were trying to get patient into SNF today but because patient is new patient to the place they will not be able to get her in SNF today per social worker.   Objective: Filed Vitals:   09/26/11 2140 09/27/11 0540 09/27/11 1050 09/27/11 1538  BP: 154/66 154/76 137/75 150/77  Pulse: 97 69 88 83  Temp: 98.9 F (37.2 C) 97.3 F (36.3 C)  98.3 F (36.8 C)  TempSrc:      Resp: 20 20  18   Height:      Weight:      SpO2: 96% 98%  99%   Weight change:   Intake/Output Summary (Last 24 hours) at 09/27/11 1745 Last data filed at 09/27/11 1428  Gross per 24 hour  Intake    700 ml  Output      4 ml  Net    696 ml    General: Alert, awake, oriented x3, in no acute distress.  HEENT: No bruits, no goiter.  Heart:, Irregularly irregular rate controlled, Crisp mechanical heart valve sounds  Lungs: Clear to auscultation.  Abdomen: Soft, nontender, nondistended, positive bowel sounds.  Neuro: Grossly intact, nonfocal.   Lab Results: No results found for this basename: NA:2,K:2,CL:2,CO2:2,GLUCOSE:2,BUN:2,CREATININE:2,CALCIUM:2,MG:2,PHOS:2 in the last 72 hours No results found for this basename: AST:2,ALT:2,ALKPHOS:2,BILITOT:2,PROT:2,ALBUMIN:2 in the last 72 hours No results found for this basename: LIPASE:2,AMYLASE:2 in the last 72 hours  Basename 09/27/11 0741 09/26/11 0530  WBC 11.9* 12.1*  NEUTROABS -- --  HGB 10.8* 11.7*  HCT 32.0* 35.0*  MCV 87.0 87.7  PLT 279 322   No results found for this basename: CKTOTAL:3,CKMB:3,CKMBINDEX:3,TROPONINI:3 in the last 72 hours No components found with this basename: POCBNP:3 No results found for this basename: DDIMER:2 in the last 72 hours No results found for this basename: HGBA1C:2 in the last 72 hours No results found for this basename: CHOL:2,HDL:2,LDLCALC:2,TRIG:2,CHOLHDL:2,LDLDIRECT:2 in the last 72 hours No results found for this  basename: TSH,T4TOTAL,FREET3,T3FREE,THYROIDAB in the last 72 hours No results found for this basename: VITAMINB12:2,FOLATE:2,FERRITIN:2,TIBC:2,IRON:2,RETICCTPCT:2 in the last 72 hours  Micro Results: No results found for this or any previous visit (from the past 240 hour(s)).  Studies/Results: No results found.  Medications: I have reviewed the patient's current medications.  Assessment/Plan:  1. Pneumococcal bacteremia and prosthetic valve endocarditis. Continue Rocephin. Per cardiology the patient is not a good surgical candidate.  ID on 09/10/11 recommended that patient get 2 weeks of Rocephin 2 gm BID then 4 weeks of Rocephin 2 gm once a day.  After discussing with pharmacy she has got 19 days of Rocephin 2 gm BID and I will plan on switching today to Rocephin 2 gm once a day.  Patient is to be on this regimen for 4 weeks and is to f/u with Infectious Disease in 2 weeks.  (I have already discussed with the infectious disease Dr. Orvan Falconer). 2. Small subarachnoid hemorrhage, posttraumatic versus septic emboli. Resolved neurology on board and patient on anticoagulation which she is tolerating. 3. Encephalopathy on admission:? Secondary to seizures. On by mouth Vimpat 4. Atrial fibrillation: Controlled ventricular rate and anticoagulated. Due to mechanical heart valve patient will require INR to be 2.5-3.5 range.  Per discussion with pharmacy patient has already been bridged to coumadin. Last INR 2.8 5. Hypertension: will continue to monitor if persistantly elevated will plan on increasing antihypertensive medication. 6. Status post permanent cardiac pacemaker 7. Anemia: Stable   Disposition:  Will have to discuss with ID for recommended abx regimen as outpatient given pt is not a surgical candidate. As mentioned above in # 1 will have patient on rocephin 2 gm once a day for the next four weeks.  She will likely be able to be discharged tomorrow.  Pt is to f/u with ID in 2 weeks post  discharge.      LOS: 21 days   Penny Pia M.D.  [facility] 09/27/2011, 5:45 PM

## 2011-09-28 LAB — CBC
HCT: 30 % — ABNORMAL LOW (ref 36.0–46.0)
MCH: 29.6 pg (ref 26.0–34.0)
MCV: 86.2 fL (ref 78.0–100.0)
Platelets: 295 10*3/uL (ref 150–400)
RBC: 3.48 MIL/uL — ABNORMAL LOW (ref 3.87–5.11)
WBC: 13.7 10*3/uL — ABNORMAL HIGH (ref 4.0–10.5)

## 2011-09-28 MED ORDER — WARFARIN SODIUM 5 MG PO TABS
5.0000 mg | ORAL_TABLET | Freq: Once | ORAL | Status: AC
  Administered 2011-09-28: 5 mg via ORAL
  Filled 2011-09-28: qty 1

## 2011-09-28 MED ORDER — LACOSAMIDE 100 MG PO TABS: 100.0000 mg | ORAL_TABLET | Freq: Two times a day (BID) | ORAL | Status: DC

## 2011-09-28 MED ORDER — METOPROLOL TARTRATE 25 MG PO TABS: 37.5000 mg | ORAL_TABLET | Freq: Two times a day (BID) | ORAL | Status: DC

## 2011-09-28 MED ORDER — DEXTROSE 5 % IV SOLN: 2.0000 g | INTRAVENOUS | Status: AC

## 2011-09-28 MED ORDER — ACETAMINOPHEN 325 MG PO TABS: 650.0000 mg | ORAL_TABLET | Freq: Four times a day (QID) | ORAL | Status: AC | PRN

## 2011-09-28 MED ORDER — DIGOXIN 125 MCG PO TABS: 0.1250 mg | ORAL_TABLET | Freq: Every day | ORAL | Status: DC

## 2011-09-28 NOTE — Progress Notes (Addendum)
ANTICOAGULATION CONSULT NOTE - Follow Up Consult  Pharmacy Consult for Coumadin Indication: MVR  Allergies  Allergen Reactions  . Codeine Nausea And Vomiting    Patient Measurements: Height: 5\' 1"  (154.9 cm) Weight: 110 lb 11.2 oz (50.213 kg) IBW/kg (Calculated) : 47.8  Adjusted Body Weight:   Vital Signs: Temp: 98.3 F (36.8 C) (01/07 1320) Temp src: Oral (01/07 1320) BP: 154/76 mmHg (01/07 1320) Pulse Rate: 67  (01/07 1320)  Labs:  Basename 09/28/11 0607 09/27/11 0741 09/26/11 0530  HGB 10.3* 10.8* --  HCT 30.0* 32.0* 35.0*  PLT 295 279 322  APTT -- -- --  LABPROT 32.9* 30.1* 26.6*  INR 3.16* 2.82* 2.41*  HEPARINUNFRC -- -- <0.10*  CREATININE -- -- --  CKTOTAL -- -- --  CKMB -- -- --  TROPONINI -- -- --   Estimated Creatinine Clearance: 50.1 ml/min (by C-G formula based on Cr of 0.66).   Medications:  Scheduled:     . cefTRIAXone (ROCEPHIN)  IV  2 g Intravenous Q24H  . digoxin  0.125 mg Oral Daily  . famotidine  20 mg Oral BID  . lacosamide  100 mg Oral BID  . metoprolol tartrate  25 mg Oral BID  . warfarin  7.5 mg Oral ONCE-1800  . DISCONTD: cefTRIAXone (ROCEPHIN)  IV  2 g Intravenous Q12H    Assessment: 71 yo female patient on chronic coumadin for h/o MVR. INR is at goal.    Goal of Therapy:  INR=2.5-3.5   Plan:  1. Coumadin 5mg  po today.  2. Recommend continue home dose on discharge- 5mg  daily.   Wendie Simmer, PharmD, BCPS Clinical Pharmacist  Pager: [phone]  09/28/2011,3:05 PM

## 2011-09-28 NOTE — Progress Notes (Signed)
Clinical Social Worker continuing to follow for discharge planning. Pt has chosen bed at [facility]. Pt awaiting PICC line placement before discharging to facility. Per RN, RN has been in communication with IV team who stated they were unsure if they will be able to place PICC line today. MD and facility notified. Plan for discharge 1/8 once PICC line is placed. Family notified of anticipated discharge on Tuesday 1/8 and expressed understanding. Clinical Social Worker to facilitate pt discharge needs once pt PICC line is placed.  Jacklynn Lewis, MSW, LCSWA  Clinical Social Work [phone]

## 2011-09-28 NOTE — Progress Notes (Signed)
Subjective: No complaints. Questions about PICC line to be placed. Pt's pastor at bedside.  Objective:   Intake/Output Summary (Last 24 hours) at 09/28/11 1612 Last data filed at 09/28/11 1300  Gross per 24 hour  Intake   1190 ml  Output      2 ml  Net   1188 ml    Filed Vitals:   09/28/11 1320  BP: 154/76  Pulse: 67  Temp: 98.3 F (36.8 C)  Resp: 20   Physical Exam:  Gen: Awake, alert in chair in NAD CV: S1S2 RRR, no LE edema Resp: CTAB, no w/r/c, no increased WOB GI: abd soft, NT/ND, BS+ Neuro: no focal deficits on exam Psych: flat affect, but pleasant  Lab Results: @labtest @  Micro Results: No results found for this or any previous visit (from the past 240 hour(s)).  Studies/Results: Ct Angio Head W/cm &/or Wo Cm  09/08/2011  *RADIOLOGY REPORT*  Clinical Data:  2-week history of fever and malaise.  Possible right common carotid artery dissections seen on CT cervical spine.  CT ANGIOGRAPHY HEAD AND NECK  Technique:  Multidetector CT imaging of the head and neck was performed using the standard protocol during bolus administration of intravenous contrast.  Multiplanar CT image reconstructions including MIPs were obtained to evaluate the vascular anatomy. Carotid stenosis measurements (when applicable) are obtained utilizing NASCET criteria, using the distal internal carotid diameter as the denominator.  Contrast: OMNIPAQUE IOHEXOL 350 MG/ML IV SOLN an  Comparison:  CT head and C-spine earlier today.  CTA NECK  Findings:  There is reflux of contrast into the left internal jugular vein.  This may be related to the left sided pacemaker placement.  There is fair opacification of the craniocerebral vasculature.  Atheromatous change can be seen in the transverse arch.  There is considerable Hounsfield artifact from the pacemaker battery pack. There is no visible proximal great vessel dissection or stenosis.  Both carotid bifurcations are widely patent.  Mild nonstenotic  calcific atheromatous change can be seen on the right.  Cervical internal carotid arteries tortuous but patent without dissection. Both vertebrals are patent and equal in size through the neck. Possible 15 x 16 mm right thyroid nodule.  Consider thyroid ultrasound for further evaluation.  Lung apices clear.   Review of the MIP images confirms the above findings.  IMPRESSION: No visible proximal great vessel dissection within limits of detection by artifact from pacemaker battery pack.  No evidence for flow reducing lesion in the right or left carotid system.  CTA HEAD  Findings:  Calcific nonstenotic atheromatous change of the skull base, cavernous, and supraclinoid internal carotid arteries.  No visible proximal stenosis of the anterior, middle, or posterior cerebral arteries.  Basilar artery patent without focal stenosis. No visible intracranial aneurysm.  Post infusion imaging of the head reveals no abnormal enhancement.  Chronic left basal ganglia infarct redemonstrated.  Left frontal subarachnoid blood appears slightly greater than was seen earlier. It is possible a component of this increased conspicuity relates to enhancement postcontrast. Recommend repeat noncontrast examination for continued surveillance.   Review of the MIP images confirms the above findings.  IMPRESSION: No proximal carotid or basilar stenosis.  No visible intracranial dissection.  Suspect slight increase in left frontal subarachnoid blood. I cannot completely exclude that this increased prominence relates to a component of enhancement Recommend repeat noncontrast imaging in 24-48 hours.  Original Report Authenticated By: Elsie Stain, M.D.   Dg Chest 2 View  09/06/2011  *RADIOLOGY REPORT*  Clinical Data: Rule out infiltrate. Confusion.  CHEST - 2 VIEW  Comparison: 02/20/2008  Findings:  Heart size appears mildly enlarged.  Prior median sternotomy CABG procedure.  Left chest wall pacer device is noted with lead in the right  ventricle.  There are no pleural effusions or interstitial edema.  Chronic bronchitic changes are noted bilaterally.  IMPRESSION:  1.  Cardiac enlargement. 2.  No acute changes.  Original Report Authenticated By: Rosealee Albee, M.D.   Ct Head Wo Contrast  09/16/2011  *RADIOLOGY REPORT*  Clinical Data: Follow up subarachnoid hemorrhage  CT HEAD WITHOUT CONTRAST  Technique:  Contiguous axial images were obtained from the base of the skull through the vertex without contrast.  Comparison: 09/13/2011  Findings: Near complete resolution of prior left frontal subarachnoid hemorrhage.  No parenchymal hemorrhage or new extra- axial hemorrhage is seen.  No mass lesion, mass effect, or midline shift.  Subcortical white matter and periventricular small vessel ischemic changes.  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  Mild global cortical atrophy.  No ventriculomegaly.  The visualized paranasal sinuses are essentially clear. The mastoid air cells are unopacified.  No evidence of calvarial fracture.  IMPRESSION: No evidence of acute intracranial abnormality.  Near complete resolution of prior left frontal subarachnoid hemorrhage.  Original Report Authenticated By: Charline Bills, M.D.   Ct Head Wo Contrast  09/13/2011  *RADIOLOGY REPORT*  Clinical Data: New onset headache and neck soreness.  CT HEAD WITHOUT CONTRAST  Technique:  Contiguous axial images were obtained from the base of the skull through the vertex without contrast.  Comparison: Head CT scans 09/10/2011 and 09/09/2011.  Findings: Small amount of subarachnoid hemorrhage in the left frontal lobe continues to resolve.  No new hemorrhage is identified.  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  No midline shift, hydrocephalus or abnormal extra-axial fluid collection is identified.  IMPRESSION:  1.  No acute finding. 2.  Resolving subarachnoid hemorrhage on the left.  Original Report Authenticated By: Bernadene Bell. Maricela Curet, M.D.   Ct Head  Wo Contrast  09/10/2011  *RADIOLOGY REPORT*  Clinical Data: Follow up subarachnoid hemorrhage  CT HEAD WITHOUT CONTRAST  Technique:  Contiguous axial images were obtained from the base of the skull through the vertex without contrast.  Comparison: 09/09/2011  Findings: Hyperdense subarachnoid hemorrhage over the convexity left frontal region has improved.  No new areas of hemorrhage are identified.  No subdural hemorrhage is present.  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  No acute infarct.  Negative for mass lesion.  IMPRESSION: Small areas of subarachnoid hemorrhage over the convexity on the left have improved.  No new area of hemorrhage or infarction.  Original Report Authenticated By: Camelia Phenes, M.D.   Ct Head Wo Contrast  09/09/2011  *RADIOLOGY REPORT*  Clinical Data: Followup subarachnoid hemorrhage.  CT HEAD WITHOUT CONTRAST  Technique:  Contiguous axial images were obtained from the base of the skull through the vertex without contrast.  Comparison: 09/07/2011 CT and 11/10/2005 MR.  Findings: Compared to the 09/07/2011 noncontrast CT examination. There has been an increase in amount of subarachnoid blood in the left frontal region.  Etiology of this is indeterminate.  Result of venous thrombosis / infarct not excluded.  Nonspecific white matter type changes most notable left frontal region.  Remote left caudate head infarct with encephalomalacia. This is new compared to 2007 MR. No CT evidence of large acute infarct.  Small acute infarct cannot be excluded by CT.  No  intracranial mass lesion detected on this unenhanced exam.  IMPRESSION: Compared to the 09/07/2011 noncontrast CT examination.  There has been an increase in amount of subarachnoid blood in the left frontal region.  Etiology of this is indeterminate. Please see above.  Original Report Authenticated By: Fuller Canada, M.D.   Ct Head Wo Contrast  09/07/2011  *RADIOLOGY REPORT*  Clinical Data: Headaches  CT HEAD  WITHOUT CONTRAST  Technique:  Contiguous axial images were obtained from the base of the skull through the vertex without contrast.  Comparison: [facility] brain dated 11/10/2005  Findings: Tiny hyperdense foci overlying the left frontal lobe (series 2/images 14 and 22) and left medial frontal lobe (series 2/image 19), possibly reflecting subarachnoid hemorrhage.  No mass lesion, mass effect, or midline shift.  No CT evidence of acute infarction.  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  Subcortical white matter and periventricular small vessel ischemic changes.  Mildly age related atrophy.  No ventriculomegaly.  The visualized paranasal sinuses are essentially clear. The mastoid air cells are unopacified.  No evidence of calvarial fracture.  IMPRESSION: Possible tiny foci of subarachnoid hemorrhage overlying the left frontal lobe, as described above.  Old lacunar infarct with small vessel ischemic changes  Critical Value/emergent results were called by telephone at the time of interpretation on 09/07/2011  at 1810 hours  to  Dr Delford Field, who verbally acknowledged these results.  Original Report Authenticated By: Charline Bills, M.D.   Ct Angio Neck W/cm &/or Wo/cm  09/08/2011  *RADIOLOGY REPORT*  Clinical Data:  2-week history of fever and malaise.  Possible right common carotid artery dissections seen on CT cervical spine.  CT ANGIOGRAPHY HEAD AND NECK  Technique:  Multidetector CT imaging of the head and neck was performed using the standard protocol during bolus administration of intravenous contrast.  Multiplanar CT image reconstructions including MIPs were obtained to evaluate the vascular anatomy. Carotid stenosis measurements (when applicable) are obtained utilizing NASCET criteria, using the distal internal carotid diameter as the denominator.  Contrast: OMNIPAQUE IOHEXOL 350 MG/ML IV SOLN an  Comparison:  CT head and C-spine earlier today.  CTA NECK  Findings:  There is reflux of contrast  into the left internal jugular vein.  This may be related to the left sided pacemaker placement.  There is fair opacification of the craniocerebral vasculature.  Atheromatous change can be seen in the transverse arch.  There is considerable Hounsfield artifact from the pacemaker battery pack. There is no visible proximal great vessel dissection or stenosis.  Both carotid bifurcations are widely patent.  Mild nonstenotic calcific atheromatous change can be seen on the right.  Cervical internal carotid arteries tortuous but patent without dissection. Both vertebrals are patent and equal in size through the neck. Possible 15 x 16 mm right thyroid nodule.  Consider thyroid ultrasound for further evaluation.  Lung apices clear.   Review of the MIP images confirms the above findings.  IMPRESSION: No visible proximal great vessel dissection within limits of detection by artifact from pacemaker battery pack.  No evidence for flow reducing lesion in the right or left carotid system.  CTA HEAD  Findings:  Calcific nonstenotic atheromatous change of the skull base, cavernous, and supraclinoid internal carotid arteries.  No visible proximal stenosis of the anterior, middle, or posterior cerebral arteries.  Basilar artery patent without focal stenosis. No visible intracranial aneurysm.  Post infusion imaging of the head reveals no abnormal enhancement.  Chronic left basal ganglia infarct redemonstrated.  Left  frontal subarachnoid blood appears slightly greater than was seen earlier. It is possible a component of this increased conspicuity relates to enhancement postcontrast. Recommend repeat noncontrast examination for continued surveillance.   Review of the MIP images confirms the above findings.  IMPRESSION: No proximal carotid or basilar stenosis.  No visible intracranial dissection.  Suspect slight increase in left frontal subarachnoid blood. I cannot completely exclude that this increased prominence relates to a component  of enhancement Recommend repeat noncontrast imaging in 24-48 hours.  Original Report Authenticated By: Elsie Stain, M.D.   Ct Cervical Spine W Contrast  09/07/2011  *RADIOLOGY REPORT*  Clinical Data: C-spine tenderness, evaluate for fracture or abscess  CT CERVICAL SPINE WITH CONTRAST  Technique:  Multidetector CT imaging of the cervical spine was performed during intravenous contrast administration. Multiplanar CT image reconstructions were also generated.  Contrast: OMNIPAQUE IOHEXOL 300 MG/ML IV SOLN  Comparison: None.  Findings: Normal cervical lordosis.  No evidence of fracture or dislocation.  Vertebral body heights are maintained.  The dens appears intact.  No prevertebral soft tissue swelling.  Mild multilevel degenerative changes.  No epidural abscess/fluid collection is seen.  There is a possible linear filling defect within the right common carotid artery (series 606/image 69, series 605/image 4), and although this may reflect artifact at the thoracic inlet, a focal dissection cannot be excluded.  IMPRESSION: Apparent linear filling defect within the right common carotid artery, possibly artifactual, although focal dissection cannot be excluded.  Consider CTA/MRA neck for further characterization as clinically warranted.  No evidence of fracture or dislocation.  No epidural abscess/fluid collection is seen.  Critical Value/emergent results were called by telephone at the time of interpretation on 09/07/2011  at 1810 hours  to  Dr Delford Field, who verbally acknowledged these results.  Original Report Authenticated By: Charline Bills, M.D.   Ct Thoracic Spine Wo Contrast  09/10/2011  *RADIOLOGY REPORT*  Clinical Data:  Leg weakness  CT THORACIC AND LUMBAR SPINE WITHOUT CONTRAST  Technique:  Multidetector CT imaging of the thoracic and lumbar spine was performed without contrast. Multiplanar CT image reconstructions were also generated.  Comparison:  None  CT THORACIC SPINE  Findings:  No  fracture or dislocation is seen.  Mild multilevel degenerative changes of the thoracic spine, most prominent at T11-12.  Lower cervical spine is incompletely imaged.  Left paracentral disc herniation at C5-6 (series 9/image 14).  Visualized thyroid is unremarkable.  Cardiomegaly.  Coronary atherosclerosis.  Valve annuloplasty.  Atherosclerotic calcifications of the aortic arch.  Pacemaker leads.  Evaluation of the lung parenchyma is constrained by respiratory motion.  Scattered patchy left upper lobe opacities. Mild right middle lobe opacity / scarring.  Small bilateral pleural effusions with associated lower lobe atelectasis.  IMPRESSION: No fracture or dislocation is seen.  Mild multilevel degenerative changes, most prominent at T11-12.  Left paracentral disc herniation at C5-6.  Evaluation of the lung parenchyma is constrained by respiratory motion.  Patchy left upper lobe and right middle lobe opacities. Small bilateral pleural effusions with associated lower lobe atelectasis.  CT LUMBAR SPINE  Findings: No fracture or dislocation is seen.  Very mild multilevel degenerative changes.  Atherosclerotic calcifications of the abdominal aorta and branch vessels.  Suspected calcified uterine fibroids.  IMPRESSION: No fracture or dislocation is seen.  Very mild multilevel degenerative changes.  Original Report Authenticated By: Charline Bills, M.D.   Ct Lumbar Spine Wo Contrast  09/10/2011  *RADIOLOGY REPORT*  Clinical Data:  Leg weakness  CT THORACIC  AND LUMBAR SPINE WITHOUT CONTRAST  Technique:  Multidetector CT imaging of the thoracic and lumbar spine was performed without contrast. Multiplanar CT image reconstructions were also generated.  Comparison:  None  CT THORACIC SPINE  Findings:  No fracture or dislocation is seen.  Mild multilevel degenerative changes of the thoracic spine, most prominent at T11-12.  Lower cervical spine is incompletely imaged.  Left paracentral disc herniation at C5-6 (series 9/image  14).  Visualized thyroid is unremarkable.  Cardiomegaly.  Coronary atherosclerosis.  Valve annuloplasty.  Atherosclerotic calcifications of the aortic arch.  Pacemaker leads.  Evaluation of the lung parenchyma is constrained by respiratory motion.  Scattered patchy left upper lobe opacities. Mild right middle lobe opacity / scarring.  Small bilateral pleural effusions with associated lower lobe atelectasis.  IMPRESSION: No fracture or dislocation is seen.  Mild multilevel degenerative changes, most prominent at T11-12.  Left paracentral disc herniation at C5-6.  Evaluation of the lung parenchyma is constrained by respiratory motion.  Patchy left upper lobe and right middle lobe opacities. Small bilateral pleural effusions with associated lower lobe atelectasis.  CT LUMBAR SPINE  Findings: No fracture or dislocation is seen.  Very mild multilevel degenerative changes.  Atherosclerotic calcifications of the abdominal aorta and branch vessels.  Suspected calcified uterine fibroids.  IMPRESSION: No fracture or dislocation is seen.  Very mild multilevel degenerative changes.  Original Report Authenticated By: Charline Bills, M.D.   Dg Chest Port 1 View  09/17/2011  *RADIOLOGY REPORT*  Clinical Data: Respiratory failure, shortness of breath  PORTABLE CHEST - 1 VIEW  Comparison: 09/11/2011; 09/09/2011; 09/08/2011; chest CT - 09/10/2011  Findings:  Grossly unchanged enlarged cardiac silhouette and mediastinal contours post median sternotomy and valve replacement given decreased lung volumes.  Grossly unchanged positioning of single lead pacemaker with tip overlying the expected location of the right ventricle.  Decreased lung volumes with corresponding interval increase in right perihilar heterogeneous opacities. Cephalization of flow with thickening along the right minor fissure.  No definite pleural effusion or pneumothorax.  Unchanged bones.  IMPRESSION: 1.  Decreased lung volumes with interval increase in right  perihilar opacities to a represent atelectasis. 2.  Query mild pulmonary edema.  Original Report Authenticated By: Waynard Reeds, M.D.   Dg Chest Port 1 View  09/11/2011  *RADIOLOGY REPORT*  Clinical Data: Respiratory failure  PORTABLE CHEST - 1 VIEW  Comparison: 09/09/2011  Findings: Heart is moderately enlarged.  Single lead left subclavian pacemaker device unchanged.  Low volumes and hazy airspace opacities improved.  Stable mid lung right-sided linear opacity.  Normal pulmonary vascularity.  IMPRESSION: Improved aeration and airspace opacities.  Original Report Authenticated By: Donavan Burnet, M.D.   Dg Chest Port 1 View  09/09/2011  *RADIOLOGY REPORT*  Clinical Data: No edema, evaluate endotracheal tube position  PORTABLE CHEST - 1 VIEW  Comparison: Portable chest x-ray of 09/08/2011  Findings: No endotracheal tube is visible.  There is little change in poor aeration and basilar volume loss.  Cardiomegaly is stable. 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  IMPRESSION: No endotracheal tube.  No change in poor aeration with basilar volume loss.  Original Report Authenticated By: Juline Patch, M.D.   Dg Chest Port 1 View  09/08/2011  *RADIOLOGY REPORT*  Clinical Data: Respiratory distress.  PORTABLE CHEST - 1 VIEW  Comparison: 09/06/2011  Findings: Cardiomegaly.  Mild vascular congestion.  Interstitial prominence could reflect interstitial edema.  This is increased slightly since prior study.  No  effusions.  No acute bony abnormality.  IMPRESSION: Increasing interstitial prominence, question interstitial edema.  Original Report Authenticated By: Cyndie Chime, M.D.   Medications: Scheduled Meds:   . cefTRIAXone (ROCEPHIN)  IV  2 g Intravenous Q24H  . digoxin  0.125 mg Oral Daily  . famotidine  20 mg Oral BID  . lacosamide  100 mg Oral BID  . metoprolol tartrate  25 mg Oral BID  . warfarin  5 mg Oral ONCE-1800  . warfarin  7.5 mg Oral ONCE-1800  . DISCONTD: cefTRIAXone  (ROCEPHIN)  IV  2 g Intravenous Q12H   Continuous Infusions:   . sodium chloride 20 mL/hr at 09/27/11 1245   PRN Meds:.sodium chloride, acetaminophen, diphenhydrAMINE, diphenhydrAMINE, naloxone, ondansetron (ZOFRAN) IV, polyethylene glycol, sodium chloride  Assessment/Plan: See complete d/c summary completed on 09/27/11. Pt remains stable. Awaiting PICC line placement for continued IV antibiotic therapy at SNF. Otherwise, pt medically stable for d/c.   Pt doing well, no complaints.  D/C tomorrow.   Cordelia Pen, NP  [phone]   LOS: 22 days    09/28/2011, 4:12 PM

## 2011-09-28 NOTE — Progress Notes (Addendum)
Current IV is out of date. Will check with MD to determine if long-term IV access (central line) is needed before restarting.  Pt is disoriented to place and time. She was not sure what type of facility she was in, and answered April for the month.

## 2011-09-29 ENCOUNTER — Inpatient Hospital Stay (HOSPITAL_COMMUNITY): Payer: Medicare Other

## 2011-09-29 LAB — URINE MICROSCOPIC-ADD ON

## 2011-09-29 LAB — COMPREHENSIVE METABOLIC PANEL
ALT: 69 U/L — ABNORMAL HIGH (ref 0–35)
AST: 111 U/L — ABNORMAL HIGH (ref 0–37)
Albumin: 2.6 g/dL — ABNORMAL LOW (ref 3.5–5.2)
CO2: 20 mEq/L (ref 19–32)
Chloride: 100 mEq/L (ref 96–112)
Creatinine, Ser: 0.71 mg/dL (ref 0.50–1.10)
GFR calc non Af Amer: 86 mL/min — ABNORMAL LOW (ref >90)
Sodium: 132 mEq/L — ABNORMAL LOW (ref 135–145)
Total Bilirubin: 0.2 mg/dL — ABNORMAL LOW (ref 0.3–1.2)

## 2011-09-29 LAB — CBC
HCT: 30.3 % — ABNORMAL LOW (ref 36.0–46.0)
Hemoglobin: 10.3 g/dL — ABNORMAL LOW (ref 12.0–15.0)
MCH: 29.3 pg (ref 26.0–34.0)
MCV: 86.3 fL (ref 78.0–100.0)
RBC: 3.51 MIL/uL — ABNORMAL LOW (ref 3.87–5.11)
WBC: 14 10*3/uL — ABNORMAL HIGH (ref 4.0–10.5)

## 2011-09-29 LAB — URINALYSIS, ROUTINE W REFLEX MICROSCOPIC
Glucose, UA: NEGATIVE mg/dL
Leukocytes, UA: NEGATIVE
Nitrite: NEGATIVE
Specific Gravity, Urine: 1.014 (ref 1.005–1.030)
pH: 6 (ref 5.0–8.0)

## 2011-09-29 LAB — PRO B NATRIURETIC PEPTIDE: Pro B Natriuretic peptide (BNP): 1779 pg/mL — ABNORMAL HIGH (ref 0–125)

## 2011-09-29 MED ORDER — WARFARIN SODIUM 2.5 MG PO TABS
2.5000 mg | ORAL_TABLET | Freq: Once | ORAL | Status: AC
  Administered 2011-09-29: 2.5 mg via ORAL
  Filled 2011-09-29 (×2): qty 1

## 2011-09-29 MED ORDER — FUROSEMIDE 10 MG/ML IJ SOLN
20.0000 mg | Freq: Two times a day (BID) | INTRAMUSCULAR | Status: DC
  Administered 2011-09-29 – 2011-09-30 (×2): 20 mg via INTRAVENOUS
  Filled 2011-09-29 (×3): qty 2

## 2011-09-29 MED ORDER — SODIUM CHLORIDE 0.9 % IJ SOLN
10.0000 mL | INTRAMUSCULAR | Status: DC | PRN
  Administered 2011-09-30: 10 mL

## 2011-09-29 NOTE — Discharge Summary (Addendum)
Patient ID: Carrie [name] MRN: [id number] DOB/AGE: 01/29/1941 71 y.o.  Admit date: 09/06/2011 Discharge date: 09/29/2011  Primary Care Physician:  Geraldo Pitter, MD, MD  Discharge Diagnoses:   Present on Admission:  .Sepsis .Pneumococcal septicemia .Encephalopathy acute .Endocarditis of prosthetic valve .UTI (lower urinary tract infection) .Atrial fibrillation with rapid ventricular response,  S/P mitral valve replacement, [employer] Jude .Subarachnoid hemorrhage, embolic secondary to endocaditis .Presence of permanent cardiac pacemaker *Chronic anticoagulation  Current Discharge Medication List    START taking these medications   Details  acetaminophen (TYLENOL) 325 MG tablet Take 2 tablets (650 mg total) by mouth every 6 (six) hours as needed. Qty: 30 tablet, Refills: 0    dextrose 5 % SOLN 50 mL with cefTRIAXone 2 G SOLR 2 g Inject 2 g into the vein daily. Qty: 30 vial, Refills: 0    lacosamide 100 MG TABS Take 1 tablet (100 mg total) by mouth 2 (two) times daily. Qty: 60 tablet, Refills: 0      CONTINUE these medications which have CHANGED   Details  digoxin (LANOXIN) 0.125 MG tablet Take 1 tablet (0.125 mg total) by mouth daily. Qty: 30 tablet, Refills: 0    metoprolol tartrate (LOPRESSOR) 25 MG tablet Take 1.5 tablets (37.5 mg total) by mouth 2 (two) times daily. Qty: 45 tablet, Refills: 0      CONTINUE these medications which have NOT CHANGED   Details  famotidine (PEPCID) 20 MG tablet Take 20 mg by mouth 2 (two) times daily.      warfarin (COUMADIN) 5 MG tablet Take 1/2 tablet 2.5 mg by mouth daily.       STOP taking these medications     diltiazem (CARDIZEM LA) 300 MG 24 hr tablet      losartan (COZAAR) 50 MG tablet         Consults:  Cardiology, [facility], Dr. Nanetta Batty Neurology, Dr. Lesia Sago Cardiothoracic Surgery, Dr. Gwenith Daily. [facility] Infectious Disease, Dr. Cliffton Asters  Brief H and P: From the admission note:   By Dr. Marchelle Gearing  Assessment and Plan  *Sepsis  Encephalopathy acute  Flu-like symptoms  UTI (lower urinary tract infection)  Atrial fibrillation with rapid ventricular response  S/P mitral valve replacement  Pneumonia   She meets criteria for systemic inflammatory response syndrome. She is septic as evidenced by high pro-calcitonin. Currently no evidence of occult septic shock of frank septic shock due to normal lactate a normal blood pressure. She is delirious and has acute encephalopathy this is possibly from her sepsis syndrome. Source is likely pneumonia following flulike illness. Urinary tract infection is another possibility. Per family she has improved in the emergency room with hydration and antibiotics so far  Aggressively fluid hydrate  Cover for urinary tract infection and community-acquired pneumonia with ceftriaxone and azithromycin  Cover for potential fluids Tamiflu  Check sepsis biomarkers, tract them  Check respiratory virus and influenza virus panel be a PCR  Check pancultures  Check echocardiogram per record it has been several years since prior evaluation  Depending on course consult [facility]: Family interested in this anyways  Rule out MI  IV heparin for mitral valve the pharmacy protocol  Pepcid for stress ulcer prophylaxis  Monitor CAM-ICU delirium test  Updated family that she could deteriorate and in which case she would need central line in intubation they're agreeable for this patient is full code   Hospital Course:    *Encephalopathy acute - The patient was  felt to have multiple etiologies for her acute encephalopathy including: Septicemia, urinary tract infection, endocarditis, or possible complex partial seizures. Neurology was consulted. An MRI of the brain was not possible secondary to pacemaker. An EEG was performed and found to be negative. However the patient was placed on Vimpat PO and continues to do well. The plan is to stop  with them patent at outpatient followup with neurology in 2 months.  Her encephalopathy slowly resolved over the course of her hospitalization.  Currently I believe the patient is at baseline.   Blood cultures obtained December 16 were found to be positive for Streptococcus pneumoniae a that was pansensitive.  Infectious disease was consulted, and Dr. Cliffton Asters recommended 2 g of Rocephin IV every 12 hours. Over the next 7 days the patient's encephalopathy resolved. Her antibiotic therapy will  continue with 1 g of Rocephin IV for 26 days (through 10/25/2011).  Although this long term treatment is primarily for her endocarditis. A PICC line was placed to accommodate her antibiotic therapy. A followup appointment has been scheduled with infectious disease for January 21 at 3:30 PM   Subarachnoid hemorrhage, embolic secondary to endocarditis.  A CT of the head was done on December 17 to evaluate the patient's acute encephalopathy.  A tiny subarachnoid hemorrhage was found in the left frontal lobe.  Neurology was consulted and Dr. Lesia Sago saw the patient he was immediately concerned about cardiogenic emboli to the brain possibly septic. He recommended that cardiology be consulted for TEE.  Anticoagulation was initially held. The patient was restarted on heparin, and now she has been restarted on Coumadin her INR today is 3.3 on 5 mg of Coumadin daily.  Serial CT scans of the head were completed and resolution of her SAH was documented.     Endocarditis of prosthetic valve.  The patient underwent TEE by Dr. Zoila Shutter on 09/23/11 looking for possible endocarditis that would have caused emboli creating a subarachnoid hemorrhage. 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 Recommendations included continued anti-coagulation, full course of antibiotics with repeat blood cultures and a cardiothoracic surgical evaluation for possible valve revision. It was  felt the patient would need a followup TEE in 6 weeks.    The patient was seen by cardiothoracic surgery originally on December 20, and then again on January 4 in light of the TEE results.  Dr. Tyrone Sage commented that this was a very difficult situation now with enlargement of atrial mass while anticoagulation was held. He felt that the patient is a very poor operative candidate and he agreed with anticoagulation and long term antibiotic therapy.  Last Cardiology note:  Mechanical valve endocarditis with vegetation. Mass appears to have grown during anticoagulation. She is now therapeutic on warfarin. She continues on antibiotics and given the fact she is a poor re-do valve candidate (which I agree with), she will most likely need lifelong suppressive antibiotics after finishing her initial course. Would be helpful to ask ID for recommendations regarding suppressive antibiotics. Would work toward disposition, likely in a SNF facility. Follow-up with Dr. Clarene Duke in our office as an outpatient. The office will call the patient with an appointment.     Atrial fibrillation with rapid ventricular response  The patient's rate is now controlled on medications and her coumadin has been restarted.  INR is therapeutic but needs to be followed at the Skilled Nursing Facility.  The patient was originally scheduled for discharge on January 8.  However  we noticed that her white count was trending up. Urinalysis and chest x-ray were checked and found to be within normal limits. However, her BNP was elevated at 1779. She received a dose of IV Lasix 20 mg. And this morning her white count is down to 11.8.  On January 9 her INR is 3.57.  Physical Exam on Discharge: General: Alert, awake, oriented x3, in no acute distress. HEENT: No bruits, no goiter. Heart: Irregular rate and rhythm, without murmurs, rubs, gallops. Lungs: Clear to auscultation bilaterally. Abdomen: Soft, nontender, nondistended, positive bowel  sounds. Extremities: No clubbing cyanosis or edema with positive pedal pulses. Neuro: Grossly intact, nonfocal.  Filed Vitals:   09/28/11 1320 09/28/11 2038 09/29/11 0533 09/29/11 1135  BP: 154/76 150/82 139/70 133/78  Pulse: 67 72 70 96  Temp: 98.3 F (36.8 C) 98.4 F (36.9 C) 98.1 F (36.7 C)   TempSrc: Oral Oral Oral   Resp: 20 18 18    Height:      Weight:   50.5 kg (111 lb 5.3 oz)   SpO2: 98% 97% 98%      Intake/Output Summary (Last 24 hours) at 09/30/11 0842 Last data filed at 09/30/11 0400  Gross per 24 hour  Intake    858 ml  Output   1450 ml  Net   -592 ml    Basic Metabolic Panel:  Lab 09/30/11 9147 09/29/11 1659  NA 137 132*  K 3.6 3.8  CL 103 100  CO2 23 20  GLUCOSE 116* 117*  BUN 14 15  CREATININE 0.69 0.71  CALCIUM 9.6 9.2  MG -- --  PHOS -- --   Liver Function Tests:  Lab 09/29/11 1659 09/24/11 0635  AST 111* 209*  ALT 69* 128*  ALKPHOS 114 129*  BILITOT 0.2* 0.3  PROT 8.6* 8.4*  ALBUMIN 2.6* 2.4*   CBC:  Lab 09/30/11 0558 09/29/11 0638  WBC 11.8* 14.0*  NEUTROABS -- --  HGB 10.6* 10.3*  HCT 31.6* 30.3*  MCV 87.5 86.3  PLT 338 333   Coagulation:  Lab 09/30/11 0558 09/29/11 0638 09/28/11 0607 09/27/11 0741  LABPROT 36.2* 34.1* 32.9* 30.1*  INR 3.57* 3.31* 3.16* 2.82*    Significant Diagnostic Studies:  Dg Chest 2 View  09/06/2011 *RADIOLOGY REPORT* Clinical Data: Rule out infiltrate. Confusion. CHEST - 2 VIEW Comparison: 02/20/2008 Findings: Heart size appears mildly enlarged. Prior median sternotomy CABG procedure. Left chest wall pacer device is noted with lead in the right ventricle. There are no pleural effusions or interstitial edema. Chronic bronchitic changes are noted bilaterally. IMPRESSION: 1. Cardiac enlargement. 2. No acute changes. Original Report Authenticated By: Rosealee Albee, M.D.   09/07/2011 *RADIOLOGY REPORT* Clinical Data: Headaches CT HEAD WITHOUT CONTRAST . IMPRESSION: Possible tiny foci of subarachnoid  hemorrhage overlying the left frontal lobe, as described above. 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 Original Report Authenticated By: Charline Bills, M.D.   Ct Cervical Spine W Contrast  09/07/2011 *RADIOLOGY REPORT* Clinical Data: C-spine tenderness, evaluate for fracture or abscess CT CERVICAL SPINE WITH CONTRAST . IMPRESSION: Apparent linear filling defect within the right common carotid artery, possibly artifactual, although focal dissection cannot be excluded. Consider CTA/MRA neck for further characterization as clinically warranted. No evidence of fracture or dislocation. No epidural abscess/fluid collection is seen.  Ct Angio Neck W/cm &/or Wo/cm  09/08/2011 *CTA NECK  IMPRESSION: No visible proximal great vessel dissection within limits of detection by artifact from pacemaker battery pack. No evidence for flow reducing lesion in the right or left carotid system.   CTA HEAD IMPRESSION: No proximal carotid or basilar stenosis. No visible intracranial dissection. Suspect slight increase in left frontal subarachnoid blood. I cannot completely exclude that this increased prominence relates to a component of enhancement Recommend repeat noncontrast imaging in 24-48 hours. Original Report Authenticated By: Elsie Stain, M.D.   Dg Chest Port 1 View  09/08/2011 *RADIOLOGY REPORT*. IMPRESSION: Increasing interstitial prominence, question interstitial edema. Original Report Authenticated By: Cyndie Chime, M.D.   Ct Angio Head W/cm &/or Wo Cm  09/08/2011  CTA NECK. IMPRESSION: No visible proximal great vessel dissection within limits of detection by artifact from pacemaker battery pack. No evidence for flow reducing lesion in the right or left carotid system.   CTA HEAD IMPRESSION: No proximal carotid or  basilar stenosis. No visible intracranial dissection. Suspect slight increase in left frontal subarachnoid blood. I cannot completely exclude that this increased prominence relates to a component of enhancement Recommend repeat noncontrast imaging in 24-48 hours  Dg Chest [facility] 1 View  09/09/2011 *RADIOLOGY REPORT* Clinical Data: No edema, evaluate endotracheal tube position PORTABLE CHEST -  IMPRESSION: No endotracheal tube. No change in poor aeration with basilar volume loss. Original Report Authenticated By: Juline Patch, M.D.   Ct Head Wo Contrast  09/09/2011 *RADIOLOGY REPORT* Clinical Data: Followup subarachnoid hemorrhage. CT HEAD WITHOUT CONTRAST  IMPRESSION: Compared to the 09/07/2011 noncontrast CT examination. There has been an increase in amount of subarachnoid blood in the left frontal region. Etiology of this is indeterminate. Please see above. Original Report Authenticated By: Fuller Canada, M.D.  Ct Head Wo Contrast  Ct Thoracic Spine Wo Contrast  Ct Lumbar Spine Wo Contrast  09/10/2011 *RADIOLOGY REPORT* Clinical Data: Leg weakness CT THORACIC AND LUMBAR SPINE WITHOUT CONTRAST Comparison: None CT THORACIC SPINE  IMPRESSION: No fracture or dislocation is seen. Mild multilevel degenerative changes, most prominent at T11-12. Left paracentral disc herniation at C5-6. Evaluation of the lung parenchyma is constrained by respiratory motion. Patchy left upper lobe and right middle lobe opacities. Small bilateral pleural effusions with associated lower lobe atelectasis.   CT LUMBAR SPINE IMPRESSION: No fracture or dislocation is seen. Very mild multilevel degenerative changes. Original Report Authenticated By: Charline Bills, M.D.   Ct Head Wo Contrast  09/10/2011 *RADIOLOGY REPORT* IMPRESSION: Small areas of subarachnoid hemorrhage over the convexity on the left have improved. No new area of hemorrhage or infarction. Original Report Authenticated By: Camelia Phenes, M.D.   Dg Chest  Port 1 View  09/11/2011 *RADIOLOGY REPORT* Clinical Data: Respiratory failure PORTABLE CHEST - 1 VIEW Comparison: 09/09/2011 Findings:. IMPRESSION: Improved aeration and airspace opacities. Original Report Authenticated By: Donavan Burnet, M.D.   Ct Head Wo Contrast  09/13/2011 *RADIOLOGY REPORT* Clinical Data: New onset headache and neck soreness. CT HEAD WITHOUT CONTRAST  IMPRESSION: 1. No acute finding. 2. Resolving subarachnoid hemorrhage on the left. Original Report Authenticated By: Bernadene Bell. Maricela Curet, M.D.   Ct Head Wo Contrast  09/16/2011 *RADIOLOGY REPORT* Clinical Data: Follow up subarachnoid hemorrhage CT HEAD WITHOUT CONTRAST . IMPRESSION: No evidence of acute intracranial abnormality. Near complete resolution of prior left frontal subarachnoid hemorrhage. Original Report Authenticated By: Charline Bills, M.D.   Dg Chest Port 1 View  09/17/2011 *RADIOLOGY REPORT* Clinical Data: Respiratory failure, shortness of breath PORTABLE CHEST - 1 VIEW Comparison: 09/11/2011; 09/09/2011;  09/08/2011; chest CT - 09/10/2011 Findings: . IMPRESSION: 1. Decreased lung volumes with interval increase in right perihilar opacities to a represent atelectasis. 2. Query mild pulmonary edema. Original Report Authenticated By: Waynard Reeds, M.D.   ECHO: TEE  09/24/11 Study Conclusions  - Left ventricle: Systolic function was vigorous. The estimated ejection fraction was in the range of 65% to 70%. Wall motion was normal; there were no regional wall  motion abnormalities. - Aortic valve: No evidence of vegetation. - Mitral valve: There is a St. Jude mechanical mitral valve. There again appears to be a mobile thrombus/vegetation attached at the strut position in the area of the native anterior mitral leaflet. There is tilting bileaflet motion which appears normal. The mass measures 4 mm x 17 mm and is well visualized in 2D and 3D images. - Left atrium: There is spontaneous echo contrast (smoke) in the left  atrium, suggesting a low flow state. The atrium was dilated. No evidence of thrombus in the appendage. No evidence of thrombus in the atrial cavity or appendage. The appendage is moderate sized with low velocity flow by pulse and color doppler. - Right ventricle: Pacer wire or catheter noted in right ventricle. - Right atrium: The atrium was dilated. Pacer wire or catheter noted in right atrium. - Atrial septum: No defect or patent foramen ovale was identified. - Tricuspid valve: Mild to moderate TR. Mildly thickened leaflets.  Disposition and Follow-up:   Patient will need a follow up TEE in 4 - 6 weeks.  [facility], Dr. Fredirick Maudlin office will call with an appointment.  If they do not call in the next 48 hours please call them to have the patient seen within 1 week.    INR check 10/01/11 at SNF.  INR Goal 2.5 - 3.5.  On January 9 her INR was 3.57.  The patient will be seen by infectious disease (Dr. Cliffton Asters) on 1/21 at 3:30.  Time spent on Discharge: 45 min  Signed: Stephani Police 09/29/2011, 12:43 PM [phone]

## 2011-09-29 NOTE — Discharge Summary (Signed)
Agree with full summary and plan for discharge. Initially patient was scheduled to be discharged but in review of her blood work today, it was noted that for the last 2 days her white blood cell count has been trending upward when was previously as low as 11 and now at 14. She's had no fevers but we will concerned about the possibility of infection. Chest x-ray unremarkable and urinalysis normal. However given amount of IV fluids, suspected possibility of volume overload and acute CHF. DNP checked and found to be elevated at 1779. Mobile thrombus could be playing a role in this and her echocardiogram done on 1/2 was unremarkable for systolic or diastolic heart failure. We'll go ahead and plan to diurese and watch output. If her white blood cell count response to this can likely discharge to facility tomorrow.

## 2011-09-29 NOTE — Progress Notes (Signed)
PT CANCELLATION NOTE  Pt. Receiving PICC at this time. Will re attempt later if patient not discharged to [facility].  Thanks 09/29/2011 Fredrich Birks PTA [phone] pager [phone] office

## 2011-09-29 NOTE — Progress Notes (Signed)
ANTICOAGULATION CONSULT NOTE - Follow Up Consult  Pharmacy Consult for Coumadin Indication: MVR  Allergies  Allergen Reactions  . Codeine Nausea And Vomiting    Patient Measurements: Height: 5\' 1"  (154.9 cm) Weight: 111 lb 5.3 oz (50.5 kg) IBW/kg (Calculated) : 47.8    Vital Signs: Temp: 99 F (37.2 C) (01/08 1345) Temp src: Oral (01/08 1345) BP: 150/90 mmHg (01/08 1345) Pulse Rate: 85  (01/08 1345)  Labs:  Basename 09/29/11 4782 09/28/11 0607 09/27/11 0741  HGB 10.3* 10.3* --  HCT 30.3* 30.0* 32.0*  PLT 333 295 279  APTT -- -- --  LABPROT 34.1* 32.9* 30.1*  INR 3.31* 3.16* 2.82*  HEPARINUNFRC -- -- --  CREATININE -- -- --  CKTOTAL -- -- --  CKMB -- -- --  TROPONINI -- -- --   Estimated Creatinine Clearance: 50.1 ml/min (by C-G formula based on Cr of 0.66).   Medications:  Scheduled:     . cefTRIAXone (ROCEPHIN)  IV  2 g Intravenous Q24H  . digoxin  0.125 mg Oral Daily  . famotidine  20 mg Oral BID  . lacosamide  100 mg Oral BID  . metoprolol tartrate  25 mg Oral BID  . warfarin  5 mg Oral ONCE-1800    Assessment: 71 yo female patient on chronic coumadin for h/o MVR. INR is at goal.  Patient is to be discharged today to SNF.  Goal of Therapy:  INR=2.5-3.5   Plan:  1. Coumadin 2.5 mg po today if still here.  2. Recommend continue home dose on discharge- 5mg  daily.   Wendie Simmer, PharmD, BCPS Clinical Pharmacist  Pager: [phone]  09/29/2011,2:26 PM

## 2011-09-29 NOTE — Progress Notes (Signed)
Patient ID: Carrie [name], female   DOB: [birth year], 71 y.o.   MRN: [id number] Subjective: No complaints.  Awaiting PICC line.  IV Team called this am.  Objective:   Intake/Output Summary (Last 24 hours) at 09/29/11 0940 Last data filed at 09/29/11 0600  Gross per 24 hour  Intake    890 ml  Output      2 ml  Net    888 ml    Filed Vitals:   09/29/11 0533  BP: 139/70  Pulse: 70  Temp: 98.1 F (36.7 C)  Resp: 18   Physical Exam:  Gen: Awake, alert, lying comfortably in bed. CV: S1S2 RRR, no LE edema Resp: CTAB, no w/r/c, no increased WOB GI: abd soft, NT/ND, BS+ Neuro: no focal deficits on exam Psych: A&O x3  Demeanor appropriate, grooming good.  TELE:  Multiple short runs of Vtach today (4 beats - 6 beats).  In reviewing tele from 1/2 until today she's been having occasional runs of V tach.  Will continue to monitor.  Studies/Results: Ct Angio Head W/cm &/or Wo Cm  09/08/2011  *RADIOLOGY REPORT*  Clinical Data:  2-week history of fever and malaise.  Possible right common carotid artery dissections seen on CT cervical spine.  CT ANGIOGRAPHY HEAD AND NECK    IMPRESSION: No proximal carotid or basilar stenosis.  No visible intracranial dissection.  Suspect slight increase in left frontal subarachnoid blood. I cannot completely exclude that this increased prominence relates to a component of enhancement Recommend repeat noncontrast imaging in 24-48 hours.  Original Report Authenticated By: Elsie Stain, M.D.   Dg Chest 2 View  09/06/2011  *RADIOLOGY REPORT*  Clinical Data: Rule out infiltrate. Confusion.  CHEST - 2 VIEW  Comparison: 02/20/2008  Findings:  Heart size appears mildly enlarged.  Prior median sternotomy CABG procedure.  Left chest wall pacer device is noted with lead in the right ventricle.  There are no pleural effusions or interstitial edema.  Chronic bronchitic changes are noted bilaterally.  IMPRESSION:  1.  Cardiac enlargement. 2.  No acute changes.  Original  Report Authenticated By: Rosealee Albee, M.D.   Ct Head Wo Contrast  09/16/2011  *RADIOLOGY REPORT*   IMPRESSION: No evidence of acute intracranial abnormality.  Near complete resolution of prior left frontal subarachnoid hemorrhage.  Original Report Authenticated By: Charline Bills, M.D.   Ct Head Wo Contrast  09/13/2011  *RADIOLOGY REPORT*  IMPRESSION:  1.  No acute finding. 2.  Resolving subarachnoid hemorrhage on the left.  Original Report Authenticated By: Bernadene Bell. Maricela Curet, M.D.   Ct Head Wo Contrast  09/10/2011  *RADIOLOGY REPORT*  Clinical Data: Follow up subarachnoid hemorrhage  CT HEAD WITHOUT CONTRAST   IMPRESSION: Small areas of subarachnoid hemorrhage over the convexity on the left have improved.  No new area of hemorrhage or infarction.  Original Report Authenticated By: Camelia Phenes, M.D.   Ct Head Wo Contrast  09/09/2011  *RADIOLOGY REPORT*  Clinical Data: Followup subarachnoid hemorrhage.  CT HEAD WITHOUT CONTRAST  Technique:  Contiguous axial images were obtained from the base of the skull through the vertex without contrast.  Comparison: 09/07/2011 CT and 11/10/2005 MR.  Findings: Compared to the 09/07/2011 noncontrast CT examination. There has been an increase in amount of subarachnoid blood in the left frontal region.  Etiology of this is indeterminate.  Result of venous thrombosis / infarct not excluded.  Nonspecific white matter type changes most notable left frontal region.  Remote left caudate head infarct  with encephalomalacia. This is new compared to 2007 MR. No CT evidence of large acute infarct.  Small acute infarct cannot be excluded by CT.  No intracranial mass lesion detected on this unenhanced exam.  IMPRESSION: Compared to the 09/07/2011 noncontrast CT examination.  There has been an increase in amount of subarachnoid blood in the left frontal region.  Etiology of this is indeterminate. Please see above.  Original Report Authenticated By: Fuller Canada,  M.D.   Ct Head Wo Contrast  09/07/2011  *RADIOLOGY REPORT*  Clinical Data: Headaches  CT HEAD WITHOUT CONTRAST  Technique:  Contiguous axial images were obtained from the base of the skull through the vertex without contrast.  Comparison: Standard MRI brain dated 11/10/2005  Findings: Tiny hyperdense foci overlying the left frontal lobe (series 2/images 14 and 22) and left medial frontal lobe (series 2/image 19), possibly reflecting subarachnoid hemorrhage.  No mass lesion, mass effect, or midline shift.  No CT evidence of acute infarction.  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  Subcortical white matter and periventricular small vessel ischemic changes.  Mildly age related atrophy.  No ventriculomegaly.  The visualized paranasal sinuses are essentially clear. The mastoid air cells are unopacified.  No evidence of calvarial fracture.  IMPRESSION: Possible tiny foci of subarachnoid hemorrhage overlying the left frontal lobe, as described above.  Old lacunar infarct with small vessel ischemic changes  Critical Value/emergent results were called by telephone at the time of interpretation on 09/07/2011  at 1810 hours  to  Dr Delford Field, who verbally acknowledged these results.  Original Report Authenticated By: Charline Bills, M.D.   Ct Angio Neck W/cm &/or Wo/cm  09/08/2011  *RADIOLOGY REPORT*  Clinical Data:  2-week  IMPRESSION: No proximal carotid or basilar stenosis.  No visible intracranial dissection.  Suspect slight increase in left frontal subarachnoid blood. I cannot completely exclude that this increased prominence relates to a component of enhancement Recommend repeat noncontrast imaging in 24-48 hours.  Original Report Authenticated By: Elsie Stain, M.D.   Ct Cervical Spine W Contrast  09/07/2011  *RADIOLOGY REPORT*  Clinical Data: C-spine tenderness, evaluate for fracture or abscess  CT CERVICAL SPINE WITH CONTRAST  Technique:  Multidetector CT imaging of the cervical spine was performed  during intravenous contrast administration. Multiplanar CT image reconstructions were also generated.  Contrast: OMNIPAQUE IOHEXOL 300 MG/ML IV SOLN  Comparison: None.  Findings: Normal cervical lordosis.  No evidence of fracture or dislocation.  Vertebral body heights are maintained.  The dens appears intact.  No prevertebral soft tissue swelling.  Mild multilevel degenerative changes.  No epidural abscess/fluid collection is seen.  There is a possible linear filling defect within the right common carotid artery (series 606/image 69, series 605/image 4), and although this may reflect artifact at the thoracic inlet, a focal dissection cannot be excluded.  IMPRESSION: Apparent linear filling defect within the right common carotid artery, possibly artifactual, although focal dissection cannot be excluded.  Consider CTA/MRA neck for further characterization as clinically warranted.  No evidence of fracture or dislocation.  No epidural abscess/fluid collection is seen.  Critical Value/emergent results were called by telephone at the time of interpretation on 09/07/2011  at 1810 hours  to  Dr Delford Field, who verbally acknowledged these results.  Original Report Authenticated By: Charline Bills, M.D.   Ct Thoracic Spine Wo Contrast  09/10/2011  *RADIOLOGY REPORT*  Clinical Data:  Leg weakness  CT THORACIC AND LUMBAR SPINE WITHOUT CONTRAST    IMPRESSION: No fracture  or dislocation is seen.  Very mild multilevel degenerative changes.  Original Report Authenticated By: Charline Bills, M.D.   Ct Lumbar Spine Wo Contrast  09/10/2011  *RADIOLOGY REPORT*  Clinical Data:  Leg weakness  CT THORACIC AND LUMBAR SPINE WITHOUT CONTRAST  Technique:  Multidetector CT imaging of the thoracic and lumbar spine was performed without contrast. Multiplanar CT image reconstructions were also generated.  Comparison:  None  CT THORACIC SPINE  Findings:  No fracture or dislocation is seen.  Mild multilevel degenerative changes of  the thoracic spine, most prominent at T11-12.  Lower cervical spine is incompletely imaged.  Left paracentral disc herniation at C5-6 (series 9/image 14).  Visualized thyroid is unremarkable.  Cardiomegaly.  Coronary atherosclerosis.  Valve annuloplasty.  Atherosclerotic calcifications of the aortic arch.  Pacemaker leads.  Evaluation of the lung parenchyma is constrained by respiratory motion.  Scattered patchy left upper lobe opacities. Mild right middle lobe opacity / scarring.  Small bilateral pleural effusions with associated lower lobe atelectasis.  IMPRESSION: No fracture or dislocation is seen.  Mild multilevel degenerative changes, most prominent at T11-12.  Left paracentral disc herniation at C5-6.  Evaluation of the lung parenchyma is constrained by respiratory motion.  Patchy left upper lobe and right middle lobe opacities. Small bilateral pleural effusions with associated lower lobe atelectasis.  CT LUMBAR SPINE  Findings: No fracture or dislocation is seen.  Very mild multilevel degenerative changes.  Atherosclerotic calcifications of the abdominal aorta and branch vessels.  Suspected calcified uterine fibroids.  IMPRESSION: No fracture or dislocation is seen.  Very mild multilevel degenerative changes.  Original Report Authenticated By: Charline Bills, M.D.   Dg Chest Port 1 View  09/17/2011  *RADIOLOGY REPORT*  Clinical Data: Respiratory failure, shortness of breath  PORTABLE CHEST - 1 VIEW  Comparison: 09/11/2011; 09/09/2011; 09/08/2011; chest CT - 09/10/2011  Findings:  Grossly unchanged enlarged cardiac silhouette and mediastinal contours post median sternotomy and valve replacement given decreased lung volumes.  Grossly unchanged positioning of single lead pacemaker with tip overlying the expected location of the right ventricle.  Decreased lung volumes with corresponding interval increase in right perihilar heterogeneous opacities. Cephalization of flow with thickening along the right  minor fissure.  No definite pleural effusion or pneumothorax.  Unchanged bones.  IMPRESSION: 1.  Decreased lung volumes with interval increase in right perihilar opacities to a represent atelectasis. 2.  Query mild pulmonary edema.  Original Report Authenticated By: Waynard Reeds, M.D.   Dg Chest Port 1 View  09/11/2011  *RADIOLOGY REPORT*  Clinical Data: Respiratory failure  PORTABLE CHEST - 1 VIEW  Comparison: 09/09/2011  Findings: Heart is moderately enlarged.  Single lead left subclavian pacemaker device unchanged.  Low volumes and hazy airspace opacities improved.  Stable mid lung right-sided linear opacity.  Normal pulmonary vascularity.  IMPRESSION: Improved aeration and airspace opacities.  Original Report Authenticated By: Donavan Burnet, M.D.   Dg Chest Port 1 View  09/09/2011  *RADIOLOGY REPORT*  Clinical Data: No edema, evaluate endotracheal tube position  PORTABLE CHEST - 1 VIEW  Comparison: Portable chest x-ray of 09/08/2011  Findings: No endotracheal tube is visible.  There is little change in poor aeration and basilar volume loss.  Cardiomegaly is stable. 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  IMPRESSION: No endotracheal tube.  No change in poor aeration with basilar volume loss.  Original Report Authenticated By: Juline Patch, M.D.   Dg Chest Port 1 View  09/08/2011  *RADIOLOGY  REPORT*  Clinical Data: Respiratory distress.  PORTABLE CHEST - 1 VIEW  Comparison: 09/06/2011  Findings: Cardiomegaly.  Mild vascular congestion.  Interstitial prominence could reflect interstitial edema.  This is increased slightly since prior study.  No effusions.  No acute bony abnormality.  IMPRESSION: Increasing interstitial prominence, question interstitial edema.  Original Report Authenticated By: Cyndie Chime, M.D.   Medications: Scheduled Meds:    . cefTRIAXone (ROCEPHIN)  IV  2 g Intravenous Q24H  . digoxin  0.125 mg Oral Daily  . famotidine  20 mg Oral BID  . lacosamide  100  mg Oral BID  . metoprolol tartrate  25 mg Oral BID  . warfarin  5 mg Oral ONCE-1800   Continuous Infusions:    . sodium chloride 20 mL/hr at 09/27/11 1245   PRN Meds:.sodium chloride, acetaminophen, diphenhydrAMINE, diphenhydrAMINE, naloxone, ondansetron (ZOFRAN) IV, polyethylene glycol, sodium chloride  Assessment/Plan:  Patient with complex hospitalization including:    Septicemia from strep pneumonia:  Picc line placed 1/8 for Rocephin 2g IV thru 10/25/11.   WBC rising   11.9 on 1/6.  14.0  On 1/8.  Discussed with Dr. Rito Ehrlich attending who would like to keep her one more day.   Will check urine, BNP, bmet. To eval.  Likely d/c to SNF 09/30/11.  Endocarditis of prostethic mitral valve.  S/P TEE 1/2.  SAH - resolved   Afib now rate controlled. with chronic anticoagulation theraputic on coumadin.  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

## 2011-09-29 NOTE — Progress Notes (Signed)
Clinical Social Worker received notification from MD that pt not yet ready for discharge today. Clinical Social Worker notified [facility] and pt husband. Clinical Social Worker to facilitate pt discharge needs when pt medically ready for discharge.  Jacklynn Lewis, MSW, LCSWA  Clinical Social Work [phone]

## 2011-09-29 NOTE — Progress Notes (Signed)
OT Cancellation Note  Treatment cancelled today due to patient receiving procedure or test. Pt. Is receiving PICC and has planned d/c to SNF today.  Carrie [name] 09/29/2011, 11:19 AM

## 2011-09-30 DIAGNOSIS — I482 Chronic atrial fibrillation, unspecified: Secondary | ICD-10-CM

## 2011-09-30 LAB — CBC
HCT: 31.6 % — ABNORMAL LOW (ref 36.0–46.0)
MCH: 29.4 pg (ref 26.0–34.0)
MCHC: 33.5 g/dL (ref 30.0–36.0)
MCV: 87.5 fL (ref 78.0–100.0)
RDW: 16.3 % — ABNORMAL HIGH (ref 11.5–15.5)

## 2011-09-30 LAB — BASIC METABOLIC PANEL
CO2: 23 mEq/L (ref 19–32)
Chloride: 103 mEq/L (ref 96–112)
Potassium: 3.6 mEq/L (ref 3.5–5.1)
Sodium: 137 mEq/L (ref 135–145)

## 2011-09-30 LAB — PROTIME-INR: INR: 3.57 — ABNORMAL HIGH (ref 0.00–1.49)

## 2011-09-30 MED ORDER — WARFARIN SODIUM 5 MG PO TABS: 2.5000 mg | ORAL_TABLET | Freq: Every day | ORAL | Status: DC

## 2011-09-30 MED ORDER — HEPARIN SOD (PORK) LOCK FLUSH 100 UNIT/ML IV SOLN
500.0000 [IU] | Freq: Once | INTRAVENOUS | Status: AC
  Administered 2011-09-30: 250 [IU] via INTRAVENOUS

## 2011-09-30 MED ORDER — WARFARIN SODIUM 2.5 MG PO TABS
2.5000 mg | ORAL_TABLET | Freq: Once | ORAL | Status: DC
  Filled 2011-09-30: qty 1

## 2011-09-30 NOTE — Progress Notes (Signed)
Patient discussed at the Long Length of Stay Lissy Deuser Weeks 09/30/2011  

## 2011-09-30 NOTE — Discharge Summary (Signed)
Agree with findings.  Pt to f/u with infectious disease and cardiology as indicated.  Also to be on Rocephin for the next four weeks.

## 2011-09-30 NOTE — Progress Notes (Signed)
Occupational Therapy Treatment Patient Details Name: Carrie [name] MRN: [id number] DOB: 1941/06/17 Today's Date: 09/30/2011  OT Assessment/Plan OT Assessment/Plan Comments on Treatment Session: Pt did very well during treatment.  Treatment ended early due to run of [facility]  OT Plan: Discharge plan remains appropriate OT Frequency: Min 1X/week Follow Up Recommendations: Skilled nursing facility Equipment Recommended: Defer to next venue OT Goals Acute Rehab OT Goals OT Goal Formulation: With patient Time For Goal Achievement: 2 weeks ADL Goals Pt Will Perform Grooming: Standing at sink;with supervision;Other (comment) ADL Goal: Grooming - Progress: Met Pt Will Perform Lower Body Dressing: with supervision;Sitting, bed;Sit to stand from bed ADL Goal: Lower Body Dressing - Progress: Partly met Pt Will Transfer to Toilet: with supervision;Regular height toilet ADL Goal: Toilet Transfer - Progress: Met Additional ADL Goal #1: Pt. will demonstrate sustained attention to simple ADL task x 5 mins. with no cues ADL Goal: Additional Goal #1 - Progress: Partly met  OT Treatment Precautions/Restrictions  Precautions Precautions: Fall Required Braces or Orthoses: No Restrictions Weight Bearing Restrictions: No   ADL ADL Grooming: Performed;Wash/dry hands;Teeth care;Supervision/safety Grooming Details (indicate cue type and reason): Pt stood x4 minutes with S.  No hands on assist needed. Where Assessed - Grooming: Standing at sink Lower Body Dressing: Performed;Supervision/safety Lower Body Dressing Details (indicate cue type and reason): pt donned pants and socks with no hands on assist. S when standing only for safety. Where Assessed - Lower Body Dressing: Sit to stand from chair Toilet Transfer: Performed;Supervision/safety Toilet Transfer Details (indicate cue type and reason): walked to toilet in BR with walker Toilet Transfer Method: Ambulating Toilet Transfer Equipment: Regular  height toilet Toileting - Clothing Manipulation: Performed;Supervision/safety Where Assessed - Glass blower/designer Manipulation: Standing Toileting - Hygiene: Performed;Supervision/safety Where Assessed - Toileting Hygiene: Sit to stand from 3-in-1 or toilet Equipment Used: Rolling walker Ambulation Related to ADLs: Pt safe ambulating with walker.  No hands on assist needed.  Pt did have run of vtach while walking.  Nurse requested for pt to sit. Mobility  Transfers Transfers: Yes Sit to Stand: 5: Supervision;Without upper extremity assist;With armrests;From chair/3-in-1 Sit to Stand Details (indicate cue type and reason): No hands on assist needed. Stand to Sit: 5: Supervision;To toilet Stand to Sit Details: Cues for safety and for positioning before sitting Exercises    End of Session OT - End of Session Activity Tolerance: Patient tolerated treatment well Patient left: in chair;with call bell in reach Nurse Communication: Mobility status for ambulation General Behavior During Session: [facility] for tasks performed Cognition: [facility] for tasks performed  Hope Budds [phone] 09/30/2011, 12:28 PM

## 2011-09-30 NOTE — Progress Notes (Signed)
Clinical Social Worker facilitated patient discharge needs including contacting the facility, family, and arranging ambulance transportation to [facility]. No further social work needs at this time.  Jacklynn Lewis, MSW, LCSWA  Clinical Social Work [phone]

## 2011-09-30 NOTE — Progress Notes (Signed)
ANTICOAGULATION CONSULT NOTE - Follow Up Consult  Pharmacy Consult for Coumadin Indication: h/o MVR  Allergies  Allergen Reactions  . Codeine Nausea And Vomiting    Patient Measurements: Height: 5\' 1"  (154.9 cm) Weight: 111 lb 5.3 oz (50.5 kg) IBW/kg (Calculated) : 47.8   Vital Signs: Temp: 98.1 F (36.7 C) (01/09 0530) BP: 136/74 mmHg (01/09 0530) Pulse Rate: 76  (01/09 0530)  Labs:  Basename 09/30/11 0558 09/29/11 1659 09/29/11 0638 09/28/11 0607  HGB 10.6* -- 10.3* --  HCT 31.6* -- 30.3* 30.0*  PLT 338 -- 333 295  APTT -- -- -- --  LABPROT 36.2* -- 34.1* 32.9*  INR 3.57* -- 3.31* 3.16*  HEPARINUNFRC -- -- -- --  CREATININE 0.69 0.71 -- --  CKTOTAL -- -- -- --  CKMB -- -- -- --  TROPONINI -- -- -- --   Estimated Creatinine Clearance: 50.1 ml/min (by C-G formula based on Cr of 0.69).   Medications:  Scheduled:    . cefTRIAXone (ROCEPHIN)  IV  2 g Intravenous Q24H  . digoxin  0.125 mg Oral Daily  . famotidine  20 mg Oral BID  . furosemide  20 mg Intravenous Q12H  . lacosamide  100 mg Oral BID  . metoprolol tartrate  25 mg Oral BID  . warfarin  2.5 mg Oral ONCE-1800    Assessment: 71 y/o female patient on chronic coumadin for h/o MVR. INR slightly supratherapeutic, trending up , no bleeding reported. Will give small dose today to prevent large drop tomorrow. PTA dose 5mg  daily.  Goal of Therapy:  INR=2.5-3.5   Plan:  Coumadin 2.5mg  today and f/u in am.  Verlene Mayer, PharmD, BCPS Pager [phone] 09/30/2011,9:30 AM

## 2011-09-30 NOTE — Progress Notes (Signed)
Subjective: Pt mentions that she feels well right now.  No acute issues overnight.  Pt denies any fever or chills.  Objective: Filed Vitals:   09/29/11 1135 09/29/11 1345 09/29/11 2057 09/30/11 0530  BP: 133/78 150/90 136/71 136/74  Pulse: 96 85 63 76  Temp:  99 F (37.2 C) 97.7 F (36.5 C) 98.1 F (36.7 C)  TempSrc:  Oral Oral   Resp:  20 19 20   Height:      Weight:      SpO2:  99% 95% 99%   Weight change:   Intake/Output Summary (Last 24 hours) at 09/30/11 1042 Last data filed at 09/30/11 0900  Gross per 24 hour  Intake    976 ml  Output   1450 ml  Net   -474 ml    General: Alert, awake, oriented x3, in no acute distress.  HEENT: No bruits, no goiter.  Heart: Regular rate and rhythm, without murmurs, rubs, gallops.  Lungs: CTA BL Abdomen: Soft, nontender, nondistended, positive bowel sounds.  Neuro: Grossly intact, nonfocal.   Lab Results:  [facility] 09/30/11 0558 09/29/11 1659  NA 137 132*  K 3.6 3.8  CL 103 100  CO2 23 20  GLUCOSE 116* 117*  BUN 14 15  CREATININE 0.69 0.71  CALCIUM 9.6 9.2  MG -- --  PHOS -- --    Basename 09/29/11 1659  AST 111*  ALT 69*  ALKPHOS 114  BILITOT 0.2*  PROT 8.6*  ALBUMIN 2.6*   No results found for this basename: LIPASE:2,AMYLASE:2 in the last 72 hours  Basename 09/30/11 0558 09/29/11 0638  WBC 11.8* 14.0*  NEUTROABS -- --  HGB 10.6* 10.3*  HCT 31.6* 30.3*  MCV 87.5 86.3  PLT 338 333   No results found for this basename: CKTOTAL:3,CKMB:3,CKMBINDEX:3,TROPONINI:3 in the last 72 hours No components found with this basename: POCBNP:3 No results found for this basename: DDIMER:2 in the last 72 hours No results found for this basename: HGBA1C:2 in the last 72 hours No results found for this basename: CHOL:2,HDL:2,LDLCALC:2,TRIG:2,CHOLHDL:2,LDLDIRECT:2 in the last 72 hours No results found for this basename: TSH,T4TOTAL,FREET3,T3FREE,THYROIDAB in the last 72 hours No results found for this basename:  VITAMINB12:2,FOLATE:2,FERRITIN:2,TIBC:2,IRON:2,RETICCTPCT:2 in the last 72 hours  Micro Results: No results found for this or any previous visit (from the past 240 hour(s)).  Studies/Results: Chest Portable 1 View Post Insertion To Confirm Placement As Interpreted By Radiologist  09/29/2011  *RADIOLOGY REPORT*  Clinical Data: PICC line placement.  PORTABLE CHEST - 1 VIEW  Comparison: 09/17/2011  Findings: Right PICC line is in place with the tip at the cavoatrial junction.  Left pacer is unchanged.  Prior median sternotomy and valve replacement.  There is cardiomegaly.  No confluent airspace opacities or effusions in the lungs.  No overt edema.  IMPRESSION: Right PICC line tip at the cavoatrial junction.  Original Report Authenticated By: Cyndie Chime, M.D.    Medications: I have reviewed the patient's current medications.   Patient Active Hospital Problem List: Encephalopathy acute (09/06/2011) Resolved was initially thought to be due to [facility] embolic from infected heart valve.  Currently patient is at baseline and answers all questions appropriately.    Atrial fibrillation with rapid ventricular response (09/06/2011) Pt is on digoxin and metoprolol and afib is currently rate controlled. 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  Recommendations are currently underway and patient is getting IV rocephin 2 gm q day.  Picc line is in place and patient is to get IV abx's daily for the next 4 weeks.   Subarachnoid hemorrhage, embolic secondary to endocaditis (09/08/2011) Resolved and patient is on IV rocephin.  Presence of permanent cardiac pacemaker (09/08/2011) Stable  Chronic anticoagulation, ( INR goal 2.5-3.5 under normal circumstances for [facility] Jude MVR) (09/09/2011) Pt's INR is 3.5  today.  Will need to keep INR goal as indicated above.  So will have to have coumadin adjusted and [facility]'s [facility] when patient goes.  Endocarditis of prosthetic valve (09/11/2011) As indicated above patient is to get prolongued IV abx and follow-up with ID office in 2 weeks post discharge from the hospital for further evaluation and management recommendation.     LOS: 24 days   Penny Pia M.D.  [facility] 09/30/2011, 10:42 AM

## 2011-09-30 NOTE — Progress Notes (Signed)
Physical Therapy Treatment Patient Details Name: Carrie [name] MRN: [id number] DOB: Jul 31, 1941 Today's Date: 09/30/2011  PT Assessment/Plan  PT - Assessment/Plan Comments on Treatment Session: Pt progressing well with ambulation today. She did not require any seated rest break and there was no evidence of SOB or increased fatique. Pt still slightly impulsive with decreased safety awareness. Pt increasing activity tolerance this session PT Plan: Discharge plan remains appropriate PT Frequency: Min 3X/week Follow Up Recommendations: Skilled nursing facility Equipment Recommended: Defer to next venue PT Goals  Acute Rehab PT Goals PT Goal: Sit to Stand - Progress: Progressing toward goal PT Goal: Ambulate - Progress: Progressing toward goal  PT Treatment Precautions/Restrictions  Precautions Precautions: Fall Precaution Comments: Secondary decreased cognition and weakness Required Braces or Orthoses: No Restrictions Weight Bearing Restrictions: No Mobility (including Balance) Transfers Sit to Stand: 5: Supervision;With upper extremity assist;From chair/3-in-1;With armrests;From toilet Stand to Sit: 5: Supervision;To toilet;With upper extremity assist;To chair/3-in-1;With armrests Stand to Sit Details: Cues for safety and for positioning before sitting Ambulation/Gait Ambulation/Gait Assistance: 4: Min assist Ambulation/Gait Assistance Details (indicate cue type and reason): A for safety and management of RW. Pt continued with narrow BOS with ambulation Ambulation Distance (Feet): 220 Feet (no rest break needed) Assistive device: Rolling walker Gait Pattern: Step-to pattern;Decreased stride length Gait velocity: slower cadence    Exercise    End of Session PT - End of Session Equipment Utilized During Treatment: Gait belt Activity Tolerance: Patient tolerated treatment well Patient left: in chair;with call bell in reach Nurse Communication: Mobility status for  transfers;Mobility status for ambulation General Behavior During Session: [facility] for tasks performed Cognition: [facility] for tasks performed  Alaia Lordi, Adline Potter 09/30/2011, 11:06 AM  09/30/2011 Fredrich Birks PTA [phone] pager [phone] office

## 2011-10-07 ENCOUNTER — Telehealth: Payer: Self-pay | Admitting: *Deleted

## 2011-10-07 NOTE — Telephone Encounter (Signed)
phone number has been disconnected called on 10-07-2011 at 4:47pm

## 2011-10-12 ENCOUNTER — Inpatient Hospital Stay: Payer: Medicare Other | Admitting: Infectious Disease

## 2011-10-14 ENCOUNTER — Inpatient Hospital Stay: Payer: Medicare Other | Admitting: Infectious Diseases

## 2011-10-22 ENCOUNTER — Telehealth: Payer: Self-pay | Admitting: *Deleted

## 2011-10-22 NOTE — Telephone Encounter (Signed)
Called RN Montez Morita to advise her of this info and had to leave a message.

## 2011-10-22 NOTE — Telephone Encounter (Signed)
Nurse who is taking care of the patient called Ronald Lobo [phone] she said that she is not sure what to do about the patient. Said she needs to know what to do about the D/C orders she has. The patient is to D/C the antibiotic on Sunday 10/25/11 and she is not due to come to the office until 10/28/11. Advised her will have to ask the provider what they want to do. After reviewing the patient chart found that she has not seen one of our doctor since she was in the hospital (2 missed appts). Went to supervisor and asked what to tell the nurse and was advised to have her stop antibiotics as scheduled and when leave the PICC in place and until she sees the doctor on 10/28/11 at which time a decision can be made on removal or continuation of the antibiotic. We will inform her of any changes at that time.

## 2011-10-28 ENCOUNTER — Encounter: Payer: Self-pay | Admitting: Internal Medicine

## 2011-10-28 ENCOUNTER — Encounter: Payer: Self-pay | Admitting: *Deleted

## 2011-10-28 ENCOUNTER — Ambulatory Visit (INDEPENDENT_AMBULATORY_CARE_PROVIDER_SITE_OTHER): Payer: Medicare Other | Admitting: Internal Medicine

## 2011-10-28 VITALS — BP 146/89 | HR 83 | Temp 97.9°F | Ht 61.0 in | Wt 133.5 lb

## 2011-10-28 DIAGNOSIS — T827XXA Infection and inflammatory reaction due to other cardiac and vascular devices, implants and grafts, initial encounter: Secondary | ICD-10-CM

## 2011-10-28 DIAGNOSIS — I38 Endocarditis, valve unspecified: Secondary | ICD-10-CM

## 2011-10-28 LAB — COMPREHENSIVE METABOLIC PANEL
Albumin: 3.6 g/dL (ref 3.5–5.2)
BUN: 12 mg/dL (ref 6–23)
CO2: 21 mEq/L (ref 19–32)
Calcium: 9.7 mg/dL (ref 8.4–10.5)
Chloride: 102 mEq/L (ref 96–112)
Creat: 0.63 mg/dL (ref 0.50–1.10)
Potassium: 4.1 mEq/L (ref 3.5–5.3)

## 2011-10-28 LAB — CBC
HCT: 33.7 % — ABNORMAL LOW (ref 36.0–46.0)
Hemoglobin: 10.9 g/dL — ABNORMAL LOW (ref 12.0–15.0)
MCV: 91.1 fL (ref 78.0–100.0)
RBC: 3.7 MIL/uL — ABNORMAL LOW (ref 3.87–5.11)
WBC: 9.4 10*3/uL (ref 4.0–10.5)

## 2011-10-28 LAB — C-REACTIVE PROTEIN: CRP: 3.69 mg/dL — ABNORMAL HIGH (ref <0.60)

## 2011-10-28 NOTE — Progress Notes (Signed)
Per Dr. Orvan Falconer labs drawn from PICC.  Connector cleansed with chlorhexidine and allowed to dry. Labs drawn according to orders in  chart.  After labs were completed PICC was removed per physician order.  PICC site clean and dry . No sutures attached so dressing was  removed . Area around PICC site unremarkable.  34 cm PICC lumen removed from patient's right arm.  Petroleum gauge applied to insertion site with dressing applied.  Pt advised no heavy lifting and leave dressing in place for 24 hours.  Monitor dressing for bleeding. If bleeding occurs she is to contact our office. Pt tolerated procedure well.   Laurell Josephs, RN, BSN

## 2011-10-28 NOTE — Progress Notes (Signed)
Patient ID: Carrie [name], female   DOB: 08/02/1941, 71 y.o.   MRN: [id number]  INFECTIOUS DISEASE PROGRESS NOTE    Patient Active Problem List  Diagnoses  . CONSTIPATION  . S/P mitral valve replacement, St Jude  . Pneumococcal septicemia  . Muscle weakness of lower extremity  . Presence of permanent cardiac pacemaker  . Chronic anticoagulation, ( INR goal 2.5-3.5 under normal circumstances for ST Jude MVR)  . Endocarditis of prosthetic valve  . A-fib     Subjective: Carrie [name] is in with her husband for her hospital followup visit. I saw her in December when she was hospitalized at [facility] with fever and acute confusion. 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 There was also concern initially that she might have even had meningitis. She improved with ceftriaxone and was eventually discharged to [facility] on January 9. She was discharged from there back to home on January 30. She completed a total of 50 days of antibiotic therapy on February 3.  She does not have any recall of her hospitalization or the early part of time after her discharge. She has had some episodes of night sweats recently that she recalls is uncertain if she was having them at the nursing home or in the hospital. She is not aware of having had any fever or recent chills. She has no headache. She has some mild migratory back pain. Her husband thinks she suffered a fall a few weeks before she was hospitalized in may of injured her back. CT scan of her back during her hospitalization did not show any evidence of vertebral infection.  Objective:   General: She is alert and in no distress Skin: Her right arm PICC site appears normal. She has no splinter or conjunctival hemorrhages Lungs: Clear Cor: She has a regular S1 and S2 with occasional premature beats. There is an early 2/6  systolic murmur heard best at the left sternal border. Her left upper chest pacemaker site appears normal Abdomen: Soft and nontender There is no tenderness with percussion over her back  Lab Results Lab Results  Component Value Date   WBC 11.8* 09/30/2011   HGB 10.6* 09/30/2011   HCT 31.6* 09/30/2011   MCV 87.5 09/30/2011   PLT 338 09/30/2011    Lab Results  Component Value Date   CREATININE 0.69 09/30/2011   BUN 14 09/30/2011   NA 137 09/30/2011   K 3.6 09/30/2011   CL 103 09/30/2011   CO2 23 09/30/2011    Lab Results  Component Value Date   ALT 69* 09/29/2011   AST 111* 09/29/2011   ALKPHOS 114 09/29/2011   BILITOT 0.2* 09/29/2011    No results found for this basename: CRP    No results found for this basename: ESRSEDRATE, SEDRATE, POCTSEDRATE    Assessment: She's had a lengthy course of therapy for pneumococcal bacteremia, endocarditis and possible meningitis. With the exception of some possible night sweats and she is improving. I will keep her off of antibiotics for now and have the PICC pulled. I've asked her to take her temperature each evening before going to bed and if she is having sweats and call me immediately if she's having any temperatures of 101 or greater. She her husband also noted call me if they have any other concerns about possible early relapse. I will plan on seeing her back  within one month for repeat blood cultures and would consider repeat transthoracic echocardiogram in the coming months. I would not recommend committing to long-term suppressive antibiotic therapy at this point given the significant risks associated with antibiotic complications.  Plan: 1. Continue observation off of antibiotics 2. Remove PICC 3. Check CBC, complete metabolic panel, sedimentation rate and C-reactive protein 4. Monitor temperature at home 5. Return to clinic in one month   Cliffton Asters, MD [facility] for Infectious Diseases [facility] [phone] pager   [phone]  cell 10/28/2011, 2:30 PM

## 2011-11-10 ENCOUNTER — Other Ambulatory Visit: Payer: Medicare Other | Admitting: Lab

## 2011-11-16 ENCOUNTER — Ambulatory Visit: Payer: Medicare Other | Admitting: Oncology

## 2011-11-19 DIAGNOSIS — I051 Rheumatic mitral insufficiency: Secondary | ICD-10-CM

## 2011-11-19 HISTORY — PX: US ECHOCARDIOGRAPHY: HXRAD669

## 2011-11-19 HISTORY — DX: Rheumatic mitral insufficiency: I05.1

## 2011-11-26 ENCOUNTER — Encounter: Payer: Self-pay | Admitting: Internal Medicine

## 2011-11-26 ENCOUNTER — Ambulatory Visit (INDEPENDENT_AMBULATORY_CARE_PROVIDER_SITE_OTHER): Payer: Medicare Other | Admitting: Internal Medicine

## 2011-11-26 VITALS — BP 168/88 | HR 83 | Temp 97.9°F | Ht 61.0 in | Wt 131.5 lb

## 2011-11-26 DIAGNOSIS — T827XXA Infection and inflammatory reaction due to other cardiac and vascular devices, implants and grafts, initial encounter: Secondary | ICD-10-CM

## 2011-11-26 DIAGNOSIS — I38 Endocarditis, valve unspecified: Secondary | ICD-10-CM

## 2011-11-26 NOTE — Progress Notes (Signed)
Patient ID: Carrie [name], female   DOB: 16-Jan-1941, 71 y.o.   MRN: [id number]  INFECTIOUS DISEASE PROGRESS NOTE    Subjective: Carrie [name] is in for her routine followup visit with her husband. 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 She states that she has done well since completing therapy. She had one episode 4 days ago that she describes as a hot flash. Her husband took her temperature and it was 98.3. Neither of them believe that she's had any fevers since her last visit. Her appetite has improved and she is feeling better. She tells me that Dr. Clarene Duke, her cardiologist, repeated her transthoracic echocardiogram and told her that there was still a small vegetation on her mitral valve but that it was smaller.  Objective: Temp: 97.9 F (36.6 C) (03/07 1105) Temp src: Oral (03/07 1105) BP: 168/88 mmHg (03/07 1105) Pulse Rate: 83  (03/07 1105)  General: She is in good spirits Skin: No rash, splinter or conjunctival hemorrhages Lungs: Clear Cor: Irregularly irregular S1 and S2 with a stable 2/6 systolic murmur heard best at the right upper sternal border. Her left upper chest pacemaker site appears normal.  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 I've asked her husband to call me if she has any true fevers, chills, sweats or other signs or symptoms of recurrent infection between now and her followup visit in 6 weeks.  Plan: 1. Observe off of antibiotics 2. Repeat blood cultures today 3. Return to clinic in 6 weeks   Cliffton Asters, MD [facility] for Infectious Diseases [facility] [phone] pager   [phone] cell 11/26/2011, 5:07 PM

## 2011-12-01 ENCOUNTER — Encounter: Payer: Self-pay | Admitting: Oncology

## 2011-12-01 ENCOUNTER — Other Ambulatory Visit (HOSPITAL_BASED_OUTPATIENT_CLINIC_OR_DEPARTMENT_OTHER): Payer: Medicare Other | Admitting: Lab

## 2011-12-01 ENCOUNTER — Ambulatory Visit (HOSPITAL_BASED_OUTPATIENT_CLINIC_OR_DEPARTMENT_OTHER): Payer: Medicare Other | Admitting: Oncology

## 2011-12-01 ENCOUNTER — Telehealth: Payer: Self-pay | Admitting: *Deleted

## 2011-12-01 VITALS — BP 160/82 | HR 99 | Temp 98.2°F | Ht 61.0 in | Wt 131.3 lb

## 2011-12-01 DIAGNOSIS — IMO0002 Reserved for concepts with insufficient information to code with codable children: Secondary | ICD-10-CM

## 2011-12-01 DIAGNOSIS — D596 Hemoglobinuria due to hemolysis from other external causes: Secondary | ICD-10-CM

## 2011-12-01 DIAGNOSIS — D892 Hypergammaglobulinemia, unspecified: Secondary | ICD-10-CM

## 2011-12-01 DIAGNOSIS — I059 Rheumatic mitral valve disease, unspecified: Secondary | ICD-10-CM

## 2011-12-01 LAB — CBC WITH DIFFERENTIAL/PLATELET
Eosinophils Absolute: 0.1 10*3/uL (ref 0.0–0.5)
HCT: 38 % (ref 34.8–46.6)
LYMPH%: 20.9 % (ref 14.0–49.7)
MCHC: 33.2 g/dL (ref 31.5–36.0)
MCV: 88.4 fL (ref 79.5–101.0)
MONO#: 0.4 10*3/uL (ref 0.1–0.9)
MONO%: 5.5 % (ref 0.0–14.0)
NEUT#: 4.9 10*3/uL (ref 1.5–6.5)
NEUT%: 71.2 % (ref 38.4–76.8)
Platelets: 225 10*3/uL (ref 145–400)
RBC: 4.3 10*6/uL (ref 3.70–5.45)
nRBC: 0 % (ref 0–0)

## 2011-12-01 LAB — COMPREHENSIVE METABOLIC PANEL
ALT: 12 U/L (ref 0–35)
BUN: 16 mg/dL (ref 6–23)
CO2: 22 mEq/L (ref 19–32)
Calcium: 9.5 mg/dL (ref 8.4–10.5)
Chloride: 104 mEq/L (ref 96–112)
Creatinine, Ser: 0.8 mg/dL (ref 0.50–1.10)
Total Bilirubin: 0.4 mg/dL (ref 0.3–1.2)

## 2011-12-01 LAB — LACTATE DEHYDROGENASE: LDH: 315 U/L — ABNORMAL HIGH (ref 94–250)

## 2011-12-01 LAB — IRON AND TIBC
%SAT: 20 % (ref 20–55)
Iron: 70 ug/dL (ref 42–145)
TIBC: 346 ug/dL (ref 250–470)
UIBC: 276 ug/dL (ref 125–400)

## 2011-12-01 NOTE — Patient Instructions (Signed)
1. You blood work looks good.  2. I will see you in 1 year

## 2011-12-01 NOTE — Progress Notes (Signed)
OFFICE PROGRESS NOTE  CC  Geraldo Pitter, MD, MD 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 female returns for followup visit today. She has been seen by me on a yearly basis. Patient tells the most recently she was hospitalized do to her mitral valve endocarditis. She now is doing well. She otherwise denies any fevers chills night sweats headaches currently she has no shortness of breath no chest pains palpitations no myalgias or arthralgias no bleeding problems. Remainder of the 10 point review of systems is negative.  MEDICAL HISTORY: Past Medical History  Diagnosis Date  . Hypertension   . Atrial fibrillation     normal coroonaries cath 2004. Dr Clarene Duke [facility]   . Sick sinus syndrome     Dr Sharrell Ku. EP study negative for inducibel arrythmia. ? pacer since 2007  . Hyperlipidemia   . Arthritis   . Anxiety   . Diverticula of colon   . Pneumonia 2009    resolved.? OPD Rx    ALLERGIES:  is allergic to codeine.  MEDICATIONS:  Current Outpatient Prescriptions  Medication Sig Dispense Refill  . digoxin (LANOXIN) 0.125 MG tablet Take 1 tablet (0.125 mg total) by mouth daily.  30 tablet  0  . famotidine (PEPCID) 20 MG tablet Take 20 mg by mouth 2 (two) times daily.        Marland Kitchen lacosamide 100 MG TABS Take 1 tablet (100 mg total) by mouth 2 (two) times daily.  60 tablet  0  . metoprolol tartrate (LOPRESSOR) 25 MG tablet Take 1.5 tablets (37.5 mg total) by mouth 2 (two) times daily.  45 tablet  0  . warfarin (COUMADIN) 5 MG tablet Take 2.5 mg by mouth daily. 2.5 mg on Monday and 1 mg daily on other days        SURGICAL HISTORY:  Past Surgical History  Procedure Date  . Pacemaker insertion   . Mitral valve replacement 1998    St Jude  .  Appendectomy   . Tubal ligation   . Tee without cardioversion 09/23/2011    Procedure: TRANSESOPHAGEAL ECHOCARDIOGRAM (TEE);  Surgeon: Chrystie Nose;  Location: [facility] ENDOSCOPY;  Service: Cardiovascular;  Laterality: N/A;    REVIEW OF SYSTEMS:  Pertinent items are noted in HPI.   PHYSICAL EXAMINATION: Resp: clear to auscultation bilaterally and normal percussion bilaterally Cardio: irregularly irregular rhythm GI: soft, non-tender; bowel sounds normal; no masses,  no organomegaly Extremities: extremities normal, atraumatic, no cyanosis or edema Neurologic: Alert and oriented X 3, normal strength and tone. Normal symmetric reflexes. Normal coordination and gait  ECOG PERFORMANCE STATUS: 1 - Symptomatic but completely ambulatory  Blood pressure 160/82, pulse 99, temperature 98.2 F (36.8 C), temperature source Oral, height 5\' 1"  (1.549 m), weight 131 lb 4.8 oz (59.557 kg).  LABORATORY DATA: Lab Results  Component Value Date   WBC 6.9 12/01/2011   HGB 12.6 12/01/2011   HCT 38.0 12/01/2011   MCV 88.4 12/01/2011   PLT 225 12/01/2011      Chemistry      Component Value Date/Time   NA 137 12/01/2011 0832   NA 138 03/14/2010 1318   K 3.8 12/01/2011 0832   K 4.1 03/14/2010 1318   CL 104 12/01/2011 0832   CL 100 03/14/2010 1318   CO2 22 12/01/2011 4696  CO2 27 03/14/2010 1318   BUN 16 12/01/2011 0832   BUN 14 03/14/2010 1318   CREATININE 0.80 12/01/2011 0832   CREATININE 0.63 10/28/2011 1453      Component Value Date/Time   CALCIUM 9.5 12/01/2011 0832   CALCIUM 9.9 03/14/2010 1318   ALKPHOS 115 12/01/2011 0832   ALKPHOS 125* 03/14/2010 1318   AST 38* 12/01/2011 0832   AST 46* 03/14/2010 1318   ALT 12 12/01/2011 0832   BILITOT 0.4 12/01/2011 0832   BILITOT 0.70 03/14/2010 1318       RADIOGRAPHIC STUDIES:  No results found.  ASSESSMENT: 71 year old female with  #1 polyclonal tomography.  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  #3 recent hospitalization for bacterial  endocarditis   PLAN:   #1 hematological patient seems to be doing well her blood count is reviewed today he looks pretty stable she has no evidence of overt hemolysis going on at this point.  #2 I have recommended that patients continue to follow [place] on a yearly basis. Of course I can also see her on an as needed basis.  #3 her appointment is tentatively set up to be seen by me in one years time.   All questions were answered. The patient knows to call the clinic with any problems, questions or concerns. We can certainly see the patient much sooner if necessary.  I spent 20 minutes counseling the patient face to face. The total time spent in the appointment was 30 minutes.    Drue Second, MD Medical/Oncology [facility] [phone] (beeper) [phone] (Office)  12/01/2011, 1:45 PM

## 2011-12-01 NOTE — Telephone Encounter (Signed)
gave patient appointment for 11-2012 printed out calendar and gave to the patient 

## 2011-12-02 LAB — CULTURE, BLOOD (SINGLE)
Organism ID, Bacteria: NO GROWTH
Organism ID, Bacteria: NO GROWTH

## 2012-01-07 ENCOUNTER — Telehealth: Payer: Self-pay | Admitting: *Deleted

## 2012-01-07 ENCOUNTER — Ambulatory Visit (INDEPENDENT_AMBULATORY_CARE_PROVIDER_SITE_OTHER): Payer: Medicare Other | Admitting: Internal Medicine

## 2012-01-07 ENCOUNTER — Encounter: Payer: Self-pay | Admitting: Internal Medicine

## 2012-01-07 VITALS — BP 145/91 | HR 98 | Ht 61.0 in | Wt 131.5 lb

## 2012-01-07 DIAGNOSIS — T827XXA Infection and inflammatory reaction due to other cardiac and vascular devices, implants and grafts, initial encounter: Secondary | ICD-10-CM

## 2012-01-07 DIAGNOSIS — T826XXA Infection and inflammatory reaction due to cardiac valve prosthesis, initial encounter: Secondary | ICD-10-CM

## 2012-01-07 NOTE — Telephone Encounter (Signed)
States she takes Vimpat 100mg  . 2 daily for seizures.  He is trying to get her off that drug & has rx'd levetiracetam 250mg  bid. Wanted to know more about tapering. I asked that she call Dr. Anne Hahn for this info. She agreed to do this

## 2012-01-07 NOTE — Progress Notes (Signed)
Patient ID: Carrie [name], female   DOB: 09-27-1940, 71 y.o.   MRN: [id number]  INFECTIOUS DISEASE PROGRESS NOTE    Subjective: This is Carrie [name] is in for her routine followup visit. She has now been off of all antibiotics for about 2 and half months and continues to do well. She has not had any fever, chills, or sweats.  Objective: BP: 145/91 mmHg (04/18 1407) Pulse Rate: 98  (04/18 1407)  General: She appears well and in no distress. Her weight is unchanged at 131 pounds. 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 Jude's mitral valve prosthesis appears to have been cured.  Plan: 1. Continue observation off of antibiotics 2. Followup here as needed   Cliffton Asters, MD [facility] for Infectious Diseases [facility] [phone] pager   [phone] cell 01/07/2012, 2:50 PM

## 2012-01-07 NOTE — Telephone Encounter (Signed)
0

## 2012-03-16 ENCOUNTER — Encounter (HOSPITAL_COMMUNITY): Payer: Self-pay | Admitting: Emergency Medicine

## 2012-03-16 ENCOUNTER — Inpatient Hospital Stay (HOSPITAL_COMMUNITY)
Admission: EM | Admit: 2012-03-16 | Discharge: 2012-03-29 | DRG: 982 | Disposition: A | Discharge status: Home / Self Care | Payer: Medicare Other | Attending: General Surgery | Admitting: General Surgery

## 2012-03-16 DIAGNOSIS — Z952 Presence of prosthetic heart valve: Secondary | ICD-10-CM

## 2012-03-16 DIAGNOSIS — R58 Hemorrhage, not elsewhere classified: Secondary | ICD-10-CM

## 2012-03-16 DIAGNOSIS — Z954 Presence of other heart-valve replacement: Secondary | ICD-10-CM

## 2012-03-16 DIAGNOSIS — I482 Chronic atrial fibrillation, unspecified: Secondary | ICD-10-CM | POA: Diagnosis present

## 2012-03-16 DIAGNOSIS — I4891 Unspecified atrial fibrillation: Secondary | ICD-10-CM | POA: Diagnosis present

## 2012-03-16 DIAGNOSIS — E871 Hypo-osmolality and hyponatremia: Secondary | ICD-10-CM | POA: Diagnosis present

## 2012-03-16 DIAGNOSIS — K7689 Other specified diseases of liver: Principal | ICD-10-CM | POA: Diagnosis present

## 2012-03-16 DIAGNOSIS — Z950 Presence of cardiac pacemaker: Secondary | ICD-10-CM | POA: Diagnosis present

## 2012-03-16 DIAGNOSIS — I38 Endocarditis, valve unspecified: Secondary | ICD-10-CM | POA: Diagnosis present

## 2012-03-16 DIAGNOSIS — E785 Hyperlipidemia, unspecified: Secondary | ICD-10-CM | POA: Diagnosis present

## 2012-03-16 DIAGNOSIS — D62 Acute posthemorrhagic anemia: Secondary | ICD-10-CM | POA: Diagnosis not present

## 2012-03-16 DIAGNOSIS — T826XXA Infection and inflammatory reaction due to cardiac valve prosthesis, initial encounter: Secondary | ICD-10-CM

## 2012-03-16 DIAGNOSIS — N289 Disorder of kidney and ureter, unspecified: Secondary | ICD-10-CM | POA: Diagnosis not present

## 2012-03-16 DIAGNOSIS — I609 Nontraumatic subarachnoid hemorrhage, unspecified: Secondary | ICD-10-CM | POA: Diagnosis not present

## 2012-03-16 DIAGNOSIS — Z7901 Long term (current) use of anticoagulants: Secondary | ICD-10-CM

## 2012-03-16 DIAGNOSIS — R112 Nausea with vomiting, unspecified: Secondary | ICD-10-CM | POA: Diagnosis present

## 2012-03-16 DIAGNOSIS — E876 Hypokalemia: Secondary | ICD-10-CM | POA: Diagnosis not present

## 2012-03-16 DIAGNOSIS — T45515A Adverse effect of anticoagulants, initial encounter: Secondary | ICD-10-CM | POA: Diagnosis present

## 2012-03-16 HISTORY — DX: Hemorrhage, not elsewhere classified: R58

## 2012-03-16 LAB — GLUCOSE, CAPILLARY: Glucose-Capillary: 184 mg/dL — ABNORMAL HIGH (ref 70–99)

## 2012-03-16 NOTE — ED Notes (Signed)
Pt alert, arrives from home, c/o generalized abd pain, emesis pta, seen MD today, "treated for real sick in March", recent Hospital stay, resp even unlabored, skin moist

## 2012-03-16 NOTE — ED Notes (Signed)
Upon arrival of patient to room, pt noted to be pale and diaphoretic, pt assisted into bed and placed on cardiac monitor, BP 79/41 , IV placed, MD notified of patient status. Pt placed in trendelenburg. Pt remains cool and pale, BP improved, IV fluids infusing.

## 2012-03-17 ENCOUNTER — Emergency Department (HOSPITAL_COMMUNITY): Payer: Medicare Other

## 2012-03-17 ENCOUNTER — Encounter (HOSPITAL_COMMUNITY): Payer: Self-pay | Admitting: General Surgery

## 2012-03-17 ENCOUNTER — Inpatient Hospital Stay (HOSPITAL_COMMUNITY): Payer: Medicare Other

## 2012-03-17 DIAGNOSIS — Z7901 Long term (current) use of anticoagulants: Secondary | ICD-10-CM

## 2012-03-17 DIAGNOSIS — K7689 Other specified diseases of liver: Secondary | ICD-10-CM

## 2012-03-17 DIAGNOSIS — R58 Hemorrhage, not elsewhere classified: Secondary | ICD-10-CM

## 2012-03-17 HISTORY — DX: Hemorrhage, not elsewhere classified: R58

## 2012-03-17 LAB — COMPREHENSIVE METABOLIC PANEL
BUN: 17 mg/dL (ref 6–23)
CO2: 19 mEq/L (ref 19–32)
Chloride: 103 mEq/L (ref 96–112)
Creatinine, Ser: 0.95 mg/dL (ref 0.50–1.10)
GFR calc non Af Amer: 59 mL/min — ABNORMAL LOW (ref >90)
Total Bilirubin: 0.5 mg/dL (ref 0.3–1.2)

## 2012-03-17 LAB — CBC
Hemoglobin: 5.7 g/dL — CL (ref 12.0–15.0)
MCH: 29.5 pg (ref 26.0–34.0)
MCH: 29.2 pg (ref 26.0–34.0)
MCHC: 33.1 g/dL (ref 30.0–36.0)
MCV: 86.4 fL (ref 78.0–100.0)
Platelets: 170 10*3/uL (ref 150–400)
RBC: 2.64 MIL/uL — ABNORMAL LOW (ref 3.87–5.11)
RDW: 18.5 % — ABNORMAL HIGH (ref 11.5–15.5)
RDW: 17.2 % — ABNORMAL HIGH (ref 11.5–15.5)
WBC: 14.1 10*3/uL — ABNORMAL HIGH (ref 4.0–10.5)

## 2012-03-17 LAB — PROTIME-INR
INR: 1.33 (ref 0.00–1.49)
INR: 1.33 (ref 0.00–1.49)
Prothrombin Time: 16.7 seconds — ABNORMAL HIGH (ref 11.6–15.2)
Prothrombin Time: 16.7 seconds — ABNORMAL HIGH (ref 11.6–15.2)
Prothrombin Time: 29.5 seconds — ABNORMAL HIGH (ref 11.6–15.2)

## 2012-03-17 LAB — URINALYSIS, ROUTINE W REFLEX MICROSCOPIC
Bilirubin Urine: NEGATIVE
Glucose, UA: NEGATIVE mg/dL
Hgb urine dipstick: NEGATIVE
Specific Gravity, Urine: 1.018 (ref 1.005–1.030)
pH: 6 (ref 5.0–8.0)

## 2012-03-17 LAB — CBC WITH DIFFERENTIAL/PLATELET
Basophils Absolute: 0 10*3/uL (ref 0.0–0.1)
HCT: 25.4 % — ABNORMAL LOW (ref 36.0–46.0)
Lymphs Abs: 2.3 10*3/uL (ref 0.7–4.0)
MCH: 28.8 pg (ref 26.0–34.0)
MCV: 89.1 fL (ref 78.0–100.0)
Monocytes Relative: 6 % (ref 3–12)
Neutro Abs: 17 10*3/uL — ABNORMAL HIGH (ref 1.7–7.7)
RDW: 18.6 % — ABNORMAL HIGH (ref 11.5–15.5)
WBC: 20.5 10*3/uL — ABNORMAL HIGH (ref 4.0–10.5)

## 2012-03-17 LAB — URINE MICROSCOPIC-ADD ON

## 2012-03-17 LAB — LIPASE, BLOOD: Lipase: 67 U/L — ABNORMAL HIGH (ref 11–59)

## 2012-03-17 LAB — MRSA PCR SCREENING: MRSA by PCR: NEGATIVE

## 2012-03-17 MED ORDER — MORPHINE SULFATE 2 MG/ML IJ SOLN: 2.0000 mg | INTRAMUSCULAR | Status: DC | PRN

## 2012-03-17 MED ORDER — FENTANYL CITRATE 0.05 MG/ML IJ SOLN
INTRAMUSCULAR | Status: AC
  Filled 2012-03-17: qty 4

## 2012-03-17 MED ORDER — VITAMIN K1 10 MG/ML IJ SOLN
10.0000 mg | INTRAVENOUS | Status: AC
  Administered 2012-03-17: 10 mg via INTRAVENOUS
  Filled 2012-03-17: qty 1

## 2012-03-17 MED ORDER — LIDOCAINE HCL 1 % IJ SOLN
INTRAMUSCULAR | Status: AC
  Filled 2012-03-17: qty 40

## 2012-03-17 MED ORDER — PANTOPRAZOLE SODIUM 40 MG IV SOLR
40.0000 mg | Freq: Every day | INTRAVENOUS | Status: DC
  Administered 2012-03-17 – 2012-03-23 (×7): 40 mg via INTRAVENOUS
  Filled 2012-03-17 (×8): qty 40

## 2012-03-17 MED ORDER — FENTANYL CITRATE 0.05 MG/ML IJ SOLN
INTRAMUSCULAR | Status: AC | PRN
  Administered 2012-03-17 (×2): 50 ug via INTRAVENOUS

## 2012-03-17 MED ORDER — ANTIINHIBITOR COAGULANT CMPLX IV SOLR
500.0000 [IU] | Freq: Once | INTRAVENOUS | Status: DC | PRN
  Filled 2012-03-17: qty 500

## 2012-03-17 MED ORDER — MORPHINE SULFATE 4 MG/ML IJ SOLN
4.0000 mg | Freq: Once | INTRAMUSCULAR | Status: AC
  Administered 2012-03-17: 4 mg via INTRAVENOUS
  Filled 2012-03-17: qty 1

## 2012-03-17 MED ORDER — ANTIINHIBITOR COAGULANT CMPLX IV SOLR
500.0000 [IU] | INTRAVENOUS | Status: AC
  Administered 2012-03-17: 500 [IU] via INTRAVENOUS
  Filled 2012-03-17: qty 500

## 2012-03-17 MED ORDER — CEFAZOLIN SODIUM 1-5 GM-% IV SOLN
INTRAVENOUS | Status: AC
  Filled 2012-03-17: qty 50

## 2012-03-17 MED ORDER — CEFAZOLIN SODIUM 1-5 GM-% IV SOLN
1.0000 g | Freq: Once | INTRAVENOUS | Status: AC
  Administered 2012-03-17: 1 g via INTRAVENOUS
  Filled 2012-03-17: qty 50

## 2012-03-17 MED ORDER — KCL IN DEXTROSE-NACL 20-5-0.9 MEQ/L-%-% IV SOLN
INTRAVENOUS | Status: DC
  Administered 2012-03-17: 1000 mL via INTRAVENOUS
  Administered 2012-03-18 (×2): via INTRAVENOUS
  Administered 2012-03-18: 50 mL/h via INTRAVENOUS
  Administered 2012-03-19: 13:00:00 via INTRAVENOUS
  Administered 2012-03-20: 50 mL via INTRAVENOUS
  Administered 2012-03-21 – 2012-03-27 (×7): via INTRAVENOUS
  Administered 2012-03-28: 50 mL/h via INTRAVENOUS
  Administered 2012-03-29: 05:00:00 via INTRAVENOUS
  Filled 2012-03-17 (×21): qty 1000

## 2012-03-17 MED ORDER — FENTANYL CITRATE 0.05 MG/ML IJ SOLN
INTRAMUSCULAR | Status: AC
  Filled 2012-03-17: qty 2

## 2012-03-17 MED ORDER — HEPARIN (PORCINE) IN NACL 100-0.45 UNIT/ML-% IJ SOLN
900.0000 [IU]/h | INTRAMUSCULAR | Status: DC
  Administered 2012-03-17 – 2012-03-20 (×3): 900 [IU]/h via INTRAVENOUS
  Filled 2012-03-17 (×4): qty 250

## 2012-03-17 MED ORDER — ONDANSETRON HCL 4 MG/2ML IJ SOLN
4.0000 mg | Freq: Once | INTRAMUSCULAR | Status: AC
  Administered 2012-03-17: 4 mg via INTRAVENOUS
  Filled 2012-03-17: qty 2

## 2012-03-17 MED ORDER — ONDANSETRON HCL 4 MG/2ML IJ SOLN: 4.0000 mg | Freq: Four times a day (QID) | INTRAMUSCULAR | Status: DC | PRN

## 2012-03-17 MED ORDER — SODIUM CHLORIDE 0.9 % IV SOLN
INTRAVENOUS | Status: DC
  Administered 2012-03-17: 13:00:00 via INTRAVENOUS

## 2012-03-17 MED ORDER — IOHEXOL 300 MG/ML  SOLN
200.0000 mL | Freq: Once | INTRAMUSCULAR | Status: AC | PRN
  Administered 2012-03-17: 61 mL via INTRA_ARTERIAL

## 2012-03-17 MED ORDER — MIDAZOLAM HCL 2 MG/2ML IJ SOLN
INTRAMUSCULAR | Status: AC
  Filled 2012-03-17: qty 6

## 2012-03-17 MED ORDER — MIDAZOLAM HCL 5 MG/5ML IJ SOLN
INTRAMUSCULAR | Status: AC | PRN
  Administered 2012-03-17 (×2): 1 mg via INTRAVENOUS

## 2012-03-17 MED ORDER — IOHEXOL 300 MG/ML  SOLN
100.0000 mL | Freq: Once | INTRAMUSCULAR | Status: AC | PRN
  Administered 2012-03-17: 100 mL via INTRAVENOUS

## 2012-03-17 NOTE — ED Notes (Signed)
IV team at bedside 

## 2012-03-17 NOTE — ED Notes (Signed)
IV team paged to start second line.

## 2012-03-17 NOTE — Progress Notes (Signed)
Utilization review completed.  

## 2012-03-17 NOTE — Consult Note (Signed)
Name: Carrie [name] MRN: [id number] DOB: November 21, 1940    LOS: 1  Referring Provider:  [facility] Reason for Referral:  Medical Mgmt ICU  PULMONARY / CRITICAL CARE MEDICINE  HPI:  [name] with PMH of HTN, Sick sinus syndrome, Anxiety, HLD, Afib, MVR on coumadin presented to [facility] ED 6/26 with a 3 week history of abdominal discomfort that she related to be reflux.  She had been seen by her primary care physician and treated with Zegerid. Only 2 weeks ago she had an episode of nausea and vomiting associated with abdominal pain that resolved after vomiting. Last p.m. she had dinner and then began having abrupt onset abdominal pain with associated nausea. Pain did not relieve and she presented to the emergency room at which time a CT demonstrated a contrast collection in the left lobe of the liver suggestive of active bleeding.  She denied abdominal injury, falls or other related incident that might cause bleeding.  In March of 2013 her hemoglobin was 12.6 and upon admission was noted to be 8.2. She was hemodynamically stable and admitted to the intensive care unit. PCCM consulted for ICU medical management.  Past Medical History  Diagnosis Date  . Hypertension   . Atrial fibrillation     normal coroonaries cath 2004. Dr Clarene [facility]   . Sick sinus syndrome     Dr Sharrell Ku. EP study negative for inducibel arrythmia. ? pacer since 2007  . Hyperlipidemia   . Arthritis   . Anxiety   . Diverticula of colon   . Pneumonia 2009    resolved.? OPD Rx   Past Surgical History  Procedure Date  . Pacemaker insertion   . Mitral valve replacement 1998    St Jude  . Appendectomy   . Tubal ligation   . Tee without cardioversion 09/23/2011    Procedure: TRANSESOPHAGEAL ECHOCARDIOGRAM (TEE);  Surgeon: Chrystie Nose;  Location: [facility] ENDOSCOPY;  Service: Cardiovascular;  Laterality: N/A;   Prior to Admission medications   Medication Sig Start Date End Date Taking? Authorizing Provider  Dexlansoprazole (DEXILANT) 30  MG capsule Take 30 mg by mouth daily.   Yes Historical Provider, MD  digoxin (LANOXIN) 0.125 MG tablet Take 1 tablet (0.125 mg total) by mouth daily. 09/28/11 09/27/12 Yes Hollice Espy, MD  famotidine (PEPCID) 20 MG tablet Take 20 mg by mouth 2 (two) times daily.     Yes Historical Provider, MD  metoprolol tartrate (LOPRESSOR) 25 MG tablet Take 1.5 tablets (37.5 mg total) by mouth 2 (two) times daily. 09/28/11  Yes Hollice Espy, MD  warfarin (COUMADIN) 5 MG tablet Take 2.5 mg by mouth daily. 2.5 mg on Monday and 5 mg daily on other days 09/30/11  Yes Stephani Police, PA   Allergies Allergies  Allergen Reactions  . Codeine Nausea And Vomiting    Family History History reviewed. No pertinent family history. Social History  reports that she has never smoked. She has never used smokeless tobacco. She reports that she does not drink alcohol or use illicit drugs.  Review Of Systems:   Gen: Denies fever, chills, weight change, fatigue, night sweats HEENT: Denies blurred vision, double vision, hearing loss, tinnitus, sinus congestion, rhinorrhea, sore throat, neck stiffness, dysphagia PULM: Denies shortness of breath, cough, sputum production, hemoptysis, wheezing CV: Denies chest pain, edema, orthopnea, paroxysmal nocturnal dyspnea, palpitations GI: Denies diarrhea, hematochezia, melena, constipation, change in bowel habits.  See history of present illness for details GU: Denies dysuria, hematuria, polyuria, oliguria,  urethral discharge Endocrine: Denies hot or cold intolerance, polyuria, polyphagia or appetite change Derm: Denies rash, dry skin, scaling or peeling skin change Heme: Denies easy bruising, bleeding, bleeding gums Neuro: Denies headache, numbness, weakness, slurred speech, loss of memory or consciousness   Brief patient description:  71 year old female with a medical history of atrial fibrillation and mitral valve replacement on Coumadin who presented with acute abdominal pain.   CT of the abdomen concerning for hemorrhage within the liver.  Planned embolization by interventional radiology on 6/27.  Events Since Admission:   Current Status: VSS.  No acute distress  Vital Signs: Temp:  [97.4 F (36.3 C)-98.3 F (36.8 C)] 98 F (36.7 C) (06/27 1116) Pulse Rate:  [59-81] 62  (06/27 1101) Resp:  [12-28] 23  (06/27 1101) BP: (79-166)/(41-85) 165/79 mmHg (06/27 1211) SpO2:  [92 %-100 %] 98 % (06/27 1211)  Physical Examination: General:  Well-developed well-nourished in no acute distress Neuro:  Awake, alert, oriented / moving all extremities HEENT:  Mm pink / moist Cardiovascular:  S1-S2 irregularly irregular rate,controlled in the 50s/ 60s Lungs:  Respirations are even and nonlabored, lungs bilaterally are clear Abdomen:  Round/soft, bowel sounds x4 hypoactive Musculoskeletal:  No obvious deformity Skin:  Warm and dry, no edema  Active Problems:  S/P mitral valve replacement, St Jude  Presence of permanent cardiac pacemaker  Chronic anticoagulation, ( INR goal 2.5-3.5 under normal circumstances for ST Jude MVR)  A-fib  Hemorrhage intraabdominal   ASSESSMENT AND PLAN  PULMONARY No results found for this basename: PHART:5,PCO2:5,PCO2ART:5,PO2ART:5,HCO3:5,O2SAT:5 in the last 168 hours Ventilator Settings:   CXR:  No new chest x-ray. Atelectasis in the bases noted on CT of the abdomen ETT:    A:   Basilar atelectasis P:   -O2 to support sats > 92% -pulmonary hygiene  CARDIOVASCULAR No results found for this basename: TROPONINI:5,LATICACIDVEN:5, O2SATVEN:5,PROBNP:5 in the last 168 hours ECG:  HR 50's / 60's, irregular, PAC's Lines:   A:  Hx of HTN Hx of Afib on coumadin Hx of MVR  P:  -tele monitor -hold home metoprolol, lanoxin, coumadin for now   RENAL  Lab 03/17/12 0025  NA 134*  K 3.9  CL 103  CO2 19  BUN 17  CREATININE 0.95  CALCIUM 9.3  MG --  PHOS --   Intake/Output      06/26 0701 - 06/27 0700 06/27 0701 - 06/28  0700   Blood  307   Total Intake  307   Urine 25    Total Output 25    Net -25 +307        Urine Occurrence 1 x     Foley:    A:   Mild Hyponatremia  P:   -trend BMP -monitor renal function with relative volume loss  GASTROINTESTINAL  Lab 03/17/12 0025  AST 113*  ALT 57*  ALKPHOS 116  BILITOT 0.5  PROT 7.1  ALBUMIN 3.1*   6/26 CT ABD/Pelvis:  Contrast collection in the left lobe of the liver suggesting focus of active extravasation likely due to pseudoaneurysm versus arterial portal shunting versus hemorrhagic tumor. Cystic changes in the lateral segment of the left lobe may represent mass lesion, bile duct dilatation, or hematomas. Large amount of free hemorrhage throughout the peritoneum.  A:   Hepatic / Peritoneal Hemorrhage -no clear injury.  ?spontaneous bleed.  INR within therapeutic range.   P:   -To IR for embolization -Rec's per [facility] / IR  HEMATOLOGIC  Lab 03/17/12 0030 03/17/12  0025  HGB -- 8.2*  HCT -- 25.4*  PLT -- 244  INR 2.75* --  APTT -- --   A:   Acute blood loss anemia Coagulopathy -on coumadin as above P:  -FFP / FEIBA / reversal per primary -hold coumadin  INFECTIOUS  Lab 03/17/12 0025  WBC 20.5*  PROCALCITON --   Cultures:  Antibiotics: 6/27 Cefazolin>>>  A:   No evidence of acute infection.   P:   -monitor, trend leuks, fever curve  ENDOCRINE  Lab 03/16/12 2326  GLUCAP 184*   A:   No acute issues   P:   -monitor glucose on BMP  NEUROLOGIC  A:   No acute issues.   P:   -supportive care  BEST PRACTICE / DISPOSITION Level of Care:  ICU Primary Service:  [facility] Consultants:  PCCM Code Status:  Full Diet:  NPO DVT Px:  SCD's  GI Px:  protonix Skin Integrity:  intact Social / Family:  None at bedside.  Patient updated.    Canary Brim, NP-C [place] [facility] Pgr: [phone] or [phone]    03/17/2012, 12:22 PM    STAFF NOTE: I, Dr Lavinia Sharps have personally reviewed patient's  available data, including medical history, events of note, physical examination and test results as part of my evaluation. I have discussed with resident/NP and other care providers such as pharmacist, RN and RRT.  In addition,  I personally evaluated patient and elicited key findings of intraperitoneal hemorrhage. Hemodynamically stable but high risk for decompensation. S/p embolzation and doing well. Cards and IR have decided to start IV  Heparin gtt with frequent ccbc check to protect heart valve.  Rest per NP/medical resident whose note is outlined above and that I agree with  The patient is critically ill with multiple organ systems failure and requires high complexity decision making for assessment and support, frequent evaluation and titration of therapies, application of advanced monitoring technologies and extensive interpretation of multiple databases.   Critical Care Time devoted to patient care services described in this note is  45  Minutes.  Dr. Kalman Shan, M.D., [facility]C.P Pulmonary and Critical Care Medicine Staff Physician [facility] Pager: [phone], If no answer or between  15:00h - 7:00h: call [phone]  03/17/2012 5:59 PM

## 2012-03-17 NOTE — Progress Notes (Signed)
Patient ID: Carrie [name], female   DOB: 15-May-1941, 71 y.o.   MRN: [id number] Request received for hepatic arteriogram/possible embolization on pt secondary to finding of hepatic bleed/hemoperitoneum on recent CT. Pt was admitted 6/26 with abd pain, nausea/vomiting. She has been on coumadin for afib/mech heart valve and currently getting FFP for elevated PT/INR. Imaging studies have been reviewed by Dr. Bonnielee Haff and case d/w Dr. Carolynne Edouard from [facility]. Exam : pt alert and oriented, chest - decreased BS bases, greater on right; heart- RRR with occ ectopy; abd.- soft, diffusely tender, hypoactive BS; ext with FROM, no edema. Past Medical History  Diagnosis Date  . Hypertension   . Atrial fibrillation     normal coroonaries cath 2004. Dr Clarene [facility]   . Sick sinus syndrome     Dr Sharrell Ku. EP study negative for inducibel arrythmia. ? pacer since 2007  . Hyperlipidemia   . Arthritis   . Anxiety   . Diverticula of colon   . Pneumonia 2009    resolved.? OPD Rx   Past Surgical History  Procedure Date  . Pacemaker insertion   . Mitral valve replacement 1998    St Jude  . Appendectomy   . Tubal ligation   . Tee without cardioversion 09/23/2011    Procedure: TRANSESOPHAGEAL ECHOCARDIOGRAM (TEE);  Surgeon: Chrystie Nose;  Location: [facility] ENDOS[facility];  Service: Cardiovascular;  Laterality: N/A;    Filed Vitals:   03/17/12 0730 03/17/12 0800 03/17/12 0925 03/17/12 0940  BP: 137/55 152/58 121/55 126/57  Pulse: 62 63 64   Temp:   97.8 F (36.6 C) 97.9 F (36.6 C)  TempSrc:   Oral   Resp: 25 17 18    SpO2: 99% 99%     Results for orders placed during the hospital encounter of 03/16/12  GLUCOSE, CAPILLARY      Component Value Range   Glucose-Capillary 184 (*) 70 - 99 mg/dL  CBC WITH DIFFERENTIAL      Component Value Range   WBC 20.5 (*) 4.0 - 10.5 K/uL   RBC 2.85 (*) 3.87 - 5.11 MIL/uL   Hemoglobin 8.2 (*) 12.0 - 15.0 g/dL   HCT 91.4 (*) 78.2 - 95.6 %   MCV 89.1  78.0 - 100.0 fL   MCH 28.8  26.0 -  34.0 pg   MCHC 32.3  30.0 - 36.0 g/dL   RDW 21.3 (*) 08.6 - 57.8 %   Platelets 244  150 - 400 K/uL   Neutrophils Relative 83 (*) 43 - 77 %   Lymphocytes Relative 11 (*) 12 - 46 %   Monocytes Relative 6  3 - 12 %   Eosinophils Relative 0  0 - 5 %   Basophils Relative 0  0 - 1 %   Neutro Abs 17.0 (*) 1.7 - 7.7 K/uL   Lymphs Abs 2.3  0.7 - 4.0 K/uL   Monocytes Absolute 1.2 (*) 0.1 - 1.0 K/uL   Eosinophils Absolute 0.0  0.0 - 0.7 K/uL   Basophils Absolute 0.0  0.0 - 0.1 K/uL   WBC Morphology TOXIC GRANULATION    COMPREHENSIVE METABOLIC PANEL      Component Value Range   Sodium 134 (*) 135 - 145 mEq/L   Potassium 3.9  3.5 - 5.1 mEq/L   Chloride 103  96 - 112 mEq/L   CO2 19  19 - 32 mEq/L   Glucose, Bld 172 (*) 70 - 99 mg/dL   BUN 17  6 - 23 mg/dL   Creatinine,  Ser 0.95  0.50 - 1.10 mg/dL   Calcium 9.3  8.4 - 16.1 mg/dL   Total Protein 7.1  6.0 - 8.3 g/dL   Albumin 3.1 (*) 3.5 - 5.2 g/dL   AST 096 (*) 0 - 37 U/L   ALT 57 (*) 0 - 35 U/L   Alkaline Phosphatase 116  39 - 117 U/L   Total Bilirubin 0.5  0.3 - 1.2 mg/dL   GFR calc non Af Amer 59 (*) >90 mL/min   GFR calc Af Amer 69 (*) >90 mL/min  LIPASE, BLOOD      Component Value Range   Lipase 67 (*) 11 - 59 U/L  PROTIME-INR      Component Value Range   Prothrombin Time 29.5 (*) 11.6 - 15.2 seconds   INR 2.75 (*) 0.00 - 1.49  DIGOXIN LEVEL      Component Value Range   Digoxin Level 0.7 (*) 0.8 - 2.0 ng/mL  POCT I-STAT TROPONIN I      Component Value Range   Troponin i, poc 0.01  0.00 - 0.08 ng/mL   Comment 3           URINALYSIS, ROUTINE W REFLEX MICROSCOPIC      Component Value Range   Color, Urine YELLOW  YELLOW   APPearance CLOUDY (*) CLEAR   Specific Gravity, Urine 1.018  1.005 - 1.030   pH 6.0  5.0 - 8.0   Glucose, UA NEGATIVE  NEGATIVE mg/dL   Hgb urine dipstick NEGATIVE  NEGATIVE   Bilirubin Urine NEGATIVE  NEGATIVE   Ketones, ur NEGATIVE  NEGATIVE mg/dL   Protein, ur 045 (*) NEGATIVE mg/dL   Urobilinogen, UA  1.0  0.0 - 1.0 mg/dL   Nitrite NEGATIVE  NEGATIVE   Leukocytes, UA MODERATE (*) NEGATIVE  URINE MICROSCOPIC-ADD ON      Component Value Range   Squamous Epithelial / LPF MANY (*) RARE   WBC, UA 0-2  <3 WBC/hpf   RBC / HPF 0-2  <3 RBC/hpf   Bacteria, UA RARE  RARE   Urine-Other MUCOUS PRESENT    TYPE AND SCREEN      Component Value Range   ABO/RH(D) A POS     Antibody Screen NEG     Sample Expiration 03/20/2012    PREPARE FRESH FROZEN PLASMA      Component Value Range   Unit Number [id number]     Blood Component Type THAWED PLASMA     Unit division 00     Status of Unit ALLOCATED     Transfusion Status OK TO TRANSFUSE     Unit Number [id number]     Blood Component Type THAWED PLASMA     Unit division 00     Status of Unit ISSUED     Transfusion Status OK TO TRANSFUSE      Ct Abdomen Pelvis W Contrast  03/17/2012  *RADIOLOGY REPORT*  Clinical Data: Generalized abdominal pain, nausea and vomiting.  CT ABDOMEN AND PELVIS WITH CONTRAST  Technique:  Multidetector CT imaging of the abdomen and pelvis was performed following the standard protocol during bolus administration of intravenous contrast.  Contrast: OMNIPAQUE IOHEXOL 300 MG/ML  SOLN  Comparison: None.  Findings: Atelectasis or infiltration in the lung bases.  Diffuse cardiac enlargement.  Mitral valve replacement.  Coronary artery calcifications.  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  Focal contrast collections in the left lobe of the liver centrally measuring  2.4 x 3.9 cm with multiple complex cystic changes in the lateral segment of the left lobe.  The cystic changes could represent a complex cyst such as hemorrhagic or infectious cysts versus local bile duct dilatation versus mass lesions.  The contrast collection to suggest active extravasation which could be due to pseudoaneurysm, hemorrhagic tumor, or arterial portal shunt. There is a moderately large  amount of complex fluid in the upper abdomen suggesting peritoneal hemorrhage.  Fluid extends down along the pericolic gutters bilaterally into the pelvis and in the inguinal canals via small hernias.  The gallbladder, pancreas and adrenal glands are unremarkable. Calcification of the abdominal aorta without aneurysm.  No retroperitoneal fluid collections.  Scarring in the renal parenchyma with small sub centimeter parenchymal cysts.  No hydronephrosis or solid renal mass lesions.  There is infiltrative change in the mesenteric, likely resulting from the peritoneal fluid although mesenteric metastasis is not excluded.  The stomach, small bowel, and colon are not abnormally distended.  No free air in the abdomen.  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  No abnormal adnexal masses.  Bladder wall is not thickened.  Hemorrhagic fluid in the pelvis as previously discussed.  Scattered diverticula without diverticulitis.  The appendix appears normal.  Degenerative changes in the spine.  IMPRESSION: Contrast collection in the left lobe of the liver suggesting focus of active extravasation likely due to pseudoaneurysm versus arterial portal shunting versus hemorrhagic tumor.  Cystic changes in the lateral segment of the left lobe may represent mass lesion, bile duct dilatation, or hematomas.  Large amount of free hemorrhage throughout the peritoneum.  Critical Value/emergent results were called by telephone at the time of interpretation on 03/17/2012  at 0546 hours  to  Dr. Orson Slick, who verbally acknowledged these results.  Original Report Authenticated By: Marlon Pel, M.D.   Dg Abd Acute W/chest  03/17/2012  *RADIOLOGY REPORT*  Clinical Data: Mid abdominal pain and nausea.  ACUTE ABDOMEN SERIES (ABDOMEN 2 VIEW & CHEST 1 VIEW)  Comparison: Chest 09/29/2011  Findings: Stable appearance of cardiac pacemaker and postoperative changes in the mediastinum.  Shallow inspiration.  Cardiac enlargement with normal  pulmonary vascularity for technique. Linear atelectasis or fibrosis in the lung bases.  No focal consolidation.  No blunting of costophrenic angles.  No pneumothorax.  Gas and stool scattered throughout the colon.  No small or large bowel distension.  No free intra-abdominal air.  No abnormal air fluid levels.  No radiopaque stones.  Mild degenerative changes in the spine and hips.  IMPRESSION: Shallow inspiration with linear atelectasis or fibrosis in the lung bases, stable since previous study.  Nonobstructive bowel gas pattern.  Original Report Authenticated By: Marlon Pel, M.D.  Assessment/Plan: 71 year old black female with hx abd pain, N/V and evidence of hemoperitoneum/ hepatic bleed. Plan is for hepatic arteriogram with possible embolization today. Details/risks of above d/w pt with her understanding and consent.

## 2012-03-17 NOTE — Progress Notes (Signed)
Pharmacy Brief Note:  Patient admitted with hepatic bleeding, received anticoagulation reversal protocol with FEIBA 500 mg IV x 1, FFP and Vitamin K 10 mg IV x 1 this AM.  Patient is now in IR for hepatic arteriogram and possible embolism.  FEIBA PRN dose and nursing order under the reversal protocol still active.  Pharmacy at this time will discontinue the PRN dose of FEIBA and labs given no further indication to follow FEIBA administration.  Please contact [phone] with concerns/questions.  Geoffry Paradise, PharmD.

## 2012-03-17 NOTE — H&P (Signed)
Also bedrest, serial hg, and icu monitoring

## 2012-03-17 NOTE — ED Provider Notes (Signed)
Medical screening examination/treatment/procedure(s) were conducted as a shared visit with non-physician practitioner(s) and myself.  I personally evaluated the patient during the encounter  5:57 AM Abdomen soft, diffusely tender. Patient advised of CT findings and treatment plan.  Hanley Seamen, MD 03/17/12 [phone]

## 2012-03-17 NOTE — ED Notes (Signed)
Pt resting in bed, states she feels "bad", husband at bedside, awaiting test results, pt VS stable, skin remains cool

## 2012-03-17 NOTE — Progress Notes (Signed)
agree

## 2012-03-17 NOTE — Progress Notes (Signed)
[facility] initially asked to see patient as well as Gen surgery, and following evaluation, per discussion with Dr Carolynne Edouard patient will be admitted to his service and he has consulted [facility] and cardiology. Appreciate [facility] assistance with this pt's care and since [facility] is consulted, no need for further [facility] involvement at this time. Please call us back prn.  Donnalee Curry [phone]

## 2012-03-17 NOTE — Consult Note (Signed)
Reason for Consult: Abdominal pain, nausea/vomiting and bleed from liver Referring Physician: Molpus (ER-MD) Primary Care: Dr. Vita Bland Cardiology: Dr. Al Little Hematology: Dr. Kalsoon Kahn   Carrie [name] is an 71 y.o. female.   HPI: Patient is a 71-year-old African American female, who presents with abdominal pain nausea and vomiting which started last night. She had a similar episode approximately 3-4 weeks ago. She was seen by Dr. Vita Bland, we treated her for GERD-like symptoms. Patient said she was doing better and saw Dr. Bland earlier this week her abdomen was not tender and she was doing well. Last night around 7 PM after eating dinner she started having some nausea, he was eventually able to vomit a small amount. Then she started having abdominal pain. The symptoms did not improve and she was subsequently transported to the emergency room and [facility]. Here she is found to have a white count of 20,500. Hemoglobin 8.2. Pro time of 29.5 INR 2.75. CT scan shows focal contrast collection in left lobe of the liver with complex cystic changes which could represent a complex cyst such as a hemorrhagic or infectious cyst,  there is extravasation which could be due to a pseudoaneurysm, hemorrhagic tumor, or portal shunt. There is also a large amount of hemorrhage throughout the peritoneum. We were asked to see in the ER.    Past Medical History   Diagnosis  Date   .  Hypertension     .  Atrial fibrillation         normal coroonaries cath 2004. Dr Little [facility]    .  Sick sinus syndrome         Dr GReg Taylor. EP study negative for inducibel arrythmia. ? pacer since 2007   .  Hyperlipidemia     .  Arthritis     .  Anxiety     .  Diverticula of colon     .  Pneumonia  2009       resolved.? 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 Dr. K Kahn Cataracts, she has had left done, scheduled to have Right  done in July.         Past Surgical History   Procedure  Date   .  Pacemaker insertion     .  Mitral valve replacement  1998       [employer]   .  Appendectomy     .  Tubal ligation  Left Cataract     .  Tee without cardioversion  09/23/2011       Procedure: TRANSESOPHAGEAL ECHOCARDIOGRAM (TEE);  Surgeon: Kenneth C. Hilty;  Location: [facility] ENDOSCOPY;  Service: Cardiovascular;  Laterality: N/A;        No family history on file.   Social History: reports that she has never smoked. She does not have any smokeless tobacco history on file. She reports that she does not drink alcohol or use illicit drugs.   Allergies:   Allergies   Allergen  Reactions   .  Codeine  Nausea And Vomiting      Medications:  Prior to Admission:  Prescriptions prior to admission   Medication  Sig  Dispense  Refill   .  Dexlansoprazole (DEXILANT) 30 MG capsule  Take 30 mg by mouth daily.         .  digoxin (LANOXIN) 0.125 MG tablet  Take 1 tablet (0.125 mg total)   by mouth daily.   30 tablet   0   .  famotidine (PEPCID) 20 MG tablet  Take 20 mg by mouth 2 (two) times daily.           .  metoprolol tartrate (LOPRESSOR) 25 MG tablet  Take 1.5 tablets (37.5 mg total) by mouth 2 (two) times daily.   45 tablet   0   .  warfarin (COUMADIN) 5 MG tablet  Take 2.5 mg by mouth daily. 2.5 mg on Monday and 5 mg daily on other days          Scheduled:     .  anti-inhibitor coagulant complex   500 Units  Intravenous  STAT   .   morphine injection   4 mg  Intravenous  Once   .  ondansetron (ZOFRAN) IV   4 mg  Intravenous  Once   .  pantoprazole (PROTONIX) IV   40 mg  Intravenous  QHS   .  phytonadione (VITAMIN K) IV   10 mg  Intravenous  STAT    Continuous:     .  dextrose 5 % and 0.9 % NaCl with KCl 20 mEq/L       PRN:anti-inhibitor coagulant complex, iohexol, morphine injection, ondansetron Anti-infectives      None          Results for orders placed during the hospital encounter of 03/16/12 (from the past  48 hour(s))   GLUCOSE, CAPILLARY     Status: Abnormal     Collection Time     03/16/12 11:26 PM       Component  Value  Range  Comment     Glucose-Capillary  184 (*)  70 - 99 mg/dL     CBC WITH DIFFERENTIAL     Status: Abnormal     Collection Time     03/17/12 12:25 AM       Component  Value  Range  Comment     WBC  20.5 (*)  4.0 - 10.5 K/uL       RBC  2.85 (*)  3.87 - 5.11 MIL/uL       Hemoglobin  8.2 (*)  12.0 - 15.0 g/dL       HCT  25.4 (*)  36.0 - 46.0 %       MCV  89.1   78.0 - 100.0 fL       MCH  28.8   26.0 - 34.0 pg       MCHC  32.3   30.0 - 36.0 g/dL       RDW  18.6 (*)  11.5 - 15.5 %       Platelets  244   150 - 400 K/uL       Neutrophils Relative  83 (*)  43 - 77 %       Lymphocytes Relative  11 (*)  12 - 46 %       Monocytes Relative  6   3 - 12 %       Eosinophils Relative  0   0 - 5 %       Basophils Relative  0   0 - 1 %       Neutro Abs  17.0 (*)  1.7 - 7.7 K/uL       Lymphs Abs  2.3   0.7 - 4.0 K/uL       Monocytes Absolute  1.2 (*)  0.1 - 1.0 K/uL         Eosinophils Absolute  0.0   0.0 - 0.7 K/uL       Basophils Absolute  0.0   0.0 - 0.1 K/uL       WBC Morphology  TOXIC GRANULATION        COMPREHENSIVE METABOLIC PANEL     Status: Abnormal     Collection Time     03/17/12 12:25 AM       Component  Value  Range  Comment     Sodium  134 (*)  135 - 145 mEq/L       Potassium  3.9   3.5 - 5.1 mEq/L       Chloride  103   96 - 112 mEq/L       CO2  19   19 - 32 mEq/L       Glucose, Bld  172 (*)  70 - 99 mg/dL       BUN  17   6 - 23 mg/dL       Creatinine, Ser  0.95   0.50 - 1.10 mg/dL       Calcium  9.3   8.4 - 10.5 mg/dL       Total Protein  7.1   6.0 - 8.3 g/dL       Albumin  3.1 (*)  3.5 - 5.2 g/dL       AST  113 (*)  0 - 37 U/L       ALT  57 (*)  0 - 35 U/L       Alkaline Phosphatase  116   39 - 117 U/L       Total Bilirubin  0.5   0.3 - 1.2 mg/dL       GFR calc non Af Amer  59 (*)  >90 mL/min       GFR calc Af Amer  69 (*)  >90 mL/min     LIPASE, BLOOD      Status: Abnormal     Collection Time     03/17/12 12:25 AM       Component  Value  Range  Comment     Lipase  67 (*)  11 - 59 U/L     PROTIME-INR     Status: Abnormal     Collection Time     03/17/12 12:30 AM       Component  Value  Range  Comment     Prothrombin Time  29.5 (*)  11.6 - 15.2 seconds       INR  2.75 (*)  0.00 - 1.49     DIGOXIN LEVEL     Status: Abnormal     Collection Time     03/17/12 12:30 AM       Component  Value  Range  Comment     Digoxin Level  0.7 (*)  0.8 - 2.0 ng/mL     POCT I-STAT TROPONIN I     Status: Normal     Collection Time     03/17/12 12:40 AM       Component  Value  Range  Comment     Troponin i, poc  0.01   0.00 - 0.08 ng/mL       Comment 3                URINALYSIS, ROUTINE W REFLEX MICROSCOPIC     Status: Abnormal     Collection Time     03/17/12  3:57 AM         Component  Value  Range  Comment     Color, Urine  YELLOW   YELLOW       APPearance  CLOUDY (*)  CLEAR       Specific Gravity, Urine  1.018   1.005 - 1.030       pH  6.0   5.0 - 8.0       Glucose, UA  NEGATIVE   NEGATIVE mg/dL       Hgb urine dipstick  NEGATIVE   NEGATIVE       Bilirubin Urine  NEGATIVE   NEGATIVE       Ketones, ur  NEGATIVE   NEGATIVE mg/dL       Protein, ur  100 (*)  NEGATIVE mg/dL       Urobilinogen, UA  1.0   0.0 - 1.0 mg/dL       Nitrite  NEGATIVE   NEGATIVE       Leukocytes, UA  MODERATE (*)  NEGATIVE     URINE MICROSCOPIC-ADD ON     Status: Abnormal     Collection Time     03/17/12  3:57 AM       Component  Value  Range  Comment     Squamous Epithelial / LPF  MANY (*)  RARE       WBC, UA  0-2   <3 WBC/hpf       RBC / HPF  0-2   <3 RBC/hpf       Bacteria, UA  RARE   RARE       Urine-Other  MUCOUS PRESENT        PREPARE FRESH FROZEN PLASMA     Status: Normal (Preliminary result)     Collection Time     03/17/12  6:30 AM       Component  Value  Range  Comment     Unit Number  [id number]          Blood Component Type  THAWED PLASMA          Unit division   00          Status of Unit  ALLOCATED          Transfusion Status  OK TO TRANSFUSE          Unit Number  [id number]          Blood Component Type  THAWED PLASMA          Unit division  00          Status of Unit  ALLOCATED          Transfusion Status  OK TO TRANSFUSE        TYPE AND SCREEN     Status: Normal     Collection Time     03/17/12  6:35 AM       Component  Value  Range  Comment     ABO/RH(D)  A POS          Antibody Screen  NEG          Sample Expiration  03/20/2012           Ct Abdomen Pelvis W Contrast   03/17/2012  *RADIOLOGY REPORT*  Clinical Data: Generalized abdominal pain, nausea and vomiting.  CT ABDOMEN AND PELVIS WITH CONTRAST  Technique:  Multidetector CT imaging of the abdomen and pelvis was performed following the standard protocol during bolus administration of intravenous contrast.  Contrast: 100mL OMNIPAQUE IOHEXOL 300   MG/ML  SOLN  Comparison: None.  Findings: Atelectasis or infiltration in the lung bases.  Diffuse cardiac enlargement.  Mitral valve replacement.  Coronary artery calcifications.  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  Focal contrast collections in the left lobe of the liver centrally measuring 2.4 x 3.9 cm with multiple complex cystic changes in the lateral segment of the left lobe.  The cystic changes could represent a complex cyst such as hemorrhagic or infectious cysts versus local bile duct dilatation versus mass lesions.  The contrast collection to suggest active extravasation which could be due to pseudoaneurysm, hemorrhagic tumor, or arterial portal shunt. There is a moderately large amount of complex fluid in the upper abdomen suggesting peritoneal hemorrhage.  Fluid extends down along the pericolic gutters bilaterally into the pelvis and in the inguinal canals via small hernias.  The gallbladder, pancreas and adrenal glands are unremarkable. Calcification of the abdominal aorta  without aneurysm.  No retroperitoneal fluid collections.  Scarring in the renal parenchyma with small sub centimeter parenchymal cysts.  No hydronephrosis or solid renal mass lesions.  There is infiltrative change in the mesenteric, likely resulting from the peritoneal fluid although mesenteric metastasis is not excluded.  The stomach, small bowel, and colon are not abnormally distended.  No free air in the abdomen.  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  No abnormal adnexal masses.  Bladder wall is not thickened.  Hemorrhagic fluid in the pelvis as previously discussed.  Scattered diverticula without diverticulitis.  The appendix appears normal.  Degenerative changes in the spine.  IMPRESSION: Contrast collection in the left lobe of the liver suggesting focus of active extravasation likely due to pseudoaneurysm versus arterial portal shunting versus hemorrhagic tumor.  Cystic changes in the lateral segment of the left lobe may represent mass lesion, bile duct dilatation, or hematomas.  Large amount of free hemorrhage throughout the peritoneum.  Critical Value/emergent results were called by telephone at the time of interpretation on 03/17/2012  at 0546 hours  to  Dr. Dammen, who verbally acknowledged these results.  Original Report Authenticated By: WILLIAM R. STEVENS, M.D.    Dg Abd Acute W/chest   03/17/2012  *RADIOLOGY REPORT*  Clinical Data: Mid abdominal pain and nausea.  ACUTE ABDOMEN SERIES (ABDOMEN 2 VIEW & CHEST 1 VIEW)  Comparison: Chest 09/29/2011  Findings: Stable appearance of cardiac pacemaker and postoperative changes in the mediastinum.  Shallow inspiration.  Cardiac enlargement with normal pulmonary vascularity for technique. Linear atelectasis or fibrosis in the lung bases.  No focal consolidation.  No blunting of costophrenic angles.  No pneumothorax.  Gas and stool scattered throughout the colon.  No small or large bowel distension.  No free intra-abdominal air.  No abnormal air fluid  levels.  No radiopaque stones.  Mild degenerative changes in the spine and hips.  IMPRESSION: Shallow inspiration with linear atelectasis or fibrosis in the lung bases, stable since previous study.  Nonobstructive bowel gas pattern.  Original Report Authenticated By: WILLIAM R. STEVENS, M.D.     Review of Systems  Constitutional: Negative.   HENT: Negative.   Eyes:        She has had left cataract done, but needs the Right done.  Respiratory: Positive for cough (occasional dry cough). Negative for hemoptysis, sputum production, shortness of breath and wheezing.         Does not really have DOE, but tires easily with exertion, says her walking has been poor

## 2012-03-17 NOTE — Progress Notes (Signed)
Paged by Dr. Allyson Sabal.  He and Dr. Bonnielee Haff had discussion regarding risks associated with MVR/Afib and risk of stroke / complications and hepatic bleed - they agree to proceed with heparin gtt and no bolus.    Plan: -Pharmacy consult for heparin dosing.  No bolus.   -Q6 hour CBC for 4 occurences to monitor for drop in hgb -bleeding precautions  Canary Brim, NP-C [facility] Pgr: [phone] or [phone]

## 2012-03-17 NOTE — ED Provider Notes (Signed)
History     CSN: [id number]  Arrival date & time 03/16/12  2221   First MD Initiated Contact with Patient 03/16/12 2350      Chief Complaint  Patient presents with  . Abdominal pain and vomiting    HPI  History provided by the patient and husband. Patient is a 71 year old female with history of hypertension, hyperlipidemia, atrial fibrillation and sick sinus syndrome status post pacemaker who presents with complaints of general abdominal pain and discomfort with episodes of nausea vomiting today. Symptoms came on acutely around 9 PM. Symptoms didn't start several hours after eating. Patient had white beans and collard greens. This was a normal female for her. Patient states the symptoms feel like indigestion and heartburn. She denies having any burning sensation of the throat or in the mouth. She denies any belching or hiccups. Patient did have a few episodes of vomiting with persistent nausea. Patient also feels lightheaded and generally "bad". Patient denies any aggravating or alleviating factors. Symptoms have been associated with some chills and feeling cold. She denies any other associated symptoms. Denies any fever, cough, shortness of breath, chest pain, heart palpitations, diarrhea or constipation. Denies any dysuria, hematuria, urinary frequency or flank pains.    Past Medical History  Diagnosis Date  . Hypertension   . Atrial fibrillation     normal coroonaries cath 2004. Dr Clarene [facility]   . Sick sinus syndrome     Dr Sharrell Ku. EP study negative for inducibel arrythmia. ? pacer since 2007  . Hyperlipidemia   . Arthritis   . Anxiety   . Diverticula of colon   . Pneumonia 2009    resolved.? OPD Rx    Past Surgical History  Procedure Date  . Pacemaker insertion   . Mitral valve replacement 1998    [employer]  . Appendectomy   . Tubal ligation   . Tee without cardioversion 09/23/2011    Procedure: TRANSESOPHAGEAL ECHOCARDIOGRAM (TEE);  Surgeon: Chrystie Nose;  Location:  [facility];  Service: Cardiovascular;  Laterality: N/A;    No family history on file.  History  Substance Use Topics  . Smoking status: Never Smoker   . Smokeless tobacco: Not on file  . Alcohol Use: No    OB History    Grav Para Term Preterm Abortions TAB SAB Ect Mult Living                  Review of Systems  Constitutional: Negative for fever and chills.  Respiratory: Positive for shortness of breath. Negative for cough.   Cardiovascular: Negative for chest pain and palpitations.  Gastrointestinal: Positive for nausea, vomiting and abdominal pain. Negative for diarrhea and constipation.  Skin: Negative for rash.    Allergies  Codeine  Home Medications   Current Outpatient Rx  Name Route Sig Dispense Refill  . DEXLANSOPRAZOLE 30 MG PO CPDR Oral Take 30 mg by mouth daily.    Marland Kitchen DIGOXIN 0.125 MG PO TABS Oral Take 1 tablet (0.125 mg total) by mouth daily. 30 tablet 0  . FAMOTIDINE 20 MG PO TABS Oral Take 20 mg by mouth 2 (two) times daily.      Marland Kitchen METOPROLOL TARTRATE 25 MG PO TABS Oral Take 1.5 tablets (37.5 mg total) by mouth 2 (two) times daily. 45 tablet 0  . WARFARIN SODIUM 5 MG PO TABS Oral Take 2.5 mg by mouth daily. 2.5 mg on Monday and 5 mg daily on other days      BP  112/60  Pulse 62  Temp 97.6 F (36.4 C) (Oral)  Resp 22  SpO2 100%  Physical Exam  Nursing note and vitals reviewed. Constitutional: She is oriented to person, place, and time. She appears well-developed and well-nourished. No distress.  HENT:  Head: Normocephalic and atraumatic.  Cardiovascular: Normal rate and regular rhythm.   Murmur heard.      Click consistent with mitral valve replacement.  Pulmonary/Chest: Effort normal and breath sounds normal. No respiratory distress. She has no wheezes. She exhibits no tenderness.       Pacemaker in left chest.  Abdominal: Soft. There is tenderness in the right upper quadrant, epigastric area and left upper quadrant. There is no rebound and no  guarding.  Neurological: She is alert and oriented to person, place, and time.  Skin: Skin is warm and dry.  Psychiatric: She has a normal mood and affect. Her behavior is normal.    ED Course  CRITICAL CARE Performed by: Angus Seller Authorized by: Angus Seller Total critical care time: 30 minutes Critical care was necessary to treat or prevent imminent or life-threatening deterioration of the following conditions: Intra-abdominal hemorrhage. Critical care was time spent personally by me on the following activities: discussions with consultants, examination of patient, obtaining history from patient or surrogate, ordering and performing treatments and interventions, ordering and review of laboratory studies, ordering and review of radiographic studies and re-evaluation of patient's condition.     Results for orders placed during the hospital encounter of 03/16/12  GLUCOSE, CAPILLARY      Component Value Range   Glucose-Capillary 184 (*) 70 - 99 mg/dL  CBC WITH DIFFERENTIAL      Component Value Range   WBC 20.5 (*) 4.0 - 10.5 K/uL   RBC 2.85 (*) 3.87 - 5.11 MIL/uL   Hemoglobin 8.2 (*) 12.0 - 15.0 g/dL   HCT 16.1 (*) 09.6 - 04.5 %   MCV 89.1  78.0 - 100.0 fL   MCH 28.8  26.0 - 34.0 pg   MCHC 32.3  30.0 - 36.0 g/dL   RDW 40.9 (*) 81.1 - 91.4 %   Platelets 244  150 - 400 K/uL   Neutrophils Relative 83 (*) 43 - 77 %   Lymphocytes Relative 11 (*) 12 - 46 %   Monocytes Relative 6  3 - 12 %   Eosinophils Relative 0  0 - 5 %   Basophils Relative 0  0 - 1 %   Neutro Abs 17.0 (*) 1.7 - 7.7 K/uL   Lymphs Abs 2.3  0.7 - 4.0 K/uL   Monocytes Absolute 1.2 (*) 0.1 - 1.0 K/uL   Eosinophils Absolute 0.0  0.0 - 0.7 K/uL   Basophils Absolute 0.0  0.0 - 0.1 K/uL   WBC Morphology TOXIC GRANULATION    COMPREHENSIVE METABOLIC PANEL      Component Value Range   Sodium 134 (*) 135 - 145 mEq/L   Potassium 3.9  3.5 - 5.1 mEq/L   Chloride 103  96 - 112 mEq/L   CO2 19  19 - 32 mEq/L    Glucose, Bld 172 (*) 70 - 99 mg/dL   BUN 17  6 - 23 mg/dL   Creatinine, Ser 7.82  0.50 - 1.10 mg/dL   Calcium 9.3  8.4 - 95.6 mg/dL   Total Protein 7.1  6.0 - 8.3 g/dL   Albumin 3.1 (*) 3.5 - 5.2 g/dL   AST 213 (*) 0 - 37 U/L   ALT 57 (*)  0 - 35 U/L   Alkaline Phosphatase 116  39 - 117 U/L   Total Bilirubin 0.5  0.3 - 1.2 mg/dL   GFR calc non Af Amer 59 (*) >90 mL/min   GFR calc Af Amer 69 (*) >90 mL/min  LIPASE, BLOOD      Component Value Range   Lipase 67 (*) 11 - 59 U/L  PROTIME-INR      Component Value Range   Prothrombin Time 29.5 (*) 11.6 - 15.2 seconds   INR 2.75 (*) 0.00 - 1.49  DIGOXIN LEVEL      Component Value Range   Digoxin Level 0.7 (*) 0.8 - 2.0 ng/mL  POCT I-STAT TROPONIN I      Component Value Range   Troponin i, poc 0.01  0.00 - 0.08 ng/mL   Comment 3           URINALYSIS, ROUTINE W REFLEX MICROSCOPIC      Component Value Range   Color, Urine YELLOW  YELLOW   APPearance CLOUDY (*) CLEAR   Specific Gravity, Urine 1.018  1.005 - 1.030   pH 6.0  5.0 - 8.0   Glucose, UA NEGATIVE  NEGATIVE mg/dL   Hgb urine dipstick NEGATIVE  NEGATIVE   Bilirubin Urine NEGATIVE  NEGATIVE   Ketones, ur NEGATIVE  NEGATIVE mg/dL   Protein, ur 478 (*) NEGATIVE mg/dL   Urobilinogen, UA 1.0  0.0 - 1.0 mg/dL   Nitrite NEGATIVE  NEGATIVE   Leukocytes, UA MODERATE (*) NEGATIVE  URINE MICROSCOPIC-ADD ON      Component Value Range   Squamous Epithelial / LPF MANY (*) RARE   WBC, UA 0-2  <3 WBC/hpf   RBC / HPF 0-2  <3 RBC/hpf   Bacteria, UA RARE  RARE   Urine-Other MUCOUS PRESENT         Ct Abdomen Pelvis W Contrast  03/17/2012  *RADIOLOGY REPORT*  Clinical Data: Generalized abdominal pain, nausea and vomiting.  CT ABDOMEN AND PELVIS WITH CONTRAST  Technique:  Multidetector CT imaging of the abdomen and pelvis was performed following the standard protocol during bolus administration of intravenous contrast.  Contrast: OMNIPAQUE IOHEXOL 300 MG/ML  SOLN  Comparison: None.   Findings: Atelectasis or infiltration in the lung bases.  Diffuse cardiac enlargement.  Mitral valve replacement.  Coronary artery calcifications.  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  Focal contrast collections in the left lobe of the liver centrally measuring 2.4 x 3.9 cm with multiple complex cystic changes in the lateral segment of the left lobe.  The cystic changes could represent a complex cyst such as hemorrhagic or infectious cysts versus local bile duct dilatation versus mass lesions.  The contrast collection to suggest active extravasation which could be due to pseudoaneurysm, hemorrhagic tumor, or arterial portal shunt. There is a moderately large amount of complex fluid in the upper abdomen suggesting peritoneal hemorrhage.  Fluid extends down along the pericolic gutters bilaterally into the pelvis and in the inguinal canals via small hernias.  The gallbladder, pancreas and adrenal glands are unremarkable. Calcification of the abdominal aorta without aneurysm.  No retroperitoneal fluid collections.  Scarring in the renal parenchyma with small sub centimeter parenchymal cysts.  No hydronephrosis or solid renal mass lesions.  There is infiltrative change in the mesenteric, likely resulting from the peritoneal fluid although mesenteric metastasis is not excluded.  The stomach, small bowel, and colon are not abnormally distended.  No free air  in the abdomen.  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  No abnormal adnexal masses.  Bladder wall is not thickened.  Hemorrhagic fluid in the pelvis as previously discussed.  Scattered diverticula without diverticulitis.  The appendix appears normal.  Degenerative changes in the spine.  IMPRESSION: Contrast collection in the left lobe of the liver suggesting focus of active extravasation likely due to pseudoaneurysm versus arterial portal shunting versus hemorrhagic tumor.  Cystic changes in  the lateral segment of the left lobe may represent mass lesion, bile duct dilatation, or hematomas.  Large amount of free hemorrhage throughout the peritoneum.  Critical Value/emergent results were called by telephone at the time of interpretation on 03/17/2012  at 0546 hours  to  Dr. Orson Slick, who verbally acknowledged these results.  Original Report Authenticated By: Marlon Pel, M.D.   Dg Abd Acute W/chest  03/17/2012  *RADIOLOGY REPORT*  Clinical Data: Mid abdominal pain and nausea.  ACUTE ABDOMEN SERIES (ABDOMEN 2 VIEW & CHEST 1 VIEW)  Comparison: Chest 09/29/2011  Findings: Stable appearance of cardiac pacemaker and postoperative changes in the mediastinum.  Shallow inspiration.  Cardiac enlargement with normal pulmonary vascularity for technique. Linear atelectasis or fibrosis in the lung bases.  No focal consolidation.  No blunting of costophrenic angles.  No pneumothorax.  Gas and stool scattered throughout the colon.  No small or large bowel distension.  No free intra-abdominal air.  No abnormal air fluid levels.  No radiopaque stones.  Mild degenerative changes in the spine and hips.  IMPRESSION: Shallow inspiration with linear atelectasis or fibrosis in the lung bases, stable since previous study.  Nonobstructive bowel gas pattern.  Original Report Authenticated By: Marlon Pel, M.D.     1. Intra abdominal hemorrhage       MDM  12:00AM patient seen and evaluated. Patient in no acute distress.  Patient continues to be stable and resting comfortably.  Labs show significant drop in hemoglobin to 8 from 12, 3 months ago. INR is therapeutic at 2.75.  Spoke with radiologist. There is concerns for large hemorrhage in the abdomen around the liver area.  Spoke with attending physician. Will order Coumadin reversal protocol and type and screen.  Spoke with general surgeon, Dr. Daphine Deutscher, he will come see patient and follow. Would like hospitalist called and involved for  admission.  Spoke with [facility] hospitalists, Dr. Nedra Hai, he will see patient and evaluate for bed assignment.     Date: 03/17/2012  Rate: 61  Rhythm: Ventricular paced rhythm  QRS Axis:  Intervals:   ST/T Wave abnormalities: indeterminate  Conduction Disutrbances:none  Narrative Interpretation:   Old EKG Reviewed: changes noted from 09/06/2011 heart is now paced.     Angus Seller, [place] 03/17/12 [phone]

## 2012-03-17 NOTE — ED Notes (Signed)
Dr. Bonnielee Haff asked to hang ordered Ancef 1G IV PB.

## 2012-03-17 NOTE — ED Notes (Signed)
Dr. Bonnielee Haff accessed RFA with 5Fr sheath.  Groin level 0. 3+RDP.

## 2012-03-17 NOTE — ED Notes (Signed)
Pt resting in bed, ABC's intact, awaiting CT scan, pt skin remains cool.

## 2012-03-17 NOTE — Procedures (Signed)
RUE PICC SVC RA 32 cm No comp

## 2012-03-17 NOTE — Procedures (Signed)
Embolization L hepatic artery 3 x 4 mm platinum coils. No comp

## 2012-03-17 NOTE — H&P (Signed)
Reason for Consult: Abdominal pain, nausea/vomiting and bleed from liver Referring Physician: [place]us (ER-MD) Primary Care: Dr. Ocie Bob Cardiology: Dr. Caprice Kluver Hematology: Dr. Leafy Ro   Carrie [name] is an 71 y.o. female.   HPI: Patient is a 71 year old African American female, who presents with abdominal pain nausea and vomiting which started last night. She had a similar episode approximately 3-4 weeks ago. She was seen by Dr. Ocie Bob, we treated her for GERD-like symptoms. Patient said she was doing better and saw Dr. Parke Simmers earlier this week her abdomen was not tender and she was doing well. Last night around 7 PM after eating dinner she started having some nausea, he was eventually able to vomit a small amount. Then she started having abdominal pain. The symptoms did not improve and she was subsequently transported to the emergency room and [facility]. Here she is found to have a white count of 20,500. Hemoglobin 8.2. Pro time of 29.5 INR 2.75. CT scan shows focal contrast collection in left lobe of the liver with complex cystic changes which could represent a complex cyst such as a hemorrhagic or infectious cyst,  there is extravasation which could be due to a pseudoaneurysm, hemorrhagic tumor, or portal shunt. There is also a large amount of hemorrhage throughout the peritoneum. We were asked to see in the ER.    Past Medical History   Diagnosis  Date   .  Hypertension     .  Atrial fibrillation         normal coroonaries cath 2004. Dr Clarene [facility]    .  Sick sinus syndrome         Dr Sharrell Ku. EP study negative for inducibel arrythmia. ? pacer since 2007   .  Hyperlipidemia     .  Arthritis     .  Anxiety     .  Diverticula of colon     .  Pneumonia  2009       resolved.? 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 Dr. Sunday Corn Cataracts, she has had left done, scheduled to have Right  done in July.         Past Surgical History   Procedure  Date   .  Pacemaker insertion     .  Mitral valve replacement  1998       St Jude   .  Appendectomy     .  Tubal ligation  Left Cataract     .  Tee without cardioversion  09/23/2011       Procedure: TRANSESOPHAGEAL ECHOCARDIOGRAM (TEE);  Surgeon: Chrystie Nose;  Location: [facility] ENDOSCOPY;  Service: Cardiovascular;  Laterality: N/A;        No family history on file.   Social History: reports that she has never smoked. She does not have any smokeless tobacco history on file. She reports that she does not drink alcohol or use illicit drugs.   Allergies:   Allergies   Allergen  Reactions   .  Codeine  Nausea And Vomiting      Medications:  Prior to Admission:  Prescriptions prior to admission   Medication  Sig  Dispense  Refill   .  Dexlansoprazole (DEXILANT) 30 MG capsule  Take 30 mg by mouth daily.         .  digoxin (LANOXIN) 0.125 MG tablet  Take 1 tablet (0.125 mg total)  by mouth daily.   30 tablet   0   .  famotidine (PEPCID) 20 MG tablet  Take 20 mg by mouth 2 (two) times daily.           .  metoprolol tartrate (LOPRESSOR) 25 MG tablet  Take 1.5 tablets (37.5 mg total) by mouth 2 (two) times daily.   45 tablet   0   .  warfarin (COUMADIN) 5 MG tablet  Take 2.5 mg by mouth daily. 2.5 mg on Monday and 5 mg daily on other days          Scheduled:     .  anti-inhibitor coagulant complex   500 Units  Intravenous  STAT   .   morphine injection   4 mg  Intravenous  Once   .  ondansetron (ZOFRAN) IV   4 mg  Intravenous  Once   .  pantoprazole (PROTONIX) IV   40 mg  Intravenous  QHS   .  phytonadione (VITAMIN K) IV   10 mg  Intravenous  STAT    Continuous:     .  dextrose 5 % and 0.9 % NaCl with KCl 20 mEq/L       ZOX:[id number] coagulant complex, iohexol, morphine injection, ondansetron Anti-infectives      None          Results for orders placed during the hospital encounter of 03/16/12 (from the past  48 hour(s))   GLUCOSE, CAPILLARY     Status: Abnormal     Collection Time     03/16/12 11:26 PM       Component  Value  Range  Comment     Glucose-Capillary  184 (*)  70 - 99 mg/dL     CBC WITH DIFFERENTIAL     Status: Abnormal     Collection Time     03/17/12 12:25 AM       Component  Value  Range  Comment     WBC  20.5 (*)  4.0 - 10.5 K/uL       RBC  2.85 (*)  3.87 - 5.11 MIL/uL       Hemoglobin  8.2 (*)  12.0 - 15.0 g/dL       HCT  14.7 (*)  82.9 - 46.0 %       MCV  89.1   78.0 - 100.0 fL       MCH  28.8   26.0 - 34.0 pg       MCHC  32.3   30.0 - 36.0 g/dL       RDW  56.2 (*)  13.0 - 15.5 %       Platelets  244   150 - 400 K/uL       Neutrophils Relative  83 (*)  43 - 77 %       Lymphocytes Relative  11 (*)  12 - 46 %       Monocytes Relative  6   3 - 12 %       Eosinophils Relative  0   0 - 5 %       Basophils Relative  0   0 - 1 %       Neutro Abs  17.0 (*)  1.7 - 7.7 K/uL       Lymphs Abs  2.3   0.7 - 4.0 K/uL       Monocytes Absolute  1.2 (*)  0.1 - 1.0 K/uL  Eosinophils Absolute  0.0   0.0 - 0.7 K/uL       Basophils Absolute  0.0   0.0 - 0.1 K/uL       WBC Morphology  TOXIC GRANULATION        COMPREHENSIVE METABOLIC PANEL     Status: Abnormal     Collection Time     03/17/12 12:25 AM       Component  Value  Range  Comment     Sodium  134 (*)  135 - 145 mEq/L       Potassium  3.9   3.5 - 5.1 mEq/L       Chloride  103   96 - 112 mEq/L       CO2  19   19 - 32 mEq/L       Glucose, Bld  172 (*)  70 - 99 mg/dL       BUN  17   6 - 23 mg/dL       Creatinine, Ser  0.95   0.50 - 1.10 mg/dL       Calcium  9.3   8.4 - 10.5 mg/dL       Total Protein  7.1   6.0 - 8.3 g/dL       Albumin  3.1 (*)  3.5 - 5.2 g/dL       AST  147 (*)  0 - 37 U/L       ALT  57 (*)  0 - 35 U/L       Alkaline Phosphatase  116   39 - 117 U/L       Total Bilirubin  0.5   0.3 - 1.2 mg/dL       GFR calc non Af Amer  59 (*)  >90 mL/min       GFR calc Af Amer  69 (*)  >90 mL/min     LIPASE, BLOOD      Status: Abnormal     Collection Time     03/17/12 12:25 AM       Component  Value  Range  Comment     Lipase  67 (*)  11 - 59 U/L     PROTIME-INR     Status: Abnormal     Collection Time     03/17/12 12:30 AM       Component  Value  Range  Comment     Prothrombin Time  29.5 (*)  11.6 - 15.2 seconds       INR  2.75 (*)  0.00 - 1.49     DIGOXIN LEVEL     Status: Abnormal     Collection Time     03/17/12 12:30 AM       Component  Value  Range  Comment     Digoxin Level  0.7 (*)  0.8 - 2.0 ng/mL     POCT I-STAT TROPONIN I     Status: Normal     Collection Time     03/17/12 12:40 AM       Component  Value  Range  Comment     Troponin i, poc  0.01   0.00 - 0.08 ng/mL       Comment 3                URINALYSIS, ROUTINE W REFLEX MICROSCOPIC     Status: Abnormal     Collection Time     03/17/12  3:57 AM  Component  Value  Range  Comment     Color, Urine  YELLOW   YELLOW       APPearance  CLOUDY (*)  CLEAR       Specific Gravity, Urine  1.018   1.005 - 1.030       pH  6.0   5.0 - 8.0       Glucose, UA  NEGATIVE   NEGATIVE mg/dL       Hgb urine dipstick  NEGATIVE   NEGATIVE       Bilirubin Urine  NEGATIVE   NEGATIVE       Ketones, ur  NEGATIVE   NEGATIVE mg/dL       Protein, ur  161 (*)  NEGATIVE mg/dL       Urobilinogen, UA  1.0   0.0 - 1.0 mg/dL       Nitrite  NEGATIVE   NEGATIVE       Leukocytes, UA  MODERATE (*)  NEGATIVE     URINE MICROSCOPIC-ADD ON     Status: Abnormal     Collection Time     03/17/12  3:57 AM       Component  Value  Range  Comment     Squamous Epithelial / LPF  MANY (*)  RARE       WBC, UA  0-2   <3 WBC/hpf       RBC / HPF  0-2   <3 RBC/hpf       Bacteria, UA  RARE   RARE       Urine-Other  MUCOUS PRESENT        PREPARE FRESH FROZEN PLASMA     Status: Normal (Preliminary result)     Collection Time     03/17/12  6:30 AM       Component  Value  Range  Comment     Unit Number  [id number]          Blood Component Type  THAWED PLASMA          Unit division   00          Status of Unit  ALLOCATED          Transfusion Status  OK TO TRANSFUSE          Unit Number  [id number]          Blood Component Type  THAWED PLASMA          Unit division  00          Status of Unit  ALLOCATED          Transfusion Status  OK TO TRANSFUSE        TYPE AND SCREEN     Status: Normal     Collection Time     03/17/12  6:35 AM       Component  Value  Range  Comment     ABO/RH(D)  A POS          Antibody Screen  NEG          Sample Expiration  03/20/2012           Ct Abdomen Pelvis W Contrast   03/17/2012  *RADIOLOGY REPORT*  Clinical Data: Generalized abdominal pain, nausea and vomiting.  CT ABDOMEN AND PELVIS WITH CONTRAST  Technique:  Multidetector CT imaging of the abdomen and pelvis was performed following the standard protocol during bolus administration of intravenous contrast.  Contrast: OMNIPAQUE IOHEXOL 300  MG/ML  SOLN  Comparison: None.  Findings: Atelectasis or infiltration in the lung bases.  Diffuse cardiac enlargement.  Mitral valve replacement.  Coronary artery calcifications.  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  Focal contrast collections in the left lobe of the liver centrally measuring 2.4 x 3.9 cm with multiple complex cystic changes in the lateral segment of the left lobe.  The cystic changes could represent a complex cyst such as hemorrhagic or infectious cysts versus local bile duct dilatation versus mass lesions.  The contrast collection to suggest active extravasation which could be due to pseudoaneurysm, hemorrhagic tumor, or arterial portal shunt. There is a moderately large amount of complex fluid in the upper abdomen suggesting peritoneal hemorrhage.  Fluid extends down along the pericolic gutters bilaterally into the pelvis and in the inguinal canals via small hernias.  The gallbladder, pancreas and adrenal glands are unremarkable. Calcification of the abdominal aorta  without aneurysm.  No retroperitoneal fluid collections.  Scarring in the renal parenchyma with small sub centimeter parenchymal cysts.  No hydronephrosis or solid renal mass lesions.  There is infiltrative change in the mesenteric, likely resulting from the peritoneal fluid although mesenteric metastasis is not excluded.  The stomach, small bowel, and colon are not abnormally distended.  No free air in the abdomen.  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  No abnormal adnexal masses.  Bladder wall is not thickened.  Hemorrhagic fluid in the pelvis as previously discussed.  Scattered diverticula without diverticulitis.  The appendix appears normal.  Degenerative changes in the spine.  IMPRESSION: Contrast collection in the left lobe of the liver suggesting focus of active extravasation likely due to pseudoaneurysm versus arterial portal shunting versus hemorrhagic tumor.  Cystic changes in the lateral segment of the left lobe may represent mass lesion, bile duct dilatation, or hematomas.  Large amount of free hemorrhage throughout the peritoneum.  Critical Value/emergent results were called by telephone at the time of interpretation on 03/17/2012  at 0546 hours  to  Dr. Orson Slick, who verbally acknowledged these results.  Original Report Authenticated By: Marlon Pel, M.D.    Dg Abd Acute W/chest   03/17/2012  *RADIOLOGY REPORT*  Clinical Data: Mid abdominal pain and nausea.  ACUTE ABDOMEN SERIES (ABDOMEN 2 VIEW & CHEST 1 VIEW)  Comparison: Chest 09/29/2011  Findings: Stable appearance of cardiac pacemaker and postoperative changes in the mediastinum.  Shallow inspiration.  Cardiac enlargement with normal pulmonary vascularity for technique. Linear atelectasis or fibrosis in the lung bases.  No focal consolidation.  No blunting of costophrenic angles.  No pneumothorax.  Gas and stool scattered throughout the colon.  No small or large bowel distension.  No free intra-abdominal air.  No abnormal air fluid  levels.  No radiopaque stones.  Mild degenerative changes in the spine and hips.  IMPRESSION: Shallow inspiration with linear atelectasis or fibrosis in the lung bases, stable since previous study.  Nonobstructive bowel gas pattern.  Original Report Authenticated By: Marlon Pel, M.D.     Review of Systems  Constitutional: Negative.   HENT: Negative.   Eyes:        She has had left cataract done, but needs the Right done.  Respiratory: Positive for cough (occasional dry cough). Negative for hemoptysis, sputum production, shortness of breath and wheezing.         Does not really have DOE, but tires easily with exertion, says her walking has been poor  since her hospitalization in Nov 2012, for pneumonia and endocarditis.  Cardiovascular: Positive for chest pain (she had some chest pain with nausea and eventual vomiting last pm and 3 weeks ago.) and leg swelling (occasional ankle swelling). Negative for palpitations, orthopnea and claudication.  Gastrointestinal: Positive for heartburn, nausea, vomiting, abdominal pain and constipation (occasional, but not recently). Negative for diarrhea, blood in stool and melena.  Genitourinary: Negative.   Musculoskeletal: Positive for joint pain.        She has some arthritis, mostly in her hands.  Skin: Negative.   Neurological: Negative.   Endo/Heme/Allergies: Negative.   Psychiatric/Behavioral: Negative.   Blood pressure 137/55, pulse 62, temperature 98.3 F (36.8 C), temperature source Oral, resp. rate 25, SpO2 99.00%. Physical Exam  Constitutional: She is oriented to person, place, and time. No distress.       Thin, rather frail AAF, no acute distress, very frightened, and concerned she did something wrong. BP 137/55  HR 80 Vpaced.  HENT:   Head: Normocephalic and atraumatic.   Nose: Nose normal.  Eyes: Conjunctivae and EOM are normal. Pupils are equal, round, and reactive to light. Right eye exhibits no discharge. Left eye exhibits no  discharge. No scleral icterus.  Neck: Normal range of motion. Neck supple. No JVD present. No tracheal deviation present. No thyromegaly present.  Cardiovascular: Normal rate, regular rhythm, normal heart sounds and intact distal pulses.  Exam reveals no gallop.    No murmur heard.      mech mitral valve  Respiratory: Effort normal and breath sounds normal. No stridor. No respiratory distress. She has no wheezes. She has no rales. She exhibits no tenderness.  GI: Soft. Bowel sounds are normal. She exhibits no distension and no mass. There is tenderness (Rather diffusely tender, most tender mid epigastric and right side of abdomen, more RUQ, but some in RLQ.). There is no rebound and no guarding.  Musculoskeletal: She exhibits no edema and no tenderness.  Lymphadenopathy:    She has no cervical adenopathy.  Neurological: She is alert and oriented to person, place, and time. No cranial nerve deficit.  Skin: Skin is warm and dry. No rash noted. She is not diaphoretic. No erythema.  Psychiatric: She has a normal mood and affect. Her behavior is normal. Judgment and thought content normal.    Assessment/Plan: 1. Abdominal pain, nausea, vomiting, apparent bleed from the liver. 2. On Coumadin anticoagulation for age fibrillation and St. Jude's mitral valve. 3. Sick sinus syndrome with permanent transvenous pacemaker; Dr. Sharrell Ku. 4. 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 Leafy Ro. 5. Hypertension 6. Dyslipidemia 7. 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 Carolynne Edouard. With plans to admit the patient, he's contacted critical care medicine, cardiology, and interventional radiology. She is having her Coumadin reversed by the emergency room physician. We anticipate arteriogram later today with embolization of the source is found. With further workup and treatment as indicated. This is will  [place] PA for Dr. Chevis Pretty.   Waunetta Riggle 03/17/2012, 8:07 AM

## 2012-03-17 NOTE — Progress Notes (Signed)
ANTICOAGULATION CONSULT NOTE - Initial Consult  Pharmacy Consult for Heparin Indication: atrial fibrillation  Allergies  Allergen Reactions  . Codeine Nausea And Vomiting    Patient Measurements: Height: 5\' 1"  (154.9 cm) Weight: 131 lb 13.4 oz (59.8 kg) IBW/kg (Calculated) : 47.8  Heparin Dosing Weight: 59.8  Vital Signs: Temp: 98.2 F (36.8 C) (06/27 1550) Temp src: Oral (06/27 0925) BP: 137/63 mmHg (06/27 1700) Pulse Rate: 77  (06/27 1700)  Labs:  Basename 03/17/12 1434 03/17/12 1215 03/17/12 0030 03/17/12 0025  HGB 5.7* -- -- 8.2*  HCT 17.2* -- -- 25.4*  PLT 199 -- -- 244  APTT -- -- -- --  LABPROT -- 16.7* 29.5* --  INR -- 1.33 2.75* --  HEPARINUNFRC -- -- -- --  CREATININE -- -- -- 0.95  CKTOTAL -- -- -- --  CKMB -- -- -- --  TROPONINI -- -- -- --    Estimated Creatinine Clearance: 45.8 ml/min (by C-G formula based on Cr of 0.95).   Medical History: Past Medical History  Diagnosis Date  . Hypertension   . Atrial fibrillation     normal coroonaries cath 2004. Dr Clarene [facility]   . Sick sinus syndrome     Dr Sharrell Ku. EP study negative for inducibel arrythmia. ? pacer since 2007  . Hyperlipidemia   . Arthritis   . Anxiety   . Diverticula of colon   . Pneumonia 2009    resolved.? OPD Rx  . Hemorrhage intraabdominal 03/17/2012    Medications:  Scheduled:    . anti-inhibitor coagulant complex  500 Units Intravenous STAT  . ceFAZolin      .  ceFAZolin (ANCEF) IV  1 g Intravenous Once  . fentaNYL      . lidocaine      . midazolam      .  morphine injection  4 mg Intravenous Once  . ondansetron (ZOFRAN) IV  4 mg Intravenous Once  . pantoprazole (PROTONIX) IV  40 mg Intravenous QHS  . phytonadione (VITAMIN K) IV  10 mg Intravenous STAT   Infusions:    . sodium chloride 20 mL/hr at 03/17/12 1245  . dextrose 5 % and 0.9 % NaCl with KCl 20 mEq/L 1,000 mL (03/17/12 1422)   PRN: fentaNYL, iohexol, iohexol, midazolam, morphine injection, ondansetron,  DISCONTD: anti-inhibitor coagulant complex  Assessment:  71 yo F with MVR (St. Jude's filter), At Fib Goal of Therapy:  Heparin level 0.3-0.7 units/ml Monitor platelets by anticoagulation protocol: Yes   Plan:   Begin Heparin 900 units/hour.  No load request (as patient had had peritoneal hemorrhage and received anticoagulation reversal with FEIBA 500mg  x 1,FFP and Vit K x1)  HL 6 hours after starting Heparin  Daily HL and CBC   Loletta Specter 03/17/2012,5:35 PM

## 2012-03-17 NOTE — Consult Note (Signed)
Reason for Consult: Mechanical Mitral valve in pt with acute hemorrhage. Referring Physician: Dr. Carolynne Edouard   CARDIOLOGIST: DR. LITTLE  PCP: DR. Lissa Merlin is an 71 y.o. female.    Chief Complaint: abd pain and nausea and vomiting  PT NOT CURRENTLY AVAILABLE FOR EXAM, IN A PROCEDURE, THIS IS MORE HISTORICAL CONSULT UNTIL PT. AVAILABLE.    HPI: 71 year old African American female, who presents with abdominal pain nausea and vomiting which started last night. She had a similar episode approximately 3-4 weeks ago. She was seen by Dr. Ocie Bob, was treated for GERD-like symptoms. Patient said she was doing better and saw Dr. Parke Simmers earlier this week her abdomen was not tender and she was doing well. Last night around 7 PM after eating dinner she started having some nausea, she was eventually able to vomit a small amount. Then she started having abdominal pain. The symptoms did not improve and she was subsequently transported to the emergency room at [facility]. Here she was found to have a white count of 20,500. Hemoglobin 8.2. Pro time of 29.5 INR 2.75. CT scan shows focal contrast collection in left lobe of the liver with complex cystic changes which could represent a complex cyst such as a hemorrhagic or infectious cyst, there is extravasation which could be due to a pseudoaneurysm, hemorrhagic tumor, or portal shunt. There is also a large amount of hemorrhage throughout the peritoneum. She was given FFP. H/H today 8.2/25.4  INR down to 1.33 frim 2.75 on admit.  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 Nonsmoker. Nonalcoholic. Completely functional at baseline. No no diabetes or renal problems. Also in Dec. 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  TEE revealed a mobile mass attached to the  atrial aspect of the mitral prosthesis sewing ring.  No re do valve was indicated by Dr. Tyrone Sage.  After antibiotics recheck of valve by TEE revealed persistent mass on the mitral valve leaflet. 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 TCTS reconsulted and due to mental status changes and frail condition, medical therapy with long term anticoagulation and antibiotic therapy was planned. During that hospitalization she did have sm. Subarachnoid hemorrhage, which did improve prior to d/c and she was d/c'd on coumadin. 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 Mass was still present on 2D Echo 10/2011.  EF 50-55%.        Past Medical History  Diagnosis Date  . Hypertension   . Atrial fibrillation     normal coroonaries cath 2004. Dr Clarene [facility]   . Sick sinus syndrome     Dr Sharrell Ku. EP study negative for inducibel arrythmia. ? pacer since 2007  . Hyperlipidemia   . Arthritis   . Anxiety   . Diverticula of colon   . Pneumonia 2009    resolved.? OPD Rx  . Hemorrhage intraabdominal 03/17/2012    Past Surgical History  Procedure Date  . Pacemaker insertion   . Mitral valve replacement 1998    St Jude  . Appendectomy   . Tubal ligation   . Tee without cardioversion 09/23/2011    Procedure: TRANSESOPHAGEAL ECHOCARDIOGRAM (TEE);  Surgeon: Chrystie Nose;  Location: [facility] ENDOSCOPY;  Service: Cardiovascular;  Laterality: N/A;  . Cardiac valve replacement   . Cardiac catheterization   . Insert / replace / remove pacemaker  History reviewed. No pertinent family history. Social History:  reports that she has never smoked. She has never used smokeless tobacco. She reports that she does not drink alcohol or use illicit drugs.  Allergies:  Allergies  Allergen Reactions  . Codeine Nausea And Vomiting    Medications Prior to Admission  Medication Sig Dispense Refill  . Dexlansoprazole (DEXILANT) 30 MG capsule Take 30 mg by mouth daily.      . digoxin (LANOXIN) 0.125 MG  tablet Take 1 tablet (0.125 mg total) by mouth daily.  30 tablet  0  . famotidine (PEPCID) 20 MG tablet Take 20 mg by mouth 2 (two) times daily.        . metoprolol tartrate (LOPRESSOR) 25 MG tablet Take 1.5 tablets (37.5 mg total) by mouth 2 (two) times daily.  45 tablet  0  . warfarin (COUMADIN) 5 MG tablet Take 2.5 mg by mouth daily. 2.5 mg on Monday and 5 mg daily on other days        Results for orders placed during the hospital encounter of 03/16/12 (from the past 48 hour(s))  GLUCOSE, CAPILLARY     Status: Abnormal   Collection Time   03/16/12 11:26 PM      Component Value Range Comment   Glucose-Capillary 184 (*) 70 - 99 mg/dL   CBC WITH DIFFERENTIAL     Status: Abnormal   Collection Time   03/17/12 12:25 AM      Component Value Range Comment   WBC 20.5 (*) 4.0 - 10.5 K/uL    RBC 2.85 (*) 3.87 - 5.11 MIL/uL    Hemoglobin 8.2 (*) 12.0 - 15.0 g/dL    HCT 40.9 (*) 81.1 - 46.0 %    MCV 89.1  78.0 - 100.0 fL    MCH 28.8  26.0 - 34.0 pg    MCHC 32.3  30.0 - 36.0 g/dL    RDW 91.4 (*) 78.2 - 15.5 %    Platelets 244  150 - 400 K/uL    Neutrophils Relative 83 (*) 43 - 77 %    Lymphocytes Relative 11 (*) 12 - 46 %    Monocytes Relative 6  3 - 12 %    Eosinophils Relative 0  0 - 5 %    Basophils Relative 0  0 - 1 %    Neutro Abs 17.0 (*) 1.7 - 7.7 K/uL    Lymphs Abs 2.3  0.7 - 4.0 K/uL    Monocytes Absolute 1.2 (*) 0.1 - 1.0 K/uL    Eosinophils Absolute 0.0  0.0 - 0.7 K/uL    Basophils Absolute 0.0  0.0 - 0.1 K/uL    WBC Morphology TOXIC GRANULATION     COMPREHENSIVE METABOLIC PANEL     Status: Abnormal   Collection Time   03/17/12 12:25 AM      Component Value Range Comment   Sodium 134 (*) 135 - 145 mEq/L    Potassium 3.9  3.5 - 5.1 mEq/L    Chloride 103  96 - 112 mEq/L    CO2 19  19 - 32 mEq/L    Glucose, Bld 172 (*) 70 - 99 mg/dL    BUN 17  6 - 23 mg/dL    Creatinine, Ser 9.56  0.50 - 1.10 mg/dL    Calcium 9.3  8.4 - 21.3 mg/dL    Total Protein 7.1  6.0 - 8.3 g/dL     Albumin 3.1 (*) 3.5 - 5.2 g/dL    AST 086 (*)  0 - 37 U/L    ALT 57 (*) 0 - 35 U/L    Alkaline Phosphatase 116  39 - 117 U/L    Total Bilirubin 0.5  0.3 - 1.2 mg/dL    GFR calc non Af Amer 59 (*) >90 mL/min    GFR calc Af Amer 69 (*) >90 mL/min   LIPASE, BLOOD     Status: Abnormal   Collection Time   03/17/12 12:25 AM      Component Value Range Comment   Lipase 67 (*) 11 - 59 U/L   PROTIME-INR     Status: Abnormal   Collection Time   03/17/12 12:30 AM      Component Value Range Comment   Prothrombin Time 29.5 (*) 11.6 - 15.2 seconds    INR 2.75 (*) 0.00 - 1.49   DIGOXIN LEVEL     Status: Abnormal   Collection Time   03/17/12 12:30 AM      Component Value Range Comment   Digoxin Level 0.7 (*) 0.8 - 2.0 ng/mL   POCT I-STAT TROPONIN I     Status: Normal   Collection Time   03/17/12 12:40 AM      Component Value Range Comment   Troponin i, poc 0.01  0.00 - 0.08 ng/mL    Comment 3            URINALYSIS, ROUTINE W REFLEX MICROSCOPIC     Status: Abnormal   Collection Time   03/17/12  3:57 AM      Component Value Range Comment   Color, Urine YELLOW  YELLOW    APPearance CLOUDY (*) CLEAR    Specific Gravity, Urine 1.018  1.005 - 1.030    pH 6.0  5.0 - 8.0    Glucose, UA NEGATIVE  NEGATIVE mg/dL    Hgb urine dipstick NEGATIVE  NEGATIVE    Bilirubin Urine NEGATIVE  NEGATIVE    Ketones, ur NEGATIVE  NEGATIVE mg/dL    Protein, ur 161 (*) NEGATIVE mg/dL    Urobilinogen, UA 1.0  0.0 - 1.0 mg/dL    Nitrite NEGATIVE  NEGATIVE    Leukocytes, UA MODERATE (*) NEGATIVE   URINE MICROSCOPIC-ADD ON     Status: Abnormal   Collection Time   03/17/12  3:57 AM      Component Value Range Comment   Squamous Epithelial / LPF MANY (*) RARE    WBC, UA 0-2  <3 WBC/hpf    RBC / HPF 0-2  <3 RBC/hpf    Bacteria, UA RARE  RARE    Urine-Other MUCOUS PRESENT     PREPARE FRESH FROZEN PLASMA     Status: Normal (Preliminary result)   Collection Time   03/17/12  6:30 AM      Component Value Range Comment    Unit Number [id number]      Blood Component Type THAWED PLASMA      Unit division 00      Status of Unit ISSUED      Transfusion Status OK TO TRANSFUSE      Unit Number [id number]      Blood Component Type THAWED PLASMA      Unit division 00      Status of Unit ISSUED      Transfusion Status OK TO TRANSFUSE     TYPE AND SCREEN     Status: Normal   Collection Time   03/17/12  6:35 AM      Component Value Range Comment  ABO/RH(D) A POS      Antibody Screen NEG      Sample Expiration 03/20/2012     MRSA PCR SCREENING     Status: Normal   Collection Time   03/17/12  8:30 AM      Component Value Range Comment   MRSA by PCR NEGATIVE  NEGATIVE    Ct Abdomen Pelvis W Contrast  03/17/2012  *RADIOLOGY REPORT*  Clinical Data: Generalized abdominal pain, nausea and vomiting.  CT ABDOMEN AND PELVIS WITH CONTRAST  Technique:  Multidetector CT imaging of the abdomen and pelvis was performed following the standard protocol during bolus administration of intravenous contrast.  Contrast: OMNIPAQUE IOHEXOL 300 MG/ML  SOLN  Comparison: None.  Findings: Atelectasis or infiltration in the lung bases.  Diffuse cardiac enlargement.  Mitral valve replacement.  Coronary artery calcifications.  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  Focal contrast collections in the left lobe of the liver centrally measuring 2.4 x 3.9 cm with multiple complex cystic changes in the lateral segment of the left lobe.  The cystic changes could represent a complex cyst such as hemorrhagic or infectious cysts versus local bile duct dilatation versus mass lesions.  The contrast collection to suggest active extravasation which could be due to pseudoaneurysm, hemorrhagic tumor, or arterial portal shunt. There is a moderately large amount of complex fluid in the upper abdomen suggesting peritoneal hemorrhage.  Fluid extends down along the pericolic gutters bilaterally into  the pelvis and in the inguinal canals via small hernias.  The gallbladder, pancreas and adrenal glands are unremarkable. Calcification of the abdominal aorta without aneurysm.  No retroperitoneal fluid collections.  Scarring in the renal parenchyma with small sub centimeter parenchymal cysts.  No hydronephrosis or solid renal mass lesions.  There is infiltrative change in the mesenteric, likely resulting from the peritoneal fluid although mesenteric metastasis is not excluded.  The stomach, small bowel, and colon are not abnormally distended.  No free air in the abdomen.  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  No abnormal adnexal masses.  Bladder wall is not thickened.  Hemorrhagic fluid in the pelvis as previously discussed.  Scattered diverticula without diverticulitis.  The appendix appears normal.  Degenerative changes in the spine.  IMPRESSION: Contrast collection in the left lobe of the liver suggesting focus of active extravasation likely due to pseudoaneurysm versus arterial portal shunting versus hemorrhagic tumor.  Cystic changes in the lateral segment of the left lobe may represent mass lesion, bile duct dilatation, or hematomas.  Large amount of free hemorrhage throughout the peritoneum.  Critical Value/emergent results were called by telephone at the time of interpretation on 03/17/2012  at 0546 hours  to  Dr. Orson Slick, who verbally acknowledged these results.  Original Report Authenticated By: Marlon Pel, M.D.   Dg Abd Acute W/chest  03/17/2012  *RADIOLOGY REPORT*  Clinical Data: Mid abdominal pain and nausea.  ACUTE ABDOMEN SERIES (ABDOMEN 2 VIEW & CHEST 1 VIEW)  Comparison: Chest 09/29/2011  Findings: Stable appearance of cardiac pacemaker and postoperative changes in the mediastinum.  Shallow inspiration.  Cardiac enlargement with normal pulmonary vascularity for technique. Linear atelectasis or fibrosis in the lung bases.  No focal consolidation.  No blunting of costophrenic angles.   No pneumothorax.  Gas and stool scattered throughout the colon.  No small or large bowel distension.  No free intra-abdominal air.  No abnormal air fluid levels.  No radiopaque stones.  Mild  degenerative changes in the spine and hips.  IMPRESSION: Shallow inspiration with linear atelectasis or fibrosis in the lung bases, stable since previous study.  Nonobstructive bowel gas pattern.  Original Report Authenticated By: Marlon Pel, M.D.    ION:[id number] to obtain Blood pressure 158/78, pulse 62, temperature 98 F (36.7 C), temperature source Oral, resp. rate 23, SpO2 98.00%. PE: unable to obtain   Assessment/Plan Patient Active Problem List  Diagnosis  . CONSTIPATION  . S/P mitral valve replacement, St Jude  . Pneumococcal septicemia  . Muscle weakness of lower extremity  . Presence of permanent cardiac pacemaker  . Chronic anticoagulation, ( INR goal 2.5-3.5 under normal circumstances for ST Jude MVR)  . Endocarditis of prosthetic valve  . A-fib  . Hemorrhage intraabdominal   PLAN: May need repeat TEE to see how mass on mechanical valve is at this time.  Will need anticoagulation.  Currently in procedure to contol bleeding.  We will follow along.  INGOLD,LAURA R 03/17/2012, 12:48 PM     Agree with note written by Nada Boozer RNP  Pt well known to our service with H/O CAF and ST Jude MVR on chronic coumadin a/c. She had SBE in Jan treated with ATBX. She has been having N/V and abd pain, worse today.Abd CT revealed large intra hepatic hematoma and Hgb markedly decreased with therapeutic INR . She underwent successful coil embolization of a branch of her hepatic artery by Dr Bonnielee Haff with resolution of the bleeding. Currently feels sympotmatically improved getting transfusion of 2 u PRBCs. Exam notable for crisp VS with outflow murmer and apical SEM. Lungs clear. Abd soft, minimally tender w/o bowel sounds. I have discussed re-anticoagulation with Dr. Bonnielee Haff who felt comfortable  Starting IV  heparin w/o a bolus. She needs to be on coumadin for her CAF and MVR. Will follow along with you.  Runell Gess, M.D., [facility] [address] [place], [place]  [address]  [phone] 03/17/2012 4:20 PM

## 2012-03-17 NOTE — ED Notes (Signed)
Pt resting in bed, alert and oriented, pt color has improved, c/o abd pain, pt skin remains cool, blankets applied

## 2012-03-17 NOTE — ED Notes (Signed)
After ambulating to bathroom, pt began to feel weak and is noted to be pale and diaphoretic, BP 78/40, pt assisted back to bed, laying flat, VS improved. MD notified. Pt remains on monitor.

## 2012-03-18 DIAGNOSIS — I4891 Unspecified atrial fibrillation: Secondary | ICD-10-CM

## 2012-03-18 LAB — CBC
Hemoglobin: 7.6 g/dL — ABNORMAL LOW (ref 12.0–15.0)
MCV: 86.7 fL (ref 78.0–100.0)
Platelets: 160 10*3/uL (ref 150–400)
Platelets: 175 10*3/uL (ref 150–400)
Platelets: 176 10*3/uL (ref 150–400)
RBC: 2.55 MIL/uL — ABNORMAL LOW (ref 3.87–5.11)
RBC: 2.58 MIL/uL — ABNORMAL LOW (ref 3.87–5.11)
RBC: 3.49 MIL/uL — ABNORMAL LOW (ref 3.87–5.11)
RDW: 17.5 % — ABNORMAL HIGH (ref 11.5–15.5)
RDW: 16.4 % — ABNORMAL HIGH (ref 11.5–15.5)
WBC: 16.1 10*3/uL — ABNORMAL HIGH (ref 4.0–10.5)
WBC: 15.4 10*3/uL — ABNORMAL HIGH (ref 4.0–10.5)
WBC: 15.6 10*3/uL — ABNORMAL HIGH (ref 4.0–10.5)

## 2012-03-18 LAB — PROCALCITONIN: Procalcitonin: 0.17 ng/mL

## 2012-03-18 LAB — PREPARE FRESH FROZEN PLASMA: Unit division: 0

## 2012-03-18 LAB — BASIC METABOLIC PANEL
Calcium: 8.5 mg/dL (ref 8.4–10.5)
GFR calc Af Amer: >90 mL/min (ref >90)
GFR calc non Af Amer: 83 mL/min — ABNORMAL LOW (ref >90)
Glucose, Bld: 132 mg/dL — ABNORMAL HIGH (ref 70–99)
Potassium: 3.8 mEq/L (ref 3.5–5.1)
Sodium: 133 mEq/L — ABNORMAL LOW (ref 135–145)

## 2012-03-18 LAB — URINE CULTURE: Culture  Setup Time: 201306270922

## 2012-03-18 LAB — GLUCOSE, CAPILLARY: Glucose-Capillary: 120 mg/dL — ABNORMAL HIGH (ref 70–99)

## 2012-03-18 LAB — PROTIME-INR: Prothrombin Time: 16.6 seconds — ABNORMAL HIGH (ref 11.6–15.2)

## 2012-03-18 LAB — HEPARIN LEVEL (UNFRACTIONATED)
Heparin Unfractionated: 0.31 IU/mL (ref 0.30–0.70)
Heparin Unfractionated: 0.36 IU/mL (ref 0.30–0.70)

## 2012-03-18 MED ORDER — LORAZEPAM 0.5 MG PO TABS
ORAL_TABLET | ORAL | Status: AC
  Filled 2012-03-18: qty 1

## 2012-03-18 MED ORDER — LORAZEPAM 0.5 MG PO TABS
0.5000 mg | ORAL_TABLET | Freq: Once | ORAL | Status: AC
  Administered 2012-03-18: 0.5 mg via ORAL

## 2012-03-18 MED ORDER — FUROSEMIDE 10 MG/ML IJ SOLN
20.0000 mg | Freq: Once | INTRAMUSCULAR | Status: AC
  Administered 2012-03-18: 20 mg via INTRAVENOUS
  Filled 2012-03-18: qty 2

## 2012-03-18 NOTE — Progress Notes (Signed)
Subjective: Feels a little better today. No nausea. Still SOB but not as bad as yesterday  Objective: Vital signs in last 24 hours: Temp:  [97.4 F (36.3 C)-102.8 F (39.3 C)] 98.9 F (37.2 C) (06/28 0800) Pulse Rate:  [62-101] 101  (06/28 0600) Resp:  [18-32] 25  (06/28 0600) BP: (116-176)/(39-87) 145/87 mmHg (06/28 0600) SpO2:  [0 %-100 %] 95 % (06/28 0600) Weight:  [134 lb 11.2 oz (61.1 kg)] 134 lb 11.2 oz (61.1 kg) (06/28 0400) Last BM Date: 03/16/12  Intake/Output from previous day: 06/27 0701 - 06/28 0700 In: 2786.8 [P.O.:360; I.V.:1439.3; Blood:937.5; IV Piggyback:50] Out: 2455 [Urine:2455] Intake/Output this shift:    GI: soft, mild diffuse tenderness. positive bs  Lab Results:   Basename 03/18/12 0200 03/17/12 2200  WBC 16.1* 14.1*  HGB 7.6* 7.7*  HCT 22.1* 22.8*  PLT 160 170   BMET  Basename 03/18/12 0200 03/17/12 0025  NA 133* 134*  K 3.8 3.9  CL 103 103  CO2 23 19  GLUCOSE 132* 172*  BUN 9 17  CREATININE 0.76 0.95  CALCIUM 8.5 9.3   PT/INR  Basename 03/17/12 2315 03/17/12 1215  LABPROT 16.7* 16.7*  INR 1.33 1.33   ABG No results found for this basename: PHART:2,PCO2:2,PO2:2,HCO3:2 in the last 72 hours  Studies/Results: Ct Abdomen Pelvis W Contrast  03/17/2012  *RADIOLOGY REPORT*  Clinical Data: Generalized abdominal pain, nausea and vomiting.  CT ABDOMEN AND PELVIS WITH CONTRAST  Technique:  Multidetector CT imaging of the abdomen and pelvis was performed following the standard protocol during bolus administration of intravenous contrast.  Contrast: OMNIPAQUE IOHEXOL 300 MG/ML  SOLN  Comparison: None.  Findings: Atelectasis or infiltration in the lung bases.  Diffuse cardiac enlargement.  Mitral valve replacement.  Coronary artery calcifications.  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  Focal contrast collections in the left lobe of the liver centrally measuring 2.4 x 3.9  cm with multiple complex cystic changes in the lateral segment of the left lobe.  The cystic changes could represent a complex cyst such as hemorrhagic or infectious cysts versus local bile duct dilatation versus mass lesions.  The contrast collection to suggest active extravasation which could be due to pseudoaneurysm, hemorrhagic tumor, or arterial portal shunt. There is a moderately large amount of complex fluid in the upper abdomen suggesting peritoneal hemorrhage.  Fluid extends down along the pericolic gutters bilaterally into the pelvis and in the inguinal canals via small hernias.  The gallbladder, pancreas and adrenal glands are unremarkable. Calcification of the abdominal aorta without aneurysm.  No retroperitoneal fluid collections.  Scarring in the renal parenchyma with small sub centimeter parenchymal cysts.  No hydronephrosis or solid renal mass lesions.  There is infiltrative change in the mesenteric, likely resulting from the peritoneal fluid although mesenteric metastasis is not excluded.  The stomach, small bowel, and colon are not abnormally distended.  No free air in the abdomen.  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  No abnormal adnexal masses.  Bladder wall is not thickened.  Hemorrhagic fluid in the pelvis as previously discussed.  Scattered diverticula without diverticulitis.  The appendix appears normal.  Degenerative changes in the spine.  IMPRESSION: Contrast collection in the left lobe of the liver suggesting focus of active extravasation likely due to pseudoaneurysm versus arterial portal shunting versus hemorrhagic tumor.  Cystic changes in the lateral segment of the left lobe may represent mass lesion, bile duct dilatation, or  hematomas.  Large amount of free hemorrhage throughout the peritoneum.  Critical Value/emergent results were called by telephone at the time of interpretation on 03/17/2012  at 0546 hours  to  Dr. Orson Slick, who verbally acknowledged these results.  Original  Report Authenticated By: Marlon Pel, M.D.   Ir Angiogram Visceral Selective  03/17/2012  *RADIOLOGY REPORT*  Clinical Data/Indication: LIVER INJURY.  ACTIVE EXTRAVASATION FROM AN INJURY IN THE LEFT LOBE.  TRANSCATHETER THERAPY EMBOLIZATION,ARTERIOGRAPHY,ADDITIONAL ARTERIOGRAPHY,IR ULTRASOUND GUIDANCE VASC ACCESS RIGHT,SELECTIVE VISCERAL ARTERIOGRAPHY  Sedation: Versed 2 mg, Fentanyl 100 mg.  Total Moderate Sedation Time: 100 minutes.  Contrast Volume: 61 ml Omnipaque-300.  Additional Medications: Cefazolin 1 gram.  Fluoroscopy Time: 9.9 minutes.  Procedure: The procedure, risks, benefits, and alternatives were explained to the patient. Questions regarding the procedure were encouraged and answered. The patient understands and consents to the procedure.  The right groin was prepped with betadine in a sterile fashion, and a sterile drape was applied covering the operative field. A sterile gown and sterile gloves were used for the procedure.  Under sonographic guidance, a micropuncture needle was inserted into the right common femoral artery and removed over a 0018 wire. This was upsized to a [place].  5-French sheath was inserted.  A Cobra II catheter was inserted.  The celiac axis was selected over the [facility] wire and angiography was performed.  The catheter was advanced into the hepatic artery. A Progreat micro catheter was advanced into the left hepatic artery and imaging was performed demonstrating prompt filling of the large pseudoaneurysm.  Three 4 mm platinum embolization coils within deployed in the distal main left hepatic artery.  Sub selection into the medial segment branch was not performed because the vessel looked markedly abnormal and embolization proximal to the bifurcation would be required for ensuring successful embolization proximal to the injury site.  Repeat angiography was performed in the left hepatic artery.  The right hepatic artery was selected over a micro wire. Angiography was  performed.  The segmental branch to the anterior segment of the right lobe of the liver was selected and subsequent angiography was performed.  Filling of the pseudoaneurysm from the right hepatic artery branches could not be demonstrated.  The micro catheter was then re-advanced into the left hepatic artery and angiography confirms occlusion of the vessel and no filling of the pseudoaneurysm.  The micro catheter was removed.  Injection through the [place] II catheter position in the main hepatic artery was then also performed failing to demonstrate filling of the aneurysm.  The catheter and sheath were removed.  Hemostasis was achieved direct pressure.  Findings: Initial imaging demonstrates expected branching anatomy of the common pedicle artery.  Splenic artery does not fill.  The patient has a very atrophic spleen on imaging. 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  Selective injection of the left hepatic artery confirms filling of the large pseudoaneurysm.  After embolization, there is markedly reduced flow within the left hepatic artery.  Selective injection of the right hepatic artery and anterior segmental branch demonstrates no evidence of filling of the large pseudoaneurysm or contrast extravasation.  Repeat injection of the left back artery demonstrates successful occlusion of this vessel.  Final injection through the Cobra II catheter in the common hepatic artery demonstrates resolution of the pseudoaneurysm.  Complications: None.  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  The main left hepatic artery was occluded at the branch  point into the medial and lateral segments.  Original Report Authenticated By: Donavan Burnet, M.D.   Ir Angiogram Selective Each Additional Vessel  03/17/2012  *RADIOLOGY REPORT*  Clinical Data/Indication: LIVER INJURY.  ACTIVE EXTRAVASATION FROM AN INJURY IN THE LEFT LOBE.  TRANSCATHETER  THERAPY EMBOLIZATION,ARTERIOGRAPHY,ADDITIONAL ARTERIOGRAPHY,IR ULTRASOUND GUIDANCE VASC ACCESS RIGHT,SELECTIVE VISCERAL ARTERIOGRAPHY  Sedation: Versed 2 mg, Fentanyl 100 mg.  Total Moderate Sedation Time: 100 minutes.  Contrast Volume: 61 ml Omnipaque-300.  Additional Medications: Cefazolin 1 gram.  Fluoroscopy Time: 9.9 minutes.  Procedure: The procedure, risks, benefits, and alternatives were explained to the patient. Questions regarding the procedure were encouraged and answered. The patient understands and consents to the procedure.  The right groin was prepped with betadine in a sterile fashion, and a sterile drape was applied covering the operative field. A sterile gown and sterile gloves were used for the procedure.  Under sonographic guidance, a micropuncture needle was inserted into the right common femoral artery and removed over a 0018 wire. This was upsized to a [place].  5-French sheath was inserted.  A Cobra II catheter was inserted.  The celiac axis was selected over the [facility] wire and angiography was performed.  The catheter was advanced into the hepatic artery. A Progreat micro catheter was advanced into the left hepatic artery and imaging was performed demonstrating prompt filling of the large pseudoaneurysm.  Three 4 mm platinum embolization coils within deployed in the distal main left hepatic artery.  Sub selection into the medial segment branch was not performed because the vessel looked markedly abnormal and embolization proximal to the bifurcation would be required for ensuring successful embolization proximal to the injury site.  Repeat angiography was performed in the left hepatic artery.  The right hepatic artery was selected over a micro wire. Angiography was performed.  The segmental branch to the anterior segment of the right lobe of the liver was selected and subsequent angiography was performed.  Filling of the pseudoaneurysm from the right hepatic artery branches could not be  demonstrated.  The micro catheter was then re-advanced into the left hepatic artery and angiography confirms occlusion of the vessel and no filling of the pseudoaneurysm.  The micro catheter was removed.  Injection through the [place] II catheter position in the main hepatic artery was then also performed failing to demonstrate filling of the aneurysm.  The catheter and sheath were removed.  Hemostasis was achieved direct pressure.  Findings: Initial imaging demonstrates expected branching anatomy of the common pedicle artery.  Splenic artery does not fill.  The patient has a very atrophic spleen on imaging. 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  Selective injection of the left hepatic artery confirms filling of the large pseudoaneurysm.  After embolization, there is markedly reduced flow within the left hepatic artery.  Selective injection of the right hepatic artery and anterior segmental branch demonstrates no evidence of filling of the large pseudoaneurysm or contrast extravasation.  Repeat injection of the left back artery demonstrates successful occlusion of this vessel.  Final injection through the Cobra II catheter in the common hepatic artery demonstrates resolution of the pseudoaneurysm.  Complications: None.  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  The main left hepatic artery was occluded at the branch point into the medial and lateral segments.  Original Report Authenticated By: Donavan Burnet, M.D.   Ir Angiogram Selective Each Additional Vessel  03/17/2012  *RADIOLOGY REPORT*  Clinical Data/Indication: LIVER INJURY.  ACTIVE EXTRAVASATION FROM AN INJURY IN THE LEFT LOBE.  TRANSCATHETER THERAPY EMBOLIZATION,ARTERIOGRAPHY,ADDITIONAL ARTERIOGRAPHY,IR ULTRASOUND GUIDANCE VASC ACCESS RIGHT,SELECTIVE VISCERAL ARTERIOGRAPHY  Sedation: Versed 2 mg, Fentanyl 100 mg.  Total Moderate Sedation Time: 100 minutes.  Contrast  Volume: 61 ml Omnipaque-300.  Additional Medications: Cefazolin 1 gram.  Fluoroscopy Time: 9.9 minutes.  Procedure: The procedure, risks, benefits, and alternatives were explained to the patient. Questions regarding the procedure were encouraged and answered. The patient understands and consents to the procedure.  The right groin was prepped with betadine in a sterile fashion, and a sterile drape was applied covering the operative field. A sterile gown and sterile gloves were used for the procedure.  Under sonographic guidance, a micropuncture needle was inserted into the right common femoral artery and removed over a 0018 wire. This was upsized to a [place].  5-French sheath was inserted.  A Cobra II catheter was inserted.  The celiac axis was selected over the [facility] wire and angiography was performed.  The catheter was advanced into the hepatic artery. A Progreat micro catheter was advanced into the left hepatic artery and imaging was performed demonstrating prompt filling of the large pseudoaneurysm.  Three 4 mm platinum embolization coils within deployed in the distal main left hepatic artery.  Sub selection into the medial segment branch was not performed because the vessel looked markedly abnormal and embolization proximal to the bifurcation would be required for ensuring successful embolization proximal to the injury site.  Repeat angiography was performed in the left hepatic artery.  The right hepatic artery was selected over a micro wire. Angiography was performed.  The segmental branch to the anterior segment of the right lobe of the liver was selected and subsequent angiography was performed.  Filling of the pseudoaneurysm from the right hepatic artery branches could not be demonstrated.  The micro catheter was then re-advanced into the left hepatic artery and angiography confirms occlusion of the vessel and no filling of the pseudoaneurysm.  The micro catheter was removed.  Injection through the [place] II  catheter position in the main hepatic artery was then also performed failing to demonstrate filling of the aneurysm.  The catheter and sheath were removed.  Hemostasis was achieved direct pressure.  Findings: Initial imaging demonstrates expected branching anatomy of the common pedicle artery.  Splenic artery does not fill.  The patient has a very atrophic spleen on imaging. 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  Selective injection of the left hepatic artery confirms filling of the large pseudoaneurysm.  After embolization, there is markedly reduced flow within the left hepatic artery.  Selective injection of the right hepatic artery and anterior segmental branch demonstrates no evidence of filling of the large pseudoaneurysm or contrast extravasation.  Repeat injection of the left back artery demonstrates successful occlusion of this vessel.  Final injection through the Cobra II catheter in the common hepatic artery demonstrates resolution of the pseudoaneurysm.  Complications: None.  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  The main left hepatic artery was occluded at the branch point into the medial and lateral segments.  Original Report Authenticated By: Donavan Burnet, M.D.   Ir Angiogram Selective Each Additional Vessel  03/17/2012  *RADIOLOGY REPORT*  Clinical Data/Indication: LIVER INJURY.  ACTIVE EXTRAVASATION FROM AN INJURY IN THE LEFT LOBE.  TRANSCATHETER THERAPY EMBOLIZATION,ARTERIOGRAPHY,ADDITIONAL ARTERIOGRAPHY,IR ULTRASOUND GUIDANCE VASC ACCESS RIGHT,SELECTIVE VISCERAL ARTERIOGRAPHY  Sedation: Versed 2  mg, Fentanyl 100 mg.  Total Moderate Sedation Time: 100 minutes.  Contrast Volume: 61 ml Omnipaque-300.  Additional Medications: Cefazolin 1 gram.  Fluoroscopy Time: 9.9 minutes.  Procedure: The procedure, risks, benefits, and alternatives were explained to the patient. Questions regarding the procedure were  encouraged and answered. The patient understands and consents to the procedure.  The right groin was prepped with betadine in a sterile fashion, and a sterile drape was applied covering the operative field. A sterile gown and sterile gloves were used for the procedure.  Under sonographic guidance, a micropuncture needle was inserted into the right common femoral artery and removed over a 0018 wire. This was upsized to a [place].  5-French sheath was inserted.  A Cobra II catheter was inserted.  The celiac axis was selected over the [facility] wire and angiography was performed.  The catheter was advanced into the hepatic artery. A Progreat micro catheter was advanced into the left hepatic artery and imaging was performed demonstrating prompt filling of the large pseudoaneurysm.  Three 4 mm platinum embolization coils within deployed in the distal main left hepatic artery.  Sub selection into the medial segment branch was not performed because the vessel looked markedly abnormal and embolization proximal to the bifurcation would be required for ensuring successful embolization proximal to the injury site.  Repeat angiography was performed in the left hepatic artery.  The right hepatic artery was selected over a micro wire. Angiography was performed.  The segmental branch to the anterior segment of the right lobe of the liver was selected and subsequent angiography was performed.  Filling of the pseudoaneurysm from the right hepatic artery branches could not be demonstrated.  The micro catheter was then re-advanced into the left hepatic artery and angiography confirms occlusion of the vessel and no filling of the pseudoaneurysm.  The micro catheter was removed.  Injection through the [place] II catheter position in the main hepatic artery was then also performed failing to demonstrate filling of the aneurysm.  The catheter and sheath were removed.  Hemostasis was achieved direct pressure.  Findings: Initial imaging  demonstrates expected branching anatomy of the common pedicle artery.  Splenic artery does not fill.  The patient has a very atrophic spleen on imaging. 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  Selective injection of the left hepatic artery confirms filling of the large pseudoaneurysm.  After embolization, there is markedly reduced flow within the left hepatic artery.  Selective injection of the right hepatic artery and anterior segmental branch demonstrates no evidence of filling of the large pseudoaneurysm or contrast extravasation.  Repeat injection of the left back artery demonstrates successful occlusion of this vessel.  Final injection through the Cobra II catheter in the common hepatic artery demonstrates resolution of the pseudoaneurysm.  Complications: None.  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  The main left hepatic artery was occluded at the branch point into the medial and lateral segments.  Original Report Authenticated By: Donavan Burnet, M.D.   Ir Transcath/emboliz  03/17/2012  *RADIOLOGY REPORT*  Clinical Data/Indication: LIVER INJURY.  ACTIVE EXTRAVASATION FROM AN INJURY IN THE LEFT LOBE.  TRANSCATHETER THERAPY EMBOLIZATION,ARTERIOGRAPHY,ADDITIONAL ARTERIOGRAPHY,IR ULTRASOUND GUIDANCE VASC ACCESS RIGHT,SELECTIVE VISCERAL ARTERIOGRAPHY  Sedation: Versed 2 mg, Fentanyl 100 mg.  Total Moderate Sedation Time: 100 minutes.  Contrast Volume: 61 ml Omnipaque-300.  Additional Medications: Cefazolin 1 gram.  Fluoroscopy Time: 9.9 minutes.  Procedure: The procedure, risks, benefits,  and alternatives were explained to the patient. Questions regarding the procedure were encouraged and answered. The patient understands and consents to the procedure.  The right groin was prepped with betadine in a sterile fashion, and a sterile drape was applied covering the operative field. A sterile gown and sterile gloves were used  for the procedure.  Under sonographic guidance, a micropuncture needle was inserted into the right common femoral artery and removed over a 0018 wire. This was upsized to a [place].  5-French sheath was inserted.  A Cobra II catheter was inserted.  The celiac axis was selected over the [facility] wire and angiography was performed.  The catheter was advanced into the hepatic artery. A Progreat micro catheter was advanced into the left hepatic artery and imaging was performed demonstrating prompt filling of the large pseudoaneurysm.  Three 4 mm platinum embolization coils within deployed in the distal main left hepatic artery.  Sub selection into the medial segment branch was not performed because the vessel looked markedly abnormal and embolization proximal to the bifurcation would be required for ensuring successful embolization proximal to the injury site.  Repeat angiography was performed in the left hepatic artery.  The right hepatic artery was selected over a micro wire. Angiography was performed.  The segmental branch to the anterior segment of the right lobe of the liver was selected and subsequent angiography was performed.  Filling of the pseudoaneurysm from the right hepatic artery branches could not be demonstrated.  The micro catheter was then re-advanced into the left hepatic artery and angiography confirms occlusion of the vessel and no filling of the pseudoaneurysm.  The micro catheter was removed.  Injection through the [place] II catheter position in the main hepatic artery was then also performed failing to demonstrate filling of the aneurysm.  The catheter and sheath were removed.  Hemostasis was achieved direct pressure.  Findings: Initial imaging demonstrates expected branching anatomy of the common pedicle artery.  Splenic artery does not fill.  The patient has a very atrophic spleen on imaging. 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  Selective injection of the  left hepatic artery confirms filling of the large pseudoaneurysm.  After embolization, there is markedly reduced flow within the left hepatic artery.  Selective injection of the right hepatic artery and anterior segmental branch demonstrates no evidence of filling of the large pseudoaneurysm or contrast extravasation.  Repeat injection of the left back artery demonstrates successful occlusion of this vessel.  Final injection through the Cobra II catheter in the common hepatic artery demonstrates resolution of the pseudoaneurysm.  Complications: None.  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  The main left hepatic artery was occluded at the branch point into the medial and lateral segments.  Original Report Authenticated By: Donavan Burnet, M.D.   Ir Angiogram Follow Up Study  03/17/2012  *RADIOLOGY REPORT*  Clinical Data/Indication: LIVER INJURY.  ACTIVE EXTRAVASATION FROM AN INJURY IN THE LEFT LOBE.  TRANSCATHETER THERAPY EMBOLIZATION,ARTERIOGRAPHY,ADDITIONAL ARTERIOGRAPHY,IR ULTRASOUND GUIDANCE VASC ACCESS RIGHT,SELECTIVE VISCERAL ARTERIOGRAPHY  Sedation: Versed 2 mg, Fentanyl 100 mg.  Total Moderate Sedation Time: 100 minutes.  Contrast Volume: 61 ml Omnipaque-300.  Additional Medications: Cefazolin 1 gram.  Fluoroscopy Time: 9.9 minutes.  Procedure: The procedure, risks, benefits, and alternatives were explained to the patient. Questions regarding the procedure were encouraged and answered. The patient understands and consents to the procedure.  The right groin was prepped with betadine  in a sterile fashion, and a sterile drape was applied covering the operative field. A sterile gown and sterile gloves were used for the procedure.  Under sonographic guidance, a micropuncture needle was inserted into the right common femoral artery and removed over a 0018 wire. This was upsized to a [place].  5-French sheath was inserted.  A Cobra II catheter was inserted.   The celiac axis was selected over the [facility] wire and angiography was performed.  The catheter was advanced into the hepatic artery. A Progreat micro catheter was advanced into the left hepatic artery and imaging was performed demonstrating prompt filling of the large pseudoaneurysm.  Three 4 mm platinum embolization coils within deployed in the distal main left hepatic artery.  Sub selection into the medial segment branch was not performed because the vessel looked markedly abnormal and embolization proximal to the bifurcation would be required for ensuring successful embolization proximal to the injury site.  Repeat angiography was performed in the left hepatic artery.  The right hepatic artery was selected over a micro wire. Angiography was performed.  The segmental branch to the anterior segment of the right lobe of the liver was selected and subsequent angiography was performed.  Filling of the pseudoaneurysm from the right hepatic artery branches could not be demonstrated.  The micro catheter was then re-advanced into the left hepatic artery and angiography confirms occlusion of the vessel and no filling of the pseudoaneurysm.  The micro catheter was removed.  Injection through the [place] II catheter position in the main hepatic artery was then also performed failing to demonstrate filling of the aneurysm.  The catheter and sheath were removed.  Hemostasis was achieved direct pressure.  Findings: Initial imaging demonstrates expected branching anatomy of the common pedicle artery.  Splenic artery does not fill.  The patient has a very atrophic spleen on imaging. 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  Selective injection of the left hepatic artery confirms filling of the large pseudoaneurysm.  After embolization, there is markedly reduced flow within the left hepatic artery.  Selective injection of the right hepatic artery and anterior segmental branch demonstrates no  evidence of filling of the large pseudoaneurysm or contrast extravasation.  Repeat injection of the left back artery demonstrates successful occlusion of this vessel.  Final injection through the Cobra II catheter in the common hepatic artery demonstrates resolution of the pseudoaneurysm.  Complications: None.  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  The main left hepatic artery was occluded at the branch point into the medial and lateral segments.  Original Report Authenticated By: Donavan Burnet, M.D.   Ir Fluoro Guide Cv Line Right  03/17/2012  *RADIOLOGY REPORT*  Clinical Data: Peritoneal hemorrhage  PICC LINE PLACEMENT WITH ULTRASOUND AND FLUOROSCOPIC  GUIDANCE  Fluoroscopy Time: 0.2 minutes.  The right arm was prepped with chlorhexidine, draped in the usual sterile fashion using maximum barrier technique (cap and mask, sterile gown, sterile gloves, large sterile sheet, hand hygiene and cutaneous antisepsis) and infiltrated locally with 1% Lidocaine.  Ultrasound demonstrated patency of the right basilic vein, and this was documented with an image.  Under real-time ultrasound guidance, this vein was accessed with a 21 gauge micropuncture needle and image documentation was performed.  The needle was exchanged over a guidewire for a peel-away sheath through which a five [place] double lumen PICC trimmed to 32 cm was advanced, positioned with its tip at  the lower SVC/right atrial junction.  Fluoroscopy during the procedure and fluoro spot radiograph confirms appropriate catheter position.  The catheter was flushed, secured to the skin with Prolene sutures, and covered with a sterile dressing.  Complications:  None  IMPRESSION: Successful right arm PICC line placement with ultrasound and fluoroscopic guidance.  The catheter is ready for use.  Original Report Authenticated By: Donavan Burnet, M.D.   Ir US Guide Vasc Access Right  03/17/2012  *RADIOLOGY  REPORT*  Clinical Data: Peritoneal hemorrhage  PICC LINE PLACEMENT WITH ULTRASOUND AND FLUOROSCOPIC  GUIDANCE  Fluoroscopy Time: 0.2 minutes.  The right arm was prepped with chlorhexidine, draped in the usual sterile fashion using maximum barrier technique (cap and mask, sterile gown, sterile gloves, large sterile sheet, hand hygiene and cutaneous antisepsis) and infiltrated locally with 1% Lidocaine.  Ultrasound demonstrated patency of the right basilic vein, and this was documented with an image.  Under real-time ultrasound guidance, this vein was accessed with a 21 gauge micropuncture needle and image documentation was performed.  The needle was exchanged over a guidewire for a peel-away sheath through which a five [place] double lumen PICC trimmed to 32 cm was advanced, positioned with its tip at the lower SVC/right atrial junction.  Fluoroscopy during the procedure and fluoro spot radiograph confirms appropriate catheter position.  The catheter was flushed, secured to the skin with Prolene sutures, and covered with a sterile dressing.  Complications:  None  IMPRESSION: Successful right arm PICC line placement with ultrasound and fluoroscopic guidance.  The catheter is ready for use.  Original Report Authenticated By: Donavan Burnet, M.D.   Ir US Guide Vasc Access Right  03/17/2012  *RADIOLOGY REPORT*  Clinical Data/Indication: LIVER INJURY.  ACTIVE EXTRAVASATION FROM AN INJURY IN THE LEFT LOBE.  TRANSCATHETER THERAPY EMBOLIZATION,ARTERIOGRAPHY,ADDITIONAL ARTERIOGRAPHY,IR ULTRASOUND GUIDANCE VASC ACCESS RIGHT,SELECTIVE VISCERAL ARTERIOGRAPHY  Sedation: Versed 2 mg, Fentanyl 100 mg.  Total Moderate Sedation Time: 100 minutes.  Contrast Volume: 61 ml Omnipaque-300.  Additional Medications: Cefazolin 1 gram.  Fluoroscopy Time: 9.9 minutes.  Procedure: The procedure, risks, benefits, and alternatives were explained to the patient. Questions regarding the procedure were encouraged and answered. The patient  understands and consents to the procedure.  The right groin was prepped with betadine in a sterile fashion, and a sterile drape was applied covering the operative field. A sterile gown and sterile gloves were used for the procedure.  Under sonographic guidance, a micropuncture needle was inserted into the right common femoral artery and removed over a 0018 wire. This was upsized to a [place].  5-French sheath was inserted.  A Cobra II catheter was inserted.  The celiac axis was selected over the [facility] wire and angiography was performed.  The catheter was advanced into the hepatic artery. A Pro[place] micro catheter was advanced into the left hepatic artery and imaging was performed demonstrating prompt filling of the large pseudoaneurysm.  Three 4 mm platinum embolization coils within deployed in the distal main left hepatic artery.  Sub selection into the medial segment branch was not performed because the vessel looked markedly abnormal and embolization proximal to the bifurcation would be required for ensuring successful embolization proximal to the injury site.  Repeat angiography was performed in the left hepatic artery.  The right hepatic artery was selected over a micro wire. Angiography was performed.  The segmental branch to the anterior segment of the right lobe of the liver was selected and subsequent angiography was performed.  Filling of the pseudoaneurysm from  the right hepatic artery branches could not be demonstrated.  The micro catheter was then re-advanced into the left hepatic artery and angiography confirms occlusion of the vessel and no filling of the pseudoaneurysm.  The micro catheter was removed.  Injection through the [place] II catheter position in the main hepatic artery was then also performed failing to demonstrate filling of the aneurysm.  The catheter and sheath were removed.  Hemostasis was achieved direct pressure.  Findings: Initial imaging demonstrates expected branching anatomy of the  common pedicle artery.  Splenic artery does not fill.  The patient has a very atrophic spleen on imaging. 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  Selective injection of the left hepatic artery confirms filling of the large pseudoaneurysm.  After embolization, there is markedly reduced flow within the left hepatic artery.  Selective injection of the right hepatic artery and anterior segmental branch demonstrates no evidence of filling of the large pseudoaneurysm or contrast extravasation.  Repeat injection of the left back artery demonstrates successful occlusion of this vessel.  Final injection through the Cobra II catheter in the common hepatic artery demonstrates resolution of the pseudoaneurysm.  Complications: None.  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  The main left hepatic artery was occluded at the branch point into the medial and lateral segments.  Original Report Authenticated By: Donavan Burnet, M.D.   Dg Abd Acute W/chest  03/17/2012  *RADIOLOGY REPORT*  Clinical Data: Mid abdominal pain and nausea.  ACUTE ABDOMEN SERIES (ABDOMEN 2 VIEW & CHEST 1 VIEW)  Comparison: Chest 09/29/2011  Findings: Stable appearance of cardiac pacemaker and postoperative changes in the mediastinum.  Shallow inspiration.  Cardiac enlargement with normal pulmonary vascularity for technique. Linear atelectasis or fibrosis in the lung bases.  No focal consolidation.  No blunting of costophrenic angles.  No pneumothorax.  Gas and stool scattered throughout the colon.  No small or large bowel distension.  No free intra-abdominal air.  No abnormal air fluid levels.  No radiopaque stones.  Mild degenerative changes in the spine and hips.  IMPRESSION: Shallow inspiration with linear atelectasis or fibrosis in the lung bases, stable since previous study.  Nonobstructive bowel gas pattern.  Original Report Authenticated By: Marlon Pel,  M.D.    Anti-infectives: Anti-infectives     Start     Dose/Rate Route Frequency Ordered Stop   03/17/12 1330   ceFAZolin (ANCEF) IVPB 1 g/50 mL premix        1 g 100 mL/hr over 30 Minutes Intravenous  Once 03/17/12 1322 03/17/12 1355   03/17/12 1223   ceFAZolin (ANCEF) 1-5 GM-% IVPB     Comments: KIRKMAN, JENNIFER: cabinet override         03/17/12 1223 03/18/12 0029          Assessment/Plan: [name]/p * No surgery found * Stay with clears. Serial hg Transfuse today with lasix in between for anemia and sob Bedrest Must be very careful with heparin at this point  LOS: 2 days    TOTH III,Carrie [name] 03/18/2012

## 2012-03-18 NOTE — Progress Notes (Signed)
ANTICOAGULATION CONSULT NOTE - Follow Up Consult  Pharmacy Consult for heparin Indication: atrial fibrillation  Allergies  Allergen Reactions  . Codeine Nausea And Vomiting    Patient Measurements: Height: 5\' 1"  (154.9 cm) Weight: 134 lb 11.2 oz (61.1 kg) IBW/kg (Calculated) : 47.8  Heparin Dosing Weight:   Vital Signs: Temp: 101.8 F (38.8 C) (06/27 2300) Temp src: Oral (06/27 2254) BP: 116/39 mmHg (06/28 0200) Pulse Rate: 96  (06/28 0200)  Labs:  Basename 03/18/12 0200 03/17/12 2315 03/17/12 2200 03/17/12 1434 03/17/12 1215 03/17/12 0030 03/17/12 0025  HGB 7.6* -- 7.7* -- -- -- --  HCT 22.1* -- 22.8* 17.2* -- -- --  PLT 160 -- 170 199 -- -- --  APTT -- -- -- -- -- -- --  LABPROT -- 16.7* -- -- 16.7* 29.5* --  INR -- 1.33 -- -- 1.33 2.75* --  HEPARINUNFRC 0.31 -- -- -- -- -- --  CREATININE 0.76 -- -- -- -- -- 0.95  CKTOTAL -- -- -- -- -- -- --  CKMB -- -- -- -- -- -- --  TROPONINI -- -- -- -- -- -- --    Estimated Creatinine Clearance: 54.9 ml/min (by C-G formula based on Cr of 0.76).   Medications:  Infusions:    . sodium chloride 20 mL/hr at 03/17/12 1245  . dextrose 5 % and 0.9 % NaCl with KCl 20 mEq/L 1,000 mL (03/17/12 1422)  . heparin 900 Units/hr (03/17/12 1947)    Assessment: Patient with heparin at goal, no bleeding noted.  Hgb still low.  Goal of Therapy:  Heparin level 0.3-0.7 units/ml Monitor platelets by anticoagulation protocol: Yes   Plan:  Continue with current rate, recheck level with 0800 CBC  Aleene Davidson Crowford 03/18/2012,3:33 AM

## 2012-03-18 NOTE — Progress Notes (Signed)
[facility]  DAILY PROGRESS NOTE   Subjective:  No events overnight. Mild right upper quadrant tenderness. History of prosthetic mitral valve vegetation.  Objective:  Temp:  [97.9 F (36.6 C)-102.8 F (39.3 C)] 100.1 F (37.8 C) (06/28 1430) Pulse Rate:  [71-101] 95  (06/28 1430) Resp:  [21-33] 33  (06/28 1430) BP: (116-160)/(39-87) 160/72 mmHg (06/28 1430) SpO2:  [93 %-100 %] 98 % (06/28 1200) Weight:  [61.1 kg (134 lb 11.2 oz)] 61.1 kg (134 lb 11.2 oz) (06/28 0400) Weight change:   Intake/Output from previous day: 06/27 0701 - 06/28 0700 In: 2886.8 [P.O.:360; I.V.:1539.3; Blood:937.5; IV Piggyback:50] Out: 2455 [Urine:2455]  Intake/Output from this shift: Total I/O In: 1460.5 [P.O.:240; I.V.:908; Blood:312.5] Out: 1850 [Urine:1850]  Medications: Current Facility-Administered Medications  Medication Dose Route Frequency Provider Last Rate Last Dose  . 0.9 %  sodium chloride infusion   Intravenous Continuous Art A Hoss, MD 20 mL/hr at 03/17/12 1245    . ceFAZolin (ANCEF) 1-5 GM-% IVPB           . dextrose 5 % and 0.9 % NaCl with KCl 20 mEq/L infusion   Intravenous Continuous Jeanella Craze, NP 50 mL/hr at 03/18/12 1300    . fentaNYL (SUBLIMAZE) 0.05 MG/ML injection           . furosemide (LASIX) injection 20 mg  20 mg Intravenous Once Caleen Essex III, MD      . heparin ADULT infusion 100 units/mL (25000 units/250 mL)  900 Units/hr Intravenous Continuous Runell Gess, MD 9 mL/hr at 03/17/12 1947 900 Units/hr at 03/17/12 1947  . lidocaine (XYLOCAINE) 1 % (with pres) injection           . midazolam (VERSED) 2 MG/2ML injection           . morphine 2 MG/ML injection 2 mg  2 mg Intravenous Q1H PRN Caleen Essex III, MD      . ondansetron [facility]) injection 4 mg  4 mg Intravenous Q6H PRN Caleen Essex III, MD      . pantoprazole (PROTONIX) injection 40 mg  40 mg Intravenous QHS Caleen Essex III, MD   40 mg at 03/17/12 2207    Physical Exam: General  appearance: alert and mild distress Neck: JVD - 3 cm above sternal notch, no adenopathy, no carotid bruit, supple, symmetrical, trachea midline and thyroid not enlarged, symmetric, no tenderness/mass/nodules Lungs: diminished breath sounds bilaterally Heart: irregularly irregular rhythm and tachycardic Abdomen: soft, non-tender; bowel sounds normal; no masses,  no organomegaly Extremities: extremities normal, atraumatic, no cyanosis or edema Pulses: 2+ and symmetric Skin: Skin color, texture, turgor normal. No rashes or lesions Neurologic: Grossly normal  Lab Results: Results for orders placed during the hospital encounter of 03/16/12 (from the past 48 hour(s))  GLUCOSE, CAPILLARY     Status: Abnormal   Collection Time   03/16/12 11:26 PM      Component Value Range Comment   Glucose-Capillary 184 (*) 70 - 99 mg/dL   CBC WITH DIFFERENTIAL     Status: Abnormal   Collection Time   03/17/12 12:25 AM      Component Value Range Comment   WBC 20.5 (*) 4.0 - 10.5 K/uL    RBC 2.85 (*) 3.87 - 5.11 MIL/uL    Hemoglobin 8.2 (*) 12.0 - 15.0 g/dL    HCT 21.3 (*) 08.6 - 46.0 %    MCV 89.1  78.0 - 100.0 fL  MCH 28.8  26.0 - 34.0 pg    MCHC 32.3  30.0 - 36.0 g/dL    RDW 16.1 (*) 09.6 - 15.5 %    Platelets 244  150 - 400 K/uL    Neutrophils Relative 83 (*) 43 - 77 %    Lymphocytes Relative 11 (*) 12 - 46 %    Monocytes Relative 6  3 - 12 %    Eosinophils Relative 0  0 - 5 %    Basophils Relative 0  0 - 1 %    Neutro Abs 17.0 (*) 1.7 - 7.7 K/uL    Lymphs Abs 2.3  0.7 - 4.0 K/uL    Monocytes Absolute 1.2 (*) 0.1 - 1.0 K/uL    Eosinophils Absolute 0.0  0.0 - 0.7 K/uL    Basophils Absolute 0.0  0.0 - 0.1 K/uL    WBC Morphology TOXIC GRANULATION     COMPREHENSIVE METABOLIC PANEL     Status: Abnormal   Collection Time   03/17/12 12:25 AM      Component Value Range Comment   Sodium 134 (*) 135 - 145 mEq/L    Potassium 3.9  3.5 - 5.1 mEq/L    Chloride 103  96 - 112 mEq/L    CO2 19  19 - 32 mEq/L     Glucose, Bld 172 (*) 70 - 99 mg/dL    BUN 17  6 - 23 mg/dL    Creatinine, Ser 0.45  0.50 - 1.10 mg/dL    Calcium 9.3  8.4 - 40.9 mg/dL    Total Protein 7.1  6.0 - 8.3 g/dL    Albumin 3.1 (*) 3.5 - 5.2 g/dL    AST 811 (*) 0 - 37 U/L    ALT 57 (*) 0 - 35 U/L    Alkaline Phosphatase 116  39 - 117 U/L    Total Bilirubin 0.5  0.3 - 1.2 mg/dL    GFR calc non Af Amer 59 (*) >90 mL/min    GFR calc Af Amer 69 (*) >90 mL/min   LIPASE, BLOOD     Status: Abnormal   Collection Time   03/17/12 12:25 AM      Component Value Range Comment   Lipase 67 (*) 11 - 59 U/L   PROTIME-INR     Status: Abnormal   Collection Time   03/17/12 12:30 AM      Component Value Range Comment   Prothrombin Time 29.5 (*) 11.6 - 15.2 seconds    INR 2.75 (*) 0.00 - 1.49   DIGOXIN LEVEL     Status: Abnormal   Collection Time   03/17/12 12:30 AM      Component Value Range Comment   Digoxin Level 0.7 (*) 0.8 - 2.0 ng/mL   POCT I-STAT TROPONIN I     Status: Normal   Collection Time   03/17/12 12:40 AM      Component Value Range Comment   Troponin i, poc 0.01  0.00 - 0.08 ng/mL    Comment 3            URINALYSIS, ROUTINE W REFLEX MICROSCOPIC     Status: Abnormal   Collection Time   03/17/12  3:57 AM      Component Value Range Comment   Color, Urine YELLOW  YELLOW    APPearance CLOUDY (*) CLEAR    Specific Gravity, Urine 1.018  1.005 - 1.030    pH 6.0  5.0 - 8.0    Glucose, UA  NEGATIVE  NEGATIVE mg/dL    Hgb urine dipstick NEGATIVE  NEGATIVE    Bilirubin Urine NEGATIVE  NEGATIVE    Ketones, ur NEGATIVE  NEGATIVE mg/dL    Protein, ur 161 (*) NEGATIVE mg/dL    Urobilinogen, UA 1.0  0.0 - 1.0 mg/dL    Nitrite NEGATIVE  NEGATIVE    Leukocytes, UA MODERATE (*) NEGATIVE   URINE CULTURE     Status: Normal   Collection Time   03/17/12  3:57 AM      Component Value Range Comment   Specimen Description URINE, CLEAN CATCH      Special Requests NONE      Culture  Setup Time [id number]      Colony Count 90,000  COLONIES/ML      Culture        Value: Multiple bacterial morphotypes present, none predominant. Suggest appropriate recollection if clinically indicated.   Report Status 03/18/2012 FINAL     URINE MICROSCOPIC-ADD ON     Status: Abnormal   Collection Time   03/17/12  3:57 AM      Component Value Range Comment   Squamous Epithelial / LPF MANY (*) RARE    WBC, UA 0-2  <3 WBC/hpf    RBC / HPF 0-2  <3 RBC/hpf    Bacteria, UA RARE  RARE    Urine-Other MUCOUS PRESENT     PREPARE FRESH FROZEN PLASMA     Status: Normal   Collection Time   03/17/12  6:30 AM      Component Value Range Comment   Unit Number [id number]      Blood Component Type THAWED PLASMA      Unit division 00      Status of Unit ISSUED,FINAL      Transfusion Status OK TO TRANSFUSE      Unit Number [id number]      Blood Component Type THAWED PLASMA      Unit division 00      Status of Unit ISSUED,FINAL      Transfusion Status OK TO TRANSFUSE     TYPE AND SCREEN     Status: Normal (Preliminary result)   Collection Time   03/17/12  6:35 AM      Component Value Range Comment   ABO/RH(D) A POS      Antibody Screen NEG      Sample Expiration 03/20/2012      Unit Number [id number]      Blood Component Type RED CELLS,LR      Unit division 00      Status of Unit ISSUED,FINAL      Transfusion Status OK TO TRANSFUSE      Crossmatch Result Compatible      Unit Number [id number]      Blood Component Type RED CELLS,LR      Unit division 00      Status of Unit ISSUED,FINAL      Transfusion Status OK TO TRANSFUSE      Crossmatch Result Compatible      Unit Number [id number]      Blood Component Type RED CELLS,LR      Unit division 00      Status of Unit ISSUED      Transfusion Status OK TO TRANSFUSE      Crossmatch Result Compatible      Unit Number [id number]      Blood Component Type RED CELLS,LR      Unit division 00  Status of Unit ALLOCATED      Transfusion Status OK TO TRANSFUSE      Crossmatch Result Compatible       MRSA PCR SCREENING     Status: Normal   Collection Time   03/17/12  8:30 AM      Component Value Range Comment   MRSA by PCR NEGATIVE  NEGATIVE   PROTIME-INR     Status: Abnormal   Collection Time   03/17/12 12:15 PM      Component Value Range Comment   Prothrombin Time 16.7 (*) 11.6 - 15.2 seconds    INR 1.33  0.00 - 1.49   CBC     Status: Abnormal   Collection Time   03/17/12  2:34 PM      Component Value Range Comment   WBC 11.9 (*) 4.0 - 10.5 K/uL    RBC 1.93 (*) 3.87 - 5.11 MIL/uL    Hemoglobin 5.7 (*) 12.0 - 15.0 g/dL    HCT 60.4 (*) 54.0 - 46.0 %    MCV 89.1  78.0 - 100.0 fL    MCH 29.5  26.0 - 34.0 pg    MCHC 33.1  30.0 - 36.0 g/dL    RDW 98.1 (*) 19.1 - 15.5 %    Platelets 199  150 - 400 K/uL   PREPARE RBC (CROSSMATCH)     Status: Normal   Collection Time   03/17/12  3:30 PM      Component Value Range Comment   Order Confirmation ORDER PROCESSED BY BLOOD BANK     CBC     Status: Abnormal   Collection Time   03/17/12 10:00 PM      Component Value Range Comment   WBC 14.1 (*) 4.0 - 10.5 K/uL    RBC 2.64 (*) 3.87 - 5.11 MIL/uL    Hemoglobin 7.7 (*) 12.0 - 15.0 g/dL    HCT 47.8 (*) 29.5 - 46.0 %    MCV 86.4  78.0 - 100.0 fL    MCH 29.2  26.0 - 34.0 pg    MCHC 33.8  30.0 - 36.0 g/dL    RDW 62.1 (*) 30.8 - 15.5 %    Platelets 170  150 - 400 K/uL   PROTIME-INR     Status: Abnormal   Collection Time   03/17/12 11:15 PM      Component Value Range Comment   Prothrombin Time 16.7 (*) 11.6 - 15.2 seconds    INR 1.33  0.00 - 1.49   BASIC METABOLIC PANEL     Status: Abnormal   Collection Time   03/18/12  2:00 AM      Component Value Range Comment   Sodium 133 (*) 135 - 145 mEq/L    Potassium 3.8  3.5 - 5.1 mEq/L    Chloride 103  96 - 112 mEq/L    CO2 23  19 - 32 mEq/L    Glucose, Bld 132 (*) 70 - 99 mg/dL    BUN 9  6 - 23 mg/dL    Creatinine, Ser 6.57  0.50 - 1.10 mg/dL    Calcium 8.5  8.4 - 84.6 mg/dL    GFR calc non Af Amer 83 (*) >90 mL/min    GFR calc Af Amer  >90  >90 mL/min   HEPARIN LEVEL (UNFRACTIONATED)     Status: Normal   Collection Time   03/18/12  2:00 AM      Component Value Range Comment   Heparin Unfractionated 0.31  0.30 - 0.70 IU/mL   CBC     Status: Abnormal   Collection Time   03/18/12  2:00 AM      Component Value Range Comment   WBC 16.1 (*) 4.0 - 10.5 K/uL    RBC 2.55 (*) 3.87 - 5.11 MIL/uL    Hemoglobin 7.6 (*) 12.0 - 15.0 g/dL    HCT 16.1 (*) 09.6 - 46.0 %    MCV 86.7  78.0 - 100.0 fL    MCH 29.8  26.0 - 34.0 pg    MCHC 34.4  30.0 - 36.0 g/dL    RDW 04.5 (*) 40.9 - 15.5 %    Platelets 160  150 - 400 K/uL   CBC     Status: Abnormal   Collection Time   03/18/12  8:45 AM      Component Value Range Comment   WBC 15.4 (*) 4.0 - 10.5 K/uL    RBC 2.58 (*) 3.87 - 5.11 MIL/uL    Hemoglobin 7.6 (*) 12.0 - 15.0 g/dL    HCT 81.1 (*) 91.4 - 46.0 %    MCV 87.2  78.0 - 100.0 fL    MCH 29.5  26.0 - 34.0 pg    MCHC 33.8  30.0 - 36.0 g/dL    RDW 78.2 (*) 95.6 - 15.5 %    Platelets 175  150 - 400 K/uL   PREPARE RBC (CROSSMATCH)     Status: Normal   Collection Time   03/18/12  9:00 AM      Component Value Range Comment   Order Confirmation ORDER PROCESSED BY BLOOD BANK     HEPARIN LEVEL (UNFRACTIONATED)     Status: Normal   Collection Time   03/18/12 10:00 AM      Component Value Range Comment   Heparin Unfractionated 0.36  0.30 - 0.70 IU/mL     Imaging:   Assessment:  1. Active Problems: 2.  S/P mitral valve replacement, [employer] 3.  Presence of permanent cardiac pacemaker 4.  Chronic anticoagulation, ( INR goal 2.5-3.5 under normal circumstances for [facility] [employer] MVR) 5.  A-fib with RVR 6.  Hemorrhage intraabdominal 7. History of endocarditis  Plan:  1. HCT stable after hepatic artery embolization. Her breathing is a little labored. She is being transfused currently. 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 I agree with blood cultures. If they  are negative or show a different organism, I would be less suspect that she has active ongoing endocarditis and would not recommend a repeat TEE.  She has already had 2 TEE's around the beginning of the year which have showed persistent mass, thought to be vegetation of the area around the native anterior mitral leaflet. She remains tachycardic, but this may be related to tachypnea and anemia. Would not add rate-controlling medication just yet - but could consider prn IV lopressor if HR is >150 or cardizem.  Time Spent Directly with Patient:  15 minutes  Length of Stay:  LOS: 2 days   Chrystie Nose, MD, [facility] Attending Cardiologist [facility]  Christina Gintz C 03/18/2012, 3:09 PM

## 2012-03-18 NOTE — Progress Notes (Addendum)
Name: Carrie [name] MRN: [id number] DOB: 1941/05/31    LOS: 2  Referring Provider:  CCS Reason for Referral:  Medical Mgmt ICU  PULMONARY / CRITICAL CARE MEDICINE  HPI:  [name] with PMH of HTN, Sick sinus syndrome, Anxiety, HLD, Afib, MVR on coumadin presented to [facility] ED 6/26 with a 3 week history of abdominal discomfort that she related to be reflux.  She had been seen by her primary care physician and treated with Zegerid. Only 2 weeks ago she had an episode of nausea and vomiting associated with abdominal pain that resolved after vomiting. Last p.m. she had dinner and then began having abrupt onset abdominal pain with associated nausea. Pain did not relieve and she presented to the emergency room at which time a CT demonstrated a contrast collection in the left lobe of the liver suggestive of active bleeding.  She denied abdominal injury, falls or other related incident that might cause bleeding.  In March of 2013 her hemoglobin was 12.6 and upon admission was noted to be 8.2. She was hemodynamically stable and admitted to the intensive care unit. PCCM consulted for ICU medical management.    Brief patient description:  71 year old female with a medical history of atrial fibrillation and mitral valve replacement on Coumadin who presented with acute abdominal pain.  CT of the abdomen concerning for hemorrhage within the liver.  Planned embolization by interventional radiology on 6/27.  Events Since Admission: 6/27 To IR for IR embolization of L hepatic artery  Current Status: VSS.  No acute distress but having fever post PRBC and proceduer and bleed  Vital Signs: Temp:  [97.4 F (36.3 C)-102.8 F (39.3 C)] 98.9 F (37.2 C) (06/28 0800) Pulse Rate:  [62-101] 101  (06/28 0600) Resp:  [18-32] 25  (06/28 0600) BP: (116-176)/(39-87) 145/87 mmHg (06/28 0600) SpO2:  [0 %-100 %] 95 % (06/28 0600) Weight:  [134 lb 11.2 oz (61.1 kg)] 134 lb 11.2 oz (61.1 kg) (06/28 0400)  Physical  Examination: General:  Well-developed well-nourished in no acute distress Neuro:  Awake, alert, oriented / moving all extremities HEENT:  Mm pink / moist Cardiovascular:  S1-S2 irregularly irregular rate,controlled in the 50s/ 60s Lungs:  Respirations are even and nonlabored, lungs bilaterally are clear Abdomen:  Round/soft, bowel sounds x4 hypoactive Musculoskeletal:  No obvious deformity Skin:  Warm and dry, no edema  Active Problems:  S/P mitral valve replacement, [employer]  Presence of permanent cardiac pacemaker  Chronic anticoagulation, ( INR goal 2.5-3.5 under normal circumstances for [employer] MVR)  A-fib  Hemorrhage intraabdominal   ASSESSMENT AND PLAN  PULMONARY No results found for this basename: PHART:5,PCO2:5,PCO2ART:5,PO2ART:5,HCO3:5,O2SAT:5 in the last 168 hours Ventilator Settings:   CXR:  No new chest x-ray. Atelectasis in the bases noted on CT of the abdomen ETT:    A:   Basilar atelectasis P:   -O2 to support sats > 92% -pulmonary hygiene  CARDIOVASCULAR No results found for this basename: TROPONINI:5,LATICACIDVEN:5, O2SATVEN:5,PROBNP:5 in the last 168 hours ECG:  HR 50's / 60's, irregular, PAC's Lines:   A:  Hx of HTN Hx of Afib on coumadin Hx of MVR  P:  -tele monitor -hold home metoprolol, lanoxin, coumadin for now   RENAL  Lab 03/18/12 0200 03/17/12 0025  NA 133* 134*  K 3.8 3.9  CL 103 103  CO2 23 19  BUN 9 17  CREATININE 0.76 0.95  CALCIUM 8.5 9.3  MG -- --  PHOS -- --   Intake/Output  06/27 0701 - 06/28 0700 06/28 0701 - 06/29 0700   P.O. 360    I.V. (mL/kg) 1439.3 (23.6)    Blood 937.5    IV Piggyback 50    Total Intake(mL/kg) 2786.8 (45.6)    Urine (mL/kg/hr) 2455 (1.7)    Total Output 2455    Net +331.8          Foley:    A:   Mild Hyponatremia  P:   -trend BMP -monitor renal function with relative volume loss  GASTROINTESTINAL  Lab 03/17/12 0025  AST 113*  ALT 57*  ALKPHOS 116  BILITOT 0.5  PROT 7.1   ALBUMIN 3.1*   6/26 CT ABD/Pelvis:  Contrast collection in the left lobe of the liver suggesting focus of active extravasation likely due to pseudoaneurysm versus arterial portal shunting versus hemorrhagic tumor. Cystic changes in the lateral segment of the left lobe may represent mass lesion, bile duct dilatation, or hematomas. Large amount of free hemorrhage throughout the peritoneum.  A:   Hepatic / Peritoneal Hemorrhage -no clear injury.  ?spontaneous bleed.  INR within therapeutic range.   P:   -To IR for embolization -Rec's per CCS / IR  HEMATOLOGIC  Lab 03/18/12 1620 03/18/12 0845 03/18/12 0200 03/17/12 2315 03/17/12 2200 03/17/12 1434 03/17/12 1215 03/17/12 0030 03/17/12 0025  HGB -- 7.6* 7.6* -- 7.7* 5.7* -- -- 8.2*  HCT -- 22.5* 22.1* -- 22.8* 17.2* -- -- 25.4*  PLT -- 175 160 -- 170 199 -- -- 244  INR 1.32 -- -- 1.33 -- -- 1.33 2.75* --  APTT -- -- -- -- -- -- -- -- --   A:   Acute blood loss anemia Coagulopathy -on coumadin as above P:  -FFP / FEIBA / reversal per primary -hold coumadin -heparin gtt per pharmacy -transfusion per CCS -follow h/h  - - PRBC for hgb </= 6.9gm%    - exceptions are   -  if ACS susepcted/confirmed then transfuse for hgb </= 8.0gm%,  or    -  If septic shock first 24h and scvo2 < 70% then transfuse for hgb </= 9.0gm%   - active bleeding with hemodynamic instability, then transfuse regardless of hemoglobin value   At at all times try to transfuse 1 unit prbc as possible with exception of active hemorrhage     INFECTIOUS  Lab 03/18/12 0845 03/18/12 0200 03/17/12 2200 03/17/12 1434 03/17/12 0025  WBC 15.4* 16.1* 14.1* 11.9* 20.5*  PROCALCITON -- -- -- -- --   Cultures: 6/28 BCx2>>>  Antibiotics: 6/27 Cefazolin>>>  A:   Fever - overnight 6/27.  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  Likely related to blood in abdomen.  However, concern for mycotic seeding must be considered given hx of valvular vegetation.   P:   -monitor,  trend leuks, fever curve an -check bc x1 to ensure no infectious component -Staff note: D/w Dr Rennis Golden - check blood culture for temp spikes  And also check sepsis biomarkerts  ENDOCRINE  Lab 03/16/12 2326  GLUCAP 184*   A:   No acute issues   P:   -monitor glucose on BMP  NEUROLOGIC  A:   No acute issues.   P:   -supportive care  BEST PRACTICE / DISPOSITION Level of Care:  ICU Primary Service:  CCS Consultants:  PCCM Code Status:  Full Diet:  NPO DVT Px:  SCD's  GI Px:  protonix Skin Integrity:  intact Social / Family:  None at bedside.  Patient updated.  Canary Brim, NP-C [facility] Pgr: [phone] or [phone]   03/18/2012 9:43 AM    STAFF NOTE: I, Dr Lavinia Sharps have personally reviewed patient's available data, including medical history, events of note, physical examination and test results as part of my evaluation. I have discussed with resident/NP and other care providers such as pharmacist, RN and RRT.  In addition,  I personally evaluated patient and elicited key findings of indicated as staff note: We will continue to watch fvers closely. Will check sepsis biomarkers to ddx fever from hge/prbc v infection/sepsis.  Rest per NP/medical resident whose note is outlined above and that I agree with  .  Dr. Kalman Shan, M.D., [facility] [place].C.P Pulmonary and Critical Care Medicine Staff Physician [facility] Pager: [phone], If no answer or between  15:00h - 7:00h: call [phone]  03/18/2012 6:55 PM

## 2012-03-18 NOTE — Progress Notes (Signed)
Subjective: Pt seems ok this am. C/o mild abd tenderness. Denies N/V and has tolerated some clears already. Hep gtt started last night. Spiked fevers last pm, but afebrile this am.  Objective: Physical Exam: BP 145/87  Pulse 101  Temp 98.9 F (37.2 C) (Oral)  Resp 25  Ht 5\' 1"  (1.549 m)  Wt 134 lb 11.2 oz (61.1 kg)  BMI 25.45 kg/m2  SpO2 95% Abdomen: soft, ND, few BS Mildly tender mid upper abd (R)groin site clean, no hematoma, pedal pulses bounding.    Labs: CBC  Basename 03/18/12 0200 03/17/12 2200  WBC 16.1* 14.1*  HGB 7.6* 7.7*  HCT 22.1* 22.8*  PLT 160 170   BMET  Basename 03/18/12 0200 03/17/12 0025  NA 133* 134*  K 3.8 3.9  CL 103 103  CO2 23 19  GLUCOSE 132* 172*  BUN 9 17  CREATININE 0.76 0.95  CALCIUM 8.5 9.3   LFT  Basename 03/17/12 0025  PROT 7.1  ALBUMIN 3.1*  AST 113*  ALT 57*  ALKPHOS 116  BILITOT 0.5  BILIDIR --  IBILI --  LIPASE 67*   PT/INR  Basename 03/17/12 2315 03/17/12 1215  LABPROT 16.7* 16.7*  INR 1.33 1.33     Studies/Results: Ct Abdomen Pelvis W Contrast  03/17/2012  *RADIOLOGY REPORT*  Clinical Data: Generalized abdominal pain, nausea and vomiting.  CT ABDOMEN AND PELVIS WITH CONTRAST  Technique:  Multidetector CT imaging of the abdomen and pelvis was performed following the standard protocol during bolus administration of intravenous contrast.  Contrast: OMNIPAQUE IOHEXOL 300 MG/ML  SOLN  Comparison: None.  Findings: Atelectasis or infiltration in the lung bases.  Diffuse cardiac enlargement.  Mitral valve replacement.  Coronary artery calcifications.  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  Focal contrast collections in the left lobe of the liver centrally measuring 2.4 x 3.9 cm with multiple complex cystic changes in the lateral segment of the left lobe.  The cystic changes could represent a complex cyst such as hemorrhagic or infectious cysts  versus local bile duct dilatation versus mass lesions.  The contrast collection to suggest active extravasation which could be due to pseudoaneurysm, hemorrhagic tumor, or arterial portal shunt. There is a moderately large amount of complex fluid in the upper abdomen suggesting peritoneal hemorrhage.  Fluid extends down along the pericolic gutters bilaterally into the pelvis and in the inguinal canals via small hernias.  The gallbladder, pancreas and adrenal glands are unremarkable. Calcification of the abdominal aorta without aneurysm.  No retroperitoneal fluid collections.  Scarring in the renal parenchyma with small sub centimeter parenchymal cysts.  No hydronephrosis or solid renal mass lesions.  There is infiltrative change in the mesenteric, likely resulting from the peritoneal fluid although mesenteric metastasis is not excluded.  The stomach, small bowel, and colon are not abnormally distended.  No free air in the abdomen.  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  No abnormal adnexal masses.  Bladder wall is not thickened.  Hemorrhagic fluid in the pelvis as previously discussed.  Scattered diverticula without diverticulitis.  The appendix appears normal.  Degenerative changes in the spine.  IMPRESSION: Contrast collection in the left lobe of the liver suggesting focus of active extravasation likely due to pseudoaneurysm versus arterial portal shunting versus hemorrhagic tumor.  Cystic changes in the lateral segment of the left lobe may represent mass lesion, bile duct dilatation, or hematomas.  Large amount of free hemorrhage throughout the peritoneum.  Critical Value/emergent results were called by telephone at the time of interpretation on 03/17/2012  at 0546 hours  to  Dr. Orson Slick, who verbally acknowledged these results.  Original Report Authenticated By: Marlon Pel, M.D.   Ir Angiogram Visceral Selective  03/17/2012  *RADIOLOGY REPORT*  Clinical Data/Indication: LIVER INJURY.  ACTIVE  EXTRAVASATION FROM AN INJURY IN THE LEFT LOBE.  TRANSCATHETER THERAPY EMBOLIZATION,ARTERIOGRAPHY,ADDITIONAL ARTERIOGRAPHY,IR ULTRASOUND GUIDANCE VASC ACCESS RIGHT,SELECTIVE VISCERAL ARTERIOGRAPHY  Sedation: Versed 2 mg, Fentanyl 100 mg.  Total Moderate Sedation Time: 100 minutes.  Contrast Volume: 61 ml Omnipaque-300.  Additional Medications: Cefazolin 1 gram.  Fluoroscopy Time: 9.9 minutes.  Procedure: The procedure, risks, benefits, and alternatives were explained to the patient. Questions regarding the procedure were encouraged and answered. The patient understands and consents to the procedure.  The right groin was prepped with betadine in a sterile fashion, and a sterile drape was applied covering the operative field. A sterile gown and sterile gloves were used for the procedure.  Under sonographic guidance, a micropuncture needle was inserted into the right common femoral artery and removed over a 0018 wire. This was upsized to a [place].  5-French sheath was inserted.  A Cobra II catheter was inserted.  The celiac axis was selected over the  [facility] wire and angiography was performed.  The catheter was advanced into the hepatic artery. A Progreat micro catheter was advanced into the left hepatic artery and imaging was performed demonstrating prompt filling of the large pseudoaneurysm.  Three 4 mm platinum embolization coils within deployed in the distal main left hepatic artery.  Sub selection into the medial segment branch was not performed because the vessel looked markedly abnormal and embolization proximal to the bifurcation would be required for ensuring successful embolization proximal to the injury site.  Repeat angiography was performed in the left hepatic artery.  The right hepatic artery was selected over a micro wire. Angiography was performed.  The segmental branch to the anterior segment of the right lobe of the liver was selected and subsequent angiography was performed.  Filling of the  pseudoaneurysm from the right hepatic artery branches could not be demonstrated.  The micro catheter was then re-advanced into the left hepatic artery and angiography confirms occlusion of the vessel and no filling of the pseudoaneurysm.  The micro catheter was removed.  Injection through the [place] II catheter position in the main hepatic artery was then also performed failing to demonstrate filling of the aneurysm.  The catheter and sheath were removed.  Hemostasis was achieved direct pressure.  Findings: Initial imaging demonstrates expected branching anatomy of the common pedicle artery.  Splenic artery does not fill.  The patient has a very atrophic spleen on imaging. 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  Selective injection of the left hepatic artery confirms filling of the large pseudoaneurysm.  After embolization, there is markedly reduced flow within the left hepatic artery.  Selective injection of the right hepatic artery and anterior segmental branch demonstrates no evidence of filling of the large pseudoaneurysm or contrast extravasation.  Repeat injection of the left back artery demonstrates successful occlusion of this vessel.  Final injection through the Cobra II catheter in the common hepatic artery demonstrates resolution of the pseudoaneurysm.  Complications: None.  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  The main left hepatic artery was occluded at the branch point into the medial and lateral segments.  Original Report Authenticated  By: Donavan Burnet, M.D.   Ir Angiogram Selective Each Additional Vessel  03/17/2012  *RADIOLOGY REPORT*  Clinical Data/Indication: LIVER INJURY.  ACTIVE EXTRAVASATION FROM AN INJURY IN THE LEFT LOBE.  TRANSCATHETER THERAPY EMBOLIZATION,ARTERIOGRAPHY,ADDITIONAL ARTERIOGRAPHY,IR ULTRASOUND GUIDANCE VASC ACCESS RIGHT,SELECTIVE VISCERAL ARTERIOGRAPHY  Sedation: Versed 2 mg,  Fentanyl 100 mg.  Total Moderate Sedation Time: 100 minutes.  Contrast Volume: 61 ml Omnipaque-300.  Additional Medications: Cefazolin 1 gram.  Fluoroscopy Time: 9.9 minutes.  Procedure: The procedure, risks, benefits, and alternatives were explained to the patient. Questions regarding the procedure were encouraged and answered. The patient understands and consents to the procedure.  The right groin was prepped with betadine in a sterile fashion, and a sterile drape was applied covering the operative field. A sterile gown and sterile gloves were used for the procedure.  Under sonographic guidance, a micropuncture needle was inserted into the right common femoral artery and removed over a 0018 wire. This was upsized to a [place].  5-French sheath was inserted.  A Cobra II catheter was inserted.  The celiac axis was selected over the [facility] wire and angiography was performed.  The catheter was advanced into the hepatic artery. A Progreat micro catheter was advanced into the left hepatic artery and imaging was performed demonstrating prompt filling of the large pseudoaneurysm.  Three 4 mm platinum embolization coils within deployed in the distal main left hepatic artery.  Sub selection into the medial segment branch was not performed because the vessel looked markedly abnormal and embolization proximal to the bifurcation would be required for ensuring successful embolization proximal to the injury site.  Repeat angiography was performed in the left hepatic artery.  The right hepatic artery was selected over a micro wire. Angiography was performed.  The segmental branch to the anterior segment of the right lobe of the liver was selected and subsequent angiography was performed.  Filling of the pseudoaneurysm from the right hepatic artery branches could not be demonstrated.  The micro catheter was then re-advanced into the left hepatic artery and angiography confirms occlusion of the vessel and no filling of the  pseudoaneurysm.  The micro catheter was removed.  Injection through the [place] II catheter position in the main hepatic artery was then also performed failing to demonstrate filling of the aneurysm.  The catheter and sheath were removed.  Hemostasis was achieved direct pressure.  Findings: Initial imaging demonstrates expected branching anatomy of the common pedicle artery.  Splenic artery does not fill.  The patient has a very atrophic spleen on imaging. 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  Selective injection of the left hepatic artery confirms filling of the large pseudoaneurysm.  After embolization, there is markedly reduced flow within the left hepatic artery.  Selective injection of the right hepatic artery and anterior segmental branch demonstrates no evidence of filling of the large pseudoaneurysm or contrast extravasation.  Repeat injection of the left back artery demonstrates successful occlusion of this vessel.  Final injection through the Cobra II catheter in the common hepatic artery demonstrates resolution of the pseudoaneurysm.  Complications: None.  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  The main left hepatic artery was occluded at the branch point into the medial and lateral segments.  Original Report Authenticated By: Donavan Burnet, M.D.   Ir Angiogram Selective Each Additional Vessel  03/17/2012  *RADIOLOGY REPORT*  Clinical Data/Indication: LIVER INJURY.  ACTIVE EXTRAVASATION FROM AN INJURY IN  THE LEFT LOBE.  TRANSCATHETER THERAPY EMBOLIZATION,ARTERIOGRAPHY,ADDITIONAL ARTERIOGRAPHY,IR ULTRASOUND GUIDANCE VASC ACCESS RIGHT,SELECTIVE VISCERAL ARTERIOGRAPHY  Sedation: Versed 2 mg, Fentanyl 100 mg.  Total Moderate Sedation Time: 100 minutes.  Contrast Volume: 61 ml Omnipaque-300.  Additional Medications: Cefazolin 1 gram.  Fluoroscopy Time: 9.9 minutes.  Procedure: The procedure, risks, benefits, and  alternatives were explained to the patient. Questions regarding the procedure were encouraged and answered. The patient understands and consents to the procedure.  The right groin was prepped with betadine in a sterile fashion, and a sterile drape was applied covering the operative field. A sterile gown and sterile gloves were used for the procedure.  Under sonographic guidance, a micropuncture needle was inserted into the right common femoral artery and removed over a 0018 wire. This was upsized to a [place].  5-French sheath was inserted.  A Cobra II catheter was inserted.  The celiac axis was selected over the [facility] wire and angiography was performed.  The catheter was advanced into the hepatic artery. A Progreat micro catheter was advanced into the left hepatic artery and imaging was performed demonstrating prompt filling of the large pseudoaneurysm.  Three 4 mm platinum embolization coils within deployed in the distal main left hepatic artery.  Sub selection into the medial segment branch was not performed because the vessel looked markedly abnormal and embolization proximal to the bifurcation would be required for ensuring successful embolization proximal to the injury site.  Repeat angiography was performed in the left hepatic artery.  The right hepatic artery was selected over a micro wire. Angiography was performed.  The segmental branch to the anterior segment of the right lobe of the liver was selected and subsequent angiography was performed.  Filling of the pseudoaneurysm from the right hepatic artery branches could not be demonstrated.  The micro catheter was then re-advanced into the left hepatic artery and angiography confirms occlusion of the vessel and no filling of the pseudoaneurysm.  The micro catheter was removed.  Injection through the [place] II catheter position in the main hepatic artery was then also performed failing to demonstrate filling of the aneurysm.  The catheter and sheath were  removed.  Hemostasis was achieved direct pressure.  Findings: Initial imaging demonstrates expected branching anatomy of the common pedicle artery.  Splenic artery does not fill.  The patient has a very atrophic spleen on imaging. 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  Selective injection of the left hepatic artery confirms filling of the large pseudoaneurysm.  After embolization, there is markedly reduced flow within the left hepatic artery.  Selective injection of the right hepatic artery and anterior segmental branch demonstrates no evidence of filling of the large pseudoaneurysm or contrast extravasation.  Repeat injection of the left back artery demonstrates successful occlusion of this vessel.  Final injection through the Cobra II catheter in the common hepatic artery demonstrates resolution of the pseudoaneurysm.  Complications: None.  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  The main left hepatic artery was occluded at the branch point into the medial and lateral segments.  Original Report Authenticated By: Donavan Burnet, M.D.   Ir Angiogram Selective Each Additional Vessel  03/17/2012  *RADIOLOGY REPORT*  Clinical Data/Indication: LIVER INJURY.  ACTIVE EXTRAVASATION FROM AN INJURY IN THE LEFT LOBE.  TRANSCATHETER THERAPY EMBOLIZATION,ARTERIOGRAPHY,ADDITIONAL ARTERIOGRAPHY,IR ULTRASOUND GUIDANCE VASC ACCESS RIGHT,SELECTIVE VISCERAL ARTERIOGRAPHY  Sedation: Versed 2 mg, Fentanyl 100 mg.  Total Moderate Sedation Time: 100 minutes.  Contrast Volume: 61 ml Omnipaque-300.  Additional Medications: Cefazolin 1 gram.  Fluoroscopy Time: 9.9 minutes.  Procedure: The procedure, risks, benefits, and alternatives were explained to the patient. Questions regarding the procedure were encouraged and answered. The patient understands and consents to the procedure.  The right groin was prepped with betadine in a sterile fashion, and  a sterile drape was applied covering the operative field. A sterile gown and sterile gloves were used for the procedure.  Under sonographic guidance, a micropuncture needle was inserted into the right common femoral artery and removed over a 0018 wire. This was upsized to a [place].  5-French sheath was inserted.  A Cobra II catheter was inserted.  The celiac axis was selected over the [facility] wire and angiography was performed.  The catheter was advanced into the hepatic artery. A Progreat micro catheter was advanced into the left hepatic artery and imaging was performed demonstrating prompt filling of the large pseudoaneurysm.  Three 4 mm platinum embolization coils within deployed in the distal main left hepatic artery.  Sub selection into the medial segment branch was not performed because the vessel looked markedly abnormal and embolization proximal to the bifurcation would be required for ensuring successful embolization proximal to the injury site.  Repeat angiography was performed in the left hepatic artery.  The right hepatic artery was selected over a micro wire. Angiography was performed.  The segmental branch to the anterior segment of the right lobe of the liver was selected and subsequent angiography was performed.  Filling of the pseudoaneurysm from the right hepatic artery branches could not be demonstrated.  The micro catheter was then re-advanced into the left hepatic artery and angiography confirms occlusion of the vessel and no filling of the pseudoaneurysm.  The micro catheter was removed.  Injection through the [place] II catheter position in the main hepatic artery was then also performed failing to demonstrate filling of the aneurysm.  The catheter and sheath were removed.  Hemostasis was achieved direct pressure.  Findings: Initial imaging demonstrates expected branching anatomy of the common pedicle artery.  Splenic artery does not fill.  The patient has a very atrophic spleen on imaging.  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  Selective injection of the left hepatic artery confirms filling of the large pseudoaneurysm.  After embolization, there is markedly reduced flow within the left hepatic artery.  Selective injection of the right hepatic artery and anterior segmental branch demonstrates no evidence of filling of the large pseudoaneurysm or contrast extravasation.  Repeat injection of the left back artery demonstrates successful occlusion of this vessel.  Final injection through the Cobra II catheter in the common hepatic artery demonstrates resolution of the pseudoaneurysm.  Complications: None.  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  The main left hepatic artery was occluded at the branch point into the medial and lateral segments.  Original Report Authenticated By: Donavan Burnet, M.D.   Ir Transcath/emboliz  03/17/2012  *RADIOLOGY REPORT*  Clinical Data/Indication: LIVER INJURY.  ACTIVE EXTRAVASATION FROM AN INJURY IN THE LEFT LOBE.  TRANSCATHETER THERAPY EMBOLIZATION,ARTERIOGRAPHY,ADDITIONAL ARTERIOGRAPHY,IR ULTRASOUND GUIDANCE VASC ACCESS RIGHT,SELECTIVE VISCERAL ARTERIOGRAPHY  Sedation: Versed 2 mg, Fentanyl 100 mg.  Total Moderate Sedation Time: 100 minutes.  Contrast Volume: 61 ml Omnipaque-300.  Additional Medications: Cefazolin 1 gram.  Fluoroscopy Time: 9.9 minutes.  Procedure: The procedure, risks, benefits, and alternatives were explained to the patient. Questions regarding the procedure were  encouraged and answered. The patient understands and consents to the procedure.  The right groin was prepped with betadine in a sterile fashion, and a sterile drape was applied covering the operative field. A sterile gown and sterile gloves were used for the procedure.  Under sonographic guidance, a micropuncture needle was inserted into the right common femoral artery and removed over a 0018 wire.  This was upsized to a [place].  5-French sheath was inserted.  A Cobra II catheter was inserted.  The celiac axis was selected over the [facility] wire and angiography was performed.  The catheter was advanced into the hepatic artery. A Progreat micro catheter was advanced into the left hepatic artery and imaging was performed demonstrating prompt filling of the large pseudoaneurysm.  Three 4 mm platinum embolization coils within deployed in the distal main left hepatic artery.  Sub selection into the medial segment branch was not performed because the vessel looked markedly abnormal and embolization proximal to the bifurcation would be required for ensuring successful embolization proximal to the injury site.  Repeat angiography was performed in the left hepatic artery.  The right hepatic artery was selected over a micro wire. Angiography was performed.  The segmental branch to the anterior segment of the right lobe of the liver was selected and subsequent angiography was performed.  Filling of the pseudoaneurysm from the right hepatic artery branches could not be demonstrated.  The micro catheter was then re-advanced into the left hepatic artery and angiography confirms occlusion of the vessel and no filling of the pseudoaneurysm.  The micro catheter was removed.  Injection through the [place] II catheter position in the main hepatic artery was then also performed failing to demonstrate filling of the aneurysm.  The catheter and sheath were removed.  Hemostasis was achieved direct pressure.  Findings: Initial imaging demonstrates expected branching anatomy of the common pedicle artery.  Splenic artery does not fill.  The patient has a very atrophic spleen on imaging. 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  Selective injection of the left hepatic artery confirms filling of the large pseudoaneurysm.  After embolization, there is markedly reduced flow within the left hepatic artery.   Selective injection of the right hepatic artery and anterior segmental branch demonstrates no evidence of filling of the large pseudoaneurysm or contrast extravasation.  Repeat injection of the left back artery demonstrates successful occlusion of this vessel.  Final injection through the Cobra II catheter in the common hepatic artery demonstrates resolution of the pseudoaneurysm.  Complications: None.  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  The main left hepatic artery was occluded at the branch point into the medial and lateral segments.  Original Report Authenticated By: Donavan Burnet, M.D.   Ir Angiogram Follow Up Study  03/17/2012  *RADIOLOGY REPORT*  Clinical Data/Indication: LIVER INJURY.  ACTIVE EXTRAVASATION FROM AN INJURY IN THE LEFT LOBE.  TRANSCATHETER THERAPY EMBOLIZATION,ARTERIOGRAPHY,ADDITIONAL ARTERIOGRAPHY,IR ULTRASOUND GUIDANCE VASC ACCESS RIGHT,SELECTIVE VISCERAL ARTERIOGRAPHY  Sedation: Versed 2 mg, Fentanyl 100 mg.  Total Moderate Sedation Time: 100 minutes.  Contrast Volume: 61 ml Omnipaque-300.  Additional Medications: Cefazolin 1 gram.  Fluoroscopy Time: 9.9 minutes.  Procedure: The procedure, risks, benefits, and alternatives were explained to the patient. Questions regarding the procedure were encouraged and answered. The patient understands and consents to the procedure.  The right groin was prepped with betadine in a sterile fashion, and a sterile drape was applied covering the  operative field. A sterile gown and sterile gloves were used for the procedure.  Under sonographic guidance, a micropuncture needle was inserted into the right common femoral artery and removed over a 0018 wire. This was upsized to a [place].  5-French sheath was inserted.  A Cobra II catheter was inserted.  The celiac axis was selected over the [facility] wire and angiography was performed.  The catheter was advanced into the hepatic artery. A Progreat micro  catheter was advanced into the left hepatic artery and imaging was performed demonstrating prompt filling of the large pseudoaneurysm.  Three 4 mm platinum embolization coils within deployed in the distal main left hepatic artery.  Sub selection into the medial segment branch was not performed because the vessel looked markedly abnormal and embolization proximal to the bifurcation would be required for ensuring successful embolization proximal to the injury site.  Repeat angiography was performed in the left hepatic artery.  The right hepatic artery was selected over a micro wire. Angiography was performed.  The segmental branch to the anterior segment of the right lobe of the liver was selected and subsequent angiography was performed.  Filling of the pseudoaneurysm from the right hepatic artery branches could not be demonstrated.  The micro catheter was then re-advanced into the left hepatic artery and angiography confirms occlusion of the vessel and no filling of the pseudoaneurysm.  The micro catheter was removed.  Injection through the [place] II catheter position in the main hepatic artery was then also performed failing to demonstrate filling of the aneurysm.  The catheter and sheath were removed.  Hemostasis was achieved direct pressure.  Findings: Initial imaging demonstrates expected branching anatomy of the common pedicle artery.  Splenic artery does not fill.  The patient has a very atrophic spleen on imaging. 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  Selective injection of the left hepatic artery confirms filling of the large pseudoaneurysm.  After embolization, there is markedly reduced flow within the left hepatic artery.  Selective injection of the right hepatic artery and anterior segmental branch demonstrates no evidence of filling of the large pseudoaneurysm or contrast extravasation.  Repeat injection of the left back artery demonstrates successful occlusion of  this vessel.  Final injection through the Cobra II catheter in the common hepatic artery demonstrates resolution of the pseudoaneurysm.  Complications: None.  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  The main left hepatic artery was occluded at the branch point into the medial and lateral segments.  Original Report Authenticated By: Donavan Burnet, M.D.   Ir Fluoro Guide Cv Line Right  03/17/2012  *RADIOLOGY REPORT*  Clinical Data: Peritoneal hemorrhage  PICC LINE PLACEMENT WITH ULTRASOUND AND FLUOROSCOPIC  GUIDANCE  Fluoroscopy Time: 0.2 minutes.  The right arm was prepped with chlorhexidine, draped in the usual sterile fashion using maximum barrier technique (cap and mask, sterile gown, sterile gloves, large sterile sheet, hand hygiene and cutaneous antisepsis) and infiltrated locally with 1% Lidocaine.  Ultrasound demonstrated patency of the right basilic vein, and this was documented with an image.  Under real-time ultrasound guidance, this vein was accessed with a 21 gauge micropuncture needle and image documentation was performed.  The needle was exchanged over a guidewire for a peel-away sheath through which a five [place] double lumen PICC trimmed to 32 cm was advanced, positioned with its tip at the lower SVC/right atrial junction.  Fluoroscopy during the procedure and fluoro  spot radiograph confirms appropriate catheter position.  The catheter was flushed, secured to the skin with Prolene sutures, and covered with a sterile dressing.  Complications:  None  IMPRESSION: Successful right arm PICC line placement with ultrasound and fluoroscopic guidance.  The catheter is ready for use.  Original Report Authenticated By: Donavan Burnet, M.D.   Ir US Guide Vasc Access Right  03/17/2012  *RADIOLOGY REPORT*  Clinical Data: Peritoneal hemorrhage  PICC LINE PLACEMENT WITH ULTRASOUND AND FLUOROSCOPIC  GUIDANCE  Fluoroscopy Time: 0.2 minutes.  The right arm was  prepped with chlorhexidine, draped in the usual sterile fashion using maximum barrier technique (cap and mask, sterile gown, sterile gloves, large sterile sheet, hand hygiene and cutaneous antisepsis) and infiltrated locally with 1% Lidocaine.  Ultrasound demonstrated patency of the right basilic vein, and this was documented with an image.  Under real-time ultrasound guidance, this vein was accessed with a 21 gauge micropuncture needle and image documentation was performed.  The needle was exchanged over a guidewire for a peel-away sheath through which a five [place] double lumen PICC trimmed to 32 cm was advanced, positioned with its tip at the lower SVC/right atrial junction.  Fluoroscopy during the procedure and fluoro spot radiograph confirms appropriate catheter position.  The catheter was flushed, secured to the skin with Prolene sutures, and covered with a sterile dressing.  Complications:  None  IMPRESSION: Successful right arm PICC line placement with ultrasound and fluoroscopic guidance.  The catheter is ready for use.  Original Report Authenticated By: Donavan Burnet, M.D.   Ir US Guide Vasc Access Right  03/17/2012  *RADIOLOGY REPORT*  Clinical Data/Indication: LIVER INJURY.  ACTIVE EXTRAVASATION FROM AN INJURY IN THE LEFT LOBE.  TRANSCATHETER THERAPY EMBOLIZATION,ARTERIOGRAPHY,ADDITIONAL ARTERIOGRAPHY,IR ULTRASOUND GUIDANCE VASC ACCESS RIGHT,SELECTIVE VISCERAL ARTERIOGRAPHY  Sedation: Versed 2 mg, Fentanyl 100 mg.  Total Moderate Sedation Time: 100 minutes.  Contrast Volume: 61 ml [id number]-300.  Additional Medications: Cefazolin 1 gram.  Fluoroscopy Time: 9.9 minutes.  Procedure: The procedure, risks, benefits, and alternatives were explained to the patient. Questions regarding the procedure were encouraged and answered. The patient understands and consents to the procedure.  The right groin was prepped with betadine in a sterile fashion, and a sterile drape was applied covering the operative field.  A sterile gown and sterile gloves were used for the procedure.  Under sonographic guidance, a micropuncture needle was inserted into the right common femoral artery and removed over a 0018 wire. This was upsized to a [place].  5-French sheath was inserted.  A Cobra II catheter was inserted.  The celiac axis was selected over the [facility] wire and angiography was performed.  The catheter was advanced into the hepatic artery. A [place] micro catheter was advanced into the left hepatic artery and imaging was performed demonstrating prompt filling of the large pseudoaneurysm.  Three 4 mm platinum embolization coils within deployed in the distal main left hepatic artery.  Sub selection into the medial segment branch was not performed because the vessel looked markedly abnormal and embolization proximal to the bifurcation would be required for ensuring successful embolization proximal to the injury site.  Repeat angiography was performed in the left hepatic artery.  The right hepatic artery was selected over a micro wire. Angiography was performed.  The segmental branch to the anterior segment of the right lobe of the liver was selected and subsequent angiography was performed.  Filling of the pseudoaneurysm from the right hepatic artery branches could not be demonstrated.  The micro  catheter was then re-advanced into the left hepatic artery and angiography confirms occlusion of the vessel and no filling of the pseudoaneurysm.  The micro catheter was removed.  Injection through the [place] II catheter position in the main hepatic artery was then also performed failing to demonstrate filling of the aneurysm.  The catheter and sheath were removed.  Hemostasis was achieved direct pressure.  Findings: Initial imaging demonstrates expected branching anatomy of the common pedicle artery.  Splenic artery does not fill.  The patient has a very atrophic spleen on imaging. Filling of the large pseudoaneurysm in the left lobe of the  liver is noted.  Selective injection of the left hepatic artery confirms filling of the large pseudoaneurysm.  After embolization, there is markedly reduced flow within the left hepatic artery.  Selective injection of the right hepatic artery and anterior segmental branch demonstrates no evidence of filling of the large pseudoaneurysm or contrast extravasation.  Repeat injection of the left back artery demonstrates successful occlusion of this vessel.  Final injection through the Cobra II catheter in the common hepatic artery demonstrates resolution of the pseudoaneurysm.  Complications: None.  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  The main left hepatic artery was occluded at the branch point into the medial and lateral segments.  Original Report Authenticated By: Donavan Burnet, M.D.   Dg Abd Acute W/chest  03/17/2012  *RADIOLOGY REPORT*  Clinical Data: Mid abdominal pain and nausea.  ACUTE ABDOMEN SERIES (ABDOMEN 2 VIEW & CHEST 1 VIEW)  Comparison: Chest 09/29/2011  Findings: Stable appearance of cardiac pacemaker and postoperative changes in the mediastinum.  Shallow inspiration.  Cardiac enlargement with normal pulmonary vascularity for technique. Linear atelectasis or fibrosis in the lung bases.  No focal consolidation.  No blunting of costophrenic angles.  No pneumothorax.  Gas and stool scattered throughout the colon.  No small or large bowel distension.  No free intra-abdominal air.  No abnormal air fluid levels.  No radiopaque stones.  Mild degenerative changes in the spine and hips.  IMPRESSION: Shallow inspiration with linear atelectasis or fibrosis in the lung bases, stable since previous study.  Nonobstructive bowel gas pattern.  Original Report Authenticated By: Marlon Pel, M.D.    Assessment/Plan: S/p coil embolization (L)hepatic artery. Discussed with Dr. Carolynne Edouard this am, post embo/infarction fever more likely than infection.  Hgb  stable even after hep gtt started last pm. Will cont to follow along.     LOS: 2 days    Brayton El PA-C 03/18/2012 9:08 AM

## 2012-03-18 NOTE — Progress Notes (Signed)
ANTICOAGULATION CONSULT NOTE - Follow Up Consult  Pharmacy Consult for heparin Indication: atrial fibrillation, St Jude MVR  Allergies  Allergen Reactions  . Codeine Nausea And Vomiting    Patient Measurements: Height: 5\' 1"  (154.9 cm) Weight: 134 lb 11.2 oz (61.1 kg) IBW/kg (Calculated) : 47.8   Vital Signs: Temp: 98.9 F (37.2 C) (06/28 0800) Temp src: Oral (06/28 0800) BP: 145/87 mmHg (06/28 0600) Pulse Rate: 101  (06/28 0600)  Labs:  Basename 03/18/12 0200 03/17/12 2315 03/17/12 2200 03/17/12 1434 03/17/12 1215 03/17/12 0030 03/17/12 0025  HGB 7.6* -- 7.7* -- -- -- --  HCT 22.1* -- 22.8* 17.2* -- -- --  PLT 160 -- 170 199 -- -- --  APTT -- -- -- -- -- -- --  LABPROT -- 16.7* -- -- 16.7* 29.5* --  INR -- 1.33 -- -- 1.33 2.75* --  HEPARINUNFRC 0.31 -- -- -- -- -- --  CREATININE 0.76 -- -- -- -- -- 0.95  CKTOTAL -- -- -- -- -- -- --  CKMB -- -- -- -- -- -- --  TROPONINI -- -- -- -- -- -- --    Estimated Creatinine Clearance: 54.9 ml/min (by C-G formula based on Cr of 0.76).   Medications: Scheduled    . ceFAZolin      .  ceFAZolin (ANCEF) IV  1 g Intravenous Once  . fentaNYL      . furosemide  20 mg Intravenous Once  . lidocaine      . midazolam      . pantoprazole (PROTONIX) IV  40 mg Intravenous QHS  Infusions:     . sodium chloride 20 mL/hr at 03/17/12 1245  . dextrose 5 % and 0.9 % NaCl with KCl 20 mEq/L 100 mL/hr at 03/18/12 0429  . 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  Pt is also followed for infective endocarditis.  Presents 6/27 with hepatic bleed and therapeutic INR of 2.75 (goal 2.5 - 3.5) which required reversal.  She was given Vitamin K, FFP, and FEIBA.  Embolization L hepatic artery was completed on 6/27.  Heparin bridge was started 6/27 despite recent bleeding d/t high risk of clotting.  Heparin level is therapeutic at 0.36  CBC:  Hgb low but stable, platelets  decreasing but still wnl.  Noted plan for 2 unit PRBC transfusion on 6/28  Goal of Therapy:  Heparin level 0.3-0.7 units/ml Monitor platelets by anticoagulation protocol: Yes   Plan:   Continue Heparin IV infusion at 900 units/hr (9 ml/hr)  Daily heparin level  Continue to monitor H&H and platelets  Lynann Beaver PharmD, BCPS Pager [phone] 03/18/2012 11:46 AM

## 2012-03-19 DIAGNOSIS — T827XXA Infection and inflammatory reaction due to other cardiac and vascular devices, implants and grafts, initial encounter: Secondary | ICD-10-CM

## 2012-03-19 LAB — TYPE AND SCREEN
Antibody Screen: NEGATIVE
Unit division: 0
Unit division: 0

## 2012-03-19 LAB — CBC
HCT: 29.4 % — ABNORMAL LOW (ref 36.0–46.0)
HCT: 30.2 % — ABNORMAL LOW (ref 36.0–46.0)
Hemoglobin: 10.4 g/dL — ABNORMAL LOW (ref 12.0–15.0)
MCH: 30.2 pg (ref 26.0–34.0)
MCHC: 34.7 g/dL (ref 30.0–36.0)
MCV: 89.6 fL (ref 78.0–100.0)
Platelets: 160 10*3/uL (ref 150–400)
Platelets: 163 10*3/uL (ref 150–400)
Platelets: 168 10*3/uL (ref 150–400)
RBC: 3.46 MIL/uL — ABNORMAL LOW (ref 3.87–5.11)
RBC: 3.44 MIL/uL — ABNORMAL LOW (ref 3.87–5.11)
RBC: 3.37 MIL/uL — ABNORMAL LOW (ref 3.87–5.11)
RDW: 16.6 % — ABNORMAL HIGH (ref 11.5–15.5)
RDW: 16.7 % — ABNORMAL HIGH (ref 11.5–15.5)
RDW: 16.6 % — ABNORMAL HIGH (ref 11.5–15.5)
WBC: 15.4 10*3/uL — ABNORMAL HIGH (ref 4.0–10.5)
WBC: 14.7 10*3/uL — ABNORMAL HIGH (ref 4.0–10.5)
WBC: 14 10*3/uL — ABNORMAL HIGH (ref 4.0–10.5)
WBC: 12.9 10*3/uL — ABNORMAL HIGH (ref 4.0–10.5)

## 2012-03-19 LAB — BASIC METABOLIC PANEL
Calcium: 8.9 mg/dL (ref 8.4–10.5)
GFR calc Af Amer: >90 mL/min (ref >90)
GFR calc non Af Amer: 85 mL/min — ABNORMAL LOW (ref >90)
Glucose, Bld: 125 mg/dL — ABNORMAL HIGH (ref 70–99)
Potassium: 3.5 mEq/L (ref 3.5–5.1)
Sodium: 133 mEq/L — ABNORMAL LOW (ref 135–145)

## 2012-03-19 LAB — HEPARIN LEVEL (UNFRACTIONATED): Heparin Unfractionated: 0.39 IU/mL (ref 0.30–0.70)

## 2012-03-19 MED ORDER — POTASSIUM CHLORIDE CRYS ER 20 MEQ PO TBCR
40.0000 meq | EXTENDED_RELEASE_TABLET | Freq: Once | ORAL | Status: AC
  Administered 2012-03-19: 40 meq via ORAL
  Filled 2012-03-19: qty 1

## 2012-03-19 NOTE — Progress Notes (Signed)
ANTICOAGULATION CONSULT NOTE - Follow Up Consult  Pharmacy Consult for heparin Indication: atrial fibrillation, St Jude MVR  Allergies  Allergen Reactions  . Codeine Nausea And Vomiting    Patient Measurements: Height: 5\' 1"  (154.9 cm) Weight: 134 lb 11.2 oz (61.1 kg) IBW/kg (Calculated) : 47.8   Vital Signs: Temp: 99.8 F (37.7 C) (06/29 0300) Temp src: Oral (06/29 0000) BP: 131/44 mmHg (06/29 0300) Pulse Rate: 86  (06/29 0300)  Labs:  Basename 03/19/12 0250 03/19/12 0234 03/18/12 2000 03/18/12 1620 03/18/12 1000 03/18/12 0845 03/18/12 0200 03/17/12 2315 03/17/12 1215 03/17/12 0025  HGB -- 10.2* 10.6* -- -- -- -- -- -- --  HCT -- 29.4* 30.5* -- -- 22.5* -- -- -- --  PLT -- 160 176 -- -- 175 -- -- -- --  APTT -- -- -- -- -- -- -- -- -- --  LABPROT -- -- -- 16.6* -- -- -- 16.7* 16.7* --  INR -- -- -- 1.32 -- -- -- 1.33 1.33 --  HEPARINUNFRC 0.39 -- -- -- 0.36 -- 0.31 -- -- --  CREATININE 0.71 -- -- -- -- -- 0.76 -- -- 0.95  CKTOTAL -- -- -- -- -- -- -- -- -- --  CKMB -- -- -- -- -- -- -- -- -- --  TROPONINI -- -- -- -- -- -- -- -- -- --    Estimated Creatinine Clearance: 54.9 ml/min (by C-G formula based on Cr of 0.71).   Medications: Scheduled     . furosemide  20 mg Intravenous Once  . LORazepam      . LORazepam  0.5 mg Oral Once  . pantoprazole (PROTONIX) IV  40 mg Intravenous QHS  Infusions:     . sodium chloride 20 mL/hr at 03/17/12 1245  . dextrose 5 % and 0.9 % NaCl with KCl 20 mEq/L 50 mL/hr (03/18/12 1737)  . heparin 900 Units/hr (03/18/12 2134)    Assessment:  71 YOF on warfarin PTA for chronic afib and mechanical MVR.  Pt is also followed for infective endocarditis.  Presents 6/27 with hepatic bleed and therapeutic INR of 2.75 (goal 2.5 - 3.5) which required reversal.  She was given Vitamin K, FFP, and FEIBA.  Embolization L hepatic artery was completed on 6/27.  Heparin bridge was started 6/27 despite recent bleeding d/t high risk of  clotting.  Heparin level is therapeutic at 0.39  CBC:  Hgb low but stable s/p 2 unit PRBC transfusion on 6/28, platelets remain wnl.  Goal of Therapy:  Heparin level 0.3-0.7 units/ml Monitor platelets by anticoagulation protocol: Yes   Plan:   Continue Heparin IV infusion at 900 units/hr (9 ml/hr)  Daily heparin level  Continue to monitor H&H and platelets  Lynann Beaver PharmD, BCPS Pager [phone] 03/19/2012 7:24 AM

## 2012-03-19 NOTE — Progress Notes (Signed)
Subjective: Lt hepatic artery embo 6/27 Pt doing well hgb stable Less abd pain  Objective: Vital signs in last 24 hours: Temp:  [98.6 F (37 C)-101.3 F (38.5 C)] 98.7 F (37.1 C) (06/29 0800) Pulse Rate:  [79-102] 86  (06/29 0300) Resp:  [21-34] 34  (06/29 0300) BP: (120-164)/(44-77) 131/44 mmHg (06/29 0300) SpO2:  [91 %-98 %] 93 % (06/29 0300) Last BM Date: 03/16/12  Intake/Output from previous day: 06/28 0701 - 06/29 0700 In: 3011.5 [P.O.:240; I.V.:1852; Blood:919.5] Out: 4350 [Urine:4350] Intake/Output this shift:    PE: fever this am: 100.5; now 98 Abd soft; sl tender B low abd; + BS Rt groin NT; no bleeding; no hematoma Rt foot 2+ pulses hgb stable 10.2  Lab Results:   Basename 03/19/12 0805 03/19/12 0234  WBC 14.7* 15.4*  HGB 10.2* 10.2*  HCT 30.4* 29.4*  PLT 163 160   BMET  Basename 03/19/12 0250 03/18/12 0200  NA 133* 133*  K 3.5 3.8  CL 102 103  CO2 25 23  GLUCOSE 125* 132*  BUN 6 9  CREATININE 0.71 0.76  CALCIUM 8.9 8.5   PT/INR  Basename 03/18/12 1620 03/17/12 2315  LABPROT 16.6* 16.7*  INR 1.32 1.33   ABG No results found for this basename: PHART:2,PCO2:2,PO2:2,HCO3:2 in the last 72 hours  Studies/Results: Ir Angiogram Visceral Selective  03/18/2012  **ADDENDUM** CREATED: 03/18/2012 13:23:08  Upon further review of the imaging, 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  There are three areas in the gastroepiploic artery with saccular dilatation.  There are also suspected to be branches of the right hepatic artery within the medial liver that are aneurysmal.  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  **END ADDENDUM** SIGNED BY: Marlowe Aschoff. Hoss, M.D.   03/17/2012  *RADIOLOGY REPORT*  Clinical Data/Indication: LIVER INJURY.  ACTIVE EXTRAVASATION FROM AN INJURY IN THE LEFT LOBE.  TRANSCATHETER THERAPY EMBOLIZATION,ARTERIOGRAPHY,ADDITIONAL ARTERIOGRAPHY,IR ULTRASOUND  GUIDANCE VASC ACCESS RIGHT,SELECTIVE VISCERAL ARTERIOGRAPHY  Sedation: Versed 2 mg, Fentanyl 100 mg.  Total Moderate Sedation Time: 100 minutes.  Contrast Volume: 61 ml Omnipaque-300.  Additional Medications: Cefazolin 1 gram.  Fluoroscopy Time: 9.9 minutes.  Procedure: The procedure, risks, benefits, and alternatives were explained to the patient. Questions regarding the procedure were encouraged and answered. The patient understands and consents to the procedure.  The right groin was prepped with betadine in [name] sterile fashion, and [name] sterile drape was applied covering the operative field. [name] sterile gown and sterile gloves were used for the procedure.  Under sonographic guidance, [name] micropuncture needle was inserted into the right common femoral artery and removed over [name] 0018 wire. This was upsized to [name] [place].  5-French sheath was inserted.  [name] Cobra II catheter was inserted.  The celiac axis was selected over the [facility] wire and angiography was performed.  The catheter was advanced into the hepatic artery. [name] Progreat micro catheter was advanced into the left hepatic artery and imaging was performed demonstrating prompt filling of the large pseudoaneurysm.  Three 4 mm platinum embolization coils within deployed in the distal main left hepatic artery.  Sub selection into the medial segment branch was not performed because the vessel looked markedly abnormal and embolization proximal to the bifurcation would be required for ensuring successful embolization proximal to the injury site.  Repeat angiography was performed in the left hepatic artery.  The right hepatic artery was selected over [name] micro wire. Angiography was performed.  The segmental branch  to the anterior segment of the right lobe of the liver was selected and subsequent angiography was performed.  Filling of the pseudoaneurysm from the right hepatic artery branches could not be demonstrated.  The micro catheter was then re-advanced into the left hepatic  artery and angiography confirms occlusion of the vessel and no filling of the pseudoaneurysm.  The micro catheter was removed.  Injection through the [place] II catheter position in the main hepatic artery was then also performed failing to demonstrate filling of the aneurysm.  The catheter and sheath were removed.  Hemostasis was achieved direct pressure.  Findings: Initial imaging demonstrates expected branching anatomy of the common pedicle artery.  Splenic artery does not fill.  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  Selective injection of the left hepatic artery confirms filling of the large pseudoaneurysm.  After embolization, there is markedly reduced flow within the left hepatic artery.  Selective injection of the right hepatic artery and anterior segmental branch demonstrates no evidence of filling of the large pseudoaneurysm or contrast extravasation.  Repeat injection of the left back artery demonstrates successful occlusion of this vessel.  Final injection through the Cobra II catheter in the common hepatic artery demonstrates resolution of the pseudoaneurysm.  Complications: None.  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  The main left hepatic artery was occluded at the branch point into the medial and lateral segments.  Original Report Authenticated By: Donavan Burnet, M.D.   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  There are three areas in the gastroepiploic artery with saccular dilatation.  There are also suspected to be branches of the right hepatic artery within the medial liver that are aneurysmal.  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  **END  ADDENDUM** SIGNED BY: Marlowe Aschoff. Hoss, M.D.   03/17/2012  *RADIOLOGY REPORT*  Clinical Data/Indication: LIVER INJURY.  ACTIVE EXTRAVASATION FROM AN INJURY IN THE LEFT LOBE.  TRANSCATHETER THERAPY EMBOLIZATION,ARTERIOGRAPHY,ADDITIONAL ARTERIOGRAPHY,IR ULTRASOUND GUIDANCE VASC ACCESS RIGHT,SELECTIVE VISCERAL ARTERIOGRAPHY  Sedation: Versed 2 mg, Fentanyl 100 mg.  Total Moderate Sedation Time: 100 minutes.  Contrast Volume: 61 ml Omnipaque-300.  Additional Medications: Cefazolin 1 gram.  Fluoroscopy Time: 9.9 minutes.  Procedure: The procedure, risks, benefits, and alternatives were explained to the patient. Questions regarding the procedure were encouraged and answered. The patient understands and consents to the procedure.  The right groin was prepped with betadine in [name] sterile fashion, and [name] sterile drape was applied covering the operative field. [name] sterile gown and sterile gloves were used for the procedure.  Under sonographic guidance, [name] micropuncture needle was inserted into the right common femoral artery and removed over [name] 0018 wire. This was upsized to [name] [place].  5-French sheath was inserted.  [name] Cobra II catheter was inserted.  The celiac axis was selected over the [facility] wire and angiography was performed.  The catheter was advanced into the hepatic artery. [name] Progreat micro catheter was advanced into the left hepatic artery and imaging was performed demonstrating prompt filling of the large pseudoaneurysm.  Three 4 mm platinum embolization coils within deployed in the distal main left hepatic artery.  Sub selection into the medial segment branch was not performed because the  vessel looked markedly abnormal and embolization proximal to the bifurcation would be required for ensuring successful embolization proximal to the injury site.  Repeat angiography was performed in the left hepatic artery.  The right hepatic artery was selected over [name] micro wire. Angiography was performed.  The segmental branch to the  anterior segment of the right lobe of the liver was selected and subsequent angiography was performed.  Filling of the pseudoaneurysm from the right hepatic artery branches could not be demonstrated.  The micro catheter was then re-advanced into the left hepatic artery and angiography confirms occlusion of the vessel and no filling of the pseudoaneurysm.  The micro catheter was removed.  Injection through the [place] II catheter position in the main hepatic artery was then also performed failing to demonstrate filling of the aneurysm.  The catheter and sheath were removed.  Hemostasis was achieved direct pressure.  Findings: Initial imaging demonstrates expected branching anatomy of the common pedicle artery.  Splenic artery does not fill.  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  Selective injection of the left hepatic artery confirms filling of the large pseudoaneurysm.  After embolization, there is markedly reduced flow within the left hepatic artery.  Selective injection of the right hepatic artery and anterior segmental branch demonstrates no evidence of filling of the large pseudoaneurysm or contrast extravasation.  Repeat injection of the left back artery demonstrates successful occlusion of this vessel.  Final injection through the Cobra II catheter in the common hepatic artery demonstrates resolution of the pseudoaneurysm.  Complications: None.  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  The main left hepatic artery was occluded at the branch point into the medial and lateral segments.  Original Report Authenticated By: Donavan Burnet, M.D.   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  There are three areas  in the gastroepiploic artery with saccular dilatation.  There are also suspected to be branches of the right hepatic artery within the medial liver that are aneurysmal.  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  **END ADDENDUM** SIGNED BY: Marlowe Aschoff. Hoss, M.D.   03/17/2012  *RADIOLOGY REPORT*  Clinical Data/Indication: LIVER INJURY.  ACTIVE EXTRAVASATION FROM AN INJURY IN THE LEFT LOBE.  TRANSCATHETER THERAPY EMBOLIZATION,ARTERIOGRAPHY,ADDITIONAL ARTERIOGRAPHY,IR ULTRASOUND GUIDANCE VASC ACCESS RIGHT,SELECTIVE VISCERAL ARTERIOGRAPHY  Sedation: Versed 2 mg, Fentanyl 100 mg.  Total Moderate Sedation Time: 100 minutes.  Contrast Volume: 61 ml Omnipaque-300.  Additional Medications: Cefazolin 1 gram.  Fluoroscopy Time: 9.9 minutes.  Procedure: The procedure, risks, benefits, and alternatives were explained to the patient. Questions regarding the procedure were encouraged and answered. The patient understands and consents to the procedure.  The right groin was prepped with betadine in [name] sterile fashion, and [name] sterile drape was applied covering the operative field. [name] sterile gown and sterile gloves were used for the procedure.  Under sonographic guidance, [name] micropuncture needle was inserted into the right common femoral artery and removed over [name] 0018 wire. This was upsized to [name] [place].  5-French sheath was inserted.  [name] Cobra II catheter was inserted.  The celiac axis was selected over the [facility] wire and angiography was performed.  The catheter was advanced into the hepatic artery. [name]  Progreat micro catheter was advanced into the left hepatic artery and imaging was performed demonstrating prompt filling of the large pseudoaneurysm.  Three 4 mm platinum embolization coils within deployed in the distal main left hepatic artery.  Sub selection into the medial segment branch was not performed because the vessel looked markedly abnormal and embolization proximal to the bifurcation  would be required for ensuring successful embolization proximal to the injury site.  Repeat angiography was performed in the left hepatic artery.  The right hepatic artery was selected over [name] micro wire. Angiography was performed.  The segmental branch to the anterior segment of the right lobe of the liver was selected and subsequent angiography was performed.  Filling of the pseudoaneurysm from the right hepatic artery branches could not be demonstrated.  The micro catheter was then re-advanced into the left hepatic artery and angiography confirms occlusion of the vessel and no filling of the pseudoaneurysm.  The micro catheter was removed.  Injection through the [place] II catheter position in the main hepatic artery was then also performed failing to demonstrate filling of the aneurysm.  The catheter and sheath were removed.  Hemostasis was achieved direct pressure.  Findings: Initial imaging demonstrates expected branching anatomy of the common pedicle artery.  Splenic artery does not fill.  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  Selective injection of the left hepatic artery confirms filling of the large pseudoaneurysm.  After embolization, there is markedly reduced flow within the left hepatic artery.  Selective injection of the right hepatic artery and anterior segmental branch demonstrates no evidence of filling of the large pseudoaneurysm or contrast extravasation.  Repeat injection of the left back artery demonstrates successful occlusion of this vessel.  Final injection through the Cobra II catheter in the common hepatic artery demonstrates resolution of the pseudoaneurysm.  Complications: None.  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  The main left hepatic artery was occluded at the branch point into the medial and lateral segments.  Original Report Authenticated  By: Donavan Burnet, M.D.   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  There are three areas in the gastroepiploic artery with saccular dilatation.  There are also suspected to be branches of the right hepatic artery within the medial liver that are aneurysmal.  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  **END ADDENDUM** SIGNED BY: Marlowe Aschoff. Hoss, M.D.   03/17/2012  *RADIOLOGY REPORT*  Clinical Data/Indication: LIVER INJURY.  ACTIVE EXTRAVASATION FROM AN INJURY IN THE LEFT LOBE.  TRANSCATHETER THERAPY EMBOLIZATION,ARTERIOGRAPHY,ADDITIONAL ARTERIOGRAPHY,IR ULTRASOUND GUIDANCE VASC ACCESS RIGHT,SELECTIVE VISCERAL ARTERIOGRAPHY  Sedation: Versed 2 mg, Fentanyl 100 mg.  Total Moderate Sedation Time: 100 minutes.  Contrast Volume: 61 ml Omnipaque-300.  Additional Medications: Cefazolin 1 gram.  Fluoroscopy Time: 9.9 minutes.  Procedure: The procedure, risks, benefits, and alternatives were explained to the patient. Questions regarding the procedure were encouraged and answered. The patient understands and consents to the procedure.  The right groin was prepped with betadine in [name] sterile fashion, and [name] sterile drape was applied covering the operative field. [name] sterile gown and sterile gloves were used for the procedure.  Under sonographic guidance, [name] micropuncture needle was inserted into the right common  femoral artery and removed over [name] 0018 wire. This was upsized to [name] [place].  5-French sheath was inserted.  [name] Cobra II catheter was inserted.  The celiac axis was selected over the [facility] wire and angiography was performed.  The catheter was advanced into the hepatic artery. [name] Progreat micro catheter was advanced into the left hepatic artery and imaging was performed demonstrating prompt filling of the large  pseudoaneurysm.  Three 4 mm platinum embolization coils within deployed in the distal main left hepatic artery.  Sub selection into the medial segment branch was not performed because the vessel looked markedly abnormal and embolization proximal to the bifurcation would be required for ensuring successful embolization proximal to the injury site.  Repeat angiography was performed in the left hepatic artery.  The right hepatic artery was selected over [name] micro wire. Angiography was performed.  The segmental branch to the anterior segment of the right lobe of the liver was selected and subsequent angiography was performed.  Filling of the pseudoaneurysm from the right hepatic artery branches could not be demonstrated.  The micro catheter was then re-advanced into the left hepatic artery and angiography confirms occlusion of the vessel and no filling of the pseudoaneurysm.  The micro catheter was removed.  Injection through the [place] II catheter position in the main hepatic artery was then also performed failing to demonstrate filling of the aneurysm.  The catheter and sheath were removed.  Hemostasis was achieved direct pressure.  Findings: Initial imaging demonstrates expected branching anatomy of the common pedicle artery.  Splenic artery does not fill.  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  Selective injection of the left hepatic artery confirms filling of the large pseudoaneurysm.  After embolization, there is markedly reduced flow within the left hepatic artery.  Selective injection of the right hepatic artery and anterior segmental branch demonstrates no evidence of filling of the large pseudoaneurysm or contrast extravasation.  Repeat injection of the left back artery demonstrates successful occlusion of this vessel.  Final injection through the Cobra II catheter in the common hepatic artery demonstrates resolution of the  pseudoaneurysm.  Complications: None.  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  The main left hepatic artery was occluded at the branch point into the medial and lateral segments.  Original Report Authenticated By: Donavan Burnet, M.D.   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  There are three areas in the gastroepiploic artery with saccular dilatation.  There are also suspected to be branches of the right hepatic artery within the medial liver that are aneurysmal.  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  **END ADDENDUM** SIGNED BY: Marlowe Aschoff. Hoss, M.D.   03/17/2012  *RADIOLOGY REPORT*  Clinical Data/Indication: LIVER INJURY.  ACTIVE EXTRAVASATION FROM AN INJURY IN THE LEFT LOBE.  TRANSCATHETER THERAPY EMBOLIZATION,ARTERIOGRAPHY,ADDITIONAL ARTERIOGRAPHY,IR ULTRASOUND GUIDANCE VASC ACCESS RIGHT,SELECTIVE VISCERAL ARTERIOGRAPHY  Sedation: Versed 2 mg, Fentanyl 100 mg.  Total Moderate Sedation Time: 100 minutes.  Contrast Volume: 61 ml Omnipaque-300.  Additional Medications: Cefazolin 1 gram.  Fluoroscopy Time: 9.9 minutes.  Procedure: The procedure, risks, benefits, and alternatives were explained to the patient. Questions regarding the procedure were encouraged and answered. The patient understands and consents to the procedure.  The right groin was prepped with betadine in [name] sterile fashion, and [name] sterile drape was applied covering the operative field. [name] sterile gown and sterile gloves were used for the procedure.  Under sonographic guidance, [name] micropuncture needle was inserted into the right common femoral artery and removed over [name] 0018 wire. This was upsized to [name] [place].  5-French sheath was inserted.  [name] Cobra II catheter was inserted.  The celiac axis was selected over  the [facility] wire and angiography was performed.  The catheter was advanced into the hepatic artery. [name] Progreat micro catheter was advanced into the left hepatic artery and imaging was performed demonstrating prompt filling of the large pseudoaneurysm.  Three 4 mm platinum embolization coils within deployed in the distal main left hepatic artery.  Sub selection into the medial segment branch was not performed because the vessel looked markedly abnormal and embolization proximal to the bifurcation would be required for ensuring successful embolization proximal to the injury site.  Repeat angiography was performed in the left hepatic artery.  The right hepatic artery was selected over [name] micro wire. Angiography was performed.  The segmental branch to the anterior segment of the right lobe of the liver was selected and subsequent angiography was performed.  Filling of the pseudoaneurysm from the right hepatic artery branches could not be demonstrated.  The micro catheter was then re-advanced into the left hepatic artery and angiography confirms occlusion of the vessel and no filling of the pseudoaneurysm.  The micro catheter was removed.  Injection through the [place] II catheter position in the main hepatic artery was then also performed failing to demonstrate filling of the aneurysm.  The catheter and sheath were removed.  Hemostasis was achieved direct pressure.  Findings: Initial imaging demonstrates expected branching anatomy of the common pedicle artery.  Splenic artery does not fill.  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  Selective injection of the left hepatic artery confirms filling of the large pseudoaneurysm.  After embolization, there is markedly reduced flow within the left hepatic artery.  Selective injection of the right hepatic artery and anterior segmental branch demonstrates no evidence of filling of the large  pseudoaneurysm or contrast extravasation.  Repeat injection of the left back artery demonstrates successful occlusion of this vessel.  Final injection through the Cobra II catheter in the common hepatic artery demonstrates resolution of the pseudoaneurysm.  Complications: None.  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  The main left hepatic artery was occluded at the branch point into the medial and lateral segments.  Original Report Authenticated By: Donavan Burnet, M.D.   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  There are three areas in the gastroepiploic artery with saccular dilatation.  There are also suspected to be branches of the right hepatic artery within the medial liver that are aneurysmal.  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  **END ADDENDUM** SIGNED BY: Marlowe Aschoff. Hoss, M.D.   03/17/2012  *RADIOLOGY REPORT*  Clinical Data/Indication: LIVER INJURY.  ACTIVE EXTRAVASATION FROM AN INJURY IN THE LEFT LOBE.  TRANSCATHETER THERAPY EMBOLIZATION,ARTERIOGRAPHY,ADDITIONAL ARTERIOGRAPHY,IR ULTRASOUND GUIDANCE VASC ACCESS RIGHT,SELECTIVE VISCERAL ARTERIOGRAPHY  Sedation: Versed 2 mg, Fentanyl 100 mg.  Total Moderate Sedation Time:  100 minutes.  Contrast Volume: 61 ml Omnipaque-300.  Additional Medications: Cefazolin 1 gram.  Fluoroscopy Time: 9.9 minutes.  Procedure: The procedure, risks, benefits, and alternatives were explained to the patient. Questions regarding the procedure were encouraged and answered. The patient understands and consents to the procedure.  The right groin was prepped with betadine in [name] sterile fashion, and [name] sterile drape was applied covering the operative field. [name] sterile gown and sterile gloves were used for the procedure.  Under  sonographic guidance, [name] micropuncture needle was inserted into the right common femoral artery and removed over [name] 0018 wire. This was upsized to [name] [place].  5-French sheath was inserted.  [name] Cobra II catheter was inserted.  The celiac axis was selected over the [facility] wire and angiography was performed.  The catheter was advanced into the hepatic artery. [name] Progreat micro catheter was advanced into the left hepatic artery and imaging was performed demonstrating prompt filling of the large pseudoaneurysm.  Three 4 mm platinum embolization coils within deployed in the distal main left hepatic artery.  Sub selection into the medial segment branch was not performed because the vessel looked markedly abnormal and embolization proximal to the bifurcation would be required for ensuring successful embolization proximal to the injury site.  Repeat angiography was performed in the left hepatic artery.  The right hepatic artery was selected over [name] micro wire. Angiography was performed.  The segmental branch to the anterior segment of the right lobe of the liver was selected and subsequent angiography was performed.  Filling of the pseudoaneurysm from the right hepatic artery branches could not be demonstrated.  The micro catheter was then re-advanced into the left hepatic artery and angiography confirms occlusion of the vessel and no filling of the pseudoaneurysm.  The micro catheter was removed.  Injection through the [place] II catheter position in the main hepatic artery was then also performed failing to demonstrate filling of the aneurysm.  The catheter and sheath were removed.  Hemostasis was achieved direct pressure.  Findings: Initial imaging demonstrates expected branching anatomy of the common pedicle artery.  Splenic artery does not fill.  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  Selective injection of the left hepatic artery confirms  filling of the large pseudoaneurysm.  After embolization, there is markedly reduced flow within the left hepatic artery.  Selective injection of the right hepatic artery and anterior segmental branch demonstrates no evidence of filling of the large pseudoaneurysm or contrast extravasation.  Repeat injection of the left back artery demonstrates successful occlusion of this vessel.  Final injection through the Cobra II catheter in the common hepatic artery demonstrates resolution of the pseudoaneurysm.  Complications: None.  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  The main left hepatic artery was occluded at the branch point into the medial and lateral segments.  Original Report Authenticated By: Donavan Burnet, M.D.   Ir Fluoro Guide Cv Line Right  03/17/2012  *RADIOLOGY REPORT*  Clinical Data: Peritoneal hemorrhage  PICC LINE PLACEMENT WITH ULTRASOUND AND FLUOROSCOPIC  GUIDANCE  Fluoroscopy Time: 0.2 minutes.  The right arm was prepped with chlorhexidine, draped in the usual sterile fashion using maximum barrier technique (cap and mask, sterile gown, sterile gloves, large sterile sheet, hand hygiene and cutaneous antisepsis) and infiltrated locally with 1% Lidocaine.  Ultrasound demonstrated patency of the right basilic vein, and this  was documented with an image.  Under real-time ultrasound guidance, this vein was accessed with [name] 21 gauge micropuncture needle and image documentation was performed.  The needle was exchanged over [name] guidewire for [name] peel-away sheath through which [name] five [place] double lumen PICC trimmed to 32 cm was advanced, positioned with its tip at the lower SVC/right atrial junction.  Fluoroscopy during the procedure and fluoro spot radiograph confirms appropriate catheter position.  The catheter was flushed, secured to the skin with Prolene sutures, and covered with [name] sterile dressing.  Complications:  None  IMPRESSION: Successful right arm  PICC line placement with ultrasound and fluoroscopic guidance.  The catheter is ready for use.  Original Report Authenticated By: Donavan Burnet, M.D.   Ir US Guide Vasc Access Right  03/17/2012  *RADIOLOGY REPORT*  Clinical Data: Peritoneal hemorrhage  PICC LINE PLACEMENT WITH ULTRASOUND AND FLUOROSCOPIC  GUIDANCE  Fluoroscopy Time: 0.2 minutes.  The right arm was prepped with chlorhexidine, draped in the usual sterile fashion using maximum barrier technique (cap and mask, sterile gown, sterile gloves, large sterile sheet, hand hygiene and cutaneous antisepsis) and infiltrated locally with 1% Lidocaine.  Ultrasound demonstrated patency of the right basilic vein, and this was documented with an image.  Under real-time ultrasound guidance, this vein was accessed with [name] 21 gauge micropuncture needle and image documentation was performed.  The needle was exchanged over [name] guidewire for [name] peel-away sheath through which [name] five [place] double lumen PICC trimmed to 32 cm was advanced, positioned with its tip at the lower SVC/right atrial junction.  Fluoroscopy during the procedure and fluoro spot radiograph confirms appropriate catheter position.  The catheter was flushed, secured to the skin with Prolene sutures, and covered with [name] sterile dressing.  Complications:  None  IMPRESSION: Successful right arm PICC line placement with ultrasound and fluoroscopic guidance.  The catheter is ready for use.  Original Report Authenticated By: Donavan Burnet, M.D.   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  There are three areas in the gastroepiploic artery with saccular dilatation.  There are also suspected to be branches of the right hepatic artery within the medial liver that are aneurysmal.  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   **END ADDENDUM** SIGNED BY: Marlowe Aschoff. Hoss, M.D.   03/17/2012  *RADIOLOGY REPORT*  Clinical Data/Indication: LIVER INJURY.  ACTIVE EXTRAVASATION FROM AN INJURY IN THE LEFT LOBE.  TRANSCATHETER THERAPY EMBOLIZATION,ARTERIOGRAPHY,ADDITIONAL ARTERIOGRAPHY,IR ULTRASOUND GUIDANCE VASC ACCESS RIGHT,SELECTIVE VISCERAL ARTERIOGRAPHY  Sedation: Versed 2 mg, Fentanyl 100 mg.  Total Moderate Sedation Time: 100 minutes.  Contrast Volume: 61 ml Omnipaque-300.  Additional Medications: Cefazolin 1 gram.  Fluoroscopy Time: 9.9 minutes.  Procedure: The procedure, risks, benefits, and alternatives were explained to the patient. Questions regarding the procedure were encouraged and answered. The patient understands and consents to the procedure.  The right groin was prepped with betadine in [name] sterile fashion, and [name] sterile drape was applied covering the operative field. [name] sterile gown and sterile gloves were used for the procedure.  Under sonographic guidance, [name] micropuncture needle was inserted into the right common femoral artery and removed over [name] 0018 wire. This was upsized to [name] [place].  5-French sheath was inserted.  [name] Cobra II catheter was inserted.  The celiac axis was selected over the [facility] wire and angiography  was performed.  The catheter was advanced into the hepatic artery. [name] Progreat micro catheter was advanced into the left hepatic artery and imaging was performed demonstrating prompt filling of the large pseudoaneurysm.  Three 4 mm platinum embolization coils within deployed in the distal main left hepatic artery.  Sub selection into the medial segment branch was not performed because the vessel looked markedly abnormal and embolization proximal to the bifurcation would be required for ensuring successful embolization proximal to the injury site.  Repeat angiography was performed in the left hepatic artery.  The right hepatic artery was selected over [name] micro wire. Angiography was performed.  The segmental branch to  the anterior segment of the right lobe of the liver was selected and subsequent angiography was performed.  Filling of the pseudoaneurysm from the right hepatic artery branches could not be demonstrated.  The micro catheter was then re-advanced into the left hepatic artery and angiography confirms occlusion of the vessel and no filling of the pseudoaneurysm.  The micro catheter was removed.  Injection through the [place] II catheter position in the main hepatic artery was then also performed failing to demonstrate filling of the aneurysm.  The catheter and sheath were removed.  Hemostasis was achieved direct pressure.  Findings: Initial imaging demonstrates expected branching anatomy of the common pedicle artery.  Splenic artery does not fill.  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  Selective injection of the left hepatic artery confirms filling of the large pseudoaneurysm.  After embolization, there is markedly reduced flow within the left hepatic artery.  Selective injection of the right hepatic artery and anterior segmental branch demonstrates no evidence of filling of the large pseudoaneurysm or contrast extravasation.  Repeat injection of the left back artery demonstrates successful occlusion of this vessel.  Final injection through the Cobra II catheter in the common hepatic artery demonstrates resolution of the pseudoaneurysm.  Complications: None.  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  The main left hepatic artery was occluded at the branch point into the medial and lateral segments.  Original Report Authenticated By: Donavan Burnet, M.D.    Anti-infectives: Anti-infectives     Start     Dose/Rate Route Frequency Ordered Stop   03/17/12 1330   ceFAZolin (ANCEF) IVPB 1 g/50 mL premix        1 g 100 mL/hr over 30 Minutes Intravenous  Once 03/17/12 1322 03/17/12  1355   03/17/12 1223   ceFAZolin (ANCEF) 1-5 GM-% IVPB     Comments: KIRKMAN, JENNIFER: cabinet override         03/17/12 1223 03/18/12 0029          Assessment/Plan: s/p Left hepatic artery embolization 6/27  Pt doing well hgb stable abd only has minimal tenderness  Carrie [name] 03/19/2012

## 2012-03-19 NOTE — Progress Notes (Signed)
Subjective: No CP/SOB  Objective:  Temp:  [98.6 F (37 C)-101.3 F (38.5 C)] 100.3 F (37.9 C) (06/29 1619) Pulse Rate:  [79-123] 123  (06/29 1800) Resp:  [21-34] 30  (06/29 1800) BP: (120-157)/(44-87) 150/71 mmHg (06/29 1800) SpO2:  [91 %-97 %] 95 % (06/29 1800) Weight change:   Intake/Output from previous day: 06/28 0701 - 06/29 0700 In: 3011.5 [P.O.:240; I.V.:1852; Blood:919.5] Out: 4350 [Urine:4350]  Intake/Output from this shift: Total I/O In: 708 [I.V.:708] Out: 1200 [Urine:1200]  Physical Exam: General appearance: alert, cooperative, appears stated age and no distress Neck: no adenopathy, no carotid bruit, no JVD, supple, symmetrical, trachea midline and thyroid not enlarged, symmetric, no tenderness/mass/nodules Lungs: clear to auscultation bilaterally Heart: irregularly irregular rhythm Abdomen: Soft, non tender, decraesed BS  Lab Results: Results for orders placed during the hospital encounter of 03/16/12 (from the past 48 hour(s))  CBC     Status: Abnormal   Collection Time   03/17/12 10:00 PM      Component Value Range Comment   WBC 14.1 (*) 4.0 - 10.5 K/uL    RBC 2.64 (*) 3.87 - 5.11 MIL/uL    Hemoglobin 7.7 (*) 12.0 - 15.0 g/dL    HCT 56.2 (*) 13.0 - 46.0 %    MCV 86.4  78.0 - 100.0 fL    MCH 29.2  26.0 - 34.0 pg    MCHC 33.8  30.0 - 36.0 g/dL    RDW 86.5 (*) 78.4 - 15.5 %    Platelets 170  150 - 400 K/uL   PROTIME-INR     Status: Abnormal   Collection Time   03/17/12 11:15 PM      Component Value Range Comment   Prothrombin Time 16.7 (*) 11.6 - 15.2 seconds    INR 1.33  0.00 - 1.49   BASIC METABOLIC PANEL     Status: Abnormal   Collection Time   03/18/12  2:00 AM      Component Value Range Comment   Sodium 133 (*) 135 - 145 mEq/L    Potassium 3.8  3.5 - 5.1 mEq/L    Chloride 103  96 - 112 mEq/L    CO2 23  19 - 32 mEq/L    Glucose, Bld 132 (*) 70 - 99 mg/dL    BUN 9  6 - 23 mg/dL    Creatinine, Ser 6.96  0.50 - 1.10 mg/dL    Calcium 8.5  8.4  - 10.5 mg/dL    GFR calc non Af Amer 83 (*) >90 mL/min    GFR calc Af Amer >90  >90 mL/min   HEPARIN LEVEL (UNFRACTIONATED)     Status: Normal   Collection Time   03/18/12  2:00 AM      Component Value Range Comment   Heparin Unfractionated 0.31  0.30 - 0.70 IU/mL   CBC     Status: Abnormal   Collection Time   03/18/12  2:00 AM      Component Value Range Comment   WBC 16.1 (*) 4.0 - 10.5 K/uL    RBC 2.55 (*) 3.87 - 5.11 MIL/uL    Hemoglobin 7.6 (*) 12.0 - 15.0 g/dL    HCT 29.5 (*) 28.4 - 46.0 %    MCV 86.7  78.0 - 100.0 fL    MCH 29.8  26.0 - 34.0 pg    MCHC 34.4  30.0 - 36.0 g/dL    RDW 13.2 (*) 44.0 - 15.5 %    Platelets 160  150 -  400 K/uL   CBC     Status: Abnormal   Collection Time   03/18/12  8:45 AM      Component Value Range Comment   WBC 15.4 (*) 4.0 - 10.5 K/uL    RBC 2.58 (*) 3.87 - 5.11 MIL/uL    Hemoglobin 7.6 (*) 12.0 - 15.0 g/dL    HCT 96.0 (*) 45.4 - 46.0 %    MCV 87.2  78.0 - 100.0 fL    MCH 29.5  26.0 - 34.0 pg    MCHC 33.8  30.0 - 36.0 g/dL    RDW 09.8 (*) 11.9 - 15.5 %    Platelets 175  150 - 400 K/uL   PREPARE RBC (CROSSMATCH)     Status: Normal   Collection Time   03/18/12  9:00 AM      Component Value Range Comment   Order Confirmation ORDER PROCESSED BY BLOOD BANK     CULTURE, BLOOD (ROUTINE X 2)     Status: Normal (Preliminary result)   Collection Time   03/18/12  9:40 AM      Component Value Range Comment   Specimen Description BLOOD RIGHT ARM      Special Requests BOTTLES DRAWN AEROBIC AND ANAEROBIC 5CC EA      Culture  Setup Time 03/18/2012 15:43      Culture        Value:        BLOOD CULTURE RECEIVED NO GROWTH TO DATE CULTURE WILL BE HELD FOR 5 DAYS BEFORE ISSUING A FINAL NEGATIVE REPORT   Report Status PENDING     CULTURE, BLOOD (ROUTINE X 2)     Status: Normal (Preliminary result)   Collection Time   03/18/12  9:45 AM      Component Value Range Comment   Specimen Description BLOOD RIGHT HAND      Special Requests BOTTLES DRAWN AEROBIC  AND ANAEROBIC 5CC EA      Culture  Setup Time 03/18/2012 15:43      Culture        Value:        BLOOD CULTURE RECEIVED NO GROWTH TO DATE CULTURE WILL BE HELD FOR 5 DAYS BEFORE ISSUING A FINAL NEGATIVE REPORT   Report Status PENDING     HEPARIN LEVEL (UNFRACTIONATED)     Status: Normal   Collection Time   03/18/12 10:00 AM      Component Value Range Comment   Heparin Unfractionated 0.36  0.30 - 0.70 IU/mL   PROTIME-INR     Status: Abnormal   Collection Time   03/18/12  4:20 PM      Component Value Range Comment   Prothrombin Time 16.6 (*) 11.6 - 15.2 seconds    INR 1.32  0.00 - 1.49   GLUCOSE, CAPILLARY     Status: Abnormal   Collection Time   03/18/12  7:56 PM      Component Value Range Comment   Glucose-Capillary 120 (*) 70 - 99 mg/dL   CBC     Status: Abnormal   Collection Time   03/18/12  8:00 PM      Component Value Range Comment   WBC 15.6 (*) 4.0 - 10.5 K/uL    RBC 3.49 (*) 3.87 - 5.11 MIL/uL    Hemoglobin 10.6 (*) 12.0 - 15.0 g/dL    HCT 14.7 (*) 82.9 - 46.0 %    MCV 87.4  78.0 - 100.0 fL    MCH 30.4  26.0 - 34.0 pg  MCHC 34.8  30.0 - 36.0 g/dL    RDW 45.4 (*) 09.8 - 15.5 %    Platelets 176  150 - 400 K/uL   PROCALCITONIN     Status: Normal   Collection Time   03/18/12  8:00 PM      Component Value Range Comment   Procalcitonin 0.17     CBC     Status: Abnormal   Collection Time   03/19/12  2:34 AM      Component Value Range Comment   WBC 15.4 (*) 4.0 - 10.5 K/uL    RBC 3.37 (*) 3.87 - 5.11 MIL/uL    Hemoglobin 10.2 (*) 12.0 - 15.0 g/dL    HCT 11.9 (*) 14.7 - 46.0 %    MCV 87.2  78.0 - 100.0 fL    MCH 30.3  26.0 - 34.0 pg    MCHC 34.7  30.0 - 36.0 g/dL    RDW 82.9 (*) 56.2 - 15.5 %    Platelets 160  150 - 400 K/uL   HEPARIN LEVEL (UNFRACTIONATED)     Status: Normal   Collection Time   03/19/12  2:50 AM      Component Value Range Comment   Heparin Unfractionated 0.39  0.30 - 0.70 IU/mL   BASIC METABOLIC PANEL     Status: Abnormal   Collection Time    03/19/12  2:50 AM      Component Value Range Comment   Sodium 133 (*) 135 - 145 mEq/L    Potassium 3.5  3.5 - 5.1 mEq/L    Chloride 102  96 - 112 mEq/L    CO2 25  19 - 32 mEq/L    Glucose, Bld 125 (*) 70 - 99 mg/dL    BUN 6  6 - 23 mg/dL    Creatinine, Ser 1.30  0.50 - 1.10 mg/dL    Calcium 8.9  8.4 - 86.5 mg/dL    GFR calc non Af Amer 85 (*) >90 mL/min    GFR calc Af Amer >90  >90 mL/min   CBC     Status: Abnormal   Collection Time   03/19/12  8:05 AM      Component Value Range Comment   WBC 14.7 (*) 4.0 - 10.5 K/uL    RBC 3.46 (*) 3.87 - 5.11 MIL/uL    Hemoglobin 10.2 (*) 12.0 - 15.0 g/dL    HCT 78.4 (*) 69.6 - 46.0 %    MCV 87.9  78.0 - 100.0 fL    MCH 29.5  26.0 - 34.0 pg    MCHC 33.6  30.0 - 36.0 g/dL    RDW 29.5 (*) 28.4 - 15.5 %    Platelets 163  150 - 400 K/uL   PROCALCITONIN     Status: Normal   Collection Time   03/19/12  8:21 AM      Component Value Range Comment   Procalcitonin 0.14     CBC     Status: Abnormal   Collection Time   03/19/12  2:59 PM      Component Value Range Comment   WBC 14.0 (*) 4.0 - 10.5 K/uL    RBC 3.44 (*) 3.87 - 5.11 MIL/uL    Hemoglobin 10.4 (*) 12.0 - 15.0 g/dL    HCT 13.2 (*) 44.0 - 46.0 %    MCV 88.7  78.0 - 100.0 fL    MCH 30.2  26.0 - 34.0 pg    MCHC 34.1  30.0 - 36.0  g/dL    RDW 47.8 (*) 29.5 - 15.5 %    Platelets 168  150 - 400 K/uL     Imaging: Imaging results have been reviewed  Assessment/Plan:   1. Active Problems: 2.  S/P mitral valve replacement, [facility] [employer] 3.  Presence of permanent cardiac pacemaker 4.  Chronic anticoagulation, ( INR goal 2.5-3.5 under normal circumstances for [facility] [employer] MVR) 5.  A-fib 6.  Hemorrhage intraabdominal 7.   Time Spent Directly with Patient:  20 minutes  Length of Stay:  LOS: 3 days   HGB stable on IV heparin. Probably OK to initiate coumadin A/C per pharmacy with INR goal 2.5-3.0 with CAF and MVR. Still febrile. Monitor HGB closely. Will see again on Monday.  Runell Gess 03/19/2012, 6:56 PM

## 2012-03-19 NOTE — Progress Notes (Signed)
Subjective: Feels better. Less sob. Less abd pain  Objective: Vital signs in last 24 hours: Temp:  [98.6 F (37 C)-101.3 F (38.5 C)] 98.7 F (37.1 C) (06/29 0800) Pulse Rate:  [79-102] 86  (06/29 0300) Resp:  [21-34] 34  (06/29 0300) BP: (120-164)/(44-77) 131/44 mmHg (06/29 0300) SpO2:  [91 %-98 %] 93 % (06/29 0300) Last BM Date: 03/16/12  Intake/Output from previous day: 06/28 0701 - 06/29 0700 In: 3011.5 [P.O.:240; I.V.:1852; Blood:919.5] Out: 4350 [Urine:4350] Intake/Output this shift:    Resp: clear to auscultation bilaterally GI: soft, mild tenderness of upper abd. good bs  Lab Results:   Basename 03/19/12 0234 03/18/12 2000  WBC 15.4* 15.6*  HGB 10.2* 10.6*  HCT 29.4* 30.5*  PLT 160 176   BMET  Basename 03/19/12 0250 03/18/12 0200  NA 133* 133*  K 3.5 3.8  CL 102 103  CO2 25 23  GLUCOSE 125* 132*  BUN 6 9  CREATININE 0.71 0.76  CALCIUM 8.9 8.5   PT/INR  Basename 03/18/12 1620 03/17/12 2315  LABPROT 16.6* 16.7*  INR 1.32 1.33   ABG No results found for this basename: PHART:2,PCO2:2,PO2:2,HCO3:2 in the last 72 hours  Studies/Results: Ir Angiogram Visceral Selective  03/18/2012  **ADDENDUM** CREATED: 03/18/2012 13:23:08  Upon further review of the imaging, 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  There are three areas in the gastroepiploic artery with saccular dilatation.  There are also suspected to be branches of the right hepatic artery within the medial liver that are aneurysmal.  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  **END ADDENDUM** SIGNED BY: Marlowe Aschoff. Hoss, M.D.   03/17/2012  *RADIOLOGY REPORT*  Clinical Data/Indication: LIVER INJURY.  ACTIVE EXTRAVASATION FROM AN INJURY IN THE LEFT LOBE.  TRANSCATHETER THERAPY EMBOLIZATION,ARTERIOGRAPHY,ADDITIONAL ARTERIOGRAPHY,IR ULTRASOUND GUIDANCE VASC ACCESS RIGHT,SELECTIVE VISCERAL ARTERIOGRAPHY  Sedation: Versed 2 mg,  Fentanyl 100 mg.  Total Moderate Sedation Time: 100 minutes.  Contrast Volume: 61 ml Omnipaque-300.  Additional Medications: Cefazolin 1 gram.  Fluoroscopy Time: 9.9 minutes.  Procedure: The procedure, risks, benefits, and alternatives were explained to the patient. Questions regarding the procedure were encouraged and answered. The patient understands and consents to the procedure.  The right groin was prepped with betadine in a sterile fashion, and a sterile drape was applied covering the operative field. A sterile gown and sterile gloves were used for the procedure.  Under sonographic guidance, a micropuncture needle was inserted into the right common femoral artery and removed over a 0018 wire. This was upsized to a [place].  5-French sheath was inserted.  A Cobra II catheter was inserted.  The celiac axis was selected over the [facility] wire and angiography was performed.  The catheter was advanced into the hepatic artery. A Progreat micro catheter was advanced into the left hepatic artery and imaging was performed demonstrating prompt filling of the large pseudoaneurysm.  Three 4 mm platinum embolization coils within deployed in the distal main left hepatic artery.  Sub selection into the medial segment branch was not performed because the vessel looked markedly abnormal and embolization proximal to the bifurcation would be required for ensuring successful embolization proximal to the injury site.  Repeat angiography was performed in the left hepatic artery.  The right hepatic artery was selected over a micro wire. Angiography was performed.  The segmental branch to the anterior segment of the right lobe of the liver was selected and subsequent angiography was performed.  Filling of the  pseudoaneurysm from the right hepatic artery branches could not be demonstrated.  The micro catheter was then re-advanced into the left hepatic artery and angiography confirms occlusion of the vessel and no filling of the  pseudoaneurysm.  The micro catheter was removed.  Injection through the [place] II catheter position in the main hepatic artery was then also performed failing to demonstrate filling of the aneurysm.  The catheter and sheath were removed.  Hemostasis was achieved direct pressure.  Findings: Initial imaging demonstrates expected branching anatomy of the common pedicle artery.  Splenic artery does not fill.  The patient has a very atrophic spleen on imaging. 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  Selective injection of the left hepatic artery confirms filling of the large pseudoaneurysm.  After embolization, there is markedly reduced flow within the left hepatic artery.  Selective injection of the right hepatic artery and anterior segmental branch demonstrates no evidence of filling of the large pseudoaneurysm or contrast extravasation.  Repeat injection of the left back artery demonstrates successful occlusion of this vessel.  Final injection through the Cobra II catheter in the common hepatic artery demonstrates resolution of the pseudoaneurysm.  Complications: None.  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  The main left hepatic artery was occluded at the branch point into the medial and lateral segments.  Original Report Authenticated By: Donavan Burnet, M.D.   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  There are three areas in the gastroepiploic artery with saccular dilatation.  There are also suspected to be branches of the right hepatic artery within the medial liver that are aneurysmal.  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  **END ADDENDUM** SIGNED BY: Marlowe Aschoff. Hoss, M.D.   03/17/2012  *RADIOLOGY REPORT*   Clinical Data/Indication: LIVER INJURY.  ACTIVE EXTRAVASATION FROM AN INJURY IN THE LEFT LOBE.  TRANSCATHETER THERAPY EMBOLIZATION,ARTERIOGRAPHY,ADDITIONAL ARTERIOGRAPHY,IR ULTRASOUND GUIDANCE VASC ACCESS RIGHT,SELECTIVE VISCERAL ARTERIOGRAPHY  Sedation: Versed 2 mg, Fentanyl 100 mg.  Total Moderate Sedation Time: 100 minutes.  Contrast Volume: 61 ml Omnipaque-300.  Additional Medications: Cefazolin 1 gram.  Fluoroscopy Time: 9.9 minutes.  Procedure: The procedure, risks, benefits, and alternatives were explained to the patient. Questions regarding the procedure were encouraged and answered. The patient understands and consents to the procedure.  The right groin was prepped with betadine in a sterile fashion, and a sterile drape was applied covering the operative field. A sterile gown and sterile gloves were used for the procedure.  Under sonographic guidance, a micropuncture needle was inserted into the right common femoral artery and removed over a 0018 wire. This was upsized to a [place].  5-French sheath was inserted.  A Cobra II catheter was inserted.  The celiac axis was selected over the [facility] wire and angiography was performed.  The catheter was advanced into the hepatic artery. A Progreat micro catheter was advanced into the left hepatic artery and imaging was performed demonstrating prompt filling of the large pseudoaneurysm.  Three 4 mm platinum embolization coils within deployed in the distal main left hepatic artery.  Sub selection into the medial segment branch was not performed because the vessel looked markedly abnormal and embolization proximal to the bifurcation would be required for ensuring successful embolization proximal to the injury site.  Repeat angiography was performed in the left hepatic artery.  The right hepatic artery was selected over a micro wire. Angiography was performed.  The segmental branch to the anterior segment of the right lobe of the liver was selected and subsequent  angiography was performed.  Filling of the pseudoaneurysm from the right hepatic artery branches could not be demonstrated.  The micro catheter was then re-advanced into the left hepatic artery and angiography confirms occlusion of the vessel and no filling of the pseudoaneurysm.  The micro catheter was removed.  Injection through the [place] II catheter position in the main hepatic artery was then also performed failing to demonstrate filling of the aneurysm.  The catheter and sheath were removed.  Hemostasis was achieved direct pressure.  Findings: Initial imaging demonstrates expected branching anatomy of the common pedicle artery.  Splenic artery does not fill.  The patient has a very atrophic spleen on imaging. 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  Selective injection of the left hepatic artery confirms filling of the large pseudoaneurysm.  After embolization, there is markedly reduced flow within the left hepatic artery.  Selective injection of the right hepatic artery and anterior segmental branch demonstrates no evidence of filling of the large pseudoaneurysm or contrast extravasation.  Repeat injection of the left back artery demonstrates successful occlusion of this vessel.  Final injection through the Cobra II catheter in the common hepatic artery demonstrates resolution of the pseudoaneurysm.  Complications: None.  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  The main left hepatic artery was occluded at the branch point into the medial and lateral segments.  Original Report Authenticated By: Donavan Burnet, M.D.   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  There are three areas in the gastroepiploic artery with saccular dilatation.  There are also  suspected to be branches of the right hepatic artery within the medial liver that are aneurysmal.  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  **END ADDENDUM** SIGNED BY: Marlowe Aschoff. Hoss, M.D.   03/17/2012  *RADIOLOGY REPORT*  Clinical Data/Indication: LIVER INJURY.  ACTIVE EXTRAVASATION FROM AN INJURY IN THE LEFT LOBE.  TRANSCATHETER THERAPY EMBOLIZATION,ARTERIOGRAPHY,ADDITIONAL ARTERIOGRAPHY,IR ULTRASOUND GUIDANCE VASC ACCESS RIGHT,SELECTIVE VISCERAL ARTERIOGRAPHY  Sedation: Versed 2 mg, Fentanyl 100 mg.  Total Moderate Sedation Time: 100 minutes.  Contrast Volume: 61 ml Omnipaque-300.  Additional Medications: Cefazolin 1 gram.  Fluoroscopy Time: 9.9 minutes.  Procedure: The procedure, risks, benefits, and alternatives were explained to the patient. Questions regarding the procedure were encouraged and answered. The patient understands and consents to the procedure.  The right groin was prepped with betadine in a sterile fashion, and a sterile drape was applied covering the operative field. A sterile gown and sterile gloves were used for the procedure.  Under sonographic guidance, a micropuncture needle was inserted into the right common femoral artery and removed over a 0018 wire. This was upsized to a [place].  5-French sheath was inserted.  A Cobra II catheter was inserted.  The celiac axis was selected over the [place] wire and angiography was performed.  The catheter was advanced into the hepatic artery. A Progreat micro catheter was advanced into the left hepatic artery and imaging was performed demonstrating prompt filling of the large pseudoaneurysm.  Three  4 mm platinum embolization coils within deployed in the distal main left hepatic artery.  Sub selection into the medial segment branch was not performed because the vessel looked markedly abnormal and embolization proximal to the bifurcation would be required for ensuring successful embolization proximal to the  injury site.  Repeat angiography was performed in the left hepatic artery.  The right hepatic artery was selected over a micro wire. Angiography was performed.  The segmental branch to the anterior segment of the right lobe of the liver was selected and subsequent angiography was performed.  Filling of the pseudoaneurysm from the right hepatic artery branches could not be demonstrated.  The micro catheter was then re-advanced into the left hepatic artery and angiography confirms occlusion of the vessel and no filling of the pseudoaneurysm.  The micro catheter was removed.  Injection through the [place] II catheter position in the main hepatic artery was then also performed failing to demonstrate filling of the aneurysm.  The catheter and sheath were removed.  Hemostasis was achieved direct pressure.  Findings: Initial imaging demonstrates expected branching anatomy of the common pedicle artery.  Splenic artery does not fill.  The patient has a very atrophic spleen on imaging. 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  Selective injection of the left hepatic artery confirms filling of the large pseudoaneurysm.  After embolization, there is markedly reduced flow within the left hepatic artery.  Selective injection of the right hepatic artery and anterior segmental branch demonstrates no evidence of filling of the large pseudoaneurysm or contrast extravasation.  Repeat injection of the left back artery demonstrates successful occlusion of this vessel.  Final injection through the Cobra II catheter in the common hepatic artery demonstrates resolution of the pseudoaneurysm.  Complications: None.  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  The main left hepatic artery was occluded at the branch point into the medial and lateral segments.  Original Report Authenticated By: Donavan Burnet, M.D.   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  There are three areas in the gastroepiploic artery with saccular dilatation.  There are also suspected to be branches of the right hepatic artery within the medial liver that are aneurysmal.  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  **END ADDENDUM** SIGNED BY: Marlowe Aschoff. Hoss, M.D.   03/17/2012  *RADIOLOGY REPORT*  Clinical Data/Indication: LIVER INJURY.  ACTIVE EXTRAVASATION FROM AN INJURY IN THE LEFT LOBE.  TRANSCATHETER THERAPY EMBOLIZATION,ARTERIOGRAPHY,ADDITIONAL ARTERIOGRAPHY,IR ULTRASOUND GUIDANCE VASC ACCESS RIGHT,SELECTIVE VISCERAL ARTERIOGRAPHY  Sedation: Versed 2 mg, Fentanyl 100 mg.  Total Moderate Sedation Time: 100 minutes.  Contrast Volume: 61 ml Omnipaque-300.  Additional Medications: Cefazolin 1 gram.  Fluoroscopy Time: 9.9 minutes.  Procedure: The procedure, risks, benefits, and alternatives were explained to the patient. Questions regarding the procedure were encouraged and answered. The patient understands and consents to the procedure.  The right groin was prepped with betadine in a sterile fashion, and a sterile drape was applied covering the operative field. A sterile gown and sterile gloves were used for the procedure.  Under sonographic guidance, a micropuncture needle was inserted into the right common femoral artery and removed over a 0018 wire. This was upsized to a [place].  5-French sheath was inserted.  A Cobra II  catheter was inserted.  The celiac axis was selected over the [facility] wire and angiography was performed.  The catheter was advanced into the hepatic artery. A Progreat micro catheter was advanced into the left hepatic artery and imaging was performed demonstrating prompt filling of the large pseudoaneurysm.  Three 4 mm platinum embolization coils within deployed  in the distal main left hepatic artery.  Sub selection into the medial segment branch was not performed because the vessel looked markedly abnormal and embolization proximal to the bifurcation would be required for ensuring successful embolization proximal to the injury site.  Repeat angiography was performed in the left hepatic artery.  The right hepatic artery was selected over a micro wire. Angiography was performed.  The segmental branch to the anterior segment of the right lobe of the liver was selected and subsequent angiography was performed.  Filling of the pseudoaneurysm from the right hepatic artery branches could not be demonstrated.  The micro catheter was then re-advanced into the left hepatic artery and angiography confirms occlusion of the vessel and no filling of the pseudoaneurysm.  The micro catheter was removed.  Injection through the [place] II catheter position in the main hepatic artery was then also performed failing to demonstrate filling of the aneurysm.  The catheter and sheath were removed.  Hemostasis was achieved direct pressure.  Findings: Initial imaging demonstrates expected branching anatomy of the common pedicle artery.  Splenic artery does not fill.  The patient has a very atrophic spleen on imaging. 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  Selective injection of the left hepatic artery confirms filling of the large pseudoaneurysm.  After embolization, there is markedly reduced flow within the left hepatic artery.  Selective injection of the right hepatic artery and anterior segmental branch demonstrates no evidence of filling of the large pseudoaneurysm or contrast extravasation.  Repeat injection of the left back artery demonstrates successful occlusion of this vessel.  Final injection through the Cobra II catheter in the common hepatic artery demonstrates resolution of the pseudoaneurysm.  Complications: None.  IMPRESSION: Successful coil  embolization of a large pseudoaneurysm in the left hepatic lobe.  The main left hepatic artery was occluded at the branch point into the medial and lateral segments.  Original Report Authenticated By: Donavan Burnet, M.D.   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  There are three areas in the gastroepiploic artery with saccular dilatation.  There are also suspected to be branches of the right hepatic artery within the medial liver that are aneurysmal.  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  **END ADDENDUM** SIGNED BY: Marlowe Aschoff. Hoss, M.D.   03/17/2012  *RADIOLOGY REPORT*  Clinical Data/Indication: LIVER INJURY.  ACTIVE EXTRAVASATION FROM AN INJURY IN THE LEFT LOBE.  TRANSCATHETER THERAPY EMBOLIZATION,ARTERIOGRAPHY,ADDITIONAL ARTERIOGRAPHY,IR ULTRASOUND GUIDANCE VASC ACCESS RIGHT,SELECTIVE VISCERAL ARTERIOGRAPHY  Sedation: Versed 2 mg, Fentanyl 100 mg.  Total Moderate Sedation Time: 100 minutes.  Contrast Volume: 61 ml Omnipaque-300.  Additional Medications: Cefazolin 1 gram.  Fluoroscopy Time: 9.9 minutes.  Procedure: The procedure, risks, benefits, and alternatives were explained to the patient. Questions regarding the procedure were encouraged and answered. The patient understands and consents to the procedure.  The right groin was prepped with betadine in a sterile fashion, and a sterile drape was applied covering the operative field. A  sterile gown and sterile gloves were used for the procedure.  Under sonographic guidance, a micropuncture needle was inserted into the right common femoral artery and removed over a 0018 wire. This was upsized to a [place].  5-French sheath was inserted.  A Cobra II catheter was inserted.  The celiac axis was selected over the [facility] wire and angiography was performed.  The catheter was  advanced into the hepatic artery. A Progreat micro catheter was advanced into the left hepatic artery and imaging was performed demonstrating prompt filling of the large pseudoaneurysm.  Three 4 mm platinum embolization coils within deployed in the distal main left hepatic artery.  Sub selection into the medial segment branch was not performed because the vessel looked markedly abnormal and embolization proximal to the bifurcation would be required for ensuring successful embolization proximal to the injury site.  Repeat angiography was performed in the left hepatic artery.  The right hepatic artery was selected over a micro wire. Angiography was performed.  The segmental branch to the anterior segment of the right lobe of the liver was selected and subsequent angiography was performed.  Filling of the pseudoaneurysm from the right hepatic artery branches could not be demonstrated.  The micro catheter was then re-advanced into the left hepatic artery and angiography confirms occlusion of the vessel and no filling of the pseudoaneurysm.  The micro catheter was removed.  Injection through the [place] II catheter position in the main hepatic artery was then also performed failing to demonstrate filling of the aneurysm.  The catheter and sheath were removed.  Hemostasis was achieved direct pressure.  Findings: Initial imaging demonstrates expected branching anatomy of the common pedicle artery.  Splenic artery does not fill.  The patient has a very atrophic spleen on imaging. 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  Selective injection of the left hepatic artery confirms filling of the large pseudoaneurysm.  After embolization, there is markedly reduced flow within the left hepatic artery.  Selective injection of the right hepatic artery and anterior segmental branch demonstrates no evidence of filling of the large pseudoaneurysm or contrast extravasation.  Repeat injection of the left  back artery demonstrates successful occlusion of this vessel.  Final injection through the Cobra II catheter in the common hepatic artery demonstrates resolution of the pseudoaneurysm.  Complications: None.  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  The main left hepatic artery was occluded at the branch point into the medial and lateral segments.  Original Report Authenticated By: Donavan Burnet, M.D.   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  There are three areas in the gastroepiploic artery with saccular dilatation.  There are also suspected to be branches of the right hepatic artery within the medial liver that are aneurysmal.  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  **END ADDENDUM** SIGNED BY: Marlowe Aschoff. Hoss, M.D.   03/17/2012  *RADIOLOGY REPORT*  Clinical Data/Indication: LIVER INJURY.  ACTIVE EXTRAVASATION FROM AN INJURY IN THE LEFT LOBE.  TRANSCATHETER THERAPY EMBOLIZATION,ARTERIOGRAPHY,ADDITIONAL ARTERIOGRAPHY,IR ULTRASOUND GUIDANCE VASC ACCESS RIGHT,SELECTIVE VISCERAL ARTERIOGRAPHY  Sedation: Versed 2 mg, Fentanyl 100 mg.  Total Moderate Sedation Time: 100 minutes.  Contrast Volume: 61 ml Omnipaque-300.  Additional Medications: Cefazolin 1 gram.  Fluoroscopy Time: 9.9 minutes.  Procedure: The  procedure, risks, benefits, and alternatives were explained to the patient. Questions regarding the procedure were encouraged and answered. The patient understands and consents to the procedure.  The right groin was prepped with betadine in a sterile fashion, and a sterile drape was applied covering the operative field. A sterile gown and sterile gloves were used for the procedure.  Under sonographic guidance, a micropuncture needle was inserted into the right  common femoral artery and removed over a 0018 wire. This was upsized to a [place].  5-French sheath was inserted.  A Cobra II catheter was inserted.  The celiac axis was selected over the [facility] wire and angiography was performed.  The catheter was advanced into the hepatic artery. A Progreat micro catheter was advanced into the left hepatic artery and imaging was performed demonstrating prompt filling of the large pseudoaneurysm.  Three 4 mm platinum embolization coils within deployed in the distal main left hepatic artery.  Sub selection into the medial segment branch was not performed because the vessel looked markedly abnormal and embolization proximal to the bifurcation would be required for ensuring successful embolization proximal to the injury site.  Repeat angiography was performed in the left hepatic artery.  The right hepatic artery was selected over a micro wire. Angiography was performed.  The segmental branch to the anterior segment of the right lobe of the liver was selected and subsequent angiography was performed.  Filling of the pseudoaneurysm from the right hepatic artery branches could not be demonstrated.  The micro catheter was then re-advanced into the left hepatic artery and angiography confirms occlusion of the vessel and no filling of the pseudoaneurysm.  The micro catheter was removed.  Injection through the [place] II catheter position in the main hepatic artery was then also performed failing to demonstrate filling of the aneurysm.  The catheter and sheath were removed.  Hemostasis was achieved direct pressure.  Findings: Initial imaging demonstrates expected branching anatomy of the common pedicle artery.  Splenic artery does not fill.  The patient has a very atrophic spleen on imaging. 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  Selective injection of the left hepatic artery confirms filling of the large pseudoaneurysm.  After embolization, there is  markedly reduced flow within the left hepatic artery.  Selective injection of the right hepatic artery and anterior segmental branch demonstrates no evidence of filling of the large pseudoaneurysm or contrast extravasation.  Repeat injection of the left back artery demonstrates successful occlusion of this vessel.  Final injection through the Cobra II catheter in the common hepatic artery demonstrates resolution of the pseudoaneurysm.  Complications: None.  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  The main left hepatic artery was occluded at the branch point into the medial and lateral segments.  Original Report Authenticated By: Donavan Burnet, M.D.   Ir Fluoro Guide Cv Line Right  03/17/2012  *RADIOLOGY REPORT*  Clinical Data: Peritoneal hemorrhage  PICC LINE PLACEMENT WITH ULTRASOUND AND FLUOROSCOPIC  GUIDANCE  Fluoroscopy Time: 0.2 minutes.  The right arm was prepped with chlorhexidine, draped in the usual sterile fashion using maximum barrier technique (cap and mask, sterile gown, sterile gloves, large sterile sheet, hand hygiene and cutaneous antisepsis) and infiltrated locally with 1% Lidocaine.  Ultrasound demonstrated patency of the right basilic vein, and this was documented with an image.  Under real-time ultrasound guidance, this vein was accessed with a 21 gauge micropuncture needle and image  documentation was performed.  The needle was exchanged over a guidewire for a peel-away sheath through which a five [place] double lumen PICC trimmed to 32 cm was advanced, positioned with its tip at the lower SVC/right atrial junction.  Fluoroscopy during the procedure and fluoro spot radiograph confirms appropriate catheter position.  The catheter was flushed, secured to the skin with Prolene sutures, and covered with a sterile dressing.  Complications:  None  IMPRESSION: Successful right arm PICC line placement with ultrasound and fluoroscopic guidance.  The  catheter is ready for use.  Original Report Authenticated By: Donavan Burnet, M.D.   Ir US Guide Vasc Access Right  03/17/2012  *RADIOLOGY REPORT*  Clinical Data: Peritoneal hemorrhage  PICC LINE PLACEMENT WITH ULTRASOUND AND FLUOROSCOPIC  GUIDANCE  Fluoroscopy Time: 0.2 minutes.  The right arm was prepped with chlorhexidine, draped in the usual sterile fashion using maximum barrier technique (cap and mask, sterile gown, sterile gloves, large sterile sheet, hand hygiene and cutaneous antisepsis) and infiltrated locally with 1% Lidocaine.  Ultrasound demonstrated patency of the right basilic vein, and this was documented with an image.  Under real-time ultrasound guidance, this vein was accessed with a 21 gauge micropuncture needle and image documentation was performed.  The needle was exchanged over a guidewire for a peel-away sheath through which a five [place] double lumen PICC trimmed to 32 cm was advanced, positioned with its tip at the lower SVC/right atrial junction.  Fluoroscopy during the procedure and fluoro spot radiograph confirms appropriate catheter position.  The catheter was flushed, secured to the skin with Prolene sutures, and covered with a sterile dressing.  Complications:  None  IMPRESSION: Successful right arm PICC line placement with ultrasound and fluoroscopic guidance.  The catheter is ready for use.  Original Report Authenticated By: Donavan Burnet, M.D.   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  There are three areas in the gastroepiploic artery with saccular dilatation.  There are also suspected to be branches of the right hepatic artery within the medial liver that are aneurysmal.  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  **END ADDENDUM** SIGNED BY: Marlowe Aschoff. Hoss, M.D.   03/17/2012   *RADIOLOGY REPORT*  Clinical Data/Indication: LIVER INJURY.  ACTIVE EXTRAVASATION FROM AN INJURY IN THE LEFT LOBE.  TRANSCATHETER THERAPY EMBOLIZATION,ARTERIOGRAPHY,ADDITIONAL ARTERIOGRAPHY,IR ULTRASOUND GUIDANCE VASC ACCESS RIGHT,SELECTIVE VISCERAL ARTERIOGRAPHY  Sedation: Versed 2 mg, Fentanyl 100 mg.  Total Moderate Sedation Time: 100 minutes.  Contrast Volume: 61 ml Omnipaque-300.  Additional Medications: Cefazolin 1 gram.  Fluoroscopy Time: 9.9 minutes.  Procedure: The procedure, risks, benefits, and alternatives were explained to the patient. Questions regarding the procedure were encouraged and answered. The patient understands and consents to the procedure.  The right groin was prepped with betadine in a sterile fashion, and a sterile drape was applied covering the operative field. A sterile gown and sterile gloves were used for the procedure.  Under sonographic guidance, a micropuncture needle was inserted into the right common femoral artery and removed over a 0018 wire. This was upsized to a [place].  5-French sheath was inserted.  A Cobra II catheter was inserted.  The celiac axis was selected over the [facility] wire and angiography was performed.  The catheter was advanced into the hepatic artery. A [place] micro catheter was advanced into the left hepatic artery  and imaging was performed demonstrating prompt filling of the large pseudoaneurysm.  Three 4 mm platinum embolization coils within deployed in the distal main left hepatic artery.  Sub selection into the medial segment branch was not performed because the vessel looked markedly abnormal and embolization proximal to the bifurcation would be required for ensuring successful embolization proximal to the injury site.  Repeat angiography was performed in the left hepatic artery.  The right hepatic artery was selected over a micro wire. Angiography was performed.  The segmental branch to the anterior segment of the right lobe of the liver was selected  and subsequent angiography was performed.  Filling of the pseudoaneurysm from the right hepatic artery branches could not be demonstrated.  The micro catheter was then re-advanced into the left hepatic artery and angiography confirms occlusion of the vessel and no filling of the pseudoaneurysm.  The micro catheter was removed.  Injection through the [place] II catheter position in the main hepatic artery was then also performed failing to demonstrate filling of the aneurysm.  The catheter and sheath were removed.  Hemostasis was achieved direct pressure.  Findings: Initial imaging demonstrates expected branching anatomy of the common pedicle artery.  Splenic artery does not fill.  The patient has a very atrophic spleen on imaging. 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  Selective injection of the left hepatic artery confirms filling of the large pseudoaneurysm.  After embolization, there is markedly reduced flow within the left hepatic artery.  Selective injection of the right hepatic artery and anterior segmental branch demonstrates no evidence of filling of the large pseudoaneurysm or contrast extravasation.  Repeat injection of the left back artery demonstrates successful occlusion of this vessel.  Final injection through the Cobra II catheter in the common hepatic artery demonstrates resolution of the pseudoaneurysm.  Complications: None.  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  The main left hepatic artery was occluded at the branch point into the medial and lateral segments.  Original Report Authenticated By: Donavan Burnet, M.D.    Anti-infectives: Anti-infectives     Start     Dose/Rate Route Frequency Ordered Stop   03/17/12 1330   ceFAZolin (ANCEF) IVPB 1 g/50 mL premix        1 g 100 mL/hr over 30 Minutes Intravenous  Once 03/17/12 1322 03/17/12 1355   03/17/12 1223   ceFAZolin (ANCEF) 1-5 GM-% IVPB      Comments: KIRKMAN, JENNIFER: cabinet override         03/17/12 1223 03/18/12 0029          Assessment/Plan: s/p * No surgery found * Hg stable Continue heparin drip and monitor in icu bedrest  LOS: 3 days    TOTH III,Lilee Aldea S 03/19/2012

## 2012-03-19 NOTE — Progress Notes (Signed)
Name: Carrie [name] MRN: [id number] DOB: August 20, 1941    LOS: 3  Referring Provider:  CCS Reason for Referral:  Medical Mgmt ICU  PULMONARY / CRITICAL CARE MEDICINE  HPI:  [name] with PMH of HTN, Sick sinus syndrome, Anxiety, HLD, Afib, MVR on coumadin presented to [facility] ED 6/26 with a 3 week history of abdominal discomfort that she related to be reflux.  She had been seen by her primary care physician and treated with Zegerid. Only 2 weeks ago she had an episode of nausea and vomiting associated with abdominal pain that resolved after vomiting. Last p.m. she had dinner and then began having abrupt onset abdominal pain with associated nausea. Pain did not relieve and she presented to the emergency room at which time a CT demonstrated a contrast collection in the left lobe of the liver suggestive of active bleeding.  She denied abdominal injury, falls or other related incident that might cause bleeding.  In March of 2013 her hemoglobin was 12.6 and upon admission was noted to be 8.2. She was hemodynamically stable and admitted to the intensive care unit. PCCM consulted for ICU medical management.    Brief patient description:  71 year old female with a medical history of atrial fibrillation and mitral valve replacement on Coumadin who presented with acute abdominal pain.  CT of the abdomen concerning for hemorrhage within the liver.  Planned embolization by interventional radiology on 6/27.  Events Since Admission: 6/27 To IR for IR embolization of L hepatic artery 6/28 feveres. On cefazolin,. Cultured. PCT remains low   Current Status:   Holding hgb on IVheparin Fevers  + 101 tmax but curve appears decreasing  Vital Signs: Temp:  [98.6 F (37 C)-101.3 F (38.5 C)] 98.7 F (37.1 C) (06/29 0800) Pulse Rate:  [79-102] 86  (06/29 0300) Resp:  [21-34] 34  (06/29 0300) BP: (120-164)/(44-77) 131/44 mmHg (06/29 0300) SpO2:  [91 %-98 %] 93 % (06/29 0300)  Physical Examination: General:   Well-developed well-nourished in no acute distress Neuro:  Awake, alert, oriented / moving all extremities HEENT:  Mm pink / moist Cardiovascular:  S1-S2 irregularly irregular rate,controlled in the 50s/ 60s Lungs:  Respirations are even and nonlabored, lungs bilaterally are clear Abdomen:  Round/soft, bowel sounds x4 hypoactive Musculoskeletal:  No obvious deformity Skin:  Warm and dry, no edema  Active Problems:  S/P mitral valve replacement, St Jude  Presence of permanent cardiac pacemaker  Chronic anticoagulation, ( INR goal 2.5-3.5 under normal circumstances for ST Jude MVR)  A-fib  Hemorrhage intraabdominal   ASSESSMENT AND PLAN  PULMONARY No results found for this basename: PHART:5,PCO2:5,PCO2ART:5,PO2ART:5,HCO3:5,O2SAT:5 in the last 168 hours Ventilator Settings:   CXR:  No new chest x-ray. Atelectasis in the bases noted on CT of the abdomen ETT:    A:   Basilar atelectasis P:   -O2 to support sats > 92% -pulmonary hygiene  CARDIOVASCULAR No results found for this basename: TROPONINI:5,LATICACIDVEN:5, O2SATVEN:5,PROBNP:5 in the last 168 hours ECG:  HR 50's / 60's, irregular, PAC's Lines:   A:  Hx of HTN Hx of Afib on coumadin Hx of MVR  P:  -tele monitor -hold home metoprolol, lanoxin, coumadin for now - cards managing   RENAL  Lab 03/19/12 0250 03/18/12 0200 03/17/12 0025  NA 133* 133* 134*  K 3.5 3.8 --  CL 102 103 103  CO2 25 23 19   BUN 6 9 17   CREATININE 0.71 0.76 0.95  CALCIUM 8.9 8.5 9.3  MG -- -- --  PHOS -- -- --   Intake/Output      06/28 0701 - 06/29 0700 06/29 0701 - 06/30 0700   P.O. 240    I.V. (mL/kg) 1852 (30.3)    Blood 919.5    IV Piggyback     Total Intake(mL/kg) 3011.5 (49.3)    Urine (mL/kg/hr) 4350 (3)    Total Output 4350    Net -1338.5         Urine Occurrence 1 x     Foley:    A:   Mild Hyponatremia and hypokalemia   P:   - replete kcl 03/19/2012 -trend BMP -monitor renal function with relative volume  loss  GASTROINTESTINAL  Lab 03/17/12 0025  AST 113*  ALT 57*  ALKPHOS 116  BILITOT 0.5  PROT 7.1  ALBUMIN 3.1*   6/26 CT ABD/Pelvis:  Contrast collection in the left lobe of the liver suggesting focus of active extravasation likely due to pseudoaneurysm versus arterial portal shunting versus hemorrhagic tumor. Cystic changes in the lateral segment of the left lobe may represent mass lesion, bile duct dilatation, or hematomas. Large amount of free hemorrhage throughout the peritoneum.  A:   Hepatic / Peritoneal Hemorrhage -no clear injury.  ?spontaneous bleed.  INR within therapeutic range.   P:   -To IR for embolization -Rec's per CCS / IR  HEMATOLOGIC  Lab 03/19/12 0805 03/19/12 0234 03/18/12 2000 03/18/12 1620 03/18/12 0845 03/18/12 0200 03/17/12 2315 03/17/12 1215 03/17/12 0030  HGB 10.2* 10.2* 10.6* -- 7.6* 7.6* -- -- --  HCT 30.4* 29.4* 30.5* -- 22.5* 22.1* -- -- --  PLT 163 160 176 -- 175 160 -- -- --  INR -- -- -- 1.32 -- -- 1.33 1.33 2.75*  APTT -- -- -- -- -- -- -- -- --   A:   Acute blood loss anemia - s/p IR embolization and FFP and FEIB reversal on 03/16/2012 Coagulopathy -on coumadin as above   -on 6/29 - holding hgbwhile on  IV heparin  P:  --hold coumadin -heparin gtt per pharmacy -follow h/h  - - PRBC for hgb </= 6.9gm%    - exceptions are   -  if ACS susepcted/confirmed then transfuse for hgb </= 8.0gm%,  or    -  If septic shock first 24h and scvo2 < 70% then transfuse for hgb </= 9.0gm%   - active bleeding with hemodynamic instability, then transfuse regardless of hemoglobin value   At at all times try to transfuse 1 unit prbc as possible with exception of active hemorrhage     INFECTIOUS  Lab 03/19/12 0821 03/19/12 0805 03/19/12 0234 03/18/12 2000 03/18/12 0845 03/18/12 0200  WBC -- 14.7* 15.4* 15.6* 15.4* 16.1*  PROCALCITON 0.14 -- -- 0.17 -- --   Cultures: 6/28 BCx2>>>  Antibiotics: 6/27 Cefazolin>>>  A:   Fever - overnight 6/27.   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  Likely related to blood in abdomen.  However, concern for mycotic seeding must be considered given hx of valvular vegetation.    -on 6/29: ? Fever curve improving  P:   -monitor, trend leuks, fever curve an -await bc x1 from 6/29 to ensure no infectious component - monitor PCT trend prn  - repeat blood culture if temp  > 101F  ENDOCRINE  Lab 03/18/12 1956 03/16/12 2326  GLUCAP 120* 184*   A:   No acute issues   P:   -monitor glucose on BMP  NEUROLOGIC  A:   No acute issues.  P:   -supportive care  BEST PRACTICE / DISPOSITION Level of Care:  ICU Primary Service:  CCS Consultants:  PCCM Code Status:  Full Diet:  NPO DVT Px:  SCD's  GI Px:  protonix Skin Integrity:  intact Social / Family:  None at bedside.  Patient updated.       Dr. Kalman Shan, M.D., [facility].C.P Pulmonary and Critical Care Medicine Staff Physician  System [facility] Pager: [phone], If no answer or between  15:00h - 7:00h: call [phone]  03/19/2012 9:25 AM

## 2012-03-20 LAB — CBC
HCT: 30.1 % — ABNORMAL LOW (ref 36.0–46.0)
HCT: 30 % — ABNORMAL LOW (ref 36.0–46.0)
Hemoglobin: 10 g/dL — ABNORMAL LOW (ref 12.0–15.0)
MCHC: 33.3 g/dL (ref 30.0–36.0)
MCV: 89.3 fL (ref 78.0–100.0)
Platelets: 155 10*3/uL (ref 150–400)
RBC: 3.37 MIL/uL — ABNORMAL LOW (ref 3.87–5.11)
RBC: 3.33 MIL/uL — ABNORMAL LOW (ref 3.87–5.11)
RDW: 16.6 % — ABNORMAL HIGH (ref 11.5–15.5)
WBC: 12.5 10*3/uL — ABNORMAL HIGH (ref 4.0–10.5)

## 2012-03-20 LAB — HEPARIN LEVEL (UNFRACTIONATED)
Heparin Unfractionated: 0.25 IU/mL — ABNORMAL LOW (ref 0.30–0.70)
Heparin Unfractionated: 0.15 IU/mL — ABNORMAL LOW (ref 0.30–0.70)
Heparin Unfractionated: 0.4 IU/mL (ref 0.30–0.70)

## 2012-03-20 LAB — LACTIC ACID, PLASMA: Lactic Acid, Venous: 0.8 mmol/L (ref 0.5–2.2)

## 2012-03-20 MED ORDER — HEPARIN (PORCINE) IN NACL 100-0.45 UNIT/ML-% IJ SOLN
900.0000 [IU]/h | INTRAMUSCULAR | Status: DC
  Administered 2012-03-21 – 2012-03-25 (×5): 1200 [IU]/h via INTRAVENOUS
  Administered 2012-03-27: 900 [IU]/h via INTRAVENOUS
  Filled 2012-03-20 (×10): qty 250

## 2012-03-20 MED ORDER — HEPARIN BOLUS VIA INFUSION
1500.0000 [IU] | Freq: Once | INTRAVENOUS | Status: AC
  Administered 2012-03-20: 1500 [IU] via INTRAVENOUS
  Filled 2012-03-20: qty 1500

## 2012-03-20 MED ORDER — WARFARIN SODIUM 5 MG PO TABS
5.0000 mg | ORAL_TABLET | Freq: Once | ORAL | Status: AC
  Administered 2012-03-20: 5 mg via ORAL
  Filled 2012-03-20: qty 1

## 2012-03-20 MED ORDER — WARFARIN - PHARMACIST DOSING INPATIENT
Freq: Every day | Status: DC
  Administered 2012-03-20: 17:00:00

## 2012-03-20 MED ORDER — HEPARIN (PORCINE) IN NACL 100-0.45 UNIT/ML-% IJ SOLN
1000.0000 [IU]/h | INTRAMUSCULAR | Status: DC
  Administered 2012-03-20: 1000 [IU]/h via INTRAVENOUS
  Filled 2012-03-20: qty 250

## 2012-03-20 MED ORDER — LORAZEPAM 0.5 MG PO TABS
ORAL_TABLET | ORAL | Status: AC
  Filled 2012-03-20: qty 1

## 2012-03-20 MED ORDER — LORAZEPAM 0.5 MG PO TABS
0.5000 mg | ORAL_TABLET | Freq: Every day | ORAL | Status: DC
  Administered 2012-03-20 – 2012-03-28 (×9): 0.5 mg via ORAL
  Filled 2012-03-20 (×8): qty 1

## 2012-03-20 NOTE — Progress Notes (Signed)
ANTICOAGULATION CONSULT NOTE - Follow Up Consult  Pharmacy Consult for heparin Indication: atrial fibrillation, St Jude MVR  Labs:  Basename 03/20/12 1356 03/20/12 0815 03/20/12 0230 03/19/12 2030 03/19/12 0250 03/18/12 1620 03/18/12 0200 03/17/12 2315  HGB -- 10.0* 10.2* -- -- -- -- --  HCT -- 30.0* 30.1* 30.2* -- -- -- --  PLT -- 171 155 166 -- -- -- --  APTT -- -- -- -- -- -- -- --  LABPROT -- -- -- -- -- 16.6* -- 16.7*  INR -- -- -- -- -- 1.32 -- 1.33  HEPARINUNFRC 0.15* -- 0.25* -- 0.39 -- -- --  CREATININE -- -- -- -- 0.71 -- 0.76 --  CKTOTAL -- -- -- -- -- -- -- --  CKMB -- -- -- -- -- -- -- --  TROPONINI -- -- -- -- -- -- -- --    Medications: Infusions:     . sodium chloride 20 mL/hr at 03/17/12 1245  . dextrose 5 % and 0.9 % NaCl with KCl 20 mEq/L 50 mL (03/20/12 1058)  . heparin 1,000 Units/hr (03/20/12 0805)  . DISCONTD: heparin 900 Units/hr (03/20/12 0600)    Assessment:  Heparin level is subtherapeutic and decreased to 0.15, despite increasing the infusion rate today.  RN reports no interruptions or complications; RN confirms infusion rate of 12 ml/hr  Goal of Therapy:  Heparin level 0.3-0.7 units/ml Monitor platelets by anticoagulation protocol: Yes   Plan:   Bolus heparin IV 1500 units x1  Increase Heparin IV infusion to 1200 units/hr (12 ml/hr)  Heparin level in 6 hours, daily heparin level, daily CBC  Resume warfarin 5mg  today as previously ordered  Daily PT/INR  Lynann Beaver PharmD, BCPS Pager [phone] 03/20/2012 2:58 PM

## 2012-03-20 NOTE — Progress Notes (Addendum)
ANTICOAGULATION CONSULT NOTE - Follow Up Consult  Pharmacy Consult for heparin Indication: atrial fibrillation, St Jude MVR  Allergies  Allergen Reactions  . Codeine Nausea And Vomiting    Patient Measurements: Height: 5\' 1"  (154.9 cm) Weight: 129 lb 10.1 oz (58.8 kg) IBW/kg (Calculated) : 47.8   Vital Signs: Temp: 100.4 F (38 C) (06/30 0400) Temp src: Oral (06/30 0400) BP: 97/61 mmHg (06/30 0600) Pulse Rate: 110  (06/30 0600)  Labs:  Basename 03/20/12 0230 03/19/12 2030 03/19/12 1459 03/19/12 0250 03/18/12 1620 03/18/12 1000 03/18/12 0200 03/17/12 2315 03/17/12 1215  HGB 10.2* 10.1* -- -- -- -- -- -- --  HCT 30.1* 30.2* 30.5* -- -- -- -- -- --  PLT 155 166 168 -- -- -- -- -- --  APTT -- -- -- -- -- -- -- -- --  LABPROT -- -- -- -- 16.6* -- -- 16.7* 16.7*  INR -- -- -- -- 1.32 -- -- 1.33 1.33  HEPARINUNFRC 0.25* -- -- 0.39 -- 0.36 -- -- --  CREATININE -- -- -- 0.71 -- -- 0.76 -- --  CKTOTAL -- -- -- -- -- -- -- -- --  CKMB -- -- -- -- -- -- -- -- --  TROPONINI -- -- -- -- -- -- -- -- --    Estimated Creatinine Clearance: 53.9 ml/min (by C-G formula based on Cr of 0.71).   Medications: Scheduled     . LORazepam      . pantoprazole (PROTONIX) IV  40 mg Intravenous QHS  . potassium chloride  40 mEq Oral Once  Infusions:     . sodium chloride 20 mL/hr at 03/17/12 1245  . dextrose 5 % and 0.9 % NaCl with KCl 20 mEq/L 50 mL/hr at 03/19/12 1319  . heparin 900 Units/hr (03/20/12 0600)    Assessment:  71 YOF on warfarin PTA for chronic afib and mechanical MVR.  Pt is also followed for infective endocarditis.  Presents 6/27 with hepatic bleed and therapeutic INR of 2.75 (goal 2.5 - 3.5) which required reversal.  She was given Vitamin K, FFP, and FEIBA.  Embolization L hepatic artery was completed on 6/27.  Heparin bridge was started 6/27 despite recent bleeding d/t high risk of clotting.  Heparin level is subtherapeutic at 0.25  RN reports no interruptions or  complications  CBC:  Hgb low but stable, platelets remain wnl.  Goal of Therapy:  Heparin level 0.3-0.7 units/ml Monitor platelets by anticoagulation protocol: Yes   Plan:   Increase Heparin IV infusion to 1000 units/hr (10 ml/hr)  Heparin level in 6 hours  Daily heparin level  Continue to monitor H&H and platelets  Follow plan for long-term anticoagulation  Lynann Beaver PharmD, BCPS Pager [phone] 03/20/2012 7:09 AM   Addendum: Rip Harbour per Dr. Carolynne Edouard to resume coumadin. INR 1.32 on 6/28. Home dose reported as 5mg  daily except 2.5mg  mondays Plan:  Resume coumadin 5mg  today  Daily PT/INR  Loralee Pacas, PharmD, BCPS Pager: [phone] 03/20/2012 8:54 AM

## 2012-03-20 NOTE — Progress Notes (Signed)
eLink Physician-Brief Progress Note Patient Name: NEAVE LENGER DOB: 1940-11-23 MRN: [id number]  Date of Service  03/20/2012   HPI/Events of Note     eICU Interventions  Restart home lorazepam for sleep   Intervention Category Minor Interventions: Routine modifications to care plan (e.g. PRN medications for pain, fever)  Aishwarya Shiplett 03/20/2012, 9:52 PM

## 2012-03-20 NOTE — Progress Notes (Signed)
  Subjective: Feels good. No complaints  Objective: Vital signs in last 24 hours: Temp:  [98.6 F (37 C)-100.5 F (38.1 C)] 100.4 F (38 C) (06/30 0400) Pulse Rate:  [45-124] 111  (06/30 0700) Resp:  [21-30] 27  (06/30 0700) BP: (97-166)/(54-87) 127/79 mmHg (06/30 0700) SpO2:  [93 %-97 %] 96 % (06/30 0700) Weight:  [129 lb 10.1 oz (58.8 kg)] 129 lb 10.1 oz (58.8 kg) (06/30 0400) Last BM Date: 03/19/12  Intake/Output from previous day: 06/29 0701 - 06/30 0700 In: 1318 [I.V.:1308; IV Piggyback:10] Out: 2100 [Urine:2100] Intake/Output this shift:    GI: soft, minimal tenderness. good bs  Lab Results:   Basename 03/20/12 0815 03/20/12 0230  WBC 11.6* 12.5*  HGB 10.0* 10.2*  HCT 30.0* 30.1*  PLT 171 155   BMET  Basename 03/19/12 0250 03/18/12 0200  NA 133* 133*  K 3.5 3.8  CL 102 103  CO2 25 23  GLUCOSE 125* 132*  BUN 6 9  CREATININE 0.71 0.76  CALCIUM 8.9 8.5   PT/INR  Basename 03/18/12 1620 03/17/12 2315  LABPROT 16.6* 16.7*  INR 1.32 1.33   ABG No results found for this basename: PHART:2,PCO2:2,PO2:2,HCO3:2 in the last 72 hours  Studies/Results: No results found.  Anti-infectives: Anti-infectives     Start     Dose/Rate Route Frequency Ordered Stop   03/17/12 1330   ceFAZolin (ANCEF) IVPB 1 g/50 mL premix        1 g 100 mL/hr over 30 Minutes Intravenous  Once 03/17/12 1322 03/17/12 1355   03/17/12 1223   ceFAZolin (ANCEF) 1-5 GM-% IVPB     Comments: KIRKMAN, JENNIFER: cabinet override         03/17/12 1223 03/18/12 0029          Assessment/Plan: [name]/p * No surgery found * Advance diet OOB Start coumadin  LOS: 4 days    Carrie [name],Carrie [name] 03/20/2012

## 2012-03-20 NOTE — Progress Notes (Signed)
Name: Carrie [name] MRN: [id number] DOB: Oct 10, 1940    LOS: 4  Referring Provider:  CCS Reason for Referral:  Medical Mgmt ICU  PULMONARY / CRITICAL CARE MEDICINE  HPI:  [name] with PMH of HTN, Sick sinus syndrome, Anxiety, HLD, Afib, MVR on coumadin presented to [facility] ED 6/26 with a 3 week history of abdominal discomfort that she related to be reflux.  She had been seen by her primary care physician and treated with Zegerid. Only 2 weeks ago she had an episode of nausea and vomiting associated with abdominal pain that resolved after vomiting. Last p.m. she had dinner and then began having abrupt onset abdominal pain with associated nausea. Pain did not relieve and she presented to the emergency room at which time a CT demonstrated a contrast collection in the left lobe of the liver suggestive of active bleeding.  She denied abdominal injury, falls or other related incident that might cause bleeding.  In March of 2013 her hemoglobin was 12.6 and upon admission was noted to be 8.2. She was hemodynamically stable and admitted to the intensive care unit. PCCM consulted for ICU medical management.    Brief patient description:  71 year old female with a medical history of atrial fibrillation and mitral valve replacement on Coumadin who presented with acute abdominal pain.  CT of the abdomen concerning for hemorrhage within the liver.  Planned embolization by interventional radiology on 6/27.  Events Since Admission: 6/27 To IR for IR embolization of L hepatic artery 6/28 fevers. On cefazolin,. Cultured. PCT remains low   Current Status:   Holding hgb on IVheparin. CCS plans restart coumadin Fevers  + again  Though pct remains low so far.  Non toxic  Vital Signs: Temp:  [98.4 F (36.9 C)-100.5 F (38.1 C)] 98.4 F (36.9 C) (06/30 0800) Pulse Rate:  [45-124] 111  (06/30 0700) Resp:  [21-30] 27  (06/30 0700) BP: (97-166)/(54-87) 127/79 mmHg (06/30 0700) SpO2:  [93 %-97 %] 96 % (06/30  0700) Weight:  [58.8 kg (129 lb 10.1 oz)] 58.8 kg (129 lb 10.1 oz) (06/30 0400)  Physical Examination: General:  Well-developed well-nourished in no acute distress Neuro:  Awake, alert, oriented / moving all extremities HEENT:  Mm pink / moist Cardiovascular:  S1-S2 irregularly irregular rate,controlled in the 50s/ 60s Lungs:  Respirations are even and nonlabored, lungs bilaterally are clear Abdomen:  Round/soft, bowel sounds x4 hypoactive Musculoskeletal:  No obvious deformity Skin:  Warm and dry, no edema  Active Problems:  S/P mitral valve replacement, St Jude  Presence of permanent cardiac pacemaker  Chronic anticoagulation, ( INR goal 2.5-3.5 under normal circumstances for ST Jude MVR)  A-fib  Hemorrhage intraabdominal   ASSESSMENT AND PLAN  PULMONARY No results found for this basename: PHART:5,PCO2:5,PCO2ART:5,PO2ART:5,HCO3:5,O2SAT:5 in the last 168 hours Ventilator Settings:   CXR:  No new chest x-ray. Atelectasis in the bases noted on CT of the abdomen ETT:    A:   Basilar atelectasis P:   -O2 to support sats > 92% -pulmonary hygiene  CARDIOVASCULAR No results found for this basename: TROPONINI:5,LATICACIDVEN:5, O2SATVEN:5,PROBNP:5 in the last 168 hours ECG:  HR 50's / 60's, irregular, PAC's Lines:   A:  Hx of HTN Hx of Afib on coumadin Hx of MVR  P:  -tele monitor -hold home metoprolol, lanoxin, - CCS starting  coumadin 03/20/12 - cards managing   RENAL  Lab 03/19/12 0250 03/18/12 0200 03/17/12 0025  NA 133* 133* 134*  K 3.5 3.8 --  CL  102 103 103  CO2 25 23 19   BUN 6 9 17   CREATININE 0.71 0.76 0.95  CALCIUM 8.9 8.5 9.3  MG -- -- --  PHOS -- -- --   Intake/Output      06/29 0701 - 06/30 0700 06/30 0701 - 07/01 0700   P.O.     I.V. (mL/kg) 1308 (22.2)    Blood     IV Piggyback 10    Total Intake(mL/kg) 1318 (22.4)    Urine (mL/kg/hr) 2100 (1.5)    Total Output 2100    Net -782         Stool Occurrence 1 x     Foley:    A:   Mild  Hyponatremia and hypokalemia   P:   - no repletion 03/20/12 -trend BMP -monitor renal function with relative volume loss  GASTROINTESTINAL  Lab 03/17/12 0025  AST 113*  ALT 57*  ALKPHOS 116  BILITOT 0.5  PROT 7.1  ALBUMIN 3.1*   6/26 CT ABD/Pelvis:  Contrast collection in the left lobe of the liver suggesting focus of active extravasation likely due to pseudoaneurysm versus arterial portal shunting versus hemorrhagic tumor. Cystic changes in the lateral segment of the left lobe may represent mass lesion, bile duct dilatation, or hematomas. Large amount of free hemorrhage throughout the peritoneum.  A:   Hepatic / Peritoneal Hemorrhage -no clear injury.  ?spontaneous bleed.  INR within therapeutic range.   P:   -To IR for embolization -Rec's per CCS / IR  HEMATOLOGIC  Lab 03/20/12 0815 03/20/12 0230 03/19/12 2030 03/19/12 1459 03/19/12 0805 03/18/12 1620 03/17/12 2315 03/17/12 1215 03/17/12 0030  HGB 10.0* 10.2* 10.1* 10.4* 10.2* -- -- -- --  HCT 30.0* 30.1* 30.2* 30.5* 30.4* -- -- -- --  PLT 171 155 166 168 163 -- -- -- --  INR -- -- -- -- -- 1.32 1.33 1.33 2.75*  APTT -- -- -- -- -- -- -- -- --   A:   Acute blood loss anemia - s/p IR embolization and FFP and FEIB reversal on 03/16/2012 Coagulopathy -on coumadin as above   -on 6/30 - holding hgbwhile on  IV heparin     P:  --CCS restarting  Coumadin 6/30 -heparin gtt per pharmacy -follow h/h  - - PRBC for hgb </= 6.9gm%    - exceptions are   -  if ACS susepcted/confirmed then transfuse for hgb </= 8.0gm%,  or    -  If septic shock first 24h and scvo2 < 70% then transfuse for hgb </= 9.0gm%   - active bleeding with hemodynamic instability, then transfuse regardless of hemoglobin value   At at all times try to transfuse 1 unit prbc as possible with exception of active hemorrhage     INFECTIOUS  Lab 03/20/12 0815 03/20/12 0230 03/19/12 2030 03/19/12 1459 03/19/12 0821 03/19/12 0805 03/18/12 2000  WBC 11.6* 12.5*  12.9* 14.0* -- 14.7* --  PROCALCITON -- -- -- -- 0.14 -- 0.17   Cultures: 6/28 BCx2>>>  Antibiotics: 6/27 Cefazolin>>>  A:   Fever - overnight 6/27.  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  Likely related to blood in abdomen.  However, concern for mycotic seeding must be considered given hx of valvular vegetation.    -on 6/30: still febrile despite low PCT and ancef. Cultures negative so far  P:   - repeat blood culture 6/30  - repeat PCT 6/30  - if fever present 03/21/12, consider call ID  [email or web address]  Lab 03/18/12  1956 03/16/12 2326  GLUCAP 120* 184*   A:   No acute issues   P:   -monitor glucose on BMP  NEUROLOGIC  A:   No acute issues.   P:   -supportive care  BEST PRACTICE / DISPOSITION Level of Care:  ICU -> change to sDU 03/20/12 Primary Service:  CCS Consultants:  PCCM Code Status:  Full Diet:  NPO DVT Px:  SCD's  GI Px:  protonix Skin Integrity:  intact Social / Family:  None at bedside.  Patient updated.       Dr. Kalman Shan, M.D., [facility].C.P Pulmonary and Critical Care Medicine Staff Physician [facility] [place] Pulmonary and Critical Care Pager: [phone], If no answer or between  15:00h - 7:00h: call [phone]  03/20/2012 9:14 AM

## 2012-03-21 DIAGNOSIS — I609 Nontraumatic subarachnoid hemorrhage, unspecified: Secondary | ICD-10-CM | POA: Diagnosis not present

## 2012-03-21 DIAGNOSIS — IMO0001 Reserved for inherently not codable concepts without codable children: Secondary | ICD-10-CM

## 2012-03-21 LAB — PROTIME-INR: Prothrombin Time: 15.7 seconds — ABNORMAL HIGH (ref 11.6–15.2)

## 2012-03-21 LAB — CBC
Hemoglobin: 10.2 g/dL — ABNORMAL LOW (ref 12.0–15.0)
MCH: 30 pg (ref 26.0–34.0)
MCV: 90.9 fL (ref 78.0–100.0)
RBC: 3.4 MIL/uL — ABNORMAL LOW (ref 3.87–5.11)

## 2012-03-21 MED ORDER — COUMADIN BOOK
Freq: Once | Status: AC
  Administered 2012-03-21: 15:00:00
  Filled 2012-03-21: qty 1

## 2012-03-21 MED ORDER — METOPROLOL TARTRATE 25 MG PO TABS
25.0000 mg | ORAL_TABLET | Freq: Two times a day (BID) | ORAL | Status: DC
  Administered 2012-03-21 – 2012-03-29 (×17): 25 mg via ORAL
  Filled 2012-03-21 (×19): qty 1

## 2012-03-21 MED ORDER — DILTIAZEM HCL ER COATED BEADS 120 MG PO CP24
120.0000 mg | ORAL_CAPSULE | Freq: Every day | ORAL | Status: DC
  Administered 2012-03-21 – 2012-03-29 (×9): 120 mg via ORAL
  Filled 2012-03-21 (×9): qty 1

## 2012-03-21 MED ORDER — WARFARIN SODIUM 5 MG PO TABS
5.0000 mg | ORAL_TABLET | Freq: Once | ORAL | Status: AC
  Administered 2012-03-21: 5 mg via ORAL
  Filled 2012-03-21: qty 1

## 2012-03-21 NOTE — Progress Notes (Signed)
ANTICOAGULATION CONSULT NOTE - Follow Up Consult  Pharmacy Consult for heparin Indication: afib, st. Jude MVR  Allergies  Allergen Reactions  . Codeine Nausea And Vomiting    Patient Measurements: Height: 5\' 1"  (154.9 cm) Weight: 129 lb 10.1 oz (58.8 kg) IBW/kg (Calculated) : 47.8  Heparin Dosing Weight:   Vital Signs: Temp: 100.3 F (37.9 C) (07/01 0400) Temp src: Oral (07/01 0001) BP: 138/75 mmHg (07/01 0400) Pulse Rate: 91  (07/01 0400)  Labs:  Basename 03/20/12 2238 03/20/12 2137 03/20/12 1356 03/20/12 0815 03/20/12 0230 03/19/12 2030 03/19/12 0250 03/18/12 1620  HGB -- -- -- 10.0* 10.2* -- -- --  HCT -- -- -- 30.0* 30.1* 30.2* -- --  PLT -- -- -- 171 155 166 -- --  APTT -- -- -- -- -- -- -- --  LABPROT -- -- -- -- -- -- -- 16.6*  INR -- -- -- -- -- -- -- 1.32  HEPARINUNFRC 0.40 <0.10* 0.15* -- -- -- -- --  CREATININE -- -- -- -- -- -- 0.71 --  CKTOTAL -- -- -- -- -- -- -- --  CKMB -- -- -- -- -- -- -- --  TROPONINI -- -- -- -- -- -- -- --    Estimated Creatinine Clearance: 53.9 ml/min (by C-G formula based on Cr of 0.71).   Medications:  Infusions:    . sodium chloride 20 mL/hr at 03/17/12 1245  . dextrose 5 % and 0.9 % NaCl with KCl 20 mEq/L 50 mL/hr at 03/21/12 0315  . heparin 1,200 Units/hr (03/21/12 0315)  . DISCONTD: heparin 900 Units/hr (03/20/12 0600)  . DISCONTD: heparin 1,000 Units/hr (03/20/12 0805)    Assessment: Patient with heparin level at goal.  No issues per RN.  1st level drawn (2137) was incorrect per RN.  Asked RN to redraw.    Goal of Therapy:  Heparin level 0.3-0.7 units/ml Monitor platelets by anticoagulation protocol: Yes   Plan:  Continue heparin at current rate, follow up with am level  Darlina Guys, Jacquenette Shone Crowford 03/21/2012,4:53 AM

## 2012-03-21 NOTE — Progress Notes (Addendum)
  Subjective: Having some intermittent RUQ pains.  Objective: Vital signs in last 24 hours: Temp:  [99.1 F (37.3 C)-100.3 F (37.9 C)] 99.3 F (37.4 C) (07/01 0800) Pulse Rate:  [57-124] 70  (07/01 0600) Resp:  [19-29] 25  (07/01 0600) BP: (123-162)/(62-97) 127/72 mmHg (07/01 0600) SpO2:  [93 %-100 %] 95 % (07/01 0600) Last BM Date: 03/20/12  Intake/Output from previous day: 06/30 0701 - 07/01 0700 In: 2068.9 [P.O.:660; I.V.:1408.9] Out: 2600 [Urine:2600] Intake/Output this shift:    PE: Abd-soft, mild RUQ tenderness  Lab Results:   Basename 03/21/12 0420 03/20/12 0815  WBC 10.3 11.6*  HGB 10.2* 10.0*  HCT 30.9* 30.0*  PLT 201 171   BMET  Basename 03/19/12 0250  NA 133*  K 3.5  CL 102  CO2 25  GLUCOSE 125*  BUN 6  CREATININE 0.71  CALCIUM 8.9   PT/INR  Basename 03/21/12 0420 03/18/12 1620  LABPROT 15.7* 16.6*  INR 1.22 1.32   Comprehensive Metabolic Panel:    Component Value Date/Time   NA 133* 03/19/2012 0250   NA 138 03/14/2010 1318   K 3.5 03/19/2012 0250   K 4.1 03/14/2010 1318   CL 102 03/19/2012 0250   CL 100 03/14/2010 1318   CO2 25 03/19/2012 0250   CO2 27 03/14/2010 1318   BUN 6 03/19/2012 0250   BUN 14 03/14/2010 1318   CREATININE 0.71 03/19/2012 0250   CREATININE 0.63 10/28/2011 1453   GLUCOSE 125* 03/19/2012 0250   GLUCOSE 101 03/14/2010 1318   CALCIUM 8.9 03/19/2012 0250   CALCIUM 9.9 03/14/2010 1318   AST 113* 03/17/2012 0025   AST 46* 03/14/2010 1318   ALT 57* 03/17/2012 0025   ALKPHOS 116 03/17/2012 0025   ALKPHOS 125* 03/14/2010 1318   BILITOT 0.5 03/17/2012 0025   BILITOT 0.70 03/14/2010 1318   PROT 7.1 03/17/2012 0025   PROT 8.8* 03/14/2010 1318   ALBUMIN 3.1* 03/17/2012 0025     Studies/Results: No results found.  Anti-infectives: Anti-infectives     Start     Dose/Rate Route Frequency Ordered Stop   03/17/12 1330   ceFAZolin (ANCEF) IVPB 1 g/50 mL premix        1 g 100 mL/hr over 30 Minutes Intravenous  Once 03/17/12 1322  03/17/12 1355   03/17/12 1223   ceFAZolin (ANCEF) 1-5 GM-% IVPB     Comments: KIRKMAN, JENNIFER: cabinet override         03/17/12 1223 03/18/12 0029          Assessment Active Problems: Spontaneous liver bleed s/p embolization-hgb stable.   S/P mitral valve replacement, St Jude  Chronic anticoagulation, ( INR goal 2.5-3.5 under normal circumstances for ST Jude MVR)-coumadin restarted; on heparin  A-fib  Fevers-w/u in progress     LOS: 5 days   Plan: Continue current diet.  If she remains stable the next 24 hours will advance diet and d/c foley.   Passion Lavin J 03/21/2012

## 2012-03-21 NOTE — Progress Notes (Signed)
Pt. Seen and examined. Agree with the NP/PA-C note as written.  Being started back on coumadin. HR still hovering in the low 100's-110's with a-fib. Adding diltiazem 120 mg daily today. On BID lopressor. Blood cultures no growth to date. HCT has been stable.  Chrystie Nose, MD, [facility] Attending Cardiologist [facility]

## 2012-03-21 NOTE — Progress Notes (Signed)
Name: Carrie [name] MRN: [id number] DOB: Mar 15, 1941    LOS: 5  Referring Provider:  CCS Reason for Referral:  Medical Mgmt ICU  PULMONARY / CRITICAL CARE MEDICINE  HPI:  [name] with PMH of HTN, Sick sinus syndrome, Anxiety, HLD, Afib, MVR on coumadin presented to [facility] ED 6/26 with a 3 week history of abdominal discomfort that she related to be reflux.  She had been seen by her primary care physician and treated with Zegerid. Only 2 weeks ago she had an episode of nausea and vomiting associated with abdominal pain that resolved after vomiting. Last p.m. she had dinner and then began having abrupt onset abdominal pain with associated nausea. Pain did not relieve and she presented to the emergency room at which time a CT demonstrated a contrast collection in the left lobe of the liver suggestive of active bleeding.  She denied abdominal injury, falls or other related incident that might cause bleeding.  In March of 2013 her hemoglobin was 12.6 and upon admission was noted to be 8.2. She was hemodynamically stable and admitted to the intensive care unit. PCCM consulted for ICU medical management.    Brief patient description:  71 year old female with a medical history of atrial fibrillation and mitral valve replacement on Coumadin who presented with acute abdominal pain.  CT of the abdomen concerning for hemorrhage within the liver.  Planned embolization by interventional radiology on 6/27.  Events Since Admission: 6/27 To IR for IR embolization of L hepatic artery 6/28 fevers. On cefazolin,. Cultured. PCT remains low 7/1 wide awake  NAD  Current Status:   nadc  Vital Signs: Temp:  [99.1 F (37.3 C)-100.3 F (37.9 C)] 99.3 F (37.4 C) (07/01 0800) Pulse Rate:  [57-118] 70  (07/01 0600) Resp:  [19-29] 25  (07/01 0600) BP: (123-162)/(62-84) 127/72 mmHg (07/01 0600) SpO2:  [93 %-100 %] 95 % (07/01 0600)  Physical Examination: General:  Well-developed well-nourished in no acute  distress Neuro:  Awake, alert, oriented / moving all extremities HEENT:  Mm pink / moist Cardiovascular:  S1-S2 irregularly irregular rate,controlled in the 50s/ 60s Lungs:  Respirations are even and nonlabored, lungs bilaterally are clear. No O2 Abdomen:  Round/soft, bowel sounds x4 hypoactive Musculoskeletal:  No obvious deformity Skin:  Warm and dry, no edema  Active Problems:  S/P mitral valve replacement, St Jude  Presence of permanent cardiac pacemaker  Chronic anticoagulation, ( INR goal 2.5-3.5 under normal circumstances for ST Jude MVR)  A-fib  Hemorrhage intraabdominal   ASSESSMENT AND PLAN  PULMONARY No results found for this basename: PHART:5,PCO2:5,PCO2ART:5,PO2ART:5,HCO3:5,O2SAT:5 in the last 168 hours Ventilator Settings:   CXR:  No new chest x-ray. No results found.  ETT:    A:   Basilar atelectasis P:   -O2 to support sats > 92% -pulmonary hygiene  CARDIOVASCULAR  Lab 03/20/12 0850  TROPONINI --  LATICACIDVEN 0.8  PROBNP --   ECG:  HR 50's / 60's, irregular, PAC's Lines:   A:  Hx of HTN Hx of Afib on coumadin Hx of MVR  P:  -tele monitor -hold home metoprolol, lanoxin, - CCS starting  coumadin 03/20/12 - cards managing   RENAL  Lab 03/19/12 0250 03/18/12 0200 03/17/12 0025  NA 133* 133* 134*  K 3.5 3.8 --  CL 102 103 103  CO2 25 23 19   BUN 6 9 17   CREATININE 0.71 0.76 0.95  CALCIUM 8.9 8.5 9.3  MG -- -- --  PHOS -- -- --  Intake/Output      06/30 0701 - 07/01 0700 07/01 0701 - 07/02 0700   P.O. 660    I.V. (mL/kg) 1408.9 (24)    IV Piggyback     Total Intake(mL/kg) 2068.9 (35.2)    Urine (mL/kg/hr) 2600 (1.8)    Total Output 2600    Net -531.1         Stool Occurrence 1 x     Foley:    A:   Mild Hyponatremia and hypokalemia   P:   - no repletion needed -trend BMP -monitor renal function with relative volume loss  GASTROINTESTINAL  Lab 03/17/12 0025  AST 113*  ALT 57*  ALKPHOS 116  BILITOT 0.5  PROT 7.1   ALBUMIN 3.1*   6/26 CT ABD/Pelvis:  Contrast collection in the left lobe of the liver suggesting focus of active extravasation likely due to pseudoaneurysm versus arterial portal shunting versus hemorrhagic tumor. Cystic changes in the lateral segment of the left lobe may represent mass lesion, bile duct dilatation, or hematomas. Large amount of free hemorrhage throughout the peritoneum.  A:   Hepatic / Peritoneal Hemorrhage -no clear injury.  ?spontaneous bleed.  INR within therapeutic range.   P:   -To IR for embolization -Rec's per CCS / IR  HEMATOLOGIC  Lab 03/21/12 0420 03/20/12 0815 03/20/12 0230 03/19/12 2030 03/19/12 1459 03/18/12 1620 03/17/12 2315 03/17/12 1215 03/17/12 0030  HGB 10.2* 10.0* 10.2* 10.1* 10.4* -- -- -- --  HCT 30.9* 30.0* 30.1* 30.2* 30.5* -- -- -- --  PLT 201 171 155 166 168 -- -- -- --  INR 1.22 -- -- -- -- 1.32 1.33 1.33 2.75*  APTT -- -- -- -- -- -- -- -- --   A:   Acute blood loss anemia - s/p IR embolization and FFP and FEIB reversal on 03/16/2012 Coagulopathy -on coumadin as above   -on 6/30 - holding hgb while on  IV heparin     P:  --CCS restarting  Coumadin 6/30 -heparin gtt per pharmacy -follow h/h     INFECTIOUS  Lab 03/21/12 0420 03/20/12 0850 03/20/12 0815 03/20/12 0230 03/19/12 2030 03/19/12 1459 03/19/12 0821 03/18/12 2000  WBC 10.3 -- 11.6* 12.5* 12.9* 14.0* -- --  PROCALCITON -- <0.10 -- -- -- -- 0.14 0.17   Cultures: 6/28 BCx2>>>  Antibiotics: 6/27 Cefazolin>>>  A:   Fever - overnight 6/27.  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  Likely related to blood in abdomen.  However, concern for mycotic seeding must be considered given hx of valvular vegetation.    -on 6/30: still febrile despite low PCT and ancef. Cultures negative so far  P:   - repeat blood culture 6/30    ENDOCRINE  Lab 03/20/12 1127 03/18/12 1956 03/16/12 2326  GLUCAP 136* 120* 184*   A:   No acute issues   P:   -monitor glucose on  BMP  NEUROLOGIC  A:   No acute issues.   P:   -supportive care  Global: Consider tx to floor 7/1  BEST PRACTICE / DISPOSITION Level of Care:  ICU -> change to sDU 03/20/12 Primary Service:  CCS Consultants:  PCCM Code Status:  Full Diet:  NPO DVT Px:  SCD's  GI Px:  protonix Skin Integrity:  intact Social / Family:  None at bedside.  Patient updated.       Brett Canales Minor ACNP [facility] PCCM Pager [phone] till 3 pm If no answer page [phone] 03/21/2012, 10:05 AM  Reviewed above, examined pt, and agree with assessment/plan.  No fever in last 24 hrs, and cultures negative to date.  No other active critical care issues.  PCCM will sign off.  Recommend consulting Hospitalist team if further medical assistance needed.    Coralyn Helling, MD [facility] Pulmonary/Critical Care 03/21/2012, 11:42 AM Pager:  [phone] After 3pm call: [phone]

## 2012-03-21 NOTE — Progress Notes (Signed)
Subjective:  Up in chair, starting liquid diet  Objective:  Vital Signs in the last 24 hours: Temp:  [98.4 F (36.9 C)-100.3 F (37.9 C)] 98.4 F (36.9 C) (07/01 1200) Pulse Rate:  [56-118] 56  (07/01 1139) Resp:  [22-32] 32  (07/01 1139) BP: (123-155)/(65-84) 155/78 mmHg (07/01 1139) SpO2:  [93 %-100 %] 100 % (07/01 1139)  Intake/Output from previous day:  Intake/Output Summary (Last 24 hours) at 03/21/12 1341 Last data filed at 03/21/12 1200  Gross per 24 hour  Intake   2082 ml  Output   2750 ml  Net   -668 ml    Physical Exam: General appearance: alert, cooperative and no distress Lungs: clear to auscultation bilaterally Heart: irregularly irregular rhythm and loud valve sounds   Rate: 110  Rhythm: atrial fibrillation  Lab Results:  Basename 03/21/12 0420 03/20/12 0815  WBC 10.3 11.6*  HGB 10.2* 10.0*  PLT 201 171    Basename 03/19/12 0250  NA 133*  K 3.5  CL 102  CO2 25  GLUCOSE 125*  BUN 6  CREATININE 0.71   No results found for this basename: TROPONINI:2,CK,MB:2 in the last 72 hours Hepatic Function Panel No results found for this basename: PROT,ALBUMIN,AST,ALT,ALKPHOS,BILITOT,BILIDIR,IBILI in the last 72 hours No results found for this basename: CHOL in the last 72 hours  Basename 03/21/12 0420  INR 1.22    Imaging: Imaging results have been reviewed  Cardiac Studies:  Assessment/Plan:   Principal Problem:  *Hemorrhage, hepatic, Rx'd with Coumadin reversal and embolisation 03/17/12 Active Problems:  S/P mitral valve replacement, St Jude  Chronic anticoagulation, ( INR goal 2.5-3.5 under normal circumstances for ST Jude MVR)  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 Will resume some of her home meds.   Corine Shelter  PA-C 03/21/2012, 1:41 PM

## 2012-03-21 NOTE — Progress Notes (Addendum)
ANTICOAGULATION CONSULT NOTE - Follow Up Consult  Pharmacy Consult for heparin/coumadin Indication: atrial fibrillation, St Jude MVR  Labs:  Basename 03/21/12 0420 03/20/12 2238 03/20/12 2137 03/20/12 0815 03/20/12 0230 03/19/12 0250 03/18/12 1620  HGB 10.2* -- -- 10.0* -- -- --  HCT 30.9* -- -- 30.0* 30.1* -- --  PLT 201 -- -- 171 155 -- --  APTT -- -- -- -- -- -- --  LABPROT 15.7* -- -- -- -- -- 16.6*  INR 1.22 -- -- -- -- -- 1.32  HEPARINUNFRC 0.44 0.40 <0.10* -- -- -- --  CREATININE -- -- -- -- -- 0.71 --  CKTOTAL -- -- -- -- -- -- --  CKMB -- -- -- -- -- -- --  TROPONINI -- -- -- -- -- -- --    Medications: Infusions:     . sodium chloride 20 mL/hr at 03/17/12 1245  . dextrose 5 % and 0.9 % NaCl with KCl 20 mEq/L 50 mL/hr at 03/21/12 0315  . heparin 1,200 Units/hr (03/21/12 0315)  . DISCONTD: heparin 1,000 Units/hr (03/20/12 0805)    Assessment: 71 YOF on warfarin PTA for chronic afib and mechanical MVR. Pt is also followed for infective endocarditis. Presents 6/27 with hepatic bleed and therapeutic INR of 2.75 (goal 2.5 - 3.5) which required reversal. She was given Vitamin K, FFP, and FEIBA. Embolization L hepatic artery was completed on 6/27. Heparin bridge was started 6/27 despite recent bleeding d/t high risk of clotting. Coumadin resumed 6/30   Heparin level is therapeutic this am  INR responded after resuming coumadin 5mg  yesterday no bleeding/complications reported, CBC stable  Goal of Therapy:  Heparin level 0.3-0.7 units/ml Monitor platelets by anticoagulation protocol: Yes INR 2.5-3.0 per Cardilogy 6/29   Plan:   Continue heparin 1200 units/hr  Repeat warfarin 5mg  today   Daily heparin level, CBC, PT/INR  Patient had questions regarding vitamin K interaction with coumadin; provided written and verbal information  Loralee Pacas, PharmD, BCPS Pager: [phone] 03/21/2012 7:42 AM

## 2012-03-22 ENCOUNTER — Inpatient Hospital Stay (HOSPITAL_COMMUNITY): Payer: Medicare Other

## 2012-03-22 LAB — BASIC METABOLIC PANEL
CO2: 21 mEq/L (ref 19–32)
Calcium: 9.5 mg/dL (ref 8.4–10.5)
GFR calc Af Amer: 48 mL/min — ABNORMAL LOW (ref >90)
Sodium: 131 mEq/L — ABNORMAL LOW (ref 135–145)

## 2012-03-22 LAB — PROTIME-INR
INR: 1.29 (ref 0.00–1.49)
Prothrombin Time: 16.3 seconds — ABNORMAL HIGH (ref 11.6–15.2)

## 2012-03-22 LAB — CBC
HCT: 31.9 % — ABNORMAL LOW (ref 36.0–46.0)
Hemoglobin: 10.5 g/dL — ABNORMAL LOW (ref 12.0–15.0)
RDW: 17.1 % — ABNORMAL HIGH (ref 11.5–15.5)
WBC: 8.5 10*3/uL (ref 4.0–10.5)

## 2012-03-22 LAB — HEPARIN LEVEL (UNFRACTIONATED): Heparin Unfractionated: 0.48 IU/mL (ref 0.30–0.70)

## 2012-03-22 LAB — MAGNESIUM: Magnesium: 1.8 mg/dL (ref 1.5–2.5)

## 2012-03-22 LAB — PHOSPHORUS: Phosphorus: 3.1 mg/dL (ref 2.3–4.6)

## 2012-03-22 MED ORDER — WARFARIN SODIUM 5 MG PO TABS
5.0000 mg | ORAL_TABLET | Freq: Once | ORAL | Status: AC
  Administered 2012-03-22: 5 mg via ORAL
  Filled 2012-03-22: qty 1

## 2012-03-22 NOTE — Progress Notes (Signed)
ANTICOAGULATION CONSULT NOTE - Follow Up Consult  Pharmacy Consult for heparin/coumadin Indication: atrial fibrillation, St Jude MVR  Labs:  Basename 03/22/12 0310 03/21/12 0420 03/20/12 2238 03/20/12 0815  HGB 10.5* 10.2* -- --  HCT 31.9* 30.9* -- 30.0*  PLT 179 201 -- 171  APTT -- -- -- --  LABPROT 16.3* 15.7* -- --  INR 1.29 1.22 -- --  HEPARINUNFRC 0.48 0.44 0.40 --  CREATININE 1.28* -- -- --  CKTOTAL -- -- -- --  CKMB -- -- -- --  TROPONINI -- -- -- --    Medications: Infusions:     . sodium chloride 20 mL/hr at 03/17/12 1245  . dextrose 5 % and 0.9 % NaCl with KCl 20 mEq/L 50 mL/hr at 03/22/12 0100  . heparin 1,200 Units/hr (03/21/12 0315)    Assessment: 71 YOF on warfarin PTA for chronic afib and mechanical MVR. Pt is also followed for infective endocarditis. Presents 6/27 with hepatic bleed and therapeutic INR of 2.75 (goal 2.5 - 3.5) which required reversal. She was given Vitamin K, FFP, and FEIBA. Embolization L hepatic artery was completed on 6/27. Heparin bridge was started 6/27 despite recent bleeding d/t high risk of clotting. Coumadin resumed 6/30   Heparin level is therapeutic this am.  INR responding slightly after coumadin 5mg  x2 days. INR will demonstrate some initial resistance s/p vitamin k and ffp. No bleeding/complications reported, CBC stable.  Goal of Therapy:  Heparin level 0.3-0.7 units/ml Monitor platelets by anticoagulation protocol: Yes INR 2.5-3.0 per Cardilogy 6/29    Plan:   Continue heparin 1200 units/hr  Repeat warfarin 5mg  today - if only minimal response tomorrow, will increase dose if no bleeding  Daily heparin level, CBC, PT/INR  Patient had questions regarding vitamin K interaction with coumadin; provided written and verbal information on 7/1  Loralee Pacas, PharmD, BCPS Pager: [phone] 03/22/2012 7:13 AM

## 2012-03-22 NOTE — Progress Notes (Signed)
Utilization review completed.  

## 2012-03-22 NOTE — Progress Notes (Signed)
[facility]  Subjective: Feeling better.  Gets DOE.  No orthopnea, CP.  Objective: Vital signs in last 24 hours: Temp:  [98.3 F (36.8 C)-99.5 F (37.5 C)] 98.4 F (36.9 C) (07/02 1200) Pulse Rate:  [67-95] 70  (07/02 0905) Resp:  [18-28] 18  (07/02 0905) BP: (119-147)/(59-74) 142/60 mmHg (07/02 0905) SpO2:  [93 %-97 %] 95 % (07/02 0905) Weight:  [58.8 kg (129 lb 10.1 oz)] 58.8 kg (129 lb 10.1 oz) (07/02 0000) Last BM Date: 03/21/12  Intake/Output from previous day: 07/01 0701 - 07/02 0700 In: 2038 [P.O.:540; I.V.:1488; IV Piggyback:10] Out: 1475 [Urine:1475] Intake/Output this shift: Total I/O In: 510 [P.O.:200; I.V.:310] Out: 851 [Urine:850; Stool:1]  Medications Current Facility-Administered Medications  Medication Dose Route Frequency Provider Last Rate Last Dose  . 0.9 %  sodium chloride infusion   Intravenous Continuous Art A Hoss, MD 20 mL/hr at 03/17/12 1245    . dextrose 5 % and 0.9 % NaCl with KCl 20 mEq/L infusion   Intravenous Continuous Jeanella Craze, NP 50 mL/hr at 03/22/12 0100    . diltiazem (CARDIZEM CD) 24 hr capsule 120 mg  120 mg Oral Daily Eda Paschal Camilla, [place]   120 mg at 03/22/12 1038  . heparin ADULT infusion 100 units/mL (25000 units/250 mL)  1,200 Units/hr Intravenous Continuous Winfield Rast, PHARMD 12 mL/hr at 03/21/12 0315 1,200 Units/hr at 03/21/12 0315  . LORazepam (ATIVAN) tablet 0.5 mg  0.5 mg Oral QHS Lupita Leash, MD   0.5 mg at 03/21/12 2215  . metoprolol tartrate (LOPRESSOR) tablet 25 mg  25 mg Oral BID Eda Paschal Ramona, PA   25 mg at 03/22/12 1037  . morphine 2 MG/ML injection 2 mg  2 mg Intravenous Q1H PRN Caleen Essex III, MD      . ondansetron [facility]) injection 4 mg  4 mg Intravenous Q6H PRN Caleen Essex III, MD      . pantoprazole (PROTONIX) injection 40 mg  40 mg Intravenous QHS Caleen Essex III, MD   40 mg at 03/21/12 2215  . warfarin (COUMADIN) tablet 5 mg  5 mg Oral ONCE-1800 Rollene Fare,  PHARMD   5 mg at 03/21/12 1813  . warfarin (COUMADIN) tablet 5 mg  5 mg Oral ONCE-1800 Rollene Fare, PHARMD      . Warfarin - Pharmacist Dosing Inpatient   Does not apply q1800 Rollene Fare, PHARMD        PE: General appearance: alert, cooperative and no distress Lungs: clear to auscultation bilaterally Heart: irregularly irregular rhythm Abdomen: +BS, 8/10 epigastric tenderness. Extremities: Trace LEE Pulses: 2+ and symmetric Skin: Warm and dry Neurologic: Grossly normal  Lab Results:   Basename 03/22/12 0310 03/21/12 0420 03/20/12 0815  WBC 8.5 10.3 11.6*  HGB 10.5* 10.2* 10.0*  HCT 31.9* 30.9* 30.0*  PLT 179 201 171   BMET  Basename 03/22/12 0310  NA 131*  K 4.1  CL 103  CO2 21  GLUCOSE 109*  BUN 7  CREATININE 1.28*  CALCIUM 9.5   PT/INR  Basename 03/22/12 0310 03/21/12 0420  LABPROT 16.3* 15.7*  INR 1.29 1.22     Assessment/Plan   Principal Problem:  *Hemorrhage, hepatic, Rx'd with Coumadin reversal and embolisation 03/17/12 Active Problems:  S/P mitral valve replacement, St Jude  Presence of permanent cardiac pacemaker  Chronic anticoagulation, ( INR goal 2.5-3.5 under normal circumstances for ST Jude MVR)  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  BP ok.  Coumadin started yesterday.  INR subtherapeutic.  Getting IV heparin.  Abdominal pain-epigastric region. ? Residual from hepatic hemorrhage.    LOS: 6 days    HAGER, BRYAN 03/22/2012 3:07 PM  I have seen & examined the patient this PM after Mr. Leron Croak.  I agree with his findings, examination, and recommendations as written above.    Afib rate has improved with Diltiazem - with PPM, the BB &/or CCB can be pushed as BP tolerates. Heparin - warfaring bridge for 2 Valve Mech Prostheses. H/o  Mech V endocarditis --> blood Cx remain NGTD.   Marykay Lex, M.D., M.S. [facility] [address] [place], [place]  [address]  [phone] Pager # [phone]  03/22/2012 5:23 PM

## 2012-03-22 NOTE — Progress Notes (Signed)
Subjective: Feels much better today.  Objective: Vital signs in last 24 hours: Temp:  [98.3 F (36.8 C)-99.5 F (37.5 C)] 98.3 F (36.8 C) (07/02 0800) Pulse Rate:  [56-95] 70  (07/02 0905) Resp:  [18-32] 18  (07/02 0905) BP: (119-155)/(59-78) 142/60 mmHg (07/02 0905) SpO2:  [93 %-100 %] 95 % (07/02 0905) Weight:  [129 lb 10.1 oz (58.8 kg)] 129 lb 10.1 oz (58.8 kg) (07/02 0000) Last BM Date: 03/21/12  Intake/Output from previous day: 07/01 0701 - 07/02 0700 In: 2038 [P.O.:540; I.V.:1488; IV Piggyback:10] Out: 1475 [Urine:1475] Intake/Output this shift: Total I/O In: 62 [I.V.:62] Out: 550 [Urine:550]  PE: Abd-soft, nontender this AM  Lab Results:   Basename 03/22/12 0310 03/21/12 0420  WBC 8.5 10.3  HGB 10.5* 10.2*  HCT 31.9* 30.9*  PLT 179 201   BMET  Basename 03/22/12 0310  NA 131*  K 4.1  CL 103  CO2 21  GLUCOSE 109*  BUN 7  CREATININE 1.28*  CALCIUM 9.5   PT/INR  Basename 03/22/12 0310 03/21/12 0420  LABPROT 16.3* 15.7*  INR 1.29 1.22   Comprehensive Metabolic Panel:    Component Value Date/Time   NA 131* 03/22/2012 0310   NA 138 03/14/2010 1318   K 4.1 03/22/2012 0310   K 4.1 03/14/2010 1318   CL 103 03/22/2012 0310   CL 100 03/14/2010 1318   CO2 21 03/22/2012 0310   CO2 27 03/14/2010 1318   BUN 7 03/22/2012 0310   BUN 14 03/14/2010 1318   CREATININE 1.28* 03/22/2012 0310   CREATININE 0.63 10/28/2011 1453   GLUCOSE 109* 03/22/2012 0310   GLUCOSE 101 03/14/2010 1318   CALCIUM 9.5 03/22/2012 0310   CALCIUM 9.9 03/14/2010 1318   AST 113* 03/17/2012 0025   AST 46* 03/14/2010 1318   ALT 57* 03/17/2012 0025   ALKPHOS 116 03/17/2012 0025   ALKPHOS 125* 03/14/2010 1318   BILITOT 0.5 03/17/2012 0025   BILITOT 0.70 03/14/2010 1318   PROT 7.1 03/17/2012 0025   PROT 8.8* 03/14/2010 1318   ALBUMIN 3.1* 03/17/2012 0025     Studies/Results: Dg Chest Port 1 View  03/22/2012  *RADIOLOGY REPORT*  Clinical Data: Atelectasis.  PORTABLE CHEST - 1 VIEW  Comparison: 09/29/2011.   Findings: Cardiomegaly is present.  Right upper extremity PICC is present with the tip in the mid SVC.  Subsegmental atelectasis is present in the right lung.  Discoid atelectasis in the left lung along the left heart border.  Mild basilar atelectasis.  No airspace consolidation.  Postoperative changes of median sternotomy.  Mitral valve replacement.  Low volume chest.  IMPRESSION:  1.  New right upper extremity PICC with the tip in the mid SVC. 2.  Low volume chest with bilateral mid to lower lung atelectasis and left greater than right basilar atelectasis.  Original Report Authenticated By: Andreas Newport, M.D.    Anti-infectives: Anti-infectives     Start     Dose/Rate Route Frequency Ordered Stop   03/17/12 1330   ceFAZolin (ANCEF) IVPB 1 g/50 mL premix        1 g 100 mL/hr over 30 Minutes Intravenous  Once 03/17/12 1322 03/17/12 1355   03/17/12 1223   ceFAZolin (ANCEF) 1-5 GM-% IVPB     Comments: KIRKMAN, JENNIFER: cabinet override         03/17/12 1223 03/18/12 0029          Assessment Active Problems: Spontaneous liver bleed s/p embolization-hgb remains stable.   S/P mitral valve replacement,  St Jude  Chronic anticoagulation, ( INR goal 2.5-3.5 under normal circumstances for ST Jude MVR)-coumadin restarted; on heparin  A-fib  Fevers-w/u in progress; cultures negative thus far     LOS: 6 days   Plan: Transfer to telemetry.  Advance diet.  Adolph Pollack 03/22/2012

## 2012-03-23 LAB — CBC
MCH: 29.9 pg (ref 26.0–34.0)
MCHC: 32.9 g/dL (ref 30.0–36.0)
Platelets: 233 10*3/uL (ref 150–400)
RBC: 3.18 MIL/uL — ABNORMAL LOW (ref 3.87–5.11)
RDW: 16.6 % — ABNORMAL HIGH (ref 11.5–15.5)

## 2012-03-23 LAB — PROTIME-INR: Prothrombin Time: 17.8 seconds — ABNORMAL HIGH (ref 11.6–15.2)

## 2012-03-23 MED ORDER — WARFARIN SODIUM 5 MG PO TABS
5.0000 mg | ORAL_TABLET | Freq: Once | ORAL | Status: AC
  Administered 2012-03-23: 5 mg via ORAL
  Filled 2012-03-23: qty 1

## 2012-03-23 MED ORDER — SODIUM CHLORIDE 0.9 % IJ SOLN
10.0000 mL | Freq: Two times a day (BID) | INTRAMUSCULAR | Status: DC
  Administered 2012-03-25 – 2012-03-27 (×5): 10 mL

## 2012-03-23 MED ORDER — SODIUM CHLORIDE 0.9 % IJ SOLN
10.0000 mL | INTRAMUSCULAR | Status: DC | PRN
  Administered 2012-03-23 – 2012-03-26 (×3): 10 mL

## 2012-03-23 NOTE — Progress Notes (Signed)
ANTICOAGULATION CONSULT NOTE - Follow Up Consult  Pharmacy Consult for heparin/coumadin Indication: atrial fibrillation, St Jude MVR  Labs:  [facility] 03/23/12 0455 03/22/12 0310 03/21/12 0420  HGB 9.5* 10.5* --  HCT 28.9* 31.9* 30.9*  PLT 233 179 201  APTT -- -- --  LABPROT 17.8* 16.3* 15.7*  INR 1.44 1.29 1.22  HEPARINUNFRC 0.42 0.48 0.44  CREATININE -- 1.28* --  CKTOTAL -- -- --  CKMB -- -- --  TROPONINI -- -- --    Medications: Infusions:     . sodium chloride 20 mL/hr at 03/17/12 1245  . dextrose 5 % and 0.9 % NaCl with KCl 20 mEq/L 50 mL/hr at 03/22/12 1613  . heparin 1,200 Units/hr (03/22/12 1613)    Assessment: 71 YOF on warfarin PTA for chronic afib and mechanical MVR. Pt is also followed for infective endocarditis. Presents 6/27 with hepatic bleed and therapeutic INR of 2.75 (goal 2.5 - 3.5) which required reversal. She was given Vitamin K, FFP, and FEIBA. Embolization L hepatic artery was completed on 6/27. Heparin bridge was started 6/27 despite recent bleeding d/t high risk of clotting. Coumadin resumed 6/30   Heparin level is therapeutic this am.  INR responding after coumadin 5mg  x3 days. INR will demonstrate some initial resistance s/p vitamin k and ffp. No bleeding/complications reported.  Hgb decreased slightly today, will monitor.  Goal of Therapy:  Heparin level 0.3-0.7 units/ml Monitor platelets by anticoagulation protocol: Yes INR 2.5-3.0 per Cardilogy 6/29    Plan:   Continue heparin 1200 units/hr  Repeat warfarin 5mg  today.  Daily heparin level, CBC, PT/INR  Patient had questions regarding vitamin K interaction with coumadin; provided written and verbal information on 7/1  [name], PharmD, BCPS Pager: [phone] 03/23/2012 7:56 AM

## 2012-03-23 NOTE — Progress Notes (Signed)
Subjective: Feeling much better, eating some, still a little tender RUQ.  Objective: Vital signs in last 24 hours: Temp:  [98.3 F (36.8 C)-98.6 F (37 C)] 98.6 F (37 C) (07/03 0615) Pulse Rate:  [65-104] 84  (07/03 0941) Resp:  [18] 18  (07/03 0615) BP: (127-137)/(57-82) 127/67 mmHg (07/03 0941) SpO2:  [96 %-97 %] 97 % (07/03 0615) Last BM Date: 03/22/12  680 PO recorded, on a bland diet, HR appears controlled per hospital vital signs, INR 1.44.   Hbg down 1 gram from yesterday. Still on heparin  Intake/Output from previous day: 07/02 0701 - 07/03 0700 In: 2106 [P.O.:680; I.V.:1426] Out: 2552 [Urine:2551; Stool:1] Intake/Output this shift: Total I/O In: -  Out: 450 [Urine:450]  General appearance: alert, cooperative and no distress Resp: clear to auscultation bilaterally GI: soft, still a bit tender RUQ, flat, no distension, tolerating PO, BM yesterday.  Lab Results:   Basename 03/23/12 0455 03/22/12 0310  WBC 7.7 8.5  HGB 9.5* 10.5*  HCT 28.9* 31.9*  PLT 233 179    BMET  Basename 03/22/12 0310  NA 131*  K 4.1  CL 103  CO2 21  GLUCOSE 109*  BUN 7  CREATININE 1.28*  CALCIUM 9.5   PT/INR  Basename 03/23/12 0455 03/22/12 0310  LABPROT 17.8* 16.3*  INR 1.44 1.29     Lab 03/17/12 0025  AST 113*  ALT 57*  ALKPHOS 116  BILITOT 0.5  PROT 7.1  ALBUMIN 3.1*     Lipase     Component Value Date/Time   LIPASE 67* 03/17/2012 0025     Studies/Results: Dg Chest Port 1 View  03/22/2012  *RADIOLOGY REPORT*  Clinical Data: Atelectasis.  PORTABLE CHEST - 1 VIEW  Comparison: 09/29/2011.  Findings: Cardiomegaly is present.  Right upper extremity PICC is present with the tip in the mid SVC.  Subsegmental atelectasis is present in the right lung.  Discoid atelectasis in the left lung along the left heart border.  Mild basilar atelectasis.  No airspace consolidation.  Postoperative changes of median sternotomy.  Mitral valve replacement.  Low volume chest.   IMPRESSION:  1.  New right upper extremity PICC with the tip in the mid SVC. 2.  Low volume chest with bilateral mid to lower lung atelectasis and left greater than right basilar atelectasis.  Original Report Authenticated By: Andreas Newport, M.D.    Medications:    . diltiazem  120 mg Oral Daily  . LORazepam  0.5 mg Oral QHS  . metoprolol tartrate  25 mg Oral BID  . pantoprazole (PROTONIX) IV  40 mg Intravenous QHS  . warfarin  5 mg Oral ONCE-1800  . warfarin  5 mg Oral ONCE-1800  . Warfarin - Pharmacist Dosing Inpatient   Does not apply q1800    Assessment/Plan Spontaneous liver bleed s/p embolization-hgb stable.  S/P mitral valve replacement, St Jude  Sick sinus syndrome with permanent transvenous pacemaker; Dr. Sharrell Ku. 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 Leafy Ro Chronic anticoagulation, ( INR goal 2.5-3.5 under normal circumstances for ST Jude MVR)-coumadin restarted; on heparin  A-fib  Fevers-w/u in progress Hypertension Dyslipidemia Hospitalized with pneumonia and mitral valve endocarditis; November 2012   Plan:  Her telemetry looks like rate is fairly well controlled.  On heparin, we didn't want to push her to fast and be able to reverse heparin in a short time frame if she bled again.  We recheck labs tomorrow, mobilize more, she is going home with her husband.Her INR is till  low.  She can go when cardiology is happy with her heart rate, and her anticoagulation is up to therapeutic.   Aim for d/c Friday, sooner if we can bridge with Lovenox.  She gets her coumadin followed at DR. Little's office. Go to a regular diet     LOS: 7 days    Carrie [name] 03/23/2012

## 2012-03-23 NOTE — Progress Notes (Signed)
Patient seen and examined.  Hemoglobin down today without any hemodynamic compromise.  Anticoagulation continues for mechanical valve.  If hemoglobin continues to fall, will need to stop anticoagulation.

## 2012-03-24 LAB — CULTURE, BLOOD (ROUTINE X 2): Culture: NO GROWTH

## 2012-03-24 LAB — CBC
HCT: 28.4 % — ABNORMAL LOW (ref 36.0–46.0)
MCH: 29.6 pg (ref 26.0–34.0)
MCV: 90.4 fL (ref 78.0–100.0)
Platelets: 285 10*3/uL (ref 150–400)
RDW: 17 % — ABNORMAL HIGH (ref 11.5–15.5)
WBC: 7.9 10*3/uL (ref 4.0–10.5)

## 2012-03-24 LAB — COMPREHENSIVE METABOLIC PANEL
ALT: 116 U/L — ABNORMAL HIGH (ref 0–35)
Alkaline Phosphatase: 192 U/L — ABNORMAL HIGH (ref 39–117)
BUN: 9 mg/dL (ref 6–23)
CO2: 22 mEq/L (ref 19–32)
GFR calc Af Amer: 46 mL/min — ABNORMAL LOW (ref >90)
GFR calc non Af Amer: 39 mL/min — ABNORMAL LOW (ref >90)
Glucose, Bld: 103 mg/dL — ABNORMAL HIGH (ref 70–99)
Potassium: 4.1 mEq/L (ref 3.5–5.1)
Sodium: 135 mEq/L (ref 135–145)

## 2012-03-24 MED ORDER — PANTOPRAZOLE SODIUM 40 MG PO TBEC
40.0000 mg | DELAYED_RELEASE_TABLET | Freq: Every day | ORAL | Status: DC
  Administered 2012-03-24 – 2012-03-29 (×6): 40 mg via ORAL
  Filled 2012-03-24 (×6): qty 1

## 2012-03-24 MED ORDER — WARFARIN SODIUM 7.5 MG PO TABS
7.5000 mg | ORAL_TABLET | Freq: Once | ORAL | Status: AC
  Administered 2012-03-24: 7.5 mg via ORAL
  Filled 2012-03-24: qty 1

## 2012-03-24 NOTE — Progress Notes (Signed)
ANTICOAGULATION CONSULT NOTE - Follow Up Consult  Pharmacy Consult for heparin/coumadin Indication: atrial fibrillation, St Jude MVR  Labs:  [facility] 03/24/12 0518 03/23/12 0455 03/22/12 0310  HGB 9.3* 9.5* --  HCT 28.4* 28.9* 31.9*  PLT 285 233 179  APTT -- -- --  LABPROT 18.7* 17.8* 16.3*  INR 1.53* 1.44 1.29  HEPARINUNFRC 0.38 0.42 0.48  CREATININE 1.33* -- 1.28*  CKTOTAL -- -- --  CKMB -- -- --  TROPONINI -- -- --    Medications: Infusions:     . sodium chloride 20 mL/hr at 03/17/12 1245  . dextrose 5 % and 0.9 % NaCl with KCl 20 mEq/L 50 mL/hr at 03/24/12 0640  . heparin 1,200 Units/hr (03/23/12 1417)    Assessment: 71 YOF on warfarin PTA for chronic afib and mechanical MVR. Pt is also followed for infective endocarditis. Presents 6/27 with hepatic bleed and therapeutic INR of 2.75 (goal 2.5 - 3.5) which required reversal. She was given Vitamin K, FFP, and FEIBA. Embolization L hepatic artery was completed on 6/27. Heparin bridge was started 6/27 despite recent bleeding d/t high risk of clotting. Coumadin resumed 6/30   Heparin level remains therapeutic  INR gradually responding after coumadin 5mg  x4 days. INR will demonstrate some initial resistance s/p vitamin k and ffp. No bleeding/complications reported.  Hgb decreased slightly today, will monitor.  Goal of Therapy:  Heparin level 0.3-0.7 units/ml Monitor platelets by anticoagulation protocol: Yes INR 2.5-3.0 per Cardilogy 6/29    Plan:   Continue heparin 1200 units/hr  Increase warfarin 7.5mg  today.  Daily heparin level, CBC, PT/INR  Patient had questions regarding vitamin K interaction with coumadin; provided written and verbal information on 7/1  Loralee Pacas, PharmD, BCPS Pager: [phone] 03/24/2012 8:00 AM

## 2012-03-24 NOTE — Progress Notes (Signed)
A-fib with better rate control. No evidence for ongoing endocarditis. HCT stable on warfarin. Goal INR is around 3.0 (AVR, MVR, A-fib, but recent bleeding). Will sign-off. Call with questions.  Chrystie Nose, MD, [facility] Attending Cardiologist [facility]

## 2012-03-24 NOTE — Progress Notes (Signed)
Patient ID: Carrie [name], female   DOB: 01-02-1941, 71 y.o.   MRN: [id number]  General Surgery - [facility] - Progress Note  Subjective: Patient up at bedside.  Ate breakfast.  No pain.  No complaints.  Objective: Vital signs in last 24 hours: Temp:  [98.4 F (36.9 C)-98.6 F (37 C)] 98.6 F (37 C) (07/04 0606) Pulse Rate:  [65-87] 68  (07/04 0606) Resp:  [18-20] 18  (07/04 0606) BP: (115-153)/(63-79) 122/63 mmHg (07/04 0902) SpO2:  [97 %-98 %] 98 % (07/04 0606) Last BM Date: 03/23/12  Intake/Output from previous day: 07/03 0701 - 07/04 0700 In: 1104 [P.O.:360; I.V.:734; IV Piggyback:10] Out: 2050 [Urine:2050]  Exam: HEENT - clear, not icteric Abd - soft without distension Ext - no significant edema Neuro - grossly intact, no focal deficits  Lab Results:   Basename 03/24/12 0518 03/23/12 0455  WBC 7.9 7.7  HGB 9.3* 9.5*  HCT 28.4* 28.9*  PLT 285 233     Basename 03/24/12 0518 03/22/12 0310  NA 135 131*  K 4.1 4.1  CL 106 103  CO2 22 21  GLUCOSE 103* 109*  BUN 9 7  CREATININE 1.33* 1.28*  CALCIUM 9.0 9.5    Studies/Results: No results found.  Assessment / Plan: 1.  Spontaneous hepatic bleed, status post embolization  - Hgb stable  - regular diet  - on heparin gtts  - coumadin being administered - follow INR  - home soon  Velora Heckler, MD, [facility] Office: [phone]  03/24/2012

## 2012-03-25 LAB — CBC
HCT: 30.2 % — ABNORMAL LOW (ref 36.0–46.0)
Hemoglobin: 9.9 g/dL — ABNORMAL LOW (ref 12.0–15.0)
MCH: 29.8 pg (ref 26.0–34.0)
MCHC: 32.8 g/dL (ref 30.0–36.0)
MCV: 91 fL (ref 78.0–100.0)
Platelets: 342 K/uL (ref 150–400)
RBC: 3.32 MIL/uL — ABNORMAL LOW (ref 3.87–5.11)
RDW: 16.8 % — ABNORMAL HIGH (ref 11.5–15.5)
WBC: 8.3 K/uL (ref 4.0–10.5)

## 2012-03-25 LAB — PROTIME-INR
INR: 1.72 — ABNORMAL HIGH (ref 0.00–1.49)
Prothrombin Time: 20.5 seconds — ABNORMAL HIGH (ref 11.6–15.2)

## 2012-03-25 LAB — HEPARIN LEVEL (UNFRACTIONATED): Heparin Unfractionated: 0.46 [IU]/mL (ref 0.30–0.70)

## 2012-03-25 MED ORDER — WARFARIN SODIUM 7.5 MG PO TABS
7.5000 mg | ORAL_TABLET | Freq: Once | ORAL | Status: AC
  Administered 2012-03-25: 7.5 mg via ORAL
  Filled 2012-03-25: qty 1

## 2012-03-25 NOTE — Progress Notes (Signed)
ANTICOAGULATION CONSULT NOTE - Follow Up Consult  Pharmacy Consult for heparin/coumadin Indication: atrial fibrillation, St Jude MVR  Labs:  [facility] 03/25/12 0425 03/24/12 0518 03/23/12 0455  HGB 9.9* 9.3* --  HCT 30.2* 28.4* 28.9*  PLT 342 285 233  APTT -- -- --  LABPROT 20.5* 18.7* 17.8*  INR 1.72* 1.53* 1.44  HEPARINUNFRC 0.46 0.38 0.42  CREATININE -- 1.33* --  CKTOTAL -- -- --  CKMB -- -- --  TROPONINI -- -- --    Medications: Infusions:     . sodium chloride 20 mL/hr at 03/17/12 1245  . dextrose 5 % and 0.9 % NaCl with KCl 20 mEq/L 50 mL/hr at 03/24/12 0640  . heparin 1,200 Units/hr (03/24/12 1250)    Assessment: 71 YOF on warfarin PTA for chronic afib and mechanical MVR. Pt is also followed for infective endocarditis. Presents 6/27 with hepatic bleed and therapeutic INR of 2.75 (goal 2.5 - 3.5) which required reversal. She was given Vitamin K, FFP, and FEIBA. Embolization L hepatic artery was completed on 6/27. Heparin bridge was started 6/27 despite recent bleeding d/t high risk of clotting. Coumadin resumed 6/30   Heparin level remains therapeutic  INR gradually responding after coumadin x 5 days. INR will demonstrate some initial resistance s/p vitamin k and ffp.   No bleeding/complications reported.  CBC ok.  Goal of Therapy:  Heparin level 0.3-0.7 units/ml Monitor platelets by anticoagulation protocol: Yes INR 2.5-3.0 per Cardilogy 6/29    Plan:   Continue heparin 1200 units/hr  Warfarin 7.5mg  today.  Daily heparin level, CBC, PT/INR  Patient had questions regarding vitamin K interaction with coumadin; provided written and verbal information on 7/1  [name], PharmD, BCPS Pager: [phone] 03/25/2012 8:10 AM

## 2012-03-25 NOTE — Progress Notes (Addendum)
  Subjective: Still has bouts of discomfort in her abdomen.  She tries to relate it to foods, but it's very inconsistent.  Objective: Vital signs in last 24 hours: Temp:  [98.1 F (36.7 C)-98.3 F (36.8 C)] 98.1 F (36.7 C) (07/05 0505) Pulse Rate:  [65-97] 71  (07/05 0505) Resp:  [18-20] 18  (07/05 0505) BP: (110-145)/(63-82) 134/76 mmHg (07/05 0505) SpO2:  [96 %-98 %] 98 % (07/05 0505) Last BM Date: 03/24/12  Very little PO recorded. Afebrile, VSS, H/H stable. INR 1.7, needs the INR up to 2.5-3.0 range for Mechanical Valve  Intake/Output from previous day: 07/04 0701 - 07/05 0700 In: 1304.7 [P.O.:360; I.V.:944.7] Out: 3200 [Urine:3200] Intake/Output this shift:    General appearance: alert, cooperative and no distress Resp: clear to auscultation bilaterally GI: soft, non-tender currently on exam; bowel sounds normal; no masses,  no organomegaly.  She still reports time of (Pain/discomfort) generalized area of abdomen.  Lab Results:   Basename 03/25/12 0425 03/24/12 0518  WBC 8.3 7.9  HGB 9.9* 9.3*  HCT 30.2* 28.4*  PLT 342 285    BMET  Basename 03/24/12 0518  NA 135  K 4.1  CL 106  CO2 22  GLUCOSE 103*  BUN 9  CREATININE 1.33*  CALCIUM 9.0   PT/INR  Basename 03/25/12 0425 03/24/12 0518  LABPROT 20.5* 18.7*  INR 1.72* 1.53*     Lab 03/24/12 0518  AST 326*  ALT 116*  ALKPHOS 192*  BILITOT 2.2*  PROT 7.3  ALBUMIN 2.7*     Lipase     Component Value Date/Time   LIPASE 67* 03/17/2012 0025     Studies/Results: No results found.  Medications:    . diltiazem  120 mg Oral Daily  . LORazepam  0.5 mg Oral QHS  . metoprolol tartrate  25 mg Oral BID  . pantoprazole  40 mg Oral Q1200  . sodium chloride  10-40 mL Intracatheter Q12H  . warfarin  7.5 mg Oral ONCE-1800  . warfarin  7.5 mg Oral ONCE-1800  . Warfarin - Pharmacist Dosing Inpatient   Does not apply q1800    Assessment/Plan Spontaneous liver bleed s/p embolization of a large  pseudoaneurysm Left hepatic lobe -hgb stable. 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  S/P mitral valve replacement, St Jude  Sick sinus syndrome with permanent transvenous pacemaker; Dr. Sharrell Ku.  History of endocarditis, she has 2 Echos this year so far. 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 Leafy Ro  Chronic anticoagulation, ( INR goal 2.5-3.5 under normal circumstances for ST Jude MVR)-coumadin restarted; on heparin  A-fib now off digoxin and on Cardizem/BB Fevers-w/u in progress  Hypertension  Dyslipidemia  Hospitalized with pneumonia and mitral valve endocarditis; November 2012 Anticoagulation  Chronic with goal of PT-INR 2.5-3.0. Mild renal insuffiencey Creat1.33    Plan: Her WBC remains normal, H/H is stable, INR 1.72, on heparin for AF and St. Jude's valve.  Aim to dc home when INR is theraputic and H/H is stable. Her coumadin is [facility] at [facility].   Noted that there is some concern from DR. 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  We have 2 negative blood cultures obtained 6/28.  Will discuss with Dr. Abbey Chatters.  LOS: 9 days    Carrie [name] 03/25/2012

## 2012-03-25 NOTE — Discharge Summary (Signed)
Physician Discharge Summary  Patient ID: Carrie [name] MRN: [id number] DOB/AGE: 10/30/1940 71 y.o. Dr. Cliffton Asters, ID Dr. Caprice Kluver, Cardiology Dr. Garey Ham, Oncology Dr. Ocie Bob, Primary Care  Admit date: 03/16/2012 Discharge date: 03/29/2012  Admission Diagnoses: 1. Abdominal pain, nausea, vomiting, apparent bleed from the liver.  2. On Coumadin anticoagulation for age fibrillation and St. Jude's mitral valve.  3. Sick sinus syndrome with permanent transvenous pacemaker; Dr. Sharrell Ku.  4. 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 Leafy Ro.  5. Hypertension  6. Dyslipidemia  7. 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  [facility] has another since that in their office, which isn't currently available.  Chronic atrial fibrillation  Normal coronary arteries 2004, normal LVF Jan 2013  [facility] (subarachnoid hemorrhage), December 2012   PROCEDURES: Transcatheter Therapy Embolization, Arteriography, IR US guidance Vascular access, right selective visceral arteriography.  Trans Catheter emobolization with 3  - 4 mm, platinum embolization coils. 03/18/12. Dr. Bonnielee Haff, IR.  Hospital Course:  Patient is a 71 year old African American female, who presents with abdominal pain nausea and vomiting which started last night. She had a similar episode approximately 3-4 weeks ago. She was seen by Dr. Ocie Bob, we treated her for GERD-like symptoms. Patient said she was  doing better and saw Dr. Parke Simmers earlier this week her abdomen was not tender and she was doing well. Last night around 7 PM after eating dinner she started having some nausea, he was eventually able to vomit a small amount. Then she started having abdominal pain. The symptoms did not improve and she was subsequently transported to the emergency room and [facility]. Here she is found to have a white count of 20,500. Hemoglobin 8.2. Pro time of 29.5 INR 2.75. CT scan shows focal contrast collection in left lobe of the liver with complex cystic changes which could represent a complex cyst such as a hemorrhagic or infectious cyst, there is extravasation which could be due to a pseudoaneurysm, hemorrhagic tumor, or portal shunt. There is also a large amount of hemorrhage throughout the peritoneum. We were asked to see in the ER. After exam in the ER she was admitted and sent to IR for arteriogram and eventual embolization as noted above. Her INR was reversed, after her embolization she was transferred to the ICU.  She was also seen by Cardiology who has followed her thru this hospitalization.  Blood cultures were negative times two, 6/28, and 6/30.  She remained hemodynamically stable and was transferred to the floor. She was also transfused in the ICU.  She was placed back on heparin and coumadin for her valves, and afib.  This has been monitored by the Pharmacy. [facility] has followed her for Dr. Clarene Duke and do not think she need further work up at this point.  She has had a slow rise in her INR and today I discussed it with Corine Shelter PA-C at [facility] .  We plan to send home tonight on Lovenox 1mg /kg before discharge tonight, and then coumadin 5 mg daily.  They will recheck her coumadin in 48 hours in their clinic 03/31/12. Currently she is eating a normal diet, ambulating without difficulty. She still has some residual abdominal discomfort, but nothing compared to what she had before admission. She will followup with  [facility] and have her INR checked in 48 hours. She will followup with Dr. Ocie Bob and Dr. Clarene Duke her followup is in 2 weeks. Condition on discharge: Improving.  Disposition: 03-Skilled Nursing Facility  Discharge Orders    Future Appointments: Provider: Department: Dept Phone: Center:   12/01/2012 11:00 AM Radene Gunning [facility] [phone] None   12/01/2012 11:30 AM Victorino December, MD [facility] [phone] None     Medication List  As of 03/29/2012  4:07 PM   STOP taking these medications         digoxin 0.125 MG tablet      famotidine 20 MG tablet         TAKE these medications         acetaminophen 325 MG tablet   Commonly known as: TYLENOL   Take 2 tablets (650 mg total) by mouth every 4 (four) hours as needed.      DEXILANT 30 MG capsule   Generic drug: Dexlansoprazole   Take 30 mg by mouth daily.      diltiazem 120 MG 24 hr capsule   Commonly known as: CARDIZEM CD   Take 1 capsule (120 mg total) by mouth daily.      metoprolol tartrate 25 MG tablet   Commonly known as: LOPRESSOR   Take 1 tablet (25 mg total) by mouth 2 (two) times daily.      warfarin 5 MG tablet   Commonly known as: COUMADIN   Take 1 tablet (5 mg total) by mouth one time only at 6 PM.           Follow-up Information    Schedule an appointment as soon as possible for a visit with BLAND,VEITA J, MD. (1-2 weeks after discharge)    Contact information:   [address] [place] [address] [phone]       Follow up with LITTLE, ALFRED B, MD. (Call and make an appointment for follow up with Dr. Clarene Duke in 2 weeks)    Contact information:   [address] [place] [address] [phone]       Follow up with LITTLE, ALFRED B, MD in 2 days. (Make an appointment to have your coumadin checked  and followed by DR. Littles office.)    Contact information:   [address] [place] [address] [phone]        Follow up with Robyne Askew, MD. Schedule an appointment as soon as possible for a visit in 2 weeks. (Follow up with DR.Carolynne Edouard in 2-3 weeks.  )    Contact information:   [employer] [address] [place] [address] [phone]          Signed: Sherrie George 03/29/2012, 4:07 PM

## 2012-03-25 NOTE — Progress Notes (Signed)
The findings are arteriogram will need to be reviewed by Cardiology and further evaluation, if needed, will be based on their assessment.

## 2012-03-25 NOTE — Progress Notes (Signed)
Patient seen and examined.  The bleeding appears to have come from a hemorrhagic cyst or solid lesion in the left lobe of the liver.  She is better overall.  Will need to have a follow up imaging study of the liver in the future.

## 2012-03-26 LAB — CBC
MCH: 29.8 pg (ref 26.0–34.0)
MCHC: 32.7 g/dL (ref 30.0–36.0)
MCV: 91.2 fL (ref 78.0–100.0)
Platelets: 404 10*3/uL — ABNORMAL HIGH (ref 150–400)
RDW: 16.6 % — ABNORMAL HIGH (ref 11.5–15.5)

## 2012-03-26 LAB — CULTURE, BLOOD (ROUTINE X 2): Culture: NO GROWTH

## 2012-03-26 MED ORDER — WARFARIN SODIUM 5 MG PO TABS
5.0000 mg | ORAL_TABLET | Freq: Once | ORAL | Status: AC
  Administered 2012-03-26: 5 mg via ORAL
  Filled 2012-03-26: qty 1

## 2012-03-26 NOTE — Progress Notes (Addendum)
ANTICOAGULATION CONSULT NOTE - Follow Up Consult  Pharmacy Consult for Heparin/Coumadin Indication: atrial fibrillation, St Jude MVR  Labs:  Basename 03/26/12 1532 03/26/12 0518 03/25/12 0425 03/24/12 0518  HGB -- 9.8* 9.9* --  HCT -- 30.0* 30.2* 28.4*  PLT -- 404* 342 285  APTT -- -- -- --  LABPROT -- 23.1* 20.5* 18.7*  INR -- 2.01* 1.72* 1.53*  HEPARINUNFRC 0.53 0.89* 0.46 --  CREATININE -- -- -- 1.33*  CKTOTAL -- -- -- --  CKMB -- -- -- --  TROPONINI -- -- -- --    Medications: Infusions:     . sodium chloride 20 mL/hr at 03/17/12 1245  . dextrose 5 % and 0.9 % NaCl with KCl 20 mEq/L 50 mL/hr at 03/25/12 2155  . heparin 900 Units/hr (03/26/12 0934)    Assessment: 71 YOF on warfarin PTA for chronic afib and mechanical St. Jude MVR. Pt is also followed for infective endocarditis. Presents 6/27 with hepatic bleed and therapeutic INR of 2.75 (goal 2.5 - 3.5) which required reversal. She was given Vitamin K, FFP, and FEIBA. Embolization L hepatic artery was completed on 6/27. Heparin bridge was started 6/27 despite recent bleeding d/t high risk of clotting. Coumadin resumed 6/30. Home warfarin dose reported as 2.5mg  PO on Mondays, with 5mg  on all other days of the week.   Heparin level supratherapeutic this am. Confirmed with RN that heparin is infusing at 1200 units/hr (12 ml/hr)  INR gradually responding after coumadin x 6 days. INR will demonstrate some initial resistance s/p vitamin k and ffp.   No bleeding/complications reported per RN.  CBC ok.  Goal of Therapy:  Heparin level 0.3-0.7 units/ml Monitor platelets by anticoagulation protocol: Yes INR 2.5-3.0 per Cardilogy 6/29    Plan:   Decrease heparin to 900 units/hr  Warfarin 5mg  today.  Heparin level in 6 hours  Daily heparin level, CBC, PT/INR  Patient had questions regarding vitamin K interaction with coumadin; provided written and verbal information on 7/1 by previous Pharmacist  Darrol Angel,  PharmD Pager: [phone] 03/26/2012 4:14 PM  Addendum: Heparin level now in target range on 900units/hr.  Cont same and f/u heparin level in am.  Charolotte Eke, PharmD, pager [phone]. 03/26/2012,4:14 PM.

## 2012-03-26 NOTE — Progress Notes (Signed)
  Subjective: Minimal RUQ soreness.  Objective: Vital signs in last 24 hours: Temp:  [98.1 F (36.7 C)-98.9 F (37.2 C)] 98.9 F (37.2 C) (07/06 0537) Pulse Rate:  [81-98] 89  (07/06 0537) Resp:  [16-20] 18  (07/06 0537) BP: (118-136)/(65-82) 124/71 mmHg (07/06 0537) SpO2:  [95 %-98 %] 95 % (07/06 0537) Last BM Date: 03/24/12  Intake/Output from previous day: 07/05 0701 - 07/06 0700 In: 2383.5 [P.O.:720; I.V.:1663.5] Out: 2900 [Urine:2900] Intake/Output this shift: Total I/O In: -  Out: 400 [Urine:400]  PE: Abd-soft, non RUQ tenderness  Lab Results:   Basename 03/26/12 0518 03/25/12 0425  WBC 8.4 8.3  HGB 9.8* 9.9*  HCT 30.0* 30.2*  PLT 404* 342   BMET  Basename 03/24/12 0518  NA 135  K 4.1  CL 106  CO2 22  GLUCOSE 103*  BUN 9  CREATININE 1.33*  CALCIUM 9.0   PT/INR  Basename 03/26/12 0518 03/25/12 0425  LABPROT 23.1* 20.5*  INR 2.01* 1.72*   Comprehensive Metabolic Panel:    Component Value Date/Time   NA 135 03/24/2012 0518   NA 138 03/14/2010 1318   K 4.1 03/24/2012 0518   K 4.1 03/14/2010 1318   CL 106 03/24/2012 0518   CL 100 03/14/2010 1318   CO2 22 03/24/2012 0518   CO2 27 03/14/2010 1318   BUN 9 03/24/2012 0518   BUN 14 03/14/2010 1318   CREATININE 1.33* 03/24/2012 0518   CREATININE 0.63 10/28/2011 1453   GLUCOSE 103* 03/24/2012 0518   GLUCOSE 101 03/14/2010 1318   CALCIUM 9.0 03/24/2012 0518   CALCIUM 9.9 03/14/2010 1318   AST 326* 03/24/2012 0518   AST 46* 03/14/2010 1318   ALT 116* 03/24/2012 0518   ALKPHOS 192* 03/24/2012 0518   ALKPHOS 125* 03/14/2010 1318   BILITOT 2.2* 03/24/2012 0518   BILITOT 0.70 03/14/2010 1318   PROT 7.3 03/24/2012 0518   PROT 8.8* 03/14/2010 1318   ALBUMIN 2.7* 03/24/2012 0518     Studies/Results: No results found.  Anti-infectives: Anti-infectives     Start     Dose/Rate Route Frequency Ordered Stop   03/17/12 1330   ceFAZolin (ANCEF) IVPB 1 g/50 mL premix        1 g 100 mL/hr over 30 Minutes Intravenous  Once 03/17/12 1322  03/17/12 1355   03/17/12 1223   ceFAZolin (ANCEF) 1-5 GM-% IVPB     Comments: Carrie [name], Carrie [name]: cabinet override         03/17/12 1223 03/18/12 0029          Assessment Principal Problem:  *Hemorrhage, hepatic, Rx'd with Coumadin reversal and embolisation 03/17/12-hemoglobin remains stable Active Problems:  S/P mitral valve replacement, St Jude  Presence of permanent cardiac pacemaker  Chronic anticoagulation, ( INR goal 2.5-3.5 under normal circumstances for ST Jude MVR)-INR not quite in therapeutic range.  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  Home when INR therapeutic.   Carrie [name] 03/26/2012

## 2012-03-27 LAB — PROTIME-INR: INR: 2.11 — ABNORMAL HIGH (ref 0.00–1.49)

## 2012-03-27 LAB — CBC
HCT: 30.1 % — ABNORMAL LOW (ref 36.0–46.0)
Hemoglobin: 9.9 g/dL — ABNORMAL LOW (ref 12.0–15.0)
MCH: 30 pg (ref 26.0–34.0)
MCHC: 32.9 g/dL (ref 30.0–36.0)
MCV: 91.2 fL (ref 78.0–100.0)

## 2012-03-27 LAB — HEPARIN LEVEL (UNFRACTIONATED): Heparin Unfractionated: 0.49 IU/mL (ref 0.30–0.70)

## 2012-03-27 MED ORDER — WARFARIN SODIUM 5 MG PO TABS
5.0000 mg | ORAL_TABLET | Freq: Once | ORAL | Status: AC
  Administered 2012-03-27: 5 mg via ORAL
  Filled 2012-03-27: qty 1

## 2012-03-27 NOTE — Progress Notes (Signed)
ANTICOAGULATION CONSULT NOTE - Follow Up Consult  Pharmacy Consult for Heparin/Coumadin Indication: atrial fibrillation, St Jude MVR  Labs:  Basename 03/27/12 0520 03/26/12 1532 03/26/12 0518 03/25/12 0425  HGB 9.9* -- 9.8* --  HCT 30.1* -- 30.0* 30.2*  PLT 451* -- 404* 342  APTT -- -- -- --  LABPROT 24.0* -- 23.1* 20.5*  INR 2.11* -- 2.01* 1.72*  HEPARINUNFRC 0.49 0.53 0.89* --  CREATININE -- -- -- --  CKTOTAL -- -- -- --  CKMB -- -- -- --  TROPONINI -- -- -- --    Medications: Infusions:     . sodium chloride 20 mL/hr at 03/17/12 1245  . dextrose 5 % and 0.9 % NaCl with KCl 20 mEq/L 50 mL/hr at 03/26/12 1728  . heparin 900 Units/hr (03/27/12 0700)    Assessment: 71 YOF on warfarin PTA for chronic afib and mechanical St. Jude MVR. Pt is also followed for infective endocarditis. Presents 6/27 with hepatic bleed and therapeutic INR of 2.75 (goal 2.5 - 3.5) which required reversal. She was given Vitamin K, FFP, and FEIBA. Embolization L hepatic artery was completed on 6/27. Heparin bridge was started 6/27 despite recent bleeding d/t high risk of clotting. Coumadin resumed 6/30. Home warfarin dose reported as 2.5mg  PO on Mondays, with 5mg  on all other days of the week.   Heparin level remains therapeutic this am.   INR gradually responding after coumadin x 7 days. INR will demonstrate some initial resistance s/p vitamin k and ffp.    Note tighter INR goal per cards (2.5-3) - using careful warfarin dose titration to achieve this narrower goal range. No bleeding/complications reported.  CBC ok.  Goal of Therapy:  Heparin level 0.3-0.7 units/ml Monitor platelets by anticoagulation protocol: Yes INR 2.5-3.0 per Cardilogy 6/29    Plan:   Continue heparin at 900 units/hr  Warfarin 5mg  today.  Daily heparin level, CBC, PT/INR  Patient had questions regarding vitamin K interaction with coumadin; provided written and verbal information on 7/1 by previous Pharmacist  Darrol Angel, PharmD Pager: [phone] 03/27/2012 9:33 AM

## 2012-03-27 NOTE — Progress Notes (Signed)
Patient ID: Carrie [name], female   DOB: 05-06-1941, 71 y.o.   MRN: [id number] [facility] Progress Note:   * No surgery found *  Subjective: Mental status is clear.  Up walking about without complaints Objective: Vital signs in last 24 hours: Temp:  [97.5 F (36.4 C)-99.2 F (37.3 C)] 98.8 F (37.1 C) (07/07 0626) Pulse Rate:  [75-94] 84  (07/07 0626) Resp:  [18-20] 18  (07/07 0626) BP: (117-138)/(66-88) 117/66 mmHg (07/07 0626) SpO2:  [98 %-100 %] 98 % (07/07 0626)  Intake/Output from previous day: 07/06 0701 - 07/07 0700 In: 1588 [P.O.:480; I.V.:1108] Out: 3500 [Urine:3500] Intake/Output this shift: Total I/O In: -  Out: 500 [Urine:500]  Physical Exam: Work of breathing is  Normal.  On heparin and warfarin with INR 2.1.  Target INR 2.5 with St Jude valve.  She is followed by Dr. Clarene Duke.    Lab Results:  Results for orders placed during the hospital encounter of 03/16/12 (from the past 48 hour(s))  HEPARIN LEVEL (UNFRACTIONATED)     Status: Abnormal   Collection Time   03/26/12  5:18 AM      Component Value Range Comment   Heparin Unfractionated 0.89 (*) 0.30 - 0.70 IU/mL   CBC     Status: Abnormal   Collection Time   03/26/12  5:18 AM      Component Value Range Comment   WBC 8.4  4.0 - 10.5 K/uL    RBC 3.29 (*) 3.87 - 5.11 MIL/uL    Hemoglobin 9.8 (*) 12.0 - 15.0 g/dL    HCT 56.2 (*) 13.0 - 46.0 %    MCV 91.2  78.0 - 100.0 fL    MCH 29.8  26.0 - 34.0 pg    MCHC 32.7  30.0 - 36.0 g/dL    RDW 86.5 (*) 78.4 - 15.5 %    Platelets 404 (*) 150 - 400 K/uL   PROTIME-INR     Status: Abnormal   Collection Time   03/26/12  5:18 AM      Component Value Range Comment   Prothrombin Time 23.1 (*) 11.6 - 15.2 seconds    INR 2.01 (*) 0.00 - 1.49   HEPARIN LEVEL (UNFRACTIONATED)     Status: Normal   Collection Time   03/26/12  3:32 PM      Component Value Range Comment   Heparin Unfractionated 0.53  0.30 - 0.70 IU/mL   HEPARIN LEVEL (UNFRACTIONATED)     Status: Normal   Collection Time   03/27/12  5:20 AM      Component Value Range Comment   Heparin Unfractionated 0.49  0.30 - 0.70 IU/mL   CBC     Status: Abnormal   Collection Time   03/27/12  5:20 AM      Component Value Range Comment   WBC 9.0  4.0 - 10.5 K/uL    RBC 3.30 (*) 3.87 - 5.11 MIL/uL    Hemoglobin 9.9 (*) 12.0 - 15.0 g/dL    HCT 69.6 (*) 29.5 - 46.0 %    MCV 91.2  78.0 - 100.0 fL    MCH 30.0  26.0 - 34.0 pg    MCHC 32.9  30.0 - 36.0 g/dL    RDW 28.4 (*) 13.2 - 15.5 %    Platelets 451 (*) 150 - 400 K/uL   PROTIME-INR     Status: Abnormal   Collection Time   03/27/12  5:20 AM      Component Value Range  Comment   Prothrombin Time 24.0 (*) 11.6 - 15.2 seconds    INR 2.11 (*) 0.00 - 1.49     Radiology/Results: No results found.  Anti-infectives: Anti-infectives     Start     Dose/Rate Route Frequency Ordered Stop   03/17/12 1330   ceFAZolin (ANCEF) IVPB 1 g/50 mL premix        1 g 100 mL/hr over 30 Minutes Intravenous  Once 03/17/12 1322 03/17/12 1355   03/17/12 1223   ceFAZolin (ANCEF) 1-5 GM-% IVPB     Comments: KIRKMAN, JENNIFER: cabinet override         03/17/12 1223 03/18/12 0029          Assessment/Plan: Problem List: Patient Active Problem List  Diagnosis  . CONSTIPATION  . S/P mitral valve replacement, St Jude  . Muscle weakness of lower extremity  . Presence of permanent cardiac pacemaker  . Chronic anticoagulation, ( INR goal 2.5-3.5 under normal circumstances for ST Jude MVR)  . 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 Chronic atrial fibrillation  . Hemorrhage, hepatic, Rx'd with Coumadin reversal and embolisation 03/17/12  . Normal coronary arteries 2004, normal LVF Jan 2013  . [facility] (subarachnoid hemorrhage), December 2012    Almost at target INR for discharge.  Hopefully able to go tomorrow * No surgery found *    LOS: 11 days   Matt B. Daphine Deutscher, MD, [facility] [phone] beeper [phone]  03/27/2012 10:10 AM

## 2012-03-28 LAB — HEPARIN LEVEL (UNFRACTIONATED): Heparin Unfractionated: 0.48 IU/mL (ref 0.30–0.70)

## 2012-03-28 LAB — PROTIME-INR: Prothrombin Time: 25.4 seconds — ABNORMAL HIGH (ref 11.6–15.2)

## 2012-03-28 LAB — CBC
HCT: 31.1 % — ABNORMAL LOW (ref 36.0–46.0)
Hemoglobin: 10.3 g/dL — ABNORMAL LOW (ref 12.0–15.0)
MCH: 30 pg (ref 26.0–34.0)
RBC: 3.43 MIL/uL — ABNORMAL LOW (ref 3.87–5.11)

## 2012-03-28 MED ORDER — HEPARIN (PORCINE) IN NACL 100-0.45 UNIT/ML-% IJ SOLN
1100.0000 [IU]/h | INTRAMUSCULAR | Status: DC
  Administered 2012-03-28 – 2012-03-29 (×2): 1100 [IU]/h via INTRAVENOUS
  Filled 2012-03-28 (×2): qty 250

## 2012-03-28 MED ORDER — WARFARIN SODIUM 5 MG PO TABS
5.0000 mg | ORAL_TABLET | Freq: Once | ORAL | Status: AC
  Administered 2012-03-28: 5 mg via ORAL
  Filled 2012-03-28: qty 1

## 2012-03-28 NOTE — Progress Notes (Signed)
Hgb stable on anticoagulation INR almost therapeudic Anticipate D/C tomorrow

## 2012-03-28 NOTE — Progress Notes (Addendum)
ANTICOAGULATION CONSULT NOTE - Follow Up Consult  Pharmacy Consult for Heparin/ warfarin Indication: atrial fibrillation, St Jude MVR  Labs:  [facility] 03/28/12 0411 03/27/12 0520 03/26/12 1532 03/26/12 0518  HGB 10.3* 9.9* -- --  HCT 31.1* 30.1* -- 30.0*  PLT 471* 451* -- 404*  APTT -- -- -- --  LABPROT 25.4* 24.0* -- 23.1*  INR 2.27* 2.11* -- 2.01*  HEPARINUNFRC 0.26* 0.49 0.53 --  CREATININE -- -- -- --  CKTOTAL -- -- -- --  CKMB -- -- -- --  TROPONINI -- -- -- --    Medications: Infusions:     . sodium chloride 20 mL/hr at 03/17/12 1245  . dextrose 5 % and 0.9 % NaCl with KCl 20 mEq/L 50 mL/hr at 03/27/12 1313  . heparin 900 Units/hr (03/27/12 1214)    Assessment: 71 YOF on warfarin PTA for chronic afib and mechanical St. Jude MVR. Pt is also followed for infective endocarditis. Presents 6/27 with hepatic bleed and therapeutic INR of 2.75 (goal 2.5 - 3.5) which required reversal. She was given Vitamin K, FFP, and FEIBA. Embolization L hepatic artery was completed on 6/27. Heparin bridge was started 6/27 despite recent bleeding d/t high risk of clotting. Coumadin resumed 6/30. Home warfarin dose reported as 2.5mg  PO on Mondays, with 5mg  on all other days of the week.   Heparin level has decreased to sub-therapeutic this AM.  Rn reports no interruptions or complications.  INR increasing towards therapeutic range.  INR will demonstrate some initial resistance s/p vitamin k and ffp.    Note tighter INR goal per cards (2.5-3) - using careful warfarin dose titration to achieve this narrower goal range. No bleeding/complications reported.  CBC ok.  Goal of Therapy:  Heparin level 0.3-0.7 units/ml Monitor platelets by anticoagulation protocol: Yes INR 2.5-3.0 per Cardilogy 6/29    Plan:   Increase heparin to 1100 units/hr  Heparin level in 8 hours  Warfarin 5mg  today.  Daily heparin level, CBC, PT/INR  Patient was provided written and verbal warfarin education on 7/1  by a pharmacist.   Lynann Beaver PharmD, BCPS Pager [phone] 03/28/2012 8:02 AM  ADDENDUM: 03/28/2012 5:21 PM A: Heparin level returned within goal range (0.48) following increase in infusion rate to 1100 units/hr.  P: Continue heparin rate at 1100 units/hr (11 mL/hr) and recheck heparin level when drawn with AM labs to confirm rate.  [name], PharmD, BCPS Pager: [phone] 03/28/2012 5:25 PM

## 2012-03-28 NOTE — Progress Notes (Signed)
  Subjective: Still has some abdominal tenderness, but nothing like when she came in.  Objective: Vital signs in last 24 hours: Temp:  [98.1 F (36.7 C)-98.8 F (37.1 C)] 98.8 F (37.1 C) (07/08 0455) Pulse Rate:  [76-88] 88  (07/08 0957) Resp:  [18-19] 18  (07/08 0957) BP: (118-132)/(57-81) 132/76 mmHg (07/08 0957) SpO2:  [98 %-100 %] 100 % (07/08 0957) Last BM Date: 03/27/12  Regular diet,  VSS INR 2.27, WBC 10.3  H/H is stable. 12 days out from bleed. Intake/Output from previous day: 07/07 0701 - 07/08 0700 In: 1540 [P.O.:980; I.V.:560] Out: 3552 [Urine:3550; Stool:2] Intake/Output this shift:    General appearance: alert, cooperative and no distress Resp: clear to auscultation bilaterally GI: soft, abdomen remains sore, not acutely sore like on admission.  No distension, normal bowel funcitonj.  Lab Results:   Basename 03/28/12 0411 03/27/12 0520  WBC 10.3 9.0  HGB 10.3* 9.9*  HCT 31.1* 30.1*  PLT 471* 451*    BMET No results found for this basename: NA:2,K:2,CL:2,CO2:2,GLUCOSE:2,BUN:2,CREATININE:2,CALCIUM:2 in the last 72 hours PT/INR  Basename 03/28/12 0411 03/27/12 0520  LABPROT 25.4* 24.0*  INR 2.27* 2.11*     Lab 03/24/12 0518  AST 326*  ALT 116*  ALKPHOS 192*  BILITOT 2.2*  PROT 7.3  ALBUMIN 2.7*     Lipase     Component Value Date/Time   LIPASE 67* 03/17/2012 0025     Studies/Results: No results found.  Medications:    . diltiazem  120 mg Oral Daily  . LORazepam  0.5 mg Oral QHS  . metoprolol tartrate  25 mg Oral BID  . pantoprazole  40 mg Oral Q1200  . sodium chloride  10-40 mL Intracatheter Q12H  . warfarin  5 mg Oral ONCE-1800  . warfarin  5 mg Oral ONCE-1800  . 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 [facility] has another since that in their office, which isn't currently available.  Chronic atrial fibrillation  Normal coronary arteries 2004, normal LVF Jan 2013  [facility] (subarachnoid hemorrhage), December 2012   Plan:  Continue heparin and coumadin home when pharmacy has her therapeutic on coumadin 2.5-3.5 INR.  LOS: 12 days    Gerado Nabers 03/28/2012

## 2012-03-29 LAB — PROTIME-INR
INR: 2.39 — ABNORMAL HIGH (ref 0.00–1.49)
Prothrombin Time: 26.5 seconds — ABNORMAL HIGH (ref 11.6–15.2)

## 2012-03-29 LAB — CBC
MCH: 29.7 pg (ref 26.0–34.0)
Platelets: 506 10*3/uL — ABNORMAL HIGH (ref 150–400)
RBC: 3.44 MIL/uL — ABNORMAL LOW (ref 3.87–5.11)
WBC: 10.7 10*3/uL — ABNORMAL HIGH (ref 4.0–10.5)

## 2012-03-29 LAB — HEPARIN LEVEL (UNFRACTIONATED): Heparin Unfractionated: 0.67 IU/mL (ref 0.30–0.70)

## 2012-03-29 MED ORDER — DILTIAZEM HCL ER COATED BEADS 120 MG PO CP24: 120.0000 mg | ORAL_CAPSULE | Freq: Every day | ORAL | Status: DC

## 2012-03-29 MED ORDER — ACETAMINOPHEN 650 MG RE SUPP: 650.0000 mg | RECTAL | Status: DC | PRN

## 2012-03-29 MED ORDER — ACETAMINOPHEN 325 MG PO TABS: 650.0000 mg | ORAL_TABLET | ORAL | Status: DC | PRN

## 2012-03-29 MED ORDER — ENOXAPARIN SODIUM 60 MG/0.6ML ~~LOC~~ SOLN
1.0000 mg/kg | Freq: Once | SUBCUTANEOUS | Status: AC
  Administered 2012-03-29: 60 mg via SUBCUTANEOUS
  Filled 2012-03-29: qty 0.6

## 2012-03-29 MED ORDER — WARFARIN SODIUM 5 MG PO TABS
5.0000 mg | ORAL_TABLET | Freq: Once | ORAL | Status: AC
  Administered 2012-03-29: 5 mg via ORAL
  Filled 2012-03-29: qty 1

## 2012-03-29 MED ORDER — METOPROLOL TARTRATE 25 MG PO TABS: 25.0000 mg | ORAL_TABLET | Freq: Two times a day (BID) | ORAL | Status: DC

## 2012-03-29 MED ORDER — WARFARIN SODIUM 5 MG PO TABS: 5.0000 mg | ORAL_TABLET | Freq: Once | ORAL | Status: DC

## 2012-03-29 NOTE — Progress Notes (Signed)
ANTICOAGULATION CONSULT NOTE - Follow Up Consult  Pharmacy Consult for Heparin/ Warfarin Indication: atrial fibrillation, St Jude MVR  Labs:  Basename 03/29/12 0416 03/28/12 1611 03/28/12 0411 03/27/12 0520  HGB 10.2* -- 10.3* --  HCT 31.2* -- 31.1* 30.1*  PLT 506* -- 471* 451*  APTT -- -- -- --  LABPROT 26.5* -- 25.4* 24.0*  INR 2.39* -- 2.27* 2.11*  HEPARINUNFRC 0.67 0.48 0.26* --  CREATININE -- -- -- --  CKTOTAL -- -- -- --  CKMB -- -- -- --  TROPONINI -- -- -- --    Medications: Infusions:     . sodium chloride 20 mL/hr at 03/17/12 1245  . dextrose 5 % and 0.9 % NaCl with KCl 20 mEq/L 50 mL/hr at 03/29/12 0446  . heparin 1,100 Units/hr (03/28/12 1459)  . DISCONTD: heparin 900 Units/hr (03/27/12 1214)    Assessment: 71 YOF on warfarin PTA for chronic afib and mechanical St. Jude MVR. Pt is also followed for infective endocarditis. Presents 6/27 with hepatic bleed and therapeutic INR of 2.75 (goal 2.5 - 3.5) which required reversal. She was given Vitamin K, FFP, and FEIBA. Embolization L hepatic artery was completed on 6/27. Heparin bridge was started 6/27 despite recent bleeding d/t high risk of clotting. Coumadin resumed 6/30. Home warfarin dose reported as 2.5mg  PO on Mondays, with 5mg  on all other days of the week.   Heparin level is therapeutic this AM.    INR progressing steadily towards therapeutic range.  INR will demonstrate some initial resistance s/p vitamin k and ffp.    Note tighter INR goal per cards (2.5-3) given recent bleeding - using careful warfarin dose titration to achieve this narrower goal range. No bleeding/complications reported.  CBC ok.  Goal of Therapy:  Heparin level 0.3-0.7 units/ml Monitor platelets by anticoagulation protocol: Yes INR 2.5-3.0 per [facility] 6/29    Plan:   Continue heparin at 1100 units/hr  Warfarin 5mg  today.  Daily heparin level, CBC, PT/INR  Patient was provided written and verbal warfarin education on 7/1 by a  pharmacist.  Could consider switching IV heparin to SQ Lovenox and sending out on Lovenox 60mg  SQ q12h in order to facilitate discharge, with next INR check tomorrow as an outpatient.   Darrol Angel, PharmD Pager: [phone] 03/29/2012 7:09 AM

## 2012-03-29 NOTE — Progress Notes (Signed)
Agree w getting more advice on anticoagulation from Cardiology No acute issues

## 2012-03-29 NOTE — Progress Notes (Addendum)
  Subjective: Feels fine, just waiting on INR.  Objective: Vital signs in last 24 hours: Temp:  [97.5 F (36.4 C)-98.2 F (36.8 C)] 97.5 F (36.4 C) (07/09 0933) Pulse Rate:  [78-101] 82  (07/09 0933) Resp:  [16-18] 18  (07/09 0933) BP: (117-146)/(60-83) 146/69 mmHg (07/09 0933) SpO2:  [96 %-100 %] 96 % (07/09 0933) Last BM Date: 03/28/12  Intake/Output from previous day: 07/08 0701 - 07/09 0700 In: 1180 [P.O.:680; I.V.:500] Out: 2400 [Urine:2400] Intake/Output this shift: Total I/O In: 240 [P.O.:240] Out: -   General appearance: alert, cooperative and no distress Resp: clear to auscultation bilaterally GI: soft, still a bit tender/sore in places, but over all feeling fine.  Lab Results:   Basename 03/29/12 0416 03/28/12 0411  WBC 10.7* 10.3  HGB 10.2* 10.3*  HCT 31.2* 31.1*  PLT 506* 471*    BMET No results found for this basename: NA:2,K:2,CL:2,CO2:2,GLUCOSE:2,BUN:2,CREATININE:2,CALCIUM:2 in the last 72 hours PT/INR  Basename 03/29/12 0416 03/28/12 0411  LABPROT 26.5* 25.4*  INR 2.39* 2.27*     Lab 03/24/12 0518  AST 326*  ALT 116*  ALKPHOS 192*  BILITOT 2.2*  PROT 7.3  ALBUMIN 2.7*     Lipase     Component Value Date/Time   LIPASE 67* 03/17/2012 0025     Studies/Results: No results found.  Medications:    . diltiazem  120 mg Oral Daily  . LORazepam  0.5 mg Oral QHS  . metoprolol tartrate  25 mg Oral BID  . pantoprazole  40 mg Oral Q1200  . sodium chloride  10-40 mL Intracatheter Q12H  . warfarin  5 mg Oral ONCE-1800  . warfarin  5 mg Oral ONCE-1800  . Warfarin - Pharmacist Dosing Inpatient   Does not apply q1800    Assessment/Plan Hemorrhage, hepatic, Rx'd with Coumadin reversal and embolization 03/17/12  Active Problems:  S/P mitral valve replacement, St Jude  Chronic anticoagulation, ( INR goal 2.5-3.5 under normal circumstances for ST Jude MVR) Getting coumadin 5 mg perday last 3 days. 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 [facility] has another since that in their office, which isn't currently available.  Chronic atrial fibrillation  Normal coronary arteries 2004, normal LVF Jan 2013  [facility] (subarachnoid hemorrhage), December 2012   I will contact [facility] and see how they feel about switching over to Lovenox for a couple days as INR goes up slowly.  With hx of mechanical valve, endocarditis, hepatic bleed and hx of SAH last year I think we should have everyone's input.  I have talked to Corine Shelter and he will get one of his Cardiologist to see patient again today. I talked with [facility] and they want to send her home with home dose of Lovenox tonight, coumadin 5 mg daily and they will follow up with her coumadin in clinic in 48 hours..  LOS: 13 days    Jeyson Deshotel 03/29/2012

## 2012-03-29 NOTE — Progress Notes (Signed)
Discharge to home patient alert and oriented., family at bedside. PICC line removed by IV Nurse no bleeding noted on insertion site, dressing dry and intact upon discharged. D/C instructions and follow up appointment  done and given to the patient.

## 2012-03-29 NOTE — Progress Notes (Signed)
D/c when anticoagulation acceptable to [facility]

## 2012-03-30 NOTE — Discharge Summary (Signed)
Agree with summary. 

## 2012-04-18 ENCOUNTER — Other Ambulatory Visit (INDEPENDENT_AMBULATORY_CARE_PROVIDER_SITE_OTHER): Payer: Self-pay | Admitting: General Surgery

## 2012-04-18 ENCOUNTER — Ambulatory Visit (INDEPENDENT_AMBULATORY_CARE_PROVIDER_SITE_OTHER): Payer: Medicare Other | Admitting: General Surgery

## 2012-04-18 ENCOUNTER — Encounter (INDEPENDENT_AMBULATORY_CARE_PROVIDER_SITE_OTHER): Payer: Self-pay | Admitting: General Surgery

## 2012-04-18 VITALS — BP 116/65 | HR 102 | Temp 97.3°F | Resp 14 | Ht 61.0 in | Wt 125.8 lb

## 2012-04-18 DIAGNOSIS — R58 Hemorrhage, not elsewhere classified: Secondary | ICD-10-CM

## 2012-04-18 NOTE — Progress Notes (Signed)
Subjective:     Patient ID: Quintella Reichert, female   DOB: [birth year], 71 y.o.   MRN: [id number]  HPI The patient is a 71 year old black female who was recently hospitalized with a spontaneous hemorrhage from her liver. This required interventional radiology to embolize the vessel. She tolerated this well. She initially had a lot of abdominal pain but this is now resolved. Her only complaint now is of her stools being hard. Her appetite is improving  Review of Systems  Constitutional: Negative.   HENT: Negative.   Eyes: Negative.   Respiratory: Negative.   Cardiovascular: Negative.   Gastrointestinal: Positive for constipation.  Genitourinary: Negative.   Musculoskeletal: Negative.   Skin: Negative.   Neurological: Negative.   Hematological: Negative.   Psychiatric/Behavioral: Negative.        Objective:   Physical Exam  Constitutional: She is oriented to person, place, and time. She appears well-developed and well-nourished.  HENT:  Head: Normocephalic and atraumatic.  Eyes: Conjunctivae and EOM are normal. Pupils are equal, round, and reactive to light.  Neck: Normal range of motion. Neck supple.  Cardiovascular: Normal rate, regular rhythm and normal heart sounds.   Pulmonary/Chest: Effort normal and breath sounds normal.  Abdominal: Soft. Bowel sounds are normal. She exhibits no mass. There is no tenderness.  Musculoskeletal: Normal range of motion.  Neurological: She is alert and oriented to person, place, and time.  Skin: Skin is warm and dry.  Psychiatric: She has a normal mood and affect. Her behavior is normal.       Assessment:     Spontaneous hemorrhage from the liver requiring embolization    Plan:     She seems to be doing very well now. We will plan to get an ultrasound to follow up on her liver. We will call her with results of this. If this is fairly unremarkable then we will turn her care back over to her medical doctors

## 2012-04-18 NOTE — Patient Instructions (Addendum)
We will call with results of ultrasound Use miralax to prevent constipation

## 2012-04-20 ENCOUNTER — Ambulatory Visit (HOSPITAL_COMMUNITY)
Admission: RE | Admit: 2012-04-20 | Discharge: 2012-04-20 | Disposition: A | Discharge status: Home / Self Care | Payer: Medicare Other | Source: Ambulatory Visit | Attending: General Surgery | Admitting: General Surgery

## 2012-04-20 DIAGNOSIS — K7689 Other specified diseases of liver: Secondary | ICD-10-CM | POA: Insufficient documentation

## 2012-04-20 DIAGNOSIS — R58 Hemorrhage, not elsewhere classified: Secondary | ICD-10-CM | POA: Insufficient documentation

## 2012-04-21 ENCOUNTER — Telehealth (INDEPENDENT_AMBULATORY_CARE_PROVIDER_SITE_OTHER): Payer: Self-pay | Admitting: General Surgery

## 2012-04-21 NOTE — Telephone Encounter (Signed)
Called pt and informed her that we had received her [place] of abd and that the results showed that she has a hematoma that is resolving but not gone and that she has no restrictions but we would like to see her back in about 3 months.

## 2012-06-03 ENCOUNTER — Encounter: Payer: Self-pay | Admitting: Gastroenterology

## 2012-07-19 ENCOUNTER — Ambulatory Visit (INDEPENDENT_AMBULATORY_CARE_PROVIDER_SITE_OTHER): Payer: Medicare Other | Admitting: General Surgery

## 2012-07-19 ENCOUNTER — Encounter (INDEPENDENT_AMBULATORY_CARE_PROVIDER_SITE_OTHER): Payer: Self-pay | Admitting: General Surgery

## 2012-07-19 VITALS — BP 142/88 | HR 72 | Temp 97.8°F | Resp 16 | Ht 61.0 in | Wt 139.4 lb

## 2012-07-19 DIAGNOSIS — R58 Hemorrhage, not elsewhere classified: Secondary | ICD-10-CM

## 2012-07-19 NOTE — Patient Instructions (Signed)
Ok to return to normal activities Follow up with medical doc

## 2012-07-26 ENCOUNTER — Encounter (INDEPENDENT_AMBULATORY_CARE_PROVIDER_SITE_OTHER): Payer: Self-pay | Admitting: General Surgery

## 2012-07-26 NOTE — Progress Notes (Signed)
Subjective:     Patient ID: Carrie [name], female   DOB: 09-24-1940, 71 y.o.   MRN: [id number]  HPI The patient is a 71 year old black female who is about 3 months status post spontaneous hemorrhage from her liver. She was treated with interventional radiology embolization. She gradually improved and did not require surgery. She has no complaints today. Her appetite is good and her bowels are working normally. She recently had a followup ultrasound which showed evidence of a resolving hematoma and the liver.  Review of Systems  Constitutional: Negative.   HENT: Negative.   Eyes: Negative.   Respiratory: Negative.   Cardiovascular: Negative.   Gastrointestinal: Negative.   Genitourinary: Negative.   Musculoskeletal: Negative.   Skin: Negative.   Neurological: Negative.   Hematological: Negative.   Psychiatric/Behavioral: Negative.        Objective:   Physical Exam  Constitutional: She is oriented to person, place, and time. She appears well-developed and well-nourished.  HENT:  Head: Normocephalic and atraumatic.  Eyes: Conjunctivae normal and EOM are normal. Pupils are equal, round, and reactive to light.  Neck: Normal range of motion. Neck supple.  Cardiovascular: Normal rate, regular rhythm and normal heart sounds.   Pulmonary/Chest: Effort normal and breath sounds normal.  Abdominal: Soft. Bowel sounds are normal.  Musculoskeletal: Normal range of motion.  Neurological: She is alert and oriented to person, place, and time.  Skin: Skin is warm and dry.  Psychiatric: She has a normal mood and affect. Her behavior is normal.       Assessment:     3 months status post spontaneous hemorrhage from the liver that is resolving now    Plan:     Overall she looks great. I think she can return to her normal activities without any restrictions. The recommendation from radiology is for another followup ultrasound in about 3 months. I will turn her care back over to her medical  doctors for this. We will plan to see her back on a when necessary basis

## 2012-12-01 ENCOUNTER — Ambulatory Visit (HOSPITAL_BASED_OUTPATIENT_CLINIC_OR_DEPARTMENT_OTHER): Payer: Medicare Other | Admitting: Lab

## 2012-12-01 ENCOUNTER — Ambulatory Visit (HOSPITAL_BASED_OUTPATIENT_CLINIC_OR_DEPARTMENT_OTHER): Payer: Medicare Other | Admitting: Oncology

## 2012-12-01 ENCOUNTER — Encounter: Payer: Self-pay | Admitting: Oncology

## 2012-12-01 ENCOUNTER — Other Ambulatory Visit: Payer: Medicare Other | Admitting: Lab

## 2012-12-01 ENCOUNTER — Telehealth: Payer: Self-pay | Admitting: Oncology

## 2012-12-01 VITALS — BP 159/89 | HR 87 | Temp 98.3°F | Resp 20 | Ht 61.0 in | Wt 145.8 lb

## 2012-12-01 DIAGNOSIS — E8809 Other disorders of plasma-protein metabolism, not elsewhere classified: Secondary | ICD-10-CM

## 2012-12-01 LAB — CBC WITH DIFFERENTIAL/PLATELET
EOS%: 2.7 % (ref 0.0–7.0)
Eosinophils Absolute: 0.2 10*3/uL (ref 0.0–0.5)
MCH: 31.2 pg (ref 25.1–34.0)
MCV: 92.3 fL (ref 79.5–101.0)
MONO%: 12.3 % (ref 0.0–14.0)
NEUT#: 4.7 10*3/uL (ref 1.5–6.5)
RBC: 4.43 10*6/uL (ref 3.70–5.45)
RDW: 15.5 % — ABNORMAL HIGH (ref 11.2–14.5)
lymph#: 1.7 10*3/uL (ref 0.9–3.3)

## 2012-12-01 NOTE — Telephone Encounter (Signed)
Pt sent back to lb and given appt schedule for March 2015.

## 2012-12-01 NOTE — Patient Instructions (Addendum)
Doing well  I will continue to see you every 12 months

## 2012-12-01 NOTE — Progress Notes (Signed)
OFFICE PROGRESS NOTE  CC  Geraldo Pitter, MD 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 female returns for followup visit today. She has been seen by me on a yearly basis. No recent hospitalizations. She now is doing well. She otherwise denies any fevers chills night sweats headaches currently she has no shortness of breath no chest pains palpitations no myalgias or arthralgias no bleeding problems. Remainder of the 10 point review of systems is negative.  MEDICAL HISTORY: Past Medical History  Diagnosis Date  . Hypertension   . Atrial fibrillation     normal coroonaries cath 2004. Dr Clarene [facility]   . Sick sinus syndrome     Dr Sharrell Ku. EP study negative for inducibel arrythmia. ? pacer since 2007  . Hyperlipidemia   . Arthritis   . Anxiety   . Diverticula of colon   . Pneumonia 2009    resolved.? OPD Rx  . Hemorrhage intraabdominal 03/17/2012    ALLERGIES:  is allergic to codeine.  MEDICATIONS:  Current Outpatient Prescriptions  Medication Sig Dispense Refill  . Dextromethorphan-Guaifenesin (MUCINEX DM PO) Take by mouth as needed.      . diltiazem (CARDIZEM CD) 120 MG 24 hr capsule Take 1 capsule (120 mg total) by mouth daily.  30 capsule  0  . metoprolol (LOPRESSOR) 50 MG tablet Take 50 mg by mouth 2 (two) times daily.      Marland Kitchen warfarin (COUMADIN) 5 MG tablet Take 5 mg by mouth one time only at 6 PM. Takes 5 mg on Tuesday and Friday, takes 2.5 mg Monday, Wednesday, Thursday, Saturday, and Sunday.      Marland Kitchen acetaminophen (TYLENOL) 325 MG tablet Take 2 tablets (650 mg total) by mouth every 4 (four) hours as needed.       No current facility-administered medications for this visit.    SURGICAL HISTORY:  Past Surgical History   Procedure Laterality Date  . Pacemaker insertion    . Mitral valve replacement  1998    St Jude  . Appendectomy    . Tubal ligation    . Tee without cardioversion  09/23/2011    Procedure: TRANSESOPHAGEAL ECHOCARDIOGRAM (TEE);  Surgeon: Chrystie Nose;  Location: [facility] ENDOSCOPY;  Service: Cardiovascular;  Laterality: N/A;  . Cardiac valve replacement    . Cardiac catheterization    . Insert / replace / remove pacemaker      REVIEW OF SYSTEMS:  Pertinent items are noted in HPI.   PHYSICAL EXAMINATION: Resp: clear to auscultation bilaterally and normal percussion bilaterally Cardio: irregularly irregular rhythm GI: soft, non-tender; bowel sounds normal; no masses,  no organomegaly Extremities: extremities normal, atraumatic, no cyanosis or edema Neurologic: Alert and oriented X 3, normal strength and tone. Normal symmetric reflexes. Normal coordination and gait  ECOG PERFORMANCE STATUS: 1 - Symptomatic but completely ambulatory  Blood pressure 159/89, pulse 87, temperature 98.3 F (36.8 C), temperature source Oral, resp. rate 20, height 5\' 1"  (1.549 m), weight 145 lb 12.8 oz (66.134 kg).  LABORATORY DATA: Lab Results  Component Value Date   WBC 10.7* 03/29/2012   HGB 10.2* 03/29/2012   HCT 31.2* 03/29/2012   MCV 90.7 03/29/2012   PLT 506* 03/29/2012      Chemistry      Component Value Date/Time   NA 135 03/24/2012 0518  NA 138 03/14/2010 1318   K 4.1 03/24/2012 0518   K 4.1 03/14/2010 1318   CL 106 03/24/2012 0518   CL 100 03/14/2010 1318   CO2 22 03/24/2012 0518   CO2 27 03/14/2010 1318   BUN 9 03/24/2012 0518   BUN 14 03/14/2010 1318   CREATININE 1.33* 03/24/2012 0518   CREATININE 0.63 10/28/2011 1453      Component Value Date/Time   CALCIUM 9.0 03/24/2012 0518   CALCIUM 9.9 03/14/2010 1318   ALKPHOS 192* 03/24/2012 0518   ALKPHOS 125* 03/14/2010 1318   AST 326* 03/24/2012 0518   AST 46* 03/14/2010 1318   ALT 116* 03/24/2012 0518   BILITOT 2.2* 03/24/2012 0518   BILITOT 0.70 03/14/2010 1318        RADIOGRAPHIC STUDIES:  No results found.  ASSESSMENT: 72 year old female with  #1 polyclonal gammopathy.  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     PLAN:   #1 hematological patient seems to be doing well her blood count is reviewed today he looks pretty stable she has no evidence of overt hemolysis going on at this point.  #2 I have recommended that patients continue to follow [place] on a yearly basis. Of course I can also see her on an as needed basis.     All questions were answered. The patient knows to call the clinic with any problems, questions or concerns. We can certainly see the patient much sooner if necessary.  I spent 15 minutes counseling the patient face to face. The total time spent in the appointment was 30 minutes.    Drue Second, MD Medical/Oncology [facility] [phone] (beeper) [phone] (Office)  12/01/2012, 12:13 PM

## 2012-12-05 ENCOUNTER — Ambulatory Visit: Payer: Self-pay | Admitting: Cardiovascular Disease

## 2012-12-28 ENCOUNTER — Encounter: Payer: Self-pay | Admitting: *Deleted

## 2012-12-29 ENCOUNTER — Encounter: Payer: Self-pay | Admitting: Cardiovascular Disease

## 2012-12-29 ENCOUNTER — Encounter: Payer: Self-pay | Admitting: *Deleted

## 2013-02-01 ENCOUNTER — Other Ambulatory Visit: Payer: Self-pay | Admitting: *Deleted

## 2013-02-01 MED ORDER — METOPROLOL TARTRATE 50 MG PO TABS: 50.0000 mg | ORAL_TABLET | Freq: Two times a day (BID) | ORAL | Status: DC

## 2013-02-02 ENCOUNTER — Other Ambulatory Visit: Payer: Self-pay | Admitting: Pharmacist

## 2013-02-02 MED ORDER — METOPROLOL TARTRATE 50 MG PO TABS: 50.0000 mg | ORAL_TABLET | Freq: Two times a day (BID) | ORAL | Status: DC

## 2013-02-15 ENCOUNTER — Ambulatory Visit (INDEPENDENT_AMBULATORY_CARE_PROVIDER_SITE_OTHER): Payer: Medicare Other | Admitting: Pharmacist Clinician (PhC)/ Clinical Pharmacy Specialist

## 2013-02-15 ENCOUNTER — Ambulatory Visit: Payer: Medicare Other | Admitting: Pharmacist Clinician (PhC)/ Clinical Pharmacy Specialist

## 2013-02-15 VITALS — BP 140/80 | HR 80

## 2013-02-15 DIAGNOSIS — Z952 Presence of prosthetic heart valve: Secondary | ICD-10-CM

## 2013-02-15 DIAGNOSIS — Z7901 Long term (current) use of anticoagulants: Secondary | ICD-10-CM

## 2013-02-15 DIAGNOSIS — I482 Chronic atrial fibrillation, unspecified: Secondary | ICD-10-CM

## 2013-02-15 DIAGNOSIS — I4891 Unspecified atrial fibrillation: Secondary | ICD-10-CM

## 2013-02-15 DIAGNOSIS — Z954 Presence of other heart-valve replacement: Secondary | ICD-10-CM

## 2013-03-15 ENCOUNTER — Inpatient Hospital Stay (HOSPITAL_COMMUNITY): Payer: Medicare Other

## 2013-03-15 ENCOUNTER — Emergency Department (HOSPITAL_COMMUNITY): Payer: Medicare Other

## 2013-03-15 ENCOUNTER — Inpatient Hospital Stay (HOSPITAL_COMMUNITY)
Admission: EM | Admit: 2013-03-15 | Discharge: 2013-03-22 | DRG: 310 | Disposition: A | Discharge status: Home / Self Care | Payer: Medicare Other | Attending: Internal Medicine | Admitting: Internal Medicine

## 2013-03-15 ENCOUNTER — Encounter (HOSPITAL_COMMUNITY): Payer: Self-pay | Admitting: Emergency Medicine

## 2013-03-15 DIAGNOSIS — Z0389 Encounter for observation for other suspected diseases and conditions ruled out: Secondary | ICD-10-CM

## 2013-03-15 DIAGNOSIS — Z7901 Long term (current) use of anticoagulants: Secondary | ICD-10-CM

## 2013-03-15 DIAGNOSIS — Z952 Presence of prosthetic heart valve: Secondary | ICD-10-CM

## 2013-03-15 DIAGNOSIS — E785 Hyperlipidemia, unspecified: Secondary | ICD-10-CM | POA: Diagnosis present

## 2013-03-15 DIAGNOSIS — M129 Arthropathy, unspecified: Secondary | ICD-10-CM | POA: Diagnosis present

## 2013-03-15 DIAGNOSIS — Z950 Presence of cardiac pacemaker: Secondary | ICD-10-CM | POA: Diagnosis present

## 2013-03-15 DIAGNOSIS — R112 Nausea with vomiting, unspecified: Secondary | ICD-10-CM | POA: Diagnosis present

## 2013-03-15 DIAGNOSIS — I10 Essential (primary) hypertension: Secondary | ICD-10-CM | POA: Diagnosis present

## 2013-03-15 DIAGNOSIS — Z954 Presence of other heart-valve replacement: Secondary | ICD-10-CM

## 2013-03-15 DIAGNOSIS — I609 Nontraumatic subarachnoid hemorrhage, unspecified: Secondary | ICD-10-CM

## 2013-03-15 DIAGNOSIS — K7689 Other specified diseases of liver: Secondary | ICD-10-CM | POA: Diagnosis present

## 2013-03-15 DIAGNOSIS — I482 Chronic atrial fibrillation, unspecified: Secondary | ICD-10-CM

## 2013-03-15 DIAGNOSIS — I495 Sick sinus syndrome: Secondary | ICD-10-CM | POA: Diagnosis present

## 2013-03-15 DIAGNOSIS — I4891 Unspecified atrial fibrillation: Principal | ICD-10-CM

## 2013-03-15 DIAGNOSIS — F411 Generalized anxiety disorder: Secondary | ICD-10-CM | POA: Diagnosis present

## 2013-03-15 DIAGNOSIS — R58 Hemorrhage, not elsewhere classified: Secondary | ICD-10-CM | POA: Diagnosis present

## 2013-03-15 DIAGNOSIS — Z8673 Personal history of transient ischemic attack (TIA), and cerebral infarction without residual deficits: Secondary | ICD-10-CM

## 2013-03-15 DIAGNOSIS — K5730 Diverticulosis of large intestine without perforation or abscess without bleeding: Secondary | ICD-10-CM | POA: Diagnosis present

## 2013-03-15 HISTORY — DX: Migraine, unspecified, not intractable, without status migrainosus: G43.909

## 2013-03-15 HISTORY — DX: Presence of cardiac pacemaker: Z95.0

## 2013-03-15 HISTORY — DX: Gastro-esophageal reflux disease without esophagitis: K21.9

## 2013-03-15 HISTORY — DX: Personal history of other medical treatment: Z92.89

## 2013-03-15 HISTORY — DX: Heart failure, unspecified: I50.9

## 2013-03-15 HISTORY — DX: Shortness of breath: R06.02

## 2013-03-15 HISTORY — DX: Cardiac murmur, unspecified: R01.1

## 2013-03-15 HISTORY — DX: Cerebral infarction, unspecified: I63.9

## 2013-03-15 LAB — CBC WITH DIFFERENTIAL/PLATELET
Eosinophils Absolute: 0.1 10*3/uL (ref 0.0–0.7)
Hemoglobin: 13.4 g/dL (ref 12.0–15.0)
Lymphocytes Relative: 16 % (ref 12–46)
Lymphs Abs: 1.3 10*3/uL (ref 0.7–4.0)
Monocytes Relative: 5 % (ref 3–12)
Neutro Abs: 6.4 10*3/uL (ref 1.7–7.7)
Neutrophils Relative %: 78 % — ABNORMAL HIGH (ref 43–77)
Platelets: 189 10*3/uL (ref 150–400)
RBC: 4.35 MIL/uL (ref 3.87–5.11)
WBC: 8.2 10*3/uL (ref 4.0–10.5)

## 2013-03-15 LAB — HEMOGLOBIN A1C
Hgb A1c MFr Bld: 5.5 % (ref <5.7)
Mean Plasma Glucose: 111 mg/dL (ref <117)

## 2013-03-15 LAB — TROPONIN I
Troponin I: <0.3 ng/mL (ref <0.30)
Troponin I: 0.48 ng/mL (ref <0.30)

## 2013-03-15 LAB — URINALYSIS, ROUTINE W REFLEX MICROSCOPIC
Hgb urine dipstick: NEGATIVE
Nitrite: NEGATIVE
Specific Gravity, Urine: 1.008 (ref 1.005–1.030)
Urobilinogen, UA: 0.2 mg/dL (ref 0.0–1.0)
pH: 7.5 (ref 5.0–8.0)

## 2013-03-15 LAB — BASIC METABOLIC PANEL
BUN: 13 mg/dL (ref 6–23)
Chloride: 103 mEq/L (ref 96–112)
GFR calc non Af Amer: 82 mL/min — ABNORMAL LOW (ref >90)
Glucose, Bld: 116 mg/dL — ABNORMAL HIGH (ref 70–99)
Potassium: 3.8 mEq/L (ref 3.5–5.1)
Sodium: 140 mEq/L (ref 135–145)

## 2013-03-15 LAB — HEPATIC FUNCTION PANEL
ALT: 28 U/L (ref 0–35)
AST: 57 U/L — ABNORMAL HIGH (ref 0–37)
Bilirubin, Direct: 0.1 mg/dL (ref 0.0–0.3)
Indirect Bilirubin: 0.5 mg/dL (ref 0.3–0.9)
Total Bilirubin: 0.6 mg/dL (ref 0.3–1.2)

## 2013-03-15 LAB — PROTIME-INR
INR: 1.78 — ABNORMAL HIGH (ref 0.00–1.49)
Prothrombin Time: 20.1 seconds — ABNORMAL HIGH (ref 11.6–15.2)

## 2013-03-15 LAB — POCT I-STAT TROPONIN I
Troponin i, poc: 0.2 ng/mL (ref 0.00–0.08)
Troponin i, poc: 0.17 ng/mL (ref 0.00–0.08)

## 2013-03-15 LAB — TSH: TSH: 0.637 u[IU]/mL (ref 0.350–4.500)

## 2013-03-15 LAB — LIPASE, BLOOD: Lipase: 51 U/L (ref 11–59)

## 2013-03-15 MED ORDER — ONDANSETRON HCL 4 MG/2ML IJ SOLN: 4.0000 mg | Freq: Four times a day (QID) | INTRAMUSCULAR | Status: DC | PRN

## 2013-03-15 MED ORDER — SODIUM CHLORIDE 0.9 % IJ SOLN: 3.0000 mL | INTRAMUSCULAR | Status: DC | PRN

## 2013-03-15 MED ORDER — ACETAMINOPHEN 650 MG RE SUPP: 650.0000 mg | Freq: Four times a day (QID) | RECTAL | Status: DC | PRN

## 2013-03-15 MED ORDER — SODIUM CHLORIDE 0.9 % IV SOLN: 250.0000 mL | INTRAVENOUS | Status: DC | PRN

## 2013-03-15 MED ORDER — ONDANSETRON HCL 4 MG PO TABS: 4.0000 mg | ORAL_TABLET | Freq: Four times a day (QID) | ORAL | Status: DC | PRN

## 2013-03-15 MED ORDER — SODIUM CHLORIDE 0.9 % IJ SOLN
3.0000 mL | Freq: Two times a day (BID) | INTRAMUSCULAR | Status: DC
  Administered 2013-03-20: 3 mL via INTRAVENOUS

## 2013-03-15 MED ORDER — DILTIAZEM HCL ER COATED BEADS 120 MG PO CP24
120.0000 mg | ORAL_CAPSULE | Freq: Every day | ORAL | Status: DC
  Administered 2013-03-15 – 2013-03-22 (×8): 120 mg via ORAL
  Filled 2013-03-15 (×8): qty 1

## 2013-03-15 MED ORDER — HEPARIN (PORCINE) IN NACL 100-0.45 UNIT/ML-% IJ SOLN
950.0000 [IU]/h | INTRAMUSCULAR | Status: DC
  Administered 2013-03-15: 800 [IU]/h via INTRAVENOUS
  Filled 2013-03-15 (×3): qty 250

## 2013-03-15 MED ORDER — ONDANSETRON HCL 4 MG/2ML IJ SOLN
4.0000 mg | Freq: Once | INTRAMUSCULAR | Status: AC
  Administered 2013-03-15: 4 mg via INTRAVENOUS
  Filled 2013-03-15: qty 2

## 2013-03-15 MED ORDER — ONDANSETRON HCL 4 MG/2ML IJ SOLN: 4.0000 mg | Freq: Three times a day (TID) | INTRAMUSCULAR | Status: AC | PRN

## 2013-03-15 MED ORDER — METOPROLOL TARTRATE 50 MG PO TABS
50.0000 mg | ORAL_TABLET | Freq: Two times a day (BID) | ORAL | Status: DC
  Administered 2013-03-15 – 2013-03-22 (×14): 50 mg via ORAL
  Filled 2013-03-15 (×15): qty 1

## 2013-03-15 MED ORDER — SODIUM CHLORIDE 0.9 % IJ SOLN
3.0000 mL | Freq: Two times a day (BID) | INTRAMUSCULAR | Status: DC
  Administered 2013-03-18 – 2013-03-19 (×2): 3 mL via INTRAVENOUS

## 2013-03-15 MED ORDER — LORAZEPAM 0.5 MG PO TABS: 0.5000 mg | ORAL_TABLET | Freq: Every evening | ORAL | Status: DC | PRN

## 2013-03-15 MED ORDER — SODIUM CHLORIDE 0.9 % IV BOLUS (SEPSIS)
500.0000 mL | Freq: Once | INTRAVENOUS | Status: AC
  Administered 2013-03-15: 500 mL via INTRAVENOUS

## 2013-03-15 MED ORDER — LEVALBUTEROL HCL 0.63 MG/3ML IN NEBU
0.6300 mg | INHALATION_SOLUTION | Freq: Four times a day (QID) | RESPIRATORY_TRACT | Status: DC | PRN
  Filled 2013-03-15: qty 3

## 2013-03-15 MED ORDER — ZOLPIDEM TARTRATE 5 MG PO TABS
5.0000 mg | ORAL_TABLET | Freq: Every evening | ORAL | Status: DC | PRN
  Administered 2013-03-20 – 2013-03-21 (×2): 5 mg via ORAL
  Filled 2013-03-15 (×2): qty 1

## 2013-03-15 MED ORDER — ACETAMINOPHEN 325 MG PO TABS
650.0000 mg | ORAL_TABLET | Freq: Four times a day (QID) | ORAL | Status: DC | PRN
  Administered 2013-03-19: 650 mg via ORAL
  Filled 2013-03-15: qty 2

## 2013-03-15 NOTE — Progress Notes (Signed)
ANTICOAGULATION CONSULT NOTE - Follow Up Consult  Pharmacy Consult for : Heparin Indication:  chest pain/ACS  Dosing Weight: 61 kg  Labs:  Recent Labs  03/15/13 1800  HEPARINUNFRC 0.30   Infusions:  . heparin 800 Units/hr (03/15/13 1055)   Assessment:  8 hour Heparin level is at low end of therapeutic range. No bleeding complications noted   Goal of Therapy:  Heparin level 0.3-0.7 units/ml   Plan:  Increase Heparin infusion to 950 units/hr.  Daily Heparin level and CBC while on Heparin.    Carrie [name], Deetta Perla.D 03/15/2013, 9:32 PM

## 2013-03-15 NOTE — ED Notes (Signed)
Lactic acid results called to Diplomatic Services operational officer for Pod A, nurse not available

## 2013-03-15 NOTE — ED Notes (Signed)
Results of i-stat troponin given to Dr. Norlene Campbell

## 2013-03-15 NOTE — ED Notes (Signed)
Pt back from CT scan and ultra sound.Pt transferred to 2000.

## 2013-03-15 NOTE — Progress Notes (Signed)
Unit CM UR Completed by [facility] ED CM  W. Donalda Job RN  

## 2013-03-15 NOTE — ED Notes (Signed)
Pt brought to ED with nausea and vomiting.

## 2013-03-15 NOTE — ED Provider Notes (Signed)
History    CSN: [id number] Arrival date & time 03/15/13  0745  First MD Initiated Contact with Patient 03/15/13 [phone]     No chief complaint on file.  (Consider location/radiation/quality/duration/timing/severity/associated sxs/prior Treatment) The history is provided by the patient and medical records.   Patient with PMH significant for HTN, HLP, anxiety, hepatic hemorrhage, presents to the ED for nausea or vomiting. Patient states she woke this morning and upon standing got very lightheaded with nausea and vomiting.  3 episodes of non-bloody, non-bilious vomiting PTA.  No recent changes in diet or initiation of new medications. BM normal and non-bloody. No sick contacts.  No abdominal pain,urinary sx, fevers, sweats, or chills.  Denies any chest pain, SOB, weakness, numbness/paresthesias of extremities.  Pt has medtronic pacemaker secondary to AFib and sick sinus syndrome, and mechanical mitral valve.  Pt on coumadin. PCP- Parke Simmers Cardiologist- Croitoru  Past Medical History  Diagnosis Date  . Hypertension   . Atrial fibrillation     normal coronaries cath 2004. Dr Clarene [facility]   . Sick sinus syndrome     Dr Sharrell Ku. EP study negative. Pacemaker 12/15/06 Medtronic  . Hyperlipidemia   . Arthritis   . Anxiety   . Diverticula of colon   . Pneumonia 2009    resolved.? OPD Rx  . Hemorrhage intraabdominal 03/17/2012  . 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 Jude mechanical prosthesis. LA severe dilated  . 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 Tyrone Sage  . Presence of permanent cardiac pacemaker 12/15/06    Medtronic  . Subdural hematoma   . Liver hemorrhage   . Atrial flutter    Past Surgical History  Procedure Laterality Date  . Pacemaker insertion  12/15/06    Medtronic  . Mitral valve replacement  1998    St Jude mechanical   . Appendectomy    . Tubal ligation    . Tee without cardioversion  09/23/2011    Procedure:  TRANSESOPHAGEAL ECHOCARDIOGRAM (TEE);  Surgeon: Chrystie Nose;  Location: [facility];  Service: Cardiovascular;  Laterality: N/A;  . Cardiac valve replacement    . Cardiac catheterization  04/02/97    R&L:severe MR/pulmonary hypertension  . Insert / replace / remove pacemaker    . US echocardiography  11/19/2011    EF 50-55%,RA mod to severely dilated,LA severely dilated,trace MR,small vegetation or mass on the MV,AOV mildly scleroticmild PI, RV pressure 40-5mmHg  . Persantine cardiolite  08/07/03    mild inf. ischemia    Family History  Problem Relation Age of Onset  . Cancer Mother   . Stroke Father   . Cancer Sister    History  Substance Use Topics  . Smoking status: Never Smoker   . Smokeless tobacco: Never Used  . Alcohol Use: No   OB History   Grav Para Term Preterm Abortions TAB SAB Ect Mult Living                 Review of Systems  Gastrointestinal: Positive for nausea and vomiting.  Neurological: Positive for light-headedness.  All other systems reviewed and are negative.    Allergies  Codeine  Home Medications   Current Outpatient Rx  Name  Route  Sig  Dispense  Refill  . acetaminophen (TYLENOL) 325 MG tablet   Oral   Take 2 tablets (650 mg total) by mouth every 4 (four) hours as needed.         Marland Kitchen  Dextromethorphan-Guaifenesin (MUCINEX DM PO)   Oral   Take by mouth as needed.         . diltiazem (CARDIZEM CD) 120 MG 24 hr capsule   Oral   Take 1 capsule (120 mg total) by mouth daily.   30 capsule   0   . folic acid (FOLVITE) 400 MCG tablet   Oral   Take 400 mcg by mouth every other day.         Marland Kitchen LORazepam (ATIVAN) 0.5 MG tablet   Oral   Take 0.5 mg by mouth at bedtime as needed for anxiety.         . metoprolol (LOPRESSOR) 50 MG tablet   Oral   Take 1 tablet (50 mg total) by mouth 2 (two) times daily.   60 tablet   6   . Multiple Vitamin (MULTIVITAMINS PO)   Oral   Take by mouth daily.         . polyethylene glycol  (MIRALAX / GLYCOLAX) packet   Oral   Take 17 g by mouth as needed.         . warfarin (COUMADIN) 5 MG tablet   Oral   Take 5 mg by mouth one time only at 6 PM. Takes 5 mg on Tuesday and Friday, takes 2.5 mg Monday, Wednesday, Thursday, Saturday, and Sunday.         . zolpidem (AMBIEN) 5 MG tablet   Oral   Take 5 mg by mouth at bedtime as needed for sleep.          There were no vitals taken for this visit. Physical Exam  Nursing note and vitals reviewed. Constitutional: She is oriented to person, place, and time. She appears well-developed and well-nourished.  HENT:  Head: Normocephalic and atraumatic.  Mouth/Throat: Uvula is midline, oropharynx is clear and moist and mucous membranes are normal. No oropharyngeal exudate.  Eyes: Conjunctivae and EOM are normal. Pupils are equal, round, and reactive to light.  Neck: Normal range of motion. Neck supple.  Cardiovascular: Normal rate, regular rhythm and normal heart sounds.   Pulmonary/Chest: Effort normal and breath sounds normal. No respiratory distress. She has no wheezes.  Abdominal: Soft. Bowel sounds are normal. There is no tenderness. There is no guarding, no CVA tenderness, no tenderness at McBurney's point and negative Murphy's sign.  Musculoskeletal: Normal range of motion. She exhibits no edema.  Neurological: She is alert and oriented to person, place, and time. She has normal strength. No cranial nerve deficit or sensory deficit.  CN grossly intact, moves all extremities appropriately, no acute neuro deficits or facial droop appreciated  Skin: Skin is warm and dry.  Psychiatric: She has a normal mood and affect.    ED Course  Procedures (including critical care time)   Date: 03/15/2013  Rate: 77  Rhythm: indeterminate  QRS Axis: normal  Intervals: indeterminate  ST/T Wave abnormalities: nonspecific T wave changes  Conduction Disutrbances:none  Narrative Interpretation: deep inverted t-waves of inferior and  lateral leads  Old EKG Reviewed: unchanged   Labs Reviewed  CBC WITH DIFFERENTIAL - Abnormal; Notable for the following:    Neutrophils Relative % 78 (*)    All other components within normal limits  BASIC METABOLIC PANEL - Abnormal; Notable for the following:    Glucose, Bld 116 (*)    GFR calc non Af Amer 82 (*)    All other components within normal limits  PROTIME-INR - Abnormal; Notable for the following:  Prothrombin Time 20.1 (*)    INR 1.78 (*)    All other components within normal limits  CG4 I-STAT (LACTIC ACID) - Abnormal; Notable for the following:    Lactic Acid, Venous 2.77 (*)    All other components within normal limits  POCT I-STAT TROPONIN I - Abnormal; Notable for the following:    Troponin i, poc 0.20 (*)    All other components within normal limits  LIPASE, BLOOD  URINALYSIS, ROUTINE W REFLEX MICROSCOPIC  HEPARIN LEVEL (UNFRACTIONATED)   Dg Chest 2 View  03/15/2013   *RADIOLOGY REPORT*  Clinical Data: Weakness with emesis.  History of hypertension.  CHEST - 2 VIEW  Comparison: 03/22/2012 and 09/29/2011.  Findings: The left subclavian pacemaker is unchanged.  There is stable cardiomegaly status post median sternotomy and mitral valve replacement.  Overall basilar aeration has improved.  There is mild chronic vascular congestion.  No pleural effusion or pneumothorax is seen.  IMPRESSION: Overall improved aeration of the lung bases.  Stable cardiomegaly.   Original Report Authenticated By: Carey Bullocks, M.D.   1. Normal coronary arteries   2. Chronic anticoagulation   3. Chronic atrial fibrillation   4. Presence of permanent cardiac pacemaker   5. S/P mitral valve replacement   6. Elevated troponin   7. Nausea & vomiting   8. H/O mitral valve replacement with mechanical valve   9. Pacemaker   10. HTN (hypertension)   11. Hyperlipidemia    CRITICAL CARE Performed by: Garlon Hatchet   Total critical care time: 30  Critical care time was exclusive of  separately billable procedures and treating other patients.  Critical care was necessary to treat or prevent imminent or life-threatening deterioration.  Critical care was time spent personally by me on the following activities: development of treatment plan with patient and/or surrogate as well as nursing, discussions with consultants, evaluation of patient's response to treatment, examination of patient, obtaining history from patient or surrogate, ordering and performing treatments and interventions, ordering and review of laboratory studies, ordering and review of radiographic studies, pulse oximetry and re-evaluation of patient's condition.  Medications  heparin ADULT infusion 100 units/mL (25000 units/250 mL) (800 Units/hr Intravenous New Bag/Given 03/15/13 1055)  ondansetron (ZOFRAN) injection 4 mg (not administered)  sodium chloride 0.9 % injection 3 mL (not administered)  sodium chloride 0.9 % injection 3 mL (not administered)  sodium chloride 0.9 % injection 3 mL (not administered)  0.9 %  sodium chloride infusion (not administered)  acetaminophen (TYLENOL) tablet 650 mg (not administered)    Or  acetaminophen (TYLENOL) suppository 650 mg (not administered)  ondansetron (ZOFRAN) tablet 4 mg (not administered)    Or  ondansetron (ZOFRAN) injection 4 mg (not administered)  levalbuterol (XOPENEX) nebulizer solution 0.63 mg (not administered)  sodium chloride 0.9 % bolus 500 mL (500 mLs Intravenous New Bag/Given 03/15/13 0927)  ondansetron (ZOFRAN) injection 4 mg (4 mg Intravenous Given 03/15/13 0931)     MDM   EKG NSR, deep inverted t-waves in inferior and lateral leads unchanged from previous on 12/29/12.  Positive trop at 0.2,  3hr repeat trop improved to 0.17.  Coumadin subtherapeutic- Heparin initiated. U/a without signs of infection.  Lactic acid elevated at 2.77, no leukocytosis.  Nausea controlled with IVF and zofran.  Pt continues to be without cardiac sx.  Consulted [facility] and  evaluated by Dr. Allyson Sabal in the ED- advised to admit to hospitalist for cycled cardiac enzymes.  Consulted hospitalist, Dr. Susie Cassette- pt will be admitted  to [facility], telemetry.  Temp admit orders placed.  VS stable for transfer to floor.    Garlon Hatchet, PA-C 03/15/13 1734

## 2013-03-15 NOTE — H&P (Signed)
[facility] History and Physical  Carrie [name] NFA:[id number] DOB: 27-Mar-1941 DOA: 03/15/2013  Referring physician:   PCP: Geraldo Pitter, MD   Chief Complaint:   HPI:  72 year old female with a history of permanent atrial fibrillation, hypertension, mitral valve placement with a St. Jude mechanical valve July 1998 (on Coumadin with INR target of 2.0 2.5 secondary to previous subdural hematoma and liver hemorrhage), hyperlipidemia, sick sinus syndrome (single-chamber Medtronic pacemaker placed March 2008), presents to the ED for nausea or vomiting.Troponin is mildly elevated at 0.2. Lactic acid 2.77.  Patient states she woke this morning and upon standing got very lightheaded with nausea and vomiting. 3 episodes of non-bloody, non-bilious vomiting PTA. No recent changes in diet or initiation of new medications. BM normal and non-bloody. No sick contacts. Denies any abdominal pain,urinary sx, fevers, sweats, or chills. Denies any chest pain, SOB, weakness, numbness/paresthesias of extremities. Pt has medtronic pacemaker secondary to AFib and sick sinus syndrome, and mechanical mitral valve. Pt on coumadin.       Review of Systems: negative for the following  Constitutional: Denies fever, chills, diaphoresis, appetite change and fatigue.  HEENT: Denies photophobia, eye pain, redness, hearing loss, ear pain, congestion, sore throat, rhinorrhea, sneezing, mouth sores, trouble swallowing, neck pain, neck stiffness and tinnitus.  Respiratory: Denies SOB, DOE, cough, chest tightness, and wheezing.  Cardiovascular: Denies chest pain, palpitations and leg swelling.  Gastrointestinal: Denies nausea, vomiting, abdominal pain, diarrhea, constipation, blood in stool and abdominal distention.  Genitourinary: Denies dysuria, urgency, frequency, hematuria, flank pain and difficulty urinating.  Musculoskeletal: Denies myalgias, back pain, joint swelling, arthralgias and gait problem.  Skin: Denies  pallor, rash and wound.  Neurological: Denies dizziness, seizures, syncope, weakness, light-headedness, numbness and headaches.  Hematological: Denies adenopathy. Easy bruising, personal or family bleeding history  Psychiatric/Behavioral: Denies suicidal ideation, mood changes, confusion, nervousness, sleep disturbance and agitation       Past Medical History  Diagnosis Date  . Hypertension   . Atrial fibrillation     normal coronaries cath 2004. Dr Clarene [facility]   . Sick sinus syndrome     Dr Sharrell Ku. EP study negative. Pacemaker 12/15/06 Medtronic  . Hyperlipidemia   . Arthritis   . Anxiety   . Diverticula of colon   . Pneumonia 2009    resolved.? OPD Rx  . Hemorrhage intraabdominal 03/17/2012  . 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 Jude mechanical prosthesis. LA severe dilated  . 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 Tyrone Sage  . Presence of permanent cardiac pacemaker 12/15/06    Medtronic  . Subdural hematoma   . Liver hemorrhage   . Atrial flutter      Past Surgical History  Procedure Laterality Date  . Pacemaker insertion  12/15/06    Medtronic  . Mitral valve replacement  1998    St Jude mechanical   . Appendectomy    . Tubal ligation    . Tee without cardioversion  09/23/2011    Procedure: TRANSESOPHAGEAL ECHOCARDIOGRAM (TEE);  Surgeon: Chrystie Nose;  Location: [facility];  Service: Cardiovascular;  Laterality: N/A;  . Cardiac valve replacement    . Cardiac catheterization  04/02/97    R&L:severe MR/pulmonary hypertension  . Insert / replace / remove pacemaker    . US echocardiography  11/19/2011    EF 50-55%,RA mod to severely dilated,LA severely dilated,trace MR,small vegetation or mass on the MV,AOV mildly scleroticmild PI, RV pressure 40-37mmHg  .  Persantine cardiolite  08/07/03    mild inf. ischemia       Social History:  reports that she has never smoked. She has never used smokeless  tobacco. She reports that she does not drink alcohol or use illicit drugs.    Allergies  Allergen Reactions  . Codeine Nausea And Vomiting    Family History  Problem Relation Age of Onset  . Cancer Mother   . Stroke Father   . Cancer Sister      Prior to Admission medications   Medication Sig Start Date End Date Taking? Authorizing Provider  folic acid (FOLVITE) 400 MCG tablet Take 400 mcg by mouth every other day.   Yes Historical Provider, MD  metoprolol (LOPRESSOR) 50 MG tablet Take 1 tablet (50 mg total) by mouth 2 (two) times daily. 02/02/13  Yes Mihai Croitoru, MD  Multiple Vitamin (MULTIVITAMINS PO) Take by mouth daily.   Yes Historical Provider, MD  OVER THE COUNTER MEDICATION Place 1 drop into both eyes 2 (two) times daily. Drops for dry eyes   Yes Historical Provider, MD  polyethylene glycol (MIRALAX / GLYCOLAX) packet Take 17 g by mouth as needed (for constipation).    Yes Historical Provider, MD  warfarin (COUMADIN) 5 MG tablet Take 2.5-5 mg by mouth daily at 6 PM. Takes 2.5mg  on Sunday and Wednesday, 5 mg on Monday, Tuesday, Thursday, Friday, Saturday 03/29/12 03/29/13 Yes Sherrie George, PA-C     Physical Exam: Filed Vitals:   03/15/13 1315 03/15/13 1330 03/15/13 1400 03/15/13 1430  BP:  162/79 156/77 154/88  Pulse: 78 78 64 76  Temp:      TempSrc:      Resp: 15 22 16 13   SpO2: 96% 97% 96% 98%     Constitutional: Vital signs reviewed. Patient is a well-developed and well-nourished in no acute distress and cooperative with exam. Alert and oriented x3.  Head: Normocephalic and atraumatic  Ear: TM normal bilaterally  Mouth: no erythema or exudates, MMM  Eyes: PERRL, EOMI, conjunctivae normal, No scleral icterus.  Neck: Supple, Trachea midline normal ROM, No JVD, mass, thyromegaly, or carotid bruit present.  Cardiovascular: RRR, S1 normal, S2 normal, no MRG, pulses symmetric and intact bilaterally  Pulmonary/Chest: CTAB, no wheezes, rales, or rhonchi  Abdominal:  Soft. Non-tender, non-distended, bowel sounds are normal, no masses, organomegaly, or guarding present.  GU: no CVA tenderness Musculoskeletal: No joint deformities, erythema, or stiffness, ROM full and no nontender Ext: no edema and no cyanosis, pulses palpable bilaterally (DP and PT)  Hematology: no cervical, inginal, or axillary adenopathy.  Neurological: A&O x3, Strenght is normal and symmetric bilaterally, cranial nerve II-XII are grossly intact, no focal motor deficit, sensory intact to light touch bilaterally.  Skin: Warm, dry and intact. No rash, cyanosis, or clubbing.  Psychiatric: Normal mood and affect. speech and behavior is normal. Judgment and thought content normal. Cognition and memory are normal.       Labs on Admission:    Basic Metabolic Panel:  Recent Labs Lab 03/15/13 0825  NA 140  K 3.8  CL 103  CO2 26  GLUCOSE 116*  BUN 13  CREATININE 0.79  CALCIUM 9.7   Liver Function Tests: No results found for this basename: AST, ALT, ALKPHOS, BILITOT, PROT, ALBUMIN,  in the last 168 hours  Recent Labs Lab 03/15/13 0825  LIPASE 51   No results found for this basename: AMMONIA,  in the last 168 hours CBC:  Recent Labs Lab 03/15/13 0825  WBC  8.2  NEUTROABS 6.4  HGB 13.4  HCT 38.8  MCV 89.2  PLT 189   Cardiac Enzymes: No results found for this basename: CKTOTAL, CKMB, CKMBINDEX, TROPONINI,  in the last 168 hours  BNP (last 3 results) No results found for this basename: PROBNP,  in the last 8760 hours    CBG: No results found for this basename: GLUCAP,  in the last 168 hours  Radiological Exams on Admission: Dg Chest 2 View  03/15/2013   *RADIOLOGY REPORT*  Clinical Data: Weakness with emesis.  History of hypertension.  CHEST - 2 VIEW  Comparison: 03/22/2012 and 09/29/2011.  Findings: The left subclavian pacemaker is unchanged.  There is stable cardiomegaly status post median sternotomy and mitral valve replacement.  Overall basilar aeration has  improved.  There is mild chronic vascular congestion.  No pleural effusion or pneumothorax is seen.  IMPRESSION: Overall improved aeration of the lung bases.  Stable cardiomegaly.   Original Report Authenticated By: Carey Bullocks, M.D.    EKG: Independently reviewed.  Assessment/Plan Active Problems:   S/P mitral valve replacement, St Jude   Presence of permanent cardiac pacemaker   Chronic anticoagulation, ( INR goal 2.0-2.5 due to history of subdural hematoma and liver hemorrhage)   Chronic atrial fibrillation   Hemorrhage, hepatic, Rx'd with Coumadin reversal and embolisation 03/17/12   SAH (subarachnoid hemorrhage), December 2012   Nausea vomiting Liver function tests normal with the exception of alkaline phosphatase which is mildly elevated Lipase within normal limits Abdominal ultrasound negative Could be related to cardiac etiology? Cardiology is already seen the patient Subtle EKG changes, but doubt unstable angina  Atrial fibrillation Rate controlled Subtherapeutic INR, currently on heparin drip per cardiology recommendations Continue metoprolol 50 mg twice a day and Cardizem 120 mg by mouth daily Has been concerned about hypertension at home  Hypertension Will need adjustment during this hospitalization 2-D echo pending Will defer adjusting these medications to cardiology   History of subarachnoid hemorrhage CT head is negative   Code Status:   full Family Communication: bedside Disposition Plan: Admit to telemetry, likely discharge home tomorrow  Time spent: 70 mins   [facility] Pager [phone]  If 7PM-7AM, please contact night-coverage [email or web address] Password [facility] 03/15/2013, 4:02 PM

## 2013-03-15 NOTE — Consult Note (Signed)
Reason for Consult: Positive troponin Referring Physician:   [name] is an 72 y.o. female.  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    Patient presents today after getting up approximately of 600 hours this morning feeling dizzy with nausea, vomiting, diaphoresis. States that she's had some more unusual dyspnea on exertion lately. She said that elevated blood pressure as well when seen by her PCP recently. She otherwise denies chest pain, baseline shortness of breath, orthopnea, fever, vision change, cough, congestion, abdominal pain, hematochezia, melena, dysuria, hematuria, lower extremity edema.  Troponin is mildly elevated at 0.2.  Lactic acid 2.77. WBCs within normal limits  Past Medical History  Diagnosis Date  . Hypertension   . Atrial fibrillation     normal coronaries cath 2004. Dr Clarene [facility]   . Sick sinus syndrome     Dr Sharrell Ku. EP study negative. Pacemaker 12/15/06 Medtronic  . Hyperlipidemia   . Arthritis   . Anxiety   . Diverticula of colon   . Pneumonia 2009    resolved.? OPD Rx  . Hemorrhage intraabdominal 03/17/2012  . 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 Jude mechanical prosthesis. LA severe dilated  . 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 Tyrone Sage  . Presence of permanent cardiac pacemaker 12/15/06    Medtronic  . Subdural hematoma   . Liver hemorrhage   . Atrial flutter     Past Surgical History  Procedure Laterality Date  . Pacemaker insertion  12/15/06    Medtronic  . Mitral valve replacement  1998    St Jude mechanical   . Appendectomy    . Tubal ligation    . Tee  without cardioversion  09/23/2011    Procedure: TRANSESOPHAGEAL ECHOCARDIOGRAM (TEE);  Surgeon: Chrystie Nose;  Location: [facility];  Service: Cardiovascular;  Laterality: N/A;  . Cardiac valve replacement    . Cardiac catheterization  04/02/97    R&L:severe MR/pulmonary hypertension  . Insert / replace / remove pacemaker    . US echocardiography  11/19/2011    EF 50-55%,RA mod to severely dilated,LA severely dilated,trace MR,small vegetation or mass on the MV,AOV mildly scleroticmild PI, RV pressure 40-31mmHg  . Persantine cardiolite  08/07/03    mild inf. ischemia     Family History  Problem Relation Age of Onset  . Cancer Mother   . Stroke Father   . Cancer Sister     Social History:  reports that she has never smoked. She has never used smokeless tobacco. She reports that she does not drink alcohol or use illicit drugs.  Allergies:  Allergies  Allergen Reactions  . Codeine Nausea And Vomiting    Medications:  Prior to Admission medications   Medication Sig Start Date End Date Taking? Authorizing Provider  folic acid (FOLVITE) 400 MCG tablet Take 400 mcg by mouth every other day.   Yes Historical Provider, MD  metoprolol (LOPRESSOR) 50 MG tablet Take 1 tablet (50 mg total) by mouth 2 (two) times daily. 02/02/13  Yes Mihai Croitoru, MD  Multiple Vitamin (MULTIVITAMINS PO) Take by mouth daily.   Yes Historical Provider, MD  OVER THE COUNTER MEDICATION Place 1 drop into  both eyes 2 (two) times daily. Drops for dry eyes   Yes Historical Provider, MD  polyethylene glycol (MIRALAX / GLYCOLAX) packet Take 17 g by mouth as needed (for constipation).    Yes Historical Provider, MD  warfarin (COUMADIN) 5 MG tablet Take 2.5-5 mg by mouth daily at 6 PM. Takes 2.5mg  on Sunday and Wednesday, 5 mg on Monday, Tuesday, Thursday, Friday, Saturday 03/29/12 03/29/13 Yes Sherrie George, PA-C     Results for orders placed during the hospital encounter of 03/15/13 (from the past 48 hour(s))  CBC  WITH DIFFERENTIAL     Status: Abnormal   Collection Time    03/15/13  8:25 AM      Result Value Range   WBC 8.2  4.0 - 10.5 K/uL   RBC 4.35  3.87 - 5.11 MIL/uL   Hemoglobin 13.4  12.0 - 15.0 g/dL   HCT 16.1  09.6 - 04.5 %   MCV 89.2  78.0 - 100.0 fL   MCH 30.8  26.0 - 34.0 pg   MCHC 34.5  30.0 - 36.0 g/dL   RDW 40.9  81.1 - 91.4 %   Platelets 189  150 - 400 K/uL   Neutrophils Relative % 78 (*) 43 - 77 %   Neutro Abs 6.4  1.7 - 7.7 K/uL   Lymphocytes Relative 16  12 - 46 %   Lymphs Abs 1.3  0.7 - 4.0 K/uL   Monocytes Relative 5  3 - 12 %   Monocytes Absolute 0.4  0.1 - 1.0 K/uL   Eosinophils Relative 1  0 - 5 %   Eosinophils Absolute 0.1  0.0 - 0.7 K/uL   Basophils Relative 0  0 - 1 %   Basophils Absolute 0.0  0.0 - 0.1 K/uL  BASIC METABOLIC PANEL     Status: Abnormal   Collection Time    03/15/13  8:25 AM      Result Value Range   Sodium 140  135 - 145 mEq/L   Potassium 3.8  3.5 - 5.1 mEq/L   Chloride 103  96 - 112 mEq/L   CO2 26  19 - 32 mEq/L   Glucose, Bld 116 (*) 70 - 99 mg/dL   BUN 13  6 - 23 mg/dL   Creatinine, Ser 7.82  0.50 - 1.10 mg/dL   Calcium 9.7  8.4 - 95.6 mg/dL   GFR calc non Af Amer 82 (*) >90 mL/min   GFR calc Af Amer >90  >90 mL/min   Comment:            The eGFR has been calculated     using the CKD EPI equation.     This calculation has not been     validated in all clinical     situations.     eGFR's persistently     <90 mL/min signify     possible Chronic Kidney Disease.  PROTIME-INR     Status: Abnormal   Collection Time    03/15/13  8:25 AM      Result Value Range   Prothrombin Time 20.1 (*) 11.6 - 15.2 seconds   INR 1.78 (*) 0.00 - 1.49  LIPASE, BLOOD     Status: None   Collection Time    03/15/13  8:25 AM      Result Value Range   Lipase 51  11 - 59 U/L  POCT I-STAT TROPONIN I     Status: Abnormal   Collection Time  03/15/13  9:01 AM      Result Value Range   Troponin i, poc 0.20 (*) 0.00 - 0.08 ng/mL   Comment NOTIFIED  PHYSICIAN     Comment 3            Comment: Due to the release kinetics of cTnI,     a negative result within the first hours     of the onset of symptoms does not rule out     myocardial infarction with certainty.     If myocardial infarction is still suspected,     repeat the test at appropriate intervals.  CG4 I-STAT (LACTIC ACID)     Status: Abnormal   Collection Time    03/15/13  9:02 AM      Result Value Range   Lactic Acid, Venous 2.77 (*) 0.5 - 2.2 mmol/L    Dg Chest 2 View  03/15/2013   *RADIOLOGY REPORT*  Clinical Data: Weakness with emesis.  History of hypertension.  CHEST - 2 VIEW  Comparison: 03/22/2012 and 09/29/2011.  Findings: The left subclavian pacemaker is unchanged.  There is stable cardiomegaly status post median sternotomy and mitral valve replacement.  Overall basilar aeration has improved.  There is mild chronic vascular congestion.  No pleural effusion or pneumothorax is seen.  IMPRESSION: Overall improved aeration of the lung bases.  Stable cardiomegaly.   Original Report Authenticated By: Carey Bullocks, M.D.    Review of Systems  All other systems reviewed and are negative.   Blood pressure 174/108, pulse 74, temperature 97 F (36.1 C), temperature source Oral, resp. rate 20, SpO2 97.00%. Physical Exam  Nursing note and vitals reviewed. Constitutional: She is oriented to person, place, and time. She appears well-developed and well-nourished. No distress.  HENT:  Head: Normocephalic and atraumatic.  Mouth/Throat: Oropharynx is clear and moist. No oropharyngeal exudate.  Eyes: EOM are normal. Pupils are equal, round, and reactive to light. No scleral icterus.  Neck: Normal range of motion. Neck supple. No JVD present.  Cardiovascular: Normal rate, S1 normal and S2 normal.  An irregularly irregular rhythm present.  Murmur heard.  Systolic murmur is present with a grade of 1/6  Pulses:      Radial pulses are 2+ on the right side, and 2+ on the left side.        Dorsalis pedis pulses are 2+ on the right side, and 2+ on the left side.  No carotid bruit  Respiratory: Effort normal and breath sounds normal. No respiratory distress. She has no wheezes.  GI: Soft. Bowel sounds are normal. She exhibits no distension. There is no tenderness.  Musculoskeletal: She exhibits no edema.  No lower extremity edema  Lymphadenopathy:    She has no cervical adenopathy.  Neurological: She is alert and oriented to person, place, and time. She exhibits normal muscle tone.  Skin: Skin is warm and dry.  Psychiatric: She has a normal mood and affect.    Assessment/Plan: Active Problems:   S/P mitral valve replacement, St Jude   Presence of permanent cardiac pacemaker   Chronic anticoagulation, ( INR goal 2.0-2.5 due to history of subdural hematoma and liver hemorrhage)   Chronic atrial fibrillation   Hemorrhage, hepatic, Rx'd with Coumadin reversal and embolisation 03/17/12   SAH (subarachnoid hemorrhage), December 2012  Plan: Patient was started on IV heparin do to subtherapeutic INR. Troponin is mildly elevated and may be due to hypertension. She reports no episodes of chest pain or pressure which may indicate ACS.  Continue cycle troponin. EKG shows deep T-wave and ST changes which were present on prior EKGs. She does intermittently pace from the ventricle.  Current cardiac meds include metoprolol 50 mg twice daily and Cardizem 120 mg daily. She has not received her medications for today. We'll need better blood pressure control with adding either ACE or arm possibly hydralazine.  M.D. opinion to follow.  HAGER, BRYAN 03/15/2013, 11:30 AM   Agree with note written by Jones Skene [facility]  Admitted with nonspecific complaints (dizziness and not feeling well). She specifically denies CP/SOB. EKG intermittently paced with diffuse TWI which apparently are unchanged from prior EKGs. S/P MVR on coumadin AC (sub therapeutic). Trop .2. Doubt ACS. IV hep and adjust coumadin  dose. Cycle enz. Will follow with you.  Runell Gess 03/15/2013 2:40 PM

## 2013-03-15 NOTE — ED Notes (Signed)
Results of troponin called to Dr. Norlene Campbell

## 2013-03-15 NOTE — Progress Notes (Signed)
ANTICOAGULATION CONSULT NOTE - Initial Consult  Pharmacy Consult for Heparin Indication: chest pain/ACS  Allergies  Allergen Reactions  . Codeine Nausea And Vomiting    Patient Measurements:   Stated: Ht 61 in  Wt: 145 lb (66 kg)  IBW: 48 kg Heparin Dosing Weight: 61 kg (based on stated wt)  Vital Signs: Temp: 97 F (36.1 C) (06/25 0758) Temp src: Oral (06/25 0758) BP: 174/108 mmHg (06/25 0905) Pulse Rate: 74 (06/25 0905)  Labs:  Recent Labs  03/15/13 0825  HGB 13.4  HCT 38.8  PLT 189  LABPROT 20.1*  INR 1.78*  CREATININE 0.79    The CrCl is unknown because both a height and weight (above a minimum accepted value) are required for this calculation.   Medical History: Past Medical History  Diagnosis Date  . Hypertension   . Atrial fibrillation     normal coronaries cath 2004. Dr Clarene [facility]   . Sick sinus syndrome     Dr Sharrell Ku. EP study negative. Pacemaker 12/15/06 Medtronic  . Hyperlipidemia   . Arthritis   . Anxiety   . Diverticula of colon   . Pneumonia 2009    resolved.? OPD Rx  . Hemorrhage intraabdominal 03/17/2012  . 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 Jude mechanical prosthesis. LA severe dilated  . 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 Tyrone Sage  . Presence of permanent cardiac pacemaker 12/15/06    Medtronic  . Subdural hematoma   . Liver hemorrhage   . Atrial flutter     Medications:    Assessment: 72 y.o. female presents with N/V - non-bloody. Pt on coumadin PTA for afib and St Jude MVR. Baseline INR subtherapeutic 1.78 (goal 2.5-3 per Card 2013). Noted pt with positive troponins so heparin started for r/o ACS. CBC ok at baseline.  Goal of Therapy:  Heparin level 0.3-0.7 units/ml; INR 2.5-3 Monitor platelets by anticoagulation protocol: Yes   Plan:  1. Begin heparin gtt at 800 units/hr. No bolus. 2. Will f/u 8 hr heparin level 3. Daily heparin level and CBC 4. Will f/u  plans for possible cath or restarting coumadin  Christoper Fabian, PharmD, BCPS Clinical pharmacist, pager [phone] 03/15/2013,9:58 AM

## 2013-03-16 DIAGNOSIS — I059 Rheumatic mitral valve disease, unspecified: Secondary | ICD-10-CM

## 2013-03-16 DIAGNOSIS — R112 Nausea with vomiting, unspecified: Secondary | ICD-10-CM

## 2013-03-16 DIAGNOSIS — R7989 Other specified abnormal findings of blood chemistry: Secondary | ICD-10-CM

## 2013-03-16 LAB — COMPREHENSIVE METABOLIC PANEL
AST: 47 U/L — ABNORMAL HIGH (ref 0–37)
Albumin: 3.5 g/dL (ref 3.5–5.2)
BUN: 15 mg/dL (ref 6–23)
Creatinine, Ser: 0.91 mg/dL (ref 0.50–1.10)
Total Protein: 7.8 g/dL (ref 6.0–8.3)

## 2013-03-16 LAB — CBC
HCT: 40.2 % (ref 36.0–46.0)
MCHC: 34.1 g/dL (ref 30.0–36.0)
MCV: 89.5 fL (ref 78.0–100.0)
Platelets: 199 10*3/uL (ref 150–400)
RDW: 15.5 % (ref 11.5–15.5)

## 2013-03-16 LAB — HEPARIN LEVEL (UNFRACTIONATED)
Heparin Unfractionated: 0.49 IU/mL (ref 0.30–0.70)
Heparin Unfractionated: 0.73 IU/mL — ABNORMAL HIGH (ref 0.30–0.70)
Heparin Unfractionated: 0.44 IU/mL (ref 0.30–0.70)

## 2013-03-16 LAB — PROTIME-INR
INR: 1.86 — ABNORMAL HIGH (ref 0.00–1.49)
Prothrombin Time: 20.9 seconds — ABNORMAL HIGH (ref 11.6–15.2)

## 2013-03-16 MED ORDER — HEPARIN (PORCINE) IN NACL 100-0.45 UNIT/ML-% IJ SOLN
1100.0000 [IU]/h | INTRAMUSCULAR | Status: DC
  Administered 2013-03-18: 1150 [IU]/h via INTRAVENOUS
  Administered 2013-03-19 – 2013-03-20 (×2): 1100 [IU]/h via INTRAVENOUS
  Filled 2013-03-16 (×4): qty 250

## 2013-03-16 NOTE — Progress Notes (Signed)
ANTICOAGULATION CONSULT NOTE - Follow-up Consult  Pharmacy Consult for Heparin Indication: chest pain/ACS  Allergies  Allergen Reactions  . Codeine Nausea And Vomiting    Patient Measurements: Height: 5\' 1"  (154.9 cm) Weight: 138 lb 11.2 oz (62.914 kg) IBW/kg (Calculated) : 47.8 Stated: Ht 61 in  Wt: 145 lb (66 kg)  IBW: 48 kg Heparin Dosing Weight: 61 kg (based on stated wt)  Vital Signs: Temp: 97.6 F (36.4 C) (06/26 2052) Temp src: Oral (06/26 2052) BP: 159/90 mmHg (06/26 2052) Pulse Rate: 65 (06/26 2052)  Labs:  Recent Labs  03/15/13 0825 03/15/13 1610  03/15/13 2152 03/16/13 0440 03/16/13 1056 03/16/13 2109  HGB 13.4  --   --   --  13.7  --   --   HCT 38.8  --   --   --  40.2  --   --   PLT 189  --   --   --  199  --   --   LABPROT 20.1*  --   --   --   --  20.9*  --   INR 1.78*  --   --   --   --  1.86*  --   HEPARINUNFRC  --   --   < >  --  0.49 0.73* 0.44  CREATININE 0.79  --   --   --  0.91  --   --   TROPONINI  --  <0.30  --  0.48* 0.44*  --   --   < > = values in this interval not displayed.  Estimated Creatinine Clearance: 48.2 ml/min (by C-G formula based on Cr of 0.91).   Medical History: Past Medical History  Diagnosis Date  . Hypertension   . Atrial fibrillation     normal coronaries cath 2004. Dr Clarene Duke [facility]   . Sick sinus syndrome     Dr Sharrell Ku. EP study negative. Pacemaker 12/15/06 Medtronic  . Hyperlipidemia   . Anxiety   . Diverticula of colon   . Hemorrhage intraabdominal 03/17/2012  . 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 Jude mechanical prosthesis. LA severe dilated  . Subdural hematoma   . Liver hemorrhage   . Atrial flutter   . Pacemaker   . Heart murmur   . CHF (congestive heart failure)   . Pneumonia 2009    resolved.? OPD Rx  . Exertional shortness of breath   . History of blood transfusion     "once" (03/15/2013)  . GERD (gastroesophageal reflux disease)   . Migraines   . Stroke    "they say I had a stroke last year" denies residual on 03/15/2013  . Arthritis     "right shoulder" (03/15/2013)    Medications:  . heparin 850 Units/hr (03/16/13 1245)    Assessment: 72 y.o. female presents with N/V - non-bloody. Pt on coumadin PTA for afib and St Jude MVR. Baseline INR subtherapeutic 1.78 (goal 2-2.5 per Cards note secondary to history subdural, liver hemorrhage). Noted pt with positive troponins so heparin started for r/o ACS. CBC ok at baseline.  Heparin level 0.44 now in goal.  Goal of Therapy:  Heparin level 0.3-0.7 units/ml Monitor platelets by anticoagulation protocol: Yes   Plan:  1. Continue heparin at 850units/hr and recheck level in am.  Eulogia Dismore S. Merilynn Finland, PharmD, [facility] Clinical Staff Pharmacist Pager [phone]   03/16/2013 9:39 PM

## 2013-03-16 NOTE — Progress Notes (Signed)
ANTICOAGULATION CONSULT NOTE - Follow-up Consult  Pharmacy Consult for Heparin Indication: chest pain/ACS  Allergies  Allergen Reactions  . Codeine Nausea And Vomiting    Patient Measurements: Height: 5\' 1"  (154.9 cm) Weight: 138 lb 11.2 oz (62.914 kg) IBW/kg (Calculated) : 47.8 Stated: Ht 61 in  Wt: 145 lb (66 kg)  IBW: 48 kg Heparin Dosing Weight: 61 kg (based on stated wt)  Vital Signs: Temp: 98.1 F (36.7 C) (06/26 0529) Temp src: Oral (06/26 0529) BP: 114/55 mmHg (06/26 0529) Pulse Rate: 75 (06/26 0529)  Labs:  Recent Labs  03/15/13 0825 03/15/13 1610 03/15/13 1800 03/15/13 2152 03/16/13 0440 03/16/13 1056  HGB 13.4  --   --   --  13.7  --   HCT 38.8  --   --   --  40.2  --   PLT 189  --   --   --  199  --   LABPROT 20.1*  --   --   --   --  20.9*  INR 1.78*  --   --   --   --  1.86*  HEPARINUNFRC  --   --  0.30  --  0.49 0.73*  CREATININE 0.79  --   --   --  0.91  --   TROPONINI  --  <0.30  --  0.48* 0.44*  --     Estimated Creatinine Clearance: 48.2 ml/min (by C-G formula based on Cr of 0.91).   Medical History: Past Medical History  Diagnosis Date  . Hypertension   . Atrial fibrillation     normal coronaries cath 2004. Dr Clarene Duke [facility]   . Sick sinus syndrome     Dr Sharrell Ku. EP study negative. Pacemaker 12/15/06 Medtronic  . Hyperlipidemia   . Anxiety   . Diverticula of colon   . Hemorrhage intraabdominal 03/17/2012  . 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 Jude mechanical prosthesis. LA severe dilated  . Subdural hematoma   . Liver hemorrhage   . Atrial flutter   . Pacemaker   . Heart murmur   . CHF (congestive heart failure)   . Pneumonia 2009    resolved.? OPD Rx  . Exertional shortness of breath   . History of blood transfusion     "once" (03/15/2013)  . GERD (gastroesophageal reflux disease)   . Migraines   . Stroke     "they say I had a stroke last year" denies residual on 03/15/2013  . Arthritis    "right shoulder" (03/15/2013)    Medications:  . heparin      Assessment: 72 y.o. female presents with N/V - non-bloody. Pt on coumadin PTA for afib and St Jude MVR. Baseline INR subtherapeutic 1.78 (goal 2-2.5 per Cards note). Noted pt with positive troponins so heparin started for r/o ACS. CBC ok at baseline.  Heparin level slightly supra-therapeutic on 950 units/hr.  No bleeding or complications noted per chart notes.  Coumadin currently on hold.  Spoke to [employer], PA this AM, he said he'll investigate if OK to resume.  Goal of Therapy:  Heparin level 0.3-0.7 units/ml Monitor platelets by anticoagulation protocol: Yes   Plan:  1. Reduce heparin rate to 850 units/hr. 2. Will check heparin level at 1900 to confirm. 3. Daily heparin level and CBC 4. Will f/u plans for possible cath or restarting Coumadin  Tad Moore, BCPS  Clinical Pharmacist Pager [phone]  03/16/2013 12:35 PM

## 2013-03-16 NOTE — ED Provider Notes (Signed)
Medical screening examination/treatment/procedure(s) were conducted as a shared visit with non-physician practitioner(s) and myself.  I personally evaluated the patient during the encounter.  Pt with n/v this morning, malaise.  EKG unchanged, lactate and troponin elevated.  Concern for anginal equivalent.  Cardiology consulted, expect admission.  Olivia Mackie, MD 03/16/13 [phone]

## 2013-03-16 NOTE — Progress Notes (Signed)
Contacted by lab with Critical Lab value of Troponin I 0.48 0005 on 03/16/2013.  HCP provider paged at 0014 on 03/16/2013 with critical value.  Thane Edu, RN

## 2013-03-16 NOTE — Progress Notes (Signed)
ANTICOAGULATION CONSULT NOTE - Follow-up Consult  Pharmacy Consult for Heparin Indication: chest pain/ACS  Allergies  Allergen Reactions  . Codeine Nausea And Vomiting    Patient Measurements: Height: 5\' 1"  (154.9 cm) Weight: 138 lb 11.2 oz (62.914 kg) IBW/kg (Calculated) : 47.8 Stated: Ht 61 in  Wt: 145 lb (66 kg)  IBW: 48 kg Heparin Dosing Weight: 61 kg (based on stated wt)  Vital Signs: Temp: 98.1 F (36.7 C) (06/26 0529) Temp src: Oral (06/26 0529) BP: 114/55 mmHg (06/26 0529) Pulse Rate: 75 (06/26 0529)  Labs:  Recent Labs  03/15/13 0825 03/15/13 1610 03/15/13 1800 03/15/13 2152 03/16/13 0440  HGB 13.4  --   --   --  13.7  HCT 38.8  --   --   --  40.2  PLT 189  --   --   --  199  LABPROT 20.1*  --   --   --   --   INR 1.78*  --   --   --   --   HEPARINUNFRC  --   --  0.30  --  0.49  CREATININE 0.79  --   --   --  0.91  TROPONINI  --  <0.30  --  0.48* 0.44*    Estimated Creatinine Clearance: 48.2 ml/min (by C-G formula based on Cr of 0.91).   Medical History: Past Medical History  Diagnosis Date  . Hypertension   . Atrial fibrillation     normal coronaries cath 2004. Dr Clarene Duke [facility]   . Sick sinus syndrome     Dr Sharrell Ku. EP study negative. Pacemaker 12/15/06 Medtronic  . Hyperlipidemia   . Anxiety   . Diverticula of colon   . Hemorrhage intraabdominal 03/17/2012  . 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 Jude mechanical prosthesis. LA severe dilated  . Subdural hematoma   . Liver hemorrhage   . Atrial flutter   . Pacemaker   . Heart murmur   . CHF (congestive heart failure)   . Pneumonia 2009    resolved.? OPD Rx  . Exertional shortness of breath   . History of blood transfusion     "once" (03/15/2013)  . GERD (gastroesophageal reflux disease)   . Migraines   . Stroke     "they say I had a stroke last year" denies residual on 03/15/2013  . Arthritis     "right shoulder" (03/15/2013)    Medications:  . heparin  950 Units/hr (03/15/13 2154)    Assessment: 72 y.o. female presents with N/V - non-bloody. Pt on coumadin PTA for afib and St Jude MVR. Baseline INR subtherapeutic 1.78 (goal 2-2.5 per Cards note). Noted pt with positive troponins so heparin started for r/o ACS. CBC ok at baseline.  Heparin level therapeutic on 950 units/hr.  No bleeding or complications noted per chart notes.  Goal of Therapy:  Heparin level 0.3-0.7 units/ml Monitor platelets by anticoagulation protocol: Yes   Plan:  1. Continue IV heparin at current rate of 950 units/hr. 2. Will check heparin level at 11 AM to confirm. 3. Daily heparin level and CBC 4. Will f/u plans for possible cath or restarting Coumadin  Tad Moore, BCPS  Clinical Pharmacist Pager [phone]  03/16/2013 9:01 AM

## 2013-03-16 NOTE — Progress Notes (Signed)
[facility]  Subjective: Pt reports significant improvement in symptoms. No further dizziness, nausea or vomiting. No chest pain.   Objective: Vital signs in last 24 hours: Temp:  [97.8 F (36.6 C)-98.3 F (36.8 C)] 98.1 F (36.7 C) (06/26 0529) Pulse Rate:  [57-94] 75 (06/26 0529) Resp:  [13-22] 17 (06/26 0529) BP: (110-174)/(55-96) 114/55 mmHg (06/26 0529) SpO2:  [95 %-99 %] 95 % (06/26 0529) Weight:  [138 lb 11.2 oz (62.914 kg)] 138 lb 11.2 oz (62.914 kg) (06/25 1751) Last BM Date: 03/14/13  Intake/Output from previous day: 06/25 0701 - 06/26 0700 In: -  Out: 1050 [Urine:1050] Intake/Output this shift: Total I/O In: 240 [P.O.:240] Out: -   Medications Current Facility-Administered Medications  Medication Dose Route Frequency Provider Last Rate Last Dose  . 0.9 %  sodium chloride infusion  250 mL Intravenous PRN Richarda Overlie, MD      . acetaminophen (TYLENOL) tablet 650 mg  650 mg Oral Q6H PRN Richarda Overlie, MD       Or  . acetaminophen (TYLENOL) suppository 650 mg  650 mg Rectal Q6H PRN Richarda Overlie, MD      . diltiazem (CARDIZEM CD) 24 hr capsule 120 mg  120 mg Oral Daily Richarda Overlie, MD   120 mg at 03/16/13 1040  . heparin ADULT infusion 100 units/mL (25000 units/250 mL)  950 Units/hr Intravenous Continuous Richarda Overlie, MD 9.5 mL/hr at 03/15/13 2154 950 Units/hr at 03/15/13 2154  . levalbuterol (XOPENEX) nebulizer solution 0.63 mg  0.63 mg Nebulization Q6H PRN Richarda Overlie, MD      . LORazepam (ATIVAN) tablet 0.5 mg  0.5 mg Oral QHS PRN Richarda Overlie, MD      . metoprolol (LOPRESSOR) tablet 50 mg  50 mg Oral BID Richarda Overlie, MD   50 mg at 03/16/13 1040  . ondansetron (ZOFRAN) tablet 4 mg  4 mg Oral Q6H PRN Richarda Overlie, MD       Or  . ondansetron (ZOFRAN) injection 4 mg  4 mg Intravenous Q6H PRN Richarda Overlie, MD      . sodium chloride 0.9 % injection 3 mL  3 mL Intravenous Q12H Nayana Abrol, MD      . sodium chloride 0.9 % injection 3  mL  3 mL Intravenous Q12H Nayana Abrol, MD      . sodium chloride 0.9 % injection 3 mL  3 mL Intravenous PRN Richarda Overlie, MD      . zolpidem (AMBIEN) tablet 5 mg  5 mg Oral QHS PRN Richarda Overlie, MD        PE: General appearance: alert, cooperative and no distress Lungs: clear to auscultation bilaterally Heart: irregularly irregular rhythm Extremities: no LEE Pulses: 2+ and symmetric Skin: warm and dry Neurologic: Grossly normal  Lab Results:   Recent Labs  03/15/13 0825 03/16/13 0440  WBC 8.2 7.7  HGB 13.4 13.7  HCT 38.8 40.2  PLT 189 199   BMET  Recent Labs  03/15/13 0825 03/16/13 0440  NA 140 138  K 3.8 3.6  CL 103 104  CO2 26 23  GLUCOSE 116* 86  BUN 13 15  CREATININE 0.79 0.91  CALCIUM 9.7 9.4   PT/INR  Recent Labs  03/15/13 0825  LABPROT 20.1*  INR 1.78*   Cardiac Panel (last 3 results)  Recent Labs  03/15/13 1610 03/15/13 2152 03/16/13 0440  TROPONINI <0.30 0.48* 0.44*     Assessment/Plan  Active Problems:   S/P mitral valve replacement, St  Jude   Presence of permanent cardiac pacemaker   Chronic anticoagulation, ( INR goal 2.0-2.5 due to history of subdural hematoma and liver hemorrhage)   Chronic atrial fibrillation   Hemorrhage, hepatic, Rx'd with Coumadin reversal and embolisation 03/17/12   SAH (subarachnoid hemorrhage), December 2012  Plan: Symptoms have resolved. However, her troponin increased to 0.48 and 0.44. She denies chest pain. EKG yesterday showed diffuse TWI. Will obtain old office EKGs from [facility] today for comparison and to help guide treatment decisions. Her BP is better controlled now that Diltiazem has been restarted. However, this was apparently discontinued by Dr. Royann Shivers  at last office visit in April 2014. His assessment and plan reads, "We are trying to minimize the frequency of ventricular pacing both to conserve battery life and also to avoid deleterious effects of right ventricular apical pacing. I have asked her to  stop her diltiazem altogether". Will need to substitute with another antihypertensive. May consider amlodipine as it has little effect on AV nodal blockage. Her A-fib is rate controlled. INR was subtherapeutic yesterday. INR for today is pending. Goal is 2.0-2.5. Dr. Allyson Sabal to review old EKGs and will follow with further recommendation.     LOS: 1 day    Brittainy M. Delmer Islam 03/16/2013 10:47 AM  Agree with note written by Boyce Medici  PAC  Doubt the mild increase in trop to .4 signif in light of atypical symptoms. Pt on IV hep secondary to sub theraputic INR. Exam benign. DC home once INR > 2.o. Will review office EKGs to compare with ones done in ER when not paced.   Runell Gess 03/16/2013 3:52 PM

## 2013-03-16 NOTE — Progress Notes (Signed)
Patient evaluated for long-term disease management services with [facility] as a benefit of her [employer] insurance. Explained services at bedside. However, she pleasantly declined. Left contact information and [facility] Care Management brochure for patient to call if she should change her mind in the future. Appreciative of visit.  Hal Morales, [facility], [phone]

## 2013-03-16 NOTE — Progress Notes (Signed)
  Echocardiogram 2D Echocardiogram has been performed.  Carrie [name] 03/16/2013, 1:36 PM

## 2013-03-16 NOTE — Progress Notes (Signed)
PROGRESS NOTE  Carrie FIERRO WUJ:[id number] DOB: 07-25-1941 DOA: 03/15/2013 PCP: Geraldo Pitter, MD  Brief narrative: 72 yr female, known mitral valve replacement St. Jude, permanent pacemaker, chronic atrial fibrillation, hepatic hemorrhage and subarachnoid hemorrhage 03/17/2012, December 2012 respectively admitted 03/15/2013 with nausea vomiting, nonbloody nonbilious no sick contacts recent illnesses or other issues. Noted to have initial elevated troponin 0.2, lactic acid 2.77. Cardiology consulted regarding elevated troponin-patient placed on heparin drip, troponins were cycled and EKG showed deep T-wave ST-T wave changes with intermittent pacing and she does have a pacemaker for permanent atrial fibrillation  Past medical history-As per Problem list Chart reviewed as below-  Seen by Dr. 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  Admission 03/16/2012 4 hepatic hemorrhage, had arteriogram and embolization with reversal of her INR transfusion and reimplantation of her anticoagulation  Admission 09/06/2011 for streptococcal sepsis with possibility of endocarditis, acute encephalopathy, subarachnoid hemorrhage  Admission 12/15/2006 and 02/22/2007 4 heparin Coumadin cross cover for permanent pacemaker placement, MV replacement 1998, sick sinus syndrome, syncope in the past with prior loop recorder  Admission 11/09/2005 for near syncopal spell status post loop recorder  [place] 09/05/2003 for syncope, positive tilt table, PAF  Notable presumed history rheumatic fever as a child was seen showed mitral valve repair in 1998  Consultants:  Cardiology  Procedures:  Two-view chest x-ray 6/25  Ultrasound abdomen 6/25   CT head 6/25  Antibiotics:  None currently   Subjective  Doing well. Thinks that her nausea vomiting related to some mild headache she's had in the past. She states that she has history of migraines but has not had a migraine in the  remote past. She feels much better and has no further nausea vomiting   Objective    Interim History: nad   Telemetry: Sinus rhythm  Objective: Filed Vitals:   03/15/13 1939 03/15/13 2154 03/16/13 0529 03/16/13 1345  BP: 135/69 110/72 114/55 124/75  Pulse: 76 94 75 65  Temp: 97.8 F (36.6 C)  98.1 F (36.7 C) 97 F (36.1 C)  TempSrc: Oral  Oral Oral  Resp: 16  17 18   Height:      Weight:      SpO2: 95%  95% 96%    Intake/Output Summary (Last 24 hours) at 03/16/13 1625 Last data filed at 03/16/13 1300  Gross per 24 hour  Intake    360 ml  Output   1400 ml  Net  -1040 ml    Exam:  General: alert very pleasant oriented after female looking younger than stated Cardiovascular: and S2 no murmur rub or Respiratory: clinically clear Abdomen: of nontender nondistended no rebound Skinno edema Neurois intact  Data Reviewed: Basic Metabolic Panel:  Recent Labs Lab 03/15/13 0825 03/16/13 0440  NA 140 138  K 3.8 3.6  CL 103 104  CO2 26 23  GLUCOSE 116* 86  BUN 13 15  CREATININE 0.79 0.91  CALCIUM 9.7 9.4   Liver Function Tests:  Recent Labs Lab 03/15/13 1610 03/16/13 0440  AST 57* 47*  ALT 28 24  ALKPHOS 132* 123*  BILITOT 0.6 0.6  PROT 8.5* 7.8  ALBUMIN 4.1 3.5    Recent Labs Lab 03/15/13 0825 03/15/13 1610  LIPASE 51 50   No results found for this basename: AMMONIA,  in the last 168 hours CBC:  Recent Labs Lab 03/15/13 0825 03/16/13 0440  WBC 8.2 7.7  NEUTROABS 6.4  --   HGB 13.4 13.7  HCT 38.8 40.2  MCV 89.2 89.5  PLT 189 199   Cardiac Enzymes:  Recent Labs Lab 03/15/13 1610 03/15/13 2152 03/16/13 0440  TROPONINI <0.30 0.48* 0.44*   BNP: No components found with this basename: POCBNP,  CBG: No results found for this basename: GLUCAP,  in the last 168 hours  No results found for this or any previous visit (from the past 240 hour(s)).   Studies:              All Imaging reviewed and is as per above notation    Scheduled Meds: . diltiazem  120 mg Oral Daily  . metoprolol  50 mg Oral BID  . sodium chloride  3 mL Intravenous Q12H  . sodium chloride  3 mL Intravenous Q12H   Continuous Infusions: . heparin 850 Units/hr (03/16/13 1245)     Assessment/Plan: 1. Nausea vomiting-unclear etiology. This seems to have resolved presently. Abdominal ultrasound which was performed out of concern for remote history of hepatic hemorrhage 03/17/2013 does not show any obstruction or further issues. 2. Elevated troponin-likely rate relatedversus blood pressure related. Cardiology working up planned for rate control 3. Chronic Atrial fibrillation on Coumadin CHad2Vasc2 score~4-she needs cautious Coumadin reimplementation for multiple reasons inclusive of mitral valve replacement, sick sinus.  Continue for now Cardizem 120 mg daily, metoprolol 50 twice a day.she's been pushed back to therapeutic INR of 2.0 prior to discharge 4. Chronic mitral valve, INR goal 2.0-2.5 secondary to history subdural, liver hemorrhage   Code Status: full Family Communication:none at bedside Disposition Plan: inpatient   Pleas Koch, MD  [facility] Pager [phone] 03/16/2013, 4:25 PM    LOS: 1 day

## 2013-03-17 DIAGNOSIS — I609 Nontraumatic subarachnoid hemorrhage, unspecified: Secondary | ICD-10-CM

## 2013-03-17 LAB — CBC
Platelets: 196 10*3/uL (ref 150–400)
RDW: 15.7 % — ABNORMAL HIGH (ref 11.5–15.5)
WBC: 6.7 10*3/uL (ref 4.0–10.5)

## 2013-03-17 LAB — PROTIME-INR
INR: 1.72 — ABNORMAL HIGH (ref 0.00–1.49)
Prothrombin Time: 19.7 seconds — ABNORMAL HIGH (ref 11.6–15.2)

## 2013-03-17 LAB — HEPARIN LEVEL (UNFRACTIONATED): Heparin Unfractionated: 0.55 IU/mL (ref 0.30–0.70)

## 2013-03-17 MED ORDER — WARFARIN SODIUM 5 MG PO TABS
5.0000 mg | ORAL_TABLET | Freq: Once | ORAL | Status: AC
  Administered 2013-03-17: 5 mg via ORAL
  Filled 2013-03-17: qty 1

## 2013-03-17 MED ORDER — WARFARIN - PHARMACIST DOSING INPATIENT
Freq: Every day | Status: DC
  Administered 2013-03-17: 18:00:00

## 2013-03-17 NOTE — Progress Notes (Signed)
ANTICOAGULATION CONSULT NOTE - Follow-up Consult  Pharmacy Consult for Heparin (chronic Coumadin on hold) Indication: chest pain/ACS  Allergies  Allergen Reactions  . Codeine Nausea And Vomiting    Patient Measurements: Height: 5\' 1"  (154.9 cm) Weight: 138 lb 11.2 oz (62.914 kg) IBW/kg (Calculated) : 47.8 Stated: Ht 61 in  Wt: 145 lb (66 kg)  IBW: 48 kg Heparin Dosing Weight: 61 kg (based on stated wt)  Vital Signs: Temp: 97.6 F (36.4 C) (06/27 0752) Temp src: Oral (06/27 0752) BP: 151/80 mmHg (06/27 0752) Pulse Rate: 75 (06/27 0752)  Labs:  Recent Labs  03/15/13 0825 03/15/13 1610  03/15/13 2152 03/16/13 0440 03/16/13 1056 03/16/13 2109 03/17/13 0540  HGB 13.4  --   --   --  13.7  --   --  14.1  HCT 38.8  --   --   --  40.2  --   --  42.2  PLT 189  --   --   --  199  --   --  196  LABPROT 20.1*  --   --   --   --  20.9*  --  19.7*  INR 1.78*  --   --   --   --  1.86*  --  1.72*  HEPARINUNFRC  --   --   < >  --  0.49 0.73* 0.44 0.55  CREATININE 0.79  --   --   --  0.91  --   --   --   TROPONINI  --  <0.30  --  0.48* 0.44*  --   --   --   < > = values in this interval not displayed.  Estimated Creatinine Clearance: 48.2 ml/min (by C-G formula based on Cr of 0.91).   Medical History: Past Medical History  Diagnosis Date  . Hypertension   . Atrial fibrillation     normal coronaries cath 2004. Dr Clarene [facility]   . Sick sinus syndrome     Dr Sharrell Ku. EP study negative. Pacemaker 12/15/06 [employer]  . Hyperlipidemia   . Anxiety   . Diverticula of colon   . Hemorrhage intraabdominal 03/17/2012  . 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 Jude mechanical prosthesis. LA severe dilated  . Subdural hematoma   . Liver hemorrhage   . Atrial flutter   . Pacemaker   . Heart murmur   . CHF (congestive heart failure)   . Pneumonia 2009    resolved.? OPD Rx  . Exertional shortness of breath   . History of blood transfusion     "once"  (03/15/2013)  . GERD (gastroesophageal reflux disease)   . Migraines   . Stroke     "they say I had a stroke last year" denies residual on 03/15/2013  . Arthritis     "right shoulder" (03/15/2013)    Medications:  . heparin 850 Units/hr (03/16/13 1245)    Assessment: 72 y.o. female presents with N/V - non-bloody. Pt on coumadin PTA for afib and St Jude MVR, currently Coumadin on hold.  INR subtherapeutic 1.72 (goal 2-2.5 per Cards note secondary to history subdural, liver hemorrhage). Noted pt with positive troponins so heparin started for r/o ACS. CBC ok at baseline.  Heparin level 0.55 now in goal range.  No bleeding or complications noted per chart notes.  Goal of Therapy:  Heparin level 0.3-0.7 units/ml Monitor platelets by anticoagulation protocol: Yes   Plan:  1. Continue heparin at 850 units/hr. 2. Continue  daily heparin level and CBC. 3. F/u plans for Coumadin - resume today?  Tad Moore, BCPS  Clinical Pharmacist Pager [phone]  03/17/2013 8:46 AM

## 2013-03-17 NOTE — Care Management Note (Unsigned)
    Page 1 of 1   03/17/2013     3:09:40 PM   CARE MANAGEMENT NOTE 03/17/2013  Patient:  Carrie [name], Carrie [name]   Account Number:  [id number]  Date Initiated:  03/17/2013  Documentation initiated by:  Stana Bayon  Subjective/Objective Assessment:   PT ADM ON 03/15/13 WITH CP, N/V, ELEVATED TROPONINS.  PTA, PT INDEPENDENT, LIVES WITH SPOUSE.     Action/Plan:   WILL FOLLOW FOR HOME NEEDS AS PT PROGRESSES.   Anticipated DC Date:  03/19/2013   Anticipated DC Plan:  HOME/SELF CARE      DC Planning Services  CM consult      Choice offered to / List presented to:             Status of service:  In process, will continue to follow Medicare Important Message given?   (If response is "NO", the following Medicare IM given date fields will be blank) Date Medicare IM given:   Date Additional Medicare IM given:    Discharge Disposition:    Per UR Regulation:  Reviewed for med. necessity/level of care/duration of stay  If discussed at Long Length of Stay Meetings, dates discussed:    Comments:

## 2013-03-17 NOTE — Progress Notes (Signed)
ANTICOAGULATION CONSULT NOTE - Follow-up Consult  Pharmacy Consult for Heparin ; resume Coumadin 6/27 Indication: chest pain/ACS  Allergies  Allergen Reactions  . Codeine Nausea And Vomiting    Patient Measurements: Height: 5\' 1"  (154.9 cm) Weight: 138 lb 11.2 oz (62.914 kg) IBW/kg (Calculated) : 47.8 Stated: Ht 61 in  Wt: 145 lb (66 kg)  IBW: 48 kg Heparin Dosing Weight: 61 kg (based on stated wt)  Vital Signs: Temp: 97.6 F (36.4 C) (06/27 0752) Temp src: Oral (06/27 0752) BP: 151/80 mmHg (06/27 0752) Pulse Rate: 75 (06/27 0752)  Labs:  Recent Labs  03/15/13 0825 03/15/13 1610  03/15/13 2152 03/16/13 0440 03/16/13 1056 03/16/13 2109 03/17/13 0540  HGB 13.4  --   --   --  13.7  --   --  14.1  HCT 38.8  --   --   --  40.2  --   --  42.2  PLT 189  --   --   --  199  --   --  196  LABPROT 20.1*  --   --   --   --  20.9*  --  19.7*  INR 1.78*  --   --   --   --  1.86*  --  1.72*  HEPARINUNFRC  --   --   < >  --  0.49 0.73* 0.44 0.55  CREATININE 0.79  --   --   --  0.91  --   --   --   TROPONINI  --  <0.30  --  0.48* 0.44*  --   --   --   < > = values in this interval not displayed.  Estimated Creatinine Clearance: 48.2 ml/min (by C-G formula based on Cr of 0.91).   Medical History: Past Medical History  Diagnosis Date  . Hypertension   . Atrial fibrillation     normal coronaries cath 2004. Dr Clarene [facility]   . Sick sinus syndrome     Dr Sharrell Ku. EP study negative. Pacemaker 12/15/06 [employer]  . Hyperlipidemia   . Anxiety   . Diverticula of colon   . Hemorrhage intraabdominal 03/17/2012  . 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 Jude mechanical prosthesis. LA severe dilated  . Subdural hematoma   . Liver hemorrhage   . Atrial flutter   . Pacemaker   . Heart murmur   . CHF (congestive heart failure)   . Pneumonia 2009    resolved.? OPD Rx  . Exertional shortness of breath   . History of blood transfusion     "once" (03/15/2013)   . GERD (gastroesophageal reflux disease)   . Migraines   . Stroke     "they say I had a stroke last year" denies residual on 03/15/2013  . Arthritis     "right shoulder" (03/15/2013)    Medications:  . heparin 850 Units/hr (03/16/13 1245)    Assessment: 72 y.o. female presents with N/V - non-bloody. Pt on coumadin PTA for afib and St Jude MVR, currently Coumadin on hold.  INR subtherapeutic 1.72 (goal 2-2.5 per Cards note secondary to history subdural, liver hemorrhage). Noted pt with positive troponins so heparin started for r/o ACS. CBC ok at baseline.  Home Coumadin dose 5 mg daily except 2.5 mg on Sun, Wed.   Last Coumadin dose given at home 6/24.  Expect INR may continue to trend down for a day or two.  Heparin level 0.55 now in goal range.  No bleeding or complications noted per chart notes.  Goal of Therapy:  Heparin level 0.3-0.7 units/ml Monitor platelets by anticoagulation protocol: Yes   Plan:  1. Continue heparin at 850 units/hr. 2. Continue daily heparin level and CBC. 3. Resume Coumadin with 5 mg po x 1 tonight. 4. Daily INR.  Tad Moore, BCPS  Clinical Pharmacist Pager [phone]  03/17/2013 9:42 AM

## 2013-03-17 NOTE — Progress Notes (Signed)
Pt. Seen and examined. Agree with the NP/PA-C note as written.  Continues to have a subtherapeutic INR. ?if warfarin was held. No plans for further cardiac intervention.  Goal INR 2-2.5 (lower than recommended due to prior subdural hematoma). Continue plan for d/c once INR >2.0.  Chrystie Nose, MD, [facility] Attending Cardiologist [facility]

## 2013-03-17 NOTE — Progress Notes (Signed)
PROGRESS NOTE  Carrie [name] XLK:[id number] DOB: 1941-03-28 DOA: 03/15/2013 PCP: Geraldo Pitter, MD  Brief narrative: 72 yr female, known mitral valve replacement St. Jude, permanent pacemaker, chronic atrial fibrillation, hepatic hemorrhage and subarachnoid hemorrhage 03/17/2012, December 2012 respectively admitted 03/15/2013 with nausea vomiting, nonbloody nonbilious no sick contacts recent illnesses or other issues. Noted to have initial elevated troponin 0.2, lactic acid 2.77. Cardiology consulted regarding elevated troponin-patient placed on heparin drip, troponins were cycled and EKG showed deep T-wave ST-T wave changes with intermittent pacing. Heparin ultimately stopped and decided to transition back to Coumadin as this was not thought to be ACS related.  Past medical history-As per Problem list Chart reviewed as below-  Seen by Dr. 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  Admission 03/16/2012 4 hepatic hemorrhage, had arteriogram and embolization with reversal of her INR transfusion and reimplantation of her anticoagulation  Admission 09/06/2011 for streptococcal sepsis with possibility of endocarditis, acute encephalopathy, subarachnoid hemorrhage  Admission 12/15/2006 and 02/22/2007 4 heparin Coumadin cross cover for permanent pacemaker placement, MV replacement 1998, sick sinus syndrome, syncope in the past with prior loop recorder  Admission 11/09/2005 for near syncopal spell status post loop recorder  [place] 09/05/2003 for syncope, positive tilt table, PAF  Notable presumed history rheumatic fever as a child was seen showed mitral valve repair in 1998  Consultants:  Cardiology  Procedures:  Two-view chest x-ray 6/25  Ultrasound abdomen 6/25   CT head 6/25  Antibiotics:  None currently   Subjective  Doing well. Thinks that her nausea vomiting completely resolved Thinks that she was a little dizzy on admission and now having  gross issues   Objective    Interim History: nad   Telemetry: Sinus rhythm  Objective: Filed Vitals:   03/16/13 2052 03/17/13 0442 03/17/13 0752 03/17/13 1334  BP: 159/90 133/70 151/80 137/83  Pulse: 65 67 75 76  Temp: 97.6 F (36.4 C) 97.3 F (36.3 C) 97.6 F (36.4 C) 97.8 F (36.6 C)  TempSrc: Oral Oral Oral Oral  Resp: 18 17 18    Height:      Weight:      SpO2: 96% 96% 97%     Intake/Output Summary (Last 24 hours) at 03/17/13 1657 Last data filed at 03/17/13 0837  Gross per 24 hour  Intake    300 ml  Output      0 ml  Net    300 ml    Exam:  General: alert very pleasant oriented after female looking younger than stated Cardiovascular: and S2 no murmur rub or Respiratory: clinically clear Abdomen: of nontender nondistended no rebound Skinno edema Neurois intact  Data Reviewed: Basic Metabolic Panel:  Recent Labs Lab 03/15/13 0825 03/16/13 0440  NA 140 138  K 3.8 3.6  CL 103 104  CO2 26 23  GLUCOSE 116* 86  BUN 13 15  CREATININE 0.79 0.91  CALCIUM 9.7 9.4   Liver Function Tests:  Recent Labs Lab 03/15/13 1610 03/16/13 0440  AST 57* 47*  ALT 28 24  ALKPHOS 132* 123*  BILITOT 0.6 0.6  PROT 8.5* 7.8  ALBUMIN 4.1 3.5    Recent Labs Lab 03/15/13 0825 03/15/13 1610  LIPASE 51 50   No results found for this basename: AMMONIA,  in the last 168 hours CBC:  Recent Labs Lab 03/15/13 0825 03/16/13 0440 03/17/13 0540  WBC 8.2 7.7 6.7  NEUTROABS 6.4  --   --   HGB 13.4 13.7 14.1  HCT  38.8 40.2 42.2  MCV 89.2 89.5 90.9  PLT 189 199 196   Cardiac Enzymes:  Recent Labs Lab 03/15/13 1610 03/15/13 2152 03/16/13 0440  TROPONINI <0.30 0.48* 0.44*   BNP: No components found with this basename: POCBNP,  CBG: No results found for this basename: GLUCAP,  in the last 168 hours  No results found for this or any previous visit (from the past 240 hour(s)).   Studies:              All Imaging reviewed and is as per above notation    Scheduled Meds: . diltiazem  120 mg Oral Daily  . metoprolol  50 mg Oral BID  . sodium chloride  3 mL Intravenous Q12H  . sodium chloride  3 mL Intravenous Q12H  . warfarin  5 mg Oral ONCE-1800  . Warfarin - Pharmacist Dosing Inpatient   Does not apply q1800   Continuous Infusions: . heparin 850 Units/hr (03/16/13 1245)     Assessment/Plan: 1. Nausea vomiting-unclear etiology-potentially migrainous? This seems to have resolved presently. Abdominal ultrasound 03/17/2013 does not show any obstruction or further issues. 2. Elevated troponin-likely rate related versus blood pressure related. Cardiology working up planned for rate control.  She will continue metoprolol 50 twice a day, diltiazem 120 daily 3. Chronic Atrial fibrillation on Coumadin CHad2Vasc2 score~4-she needs cautious Coumadin reimplementation for multiple reasons inclusive of mitral valve replacement, sick sinus.  Continue for now Cardizem 120 mg daily, metoprolol 50 twice a day.she's been pushed back to therapeutic INR of 2.0 prior to discharge-INR currently 1.7 4. Chronic mitral valve, INR goal 2.0-2.5 secondary to history subdural, liver hemorrhage   Code Status: full Family Communication:none at bedside Disposition Plan: inpatient   Pleas Koch, MD  [facility] Pager [phone] 03/17/2013, 4:57 PM    LOS: 2 days

## 2013-03-17 NOTE — Progress Notes (Signed)
[facility]  Subjective: No further N/V. No lightheadedness or chest pain.  Objective: Vital signs in last 24 hours: Temp:  [96.6 F (35.9 C)-97.6 F (36.4 C)] 97.6 F (36.4 C) (06/27 0752) Pulse Rate:  [64-75] 75 (06/27 0752) Resp:  [17-18] 18 (06/27 0752) BP: (124-159)/(70-90) 151/80 mmHg (06/27 0752) SpO2:  [96 %-99 %] 97 % (06/27 0752) Last BM Date: 03/16/13  Intake/Output from previous day: 06/26 0701 - 06/27 0700 In: 480 [P.O.:480] Out: 350 [Urine:350] Intake/Output this shift:    Medications Current Facility-Administered Medications  Medication Dose Route Frequency Provider Last Rate Last Dose  . 0.9 %  sodium chloride infusion  250 mL Intravenous PRN Richarda Overlie, MD      . acetaminophen (TYLENOL) tablet 650 mg  650 mg Oral Q6H PRN Richarda Overlie, MD       Or  . acetaminophen (TYLENOL) suppository 650 mg  650 mg Rectal Q6H PRN Richarda Overlie, MD      . diltiazem (CARDIZEM CD) 24 hr capsule 120 mg  120 mg Oral Daily Richarda Overlie, MD   120 mg at 03/16/13 1040  . heparin ADULT infusion 100 units/mL (25000 units/250 mL)  850 Units/hr Intravenous Continuous Gwenlyn Found Carney, RPH 8.5 mL/hr at 03/16/13 1245 850 Units/hr at 03/16/13 1245  . levalbuterol (XOPENEX) nebulizer solution 0.63 mg  0.63 mg Nebulization Q6H PRN Richarda Overlie, MD      . LORazepam (ATIVAN) tablet 0.5 mg  0.5 mg Oral QHS PRN Richarda Overlie, MD      . metoprolol (LOPRESSOR) tablet 50 mg  50 mg Oral BID Richarda Overlie, MD   50 mg at 03/16/13 2135  . ondansetron (ZOFRAN) tablet 4 mg  4 mg Oral Q6H PRN Richarda Overlie, MD       Or  . ondansetron (ZOFRAN) injection 4 mg  4 mg Intravenous Q6H PRN Richarda Overlie, MD      . sodium chloride 0.9 % injection 3 mL  3 mL Intravenous Q12H Nayana Abrol, MD      . sodium chloride 0.9 % injection 3 mL  3 mL Intravenous Q12H Nayana Abrol, MD      . sodium chloride 0.9 % injection 3 mL  3 mL Intravenous PRN Richarda Overlie, MD      . zolpidem (AMBIEN)  tablet 5 mg  5 mg Oral QHS PRN Richarda Overlie, MD        PE: General appearance: alert, cooperative and no distress Lungs: clear to auscultation bilaterally Heart: irregularly irregular rhythm Extremities: no LEE Pulses: 2+ and symmetric Skin: warm and dry Neurologic: Grossly normal  Lab Results:   Recent Labs  03/15/13 0825 03/16/13 0440 03/17/13 0540  WBC 8.2 7.7 6.7  HGB 13.4 13.7 14.1  HCT 38.8 40.2 42.2  PLT 189 199 196   BMET  Recent Labs  03/15/13 0825 03/16/13 0440  NA 140 138  K 3.8 3.6  CL 103 104  CO2 26 23  GLUCOSE 116* 86  BUN 13 15  CREATININE 0.79 0.91  CALCIUM 9.7 9.4   PT/INR  Recent Labs  03/15/13 0825 03/16/13 1056 03/17/13 0540  LABPROT 20.1* 20.9* 19.7*  INR 1.78* 1.86* 1.72*    Assessment/Plan  Active Problems:   S/P mitral valve replacement, St Jude   Presence of permanent cardiac pacemaker   Chronic anticoagulation, ( INR goal 2.0-2.5 due to history of subdural hematoma and liver hemorrhage)   Chronic atrial fibrillation   Hemorrhage, hepatic, Rx'd with Coumadin reversal  and embolisation 03/17/12   SAH (subarachnoid hemorrhage), December 2012  Plan:  INR is subtherapeutic at 1.72. It appears that warfarin was temporarily held. ? If this was in regards to possible LHC. Pt was seen by Dr. Allyson Sabal yesterday, who felt bump in troponin was not due to ACS. Will need to resume warfarin to achieve INR goal of 2.0-2.5. ? Switching from Cardizem to Amlodipine for BP control. See progress note from 6/26 for reasoning. Will discuss MD.   Note: At least 15 minutes was devoted to patient education. Pt had questions about atrial fibrillation, rate control medications and warfarin for stroke prophylaxis. Will also place patient education material in pt's discharge instructions.    LOS: 2 days    Brittainy M. Sharol Harness, PA-C 03/17/2013 9:01 AM

## 2013-03-18 LAB — CBC
Hemoglobin: 13.5 g/dL (ref 12.0–15.0)
MCH: 30.5 pg (ref 26.0–34.0)
MCHC: 34.3 g/dL (ref 30.0–36.0)
Platelets: 181 10*3/uL (ref 150–400)
RDW: 15.5 % (ref 11.5–15.5)

## 2013-03-18 LAB — PROTIME-INR: Prothrombin Time: 17.5 seconds — ABNORMAL HIGH (ref 11.6–15.2)

## 2013-03-18 MED ORDER — WARFARIN SODIUM 5 MG PO TABS
5.0000 mg | ORAL_TABLET | Freq: Once | ORAL | Status: AC
  Administered 2013-03-18: 5 mg via ORAL
  Filled 2013-03-18: qty 1

## 2013-03-18 NOTE — Progress Notes (Signed)
ANTICOAGULATION CONSULT NOTE - Follow-up Consult  Pharmacy Consult for Heparin ; resume Coumadin 6/27 Indication: chest pain/ACS  Allergies  Allergen Reactions  . Codeine Nausea And Vomiting    Patient Measurements: Height: 5\' 1"  (154.9 cm) Weight: 138 lb 11.2 oz (62.914 kg) IBW/kg (Calculated) : 47.8 Stated: Ht 61 in  Wt: 145 lb (66 kg)  IBW: 48 kg Heparin Dosing Weight: 61 kg (based on stated wt)  Vital Signs: Temp: 98 F (36.7 C) (06/28 0422) Temp src: Oral (06/28 0422) BP: 154/97 mmHg (06/28 0422) Pulse Rate: 62 (06/28 0422)  Labs:  Recent Labs  03/15/13 0825 03/15/13 1610  03/15/13 2152 03/16/13 0440 03/16/13 1056 03/16/13 2109 03/17/13 0540 03/18/13 0345  HGB 13.4  --   --   --  13.7  --   --  14.1 13.5  HCT 38.8  --   --   --  40.2  --   --  42.2 39.4  PLT 189  --   --   --  199  --   --  196 181  LABPROT 20.1*  --   --   --   --  20.9*  --  19.7* 17.5*  INR 1.78*  --   --   --   --  1.86*  --  1.72* 1.48  HEPARINUNFRC  --   --   < >  --  0.49 0.73* 0.44 0.55 0.10*  CREATININE 0.79  --   --   --  0.91  --   --   --   --   TROPONINI  --  <0.30  --  0.48* 0.44*  --   --   --   --   < > = values in this interval not displayed.  Estimated Creatinine Clearance: 48.2 ml/min (by C-G formula based on Cr of 0.91).   Medical History: Past Medical History  Diagnosis Date  . Hypertension   . Atrial fibrillation     normal coronaries cath 2004. Dr Clarene [facility]   . Sick sinus syndrome     Dr Sharrell Ku. EP study negative. Pacemaker 12/15/06 [employer]  . Hyperlipidemia   . Anxiety   . Diverticula of colon   . Hemorrhage intraabdominal 03/17/2012  . 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 Jude mechanical prosthesis. LA severe dilated  . Subdural hematoma   . Liver hemorrhage   . Atrial flutter   . Pacemaker   . Heart murmur   . CHF (congestive heart failure)   . Pneumonia 2009    resolved.? OPD Rx  . Exertional shortness of breath    . History of blood transfusion     "once" (03/15/2013)  . GERD (gastroesophageal reflux disease)   . Migraines   . Stroke     "they say I had a stroke last year" denies residual on 03/15/2013  . Arthritis     "right shoulder" (03/15/2013)    Medications:  . heparin 850 Units/hr (03/16/13 1245)    Assessment: 72 y.o. female presents with N/V - non-bloody. Pt on coumadin PTA for afib and St Jude MVR.   Noted pt with positive troponins so heparin started for r/o ACS. CBC ok at baseline.  Home Coumadin dose 5 mg daily except 2.5 mg on Sun, Wed.   Last Coumadin dose given at home 6/24. INR (1.48) and heparin level (0.1) are both below-goal. No problem with line / infusion and no bleeding per RN.   Goal of Therapy:  Heparin level 0.3-0.7 units/ml INR goal 2-2.5 per cardiology note secondary to history subdural, liver hemorrhage Monitor platelets by anticoagulation protocol: Yes   Plan:  1. Coumadin 5mg  po today. 2. Increase IV heparin to 1050 units/hr.  3. Heparin level in 6 hours.   Lorre Munroe, PharmD  03/18/2013 5:38 AM

## 2013-03-18 NOTE — Progress Notes (Signed)
ANTICOAGULATION CONSULT NOTE - Follow-up Consult  Pharmacy Consult for Heparin Indication: chest pain/ACS, atrial fibrillation, St. Jude MVR  Allergies  Allergen Reactions  . Codeine Nausea And Vomiting    Patient Measurements: Height: 5\' 1"  (154.9 cm) Weight: 138 lb 11.2 oz (62.914 kg) IBW/kg (Calculated) : 47.8 Stated: Ht 61 in  Wt: 145 lb (66 kg)  IBW: 48 kg Heparin Dosing Weight: 61 kg (based on stated wt)  Vital Signs: Temp: 98 F (36.7 C) (06/28 1321) Temp src: Oral (06/28 1321) BP: 143/85 mmHg (06/28 1321) Pulse Rate: 72 (06/28 1321)  Labs:  Recent Labs  03/15/13 1610  03/15/13 2152  03/16/13 0440 03/16/13 1056  03/17/13 0540 03/18/13 0345 03/18/13 1200  HGB  --   --   --   < > 13.7  --   --  14.1 13.5  --   HCT  --   --   --   --  40.2  --   --  42.2 39.4  --   PLT  --   --   --   --  199  --   --  196 181  --   LABPROT  --   --   --   --   --  20.9*  --  19.7* 17.5*  --   INR  --   --   --   --   --  1.86*  --  1.72* 1.48  --   HEPARINUNFRC  --   < >  --   --  0.49 0.73*  < > 0.55 0.10* 0.26*  CREATININE  --   --   --   --  0.91  --   --   --   --   --   TROPONINI <0.30  --  0.48*  --  0.44*  --   --   --   --   --   < > = values in this interval not displayed.  Estimated Creatinine Clearance: 48.2 ml/min (by C-G formula based on Cr of 0.91).  Medications:  Heparin drip 1050 units/hr  Assessment: 72 y.o. female presents with N/V - non-bloody. Pt on Coumadin PTA for Afib and St Jude MVR. Noted pt with positive troponins so heparin started for r/o ACS. Home Coumadin dose 5 mg daily except 2.5 mg on Sun, Wed. Heparin level is below goal at 0.26. No problem with line / infusion and no bleeding per RN, CBC stable.   Goal of Therapy:  Heparin level 0.3-0.7 units/ml INR goal 2-2.5 per cardiology note secondary to history subdural, liver hemorrhage Monitor platelets by anticoagulation protocol: Yes   Plan:  1. Increase IV heparin to 1150 units/hr 2. Heparin  level in 8 hours 3. Daily heparin level and CBC 4. Monitor for signs/symptoms of bleeding  [facility], [address].D., BCPS Clinical Pharmacist Pager: [phone] 03/18/2013 2:02 PM

## 2013-03-18 NOTE — Progress Notes (Signed)
ANTICOAGULATION CONSULT NOTE - Follow-up Consult  Pharmacy Consult for Heparin Indication: chest pain/ACS, atrial fibrillation, St. Jude MVR  Allergies  Allergen Reactions  . Codeine Nausea And Vomiting    Patient Measurements: Height: 5\' 1"  (154.9 cm) Weight: 138 lb 11.2 oz (62.914 kg) IBW/kg (Calculated) : 47.8 Stated: Ht 61 in  Wt: 145 lb (66 kg)  IBW: 48 kg Heparin Dosing Weight: 61 kg (based on stated wt)  Vital Signs: Temp: 97.8 F (36.6 C) (06/28 1958) Temp src: Oral (06/28 1958) BP: 137/74 mmHg (06/28 1958) Pulse Rate: 62 (06/28 1958)  Labs:  Recent Labs  03/16/13 0440 03/16/13 1056  03/17/13 0540 03/18/13 0345 03/18/13 1200 03/18/13 2240  HGB 13.7  --   --  14.1 13.5  --   --   HCT 40.2  --   --  42.2 39.4  --   --   PLT 199  --   --  196 181  --   --   LABPROT  --  20.9*  --  19.7* 17.5*  --   --   INR  --  1.86*  --  1.72* 1.48  --   --   HEPARINUNFRC 0.49 0.73*  < > 0.55 0.10* 0.26* 0.73*  CREATININE 0.91  --   --   --   --   --   --   TROPONINI 0.44*  --   --   --   --   --   --   < > = values in this interval not displayed.  Estimated Creatinine Clearance: 48.2 ml/min (by C-G formula based on Cr of 0.91).  Medications:  Heparin drip 1050 units/hr  Assessment: 72 y.o. female presents with N/V - non-bloody. Pt on Coumadin PTA for Afib and St Jude MVR. Noted pt with positive troponins so heparin started for r/o ACS. Home Coumadin dose 5 mg daily except 2.5 mg on Sun, Wed. Heparin level is above-goal at 0.26 on 1150 units/hr. No problem with line / infusion and no bleeding per RN.   Goal of Therapy:  Heparin level 0.3-0.7 units/ml INR goal 2-2.5 per cardiology note secondary to history subdural, liver hemorrhage Monitor platelets by anticoagulation protocol: Yes   Plan:  1. Decrease IV heparin to 1100 units/hr 2. Heparin level in 8 hours 3. Daily heparin level and CBC  Lorre Munroe, PharmD 03/18/2013 11:21 PM

## 2013-03-18 NOTE — Progress Notes (Addendum)
PROGRESS NOTE  Carrie [name]:[id number] DOB: October 24, 1940 DOA: 03/15/2013 PCP: Geraldo Pitter, MD  Brief narrative: 72 yr female, known mitral valve replacement St. Jude, permanent pacemaker, chronic atrial fibrillation, hepatic hemorrhage and subarachnoid hemorrhage 03/17/2012, December 2012 respectively admitted 03/15/2013 with nausea vomiting, nonbloody nonbilious no sick contacts recent illnesses or other issues. Noted to have initial elevated troponin 0.2, lactic acid 2.77. Cardiology consulted regarding elevated troponin-patient placed on heparin drip, troponins were cycled and EKG showed deep T-wave ST-T wave changes with intermittent pacing. Heparin ultimately stopped and decided to transition back to Coumadin as this was not thought to be ACS related.  Past medical history-As per Problem list Chart reviewed as below-  Seen by Dr. 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  Admission 03/16/2012 4 hepatic hemorrhage, had arteriogram and embolization with reversal of her INR transfusion and reimplantation of her anticoagulation  Admission 09/06/2011 for streptococcal sepsis with possibility of endocarditis, acute encephalopathy, subarachnoid hemorrhage  Admission 12/15/2006 and 02/22/2007 4 heparin Coumadin cross cover for permanent pacemaker placement, MV replacement 1998, sick sinus syndrome, syncope in the past with prior loop recorder  Admission 11/09/2005 for near syncopal spell status post loop recorder  [place] 09/05/2003 for syncope, positive tilt table, PAF  Notable presumed history rheumatic fever as a child was seen showed mitral valve repair in 1998  Consultants:  Cardiology  Procedures:  Two-view chest x-ray 6/25  Ultrasound abdomen 6/25   CT head 6/25  Antibiotics:  None currently   Subjective  Doing well. Husband, Brother at bedside No c/o   Objective    Interim History: nad   Telemetry: Sinus  rhythm  Objective: Filed Vitals:   03/17/13 1605 03/17/13 2100 03/18/13 0422 03/18/13 1321  BP: 132/77 145/77 154/97 143/85  Pulse: 68 76 62 72  Temp: 97 F (36.1 C) 97.7 F (36.5 C) 98 F (36.7 C) 98 F (36.7 C)  TempSrc: Oral Oral Oral Oral  Resp:   19 18  Height:      Weight:      SpO2: 98% 97% 98% 96%   No intake or output data in the 24 hours ending 03/18/13 2000  Exam:  General: alert very pleasant oriented after female looking younger than stated Cardiovascular: and S2 no murmur rub or Respiratory: clinically clear Abdomen: of nontender nondistended no rebound Skinno edema Neurois intact  Data Reviewed: Basic Metabolic Panel:  Recent Labs Lab 03/15/13 0825 03/16/13 0440  NA 140 138  K 3.8 3.6  CL 103 104  CO2 26 23  GLUCOSE 116* 86  BUN 13 15  CREATININE 0.79 0.91  CALCIUM 9.7 9.4   Liver Function Tests:  Recent Labs Lab 03/15/13 1610 03/16/13 0440  AST 57* 47*  ALT 28 24  ALKPHOS 132* 123*  BILITOT 0.6 0.6  PROT 8.5* 7.8  ALBUMIN 4.1 3.5    Recent Labs Lab 03/15/13 0825 03/15/13 1610  LIPASE 51 50   No results found for this basename: AMMONIA,  in the last 168 hours CBC:  Recent Labs Lab 03/15/13 0825 03/16/13 0440 03/17/13 0540 03/18/13 0345  WBC 8.2 7.7 6.7 7.4  NEUTROABS 6.4  --   --   --   HGB 13.4 13.7 14.1 13.5  HCT 38.8 40.2 42.2 39.4  MCV 89.2 89.5 90.9 89.1  PLT 189 199 196 181   Cardiac Enzymes:  Recent Labs Lab 03/15/13 1610 03/15/13 2152 03/16/13 0440  TROPONINI <0.30 0.48* 0.44*   BNP: No components found  with this basename: POCBNP,  CBG: No results found for this basename: GLUCAP,  in the last 168 hours  No results found for this or any previous visit (from the past 240 hour(s)).   Studies:              All Imaging reviewed and is as per above notation   Scheduled Meds: . diltiazem  120 mg Oral Daily  . metoprolol  50 mg Oral BID  . sodium chloride  3 mL Intravenous Q12H  . sodium chloride  3  mL Intravenous Q12H  . Warfarin - Pharmacist Dosing Inpatient   Does not apply q1800   Continuous Infusions: . heparin 1,150 Units/hr (03/18/13 1425)     Assessment/Plan: 1. Nausea vomiting-unclear etiology-potentially migrainous? This seems to have resolved presently. Abdominal ultrasound 03/17/2013 does not show any obstruction or further issues. 2. Elevated troponin-likely rate related versus blood pressure related. Marland Kitchen  She will continue metoprolol 50 twice a day, diltiazem 120 daily 3. Chronic Atrial fibrillation on Coumadin CHad2Vasc2 score~4-she needs cautious Coumadin reimplementation for multiple reasons inclusive of mitral valve replacement, sick sinus.  Continue for now Cardizem 120 mg daily, metoprolol 50 twice a day.Will consider ? Lovenox in am if INR not therapeutic.  She is very stable otherwise 4. Chronic mitral valve, INR goal 2.0-2.5 secondary to history subdural, liver hemorrhage   Code Status: full Family Communication:none at bedside Disposition Plan: inpatient   Pleas Koch, MD  [facility] Pager [phone] 03/18/2013, 8:00 PM    LOS: 3 days

## 2013-03-19 LAB — CBC
HCT: 39.5 % (ref 36.0–46.0)
Hemoglobin: 13.9 g/dL (ref 12.0–15.0)
MCHC: 35.2 g/dL (ref 30.0–36.0)

## 2013-03-19 LAB — PROTIME-INR: INR: 1.48 (ref 0.00–1.49)

## 2013-03-19 LAB — HEPARIN LEVEL (UNFRACTIONATED): Heparin Unfractionated: 0.59 IU/mL (ref 0.30–0.70)

## 2013-03-19 MED ORDER — WARFARIN SODIUM 5 MG PO TABS
5.0000 mg | ORAL_TABLET | Freq: Once | ORAL | Status: AC
  Administered 2013-03-19: 5 mg via ORAL
  Filled 2013-03-19: qty 1

## 2013-03-19 NOTE — Progress Notes (Signed)
ANTICOAGULATION CONSULT NOTE - Follow-up Consult  Pharmacy Consult for Heparin and Coumadin Indication: chest pain/ACS, atrial fibrillation, St. Jude MVR  Allergies  Allergen Reactions  . Codeine Nausea And Vomiting    Patient Measurements: Height: 5\' 1"  (154.9 cm) Weight: 138 lb 11.2 oz (62.914 kg) IBW/kg (Calculated) : 47.8 Stated: Ht 61 in  Wt: 145 lb (66 kg)  IBW: 48 kg Heparin Dosing Weight: 61 kg (based on stated wt)  Vital Signs: Temp: 98.1 F (36.7 C) (06/29 0451) Temp src: Oral (06/29 0451) BP: 124/70 mmHg (06/29 0451) Pulse Rate: 61 (06/29 0451)  Labs:  Recent Labs  03/17/13 0540 03/18/13 0345 03/18/13 1200 03/18/13 2240 03/19/13 0345  HGB 14.1 13.5  --   --  13.9  HCT 42.2 39.4  --   --  39.5  PLT 196 181  --   --  208  LABPROT 19.7* 17.5*  --   --  17.5*  INR 1.72* 1.48  --   --  1.48  HEPARINUNFRC 0.55 0.10* 0.26* 0.73* 0.59    Estimated Creatinine Clearance: 48.2 ml/min (by C-G formula based on Cr of 0.91).  Medications:  . heparin 1,100 Units/hr (03/19/13 0305)   Assessment: 72 y.o. female presents with N/V - non-bloody. Pt on Coumadin PTA for Afib and St Jude MVR. Noted pt with positive troponins so heparin started for r/o ACS. Home Coumadin dose 5 mg daily except 2.5 mg on Sun, Wed. Heparin level is therapeutic at 0.59. No bleeding noted, CBC stable. INR is subtherapeutic at 1.48.   Goal of Therapy:  Heparin level 0.3-0.7 units/ml INR goal 2-2.5 per cardiology note secondary to history subdural, liver hemorrhage Monitor platelets by anticoagulation protocol: Yes   Plan:  1. Continue IV heparin at 1100 units/hr 2. Daily heparin level, INR, and CBC 3. Coumadin 5 mg PO x1 4. Monitor for signs/symptoms of bleeding  [facility], [address].D., BCPS Clinical Pharmacist Pager: [phone] 03/19/2013 10:40 AM

## 2013-03-20 ENCOUNTER — Telehealth: Payer: Self-pay | Admitting: Cardiovascular Disease

## 2013-03-20 LAB — PROTIME-INR
INR: 1.67 — ABNORMAL HIGH (ref 0.00–1.49)
Prothrombin Time: 19.2 seconds — ABNORMAL HIGH (ref 11.6–15.2)

## 2013-03-20 LAB — CBC
MCV: 89.8 fL (ref 78.0–100.0)
Platelets: 191 10*3/uL (ref 150–400)
RBC: 4.43 MIL/uL (ref 3.87–5.11)
WBC: 8.4 10*3/uL (ref 4.0–10.5)

## 2013-03-20 LAB — HEPARIN LEVEL (UNFRACTIONATED)
Heparin Unfractionated: 1.93 IU/mL — ABNORMAL HIGH (ref 0.30–0.70)
Heparin Unfractionated: 1.39 IU/mL — ABNORMAL HIGH (ref 0.30–0.70)

## 2013-03-20 MED ORDER — HEPARIN (PORCINE) IN NACL 100-0.45 UNIT/ML-% IJ SOLN
900.0000 [IU]/h | INTRAMUSCULAR | Status: DC
Start: 2013-03-20 — End: 2013-03-20
  Administered 2013-03-20: 900 [IU]/h via INTRAVENOUS
  Filled 2013-03-20: qty 250

## 2013-03-20 MED ORDER — HEPARIN (PORCINE) IN NACL 100-0.45 UNIT/ML-% IJ SOLN
800.0000 [IU]/h | INTRAMUSCULAR | Status: DC
  Filled 2013-03-20: qty 250

## 2013-03-20 MED ORDER — WARFARIN SODIUM 6 MG PO TABS
6.0000 mg | ORAL_TABLET | Freq: Once | ORAL | Status: AC
  Administered 2013-03-20: 6 mg via ORAL
  Filled 2013-03-20: qty 1

## 2013-03-20 NOTE — Progress Notes (Signed)
Per pharmacist Gary Fleet heparin drip is discontinued at 05:32 Am

## 2013-03-20 NOTE — Progress Notes (Signed)
ANTICOAGULATION CONSULT NOTE - Follow-up Consult  Pharmacy Consult for Heparin Indication: chest pain/ACS, atrial fibrillation, St. Jude MVR  Allergies  Allergen Reactions  . Codeine Nausea And Vomiting    Patient Measurements: Height: 5\' 1"  (154.9 cm) Weight: 138 lb 11.2 oz (62.914 kg) IBW/kg (Calculated) : 47.8 Stated: Ht 61 in  Wt: 145 lb (66 kg)  IBW: 48 kg Heparin Dosing Weight: 61 kg (based on stated wt)  Vital Signs: Temp: 98 F (36.7 C) (06/30 0412) Temp src: Oral (06/30 0412) BP: 139/73 mmHg (06/30 0412) Pulse Rate: 67 (06/30 0412)  Labs:  Recent Labs  03/18/13 0345  03/18/13 2240 03/19/13 0345 03/20/13 0425  HGB 13.5  --   --  13.9 14.0  HCT 39.4  --   --  39.5 39.8  PLT 181  --   --  208 191  LABPROT 17.5*  --   --  17.5* 19.2*  INR 1.48  --   --  1.48 1.67*  HEPARINUNFRC 0.10*  < > 0.73* 0.59 1.93*  < > = values in this interval not displayed.  Estimated Creatinine Clearance: 48.2 ml/min (by C-G formula based on Cr of 0.91).  Assessment: 72 y.o. female with St. Judes MVR and Afib for anticoagulation.  Heparin level this morning significantly elevated.  Level appears to have been drawn correctly and infusion running appropriately.  IV site where heparin was infusing yesterday infiltrated and patient's arm is swollen at that site.  Infiltrated heparin absorbing into the bloodstream may be causing elevated level.  Goal of Therapy:  Heparin level 0.3-0.7 units/ml Monitor platelets by anticoagulation protocol: Yes   Plan:  Hold heparin x 90 minutes, then decrease heparin 900 units/hr F/U level  Geannie Risen, PharmD, BCPS   03/20/2013 5:35 AM

## 2013-03-20 NOTE — Telephone Encounter (Signed)
Was trying to find out who called pt!

## 2013-03-20 NOTE — Progress Notes (Signed)
ANTICOAGULATION CONSULT NOTE - Follow Up Consult  Pharmacy Consult for Heparin/Coumadin Indication: Afib, St Jude MVR  Allergies  Allergen Reactions  . Codeine Nausea And Vomiting    Patient Measurements: Height: 5\' 1"  (154.9 cm) Weight: 138 lb 11.2 oz (62.914 kg) IBW/kg (Calculated) : 47.8 Heparin Dosing Weight:   Vital Signs: Temp: 98 F (36.7 C) (06/30 0412) Temp src: Oral (06/30 0412) BP: 139/73 mmHg (06/30 0412) Pulse Rate: 67 (06/30 0412)  Labs:  Recent Labs  03/18/13 0345  03/18/13 2240 03/19/13 0345 03/20/13 0425  HGB 13.5  --   --  13.9 14.0  HCT 39.4  --   --  39.5 39.8  PLT 181  --   --  208 191  LABPROT 17.5*  --   --  17.5* 19.2*  INR 1.48  --   --  1.48 1.67*  HEPARINUNFRC 0.10*  < > 0.73* 0.59 1.93*  < > = values in this interval not displayed.  Estimated Creatinine Clearance: 48.2 ml/min (by C-G formula based on Cr of 0.91).   Assessment: N/V  Anticoagulation: Coumadin PTA for afib, St Jude MVR. Baseline INR subtherapeutic 1.48 (goal per Berry's note 6/25 says 2-2.5 due to hx subdural hematoma and liver hemorrhage). Noted pt with positive troponins and EKG changes so heparin started for ACS. CBC stable. HL increased from 0.59 to 1.93 overnight. Level 1.93 INfusing OK and drawn opp arm per RN. IV infiltrated yesterday and arm swollen. Heparin absorbing and elevating level? Held 0530 to around 0730 this am per RN. INR up to 1.67. Home Coumadin dose: 5 mg daily except 2.5 mg on Sun, Wed  Cardiovascular: HTN, HLD, afib, PPM for SSS, St Jude MVR. Troponins mildly elevated. Meds: diltiazem, metoprolol. 139/73, HR 67  Endocrinology: glucoses WNL  Gastrointestinal / Nutrition: H/o spontaneous hemorrhage from liver 02/2012 - resolved.   Neurology: anxiety  Nephrology: Scr 0.91; CrCl ~ 50 ml/min  Pulmonary: 97% RA  Hematology / Oncology: CBC WNL  PTA Medication Issues: resumed appropriately  Best Practices: heparin  Goal of Therapy:  Heparin level  0.3-0.7 units/ml INR 2-3=2.5 Monitor platelets by anticoagulation protocol: Yes   Plan:  Heparin held x 2 hrs this am. Resumed at 900 uts/hr. Recheck this afternoon. Coumadin 6mg  po x 1 tonight    Carrie Brander S. Merilynn Finland, PharmD, BCPS Clinical Staff Pharmacist Pager [phone] Carrie [name] 03/20/2013,8:27 AM

## 2013-03-20 NOTE — Progress Notes (Signed)
Anticoagulation consult Note: Heparin Indication: Chest pain/ACS/a.fib/St Jude MVR  Goal Heparin Level: 0.3-0.7  Heparin level = 1.39 on 900 units/hr  Heparin level is higher than desired range.No s/s of bleeding.  Will hold heparin for 1 hr, then restart at 800 units/hr. Will recheck heparin level with AM labs.  Cardell Peach, PharmD

## 2013-03-20 NOTE — Progress Notes (Signed)
PROGRESS NOTE  Carrie [name] ZOX:[id number] DOB: 1941/09/03 DOA: 03/15/2013 PCP: Geraldo Pitter, MD  Brief narrative: 72 yr female, known mitral valve replacement St. Jude, permanent pacemaker, chronic atrial fibrillation, hepatic hemorrhage and subarachnoid hemorrhage 03/17/2012, December 2012 respectively admitted 03/15/2013 with nausea vomiting, nonbloody nonbilious no sick contacts recent illnesses or other issues. Noted to have initial elevated troponin 0.2, lactic acid 2.77. Cardiology consulted regarding elevated troponin-patient placed on heparin drip, troponins were cycled and EKG showed deep T-wave ST-T wave changes with intermittent pacing. Heparin ultimately transitioned back to Coumadin as this was not thought to be ACS related.  Past medical history-As per Problem list Chart reviewed as below-  Seen by Dr. 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  Admission 03/16/2012 4 hepatic hemorrhage, had arteriogram and embolization with reversal of her INR transfusion and reimplantation of her anticoagulation  Admission 09/06/2011 for streptococcal sepsis with possibility of endocarditis, acute encephalopathy, subarachnoid hemorrhage  Admission 12/15/2006 and 02/22/2007 4 heparin Coumadin cross cover for permanent pacemaker placement, MV replacement 1998, sick sinus syndrome, syncope in the past with prior loop recorder  Admission 11/09/2005 for near syncopal spell status post loop recorder  [place] 09/05/2003 for syncope, positive tilt table, PAF  Notable presumed history rheumatic fever as a child was seen showed mitral valve repair in 1998  Consultants:  Cardiology  Procedures:  Two-view chest x-ray 6/25  Ultrasound abdomen 6/25   CT head 6/25  Antibiotics:  None currently   Subjective  Doing well. Ambulance on the floor no apparent distress doing fair otherwise   Objective    Interim History: nad   Telemetry: Sinus  rhythm  Objective: Filed Vitals:   03/19/13 0451 03/19/13 1416 03/19/13 2009 03/20/13 0412  BP: 124/70 126/61 137/77 139/73  Pulse: 61 63 70 67  Temp: 98.1 F (36.7 C) 97.6 F (36.4 C) 97.5 F (36.4 C) 98 F (36.7 C)  TempSrc: Oral Oral Oral Oral  Resp: 20 20 21 19   Height:      Weight:      SpO2: 96% 99% 97% 98%    Intake/Output Summary (Last 24 hours) at 03/20/13 0905 Last data filed at 03/19/13 1800  Gross per 24 hour  Intake 548.68 ml  Output      0 ml  Net 548.68 ml    Exam:  General: alert very pleasant oriented after female looking younger than stated Cardiovascular: and S2 no murmur rub or Respiratory: clinically clear Abdomen: of nontender nondistended no rebound Skinno edema Neurois intact  Data Reviewed: Basic Metabolic Panel:  Recent Labs Lab 03/15/13 0825 03/16/13 0440  NA 140 138  K 3.8 3.6  CL 103 104  CO2 26 23  GLUCOSE 116* 86  BUN 13 15  CREATININE 0.79 0.91  CALCIUM 9.7 9.4   Liver Function Tests:  Recent Labs Lab 03/15/13 1610 03/16/13 0440  AST 57* 47*  ALT 28 24  ALKPHOS 132* 123*  BILITOT 0.6 0.6  PROT 8.5* 7.8  ALBUMIN 4.1 3.5    Recent Labs Lab 03/15/13 0825 03/15/13 1610  LIPASE 51 50   No results found for this basename: AMMONIA,  in the last 168 hours CBC:  Recent Labs Lab 03/15/13 0825 03/16/13 0440 03/17/13 0540 03/18/13 0345 03/19/13 0345 03/20/13 0425  WBC 8.2 7.7 6.7 7.4 7.4 8.4  NEUTROABS 6.4  --   --   --   --   --   HGB 13.4 13.7 14.1 13.5 13.9 14.0  HCT  38.8 40.2 42.2 39.4 39.5 39.8  MCV 89.2 89.5 90.9 89.1 89.0 89.8  PLT 189 199 196 181 208 191   Cardiac Enzymes:  Recent Labs Lab 03/15/13 1610 03/15/13 2152 03/16/13 0440  TROPONINI <0.30 0.48* 0.44*   BNP: No components found with this basename: POCBNP,  CBG: No results found for this basename: GLUCAP,  in the last 168 hours  No results found for this or any previous visit (from the past 240 hour(s)).   Studies:               All Imaging reviewed and is as per above notation   Scheduled Meds: . diltiazem  120 mg Oral Daily  . metoprolol  50 mg Oral BID  . sodium chloride  3 mL Intravenous Q12H  . sodium chloride  3 mL Intravenous Q12H  . warfarin  6 mg Oral ONCE-1800  . Warfarin - Pharmacist Dosing Inpatient   Does not apply q1800   Continuous Infusions: . heparin 900 Units/hr (03/20/13 0739)     Assessment/Plan: 1. Nausea vomiting-unclear etiology-potentially migrainous? This seems to have resolved presently. Abdominal ultrasound 03/17/2013 does not show any obstruction or further issues. 2. Elevated troponin-likely rate related versus blood pressure related. Marland Kitchen  She will continue metoprolol 50 twice a day, diltiazem 120 daily 3. Chronic Atrial fibrillation on Coumadin CHad2Vasc2 score~4-she needs cautious Coumadin reimplementation for multiple reasons inclusive of mitral valve replacement, sick sinus.  Continue for now Cardizem 120 mg daily, metoprolol 50 twice a day.discussed case with Dr. Herbie Baltimore on 6:30 and he recommends cautious heparin, Coumadin overlap as patient does have multiple risk factors and is slightly better off being monitored until INR is 1.9 4. Chronic mitral valve, INR goal 2.0-2.5 secondary to history subdural, liver hemorrhage   Code Status: full Family Communication:none at bedside Disposition Plan: inpatient   Pleas Koch, MD  [facility] Pager [phone] 03/20/2013, 9:05 AM    LOS: 5 days

## 2013-03-21 LAB — BASIC METABOLIC PANEL
BUN: 14 mg/dL (ref 6–23)
Creatinine, Ser: 0.75 mg/dL (ref 0.50–1.10)
GFR calc Af Amer: >90 mL/min (ref >90)
GFR calc non Af Amer: 83 mL/min — ABNORMAL LOW (ref >90)
Glucose, Bld: 86 mg/dL (ref 70–99)

## 2013-03-21 LAB — CBC
MCHC: 34.8 g/dL (ref 30.0–36.0)
Platelets: 192 10*3/uL (ref 150–400)
RDW: 15.8 % — ABNORMAL HIGH (ref 11.5–15.5)

## 2013-03-21 LAB — HEPARIN LEVEL (UNFRACTIONATED): Heparin Unfractionated: 0.99 IU/mL — ABNORMAL HIGH (ref 0.30–0.70)

## 2013-03-21 LAB — PROTIME-INR
INR: 1.76 — ABNORMAL HIGH (ref 0.00–1.49)
Prothrombin Time: 20 seconds — ABNORMAL HIGH (ref 11.6–15.2)

## 2013-03-21 MED ORDER — DILTIAZEM HCL ER COATED BEADS 120 MG PO CP24: 120.0000 mg | ORAL_CAPSULE | Freq: Every day | ORAL | Status: DC

## 2013-03-21 MED ORDER — WARFARIN SODIUM 7.5 MG PO TABS
7.5000 mg | ORAL_TABLET | Freq: Once | ORAL | Status: AC
  Administered 2013-03-21: 7.5 mg via ORAL
  Filled 2013-03-21: qty 1

## 2013-03-21 MED ORDER — HEPARIN (PORCINE) IN NACL 100-0.45 UNIT/ML-% IJ SOLN
650.0000 [IU]/h | INTRAMUSCULAR | Status: DC
  Filled 2013-03-21 (×2): qty 250

## 2013-03-21 NOTE — Progress Notes (Signed)
Anticoagulation consult note: Heparin. Indication: a.fib and St Jude MVR  Goal Heparin Level: 0.3-0.7   Heparin Level= 0.56 on 650 units/hr  Heparin level is finally in desired range. No change in rate. F/U heparin level and CBC in AM.  Cardell Peach, PharmD

## 2013-03-21 NOTE — Discharge Summary (Signed)
Physician Discharge Summary  Carrie [name] YQM:[id number] DOB: [name] 27, 1942 DOA: 03/15/2013  PCP: Carrie Pitter, MD  Admit date: 03/15/2013  Discharge date: 03/21/2013  Time spent: 40 minutes  Recommendations for Outpatient Follow-up:  1. Needs close followup on INR level as an outpatient with [facility]  2. Followup with Dr. Welton Flakes of oncology as an outpatient when needed   Discharge Diagnoses:  Active Problems:   S/P mitral valve replacement, St Jude   Presence of permanent cardiac pacemaker   Chronic anticoagulation, ( INR goal 2.0-2.5 due to history of subdural hematoma and liver hemorrhage)   Chronic atrial fibrillation   Hemorrhage, hepatic, Rx'd with Coumadin reversal and embolisation 03/17/12   SAH (subarachnoid hemorrhage), [name] 2012   Discharge Condition: Stable  Diet recommendation: Vitamin K controlled  Filed Weights   03/15/13 1751  Weight: 62.914 kg (138 lb 11.2 oz)    History of present illness:  72 yr female, known mitral valve replacement St. Jude, permanent pacemaker, chronic atrial fibrillation, hepatic hemorrhage and subarachnoid hemorrhage 03/17/2012, [name] 2012 respectively admitted 03/15/2013 with nausea vomiting, nonbloody nonbilious no sick contacts recent illnesses or other issues. Noted to have initial elevated troponin 0.2, lactic acid 2.77. Cardiology consulted regarding elevated troponin-patient placed on heparin drip, troponins were cycled and EKG showed deep T-wave ST-T wave changes with intermittent pacing. Heparin ultimately transitioned back to Coumadin as this was not thought to be ACS related. See below  Hospital Course:   1. Nausea vomiting-unclear etiology-potentially migrainous? This seems to have resolved presently. Abdominal ultrasound 03/17/2013 does not show any obstruction or further issues. 2. Elevated troponin-likely rate related versus blood pressure related.  She will continue metoprolol 50 twice a day, diltiazem 120 daily 3. Chronic  Atrial fibrillation on Coumadin CHad2Vasc2 score~4-she needs cautious Coumadin reimplementation for multiple reasons inclusive of mitral valve replacement, sick sinus. Continue for now Cardizem 120 mg daily, metoprolol 50 twice a day.discussed case with Dr. Herbie [name] on 6/30 and he recommends cautious heparin, Coumadin overlap as patient does have multiple risk factors and is slightly better off being monitored until INR is 1.9-2.0--- oncoming Hospitalist attending physician will update the dosage in discharge summary of Coumadin that would be the choir to keep her in the therapeutic range. Family understands that this is the safest option for her 4. Chronic mitral valve, INR goal 2.0-2.5 secondary to history subdural, liver hemorrhage 5.   Consultants:  Cardiology Procedures:  Two-view chest x-ray 6/25  Ultrasound abdomen 6/25  CT head 6/25 Antibiotics:  None currently  Discharge Exam: Filed Vitals:   03/20/13 1330 03/20/13 2117 03/21/13 0513 03/21/13 1417  BP: 120/64 139/66 137/68 132/67  Pulse: 85 64 68 78  Temp: 98.2 F (36.8 C) 97.3 F (36.3 C) 97.4 F (36.3 C) 97.6 F (36.4 C)  TempSrc: Oral Oral Oral Oral  Resp: 20 20 18 18   Height:      Weight:      SpO2: 98% 97% 100% 100%   Alert pleasant oriented No acute chest pain and walking around comfortably General: EOMI, NCAT Cardiovascular: S1-S2 no murmur rub or Respiratory: Clinically clear  Discharge Instructions   Future Appointments Provider Department Dept Phone   03/29/2013 11:30 AM Phillips Hay, RPH-CPP [facility] [place] [phone]   04/03/2013 10:20 AM Carrie Boozer, NP [facility] [place] [phone]   11/30/2013 10:30 AM Carrie [name] Idelle [facility] MEDICAL ONCOLOGY [phone]   11/30/2013 11:00 AM Carrie December, MD [facility]  MEDICAL ONCOLOGY [phone]       Medication List    STOP taking these medications        acetaminophen 325 MG tablet  Commonly known as:  TYLENOL     LORazepam 0.5 MG tablet  Commonly known as:  ATIVAN     MUCINEX DM PO     zolpidem 5 MG tablet  Commonly known as:  AMBIEN      TAKE these medications       diltiazem 120 MG 24 hr capsule  Commonly known as:  CARDIZEM CD  Take 1 capsule (120 mg total) by mouth daily.     folic acid 400 MCG tablet  Commonly known as:  FOLVITE  Take 400 mcg by mouth every other day.     metoprolol 50 MG tablet  Commonly known as:  LOPRESSOR  Take 1 tablet (50 mg total) by mouth 2 (two) times daily.     MULTIVITAMINS PO  Take by mouth daily.     OVER THE COUNTER MEDICATION  Place 1 drop into both eyes 2 (two) times daily. Drops for dry eyes     polyethylene glycol packet  Commonly known as:  MIRALAX / GLYCOLAX  Take 17 g by mouth as needed (for constipation).     warfarin 5 MG tablet  Commonly known as:  COUMADIN  Take 2.5-5 mg by mouth daily at 6 PM. Takes 2.5mg  on Sunday and Wednesday, 5 mg on Monday, Tuesday, Thursday, Friday, Saturday       Allergies  Allergen Reactions  . Codeine Nausea And Vomiting       Follow-up Information   Follow up with [facility] R, NP On 04/03/2013. (10:20 pm)    Contact information:   [address] [place] [address] [phone]        The results of significant diagnostics from this hospitalization (including imaging, microbiology, ancillary and laboratory) are listed below for reference.    Significant Diagnostic Studies: Dg Chest 2 View  03/15/2013   *RADIOLOGY REPORT*  Clinical Data: Weakness with emesis.  History of hypertension.  CHEST - 2 VIEW  Comparison: 03/22/2012 and 09/29/2011.  Findings: The left subclavian pacemaker is unchanged.  There is stable cardiomegaly status post median sternotomy and mitral valve replacement.  Overall basilar aeration has improved.  There is mild chronic vascular congestion.  No pleural effusion or pneumothorax is seen.   IMPRESSION: Overall improved aeration of the lung bases.  Stable cardiomegaly.   Original Report Authenticated By: Carey Bullocks, M.D.   Ct Head Wo Contrast  03/15/2013   *RADIOLOGY REPORT*  Clinical Data: Dizziness and tachycardia.  CT HEAD WITHOUT CONTRAST  Technique:  Contiguous axial images were obtained from the base of the skull through the vertex without contrast.  Comparison: 09/16/2011  Findings: .  No intracranial hemorrhage.  Remote left caudate head infarct.  Small vessel disease type changes without CT evidence of large acute infarct.  Global atrophy without hydrocephalus.  Coarse calcification adjacent to the right falx/tentorium unchanged (possibly a calcified meningioma) otherwise no intracranial mass lesion detected on this unenhanced exam.  Vascular calcifications.  Mastoid air cells, middle ear cavities and paranasal sinuses are clear.  Visualized aspects of the orbits unremarkable.  IMPRESSION: No intracranial hemorrhage or CT evidence of large acute infarct. Please see above.   Original Report Authenticated By: Lacy Duverney, M.D.   US Abdomen Complete  03/15/2013   *RADIOLOGY REPORT*  Clinical Data:  Nausea  ABDOMINAL ULTRASOUND COMPLETE  Comparison:  04/20/2012  Findings:  Gallbladder:  Small incidental gallbladder polyp measures 3 mm. Normal wall thickness measuring 2 mm.  No Murphy's sign.  No gallstones.  Common Bile Duct:  Within normal limits in caliber.  Liver: Lateral left hepatic lobe complex cystic areas noted one measures 1.3 cm in dimension and a second 2.1 cm in diameter. These areas actually appear smaller than the prior exam.  No biliary dilatation.  IVC:  Appears normal.  Pancreas:  No abnormality identified.  Spleen:  Not visualized.  Right kidney:  Normal in size and parenchymal echogenicity.  No evidence of mass or hydronephrosis.  Left kidney:  Normal in size and parenchymal echogenicity.  No evidence of mass or hydronephrosis. Small 12 mm renal cyst noted in the upper  pole.  Abdominal Aorta:  No aneurysm identified.  IMPRESSION: No acute finding by ultrasound.  Negative for gallstones or biliary dilatation.  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 Miles Costain, M.D.    Microbiology: No results found for this or any previous visit (from the past 240 hour(s)).   Labs: Basic Metabolic Panel:  Recent Labs Lab 03/15/13 0825 03/16/13 0440 03/21/13 0858  NA 140 138 136  K 3.8 3.6 4.6  CL 103 104 103  CO2 26 23 23   GLUCOSE 116* 86 86  BUN 13 15 14   CREATININE 0.79 0.91 0.75  CALCIUM 9.7 9.4 9.8   Liver Function Tests:  Recent Labs Lab 03/15/13 1610 03/16/13 0440  AST 57* 47*  ALT 28 24  ALKPHOS 132* 123*  BILITOT 0.6 0.6  PROT 8.5* 7.8  ALBUMIN 4.1 3.5    Recent Labs Lab 03/15/13 0825 03/15/13 1610  LIPASE 51 50   No results found for this basename: AMMONIA,  in the last 168 hours CBC:  Recent Labs Lab 03/15/13 0825  03/17/13 0540 03/18/13 0345 03/19/13 0345 03/20/13 0425 03/21/13 0500  WBC 8.2  < > 6.7 7.4 7.4 8.4 6.9  NEUTROABS 6.4  --   --   --   --   --   --   HGB 13.4  < > 14.1 13.5 13.9 14.0 13.0  HCT 38.8  < > 42.2 39.4 39.5 39.8 37.4  MCV 89.2  < > 90.9 89.1 89.0 89.8 89.5  PLT 189  < > 196 181 208 191 192  < > = values in this interval not displayed. Cardiac Enzymes:  Recent Labs Lab 03/15/13 1610 03/15/13 2152 03/16/13 0440  TROPONINI <0.30 0.48* 0.44*   BNP: BNP (last 3 results) No results found for this basename: PROBNP,  in the last 8760 hours CBG: No results found for this basename: GLUCAP,  in the last 168 hours     Signed:  Rhetta Mura  [facility] 03/21/2013, 6:33 PM

## 2013-03-21 NOTE — Progress Notes (Signed)
ANTICOAGULATION CONSULT NOTE  Pharmacy Consult for Heparin and Coumadin Indication: atrial fibrillation, St. Jude MVR  Allergies  Allergen Reactions  . Codeine Nausea And Vomiting    Patient Measurements: Height: 5\' 1"  (154.9 cm) Weight: 138 lb 11.2 oz (62.914 kg) IBW/kg (Calculated) : 47.8   Vital Signs: Temp: 97.4 F (36.3 C) (07/01 0513) Temp src: Oral (07/01 0513) BP: 137/68 mmHg (07/01 0513) Pulse Rate: 68 (07/01 0513)  Labs:  Recent Labs  03/19/13 0345 03/20/13 0425 03/20/13 1530 03/21/13 0500  HGB 13.9 14.0  --  13.0  HCT 39.5 39.8  --  37.4  PLT 208 191  --  192  LABPROT 17.5* 19.2*  --  20.0*  INR 1.48 1.67*  --  1.76*  HEPARINUNFRC 0.59 1.93* 1.39* 0.99*    Estimated Creatinine Clearance: 48.2 ml/min (by C-G formula based on Cr of 0.91).  Assessment: 72 y.o. female with St. Judes MVR and Afib for anticoagulation.   Goal of Therapy:  Heparin level 0.3-0.7 units/ml INR 2.0-2.5 Monitor platelets by anticoagulation protocol: Yes   Plan:  Hold heparin x 60 minutes, then decrease heparin 650 units/hr Coumadin 7.5 mg today  Geannie Risen, PharmD, BCPS   03/21/2013 6:16 AM

## 2013-03-22 LAB — CBC
MCH: 30.9 pg (ref 26.0–34.0)
MCHC: 34.3 g/dL (ref 30.0–36.0)
MCV: 90.2 fL (ref 78.0–100.0)
Platelets: 177 10*3/uL (ref 150–400)

## 2013-03-22 LAB — PROTIME-INR: Prothrombin Time: 21.6 seconds — ABNORMAL HIGH (ref 11.6–15.2)

## 2013-03-22 MED ORDER — WARFARIN SODIUM 5 MG PO TABS: 5.0000 mg | ORAL_TABLET | Freq: Every day | ORAL | Status: DC

## 2013-03-22 NOTE — Progress Notes (Signed)
Pt seen and examined, stable for Discharge, See DC summary as detailed by Dr.Samtani yesterday INR 1.94 today, stop heparin gtt and continue coumadin at 5mg  daily, next INR check Monday 7/7 at [facility] [phone]

## 2013-03-29 ENCOUNTER — Ambulatory Visit (INDEPENDENT_AMBULATORY_CARE_PROVIDER_SITE_OTHER): Payer: Medicare Other | Admitting: Pharmacist Clinician (PhC)/ Clinical Pharmacy Specialist

## 2013-03-29 VITALS — BP 130/72 | HR 80

## 2013-03-29 DIAGNOSIS — Z952 Presence of prosthetic heart valve: Secondary | ICD-10-CM

## 2013-03-29 DIAGNOSIS — I4891 Unspecified atrial fibrillation: Secondary | ICD-10-CM

## 2013-03-29 DIAGNOSIS — Z7901 Long term (current) use of anticoagulants: Secondary | ICD-10-CM

## 2013-03-29 DIAGNOSIS — Z954 Presence of other heart-valve replacement: Secondary | ICD-10-CM

## 2013-03-29 DIAGNOSIS — I482 Chronic atrial fibrillation, unspecified: Secondary | ICD-10-CM

## 2013-03-29 LAB — POCT INR: INR: 2.5

## 2013-04-03 ENCOUNTER — Encounter: Payer: Self-pay | Admitting: Cardiology

## 2013-04-03 ENCOUNTER — Ambulatory Visit (INDEPENDENT_AMBULATORY_CARE_PROVIDER_SITE_OTHER): Payer: Medicare Other | Admitting: Cardiology

## 2013-04-03 ENCOUNTER — Other Ambulatory Visit: Payer: Self-pay | Admitting: Cardiovascular Disease

## 2013-04-03 VITALS — BP 124/82 | Ht 60.0 in | Wt 145.4 lb

## 2013-04-03 DIAGNOSIS — Z950 Presence of cardiac pacemaker: Secondary | ICD-10-CM

## 2013-04-03 DIAGNOSIS — Z952 Presence of prosthetic heart valve: Secondary | ICD-10-CM

## 2013-04-03 DIAGNOSIS — Z7901 Long term (current) use of anticoagulants: Secondary | ICD-10-CM

## 2013-04-03 DIAGNOSIS — I4891 Unspecified atrial fibrillation: Secondary | ICD-10-CM

## 2013-04-03 DIAGNOSIS — I10 Essential (primary) hypertension: Secondary | ICD-10-CM | POA: Insufficient documentation

## 2013-04-03 DIAGNOSIS — R000 Tachycardia, unspecified: Secondary | ICD-10-CM

## 2013-04-03 DIAGNOSIS — I482 Chronic atrial fibrillation, unspecified: Secondary | ICD-10-CM

## 2013-04-03 DIAGNOSIS — Z954 Presence of other heart-valve replacement: Secondary | ICD-10-CM

## 2013-04-03 LAB — PACEMAKER DEVICE OBSERVATION

## 2013-04-03 NOTE — Assessment & Plan Note (Signed)
INR goal 2-2.5 with mechanical valve and atrial fibrillation secondary to previous subdural hematoma as well as liver hemorrhage.

## 2013-04-03 NOTE — Progress Notes (Signed)
In office pacemaker interrogation. Normal device function. No changes made this session. 

## 2013-04-03 NOTE — Patient Instructions (Addendum)
BP is excellent today  Pacemaker is stable, you were having fast heart rates adding to your dizziness.  Continue current meds.  If you have more of these episodes please call for appt.  See Dr. Royann Shivers back in 2 months

## 2013-04-03 NOTE — Assessment & Plan Note (Signed)
Stable

## 2013-04-03 NOTE — Assessment & Plan Note (Signed)
Found with pacemaker interrogation today. These episodes have improved with the addition of Cardizem. We will leave her on Cardizem 120 mg daily. She'll follow up at) 2 months unless she begins having increasing symptoms of tachycardia and dizziness.

## 2013-04-03 NOTE — Assessment & Plan Note (Signed)
Medtronic Adapta VVI mode for permanent atrial fibrillation bradycardia

## 2013-04-03 NOTE — Assessment & Plan Note (Signed)
Blood pressure well controlled on the Cardizem as well as beta blocker.

## 2013-04-03 NOTE — Progress Notes (Signed)
04/03/2013   PCP: Geraldo Pitter, MD   Chief Complaint  Patient presents with  . post hosp visit    no chest pain no sob, no edema    Primary Cardiologist:  Dr. 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  Patient presents today after hospitalization for getting up the  morning of admission, feeling dizzy, very dizzy with associated nausea, vomiting, diaphoresis. Stated that she's had some more unusual dyspnea on exertion lately.  She also had had several episodes of dizziness. She said that she had an elevated blood pressure as well when seen by her PCP recently. She otherwise denied chest pain, baseline shortness of breath, orthopnea, fever, vision change, cough, congestion, abdominal pain, hematochezia, melena, dysuria, hematuria, lower extremity edema.  Her troponin was elevated at 0.48 at the max. Her blood  pressure was elevated.  Previously her Cardizem has been stopped as well as amiodarone to help prevent pacing to save pacemaker battery life.  She had been doing well until recently.  Dr. Allyson Sabal did not feel this was acute coronary syndrome she was placed on IV heparin as her INR was low.   Her Cardizem was resumed for better blood pressure control and she was discharged home.  There was thought to use amlodipine instead of Cardizem.  Today she presents without complaints she has no chest pain no shortness of breath she did have one episode of dizziness and she arrived at home.  She feels her heart racing when she is dizzy.  Her INR has been therapeutic since discharge last visit here was July 9 INR was 2.5.  I was concerned whether to leave her on  her change her to amlodipine. We interrogated her pacemaker today and she did have numerous episodes of tachycardia. Perhaps the atrial fib occurred she became dizzy she became nauseated.   We will leave her on the Cardizem.    Allergies  Allergen Reactions  . Codeine Nausea And Vomiting    Current Outpatient Prescriptions  Medication Sig Dispense Refill  . diltiazem (CARDIZEM CD) 120 MG 24 hr capsule Take 1 capsule (120 mg total) by mouth daily.  30 capsule  0  . folic acid (FOLVITE) 400 MCG tablet Take 400 mcg by mouth every other day.      . metoprolol (LOPRESSOR) 50 MG tablet Take 1 tablet (50 mg total) by mouth 2 (two) times daily.  60 tablet  6  . Multiple Vitamin (MULTIVITAMINS PO) Take by mouth daily.      Marland Kitchen OVER THE COUNTER MEDICATION Place 1 drop into both eyes 2 (two) times daily. Drops for dry eyes      . polyethylene glycol (MIRALAX / GLYCOLAX) packet Take 17 g by mouth as needed (for constipation).       . warfarin (COUMADIN) 5 MG tablet Take 1 tablet (5 mg total) by mouth daily at 6 PM. Take  5 mg daily till INR check on  7/7: Goal INR 2-2.5       No current facility-administered medications for this visit.    Past Medical History  Diagnosis Date  . Hypertension   . Atrial fibrillation     normal coronaries cath 2004. Dr Clarene Duke  [facility]   . Sick sinus syndrome     Dr Sharrell Ku. EP study negative. Pacemaker 12/15/06 Medtronic  . Hyperlipidemia   . Anxiety   . Diverticula of colon   . Hemorrhage intraabdominal 03/17/2012  . 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 Jude mechanical prosthesis. LA severe dilated  . Subdural hematoma   . Liver hemorrhage   . Atrial flutter   . Pacemaker     medtronic adapta  . Heart murmur   . CHF (congestive heart failure)   . Pneumonia 2009    resolved.? OPD Rx  . Exertional shortness of breath   . History of blood transfusion     "once" (03/15/2013)  . GERD (gastroesophageal reflux disease)   . Migraines     . Stroke     "they say I had a stroke last year" denies residual on 03/15/2013  . Arthritis     "right shoulder" (03/15/2013)  . Anticoagulated on Coumadin     for mech valve and atrial fib  goal 2.0-2.5    Past Surgical History  Procedure Laterality Date  . Mitral valve replacement  1998    St Jude mechanical; Dr. Tyrone Sage  . Appendectomy    . Tubal ligation    . Tee without cardioversion  09/23/2011    Procedure: TRANSESOPHAGEAL ECHOCARDIOGRAM (TEE);  Surgeon: Chrystie Nose;  Location: [facility];  Service: Cardiovascular;  Laterality: N/A;  . Insert / replace / remove pacemaker  12/15/2006    Medtronic  . US echocardiography  11/19/2011    EF 50-55%,RA mod to severely dilated,LA severely dilated,trace MR,small vegetation or mass on the MV,AOV mildly scleroticmild PI, RV pressure 40-16mmHg  . Persantine cardiolite  08/07/03    mild inf. ischemia   . Tonsillectomy    . Exploratory laparotomy      "had a growth on my intestines" (03/15/2013)  . Cataract extraction w/ intraocular lens  implant, bilateral Bilateral 2013  . Dilation and curettage of uterus      "had fibroids" (03/15/2013)  . Cardiac catheterization  04/02/97    R&L:severe MR/pulmonary hypertension  . Hammer toe surgery Right   . Pacemaker placement  12/15/06    medtronic adapta for SSS    PPI:RJJOACZ:YS colds or fevers, no weight changes Skin:no rashes or ulcers HEENT:no blurred vision, no congestion CV:see HPI PUL:see HPI GI:no diarrhea constipation or melena, no indigestion GU:no hematuria, no dysuria MS:no joint pain, no claudication Neuro:no syncope, no lightheadedness. One episode of fast heart rate and dizziness.  She has trouble finding her words at times she tells me this has been going on since her head bleed Endo:no diabetes, no thyroid disease  PHYSICAL EXAM BP 124/82  Ht 5' (1.524 m)  Wt 145 lb 6.4 oz (65.953 kg)  BMI 28.4 kg/m2 General:Pleasant affect, NAD Skin:Warm and dry, brisk capillary  refill HEENT:normocephalic, sclera clear, mucus membranes moist Neck:supple, no JVD, no bruits  Heart:S1S2 RRR without murmur, gallup, rub or click Lungs:clear without rales, rhonchi, or wheezes AYT:KZSW, non tender, + BS, do not palpate liver spleen or masses Ext:no lower ext edema, 2+ pedal pulses, 2+ radial pulses Neuro:alert and oriented, MAE, follows commands, + facial symmetry  EKG: Atrial fibrillation with ventricular pacing she has a VVI pacemaker there are premature ventricular beats  ASSESSMENT AND PLAN Tachycardia, a fib with RVR  Found with pacemaker interrogation today. These episodes have improved with the addition of Cardizem. We will leave her on Cardizem 120 mg daily.  She'll follow up at) 2 months unless she begins having increasing symptoms of tachycardia and dizziness.  HTN (hypertension) Blood pressure well controlled on the Cardizem as well as beta blocker.  Chronic atrial fibrillation Permanent atrial fibrillation with rate control and VVI pacing appropriately.  Chronic anticoagulation, ( INR goal 2.0-2.5 due to history of subdural hematoma and liver hemorrhage) INR goal 2-2.5 with mechanical valve and atrial fibrillation secondary to previous subdural hematoma as well as liver hemorrhage.  S/P mitral valve replacement, St Jude Stable  Presence of permanent cardiac pacemaker Medtronic Adapta VVI mode for permanent atrial fibrillation bradycardia

## 2013-04-03 NOTE — Assessment & Plan Note (Signed)
Permanent atrial fibrillation with rate control and VVI pacing appropriately.

## 2013-04-04 LAB — PACEMAKER DEVICE OBSERVATION
BATTERY VOLTAGE: 2.7 V
BMOD-0003RV: 30
BMOD-0005RV: 95 {beats}/min
BRDY-0002RV: 60 {beats}/min
RV LEAD AMPLITUDE: 11.2 mv
RV LEAD IMPEDENCE PM: 464 Ohm

## 2013-04-20 ENCOUNTER — Ambulatory Visit (INDEPENDENT_AMBULATORY_CARE_PROVIDER_SITE_OTHER): Payer: Medicare Other | Admitting: Pharmacist Clinician (PhC)/ Clinical Pharmacy Specialist

## 2013-04-20 DIAGNOSIS — I482 Chronic atrial fibrillation, unspecified: Secondary | ICD-10-CM

## 2013-04-20 DIAGNOSIS — I4891 Unspecified atrial fibrillation: Secondary | ICD-10-CM

## 2013-04-20 DIAGNOSIS — Z952 Presence of prosthetic heart valve: Secondary | ICD-10-CM

## 2013-04-20 DIAGNOSIS — Z7901 Long term (current) use of anticoagulants: Secondary | ICD-10-CM

## 2013-04-20 DIAGNOSIS — Z954 Presence of other heart-valve replacement: Secondary | ICD-10-CM

## 2013-05-10 ENCOUNTER — Ambulatory Visit (INDEPENDENT_AMBULATORY_CARE_PROVIDER_SITE_OTHER): Payer: Medicare Other | Admitting: Pharmacist Clinician (PhC)/ Clinical Pharmacy Specialist

## 2013-05-10 DIAGNOSIS — Z7901 Long term (current) use of anticoagulants: Secondary | ICD-10-CM

## 2013-05-10 DIAGNOSIS — Z954 Presence of other heart-valve replacement: Secondary | ICD-10-CM

## 2013-05-10 DIAGNOSIS — Z952 Presence of prosthetic heart valve: Secondary | ICD-10-CM

## 2013-05-10 DIAGNOSIS — I482 Chronic atrial fibrillation, unspecified: Secondary | ICD-10-CM

## 2013-05-10 DIAGNOSIS — I4891 Unspecified atrial fibrillation: Secondary | ICD-10-CM

## 2013-05-10 LAB — POCT INR: INR: 2.3

## 2013-05-11 ENCOUNTER — Ambulatory Visit: Payer: Medicare Other | Admitting: Pharmacist Clinician (PhC)/ Clinical Pharmacy Specialist

## 2013-06-05 ENCOUNTER — Encounter: Payer: Medicare Other | Admitting: Cardiovascular Disease

## 2013-06-21 ENCOUNTER — Ambulatory Visit (INDEPENDENT_AMBULATORY_CARE_PROVIDER_SITE_OTHER): Payer: Medicare Other | Admitting: Pharmacist Clinician (PhC)/ Clinical Pharmacy Specialist

## 2013-06-21 VITALS — BP 136/80 | HR 84

## 2013-06-21 DIAGNOSIS — I4891 Unspecified atrial fibrillation: Secondary | ICD-10-CM

## 2013-06-21 DIAGNOSIS — Z954 Presence of other heart-valve replacement: Secondary | ICD-10-CM

## 2013-06-21 DIAGNOSIS — Z7901 Long term (current) use of anticoagulants: Secondary | ICD-10-CM

## 2013-06-21 DIAGNOSIS — Z952 Presence of prosthetic heart valve: Secondary | ICD-10-CM

## 2013-06-21 DIAGNOSIS — I482 Chronic atrial fibrillation, unspecified: Secondary | ICD-10-CM

## 2013-06-21 MED ORDER — DILTIAZEM HCL ER COATED BEADS 120 MG PO CP24: 120.0000 mg | ORAL_CAPSULE | Freq: Every day | ORAL | Status: DC

## 2013-06-21 NOTE — Progress Notes (Signed)
No return date, could not close encounter.

## 2013-07-06 ENCOUNTER — Encounter: Payer: Self-pay | Admitting: Cardiovascular Disease

## 2013-07-06 ENCOUNTER — Ambulatory Visit (INDEPENDENT_AMBULATORY_CARE_PROVIDER_SITE_OTHER): Payer: Medicare Other | Admitting: Cardiovascular Disease

## 2013-07-06 VITALS — BP 152/82 | HR 86 | Ht 61.0 in | Wt 144.7 lb

## 2013-07-06 DIAGNOSIS — Z7901 Long term (current) use of anticoagulants: Secondary | ICD-10-CM

## 2013-07-06 DIAGNOSIS — I4891 Unspecified atrial fibrillation: Secondary | ICD-10-CM

## 2013-07-06 DIAGNOSIS — I482 Chronic atrial fibrillation, unspecified: Secondary | ICD-10-CM

## 2013-07-06 DIAGNOSIS — Z950 Presence of cardiac pacemaker: Secondary | ICD-10-CM

## 2013-07-06 DIAGNOSIS — Z954 Presence of other heart-valve replacement: Secondary | ICD-10-CM

## 2013-07-06 DIAGNOSIS — Z952 Presence of prosthetic heart valve: Secondary | ICD-10-CM

## 2013-07-06 LAB — PACEMAKER DEVICE OBSERVATION
BATTERY VOLTAGE: 2.68 V
BMOD-0003RV: 30
BMOD-0005RV: 95 {beats}/min
RV LEAD IMPEDENCE PM: 452 Ohm
VENTRICULAR PACING PM: 70.5

## 2013-07-06 NOTE — Assessment & Plan Note (Signed)
Prosthetic valve function is normal by echocardiogram earlier this year

## 2013-07-06 NOTE — Progress Notes (Signed)
Patient ID: POLA FURNO, female   DOB: [birth year], 72 y.o.   MRN: [id number]      Reason for office visit Status post mitral valve replacement, atrial fibrillation, pacemaker  Mrs. Weberg has been doing well. She has good functional status. She'll becomes short of breath with greater than usual activity. She denies palpitations or syncope. She does not have any bleeding problems or any focal neurological deficits. No other signs of embolism. She has a mechanical mitral valve prosthesis(St. Jude 29 mm, 1998) but we are keeping her prothrombin times relatively low (INR 2.0-2.5) because of a previous history of subarachnoid hemorrhage (December 2012) and intrahepatic hemorrhage requiring arterial embolization (June of 2013). A single chamber pacemaker was implanted in 2008 for atrial fibrillation with slow ventricular response.   Allergies  Allergen Reactions  . Codeine Nausea And Vomiting    Current Outpatient Prescriptions  Medication Sig Dispense Refill  . diltiazem (CARDIZEM CD) 120 MG 24 hr capsule Take 1 capsule (120 mg total) by mouth daily.  90 capsule  1  . folic acid (FOLVITE) 400 MCG tablet Take 400 mcg by mouth every other day.      . metoprolol (LOPRESSOR) 50 MG tablet Take 1 tablet (50 mg total) by mouth 2 (two) times daily.  60 tablet  6  . Multiple Vitamin (MULTIVITAMINS PO) Take by mouth daily.      Marland Kitchen OVER THE COUNTER MEDICATION Place 1 drop into both eyes 2 (two) times daily. Drops for dry eyes      . polyethylene glycol (MIRALAX / GLYCOLAX) packet Take 17 g by mouth as needed (for constipation).       . warfarin (COUMADIN) 5 MG tablet Take 1 tablet (5 mg total) by mouth daily at 6 PM. Take  5 mg daily till INR check on  7/7: Goal INR 2-2.5       No current facility-administered medications for this visit.    Past Medical History  Diagnosis Date  . Hypertension   . Atrial fibrillation     normal coronaries cath 2004. Dr Clarene Duke [facility]   . Sick sinus syndrome     Dr Sharrell Ku. EP study negative. Pacemaker 12/15/06 Medtronic  . Hyperlipidemia   . Anxiety   . Diverticula of colon   . Hemorrhage intraabdominal 03/17/2012  . 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 Jude mechanical prosthesis. LA severe dilated  . Subdural hematoma   . Liver hemorrhage   . Atrial flutter   . Pacemaker     medtronic adapta  . Heart murmur   . CHF (congestive heart failure)   . Pneumonia 2009    resolved.? OPD Rx  . Exertional shortness of breath   . History of blood transfusion     "once" (03/15/2013)  . GERD (gastroesophageal reflux disease)   . Migraines   . Stroke     "they say I had a stroke last year" denies residual on 03/15/2013  . Arthritis     "right shoulder" (03/15/2013)  . Anticoagulated on Coumadin     for mech valve and atrial fib  goal 2.0-2.5    Past Surgical History  Procedure Laterality Date  . Mitral valve replacement  1998    St Jude mechanical; Dr. Tyrone Sage  . Appendectomy    . Tubal ligation    . Tee without cardioversion  09/23/2011    Procedure: TRANSESOPHAGEAL ECHOCARDIOGRAM (TEE);  Surgeon: Chrystie Nose;  Location: [facility];  Service: Cardiovascular;  Laterality: N/A;  . Insert / replace / remove pacemaker  12/15/2006    Medtronic  . US echocardiography  11/19/2011    EF 50-55%,RA mod to severely dilated,LA severely dilated,trace MR,small vegetation or mass on the MV,AOV mildly scleroticmild PI, RV pressure 40-72mmHg  . Persantine cardiolite  08/07/03    mild inf. ischemia   . Tonsillectomy    . Exploratory laparotomy      "had a growth on my intestines" (03/15/2013)  . Cataract extraction w/ intraocular lens  implant, bilateral Bilateral 2013  . Dilation and curettage of uterus      "had fibroids" (03/15/2013)  . Cardiac catheterization  04/02/97    R&L:severe MR/pulmonary hypertension  . Hammer toe surgery Right   . Pacemaker placement  12/15/06    medtronic adapta for SSS    Family History  Problem  Relation Age of Onset  . Cancer Mother   . Stroke Father   . Cancer Sister   . Leukemia Sister   . Healthy Brother   . Healthy Sister   . Diabetes Sister   . Healthy Brother   . Healthy Brother     History   Social History  . Marital Status: Married    Spouse Name: N/A    Number of Children: N/A  . Years of Education: N/A   Occupational History  . Not on file.   Social History Main Topics  . Smoking status: Never Smoker   . Smokeless tobacco: Never Used  . Alcohol Use: No  . Drug Use: No  . Sexual Activity: No   Other Topics Concern  . Not on file   Social History Narrative  . No narrative on file    Review of systems: The patient specifically denies any chest pain at rest or with exertion, dyspnea at rest or with exertion, orthopnea, paroxysmal nocturnal dyspnea, syncope, palpitations, focal neurological deficits, intermittent claudication, lower extremity edema, unexplained weight gain, cough, hemoptysis or wheezing.  The patient also denies abdominal pain, nausea, vomiting, dysphagia, diarrhea, constipation, polyuria, polydipsia, dysuria, hematuria, frequency, urgency, abnormal bleeding or bruising, fever, chills, unexpected weight changes, mood swings, change in skin or hair texture, change in voice quality, auditory or visual problems, allergic reactions or rashes, new musculoskeletal complaints other than usual "aches and pains".   PHYSICAL EXAM BP 152/82  Pulse 86  Ht 5\' 1"  (1.549 m)  Wt 144 lb 11.2 oz (65.635 kg)  BMI 27.35 kg/m2  General: Alert, oriented x3, no distress Head: no evidence of trauma, PERRL, EOMI, no exophtalmos or lid lag, no myxedema, no xanthelasma; normal ears, nose and oropharynx Neck: normal jugular venous pulsations and no hepatojugular reflux; brisk carotid pulses without delay and no carotid bruits Chest: clear to auscultation, no signs of consolidation by percussion or palpation, normal fremitus, symmetrical and full respiratory  excursions, sternotomy scar, healthy left subclavian pacemaker site Cardiovascular: normal position and quality of the apical impulse, irregular rhythm, normal first and paradoxically split second heart sounds, no rubs or gallops, crisp prosthetic valve sounds, grade 1/6 diastolic rumble at the apex Abdomen: no tenderness or distention, no masses by palpation, no abnormal pulsatility or arterial bruits, normal bowel sounds, no hepatosplenomegaly Extremities: no clubbing, cyanosis or edema; 2+ radial, ulnar and brachial pulses bilaterally; 2+ right femoral, posterior tibial and dorsalis pedis pulses; 2+ left femoral, posterior tibial and dorsalis pedis pulses; no subclavian or femoral bruits Neurological: grossly nonfocal   EKG: Atrial fibrillation, ventricular pacing with occasional native AV conduction  Lipid  Panel  No results found for this basename: chol, trig, hdl, cholhdl, vldl, ldlcalc    BMET    Component Value Date/Time   NA 136 03/21/2013 0858   NA 138 03/14/2010 1318   K 4.6 03/21/2013 0858   K 4.1 03/14/2010 1318   CL 103 03/21/2013 0858   CL 100 03/14/2010 1318   CO2 23 03/21/2013 0858   CO2 27 03/14/2010 1318   GLUCOSE 86 03/21/2013 0858   GLUCOSE 101 03/14/2010 1318   BUN 14 03/21/2013 0858   BUN 14 03/14/2010 1318   CREATININE 0.75 03/21/2013 0858   CREATININE 0.63 10/28/2011 1453   CALCIUM 9.8 03/21/2013 0858   CALCIUM 9.9 03/14/2010 1318   GFRNONAA 83* 03/21/2013 0858   GFRAA >90 03/21/2013 0858     ASSESSMENT AND PLAN Chronic atrial fibrillation Good rate control consistently by pacemaker check. On appropriate anticoagulation therapy  Chronic anticoagulation, ( INR goal 2.0-2.5 due to history of subdural hematoma and liver hemorrhage)    S/P mitral valve replacement, St Jude Prosthetic valve function is normal by echocardiogram earlier this year  Pacemaker - single chamber Medtronic Adapta, 2008 There is roughly 70% ventricular pacing. The old lead and battery parameters appear  to be within normal range. Generator estimated longevity of about 1-1/2 years. Not pacemaker dependent. Rare episodes of rapid ventricular response.  [employer] pacemaker checks every 3 months, followup in the office in a year Orders Placed This Encounter  Procedures  . EKG 12-Lead   No orders of the defined types were placed in this encounter.    Junious Silk, MD, [facility] [phone] office [phone] pager

## 2013-07-06 NOTE — Patient Instructions (Addendum)
Remote monitoring is used to monitor your pacemaker from home. This monitoring reduces the number of office visits required to check your device to one time per year. It allows us to keep an eye on the functioning of your device to ensure it is working properly. You are scheduled for a device check from home on 10-09-2013. You may send your transmission at any time that day. If you have a wireless device, the transmission will be sent automatically. After your physician reviews your transmission, you will receive a postcard with your next transmission date.  Your physician recommends that you schedule a follow-up appointment in: 12 months    

## 2013-07-06 NOTE — Assessment & Plan Note (Signed)
Good rate control consistently by pacemaker check. On appropriate anticoagulation therapy

## 2013-07-06 NOTE — Assessment & Plan Note (Signed)
There is roughly 70% ventricular pacing. The old lead and battery parameters appear to be within normal range. Generator estimated longevity of about 1-1/2 years. Not pacemaker dependent. Rare episodes of rapid ventricular response.

## 2013-07-07 ENCOUNTER — Encounter: Payer: Self-pay | Admitting: Cardiovascular Disease

## 2013-07-17 ENCOUNTER — Ambulatory Visit
Admission: RE | Admit: 2013-07-17 | Discharge: 2013-07-17 | Disposition: A | Discharge status: Home / Self Care | Payer: Medicare Other | Source: Ambulatory Visit | Attending: Family Medicine | Admitting: Family Medicine

## 2013-07-17 ENCOUNTER — Other Ambulatory Visit: Payer: Self-pay | Admitting: Family Medicine

## 2013-07-17 DIAGNOSIS — R0689 Other abnormalities of breathing: Secondary | ICD-10-CM

## 2013-07-17 DIAGNOSIS — J9801 Acute bronchospasm: Secondary | ICD-10-CM

## 2013-08-02 ENCOUNTER — Ambulatory Visit (INDEPENDENT_AMBULATORY_CARE_PROVIDER_SITE_OTHER): Payer: Medicare Other | Admitting: Pharmacist Clinician (PhC)/ Clinical Pharmacy Specialist

## 2013-08-02 VITALS — BP 130/72 | HR 84

## 2013-08-02 DIAGNOSIS — Z954 Presence of other heart-valve replacement: Secondary | ICD-10-CM

## 2013-08-02 DIAGNOSIS — I4891 Unspecified atrial fibrillation: Secondary | ICD-10-CM

## 2013-08-02 DIAGNOSIS — Z7901 Long term (current) use of anticoagulants: Secondary | ICD-10-CM

## 2013-08-02 DIAGNOSIS — Z952 Presence of prosthetic heart valve: Secondary | ICD-10-CM

## 2013-08-02 DIAGNOSIS — I482 Chronic atrial fibrillation, unspecified: Secondary | ICD-10-CM

## 2013-08-30 ENCOUNTER — Ambulatory Visit (INDEPENDENT_AMBULATORY_CARE_PROVIDER_SITE_OTHER): Payer: Medicare Other | Admitting: Pharmacist Clinician (PhC)/ Clinical Pharmacy Specialist

## 2013-08-30 VITALS — BP 138/84 | HR 88

## 2013-08-30 DIAGNOSIS — Z952 Presence of prosthetic heart valve: Secondary | ICD-10-CM

## 2013-08-30 DIAGNOSIS — Z7901 Long term (current) use of anticoagulants: Secondary | ICD-10-CM

## 2013-08-30 DIAGNOSIS — Z954 Presence of other heart-valve replacement: Secondary | ICD-10-CM

## 2013-08-30 DIAGNOSIS — I4891 Unspecified atrial fibrillation: Secondary | ICD-10-CM

## 2013-08-30 DIAGNOSIS — I482 Chronic atrial fibrillation, unspecified: Secondary | ICD-10-CM

## 2013-08-30 LAB — POCT INR: INR: 2.5

## 2013-09-04 ENCOUNTER — Other Ambulatory Visit: Payer: Self-pay | Admitting: Cardiovascular Disease

## 2013-09-04 NOTE — Telephone Encounter (Signed)
Rx was sent to pharmacy electronically. 

## 2013-10-11 ENCOUNTER — Ambulatory Visit (INDEPENDENT_AMBULATORY_CARE_PROVIDER_SITE_OTHER): Payer: Medicare PPO | Admitting: Pharmacist Clinician (PhC)/ Clinical Pharmacy Specialist

## 2013-10-11 VITALS — BP 144/80 | HR 72

## 2013-10-11 DIAGNOSIS — Z952 Presence of prosthetic heart valve: Secondary | ICD-10-CM

## 2013-10-11 DIAGNOSIS — Z7901 Long term (current) use of anticoagulants: Secondary | ICD-10-CM

## 2013-10-11 DIAGNOSIS — Z954 Presence of other heart-valve replacement: Secondary | ICD-10-CM

## 2013-10-11 DIAGNOSIS — I482 Chronic atrial fibrillation, unspecified: Secondary | ICD-10-CM

## 2013-10-11 DIAGNOSIS — I4891 Unspecified atrial fibrillation: Secondary | ICD-10-CM

## 2013-10-11 LAB — POCT INR: INR: 2

## 2013-11-03 ENCOUNTER — Other Ambulatory Visit: Payer: Self-pay | Admitting: Pharmacist Clinician (PhC)/ Clinical Pharmacy Specialist

## 2013-11-03 MED ORDER — WARFARIN SODIUM 5 MG PO TABS: ORAL_TABLET | ORAL | Status: DC

## 2013-11-09 ENCOUNTER — Telehealth: Payer: Self-pay | Admitting: *Deleted

## 2013-11-09 MED ORDER — WARFARIN SODIUM 5 MG PO TABS: ORAL_TABLET | ORAL | Status: DC

## 2013-11-09 NOTE — Telephone Encounter (Signed)
Message forwarded to K. Alvstad, PharmD.  

## 2013-11-09 NOTE — Telephone Encounter (Signed)
Pt's husband called stating that he called [facility] on [address] and they do not have her Warfarin refill and she needs to get that filled.   [place]

## 2013-11-10 ENCOUNTER — Other Ambulatory Visit: Payer: Self-pay | Admitting: Pharmacist Clinician (PhC)/ Clinical Pharmacy Specialist

## 2013-11-10 MED ORDER — WARFARIN SODIUM 5 MG PO TABS: ORAL_TABLET | ORAL | Status: DC

## 2013-11-15 ENCOUNTER — Ambulatory Visit (INDEPENDENT_AMBULATORY_CARE_PROVIDER_SITE_OTHER): Payer: Medicare PPO | Admitting: Pharmacist Clinician (PhC)/ Clinical Pharmacy Specialist

## 2013-11-15 VITALS — BP 142/88 | HR 76

## 2013-11-15 DIAGNOSIS — Z7901 Long term (current) use of anticoagulants: Secondary | ICD-10-CM

## 2013-11-15 DIAGNOSIS — I482 Chronic atrial fibrillation, unspecified: Secondary | ICD-10-CM

## 2013-11-15 DIAGNOSIS — Z954 Presence of other heart-valve replacement: Secondary | ICD-10-CM

## 2013-11-15 DIAGNOSIS — Z952 Presence of prosthetic heart valve: Secondary | ICD-10-CM

## 2013-11-15 DIAGNOSIS — I4891 Unspecified atrial fibrillation: Secondary | ICD-10-CM

## 2013-11-15 LAB — POCT INR: INR: 2.1

## 2013-11-30 ENCOUNTER — Encounter: Payer: Self-pay | Admitting: Oncology

## 2013-11-30 ENCOUNTER — Telehealth: Payer: Self-pay | Admitting: Oncology

## 2013-11-30 ENCOUNTER — Other Ambulatory Visit (HOSPITAL_BASED_OUTPATIENT_CLINIC_OR_DEPARTMENT_OTHER): Payer: Medicare PPO

## 2013-11-30 ENCOUNTER — Ambulatory Visit (HOSPITAL_BASED_OUTPATIENT_CLINIC_OR_DEPARTMENT_OTHER): Payer: Medicare PPO | Admitting: Oncology

## 2013-11-30 VITALS — BP 155/86 | HR 86 | Temp 97.9°F | Resp 18 | Ht 61.0 in | Wt 147.3 lb

## 2013-11-30 DIAGNOSIS — Z7901 Long term (current) use of anticoagulants: Secondary | ICD-10-CM

## 2013-11-30 DIAGNOSIS — D890 Polyclonal hypergammaglobulinemia: Secondary | ICD-10-CM

## 2013-11-30 LAB — CBC WITH DIFFERENTIAL/PLATELET
BASO%: 1.2 % (ref 0.0–2.0)
Basophils Absolute: 0.1 10*3/uL (ref 0.0–0.1)
EOS%: 2.2 % (ref 0.0–7.0)
Eosinophils Absolute: 0.1 10*3/uL (ref 0.0–0.5)
HCT: 41 % (ref 34.8–46.6)
HGB: 13.7 g/dL (ref 11.6–15.9)
LYMPH#: 1.6 10*3/uL (ref 0.9–3.3)
LYMPH%: 24 % (ref 14.0–49.7)
MCH: 30.9 pg (ref 25.1–34.0)
MCHC: 33.5 g/dL (ref 31.5–36.0)
MCV: 92.4 fL (ref 79.5–101.0)
MONO#: 0.6 10*3/uL (ref 0.1–0.9)
MONO%: 9.1 % (ref 0.0–14.0)
NEUT#: 4.4 10*3/uL (ref 1.5–6.5)
NEUT%: 63.5 % (ref 38.4–76.8)
PLATELETS: 190 10*3/uL (ref 145–400)
RBC: 4.44 10*6/uL (ref 3.70–5.45)
RDW: 15.4 % — ABNORMAL HIGH (ref 11.2–14.5)
WBC: 6.9 10*3/uL (ref 3.9–10.3)

## 2013-11-30 NOTE — Telephone Encounter (Signed)
, °

## 2013-11-30 NOTE — Progress Notes (Signed)
OFFICE PROGRESS NOTE  CC  Elyn Peers, MD 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 female returns for followup visit today. She has been seen by me on a yearly basis. No recent hospitalizations. She now is doing well. She otherwise denies any fevers chills night sweats headaches currently she has no shortness of breath no chest pains palpitations no myalgias or arthralgias no bleeding problems. Remainder of the 10 point review of systems is negative.  MEDICAL HISTORY: Past Medical History  Diagnosis Date  . Hypertension   . Atrial fibrillation     normal coronaries cath 2004. Dr Rex Kras [facility]   . Sick sinus syndrome     Dr Crissie Sickles. EP study negative. Pacemaker 12/15/06 Medtronic  . Hyperlipidemia   . Anxiety   . Diverticula of colon   . Hemorrhage intraabdominal 03/17/2012  . 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 Jude mechanical prosthesis. LA severe dilated  . Subdural hematoma   . Liver hemorrhage   . Atrial flutter   . Pacemaker     medtronic adapta  . Heart murmur   . CHF (congestive heart failure)   . Pneumonia 2009    resolved.? OPD Rx  . Exertional shortness of breath   . History of blood transfusion     "once" (03/15/2013)  . GERD (gastroesophageal reflux disease)   . Migraines   . Stroke     "they say I had a stroke last year" denies residual on 03/15/2013  . Arthritis     "right shoulder" (03/15/2013)  . Anticoagulated on Coumadin     for mech valve and atrial fib  goal 2.0-2.5    ALLERGIES:  is allergic to codeine.  MEDICATIONS:  Current Outpatient Prescriptions  Medication Sig Dispense Refill  . calcium carbonate (OS-CAL) 600 MG TABS tablet Take 600 mg by mouth daily.      Marland Kitchen  CARTIA XT 120 MG 24 hr capsule TAKE ONE CAPSULE BY MOUTH DAILY  90 capsule  3  . folic acid (FOLVITE) 174 MCG tablet Take 400 mcg by mouth every other day.      . metoprolol (LOPRESSOR) 50 MG tablet Take 1 tablet (50 mg total) by mouth 2 (two) times daily.  60 tablet  6  . Multiple Vitamin (MULTIVITAMINS PO) Take by mouth daily.      Marland Kitchen OVER THE COUNTER MEDICATION Place 1 drop into both eyes 2 (two) times daily. Drops for dry eyes      . polyethylene glycol (MIRALAX / GLYCOLAX) packet Take 17 g by mouth as needed (for constipation).       . warfarin (COUMADIN) 5 MG tablet Take 1 tablet by mouth daily or as directed  90 tablet  1   No current facility-administered medications for this visit.    SURGICAL HISTORY:  Past Surgical History  Procedure Laterality Date  . Mitral valve replacement  1998    St Jude mechanical; Dr. [name]  . Appendectomy    . Tubal ligation    . Tee without cardioversion  09/23/2011    Procedure: TRANSESOPHAGEAL ECHOCARDIOGRAM (TEE);  Surgeon: [name];  Location: [facility];  Service: Cardiovascular;  Laterality: N/A;  . Insert / replace / remove pacemaker  12/15/2006    Medtronic  . US echocardiography  11/19/2011  EF 50-55%,RA mod to severely dilated,LA severely dilated,trace MR,small vegetation or mass on the MV,AOV mildly scleroticmild PI, RV pressure 40-34mmHg  . Persantine cardiolite  08/07/03    mild inf. ischemia   . Tonsillectomy    . Exploratory laparotomy      "had a growth on my intestines" (03/15/2013)  . Cataract extraction w/ intraocular lens  implant, bilateral Bilateral 2013  . Dilation and curettage of uterus      "had fibroids" (03/15/2013)  . Cardiac catheterization  04/02/97    R&L:severe MR/pulmonary hypertension  . Hammer toe surgery Right   . Pacemaker placement  12/15/06    medtronic adapta for SSS    REVIEW OF SYSTEMS:  Pertinent items are noted in HPI.   PHYSICAL EXAMINATION: Resp: clear to auscultation bilaterally and  normal percussion bilaterally Cardio: irregularly irregular rhythm GI: soft, non-tender; bowel sounds normal; no masses,  no organomegaly Extremities: extremities normal, atraumatic, no cyanosis or edema Neurologic: Alert and oriented X 3, normal strength and tone. Normal symmetric reflexes. Normal coordination and gait  ECOG PERFORMANCE STATUS: 1 - Symptomatic but completely ambulatory  Blood pressure 155/86, pulse 86, temperature 97.9 F (36.6 C), temperature source Oral, resp. rate 18, height 5\' 1"  (1.549 m), weight 147 lb 4.8 oz (66.815 kg).  LABORATORY DATA: Lab Results  Component Value Date   WBC 6.9 11/30/2013   HGB 13.7 11/30/2013   HCT 41.0 11/30/2013   MCV 92.4 11/30/2013   PLT 190 11/30/2013      Chemistry      Component Value Date/Time   NA 136 03/21/2013 0858   NA 138 03/14/2010 1318   K 4.6 03/21/2013 0858   K 4.1 03/14/2010 1318   CL 103 03/21/2013 0858   CL 100 03/14/2010 1318   CO2 23 03/21/2013 0858   CO2 27 03/14/2010 1318   BUN 14 03/21/2013 0858   BUN 14 03/14/2010 1318   CREATININE 0.75 03/21/2013 0858   CREATININE 0.63 10/28/2011 1453      Component Value Date/Time   CALCIUM 9.8 03/21/2013 0858   CALCIUM 9.9 03/14/2010 1318   ALKPHOS 123* 03/16/2013 0440   ALKPHOS 125* 03/14/2010 1318   AST 47* 03/16/2013 0440   AST 46* 03/14/2010 1318   ALT 24 03/16/2013 0440   ALT 22 03/14/2010 1318   BILITOT 0.6 03/16/2013 0440   BILITOT 0.70 03/14/2010 1318       RADIOGRAPHIC STUDIES:  No results found.  ASSESSMENT: 73 year old female with  #1 polyclonal gammopathy.  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     PLAN:   #1 hematological patient seems to be doing well her blood count is reviewed today she looks pretty stable she has no evidence of overt hemolysis going on at this point.  #2 I have recommended that patients continue to follow [place] on a yearly basis. Of course I can also see her on an as needed basis.     All questions were answered. The patient knows  to call the clinic with any problems, questions or concerns. We can certainly see the patient much sooner if necessary.  I spent 15 minutes counseling the patient face to face. The total time spent in the appointment was 30 minutes.    Marcy Panning, MD Medical/Oncology [facility] [phone] (beeper) [phone] (Office)  11/30/2013, 11:41 AM

## 2013-12-13 ENCOUNTER — Other Ambulatory Visit: Payer: Self-pay | Admitting: Pharmacist Clinician (PhC)/ Clinical Pharmacy Specialist

## 2013-12-13 MED ORDER — DILTIAZEM HCL ER COATED BEADS 120 MG PO CP24: 120.0000 mg | ORAL_CAPSULE | Freq: Every day | ORAL | Status: DC

## 2013-12-27 ENCOUNTER — Ambulatory Visit (INDEPENDENT_AMBULATORY_CARE_PROVIDER_SITE_OTHER): Payer: Medicare PPO | Admitting: Pharmacist Clinician (PhC)/ Clinical Pharmacy Specialist

## 2013-12-27 VITALS — BP 140/84 | HR 84

## 2013-12-27 DIAGNOSIS — Z952 Presence of prosthetic heart valve: Secondary | ICD-10-CM

## 2013-12-27 DIAGNOSIS — I482 Chronic atrial fibrillation, unspecified: Secondary | ICD-10-CM

## 2013-12-27 DIAGNOSIS — I4891 Unspecified atrial fibrillation: Secondary | ICD-10-CM

## 2013-12-27 DIAGNOSIS — Z954 Presence of other heart-valve replacement: Secondary | ICD-10-CM

## 2013-12-27 DIAGNOSIS — Z7901 Long term (current) use of anticoagulants: Secondary | ICD-10-CM

## 2013-12-27 LAB — POCT INR: INR: 2.4

## 2014-02-07 ENCOUNTER — Ambulatory Visit (INDEPENDENT_AMBULATORY_CARE_PROVIDER_SITE_OTHER): Payer: Medicare PPO | Admitting: Pharmacist Clinician (PhC)/ Clinical Pharmacy Specialist

## 2014-02-07 DIAGNOSIS — Z954 Presence of other heart-valve replacement: Secondary | ICD-10-CM

## 2014-02-07 DIAGNOSIS — Z952 Presence of prosthetic heart valve: Secondary | ICD-10-CM

## 2014-02-07 DIAGNOSIS — Z7901 Long term (current) use of anticoagulants: Secondary | ICD-10-CM

## 2014-02-07 DIAGNOSIS — I4891 Unspecified atrial fibrillation: Secondary | ICD-10-CM

## 2014-02-07 DIAGNOSIS — I482 Chronic atrial fibrillation, unspecified: Secondary | ICD-10-CM

## 2014-02-07 LAB — POCT INR: INR: 2.5

## 2014-03-05 ENCOUNTER — Other Ambulatory Visit: Payer: Self-pay | Admitting: Cardiovascular Disease

## 2014-03-07 ENCOUNTER — Other Ambulatory Visit: Payer: Self-pay | Admitting: Cardiovascular Disease

## 2014-03-07 NOTE — Telephone Encounter (Signed)
Rx was sent to pharmacy electronically. 

## 2014-03-21 ENCOUNTER — Ambulatory Visit (INDEPENDENT_AMBULATORY_CARE_PROVIDER_SITE_OTHER): Payer: Commercial Managed Care - HMO | Admitting: Pharmacist Clinician (PhC)/ Clinical Pharmacy Specialist

## 2014-03-21 DIAGNOSIS — Z954 Presence of other heart-valve replacement: Secondary | ICD-10-CM

## 2014-03-21 DIAGNOSIS — I482 Chronic atrial fibrillation, unspecified: Secondary | ICD-10-CM

## 2014-03-21 DIAGNOSIS — Z7901 Long term (current) use of anticoagulants: Secondary | ICD-10-CM

## 2014-03-21 DIAGNOSIS — I4891 Unspecified atrial fibrillation: Secondary | ICD-10-CM

## 2014-03-21 DIAGNOSIS — Z952 Presence of prosthetic heart valve: Secondary | ICD-10-CM

## 2014-03-21 LAB — POCT INR: INR: 2.6

## 2014-05-02 ENCOUNTER — Ambulatory Visit (INDEPENDENT_AMBULATORY_CARE_PROVIDER_SITE_OTHER): Payer: Commercial Managed Care - HMO | Admitting: Pharmacist Clinician (PhC)/ Clinical Pharmacy Specialist

## 2014-05-02 DIAGNOSIS — Z954 Presence of other heart-valve replacement: Secondary | ICD-10-CM

## 2014-05-02 DIAGNOSIS — Z7901 Long term (current) use of anticoagulants: Secondary | ICD-10-CM

## 2014-05-02 DIAGNOSIS — I4891 Unspecified atrial fibrillation: Secondary | ICD-10-CM

## 2014-05-02 DIAGNOSIS — I482 Chronic atrial fibrillation, unspecified: Secondary | ICD-10-CM

## 2014-05-02 DIAGNOSIS — Z952 Presence of prosthetic heart valve: Secondary | ICD-10-CM

## 2014-05-02 LAB — POCT INR: INR: 2.5

## 2014-05-29 ENCOUNTER — Other Ambulatory Visit: Payer: Self-pay | Admitting: Pharmacist Clinician (PhC)/ Clinical Pharmacy Specialist

## 2014-06-13 ENCOUNTER — Ambulatory Visit (INDEPENDENT_AMBULATORY_CARE_PROVIDER_SITE_OTHER): Payer: Commercial Managed Care - HMO | Admitting: Pharmacist Clinician (PhC)/ Clinical Pharmacy Specialist

## 2014-06-13 DIAGNOSIS — Z954 Presence of other heart-valve replacement: Secondary | ICD-10-CM

## 2014-06-13 DIAGNOSIS — Z7901 Long term (current) use of anticoagulants: Secondary | ICD-10-CM | POA: Diagnosis not present

## 2014-06-13 DIAGNOSIS — I482 Chronic atrial fibrillation, unspecified: Secondary | ICD-10-CM

## 2014-06-13 DIAGNOSIS — I4891 Unspecified atrial fibrillation: Secondary | ICD-10-CM | POA: Diagnosis not present

## 2014-06-13 DIAGNOSIS — Z952 Presence of prosthetic heart valve: Secondary | ICD-10-CM

## 2014-06-13 LAB — POCT INR
INR: 2.2
INR: 3.4

## 2014-07-02 ENCOUNTER — Other Ambulatory Visit: Payer: Self-pay | Admitting: Cardiovascular Disease

## 2014-07-02 NOTE — Telephone Encounter (Signed)
Rx was sent to pharmacy electronically. OV 10/19

## 2014-07-09 ENCOUNTER — Ambulatory Visit (INDEPENDENT_AMBULATORY_CARE_PROVIDER_SITE_OTHER): Payer: Commercial Managed Care - HMO | Admitting: Cardiovascular Disease

## 2014-07-09 ENCOUNTER — Encounter: Payer: Self-pay | Admitting: Cardiovascular Disease

## 2014-07-09 VITALS — BP 158/88 | HR 75 | Ht 61.0 in | Wt 152.7 lb

## 2014-07-09 DIAGNOSIS — R000 Tachycardia, unspecified: Secondary | ICD-10-CM

## 2014-07-09 DIAGNOSIS — I4891 Unspecified atrial fibrillation: Secondary | ICD-10-CM

## 2014-07-09 DIAGNOSIS — I38 Endocarditis, valve unspecified: Secondary | ICD-10-CM

## 2014-07-09 DIAGNOSIS — I482 Chronic atrial fibrillation, unspecified: Secondary | ICD-10-CM

## 2014-07-09 DIAGNOSIS — I5033 Acute on chronic diastolic (congestive) heart failure: Secondary | ICD-10-CM

## 2014-07-09 DIAGNOSIS — Z954 Presence of other heart-valve replacement: Secondary | ICD-10-CM

## 2014-07-09 DIAGNOSIS — I5042 Chronic combined systolic (congestive) and diastolic (congestive) heart failure: Secondary | ICD-10-CM | POA: Insufficient documentation

## 2014-07-09 DIAGNOSIS — D689 Coagulation defect, unspecified: Secondary | ICD-10-CM

## 2014-07-09 DIAGNOSIS — Z950 Presence of cardiac pacemaker: Secondary | ICD-10-CM

## 2014-07-09 DIAGNOSIS — R5381 Other malaise: Secondary | ICD-10-CM

## 2014-07-09 DIAGNOSIS — Z45018 Encounter for adjustment and management of other part of cardiac pacemaker: Secondary | ICD-10-CM | POA: Insufficient documentation

## 2014-07-09 DIAGNOSIS — T826XXS Infection and inflammatory reaction due to cardiac valve prosthesis, sequela: Secondary | ICD-10-CM

## 2014-07-09 DIAGNOSIS — Z79899 Other long term (current) drug therapy: Secondary | ICD-10-CM

## 2014-07-09 DIAGNOSIS — Z45010 Encounter for checking and testing of cardiac pacemaker pulse generator [battery]: Secondary | ICD-10-CM

## 2014-07-09 DIAGNOSIS — R0602 Shortness of breath: Secondary | ICD-10-CM

## 2014-07-09 DIAGNOSIS — Z952 Presence of prosthetic heart valve: Secondary | ICD-10-CM

## 2014-07-09 MED ORDER — FUROSEMIDE 20 MG PO TABS: 20.0000 mg | ORAL_TABLET | Freq: Every day | ORAL | Status: DC

## 2014-07-09 NOTE — Patient Instructions (Signed)
START Furosemide 20mg  daily.  Your pacemaker battery needs to be changed out MEDTRONIC Single chamber.  This is done at [facility] as an out-patient.     Your physician recommends that you return for lab work in: 5-7 days prior to the pacemaker procedure.   Your physician has requested that you have an echocardiogram. Echocardiography is a painless test that uses sound waves to create images of your heart. It provides your doctor with information about the size and shape of your heart and how well your heart's chambers and valves are working. This procedure takes approximately one hour. There are no restrictions for this procedure.

## 2014-07-09 NOTE — Progress Notes (Signed)
Patient ID: Carrie [name], female   DOB: 1941-03-08, 73 y.o.   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 She has gained 8 pounds since last year, 5-1/2 pounds since March and this may merely be fluid retention. She has NYHA functional class II exertional dyspnea. Edema is persistent even after overnight recumbent position.  She has normal coronary arteries by previous angiography (2004) normal left ventricular systolic function by echocardiography.  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 Jude in 1998). She has a history of serious bleeding complications on warfarin. In December 2012 she had subarachnoid hemorrhage and in June 2013 she had intrahepatic hemorrhage requiring arterial embolization and we are keeping her INR in the lower range (2.0-2.5). She also has permanent atrial fibrillation with satisfactory rate control and a single-chamber permanent pacemaker (Medtronic Adapta 2008) for episodes of bradycardia/slow ventricular response. Interrogation of the device today shows that it reached elective replacement indicator last month. The prevalence of ventricular pacing is around 75%, similar to her historical trend. Despite the high process of ventricular pacing she has still had 100 episodes of ventricular high rate recorded all of which probably represent atrial fibrillation rapid ventricular response. The longest episode was just over 2 minutes in duration.   Allergies  Allergen Reactions  . Codeine Nausea And Vomiting    Current Outpatient Prescriptions  Medication Sig Dispense Refill  . Biotin 1000 MCG tablet Take 1,000 mcg by mouth daily.      . calcium  carbonate (OS-CAL) 600 MG TABS tablet Take 600 mg by mouth daily.      Marland Kitchen diltiazem (CARTIA XT) 120 MG 24 hr capsule Take 1 capsule (120 mg total) by mouth daily.  90 capsule  3  . folic acid (FOLVITE) 631 MCG tablet Take 400 mcg by mouth every other day.      . metoprolol (LOPRESSOR) 50 MG tablet TAKE 1 TABLET BY MOUTH TWICE DAILY  60 tablet  0  . Multiple Vitamin (MULTIVITAMINS PO) Take by mouth daily.      . Omega-3 Fatty Acids (FISH OIL) 1000 MG CAPS Take 1,000 mg by mouth daily.      Marland Kitchen OVER THE COUNTER MEDICATION Place 1 drop into both eyes 2 (two) times daily. Drops for dry eyes      . polyethylene glycol (MIRALAX / GLYCOLAX) packet Take 17 g by mouth as needed (for constipation).       . warfarin (COUMADIN) 5 MG tablet TAKE 1 TABLET BY MOUTH DAILY AS DIRECTED  90 tablet  0  . furosemide (LASIX) 20 MG tablet Take 1 tablet (20 mg total) by mouth daily.  30 tablet  6   No current facility-administered medications for this visit.    Past Medical History  Diagnosis Date  . Hypertension   . Atrial fibrillation     normal coronaries cath 2004. Dr Rex Kras [facility]   . Sick sinus syndrome     Dr Crissie Sickles. EP study negative. Pacemaker 12/15/06 Medtronic  . Hyperlipidemia   . Anxiety   . Diverticula of colon   . Hemorrhage intraabdominal 03/17/2012  . 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 Jude mechanical prosthesis. LA severe dilated  .  Subdural hematoma   . Liver hemorrhage   . Atrial flutter   . Pacemaker     medtronic adapta  . Heart murmur   . CHF (congestive heart failure)   . Pneumonia 2009    resolved.? OPD Rx  . Exertional shortness of breath   . History of blood transfusion     "once" (03/15/2013)  . GERD (gastroesophageal reflux disease)   . Migraines   . Stroke     "they say I had a stroke last year" denies residual on 03/15/2013  . Arthritis     "right shoulder" (03/15/2013)  . Anticoagulated on Coumadin     for mech valve and atrial fib  goal  2.0-2.5    Past Surgical History  Procedure Laterality Date  . Mitral valve replacement  1998    St Jude mechanical; Dr. [name]  . Appendectomy    . Tubal ligation    . Tee without cardioversion  09/23/2011    Procedure: TRANSESOPHAGEAL ECHOCARDIOGRAM (TEE);  Surgeon: [name];  Location: [facility];  Service: Cardiovascular;  Laterality: N/A;  . Insert / replace / remove pacemaker  12/15/2006    Medtronic  . US echocardiography  11/19/2011    EF 50-55%,RA mod to severely dilated,LA severely dilated,trace MR,small vegetation or mass on the MV,AOV mildly scleroticmild PI, RV pressure 40-85mmHg  . Persantine cardiolite  08/07/03    mild inf. ischemia   . Tonsillectomy    . Exploratory laparotomy      "had a growth on my intestines" (03/15/2013)  . Cataract extraction w/ intraocular lens  implant, bilateral Bilateral 2013  . Dilation and curettage of uterus      "had fibroids" (03/15/2013)  . Cardiac catheterization  04/02/97    R&L:severe MR/pulmonary hypertension  . Hammer toe surgery Right   . Pacemaker placement  12/15/06    medtronic adapta for SSS    Family History  Problem Relation Age of Onset  . Cancer Mother   . Stroke Father   . Cancer Sister   . Leukemia Sister   . Healthy Brother   . Healthy Sister   . Diabetes Sister   . Healthy Brother   . Healthy Brother     History   Social History  . Marital Status: Married    Spouse Name: N/A    Number of Children: N/A  . Years of Education: N/A   Occupational History  . Not on file.   Social History Main Topics  . Smoking status: Never Smoker   . Smokeless tobacco: Never Used  . Alcohol Use: No  . Drug Use: No  . Sexual Activity: No   Other Topics Concern  . Not on file   Social History Narrative  . No narrative on file    Review of systems: The patient specifically denies any chest pain at rest or with exertion, dyspnea at rest, orthopnea, paroxysmal nocturnal dyspnea, syncope, palpitations,  focal neurological deficits, intermittent claudication, lower extremity edema, unexplained weight gain, cough, hemoptysis or wheezing.  The patient also denies abdominal pain, nausea, vomiting, dysphagia, diarrhea, constipation, polyuria, polydipsia, dysuria, hematuria, frequency, urgency, abnormal bleeding or bruising, fever, chills, unexpected weight changes, mood swings, change in skin or hair texture, change in voice quality, auditory or visual problems, allergic reactions or rashes, new musculoskeletal complaints other than usual "aches and pains".   PHYSICAL EXAM BP 158/88  Pulse 75  Ht 5\' 1"  (1.549 m)  Wt 69.264 kg (152 lb 11.2 oz)  BMI  28.87 kg/m2 General: Alert, oriented x3, no distress  Head: no evidence of trauma, PERRL, EOMI, no exophtalmos or lid lag, no myxedema, no xanthelasma; normal ears, nose and oropharynx  Neck: normal jugular venous pulsations and no hepatojugular reflux; brisk carotid pulses without delay and no carotid bruits  Chest: clear to auscultation, no signs of consolidation by percussion or palpation, normal fremitus, symmetrical and full respiratory excursions, sternotomy scar, healthy left subclavian pacemaker site  Cardiovascular: normal position and quality of the apical impulse, irregular rhythm, normal first and paradoxically split second heart sounds, no rubs or gallops, crisp prosthetic valve sounds, grade 1/6 diastolic rumble at the apex  Abdomen: no tenderness or distention, no masses by palpation, no abnormal pulsatility or arterial bruits, normal bowel sounds, no hepatosplenomegaly  Extremities: no clubbing, cyanosis or edema; 2+ radial, ulnar and brachial pulses bilaterally; 2+ right femoral, posterior tibial and dorsalis pedis pulses; 2+ left femoral, posterior tibial and dorsalis pedis pulses; no subclavian or femoral bruits  Neurological: grossly nonfocal   EKG: atrial fibrillation, ventricular pacing  Lipid Panel  No results found for this  basename: chol, trig, hdl, cholhdl, vldl, ldlcalc, ldldirect    BMET    Component Value Date/Time   NA 136 03/21/2013 0858   NA 138 03/14/2010 1318   K 4.6 03/21/2013 0858   K 4.1 03/14/2010 1318   CL 103 03/21/2013 0858   CL 100 03/14/2010 1318   CO2 23 03/21/2013 0858   CO2 27 03/14/2010 1318   GLUCOSE 86 03/21/2013 0858   GLUCOSE 101 03/14/2010 1318   BUN 14 03/21/2013 0858   BUN 14 03/14/2010 1318   CREATININE 0.75 03/21/2013 0858   CREATININE 0.63 10/28/2011 1453   CALCIUM 9.8 03/21/2013 0858   CALCIUM 9.9 03/14/2010 1318   GFRNONAA 83* 03/21/2013 0858   GFRAA >90 03/21/2013 0858     ASSESSMENT AND PLAN  Mrs. Pates has evidence of acute on chronic diastolic heart failure. I recommended that she start treatment with a diuretic and she should undergo an echocardiogram to see if there is a change left ventricular function or evidence of increased gradients across her mitral valve prosthesis. Ventricular rate control is adequate and her chronic arrhythmia is unlikely to the the culprit. She has always had a high prevalence of ventricular pacing, but the possible need for future upgrade to resynchronization pacemaker should be good. She simultaneously needs to have a relatively slow heart rate secondary to the intrinsically stenotic mitral valve prosthesis, while also avoiding excessive ventricular pacing. This may not be possible with the erratic nature of atrial fibrillation.  We'll also need to schedule the change of her generator. Ideally will have reevaluation of left ventricular ejection fraction before that. Generator change will be preferably done while fully anticoagulated with warfarin, due to the increased risk of thrombotic/embolic complications with her mechanical mitral valve.  She is at substantially high risk of bleeding complications with the surgery. While she is not necessary at high risk of infection, this could have devastating consequences in this patient with a mechanical mitral  valve.  Patient Instructions  START Furosemide 20mg  daily.  Your pacemaker battery needs to be changed out MEDTRONIC Single chamber.  This is done at [facility] as an out-patient.     Your physician recommends that you return for lab work in: 5-7 days prior to the pacemaker procedure.   Your physician has requested that you have an echocardiogram. Echocardiography is a painless test that uses sound waves to create  images of your heart. It provides your doctor with information about the size and shape of your heart and how well your heart's chambers and valves are working. This procedure takes approximately one hour. There are no restrictions for this procedure.      Orders Placed This Encounter  Procedures  . APTT  . Protime-INR  . CBC  . Comprehensive metabolic panel  . EKG 12-Lead  . 2D Echocardiogram without contrast  . PACEMAKER GENERATOR CHANGE   Meds ordered this encounter  Medications  . Omega-3 Fatty Acids (FISH OIL) 1000 MG CAPS    Sig: Take 1,000 mg by mouth daily.  . Biotin 1000 MCG tablet    Sig: Take 1,000 mcg by mouth daily.  . furosemide (LASIX) 20 MG tablet    Sig: Take 1 tablet (20 mg total) by mouth daily.    Dispense:  30 tablet    Refill:  Elizabeth Zylee Marchiano, MD, [facility] [phone] office [phone] pager

## 2014-07-11 ENCOUNTER — Ambulatory Visit (HOSPITAL_COMMUNITY)
Admission: RE | Admit: 2014-07-11 | Discharge: 2014-07-11 | Disposition: A | Discharge status: Home / Self Care | Payer: Medicare HMO | Source: Ambulatory Visit | Attending: Cardiovascular Disease | Admitting: Cardiovascular Disease

## 2014-07-11 DIAGNOSIS — I4891 Unspecified atrial fibrillation: Secondary | ICD-10-CM | POA: Insufficient documentation

## 2014-07-11 DIAGNOSIS — E785 Hyperlipidemia, unspecified: Secondary | ICD-10-CM | POA: Insufficient documentation

## 2014-07-11 DIAGNOSIS — I369 Nonrheumatic tricuspid valve disorder, unspecified: Secondary | ICD-10-CM

## 2014-07-11 DIAGNOSIS — R0600 Dyspnea, unspecified: Secondary | ICD-10-CM | POA: Diagnosis not present

## 2014-07-11 DIAGNOSIS — I350 Nonrheumatic aortic (valve) stenosis: Secondary | ICD-10-CM | POA: Diagnosis present

## 2014-07-11 DIAGNOSIS — R0602 Shortness of breath: Secondary | ICD-10-CM

## 2014-07-11 NOTE — Progress Notes (Signed)
2D Echo Performed 07/11/2014    Marygrace Drought, RCS

## 2014-07-12 ENCOUNTER — Encounter (HOSPITAL_COMMUNITY): Payer: Self-pay | Admitting: Pharmacy Technician

## 2014-07-13 ENCOUNTER — Other Ambulatory Visit: Payer: Self-pay | Admitting: *Deleted

## 2014-07-13 DIAGNOSIS — Z45010 Encounter for checking and testing of cardiac pacemaker pulse generator [battery]: Secondary | ICD-10-CM

## 2014-07-16 LAB — MDC_IDC_ENUM_SESS_TYPE_INCLINIC
Brady Statistic RV Percent Paced: 74.9 %
Lead Channel Impedance Value: 442 Ohm
Lead Channel Pacing Threshold Amplitude: 0.5 V
Lead Channel Pacing Threshold Pulse Width: 0.4 ms
Lead Channel Sensing Intrinsic Amplitude: 11.2 mV
Lead Channel Setting Pacing Amplitude: 2 V
Lead Channel Setting Pacing Pulse Width: 0.4 ms
Lead Channel Setting Sensing Sensitivity: 4 mV

## 2014-07-18 LAB — CBC
HCT: 41.6 % (ref 36.0–46.0)
Hemoglobin: 14.1 g/dL (ref 12.0–15.0)
MCH: 30.2 pg (ref 26.0–34.0)
MCHC: 33.9 g/dL (ref 30.0–36.0)
MCV: 89.1 fL (ref 78.0–100.0)
PLATELETS: 201 10*3/uL (ref 150–400)
RBC: 4.67 MIL/uL (ref 3.87–5.11)
RDW: 15.6 % — AB (ref 11.5–15.5)
WBC: 7.5 10*3/uL (ref 4.0–10.5)

## 2014-07-18 LAB — COMPREHENSIVE METABOLIC PANEL
ALK PHOS: 115 U/L (ref 39–117)
ALT: 23 U/L (ref 0–35)
AST: 51 U/L — AB (ref 0–37)
Albumin: 4.2 g/dL (ref 3.5–5.2)
BUN: 14 mg/dL (ref 6–23)
CO2: 25 mEq/L (ref 19–32)
CREATININE: 0.94 mg/dL (ref 0.50–1.10)
Calcium: 9.9 mg/dL (ref 8.4–10.5)
Chloride: 102 mEq/L (ref 96–112)
Glucose, Bld: 86 mg/dL (ref 70–99)
Potassium: 4.1 mEq/L (ref 3.5–5.3)
Sodium: 136 mEq/L (ref 135–145)
Total Bilirubin: 0.5 mg/dL (ref 0.2–1.2)
Total Protein: 7.9 g/dL (ref 6.0–8.3)

## 2014-07-18 LAB — APTT: APTT: 41 s — AB (ref 24–37)

## 2014-07-18 LAB — PROTIME-INR
INR: 2.18 — AB (ref <1.50)
Prothrombin Time: 24.3 seconds — ABNORMAL HIGH (ref 11.6–15.2)

## 2014-07-20 ENCOUNTER — Encounter: Payer: Self-pay | Admitting: Cardiovascular Disease

## 2014-07-20 ENCOUNTER — Telehealth: Payer: Self-pay | Admitting: Cardiovascular Disease

## 2014-07-20 NOTE — Telephone Encounter (Signed)
Having a generator change out 11/3.  She does not need to keep appt for an INR 11/4.  Labs done 10/28 pre procedure and they will check an INR in the hospital.  Info sent to [facility], pharmD to reschedule. Patient voiced understanding.

## 2014-07-20 NOTE — Telephone Encounter (Signed)
Pt called in wanting to know if she should keep her coumadin appt since she will be having a catheterazation done on 11/3. Please call  Thanks

## 2014-07-23 DIAGNOSIS — I509 Heart failure, unspecified: Secondary | ICD-10-CM | POA: Diagnosis not present

## 2014-07-23 DIAGNOSIS — I482 Chronic atrial fibrillation: Secondary | ICD-10-CM | POA: Diagnosis not present

## 2014-07-23 DIAGNOSIS — Z952 Presence of prosthetic heart valve: Secondary | ICD-10-CM | POA: Diagnosis not present

## 2014-07-23 DIAGNOSIS — Z79899 Other long term (current) drug therapy: Secondary | ICD-10-CM | POA: Diagnosis not present

## 2014-07-23 DIAGNOSIS — E785 Hyperlipidemia, unspecified: Secondary | ICD-10-CM | POA: Diagnosis not present

## 2014-07-23 DIAGNOSIS — I10 Essential (primary) hypertension: Secondary | ICD-10-CM | POA: Diagnosis not present

## 2014-07-23 DIAGNOSIS — I495 Sick sinus syndrome: Secondary | ICD-10-CM | POA: Diagnosis not present

## 2014-07-23 DIAGNOSIS — Z7901 Long term (current) use of anticoagulants: Secondary | ICD-10-CM | POA: Diagnosis not present

## 2014-07-23 DIAGNOSIS — K219 Gastro-esophageal reflux disease without esophagitis: Secondary | ICD-10-CM | POA: Diagnosis not present

## 2014-07-23 DIAGNOSIS — Z45010 Encounter for checking and testing of cardiac pacemaker pulse generator [battery]: Secondary | ICD-10-CM | POA: Diagnosis present

## 2014-07-23 MED ORDER — GENTAMICIN SULFATE 40 MG/ML IJ SOLN
80.0000 mg | INTRAMUSCULAR | Status: AC
  Filled 2014-07-23: qty 2

## 2014-07-23 MED ORDER — CEFAZOLIN SODIUM-DEXTROSE 2-3 GM-% IV SOLR: 2.0000 g | INTRAVENOUS | Status: AC

## 2014-07-23 MED ORDER — SODIUM CHLORIDE 0.9 % IV SOLN
INTRAVENOUS | Status: DC
  Administered 2014-07-24: 12:00:00 via INTRAVENOUS

## 2014-07-23 MED ORDER — SODIUM CHLORIDE 0.9 % IJ SOLN: 3.0000 mL | INTRAMUSCULAR | Status: DC | PRN

## 2014-07-24 ENCOUNTER — Ambulatory Visit (HOSPITAL_COMMUNITY)
Admission: RE | Admit: 2014-07-24 | Discharge: 2014-07-24 | Disposition: A | Discharge status: Home / Self Care | Payer: Commercial Managed Care - HMO | Source: Ambulatory Visit | Attending: Cardiovascular Disease | Admitting: Cardiovascular Disease

## 2014-07-24 ENCOUNTER — Encounter (HOSPITAL_COMMUNITY)
Admission: RE | Disposition: A | Discharge status: Home / Self Care | Payer: Self-pay | Source: Ambulatory Visit | Attending: Cardiovascular Disease

## 2014-07-24 DIAGNOSIS — Z45010 Encounter for checking and testing of cardiac pacemaker pulse generator [battery]: Secondary | ICD-10-CM | POA: Diagnosis not present

## 2014-07-24 DIAGNOSIS — Z79899 Other long term (current) drug therapy: Secondary | ICD-10-CM | POA: Insufficient documentation

## 2014-07-24 DIAGNOSIS — I509 Heart failure, unspecified: Secondary | ICD-10-CM | POA: Insufficient documentation

## 2014-07-24 DIAGNOSIS — I482 Chronic atrial fibrillation: Secondary | ICD-10-CM | POA: Insufficient documentation

## 2014-07-24 DIAGNOSIS — I4811 Longstanding persistent atrial fibrillation: Secondary | ICD-10-CM

## 2014-07-24 DIAGNOSIS — Z950 Presence of cardiac pacemaker: Secondary | ICD-10-CM

## 2014-07-24 DIAGNOSIS — Z952 Presence of prosthetic heart valve: Secondary | ICD-10-CM | POA: Insufficient documentation

## 2014-07-24 DIAGNOSIS — I4891 Unspecified atrial fibrillation: Secondary | ICD-10-CM

## 2014-07-24 DIAGNOSIS — K219 Gastro-esophageal reflux disease without esophagitis: Secondary | ICD-10-CM | POA: Insufficient documentation

## 2014-07-24 DIAGNOSIS — I495 Sick sinus syndrome: Secondary | ICD-10-CM

## 2014-07-24 DIAGNOSIS — Z7901 Long term (current) use of anticoagulants: Secondary | ICD-10-CM | POA: Insufficient documentation

## 2014-07-24 DIAGNOSIS — I10 Essential (primary) hypertension: Secondary | ICD-10-CM | POA: Insufficient documentation

## 2014-07-24 DIAGNOSIS — E785 Hyperlipidemia, unspecified: Secondary | ICD-10-CM | POA: Insufficient documentation

## 2014-07-24 HISTORY — PX: PERMANENT PACEMAKER GENERATOR CHANGE: SHX6022

## 2014-07-24 LAB — PROTIME-INR
INR: 2.16 — ABNORMAL HIGH (ref 0.00–1.49)
PROTHROMBIN TIME: 24.2 s — AB (ref 11.6–15.2)

## 2014-07-24 LAB — SURGICAL PCR SCREEN
MRSA, PCR: NEGATIVE
Staphylococcus aureus: NEGATIVE

## 2014-07-24 SURGERY — PERMANENT PACEMAKER GENERATOR CHANGE
Anesthesia: LOCAL

## 2014-07-24 MED ORDER — METOPROLOL TARTRATE 50 MG PO TABS
50.0000 mg | ORAL_TABLET | Freq: Two times a day (BID) | ORAL | Status: DC
  Administered 2014-07-24: 50 mg via ORAL
  Filled 2014-07-24 (×2): qty 1

## 2014-07-24 MED ORDER — FENTANYL CITRATE 0.05 MG/ML IJ SOLN
INTRAMUSCULAR | Status: AC
  Filled 2014-07-24: qty 2

## 2014-07-24 MED ORDER — LIDOCAINE HCL (PF) 1 % IJ SOLN
INTRAMUSCULAR | Status: AC
  Filled 2014-07-24: qty 60

## 2014-07-24 MED ORDER — HEPARIN (PORCINE) IN NACL 2-0.9 UNIT/ML-% IJ SOLN
INTRAMUSCULAR | Status: AC
Start: 2014-07-24 — End: 2014-07-24
  Filled 2014-07-24: qty 1000

## 2014-07-24 MED ORDER — CEFAZOLIN SODIUM-DEXTROSE 2-3 GM-% IV SOLR
INTRAVENOUS | Status: AC
  Filled 2014-07-24: qty 50

## 2014-07-24 MED ORDER — ACETAMINOPHEN 325 MG PO TABS
325.0000 mg | ORAL_TABLET | ORAL | Status: DC | PRN
  Administered 2014-07-24: 650 mg via ORAL
  Filled 2014-07-24: qty 2

## 2014-07-24 MED ORDER — SODIUM CHLORIDE 0.9 % IV SOLN: INTRAVENOUS | Status: AC

## 2014-07-24 MED ORDER — MIDAZOLAM HCL 5 MG/5ML IJ SOLN
INTRAMUSCULAR | Status: AC
  Filled 2014-07-24: qty 5

## 2014-07-24 MED ORDER — LIDOCAINE HCL (PF) 1 % IJ SOLN
INTRAMUSCULAR | Status: AC
  Filled 2014-07-24: qty 30

## 2014-07-24 MED ORDER — MUPIROCIN 2 % EX OINT
TOPICAL_OINTMENT | CUTANEOUS | Status: AC
  Administered 2014-07-24: 1
  Filled 2014-07-24: qty 22

## 2014-07-24 MED ORDER — ACETAMINOPHEN 325 MG PO TABS
ORAL_TABLET | ORAL | Status: AC
  Filled 2014-07-24: qty 2

## 2014-07-24 MED ORDER — ONDANSETRON HCL 4 MG/2ML IJ SOLN: 4.0000 mg | Freq: Four times a day (QID) | INTRAMUSCULAR | Status: DC | PRN

## 2014-07-24 MED ORDER — MUPIROCIN 2 % EX OINT
1.0000 "application " | TOPICAL_OINTMENT | Freq: Once | CUTANEOUS | Status: DC
  Filled 2014-07-24: qty 22

## 2014-07-24 NOTE — Op Note (Signed)
Procedure report  Procedure performed:  1. Single chamber pacemaker generator changeout  2. Light sedation  Reason for procedure:  1. Device generator at elective replacement interval  2. Atrial fibrillation with slow ventricular response Procedure performed by:  [name], MD  Complications:  None  Estimated blood loss:  <5 mL  Medications administered during procedure:  Ancef 2 g intravenously,  lidocaine 1% 30 mL locally, fentanyl 25 mcg intravenously, Versed 1 mg intravenously Device details:   New Generator Medtronic [place] model number [id number], serial number [id number] Right ventricular lead (chronic)  Medtronic, model number [id number], serial number [id number] (implanted 12/15/2006)    Explanted generator Medtronic [id number],  Serial number [id number], (implanted 12/15/2006)  Procedure details:  After the risks and benefits of the procedure were discussed the patient provided informed consent. She was brought to the cardiac catheter lab in the fasting state. The patient was prepped and draped in usual sterile fashion. Local anesthesia with 1% lidocaine was administered to to the left infraclavicular area. A 5-6cm horizontal incision was made parallel with and 2-3 cm caudal to the left clavicle, in the area of an old scar. An older scar was seen closer to the left clavicle. Using minimal electrocautery and mostly sharp and blunt dissection the prepectoral pocket was opened carefully to avoid injury to the loops of chronic leads. Extensive dissection was not necessary. The device was explanted. The pocket was carefully inspected for hemostasis and flushed with copious amounts of antibiotic solution.  The leads were disconnected from the old generator and testing of the lead parameters later showed excellent values. The new generator was connected to the chronic leads, with appropriate pacing noted.   The entire system was then carefully inserted in the pocket with care been taking that  the leads and device assumed a comfortable position without pressure on the incision. Great care was taken that the leads be located deep to the generator. The pocket was then closed in layers using 2 layers of 2-0 Vicryl and cutaneous staples after which a sterile dressing was applied.   At the end of the procedure the following lead parameters were encountered:  Right ventricular lead sensed R waves  12.1 mV, impedance 464 ohms, threshold 0.6 at 0.5 ms pulse width.   [name], MD, [facility] [phone] office [phone] pager

## 2014-07-24 NOTE — H&P (View-Only) (Signed)
Patient ID: CATE ORAVEC, female   DOB: 12/28/1940, 73 y.o.   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 Houchin has had the gradual onset of lower extremity edema and exertional dyspnea over the last several weeks. She has gained 8 pounds since last year, 5-1/2 pounds since March and this may merely be fluid retention. She has NYHA functional class II exertional dyspnea. Edema is persistent even after overnight recumbent position.  She has normal coronary arteries by previous angiography (2004) normal left ventricular systolic function by echocardiography.  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 Jude in 1998). She has a history of serious bleeding complications on warfarin. In December 2012 she had subarachnoid hemorrhage and in June 2013 she had intrahepatic hemorrhage requiring arterial embolization and we are keeping her INR in the lower range (2.0-2.5). She also has permanent atrial fibrillation with satisfactory rate control and a single-chamber permanent pacemaker (Medtronic Adapta 2008) for episodes of bradycardia/slow ventricular response. Interrogation of the device today shows that it reached elective replacement indicator last month. The prevalence of ventricular pacing is around 75%, similar to her historical trend. Despite the high process of ventricular pacing she has still had 100 episodes of ventricular high rate recorded all of which probably represent atrial fibrillation rapid ventricular response. The longest episode was just over 2 minutes in duration.   Allergies  Allergen Reactions  . Codeine Nausea And Vomiting    Current Outpatient Prescriptions  Medication Sig Dispense Refill  . Biotin 1000 MCG tablet Take 1,000 mcg by mouth daily.      . calcium  carbonate (OS-CAL) 600 MG TABS tablet Take 600 mg by mouth daily.      Marland Kitchen diltiazem (CARTIA XT) 120 MG 24 hr capsule Take 1 capsule (120 mg total) by mouth daily.  90 capsule  3  . folic acid (FOLVITE) 371 MCG tablet Take 400 mcg by mouth every other day.      . metoprolol (LOPRESSOR) 50 MG tablet TAKE 1 TABLET BY MOUTH TWICE DAILY  60 tablet  0  . Multiple Vitamin (MULTIVITAMINS PO) Take by mouth daily.      . Omega-3 Fatty Acids (FISH OIL) 1000 MG CAPS Take 1,000 mg by mouth daily.      Marland Kitchen OVER THE COUNTER MEDICATION Place 1 drop into both eyes 2 (two) times daily. Drops for dry eyes      . polyethylene glycol (MIRALAX / GLYCOLAX) packet Take 17 g by mouth as needed (for constipation).       . warfarin (COUMADIN) 5 MG tablet TAKE 1 TABLET BY MOUTH DAILY AS DIRECTED  90 tablet  0  . furosemide (LASIX) 20 MG tablet Take 1 tablet (20 mg total) by mouth daily.  30 tablet  6   No current facility-administered medications for this visit.    Past Medical History  Diagnosis Date  . Hypertension   . Atrial fibrillation     normal coronaries cath 2004. Dr Rex Kras [facility]   . Sick sinus syndrome     Dr Crissie Sickles. EP study negative. Pacemaker 12/15/06 Medtronic  . Hyperlipidemia   . Anxiety   . Diverticula of colon   . Hemorrhage intraabdominal 03/17/2012  . 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 Jude mechanical prosthesis. LA severe dilated  .  Subdural hematoma   . Liver hemorrhage   . Atrial flutter   . Pacemaker     medtronic adapta  . Heart murmur   . CHF (congestive heart failure)   . Pneumonia 2009    resolved.? OPD Rx  . Exertional shortness of breath   . History of blood transfusion     "once" (03/15/2013)  . GERD (gastroesophageal reflux disease)   . Migraines   . Stroke     "they say I had a stroke last year" denies residual on 03/15/2013  . Arthritis     "right shoulder" (03/15/2013)  . Anticoagulated on Coumadin     for mech valve and atrial fib  goal  2.0-2.5    Past Surgical History  Procedure Laterality Date  . Mitral valve replacement  1998    St Jude mechanical; Dr. [name]  . Appendectomy    . Tubal ligation    . Tee without cardioversion  09/23/2011    Procedure: TRANSESOPHAGEAL ECHOCARDIOGRAM (TEE);  Surgeon: [name];  Location: [facility];  Service: Cardiovascular;  Laterality: N/A;  . Insert / replace / remove pacemaker  12/15/2006    Medtronic  . US echocardiography  11/19/2011    EF 50-55%,RA mod to severely dilated,LA severely dilated,trace MR,small vegetation or mass on the MV,AOV mildly scleroticmild PI, RV pressure 40-64mmHg  . Persantine cardiolite  08/07/03    mild inf. ischemia   . Tonsillectomy    . Exploratory laparotomy      "had a growth on my intestines" (03/15/2013)  . Cataract extraction w/ intraocular lens  implant, bilateral Bilateral 2013  . Dilation and curettage of uterus      "had fibroids" (03/15/2013)  . Cardiac catheterization  04/02/97    R&L:severe MR/pulmonary hypertension  . Hammer toe surgery Right   . Pacemaker placement  12/15/06    medtronic adapta for SSS    Family History  Problem Relation Age of Onset  . Cancer Mother   . Stroke Father   . Cancer Sister   . Leukemia Sister   . Healthy Brother   . Healthy Sister   . Diabetes Sister   . Healthy Brother   . Healthy Brother     History   Social History  . Marital Status: Married    Spouse Name: N/A    Number of Children: N/A  . Years of Education: N/A   Occupational History  . Not on file.   Social History Main Topics  . Smoking status: Never Smoker   . Smokeless tobacco: Never Used  . Alcohol Use: No  . Drug Use: No  . Sexual Activity: No   Other Topics Concern  . Not on file   Social History Narrative  . No narrative on file    Review of systems: The patient specifically denies any chest pain at rest or with exertion, dyspnea at rest, orthopnea, paroxysmal nocturnal dyspnea, syncope, palpitations,  focal neurological deficits, intermittent claudication, lower extremity edema, unexplained weight gain, cough, hemoptysis or wheezing.  The patient also denies abdominal pain, nausea, vomiting, dysphagia, diarrhea, constipation, polyuria, polydipsia, dysuria, hematuria, frequency, urgency, abnormal bleeding or bruising, fever, chills, unexpected weight changes, mood swings, change in skin or hair texture, change in voice quality, auditory or visual problems, allergic reactions or rashes, new musculoskeletal complaints other than usual "aches and pains".   PHYSICAL EXAM BP 158/88  Pulse 75  Ht 5\' 1"  (1.549 m)  Wt 69.264 kg (152 lb 11.2 oz)  BMI  28.87 kg/m2 General: Alert, oriented x3, no distress  Head: no evidence of trauma, PERRL, EOMI, no exophtalmos or lid lag, no myxedema, no xanthelasma; normal ears, nose and oropharynx  Neck: normal jugular venous pulsations and no hepatojugular reflux; brisk carotid pulses without delay and no carotid bruits  Chest: clear to auscultation, no signs of consolidation by percussion or palpation, normal fremitus, symmetrical and full respiratory excursions, sternotomy scar, healthy left subclavian pacemaker site  Cardiovascular: normal position and quality of the apical impulse, irregular rhythm, normal first and paradoxically split second heart sounds, no rubs or gallops, crisp prosthetic valve sounds, grade 1/6 diastolic rumble at the apex  Abdomen: no tenderness or distention, no masses by palpation, no abnormal pulsatility or arterial bruits, normal bowel sounds, no hepatosplenomegaly  Extremities: no clubbing, cyanosis or edema; 2+ radial, ulnar and brachial pulses bilaterally; 2+ right femoral, posterior tibial and dorsalis pedis pulses; 2+ left femoral, posterior tibial and dorsalis pedis pulses; no subclavian or femoral bruits  Neurological: grossly nonfocal   EKG: atrial fibrillation, ventricular pacing  Lipid Panel  No results found for this  basename: chol, trig, hdl, cholhdl, vldl, ldlcalc, ldldirect    BMET    Component Value Date/Time   NA 136 03/21/2013 0858   NA 138 03/14/2010 1318   K 4.6 03/21/2013 0858   K 4.1 03/14/2010 1318   CL 103 03/21/2013 0858   CL 100 03/14/2010 1318   CO2 23 03/21/2013 0858   CO2 27 03/14/2010 1318   GLUCOSE 86 03/21/2013 0858   GLUCOSE 101 03/14/2010 1318   BUN 14 03/21/2013 0858   BUN 14 03/14/2010 1318   CREATININE 0.75 03/21/2013 0858   CREATININE 0.63 10/28/2011 1453   CALCIUM 9.8 03/21/2013 0858   CALCIUM 9.9 03/14/2010 1318   GFRNONAA 83* 03/21/2013 0858   GFRAA >90 03/21/2013 0858     ASSESSMENT AND PLAN  Mrs. Meth has evidence of acute on chronic diastolic heart failure. I recommended that she start treatment with a diuretic and she should undergo an echocardiogram to see if there is a change left ventricular function or evidence of increased gradients across her mitral valve prosthesis. Ventricular rate control is adequate and her chronic arrhythmia is unlikely to the the culprit. She has always had a high prevalence of ventricular pacing, but the possible need for future upgrade to resynchronization pacemaker should be good. She simultaneously needs to have a relatively slow heart rate secondary to the intrinsically stenotic mitral valve prosthesis, while also avoiding excessive ventricular pacing. This may not be possible with the erratic nature of atrial fibrillation.  We'll also need to schedule the change of her generator. Ideally will have reevaluation of left ventricular ejection fraction before that. Generator change will be preferably done while fully anticoagulated with warfarin, due to the increased risk of thrombotic/embolic complications with her mechanical mitral valve.  She is at substantially high risk of bleeding complications with the surgery. While she is not necessary at high risk of infection, this could have devastating consequences in this patient with a mechanical mitral  valve.  Patient Instructions  START Furosemide 20mg  daily.  Your pacemaker battery needs to be changed out MEDTRONIC Single chamber.  This is done at [facility] as an out-patient.     Your physician recommends that you return for lab work in: 5-7 days prior to the pacemaker procedure.   Your physician has requested that you have an echocardiogram. Echocardiography is a painless test that uses sound waves to create  images of your heart. It provides your doctor with information about the size and shape of your heart and how well your heart's chambers and valves are working. This procedure takes approximately one hour. There are no restrictions for this procedure.      Orders Placed This Encounter  Procedures  . APTT  . Protime-INR  . CBC  . Comprehensive metabolic panel  . EKG 12-Lead  . 2D Echocardiogram without contrast  . PACEMAKER GENERATOR CHANGE   Meds ordered this encounter  Medications  . Omega-3 Fatty Acids (FISH OIL) 1000 MG CAPS    Sig: Take 1,000 mg by mouth daily.  . Biotin 1000 MCG tablet    Sig: Take 1,000 mcg by mouth daily.  . furosemide (LASIX) 20 MG tablet    Sig: Take 1 tablet (20 mg total) by mouth daily.    Dispense:  30 tablet    Refill:  [place] Davione Lenker, MD, [facility] [phone] office [phone] pager

## 2014-07-24 NOTE — Progress Notes (Signed)
Pt took tylenol 650 mg 1 hour ago, pain 5/10 down from 7-8/10 and states it is feeling better, pt is not sore on back or breast tissue just painful around pacer pocket. Site is not raised and has scant drainage. Dr Sallyanne Kuster was paged and updated. No orders followed.

## 2014-07-24 NOTE — Discharge Instructions (Signed)

## 2014-07-24 NOTE — Interval H&P Note (Signed)
History and Physical Interval Note:  07/24/2014 1:10 PM  Carrie [name] Carrie [name]  has presented today for surgery, with the diagnosis of eol  The various methods of treatment have been discussed with the patient and family. After consideration of risks, benefits and other options for treatment, the patient has consented to  Procedure(s): PERMANENT PACEMAKER GENERATOR CHANGE (N/A) as a surgical intervention .  The patient's history has been reviewed, patient examined, no change in status, stable for surgery.  I have reviewed the patient's chart and labs.  Questions were answered to the patient's satisfaction.     [name]

## 2014-07-25 ENCOUNTER — Ambulatory Visit: Payer: Commercial Managed Care - HMO | Admitting: Pharmacist Clinician (PhC)/ Clinical Pharmacy Specialist

## 2014-07-25 ENCOUNTER — Telehealth: Payer: Self-pay | Admitting: Cardiovascular Disease

## 2014-07-30 NOTE — Telephone Encounter (Signed)
Closed encounter °

## 2014-07-31 ENCOUNTER — Other Ambulatory Visit: Payer: Self-pay | Admitting: Cardiovascular Disease

## 2014-07-31 NOTE — Telephone Encounter (Signed)
Rx was sent to pharmacy electronically. 

## 2014-08-07 ENCOUNTER — Ambulatory Visit (INDEPENDENT_AMBULATORY_CARE_PROVIDER_SITE_OTHER): Payer: Commercial Managed Care - HMO | Admitting: *Deleted

## 2014-08-07 DIAGNOSIS — I495 Sick sinus syndrome: Secondary | ICD-10-CM

## 2014-08-07 DIAGNOSIS — I482 Chronic atrial fibrillation, unspecified: Secondary | ICD-10-CM

## 2014-08-07 LAB — MDC_IDC_ENUM_SESS_TYPE_INCLINIC
Date Time Interrogation Session: 20151117125441
Lead Channel Impedance Value: 0 Ohm
Lead Channel Impedance Value: 445 Ohm
Lead Channel Pacing Threshold Amplitude: 0.75 V
Lead Channel Pacing Threshold Pulse Width: 0.4 ms
MDC IDC MSMT BATTERY IMPEDANCE: 100 Ohm
MDC IDC MSMT BATTERY REMAINING LONGEVITY: 126 mo
MDC IDC MSMT BATTERY VOLTAGE: 2.79 V
MDC IDC MSMT LEADCHNL RV SENSING INTR AMPL: 11.2 mV
MDC IDC SET LEADCHNL RV PACING AMPLITUDE: 2 V
MDC IDC SET LEADCHNL RV PACING PULSEWIDTH: 0.4 ms
MDC IDC SET LEADCHNL RV SENSING SENSITIVITY: 4 mV
MDC IDC STAT BRADY RV PERCENT PACED: 73 %

## 2014-08-07 NOTE — Progress Notes (Signed)
Wound check appointment. Staples removed. Wound without redness or edema. Incision edges approximated, wound well healed. Normal device function. Threshold, sensing, and impedance consistent with implant measurements. Device programmed at chronic output setting. Histogram distribution appropriate for patient and level of activity. No high ventricular rates noted. Patient educated about wound care, arm mobility, lifting restrictions. ROV w/ Dr. Sallyanne Kuster 11/13/14 (pt knows to go to [facility]).

## 2014-08-21 ENCOUNTER — Encounter: Payer: Self-pay | Admitting: Cardiovascular Disease

## 2014-08-24 ENCOUNTER — Ambulatory Visit (INDEPENDENT_AMBULATORY_CARE_PROVIDER_SITE_OTHER): Payer: Commercial Managed Care - HMO | Admitting: Pharmacist Clinician (PhC)/ Clinical Pharmacy Specialist

## 2014-08-24 DIAGNOSIS — Z7901 Long term (current) use of anticoagulants: Secondary | ICD-10-CM

## 2014-08-24 DIAGNOSIS — Z954 Presence of other heart-valve replacement: Secondary | ICD-10-CM

## 2014-08-24 DIAGNOSIS — I482 Chronic atrial fibrillation, unspecified: Secondary | ICD-10-CM

## 2014-08-24 DIAGNOSIS — Z952 Presence of prosthetic heart valve: Secondary | ICD-10-CM

## 2014-08-24 LAB — POCT INR: INR: 2.2

## 2014-08-25 ENCOUNTER — Other Ambulatory Visit: Payer: Self-pay | Admitting: Pharmacist Clinician (PhC)/ Clinical Pharmacy Specialist

## 2014-08-27 ENCOUNTER — Other Ambulatory Visit: Payer: Self-pay | Admitting: *Deleted

## 2014-08-30 ENCOUNTER — Encounter (HOSPITAL_COMMUNITY): Payer: Self-pay | Admitting: Cardiovascular Disease

## 2014-09-04 ENCOUNTER — Telehealth: Payer: Self-pay | Admitting: Hematology and Oncology

## 2014-09-04 NOTE — Telephone Encounter (Signed)
, °

## 2014-09-17 ENCOUNTER — Ambulatory Visit (INDEPENDENT_AMBULATORY_CARE_PROVIDER_SITE_OTHER): Payer: Commercial Managed Care - HMO | Admitting: Pharmacist Clinician (PhC)/ Clinical Pharmacy Specialist

## 2014-09-17 DIAGNOSIS — I482 Chronic atrial fibrillation, unspecified: Secondary | ICD-10-CM

## 2014-09-17 DIAGNOSIS — Z952 Presence of prosthetic heart valve: Secondary | ICD-10-CM

## 2014-09-17 DIAGNOSIS — Z7901 Long term (current) use of anticoagulants: Secondary | ICD-10-CM

## 2014-09-17 DIAGNOSIS — Z954 Presence of other heart-valve replacement: Secondary | ICD-10-CM

## 2014-09-17 LAB — POCT INR: INR: 2.4

## 2014-10-29 ENCOUNTER — Ambulatory Visit (INDEPENDENT_AMBULATORY_CARE_PROVIDER_SITE_OTHER): Payer: Medicare Other | Admitting: Pharmacist Clinician (PhC)/ Clinical Pharmacy Specialist

## 2014-10-29 DIAGNOSIS — I482 Chronic atrial fibrillation, unspecified: Secondary | ICD-10-CM

## 2014-10-29 DIAGNOSIS — Z954 Presence of other heart-valve replacement: Secondary | ICD-10-CM

## 2014-10-29 DIAGNOSIS — Z7901 Long term (current) use of anticoagulants: Secondary | ICD-10-CM

## 2014-10-29 DIAGNOSIS — Z952 Presence of prosthetic heart valve: Secondary | ICD-10-CM

## 2014-10-29 LAB — POCT INR: INR: 2.4

## 2014-11-13 ENCOUNTER — Encounter: Payer: Self-pay | Admitting: Cardiovascular Disease

## 2014-11-13 ENCOUNTER — Ambulatory Visit (INDEPENDENT_AMBULATORY_CARE_PROVIDER_SITE_OTHER): Payer: Medicare Other | Admitting: Cardiovascular Disease

## 2014-11-13 VITALS — BP 151/92 | HR 65 | Resp 16 | Ht 61.0 in | Wt 152.2 lb

## 2014-11-13 DIAGNOSIS — Z954 Presence of other heart-valve replacement: Secondary | ICD-10-CM | POA: Diagnosis not present

## 2014-11-13 DIAGNOSIS — Z952 Presence of prosthetic heart valve: Secondary | ICD-10-CM

## 2014-11-13 DIAGNOSIS — I4891 Unspecified atrial fibrillation: Secondary | ICD-10-CM | POA: Diagnosis not present

## 2014-11-13 DIAGNOSIS — I482 Chronic atrial fibrillation, unspecified: Secondary | ICD-10-CM

## 2014-11-13 DIAGNOSIS — I10 Essential (primary) hypertension: Secondary | ICD-10-CM

## 2014-11-13 DIAGNOSIS — Z950 Presence of cardiac pacemaker: Secondary | ICD-10-CM | POA: Diagnosis not present

## 2014-11-13 LAB — MDC_IDC_ENUM_SESS_TYPE_REMOTE
Battery Impedance: 112 Ohm
Battery Remaining Longevity: 121 mo
Battery Voltage: 2.79 V
Date Time Interrogation Session: 20160223143956
Lead Channel Impedance Value: 431 Ohm
Lead Channel Pacing Threshold Amplitude: 0.75 V
Lead Channel Pacing Threshold Pulse Width: 0.4 ms
Lead Channel Sensing Intrinsic Amplitude: 8 mV
MDC IDC MSMT LEADCHNL RA IMPEDANCE VALUE: 0 Ohm
MDC IDC SET LEADCHNL RV PACING AMPLITUDE: 2 V
MDC IDC SET LEADCHNL RV PACING PULSEWIDTH: 0.4 ms
MDC IDC SET LEADCHNL RV SENSING SENSITIVITY: 4 mV
MDC IDC STAT BRADY RV PERCENT PACED: 78 %

## 2014-11-13 MED ORDER — CHLORTHALIDONE 25 MG PO TABS: 12.5000 mg | ORAL_TABLET | Freq: Every day | ORAL | Status: DC

## 2014-11-13 NOTE — Patient Instructions (Signed)
Remote monitoring is used to monitor your pacemaker from home. This monitoring reduces the number of office visits required to check your device to one time per year. It allows [place] to keep an eye on the functioning of your device to ensure it is working properly. You are scheduled for a device check from home on 02-12-2015. You may send your transmission at any time that day. If you have a wireless device, the transmission will be sent automatically. After your physician reviews your transmission, you will receive a postcard with your next transmission date.  Your physician recommends that you schedule a follow-up appointment in: 6 months with Dr.Croitoru  Your physician has recommended you make the following change in your medication: START CHLORTHALIDONE 25 MG---TAKE 1/2 TABLET (12.5MG ) ONCE DAILY  Your physician has requested that you regularly monitor and record your blood pressure readings at home. Please use the same machine at the same time of day to check your readings and CALL us WITH THE RESULTS IN 2 WEEKS.

## 2014-11-14 DIAGNOSIS — I110 Hypertensive heart disease with heart failure: Secondary | ICD-10-CM | POA: Diagnosis not present

## 2014-11-14 DIAGNOSIS — M1380 Other specified arthritis, unspecified site: Secondary | ICD-10-CM | POA: Diagnosis not present

## 2014-11-14 DIAGNOSIS — F5101 Primary insomnia: Secondary | ICD-10-CM | POA: Diagnosis not present

## 2014-11-15 ENCOUNTER — Encounter: Payer: Self-pay | Admitting: Cardiovascular Disease

## 2014-11-15 NOTE — Progress Notes (Signed)
Patient ID: Carrie [name], female   DOB: December 31, 1940, 74 y.o.   MRN: [id number]     Reason for office visit Permanent atrial fibrillation, pacemaker follow-up, mechanical mitral valve prosthesis, hypertension  Carrie [name] complains of a sensation of fatigue over last 1-2 weeks. Her blood pressure has been consistently elevated with systolics are generally over 150 at home. Today her blood pressure is 151/92 mmHg. She is on diltiazem and metoprolol both for her blood pressure and for ventricular rate control. She has roughly 70% ventricular pacing. No ventricular high rates are seen on today's interrogation, although these were common on past interrogations and rate control medicines had to be increased.  Denies any bleeding problems or neurological events  She has a remote history of mitral valve endocarditis and received a mechanical mitral valve prosthesis (29 mm St. Jude in 1998). She has a history of serious bleeding complications on warfarin. In December 2012 she had subarachnoid hemorrhage and in June 2013 she had intrahepatic hemorrhage requiring arterial embolization and we are keeping her INR in the lower range (2.0-2.5). She also has permanent atrial fibrillation with satisfactory rate control and a single-chamber permanent pacemaker (Medtronic [place], implant initially 2008, pacemaker generator change out 2015) for episodes of bradycardia/slow ventricular response. The prevalence of ventricular pacing is around 72%, similar to her historical trend. Despite the high percentage of ventricular pacing she has still had episodes of ventricular high rate recorded all of which probably represent atrial fibrillation with rapid ventricular response so she is on both calcium channel blockers and metoprolol. She has normal coronary arteries by previous angiography (2004) normal left ventricular systolic function by echocardiography.  Allergies  Allergen Reactions  . Codeine Nausea And Vomiting     Current Outpatient Prescriptions  Medication Sig Dispense Refill  . Biotin 1000 MCG tablet Take 1,000 mcg by mouth daily.    . calcium carbonate (OS-CAL) 600 MG TABS tablet Take 600 mg by mouth daily.    Marland Kitchen diltiazem (CARTIA XT) 120 MG 24 hr capsule Take 1 capsule (120 mg total) by mouth daily. 90 capsule 3  . folic acid (FOLVITE) 027 MCG tablet Take 400 mcg by mouth every other day.    . metoprolol (LOPRESSOR) 50 MG tablet TAKE 1 TABLET BY MOUTH TWICE DAILY 60 tablet 10  . Multiple Vitamin (MULTIVITAMINS PO) Take 1 tablet by mouth daily.     . Omega-3 Fatty Acids (FISH OIL) 1000 MG CAPS Take 1,000 mg by mouth daily.    Marland Kitchen OVER THE COUNTER MEDICATION Place 1 drop into both eyes 2 (two) times daily. Drops for dry eyes    . polyethylene glycol (MIRALAX / GLYCOLAX) packet Take 17 g by mouth as needed (for constipation).     . warfarin (COUMADIN) 5 MG tablet TAKE 1 TABLET BY MOUTH DAILY AS DIRECTED 90 tablet 1  . chlorthalidone (HYGROTON) 25 MG tablet Take 0.5 tablets (12.5 mg total) by mouth daily. 15 tablet 3   No current facility-administered medications for this visit.    Past Medical History  Diagnosis Date  . Hypertension   . Atrial fibrillation     normal coronaries cath 2004. Dr Rex Kras [facility]   . Sick sinus syndrome     Dr Crissie Sickles. EP study negative. Pacemaker 12/15/06 Medtronic  . Hyperlipidemia   . Anxiety   . Diverticula of colon   . Hemorrhage intraabdominal 03/17/2012  . 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 Jude mechanical prosthesis. LA severe dilated  .  Subdural hematoma   . Liver hemorrhage   . Atrial flutter   . Pacemaker     medtronic adapta  . Heart murmur   . CHF (congestive heart failure)   . Pneumonia 2009    resolved.? OPD Rx  . Exertional shortness of breath   . History of blood transfusion     "once" (03/15/2013)  . GERD (gastroesophageal reflux disease)   . Migraines   . Stroke     "they say I had a stroke last year"  denies residual on 03/15/2013  . Arthritis     "right shoulder" (03/15/2013)  . Anticoagulated on Coumadin     for mech valve and atrial fib  goal 2.0-2.5    Past Surgical History  Procedure Laterality Date  . Mitral valve replacement  1998    St Jude mechanical; Dr. [name]  . Appendectomy    . Tubal ligation    . Tee without cardioversion  09/23/2011    Procedure: TRANSESOPHAGEAL ECHOCARDIOGRAM (TEE);  Surgeon: [name];  Location: [facility];  Service: Cardiovascular;  Laterality: N/A;  . Insert / replace / remove pacemaker  12/15/2006    Medtronic  . US echocardiography  11/19/2011    EF 50-55%,RA mod to severely dilated,LA severely dilated,trace MR,small vegetation or mass on the MV,AOV mildly scleroticmild PI, RV pressure 40-44mmHg  . Persantine cardiolite  08/07/03    mild inf. ischemia   . Tonsillectomy    . Exploratory laparotomy      "had a growth on my intestines" (03/15/2013)  . Cataract extraction w/ intraocular lens  implant, bilateral Bilateral 2013  . Dilation and curettage of uterus      "had fibroids" (03/15/2013)  . Cardiac catheterization  04/02/97    R&L:severe MR/pulmonary hypertension  . Hammer toe surgery Right   . Pacemaker placement  12/15/06    medtronic adapta for SSS  . Permanent pacemaker generator change N/A 07/24/2014    Procedure: PERMANENT PACEMAKER GENERATOR CHANGE;  Surgeon: [name], MD;  Location: [place] [facility];  Service: Cardiovascular;  Laterality: N/A;    Family History  Problem Relation Age of Onset  . Cancer Mother   . Stroke Father   . Cancer Sister   . Leukemia Sister   . Healthy Brother   . Healthy Sister   . Diabetes Sister   . Healthy Brother   . Healthy Brother     History   Social History  . Marital Status: Married    Spouse Name: N/A  . Number of Children: N/A  . Years of Education: N/A   Occupational History  . Not on file.   Social History Main Topics  . Smoking status: Never Smoker   . Smokeless  tobacco: Never Used  . Alcohol Use: No  . Drug Use: No  . Sexual Activity: No   Other Topics Concern  . Not on file   Social History Narrative    Review of systems: The patient specifically denies any chest pain at rest or with exertion, dyspnea at rest or with exertion, orthopnea, paroxysmal nocturnal dyspnea, syncope, palpitations, focal neurological deficits, intermittent claudication, lower extremity edema, unexplained weight gain, cough, hemoptysis or wheezing.  The patient also denies abdominal pain, nausea, vomiting, dysphagia, diarrhea, constipation, polyuria, polydipsia, dysuria, hematuria, frequency, urgency, abnormal bleeding or bruising, fever, chills, unexpected weight changes, mood swings, change in skin or hair texture, change in voice quality, auditory or visual problems, allergic reactions or rashes, new musculoskeletal complaints other than usual "  aches and pains".   PHYSICAL EXAM BP 151/92 mmHg  Pulse 65  Resp 16  Ht 5\' 1"  (1.549 m)  Wt 152 lb 3.2 oz (69.037 kg)  BMI 28.77 kg/m2 General: Alert, oriented x3, no distress  Head: no evidence of trauma, PERRL, EOMI, no exophtalmos or lid lag, no myxedema, no xanthelasma; normal ears, nose and oropharynx  Neck: normal jugular venous pulsations and no hepatojugular reflux; brisk carotid pulses without delay and no carotid bruits  Chest: clear to auscultation, no signs of consolidation by percussion or palpation, normal fremitus, symmetrical and full respiratory excursions, sternotomy scar, healthy left subclavian pacemaker site  Cardiovascular: normal position and quality of the apical impulse, irregular rhythm, normal first and paradoxically split second heart sounds, no rubs or gallops, crisp prosthetic valve sounds, grade 1/6 diastolic rumble at the apex  Abdomen: no tenderness or distention, no masses by palpation, no abnormal pulsatility or arterial bruits, normal bowel sounds, no hepatosplenomegaly  Extremities:  no clubbing, cyanosis or edema; 2+ radial, ulnar and brachial pulses bilaterally; 2+ right femoral, posterior tibial and dorsalis pedis pulses; 2+ left femoral, posterior tibial and dorsalis pedis pulses; no subclavian or femoral bruits  Neurological: grossly nonfocal   EKG: atrial fibrillation, ventricular pacing  Lipid Panel  No results found for: CHOL, TRIG, HDL, CHOLHDL, VLDL, LDLCALC, LDLDIRECT  BMET    Component Value Date/Time   NA 136 07/18/2014 1332   NA 138 03/14/2010 1318   K 4.1 07/18/2014 1332   K 4.1 03/14/2010 1318   CL 102 07/18/2014 1332   CL 100 03/14/2010 1318   CO2 25 07/18/2014 1332   CO2 27 03/14/2010 1318   GLUCOSE 86 07/18/2014 1332   GLUCOSE 101 03/14/2010 1318   BUN 14 07/18/2014 1332   BUN 14 03/14/2010 1318   CREATININE 0.94 07/18/2014 1332   CREATININE 0.75 03/21/2013 0858   CALCIUM 9.9 07/18/2014 1332   CALCIUM 9.9 03/14/2010 1318   GFRNONAA 83* 03/21/2013 0858   GFRAA >90 03/21/2013 0858     ASSESSMENT AND PLAN  I'm not sure of the exact etiology of Carrie [name]'s fatigue. She has a fairly high percentage of ventricular pacing, but this is very similar to previous historical trend. In fact we had to increase AV blocking medications due to frequent episodes of RVR not too long ago. It is felt that she would benefit from relatively slow heart rates anyway since she has an intrinsically stenotic mitral valve prosthesis. Her blood pressure is high. She does not appear to be anemic by clinical exam. Her hemoglobin was 14.1 about 3 months ago.  No changes are made to her current pacemaker settings. Device function appears to be normal.  Clinically there is no evidence of mitral valve prosthesis dysfunction. Her INR has been in the therapeutic range very consistently. Most recently INR was 2.4.  She has previously had diastolic heart failure, not evident on today's exam. Will add a low dose of thiazide diuretic for blood pressure control and reevaluate  in several weeks.  Orders Placed This Encounter  Procedures  . Implantable device - remote   Meds ordered this encounter  Medications  . chlorthalidone (HYGROTON) 25 MG tablet    Sig: Take 0.5 tablets (12.5 mg total) by mouth daily.    Dispense:  15 tablet    Refill:  Metz Keoki Mchargue, MD, [facility] [phone] office [phone] pager

## 2014-11-21 ENCOUNTER — Other Ambulatory Visit: Payer: Self-pay | Admitting: Cardiovascular Disease

## 2014-11-21 ENCOUNTER — Other Ambulatory Visit: Payer: Self-pay | Admitting: *Deleted

## 2014-11-21 MED ORDER — DILTIAZEM HCL ER COATED BEADS 120 MG PO CP24: 120.0000 mg | ORAL_CAPSULE | Freq: Every day | ORAL | Status: DC

## 2014-11-21 NOTE — Telephone Encounter (Signed)
Rx(s) sent to pharmacy electronically.  

## 2014-11-29 DIAGNOSIS — Z961 Presence of intraocular lens: Secondary | ICD-10-CM | POA: Diagnosis not present

## 2014-11-29 DIAGNOSIS — H26493 Other secondary cataract, bilateral: Secondary | ICD-10-CM | POA: Diagnosis not present

## 2014-11-29 DIAGNOSIS — H04123 Dry eye syndrome of bilateral lacrimal glands: Secondary | ICD-10-CM | POA: Diagnosis not present

## 2014-11-30 ENCOUNTER — Other Ambulatory Visit: Payer: Self-pay

## 2014-11-30 DIAGNOSIS — Z952 Presence of prosthetic heart valve: Secondary | ICD-10-CM

## 2014-12-03 ENCOUNTER — Other Ambulatory Visit (HOSPITAL_BASED_OUTPATIENT_CLINIC_OR_DEPARTMENT_OTHER): Payer: Medicare Other

## 2014-12-03 ENCOUNTER — Ambulatory Visit (HOSPITAL_BASED_OUTPATIENT_CLINIC_OR_DEPARTMENT_OTHER): Payer: Medicare Other | Admitting: Hematology and Oncology

## 2014-12-03 ENCOUNTER — Telehealth: Payer: Self-pay | Admitting: Hematology and Oncology

## 2014-12-03 VITALS — BP 146/84 | HR 92 | Temp 97.5°F | Resp 18 | Ht 61.0 in | Wt 151.4 lb

## 2014-12-03 DIAGNOSIS — D890 Polyclonal hypergammaglobulinemia: Secondary | ICD-10-CM

## 2014-12-03 DIAGNOSIS — D696 Thrombocytopenia, unspecified: Secondary | ICD-10-CM

## 2014-12-03 DIAGNOSIS — Z952 Presence of prosthetic heart valve: Secondary | ICD-10-CM

## 2014-12-03 LAB — CBC WITH DIFFERENTIAL/PLATELET
BASO%: 1.1 % (ref 0.0–2.0)
Basophils Absolute: 0.1 10*3/uL (ref 0.0–0.1)
EOS%: 4.4 % (ref 0.0–7.0)
Eosinophils Absolute: 0.3 10*3/uL (ref 0.0–0.5)
HEMATOCRIT: 42.8 % (ref 34.8–46.6)
HGB: 14.4 g/dL (ref 11.6–15.9)
LYMPH#: 1.4 10*3/uL (ref 0.9–3.3)
LYMPH%: 24.6 % (ref 14.0–49.7)
MCH: 31.2 pg (ref 25.1–34.0)
MCHC: 33.6 g/dL (ref 31.5–36.0)
MCV: 92.8 fL (ref 79.5–101.0)
MONO#: 0.7 10*3/uL (ref 0.1–0.9)
MONO%: 13.1 % (ref 0.0–14.0)
NEUT#: 3.2 10*3/uL (ref 1.5–6.5)
NEUT%: 56.8 % (ref 38.4–76.8)
Platelets: 118 10*3/uL — ABNORMAL LOW (ref 145–400)
RBC: 4.61 10*6/uL (ref 3.70–5.45)
RDW: 15.5 % — ABNORMAL HIGH (ref 11.2–14.5)
WBC: 5.7 10*3/uL (ref 3.9–10.3)

## 2014-12-03 LAB — COMPREHENSIVE METABOLIC PANEL (CC13)
ALBUMIN: 4 g/dL (ref 3.5–5.0)
ALK PHOS: 106 U/L (ref 40–150)
ALT: 24 U/L (ref 0–55)
AST: 58 U/L — AB (ref 5–34)
Anion Gap: 11 mEq/L (ref 3–11)
BUN: 18.7 mg/dL (ref 7.0–26.0)
CALCIUM: 10.5 mg/dL — AB (ref 8.4–10.4)
CO2: 26 mEq/L (ref 22–29)
Chloride: 102 mEq/L (ref 98–109)
Creatinine: 0.9 mg/dL (ref 0.6–1.1)
EGFR: 71 mL/min/{1.73_m2} — ABNORMAL LOW (ref >90)
Glucose: 77 mg/dl (ref 70–140)
Potassium: 3.9 mEq/L (ref 3.5–5.1)
SODIUM: 139 meq/L (ref 136–145)
TOTAL PROTEIN: 8.9 g/dL — AB (ref 6.4–8.3)
Total Bilirubin: 0.5 mg/dL (ref 0.20–1.20)

## 2014-12-03 NOTE — Assessment & Plan Note (Signed)
Thrombocytopenia platelet count 117 asymptomatic, new onset: I discussed with her that this is the first in her platelet count had gone lower. She was started on chlorthalidone recently. I suspect that it may be related to that.  I reviewed her blood counts and there is no evidence of anemia. Dr. Humphrey Rolls had been following her for low-grade hemolysis. 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 There is no evidence of anemia or hemolysis on the blood counts today.  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 In order to be thorough, I would like to check quantitative immunoglobulins and serum protein phoresis with the next blood work to be done in one year.  Chronic anticoagulation on Coumadin.

## 2014-12-03 NOTE — Telephone Encounter (Signed)
appts made and avs printed for pt  anne °

## 2014-12-03 NOTE — Progress Notes (Signed)
Patient Care Team: Lucianne Lei, MD as PCP - General (Family Medicine)  DIAGNOSIS:  1. Polyclonal gammopathy 2.  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 New onset thrombocytopenia 12/03/2014   CHIEF COMPLIANT:  follow-up of blood counts   INTERVAL HISTORY: Carrie [name] is a 74 year old lady with above-mentioned history of low-grade hemolysis and polyclonal gammopathy who is here for annual follow-up. She reports no new problems or concerns. She was started recently on chlorthalidone. Her platelet count for the first time had dropped down into the 118. she does not complain of bruising bleeding or any other problems.  REVIEW OF SYSTEMS:   Constitutional: Denies fevers, chills or abnormal weight loss Eyes: Denies blurriness of vision Ears, nose, mouth, throat, and face: Denies mucositis or sore throat Respiratory: Denies cough, dyspnea or wheezes Cardiovascular: Denies palpitation, chest discomfort or lower extremity swelling Gastrointestinal:  Denies nausea, heartburn or change in bowel habits Skin: Denies abnormal skin rashes Lymphatics: Denies new lymphadenopathy or easy bruising Neurological:Denies numbness, tingling or new weaknesses Behavioral/Psych: Mood is stable, no new changes  Breast:  denies any pain or lumps or nodules in either breasts All other systems were reviewed with the patient and are negative.  I have reviewed the past medical history, past surgical history, social history and family history with the patient and they are unchanged from previous note.  ALLERGIES:  is allergic to codeine.  MEDICATIONS:  Current Outpatient Prescriptions  Medication Sig Dispense Refill  . Biotin 1000 MCG tablet Take 1,000 mcg by mouth daily.    . calcium carbonate (OS-CAL) 600 MG TABS tablet Take 600 mg by mouth daily.    . chlorthalidone (HYGROTON) 25 MG tablet Take 0.5 tablets (12.5 mg total) by mouth daily. 15 tablet 3  . diltiazem (CARTIA XT) 120 MG 24 hr  capsule Take 1 capsule (120 mg total) by mouth daily. 90 capsule 3  . folic acid (FOLVITE) 638 MCG tablet Take 400 mcg by mouth every other day.    . metoprolol (LOPRESSOR) 50 MG tablet TAKE 1 TABLET BY MOUTH TWICE DAILY 60 tablet 10  . Multiple Vitamin (MULTIVITAMINS PO) Take 1 tablet by mouth daily.     . Omega-3 Fatty Acids (FISH OIL) 1000 MG CAPS Take 1,000 mg by mouth daily.    Marland Kitchen OVER THE COUNTER MEDICATION Place 1 drop into both eyes 2 (two) times daily. Drops for dry eyes    . polyethylene glycol (MIRALAX / GLYCOLAX) packet Take 17 g by mouth as needed (for constipation).     . warfarin (COUMADIN) 5 MG tablet TAKE 1 TABLET BY MOUTH DAILY AS DIRECTED 90 tablet 1   No current facility-administered medications for this visit.    PHYSICAL EXAMINATION: ECOG PERFORMANCE STATUS: 0 - Asymptomatic  Filed Vitals:   12/03/14 1025  BP: 146/84  Pulse: 92  Temp: 97.5 F (36.4 C)  Resp: 18   Filed Weights   12/03/14 1025  Weight: 151 lb 6.4 oz (68.675 kg)    GENERAL:alert, no distress and comfortable SKIN: skin color, texture, turgor are normal, no rashes or significant lesions EYES: normal, Conjunctiva are pink and non-injected, sclera clear OROPHARYNX:no exudate, no erythema and lips, buccal mucosa, and tongue normal  NECK: supple, thyroid normal size, non-tender, without nodularity LYMPH:  no palpable lymphadenopathy in the cervical, axillary or inguinal LUNGS: clear to auscultation and percussion with normal breathing effort HEART: regular rate & rhythm and no murmurs and no lower extremity edema ABDOMEN:abdomen soft, non-tender and normal  bowel sounds Musculoskeletal:no cyanosis of digits and no clubbing  NEURO: alert & oriented x 3 with fluent speech, no focal motor/sensory deficits  LABORATORY DATA:  I have reviewed the data as listed   Chemistry      Component Value Date/Time   NA 139 12/03/2014 0959   NA 136 07/18/2014 1332   NA 138 03/14/2010 1318   K 3.9 12/03/2014  0959   K 4.1 07/18/2014 1332   K 4.1 03/14/2010 1318   CL 102 07/18/2014 1332   CL 100 03/14/2010 1318   CO2 26 12/03/2014 0959   CO2 25 07/18/2014 1332   CO2 27 03/14/2010 1318   BUN 18.7 12/03/2014 0959   BUN 14 07/18/2014 1332   BUN 14 03/14/2010 1318   CREATININE 0.9 12/03/2014 0959   CREATININE 0.94 07/18/2014 1332   CREATININE 0.75 03/21/2013 0858      Component Value Date/Time   CALCIUM 10.5* 12/03/2014 0959   CALCIUM 9.9 07/18/2014 1332   CALCIUM 9.9 03/14/2010 1318   ALKPHOS 106 12/03/2014 0959   ALKPHOS 115 07/18/2014 1332   ALKPHOS 125* 03/14/2010 1318   AST 58* 12/03/2014 0959   AST 51* 07/18/2014 1332   AST 46* 03/14/2010 1318   ALT 24 12/03/2014 0959   ALT 23 07/18/2014 1332   ALT 22 03/14/2010 1318   BILITOT 0.50 12/03/2014 0959   BILITOT 0.5 07/18/2014 1332   BILITOT 0.70 03/14/2010 1318       Lab Results  Component Value Date   WBC 5.7 12/03/2014   HGB 14.4 12/03/2014   HCT 42.8 12/03/2014   MCV 92.8 12/03/2014   PLT 118* 12/03/2014   NEUTROABS 3.2 12/03/2014    ASSESSMENT & PLAN:  Thrombocytopenia Thrombocytopenia platelet count 117 asymptomatic, new onset: I discussed with her that this is the first in her platelet count had gone lower. She was started on chlorthalidone recently. I suspect that it may be related to that.  I reviewed her blood counts and there is no evidence of anemia. Dr. Humphrey Rolls had been following her for low-grade hemolysis. 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 There is no evidence of anemia or hemolysis on the blood counts today.  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 In order to be thorough, I would like to check quantitative immunoglobulins and serum protein phoresis with the next blood work to be done in one year.  Chronic anticoagulation on Coumadin.    Orders Placed This Encounter  Procedures  . CBC with Differential    Standing Status: Future     Number of Occurrences:       Standing Expiration Date: 12/03/2015  . Comprehensive metabolic panel (Cmet) - [facility]    Standing Status: Future     Number of Occurrences:      Standing Expiration Date: 12/03/2015  . SPEP & IFE with [facility]    Standing Status: Future     Number of Occurrences:      Standing Expiration Date: 01/07/2016   The patient has a good understanding of the overall plan. she agrees with it. She will call with any problems that may develop before her next visit here.   Rulon Eisenmenger, MD

## 2014-12-10 ENCOUNTER — Ambulatory Visit (INDEPENDENT_AMBULATORY_CARE_PROVIDER_SITE_OTHER): Payer: Medicare Other | Admitting: Pharmacist Clinician (PhC)/ Clinical Pharmacy Specialist

## 2014-12-10 DIAGNOSIS — Z7901 Long term (current) use of anticoagulants: Secondary | ICD-10-CM | POA: Diagnosis not present

## 2014-12-10 DIAGNOSIS — I482 Chronic atrial fibrillation, unspecified: Secondary | ICD-10-CM

## 2014-12-10 DIAGNOSIS — Z952 Presence of prosthetic heart valve: Secondary | ICD-10-CM

## 2014-12-10 DIAGNOSIS — Z954 Presence of other heart-valve replacement: Secondary | ICD-10-CM | POA: Diagnosis not present

## 2014-12-10 LAB — POCT INR: INR: 2.5

## 2014-12-17 DIAGNOSIS — Z0000 Encounter for general adult medical examination without abnormal findings: Secondary | ICD-10-CM | POA: Diagnosis not present

## 2014-12-17 DIAGNOSIS — M1380 Other specified arthritis, unspecified site: Secondary | ICD-10-CM | POA: Diagnosis not present

## 2014-12-17 DIAGNOSIS — I10 Essential (primary) hypertension: Secondary | ICD-10-CM | POA: Diagnosis not present

## 2015-01-21 ENCOUNTER — Ambulatory Visit (INDEPENDENT_AMBULATORY_CARE_PROVIDER_SITE_OTHER): Payer: Medicare Other | Admitting: Pharmacist Clinician (PhC)/ Clinical Pharmacy Specialist

## 2015-01-21 DIAGNOSIS — I482 Chronic atrial fibrillation, unspecified: Secondary | ICD-10-CM

## 2015-01-21 DIAGNOSIS — Z7901 Long term (current) use of anticoagulants: Secondary | ICD-10-CM

## 2015-01-21 DIAGNOSIS — Z952 Presence of prosthetic heart valve: Secondary | ICD-10-CM

## 2015-01-21 DIAGNOSIS — Z954 Presence of other heart-valve replacement: Secondary | ICD-10-CM | POA: Diagnosis not present

## 2015-01-21 LAB — POCT INR: INR: 2.9

## 2015-02-11 ENCOUNTER — Ambulatory Visit: Payer: Medicare Other

## 2015-02-12 ENCOUNTER — Ambulatory Visit (INDEPENDENT_AMBULATORY_CARE_PROVIDER_SITE_OTHER): Payer: Medicare Other | Admitting: *Deleted

## 2015-02-12 ENCOUNTER — Telehealth: Payer: Self-pay | Admitting: Cardiovascular Disease

## 2015-02-12 DIAGNOSIS — I495 Sick sinus syndrome: Secondary | ICD-10-CM | POA: Diagnosis not present

## 2015-02-12 NOTE — Telephone Encounter (Signed)
Spoke w/ pt and instructed pt how to send manual transmission. Pt verbalized understanding.

## 2015-02-12 NOTE — Telephone Encounter (Signed)
New Message       Pt calling wanting to know how to do her remote transmission. Please call back and advise.

## 2015-02-12 NOTE — Progress Notes (Signed)
Remote pacemaker transmission.   

## 2015-02-13 ENCOUNTER — Ambulatory Visit (INDEPENDENT_AMBULATORY_CARE_PROVIDER_SITE_OTHER): Payer: Medicare Other | Admitting: Pharmacist Clinician (PhC)/ Clinical Pharmacy Specialist

## 2015-02-13 DIAGNOSIS — Z952 Presence of prosthetic heart valve: Secondary | ICD-10-CM

## 2015-02-13 DIAGNOSIS — I482 Chronic atrial fibrillation, unspecified: Secondary | ICD-10-CM

## 2015-02-13 DIAGNOSIS — Z954 Presence of other heart-valve replacement: Secondary | ICD-10-CM | POA: Diagnosis not present

## 2015-02-13 DIAGNOSIS — Z7901 Long term (current) use of anticoagulants: Secondary | ICD-10-CM

## 2015-02-13 LAB — CUP PACEART REMOTE DEVICE CHECK
Battery Remaining Longevity: 109 mo
Date Time Interrogation Session: 20160524130504
Lead Channel Impedance Value: 433 Ohm
Lead Channel Pacing Threshold Amplitude: 0.875 V
Lead Channel Pacing Threshold Pulse Width: 0.4 ms
Lead Channel Setting Pacing Amplitude: 2 V
Lead Channel Setting Pacing Pulse Width: 0.4 ms
Lead Channel Setting Sensing Sensitivity: 4 mV
MDC IDC MSMT BATTERY IMPEDANCE: 185 Ohm
MDC IDC MSMT BATTERY VOLTAGE: 2.79 V
MDC IDC MSMT LEADCHNL RA IMPEDANCE VALUE: 0 Ohm
MDC IDC STAT BRADY RV PERCENT PACED: 82 %

## 2015-02-13 LAB — POCT INR: INR: 3.1

## 2015-02-21 ENCOUNTER — Other Ambulatory Visit: Payer: Self-pay | Admitting: Cardiovascular Disease

## 2015-02-22 ENCOUNTER — Other Ambulatory Visit: Payer: Self-pay | Admitting: Pharmacist Clinician (PhC)/ Clinical Pharmacy Specialist

## 2015-02-22 ENCOUNTER — Encounter: Payer: Self-pay | Admitting: Cardiology

## 2015-02-22 MED ORDER — WARFARIN SODIUM 5 MG PO TABS: 5.0000 mg | ORAL_TABLET | Freq: Every day | ORAL | Status: DC

## 2015-02-28 ENCOUNTER — Encounter: Payer: Self-pay | Admitting: Cardiovascular Disease

## 2015-03-03 ENCOUNTER — Other Ambulatory Visit: Payer: Self-pay | Admitting: Cardiovascular Disease

## 2015-03-04 ENCOUNTER — Ambulatory Visit (INDEPENDENT_AMBULATORY_CARE_PROVIDER_SITE_OTHER): Payer: Medicare Other | Admitting: Pharmacist Clinician (PhC)/ Clinical Pharmacy Specialist

## 2015-03-04 ENCOUNTER — Other Ambulatory Visit: Payer: Self-pay | Admitting: *Deleted

## 2015-03-04 DIAGNOSIS — I482 Chronic atrial fibrillation, unspecified: Secondary | ICD-10-CM

## 2015-03-04 DIAGNOSIS — Z7901 Long term (current) use of anticoagulants: Secondary | ICD-10-CM

## 2015-03-04 DIAGNOSIS — Z954 Presence of other heart-valve replacement: Secondary | ICD-10-CM | POA: Diagnosis not present

## 2015-03-04 DIAGNOSIS — Z952 Presence of prosthetic heart valve: Secondary | ICD-10-CM

## 2015-03-04 LAB — POCT INR: INR: 2.3

## 2015-03-04 MED ORDER — CHLORTHALIDONE 25 MG PO TABS: 12.5000 mg | ORAL_TABLET | Freq: Every day | ORAL | Status: DC

## 2015-03-04 NOTE — Telephone Encounter (Signed)
Spoke to pharmacist Patient has an active prescription available from 11/2014.

## 2015-03-26 DIAGNOSIS — J398 Other specified diseases of upper respiratory tract: Secondary | ICD-10-CM | POA: Diagnosis not present

## 2015-04-01 ENCOUNTER — Ambulatory Visit (INDEPENDENT_AMBULATORY_CARE_PROVIDER_SITE_OTHER): Payer: Medicare Other | Admitting: Pharmacist Clinician (PhC)/ Clinical Pharmacy Specialist

## 2015-04-01 DIAGNOSIS — I482 Chronic atrial fibrillation, unspecified: Secondary | ICD-10-CM

## 2015-04-01 DIAGNOSIS — Z7901 Long term (current) use of anticoagulants: Secondary | ICD-10-CM

## 2015-04-01 DIAGNOSIS — Z952 Presence of prosthetic heart valve: Secondary | ICD-10-CM

## 2015-04-01 DIAGNOSIS — Z954 Presence of other heart-valve replacement: Secondary | ICD-10-CM

## 2015-04-01 LAB — POCT INR: INR: 2.1

## 2015-04-02 DIAGNOSIS — J398 Other specified diseases of upper respiratory tract: Secondary | ICD-10-CM | POA: Diagnosis not present

## 2015-04-02 DIAGNOSIS — H02536 Eyelid retraction left eye, unspecified eyelid: Secondary | ICD-10-CM | POA: Diagnosis not present

## 2015-04-02 DIAGNOSIS — H1033 Unspecified acute conjunctivitis, bilateral: Secondary | ICD-10-CM | POA: Diagnosis not present

## 2015-04-10 DIAGNOSIS — H10012 Acute follicular conjunctivitis, left eye: Secondary | ICD-10-CM | POA: Diagnosis not present

## 2015-04-29 ENCOUNTER — Ambulatory Visit (INDEPENDENT_AMBULATORY_CARE_PROVIDER_SITE_OTHER): Payer: Medicare Other | Admitting: Pharmacist Clinician (PhC)/ Clinical Pharmacy Specialist

## 2015-04-29 DIAGNOSIS — Z7901 Long term (current) use of anticoagulants: Secondary | ICD-10-CM

## 2015-04-29 DIAGNOSIS — Z954 Presence of other heart-valve replacement: Secondary | ICD-10-CM | POA: Diagnosis not present

## 2015-04-29 DIAGNOSIS — Z952 Presence of prosthetic heart valve: Secondary | ICD-10-CM

## 2015-04-29 DIAGNOSIS — I482 Chronic atrial fibrillation, unspecified: Secondary | ICD-10-CM

## 2015-04-29 LAB — POCT INR: INR: 2.4

## 2015-05-14 ENCOUNTER — Ambulatory Visit (INDEPENDENT_AMBULATORY_CARE_PROVIDER_SITE_OTHER): Payer: Medicare Other | Admitting: *Deleted

## 2015-05-14 DIAGNOSIS — I4891 Unspecified atrial fibrillation: Secondary | ICD-10-CM

## 2015-05-14 NOTE — Patient Instructions (Signed)
Your physician recommends that you follow-up appointment with Dr.Croitoru on 08/12/2015 @ 1:45pm.

## 2015-05-17 LAB — CUP PACEART INCLINIC DEVICE CHECK
Battery Impedance: 185 Ohm
Battery Remaining Longevity: 110 mo
Brady Statistic RV Percent Paced: 80 %
Lead Channel Impedance Value: 417 Ohm
Lead Channel Pacing Threshold Amplitude: 0.75 V
Lead Channel Pacing Threshold Pulse Width: 0.4 ms
Lead Channel Sensing Intrinsic Amplitude: 11.2 mV
Lead Channel Setting Pacing Pulse Width: 0.4 ms
Lead Channel Setting Sensing Sensitivity: 2.8 mV
MDC IDC MSMT BATTERY VOLTAGE: 2.78 V
MDC IDC MSMT LEADCHNL RA IMPEDANCE VALUE: 0 Ohm
MDC IDC SESS DTM: 20160823183100
MDC IDC SET LEADCHNL RV PACING AMPLITUDE: 2.5 V

## 2015-05-17 NOTE — Progress Notes (Signed)
Pacemaker check in clinic. Normal device function. Threshold, sensing, and impedance consistent with previous measurements. Device programmed to maximize longevity. Permanent AF + Warfarin. No high ventricular rates noted. Device programmed at appropriate safety margins. Histogram distribution appropriate for patient activity level. Device programmed to optimize intrinsic conduction. Estimated longevity 9 years. Patient will follow up with [facility] in 3 months.

## 2015-05-23 ENCOUNTER — Encounter: Payer: Self-pay | Admitting: *Deleted

## 2015-05-31 ENCOUNTER — Ambulatory Visit (INDEPENDENT_AMBULATORY_CARE_PROVIDER_SITE_OTHER): Payer: Medicare Other | Admitting: Pharmacist Clinician (PhC)/ Clinical Pharmacy Specialist

## 2015-05-31 DIAGNOSIS — Z952 Presence of prosthetic heart valve: Secondary | ICD-10-CM

## 2015-05-31 DIAGNOSIS — Z954 Presence of other heart-valve replacement: Secondary | ICD-10-CM | POA: Diagnosis not present

## 2015-05-31 DIAGNOSIS — Z7901 Long term (current) use of anticoagulants: Secondary | ICD-10-CM | POA: Diagnosis not present

## 2015-05-31 DIAGNOSIS — I482 Chronic atrial fibrillation, unspecified: Secondary | ICD-10-CM

## 2015-05-31 LAB — POCT INR: INR: 2.1

## 2015-06-10 ENCOUNTER — Other Ambulatory Visit: Payer: Self-pay | Admitting: Cardiovascular Disease

## 2015-06-10 NOTE — Telephone Encounter (Signed)
Rx request sent to pharmacy.  

## 2015-06-24 ENCOUNTER — Encounter: Payer: Self-pay | Admitting: Cardiovascular Disease

## 2015-06-28 ENCOUNTER — Ambulatory Visit: Payer: Medicare Other | Admitting: Pharmacist Clinician (PhC)/ Clinical Pharmacy Specialist

## 2015-07-01 ENCOUNTER — Ambulatory Visit (INDEPENDENT_AMBULATORY_CARE_PROVIDER_SITE_OTHER): Payer: Medicare Other | Admitting: Pharmacist Clinician (PhC)/ Clinical Pharmacy Specialist

## 2015-07-01 DIAGNOSIS — I482 Chronic atrial fibrillation, unspecified: Secondary | ICD-10-CM

## 2015-07-01 DIAGNOSIS — Z7901 Long term (current) use of anticoagulants: Secondary | ICD-10-CM

## 2015-07-01 DIAGNOSIS — Z954 Presence of other heart-valve replacement: Secondary | ICD-10-CM

## 2015-07-01 DIAGNOSIS — Z952 Presence of prosthetic heart valve: Secondary | ICD-10-CM

## 2015-07-01 LAB — POCT INR: INR: 1.9

## 2015-07-10 DIAGNOSIS — R42 Dizziness and giddiness: Secondary | ICD-10-CM | POA: Diagnosis not present

## 2015-07-10 DIAGNOSIS — I10 Essential (primary) hypertension: Secondary | ICD-10-CM | POA: Diagnosis not present

## 2015-07-29 ENCOUNTER — Ambulatory Visit (INDEPENDENT_AMBULATORY_CARE_PROVIDER_SITE_OTHER): Payer: Medicare Other | Admitting: Pharmacist Clinician (PhC)/ Clinical Pharmacy Specialist

## 2015-07-29 DIAGNOSIS — Z952 Presence of prosthetic heart valve: Secondary | ICD-10-CM

## 2015-07-29 DIAGNOSIS — Z7901 Long term (current) use of anticoagulants: Secondary | ICD-10-CM | POA: Diagnosis not present

## 2015-07-29 DIAGNOSIS — I482 Chronic atrial fibrillation, unspecified: Secondary | ICD-10-CM

## 2015-07-29 DIAGNOSIS — Z954 Presence of other heart-valve replacement: Secondary | ICD-10-CM | POA: Diagnosis not present

## 2015-07-29 LAB — POCT INR: INR: 2

## 2015-08-01 ENCOUNTER — Other Ambulatory Visit: Payer: Self-pay | Admitting: Cardiovascular Disease

## 2015-08-02 ENCOUNTER — Other Ambulatory Visit: Payer: Self-pay | Admitting: *Deleted

## 2015-08-02 MED ORDER — METOPROLOL TARTRATE 50 MG PO TABS: 50.0000 mg | ORAL_TABLET | Freq: Two times a day (BID) | ORAL | Status: DC

## 2015-08-09 DIAGNOSIS — Z1231 Encounter for screening mammogram for malignant neoplasm of breast: Secondary | ICD-10-CM | POA: Diagnosis not present

## 2015-08-12 ENCOUNTER — Ambulatory Visit (INDEPENDENT_AMBULATORY_CARE_PROVIDER_SITE_OTHER): Payer: Medicare Other | Admitting: Cardiovascular Disease

## 2015-08-12 ENCOUNTER — Encounter: Payer: Self-pay | Admitting: Cardiovascular Disease

## 2015-08-12 VITALS — BP 142/80 | HR 88 | Resp 18 | Ht 61.0 in | Wt 152.0 lb

## 2015-08-12 DIAGNOSIS — I4891 Unspecified atrial fibrillation: Secondary | ICD-10-CM | POA: Diagnosis not present

## 2015-08-12 DIAGNOSIS — I495 Sick sinus syndrome: Secondary | ICD-10-CM

## 2015-08-12 DIAGNOSIS — Z952 Presence of prosthetic heart valve: Secondary | ICD-10-CM

## 2015-08-12 DIAGNOSIS — I38 Endocarditis, valve unspecified: Secondary | ICD-10-CM

## 2015-08-12 DIAGNOSIS — R0602 Shortness of breath: Secondary | ICD-10-CM

## 2015-08-12 DIAGNOSIS — Z954 Presence of other heart-valve replacement: Secondary | ICD-10-CM

## 2015-08-12 DIAGNOSIS — I5033 Acute on chronic diastolic (congestive) heart failure: Secondary | ICD-10-CM

## 2015-08-12 DIAGNOSIS — I10 Essential (primary) hypertension: Secondary | ICD-10-CM

## 2015-08-12 DIAGNOSIS — I482 Chronic atrial fibrillation, unspecified: Secondary | ICD-10-CM

## 2015-08-12 DIAGNOSIS — Z7901 Long term (current) use of anticoagulants: Secondary | ICD-10-CM

## 2015-08-12 DIAGNOSIS — Z950 Presence of cardiac pacemaker: Secondary | ICD-10-CM

## 2015-08-12 DIAGNOSIS — I340 Nonrheumatic mitral (valve) insufficiency: Secondary | ICD-10-CM | POA: Diagnosis not present

## 2015-08-12 DIAGNOSIS — T826XXS Infection and inflammatory reaction due to cardiac valve prosthesis, sequela: Secondary | ICD-10-CM

## 2015-08-12 NOTE — Progress Notes (Signed)
Patient ID: Carrie [name], female   DOB: 19-Jun-1941, 74 y.o.   MRN: [id number]     Cardiology Office Note   Date:  08/14/2015   ID:  Carrie [name], DOB 12-Sep-1941, MRN [id number]  PCP:  Elyn Peers, MD  Cardiologist:   [name], MD   Chief Complaint  Patient presents with  . PHYS PACER CHECK  . Shortness of Breath  . Dizziness  . Edema    FEET      History of Present Illness: Carrie [name] is a 74 y.o. female who presents for valvular heart disease, atrial fibrillation with slow ventricular response, pacemaker check, chronic heart failure.  She has had steadily worsening fatigue and exertional dyspnea. She has NYHA functional class II. The lack of energy is the most dominant complaint. She has had relatively mild ankle edema. She denies orthopnea or PND, cough, hemoptysis, chest discomfort, focal neurological events or new bleeding problems.  Interrogation of her pacemaker shows normal device function. Her initial pacemaker was implanted in 2008 and the current generator was implanted in 2015 (single chamber Medtronic [place]), for atrial fibrillation with slow ventricular response. Estimated current generator longevity is 8 years. No episodes of high ventricular rate are recorded, there is 80% ventricular pacing.  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 Jude in 1998). She has a history of serious bleeding complications on warfarin. In December 2012 she had subarachnoid hemorrhage and in June 2013 she had intrahepatic hemorrhage requiring arterial embolization and we are keeping her INR in the lower range (2.0-2.5). She also has permanent atrial fibrillation with satisfactory rate control and a single-chamber permanent pacemaker (Medtronic [place], implant initially 2008, pacemaker generator change out 2015) for episodes of bradycardia/slow ventricular response. The prevalence of ventricular pacing is around  81%, similar to her historical trend. Despite the high percentage of ventricular pacing she has still had episodes of ventricular high rate recorded all of which probably represent atrial fibrillation with rapid ventricular response so she is on both calcium channel blockers and metoprolol. She has normal coronary arteries by previous angiography (2004) normal left ventricular systolic function by echocardiography (Oct 2015).   Past Medical History  Diagnosis Date  . Hypertension   . Atrial fibrillation ([place])     normal coronaries cath 2004. Dr Rex Kras [facility]   . Sick sinus syndrome ([facility])     Dr Crissie Sickles. EP study negative. Pacemaker 12/15/06 Medtronic  . Hyperlipidemia   . Anxiety   . Diverticula of colon   . Hemorrhage intraabdominal 03/17/2012  . 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 Jude mechanical prosthesis. LA severe dilated  . Subdural hematoma ([place])   . Liver hemorrhage   . Atrial flutter ([place])   . Pacemaker     medtronic adapta  . Heart murmur   . CHF (congestive heart failure) ([place])   . Pneumonia 2009    resolved.? OPD Rx  . Exertional shortness of breath   . History of blood transfusion     "once" (03/15/2013)  . GERD (gastroesophageal reflux disease)   . Migraines   . Stroke [facility])     "they say I had a stroke last year" denies residual on 03/15/2013  . Arthritis     "right shoulder" (03/15/2013)  . Anticoagulated on Coumadin     for mech valve and atrial fib  goal 2.0-2.5    Past Surgical History  Procedure Laterality  Date  . Mitral valve replacement  1998    St Jude mechanical; Dr. [name]  . Appendectomy    . Tubal ligation    . Tee without cardioversion  09/23/2011    Procedure: TRANSESOPHAGEAL ECHOCARDIOGRAM (TEE);  Surgeon: [name];  Location: [facility] ENDOSCOPY;  Service: Cardiovascular;  Laterality: N/A;  . Insert / replace / remove pacemaker  12/15/2006    Medtronic  . US echocardiography  11/19/2011    EF 50-55%,RA mod to  severely dilated,LA severely dilated,trace MR,small vegetation or mass on the MV,AOV mildly scleroticmild PI, RV pressure 40-72mmHg  . Persantine cardiolite  08/07/03    mild inf. ischemia   . Tonsillectomy    . Exploratory laparotomy      "had a growth on my intestines" (03/15/2013)  . Cataract extraction w/ intraocular lens  implant, bilateral Bilateral 2013  . Dilation and curettage of uterus      "had fibroids" (03/15/2013)  . Cardiac catheterization  04/02/97    R&L:severe MR/pulmonary hypertension  . Hammer toe surgery Right   . Pacemaker placement  12/15/06    medtronic adapta for SSS  . Permanent pacemaker generator change N/A 07/24/2014    Procedure: PERMANENT PACEMAKER GENERATOR CHANGE;  Surgeon: [name], MD;  Location: [facility] CATH LAB;  Service: Cardiovascular;  Laterality: N/A;     Current Outpatient Prescriptions  Medication Sig Dispense Refill  . Biotin 1000 MCG tablet Take 1,000 mcg by mouth daily.    . calcium carbonate (OS-CAL) 600 MG TABS tablet Take 600 mg by mouth daily.    . chlorthalidone (HYGROTON) 25 MG tablet Take 0.5 tablets (12.5 mg total) by mouth daily. 15 tablet 3  . diltiazem (CARTIA XT) 120 MG 24 hr capsule Take 1 capsule (120 mg total) by mouth daily. 90 capsule 3  . folic acid (FOLVITE) A999333 MCG tablet Take 400 mcg by mouth every other day.    . metoprolol (LOPRESSOR) 50 MG tablet Take 1 tablet (50 mg total) by mouth 2 (two) times daily. 60 tablet 0  . Multiple Vitamin (MULTIVITAMINS PO) Take 1 tablet by mouth daily.     . Omega-3 Fatty Acids (FISH OIL) 1000 MG CAPS Take 1,000 mg by mouth daily.    Marland Kitchen OVER THE COUNTER MEDICATION Place 1 drop into both eyes 2 (two) times daily. Drops for dry eyes    . polyethylene glycol (MIRALAX / GLYCOLAX) packet Take 17 g by mouth as needed (for constipation).     . warfarin (COUMADIN) 5 MG tablet Take 1 tablet (5 mg total) by mouth daily. as directed 90 tablet 1   No current facility-administered medications for this  visit.    Allergies:   Codeine    Social History:  The patient  reports that she has never smoked. She has never used smokeless tobacco. She reports that she does not drink alcohol or use illicit drugs.   Family History:  The patient's family history includes Cancer in her mother and sister; Diabetes in her sister; Healthy in her brother, brother, brother, and sister; Leukemia in her sister; Stroke in her father.    ROS:  Please see the history of present illness.    Otherwise, review of systems positive for none.   All other systems are reviewed and negative.    PHYSICAL EXAM: VS:  BP 142/80 mmHg  Pulse 88  Ht 5\' 1"  (1.549 m)  Wt 152 lb (68.947 kg)  BMI 28.74 kg/m2 , BMI Body mass index is 28.74 kg/(m^2).  General: Alert, oriented x3, no distress Head: no evidence of trauma, PERRL, EOMI, no exophtalmos or lid lag, no myxedema, no xanthelasma; normal ears, nose and oropharynx Neck: Markedly elevated jugular venous pulsations, very prominent V waves  reach the angle of the jaw and prompt hepatojugular reflux; brisk carotid pulses without delay and no carotid bruits Chest: clear to auscultation, no signs of consolidation by percussion or palpation, normal fremitus, symmetrical and full respiratory excursions Cardiovascular: normal position and quality of the apical impulse, regular rhythm, crisp prosthetic valve clicks normal first and second heart sounds, 3/6 holosystolic murmur at the left lower sternal border radiating up towards the aortic focus, no diastolic murmurs, rubs or gallops Abdomen: no tenderness or distention, no masses by palpation, no abnormal pulsatility or arterial bruits, normal bowel sounds, no hepatosplenomegaly Extremities: no clubbing, cyanosis or edema; 2+ radial, ulnar and brachial pulses bilaterally; 2+ right femoral, posterior tibial and dorsalis pedis pulses; 2+ left femoral, posterior tibial and dorsalis pedis pulses; no subclavian or femoral  bruits Neurological: grossly nonfocal Psych: euthymic mood, full affect   EKG:  EKG is ordered today. The ekg ordered today demonstrates atrial fibrillation with intermittent ventricular sensed beat/ventricular paced beats, since these show a right bundle branch block pattern   Recent Labs: 12/03/2014: ALT 24; BUN 18.7; Creatinine 0.9; HGB 14.4; Platelets 118*; Potassium 3.9; Sodium 139    Lipid Panel No results found for: CHOL, TRIG, HDL, CHOLHDL, VLDL, LDLCALC, LDLDIRECT    Wt Readings from Last 3 Encounters:  08/12/15 152 lb (68.947 kg)  12/03/14 151 lb 6.4 oz (68.675 kg)  11/13/14 152 lb 3.2 oz (69.037 kg)     ASSESSMENT AND PLAN:  1. Atrial fibrillation with slow ventricular response and increased frequency of ventricular pacing. We have not seen recent high ventricular rates. It is possible that the high prevalence of ventricular pacing may be contributing to heart failure. Will review echocardiogram and decide on adjusting her medications or even referral for cardiac resynchronization therapy if there is overt evidence of left ventricular dysfunction  2. Normally functioning single chamber permanent pacemaker  3. History of subarachnoid hemorrhage intrahepatic hemorrhage, keep INR 2.0-2.5  4. 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 Repeat echocardiogram.  5. Status post mechanical mitral valve prosthesis. Maintaining a relatively slow heart rate is important in the setting of intrinsic stenosis of the prosthesis compared with the native valve, on the other hand increased burden of ventricular pacing may be deleterious for left ventricular systolic function. Not sure where the perfect balance lies.  6. Acute on chronic diastolic heart failure. Her weight is not changed from earlier this year. Will wait for echo results before adjusting diuretics.  7. Essential hypertension, borderline high today. Consider adding ACE inhibitor and switching from  chlorthalidone to furosemide, again depending on echo findings.    Current medicines are reviewed at length with the patient today.  The patient does not have concerns regarding medicines.  The following changes have been made:  no change  Labs/ tests ordered today include:  Orders Placed This Encounter  Procedures  . EKG 12-Lead  . ECHOCARDIOGRAM COMPLETE    Patient Instructions  Your physician has requested that you have an echocardiogram. Echocardiography is a painless test that uses sound waves to create images of your heart. It provides your doctor with information about the size and shape of your heart and how well your heart's chambers and valves are working. This procedure takes approximately one hour. There are no restrictions for  this procedure.  Remote monitoring is used to monitor your Pacemaker from home. This monitoring reduces the number of office visits required to check your device to one time per year. It allows [place] to monitor the functioning of your device to ensure it is working properly. You are scheduled for a device check from home on November 13, 2015 . You may send your transmission at any time that day. If you have a wireless device, the transmission will be sent automatically. After your physician reviews your transmission, you will receive a postcard with your next transmission date.  Dr. Sallyanne Kuster recommends that you schedule a follow-up appointment in: 6 months     Signed, [name], MD  08/14/2015 10:37 AM    [name], MD, [facility] [phone] office [phone] pager

## 2015-08-12 NOTE — Patient Instructions (Signed)
Your physician has requested that you have an echocardiogram. Echocardiography is a painless test that uses sound waves to create images of your heart. It provides your doctor with information about the size and shape of your heart and how well your heart's chambers and valves are working. This procedure takes approximately one hour. There are no restrictions for this procedure.  Remote monitoring is used to monitor your Pacemaker from home. This monitoring reduces the number of office visits required to check your device to one time per year. It allows [place] to monitor the functioning of your device to ensure it is working properly. You are scheduled for a device check from home on November 13, 2015 . You may send your transmission at any time that day. If you have a wireless device, the transmission will be sent automatically. After your physician reviews your transmission, you will receive a postcard with your next transmission date.  Dr. Sallyanne Kuster recommends that you schedule a follow-up appointment in: 6 months

## 2015-08-14 ENCOUNTER — Encounter: Payer: Self-pay | Admitting: Cardiovascular Disease

## 2015-08-19 ENCOUNTER — Other Ambulatory Visit: Payer: Self-pay | Admitting: Cardiovascular Disease

## 2015-08-20 LAB — CUP PACEART INCLINIC DEVICE CHECK
Battery Impedance: 234 Ohm
Battery Remaining Longevity: 95 mo
Battery Voltage: 2.78 V
Brady Statistic RV Percent Paced: 81 %
Date Time Interrogation Session: 20161121185420
Implantable Lead Implant Date: 20080326
Implantable Lead Location: 753860
Implantable Lead Model: 4076
Lead Channel Setting Pacing Pulse Width: 0.4 ms
Lead Channel Setting Sensing Sensitivity: 4 mV
MDC IDC MSMT LEADCHNL RA IMPEDANCE VALUE: 0 Ohm
MDC IDC MSMT LEADCHNL RV IMPEDANCE VALUE: 424 Ohm
MDC IDC MSMT LEADCHNL RV PACING THRESHOLD AMPLITUDE: 0.875 V
MDC IDC MSMT LEADCHNL RV PACING THRESHOLD PULSEWIDTH: 0.4 ms
MDC IDC SET LEADCHNL RV PACING AMPLITUDE: 2.5 V

## 2015-08-22 ENCOUNTER — Other Ambulatory Visit: Payer: Self-pay

## 2015-08-22 ENCOUNTER — Ambulatory Visit (HOSPITAL_COMMUNITY): Payer: Medicare Other | Attending: Internal Medicine

## 2015-08-22 DIAGNOSIS — I059 Rheumatic mitral valve disease, unspecified: Secondary | ICD-10-CM | POA: Diagnosis present

## 2015-08-22 DIAGNOSIS — I351 Nonrheumatic aortic (valve) insufficiency: Secondary | ICD-10-CM | POA: Insufficient documentation

## 2015-08-22 DIAGNOSIS — I10 Essential (primary) hypertension: Secondary | ICD-10-CM | POA: Diagnosis not present

## 2015-08-22 DIAGNOSIS — I071 Rheumatic tricuspid insufficiency: Secondary | ICD-10-CM | POA: Insufficient documentation

## 2015-08-22 DIAGNOSIS — Z954 Presence of other heart-valve replacement: Secondary | ICD-10-CM | POA: Diagnosis not present

## 2015-08-22 DIAGNOSIS — R0602 Shortness of breath: Secondary | ICD-10-CM | POA: Diagnosis not present

## 2015-08-22 DIAGNOSIS — I340 Nonrheumatic mitral (valve) insufficiency: Secondary | ICD-10-CM | POA: Diagnosis not present

## 2015-08-22 DIAGNOSIS — I358 Other nonrheumatic aortic valve disorders: Secondary | ICD-10-CM | POA: Insufficient documentation

## 2015-08-22 DIAGNOSIS — I371 Nonrheumatic pulmonary valve insufficiency: Secondary | ICD-10-CM | POA: Diagnosis not present

## 2015-08-22 DIAGNOSIS — D696 Thrombocytopenia, unspecified: Secondary | ICD-10-CM | POA: Insufficient documentation

## 2015-08-22 DIAGNOSIS — Z952 Presence of prosthetic heart valve: Secondary | ICD-10-CM

## 2015-08-22 DIAGNOSIS — I495 Sick sinus syndrome: Secondary | ICD-10-CM | POA: Insufficient documentation

## 2015-08-26 ENCOUNTER — Ambulatory Visit (INDEPENDENT_AMBULATORY_CARE_PROVIDER_SITE_OTHER): Payer: Medicare Other | Admitting: Pharmacist Clinician (PhC)/ Clinical Pharmacy Specialist

## 2015-08-26 DIAGNOSIS — I482 Chronic atrial fibrillation, unspecified: Secondary | ICD-10-CM

## 2015-08-26 DIAGNOSIS — Z7901 Long term (current) use of anticoagulants: Secondary | ICD-10-CM

## 2015-08-26 DIAGNOSIS — Z952 Presence of prosthetic heart valve: Secondary | ICD-10-CM

## 2015-08-26 DIAGNOSIS — Z954 Presence of other heart-valve replacement: Secondary | ICD-10-CM | POA: Diagnosis not present

## 2015-08-26 LAB — POCT INR: INR: 1.9

## 2015-08-26 NOTE — Patient Instructions (Signed)

## 2015-08-27 ENCOUNTER — Other Ambulatory Visit: Payer: Self-pay | Admitting: Cardiovascular Disease

## 2015-09-02 ENCOUNTER — Other Ambulatory Visit: Payer: Self-pay | Admitting: Cardiovascular Disease

## 2015-09-02 MED ORDER — METOPROLOL TARTRATE 50 MG PO TABS: 50.0000 mg | ORAL_TABLET | Freq: Two times a day (BID) | ORAL | Status: DC

## 2015-09-02 NOTE — Telephone Encounter (Signed)
°*  STAT* If patient is at the pharmacy, call can be transferred to refill team.   1. Which medications need to be refilled? (please list name of each medication and dose if known) Metoprolol-pt had appt in November  2. Which pharmacy/location (including street and city if local pharmacy) is medication to be sent to?[facility]-[phone]  3. Do they need a 30 day or 90 day supply? 60 and refills

## 2015-09-03 ENCOUNTER — Encounter: Payer: Self-pay | Admitting: Cardiovascular Disease

## 2015-09-25 ENCOUNTER — Ambulatory Visit (INDEPENDENT_AMBULATORY_CARE_PROVIDER_SITE_OTHER): Payer: Medicare Other | Admitting: Pharmacist Clinician (PhC)/ Clinical Pharmacy Specialist

## 2015-09-25 DIAGNOSIS — Z7901 Long term (current) use of anticoagulants: Secondary | ICD-10-CM

## 2015-09-25 DIAGNOSIS — Z952 Presence of prosthetic heart valve: Secondary | ICD-10-CM

## 2015-09-25 DIAGNOSIS — I482 Chronic atrial fibrillation, unspecified: Secondary | ICD-10-CM

## 2015-09-25 DIAGNOSIS — Z954 Presence of other heart-valve replacement: Secondary | ICD-10-CM

## 2015-09-25 LAB — POCT INR: INR: 1.9

## 2015-09-26 ENCOUNTER — Other Ambulatory Visit: Payer: Self-pay | Admitting: Cardiovascular Disease

## 2015-10-18 ENCOUNTER — Ambulatory Visit (INDEPENDENT_AMBULATORY_CARE_PROVIDER_SITE_OTHER): Payer: Medicare Other | Admitting: Pharmacist Clinician (PhC)/ Clinical Pharmacy Specialist

## 2015-10-18 ENCOUNTER — Encounter: Payer: Medicare Other | Admitting: Pharmacist Clinician (PhC)/ Clinical Pharmacy Specialist

## 2015-10-18 DIAGNOSIS — I482 Chronic atrial fibrillation, unspecified: Secondary | ICD-10-CM

## 2015-10-18 DIAGNOSIS — Z7901 Long term (current) use of anticoagulants: Secondary | ICD-10-CM | POA: Diagnosis not present

## 2015-10-18 DIAGNOSIS — Z954 Presence of other heart-valve replacement: Secondary | ICD-10-CM | POA: Diagnosis not present

## 2015-10-18 DIAGNOSIS — Z952 Presence of prosthetic heart valve: Secondary | ICD-10-CM

## 2015-10-18 LAB — POCT INR: INR: 2.1

## 2015-11-07 ENCOUNTER — Telehealth: Payer: Self-pay | Admitting: Cardiovascular Disease

## 2015-11-07 DIAGNOSIS — Z0000 Encounter for general adult medical examination without abnormal findings: Secondary | ICD-10-CM | POA: Diagnosis not present

## 2015-11-07 DIAGNOSIS — I110 Hypertensive heart disease with heart failure: Secondary | ICD-10-CM | POA: Diagnosis not present

## 2015-11-07 DIAGNOSIS — I10 Essential (primary) hypertension: Secondary | ICD-10-CM | POA: Diagnosis not present

## 2015-11-07 NOTE — Telephone Encounter (Signed)
New message    Patient had echo done 12.1.2016    1. Need copy of report fax over  2. patient still C/O pain swelling, edema in lower extremities  Pt C/O swelling: STAT is pt has developed SOB within 24 hours  1. How long have you been experiencing swelling? Office uninsured   2. Where is the swelling located? Both legs  3.  Are you currently taking a "fluid pill"? No   4.  Are you currently SOB? No   5.  Have you traveled recently? No

## 2015-11-07 NOTE — Telephone Encounter (Signed)
Pt Echo faxed to Dr. Fransico Setters office and requested per Tamika Dr. Fransico Setters Nurse. She is worried pt going into Heart Failure with increased symptoms.  Reviewed pt echo results and pt will need appt with Dr. Loletha Grayer or extnder to be evaluated Pt schedueld with Dr. Loletha Grayer on 2/22 @ 4:15p. Pt aware of appt no questions at this time.

## 2015-11-11 DIAGNOSIS — I110 Hypertensive heart disease with heart failure: Secondary | ICD-10-CM | POA: Diagnosis not present

## 2015-11-13 ENCOUNTER — Ambulatory Visit (INDEPENDENT_AMBULATORY_CARE_PROVIDER_SITE_OTHER): Payer: Medicare Other | Admitting: Cardiovascular Disease

## 2015-11-13 ENCOUNTER — Telehealth: Payer: Self-pay | Admitting: Cardiology

## 2015-11-13 ENCOUNTER — Ambulatory Visit (INDEPENDENT_AMBULATORY_CARE_PROVIDER_SITE_OTHER): Payer: Medicare Other | Admitting: *Deleted

## 2015-11-13 ENCOUNTER — Ambulatory Visit (INDEPENDENT_AMBULATORY_CARE_PROVIDER_SITE_OTHER): Payer: Medicare Other | Admitting: Pharmacist Clinician (PhC)/ Clinical Pharmacy Specialist

## 2015-11-13 ENCOUNTER — Encounter: Payer: Self-pay | Admitting: Cardiovascular Disease

## 2015-11-13 VITALS — BP 138/70 | HR 84 | Ht 61.0 in | Wt 150.0 lb

## 2015-11-13 DIAGNOSIS — Z7901 Long term (current) use of anticoagulants: Secondary | ICD-10-CM | POA: Diagnosis not present

## 2015-11-13 DIAGNOSIS — I5033 Acute on chronic diastolic (congestive) heart failure: Secondary | ICD-10-CM

## 2015-11-13 DIAGNOSIS — I495 Sick sinus syndrome: Secondary | ICD-10-CM

## 2015-11-13 DIAGNOSIS — I10 Essential (primary) hypertension: Secondary | ICD-10-CM

## 2015-11-13 DIAGNOSIS — I482 Chronic atrial fibrillation, unspecified: Secondary | ICD-10-CM

## 2015-11-13 DIAGNOSIS — Z950 Presence of cardiac pacemaker: Secondary | ICD-10-CM

## 2015-11-13 DIAGNOSIS — Z954 Presence of other heart-valve replacement: Secondary | ICD-10-CM

## 2015-11-13 DIAGNOSIS — Z952 Presence of prosthetic heart valve: Secondary | ICD-10-CM

## 2015-11-13 DIAGNOSIS — I071 Rheumatic tricuspid insufficiency: Secondary | ICD-10-CM

## 2015-11-13 LAB — POCT INR: INR: 2.2

## 2015-11-13 NOTE — Telephone Encounter (Signed)
Spoke with pt and reminded pt of remote transmission that is due today. Pt verbalized understanding.   

## 2015-11-13 NOTE — Patient Instructions (Addendum)
WEIGH YOURSELF TODAY WHEN YOU GET HOME.  SUBTRACT 6 LBS AND THIS WILL BE YOUR "TARGET" WEIGHT.  WHEN YOUR WEIGHT IS GREATER THAN YOUR "TARGET" WEIGHT TAKE FUROSEMIDE AND POTASSIUM DAILY.  DECREASE METOPROLOL TO 25 MG TWICE A DAY.  NEW RX WILL BE THE 25 MG TABLETS.  TAKE YOUR CURRENT RX OF 50 MG TABLETS 1/2 TWICE A DAY.   Your physician recommends that you schedule a follow-up appointment in: 2 WEEKS WITH AN EXTENDER   Dr. Sallyanne Kuster recommends that you schedule a follow-up appointment in: 3 MONTHS WITH A PACEMAKER CHECK   Daily Weight Record It is important to weigh yourself daily. Keep this daily weight chart near your scale. Weigh yourself each morning at the same time. Weigh yourself without shoes, and wear the same amount of clothing each day. Compare today's weight to yesterday's weight. Bring this form with you to your follow-up appointments. Call your health care provider if you have concerns about your weight, including rapid weight gain or rapid weight loss. Date: ________ Weight: ____________________ Date: ________ Weight: ____________________ Date: ________ Weight: ____________________ Date: ________ Weight: ____________________ Date: ________ Weight: ____________________ Date: ________ Weight: ____________________ Date: ________ Weight: ____________________ Date: ________ Weight: ____________________ Date: ________ Weight: ____________________ Date: ________ Weight: ____________________ Date: ________ Weight: ____________________ Date: ________ Weight: ____________________ Date: ________ Weight: ____________________ Date: ________ Weight: ____________________ Date: ________ Weight: ____________________ Date: ________ Weight: ____________________ Date: ________ Weight: ____________________ Date: ________ Weight: ____________________ Date: ________ Weight: ____________________ Date: ________ Weight: ____________________ Date: ________ Weight: ____________________ Date: ________ Weight:  ____________________ Date: ________ Weight: ____________________ Date: ________ Weight: ____________________ Date: ________ Weight: ____________________ Date: ________ Weight: ____________________ Date: ________ Weight: ____________________ Date: ________ Weight: ____________________ Date: ________ Weight: ____________________ Date: ________ Weight: ____________________ Date: ________ Weight: ____________________ Date: ________ Weight: ____________________ Date: ________ Weight: ____________________ Date: ________ Weight: ____________________ Date: ________ Weight: ____________________ Date: ________ Weight: ____________________ Date: ________ Weight: ____________________ Date: ________ Weight: ____________________ Date: ________ Weight: ____________________ Date: ________ Weight: ____________________ Date: ________ Weight: ____________________ Date: ________ Weight: ____________________ Date: ________ Weight: ____________________ Date: ________ Weight: ____________________ Date: ________ Weight: ____________________ Date: ________ Weight: ____________________ Date: ________ Weight: ____________________ Date: ________ Weight: ____________________ Date: ________ Weight: ____________________ Date: ________ Weight: ____________________   This information is not intended to replace advice given to you by your health care provider. Make sure you discuss any questions you have with your health care provider.   Document Released: 11/19/2006 Document Revised: 09/28/2014 Document Reviewed: 04/06/2014 Elsevier Interactive Patient Education [employer].

## 2015-11-13 NOTE — Progress Notes (Signed)
Patient ID: OLISHA SWIATEK, female   DOB: 1941/01/23, 75 y.o.   MRN: [id number]    Cardiology Office Note    Date:  11/15/2015   ID:  HANIA YOUNGKIN, DOB 1940/12/26, MRN [id number]  PCP:  Elyn Peers, MD  Cardiologist:   [name], MD   Chief Complaint  Patient presents with  . Follow-up  . Leg Swelling  . Shortness of Breath    History of Present Illness:  CARRINE ENDRESS is a 75 y.o. 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  In December 2012 she had subarachnoid hemorrhage and in June 2013 she had intrahepatic hemorrhage requiring arterial embolization and we are keeping her INR in the lower range (2.0-2.5). She has normal coronary arteries by previous angiography (2004) normal left ventricular systolic function by echocardiography (Oct 2015).  She has problems with worse than usual lower extremity edema. She has dyspnea on exertion climbing 1 flight of stairs.  Interrogation of her Medtronic since see a single chamber pacemaker shows normal device function with an estimated generator longevity of 6.5-9 years, good histograms in the absence of episodes of high ventricular rate. She has a high percentage of ventricular pacing of 83% (at lower rate limit of 60 bpm).  Past Medical History  Diagnosis Date  . Hypertension   . Atrial fibrillation ([place])     normal coronaries cath 2004. Dr Rex Kras [facility]   . Sick sinus syndrome (H[facility])     Dr Crissie Sickles. EP study negative. Pacemaker 12/15/06 Medtronic  . Hyperlipidemia   . Anxiety   . Diverticula of colon   . Hemorrhage intraabdominal 03/17/2012  . Mitral valve regurgitation, rheumatic 11/19/11   Bi-leaflet St. Jude mechanical prosthesis. LA severe dilated  . Subdural hematoma ([place])   . Liver hemorrhage   . Atrial flutter ([place])   . Pacemaker     medtronic adapta  . Heart murmur   . CHF (congestive heart failure) ([place])   . Pneumonia 2009    resolved.? OPD Rx  . Exertional shortness of breath   . History of blood transfusion     "once" (03/15/2013)  . GERD (gastroesophageal reflux disease)   . Migraines   . Stroke [facility])     "they say I had a stroke last year" denies residual on 03/15/2013  . Arthritis     "right shoulder" (03/15/2013)  . Anticoagulated on Coumadin     for mech valve and atrial fib  goal 2.0-2.5    Past Surgical History  Procedure Laterality Date  . Mitral valve replacement  1998    St Jude mechanical; Dr. [name]  . Appendectomy    . Tubal ligation    . Tee without cardioversion  09/23/2011    Procedure: TRANSESOPHAGEAL ECHOCARDIOGRAM (TEE);  Surgeon: [name];  Location: [facility] ENDOSCOPY;  Service: Cardiovascular;  Laterality: N/A;  . Insert / replace / remove pacemaker  12/15/2006    Medtronic  . US echocardiography  11/19/2011    EF 50-55%,RA mod to severely dilated,LA severely dilated,trace MR,small vegetation or mass on the MV,AOV mildly scleroticmild PI, RV pressure 40-73mmHg  . Persantine cardiolite  08/07/03    mild inf. ischemia   . Tonsillectomy    . Exploratory laparotomy      "  had a growth on my intestines" (03/15/2013)  . Cataract extraction w/ intraocular lens  implant, bilateral Bilateral 2013  . Dilation and curettage of uterus      "had fibroids" (03/15/2013)  . Cardiac catheterization  04/02/97    R&L:severe MR/pulmonary hypertension  . Hammer toe surgery Right   . Pacemaker placement  12/15/06    medtronic adapta for SSS  . Permanent pacemaker generator change N/A 07/24/2014    Procedure: PERMANENT PACEMAKER GENERATOR CHANGE;  Surgeon: [name], MD;  Location: [place] CATH LAB;  Service: Cardiovascular;  Laterality: N/A;     Outpatient Prescriptions Prior to Visit  Medication Sig Dispense Refill  . Biotin 1000 MCG tablet Take 1,000 mcg by mouth daily.    . calcium carbonate (OS-CAL) 600 MG TABS tablet Take 600 mg by mouth daily.    . chlorthalidone (HYGROTON) 25 MG tablet Take 0.5 tablets (12.5 mg total) by mouth daily. 15 tablet 3  . diltiazem (CARTIA XT) 120 MG 24 hr capsule Take 1 capsule (120 mg total) by mouth daily. 90 capsule 3  . folic acid (FOLVITE) A999333 MCG tablet Take 400 mcg by mouth every other day.    . Multiple Vitamin (MULTIVITAMINS PO) Take 1 tablet by mouth daily.     . Omega-3 Fatty Acids (FISH OIL) 1000 MG CAPS Take 1,000 mg by mouth daily.    Marland Kitchen OVER THE COUNTER MEDICATION Place 1 drop into both eyes 2 (two) times daily. Drops for dry eyes    . polyethylene glycol (MIRALAX / GLYCOLAX) packet Take 17 g by mouth as needed (for constipation).     . warfarin (COUMADIN) 5 MG tablet Take 1 tablet (5 mg total) by mouth daily. as directed 90 tablet 1  . warfarin (COUMADIN) 5 MG tablet TAKE 1 TABLET BY MOUTH DAILY AS DIRECTED 90 tablet 2  . metoprolol (LOPRESSOR) 50 MG tablet Take 1 tablet (50 mg total) by mouth 2 (two) times daily. 60 tablet 9   No facility-administered medications prior to visit.     Allergies:   Codeine   Social History   Social History  . Marital Status: Married    Spouse Name: N/A  . Number of Children: N/A  . Years of Education: N/A   Social History Main Topics  . Smoking status: Never Smoker   . Smokeless tobacco: Never Used  . Alcohol Use: No  . Drug Use: No  . Sexual Activity: No   Other Topics Concern  . None   Social History Narrative     Family History:  The patient's family history includes Cancer in her mother and sister; Diabetes in her sister; Healthy in her brother, brother, brother, and sister; Leukemia in her sister; Stroke in her father.   ROS:   Please see the history of present illness.    ROS All other systems reviewed and are  negative.   PHYSICAL EXAM:   VS:  BP 138/70 mmHg  Pulse 84  Ht 5\' 1"  (1.549 m)  Wt 68.04 kg (150 lb)  BMI 28.36 kg/m2   GEN: Well nourished, well developed, in no acute distress HEENT: normal Neck: 8-9 cm elevated JVP, no carotid bruits or masses Cardiac: Irregular, crisp prosthetic valve clicks;  no murmurs, rubs, or gallops, 2-3+ ankle and pretibial edema despite compression stockings  Respiratory:  clear to auscultation bilaterally, normal work of breathing GI: soft, nontender, nondistended, + BS MS: no deformity or atrophy Skin: warm and dry, no rash Neuro:  Alert and Oriented  x 3, Strength and sensation are intact Psych: euthymic mood, full affect  Wt Readings from Last 3 Encounters:  11/13/15 68.04 kg (150 lb)  08/12/15 68.947 kg (152 lb)  12/03/14 68.675 kg (151 lb 6.4 oz)      Studies/Labs Reviewed:   EKG:  EKG is not ordered today.    Recent Labs: 12/03/2014: ALT 24; BUN 18.7; Creatinine 0.9; HGB 14.4; Platelets 118*; Potassium 3.9; Sodium 139    ASSESSMENT:    1. Acute on chronic diastolic congestive heart failure ([place])   2. Chronic atrial fibrillation (HCC)   3. Chronic anticoagulation, ( INR goal 2.0-2.5 due to history of subdural hematoma and liver hemorrhage)   4. Pacemaker - single chamber Medtronic Adapta, 2008   5. S/P mitral valve replacement, St Jude   6. Moderate tricuspid regurgitation   7. Essential hypertension      PLAN:  In order of problems listed above:  1. CHF: Mrs. Bleicher is hypervolemic and has NYHA functional class II-III dyspnea. We'll advise her to increase her dose of diuretics. Reset her "dry weight" target at about 6 pounds less than today. I'm not sure whether the high percentage of ventricular pacing is contributed to the heart failure. We'll decrease her dose of metoprolol. This is of high ventricular rate have been seen since her previous device check. 2. AFib: Rate control may be "too good". Another option may be to decrease  the lower rate limit to 50 bpm to reduce ventricular pacing. 3. Anticoagulation with previous severe bleeding complications, keeping INR in the 2.0-2.5 range 4. PPM: Normal device function, settings of lower rate limit may need to be changed per considerations described above 5. Mechanical mitral valve replacement: She has a large diameter valve that had normal function by a relatively recent echocardiogram. If she does not respond appropriately to change in diuretic dose, will repeat an echocardiogram. 6. Moderate TR is probably contributing to edema in jugular venous distention 7. HTN is well controlled    Medication Adjustments/Labs and Tests Ordered: Current medicines are reviewed at length with the patient today.  Concerns regarding medicines are outlined above.  Medication changes, Labs and Tests ordered today are listed in the Patient Instructions below. Patient Instructions  WEIGH YOURSELF TODAY WHEN YOU GET HOME.  SUBTRACT 6 LBS AND THIS WILL BE YOUR "TARGET" WEIGHT.  WHEN YOUR WEIGHT IS GREATER THAN YOUR "TARGET" WEIGHT TAKE FUROSEMIDE AND POTASSIUM DAILY.  DECREASE METOPROLOL TO 25 MG TWICE A DAY.  NEW RX WILL BE THE 25 MG TABLETS.  TAKE YOUR CURRENT RX OF 50 MG TABLETS 1/2 TWICE A DAY.   Your physician recommends that you schedule a follow-up appointment in: 2 WEEKS WITH AN EXTENDER   Dr. Sallyanne Kuster recommends that you schedule a follow-up appointment in: 3 MONTHS WITH A PACEMAKER CHECK   Daily Weight Record It is important to weigh yourself daily. Keep this daily weight chart near your scale. Weigh yourself each morning at the same time. Weigh yourself without shoes, and wear the same amount of clothing each day. Compare today's weight to yesterday's weight. Bring this form with you to your follow-up appointments. Call your health care provider if you have concerns about your weight, including rapid weight gain or rapid weight loss. Date: ________ Weight: ____________________  Date: ________ Weight: ____________________ Date: ________ Weight: ____________________ Date: ________ Weight: ____________________ Date: ________ Weight: ____________________ Date: ________ Weight: ____________________ Date: ________ Weight: ____________________ Date: ________ Weight: ____________________ Date: ________ Weight: ____________________ Date: ________ Weight: ____________________ Date:  ________ Weight: ____________________ Date: ________ Weight: ____________________ Date: ________ Weight: ____________________ Date: ________ Weight: ____________________ Date: ________ Weight: ____________________ Date: ________ Weight: ____________________ Date: ________ Weight: ____________________ Date: ________ Weight: ____________________ Date: ________ Weight: ____________________ Date: ________ Weight: ____________________ Date: ________ Weight: ____________________ Date: ________ Weight: ____________________ Date: ________ Weight: ____________________ Date: ________ Weight: ____________________ Date: ________ Weight: ____________________ Date: ________ Weight: ____________________ Date: ________ Weight: ____________________ Date: ________ Weight: ____________________ Date: ________ Weight: ____________________ Date: ________ Weight: ____________________ Date: ________ Weight: ____________________ Date: ________ Weight: ____________________ Date: ________ Weight: ____________________ Date: ________ Weight: ____________________ Date: ________ Weight: ____________________ Date: ________ Weight: ____________________ Date: ________ Weight: ____________________ Date: ________ Weight: ____________________ Date: ________ Weight: ____________________ Date: ________ Weight: ____________________ Date: ________ Weight: ____________________ Date: ________ Weight: ____________________ Date: ________ Weight: ____________________ Date: ________ Weight: ____________________ Date: ________ Weight:  ____________________ Date: ________ Weight: ____________________ Date: ________ Weight: ____________________ Date: ________ Weight: ____________________ Date: ________ Weight: ____________________ Date: ________ Weight: ____________________   This information is not intended to replace advice given to you by your health care provider. Make sure you discuss any questions you have with your health care provider.   Document Released: 11/19/2006 Document Revised: 09/28/2014 Document Reviewed: 04/06/2014 Elsevier Interactive Patient Education [address].          Mikael Spray, MD  11/15/2015 4:55 PM    [facility] [address], [place], [place]  [address] Phone: [phone]; Fax: [phone]

## 2015-11-14 NOTE — Progress Notes (Signed)
Remote pacemaker transmission.   

## 2015-11-15 ENCOUNTER — Telehealth: Payer: Self-pay | Admitting: Cardiovascular Disease

## 2015-11-15 DIAGNOSIS — I071 Rheumatic tricuspid insufficiency: Secondary | ICD-10-CM | POA: Insufficient documentation

## 2015-11-15 MED ORDER — METOPROLOL TARTRATE 25 MG PO TABS: 25.0000 mg | ORAL_TABLET | Freq: Two times a day (BID) | ORAL | Status: DC

## 2015-11-15 MED ORDER — POTASSIUM CHLORIDE CRYS ER 20 MEQ PO TBCR: 20.0000 meq | EXTENDED_RELEASE_TABLET | Freq: Every day | ORAL | Status: DC

## 2015-11-15 MED ORDER — FUROSEMIDE 40 MG PO TABS: 40.0000 mg | ORAL_TABLET | Freq: Every day | ORAL | Status: DC

## 2015-11-15 NOTE — Telephone Encounter (Signed)
Med e-prescribed according to MD OV encounter.

## 2015-11-15 NOTE — Telephone Encounter (Signed)
°*  STAT* If patient is at the pharmacy, call can be transferred to refill team.   1. Which medications need to be refilled? (please list name of each medication and dose if known) Furosemide and Potassium-new prescription,saw Dr C on Wednesday and it was supposed to have been called in  2. Which pharmacy/location (including street and city if local pharmacy) is medication to be sent to?[facility]  3. Do they need a 30 day or 90 day supply? Whatever amount he prescribe

## 2015-11-26 ENCOUNTER — Other Ambulatory Visit: Payer: Self-pay | Admitting: Cardiovascular Disease

## 2015-11-26 NOTE — Telephone Encounter (Signed)
REFILL 

## 2015-11-27 ENCOUNTER — Encounter: Payer: Self-pay | Admitting: Physician Assistant

## 2015-11-27 ENCOUNTER — Ambulatory Visit (INDEPENDENT_AMBULATORY_CARE_PROVIDER_SITE_OTHER): Payer: Medicare Other | Admitting: Physician Assistant

## 2015-11-27 ENCOUNTER — Ambulatory Visit: Payer: Medicare Other | Admitting: Cardiology

## 2015-11-27 VITALS — BP 125/70 | HR 60 | Ht 61.0 in | Wt 148.6 lb

## 2015-11-27 DIAGNOSIS — I482 Chronic atrial fibrillation, unspecified: Secondary | ICD-10-CM

## 2015-11-27 DIAGNOSIS — Z950 Presence of cardiac pacemaker: Secondary | ICD-10-CM | POA: Diagnosis not present

## 2015-11-27 DIAGNOSIS — I10 Essential (primary) hypertension: Secondary | ICD-10-CM

## 2015-11-27 DIAGNOSIS — I5033 Acute on chronic diastolic (congestive) heart failure: Secondary | ICD-10-CM | POA: Diagnosis not present

## 2015-11-27 NOTE — Patient Instructions (Addendum)
Medication Instructions:  Your physician recommends that you continue on your current medications as directed. Please refer to the Current Medication list given to you today.   Labwork: NONE ORDERED  Testing/Procedures: NONE ORDERED  Follow-Up: Your physician recommends that you keep your scheduled follow-up appointment in [place]   Any Other Special Instructions Will Be Listed Below (If Applicable).  Your physician recommends that you weigh, daily, at the same time every day, and in the same amount of clothing. Please record your daily weights on the handout provided and bring it to your next appointment. If your weight goes above 145 lbs or is you start swelling and having shortness of breath, then take an extra lasix and potassium   Hyponatremia Hyponatremia is when the amount of salt (sodium) in your blood is too low. When sodium levels are low, your cells absorb extra water and they swell. The swelling happens throughout the body, but it mostly affects the brain. CAUSES This condition may be caused by:  Heart, kidney, or liver problems.  Thyroid problems.  Adrenal gland problems.  Metabolic conditions, such as syndrome of inappropriate antidiuretic hormone (SIADH).  Severe vomiting and diarrhea.  Certain medicines or illegal drugs.  Dehydration.  Drinking too much water.  Eating a diet that is low in sodium.  Large burns on your body.  Sweating. RISK FACTORS This condition is more likely to develop in people who:  Have long-term (chronic) kidney disease.  Have heart failure.  Have a medical condition that causes frequent or excessive diarrhea.  Have metabolic conditions, such as Addison disease or SIADH.  Take certain medicines that affect the sodium and fluid balance in the blood. Some of these medicine types include:  Diuretics.  NSAIDs.  Some opioid pain medicines.  Some antidepressants.  Some seizure prevention  medicines. SYMPTOMS  Symptoms of this condition include:  Nausea and vomiting.  Confusion.  Lethargy.  Agitation.  Headache.  Seizures.  Unconsciousness.  Appetite loss.  Muscle weakness and cramping.  Feeling weak or light-headed.  Having a rapid heart rate.  Fainting, in severe cases. DIAGNOSIS This condition is diagnosed with a medical history and physical exam. You will also have other tests, including:  Blood tests.  Urine tests. TREATMENT Treatment for this condition depends on the cause. Treatment may include:  Fluids given through an IV tube that is inserted into one of your veins.  Medicines to correct the sodium imbalance. If medicines are causing the condition, the medicines will need to be adjusted.  Limiting water or fluid intake to get the correct sodium balance. HOME CARE INSTRUCTIONS  Take medicines only as directed by your health care provider. Many medicines can make this condition worse. Talk with your health care provider about any medicines that you are currently taking.  Carefully follow a recommended diet as directed by your health care provider.  Carefully follow instructions from your health care provider about fluid restrictions.  Keep all follow-up visits as directed by your health care provider. This is important.  Do not drink alcohol. SEEK MEDICAL CARE IF:  You develop worsening nausea, fatigue, headache, confusion, or weakness.  Your symptoms go away and then return.  You have problems following the recommended diet. SEEK IMMEDIATE MEDICAL CARE IF:  You have a seizure.  You faint.  You have ongoing diarrhea or vomiting.   This information is not intended to replace advice given to you by your health care provider. Make sure you discuss any questions you  have with your health care provider.   Document Released: 08/28/2002 Document Revised: 01/22/2015 Document Reviewed: 09/27/2014 Elsevier Interactive Patient Education  [employer].

## 2015-11-27 NOTE — Progress Notes (Signed)
Cardiology Office Note   Date:  11/27/2015   ID:  Carrie [name], DOB 1940/11/15, MRN [id number]  PCP:  Elyn Peers, MD  Cardiologist:  Dr. Sallyanne Kuster   Follow up- diastolic CHF    History of Present Illness: Carrie [name] is a 75 y.o. 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  In December 2012 she had subarachnoid hemorrhage and in June 2013 she had intrahepatic hemorrhage requiring arterial embolization and we are keeping her INR in the lower range (2.0-2.5). She has normal coronary arteries by previous angiography (2004) normal left ventricular systolic function by echocardiography (Oct 2015).  She was seen in the office by Dr. Sallyanne Kuster on 11/13/15 for evaluation of acute on chronic diastolic heart failure. He felt like worsening heart failure may be secondary to a higher percentage of ventricular pacing noted on interrogation of her device. Therefore, he decreased her beta blocker dose to try to lessen her amount of ventricular pacing. He also made note that we may need to decrease the lower rate limit to 50 bpm to reduce ventricular pacing, but put this off for a future time. He advised her to go home and take her weight and subtract 6 pounds and make this her new target weight. He advised her to take Lasix and potassium for any weight over that target weight. Metoprolol was decreased to 25 mg twice a day.  Patient presents to clinic for follow-up. The patient did not quite understand the directions that she was given at her last visit and has not taken any Lasix at  all. She has kept a log of daily weights and it appears that her weight has gone down about 6 pounds since the last visit and no diuretic therapy. Today her home weight was 144.6 and she is feeling well with no more lower extremity edema, shortness of breath or orthopnea. We decided that 145 will be her new dry weight.  We had her pacemaker interrogated today which showed that she is V pacing 83.3% of the time despite having lowered her beta blocker dosage.  Past Medical History  Diagnosis Date  . Hypertension   . Atrial fibrillation ([place])     normal coronaries cath 2004. Dr Rex Kras [facility]   . Sick sinus syndrome ([facility])     Dr Crissie Sickles. EP study negative. Pacemaker 12/15/06 Medtronic  . Hyperlipidemia   . Anxiety   . Diverticula of colon   . Hemorrhage intraabdominal 03/17/2012  . 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 Jude mechanical prosthesis. LA severe dilated  . Subdural hematoma ([place])   . Liver hemorrhage   . Atrial flutter ([place])   . Pacemaker     medtronic adapta  . Heart murmur   . CHF (congestive heart failure) ([place])   . Pneumonia 2009    resolved.? OPD Rx  . Exertional shortness of breath   . History of blood transfusion     "once" (03/15/2013)  . GERD (gastroesophageal reflux disease)   . Migraines   . Stroke [facility])     "they say I had a stroke last year" denies residual on 03/15/2013  .  Arthritis     "right shoulder" (03/15/2013)  . Anticoagulated on Coumadin     for mech valve and atrial fib  goal 2.0-2.5    Past Surgical History  Procedure Laterality Date  . Mitral valve replacement  1998    St Jude mechanical; Dr. [name]  . Appendectomy    . Tubal ligation    . Tee without cardioversion  09/23/2011    Procedure: TRANSESOPHAGEAL ECHOCARDIOGRAM (TEE);  Surgeon: [name];  Location: [facility] ENDOSCOPY;  Service: Cardiovascular;  Laterality: N/A;  . Insert / replace / remove pacemaker  12/15/2006    Medtronic  . US echocardiography   11/19/2011    EF 50-55%,RA mod to severely dilated,LA severely dilated,trace MR,small vegetation or mass on the MV,AOV mildly scleroticmild PI, RV pressure 40-62mmHg  . Persantine cardiolite  08/07/03    mild inf. ischemia   . Tonsillectomy    . Exploratory laparotomy      "had a growth on my intestines" (03/15/2013)  . Cataract extraction w/ intraocular lens  implant, bilateral Bilateral 2013  . Dilation and curettage of uterus      "had fibroids" (03/15/2013)  . Cardiac catheterization  04/02/97    R&L:severe MR/pulmonary hypertension  . Hammer toe surgery Right   . Pacemaker placement  12/15/06    medtronic adapta for SSS  . Permanent pacemaker generator change N/A 07/24/2014    Procedure: PERMANENT PACEMAKER GENERATOR CHANGE;  Surgeon: [name], MD;  Location: [facility] [place] CATH LAB;  Service: Cardiovascular;  Laterality: N/A;     Current Outpatient Prescriptions  Medication Sig Dispense Refill  . Biotin 1000 MCG tablet Take 1,000 mcg by mouth daily.    . chlorthalidone (HYGROTON) 25 MG tablet Take 0.5 tablets (12.5 mg total) by mouth daily. 15 tablet 3  . diltiazem (CARTIA XT) 120 MG 24 hr capsule Take 1 capsule (120 mg total) by mouth daily. 90 capsule 3  . furosemide (LASIX) 20 MG tablet TAKE 1 TABLET BY MOUTH DAILY 90 tablet 0  . metoprolol tartrate (LOPRESSOR) 25 MG tablet Take 1 tablet (25 mg total) by mouth 2 (two) times daily. 180 tablet 3  . Multiple Vitamin (MULTIVITAMINS PO) Take 1 tablet by mouth daily.     . Omega-3 Fatty Acids (FISH OIL) 1000 MG CAPS Take 1,000 mg by mouth daily.    Marland Kitchen OVER THE COUNTER MEDICATION Place 1 drop into both eyes 2 (two) times daily. Drops for dry eyes    . polyethylene glycol (MIRALAX / GLYCOLAX) packet Take 17 g by mouth as needed (for constipation).     . potassium chloride SA (K-DUR,KLOR-CON) 20 MEQ tablet Take 1 tablet (20 mEq total) by mouth daily. 30 tablet 5  . warfarin (COUMADIN) 5 MG tablet TAKE 1 TABLET BY MOUTH DAILY AS DIRECTED 90 tablet  2   No current facility-administered medications for this visit.    Allergies:   Codeine    Social History:  The patient  reports that she has never smoked. She has never used smokeless tobacco. She reports that she does not drink alcohol or use illicit drugs.   Family History:  The patient's family history includes Cancer in her mother and sister; Diabetes in her sister; Healthy in her brother, brother, brother, and sister; Heart attack in her maternal grandfather; Kidney disease in her daughter; Leukemia in her sister; Stroke in her brother and father.    ROS:  Please see the history of present illness.   Otherwise, review of systems are positive  for none.   All other systems are reviewed and negative.    PHYSICAL EXAM: VS:  BP 125/70 mmHg  Pulse 60  Ht 5\' 1"  (1.549 m)  Wt 148 lb 9.6 oz (67.405 kg)  BMI 28.09 kg/m2 , BMI Body mass index is 28.09 kg/(m^2). GEN: Well nourished, well developed, in no acute distress HEENT: normal Neck: no JVD, carotid bruits, or masses Cardiac: RRR; no murmurs, rubs, or gallops,no edema  Respiratory:  clear to auscultation bilaterally, normal work of breathing GI: soft, nontender, nondistended, + BS MS: no deformity or atrophy Skin: warm and dry, no rash Neuro:  Strength and sensation are intact Psych: euthymic mood, full affect   EKG:  EKG is ordered today. The ekg ordered today demonstrates v paced   Recent Labs: 12/03/2014: ALT 24; BUN 18.7; Creatinine 0.9; HGB 14.4; Platelets 118*; Potassium 3.9; Sodium 139    Lipid Panel No results found for: CHOL, TRIG, HDL, CHOLHDL, VLDL, LDLCALC, LDLDIRECT    Wt Readings from Last 3 Encounters:  11/27/15 148 lb 9.6 oz (67.405 kg)  11/13/15 150 lb (68.04 kg)  08/12/15 152 lb (68.947 kg)      Other studies Reviewed: Additional studies/ records that were reviewed today include: 2D ECHO Review of the above records demonstrates:   2D ECHO: 08/22/2015 LV EF: 55% - 60% Study Conclusions -  Left ventricle: The cavity size was normal. Wall thickness was  normal. Systolic function was normal. The estimated ejection  fraction was in the range of 55% to 60%. Wall motion was normal;  there were no regional wall motion abnormalities. The study is  not technically sufficient to allow evaluation of LV diastolic  function. - Aortic valve: Trileaflet. Sclerosis without stenosis. There was  trivial regurgitation. - Mitral valve: Mechanical tilting bileaflet valve. Normal motion.  Appropriate gradients without obstruction. Mean gradient (D): 7  mm Hg. Valve area by continuity equation (using LVOT flow): 1.49  cm^2. - Left atrium: Severely dilated at 46 cm2. - Right ventricle: The cavity size was moderately dilated. Pacer  wire or catheter noted in right ventricle. - Right atrium: Severely dilated at 42 cm2. Pacer wire or catheter  noted in right atrium. - Tricuspid valve: There was moderate to severe regurgitation. - Pulmonic valve: There was mild regurgitation. - Pulmonary arteries: PA peak pressure: 26 mm Hg (S). - Systemic veins: The IVC measures >2.1 cm, but collapses >50%,  suggesting an elevated RA pressure of 8 mmHg. Impressions - Compared to a prior study in 2015, there are few changes. There  is now moderate to severe TR. The mechanical mitral valve is not  obstructed.   ASSESSMENT AND PLAN:  Carrie [name] is a 75 y.o. 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  Acute on chronic diastolic congestive heart failure:  The patient did not  quite understand the directions that she was given at her last visit and has not taken any Lasix at all. She has kept a log of daily weights and it appears that has autodiuresed and her weight has gone down about 6 pounds since the last visit on no diuretic therapy. Today her home weight was 144.6  and she is feeling well with no more lower extremity edema, shortness of breath or orthopnea. We decided that 145 will be her new dry weight. She will take a Lasix and potassium for weights over 145 pounds (on her home scale and 148.6 on our scale) or if she has symptoms of heart failure.  AFib: Rate control felt to possibly be "too good" and her beta blocker was decreased in order to decrease ventricular pacing. However we interrogated her pacemaker today and it showed that she was V pacing 83.3% of the time which was exact same as 2 weeks ago. As previously written by Dr. Sallyanne Kuster, we will lower rate limit to 50 bpm to reduce ventricular pacing.   Anticoagulation with previous severe bleeding complications, keeping INR in the 2.0-2.5 range due to history of subdural hematoma and liver hemorrhage  PPM: single chamber Medtronic Adapta, 2008. Interrogated today and still V pacing 83.3% of the time despite decreasing BB dose. we lowered the rate limit to 50 bpm to try to reduce ventricular pacing.   Mechanical mitral valve replacement: She has a large diameter valve that had normal function by a relatively recent echocardiogram.  Moderate TR is probably contributing to edema in jugular venous distention  HTN is well controlled   Current medicines are reviewed at length with the patient today.  The patient does not have concerns regarding medicines.  The following changes have been made:  She will take lasix/kdur for weights over 145 at home.   Labs/ tests ordered today include:  Orders Placed This Encounter  Procedures  . EKG 12-Lead     Disposition:   FU with Dr. Sallyanne Kuster as previously scheduled  or sooner if needed.   Renea Ee  11/27/2015 3:04 PM    [facility] [address], [place], [place]  [address] Phone: [phone]; Fax: [phone]

## 2015-12-05 ENCOUNTER — Other Ambulatory Visit: Payer: Self-pay

## 2015-12-05 DIAGNOSIS — D696 Thrombocytopenia, unspecified: Secondary | ICD-10-CM

## 2015-12-05 DIAGNOSIS — Z7901 Long term (current) use of anticoagulants: Secondary | ICD-10-CM

## 2015-12-05 DIAGNOSIS — R58 Hemorrhage, not elsewhere classified: Secondary | ICD-10-CM

## 2015-12-06 LAB — CUP PACEART REMOTE DEVICE CHECK
Battery Impedance: 283 Ohm
Battery Voltage: 2.78 V
Implantable Lead Location: 753860
Lead Channel Impedance Value: 0 Ohm
Lead Channel Impedance Value: 415 Ohm
Lead Channel Pacing Threshold Pulse Width: 0.4 ms
Lead Channel Setting Pacing Amplitude: 2.5 V
MDC IDC LEAD IMPLANT DT: 20080326
MDC IDC MSMT BATTERY REMAINING LONGEVITY: 91 mo
MDC IDC MSMT LEADCHNL RV PACING THRESHOLD AMPLITUDE: 0.875 V
MDC IDC SESS DTM: 20170222193352
MDC IDC SET LEADCHNL RV PACING PULSEWIDTH: 0.4 ms
MDC IDC SET LEADCHNL RV SENSING SENSITIVITY: 2.8 mV
MDC IDC STAT BRADY RV PERCENT PACED: 83 %

## 2015-12-08 NOTE — Assessment & Plan Note (Signed)
platelet count 117 asymptomatic, new onset: I discussed with her that this is the first in her platelet count had gone lower. She was started on chlorthalidone recently. I suspect that it may be related to that.  I reviewed her blood counts and there is no evidence of anemia. Dr. Humphrey Rolls had been following her for low-grade hemolysis. 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 There is no evidence of anemia or hemolysis on the blood counts today.  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 In order to be thorough, I would like to check quantitative immunoglobulins and serum protein phoresis with the next blood work to be done in one year.  Chronic anticoagulation on Coumadin.

## 2015-12-09 ENCOUNTER — Other Ambulatory Visit: Payer: Medicare Other

## 2015-12-09 ENCOUNTER — Ambulatory Visit (HOSPITAL_BASED_OUTPATIENT_CLINIC_OR_DEPARTMENT_OTHER): Payer: Medicare Other | Admitting: Hematology and Oncology

## 2015-12-09 ENCOUNTER — Ambulatory Visit (HOSPITAL_BASED_OUTPATIENT_CLINIC_OR_DEPARTMENT_OTHER): Payer: Medicare Other

## 2015-12-09 ENCOUNTER — Ambulatory Visit (INDEPENDENT_AMBULATORY_CARE_PROVIDER_SITE_OTHER): Payer: Medicare Other | Admitting: Pharmacist Clinician (PhC)/ Clinical Pharmacy Specialist

## 2015-12-09 ENCOUNTER — Encounter: Payer: Medicare Other | Admitting: Pharmacist Clinician (PhC)/ Clinical Pharmacy Specialist

## 2015-12-09 ENCOUNTER — Telehealth: Payer: Self-pay | Admitting: Hematology and Oncology

## 2015-12-09 ENCOUNTER — Encounter: Payer: Self-pay | Admitting: Hematology and Oncology

## 2015-12-09 VITALS — BP 142/79 | HR 96 | Temp 97.4°F | Resp 18 | Wt 150.2 lb

## 2015-12-09 DIAGNOSIS — D696 Thrombocytopenia, unspecified: Secondary | ICD-10-CM

## 2015-12-09 DIAGNOSIS — Z952 Presence of prosthetic heart valve: Secondary | ICD-10-CM

## 2015-12-09 DIAGNOSIS — D890 Polyclonal hypergammaglobulinemia: Secondary | ICD-10-CM

## 2015-12-09 DIAGNOSIS — Z7901 Long term (current) use of anticoagulants: Secondary | ICD-10-CM

## 2015-12-09 DIAGNOSIS — Z954 Presence of other heart-valve replacement: Secondary | ICD-10-CM

## 2015-12-09 DIAGNOSIS — I482 Chronic atrial fibrillation, unspecified: Secondary | ICD-10-CM

## 2015-12-09 DIAGNOSIS — R58 Hemorrhage, not elsewhere classified: Secondary | ICD-10-CM

## 2015-12-09 LAB — CBC WITH DIFFERENTIAL/PLATELET
BASO%: 0.9 % (ref 0.0–2.0)
Basophils Absolute: 0.1 10*3/uL (ref 0.0–0.1)
EOS ABS: 0.2 10*3/uL (ref 0.0–0.5)
EOS%: 2.6 % (ref 0.0–7.0)
HEMATOCRIT: 34.3 % — AB (ref 34.8–46.6)
HEMOGLOBIN: 11.7 g/dL (ref 11.6–15.9)
LYMPH#: 1.4 10*3/uL (ref 0.9–3.3)
LYMPH%: 20.9 % (ref 14.0–49.7)
MCH: 31 pg (ref 25.1–34.0)
MCHC: 34.1 g/dL (ref 31.5–36.0)
MCV: 90.7 fL (ref 79.5–101.0)
MONO#: 0.9 10*3/uL (ref 0.1–0.9)
MONO%: 14.4 % — ABNORMAL HIGH (ref 0.0–14.0)
NEUT%: 61.2 % (ref 38.4–76.8)
NEUTROS ABS: 4 10*3/uL (ref 1.5–6.5)
NRBC: 0 % (ref 0–0)
PLATELETS: 163 10*3/uL (ref 145–400)
RBC: 3.78 10*6/uL (ref 3.70–5.45)
RDW: 18.4 % — AB (ref 11.2–14.5)
WBC: 6.5 10*3/uL (ref 3.9–10.3)

## 2015-12-09 LAB — TECHNOLOGIST REVIEW

## 2015-12-09 LAB — POCT INR: INR: 2.4

## 2015-12-09 NOTE — Progress Notes (Signed)
Patient Care Team: Lucianne Lei, MD as PCP - General (Family Medicine)  DIAGNOSIS: thrombocytopenia  CHIEF COMPLIANT: follow-up of low platelet count  INTERVAL HISTORY: Carrie [name] is a 75 year old with above-mentioned history of mild normocytic anemia that I'm following her annually. She reports no excessive bleeding problems. She is on anticoagulation with Coumadin and appears to be well controlled. She does have very intermittent nose bleeds related to being on Coumadin therapy.  REVIEW OF SYSTEMS:   Constitutional: Denies fevers, chills or abnormal weight loss Eyes: Denies blurriness of vision Ears, nose, mouth, throat, and face: Denies mucositis or sore throat Respiratory: Denies cough, dyspnea or wheezes Cardiovascular: Denies palpitation, chest discomfort Gastrointestinal:  Denies nausea, heartburn or change in bowel habits Skin: Denies abnormal skin rashes Lymphatics: Denies new lymphadenopathy or easy bruising Neurological:Denies numbness, tingling or new weaknesses Behavioral/Psych: Mood is stable, no new changes  Extremities: No lower extremity edema All other systems were reviewed with the patient and are negative.  I have reviewed the past medical history, past surgical history, social history and family history with the patient and they are unchanged from previous note.  ALLERGIES:  is allergic to codeine.  MEDICATIONS:  Current Outpatient Prescriptions  Medication Sig Dispense Refill  . Biotin 1000 MCG tablet Take 1,000 mcg by mouth daily.    . chlorthalidone (HYGROTON) 25 MG tablet Take 0.5 tablets (12.5 mg total) by mouth daily. 15 tablet 3  . diltiazem (CARTIA XT) 120 MG 24 hr capsule Take 1 capsule (120 mg total) by mouth daily. 90 capsule 3  . furosemide (LASIX) 20 MG tablet TAKE 1 TABLET BY MOUTH DAILY 90 tablet 0  . metoprolol tartrate (LOPRESSOR) 25 MG tablet Take 1 tablet (25 mg total) by mouth 2 (two) times daily. 180 tablet 3  . Multiple Vitamin  (MULTIVITAMINS PO) Take 1 tablet by mouth daily.     . Omega-3 Fatty Acids (FISH OIL) 1000 MG CAPS Take 1,000 mg by mouth daily.    Marland Kitchen OVER THE COUNTER MEDICATION Place 1 drop into both eyes 2 (two) times daily. Drops for dry eyes    . polyethylene glycol (MIRALAX / GLYCOLAX) packet Take 17 g by mouth as needed (for constipation).     . potassium chloride SA (K-DUR,KLOR-CON) 20 MEQ tablet Take 1 tablet (20 mEq total) by mouth daily. 30 tablet 5  . warfarin (COUMADIN) 5 MG tablet TAKE 1 TABLET BY MOUTH DAILY AS DIRECTED 90 tablet 2   No current facility-administered medications for this visit.    PHYSICAL EXAMINATION: ECOG PERFORMANCE STATUS: 1 - Symptomatic but completely ambulatory  Filed Vitals:   12/09/15 1050  BP: 142/79  Pulse: 96  Temp: 97.4 F (36.3 C)  Resp: 18   Filed Weights   12/09/15 1050  Weight: 150 lb 3.2 oz (68.13 kg)    GENERAL:alert, no distress and comfortable SKIN: skin color, texture, turgor are normal, no rashes or significant lesions EYES: normal, Conjunctiva are pink and non-injected, sclera clear OROPHARYNX:no exudate, no erythema and lips, buccal mucosa, and tongue normal  NECK: supple, thyroid normal size, non-tender, without nodularity LYMPH:  no palpable lymphadenopathy in the cervical, axillary or inguinal LUNGS: clear to auscultation and percussion with normal breathing effort HEART: artificial valve ABDOMEN:abdomen soft, non-tender and normal bowel sounds MUSCULOSKELETAL:no cyanosis of digits and no clubbing  NEURO: alert & oriented x 3 with fluent speech, no focal motor/sensory deficits EXTREMITIES: No lower extremity edema  LABORATORY DATA:  I have reviewed the data as listed  Chemistry      Component Value Date/Time   NA 139 12/03/2014 0959   NA 136 07/18/2014 1332   NA 138 03/14/2010 1318   K 3.9 12/03/2014 0959   K 4.1 07/18/2014 1332   K 4.1 03/14/2010 1318   CL 102 07/18/2014 1332   CL 100 03/14/2010 1318   CO2 26 12/03/2014  0959   CO2 25 07/18/2014 1332   CO2 27 03/14/2010 1318   BUN 18.7 12/03/2014 0959   BUN 14 07/18/2014 1332   BUN 14 03/14/2010 1318   CREATININE 0.9 12/03/2014 0959   CREATININE 0.94 07/18/2014 1332   CREATININE 0.75 03/21/2013 0858      Component Value Date/Time   CALCIUM 10.5* 12/03/2014 0959   CALCIUM 9.9 07/18/2014 1332   CALCIUM 9.9 03/14/2010 1318   ALKPHOS 106 12/03/2014 0959   ALKPHOS 115 07/18/2014 1332   ALKPHOS 125* 03/14/2010 1318   AST 58* 12/03/2014 0959   AST 51* 07/18/2014 1332   AST 46* 03/14/2010 1318   ALT 24 12/03/2014 0959   ALT 23 07/18/2014 1332   ALT 22 03/14/2010 1318   BILITOT 0.50 12/03/2014 0959   BILITOT 0.5 07/18/2014 1332   BILITOT 0.70 03/14/2010 1318       Lab Results  Component Value Date   WBC 5.7 12/03/2014   HGB 14.4 12/03/2014   HCT 42.8 12/03/2014   MCV 92.8 12/03/2014   PLT 118* 12/03/2014   NEUTROABS 3.2 12/03/2014     ASSESSMENT & PLAN:  Thrombocytopenia We are awaiting today's blood work for the platelet count check.  Dr. Humphrey Rolls had been following her for low-grade hemolysis. 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 There is no evidence of anemia or hemolysis on the blood counts today.  Polyclonal gammopathy: quantitative immunoglobulins have been ordered.  Chronic anticoagulation on Coumadin for the artificial valve.  If the platelet counts remain stable, we will see her back in one year for follow-up.  No orders of the defined types were placed in this encounter.   The patient has a good understanding of the overall plan. she agrees with it. she will call with any problems that may develop before the next visit here.   Rulon Eisenmenger, MD 12/09/2015

## 2015-12-09 NOTE — Telephone Encounter (Signed)
appt made and avs printed. Sent patient back to lab per 3/20 pof

## 2015-12-09 NOTE — Progress Notes (Signed)
Unable to get in to exam room prior to MD.  No assessment performed.   Per Dr. Lindi Adie, let patient know her platelets are normal, that Dr. Lindi Adie feels she does not need further follow up, and that we are available should she need to see [place] in the future.  Pt voiced understanding.

## 2015-12-11 ENCOUNTER — Encounter: Payer: Self-pay | Admitting: Cardiology

## 2015-12-12 DIAGNOSIS — Z961 Presence of intraocular lens: Secondary | ICD-10-CM | POA: Diagnosis not present

## 2015-12-12 DIAGNOSIS — H26493 Other secondary cataract, bilateral: Secondary | ICD-10-CM | POA: Diagnosis not present

## 2015-12-19 DIAGNOSIS — H26492 Other secondary cataract, left eye: Secondary | ICD-10-CM | POA: Diagnosis not present

## 2016-01-13 ENCOUNTER — Ambulatory Visit (INDEPENDENT_AMBULATORY_CARE_PROVIDER_SITE_OTHER): Payer: Medicare Other | Admitting: Pharmacist

## 2016-01-13 DIAGNOSIS — I482 Chronic atrial fibrillation, unspecified: Secondary | ICD-10-CM

## 2016-01-13 DIAGNOSIS — Z7901 Long term (current) use of anticoagulants: Secondary | ICD-10-CM | POA: Diagnosis not present

## 2016-01-13 DIAGNOSIS — Z952 Presence of prosthetic heart valve: Secondary | ICD-10-CM

## 2016-01-13 DIAGNOSIS — Z954 Presence of other heart-valve replacement: Secondary | ICD-10-CM | POA: Diagnosis not present

## 2016-01-13 LAB — POCT INR: INR: 2.5

## 2016-01-15 DIAGNOSIS — I10 Essential (primary) hypertension: Secondary | ICD-10-CM | POA: Diagnosis not present

## 2016-02-10 ENCOUNTER — Encounter: Payer: Medicare Other | Admitting: Pharmacist Clinician (PhC)/ Clinical Pharmacy Specialist

## 2016-02-11 ENCOUNTER — Encounter: Payer: Self-pay | Admitting: Cardiovascular Disease

## 2016-02-11 ENCOUNTER — Ambulatory Visit (INDEPENDENT_AMBULATORY_CARE_PROVIDER_SITE_OTHER): Payer: Medicare Other | Admitting: Pharmacist

## 2016-02-11 ENCOUNTER — Ambulatory Visit (INDEPENDENT_AMBULATORY_CARE_PROVIDER_SITE_OTHER): Payer: Medicare Other | Admitting: Cardiovascular Disease

## 2016-02-11 VITALS — BP 122/71 | HR 67 | Ht 61.0 in | Wt 147.8 lb

## 2016-02-11 DIAGNOSIS — Z954 Presence of other heart-valve replacement: Secondary | ICD-10-CM | POA: Diagnosis not present

## 2016-02-11 DIAGNOSIS — I482 Chronic atrial fibrillation, unspecified: Secondary | ICD-10-CM

## 2016-02-11 DIAGNOSIS — Z7901 Long term (current) use of anticoagulants: Secondary | ICD-10-CM

## 2016-02-11 DIAGNOSIS — Z950 Presence of cardiac pacemaker: Secondary | ICD-10-CM

## 2016-02-11 DIAGNOSIS — I10 Essential (primary) hypertension: Secondary | ICD-10-CM

## 2016-02-11 DIAGNOSIS — I5033 Acute on chronic diastolic (congestive) heart failure: Secondary | ICD-10-CM | POA: Diagnosis not present

## 2016-02-11 DIAGNOSIS — I071 Rheumatic tricuspid insufficiency: Secondary | ICD-10-CM

## 2016-02-11 DIAGNOSIS — Z952 Presence of prosthetic heart valve: Secondary | ICD-10-CM

## 2016-02-11 DIAGNOSIS — I495 Sick sinus syndrome: Secondary | ICD-10-CM

## 2016-02-11 LAB — CUP PACEART INCLINIC DEVICE CHECK
Battery Remaining Longevity: 100 mo
Battery Voltage: 2.78 V
Brady Statistic RV Percent Paced: 54 %
Date Time Interrogation Session: 20170523133114
Implantable Lead Implant Date: 20080326
Implantable Lead Location: 753860
Lead Channel Pacing Threshold Amplitude: 1 V
Lead Channel Pacing Threshold Pulse Width: 0.4 ms
Lead Channel Sensing Intrinsic Amplitude: 8 mV
MDC IDC MSMT BATTERY IMPEDANCE: 283 Ohm
MDC IDC MSMT LEADCHNL RA IMPEDANCE VALUE: 0 Ohm
MDC IDC MSMT LEADCHNL RV IMPEDANCE VALUE: 443 Ohm
MDC IDC SET LEADCHNL RV PACING AMPLITUDE: 2.5 V
MDC IDC SET LEADCHNL RV PACING PULSEWIDTH: 0.4 ms
MDC IDC SET LEADCHNL RV SENSING SENSITIVITY: 2.8 mV

## 2016-02-11 LAB — POCT INR: INR: 2

## 2016-02-11 NOTE — Patient Instructions (Signed)
Your physician recommends that you weigh, daily, at the same time every day, and in the same amount of clothing. Please record your daily weights on the handout provided and bring it to your next appointment.  Dr Sallyanne Kuster recommends that you schedule a follow-up appointment in 3 months with a pacemaker check.  If you need a refill on your cardiac medications before your next appointment, please call your pharmacy.

## 2016-02-11 NOTE — Progress Notes (Signed)
Patient ID: Carrie [name], female   DOB: 03-23-1941, 75 y.o.   MRN: [id number] Patient ID: Carrie [name], female   DOB: 10-30-1940, 75 y.o.   MRN: [id number]    Cardiology Office Note    Date:  02/12/2016   ID:  Carrie [name], DOB 12-28-1940, MRN [id number]  PCP:  Elyn Peers, MD  Cardiologist:   [name], MD   Chief Complaint  Patient presents with  . Follow-up  . Leg Swelling    History of Present Illness:  Carrie [name] is a 75 y.o. 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  In December 2012 she had subarachnoid hemorrhage and in June 2013 she had intrahepatic hemorrhage requiring arterial embolization and we are keeping her INR in the lower range (2.0-2.5). She has normal coronary arteries by previous angiography (2004) normal left ventricular systolic function by echocardiography (Oct 2015).  She has problems with worse than usual lower extremity edema. She still has dyspnea on exertion climbing 1 flight of stairs. Her weight has decreased by roughly 2.5 pounds. He has had a few episodes of dizziness and slight disorientation and blurry vision, suggestive of orthostatic hypotension.  Interrogation of her Medtronic Sensia single chamber pacemaker shows normal device function with an estimated generator longevity of 6.5-9 years, good histograms in the absence of episodes of high ventricular rate. She has a lower percentage of ventricular pacing of 52% (at lower rate limit of 60 bpm) .  Past Medical History  Diagnosis Date  . Hypertension   . Atrial fibrillation ([place])     normal coronaries cath  2004. Dr Rex Kras [facility]   . Sick sinus syndrome (HCC)     Dr Crissie Sickles. EP study negative. Pacemaker 12/15/06 Medtronic  . Hyperlipidemia   . Anxiety   . Diverticula of colon   . Hemorrhage intraabdominal 03/17/2012  . 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 Jude mechanical prosthesis. LA severe dilated  . Subdural hematoma ([place])   . Liver hemorrhage   . Atrial flutter ([place])   . Pacemaker     medtronic adapta  . Heart murmur   . CHF (congestive heart failure) ([place])   . Pneumonia 2009    resolved.? OPD Rx  . Exertional shortness of breath   . History of blood transfusion     "once" (03/15/2013)  . GERD (gastroesophageal reflux disease)   . Migraines   . Stroke [facility])     "they say I had a stroke last year" denies residual on 03/15/2013  . Arthritis     "right shoulder" (03/15/2013)  . Anticoagulated on Coumadin     for mech valve and atrial fib  goal 2.0-2.5    Past Surgical History  Procedure Laterality Date  . Mitral valve replacement  1998    St Jude mechanical; Dr. [name]  . Appendectomy    . Tubal ligation    . Tee without cardioversion  09/23/2011    Procedure: TRANSESOPHAGEAL ECHOCARDIOGRAM (TEE);  Surgeon: [name];  Location: [facility] ENDOSCOPY;  Service: Cardiovascular;  Laterality: N/A;  . Insert / replace / remove pacemaker  12/15/2006    Medtronic  . US echocardiography  11/19/2011    EF  50-55%,RA mod to severely dilated,LA severely dilated,trace MR,small vegetation or mass on the MV,AOV mildly scleroticmild PI, RV pressure 40-54mmHg  . Persantine cardiolite  08/07/03    mild inf. ischemia   . Tonsillectomy    . Exploratory laparotomy      "had a growth on my intestines" (03/15/2013)  . Cataract extraction w/ intraocular lens  implant, bilateral Bilateral 2013  . Dilation and curettage of uterus      "had fibroids" (03/15/2013)  . Cardiac catheterization  04/02/97    R&L:severe MR/pulmonary hypertension  . Hammer toe surgery Right   .  Pacemaker placement  12/15/06    medtronic adapta for SSS  . Permanent pacemaker generator change N/A 07/24/2014    Procedure: PERMANENT PACEMAKER GENERATOR CHANGE;  Surgeon: [name], MD;  Location: [place] CATH LAB;  Service: Cardiovascular;  Laterality: N/A;    Outpatient Prescriptions Prior to Visit  Medication Sig Dispense Refill  . Biotin 1000 MCG tablet Take 1,000 mcg by mouth daily.    . chlorthalidone (HYGROTON) 25 MG tablet Take 0.5 tablets (12.5 mg total) by mouth daily. 15 tablet 3  . diltiazem (CARTIA XT) 120 MG 24 hr capsule Take 1 capsule (120 mg total) by mouth daily. 90 capsule 3  . furosemide (LASIX) 20 MG tablet TAKE 1 TABLET BY MOUTH DAILY 90 tablet 0  . metoprolol tartrate (LOPRESSOR) 25 MG tablet Take 1 tablet (25 mg total) by mouth 2 (two) times daily. (Patient taking differently: Take 12.5 mg by mouth 2 (two) times daily. ) 180 tablet 3  . Multiple Vitamin (MULTIVITAMINS PO) Take 1 tablet by mouth daily.     . Omega-3 Fatty Acids (FISH OIL) 1000 MG CAPS Take 1,000 mg by mouth daily.    Marland Kitchen OVER THE COUNTER MEDICATION Place 1 drop into both eyes 2 (two) times daily. Drops for dry eyes    . polyethylene glycol (MIRALAX / GLYCOLAX) packet Take 17 g by mouth as needed (for constipation).     . potassium chloride SA (K-DUR,KLOR-CON) 20 MEQ tablet Take 1 tablet (20 mEq total) by mouth daily. 30 tablet 5  . warfarin (COUMADIN) 5 MG tablet TAKE 1 TABLET BY MOUTH DAILY AS DIRECTED 90 tablet 2   No facility-administered medications prior to visit.     Allergies:   Codeine   Social History   Social History  . Marital Status: Married    Spouse Name: N/A  . Number of Children: N/A  . Years of Education: N/A   Social History Main Topics  . Smoking status: Never Smoker   . Smokeless tobacco: Never Used  . Alcohol Use: No  . Drug Use: No  . Sexual Activity: No   Other Topics Concern  . None   Social History Narrative     Family History:  The patient's family history  includes Cancer in her mother and sister; Diabetes in her sister; Healthy in her brother, brother, brother, and sister; Heart attack in her maternal grandfather; Kidney disease in her daughter; Leukemia in her sister; Stroke in her brother and father.   ROS:   Please see the history of present illness.    ROS All other systems reviewed and are negative.   PHYSICAL EXAM:   VS:  BP 122/71 mmHg  Pulse 67  Ht 5\' 1"  (1.549 m)  Wt 67.042 kg (147 lb 12.8 oz)  BMI 27.94 kg/m2  SpO2 97%   GEN: Well nourished, well developed, in no acute distress HEENT: normal Neck: 8-9 cm  elevated JVP, very prominent V waves, no carotid bruits or masses Cardiac: Irregular, crisp prosthetic valve clicks;  no murmurs, rubs, or gallops, 1+ ankle and pretibial edema, wearingcompression stockings  Respiratory:  clear to auscultation bilaterally, normal work of breathing GI: soft, nontender, nondistended, + BS MS: no deformity or atrophy Skin: warm and dry, no rash Neuro:  Alert and Oriented x 3, Strength and sensation are intact Psych: euthymic mood, full affect  Wt Readings from Last 3 Encounters:  02/11/16 67.042 kg (147 lb 12.8 oz)  12/09/15 68.13 kg (150 lb 3.2 oz)  11/27/15 67.405 kg (148 lb 9.6 oz)      Studies/Labs Reviewed:   EKG:  EKG is not ordered today.    Recent Labs: 12/09/2015: HGB 11.7; Platelets 163    ASSESSMENT:    1. Acute on chronic diastolic congestive heart failure ([place])   2. Chronic atrial fibrillation (HCC)   3. Long term (current) use of anticoagulants   4. Pacemaker - single chamber Medtronic Adapta, 2008   5. S/P mitral valve replacement, St Jude   6. Moderate to severe tricuspid regurgitation   7. Essential hypertension      PLAN:  In order of problems listed above:  1. CHF: Mrs. Krenke has less evidence of hypervolemia and has NYHA functional class II-III dyspnea. He has improved after we increased her dose of diuretics. After changing her dose of metoprolol she  has a lower percentage of ventricular pacing. High-frequency ventricular pacing probably contributed to the heart failure. No episodes of high ventricular rate have been seen since her previous device check, but her histogram has shifted a little bit to the left. She has recently developed some symptoms of hypotension and I don't think we can push the diuretics any further.  2. AFib: Will wait another 3 months before we decide whether we can decrease the rate control medications more. 3. Anticoagulation with previous severe bleeding complications, keeping INR in the 2.0-2.5 range 4. PPM: Normal device function, settings of lower rate limit may need to be changed per considerations described above. We'll decide next appointment 5. Mechanical mitral valve replacement: She has a large diameter valve that had normal function by a relatively recent echocardiogram. 6. Moderate to severe TR is probably contributing to edema and the jugular venous distention. She may have some degree of rheumatic valve disease. The presence of a transvalvular pacemaker lead may also be contributory. There is no real evidence of pulmonary hypertension. Physical exam is compatible with severe tricuspid regurgitation. I don't think should be a great candidate for repeat surgery at age 67, but this opportunity might need to be taken if there is evidence of serious hemodynamic complications or liver abnormalities. 7. HTN is well controlled    Medication Adjustments/Labs and Tests Ordered: Current medicines are reviewed at length with the patient today.  Concerns regarding medicines are outlined above.  Medication changes, Labs and Tests ordered today are listed in the Patient Instructions below. Patient Instructions  Your physician recommends that you weigh, daily, at the same time every day, and in the same amount of clothing. Please record your daily weights on the handout provided and bring it to your next appointment.  Dr  Sallyanne Kuster recommends that you schedule a follow-up appointment in 3 months with a pacemaker check.  If you need a refill on your cardiac medications before your next appointment, please call your pharmacy.       Signed, [name], MD  02/12/2016 8:00 AM  [facility] [address], [place], [place]  [address] Phone: [phone]; Fax: [phone]

## 2016-02-24 ENCOUNTER — Other Ambulatory Visit: Payer: Self-pay | Admitting: Cardiovascular Disease

## 2016-02-24 NOTE — Telephone Encounter (Signed)
Rx(s) sent to pharmacy electronically.  

## 2016-02-28 ENCOUNTER — Encounter: Payer: Self-pay | Admitting: Cardiovascular Disease

## 2016-03-11 ENCOUNTER — Other Ambulatory Visit (HOSPITAL_COMMUNITY): Payer: Self-pay

## 2016-03-11 ENCOUNTER — Inpatient Hospital Stay (HOSPITAL_COMMUNITY)
Admission: EM | Admit: 2016-03-11 | Discharge: 2016-03-23 | DRG: 281 | Disposition: A | Discharge status: Home Health | Payer: Medicare Other | Attending: Internal Medicine | Admitting: Internal Medicine

## 2016-03-11 ENCOUNTER — Emergency Department (HOSPITAL_COMMUNITY): Payer: Medicare Other

## 2016-03-11 ENCOUNTER — Encounter (HOSPITAL_COMMUNITY): Payer: Self-pay | Admitting: Emergency Medicine

## 2016-03-11 ENCOUNTER — Observation Stay (HOSPITAL_BASED_OUTPATIENT_CLINIC_OR_DEPARTMENT_OTHER): Payer: Medicare Other

## 2016-03-11 ENCOUNTER — Other Ambulatory Visit: Payer: Self-pay

## 2016-03-11 DIAGNOSIS — I482 Chronic atrial fibrillation: Secondary | ICD-10-CM | POA: Diagnosis present

## 2016-03-11 DIAGNOSIS — E871 Hypo-osmolality and hyponatremia: Secondary | ICD-10-CM | POA: Diagnosis present

## 2016-03-11 DIAGNOSIS — T502X5A Adverse effect of carbonic-anhydrase inhibitors, benzothiadiazides and other diuretics, initial encounter: Secondary | ICD-10-CM | POA: Diagnosis present

## 2016-03-11 DIAGNOSIS — I4892 Unspecified atrial flutter: Secondary | ICD-10-CM | POA: Diagnosis present

## 2016-03-11 DIAGNOSIS — I4891 Unspecified atrial fibrillation: Secondary | ICD-10-CM | POA: Diagnosis not present

## 2016-03-11 DIAGNOSIS — E785 Hyperlipidemia, unspecified: Secondary | ICD-10-CM | POA: Diagnosis not present

## 2016-03-11 DIAGNOSIS — D649 Anemia, unspecified: Secondary | ICD-10-CM | POA: Diagnosis not present

## 2016-03-11 DIAGNOSIS — Z885 Allergy status to narcotic agent status: Secondary | ICD-10-CM | POA: Diagnosis not present

## 2016-03-11 DIAGNOSIS — K219 Gastro-esophageal reflux disease without esophagitis: Secondary | ICD-10-CM | POA: Diagnosis not present

## 2016-03-11 DIAGNOSIS — Z79899 Other long term (current) drug therapy: Secondary | ICD-10-CM | POA: Diagnosis not present

## 2016-03-11 DIAGNOSIS — I071 Rheumatic tricuspid insufficiency: Secondary | ICD-10-CM | POA: Diagnosis present

## 2016-03-11 DIAGNOSIS — R079 Chest pain, unspecified: Secondary | ICD-10-CM

## 2016-03-11 DIAGNOSIS — F419 Anxiety disorder, unspecified: Secondary | ICD-10-CM | POA: Diagnosis present

## 2016-03-11 DIAGNOSIS — D696 Thrombocytopenia, unspecified: Secondary | ICD-10-CM | POA: Diagnosis not present

## 2016-03-11 DIAGNOSIS — I10 Essential (primary) hypertension: Secondary | ICD-10-CM | POA: Diagnosis not present

## 2016-03-11 DIAGNOSIS — R7989 Other specified abnormal findings of blood chemistry: Secondary | ICD-10-CM

## 2016-03-11 DIAGNOSIS — I272 Other secondary pulmonary hypertension: Secondary | ICD-10-CM | POA: Diagnosis not present

## 2016-03-11 DIAGNOSIS — I361 Nonrheumatic tricuspid (valve) insufficiency: Secondary | ICD-10-CM | POA: Diagnosis not present

## 2016-03-11 DIAGNOSIS — E876 Hypokalemia: Secondary | ICD-10-CM | POA: Diagnosis present

## 2016-03-11 DIAGNOSIS — Z950 Presence of cardiac pacemaker: Secondary | ICD-10-CM

## 2016-03-11 DIAGNOSIS — I4811 Longstanding persistent atrial fibrillation: Secondary | ICD-10-CM | POA: Diagnosis present

## 2016-03-11 DIAGNOSIS — I214 Non-ST elevation (NSTEMI) myocardial infarction: Secondary | ICD-10-CM | POA: Diagnosis not present

## 2016-03-11 DIAGNOSIS — Z7901 Long term (current) use of anticoagulants: Secondary | ICD-10-CM

## 2016-03-11 DIAGNOSIS — I110 Hypertensive heart disease with heart failure: Secondary | ICD-10-CM | POA: Diagnosis present

## 2016-03-11 DIAGNOSIS — Z954 Presence of other heart-valve replacement: Secondary | ICD-10-CM | POA: Diagnosis not present

## 2016-03-11 DIAGNOSIS — Z823 Family history of stroke: Secondary | ICD-10-CM | POA: Diagnosis not present

## 2016-03-11 DIAGNOSIS — Z952 Presence of prosthetic heart valve: Secondary | ICD-10-CM

## 2016-03-11 DIAGNOSIS — N179 Acute kidney failure, unspecified: Secondary | ICD-10-CM | POA: Insufficient documentation

## 2016-03-11 DIAGNOSIS — R0789 Other chest pain: Secondary | ICD-10-CM | POA: Diagnosis not present

## 2016-03-11 DIAGNOSIS — D6489 Other specified anemias: Secondary | ICD-10-CM

## 2016-03-11 DIAGNOSIS — Z8673 Personal history of transient ischemic attack (TIA), and cerebral infarction without residual deficits: Secondary | ICD-10-CM

## 2016-03-11 DIAGNOSIS — I5032 Chronic diastolic (congestive) heart failure: Secondary | ICD-10-CM | POA: Diagnosis present

## 2016-03-11 DIAGNOSIS — R945 Abnormal results of liver function studies: Secondary | ICD-10-CM

## 2016-03-11 LAB — ECHOCARDIOGRAM COMPLETE
AVLVOTPG: 3 mmHg
Area-P 1/2: 3.24 cm2
CHL CUP DOP CALC LVOT VTI: 15.2 cm
CHL CUP LVOT MV VTI: 1.15
CHL CUP MV DEC (S): 246
EWDT: 246 ms
FS: 31 % (ref 28–44)
Height: 61 in
IV/PV OW: 1.04
LA diam index: 3.52 cm/m2
LASIZE: 58 mm
LAVOLA4C: 109 mL
LEFT ATRIUM END SYS DIAM: 58 mm
LV PW d: 10.1 mm — AB (ref 0.6–1.1)
LVOT MV VTI INDEX: 0.7 cm2/m2
LVOT SV: 31 mL
LVOT area: 2.01 cm2
LVOT peak vel: 81.7 cm/s
LVOTD: 16 mm
MV Annulus VTI: 26.6 cm
MV M vel: 83
MV Peak grad: 9 mmHg
MVPKEVEL: 152 m/s
MVSPHT: 64 ms
Mean grad: 4 mmHg
PV Reg vel dias: 88.1 cm/s
Reg peak vel: 327 cm/s
TRMAXVEL: 327 cm/s
Weight: 2328 oz

## 2016-03-11 LAB — BASIC METABOLIC PANEL
Anion gap: 9 (ref 5–15)
BUN: 14 mg/dL (ref 6–20)
CHLORIDE: 100 mmol/L — AB (ref 101–111)
CO2: 23 mmol/L (ref 22–32)
Calcium: 9.8 mg/dL (ref 8.9–10.3)
Creatinine, Ser: 0.92 mg/dL (ref 0.44–1.00)
GFR calc non Af Amer: 60 mL/min — ABNORMAL LOW (ref >60)
Glucose, Bld: 131 mg/dL — ABNORMAL HIGH (ref 65–99)
POTASSIUM: 3.4 mmol/L — AB (ref 3.5–5.1)
SODIUM: 132 mmol/L — AB (ref 135–145)

## 2016-03-11 LAB — CBC WITH DIFFERENTIAL/PLATELET
BASOS PCT: 1 %
Basophils Absolute: 0.1 10*3/uL (ref 0.0–0.1)
EOS PCT: 2 %
Eosinophils Absolute: 0.1 10*3/uL (ref 0.0–0.7)
HCT: 32.8 % — ABNORMAL LOW (ref 36.0–46.0)
Hemoglobin: 11 g/dL — ABNORMAL LOW (ref 12.0–15.0)
LYMPHS ABS: 1.1 10*3/uL (ref 0.7–4.0)
Lymphocytes Relative: 18 %
MCH: 30.4 pg (ref 26.0–34.0)
MCHC: 33.5 g/dL (ref 30.0–36.0)
MCV: 90.6 fL (ref 78.0–100.0)
MONO ABS: 0.6 10*3/uL (ref 0.1–1.0)
Monocytes Relative: 10 %
Neutro Abs: 4 10*3/uL (ref 1.7–7.7)
Neutrophils Relative %: 69 %
PLATELETS: 203 10*3/uL (ref 150–400)
RBC: 3.62 MIL/uL — ABNORMAL LOW (ref 3.87–5.11)
RDW: 19.8 % — AB (ref 11.5–15.5)
WBC: 5.9 10*3/uL (ref 4.0–10.5)

## 2016-03-11 LAB — TROPONIN I
TROPONIN I: 2.01 ng/mL — AB (ref <0.031)
Troponin I: 5.32 ng/mL (ref <0.031)

## 2016-03-11 LAB — PROTIME-INR
INR: 1.98 — ABNORMAL HIGH (ref 0.00–1.49)
PROTHROMBIN TIME: 22.4 s — AB (ref 11.6–15.2)

## 2016-03-11 LAB — HEPARIN LEVEL (UNFRACTIONATED): HEPARIN UNFRACTIONATED: 0.49 [IU]/mL (ref 0.30–0.70)

## 2016-03-11 LAB — I-STAT TROPONIN, ED: Troponin i, poc: 0 ng/mL (ref 0.00–0.08)

## 2016-03-11 MED ORDER — ONDANSETRON HCL 4 MG/2ML IJ SOLN: 4.0000 mg | Freq: Four times a day (QID) | INTRAMUSCULAR | Status: DC | PRN

## 2016-03-11 MED ORDER — ONDANSETRON HCL 4 MG/2ML IJ SOLN
4.0000 mg | Freq: Once | INTRAMUSCULAR | Status: AC
  Administered 2016-03-11: 4 mg via INTRAVENOUS
  Filled 2016-03-11: qty 2

## 2016-03-11 MED ORDER — SODIUM CHLORIDE 0.9 % IV SOLN
INTRAVENOUS | Status: DC
  Administered 2016-03-12: 07:00:00 via INTRAVENOUS

## 2016-03-11 MED ORDER — DILTIAZEM HCL ER COATED BEADS 120 MG PO CP24
120.0000 mg | ORAL_CAPSULE | Freq: Every day | ORAL | Status: DC
  Administered 2016-03-11 – 2016-03-23 (×13): 120 mg via ORAL
  Filled 2016-03-11 (×13): qty 1

## 2016-03-11 MED ORDER — POTASSIUM CHLORIDE CRYS ER 20 MEQ PO TBCR
30.0000 meq | EXTENDED_RELEASE_TABLET | Freq: Two times a day (BID) | ORAL | Status: DC
  Administered 2016-03-11 – 2016-03-16 (×10): 30 meq via ORAL
  Filled 2016-03-11 (×10): qty 1

## 2016-03-11 MED ORDER — ASPIRIN 81 MG PO CHEW
81.0000 mg | CHEWABLE_TABLET | ORAL | Status: AC
  Administered 2016-03-12: 81 mg via ORAL
  Filled 2016-03-11: qty 1

## 2016-03-11 MED ORDER — HEPARIN (PORCINE) IN NACL 100-0.45 UNIT/ML-% IJ SOLN
900.0000 [IU]/h | INTRAMUSCULAR | Status: DC
  Administered 2016-03-11: 900 [IU]/h via INTRAVENOUS
  Filled 2016-03-11: qty 250

## 2016-03-11 MED ORDER — NITROGLYCERIN 2 % TD OINT
1.0000 [in_us] | TOPICAL_OINTMENT | Freq: Once | TRANSDERMAL | Status: AC
  Administered 2016-03-11: 1 [in_us] via TOPICAL
  Filled 2016-03-11: qty 1

## 2016-03-11 MED ORDER — WARFARIN - PHARMACIST DOSING INPATIENT: Freq: Every day | Status: DC

## 2016-03-11 MED ORDER — SODIUM CHLORIDE 0.9% FLUSH: 3.0000 mL | INTRAVENOUS | Status: DC | PRN

## 2016-03-11 MED ORDER — PERFLUTREN LIPID MICROSPHERE
INTRAVENOUS | Status: AC
  Filled 2016-03-11: qty 10

## 2016-03-11 MED ORDER — WARFARIN SODIUM 5 MG PO TABS: 6.0000 mg | ORAL_TABLET | Freq: Once | ORAL | Status: DC

## 2016-03-11 MED ORDER — ACETAMINOPHEN 325 MG PO TABS: 650.0000 mg | ORAL_TABLET | ORAL | Status: DC | PRN

## 2016-03-11 MED ORDER — FUROSEMIDE 20 MG PO TABS
20.0000 mg | ORAL_TABLET | Freq: Every day | ORAL | Status: DC
  Administered 2016-03-11 – 2016-03-15 (×5): 20 mg via ORAL
  Filled 2016-03-11 (×5): qty 1

## 2016-03-11 MED ORDER — SODIUM CHLORIDE 0.9 % IV SOLN
250.0000 mL | INTRAVENOUS | Status: DC | PRN
  Administered 2016-03-11: 250 mL via INTRAVENOUS

## 2016-03-11 MED ORDER — PERFLUTREN LIPID MICROSPHERE
1.0000 mL | INTRAVENOUS | Status: AC | PRN
  Administered 2016-03-11: 2 mL via INTRAVENOUS
  Filled 2016-03-11: qty 10

## 2016-03-11 MED ORDER — METOPROLOL TARTRATE 12.5 MG HALF TABLET
12.5000 mg | ORAL_TABLET | Freq: Two times a day (BID) | ORAL | Status: DC
  Administered 2016-03-11 – 2016-03-23 (×25): 12.5 mg via ORAL
  Filled 2016-03-11 (×25): qty 1

## 2016-03-11 MED ORDER — NITROGLYCERIN 2 % TD OINT
1.0000 [in_us] | TOPICAL_OINTMENT | Freq: Four times a day (QID) | TRANSDERMAL | Status: DC
  Administered 2016-03-11 – 2016-03-12 (×3): 1 [in_us] via TOPICAL
  Filled 2016-03-11: qty 30

## 2016-03-11 MED ORDER — POTASSIUM CHLORIDE CRYS ER 20 MEQ PO TBCR
40.0000 meq | EXTENDED_RELEASE_TABLET | Freq: Once | ORAL | Status: AC
  Administered 2016-03-11: 40 meq via ORAL
  Filled 2016-03-11: qty 2

## 2016-03-11 MED ORDER — GI COCKTAIL ~~LOC~~
30.0000 mL | Freq: Four times a day (QID) | ORAL | Status: DC | PRN
Start: 2016-03-11 — End: 2016-03-23

## 2016-03-11 MED ORDER — MORPHINE SULFATE (PF) 2 MG/ML IV SOLN
2.0000 mg | INTRAVENOUS | Status: DC | PRN
Start: 2016-03-11 — End: 2016-03-23

## 2016-03-11 MED ORDER — MORPHINE SULFATE (PF) 4 MG/ML IV SOLN
4.0000 mg | Freq: Once | INTRAVENOUS | Status: AC
  Administered 2016-03-11: 4 mg via INTRAVENOUS
  Filled 2016-03-11: qty 1

## 2016-03-11 MED ORDER — ATORVASTATIN CALCIUM 80 MG PO TABS
80.0000 mg | ORAL_TABLET | Freq: Every day | ORAL | Status: DC
  Administered 2016-03-11 – 2016-03-22 (×12): 80 mg via ORAL
  Filled 2016-03-11 (×12): qty 1

## 2016-03-11 MED ORDER — SODIUM CHLORIDE 0.9% FLUSH
3.0000 mL | Freq: Two times a day (BID) | INTRAVENOUS | Status: DC
  Administered 2016-03-12 – 2016-03-14 (×3): 3 mL via INTRAVENOUS

## 2016-03-11 MED ORDER — CHLORTHALIDONE 25 MG PO TABS
12.5000 mg | ORAL_TABLET | Freq: Every day | ORAL | Status: DC
  Administered 2016-03-11 – 2016-03-12 (×2): 12.5 mg via ORAL
  Filled 2016-03-11 (×2): qty 0.5

## 2016-03-11 NOTE — Progress Notes (Signed)
ANTICOAGULATION CONSULT NOTE - Initial Consult  Pharmacy Consult for Coumadin Indication: Afib/MVR  Allergies  Allergen Reactions  . Codeine Nausea And Vomiting    Patient Measurements: Height: 5\' 1"  (154.9 cm) Weight: 143 lb (64.864 kg) IBW/kg (Calculated) : 47.8  Vital Signs: Temp: 97.5 F (36.4 C) (06/21 0118) Temp Source: Oral (06/21 0118) BP: 124/66 mmHg (06/21 0603) Pulse Rate: 62 (06/21 0603)  Labs:  Recent Labs  03/11/16 0210 03/11/16 0238 03/11/16 0503  HGB 11.0*  --   --   HCT 32.8*  --   --   PLT 203  --   --   LABPROT  --   --  22.4*  INR  --   --  1.98*  CREATININE  --  0.92  --     Estimated Creatinine Clearance: 46.2 mL/min (by C-G formula based on Cr of 0.92).   Medical History: Past Medical History  Diagnosis Date  . Hypertension   . Atrial fibrillation ([place])     normal coronaries cath 2004. Dr Rex Kras [facility]   . Sick sinus syndrome (HCC)     Dr Crissie Sickles. EP study negative. Pacemaker 12/15/06 Medtronic  . Hyperlipidemia   . Anxiety   . Diverticula of colon   . Hemorrhage intraabdominal 03/17/2012  . 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 Jude mechanical prosthesis. LA severe dilated  . Subdural hematoma ([place])   . Liver hemorrhage   . Atrial flutter ([place])   . Pacemaker     medtronic adapta  . Heart murmur   . CHF (congestive heart failure) ([place])   . Pneumonia 2009    resolved.? OPD Rx  . Exertional shortness of breath   . History of blood transfusion     "once" (03/15/2013)  . GERD (gastroesophageal reflux disease)   . Migraines   . Stroke [facility])     "they say I had a stroke last year" denies residual on 03/15/2013  . Arthritis     "right shoulder" (03/15/2013)  . Anticoagulated on Coumadin     for mech valve and atrial fib  goal 2.0-2.5    Medications:  Prescriptions prior to admission  Medication Sig Dispense Refill Last Dose  . Biotin 1000 MCG tablet Take 1,000 mcg by mouth daily.   03/10/2016 at  Unknown time  . CARTIA XT 120 MG 24 hr capsule TAKE ONE CAPSULE BY MOUTH DAILY 90 capsule 3 03/10/2016 at Unknown time  . chlorthalidone (HYGROTON) 25 MG tablet TAKE 1/2 TABLET BY MOUTH DAILY 15 tablet 8 03/10/2016 at Unknown time  . furosemide (LASIX) 20 MG tablet TAKE 1 TABLET BY MOUTH DAILY 90 tablet 0 03/10/2016 at Unknown time  . metoprolol tartrate (LOPRESSOR) 25 MG tablet Take 1 tablet (25 mg total) by mouth 2 (two) times daily. (Patient taking differently: Take 12.5 mg by mouth 2 (two) times daily. ) 180 tablet 3 03/10/2016 at 1800  . Multiple Vitamin (MULTIVITAMINS PO) Take 1 tablet by mouth daily.    03/10/2016 at Unknown time  . Omega-3 Fatty Acids (FISH OIL) 1000 MG CAPS Take 1,000 mg by mouth daily.   03/10/2016 at Unknown time  . polyethylene glycol (MIRALAX / GLYCOLAX) packet Take 17 g by mouth as needed (for constipation).    03/10/2016 at Unknown time  . potassium chloride SA (K-DUR,KLOR-CON) 20 MEQ tablet Take 1 tablet (20 mEq total) by mouth daily. 30 tablet 5 03/10/2016 at Unknown time  . warfarin (COUMADIN) 5 MG tablet Take 2.5-5 mg  by mouth daily. Take 1/2 tablet on Sunday then take 1 tablet all the other days   03/10/2016 at 1800  . warfarin (COUMADIN) 5 MG tablet TAKE 1 TABLET BY MOUTH DAILY AS DIRECTED (Patient not taking: Reported on 03/11/2016) 90 tablet 2 Not Taking at Unknown time     Assessment: 75yo female c/o CP radiating to LUE associated w/ diaphoresis and SOB, i-stat troponin negative, to continue Coumadin for Afib/MVR during admission for further w/u; of note pt has low INR goal due to history of subdural hematoma and liver hemorrhage; last dose of Coumadin taken 6/20, current INR slightly below goal.  Goal of Therapy:  INR 2-2.5   Plan:  Will give Coumadin 6mg  po x1 today and monitor INR for dose adjustments.  Wynona Neat, PharmD, BCPS  03/11/2016,6:57 AM

## 2016-03-11 NOTE — Care Management Obs Status (Signed)
[place] NOTIFICATION   Patient Details  Name: Carrie [name] MRN: [id number] Date of Birth: [birth year]   Medicare Observation Status Notification Given:  Yes    Bethena Roys, RN 03/11/2016, 11:19 AM

## 2016-03-11 NOTE — Progress Notes (Signed)
Echocardiogram 2D Echocardiogram with Definity has been performed.  Tresa Res 03/11/2016, 4:16 PM

## 2016-03-11 NOTE — Progress Notes (Signed)
Troponin = 2.01. Dr. Wynetta Emery and Idolina Primer, [place] notified.

## 2016-03-11 NOTE — ED Notes (Signed)
Per [facility] Pt coming from home. Pt c/o 10/10 sharp CP that radiates to the L arm. The CP awakened her from her sleep. Pt is in a paced rhythm. EMS gave 1 NTG with no relief. Pt is on coumadin. Pt has a history of CHF.  165/100 158/94 96%RA 122CBG

## 2016-03-11 NOTE — Progress Notes (Signed)
ANTICOAGULATION CONSULT NOTE - Initial Consult  Pharmacy Consult for heparin/warfarin Indication: atrial fibrillation and MVR and ACS/STEMI  Allergies  Allergen Reactions  . Codeine Nausea And Vomiting    Patient Measurements: Height: 5\' 1"  (154.9 cm) Weight: 145 lb 8 oz (65.998 kg) IBW/kg (Calculated) : 47.8 Heparin Dosing Weight: 61.6 kg  Vital Signs: Temp: 97.9 F (36.6 C) (06/21 0656) Temp Source: Oral (06/21 0656) BP: 141/86 mmHg (06/21 0656) Pulse Rate: 62 (06/21 0603)  Labs:  Recent Labs  03/11/16 0210 03/11/16 0238 03/11/16 0503 03/11/16 0747  HGB 11.0*  --   --   --   HCT 32.8*  --   --   --   PLT 203  --   --   --   LABPROT  --   --  22.4*  --   INR  --   --  1.98*  --   CREATININE  --  0.92  --   --   TROPONINI  --   --   --  2.01*    Estimated Creatinine Clearance: 46.7 mL/min (by C-G formula based on Cr of 0.92).   Medical History: Past Medical History  Diagnosis Date  . Hypertension   . Atrial fibrillation ([place])     normal coronaries cath 2004. Dr Rex Kras [facility]   . Sick sinus syndrome (HCC)     Dr Crissie Sickles. EP study negative. Pacemaker 12/15/06 Medtronic  . Hyperlipidemia   . Anxiety   . Diverticula of colon   . Hemorrhage intraabdominal 03/17/2012  . 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 Jude mechanical prosthesis. LA severe dilated  . Subdural hematoma ([place])   . Liver hemorrhage   . Atrial flutter ([place])   . Pacemaker     medtronic adapta  . Heart murmur   . CHF (congestive heart failure) ([place])   . Pneumonia 2009    resolved.? OPD Rx  . Exertional shortness of breath   . History of blood transfusion     "once" (03/15/2013)  . GERD (gastroesophageal reflux disease)   . Migraines   . Stroke [facility])     "they say I had a stroke last year" denies residual on 03/15/2013  . Arthritis     "right shoulder" (03/15/2013)  . Anticoagulated on Coumadin     for mech valve and atrial fib  goal 2.0-2.5    Medications:   Prescriptions prior to admission  Medication Sig Dispense Refill Last Dose  . Biotin 1000 MCG tablet Take 1,000 mcg by mouth daily.   03/10/2016 at Unknown time  . CARTIA XT 120 MG 24 hr capsule TAKE ONE CAPSULE BY MOUTH DAILY 90 capsule 3 03/10/2016 at Unknown time  . chlorthalidone (HYGROTON) 25 MG tablet TAKE 1/2 TABLET BY MOUTH DAILY 15 tablet 8 03/10/2016 at Unknown time  . furosemide (LASIX) 20 MG tablet TAKE 1 TABLET BY MOUTH DAILY 90 tablet 0 03/10/2016 at Unknown time  . metoprolol tartrate (LOPRESSOR) 25 MG tablet Take 1 tablet (25 mg total) by mouth 2 (two) times daily. (Patient taking differently: Take 12.5 mg by mouth 2 (two) times daily. ) 180 tablet 3 03/10/2016 at 1800  . Multiple Vitamin (MULTIVITAMINS PO) Take 1 tablet by mouth daily.    03/10/2016 at Unknown time  . Omega-3 Fatty Acids (FISH OIL) 1000 MG CAPS Take 1,000 mg by mouth daily.   03/10/2016 at Unknown time  . polyethylene glycol (MIRALAX / GLYCOLAX) packet Take 17 g by mouth as needed (  for constipation).    03/10/2016 at Unknown time  . potassium chloride SA (K-DUR,KLOR-CON) 20 MEQ tablet Take 1 tablet (20 mEq total) by mouth daily. 30 tablet 5 03/10/2016 at Unknown time  . warfarin (COUMADIN) 5 MG tablet Take 2.5-5 mg by mouth daily. Take 1/2 tablet on Sunday then take 1 tablet all the other days   03/10/2016 at 1800  . warfarin (COUMADIN) 5 MG tablet TAKE 1 TABLET BY MOUTH DAILY AS DIRECTED (Patient not taking: Reported on 03/11/2016) 90 tablet 2 Not Taking at Unknown time   Scheduled:  . atorvastatin  80 mg Oral q1800  . chlorthalidone  12.5 mg Oral Daily  . diltiazem  120 mg Oral Daily  . furosemide  20 mg Oral Daily  . metoprolol tartrate  12.5 mg Oral BID  . potassium chloride  30 mEq Oral BID  . Warfarin - Pharmacist Dosing Inpatient   Does not apply q1800    Assessment: 87 yoF c/o CP radiating to LUE associated w/ diaphoresis and SOB, troponin now positive, to start heparin and hold warfarin with possible plan  for cath.  Pt on chronic warfarin for Afib/MVR; of note pt has low INR goal (2.0-2.5) due to history of subdural hematoma and liver hemorrhage; last dose of Coumadin taken 6/20, current INR slightly below goal. PTA warfarin dose: 2.5 mg on Sundays, and 5 mg all other days   Goal of Therapy:  INR 2.0-2.5 Heparin level 0.3-0.7 units/ml Monitor platelets by anticoagulation protocol: Yes   Plan:  Start heparin infusion at 900 units/hr No Bolus Hold warfarin, plan for cath when INR < 1.7 Check anti-Xa level in 8 hours and daily while on heparin Continue to monitor H&H and platelets Monitor for restart warfarin, daily INR, clinical course, s/sx of bleed   Thank you for allowing [place] to participate in this patients care. Jens Som, PharmD Pager: [phone]  03/11/2016,11:32 AM

## 2016-03-11 NOTE — Plan of Care (Signed)
Problem: Consults Goal: Chest Pain Patient Education (See Patient Education module for education specifics.) Outcome: Progressing As Educational Needs  Problem: Phase I Progression Outcomes Goal: Hemodynamically stable Outcome: Progressing VSS. C/O mild intermittent Left side chest pain that resolves on it's own. NTP 1 " applied.  Goal: Anginal pain relieved Outcome: Progressing C/O mild Intermittent chest pain. NTP ordered q 6 hours

## 2016-03-11 NOTE — Progress Notes (Signed)
03/11/2016 10:41 AM  Progress Note  Pt was seen and examined.  H&P, orders, labs reviewed.  Appreciate cardiology assistance.  Will follow closely.   Murvin Natal, MD

## 2016-03-11 NOTE — ED Provider Notes (Signed)
CSN: [id number]     Arrival date & time 03/11/16  0104 History  By signing my name below, I, Carrie [name], attest that this documentation has been prepared under the direction and in the presence of [name], MD.  Electronically Signed: [name], ED Scribe. 03/11/2016. 1:25 AM.  Chief Complaint  Patient presents with  . Chest Pain      The history is provided by the patient. No language interpreter was used.   HPI Comments: Carrie [name] is a 75 y.o. female brought in by ambulance, who has a PMHx of HTN, afib, HLD, pacemaker, CHF, GERD, migraines and stroke presents to the Emergency Department complaining of sudden onset, gradual worsening, moderate, 9/10, pressure-like, left sided chest pain onset about 1.5 hours ago. Pt says the pain radiates into her left arm. Pt notes associated diaphoresis and shortness of breath earlier with her chest pain. She notes she was laying down watching television when she felt sudden pain. She received nitro by EMS which she notes did not alleviate her pain. She says she has never had similar pain like this before. Pt states she has no stents placed but notes having a mitral valve placement. Pt denies leg swelling or any other pain.   Past Medical History  Diagnosis Date  . Hypertension   . Atrial fibrillation ([place])     normal coronaries cath 2004. Dr [facility]   . Sick sinus syndrome ([facility])     Dr Crissie Sickles. EP study negative. Pacemaker 12/15/06 Medtronic  . Hyperlipidemia   . Anxiety   . Diverticula of colon   . Hemorrhage intraabdominal 03/17/2012  . 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 Jude mechanical prosthesis. LA severe dilated  . Subdural hematoma ([place])   . Liver hemorrhage   . Atrial flutter ([place])   . Pacemaker     medtronic adapta  . Heart murmur   . CHF (congestive heart failure) ([place])   . Pneumonia 2009    resolved.? OPD Rx  . Exertional shortness of breath   . History of blood transfusion      "once" (03/15/2013)  . GERD (gastroesophageal reflux disease)   . Migraines   . Stroke [facility])     "they say I had a stroke last year" denies residual on 03/15/2013  . Arthritis     "right shoulder" (03/15/2013)  . Anticoagulated on Coumadin     for mech valve and atrial fib  goal 2.0-2.5   Past Surgical History  Procedure Laterality Date  . Mitral valve replacement  1998    St Jude mechanical; Dr. [name]  . Appendectomy    . Tubal ligation    . Tee without cardioversion  09/23/2011    Procedure: TRANSESOPHAGEAL ECHOCARDIOGRAM (TEE);  Surgeon: [name];  Location: [facility] ENDOSCOPY;  Service: Cardiovascular;  Laterality: N/A;  . Insert / replace / remove pacemaker  12/15/2006    Medtronic  . US echocardiography  11/19/2011    EF 50-55%,RA mod to severely dilated,LA severely dilated,trace MR,small vegetation or mass on the MV,AOV mildly scleroticmild PI, RV pressure 40-82mmHg  . Persantine cardiolite  08/07/03    mild inf. ischemia   . Tonsillectomy    . Exploratory laparotomy      "had a growth on my intestines" (03/15/2013)  . Cataract extraction w/ intraocular lens  implant, bilateral Bilateral 2013  . Dilation and curettage of uterus      "had fibroids" (03/15/2013)  . Cardiac catheterization  04/02/97    R&L:severe MR/pulmonary hypertension  . Hammer toe surgery Right   . Pacemaker placement  12/15/06    medtronic adapta for SSS  . Permanent pacemaker generator change N/A 07/24/2014    Procedure: PERMANENT PACEMAKER GENERATOR CHANGE;  Surgeon: [name], MD;  Location[facility];  Service: Cardiovascular;  Laterality: N/A;   Family History  Problem Relation Age of Onset  . Cancer Mother   . Stroke Father   . Cancer Sister   . Leukemia Sister   . Healthy Brother   . Healthy Sister   . Diabetes Sister   . Healthy Brother   . Healthy Brother   . Heart attack Maternal Grandfather   . Kidney disease Daughter   . Stroke Brother    Social History  Substance Use  Topics  . Smoking status: Never Smoker   . Smokeless tobacco: Never Used  . Alcohol Use: No   OB History    No data available     Review of Systems  Constitutional: Positive for diaphoresis.  Respiratory: Positive for shortness of breath.   Cardiovascular: Positive for chest pain. Negative for leg swelling.  All other systems reviewed and are negative.     Allergies  Codeine  Home Medications   Prior to Admission medications   Medication Sig Start Date End Date Taking? Authorizing Provider  Biotin 1000 MCG tablet Take 1,000 mcg by mouth daily.   Yes Historical Provider, MD  CARTIA XT 120 MG 24 hr capsule TAKE ONE CAPSULE BY MOUTH DAILY 02/24/16  Yes Mihai Croitoru, MD  chlorthalidone (HYGROTON) 25 MG tablet TAKE 1/2 TABLET BY MOUTH DAILY 02/24/16  Yes Mihai Croitoru, MD  furosemide (LASIX) 20 MG tablet TAKE 1 TABLET BY MOUTH DAILY 11/26/15  Yes Mihai Croitoru, MD  metoprolol tartrate (LOPRESSOR) 25 MG tablet Take 1 tablet (25 mg total) by mouth 2 (two) times daily. Patient taking differently: Take 12.5 mg by mouth 2 (two) times daily.  11/15/15  Yes Mihai Croitoru, MD  Multiple Vitamin (MULTIVITAMINS PO) Take 1 tablet by mouth daily.    Yes Historical Provider, MD  Omega-3 Fatty Acids (FISH OIL) 1000 MG CAPS Take 1,000 mg by mouth daily.   Yes Historical Provider, MD  polyethylene glycol (MIRALAX / GLYCOLAX) packet Take 17 g by mouth as needed (for constipation).    Yes Historical Provider, MD  potassium chloride SA (K-DUR,KLOR-CON) 20 MEQ tablet Take 1 tablet (20 mEq total) by mouth daily. 11/15/15  Yes Mihai Croitoru, MD  warfarin (COUMADIN) 5 MG tablet Take 2.5-5 mg by mouth daily. Take 1/2 tablet on Sunday then take 1 tablet all the other days   Yes Historical Provider, MD  warfarin (COUMADIN) 5 MG tablet TAKE 1 TABLET BY MOUTH DAILY AS DIRECTED Patient not taking: Reported on 03/11/2016 09/26/15   Mihai Croitoru, MD   BP 140/77 mmHg  Pulse 69  Temp(Src) 97.5 F (36.4 C) (Oral)   Resp 24  Ht 5\' 1"  (1.549 m)  Wt 143 lb (64.864 kg)  BMI 27.03 kg/m2  SpO2 99% Physical Exam  Constitutional: She is oriented to person, place, and time. No distress.  Elderly, no acute distress  HENT:  Head: Normocephalic and atraumatic.  Cardiovascular: Normal rate.   Murmur heard. Irregular rhythm  Pulmonary/Chest: Effort normal and breath sounds normal. No respiratory distress. She has no wheezes.  Pacer palpated left upper chest  Abdominal: Soft. Bowel sounds are normal. There is no tenderness. There is no rebound.  Musculoskeletal: She  exhibits no edema.  Neurological: She is alert and oriented to person, place, and time.  Skin: Skin is warm and dry.  Psychiatric: She has a normal mood and affect.  Nursing note and vitals reviewed.   ED Course  Procedures . DIAGNOSTIC STUDIES: Oxygen Saturation is 100% on RA, normal by my interpretation.  COORDINATION OF CARE:  1:23 AM Discussed treatment plan with pt at bedside and pt agreed to plan.  Labs Review Labs Reviewed  CBC WITH DIFFERENTIAL/PLATELET - Abnormal; Notable for the following:    RBC 3.62 (*)    Hemoglobin 11.0 (*)    HCT 32.8 (*)    RDW 19.8 (*)    All other components within normal limits  BASIC METABOLIC PANEL - Abnormal; Notable for the following:    Sodium 132 (*)    Potassium 3.4 (*)    Chloride 100 (*)    Glucose, Bld 131 (*)    GFR calc non Af Amer 60 (*)    All other components within normal limits  PROTIME-INR - Abnormal; Notable for the following:    Prothrombin Time 22.4 (*)    INR 1.98 (*)    All other components within normal limits  I-STAT TROPOININ, ED    Imaging Review Dg Chest 2 View  03/11/2016  CLINICAL DATA:  Acute onset of left-sided chest pain, radiating to the left arm. Initial encounter. EXAM: CHEST  2 VIEW COMPARISON:  Chest radiograph from 07/17/2013 FINDINGS: The lungs are well-aerated. Minimal bibasilar atelectasis or scarring is noted. Peribronchial thickening is seen.  There is no evidence of pleural effusion or pneumothorax. The heart is enlarged. The patient is status post median sternotomy. A valve replacement is noted. A pacemaker is noted at the left chest wall, with a single lead ending at the right ventricle. No acute osseous abnormalities are seen. IMPRESSION: 1. Minimal bibasilar atelectasis or scarring noted. Peribronchial thickening seen. Lungs otherwise grossly clear. 2. Cardiomegaly. Electronically Signed   By: Garald Balding M.D.   On: 03/11/2016 02:30   I have personally reviewed and evaluated these images and lab results as part of my medical decision-making.   EKG Interpretation   Date/Time:  Wednesday March 11 2016 01:15:00 EDT Ventricular Rate:  58 PR Interval:    QRS Duration: 155 QT Interval:  488 QTC Calculation: 500 R Axis:   -80 Text Interpretation:  Atrial fibrillation Left bundle branch block  Confirmed by Aleck Locklin  MD, Fidencio Duddy ([id number]) on 03/11/2016 1:18:38 AM      MDM   Final diagnoses:  Other chest pain    Patient presents with chest pain. Nontoxic-appearing on exam. She has multiple cardiac comorbidities. Last cardiac catheterization was in 2004 with clean coronaries. She has a mitral valve replacement on chronic Coumadin. She is nontoxic-appearing. No signs of volume overload. Lab work including troponin is reassuring. She does have cardiomegaly on chest x-ray without evidence of pulmonary edema. EKG shows atrial fibrillation with left bundle. No significant change from prior. Patient's pain improved with nitroglycerin paste and morphine. She is now chest pain is 0. Discussed with cardiology on call. Will admit to the hospitalist for chest pain rule out.  Consult cardiology if needed later in the day.  I personally performed the services described in this documentation, which was scribed in my presence. The recorded information has been reviewed and is accurate.   [name], MD 03/11/16 [phone]

## 2016-03-11 NOTE — Consult Note (Signed)
CARDIOLOGY CONSULT NOTE   Patient ID: ADLIH Carrie [name] MRN: [id number] DOB/AGE: 74-Dec-1942 74 y.o.  Admit date: 03/11/2016  Primary Physician   Elyn Peers, MD Primary Cardiologist    Dr. Sallyanne Kuster  Reason for Consultation   Elevated troponin Requesting Physician  Dr. Wynetta Emery  HPI: Carrie [name] is a 76 y.o. 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  In December 2012 she had subarachnoid hemorrhage and in June 2013 she had intrahepatic hemorrhage requiring arterial embolization and we are keeping her INR in the lower range (2.0-2.5). She has normal coronary arteries by previous angiography (2004) normal left ventricular systolic function by echocardiography (Oct 2015).  Had high ventricular pacing 10/2015 due to heart failure. After changing her dose of metoprolol she has a lower percentage of ventricular pacing. Interrogation of her Medtronic Sensia single chamber pacemaker during office visit with Dr. Sallyanne Kuster 02/11/16  shows normal device function with an estimated generator longevity of 6.5-9 years, good histograms in the absence of episodes of high ventricular rate. She has a lower percentage of ventricular pacing of 52% (at lower rate limit of 60 bpm). Since then she is doing well.  Last night while laying in bed she had a left-sided chest pressure that was radiated to her left arm. Associated with nausea and diaphoretic. Questionable shortness of breath. She rate  presser 9 out of 10 and EMS was called. No relief with sublingual nitroglycerin x 1.  Patient's pain improved with nitroglycerin paste and morphine in ER and eventually dissolved. Denies exertional chest pain or shortness of breath prior to this episode. Her weight has been stable 142 to 144 lb. compliant with diet and medication. Denies orthopnea, PND, syncope, melena or blood in her stool or urine.  K of 3.4. Poc troponin negative. Troponin I of 2.01. Hgb of 11. INR 1.98. Chest XRay unremarkable.  EKG showed afib with LBBB. No acute changes. Telemetry shows A. fib at ventricular rate of 50s to 60s, intermittent paced rhythm.  Past Medical History  Diagnosis Date  . Hypertension   . Atrial fibrillation ([place])     normal coronaries cath 2004. Dr Rex Kras [facility]   . Sick sinus syndrome (H[facility])     Dr Crissie Sickles. EP study negative. Pacemaker 12/15/06 Medtronic  . Hyperlipidemia   . Anxiety   . Diverticula of colon   . Hemorrhage intraabdominal 03/17/2012  . 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 Jude mechanical prosthesis. LA severe dilated  . Subdural hematoma ([place])   . Liver hemorrhage   . Atrial flutter ([place])   . Pacemaker     medtronic adapta  . Heart murmur   . CHF (congestive heart failure) ([place])   . Pneumonia 2009    resolved.? OPD Rx  . Exertional shortness of breath   . History of blood transfusion     "once" (03/15/2013)  . GERD (gastroesophageal reflux disease)   . Migraines   . Stroke [facility])     "they say I had a stroke last year" denies residual on 03/15/2013  . Arthritis     "right shoulder" (03/15/2013)  . Anticoagulated on Coumadin  for mech valve and atrial fib  goal 2.0-2.5     Past Surgical History  Procedure Laterality Date  . Mitral valve replacement  1998    St Jude mechanical; Dr. [name]  . Appendectomy    . Tubal ligation    . Tee without cardioversion  09/23/2011    Procedure: TRANSESOPHAGEAL ECHOCARDIOGRAM (TEE);  Surgeon: [name];  Location: [facility] ENDOSCOPY;  Service: Cardiovascular;  Laterality: N/A;  .  Insert / replace / remove pacemaker  12/15/2006    Medtronic  . US echocardiography  11/19/2011    EF 50-55%,RA mod to severely dilated,LA severely dilated,trace MR,small vegetation or mass on the MV,AOV mildly scleroticmild PI, RV pressure 40-59mmHg  . Persantine cardiolite  08/07/03    mild inf. ischemia   . Tonsillectomy    . Exploratory laparotomy      "had a growth on my intestines" (03/15/2013)  . Cataract extraction w/ intraocular lens  implant, bilateral Bilateral 2013  . Dilation and curettage of uterus      "had fibroids" (03/15/2013)  . Cardiac catheterization  04/02/97    R&L:severe MR/pulmonary hypertension  . Hammer toe surgery Right   . Pacemaker placement  12/15/06    medtronic adapta for SSS  . Permanent pacemaker generator change N/A 07/24/2014    Procedure: PERMANENT PACEMAKER GENERATOR CHANGE;  Surgeon: [name], MD;  Location: [place] CATH LAB;  Service: Cardiovascular;  Laterality: N/A;    Allergies  Allergen Reactions  . Codeine Nausea And Vomiting    I have reviewed the patient's current medications . chlorthalidone  12.5 mg Oral Daily  . warfarin  6 mg Oral ONCE-1800  . Warfarin - Pharmacist Dosing Inpatient   Does not apply q1800     acetaminophen, gi cocktail, morphine injection, ondansetron (ZOFRAN) IV  Prior to Admission medications   Medication Sig Start Date End Date Taking? Authorizing Provider  Biotin 1000 MCG tablet Take 1,000 mcg by mouth daily.   Yes Historical Provider, MD  CARTIA XT 120 MG 24 hr capsule TAKE ONE CAPSULE BY MOUTH DAILY 02/24/16  Yes Mihai Croitoru, MD  chlorthalidone (HYGROTON) 25 MG tablet TAKE 1/2 TABLET BY MOUTH DAILY 02/24/16  Yes Mihai Croitoru, MD  furosemide (LASIX) 20 MG tablet TAKE 1 TABLET BY MOUTH DAILY 11/26/15  Yes Mihai Croitoru, MD  metoprolol tartrate (LOPRESSOR) 25 MG tablet Take 1 tablet (25 mg total) by mouth 2 (two) times daily. Patient taking differently: Take 12.5 mg by mouth 2 (two) times daily.  11/15/15  Yes  Mihai Croitoru, MD  Multiple Vitamin (MULTIVITAMINS PO) Take 1 tablet by mouth daily.    Yes Historical Provider, MD  Omega-3 Fatty Acids (FISH OIL) 1000 MG CAPS Take 1,000 mg by mouth daily.   Yes Historical Provider, MD  polyethylene glycol (MIRALAX / GLYCOLAX) packet Take 17 g by mouth as needed (for constipation).    Yes Historical Provider, MD  potassium chloride SA (K-DUR,KLOR-CON) 20 MEQ tablet Take 1 tablet (20 mEq total) by mouth daily. 11/15/15  Yes Mihai Croitoru, MD  warfarin (COUMADIN) 5 MG tablet Take 2.5-5 mg by mouth daily. Take 1/2 tablet on Sunday then take 1 tablet all the other days   Yes Historical Provider, MD  warfarin (COUMADIN) 5 MG tablet TAKE 1 TABLET BY MOUTH DAILY AS DIRECTED Patient not taking: Reported on 03/11/2016 09/26/15   [name], MD     Social History   Social History  . Marital Status: Married    Spouse Name: N/A  .  Number of Children: N/A  . Years of Education: N/A   Occupational History  . Not on file.   Social History Main Topics  . Smoking status: Never Smoker   . Smokeless tobacco: Never Used  . Alcohol Use: No  . Drug Use: No  . Sexual Activity: No   Other Topics Concern  . Not on file   Social History Narrative    Family Status  Relation Status Death Age  . Mother Deceased   . Father Deceased   . Sister Deceased   . Brother Alive   . Sister Alive   . Sister Alive   . Brother Alive   . Brother Alive    Family History  Problem Relation Age of Onset  . Cancer Mother   . Stroke Father   . Cancer Sister   . Leukemia Sister   . Healthy Brother   . Healthy Sister   . Diabetes Sister   . Healthy Brother   . Healthy Brother   . Heart attack Maternal Grandfather   . Kidney disease Daughter   . Stroke Brother       ROS:  Full 14 point review of systems complete and found to be negative unless listed above.  Physical Exam: Blood pressure 141/86, pulse 62, temperature 97.9 F (36.6 C), temperature source Oral, resp.  rate 16, height 5\' 1"  (1.549 m), weight 145 lb 8 oz (65.998 kg), SpO2 100 %.  General: Well developed, well nourished, female in no acute distress Head: Eyes PERRLA, No xanthomas. Normocephalic and atraumatic, oropharynx without edema or exudate.  Lungs: Resp regular and unlabored, CTA. Heart: Irregular no s3, s4, cliks with murmur Neck: No carotid bruits. No lymphadenopathy.  + JVD. Abdomen: Bowel sounds present, abdomen soft and non-tender without masses or hernias noted. Msk:  No spine or cva tenderness. No weakness, no joint deformities or effusions. Extremities: No clubbing, cyanosis or edema. DP/PT/Radials 2+ and equal bilaterally. Neuro: Alert and oriented X 3. No focal deficits noted. Psych:  Good affect, responds appropriately Skin: No rashes or lesions noted.  Labs:   Lab Results  Component Value Date   WBC 5.9 03/11/2016   HGB 11.0* 03/11/2016   HCT 32.8* 03/11/2016   MCV 90.6 03/11/2016   PLT 203 03/11/2016    Recent Labs  03/11/16 0503  INR 1.98*    Recent Labs Lab 03/11/16 0238  NA 132*  K 3.4*  CL 100*  CO2 23  BUN 14  CREATININE 0.92  CALCIUM 9.8  GLUCOSE 131*   MAGNESIUM  Date Value Ref Range Status  03/22/2012 1.8 1.5 - 2.5 mg/dL Final    Recent Labs  03/11/16 0747  TROPONINI 2.01*    Recent Labs  03/11/16 0307  TROPIPOC 0.00   Echo: 08/2015 LV EF: 55% - 60%  ------------------------------------------------------------------- Indications: S/p MVR (Z95.4).  ------------------------------------------------------------------- History: PMH: Atrial fibrillation. Endocarditis. Risk factors: Sick sinus syndrome. Tachycardia. Thrombocytopenia. Hypertension.  ------------------------------------------------------------------- Study Conclusions  - Left ventricle: The cavity size was normal. Wall thickness was  normal. Systolic function was normal. The estimated ejection  fraction was in the range of 55% to 60%. Wall  motion was normal;  there were no regional wall motion abnormalities. The study is  not technically sufficient to allow evaluation of LV diastolic  function. - Aortic valve: Trileaflet. Sclerosis without stenosis. There was  trivial regurgitation. - Mitral valve: Mechanical tilting bileaflet valve. Normal motion.  Appropriate gradients without obstruction. Mean gradient (D): 7  mm Hg. Valve  area by continuity equation (using LVOT flow): 1.49  cm^2. - Left atrium: Severely dilated at 46 cm2. - Right ventricle: The cavity size was moderately dilated. Pacer  wire or catheter noted in right ventricle. - Right atrium: Severely dilated at 42 cm2. Pacer wire or catheter  noted in right atrium. - Tricuspid valve: There was moderate to severe regurgitation. - Pulmonic valve: There was mild regurgitation. - Pulmonary arteries: PA peak pressure: 26 mm Hg (S). - Systemic veins: The IVC measures >2.1 cm, but collapses >50%,  suggesting an elevated RA pressure of 8 mmHg.  Impressions:  - Compared to a prior study in 2015, there are few changes. There  is now moderate to severe TR. The mechanical mitral valve is not  obstructed.  ECG: Afib with LBBB. No acute changes.   Cath 08/2003 PRESSURES: LV 130/0. LVEDP 18 mmHg.  CA 130/63 mmHg.  There is no gradient across the aortic valve on catheter pullback.  There is no AI with hand injection of the aortic root which appeared normal.  LV angiogram shows a normally contracting LV with EF greater than 55%, no segmental wall motion abnormality, and no MR present. The mitral valve ring to the St. Jude valve was easily visualized and the valve appeared to open normally fluoroscopically.  Fluoroscopy did not show any significant coronary, intracardiac, or valvular calcification.  The main left coronary was normal. There was a small optional diagonal branch that was normal.  The circumflex artery was of  moderate size, nondominant, bifurcated distally, and gave off a moderate sized PABG branch, all of which were normal.  The LAD was tortuous, coursed to the apex of the heart where it bifurcated, then was normal. There was a large DX1 after SP1 that was normal and it bifurcated distally.  The right coronary was dominant and normal with normal PDA/PLA, RV branches and it was mildly tortuous.  DISCUSSION: History as outlined above. The patient has normal LV function, no evidence of aortic valve disease, and presumed normal St. Jude mitral valve prosthesis without MR in this study. Coronary arteries remain normal.  She has mild elevation of LFTs which could be related to Vytorin administration or could be related to her chronic amiodarone administration. She is scheduled for EPS evaluation with Dr. Lovena Le and possible ablation if this represents SVT (atrial flutter with aberration). The etiology of her syncope could be tachyarrhythmic and/or bradyarrhythmic from after conversion based on her history and available data.  CATHETERIZATION DIAGNOSES: 1. Normal coronary arteries. 2. Normal left ventricle. 3. No aortic valve gradient or aortic insufficiency. 4. Syncope with nonsustained WCT on Holter monitor, recurrent episodes of  presyncope and syncope, and palpitations. 5. Status post mitral valve replacement functioning normally, 1998. 6. Hyperlipidemia.    Radiology:  Dg Chest 2 View  03/11/2016  CLINICAL DATA:  Acute onset of left-sided chest pain, radiating to the left arm. Initial encounter. EXAM: CHEST  2 VIEW COMPARISON:  Chest radiograph from 07/17/2013 FINDINGS: The lungs are well-aerated. Minimal bibasilar atelectasis or scarring is noted. Peribronchial thickening is seen. There is no evidence of pleural effusion or pneumothorax. The heart is enlarged. The patient is status post median sternotomy. A valve replacement is noted. A pacemaker is  noted at the left chest wall, with a single lead ending at the right ventricle. No acute osseous abnormalities are seen. IMPRESSION: 1. Minimal bibasilar atelectasis or scarring noted. Peribronchial thickening seen. Lungs otherwise grossly clear. 2. Cardiomegaly. Electronically Signed   By: Garald Balding  M.D.   On: 03/11/2016 02:30    ASSESSMENT AND PLAN:     1. NSTEMI - Her chest pain is concerning for unstable angina. Initial troponin  was negative. Last reading 2.1. Cath with normal coronaries 2004. Currently chest pain free. Continue cycle troponin. She can eat today. Nothing by mouth after midnight. Coumadin to heparin per pharmacy. Will plan for cath once INR below 1.7. Very difficult to situation with need of anticoagulation and patient had a prior intracranial bleeding. If she had a coronary disease she will benefit from a bare-metal stent versus freedom 2 trial.  - Start lipitor. Check lipid panel.   2. Chronic diastolic CHF - weight has been stable at 142-144lb. NO LE edema and lungs are clear.  3. Moderate to severe TR - likely contributing to JVD.   4. Chronic anticoagulation, ( INR goal 2.0-2.5 due to history of subdural hematoma and liver hemorrhage) - INR of 1.98 today  5. Atrial fibrillation with slow ventricular response ([place]) - Plan for rate control. CHADSVASCs score of 6. On coumadin for anticoagulation. Rate in 50-60s. As above.   6. SSS s/p PPM - Device check was normal 02/11/16.   7. HTN - stable. Held dilt and BB. Will resume home meds.   8. Hypokalemia - supplement given    Signed: Bhagat,Bhavinkumar, PA 03/11/2016, 9:57 AM Pager [phone]  Co-Sign MD Patient seen and examined and history reviewed. Agree with above findings and plan. 75 yo BF with history of rheumatic heart disease s/p mechanical MVR in 1998 with St Jude prosthesis. Chronic afib. On chronic anticoagulation. Patient's coumadin has been kept in lower therapeutic range due to history of  subarachnoid hemorrhage in 2012 and intrahepatic hemorrhage in 2013. She presents now with protracted left precordial chest pain radiating to left shoulder. Initial POC troponin 0. Repeat troponin I 4 hours later 2.01. Ecg uninterpretible for ischemia due to pacemaker and LBBB.  She denies any increased dyspnea or edema. Weight has been stable.  Exam reveals patient in no distress Good mechanical MV click. Gr [id number] systolic murmur at apex. No edema.  Impression: NSTEMI. Clinical history c/w ACS. Last cath 2004 was negative but she has risk factors of HTN and HL. Could possibly be embolic with Afib and mechanical MV. Will trend cardiac enzymes. Check Echo today.  Recommend cardiac cath this admission to evaluate. Will hold Coumadin and start IV heparin. When INR <1.8 could proceed with cath. If PCI indicated would need to take in consideration history of significant bleeding. She may be a candidate for Freedom trial.   Ameilia Rattan [place], [place] 03/11/2016 11:08 AM

## 2016-03-11 NOTE — Progress Notes (Signed)
Ideal for heparin/warfarin Indication: atrial fibrillation and MVR and ACS/STEMI  Allergies  Allergen Reactions  . Codeine Nausea And Vomiting    Patient Measurements: Height: 5\' 1"  (154.9 cm) Weight: 145 lb 8 oz (65.998 kg) IBW/kg (Calculated) : 47.8 Heparin Dosing Weight: 61.6 kg  Vital Signs: Temp: 98.4 F (36.9 C) (06/21 1957) Temp Source: Oral (06/21 1957) BP: 124/71 mmHg (06/21 1957) Pulse Rate: 100 (06/21 1957)  Labs:  Recent Labs  03/11/16 0210 03/11/16 0238 03/11/16 0503 03/11/16 0747 03/11/16 1226 03/11/16 1953  HGB 11.0*  --   --   --   --   --   HCT 32.8*  --   --   --   --   --   PLT 203  --   --   --   --   --   LABPROT  --   --  22.4*  --   --   --   INR  --   --  1.98*  --   --   --   HEPARINUNFRC  --   --   --   --   --  0.49  CREATININE  --  0.92  --   --   --   --   TROPONINI  --   --   --  2.01* 5.32*  --     Estimated Creatinine Clearance: 46.7 mL/min (by C-G formula based on Cr of 0.92).   Medical History: Past Medical History  Diagnosis Date  . Hypertension   . Atrial fibrillation ([place])     normal coronaries cath 2004. Dr Rex Kras [facility]   . Sick sinus syndrome ([facility])     Dr Crissie Sickles. EP study negative. Pacemaker 12/15/06 Medtronic  . Hyperlipidemia   . Anxiety   . Diverticula of colon   . Hemorrhage intraabdominal 03/17/2012  . 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 Jude mechanical prosthesis. LA severe dilated  . Subdural hematoma ([place])   . Liver hemorrhage   . Atrial flutter ([place])   . Pacemaker     medtronic adapta  . Heart murmur   . CHF (congestive heart failure) ([place])   . Pneumonia 2009    resolved.? OPD Rx  . Exertional shortness of breath   . History of blood transfusion     "once" (03/15/2013)  . GERD (gastroesophageal reflux disease)   . Migraines   . Stroke [facility])     "they say I had a stroke last year" denies residual on 03/15/2013  . Arthritis    "right shoulder" (03/15/2013)  . Anticoagulated on Coumadin     for mech valve and atrial fib  goal 2.0-2.5    Medications:  Prescriptions prior to admission  Medication Sig Dispense Refill Last Dose  . Biotin 1000 MCG tablet Take 1,000 mcg by mouth daily.   03/10/2016 at Unknown time  . CARTIA XT 120 MG 24 hr capsule TAKE ONE CAPSULE BY MOUTH DAILY 90 capsule 3 03/10/2016 at Unknown time  . chlorthalidone (HYGROTON) 25 MG tablet TAKE 1/2 TABLET BY MOUTH DAILY 15 tablet 8 03/10/2016 at Unknown time  . furosemide (LASIX) 20 MG tablet TAKE 1 TABLET BY MOUTH DAILY 90 tablet 0 03/10/2016 at Unknown time  . metoprolol tartrate (LOPRESSOR) 25 MG tablet Take 1 tablet (25 mg total) by mouth 2 (two) times daily. (Patient taking differently: Take 12.5 mg by mouth 2 (two) times daily. ) 180 tablet 3 03/10/2016 at  1800  . Multiple Vitamin (MULTIVITAMINS PO) Take 1 tablet by mouth daily.    03/10/2016 at Unknown time  . Omega-3 Fatty Acids (FISH OIL) 1000 MG CAPS Take 1,000 mg by mouth daily.   03/10/2016 at Unknown time  . polyethylene glycol (MIRALAX / GLYCOLAX) packet Take 17 g by mouth as needed (for constipation).    03/10/2016 at Unknown time  . potassium chloride SA (K-DUR,KLOR-CON) 20 MEQ tablet Take 1 tablet (20 mEq total) by mouth daily. 30 tablet 5 03/10/2016 at Unknown time  . warfarin (COUMADIN) 5 MG tablet Take 2.5-5 mg by mouth daily. Take 1/2 tablet on Sunday then take 1 tablet all the other days   03/10/2016 at 1800  . warfarin (COUMADIN) 5 MG tablet TAKE 1 TABLET BY MOUTH DAILY AS DIRECTED (Patient not taking: Reported on 03/11/2016) 90 tablet 2 Not Taking at Unknown time   Scheduled:  . [START ON 03/12/2016] aspirin  81 mg Oral Pre-Cath  . atorvastatin  80 mg Oral q1800  . chlorthalidone  12.5 mg Oral Daily  . diltiazem  120 mg Oral Daily  . furosemide  20 mg Oral Daily  . metoprolol tartrate  12.5 mg Oral BID  . nitroGLYCERIN  1 inch Topical Q6H  . potassium chloride  30 mEq Oral BID  . sodium  chloride flush  3 mL Intravenous Q12H  . Warfarin - Pharmacist Dosing Inpatient   Does not apply q1800    Assessment: 88 yoF c/o CP radiating to LUE associated w/ diaphoresis and SOB, troponin now positive, to start heparin and hold warfarin with possible plan for cath.  Pt on chronic warfarin for Afib/MVR; of note pt has low INR goal (2.0-2.5) due to history of subdural hematoma and liver hemorrhage; last dose of Coumadin taken 6/20, current INR slightly below goal. PTA warfarin dose: 2.5 mg on Sundays, and 5 mg all other days  PM heparin level therapeutic at 0.49   Goal of Therapy:  INR 2.0-2.5 Heparin level 0.3-0.7 units/ml Monitor platelets by anticoagulation protocol: Yes   Plan:  Continue heparin at 900 units / hr Follow up AM labs and plans for cath  Thank you Anette Guarneri, PharmD [phone] 03/11/2016,8:43 PM

## 2016-03-11 NOTE — H&P (Signed)
History and Physical  Patient Name: Carrie [name]     [id number]    DOB: [birth year]    DOA: 03/11/2016 PCP: Elyn Peers, MD   Patient coming from: Home     Chief Complaint: Chest pain  HPI: Carrie [name] is a 75 y.o. 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  The patient was in her normal health until tonight at midnight while watching TV when she had onset of left sided chest pressure.  The pain was located in the left chest, radiating to the left arm.  It was associated with nausea and "feeling very uncomfortable".  It was constant and pressure like and unlike any sensation she had had before.  Her husband called 9-1-1 and she was transported here.  ED course: -Afebrile, HR 54, BP 152/73, saturating well on 2L [place] -Initial ECG showed irregular rhythm with nonspecific IVCD, similar to previous, and troponin was negative. -Na 132, K 3.4, Cr 0.92 (baseline), WBC 5.9, Hgb 11 -She was given nitro paste and her pain subsided over the course of an hour -TRH was asked to admit for observation, serial troponins and risk stratification.     Review of Systems:  All other systems negative except as just noted or noted in the history of present illness.  Past Medical History  Diagnosis Date  . Hypertension   . Atrial fibrillation ([place])     normal coronaries cath 2004. Dr Rex [facility]   . Sick sinus syndrome (HCC)     Dr Crissie Sickles. EP study negative. Pacemaker 12/15/06 Medtronic  . Hyperlipidemia   . Anxiety   . Diverticula of colon   . Hemorrhage intraabdominal 03/17/2012  . 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 Jude mechanical prosthesis. LA severe dilated  . Subdural hematoma ([place])   . Liver hemorrhage   . Atrial flutter ([place])   . Pacemaker     medtronic adapta  . Heart murmur   . CHF (congestive heart  failure) ([place])   . Pneumonia 2009    resolved.? OPD Rx  . Exertional shortness of breath   . History of blood transfusion     "once" (03/15/2013)  . GERD (gastroesophageal reflux disease)   . Migraines   . Stroke [facility])     "they say I had a stroke last year" denies residual on 03/15/2013  . Arthritis     "right shoulder" (03/15/2013)  . Anticoagulated on Coumadin     for mech valve and atrial fib  goal 2.0-2.5    Past Surgical History  Procedure Laterality Date  . Mitral valve replacement  1998    St Jude mechanical; Dr. [name]  . Appendectomy    . Tubal ligation    . Tee without cardioversion  09/23/2011    Procedure: TRANSESOPHAGEAL ECHOCARDIOGRAM (TEE);  Surgeon: [name];  Location: [facility] ENDOSCOPY;  Service: Cardiovascular;  Laterality: N/A;  . Insert / replace / remove pacemaker  12/15/2006    Medtronic  . US echocardiography  11/19/2011    EF 50-55%,RA mod to severely dilated,LA severely dilated,trace MR,small vegetation or mass on the MV,AOV mildly scleroticmild PI, RV pressure 40-7mmHg  . Persantine cardiolite  08/07/03    mild inf. ischemia   . Tonsillectomy    . Exploratory laparotomy      "had a growth on my  intestines" (03/15/2013)  . Cataract extraction w/ intraocular lens  implant, bilateral Bilateral 2013  . Dilation and curettage of uterus      "had fibroids" (03/15/2013)  . Cardiac catheterization  04/02/97    R&L:severe MR/pulmonary hypertension  . Hammer toe surgery Right   . Pacemaker placement  12/15/06    medtronic adapta for SSS  . Permanent pacemaker generator change N/A 07/24/2014    Procedure: PERMANENT PACEMAKER GENERATOR CHANGE;  Surgeon: [name], MD;  Location: [facility] CATH LAB;  Service: Cardiovascular;  Laterality: N/A;    Social History: Patient lives with her husband.  Patient walks unassisted.  She worked for [employer].  She is from [place], lived most of her life here though.  She never smoked.  She has no dementia.  Allergies    Allergen Reactions  . Codeine Nausea And Vomiting    Family history: family history includes Cancer in her mother and sister; Diabetes in her sister; Healthy in her brother, brother, brother, and sister; Heart attack in her maternal grandfather; Kidney disease in her daughter; Leukemia in her sister; Stroke in her brother and father.  Prior to Admission medications   Medication Sig Start Date End Date Taking? Authorizing Provider  Biotin 1000 MCG tablet Take 1,000 mcg by mouth daily.   Yes Historical Provider, MD  CARTIA XT 120 MG 24 hr capsule TAKE ONE CAPSULE BY MOUTH DAILY 02/24/16  Yes Mihai Croitoru, MD  chlorthalidone (HYGROTON) 25 MG tablet TAKE 1/2 TABLET BY MOUTH DAILY 02/24/16  Yes Mihai Croitoru, MD  furosemide (LASIX) 20 MG tablet TAKE 1 TABLET BY MOUTH DAILY 11/26/15  Yes Mihai Croitoru, MD  metoprolol tartrate (LOPRESSOR) 25 MG tablet Take 1 tablet (25 mg total) by mouth 2 (two) times daily. Patient taking differently: Take 12.5 mg by mouth 2 (two) times daily.  11/15/15  Yes Mihai Croitoru, MD  Multiple Vitamin (MULTIVITAMINS PO) Take 1 tablet by mouth daily.    Yes Historical Provider, MD  Omega-3 Fatty Acids (FISH OIL) 1000 MG CAPS Take 1,000 mg by mouth daily.   Yes Historical Provider, MD  polyethylene glycol (MIRALAX / GLYCOLAX) packet Take 17 g by mouth as needed (for constipation).    Yes Historical Provider, MD  potassium chloride SA (K-DUR,KLOR-CON) 20 MEQ tablet Take 1 tablet (20 mEq total) by mouth daily. 11/15/15  Yes Mihai Croitoru, MD  warfarin (COUMADIN) 5 MG tablet Take 2.5-5 mg by mouth daily. Take 1/2 tablet on Sunday then take 1 tablet all the other days   Yes Historical Provider, MD  warfarin (COUMADIN) 5 MG tablet TAKE 1 TABLET BY MOUTH DAILY AS DIRECTED Patient not taking: Reported on 03/11/2016 09/26/15   [name], MD       Physical Exam: BP 124/66 mmHg  Pulse 62  Temp(Src) 97.5 F (36.4 C) (Oral)  Resp 15  Ht 5\' 1"  (1.549 m)  Wt 64.864 kg (143 lb)   BMI 27.03 kg/m2  SpO2 94% General appearance: Well-developed, elderly adult female, alert and in no acute distress.  Hair curlers in place. Eyes: Anicteric, conjunctiva pink, lids and lashes normal.     ENT: No nasal deformity, discharge, or epistaxis.  OP moist without lesions.   Skin: Warm and dry.   Cardiac: RRR, nl S1-S2, no murmurs appreciated, mechanical sounding valve.  Capillary refill is brisk.  JVP very prominent.  Trace LE edema.  Radial and DP pulses 2+ and symmetric.  No carotid bruits. Respiratory: Normal respiratory rate and rhythm.  CTAB  without rales or wheezes. Abdomen: Abdomen soft without rigidity.  No TTP. No ascites, distension.   MSK: No deformities or effusions.   Pain not reproduced with palpation of precordium.  No pain with arm movement. Neuro: Sensorium intact and responding to questions, attention normal.  Speech is fluent.  Moves all extremities equally and with normal coordination.    Psych: Behavior appropriate.  Affect normal.  No evidence of aural or visual hallucinations or delusions.       Labs on Admission:  The metabolic panel shows hyponatremia, hypokalemia. The complete blood count shows mild anemia. The initial troponin is negative.  Radiological Exams on Admission: Personally reviewed: Dg Chest 2 View  03/11/2016  CLINICAL DATA:  Acute onset of left-sided chest pain, radiating to the left arm. Initial encounter. EXAM: CHEST  2 VIEW COMPARISON:  Chest radiograph from 07/17/2013 FINDINGS: The lungs are well-aerated. Minimal bibasilar atelectasis or scarring is noted. Peribronchial thickening is seen. There is no evidence of pleural effusion or pneumothorax. The heart is enlarged. The patient is status post median sternotomy. A valve replacement is noted. A pacemaker is noted at the left chest wall, with a single lead ending at the right ventricle. No acute osseous abnormalities are seen. IMPRESSION: 1. Minimal bibasilar atelectasis or scarring noted.  Peribronchial thickening seen. Lungs otherwise grossly clear. 2. Cardiomegaly. Electronically Signed   By: Garald Balding M.D.   On: 03/11/2016 02:30    EKG: Independently reviewed. Rate 58, irregular, nonspecific IVCD, I suspect pacer spikes are there, but they are not as prominent as in earlier tracings.    Assessment/Plan 1. Chest pain: This is new.  The patient has HEART score of 6. Angina is considered possible.  Other potential causes of chest pain (PE, dissection, pancreatitis, pneumonia/effusion, pericarditis) are doubted.  We have been asked to admit the patient for observation and etiology consultation with Cardiology tomorrow.  -Serial troponins are ordered -Telemetry -Consult to cardiology, appreciate recommendations   2. HTN:  -Continue chlorthalidone -Hold dilt and metop  3. Afib and SSS with pacer:  CHADS2Vasc 3.  On warfarin, INR goal 2.0-2.5 given hx of SAH. -Hold diltiazem and metoprolol in case stress test -Continue warfarin  4. Chronic diastolic CHF:  -Hold furosemide  5. Anemia:  This and thrombocytopenia have been chronic for patient.  6. Hypokalemia: -Replete K >4    DVT prophylaxis: On warfarin Diet: NPO for possible stress testing Code Status: Full  Family Communication: None present  Disposition Plan: Anticipate overnight observation for arrhythmia on telemetry, serial troponins and subsequent risk stratification by Cardiology.  If testing negative, home after. Consults called: Cardiology messaged via electronic message Admission status: Telemetry, observation   Medical decision making: Patient seen at 6:44 AM on 03/11/2016.  The patient was discussed with Dr. Dina Rich. What exists of the patient's chart was reviewed in depth.  Clinical condition: stable.      Edwin Dada [facility] Pager [phone]

## 2016-03-11 NOTE — Progress Notes (Signed)
Troponin increased further to 5.32. Coumadin to heparin per pharmacy. INR of 1.98 today needs less than 1.8 for cath. Will tentatively plan cath for tomorrow at 10:30am. KVO fluid.   Carrie [name], [place]

## 2016-03-12 ENCOUNTER — Other Ambulatory Visit: Payer: Self-pay

## 2016-03-12 DIAGNOSIS — I071 Rheumatic tricuspid insufficiency: Secondary | ICD-10-CM

## 2016-03-12 DIAGNOSIS — E871 Hypo-osmolality and hyponatremia: Secondary | ICD-10-CM | POA: Diagnosis present

## 2016-03-12 LAB — COMPREHENSIVE METABOLIC PANEL
ALBUMIN: 3.6 g/dL (ref 3.5–5.0)
ALK PHOS: 115 U/L (ref 38–126)
ALT: 23 U/L (ref 14–54)
ANION GAP: 8 (ref 5–15)
AST: 121 U/L — ABNORMAL HIGH (ref 15–41)
BUN: 14 mg/dL (ref 6–20)
CALCIUM: 9.7 mg/dL (ref 8.9–10.3)
CHLORIDE: 100 mmol/L — AB (ref 101–111)
CO2: 24 mmol/L (ref 22–32)
Creatinine, Ser: 1.11 mg/dL — ABNORMAL HIGH (ref 0.44–1.00)
GFR calc non Af Amer: 48 mL/min — ABNORMAL LOW (ref >60)
GFR, EST AFRICAN AMERICAN: 55 mL/min — AB (ref >60)
GLUCOSE: 105 mg/dL — AB (ref 65–99)
POTASSIUM: 4.4 mmol/L (ref 3.5–5.1)
SODIUM: 132 mmol/L — AB (ref 135–145)
Total Bilirubin: 0.8 mg/dL (ref 0.3–1.2)
Total Protein: 7.7 g/dL (ref 6.5–8.1)

## 2016-03-12 LAB — PROTIME-INR
INR: 2.3 — AB (ref 0.00–1.49)
INR: 2.14 — ABNORMAL HIGH (ref 0.00–1.49)
INR: 2.05 — ABNORMAL HIGH (ref 0.00–1.49)
PROTHROMBIN TIME: 25.1 s — AB (ref 11.6–15.2)
PROTHROMBIN TIME: 22.9 s — AB (ref 11.6–15.2)
Prothrombin Time: 23.7 seconds — ABNORMAL HIGH (ref 11.6–15.2)

## 2016-03-12 LAB — LIPID PANEL
CHOL/HDL RATIO: 2.9 ratio
Cholesterol: 144 mg/dL (ref 0–200)
HDL: 49 mg/dL (ref >40)
LDL CALC: 88 mg/dL (ref 0–99)
TRIGLYCERIDES: 35 mg/dL (ref <150)
VLDL: 7 mg/dL (ref 0–40)

## 2016-03-12 LAB — CBC
HCT: 30.1 % — ABNORMAL LOW (ref 36.0–46.0)
HEMOGLOBIN: 9.9 g/dL — AB (ref 12.0–15.0)
MCH: 30.2 pg (ref 26.0–34.0)
MCHC: 32.9 g/dL (ref 30.0–36.0)
MCV: 91.8 fL (ref 78.0–100.0)
PLATELETS: 213 10*3/uL (ref 150–400)
RBC: 3.28 MIL/uL — ABNORMAL LOW (ref 3.87–5.11)
RDW: 19 % — AB (ref 11.5–15.5)
WBC: 8.1 10*3/uL (ref 4.0–10.5)

## 2016-03-12 LAB — HEPARIN LEVEL (UNFRACTIONATED): HEPARIN UNFRACTIONATED: 0.92 [IU]/mL — AB (ref 0.30–0.70)

## 2016-03-12 MED ORDER — PHYTONADIONE 1 MG/0.5 ML ORAL SOLUTION: 1.0000 mg | Freq: Once | ORAL | Status: DC

## 2016-03-12 MED ORDER — PANTOPRAZOLE SODIUM 40 MG PO TBEC
40.0000 mg | DELAYED_RELEASE_TABLET | Freq: Every day | ORAL | Status: DC
  Administered 2016-03-12 – 2016-03-23 (×12): 40 mg via ORAL
  Filled 2016-03-12 (×12): qty 1

## 2016-03-12 MED ORDER — HEPARIN (PORCINE) IN NACL 100-0.45 UNIT/ML-% IJ SOLN
800.0000 [IU]/h | INTRAMUSCULAR | Status: DC
  Administered 2016-03-12: 800 [IU]/h via INTRAVENOUS
  Filled 2016-03-12: qty 250

## 2016-03-12 MED ORDER — PHYTONADIONE 1 MG/0.5 ML ORAL SOLUTION
1.0000 mg | Freq: Once | ORAL | Status: AC
  Administered 2016-03-12: 1 mg via ORAL
  Filled 2016-03-12: qty 0.5

## 2016-03-12 NOTE — Progress Notes (Signed)
ANTICOAGULATION CONSULT NOTE - Follow Up Consult  Pharmacy Consult for Heparin  Indication: chest pain/ACS and atrial fibrillation, MVR  Allergies  Allergen Reactions  . Codeine Nausea And Vomiting    Patient Measurements: Height: 5\' 1"  (154.9 cm) Weight: 143 lb 1.3 oz (64.9 kg) IBW/kg (Calculated) : 47.8  Vital Signs: Temp: 98.5 F (36.9 C) (06/22 0430) Temp Source: Oral (06/22 0430) BP: 111/61 mmHg (06/22 0430) Pulse Rate: 85 (06/22 0430)  Labs:  Recent Labs  03/11/16 0210 03/11/16 0238 03/11/16 0503 03/11/16 0747 03/11/16 1226 03/11/16 1953 03/12/16 0430  HGB 11.0*  --   --   --   --   --  9.9*  HCT 32.8*  --   --   --   --   --  30.1*  PLT 203  --   --   --   --   --  213  LABPROT  --   --  22.4*  --   --   --  25.1*  INR  --   --  1.98*  --   --   --  2.30*  HEPARINUNFRC  --   --   --   --   --  0.49 0.92*  CREATININE  --  0.92  --   --   --   --   --   TROPONINI  --   --   --  2.01* 5.32*  --   --     Estimated Creatinine Clearance: 46.2 mL/min (by C-G formula based on Cr of 0.92).  Assessment: Started on heparin when INR was <2, INR is now up to 2.3 this AM  Goal of Therapy:  INR 2-2.5 per outpatient anti-coag notes Monitor platelets by anticoagulation protocol: Yes   Plan:  -Stop heparin  -Re-start heparin when INR is <2 -Planning for cath when INR is <1.8  Narda Bonds 03/12/2016,5:48 AM

## 2016-03-12 NOTE — Progress Notes (Signed)
TELEMETRY: Reviewed telemetry pt in Atrial fibrillation with controlled rate. : Filed Vitals:   03/11/16 1556 03/11/16 1957 03/11/16 2322 03/12/16 0430  BP: 124/72 124/71 97/51 111/61  Pulse:  100 66 85  Temp:  98.4 F (36.9 C) 98.3 F (36.8 C) 98.5 F (36.9 C)  TempSrc:  Oral Oral Oral  Resp:  16    Height:      Weight:    143 lb 1.3 oz (64.9 kg)  SpO2:  97% 97% 99%    Intake/Output Summary (Last 24 hours) at 03/12/16 0821 Last data filed at 03/12/16 0630  Gross per 24 hour  Intake 1246.25 ml  Output   1800 ml  Net -553.75 ml   Filed Weights   03/11/16 0118 03/11/16 0656 03/12/16 0430  Weight: 143 lb (64.864 kg) 145 lb 8 oz (65.998 kg) 143 lb 1.3 oz (64.9 kg)    Subjective Still feels some discomfort in left shoulder area- mild. Otherwise feels well.  Marland Kitchen atorvastatin  80 mg Oral q1800  . chlorthalidone  12.5 mg Oral Daily  . diltiazem  120 mg Oral Daily  . furosemide  20 mg Oral Daily  . metoprolol tartrate  12.5 mg Oral BID  . nitroGLYCERIN  1 inch Topical Q6H  . potassium chloride  30 mEq Oral BID  . sodium chloride flush  3 mL Intravenous Q12H  . Warfarin - Pharmacist Dosing Inpatient   Does not apply q1800   . sodium chloride 10 mL/hr at 03/12/16 0700    LABS: Basic Metabolic Panel:  Recent Labs  03/11/16 0238 03/12/16 0430  NA 132* 132*  K 3.4* 4.4  CL 100* 100*  CO2 23 24  GLUCOSE 131* 105*  BUN 14 14  CREATININE 0.92 1.11*  CALCIUM 9.8 9.7   Liver Function Tests:  Recent Labs  03/12/16 0430  AST 121*  ALT 23  ALKPHOS 115  BILITOT 0.8  PROT 7.7  ALBUMIN 3.6   No results for input(s): LIPASE, AMYLASE in the last 72 hours. CBC:  Recent Labs  03/11/16 0210 03/12/16 0430  WBC 5.9 8.1  NEUTROABS 4.0  --   HGB 11.0* 9.9*  HCT 32.8* 30.1*  MCV 90.6 91.8  PLT 203 213   Cardiac Enzymes:  Recent Labs  03/11/16 0747 03/11/16 1226  TROPONINI 2.01* 5.32*   BNP: No results for input(s): PROBNP in the last 72  hours. D-Dimer: No results for input(s): DDIMER in the last 72 hours. Hemoglobin A1C: No results for input(s): HGBA1C in the last 72 hours. Fasting Lipid Panel:  Recent Labs  03/12/16 0430  CHOL 144  HDL 49  LDLCALC 88  TRIG 35  CHOLHDL 2.9   Thyroid Function Tests: No results for input(s): TSH, T4TOTAL, T3FREE, THYROIDAB in the last 72 hours.  Invalid input(s): FREET3   Radiology/Studies:  Dg Chest 2 View  03/11/2016  CLINICAL DATA:  Acute onset of left-sided chest pain, radiating to the left arm. Initial encounter. EXAM: CHEST  2 VIEW COMPARISON:  Chest radiograph from 07/17/2013 FINDINGS: The lungs are well-aerated. Minimal bibasilar atelectasis or scarring is noted. Peribronchial thickening is seen. There is no evidence of pleural effusion or pneumothorax. The heart is enlarged. The patient is status post median sternotomy. A valve replacement is noted. A pacemaker is noted at the left chest wall, with a single lead ending at the right ventricle. No acute osseous abnormalities are seen. IMPRESSION: 1. Minimal bibasilar atelectasis or scarring noted. Peribronchial thickening seen. Lungs otherwise grossly clear.  2. Cardiomegaly. Electronically Signed   By: Garald Balding M.D.   On: 03/11/2016 02:30    PHYSICAL EXAM General: Well developed, well nourished, female in no acute distress Head: Eyes PERRLA, No xanthomas. Normocephalic and atraumatic, oropharynx without edema or exudate.  Lungs: Resp regular and unlabored, CTA. Heart: Irregular no s3, s4, good mechanical MV click. Gr 2/6 systolic murmur at apex. Neck: No carotid bruits. No lymphadenopathy. + JVD. Abdomen: Bowel sounds present, abdomen soft and non-tender without masses or hernias noted. Msk: No spine or cva tenderness. No weakness, no joint deformities or effusions. Extremities: No clubbing, cyanosis or edema. DP/PT/Radials 2+ and equal bilaterally. Neuro: Alert and oriented X 3. No focal deficits noted. Psych:  Good affect, responds appropriately Skin: No rashes or lesions noted.  ASSESSMENT AND PLAN: 1. NSTEMI. Troponin increased to 5.32. No Ecg this am- will order. Planned cardiac cath today but INR too high at 2.3. Last coumadin dose 6/20. Will repeat INR in am. Plan cardiac cath when INR < 1.8. Plan IV heparin when INR <2. Continue metoprolol and cardizem. Continue statin therapy. Last cardiac cath 2004.  2. 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 Anticoagulation as noted above.  3. 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 No overt right heart failure. 4. HTN controlled. 5. Chronic diastolic CHF. Weight stable. Continue oral lasix. 6. Atrial fibrillation- permanent. Rate controlled.  7. SSS s/p pacemaker 8. Hypokalemia resolved.  9. History of SAH in 2012.  Target INR 2-2.5  10. 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  11. Anemia. Follow CBC.  Present on Admission:  . Atrial fibrillation with slow ventricular response ([place]) . HTN (hypertension) . Moderate to severe tricuspid regurgitation . NSTEMI (non-ST elevated myocardial infarction) ([place])  Signed, Terrica Duecker [place], [place] 03/12/2016 8:21 AM

## 2016-03-12 NOTE — Progress Notes (Addendum)
PROGRESS NOTE    Carrie [name]  [id number]  DOB: 09/15/1941  DOA: 03/11/2016 PCP: Elyn Peers, MD Outpatient Specialists:   Hospital course: Carrie [name] is a 75 y.o. 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  The patient was in her normal health until tonight at midnight while watching TV when she had onset of left sided chest pressure. The pain was located in the left chest, radiating to the left arm. It was associated with nausea and "feeling very uncomfortable". It was constant and pressure like and unlike any sensation she had had before. Her husband called 9-1-1 and she was transported here.   Assessment & Plan:   1. Chest pain/NSTEMI The patient presented with HEART score of 6.Troponins have been elevated and planning for cath 6/23.  Continue to follow and monitor closely.  IV heparin.  Nitropaste, metoprolol  2. HTN:  -Continue chlorthalidone -metroprolol 12.5 BID  3. Afib and SSS with pacer:  CHADS2Vasc 3. On warfarin, INR goal 2.0-2.5 given hx of SAH. But holding warfarin for cath.  Need INR to be <1.8 -Hold diltiazem and metoprolol in case stress test  4. Chronic diastolic CHF:  - restarted furosemide  5. Anemia:  This and thrombocytopenia have been chronic for patient.  6. Hypokalemia: -Replete orally  7. Hyponatremia - suspect this is secondary to thiazide and other diuretic use, monitoring for now, d/c chlorthalidone for now  Code Status: Full  Consults called: Cardiology Admission status: Telemetry, observation  Procedures:  Cath scheduled for 6/23  Subjective: Reports left shoulder discomfort  Objective: Filed Vitals:   03/11/16 1556 03/11/16 1957 03/11/16 2322 03/12/16 0430  BP: 124/72 124/71 97/51 111/61  Pulse:  100 66 85  Temp:  98.4 F (36.9 C) 98.3 F (36.8 C) 98.5 F (36.9  C)  TempSrc:  Oral Oral Oral  Resp:  16    Height:      Weight:    143 lb 1.3 oz (64.9 kg)  SpO2:  97% 97% 99%    Intake/Output Summary (Last 24 hours) at 03/12/16 0851 Last data filed at 03/12/16 0630  Gross per 24 hour  Intake 1246.25 ml  Output   1800 ml  Net -553.75 ml   Filed Weights   03/11/16 0118 03/11/16 0656 03/12/16 0430  Weight: 143 lb (64.864 kg) 145 lb 8 oz (65.998 kg) 143 lb 1.3 oz (64.9 kg)    Exam:  General appearance: Well-developed, elderly adult female, alert and in no acute distress. Skin: Warm and dry.  Cardiac: RRR, nl S1-S2, no murmurs appreciated, mechanical sounding valve. Capillary refill is brisk Respiratory: Normal respiratory rate and rhythm. CTAB without rales or wheezes. Abdomen: Abdomen soft without rigidity. No TTP.No ascites, distension.  MSK: No deformities or effusions. Pain not reproduced with palpation of precordium. No pain with arm movement. Neuro: Sensorium intact and responding to questions, attention normal. Speech is fluent. Moves all extremities equally and with normal coordination.  Psych: Behavior appropriate. Affect normal. No evidence of aural or visual hallucinations or delusions.   Data Reviewed: Basic Metabolic Panel:  Recent Labs Lab 03/11/16 0238 03/12/16 0430  NA 132* 132*  K 3.4* 4.4  CL 100* 100*  CO2 23 24  GLUCOSE 131* 105*  BUN 14 14  CREATININE 0.92 1.11*  CALCIUM 9.8 9.7   Liver Function Tests:  Recent Labs  Lab 03/12/16 0430  AST 121*  ALT 23  ALKPHOS 115  BILITOT 0.8  PROT 7.7  ALBUMIN 3.6   No results for input(s): LIPASE, AMYLASE in the last 168 hours. No results for input(s): AMMONIA in the last 168 hours. CBC:  Recent Labs Lab 03/11/16 0210 03/12/16 0430  WBC 5.9 8.1  NEUTROABS 4.0  --   HGB 11.0* 9.9*  HCT 32.8* 30.1*  MCV 90.6 91.8  PLT 203 213   Cardiac Enzymes:  Recent Labs Lab 03/11/16 0747 03/11/16 1226  TROPONINI 2.01* 5.32*   BNP (last 3  results) No results for input(s): PROBNP in the last 8760 hours. CBG: No results for input(s): GLUCAP in the last 168 hours.  No results found for this or any previous visit (from the past 240 hour(s)).   Studies: Dg Chest 2 View  03/11/2016  CLINICAL DATA:  Acute onset of left-sided chest pain, radiating to the left arm. Initial encounter. EXAM: CHEST  2 VIEW COMPARISON:  Chest radiograph from 07/17/2013 FINDINGS: The lungs are well-aerated. Minimal bibasilar atelectasis or scarring is noted. Peribronchial thickening is seen. There is no evidence of pleural effusion or pneumothorax. The heart is enlarged. The patient is status post median sternotomy. A valve replacement is noted. A pacemaker is noted at the left chest wall, with a single lead ending at the right ventricle. No acute osseous abnormalities are seen. IMPRESSION: 1. Minimal bibasilar atelectasis or scarring noted. Peribronchial thickening seen. Lungs otherwise grossly clear. 2. Cardiomegaly. Electronically Signed   By: Garald Balding M.D.   On: 03/11/2016 02:30     Scheduled Meds: . atorvastatin  80 mg Oral q1800  . chlorthalidone  12.5 mg Oral Daily  . diltiazem  120 mg Oral Daily  . furosemide  20 mg Oral Daily  . metoprolol tartrate  12.5 mg Oral BID  . nitroGLYCERIN  1 inch Topical Q6H  . pantoprazole  40 mg Oral Daily  . potassium chloride  30 mEq Oral BID  . sodium chloride flush  3 mL Intravenous Q12H  . Warfarin - Pharmacist Dosing Inpatient   Does not apply q1800   Continuous Infusions: . sodium chloride 10 mL/hr at 03/12/16 0700    Principal Problem:   NSTEMI (non-ST elevated myocardial infarction) [facility]) Active Problems:   S/P mitral valve replacement, St Jude   Chronic anticoagulation, ( INR goal 2.0-2.5 due to history of subdural hematoma and liver hemorrhage)   HTN (hypertension)   Atrial fibrillation with slow ventricular response (HCC)   Moderate to severe tricuspid regurgitation  Time spent:    Irwin Brakeman, MD, FAAFP [facility] Pager [phone]  If 7PM-7AM, please contact night-coverage [email or web address] Password [facility] 03/12/2016, 8:51 AM    LOS: 1 day

## 2016-03-12 NOTE — Plan of Care (Signed)
Problem: Phase II Progression Outcomes Goal: Hemodynamically stable Outcome: Progressing VSS

## 2016-03-12 NOTE — Progress Notes (Signed)
ANTICOAGULATION CONSULT NOTE - Follow Up Consult  Pharmacy Consult for Heparin  Indication: chest pain/ACS and atrial fibrillation, MVR  Allergies  Allergen Reactions  . Codeine Nausea And Vomiting    Patient Measurements: Height: 5\' 1"  (154.9 cm) Weight: 143 lb 1.3 oz (64.9 kg) IBW/kg (Calculated) : 47.8  Vital Signs: Temp: 98.4 F (36.9 C) (06/22 1300) Temp Source: Oral (06/22 1300) BP: 87/71 mmHg (06/22 1300) Pulse Rate: 83 (06/22 1300)  Labs:  Recent Labs  03/11/16 0210 03/11/16 0238  03/11/16 0747 03/11/16 1226 03/11/16 1953 03/12/16 0430 03/12/16 0829 03/12/16 1635  HGB 11.0*  --   --   --   --   --  9.9*  --   --   HCT 32.8*  --   --   --   --   --  30.1*  --   --   PLT 203  --   --   --   --   --  213  --   --   LABPROT  --   --   < >  --   --   --  25.1* 23.7* 22.9*  INR  --   --   < >  --   --   --  2.30* 2.14* 2.05*  HEPARINUNFRC  --   --   --   --   --  0.49 0.92*  --   --   CREATININE  --  0.92  --   --   --   --  1.11*  --   --   TROPONINI  --   --   --  2.01* 5.32*  --   --   --   --   < > = values in this interval not displayed.  Estimated Creatinine Clearance: 38.3 mL/min (by C-G formula based on Cr of 1.11).  Assessment: INR this evening = 2.05, will start heparin tonight at 8 pm Expect INR to be < 2 by then  Goal of Therapy:  INR 2-2.5 per outpatient anti-coag notes Monitor platelets by anticoagulation protocol: Yes  Heparin level = 0.3-0.7   Plan:  Heparin at 800 units / hr at 8 pm Daily heparin level, CBC Planning cath when INR around 1.8  Thank you Anette Guarneri, PharmD [phone] 03/12/2016,5:37 PM

## 2016-03-13 LAB — CBC WITH DIFFERENTIAL/PLATELET
BASOS ABS: 0 10*3/uL (ref 0.0–0.1)
Basophils Relative: 0 %
Eosinophils Absolute: 0.2 10*3/uL (ref 0.0–0.7)
Eosinophils Relative: 3 %
HEMATOCRIT: 28.7 % — AB (ref 36.0–46.0)
HEMOGLOBIN: 9.3 g/dL — AB (ref 12.0–15.0)
LYMPHS PCT: 24 %
Lymphs Abs: 1.8 10*3/uL (ref 0.7–4.0)
MCH: 29.1 pg (ref 26.0–34.0)
MCHC: 32.4 g/dL (ref 30.0–36.0)
MCV: 89.7 fL (ref 78.0–100.0)
MONO ABS: 1.2 10*3/uL — AB (ref 0.1–1.0)
MONOS PCT: 16 %
NEUTROS ABS: 4.1 10*3/uL (ref 1.7–7.7)
Neutrophils Relative %: 57 %
Platelets: 223 10*3/uL (ref 150–400)
RBC: 3.2 MIL/uL — ABNORMAL LOW (ref 3.87–5.11)
RDW: 18.5 % — AB (ref 11.5–15.5)
WBC: 7.3 10*3/uL (ref 4.0–10.5)

## 2016-03-13 LAB — BASIC METABOLIC PANEL
ANION GAP: 6 (ref 5–15)
BUN: 14 mg/dL (ref 6–20)
CO2: 23 mmol/L (ref 22–32)
Calcium: 9.3 mg/dL (ref 8.9–10.3)
Chloride: 103 mmol/L (ref 101–111)
Creatinine, Ser: 1.19 mg/dL — ABNORMAL HIGH (ref 0.44–1.00)
GFR calc Af Amer: 51 mL/min — ABNORMAL LOW (ref >60)
GFR calc non Af Amer: 44 mL/min — ABNORMAL LOW (ref >60)
GLUCOSE: 97 mg/dL (ref 65–99)
POTASSIUM: 4.9 mmol/L (ref 3.5–5.1)
Sodium: 132 mmol/L — ABNORMAL LOW (ref 135–145)

## 2016-03-13 LAB — PROTIME-INR
INR: 2.02 — ABNORMAL HIGH (ref 0.00–1.49)
Prothrombin Time: 22.7 seconds — ABNORMAL HIGH (ref 11.6–15.2)

## 2016-03-13 LAB — HEPARIN LEVEL (UNFRACTIONATED)
HEPARIN UNFRACTIONATED: 1.03 [IU]/mL — AB (ref 0.30–0.70)
Heparin Unfractionated: 0.49 IU/mL (ref 0.30–0.70)
Heparin Unfractionated: 0.7 IU/mL (ref 0.30–0.70)

## 2016-03-13 MED ORDER — HEPARIN (PORCINE) IN NACL 100-0.45 UNIT/ML-% IJ SOLN
550.0000 [IU]/h | INTRAMUSCULAR | Status: DC
  Administered 2016-03-14: 550 [IU]/h via INTRAVENOUS
  Filled 2016-03-13: qty 250

## 2016-03-13 NOTE — Progress Notes (Signed)
ANTICOAGULATION CONSULT NOTE - Follow Up Consult  Pharmacy Consult for Heparin (warfarin on hold) Indication: chest pain/ACS and atrial fibrillation, MVR  Allergies  Allergen Reactions  . Codeine Nausea And Vomiting    Patient Measurements: Height: 5\' 1"  (154.9 cm) Weight: 143 lb 11.2 oz (65.182 kg) IBW/kg (Calculated) : 47.8  Vital Signs: Temp: 98.5 F (36.9 C) (06/23 0449) Temp Source: Oral (06/23 0449) BP: 102/56 mmHg (06/23 0449) Pulse Rate: 130 (06/23 0449)  Labs:  Recent Labs  03/11/16 0210 03/11/16 0238  03/11/16 0747 03/11/16 1226 03/11/16 1953 03/12/16 0430 03/12/16 0829 03/12/16 1635 03/13/16 0438  HGB 11.0*  --   --   --   --   --  9.9*  --   --  9.3*  HCT 32.8*  --   --   --   --   --  30.1*  --   --  28.7*  PLT 203  --   --   --   --   --  213  --   --  223  LABPROT  --   --   < >  --   --   --  25.1* 23.7* 22.9* 22.7*  INR  --   --   < >  --   --   --  2.30* 2.14* 2.05* 2.02*  HEPARINUNFRC  --   --   --   --   --  0.49 0.92*  --   --  0.49  CREATININE  --  0.92  --   --   --   --  1.11*  --   --  1.19*  TROPONINI  --   --   --  2.01* 5.32*  --   --   --   --   --   < > = values in this interval not displayed.  Estimated Creatinine Clearance: 35.9 mL/min (by C-G formula based on Cr of 1.19).  Assessment: Heparin while warfarin on hold in anticipation of cath  Goal of Therapy:  INR 2-2.5 per outpatient anti-coag notes Monitor platelets by anticoagulation protocol: Yes   Plan:  -Cont heparin at 800 units/hr -1200 HL  Narda Bonds 03/13/2016,6:09 AM

## 2016-03-13 NOTE — Progress Notes (Signed)
ANTICOAGULATION CONSULT NOTE - Follow Up Consult  Pharmacy Consult for Heparin  Indication: chest pain/ACS and atrial fibrillation, MVR  Allergies  Allergen Reactions  . Codeine Nausea And Vomiting    Patient Measurements: Height: 5\' 1"  (154.9 cm) Weight: 143 lb 11.2 oz (65.182 kg) IBW/kg (Calculated) : 47.8  Vital Signs: Temp: 98.5 F (36.9 C) (06/23 1925) Temp Source: Oral (06/23 1925) BP: 120/57 mmHg (06/23 1925) Pulse Rate: 73 (06/23 1925)  Labs:  Recent Labs  03/11/16 0210 03/11/16 0238  03/11/16 0747 03/11/16 1226  03/12/16 0430 03/12/16 0829 03/12/16 1635 03/13/16 0438 03/13/16 1133 03/13/16 1925  HGB 11.0*  --   --   --   --   --  9.9*  --   --  9.3*  --   --   HCT 32.8*  --   --   --   --   --  30.1*  --   --  28.7*  --   --   PLT 203  --   --   --   --   --  213  --   --  223  --   --   LABPROT  --   --   < >  --   --   --  25.1* 23.7* 22.9* 22.7*  --   --   INR  --   --   < >  --   --   --  2.30* 2.14* 2.05* 2.02*  --   --   HEPARINUNFRC  --   --   --   --   --   < > 0.92*  --   --  0.49 1.03* 0.70  CREATININE  --  0.92  --   --   --   --  1.11*  --   --  1.19*  --   --   TROPONINI  --   --   --  2.01* 5.32*  --   --   --   --   --   --   --   < > = values in this interval not displayed.  Estimated Creatinine Clearance: 35.9 mL/min (by C-G formula based on Cr of 1.19).  Assessment: 75 year old female continues on heparin for chest pain / valve in preparation for cath once INR appropriate Heparin level this evening = 0.7  Goal of Therapy:  INR 2-2.5 per outpatient anti-coag notes Monitor platelets by anticoagulation protocol: Yes  Heparin level = 0.3-0.7   Plan:  Decrease heparin to 550 units / hr Daily heparin level, CBC Planning cath when INR around 1.8  Thank you Anette Guarneri, PharmD [phone] 03/13/2016,8:06 PM

## 2016-03-13 NOTE — Progress Notes (Signed)
TELEMETRY: Reviewed telemetry pt in Atrial fibrillation with controlled rate. : Filed Vitals:   03/12/16 1300 03/12/16 1830 03/12/16 2017 03/13/16 0449  BP: 87/71 103/60 120/66 102/56  Pulse: 83 61 89 130  Temp: 98.4 F (36.9 C)  99.7 F (37.6 C) 98.5 F (36.9 C)  TempSrc: Oral  Oral Oral  Resp: 17 18 18 18   Height:      Weight:    143 lb 11.2 oz (65.182 kg)  SpO2: 98%  97% 98%    Intake/Output Summary (Last 24 hours) at 03/13/16 0946 Last data filed at 03/13/16 0300  Gross per 24 hour  Intake    680 ml  Output    350 ml  Net    330 ml   Filed Weights   03/11/16 0656 03/12/16 0430 03/13/16 0449  Weight: 145 lb 8 oz (65.998 kg) 143 lb 1.3 oz (64.9 kg) 143 lb 11.2 oz (65.182 kg)    Subjective Feeling better today. No chest pain.  Marland Kitchen atorvastatin  80 mg Oral q1800  . diltiazem  120 mg Oral Daily  . furosemide  20 mg Oral Daily  . metoprolol tartrate  12.5 mg Oral BID  . nitroGLYCERIN  1 inch Topical Q6H  . pantoprazole  40 mg Oral Daily  . potassium chloride  30 mEq Oral BID  . sodium chloride flush  3 mL Intravenous Q12H  . Warfarin - Pharmacist Dosing Inpatient   Does not apply q1800   . sodium chloride 10 mL/hr at 03/12/16 0700  . heparin 800 Units/hr (03/12/16 2027)    LABS: Basic Metabolic Panel:  Recent Labs  03/12/16 0430 03/13/16 0438  NA 132* 132*  K 4.4 4.9  CL 100* 103  CO2 24 23  GLUCOSE 105* 97  BUN 14 14  CREATININE 1.11* 1.19*  CALCIUM 9.7 9.3   Liver Function Tests:  Recent Labs  03/12/16 0430  AST 121*  ALT 23  ALKPHOS 115  BILITOT 0.8  PROT 7.7  ALBUMIN 3.6   No results for input(s): LIPASE, AMYLASE in the last 72 hours. CBC:  Recent Labs  03/11/16 0210 03/12/16 0430 03/13/16 0438  WBC 5.9 8.1 7.3  NEUTROABS 4.0  --  4.1  HGB 11.0* 9.9* 9.3*  HCT 32.8* 30.1* 28.7*  MCV 90.6 91.8 89.7  PLT 203 213 223   Cardiac Enzymes:  Recent Labs  03/11/16 0747 03/11/16 1226  TROPONINI 2.01* 5.32*   BNP: No results  for input(s): PROBNP in the last 72 hours. D-Dimer: No results for input(s): DDIMER in the last 72 hours. Hemoglobin A1C: No results for input(s): HGBA1C in the last 72 hours. Fasting Lipid Panel:  Recent Labs  03/12/16 0430  CHOL 144  HDL 49  LDLCALC 88  TRIG 35  CHOLHDL 2.9   Thyroid Function Tests: No results for input(s): TSH, T4TOTAL, T3FREE, THYROIDAB in the last 72 hours.  Invalid input(s): FREET3   Radiology/Studies:  No results found.  PHYSICAL EXAM General: Well developed, well nourished, female in no acute distress Head: Eyes PERRLA, No xanthomas. Normocephalic and atraumatic, oropharynx without edema or exudate.  Lungs: Resp regular and unlabored, CTA. Heart: Irregular no s3, s4, good mechanical MV click. Gr 2/6 systolic murmur at apex. Neck: No carotid bruits. No lymphadenopathy. + JVD. Abdomen: Bowel sounds present, abdomen soft and non-tender without masses or hernias noted. Msk: No spine or cva tenderness. No weakness, no joint deformities or effusions. Extremities: No clubbing, cyanosis or edema. DP/PT/Radials 2+ and equal bilaterally.  Neuro: Alert and oriented X 3. No focal deficits noted. Psych: Good affect, responds appropriately Skin: No rashes or lesions noted.  ASSESSMENT AND PLAN: 1. NSTEMI. Troponin increased to 5.32. Planned cardiac cath today but INR still too high at 2.05. Last coumadin dose 6/20. Will need to postpone cardiac cath until Monday.  Plan cardiac cath when INR < 1.8. Plan IV heparin per pharmacy. Continue metoprolol and cardizem. Continue statin therapy. Last cardiac cath 2004.  2. 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 Anticoagulation as noted above.  3. 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 No overt right heart failure. 4. HTN controlled. 5. Chronic diastolic CHF. Weight stable. Continue oral lasix. 6. Atrial fibrillation- permanent. Rate controlled.  7. SSS s/p pacemaker 8. Hypokalemia resolved.  9. History of SAH in 2012.  Target INR  2-2.5  10. 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  11. Anemia. Follow CBC.  Present on Admission:  . Atrial fibrillation with slow ventricular response ([place]) . HTN (hypertension) . Moderate to severe tricuspid regurgitation . NSTEMI (non-ST elevated myocardial infarction) ([place]) . Hyponatremia  Signed, B[place]unna Grindstaff [place], [place] 03/13/2016 9:46 AM

## 2016-03-13 NOTE — Progress Notes (Signed)
PROGRESS NOTE    Carrie [name]  [id number]  DOB: 1941/02/09  DOA: 03/11/2016 PCP: Elyn Peers, MD Outpatient Specialists:   Hospital course: Carrie [name] is a 75 y.o. 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  The patient was in her normal health until tonight at midnight while watching TV when she had onset of left sided chest pressure. The pain was located in the left chest, radiating to the left arm. It was associated with nausea and "feeling very uncomfortable". It was constant and pressure like and unlike any sensation she had had before. Her husband called 9-1-1 and she was transported here. Pt for cardiac cath on 6/23.    Assessment & Plan:   1. Chest pain/NSTEMI The patient presented with HEART score of 6.Troponins have been elevated and planning for cath 6/23.  Pt reports resolution of chest pain.  Continue to follow and monitor closely.  IV heparin.  Nitropaste, metoprolol.  Further recommendations following cath.   2. HTN:  -metroprolol 12.5 BID  3. Afib and SSS with pacer:  CHADS2Vasc 3. On warfarin, INR goal 2.0-2.5 given hx of SAH. But holding warfarin for cath.  Need INR to be <1.8 -Hold diltiazem and metoprolol in case stress test  4. Chronic diastolic CHF:  - restarted furosemide  5. Anemia:  This and thrombocytopenia have been chronic for patient.  6. Hypokalemia: -Replete orally  7. Hyponatremia - unchanged, stable, suspect this is secondary to thiazide and other diuretic use, monitoring for now, d/c chlorthalidone for now  Code Status: Full  Consults called: Cardiology Admission status: Telemetry, observation  Procedures:  Cath scheduled for 6/23  Subjective: Pt denies any complaints today.    Objective: Filed Vitals:   03/12/16 1300 03/12/16 1830 03/12/16 2017 03/13/16 0449  BP:  87/71 103/60 120/66 102/56  Pulse: 83 61 89 130  Temp: 98.4 F (36.9 C)  99.7 F (37.6 C) 98.5 F (36.9 C)  TempSrc: Oral  Oral Oral  Resp: 17 18 18 18   Height:      Weight:    143 lb 11.2 oz (65.182 kg)  SpO2: 98%  97% 98%    Intake/Output Summary (Last 24 hours) at 03/13/16 0852 Last data filed at 03/13/16 0300  Gross per 24 hour  Intake    680 ml  Output    350 ml  Net    330 ml   Filed Weights   03/11/16 0656 03/12/16 0430 03/13/16 0449  Weight: 145 lb 8 oz (65.998 kg) 143 lb 1.3 oz (64.9 kg) 143 lb 11.2 oz (65.182 kg)    Exam:  General appearance: Well-developed, elderly adult female, alert and in no acute distress. Skin: Warm and dry.  Cardiac: RRR, nl S1-S2, no murmurs appreciated, mechanical sounding valve. Capillary refill is brisk Respiratory: Normal respiratory rate and rhythm. CTAB without rales or wheezes. Abdomen: Abdomen soft without rigidity. No TTP.No ascites, distension.  MSK: No deformities or effusions. Pain not reproduced with palpation of precordium. No pain with arm movement. Neuro: Sensorium intact and responding to questions, attention normal. Speech is fluent. Moves all extremities equally and with normal coordination.  Psych: Behavior appropriate. Affect normal. No evidence of aural or visual hallucinations or delusions.   Data Reviewed: Basic Metabolic Panel:  Recent Labs Lab 03/11/16 0238 03/12/16 0430 03/13/16 0438  NA 132* 132* 132*  K 3.4* 4.4 4.9  CL 100* 100* 103  CO2 23 24 23   GLUCOSE 131* 105* 97  BUN 14 14 14   CREATININE 0.92 1.11* 1.19*  CALCIUM 9.8 9.7 9.3   Liver Function Tests:  Recent Labs Lab 03/12/16 0430  AST 121*  ALT 23  ALKPHOS 115  BILITOT 0.8  PROT 7.7  ALBUMIN 3.6   No results for input(s): LIPASE, AMYLASE in the last 168 hours. No results for input(s): AMMONIA in the last 168 hours. CBC:  Recent Labs Lab 03/11/16 0210 03/12/16 0430 03/13/16 0438  WBC 5.9 8.1 7.3  NEUTROABS  4.0  --  4.1  HGB 11.0* 9.9* 9.3*  HCT 32.8* 30.1* 28.7*  MCV 90.6 91.8 89.7  PLT 203 213 223   Cardiac Enzymes:  Recent Labs Lab 03/11/16 0747 03/11/16 1226  TROPONINI 2.01* 5.32*   BNP (last 3 results) No results for input(s): PROBNP in the last 8760 hours. CBG: No results for input(s): GLUCAP in the last 168 hours.  No results found for this or any previous visit (from the past 240 hour(s)).   Studies: No results found.   Scheduled Meds: . atorvastatin  80 mg Oral q1800  . diltiazem  120 mg Oral Daily  . furosemide  20 mg Oral Daily  . metoprolol tartrate  12.5 mg Oral BID  . nitroGLYCERIN  1 inch Topical Q6H  . pantoprazole  40 mg Oral Daily  . potassium chloride  30 mEq Oral BID  . sodium chloride flush  3 mL Intravenous Q12H  . Warfarin - Pharmacist Dosing Inpatient   Does not apply q1800   Continuous Infusions: . sodium chloride 10 mL/hr at 03/12/16 0700  . heparin 800 Units/hr (03/12/16 2027)    Principal Problem:   NSTEMI (non-ST elevated myocardial infarction) ([place]) Active Problems:   Hyponatremia   S/P mitral valve replacement, [employer]   Chronic anticoagulation, ( INR goal 2.0-2.5 due to history of subdural hematoma and liver hemorrhage)   HTN (hypertension)   Atrial fibrillation with slow ventricular response ([facility])   Moderate to severe tricuspid regurgitation  Time spent:   Irwin Brakeman, MD, FAAFP [facility] Pager [phone]  If 7PM-7AM, please contact night-coverage [email or web address] Password [facility] 03/13/2016, 8:52 AM    LOS: 2 days

## 2016-03-13 NOTE — Progress Notes (Signed)
ANTICOAGULATION CONSULT NOTE - Follow Up Consult  Pharmacy Consult for Heparin Indication: Mech AVR  Allergies  Allergen Reactions  . Codeine Nausea And Vomiting    Patient Measurements: Height: 5\' 1"  (154.9 cm) Weight: 143 lb 11.2 oz (65.182 kg) IBW/kg (Calculated) : 47.8 Heparin Dosing Weight: 61 kg  Vital Signs: Temp: 98.5 F (36.9 C) (06/23 0449) Temp Source: Oral (06/23 0449) BP: 102/56 mmHg (06/23 0449) Pulse Rate: 130 (06/23 0449)  Labs:  Recent Labs  03/11/16 0210 03/11/16 0238  03/11/16 0747 03/11/16 1226  03/12/16 0430 03/12/16 0829 03/12/16 1635 03/13/16 0438 03/13/16 1133  HGB 11.0*  --   --   --   --   --  9.9*  --   --  9.3*  --   HCT 32.8*  --   --   --   --   --  30.1*  --   --  28.7*  --   PLT 203  --   --   --   --   --  213  --   --  223  --   LABPROT  --   --   < >  --   --   --  25.1* 23.7* 22.9* 22.7*  --   INR  --   --   < >  --   --   --  2.30* 2.14* 2.05* 2.02*  --   HEPARINUNFRC  --   --   --   --   --   < > 0.92*  --   --  0.49 1.03*  CREATININE  --  0.92  --   --   --   --  1.11*  --   --  1.19*  --   TROPONINI  --   --   --  2.01* 5.32*  --   --   --   --   --   --   < > = values in this interval not displayed.  Estimated Creatinine Clearance: 35.9 mL/min (by C-G formula based on Cr of 1.19).  Assessment:  Anticoag:  warfarin/heparin for [employer]. MVR. Of note pt has low INR goal due to hx of subdural hematoma and liver hemorrhage.  Starting heparin (no bolus) today d/t positive troponin and pt subtherapeutic with mitral valve. Now plan to hold warfarin w/plan for cath when INR <1.8. Hgb drop 11>9.9>9.3. INR 1.98>2.3>2.0. Given Vitamin K 1mg  po 6/22 PTA dose:  2.5 mg on Sundays, 5 mg AOD - Heparin level this AM was 0.49 in goal now up to 1.03?  Goal of Therapy:  Heparin level 0.3-0.7 units/ml Monitor platelets by anticoagulation protocol: Yes   Plan:  - Hold heparin x 1 hour - Then resume IV heparin at 650 units/hr -  Watch rising K+ on Kdur 30 BID - Hold warfarin, plan for cath when INR < 1.8 and/or Monday - Daily heparin level, CBC  Deontay Ladnier S. Alford Highland, PharmD, BCPS Clinical Staff Pharmacist Pager [phone]  Eilene Ghazi [place] 03/13/2016,12:36 PM

## 2016-03-13 NOTE — Research (Signed)
LEADERS FREE Informed Consent   Subject Name: Carrie [name]  Subject met inclusion and exclusion criteria.  The informed consent form, study requirements and expectations were reviewed with the subject and questions and concerns were addressed prior to the signing of the consent form.  The subject verbalized understanding of the trial requirements.  The subject agreed to participate in the LEADERS FREE II trial and signed the informed consent.  The informed consent was obtained prior to performance of any protocol-specific procedures for the subject.  A copy of the signed informed consent was given to the subject and a copy was placed in the subject's medical record. If patient meets angiographic criteria she can be a potential candidate for the trial.  Berneda Rose 03/13/2016, 8:03 AM

## 2016-03-13 NOTE — Care Management Important Message (Signed)
Important Message  Patient Details  Name: RIDA BUTTREY MRN: [id number] Date of Birth: [birth year]   Medicare Important Message Given:  Yes    Nathen May 03/13/2016, 11:23 AM

## 2016-03-13 NOTE — Care Management Important Message (Signed)
Important Message  Patient Details  Name: Carrie [name] MRN: [id number] Date of Birth: 1941/09/12   Medicare Important Message Given:  Yes    Nathen May 03/13/2016, 10:41 AM

## 2016-03-14 DIAGNOSIS — R0789 Other chest pain: Secondary | ICD-10-CM

## 2016-03-14 LAB — HEPARIN LEVEL (UNFRACTIONATED): Heparin Unfractionated: 0.46 IU/mL (ref 0.30–0.70)

## 2016-03-14 LAB — CBC
HEMATOCRIT: 28.1 % — AB (ref 36.0–46.0)
HEMOGLOBIN: 9.1 g/dL — AB (ref 12.0–15.0)
MCH: 29.1 pg (ref 26.0–34.0)
MCHC: 32.4 g/dL (ref 30.0–36.0)
MCV: 89.8 fL (ref 78.0–100.0)
Platelets: 215 10*3/uL (ref 150–400)
RBC: 3.13 MIL/uL — AB (ref 3.87–5.11)
RDW: 18.5 % — ABNORMAL HIGH (ref 11.5–15.5)
WBC: 7.2 10*3/uL (ref 4.0–10.5)

## 2016-03-14 LAB — PROTIME-INR
INR: 1.8 — ABNORMAL HIGH (ref 0.00–1.49)
Prothrombin Time: 20.8 seconds — ABNORMAL HIGH (ref 11.6–15.2)

## 2016-03-14 NOTE — Progress Notes (Signed)
ANTICOAGULATION CONSULT NOTE - Follow Up Consult  Pharmacy Consult for Heparin  Indication: chest pain/ACS and atrial fibrillation, MVR  Allergies  Allergen Reactions  . Codeine Nausea And Vomiting    Patient Measurements: Height: 5\' 1"  (154.9 cm) Weight: 144 lb (65.318 kg) IBW/kg (Calculated) : 47.8  Vital Signs: Temp: 98.4 F (36.9 C) (06/24 0607) Temp Source: Oral (06/24 0607) BP: 113/49 mmHg (06/24 0607) Pulse Rate: 58 (06/24 0607)  Labs:  Recent Labs  03/11/16 0747 03/11/16 1226  03/12/16 0430  03/12/16 1635 03/13/16 0438 03/13/16 1133 03/13/16 1925 03/14/16 0225  HGB  --   --   < > 9.9*  --   --  9.3*  --   --  9.1*  HCT  --   --   --  30.1*  --   --  28.7*  --   --  28.1*  PLT  --   --   --  213  --   --  223  --   --  215  LABPROT  --   --   --  25.1*  < > 22.9* 22.7*  --   --  20.8*  INR  --   --   --  2.30*  < > 2.05* 2.02*  --   --  1.80*  HEPARINUNFRC  --   --   < > 0.92*  --   --  0.49 1.03* 0.70 0.46  CREATININE  --   --   --  1.11*  --   --  1.19*  --   --   --   TROPONINI 2.01* 5.32*  --   --   --   --   --   --   --   --   < > = values in this interval not displayed.  Estimated Creatinine Clearance: 35.9 mL/min (by C-G formula based on Cr of 1.19).  Assessment: 75 year old female continues on heparin for chest pain / MVR in preparation for cath once INR appropriate. HL remains therapeutic at 0.46. Continuing to hold warfarin; INR 1.8. CBC now stable; H/H 9.1/28.1, plt 215. No overt bleeding noted.    PTA dose:  5mg  daily except 2.5 mg on Sunday   Goal of Therapy:  INR 2-2.5 per outpatient anti-coag notes Monitor platelets by anticoagulation protocol: Yes  Heparin level = 0.3-0.7   Plan:  Decrease heparin to 550 units / hr Daily heparin level, CBC Planning cath when INR around 1.8  Stephens November, PharmD Clinical Pharmacy Resident 7:37 AM, 03/14/2016

## 2016-03-14 NOTE — Progress Notes (Signed)
Patient ID: Carrie [name], female   DOB: 25-Oct-1940, 75 y.o.   MRN: [id number]   TELEMETRY: Reviewed telemetry pt in Atrial fibrillation with controlled rate. : Filed Vitals:   03/13/16 0449 03/13/16 1505 03/13/16 1925 03/14/16 0607  BP: 102/56 105/60 120/57 113/49  Pulse: 130 68 73 58  Temp: 98.5 F (36.9 C) 98.3 F (36.8 C) 98.5 F (36.9 C) 98.4 F (36.9 C)  TempSrc: Oral Oral Oral Oral  Resp: 18 16 16 16   Height:      Weight: 143 lb 11.2 oz (65.182 kg)   144 lb (65.318 kg)  SpO2: 98% 99% 99% 98%    Intake/Output Summary (Last 24 hours) at 03/14/16 0837 Last data filed at 03/14/16 0700  Gross per 24 hour  Intake 1007.58 ml  Output   2550 ml  Net -1542.42 ml   Filed Weights   03/12/16 0430 03/13/16 0449 03/14/16 0607  Weight: 143 lb 1.3 oz (64.9 kg) 143 lb 11.2 oz (65.182 kg) 144 lb (65.318 kg)    Subjective Feeling better today. No chest pain. Wants some good coffee   . atorvastatin  80 mg Oral q1800  . diltiazem  120 mg Oral Daily  . furosemide  20 mg Oral Daily  . metoprolol tartrate  12.5 mg Oral BID  . nitroGLYCERIN  1 inch Topical Q6H  . pantoprazole  40 mg Oral Daily  . potassium chloride  30 mEq Oral BID  . sodium chloride flush  3 mL Intravenous Q12H  . Warfarin - Pharmacist Dosing Inpatient   Does not apply q1800   . sodium chloride 10 mL/hr at 03/12/16 0700  . heparin 550 Units/hr (03/13/16 2035)    LABS: Basic Metabolic Panel:  Recent Labs  03/12/16 0430 03/13/16 0438  NA 132* 132*  K 4.4 4.9  CL 100* 103  CO2 24 23  GLUCOSE 105* 97  BUN 14 14  CREATININE 1.11* 1.19*  CALCIUM 9.7 9.3   Liver Function Tests:  Recent Labs  03/12/16 0430  AST 121*  ALT 23  ALKPHOS 115  BILITOT 0.8  PROT 7.7  ALBUMIN 3.6   CBC:  Recent Labs  03/13/16 0438 03/14/16 0225  WBC 7.3 7.2  NEUTROABS 4.1  --   HGB 9.3* 9.1*  HCT 28.7* 28.1*  MCV 89.7 89.8  PLT 223 215   Cardiac Enzymes:  Recent Labs  03/11/16 1226  TROPONINI 5.32*    Fasting Lipid Panel:  Recent Labs  03/12/16 0430  CHOL 144  HDL 49  LDLCALC 88  TRIG 35  CHOLHDL 2.9     Radiology/Studies:  No results found.  PHYSICAL EXAM General: Well developed, well nourished, female in no acute distress Head: Eyes PERRLA, No xanthomas. Normocephalic and atraumatic, oropharynx without edema or exudate.  Lungs: Resp regular and unlabored, CTA. Heart: Irregular no s3, s4, good mechanical MV click. Gr 2/6 systolic murmur at apex. Neck: No carotid bruits. No lymphadenopathy. + JVD. Abdomen: Bowel sounds present, abdomen soft and non-tender without masses or hernias noted. Msk: No spine or cva tenderness. No weakness, no joint deformities or effusions. Extremities: No clubbing, cyanosis or edema. DP/PT/Radials 2+ and equal bilaterally. Neuro: Alert and oriented X 3. No focal deficits noted. Psych: Good affect, responds appropriately Skin: No rashes or lesions noted.  ASSESSMENT AND PLAN: 1. NSTEMI. Troponin increased to 5.32. INR now under 2 for cath on monday. Continue metoprolol and cardizem. Continue statin therapy. Last cardiac cath 2004.  2. S/p MVR with [employer] Jude  prosthesis. On heparin now pending cath  3. 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 No overt right heart failure. 4. HTN controlled. 5. Chronic diastolic CHF. Weight stable. Continue oral lasix. Good diuresis  6. Atrial fibrillation- permanent. Rate controlled.  7. SSS s/p pacemaker 8. Hypokalemia resolved.  9. History of SAH in 2012.  Target INR 2-2.5  10. 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  11. Anemia. Follow CBC. Hct low but stable 28.1  Present on Admission:  . Atrial fibrillation with slow ventricular response ([place]) . HTN (hypertension) . Moderate to severe tricuspid regurgitation . NSTEMI (non-ST elevated myocardial infarction) ([place]) . Hyponatremia  Signed, Jenkins Rouge, [place] 03/14/2016 8:37 AM

## 2016-03-14 NOTE — Progress Notes (Signed)
PROGRESS NOTE    Carrie [name]  [id number]  DOB: 11/11/1940  DOA: 03/11/2016 PCP: Elyn Peers, MD Outpatient Specialists:   Hospital course: Carrie [name] is a 75 y.o. 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  The patient was in her normal health until tonight at midnight while watching TV when she had onset of left sided chest pressure. The pain was located in the left chest, radiating to the left arm. It was associated with nausea and "feeling very uncomfortable". It was constant and pressure like and unlike any sensation she had had before. Her husband called 9-1-1 and she was transported here. Pt for cardiac cath on 6/26.    Assessment & Plan:   1. Chest pain/NSTEMI The patient presented with HEART score of 6.Troponins have been elevated and planning for cath 6/26.  Pt reports resolution of chest pain.  Continue to follow and monitor closely.  IV heparin.  Nitropaste, metoprolol.  Further recommendations following cath.   2. HTN:  -metroprolol 12.5 BID  3. Afib and SSS with pacer:  CHADS2Vasc 3. On warfarin, INR goal 2.0-2.5 given hx of SAH. But holding warfarin for cath.  Need INR to be <1.8 - continue home diltiazem and metoprolol  4. Chronic diastolic CHF:  - restarted furosemide daily  5. Anemia:  Hg stable at 9.  Thrombocytopenia has resolved.  6. Hypokalemia: -Repleted orally  7. Hyponatremia - unchanged, stable, suspect this is secondary to thiazide and other diuretic use, monitoring for now, d/c chlorthalidone for now  Code Status: Full  Consults called: Cardiology Admission status: Telemetry, observation  Procedures:  Cath scheduled for 6/26  Subjective: Pt denies any complaints today.  Pt ambulating hallway.   Objective: Filed Vitals:   03/13/16 0449 03/13/16 1505 03/13/16 1925 03/14/16  0607  BP: 102/56 105/60 120/57 113/49  Pulse: 130 68 73 58  Temp: 98.5 F (36.9 C) 98.3 F (36.8 C) 98.5 F (36.9 C) 98.4 F (36.9 C)  TempSrc: Oral Oral Oral Oral  Resp: 18 16 16 16   Height:      Weight: 143 lb 11.2 oz (65.182 kg)   144 lb (65.318 kg)  SpO2: 98% 99% 99% 98%    Intake/Output Summary (Last 24 hours) at 03/14/16 1126 Last data filed at 03/14/16 0700  Gross per 24 hour  Intake 887.58 ml  Output   2550 ml  Net -1662.42 ml   Filed Weights   03/12/16 0430 03/13/16 0449 03/14/16 0607  Weight: 143 lb 1.3 oz (64.9 kg) 143 lb 11.2 oz (65.182 kg) 144 lb (65.318 kg)    Exam:  General appearance: Well-developed, elderly adult female, alert and in no acute distress. Skin: Warm and dry.  Cardiac: RRR, nl S1-S2, no murmurs appreciated, mechanical sounding valve. Capillary refill is brisk Respiratory: Normal respiratory rate and rhythm. CTAB without rales or wheezes. Abdomen: Abdomen soft without rigidity. No TTP.No ascites, distension.  MSK: No deformities or effusions. Pain not reproduced with palpation of precordium. No pain with arm movement. Neuro: Sensorium intact and responding to questions, attention normal. Speech is fluent. Moves all extremities equally and with normal coordination.  Psych: Behavior appropriate. Affect normal. No evidence of aural or visual hallucinations or delusions.   Data Reviewed: Basic Metabolic Panel:  Recent Labs Lab 03/11/16 0238 03/12/16 0430 03/13/16 0438  NA 132* 132* 132*  K  3.4* 4.4 4.9  CL 100* 100* 103  CO2 23 24 23   GLUCOSE 131* 105* 97  BUN 14 14 14   CREATININE 0.92 1.11* 1.19*  CALCIUM 9.8 9.7 9.3   Liver Function Tests:  Recent Labs Lab 03/12/16 0430  AST 121*  ALT 23  ALKPHOS 115  BILITOT 0.8  PROT 7.7  ALBUMIN 3.6   No results for input(s): LIPASE, AMYLASE in the last 168 hours. No results for input(s): AMMONIA in the last 168 hours. CBC:  Recent Labs Lab 03/11/16 0210  03/12/16 0430 03/13/16 0438 03/14/16 0225  WBC 5.9 8.1 7.3 7.2  NEUTROABS 4.0  --  4.1  --   HGB 11.0* 9.9* 9.3* 9.1*  HCT 32.8* 30.1* 28.7* 28.1*  MCV 90.6 91.8 89.7 89.8  PLT 203 213 223 215   Cardiac Enzymes:  Recent Labs Lab 03/11/16 0747 03/11/16 1226  TROPONINI 2.01* 5.32*   BNP (last 3 results) No results for input(s): PROBNP in the last 8760 hours. CBG: No results for input(s): GLUCAP in the last 168 hours.  No results found for this or any previous visit (from the past 240 hour(s)).   Studies: No results found.   Scheduled Meds: . atorvastatin  80 mg Oral q1800  . diltiazem  120 mg Oral Daily  . furosemide  20 mg Oral Daily  . metoprolol tartrate  12.5 mg Oral BID  . nitroGLYCERIN  1 inch Topical Q6H  . pantoprazole  40 mg Oral Daily  . potassium chloride  30 mEq Oral BID  . sodium chloride flush  3 mL Intravenous Q12H  . Warfarin - Pharmacist Dosing Inpatient   Does not apply q1800   Continuous Infusions: . sodium chloride 10 mL/hr at 03/12/16 0700  . heparin 550 Units/hr (03/14/16 1115)    Principal Problem:   NSTEMI (non-ST elevated myocardial infarction) ([place]) Active Problems:   Hyponatremia   S/P mitral valve replacement, St Jude   Chronic anticoagulation, ( INR goal 2.0-2.5 due to history of subdural hematoma and liver hemorrhage)   HTN (hypertension)   Atrial fibrillation with slow ventricular response (H[facility])   Moderate to severe tricuspid regurgitation  Time spent:   Irwin Brakeman, MD, FAAFP [facility] Pager [phone]  If 7PM-7AM, please contact night-coverage [email or web address] Password [email or web address] 03/14/2016, 11:26 AM    LOS: 3 days

## 2016-03-15 DIAGNOSIS — N179 Acute kidney failure, unspecified: Secondary | ICD-10-CM | POA: Insufficient documentation

## 2016-03-15 LAB — CBC
HCT: 27.8 % — ABNORMAL LOW (ref 36.0–46.0)
Hemoglobin: 9.3 g/dL — ABNORMAL LOW (ref 12.0–15.0)
MCH: 29.7 pg (ref 26.0–34.0)
MCHC: 33.5 g/dL (ref 30.0–36.0)
MCV: 88.8 fL (ref 78.0–100.0)
PLATELETS: 237 10*3/uL (ref 150–400)
RBC: 3.13 MIL/uL — AB (ref 3.87–5.11)
RDW: 18.6 % — ABNORMAL HIGH (ref 11.5–15.5)
WBC: 7.5 10*3/uL (ref 4.0–10.5)

## 2016-03-15 LAB — BASIC METABOLIC PANEL
ANION GAP: 9 (ref 5–15)
BUN: 24 mg/dL — ABNORMAL HIGH (ref 6–20)
CALCIUM: 10.1 mg/dL (ref 8.9–10.3)
CHLORIDE: 102 mmol/L (ref 101–111)
CO2: 22 mmol/L (ref 22–32)
CREATININE: 1.09 mg/dL — AB (ref 0.44–1.00)
GFR calc non Af Amer: 49 mL/min — ABNORMAL LOW (ref >60)
GFR, EST AFRICAN AMERICAN: 57 mL/min — AB (ref >60)
GLUCOSE: 98 mg/dL (ref 65–99)
Potassium: 4.5 mmol/L (ref 3.5–5.1)
Sodium: 133 mmol/L — ABNORMAL LOW (ref 135–145)

## 2016-03-15 LAB — PROTIME-INR
INR: 1.45 (ref 0.00–1.49)
PROTHROMBIN TIME: 17.7 s — AB (ref 11.6–15.2)

## 2016-03-15 LAB — HEPARIN LEVEL (UNFRACTIONATED): Heparin Unfractionated: 0.38 IU/mL (ref 0.30–0.70)

## 2016-03-15 MED ORDER — SODIUM CHLORIDE 0.9 % IV BOLUS (SEPSIS)
250.0000 mL | Freq: Once | INTRAVENOUS | Status: AC
  Administered 2016-03-15: 250 mL via INTRAVENOUS

## 2016-03-15 NOTE — Progress Notes (Signed)
Patient ID: Carrie [name], female   DOB: [birth year], 75 y.o.   MRN: [id number]   TELEMETRY: Reviewed telemetry pt in Atrial fibrillation with controlled rate 03/15/2016   Filed Vitals:   03/14/16 0607 03/14/16 1422 03/14/16 2126 03/15/16 0636  BP: 113/49 139/69 117/56 119/60  Pulse: 58 71 69 80  Temp: 98.4 F (36.9 C) 97.9 F (36.6 C) 97.7 F (36.5 C) 98.1 F (36.7 C)  TempSrc: Oral Oral Axillary Oral  Resp: 16 16 18 18   Height:      Weight: 144 lb (65.318 kg)   142 lb 12.8 oz (64.774 kg)  SpO2: 98% 99% 98% 99%    Intake/Output Summary (Last 24 hours) at 03/15/16 0903 Last data filed at 03/15/16 0644  Gross per 24 hour  Intake 413.36 ml  Output   3000 ml  Net -2586.64 ml   Filed Weights   03/13/16 0449 03/14/16 0607 03/15/16 0636  Weight: 143 lb 11.2 oz (65.182 kg) 144 lb (65.318 kg) 142 lb 12.8 oz (64.774 kg)    Subjective Feeling better today. No chest pain. Wants some good coffee   . atorvastatin  80 mg Oral q1800  . diltiazem  120 mg Oral Daily  . furosemide  20 mg Oral Daily  . metoprolol tartrate  12.5 mg Oral BID  . nitroGLYCERIN  1 inch Topical Q6H  . pantoprazole  40 mg Oral Daily  . potassium chloride  30 mEq Oral BID  . sodium chloride flush  3 mL Intravenous Q12H  . Warfarin - Pharmacist Dosing Inpatient   Does not apply q1800   . sodium chloride 10 mL/hr at 03/12/16 0700  . heparin 550 Units/hr (03/14/16 1115)    LABS: Basic Metabolic Panel:  Recent Labs  03/13/16 0438 03/15/16 0723  NA 132* 133*  K 4.9 4.5  CL 103 102  CO2 23 22  GLUCOSE 97 98  BUN 14 24*  CREATININE 1.19* 1.09*  CALCIUM 9.3 10.1   Liver Function Tests: No results for input(s): AST, ALT, ALKPHOS, BILITOT, PROT, ALBUMIN in the last 72 hours. CBC:  Recent Labs  03/13/16 0438 03/14/16 0225 03/15/16 0318  WBC 7.3 7.2 7.5  NEUTROABS 4.1  --   --   HGB 9.3* 9.1* 9.3*  HCT 28.7* 28.1* 27.8*  MCV 89.7 89.8 88.8  PLT 223 215 237   Cardiac Enzymes: No results for  input(s): CKTOTAL, CKMB, CKMBINDEX, TROPONINI in the last 72 hours. Fasting Lipid Panel: No results for input(s): CHOL, HDL, LDLCALC, TRIG, CHOLHDL, LDLDIRECT in the last 72 hours.   Radiology/Studies:  No results found.  PHYSICAL EXAM General: Well developed, well nourished, female in no acute distress Head: Eyes PERRLA, No xanthomas. Normocephalic and atraumatic, oropharynx without edema or exudate.  Lungs: Resp regular and unlabored, CTA. Heart: Irregular no s3, s4, good mechanical MV click. Gr 2/6 systolic murmur at apex. Neck: No carotid bruits. No lymphadenopathy. + JVD. Abdomen: Bowel sounds present, abdomen soft and non-tender without masses or hernias noted. Msk: No spine or cva tenderness. No weakness, no joint deformities or effusions. Extremities: No clubbing, cyanosis or edema. DP/PT/Radials 2+ and equal bilaterally. Neuro: Alert and oriented X 3. No focal deficits noted. Psych: Good affect, responds appropriately Skin: No rashes or lesions noted.  ASSESSMENT AND PLAN: 1. NSTEMI. Troponin increased to 5.32. INR now under 2 for cath on monday. Continue metoprolol and cardizem. Continue statin therapy. Last cardiac cath 2004.  2. 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 On heparin now pending  cath  3. 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 No overt right heart failure. Good diuresis  4. HTN controlled. 5. Chronic diastolic CHF. Weight stable. Continue oral lasix. Good diuresis  6. Atrial fibrillation- permanent. Rate controlled.  7. SSS s/p pacemaker 8. Hypokalemia resolved.  9. History of SAH in 2012.  Target INR 2-2.5  10. 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  11. Anemia. Follow CBC. Hct low but stable   Lab Results  Component Value Date   HCT 27.8* 03/15/2016     Present on Admission:  . Atrial fibrillation with slow ventricular response ([place]) . HTN (hypertension) . Moderate to severe tricuspid regurgitation . NSTEMI (non-ST  elevated myocardial infarction) ([place]) . Hyponatremia  Signed, Jenkins Rouge, [place] 03/15/2016 9:03 AM

## 2016-03-15 NOTE — Progress Notes (Signed)
PROGRESS NOTE    Carrie [name]  [id number]  DOB: [birth year]  DOA: 03/11/2016 PCP: Elyn Peers, MD Outpatient Specialists:   Hospital course: Carrie [name] is a 75 y.o. 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  The patient was in her normal health until tonight at midnight while watching TV when she had onset of left sided chest pressure. The pain was located in the left chest, radiating to the left arm. It was associated with nausea and "feeling very uncomfortable". It was constant and pressure like and unlike any sensation she had had before. Her husband called 9-1-1 and she was transported here. Pt for cardiac cath on 6/26.    Assessment & Plan:   1. Chest pain/NSTEMI The patient presented with HEART score of 6.Troponins have been elevated and planning for cath 6/26.  Pt reports resolution of chest pain.  Continue to follow and monitor closely.  IV heparin.  Nitropaste, metoprolol.  Further recommendations following cath.   2. HTN:  -metroprolol 12.5 BID  3. Afib and SSS with pacer:  CHADS2Vasc 3. On warfarin, INR goal 2.0-2.5 given hx of SAH. But holding warfarin for cath.  Need INR to be <1.8 - continue home diltiazem and metoprolol, she does have elevation of HR with ambulation, will monitor  4. Chronic diastolic CHF:  - restarted furosemide daily  5. Anemia:  Hg stable at 9.  Thrombocytopenia has resolved.  6. Hypokalemia: -Repleted orally  7. Hyponatremia - unchanged, stable, suspect this is secondary to thiazide and other diuretic use, monitoring for now, d/c chlorthalidone for now  8.  Renal Insufficiency - acute  - likely secondary to diuretic use, would hold today, bolus small amount of IVF, recheck in AM.   Code Status: Full  Consults called: Cardiology Admission status: Telemetry,  observation  Procedures:  Cath scheduled for 6/26  Subjective: Pt denies any complaints today. No chest pain, No SOB  Objective: Filed Vitals:   03/14/16 0607 03/14/16 1422 03/14/16 2126 03/15/16 0636  BP: 113/49 139/69 117/56 119/60  Pulse: 58 71 69 80  Temp: 98.4 F (36.9 C) 97.9 F (36.6 C) 97.7 F (36.5 C) 98.1 F (36.7 C)  TempSrc: Oral Oral Axillary Oral  Resp: 16 16 18 18   Height:      Weight: 144 lb (65.318 kg)   142 lb 12.8 oz (64.774 kg)  SpO2: 98% 99% 98% 99%    Intake/Output Summary (Last 24 hours) at 03/15/16 1214 Last data filed at 03/15/16 0644  Gross per 24 hour  Intake 413.36 ml  Output   3000 ml  Net -2586.64 ml   Filed Weights   03/13/16 0449 03/14/16 0607 03/15/16 0636  Weight: 143 lb 11.2 oz (65.182 kg) 144 lb (65.318 kg) 142 lb 12.8 oz (64.774 kg)    Exam:  General appearance: Well-developed, elderly adult female, alert and in no acute distress. Skin: Warm and dry.  Cardiac: RRR, nl S1-S2, no murmurs appreciated, mechanical sounding valve. Capillary refill is brisk Respiratory: Normal respiratory rate and rhythm. CTAB without rales or wheezes. Abdomen: Abdomen soft without rigidity. No TTP.No ascites, distension.  MSK: No deformities or effusions. Pain not reproduced with palpation of precordium. No pain with arm movement. Neuro: Sensorium intact and responding to questions, attention normal. Speech is fluent. Moves all extremities equally and with normal coordination.  Psych: Behavior  appropriate. Affect normal. No evidence of aural or visual hallucinations or delusions.   Data Reviewed: Basic Metabolic Panel:  Recent Labs Lab 03/11/16 0238 03/12/16 0430 03/13/16 0438 03/15/16 0723  NA 132* 132* 132* 133*  K 3.4* 4.4 4.9 4.5  CL 100* 100* 103 102  CO2 23 24 23 22   GLUCOSE 131* 105* 97 98  BUN 14 14 14  24*  CREATININE 0.92 1.11* 1.19* 1.09*  CALCIUM 9.8 9.7 9.3 10.1   Liver Function Tests:  Recent Labs Lab  03/12/16 0430  AST 121*  ALT 23  ALKPHOS 115  BILITOT 0.8  PROT 7.7  ALBUMIN 3.6   No results for input(s): LIPASE, AMYLASE in the last 168 hours. No results for input(s): AMMONIA in the last 168 hours. CBC:  Recent Labs Lab 03/11/16 0210 03/12/16 0430 03/13/16 0438 03/14/16 0225 03/15/16 0318  WBC 5.9 8.1 7.3 7.2 7.5  NEUTROABS 4.0  --  4.1  --   --   HGB 11.0* 9.9* 9.3* 9.1* 9.3*  HCT 32.8* 30.1* 28.7* 28.1* 27.8*  MCV 90.6 91.8 89.7 89.8 88.8  PLT 203 213 223 215 237   Cardiac Enzymes:  Recent Labs Lab 03/11/16 0747 03/11/16 1226  TROPONINI 2.01* 5.32*   BNP (last 3 results) No results for input(s): PROBNP in the last 8760 hours. CBG: No results for input(s): GLUCAP in the last 168 hours.  No results found for this or any previous visit (from the past 240 hour(s)).   Studies: No results found.   Scheduled Meds: . atorvastatin  80 mg Oral q1800  . diltiazem  120 mg Oral Daily  . furosemide  20 mg Oral Daily  . metoprolol tartrate  12.5 mg Oral BID  . nitroGLYCERIN  1 inch Topical Q6H  . pantoprazole  40 mg Oral Daily  . potassium chloride  30 mEq Oral BID  . sodium chloride flush  3 mL Intravenous Q12H  . Warfarin - Pharmacist Dosing Inpatient   Does not apply q1800   Continuous Infusions: . sodium chloride 10 mL/hr at 03/12/16 0700  . heparin 550 Units/hr (03/14/16 1115)    Principal Problem:   NSTEMI (non-ST elevated myocardial infarction) ([place]) Active Problems:   Hyponatremia   S/P mitral valve replacement, [employer] Jude   Chronic anticoagulation, ( INR goal 2.0-2.5 due to history of subdural hematoma and liver hemorrhage)   HTN (hypertension)   Atrial fibrillation with slow ventricular response ([facility])   Moderate to severe tricuspid regurgitation  Time spent:   Carrie Brakeman, MD, FAAFP [facility] Pager [phone]  If 7PM-7AM, please contact night-coverage [email or web address] Password [email or web address] 03/15/2016, 12:14 PM    LOS: 4 days

## 2016-03-15 NOTE — Progress Notes (Signed)
ANTICOAGULATION CONSULT NOTE - Follow Up Consult  Pharmacy Consult for Heparin  Indication: chest pain/ACS and atrial fibrillation, MVR  Allergies  Allergen Reactions  . Codeine Nausea And Vomiting    Patient Measurements: Height: 5\' 1"  (154.9 cm) Weight: 142 lb 12.8 oz (64.774 kg) IBW/kg (Calculated) : 47.8  Vital Signs: Temp: 98.1 F (36.7 C) (06/25 0636) Temp Source: Oral (06/25 0636) BP: 119/60 mmHg (06/25 0636) Pulse Rate: 80 (06/25 0636)  Labs:  Recent Labs  03/13/16 0438  03/13/16 1925 03/14/16 0225 03/15/16 0318  HGB 9.3*  --   --  9.1* 9.3*  HCT 28.7*  --   --  28.1* 27.8*  PLT 223  --   --  215 237  LABPROT 22.7*  --   --  20.8* 17.7*  INR 2.02*  --   --  1.80* 1.45  HEPARINUNFRC 0.49  < > 0.70 0.46 0.38  CREATININE 1.19*  --   --   --   --   < > = values in this interval not displayed.  Estimated Creatinine Clearance: 35.8 mL/min (by C-G formula based on Cr of 1.19).  Assessment: 75 year old female continues on heparin for chest pain / MVR in preparation for cath once INR appropriate. HL remains therapeutic at 0.38. Continuing to hold warfarin; INR 1.45. CBC now stable; H/H 9.3/27.8, plt 237. No overt bleeding noted.    PTA dose:  2.5 mg on Sundays, 5 mg AOD   Goal of Therapy:  INR 2-2.5 per outpatient anti-coag notes Monitor platelets by anticoagulation protocol: Yes  Heparin level = 0.3-0.7   Plan:  Continue heparin at 550 units / hr Daily heparin level, CBC Planning cath for 6/26  Stephens November, PharmD Clinical Pharmacy Resident 7:13 AM, 03/15/2016

## 2016-03-16 ENCOUNTER — Encounter (HOSPITAL_COMMUNITY)
Admission: EM | Disposition: A | Discharge status: Home Health | Payer: Self-pay | Source: Home / Self Care | Attending: Family Medicine

## 2016-03-16 ENCOUNTER — Encounter (HOSPITAL_COMMUNITY): Payer: Self-pay | Admitting: Interventional Cardiology

## 2016-03-16 HISTORY — PX: CARDIAC CATHETERIZATION: SHX172

## 2016-03-16 LAB — BASIC METABOLIC PANEL
ANION GAP: 7 (ref 5–15)
BUN: 25 mg/dL — ABNORMAL HIGH (ref 6–20)
CHLORIDE: 104 mmol/L (ref 101–111)
CO2: 21 mmol/L — ABNORMAL LOW (ref 22–32)
CREATININE: 1.12 mg/dL — AB (ref 0.44–1.00)
Calcium: 9.8 mg/dL (ref 8.9–10.3)
GFR calc non Af Amer: 47 mL/min — ABNORMAL LOW (ref >60)
GFR, EST AFRICAN AMERICAN: 55 mL/min — AB (ref >60)
Glucose, Bld: 99 mg/dL (ref 65–99)
POTASSIUM: 4.9 mmol/L (ref 3.5–5.1)
SODIUM: 132 mmol/L — AB (ref 135–145)

## 2016-03-16 LAB — CBC
HCT: 29.6 % — ABNORMAL LOW (ref 36.0–46.0)
HEMOGLOBIN: 9.7 g/dL — AB (ref 12.0–15.0)
MCH: 29.1 pg (ref 26.0–34.0)
MCHC: 32.8 g/dL (ref 30.0–36.0)
MCV: 88.9 fL (ref 78.0–100.0)
Platelets: 245 10*3/uL (ref 150–400)
RBC: 3.33 MIL/uL — AB (ref 3.87–5.11)
RDW: 18.4 % — ABNORMAL HIGH (ref 11.5–15.5)
WBC: 6.8 10*3/uL (ref 4.0–10.5)

## 2016-03-16 LAB — PROTIME-INR
INR: 1.28 (ref 0.00–1.49)
PROTHROMBIN TIME: 16.1 s — AB (ref 11.6–15.2)

## 2016-03-16 LAB — HEPARIN LEVEL (UNFRACTIONATED)
HEPARIN UNFRACTIONATED: 0.36 [IU]/mL (ref 0.30–0.70)
HEPARIN UNFRACTIONATED: 0.16 [IU]/mL — AB (ref 0.30–0.70)

## 2016-03-16 SURGERY — LEFT HEART CATH AND CORONARY ANGIOGRAPHY

## 2016-03-16 MED ORDER — SODIUM CHLORIDE 0.9 % IV SOLN: 250.0000 mL | INTRAVENOUS | Status: DC | PRN

## 2016-03-16 MED ORDER — VERAPAMIL HCL 2.5 MG/ML IV SOLN
INTRAVENOUS | Status: AC
  Filled 2016-03-16: qty 2

## 2016-03-16 MED ORDER — MIDAZOLAM HCL 2 MG/2ML IJ SOLN
INTRAMUSCULAR | Status: DC | PRN
  Administered 2016-03-16 (×2): 1 mg via INTRAVENOUS

## 2016-03-16 MED ORDER — FENTANYL CITRATE (PF) 100 MCG/2ML IJ SOLN
INTRAMUSCULAR | Status: AC
  Filled 2016-03-16: qty 2

## 2016-03-16 MED ORDER — HEPARIN (PORCINE) IN NACL 2-0.9 UNIT/ML-% IJ SOLN
INTRAMUSCULAR | Status: AC
  Filled 2016-03-16: qty 1000

## 2016-03-16 MED ORDER — HEPARIN (PORCINE) IN NACL 2-0.9 UNIT/ML-% IJ SOLN
INTRAMUSCULAR | Status: DC | PRN
  Administered 2016-03-16: 08:00:00 via INTRA_ARTERIAL

## 2016-03-16 MED ORDER — HEPARIN SODIUM (PORCINE) 1000 UNIT/ML IJ SOLN
INTRAMUSCULAR | Status: AC
  Filled 2016-03-16: qty 1

## 2016-03-16 MED ORDER — SODIUM CHLORIDE 0.9 % IV SOLN: INTRAVENOUS | Status: DC

## 2016-03-16 MED ORDER — MIDAZOLAM HCL 2 MG/2ML IJ SOLN
INTRAMUSCULAR | Status: AC
  Filled 2016-03-16: qty 2

## 2016-03-16 MED ORDER — ASPIRIN 81 MG PO CHEW
81.0000 mg | CHEWABLE_TABLET | ORAL | Status: AC
Start: 2016-03-16 — End: 2016-03-16
  Administered 2016-03-16: 81 mg via ORAL
  Filled 2016-03-16: qty 1

## 2016-03-16 MED ORDER — HEPARIN (PORCINE) IN NACL 100-0.45 UNIT/ML-% IJ SOLN
750.0000 [IU]/h | INTRAMUSCULAR | Status: DC
  Administered 2016-03-16: 550 [IU]/h via INTRAVENOUS
  Filled 2016-03-16: qty 250

## 2016-03-16 MED ORDER — SODIUM CHLORIDE 0.9% FLUSH: 3.0000 mL | INTRAVENOUS | Status: DC | PRN

## 2016-03-16 MED ORDER — WARFARIN SODIUM 5 MG PO TABS
5.0000 mg | ORAL_TABLET | Freq: Once | ORAL | Status: AC
  Administered 2016-03-16: 5 mg via ORAL
  Filled 2016-03-16: qty 1

## 2016-03-16 MED ORDER — HEPARIN BOLUS VIA INFUSION
1500.0000 [IU] | Freq: Once | INTRAVENOUS | Status: AC
  Administered 2016-03-16: 1500 [IU] via INTRAVENOUS
  Filled 2016-03-16: qty 1500

## 2016-03-16 MED ORDER — SODIUM CHLORIDE 0.9 % WEIGHT BASED INFUSION
1.0000 mL/kg/h | INTRAVENOUS | Status: AC
  Administered 2016-03-16: 1 mL/kg/h via INTRAVENOUS

## 2016-03-16 MED ORDER — SODIUM CHLORIDE 0.9% FLUSH
3.0000 mL | Freq: Two times a day (BID) | INTRAVENOUS | Status: DC
  Administered 2016-03-16 – 2016-03-22 (×8): 3 mL via INTRAVENOUS

## 2016-03-16 MED ORDER — IOPAMIDOL (ISOVUE-370) INJECTION 76%
INTRAVENOUS | Status: AC
  Filled 2016-03-16: qty 100

## 2016-03-16 MED ORDER — HEPARIN SODIUM (PORCINE) 1000 UNIT/ML IJ SOLN
INTRAMUSCULAR | Status: DC | PRN
  Administered 2016-03-16: 3000 [IU] via INTRAVENOUS

## 2016-03-16 MED ORDER — HEPARIN (PORCINE) IN NACL 2-0.9 UNIT/ML-% IJ SOLN
INTRAMUSCULAR | Status: AC
  Filled 2016-03-16: qty 500

## 2016-03-16 MED ORDER — ONDANSETRON HCL 4 MG/2ML IJ SOLN: 4.0000 mg | Freq: Four times a day (QID) | INTRAMUSCULAR | Status: DC | PRN

## 2016-03-16 MED ORDER — ACETAMINOPHEN 325 MG PO TABS: 650.0000 mg | ORAL_TABLET | ORAL | Status: DC | PRN

## 2016-03-16 MED ORDER — LIDOCAINE HCL (PF) 1 % IJ SOLN
INTRAMUSCULAR | Status: DC | PRN
  Administered 2016-03-16: 2 mL

## 2016-03-16 MED ORDER — LIDOCAINE HCL (PF) 1 % IJ SOLN
INTRAMUSCULAR | Status: AC
  Filled 2016-03-16: qty 30

## 2016-03-16 MED ORDER — FENTANYL CITRATE (PF) 100 MCG/2ML IJ SOLN
INTRAMUSCULAR | Status: DC | PRN
  Administered 2016-03-16 (×2): 25 ug via INTRAVENOUS

## 2016-03-16 MED ORDER — IOPAMIDOL (ISOVUE-370) INJECTION 76%
INTRAVENOUS | Status: DC | PRN
  Administered 2016-03-16: 70 mL via INTRA_ARTERIAL

## 2016-03-16 MED ORDER — SODIUM CHLORIDE 0.9% FLUSH: 3.0000 mL | Freq: Two times a day (BID) | INTRAVENOUS | Status: DC

## 2016-03-16 MED ORDER — HEPARIN (PORCINE) IN NACL 2-0.9 UNIT/ML-% IJ SOLN
INTRAMUSCULAR | Status: DC | PRN
  Administered 2016-03-16: 1500 mL

## 2016-03-16 SURGICAL SUPPLY — 12 items
CATH INFINITI 5 FR JL3.5 (CATHETERS) ×2 IMPLANT
CATH INFINITI 5FR ANG PIGTAIL (CATHETERS) ×2 IMPLANT
CATH INFINITI JR4 5F (CATHETERS) ×2 IMPLANT
DEVICE RAD COMP TR BAND LRG (VASCULAR PRODUCTS) ×2 IMPLANT
GLIDESHEATH SLEND SS 6F .021 (SHEATH) ×2 IMPLANT
KIT HEART LEFT (KITS) ×3 IMPLANT
PACK CARDIAC CATHETERIZATION (CUSTOM PROCEDURE TRAY) ×3 IMPLANT
SYR MEDRAD MARK V 150ML (SYRINGE) ×3 IMPLANT
TRANSDUCER W/STOPCOCK (MISCELLANEOUS) ×3 IMPLANT
TUBING CIL FLEX 10 FLL-RA (TUBING) ×3 IMPLANT
WIRE HI TORQ VERSACORE-J 145CM (WIRE) ×2 IMPLANT
WIRE SAFE-T 1.5MM-J .035X260CM (WIRE) ×2 IMPLANT

## 2016-03-16 NOTE — Progress Notes (Signed)
ANTICOAGULATION CONSULT NOTE - Follow Up Consult  Pharmacy Consult:  Heparin / Coumadin Indication:   Afib + St. Jude mechanical MVR  Allergies  Allergen Reactions  . Codeine Nausea And Vomiting    Patient Measurements: Height: 5\' 1"  (154.9 cm) Weight: 142 lb 9.6 oz (64.683 kg) IBW/kg (Calculated) : 47.8 Heparin Dosing Weight: 65 kg  Vital Signs: Temp: 98.8 F (37.1 C) (06/26 0500) Temp Source: Oral (06/26 0500) BP: 114/71 mmHg (06/26 0818) Pulse Rate: 0 (06/26 0823)  Labs:  Recent Labs  03/14/16 0225 03/15/16 0318 03/15/16 0723 03/16/16 0533  HGB 9.1* 9.3*  --  9.7*  HCT 28.1* 27.8*  --  29.6*  PLT 215 237  --  245  LABPROT 20.8* 17.7*  --  16.1*  INR 1.80* 1.45  --  1.28  HEPARINUNFRC 0.46 0.38  --  0.36  CREATININE  --   --  1.09* 1.12*    Estimated Creatinine Clearance: 38 mL/min (by C-G formula based on Cr of 1.12).     Assessment: 68 YOM with history of Afib and St. Jude mechanical MVR on Coumadin PTA with a lower INR goal due to history of SDH and liver hemorrhage.  She was transitioned to IV heparin on admit for cath.  Now s/p cath and to resume Coumadin and IV heparin and per Dr. Irish Lack.  Heparin level was therapeutic this AM prior to cath.  Patient's INR is sub-therapeutic as expected.  Noted she received Vitamin K on 03/12/16.  No bleeding reported.   Goal of Therapy:  Heparin level 0.3-0.7 units/ml  INR 2 - 2.5 per Cards Monitor platelets by anticoagulation protocol: Yes    Plan:  - At 1430, resume heparin gtt at 550 units/hr - Check 8 hr HL - Coumadin 5mg  PO today - Daily HL / CBC / PT / INR - Consider holding/reducing KCL while Lasix is on hold    Azai Gaffin D. Mina Marble, PharmD, BCPS Pager:  [phone] 03/16/2016, 9:33 AM

## 2016-03-16 NOTE — Progress Notes (Signed)
ANTICOAGULATION CONSULT NOTE - Follow Up Consult  Pharmacy Consult:  Heparin / Coumadin Indication:   Afib + St. Jude mechanical MVR  Allergies  Allergen Reactions  . Codeine Nausea And Vomiting    Patient Measurements: Height: 5\' 1"  (154.9 cm) Weight: 142 lb 9.6 oz (64.683 kg) IBW/kg (Calculated) : 47.8 Heparin Dosing Weight: 65 kg  Vital Signs: Temp: 98.6 F (37 C) (06/26 2044) Temp Source: Oral (06/26 2044) BP: 115/62 mmHg (06/26 2044) Pulse Rate: 76 (06/26 2044)  Labs:  Recent Labs  03/14/16 0225 03/15/16 0318 03/15/16 0723 03/16/16 0533 03/16/16 2212  HGB 9.1* 9.3*  --  9.7*  --   HCT 28.1* 27.8*  --  29.6*  --   PLT 215 237  --  245  --   LABPROT 20.8* 17.7*  --  16.1*  --   INR 1.80* 1.45  --  1.28  --   HEPARINUNFRC 0.46 0.38  --  0.36 0.16*  CREATININE  --   --  1.09* 1.12*  --     Estimated Creatinine Clearance: 38 mL/min (by C-G formula based on Cr of 1.12).     Assessment: 22 YOM with history of Afib and St. Jude mechanical MVR on Coumadin PTA with a lower INR goal due to history of SDH and liver hemorrhage.  She was transitioned to IV heparin on admit for cath.  Now s/p cath and to resume Coumadin and IV heparin and per Dr. Irish Lack.  Heparin level was therapeutic this AM prior to cath.  Patient's INR is sub-therapeutic as expected.  Noted she received Vitamin K on 03/12/16.  No bleeding reported. Her heparin was resumed at 1441 at rate of 550 units/hr and HL drawn at 2212 is low at 0.16.  Per RN heparin is infusing with no problems, no bleeding reported.    Goal of Therapy:  Heparin level 0.3-0.7 units/ml  INR 2 - 2.5 per Cards Monitor platelets by anticoagulation protocol: Yes    Plan:  - bolus 1500 units and increase drip rate to 750 units/hr and check 8 hr HL - Coumadin 5mg  PO was given today at 1746 - Daily HL / CBC / PT / INR  Eudelia Bunch, Pharm.D. BP:[phone] 03/16/2016 11:50 PM

## 2016-03-16 NOTE — Care Management Important Message (Signed)
Important Message  Patient Details  Name: Carrie [name] MRN: [id number] Date of Birth: 19-May-1941   Medicare Important Message Given:  Yes    Nathen May 03/16/2016, 10:45 AM

## 2016-03-16 NOTE — H&P (View-Only) (Signed)
Patient ID: Carrie [name], female   DOB: 1941/09/20, 75 y.o.   MRN: [id number]   TELEMETRY: Reviewed telemetry pt in Atrial fibrillation with controlled rate 03/15/2016   Filed Vitals:   03/14/16 0607 03/14/16 1422 03/14/16 2126 03/15/16 0636  BP: 113/49 139/69 117/56 119/60  Pulse: 58 71 69 80  Temp: 98.4 F (36.9 C) 97.9 F (36.6 C) 97.7 F (36.5 C) 98.1 F (36.7 C)  TempSrc: Oral Oral Axillary Oral  Resp: 16 16 18 18   Height:      Weight: 144 lb (65.318 kg)   142 lb 12.8 oz (64.774 kg)  SpO2: 98% 99% 98% 99%    Intake/Output Summary (Last 24 hours) at 03/15/16 0903 Last data filed at 03/15/16 0644  Gross per 24 hour  Intake 413.36 ml  Output   3000 ml  Net -2586.64 ml   Filed Weights   03/13/16 0449 03/14/16 0607 03/15/16 0636  Weight: 143 lb 11.2 oz (65.182 kg) 144 lb (65.318 kg) 142 lb 12.8 oz (64.774 kg)    Subjective Feeling better today. No chest pain. Wants some good coffee   . atorvastatin  80 mg Oral q1800  . diltiazem  120 mg Oral Daily  . furosemide  20 mg Oral Daily  . metoprolol tartrate  12.5 mg Oral BID  . nitroGLYCERIN  1 inch Topical Q6H  . pantoprazole  40 mg Oral Daily  . potassium chloride  30 mEq Oral BID  . sodium chloride flush  3 mL Intravenous Q12H  . Warfarin - Pharmacist Dosing Inpatient   Does not apply q1800   . sodium chloride 10 mL/hr at 03/12/16 0700  . heparin 550 Units/hr (03/14/16 1115)    LABS: Basic Metabolic Panel:  Recent Labs  03/13/16 0438 03/15/16 0723  NA 132* 133*  K 4.9 4.5  CL 103 102  CO2 23 22  GLUCOSE 97 98  BUN 14 24*  CREATININE 1.19* 1.09*  CALCIUM 9.3 10.1   Liver Function Tests: No results for input(s): AST, ALT, ALKPHOS, BILITOT, PROT, ALBUMIN in the last 72 hours. CBC:  Recent Labs  03/13/16 0438 03/14/16 0225 03/15/16 0318  WBC 7.3 7.2 7.5  NEUTROABS 4.1  --   --   HGB 9.3* 9.1* 9.3*  HCT 28.7* 28.1* 27.8*  MCV 89.7 89.8 88.8  PLT 223 215 237   Cardiac Enzymes: No results for  input(s): CKTOTAL, CKMB, CKMBINDEX, TROPONINI in the last 72 hours. Fasting Lipid Panel: No results for input(s): CHOL, HDL, LDLCALC, TRIG, CHOLHDL, LDLDIRECT in the last 72 hours.   Radiology/Studies:  No results found.  PHYSICAL EXAM General: Well developed, well nourished, female in no acute distress Head: Eyes PERRLA, No xanthomas. Normocephalic and atraumatic, oropharynx without edema or exudate.  Lungs: Resp regular and unlabored, CTA. Heart: Irregular no s3, s4, good mechanical MV click. Gr 2/6 systolic murmur at apex. Neck: No carotid bruits. No lymphadenopathy. + JVD. Abdomen: Bowel sounds present, abdomen soft and non-tender without masses or hernias noted. Msk: No spine or cva tenderness. No weakness, no joint deformities or effusions. Extremities: No clubbing, cyanosis or edema. DP/PT/Radials 2+ and equal bilaterally. Neuro: Alert and oriented X 3. No focal deficits noted. Psych: Good affect, responds appropriately Skin: No rashes or lesions noted.  ASSESSMENT AND PLAN: 1. NSTEMI. Troponin increased to 5.32. INR now under 2 for cath on monday. Continue metoprolol and cardizem. Continue statin therapy. Last cardiac cath 2004.  2. 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 On heparin now pending  cath  3. 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 No overt right heart failure. Good diuresis  4. HTN controlled. 5. Chronic diastolic CHF. Weight stable. Continue oral lasix. Good diuresis  6. Atrial fibrillation- permanent. Rate controlled.  7. SSS s/p pacemaker 8. Hypokalemia resolved.  9. History of SAH in 2012.  Target INR 2-2.5  10. 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  11. Anemia. Follow CBC. Hct low but stable   Lab Results  Component Value Date   HCT 27.8* 03/15/2016     Present on Admission:  . Atrial fibrillation with slow ventricular response ([place]) . HTN (hypertension) . Moderate to severe tricuspid regurgitation . NSTEMI (non-ST  elevated myocardial infarction) ([place]) . Hyponatremia  Signed, Jenkins Rouge, [place] 03/15/2016 9:03 AM

## 2016-03-16 NOTE — Progress Notes (Signed)
Pt TR band is off. It is a level 1 with minimal bruising

## 2016-03-16 NOTE — Interval H&P Note (Signed)
Cath Lab Visit (complete for each Cath Lab visit)  Clinical Evaluation Leading to the Procedure:   ACS: Yes.    Non-ACS:    Anginal Classification: CCS IV  Anti-ischemic medical therapy: Minimal Therapy (1 class of medications)  Non-Invasive Test Results: No non-invasive testing performed  Prior CABG: No previous CABG      History and Physical Interval Note:  03/16/2016 7:48 AM  Carrie [name]  has presented today for surgery, with the diagnosis of n stemi  The various methods of treatment have been discussed with the patient and family. After consideration of risks, benefits and other options for treatment, the patient has consented to  Procedure(s): Left Heart Cath and Coronary Angiography (N/A) as a surgical intervention .  The patient's history has been reviewed, patient examined, no change in status, stable for surgery.  I have reviewed the patient's chart and labs.  Questions were answered to the patient's satisfaction.     Larae Grooms

## 2016-03-16 NOTE — Progress Notes (Signed)
PROGRESS NOTE    Carrie [name]  [id number]  DOB: 02/08/1941  DOA: 03/11/2016 PCP: Elyn Peers, MD Outpatient Specialists:   Hospital course: Carrie [name] is a 75 y.o. 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  The patient was in her normal health until tonight at midnight while watching TV when she had onset of left sided chest pressure. The pain was located in the left chest, radiating to the left arm. It was associated with nausea and "feeling very uncomfortable". It was constant and pressure like and unlike any sensation she had had before. Her husband called 9-1-1 and she was transported here. Pt for cardiac cath on 6/26.    Assessment & Plan:   1. Chest pain/NSTEMI The patient presented with HEART score of 6.Troponins have been elevated and planning for cath 6/26.  Pt reports resolution of chest pain.  Continue to follow and monitor closely.  IV heparin.  Nitropaste, metoprolol.  Further recommendations following cath.   2. HTN:  -metroprolol 12.5 BID  3. Afib and SSS with pacer:  CHADS2Vasc 3. On warfarin, INR goal 2.0-2.5 given hx of SAH. But holding warfarin for cath.  Need INR to be <1.8 - continue home diltiazem and metoprolol, she does have elevation of HR with ambulation, will monitor  4. Chronic diastolic CHF:  - clinically appears compensated, holding lasix for now - see below.   5. Anemia:  Hg stable at 9.  Thrombocytopenia has resolved.  6. Hypokalemia: -Repleted orally  7. Hyponatremia - unchanged, stable, suspect this is secondary to thiazide and lasix, holding both for now.   8.  Renal Insufficiency - acute  - likely secondary to diuretic use, will hold lasix for now, bolused small amount of IVF on 6/24.  Follow BMP.    Code Status: Full  Consults called: Cardiology Admission status:  Telemetry, observation  Procedures:  Cath scheduled for 6/26  Subjective: Pt denies any complaints today.  Going to cath this morning.    Objective: Filed Vitals:   03/15/16 0636 03/15/16 1500 03/15/16 2024 03/16/16 0500  BP: 119/60 112/75 136/77 116/65  Pulse: 80 66 83 76  Temp: 98.1 F (36.7 C) 98 F (36.7 C) 98.6 F (37 C) 98.8 F (37.1 C)  TempSrc: Oral Oral Oral Oral  Resp: 18 18 20 18   Height:      Weight: 142 lb 12.8 oz (64.774 kg)   142 lb 9.6 oz (64.683 kg)  SpO2: 99% 95% 99% 96%    Intake/Output Summary (Last 24 hours) at 03/16/16 0728 Last data filed at 03/16/16 0718  Gross per 24 hour  Intake 924.14 ml  Output   2800 ml  Net -1875.86 ml   Filed Weights   03/14/16 0607 03/15/16 0636 03/16/16 0500  Weight: 144 lb (65.318 kg) 142 lb 12.8 oz (64.774 kg) 142 lb 9.6 oz (64.683 kg)    Exam:  General appearance: Well-developed, elderly adult female, alert and in no acute distress. Skin: Warm and dry.  Cardiac: RRR, nl S1-S2, no murmurs appreciated, mechanical sounding valve. Capillary refill is brisk Respiratory: Normal respiratory rate and rhythm. CTAB without rales or wheezes. Abdomen: Abdomen soft without rigidity. No TTP.No ascites, distension.  MSK: No deformities or effusions. Pain not reproduced with palpation of precordium. No pain with arm movement. Neuro: Sensorium intact and responding to questions, attention normal.  Speech is fluent. Moves all extremities equally and with normal coordination.  Psych: Behavior appropriate. Affect normal. No evidence of aural or visual hallucinations or delusions.   Data Reviewed: Basic Metabolic Panel:  Recent Labs Lab 03/11/16 0238 03/12/16 0430 03/13/16 0438 03/15/16 0723 03/16/16 0533  NA 132* 132* 132* 133* 132*  K 3.4* 4.4 4.9 4.5 4.9  CL 100* 100* 103 102 104  CO2 23 24 23 22  21*  GLUCOSE 131* 105* 97 98 99  BUN 14 14 14  24* 25*  CREATININE 0.92 1.11* 1.19* 1.09* 1.12*  CALCIUM  9.8 9.7 9.3 10.1 9.8   Liver Function Tests:  Recent Labs Lab 03/12/16 0430  AST 121*  ALT 23  ALKPHOS 115  BILITOT 0.8  PROT 7.7  ALBUMIN 3.6   No results for input(s): LIPASE, AMYLASE in the last 168 hours. No results for input(s): AMMONIA in the last 168 hours. CBC:  Recent Labs Lab 03/11/16 0210 03/12/16 0430 03/13/16 0438 03/14/16 0225 03/15/16 0318 03/16/16 0533  WBC 5.9 8.1 7.3 7.2 7.5 6.8  NEUTROABS 4.0  --  4.1  --   --   --   HGB 11.0* 9.9* 9.3* 9.1* 9.3* 9.7*  HCT 32.8* 30.1* 28.7* 28.1* 27.8* 29.6*  MCV 90.6 91.8 89.7 89.8 88.8 88.9  PLT 203 213 223 215 237 245   Cardiac Enzymes:  Recent Labs Lab 03/11/16 0747 03/11/16 1226  TROPONINI 2.01* 5.32*   BNP (last 3 results) No results for input(s): PROBNP in the last 8760 hours. CBG: No results for input(s): GLUCAP in the last 168 hours.  No results found for this or any previous visit (from the past 240 hour(s)).   Studies: No results found.   Scheduled Meds: . [MAR Hold] atorvastatin  80 mg Oral q1800  . [MAR Hold] diltiazem  120 mg Oral Daily  . [MAR Hold] metoprolol tartrate  12.5 mg Oral BID  . [MAR Hold] nitroGLYCERIN  1 inch Topical Q6H  . [MAR Hold] pantoprazole  40 mg Oral Daily  . [MAR Hold] potassium chloride  30 mEq Oral BID  . sodium chloride flush  3 mL Intravenous Q12H  . [MAR Hold] Warfarin - Pharmacist Dosing Inpatient   Does not apply q1800   Continuous Infusions: . sodium chloride 10 mL/hr at 03/16/16 0519  . heparin Stopped (03/16/16 [id number])    Principal Problem:   NSTEMI (non-ST elevated myocardial infarction) ([place]) Active Problems:   Hyponatremia   S/P mitral valve replacement, [employer] Jude   Chronic anticoagulation, ( INR goal 2.0-2.5 due to history of subdural hematoma and liver hemorrhage)   HTN (hypertension)   Atrial fibrillation with slow ventricular response ([facility])   Moderate to severe tricuspid regurgitation   AKI (acute kidney injury) ([place])  Time spent:    Irwin Brakeman, MD, FAAFP [facility] Pager [phone]  If 7PM-7AM, please contact night-coverage [email or web address] Password [email or web address] 03/16/2016, 7:28 AM    LOS: 5 days

## 2016-03-16 NOTE — Care Management Note (Addendum)
Case Management Note  Patient Details  Name: Carrie [name] MRN: [id number] Date of Birth: October 30, 1940  Subjective/Objective:     Pt presented with chest pain- Nstemi. Post cardiac cath- clean that will be medically managed. Pt is from home with husband. Plan to return home once stable. Pt with hx of Mitral Valve replacement. Plan for Heparin Coumadin [place] prior to d/c.                 Action/Plan: No needs identified from CM at this time. CM will continue to monitor.   Expected Discharge Date:  03/14/16               Expected Discharge Plan:  Home/Self Care  In-House Referral:  NA  Discharge planning Services  CM Consult  Post Acute Care Choice:  NA Choice offered to:  NA  DME Arranged:  N/A DME Agency:  NA  HH Arranged:  NA HH Agency:  NA  Status of Service:  Completed, signed off  If discussed at [place] of Stay Meetings, dates discussed:    Additional Comments: 1432 03-17-16 Jacqlyn Krauss, RN , BSN [phone] Awaiting therapeutic INR ( INR goal 2.0-2.5 due to history of subdural hematoma and liver hemorrhage) Plan will be for home once stable. No further needs from CM at this time.   Bethena Roys, RN 03/16/2016, 2:20 PM

## 2016-03-16 NOTE — Discharge Instructions (Addendum)
Information on my medicine - Coumadin   (Warfarin)  This medication education was reviewed with me or my healthcare representative as part of my discharge preparation.  The pharmacist that spoke with me during my hospital stay was:  Saundra Shelling, [facility]  Why was Coumadin prescribed for you? Coumadin was prescribed for you because you have a blood clot or a medical condition that can cause an increased risk of forming blood clots. Blood clots can cause serious health problems by blocking the flow of blood to the heart, lung, or brain. Coumadin can prevent harmful blood clots from forming. As a reminder your indication for Coumadin is:   Stroke Prevention Because Of Atrial Fibrillation, heart valve  What test will check on my response to Coumadin? While on Coumadin (warfarin) you will need to have an INR test regularly to ensure that your dose is keeping you in the desired range. The INR (international normalized ratio) number is calculated from the result of the laboratory test called prothrombin time (PT).  If an INR APPOINTMENT HAS NOT ALREADY BEEN MADE FOR YOU please schedule an appointment to have this lab work done by your health care provider within 7 days. Your INR goal is 2-2.5.  Ask your health care provider during an office visit what your goal INR is.  What  do you need to  know  About  COUMADIN? Take Coumadin (warfarin) exactly as prescribed by your healthcare provider about the same time each day.  DO NOT stop taking without talking to the doctor who prescribed the medication.  Stopping without other blood clot prevention medication to take the place of Coumadin may increase your risk of developing a new clot or stroke.  Get refills before you run out.  What do you do if you miss a dose? If you miss a dose, take it as soon as you remember on the same day then continue your regularly scheduled regimen the next day.  Do not take two doses of Coumadin at the same time.  Important Safety  Information A possible side effect of Coumadin (Warfarin) is an increased risk of bleeding. You should call your healthcare provider right away if you experience any of the following: ? Bleeding from an injury or your nose that does not stop. ? Unusual colored urine (red or dark brown) or unusual colored stools (red or black). ? Unusual bruising for unknown reasons. ? A serious fall or if you hit your head (even if there is no bleeding).  Some foods or medicines interact with Coumadin (warfarin) and might alter your response to warfarin. To help avoid this: ? Eat a balanced diet, maintaining a consistent amount of Vitamin K. ? Notify your provider about major diet changes you plan to make. ? Avoid alcohol or limit your intake to 1 drink for women and 2 drinks for men per day. (1 drink is 5 oz. wine, 12 oz. beer, or 1.5 oz. liquor.)  Make sure that ANY health care provider who prescribes medication for you knows that you are taking Coumadin (warfarin).  Also make sure the healthcare provider who is monitoring your Coumadin knows when you have started a new medication including herbals and non-prescription products.  Coumadin (Warfarin)  Major Drug Interactions  Increased Warfarin Effect Decreased Warfarin Effect  Alcohol (large quantities) Antibiotics (esp. Septra/Bactrim, Flagyl, Cipro) Amiodarone (Cordarone) Aspirin (ASA) Cimetidine (Tagamet) Megestrol (Megace) NSAIDs (ibuprofen, naproxen, etc.) Piroxicam (Feldene) Propafenone (Rythmol SR) Propranolol (Inderal) Isoniazid (INH) Posaconazole (Noxafil) Barbiturates (Phenobarbital) Carbamazepine (Tegretol)  Chlordiazepoxide (Librium) Cholestyramine (Questran) Griseofulvin Oral Contraceptives Rifampin Sucralfate (Carafate) Vitamin K   Coumadin (Warfarin) Major Herbal Interactions  Increased Warfarin Effect Decreased Warfarin Effect  Garlic Ginseng Ginkgo biloba Coenzyme Q10 Green tea St. Johns wort    Coumadin (Warfarin)  FOOD Interactions  Eat a consistent number of servings per week of foods HIGH in Vitamin K (1 serving =  cup)  Collards (cooked, or boiled & drained) Kale (cooked, or boiled & drained) Mustard greens (cooked, or boiled & drained) Parsley *serving size only =  cup Spinach (cooked, or boiled & drained) Swiss chard (cooked, or boiled & drained) Turnip greens (cooked, or boiled & drained)  Eat a consistent number of servings per week of foods MEDIUM-HIGH in Vitamin K (1 serving = 1 cup)  Asparagus (cooked, or boiled & drained) Broccoli (cooked, boiled & drained, or raw & chopped) Brussel sprouts (cooked, or boiled & drained) *serving size only =  cup Lettuce, raw (green leaf, endive, romaine) Spinach, raw Turnip greens, raw & chopped   These websites have more information on Coumadin (warfarin):  FailFactory.se; VeganReport.com.au;   Radial Site Care Refer to this sheet in the next few weeks. These instructions provide you with information about caring for yourself after your procedure. Your health care provider may also give you more specific instructions. Your treatment has been planned according to current medical practices, but problems sometimes occur. Call your health care provider if you have any problems or questions after your procedure. WHAT TO EXPECT AFTER THE PROCEDURE After your procedure, it is typical to have the following:  Bruising at the radial site that usually fades within 1-2 weeks.  Blood collecting in the tissue (hematoma) that may be painful to the touch. It should usually decrease in size and tenderness within 1-2 weeks. HOME CARE INSTRUCTIONS  Take medicines only as directed by your health care provider.  You may shower 24-48 hours after the procedure or as directed by your health care provider. Remove the bandage (dressing) and gently wash the site with plain soap and water. Pat the area dry with a clean towel. Do not rub the site,  because this may cause bleeding.  Do not take baths, swim, or use a hot tub until your health care provider approves.  Check your insertion site every day for redness, swelling, or drainage.  Do not apply powder or lotion to the site.  Do not flex or bend the affected arm for 24 hours or as directed by your health care provider.  Do not push or pull heavy objects with the affected arm for 24 hours or as directed by your health care provider.  Do not lift over 10 lb (4.5 kg) for 5 days after your procedure or as directed by your health care provider.  Ask your health care provider when it is okay to:  Return to work or school.  Resume usual physical activities or sports.  Resume sexual activity.  Do not drive home if you are discharged the same day as the procedure. Have someone else drive you.  You may drive 24 hours after the procedure unless otherwise instructed by your health care provider.  Do not operate machinery or power tools for 24 hours after the procedure.  If your procedure was done as an outpatient procedure, which means that you went home the same day as your procedure, a responsible adult should be with you for the first 24 hours after you arrive home.  Keep all follow-up visits as  directed by your health care provider. This is important. SEEK MEDICAL CARE IF:  You have a fever.  You have chills.  You have increased bleeding from the radial site. Hold pressure on the site. SEEK IMMEDIATE MEDICAL CARE IF:  You have unusual pain at the radial site.  You have redness, warmth, or swelling at the radial site.  You have drainage (other than a small amount of blood on the dressing) from the radial site.  The radial site is bleeding, and the bleeding does not stop after 30 minutes of holding steady pressure on the site.  Your arm or hand becomes pale, cool, tingly, or numb.   This information is not intended to replace advice given to you by your health care  provider. Make sure you discuss any questions you have with your health care provider.   Document Released: 10/10/2010 Document Revised: 09/28/2014 Document Reviewed: 03/26/2014 Elsevier Interactive Patient Education 2016 [place].   Heart-Healthy Eating Plan Many factors influence your heart health, including eating and exercise habits. Heart (coronary) risk increases with abnormal blood fat (lipid) levels. Heart-healthy meal planning includes limiting unhealthy fats, increasing healthy fats, and making other small dietary changes. This includes maintaining a healthy body weight to help keep lipid levels within a normal range. WHAT IS MY PLAN?  Your health care provider recommends that you:  Get no more than _________% of the total calories in your daily diet from fat.  Limit your intake of saturated fat to less than _________% of your total calories each day.  Limit the amount of cholesterol in your diet to less than _________ mg per day. WHAT TYPES OF FAT SHOULD I CHOOSE?  Choose healthy fats more often. Choose monounsaturated and polyunsaturated fats, such as olive oil and canola oil, flaxseeds, walnuts, almonds, and seeds.  Eat more omega-3 fats. Good choices include salmon, mackerel, sardines, tuna, flaxseed oil, and ground flaxseeds. Aim to eat fish at least two times each week.  Limit saturated fats. Saturated fats are primarily found in animal products, such as meats, butter, and cream. Plant sources of saturated fats include palm oil, palm kernel oil, and coconut oil.  Avoid foods with partially hydrogenated oils in them. These contain trans fats. Examples of foods that contain trans fats are stick margarine, some tub margarines, cookies, crackers, and other baked goods. WHAT GENERAL GUIDELINES DO I NEED TO FOLLOW?  Check food labels carefully to identify foods with trans fats or high amounts of saturated fat.  Fill one half of your plate with vegetables and green salads.  Eat 4-5 servings of vegetables per day. A serving of vegetables equals 1 cup of raw leafy vegetables,  cup of raw or cooked cut-up vegetables, or  cup of vegetable juice.  Fill one fourth of your plate with whole grains. Look for the word "whole" as the first word in the ingredient list.  Fill one fourth of your plate with lean protein foods.  Eat 4-5 servings of fruit per day. A serving of fruit equals one medium whole fruit,  cup of dried fruit,  cup of fresh, frozen, or canned fruit, or  cup of 100% fruit juice.  Eat more foods that contain soluble fiber. Examples of foods that contain this type of fiber are apples, broccoli, carrots, beans, peas, and barley. Aim to get 20-30 g of fiber per day.  Eat more home-cooked food and less restaurant, buffet, and fast food.  Limit or avoid alcohol.  Limit foods that are high  in starch and sugar.  Avoid fried foods.  Cook foods by using methods other than frying. Baking, boiling, grilling, and broiling are all great options. Other fat-reducing suggestions include:  Removing the skin from poultry.  Removing all visible fats from meats.  Skimming the fat off of stews, soups, and gravies before serving them.  Steaming vegetables in water or broth.  Lose weight if you are overweight. Losing just 5-10% of your initial body weight can help your overall health and prevent diseases such as diabetes and heart disease.  Increase your consumption of nuts, legumes, and seeds to 4-5 servings per week. One serving of dried beans or legumes equals  cup after being cooked, one serving of nuts equals 1 ounces, and one serving of seeds equals  ounce or 1 tablespoon.  You may need to monitor your salt (sodium) intake, especially if you have high blood pressure. Talk with your health care provider or dietitian to get more information about reducing sodium. WHAT FOODS CAN I EAT? Grains Breads, including [place], white, pita, wheat, raisin, rye,  oatmeal, and [place]. Tortillas that are neither fried nor made with lard or trans fat. Low-fat rolls, including hotdog and hamburger buns and English muffins. Biscuits. Muffins. Waffles. Pancakes. Light popcorn. Whole-grain cereals. Flatbread. Melba toast. Pretzels. Breadsticks. Rusks. Low-fat snacks and crackers, including oyster, saltine, matzo, graham, animal, and rye. Rice and pasta, including brown rice and those that are made with whole wheat. Vegetables All vegetables. Fruits All fruits, but limit coconut. Meats and Other Protein Sources Lean, well-trimmed beef, veal, pork, and lamb. Chicken and [place] without skin. All fish and shellfish. Wild duck, rabbit, pheasant, and venison. Egg whites or low-cholesterol egg substitutes. Dried beans, peas, lentils, and tofu.Seeds and most nuts. Dairy Low-fat or nonfat cheeses, including ricotta, string, and mozzarella. Skim or 1% milk that is liquid, powdered, or evaporated. Buttermilk that is made with low-fat milk. Nonfat or low-fat yogurt. Beverages Mineral water. Diet carbonated beverages. Sweets and Desserts Sherbets and fruit ices. Honey, jam, marmalade, jelly, and syrups. Meringues and gelatins. Pure sugar candy, such as hard candy, jelly beans, gumdrops, mints, marshmallows, and small amounts of dark chocolate. [employer] Eat all sweets and desserts in moderation. Fats and Oils Nonhydrogenated (trans-free) margarines. Vegetable oils, including soybean, sesame, sunflower, olive, peanut, safflower, corn, canola, and cottonseed. Salad dressings or mayonnaise that are made with a vegetable oil. Limit added fats and oils that you use for cooking, baking, salads, and as spreads. Other Cocoa powder. Coffee and tea. All seasonings and condiments. The items listed above may not be a complete list of recommended foods or beverages. Contact your dietitian for more options. WHAT FOODS ARE NOT RECOMMENDED? Grains Breads that are made with  saturated or trans fats, oils, or whole milk. Croissants. Butter rolls. Cheese breads. Sweet rolls. Donuts. Buttered popcorn. Chow mein noodles. High-fat crackers, such as cheese or butter crackers. Meats and Other Protein Sources Fatty meats, such as hotdogs, short ribs, sausage, spareribs, bacon, ribeye roast or steak, and mutton. High-fat deli meats, such as salami and bologna. Caviar. Domestic duck and goose. Organ meats, such as kidney, liver, sweetbreads, brains, gizzard, chitterlings, and heart. Dairy Cream, sour cream, cream cheese, and creamed cottage cheese. Whole milk cheeses, including blue (bleu), Monterey Jack, [place], [place], American, [place], Swiss, [place], [place], and [place]. Whole or 2% milk that is liquid, evaporated, or condensed. Whole buttermilk. Cream sauce or high-fat cheese sauce. Yogurt that is made from whole milk. Beverages Regular sodas and  drinks with added sugar. Sweets and Desserts Frosting. Pudding. Cookies. Cakes other than angel food cake. Candy that has milk chocolate or white chocolate, hydrogenated fat, butter, coconut, or unknown ingredients. Buttered syrups. Full-fat ice cream or ice cream drinks. Fats and Oils Gravy that has suet, meat fat, or shortening. Cocoa butter, hydrogenated oils, palm oil, coconut oil, palm kernel oil. These can often be found in baked products, candy, fried foods, nondairy creamers, and whipped toppings. Solid fats and shortenings, including bacon fat, salt pork, lard, and butter. Nondairy cream substitutes, such as coffee creamers and sour cream substitutes. Salad dressings that are made of unknown oils, cheese, or sour cream. The items listed above may not be a complete list of foods and beverages to avoid. Contact your dietitian for more information.   This information is not intended to replace advice given to you by your health care provider. Make sure you discuss any questions you have with your health care provider.     Document Released: 06/16/2008 Document Revised: 09/28/2014 Document Reviewed: 03/01/2014 Elsevier Interactive Patient Education [employer].

## 2016-03-16 NOTE — Progress Notes (Signed)
Patient Name: Carrie [name] Date of Encounter: 03/16/2016  Principal Problem:   NSTEMI (non-ST elevated myocardial infarction) [facility]) Active Problems:   S/P mitral valve replacement, St Jude   Chronic anticoagulation, ( INR goal 2.0-2.5 due to history of subdural hematoma and liver hemorrhage)   HTN (hypertension)   Atrial fibrillation with slow ventricular response (HCC)   Moderate to severe tricuspid regurgitation   Hyponatremia   AKI (acute kidney injury) [facility])   Primary Cardiologist: Dr. Sallyanne Kuster Patient Profile:Carrie [name] is a 75 y.o. 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   SUBJECTIVE: feels well. Denies chest pain and SOB.    OBJECTIVE Filed Vitals:   03/16/16 0810 03/16/16 0815 03/16/16 0818 03/16/16 0823  BP: 126/69 113/59 114/71   Pulse: 68 64 59 0  Temp:      TempSrc:      Resp: 13 14 12  0  Height:      Weight:      SpO2: 99% 100% 100% 0%    Intake/Output Summary (Last 24 hours) at 03/16/16 0919 Last data filed at 03/16/16 0718  Gross per 24 hour  Intake 684.14 ml  Output   2800 ml  Net -2115.86 ml   Filed Weights   03/14/16 0607 03/15/16 0636 03/16/16 0500  Weight: 144 lb (65.318 kg) 142 lb 12.8 oz (64.774 kg) 142 lb 9.6 oz (64.683 kg)    PHYSICAL EXAM General: Well developed, well nourished, female in no acute distress. Head: Normocephalic, atraumatic.  Neck: Supple without bruits, mild JVD. Lungs:  Resp regular and unlabored, CTA. Heart: RRR, S1, S2, no S3, S4, mechanical valve click; no rub. Abdomen:  Soft, non-tender, non-distended, BS + x 4.  Extremities: No clubbing, cyanosis, No edema.  Neuro: Alert and oriented X 3. Moves all extremities spontaneously. Psych: Normal affect.  LABS: CBC: Recent Labs  03/15/16 0318 03/16/16 0533  WBC 7.5 6.8  HGB 9.3* 9.7*  HCT 27.8* 29.6*  MCV 88.8 88.9  PLT 237 245   INR: Recent Labs  03/16/16 0533  INR [id number]   Basic Metabolic Panel: Recent Labs  03/15/16 0723 03/16/16 0533  NA 133* 132*  K 4.5 4.9  CL 102 104  CO2 22 21*  GLUCOSE 98 99  BUN 24* 25*  CREATININE 1.09* 1.12*  CALCIUM 10.1 9.8     Current facility-administered medications:  .  0.9 %  sodium chloride infusion, 250 mL, Intravenous, PRN, [name], MD .  0.9% sodium chloride infusion, 1 mL/kg/hr, Intravenous, Continuous, [name], MD, Last Rate: 64.7 mL/hr at 03/16/16 0853, 1 mL/kg/hr at 03/16/16 0853 .  acetaminophen (TYLENOL) tablet 650 mg, 650 mg, Oral, Q4H PRN, [name], MD .  atorvastatin (LIPITOR) tablet 80 mg, 80 mg, Oral, q1800, Bhavinkumar Bhagat, PA, 80 mg at 03/15/16 1822 .  diltiazem (CARDIZEM CD) 24 hr capsule 120 mg, 120 mg, Oral, Daily, Bhavinkumar Bhagat, PA, 120 mg at 03/15/16 1005 .  gi cocktail (Maalox,Lidocaine,Donnatal), 30 mL, Oral, QID PRN, Edwin Dada, MD .  heparin ADULT infusion 100 units/mL (25000 units/299mL sodium chloride 0.45%), 550 Units/hr, Intravenous, Continuous, [name], MD .  metoprolol tartrate (LOPRESSOR) tablet 12.5 mg, 12.5 mg, Oral, BID, Bhavinkumar Bhagat, PA, 12.5 mg at 03/15/16 2113 .  morphine 2 MG/ML injection 2 mg, 2 mg, Intravenous, Q2H PRN, Edwin Dada, MD .  nitroGLYCERIN (NITROGLYN) 2 % ointment 1 inch, 1 inch, Topical, Q6H, Bhavinkumar Bhagat, PA, 1 inch at 03/12/16 0631 .  ondansetron (ZOFRAN) injection 4 mg, 4 mg, Intravenous, Q6H PRN, [name], MD .  pantoprazole (PROTONIX) EC tablet 40 mg, 40 mg, Oral, Daily, Larnell Granlund M [place], MD, 40 mg  at 03/15/16 1005 .  potassium chloride (K-DUR,KLOR-CON) CR tablet 30 mEq, 30 mEq, Oral, BID, Clanford Marisa Hua, MD, 30 mEq at 03/15/16 2113 .  sodium chloride flush (NS) 0.9 % injection 3 mL, 3 mL, Intravenous, Q12H, [name], MD .  sodium chloride flush (NS) 0.9 % injection 3 mL, 3 mL, Intravenous, PRN, [name], MD .  Warfarin - Pharmacist Dosing Inpatient, , Does not apply, q1800, Laren Everts, RPH, Stopped at 03/14/16 1800 . sodium chloride 1 mL/kg/hr (03/16/16 0853)  . heparin      TELE: Afib       ECG: Afib  Left Heart Cath and Coronary Angiography 03/16/16  The left ventricular systolic function is low normal.  Nonobstructive coronary artery disease.  Normal LVEDP.  Restart IV heparin for mitral valve. Elevated troponin may have been from demand ischemia in the setting of anemia. Continue aggressive medical therapy.  Current Medications:  . atorvastatin  80 mg Oral q1800  . diltiazem  120 mg Oral Daily  . metoprolol tartrate  12.5 mg Oral BID  . nitroGLYCERIN  1 inch Topical Q6H  . pantoprazole  40 mg Oral Daily  . potassium chloride  30 mEq Oral BID  . sodium chloride flush  3 mL Intravenous Q12H  . Warfarin - Pharmacist Dosing Inpatient   Does not apply q1800   . sodium chloride 1 mL/kg/hr (03/16/16 0853)  . heparin      ASSESSMENT AND PLAN: Principal Problem:   NSTEMI (non-ST elevated myocardial infarction) ([place]) Active Problems:   S/P mitral valve replacement, St Jude   Chronic anticoagulation, ( INR goal 2.0-2.5 due to history of subdural hematoma and liver hemorrhage)   HTN (hypertension)   Atrial fibrillation with slow ventricular response (HCC)   Moderate to severe tricuspid regurgitation   Hyponatremia   AKI (acute kidney injury) ([place])  1. NSTEMI: Went for heart cath today, results above. No CAD. Elevated troponin most likely demand ischemia in the setting of anemia.  Medical therapy recommended. Will continue beta blocker  and high intensity statin.    2. Atrial fibrillation: Permanent. He is rate controlled with diltiazem and metoprolol. On Coumadin for anticoagulation, on heparin gtt currently. INR is 1.28.   This patients CHA2DS2-VASc Score and unadjusted Ischemic Stroke Rate (% per year) is equal to 9.7 % stroke rate/year from a score of 6 Above score calculated as 1 point each if present [CHF, HTN, DM, Vascular=MI/PAD/Aortic Plaque, Age if 65-74, or Female], 2 points each if present [Age > 75, or Stroke/TIA/TE]   3. S/p Mitral valve replacement: On heparin, will transition to home coumadin per pharmacy.   4.  HTN: Stable. Continue beta blocker and CCB.   5. Chronic moderate to severe TR:  No overt right heart failure.   6. Anemia: Hgb 9.7 today, Hct 29.6, improving.    Signed, Arbutus Leas , NP 9:19 AM 03/16/2016 Pager [phone]  Anemic but stable INR normal has  been on heparin over weekend Normal S1 click on exam Elevated troponin for cath today   Jenkins Rouge

## 2016-03-17 LAB — CBC
HEMATOCRIT: 27.3 % — AB (ref 36.0–46.0)
HEMOGLOBIN: 8.8 g/dL — AB (ref 12.0–15.0)
MCH: 28.9 pg (ref 26.0–34.0)
MCHC: 32.2 g/dL (ref 30.0–36.0)
MCV: 89.5 fL (ref 78.0–100.0)
Platelets: 203 10*3/uL (ref 150–400)
RBC: 3.05 MIL/uL — AB (ref 3.87–5.11)
RDW: 18.6 % — ABNORMAL HIGH (ref 11.5–15.5)
WBC: 7 10*3/uL (ref 4.0–10.5)

## 2016-03-17 LAB — BASIC METABOLIC PANEL
ANION GAP: 6 (ref 5–15)
BUN: 25 mg/dL — ABNORMAL HIGH (ref 6–20)
CO2: 21 mmol/L — AB (ref 22–32)
Calcium: 9.5 mg/dL (ref 8.9–10.3)
Chloride: 105 mmol/L (ref 101–111)
Creatinine, Ser: 1.19 mg/dL — ABNORMAL HIGH (ref 0.44–1.00)
GFR calc Af Amer: 51 mL/min — ABNORMAL LOW (ref >60)
GFR, EST NON AFRICAN AMERICAN: 44 mL/min — AB (ref >60)
GLUCOSE: 103 mg/dL — AB (ref 65–99)
POTASSIUM: 4.7 mmol/L (ref 3.5–5.1)
Sodium: 132 mmol/L — ABNORMAL LOW (ref 135–145)

## 2016-03-17 LAB — PROTIME-INR
INR: 1.34 (ref 0.00–1.49)
Prothrombin Time: 16.7 seconds — ABNORMAL HIGH (ref 11.6–15.2)

## 2016-03-17 LAB — HEPARIN LEVEL (UNFRACTIONATED): Heparin Unfractionated: 0.51 IU/mL (ref 0.30–0.70)

## 2016-03-17 MED ORDER — HEPARIN (PORCINE) IN NACL 100-0.45 UNIT/ML-% IJ SOLN
700.0000 [IU]/h | INTRAMUSCULAR | Status: DC
  Administered 2016-03-17 – 2016-03-18 (×2): 700 [IU]/h via INTRAVENOUS
  Filled 2016-03-17: qty 250

## 2016-03-17 MED ORDER — WARFARIN SODIUM 7.5 MG PO TABS
7.5000 mg | ORAL_TABLET | Freq: Once | ORAL | Status: AC
  Administered 2016-03-17: 7.5 mg via ORAL
  Filled 2016-03-17: qty 1

## 2016-03-17 NOTE — Progress Notes (Signed)
Patient ID: Carrie [name], female   DOB: 11/11/1940, 75 y.o.   MRN: [id number]     Patient Name: Carrie [name] Date of Encounter: 03/17/2016  Principal Problem:   NSTEMI (non-ST elevated myocardial infarction) [facility]) Active Problems:   S/P mitral valve replacement, St Jude   Chronic anticoagulation, ( INR goal 2.0-2.5 due to history of subdural hematoma and liver hemorrhage)   HTN (hypertension)   Atrial fibrillation with slow ventricular response (HCC)   Moderate to severe tricuspid regurgitation   Hyponatremia   AKI (acute kidney injury) ([place])   Primary Cardiologist: Dr. Sallyanne Kuster Patient Profile:Carrie [name] is a 75 y.o. 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   SUBJECTIVE: anxious to go home    OBJECTIVE Filed Vitals:   03/16/16 1026 03/16/16 1840 03/16/16 2044 03/17/16 0543  BP:  123/59 115/62 129/52  Pulse: 69 75 76 89  Temp:  98.2 F (36.8 C) 98.6 F (37 C) 98 F (36.7 C)  TempSrc:  Oral Oral Oral  Resp:  18 18 18   Height:      Weight:    143 lb 9.6 oz (65.137 kg)  SpO2: 100% 99% 97% 96%    Intake/Output Summary (Last 24 hours) at 03/17/16 0731 Last data filed at 03/17/16 0548  Gross per 24 hour  Intake    840 ml  Output   1201 ml  Net   -361 ml   Filed Weights   03/15/16 0636 03/16/16 0500 03/17/16 0543  Weight: 142 lb 12.8 oz (64.774 kg) 142 lb 9.6 oz (64.683 kg) 143 lb 9.6 oz (65.137 kg)    PHYSICAL EXAM General: Well developed, well nourished, female in no acute distress. Head: Normocephalic, atraumatic.  Neck:  Supple without bruits, mild JVD. Lungs:  Resp regular and unlabored, CTA. Heart: RRR, S1, S2, no S3, S4, mechanical valve click; no rub. Abdomen: Soft, non-tender, non-distended, BS + x 4.  Extremities: No clubbing, cyanosis, No edema.  Neuro: Alert and oriented X 3. Moves all extremities spontaneously. Psych: Normal affect.  LABS: CBC:  Recent Labs  03/16/16 0533 03/17/16 0422  WBC 6.8 7.0  HGB 9.7* 8.8*  HCT 29.6* 27.3*  MCV 88.9 89.5  PLT 245 203   INR:  Recent Labs  03/17/16 0422  INR [id number]   Basic Metabolic Panel:  Recent Labs  03/16/16 0533 03/17/16 0422  NA 132* 132*  K 4.9 4.7  CL 104 105  CO2 21* 21*  GLUCOSE 99 103*  BUN 25* 25*  CREATININE 1.12* 1.19*  CALCIUM 9.8 9.5     Current facility-administered medications:  .  0.9 %  sodium chloride infusion, 250 mL, Intravenous, PRN, [name], MD .  acetaminophen (TYLENOL) tablet 650 mg, 650 mg, Oral, Q4H PRN, [name], MD .  atorvastatin (LIPITOR) tablet 80 mg, 80 mg, Oral, q1800, Bhavinkumar Bhagat, PA, 80 mg at 03/16/16 1746 .  diltiazem (CARDIZEM CD) 24 hr capsule 120 mg, 120 mg, Oral, Daily, Bhavinkumar Bhagat, PA, 120 mg at 03/16/16 0954 .  gi cocktail (Maalox,Lidocaine,Donnatal), 30 mL, Oral, QID PRN, Edwin Dada, MD .  heparin ADULT infusion 100 units/mL (25000  units/223mL sodium chloride 0.45%), 750 Units/hr, Intravenous, Continuous, Eudelia Bunch, RPH, Last Rate: 7.5 mL/hr at 03/16/16 2353, 750 Units/hr at 03/16/16 2353 .  metoprolol tartrate (LOPRESSOR) tablet 12.5 mg, 12.5 mg, Oral, BID, Bhavinkumar Bhagat, PA, 12.5 mg at 03/16/16 2124 .  morphine 2 MG/ML injection 2 mg, 2 mg, Intravenous, Q2H PRN, Edwin Dada, MD .  nitroGLYCERIN (NITROGLYN) 2 % ointment 1 inch, 1 inch, Topical, Q6H, Bhavinkumar Bhagat, PA, 1 inch at 03/12/16 0631 .  ondansetron (ZOFRAN) injection 4 mg, 4 mg, Intravenous, Q6H PRN, [name], MD .  pantoprazole (PROTONIX) EC  tablet 40 mg, 40 mg, Oral, Daily, Peter M Martinique, MD, 40 mg at 03/16/16 0954 .  sodium chloride flush (NS) 0.9 % injection 3 mL, 3 mL, Intravenous, Q12H, [name], MD, 3 mL at 03/16/16 2124 .  sodium chloride flush (NS) 0.9 % injection 3 mL, 3 mL, Intravenous, PRN, [name], MD .  Warfarin - Pharmacist Dosing Inpatient, , Does not apply, q1800, Laren Everts, RPH, Stopped at 03/14/16 1800 . heparin 750 Units/hr (03/16/16 2353)    TELE: Afib       ECG: Afib  Left Heart Cath and Coronary Angiography 03/16/16  The left ventricular systolic function is low normal.  Nonobstructive coronary artery disease.  Normal LVEDP.  Restart IV heparin for mitral valve. Elevated troponin may have been from demand ischemia in the setting of anemia. Continue aggressive medical therapy.  Current Medications:  . atorvastatin  80 mg Oral q1800  . diltiazem  120 mg Oral Daily  . metoprolol tartrate  12.5 mg Oral BID  . nitroGLYCERIN  1 inch Topical Q6H  . pantoprazole  40 mg Oral Daily  . sodium chloride flush  3 mL Intravenous Q12H  . Warfarin - Pharmacist Dosing Inpatient   Does not apply q1800   . heparin 750 Units/hr (03/16/16 2353)    ASSESSMENT AND PLAN: Principal Problem:   NSTEMI (non-ST elevated myocardial infarction) ([place]) Active Problems:   S/P mitral valve replacement, St Jude   Chronic anticoagulation, ( INR goal 2.0-2.5 due to history of subdural hematoma and liver hemorrhage)   HTN (hypertension)   Atrial fibrillation with slow ventricular response (HCC)   Moderate to severe tricuspid regurgitation   Hyponatremia   AKI (acute kidney injury) ([place])  1. NSTEMI: no CAD on cath yesterday.  No chest pain improved   2. Atrial fibrillation: Permanent. He is rate controlled with diltiazem and metoprolol. On Coumadin for anticoagulation, on heparin gtt currently. Would keep in  Hospital and have pharmacy dose coumadin D/c when INR over 2.0 given afib and  mechanical MV  This patients CHA2DS2-VASc Score and unadjusted Ischemic Stroke Rate (% per year) is equal to 9.7 % stroke rate/year from a score of 6 Above score calculated as 1 point each if present [CHF, HTN, DM, Vascular=MI/PAD/Aortic Plaque, Age if 65-74, or Female], 2 points each if present [Age > 75, or Stroke/TIA/TE]   3. S/p Mitral valve replacement: On heparin, will transition to home coumadin per pharmacy.   4.  HTN: Stable. Continue beta blocker and CCB.   5. Chronic moderate to severe TR:  No overt right heart failure.   6. Anemia:  Lab Results  Component Value Date   HCT 27.3* 03/17/2016    Jenkins Rouge

## 2016-03-17 NOTE — Plan of Care (Signed)
Problem: Phase III Progression Outcomes Goal: Hemodynamically stable Outcome: Progressing VSS. Asymptomatic for Arrhythmia

## 2016-03-17 NOTE — Progress Notes (Signed)
PROGRESS NOTE    RONNI CUBERO  [id number]  DOB: May 12, 1941  DOA: 03/11/2016 PCP: Elyn Peers, MD Outpatient Specialists:   Hospital course: REMMY JENNESS is a 75 y.o. 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  The patient was in her normal health until tonight at midnight while watching TV when she had onset of left sided chest pressure. The pain was located in the left chest, radiating to the left arm. It was associated with nausea and "feeling very uncomfortable". It was constant and pressure like and unlike any sensation she had had before. Her husband called 9-1-1 and she was transported here. Pt for cardiac cath on 6/26.  No CAD found.  Pt has mechanical mitral valve and would need INR >2 before discharge.    Assessment & Plan:   1. Chest pain/NSTEMI The patient presented with HEART score of 6.Troponins have been elevated and planning for cath 6/26.  Pt reports resolution of chest pain.  Continue to follow and monitor closely.  IV heparin.  Nitropaste, metoprolol.  No CAD found on cath.  Pt on IV heparin with plans to get INR>2 before discharge home because of mechanical St. Jude Mitral Valve.    2. HTN:  -metroprolol 12.5 BID  3. Afib and SSS with pacer:  CHADS2Vasc 3. On warfarin, INR goal 2.0-2.5 given hx of SAH. But holding warfarin for cath.  Plan to get INR>2 before discharge.  On IV heparin and warfarin at this time.  - continue home diltiazem and metoprolol, she does have elevation of HR with ambulation, will monitor  4. Chronic diastolic CHF:  - clinically appears compensated, holding lasix for now due to acute renal insufficiency.  Resume in 1-2 days.   5. Anemia:  Hg has been stable, following.  Thrombocytopenia has resolved.  6. Hypokalemia: -Repleted orally  7. Hyponatremia - chronic - unchanged,  stable, suspect this is secondary to thiazide and lasix, holding both for now.   8.  Renal Insufficiency - acute  - likely secondary to diuretic use, will hold lasix for now, resume in 1-2 days.  Follow BMP.    Code Status: Full  Consults called: Cardiology Admission status: Telemetry, observation  Procedures:  Cath 6/26 - see reports  Subjective: Pt denies any complaints today.     Objective: Filed Vitals:   03/16/16 1026 03/16/16 1840 03/16/16 2044 03/17/16 0543  BP:  123/59 115/62 129/52  Pulse: 69 75 76 89  Temp:  98.2 F (36.8 C) 98.6 F (37 C) 98 F (36.7 C)  TempSrc:  Oral Oral Oral  Resp:  18 18 18   Height:      Weight:    143 lb 9.6 oz (65.137 kg)  SpO2: 100% 99% 97% 96%    Intake/Output Summary (Last 24 hours) at 03/17/16 0745 Last data filed at 03/17/16 0548  Gross per 24 hour  Intake    840 ml  Output   1201 ml  Net   -361 ml   Filed Weights   03/15/16 0636 03/16/16 0500 03/17/16 0543  Weight: 142 lb 12.8 oz (64.774 kg) 142 lb 9.6 oz (64.683 kg) 143 lb 9.6 oz (65.137 kg)    Exam:  General appearance: Well-developed, elderly adult female, alert and in no acute distress. Skin: Warm and dry.  Cardiac: RRR, nl S1-S2, no murmurs appreciated, mechanical sounding  valve. Capillary refill is brisk Respiratory: Normal respiratory rate and rhythm. CTAB without rales or wheezes. Abdomen: Abdomen soft without rigidity. No TTP.No ascites, distension.  MSK: No deformities or effusions. Pain not reproduced with palpation of precordium. No pain with arm movement. Neuro: Sensorium intact and responding to questions, attention normal. Speech is fluent. Moves all extremities equally and with normal coordination.  Psych: Behavior appropriate. Affect normal. No evidence of aural or visual hallucinations or delusions.   Data Reviewed: Basic Metabolic Panel:  Recent Labs Lab 03/12/16 0430 03/13/16 0438 03/15/16 0723 03/16/16 0533 03/17/16 0422    NA 132* 132* 133* 132* 132*  K 4.4 4.9 4.5 4.9 4.7  CL 100* 103 102 104 105  CO2 24 23 22  21* 21*  GLUCOSE 105* 97 98 99 103*  BUN 14 14 24* 25* 25*  CREATININE 1.11* 1.19* 1.09* 1.12* 1.19*  CALCIUM 9.7 9.3 10.1 9.8 9.5   Liver Function Tests:  Recent Labs Lab 03/12/16 0430  AST 121*  ALT 23  ALKPHOS 115  BILITOT 0.8  PROT 7.7  ALBUMIN 3.6   No results for input(s): LIPASE, AMYLASE in the last 168 hours. No results for input(s): AMMONIA in the last 168 hours. CBC:  Recent Labs Lab 03/11/16 0210  03/13/16 0438 03/14/16 0225 03/15/16 0318 03/16/16 0533 03/17/16 0422  WBC 5.9  < > 7.3 7.2 7.5 6.8 7.0  NEUTROABS 4.0  --  4.1  --   --   --   --   HGB 11.0*  < > 9.3* 9.1* 9.3* 9.7* 8.8*  HCT 32.8*  < > 28.7* 28.1* 27.8* 29.6* 27.3*  MCV 90.6  < > 89.7 89.8 88.8 88.9 89.5  PLT 203  < > 223 215 237 245 203  < > = values in this interval not displayed. Cardiac Enzymes:  Recent Labs Lab 03/11/16 0747 03/11/16 1226  TROPONINI 2.01* 5.32*   BNP (last 3 results) No results for input(s): PROBNP in the last 8760 hours. CBG: No results for input(s): GLUCAP in the last 168 hours.  No results found for this or any previous visit (from the past 240 hour(s)).   Studies: No results found.   Scheduled Meds: . atorvastatin  80 mg Oral q1800  . diltiazem  120 mg Oral Daily  . metoprolol tartrate  12.5 mg Oral BID  . nitroGLYCERIN  1 inch Topical Q6H  . pantoprazole  40 mg Oral Daily  . sodium chloride flush  3 mL Intravenous Q12H  . Warfarin - Pharmacist Dosing Inpatient   Does not apply q1800   Continuous Infusions: . heparin 750 Units/hr (03/16/16 2353)    Principal Problem:   NSTEMI (non-ST elevated myocardial infarction) ([place]) Active Problems:   Hyponatremia   S/P mitral valve replacement, St Jude   Chronic anticoagulation, ( INR goal 2.0-2.5 due to history of subdural hematoma and liver hemorrhage)   HTN (hypertension)   Atrial fibrillation with slow  ventricular response (HCC)   Moderate to severe tricuspid regurgitation   AKI (acute kidney injury) ([place])  Time spent:   Irwin Brakeman, MD, FAAFP [facility] Pager [phone]  If 7PM-7AM, please contact night-coverage [email or web address] Password [email or web address] 03/17/2016, 7:45 AM    LOS: 6 days

## 2016-03-17 NOTE — Progress Notes (Signed)
ANTICOAGULATION CONSULT NOTE - Follow Up Consult  Pharmacy Consult:  Heparin / Coumadin Indication:   Afib + St. Jude mechanical MVR  Allergies  Allergen Reactions  . Codeine Nausea And Vomiting    Patient Measurements: Height: 5\' 1"  (154.9 cm) Weight: 143 lb 9.6 oz (65.137 kg) IBW/kg (Calculated) : 47.8 Heparin Dosing Weight: 65 kg  Vital Signs: Temp: 98 F (36.7 C) (06/27 0543) Temp Source: Oral (06/27 0543) BP: 129/52 mmHg (06/27 0543) Pulse Rate: 89 (06/27 0543)  Labs:  Recent Labs  03/15/16 0318 03/15/16 0723 03/16/16 0533 03/16/16 2212 03/17/16 0422  HGB 9.3*  --  9.7*  --  8.8*  HCT 27.8*  --  29.6*  --  27.3*  PLT 237  --  245  --  203  LABPROT 17.7*  --  16.1*  --  16.7*  INR 1.45  --  1.28  --  1.34  HEPARINUNFRC 0.38  --  0.36 0.16*  --   CREATININE  --  1.09* 1.12*  --  1.19*    Estimated Creatinine Clearance: 35.8 mL/min (by C-G formula based on Cr of 1.19).     Assessment: 84 YOM with history of Afib and St. Jude mechanical MVR on Coumadin PTA with a lower INR goal due to history of SDH and liver hemorrhage.  She was transitioned to IV heparin on admit for cath, and heparin and Coumadin were resumed post cath on 03/16/16.  Heparin level is therapeutic and INR is bellow goal as expected.  Noted she received Vitamin K on 03/12/16.  No bleeding reported.    Goal of Therapy:  Heparin level 0.3-0.7 units/ml  INR 2 - 2.5 per Cards Monitor platelets by anticoagulation protocol: Yes    Plan:  - Decrease heparin gtt slightly to 700 units/hr - Coumadin 7.5mg  PO today - Daily HL / CBC / PT / INR    Chery Giusto D. Mina Marble, PharmD, BCPS Pager:  [phone] 03/17/2016, 8:53 AM

## 2016-03-18 ENCOUNTER — Other Ambulatory Visit: Payer: Self-pay | Admitting: *Deleted

## 2016-03-18 DIAGNOSIS — D6489 Other specified anemias: Secondary | ICD-10-CM

## 2016-03-18 DIAGNOSIS — N179 Acute kidney failure, unspecified: Secondary | ICD-10-CM

## 2016-03-18 DIAGNOSIS — D649 Anemia, unspecified: Secondary | ICD-10-CM | POA: Insufficient documentation

## 2016-03-18 LAB — BASIC METABOLIC PANEL
Anion gap: 9 (ref 5–15)
BUN: 23 mg/dL — AB (ref 6–20)
CALCIUM: 9.4 mg/dL (ref 8.9–10.3)
CHLORIDE: 103 mmol/L (ref 101–111)
CO2: 21 mmol/L — AB (ref 22–32)
CREATININE: 1.13 mg/dL — AB (ref 0.44–1.00)
GFR calc Af Amer: 54 mL/min — ABNORMAL LOW (ref >60)
GFR calc non Af Amer: 47 mL/min — ABNORMAL LOW (ref >60)
GLUCOSE: 96 mg/dL (ref 65–99)
Potassium: 4.5 mmol/L (ref 3.5–5.1)
Sodium: 133 mmol/L — ABNORMAL LOW (ref 135–145)

## 2016-03-18 LAB — URINE MICROSCOPIC-ADD ON: BACTERIA UA: NONE SEEN

## 2016-03-18 LAB — PROTIME-INR
INR: 1.33 (ref 0.00–1.49)
Prothrombin Time: 16.6 seconds — ABNORMAL HIGH (ref 11.6–15.2)

## 2016-03-18 LAB — URINALYSIS, ROUTINE W REFLEX MICROSCOPIC
Bilirubin Urine: NEGATIVE
GLUCOSE, UA: NEGATIVE mg/dL
Ketones, ur: NEGATIVE mg/dL
LEUKOCYTES UA: NEGATIVE
Nitrite: NEGATIVE
PROTEIN: NEGATIVE mg/dL
SPECIFIC GRAVITY, URINE: 1.013 (ref 1.005–1.030)
pH: 7 (ref 5.0–8.0)

## 2016-03-18 LAB — OCCULT BLOOD X 1 CARD TO LAB, STOOL: FECAL OCCULT BLD: POSITIVE — AB

## 2016-03-18 LAB — HEPARIN LEVEL (UNFRACTIONATED): Heparin Unfractionated: 0.45 IU/mL (ref 0.30–0.70)

## 2016-03-18 MED ORDER — WARFARIN SODIUM 7.5 MG PO TABS
7.5000 mg | ORAL_TABLET | Freq: Once | ORAL | Status: AC
  Administered 2016-03-18: 7.5 mg via ORAL
  Filled 2016-03-18: qty 1

## 2016-03-18 MED ORDER — SENNOSIDES-DOCUSATE SODIUM 8.6-50 MG PO TABS
2.0000 | ORAL_TABLET | Freq: Two times a day (BID) | ORAL | Status: DC
  Administered 2016-03-19 – 2016-03-23 (×6): 2 via ORAL
  Filled 2016-03-18 (×8): qty 2

## 2016-03-18 NOTE — Care Management Important Message (Signed)
Important Message  Patient Details  Name: Carrie [name] MRN: [id number] Date of Birth: 1940-12-28   Medicare Important Message Given:  Yes    Loann Quill 03/18/2016, 10:08 AM

## 2016-03-18 NOTE — Consult Note (Addendum)
   [facility] Inpatient Consult   03/18/2016  KALEENA HUGO 10/19/1940 [id number]   Patient evaluated for long-term disease management services with [facility] program. Martin Majestic to bedside to discuss and offer [facility] services. Mrs. Redner is agreeable and written consent signed. Explained to patient that she will receive post hospital transition of care calls and will be evaluated for monthly home visits. Confirmed Primary Care MD as Dr. Criss Rosales.  Confirmed best contact number as [phone]. She lives with husband. Explained that [facility] will not interfere or replace services provided by home health if she should have it. She denies having issues with obtaining medications or with transportation.  [facility] Care Management packet and contact information at bedside. Will make inpatient RNCM aware that patient will be followed by [facility] post hospital discharge.   Will request to be assigned to [place] for transition of care. Mrs. Gent has history of CHF, AFIB, HTN. She has been hospitalized for NSTEMI.    Marthenia Rolling, MSN-Ed, RN,BSN [facility] Liaison [phone]

## 2016-03-18 NOTE — Progress Notes (Signed)
Patient Name: Carrie [name] Date of Encounter: 03/18/2016  Principal Problem:   NSTEMI (non-ST elevated myocardial infarction) [facility]) Active Problems:   S/P mitral valve replacement, St Jude   Chronic anticoagulation, ( INR goal 2.0-2.5 due to history of subdural hematoma and liver hemorrhage)   HTN (hypertension)   Atrial fibrillation with slow ventricular response (HCC)   Moderate to severe tricuspid regurgitation   Hyponatremia   AKI (acute kidney injury) [facility])   Primary Cardiologist: Dr. Sallyanne Kuster Patient Profile: Carrie [name] is a 75 y.o. 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   SUBJECTIVE: feels ok, still reports getting tired easily with walking. Denies chest pain.    OBJECTIVE Filed Vitals:   03/17/16 1344 03/17/16 2000 03/17/16 2223 03/18/16 0530  BP: 116/55  123/59 106/59  Pulse: 74  87 73  Temp: 98.1 F (36.7 C)   98.8 F (37.1 C)  TempSrc: Oral   Oral  Resp:    16  Height:      Weight:    145 lb 6.4 oz (65.953 kg)  SpO2: 99% 98%  96%    Intake/Output Summary (Last 24 hours) at 03/18/16 0814 Last data filed at 03/17/16 2200  Gross per 24 hour  Intake    243 ml  Output   1250 ml  Net  -1007 ml   Filed Weights   03/16/16 0500 03/17/16 0543 03/18/16 0530  Weight: 142 lb 9.6 oz (64.683 kg) 143 lb 9.6 oz (65.137 kg) 145 lb 6.4 oz (65.953 kg)    PHYSICAL EXAM General: Well developed, well nourished, female in no acute distress. Head: Normocephalic, atraumatic.  Neck: Supple without bruits, No JVD. Lungs:  Resp  regular and unlabored, CTA. Heart: RRR, S1, S2, no S3, S4, or murmur; mechanical valve click  no rub. Abdomen: Soft, non-tender, non-distended, BS + x 4.  Extremities: No clubbing, cyanosis, No edema.  Neuro: Alert and oriented X 3. Moves all extremities spontaneously. Psych: Normal affect.  LABS: CBC: Recent Labs  03/16/16 0533 03/17/16 0422  WBC 6.8 7.0  HGB 9.7* 8.8*  HCT 29.6* 27.3*  MCV 88.9 89.5  PLT 245 203   INR: Recent Labs  03/18/16 0509  INR [id number]   Basic Metabolic Panel: Recent Labs  03/17/16 0422 03/18/16 0509  NA 132* 133*  K 4.7 4.5  CL 105 103  CO2 21* 21*  GLUCOSE 103* 96  BUN 25* 23*  CREATININE 1.19* 1.13*  CALCIUM 9.5 9.4     Current facility-administered medications:  .  0.9 %  sodium chloride infusion, 250 mL, Intravenous, PRN, [name], MD .  acetaminophen (TYLENOL) tablet 650 mg, 650 mg, Oral, Q4H PRN, [name], MD .  atorvastatin (LIPITOR) tablet 80 mg, 80 mg, Oral, q1800, Bhavinkumar Bhagat, PA, 80 mg at 03/17/16 1853 .  diltiazem (CARDIZEM CD) 24 hr capsule 120 mg, 120 mg, Oral, Daily, Bhavinkumar Bhagat, PA, 120 mg at 03/17/16 0946 .  gi cocktail (Maalox,Lidocaine,Donnatal), 30 mL, Oral, QID PRN, Edwin Dada, MD .  heparin ADULT infusion 100 units/mL (25000 units/2109mL sodium chloride 0.45%), 700 Units/hr, Intravenous, Continuous, Tyrone Apple, RPH,  Last Rate: 7 mL/hr at 03/18/16 0132, 700 Units/hr at 03/18/16 0132 .  metoprolol tartrate (LOPRESSOR) tablet 12.5 mg, 12.5 mg, Oral, BID, Bhavinkumar Bhagat, PA, 12.5 mg at 03/17/16 2223 .  morphine 2 MG/ML injection 2 mg, 2 mg, Intravenous, Q2H PRN, Edwin Dada, MD .  nitroGLYCERIN (NITROGLYN) 2 % ointment 1 inch, 1 inch, Topical, Q6H, Bhavinkumar Bhagat, PA, 1 inch at 03/12/16 0631 .  ondansetron (ZOFRAN) injection 4 mg, 4 mg, Intravenous, Q6H PRN, [name], MD .  pantoprazole (PROTONIX) EC tablet 40 mg, 40 mg, Oral, Daily, Vinicio Lynk M Martinique,  MD, 40 mg at 03/17/16 0946 .  sodium chloride flush (NS) 0.9 % injection 3 mL, 3 mL, Intravenous, Q12H, [name], MD, 3 mL at 03/17/16 2200 .  sodium chloride flush (NS) 0.9 % injection 3 mL, 3 mL, Intravenous, PRN, [name], MD .  warfarin (COUMADIN) tablet 7.5 mg, 7.5 mg, Oral, ONCE-1800, Tyrone Apple, RPH .  Warfarin - Pharmacist Dosing Inpatient, , Does not apply, q1800, Laren Everts, RPH, Stopped at 03/14/16 1800 . heparin 700 Units/hr (03/18/16 0132)   TELE:     V paced     Current Medications:  . atorvastatin  80 mg Oral q1800  . diltiazem  120 mg Oral Daily  . metoprolol tartrate  12.5 mg Oral BID  . nitroGLYCERIN  1 inch Topical Q6H  . pantoprazole  40 mg Oral Daily  . sodium chloride flush  3 mL Intravenous Q12H  . warfarin  7.5 mg Oral ONCE-1800  . Warfarin - Pharmacist Dosing Inpatient   Does not apply q1800   . heparin 700 Units/hr (03/18/16 0132)    ASSESSMENT AND PLAN: Principal Problem:   NSTEMI (non-ST elevated myocardial infarction) ([place]) Active Problems:   S/P mitral valve replacement, St Jude   Chronic anticoagulation, ( INR goal 2.0-2.5 due to history of subdural hematoma and liver hemorrhage)   HTN (hypertension)   Atrial fibrillation with slow ventricular response (HCC)   Moderate to severe tricuspid regurgitation   Hyponatremia   AKI (acute kidney injury) ([place])  1. NSTEMI: no CAD by cath. No chest pain improved. Still reports that she gets tired easily with walking around the unit. This is new for her.   2. Atrial fibrillation: Permanent. She is rate controlled with diltiazem and metoprolol. On Coumadin for anticoagulation, on heparin gtt currently. Would keep in hospital and have pharmacy dose coumadin. D/c when INR over 2.0 given afib and mechanical MV.   This patients CHA2DS2-VASc Score and unadjusted Ischemic Stroke Rate (% per year) is equal to 9.7 % stroke rate/year from a score of 6 Above score calculated as 1 point each  if present [CHF, HTN, DM, Vascular=MI/PAD/Aortic Plaque, Age if 65-74, or Female], 2 points each if present [Age > 75, or Stroke/TIA/TE]  3. S/p Mitral valve replacement: On heparin, will transition to home coumadin per pharmacy.   4. HTN: Stable. Continue beta blocker and CCB.   5. Chronic moderate to severe TR: No overt right heart failure.   6. Anemia: resolved.      Signed, Arbutus Leas , NP 8:14 AM 03/18/2016 Pager [phone]  No chest pain INR sub Rx S1 click normal no MR murmur If she is willing to get lovenox shots Can possibly go home with this otherwise will need to stay until INR over 2.2 given mechanical MVR and afib.  Will sign off outpatient f/u made by PA  Jenkins Rouge

## 2016-03-18 NOTE — Progress Notes (Signed)
ANTICOAGULATION CONSULT NOTE - Follow Up Consult  Pharmacy Consult:  Heparin / Coumadin Indication:   Afib + St. Jude mechanical MVR  Allergies  Allergen Reactions  . Codeine Nausea And Vomiting    Patient Measurements: Height: 5\' 1"  (154.9 cm) Weight: 145 lb 6.4 oz (65.953 kg) IBW/kg (Calculated) : 47.8 Heparin Dosing Weight: 65 kg  Vital Signs: Temp: 98.8 F (37.1 C) (06/28 0530) Temp Source: Oral (06/28 0530) BP: 106/59 mmHg (06/28 0530) Pulse Rate: 73 (06/28 0530)  Labs:  Recent Labs  03/16/16 0533 03/16/16 2212 03/17/16 0422 03/17/16 0755 03/18/16 0509  HGB 9.7*  --  8.8*  --   --   HCT 29.6*  --  27.3*  --   --   PLT 245  --  203  --   --   LABPROT 16.1*  --  16.7*  --  16.6*  INR 1.28  --  1.34  --  1.33  HEPARINUNFRC 0.36 0.16*  --  0.51 0.45  CREATININE 1.12*  --  1.19*  --  1.13*    Estimated Creatinine Clearance: 38 mL/min (by C-G formula based on Cr of 1.13).     Assessment: Carrie [name] with history of Afib and St. Jude mechanical MVR on Coumadin PTA with a lower INR goal due to history of SDH and liver hemorrhage.  She was transitioned to IV heparin on admit for cath, and heparin and Coumadin were resumed post cath on 03/16/16.  Heparin level is therapeutic and INR is bellow goal.  Noted she received Vitamin K on 03/12/16.  No bleeding reported.    Goal of Therapy:  Heparin level 0.3-0.7 units/ml  INR 2 - 2.5 per Cards Monitor platelets by anticoagulation protocol: Yes    Plan:  - Continue heparin gtt at 700 units/hr - Coumadin 7.5mg  PO today - Daily HL / CBC / PT / INR   Darious Rehman D. Mina Marble, PharmD, BCPS Pager:  [phone] 03/18/2016, 8:05 AM

## 2016-03-18 NOTE — Progress Notes (Signed)
PROGRESS NOTE    Carrie [name]  [id number]  DOB: Oct 23, 1940  DOA: 03/11/2016 PCP: Elyn Peers, MD Outpatient Specialists:   Hospital course: Carrie [name] is a 75 y.o. 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  The patient was in her normal health until tonight at midnight while watching TV when she had onset of left sided chest pressure. The pain was located in the left chest, radiating to the left arm. It was associated with nausea and "feeling very uncomfortable". It was constant and pressure like and unlike any sensation she had had before. Her husband called 9-1-1 and she was transported here. Pt for cardiac cath on 6/26.  No CAD found.  Pt has mechanical mitral valve and would need INR >2 before discharge.    Assessment & Plan:   1. Chest pain/NSTEMI The patient presented with HEART score of 6.Troponins have been elevated and planning for cath 6/26.  Pt reports resolution of chest pain.  Continue to follow and monitor closely.  IV heparin.  Nitropaste, metoprolol.  No CAD found on cath.  Pt on IV heparin with plans to get INR>2 before discharge home because of mechanical St. Jude Mitral Valve.    2. HTN:  -metroprolol 12.5 BID  3. Afib and SSS with pacer:  CHADS2Vasc 3. On warfarin, INR goal 2.0-2.5 given hx of SAH. But holding warfarin for cath.  Plan to get INR>2 before discharge.  On IV heparin and warfarin at this time.  - continue home diltiazem and metoprolol, she does have elevation of HR with ambulation, will monitor  4. Chronic diastolic CHF:  - clinically appears compensated, holding lasix for now due to acute renal insufficiency.  No edema, lung clear today on 6/28,  Cr remain mildly elevated, continue hold lasix, Resume in 1-2 days.   5. Anemia:  Mild  Thrombocytopenia presented on admission has  resolved. Normocytic, hgb trending down, reported feeling tired, will get anemia work up, may need to transfuse due to symptomatic anemia   6. Hypokalemia: -Repleted orally  7. Hyponatremia - chronic - unchanged, stable, suspect this is secondary to thiazide and lasix, holding both for now.   8.  Renal Insufficiency - acute  - likely secondary to diuretic use, will hold lasix for now, resume in 1-2 days.  Follow BMP.    Code Status: Full  Consults called: Cardiology Disposition: home in 1-2 days, inr not therapeutic, anemia work up, cr and lasix dosing need to be determined prior to discharge  Procedures:  Cath 6/26 - see reports  Subjective: Pt denies any complaints today.   hgb decreasing, no sign of blood loss, reported baseline dyspnea on exertion. No chest pain today.   Objective: Filed Vitals:   03/17/16 1344 03/17/16 2000 03/17/16 2223 03/18/16 0530  BP: 116/55  123/59 106/59  Pulse: 74  87 73  Temp: 98.1 F (36.7 C)   98.8 F (37.1 C)  TempSrc: Oral   Oral  Resp:    16  Height:      Weight:    65.953 kg (145 lb 6.4 oz)  SpO2: 99% 98%  96%    Intake/Output Summary (Last 24 hours) at 03/18/16 0857 Last data filed at 03/17/16 2200  Gross per 24 hour  Intake      3 ml  Output   1250 ml  Net  -1247 ml   Filed Weights   03/16/16 0500 03/17/16 0543 03/18/16 0530  Weight: 64.683 kg (142 lb 9.6 oz) 65.137 kg (143 lb 9.6 oz) 65.953 kg (145 lb 6.4 oz)    Exam:  General appearance: Well-developed, elderly adult female, alert and in no acute distress. Skin: Warm and dry.  Cardiac: RRR, nl S1-S2, no murmurs appreciated, mechanical sounding valve. Capillary refill is brisk Respiratory: Normal respiratory rate and rhythm. CTAB without rales or wheezes. Abdomen: Abdomen soft without rigidity. No TTP.No ascites, distension.  MSK: No deformities or effusions. Pain not reproduced with palpation of precordium. No pain with arm movement. Neuro: Sensorium intact  and responding to questions, attention normal. Speech is fluent. Moves all extremities equally and with normal coordination.  Psych: Behavior appropriate. Affect normal. No evidence of aural or visual hallucinations or delusions.   Data Reviewed: Basic Metabolic Panel:  Recent Labs Lab 03/13/16 0438 03/15/16 0723 03/16/16 0533 03/17/16 0422 03/18/16 0509  NA 132* 133* 132* 132* 133*  K 4.9 4.5 4.9 4.7 4.5  CL 103 102 104 105 103  CO2 23 22 21* 21* 21*  GLUCOSE 97 98 99 103* 96  BUN 14 24* 25* 25* 23*  CREATININE 1.19* 1.09* 1.12* 1.19* 1.13*  CALCIUM 9.3 10.1 9.8 9.5 9.4   Liver Function Tests:  Recent Labs Lab 03/12/16 0430  AST 121*  ALT 23  ALKPHOS 115  BILITOT 0.8  PROT 7.7  ALBUMIN 3.6   No results for input(s): LIPASE, AMYLASE in the last 168 hours. No results for input(s): AMMONIA in the last 168 hours. CBC:  Recent Labs Lab 03/13/16 0438 03/14/16 0225 03/15/16 0318 03/16/16 0533 03/17/16 0422  WBC 7.3 7.2 7.5 6.8 7.0  NEUTROABS 4.1  --   --   --   --   HGB 9.3* 9.1* 9.3* 9.7* 8.8*  HCT 28.7* 28.1* 27.8* 29.6* 27.3*  MCV 89.7 89.8 88.8 88.9 89.5  PLT 223 215 237 245 203   Cardiac Enzymes:  Recent Labs Lab 03/11/16 1226  TROPONINI 5.32*   BNP (last 3 results) No results for input(s): PROBNP in the last 8760 hours. CBG: No results for input(s): GLUCAP in the last 168 hours.  No results found for this or any previous visit (from the past 240 hour(s)).   Studies: No results found.   Scheduled Meds: . atorvastatin  80 mg Oral q1800  . diltiazem  120 mg Oral Daily  . metoprolol tartrate  12.5 mg Oral BID  . nitroGLYCERIN  1 inch Topical Q6H  . pantoprazole  40 mg Oral Daily  . sodium chloride flush  3 mL Intravenous Q12H  . warfarin  7.5 mg Oral ONCE-1800  . Warfarin - Pharmacist Dosing Inpatient   Does not apply q1800   Continuous Infusions: . heparin 700 Units/hr (03/18/16 0132)    Principal Problem:   NSTEMI (non-ST  elevated myocardial infarction) ([place]) Active Problems:   S/P mitral valve replacement, St Jude   Chronic anticoagulation, ( INR goal 2.0-2.5 due to history of subdural hematoma and liver hemorrhage)   HTN (hypertension)   Atrial fibrillation with slow ventricular response (HCC)   Moderate to severe tricuspid regurgitation   Hyponatremia   AKI (acute kidney injury) ([place])  Time spent:   Prarthana Parlin, MD, PhD [facility] Pager [phone]  If 7PM-7AM, please contact night-coverage [email or web address] Password [facility] 03/18/2016, 8:57 AM    LOS: 7 days

## 2016-03-19 DIAGNOSIS — D6489 Other specified anemias: Secondary | ICD-10-CM

## 2016-03-19 LAB — COMPREHENSIVE METABOLIC PANEL
ALBUMIN: 3.1 g/dL — AB (ref 3.5–5.0)
ALK PHOS: 102 U/L (ref 38–126)
ALT: 23 U/L (ref 14–54)
AST: 100 U/L — ABNORMAL HIGH (ref 15–41)
Anion gap: 8 (ref 5–15)
BUN: 23 mg/dL — ABNORMAL HIGH (ref 6–20)
CALCIUM: 9.2 mg/dL (ref 8.9–10.3)
CHLORIDE: 103 mmol/L (ref 101–111)
CO2: 20 mmol/L — AB (ref 22–32)
CREATININE: 1.16 mg/dL — AB (ref 0.44–1.00)
GFR calc Af Amer: 52 mL/min — ABNORMAL LOW (ref >60)
GFR calc non Af Amer: 45 mL/min — ABNORMAL LOW (ref >60)
GLUCOSE: 87 mg/dL (ref 65–99)
Potassium: 4.5 mmol/L (ref 3.5–5.1)
SODIUM: 131 mmol/L — AB (ref 135–145)
Total Bilirubin: 0.5 mg/dL (ref 0.3–1.2)
Total Protein: 7.4 g/dL (ref 6.5–8.1)

## 2016-03-19 LAB — IRON AND TIBC
Iron: 46 ug/dL (ref 28–170)
SATURATION RATIOS: 12 % (ref 10.4–31.8)
TIBC: 377 ug/dL (ref 250–450)
UIBC: 331 ug/dL

## 2016-03-19 LAB — VITAMIN B12: VITAMIN B 12: 1369 pg/mL — AB (ref 180–914)

## 2016-03-19 LAB — CBC
HCT: 27.6 % — ABNORMAL LOW (ref 36.0–46.0)
Hemoglobin: 9.2 g/dL — ABNORMAL LOW (ref 12.0–15.0)
MCH: 30.3 pg (ref 26.0–34.0)
MCHC: 33.3 g/dL (ref 30.0–36.0)
MCV: 90.8 fL (ref 78.0–100.0)
PLATELETS: 246 10*3/uL (ref 150–400)
RBC: 3.04 MIL/uL — AB (ref 3.87–5.11)
RDW: 18.7 % — AB (ref 11.5–15.5)
WBC: 8.4 10*3/uL (ref 4.0–10.5)

## 2016-03-19 LAB — HEPARIN LEVEL (UNFRACTIONATED): HEPARIN UNFRACTIONATED: 0.3 [IU]/mL (ref 0.30–0.70)

## 2016-03-19 LAB — PROTIME-INR
INR: 1.37 (ref 0.00–1.49)
PROTHROMBIN TIME: 17 s — AB (ref 11.6–15.2)

## 2016-03-19 MED ORDER — HEPARIN (PORCINE) IN NACL 100-0.45 UNIT/ML-% IJ SOLN
750.0000 [IU]/h | INTRAMUSCULAR | Status: DC
  Administered 2016-03-19 (×2): 750 [IU]/h via INTRAVENOUS
  Filled 2016-03-19 (×4): qty 250

## 2016-03-19 MED ORDER — WARFARIN SODIUM 5 MG PO TABS
9.0000 mg | ORAL_TABLET | Freq: Once | ORAL | Status: AC
  Administered 2016-03-19: 9 mg via ORAL
  Filled 2016-03-19: qty 1

## 2016-03-19 NOTE — Progress Notes (Addendum)
ANTICOAGULATION CONSULT NOTE - Follow Up Consult  Pharmacy Consult:  Heparin / Coumadin Indication:   Afib + St. Jude mechanical MVR  Allergies  Allergen Reactions  . Codeine Nausea And Vomiting    Patient Measurements: Height: 5\' 1"  (154.9 cm) Weight: 146 lb 8 oz (66.452 kg) IBW/kg (Calculated) : 47.8 Heparin Dosing Weight: 65 kg  Vital Signs: Temp: 98.5 F (36.9 C) (06/29 0431) Temp Source: Oral (06/29 0431) BP: 102/49 mmHg (06/29 0431) Pulse Rate: 74 (06/29 0431)  Labs:  Recent Labs  03/17/16 0422 03/17/16 0755 03/18/16 0509 03/19/16 0415 03/19/16 0505  HGB 8.8*  --   --   --  9.2*  HCT 27.3*  --   --   --  27.6*  PLT 203  --   --   --  246  LABPROT 16.7*  --  16.6* 17.0*  --   INR 1.34  --  1.33 1.37  --   HEPARINUNFRC  --  0.51 0.45 0.30  --   CREATININE 1.19*  --  1.13* 1.16*  --     Estimated Creatinine Clearance: 37.1 mL/min (by C-G formula based on Cr of 1.16).     Assessment: 53 YOM with history of Afib and St. Jude mechanical MVR on Coumadin PTA with a lower INR goal due to history of SDH and liver hemorrhage.  She was transitioned to IV heparin on admit for cath, and heparin and Coumadin were resumed post cath on 03/16/16.  Heparin level is therapeutic and has trended down.  INR remains sub-therapeutic despite higher Coumadin doses than at home.  Noted she received Vitamin K on 03/12/16.  FOB+ and GI consulted.   Goal of Therapy:  Heparin level 0.3-0.7 units/ml  INR 2 - 2.5 per Cards Monitor platelets by anticoagulation protocol: Yes    Plan:  - Increase heparin gtt slightly to 750 units/hr - Increase Coumadin to 9mg  PO today - Daily HL / CBC / PT / INR - F/U GI consult    Clinton Dragone D. Mina Marble, PharmD, BCPS Pager:  [phone] 03/19/2016, 7:54 AM

## 2016-03-19 NOTE — Progress Notes (Signed)
Pt's fecal occult blood came back positive. Pt on heparin drip. Np on call made aware, morning labs added. Will cont to monitor pt.

## 2016-03-19 NOTE — Consult Note (Signed)
[facility] Consult: 11:15 AM 03/19/2016  LOS: 8 days    Referring Provider: Dr Erlinda Hong  Primary Care Physician:  Elyn Peers, MD Primary Gastroenterologist:  Dr.      Luiz Iron for Consultation:  Anemia, FOBT +.     HPI: Carrie [name] is a 75 y.o. female.  On chronic Coumadin since 1998 mechanical MVR, also has A fib.  Mechanical valve endocarditis in 2012.  Moderate to severe tricuspid regurge. SSS 2008 s/p pacemaker.  CHF (EF 03/11/16: 45 to 50%, severe TR on echo).  Subarachnoid hemorrhage 08/2011.  Hepatic and peritoneal hemorrhage with IR coil embolization of left hepatic artery 02/2012   Complex liver cysts, small GB polyp on ultrasound in 02/2013.  Due to issues with bleeding, cardiology maintains INR in 2 to 2.5 range.  08/2006 Colonoscopy for hx adenomatous polyps (unknown when) Dr Lyla Son: transverse and sigmoid diverticulosis.  No polyps  Admitted 8 days ago with chest pain, nausea, sweats.  No relief with SL NTG.  Troponins peaked at 5.32 on 03/11/16.  Ruled in for NSTEMI.   No CAD by cath.     12/03/15 Hgb was 14.4... 3/20: 11.7... 6/21: 11... 6/25: 9.3.Marland KitchenMarland Kitchen 6/26: 9.7...  6/27: 8.8... 6/29 9.2  MCV, platelets normal.  Iron/anemia panel normal.  FOBT + stool on 6/28 but no melena, BPR.   INR max of 2.3 on 6/21, 1.3 in last 3 days.    ROS + for easy fatigue.  No iron, B12 supplements in past or recently.  No black, maroon, bloody stool.  No dyspepsia, pyrosis, dysphagia, use of NSAIDs.  No abdominal pain.  Chest pain resolved. Stable weight.  No anorexia.  Breathing stable.  No significant DOE, no cough.  No nose bleeds.  Bruises easily but no large hematomas.  No falls.     Past Medical History  Diagnosis Date  . Hypertension   . Atrial fibrillation ([place])     normal coronaries cath 2004. Dr Rex Kras  [facility]   . Sick sinus syndrome (HCC)     Dr Crissie Sickles. EP study negative. Pacemaker 12/15/06 Medtronic  . Hyperlipidemia   . Anxiety   . Diverticula of colon   . Hemorrhage intraabdominal 03/17/2012  . 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 Jude mechanical prosthesis. LA severe dilated  . Subdural hematoma ([place])   . Liver hemorrhage   . Atrial flutter ([place])   . Pacemaker     medtronic adapta  . Heart murmur   . CHF (congestive heart failure) ([place])   . Pneumonia 2009    resolved.? OPD Rx  . Exertional shortness of breath   . History of blood transfusion     "once" (03/15/2013)  . GERD (gastroesophageal reflux disease)   . Migraines   . Stroke [facility])     "they say I had a stroke last year" denies residual on 03/15/2013  . Arthritis     "right shoulder" (03/15/2013)  . Anticoagulated on Coumadin     for mech valve and atrial fib  goal 2.0-2.5  Past Surgical History  Procedure Laterality Date  . Mitral valve replacement  1998    St Jude mechanical; Dr. [name]  . Appendectomy    . Tubal ligation    . Tee without cardioversion  09/23/2011    Procedure: TRANSESOPHAGEAL ECHOCARDIOGRAM (TEE);  Surgeon: [name];  Location: [facility] ENDOSCOPY;  Service: Cardiovascular;  Laterality: N/A;  . Insert / replace / remove pacemaker  12/15/2006    Medtronic  . US echocardiography  11/19/2011    EF 50-55%,RA mod to severely dilated,LA severely dilated,trace MR,small vegetation or mass on the MV,AOV mildly scleroticmild PI, RV pressure 40-81mmHg  . Persantine cardiolite  08/07/03    mild inf. ischemia   . Tonsillectomy    . Exploratory laparotomy      "had a growth on my intestines" (03/15/2013)  . Cataract extraction w/ intraocular lens  implant, bilateral Bilateral 2013  . Dilation and curettage of uterus      "had fibroids" (03/15/2013)  . Cardiac catheterization  04/02/97    R&L:severe MR/pulmonary hypertension  . Hammer toe surgery Right   . Pacemaker  placement  12/15/06    medtronic adapta for SSS  . Permanent pacemaker generator change N/A 07/24/2014    Procedure: PERMANENT PACEMAKER GENERATOR CHANGE;  Surgeon: [name], MD;  Location: [place] CATH LAB;  Service: Cardiovascular;  Laterality: N/A;  . Cardiac catheterization N/A 03/16/2016    Procedure: Left Heart Cath and Coronary Angiography;  Surgeon: [name], MD;  Location: [place] CV LAB;  Service: Cardiovascular;  Laterality: N/A;    Prior to Admission medications   Medication Sig Start Date End Date Taking? Authorizing Provider  Biotin 1000 MCG tablet Take 1,000 mcg by mouth daily.   Yes Historical Provider, MD  CARTIA XT 120 MG 24 hr capsule TAKE ONE CAPSULE BY MOUTH DAILY 02/24/16  Yes Mihai Croitoru, MD  chlorthalidone (HYGROTON) 25 MG tablet TAKE 1/2 TABLET BY MOUTH DAILY 02/24/16  Yes Mihai Croitoru, MD  furosemide (LASIX) 20 MG tablet TAKE 1 TABLET BY MOUTH DAILY 11/26/15  Yes Mihai Croitoru, MD  metoprolol tartrate (LOPRESSOR) 25 MG tablet Take 1 tablet (25 mg total) by mouth 2 (two) times daily. Patient taking differently: Take 12.5 mg by mouth 2 (two) times daily.  11/15/15  Yes Mihai Croitoru, MD  Multiple Vitamin (MULTIVITAMINS PO) Take 1 tablet by mouth daily.    Yes Historical Provider, MD  Omega-3 Fatty Acids (FISH OIL) 1000 MG CAPS Take 1,000 mg by mouth daily.   Yes Historical Provider, MD  polyethylene glycol (MIRALAX / GLYCOLAX) packet Take 17 g by mouth as needed (for constipation).    Yes Historical Provider, MD  potassium chloride SA (K-DUR,KLOR-CON) 20 MEQ tablet Take 1 tablet (20 mEq total) by mouth daily. 11/15/15  Yes Mihai Croitoru, MD  warfarin (COUMADIN) 5 MG tablet Take 2.5-5 mg by mouth daily. Take 1/2 tablet on Sunday then take 1 tablet all the other days   Yes Historical Provider, MD  warfarin (COUMADIN) 5 MG tablet TAKE 1 TABLET BY MOUTH DAILY AS DIRECTED Patient not taking: Reported on 03/11/2016 09/26/15   Dani Gobble Croitoru, MD    Scheduled  Meds: . atorvastatin  80 mg Oral q1800  . diltiazem  120 mg Oral Daily  . metoprolol tartrate  12.5 mg Oral BID  . pantoprazole  40 mg Oral Daily  . senna-docusate  2 tablet Oral BID  . sodium chloride flush  3 mL Intravenous Q12H  . warfarin  9 mg Oral  ONCE-1800  . Warfarin - Pharmacist Dosing Inpatient   Does not apply q1800   Infusions: . heparin 750 Units/hr (03/19/16 0817)   PRN Meds: sodium chloride, acetaminophen, gi cocktail, morphine injection, ondansetron (ZOFRAN) IV, sodium chloride flush   Allergies as of 03/11/2016 - Review Complete 03/11/2016  Allergen Reaction Noted  . Codeine Nausea And Vomiting     Family History  Problem Relation Age of Onset  . Cancer Mother   . Stroke Father   . Cancer Sister   . Leukemia Sister   . Healthy Brother   . Healthy Sister   . Diabetes Sister   . Healthy Brother   . Healthy Brother   . Heart attack Maternal Grandfather   . Kidney disease Daughter   . Stroke Brother     Social History   Social History  . Marital Status: Married    Spouse Name: N/A  . Number of Children: N/A  . Years of Education: N/A   Occupational History  . Not on file.   Social History Main Topics  . Smoking status: Never Smoker   . Smokeless tobacco: Never Used  . Alcohol Use: No  . Drug Use: No  . Sexual Activity: No   Other Topics Concern  . Not on file   Social History Narrative    REVIEW OF SYSTEMS: Per HPI.  12 systems reviewed.    PHYSICAL EXAM: Vital signs in last 24 hours: Filed Vitals:   03/18/16 1955 03/19/16 0431  BP: 113/58 102/49  Pulse: 73 74  Temp: 98.7 F (37.1 C) 98.5 F (36.9 C)  Resp: 16 16   Wt Readings from Last 3 Encounters:  03/19/16 66.452 kg (146 lb 8 oz)  02/11/16 67.042 kg (147 lb 12.8 oz)  12/09/15 68.13 kg (150 lb 3.2 oz)    General: pleasant, well-appearing AAF.   comfortable Head:  No asymmetry or trauma.  No swelling  Eyes:  No icterus, slight conj pallor Ears:  Not HOH  Nose:  No  congestion or discharge Mouth:  Moist and clear oral mm.  Good dentition with at least 1 missing tooth. Neck:  No mass or TMG.  No JVD Lungs:  Clear bil.  No cough or SOB Heart: irreg, irreg, rate controlled.  Mechanical valve click and 2 to 3/6 murmer Abdomen:  Soft, active BS.  NT, ND.  No HSM or mass, no bruits or hernias.  .   Rectal: small amount of firm, brown, FOBT negative stool.  No mass.  Small external hemorrhoid.   Musc/Skeltl: no joint swelling, erythema, or gross deformities.  Extremities:  No CCE  Neurologic:  Alert, oriented x 3.  No tremor.  No limb weakness Skin:  No rash or sores.  Antecubital hematoma on right Psych:  Pleasant, calm.    Intake/Output from previous day: 06/28 0701 - 06/29 0700 In: 720 [P.O.:720] Out: 1650 [Urine:1650] Intake/Output this shift: Total I/O In: 240 [P.O.:240] Out: -   LAB RESULTS:  Recent Labs  03/17/16 0422 03/19/16 0505  WBC 7.0 8.4  HGB 8.8* 9.2*  HCT 27.3* 27.6*  PLT 203 246   BMET Lab Results  Component Value Date   NA 131* 03/19/2016   NA 133* 03/18/2016   NA 132* 03/17/2016   K 4.5 03/19/2016   K 4.5 03/18/2016   K 4.7 03/17/2016   CL 103 03/19/2016   CL 103 03/18/2016   CL 105 03/17/2016   CO2 20* 03/19/2016   CO2 21* 03/18/2016  CO2 21* 03/17/2016   GLUCOSE 87 03/19/2016   GLUCOSE 96 03/18/2016   GLUCOSE 103* 03/17/2016   BUN 23* 03/19/2016   BUN 23* 03/18/2016   BUN 25* 03/17/2016   CREATININE 1.16* 03/19/2016   CREATININE 1.13* 03/18/2016   CREATININE 1.19* 03/17/2016   CALCIUM 9.2 03/19/2016   CALCIUM 9.4 03/18/2016   CALCIUM 9.5 03/17/2016   LFT  Recent Labs  03/19/16 0415  PROT 7.4  ALBUMIN 3.1*  AST 100*  ALT 23  ALKPHOS 102  BILITOT 0.5   PT/INR Lab Results  Component Value Date   INR 1.37 03/19/2016   INR 1.33 03/18/2016   INR 1.34 03/17/2016   Hepatitis Panel No results for input(s): HEPBSAG, HCVAB, HEPAIGM, HEPBIGM in the last 72 hours. C-Diff No components found  for: CDIFF Lipase     Component Value Date/Time   LIPASE 50 03/15/2013 1610    Drugs of Abuse  No results found for: LABOPIA, COCAINSCRNUR, LABBENZ, AMPHETMU, THCU, LABBARB   RADIOLOGY STUDIES: No results found.  ENDOSCOPIC STUDIES: Per HPI  IMPRESSION:   *  Anemia, steady decline Hgb since admission.  1 of 2 FOBT + assays, no overt melena etc.  GI ROS unremarkable.  No PPI at home but now on oral Protonix since 6/22.    *  Mechanical MVR.  Chronic Coumadin.    *  NonSTEMI.  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    [facility] 2012 and liver hemorrhage (coil embolization left hepatic artery 2013)   *  Remote hx of adenomatous colon polyps (year unknown).  Tics and no polyps on surveillance colonoscopy 08/2006.    PLAN:     *  Per Dr Silverio Decamp.    *  CBC in AM.     Azucena Freed  03/19/2016, 11:15 AM Pager: [phone]

## 2016-03-19 NOTE — Progress Notes (Signed)
PROGRESS NOTE    Carrie [name]  [id number]  DOB: January 05, 1941  DOA: 03/11/2016 PCP: Carrie Peers, MD Outpatient Specialists:   Hospital course: Carrie [name] is a 75 y.o. 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  The patient was in her normal health until tonight at midnight while watching TV when she had onset of left sided chest pressure. The pain was located in the left chest, radiating to the left arm. It was associated with nausea and "feeling very uncomfortable". It was constant and pressure like and unlike any sensation she had had before. Her husband called 9-1-1 and she was transported here. Pt for cardiac cath on 6/26.  No CAD found.  Pt has mechanical mitral valve and would need INR >2 before discharge.    Assessment & Plan:   1. Chest pain/NSTEMI The patient presented with HEART score of 6.Troponins have been elevated and planning for cath 6/26.  Pt reports resolution of chest pain.  Continue to follow and monitor closely.  IV heparin.  metoprolol.  No CAD found on cath.  Patient is  on IV heparin with plans to get INR 2-2.5, before discharge home because of mechanical St. Jude Mitral Valve.    2. HTN:  -metroprolol 12.5 BID and cardizem 120mg  po qd  3. Afib and SSS with pacer:  CHADS2Vasc 5 with chf/htn/age/h/ocva. On warfarin at home, INR goal 2.0-2.5 given hx of SAH. But holding warfarin for cath.   On IV heparin and warfarin at this time.  - continue home diltiazem and metoprolol, she does have elevation of HR with ambulation, will monitor  4. Chronic diastolic CHF:  - clinically appears compensated, holding lasix for now due to acute renal insufficiency.  No edema, lung clear today on 6/28,  Cr remains mildly elevated, continue hold lasix,   5. Anemia:  Mild Thrombocytopenia presented on admission has  resolved. Normocytic, hgb trending down, reported feeling tired,  anemia work up with FOBT+, gi consulted   6. Hypokalemia: -Repleted orally  7. Hyponatremia - chronic - unchanged, stable, suspect this is secondary to thiazide and lasix, holding both for now.   8.  Renal Insufficiency - acute  - likely secondary to diuretic use,  lasix  Held since admission,  Resume in 1-2 days   Code Status: Full  Consults called: Cardiology, GI Disposition: home in 1-2 days, inr not therapeutic, anemia work up, cr and lasix dosing need to be determined prior to discharge  Procedures:  Cath 6/26 - see reports  Subjective: Pt denies any complaints today.   hgb decreasing, no sign of blood loss, reported baseline dyspnea on exertion. No chest pain today.   Objective: Filed Vitals:   03/18/16 1535 03/18/16 1955 03/19/16 0431 03/19/16 1409  BP: 106/57 113/58 102/49 99/45  Pulse: 46 73 74 98  Temp: 98.6 F (37 C) 98.7 F (37.1 C) 98.5 F (36.9 C) 98.3 F (36.8 C)  TempSrc: Oral Oral Oral Oral  Resp: 18 16 16 15   Height:      Weight:   66.452 kg (146 lb 8 oz)   SpO2: 97% 99% 97% 98%    Intake/Output Summary (Last 24 hours) at 03/19/16 1555 Last data filed at 03/19/16 1259  Gross per 24 hour  Intake    720 ml  Output   1150 ml  Net   -  430 ml   Filed Weights   03/17/16 0543 03/18/16 0530 03/19/16 0431  Weight: 65.137 kg (143 lb 9.6 oz) 65.953 kg (145 lb 6.4 oz) 66.452 kg (146 lb 8 oz)    Exam:  General appearance: Well-developed, elderly adult female, alert and in no acute distress. Skin: Warm and dry.  Cardiac: RRR, nl S1-S2, no murmurs appreciated, mechanical sounding valve. Capillary refill is brisk Respiratory: Normal respiratory rate and rhythm. CTAB without rales or wheezes. Abdomen: Abdomen soft without rigidity. No TTP.No ascites, distension.  MSK: No deformities or effusions. Pain not reproduced with palpation of precordium. No pain with arm movement. Neuro:  Sensorium intact and responding to questions, attention normal. Speech is fluent. Moves all extremities equally and with normal coordination.  Psych: Behavior appropriate. Affect normal. No evidence of aural or visual hallucinations or delusions.   Data Reviewed: Basic Metabolic Panel:  Recent Labs Lab 03/15/16 0723 03/16/16 0533 03/17/16 0422 03/18/16 0509 03/19/16 0415  NA 133* 132* 132* 133* 131*  K 4.5 4.9 4.7 4.5 4.5  CL 102 104 105 103 103  CO2 22 21* 21* 21* 20*  GLUCOSE 98 99 103* 96 87  BUN 24* 25* 25* 23* 23*  CREATININE 1.09* 1.12* 1.19* 1.13* 1.16*  CALCIUM 10.1 9.8 9.5 9.4 9.2   Liver Function Tests:  Recent Labs Lab 03/19/16 0415  AST 100*  ALT 23  ALKPHOS 102  BILITOT 0.5  PROT 7.4  ALBUMIN 3.1*   No results for input(s): LIPASE, AMYLASE in the last 168 hours. No results for input(s): AMMONIA in the last 168 hours. CBC:  Recent Labs Lab 03/13/16 0438 03/14/16 0225 03/15/16 0318 03/16/16 0533 03/17/16 0422 03/19/16 0505  WBC 7.3 7.2 7.5 6.8 7.0 8.4  NEUTROABS 4.1  --   --   --   --   --   HGB 9.3* 9.1* 9.3* 9.7* 8.8* 9.2*  HCT 28.7* 28.1* 27.8* 29.6* 27.3* 27.6*  MCV 89.7 89.8 88.8 88.9 89.5 90.8  PLT 223 215 237 245 203 246   Cardiac Enzymes: No results for input(s): CKTOTAL, CKMB, CKMBINDEX, TROPONINI in the last 168 hours. BNP (last 3 results) No results for input(s): PROBNP in the last 8760 hours. CBG: No results for input(s): GLUCAP in the last 168 hours.  No results found for this or any previous visit (from the past 240 hour(s)).   Studies: No results found.   Scheduled Meds: . atorvastatin  80 mg Oral q1800  . diltiazem  120 mg Oral Daily  . metoprolol tartrate  12.5 mg Oral BID  . pantoprazole  40 mg Oral Daily  . senna-docusate  2 tablet Oral BID  . sodium chloride flush  3 mL Intravenous Q12H  . warfarin  9 mg Oral ONCE-1800  . Warfarin - Pharmacist Dosing Inpatient   Does not apply q1800   Continuous  Infusions: . heparin 750 Units/hr (03/19/16 0817)    Principal Problem:   NSTEMI (non-ST elevated myocardial infarction) ([place]) Active Problems:   S/P mitral valve replacement, St Jude   Chronic anticoagulation, ( INR goal 2.0-2.5 due to history of subdural hematoma and liver hemorrhage)   HTN (hypertension)   Atrial fibrillation with slow ventricular response (HCC)   Moderate to severe tricuspid regurgitation   Hyponatremia   AKI (acute kidney injury) ([place])   Anemia due to other cause  Time spent: 72mins  Lauri Till, MD, PhD [facility] Pager [phone]  If 7PM-7AM, please contact night-coverage [email or web address] Password [facility] 03/19/2016, 3:55 PM  LOS: 8 days

## 2016-03-19 NOTE — Care Management Note (Addendum)
Case Management Note  Patient Details  Name: PRAGNA SCHLINGER MRN: [id number] Date of Birth: December 21, 1940  Subjective/Objective: Hx Mechanical Valve and must have INR > 2.0 at discharge. INR currently 1.33.                    Action/Plan: CM will be available should additional disposition needs arise.    Expected Discharge Date:  03/14/16               Expected Discharge Plan:  Home/Self Care  In-House Referral:  NA  Discharge planning Services  CM Consult  Post Acute Care Choice:  NA Choice offered to:  NA  DME Arranged:  N/A DME Agency:  NA  HH Arranged:  NA HH Agency:  NA  Status of Service:  Completed, signed off  If discussed at [place] of Stay Meetings, dates discussed:  03/19/2016  Additional Comments:  Delrae Sawyers, RN 03/19/2016, 11:13 AM

## 2016-03-20 ENCOUNTER — Encounter: Payer: Self-pay | Admitting: Gastroenterology

## 2016-03-20 DIAGNOSIS — E871 Hypo-osmolality and hyponatremia: Secondary | ICD-10-CM

## 2016-03-20 LAB — BASIC METABOLIC PANEL
Anion gap: 7 (ref 5–15)
BUN: 18 mg/dL (ref 6–20)
CO2: 20 mmol/L — ABNORMAL LOW (ref 22–32)
CREATININE: 1.04 mg/dL — AB (ref 0.44–1.00)
Calcium: 9.1 mg/dL (ref 8.9–10.3)
Chloride: 103 mmol/L (ref 101–111)
GFR, EST AFRICAN AMERICAN: 60 mL/min — AB (ref >60)
GFR, EST NON AFRICAN AMERICAN: 52 mL/min — AB (ref >60)
Glucose, Bld: 94 mg/dL (ref 65–99)
POTASSIUM: 4.2 mmol/L (ref 3.5–5.1)
SODIUM: 130 mmol/L — AB (ref 135–145)

## 2016-03-20 LAB — CBC
HCT: 25.3 % — ABNORMAL LOW (ref 36.0–46.0)
Hemoglobin: 8.3 g/dL — ABNORMAL LOW (ref 12.0–15.0)
MCH: 30 pg (ref 26.0–34.0)
MCHC: 32.8 g/dL (ref 30.0–36.0)
MCV: 91.3 fL (ref 78.0–100.0)
PLATELETS: 236 10*3/uL (ref 150–400)
RBC: 2.77 MIL/uL — AB (ref 3.87–5.11)
RDW: 18.9 % — ABNORMAL HIGH (ref 11.5–15.5)
WBC: 6.6 10*3/uL (ref 4.0–10.5)

## 2016-03-20 LAB — PROTIME-INR
INR: 1.5 — AB (ref 0.00–1.49)
PROTHROMBIN TIME: 18.1 s — AB (ref 11.6–15.2)

## 2016-03-20 LAB — FOLATE RBC
FOLATE, RBC: 1517 ng/mL (ref >498)
Folate, Hemolysate: 414.1 ng/mL
HEMATOCRIT: 27.3 % — AB (ref 34.0–46.6)

## 2016-03-20 LAB — HEPARIN LEVEL (UNFRACTIONATED): HEPARIN UNFRACTIONATED: 0.51 [IU]/mL (ref 0.30–0.70)

## 2016-03-20 LAB — OCCULT BLOOD X 1 CARD TO LAB, STOOL: Fecal Occult Bld: NEGATIVE

## 2016-03-20 MED ORDER — FUROSEMIDE 20 MG PO TABS
20.0000 mg | ORAL_TABLET | Freq: Every day | ORAL | Status: DC
  Administered 2016-03-21 – 2016-03-23 (×3): 20 mg via ORAL
  Filled 2016-03-20 (×3): qty 1

## 2016-03-20 MED ORDER — WARFARIN SODIUM 5 MG PO TABS
9.0000 mg | ORAL_TABLET | Freq: Once | ORAL | Status: AC
  Administered 2016-03-20: 9 mg via ORAL
  Filled 2016-03-20: qty 1

## 2016-03-20 NOTE — Care Management Important Message (Signed)
Important Message  Patient Details  Name: Carrie [name] MRN: [id number] Date of Birth: 01-18-1941   Medicare Important Message Given:  Yes    Nathen May 03/20/2016, 12:04 PM

## 2016-03-20 NOTE — Progress Notes (Signed)
PROGRESS NOTE    Carrie [name]  [id number]  DOB: [birth year]  DOA: 03/11/2016 PCP: Elyn Peers, MD Outpatient Specialists:   Hospital course: KYLLA [name] is a 75 y.o. 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  The patient was in her normal health until tonight at midnight while watching TV when she had onset of left sided chest pressure. The pain was located in the left chest, radiating to the left arm. It was associated with nausea and "feeling very uncomfortable". It was constant and pressure like and unlike any sensation she had had before. Her husband called 9-1-1 and she was transported here. Pt for cardiac cath on 6/26.  No CAD found.  Pt has mechanical mitral valve and would need INR >2 before discharge.    Assessment & Plan:   1. Chest pain/NSTEMI The patient presented with HEART score of 6.Troponins have been elevated , cath 6/26 no CAD.  Pt reports resolution of chest pain.  Continue metoprolol/statin.  Patient is  on IV heparin with plans to get INR 2-2.5, before discharge home because of mechanical St. Jude Mitral Valve.    2. HTN:  -metroprolol 12.5 BID and cardizem 120mg  po qd  3. Afib and SSS with pacer:  CHADS2Vasc 5 with chf/htn/age/h/ocva. On warfarin at home, INR goal 2.0-2.5 given hx of SAH and liver hemorrhage. warfarin held for cath.   now On IV heparin and warfarin restarted.  - continue home diltiazem and metoprolol, she does have elevation of HR with ambulation, will monitor  4. Chronic diastolic CHF:  - clinically appears compensated,  lasix held since admission due to acute renal insufficiency.  -restart lasix on 7/1   5. Anemia:  Mild Thrombocytopenia presented on admission has resolved. Normocytic, hgb trending down, reported feeling tired,  anemia work up with FOBT+, gi consulted who  recommended outpatient egd/colonoscopy in 6-8 weeks.   6. Hypokalemia: -Repleted orally  7. Hyponatremia - chronic - unchanged, stable, suspect this is secondary to thiazide and lasix,  both held since admission.   8.  Renal Insufficiency - acute  - likely secondary to diuretic use,  lasix  Held since admission,  Resume on 7/1  Code Status: Full  Consults called: Cardiology, GI Disposition: home in 1-2 days, inr not therapeutic, anemia work up,   Procedures:  Cath 6/26 - see reports  Subjective: Pt denies any complaints today.   hgb decreasing, no sign of significant blood loss, reported baseline dyspnea on exertion. No chest pain today.   Objective: Filed Vitals:   03/19/16 1409 03/19/16 1937 03/19/16 2108 03/20/16 0437  BP: 99/45 120/51  111/54  Pulse: 98 76 70 70  Temp: 98.3 F (36.8 C) 98.2 F (36.8 C)  98.1 F (36.7 C)  TempSrc: Oral Oral  Oral  Resp: 15 16  16   Height:      Weight:    66.996 kg (147 lb 11.2 oz)  SpO2: 98% 98%  97%    Intake/Output Summary (Last 24 hours) at 03/20/16 0733 Last data filed at 03/20/16 0000  Gross per 24 hour  Intake    960 ml  Output    400 ml  Net    560 ml   Filed Weights   03/18/16 0530 03/19/16 0431 03/20/16 0437  Weight: 65.953 kg (145 lb 6.4 oz) 66.452 kg (146 lb 8  oz) 66.996 kg (147 lb 11.2 oz)    Exam:  General appearance: Well-developed, elderly adult female, alert and in no acute distress. Skin: Warm and dry.  Cardiac: RRR, nl S1-S2, no murmurs appreciated, mechanical sounding valve. Capillary refill is brisk Respiratory: Normal respiratory rate and rhythm. CTAB without rales or wheezes. Abdomen: Abdomen soft without rigidity. No TTP.No ascites, distension.  MSK: No deformities or effusions. Pain not reproduced with palpation of precordium. No pain with arm movement. Neuro: Sensorium intact and responding to questions, attention normal. Speech is fluent. Moves all extremities equally and with normal  coordination.  Psych: Behavior appropriate. Affect normal. No evidence of aural or visual hallucinations or delusions.   Data Reviewed: Basic Metabolic Panel:  Recent Labs Lab 03/16/16 0533 03/17/16 0422 03/18/16 0509 03/19/16 0415 03/20/16 0430  NA 132* 132* 133* 131* 130*  K 4.9 4.7 4.5 4.5 4.2  CL 104 105 103 103 103  CO2 21* 21* 21* 20* 20*  GLUCOSE 99 103* 96 87 94  BUN 25* 25* 23* 23* 18  CREATININE 1.12* 1.19* 1.13* 1.16* 1.04*  CALCIUM 9.8 9.5 9.4 9.2 9.1   Liver Function Tests:  Recent Labs Lab 03/19/16 0415  AST 100*  ALT 23  ALKPHOS 102  BILITOT 0.5  PROT 7.4  ALBUMIN 3.1*   No results for input(s): LIPASE, AMYLASE in the last 168 hours. No results for input(s): AMMONIA in the last 168 hours. CBC:  Recent Labs Lab 03/15/16 0318 03/16/16 0533 03/17/16 0422 03/19/16 0505 03/20/16 0430  WBC 7.5 6.8 7.0 8.4 6.6  HGB 9.3* 9.7* 8.8* 9.2* 8.3*  HCT 27.8* 29.6* 27.3* 27.6* 25.3*  MCV 88.8 88.9 89.5 90.8 91.3  PLT 237 245 203 246 236   Cardiac Enzymes: No results for input(s): CKTOTAL, CKMB, CKMBINDEX, TROPONINI in the last 168 hours. BNP (last 3 results) No results for input(s): PROBNP in the last 8760 hours. CBG: No results for input(s): GLUCAP in the last 168 hours.  No results found for this or any previous visit (from the past 240 hour(s)).   Studies: No results found.   Scheduled Meds: . atorvastatin  80 mg Oral q1800  . diltiazem  120 mg Oral Daily  . metoprolol tartrate  12.5 mg Oral BID  . pantoprazole  40 mg Oral Daily  . senna-docusate  2 tablet Oral BID  . sodium chloride flush  3 mL Intravenous Q12H  . Warfarin - Pharmacist Dosing Inpatient   Does not apply q1800   Continuous Infusions: . heparin 750 Units/hr (03/19/16 1707)    Principal Problem:   NSTEMI (non-ST elevated myocardial infarction) ([place]) Active Problems:   S/P mitral valve replacement, St Jude   Chronic anticoagulation, ( INR goal 2.0-2.5 due to  history of subdural hematoma and liver hemorrhage)   HTN (hypertension)   Atrial fibrillation with slow ventricular response (HCC)   Moderate to severe tricuspid regurgitation   Hyponatremia   AKI (acute kidney injury) ([place])   Anemia due to other cause  Time spent: 35mins  Haeli Gerlich, MD, PhD [facility] Pager [phone]  If 7PM-7AM, please contact night-coverage [email or web address] Password [email or web address] 03/20/2016, 7:33 AM    LOS: 9 days

## 2016-03-20 NOTE — Progress Notes (Signed)
ANTICOAGULATION CONSULT NOTE - Follow Up Consult  Pharmacy Consult:  Heparin / Coumadin Indication:   Afib + St. Jude mechanical MVR  Allergies  Allergen Reactions  . Codeine Nausea And Vomiting    Patient Measurements: Height: 5\' 1"  (154.9 cm) Weight: 147 lb 11.2 oz (66.996 kg) IBW/kg (Calculated) : 47.8 Heparin Dosing Weight: 65 kg  Vital Signs: Temp: 98.1 F (36.7 C) (06/30 0437) Temp Source: Oral (06/30 0437) BP: 111/54 mmHg (06/30 0437) Pulse Rate: 70 (06/30 0437)  Labs:  Recent Labs  03/18/16 0509 03/19/16 0415 03/19/16 0505 03/20/16 0430  HGB  --   --  9.2* 8.3*  HCT  --   --  27.6* 25.3*  PLT  --   --  246 236  LABPROT 16.6* 17.0*  --  18.1*  INR 1.33 1.37  --  1.50*  HEPARINUNFRC 0.45 0.30  --  0.51  CREATININE 1.13* 1.16*  --  1.04*    Estimated Creatinine Clearance: 41.6 mL/min (by C-G formula based on Cr of 1.04).     Assessment: 64 YOM with history of Afib and St. Jude mechanical MVR on Coumadin PTA with a lower INR goal due to history of SDH and liver hemorrhage.  She was transitioned to IV heparin on admit for cath, and heparin and Coumadin were resumed post cath on 03/16/16.  Heparin level is therapeutic and INR is starting to trend up.  Patient was +FOB on 03/18/16 and no plan for intervention given no overt bleeding per GI.   Goal of Therapy:  Heparin level 0.3-0.7 units/ml  INR 2 - 2.5 per Cards Monitor platelets by anticoagulation protocol: Yes    Plan:  - Continue heparin gtt at 750 units/hr - Repeat Coumadin to 9mg  PO today - Daily HL / CBC / PT / INR   Alanny Rivers D. Mina Marble, PharmD, BCPS Pager:  [phone] 03/20/2016, 7:59 AM

## 2016-03-21 DIAGNOSIS — D649 Anemia, unspecified: Secondary | ICD-10-CM

## 2016-03-21 LAB — PROTIME-INR
INR: 1.77 — AB (ref 0.00–1.49)
PROTHROMBIN TIME: 20.6 s — AB (ref 11.6–15.2)

## 2016-03-21 LAB — PREPARE RBC (CROSSMATCH)

## 2016-03-21 LAB — COMPREHENSIVE METABOLIC PANEL
ALT: 28 U/L (ref 14–54)
ANION GAP: 7 (ref 5–15)
AST: 106 U/L — ABNORMAL HIGH (ref 15–41)
Albumin: 3 g/dL — ABNORMAL LOW (ref 3.5–5.0)
Alkaline Phosphatase: 116 U/L (ref 38–126)
BUN: 18 mg/dL (ref 6–20)
CHLORIDE: 103 mmol/L (ref 101–111)
CO2: 20 mmol/L — AB (ref 22–32)
Calcium: 9.3 mg/dL (ref 8.9–10.3)
Creatinine, Ser: 1.11 mg/dL — ABNORMAL HIGH (ref 0.44–1.00)
GFR calc non Af Amer: 48 mL/min — ABNORMAL LOW (ref >60)
GFR, EST AFRICAN AMERICAN: 55 mL/min — AB (ref >60)
Glucose, Bld: 101 mg/dL — ABNORMAL HIGH (ref 65–99)
POTASSIUM: 4.3 mmol/L (ref 3.5–5.1)
SODIUM: 130 mmol/L — AB (ref 135–145)
Total Bilirubin: 0.7 mg/dL (ref 0.3–1.2)
Total Protein: 6.8 g/dL (ref 6.5–8.1)

## 2016-03-21 LAB — CBC
HCT: 24.5 % — ABNORMAL LOW (ref 36.0–46.0)
Hemoglobin: 8.1 g/dL — ABNORMAL LOW (ref 12.0–15.0)
MCH: 30 pg (ref 26.0–34.0)
MCHC: 33.1 g/dL (ref 30.0–36.0)
MCV: 90.7 fL (ref 78.0–100.0)
PLATELETS: 224 10*3/uL (ref 150–400)
RBC: 2.7 MIL/uL — AB (ref 3.87–5.11)
RDW: 19.2 % — AB (ref 11.5–15.5)
WBC: 6.6 10*3/uL (ref 4.0–10.5)

## 2016-03-21 LAB — HEPARIN LEVEL (UNFRACTIONATED): Heparin Unfractionated: 0.46 IU/mL (ref 0.30–0.70)

## 2016-03-21 LAB — MAGNESIUM: Magnesium: 2.1 mg/dL (ref 1.7–2.4)

## 2016-03-21 MED ORDER — FUROSEMIDE 10 MG/ML IJ SOLN
40.0000 mg | INTRAMUSCULAR | Status: AC
  Administered 2016-03-21: 40 mg via INTRAVENOUS
  Filled 2016-03-21: qty 4

## 2016-03-21 MED ORDER — SODIUM CHLORIDE 0.9 % IV SOLN
Freq: Once | INTRAVENOUS | Status: AC
  Administered 2016-03-21: 14:00:00 via INTRAVENOUS

## 2016-03-21 MED ORDER — WARFARIN SODIUM 7.5 MG PO TABS
7.5000 mg | ORAL_TABLET | Freq: Once | ORAL | Status: AC
  Administered 2016-03-21: 7.5 mg via ORAL
  Filled 2016-03-21: qty 1

## 2016-03-21 NOTE — Progress Notes (Signed)
ANTICOAGULATION CONSULT NOTE - Follow Up Consult  Pharmacy Consult:  Heparin / Coumadin Indication:   Afib + St. Jude mechanical MVR  Allergies  Allergen Reactions  . Codeine Nausea And Vomiting    Patient Measurements: Height: 5\' 1"  (154.9 cm) Weight: 147 lb 14.4 oz (67.087 kg) IBW/kg (Calculated) : 47.8 Heparin Dosing Weight: 65 kg  Vital Signs: Temp: 98.2 F (36.8 C) (07/01 0335) Temp Source: Oral (07/01 0335) BP: 112/51 mmHg (07/01 0335) Pulse Rate: 70 (07/01 0335)  Labs:  Recent Labs  03/19/16 0415  03/19/16 0505 03/19/16 1053 03/20/16 0430 03/21/16 0325 03/21/16 0605  HGB  --   < > 9.2*  --  8.3* 8.1*  --   HCT  --   --  27.6* 27.3* 25.3* 24.5*  --   PLT  --   --  246  --  236 224  --   LABPROT 17.0*  --   --   --  18.1*  --  20.6*  INR 1.37  --   --   --  1.50*  --  1.77*  HEPARINUNFRC 0.30  --   --   --  0.51 0.46  --   CREATININE 1.16*  --   --   --  1.04* 1.11*  --   < > = values in this interval not displayed.  Estimated Creatinine Clearance: 39 mL/min (by C-G formula based on Cr of 1.11).     Assessment: 56 YOM with history of Afib and St. Jude mechanical MVR on Coumadin PTA with a lower INR goal due to history of SDH and liver hemorrhage.  She was transitioned to IV heparin on admit for cath, and heparin and Coumadin were resumed post cath on 03/16/16 (no CAD noted).  Heparin level today remains therapeutic at 0.46 and INR is still SUBtherapeutic at 1.77 but now starting to trend up with increased dosing.  Slow trend up in INR likely d/t vitamin K given on 6/22. Patient was +FOB on 03/18/16 and no plan for intervention given no overt bleeding per GI.   Goal of Therapy:  Heparin level 0.3-0.7 units/ml  INR 2 - 2.5 per Cards Monitor platelets by anticoagulation protocol: Yes    Plan:  - Continue heparin gtt at 750 units/hr - Coumadin 7.5 mg PO today - Daily HL / CBC / PT / INR   Toriann Spadoni K. Velva Harman, PharmD, BCPS, CPP Clinical Pharmacist Pager:  [phone] Phone: [phone] 03/21/2016 7:46 AM

## 2016-03-21 NOTE — Progress Notes (Signed)
PROGRESS NOTE    Carrie [name]  [id number]  DOB: 12-09-1940  DOA: 03/11/2016 PCP: Elyn Peers, MD Outpatient Specialists:   Hospital course: Carrie [name] is a 75 y.o. 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  The patient was in her normal health until tonight at midnight while watching TV when she had onset of left sided chest pressure. The pain was located in the left chest, radiating to the left arm. It was associated with nausea and "feeling very uncomfortable". It was constant and pressure like and unlike any sensation she had had before. Her husband called 9-1-1 and she was transported here. Pt for cardiac cath on 6/26.  No CAD found.  Pt has mechanical mitral valve and would need INR >2 before discharge.    Assessment & Plan:   1. Chest pain/NSTEMI The patient presented with HEART score of 6.Troponins have been elevated , cath 6/26 no CAD.  Pt reports resolution of chest pain.  Continue metoprolol/statin.  Patient is  on IV heparin with plans to get INR 2-2.5, before discharge home because of mechanical St. Jude Mitral Valve.    2. HTN:  -metroprolol 12.5 BID and cardizem 120mg  po qd  3. Afib and SSS with pacer:  CHADS2Vasc 5 with chf/htn/age/h/ocva. On warfarin at home, INR goal 2.0-2.5 given hx of SAH and liver hemorrhage. warfarin held for cath.   now On IV heparin and warfarin restarted.  - continue home diltiazem and metoprolol, she does have elevation of HR with ambulation, will monitor  4. Chronic diastolic CHF:  - clinically appears compensated,  lasix held since admission due to acute renal insufficiency.  -restart home oral lasix on 7/1, additional Iv lasix given on 7/1 after prbc transfusion   5. Anemia:  Mild Thrombocytopenia presented on admission has resolved. Normocytic, hgb trending down,  reported feeling tired,  anemia work up with FOBT+, gi consulted who recommended outpatient egd/colonoscopy in 6-8 weeks. I have discussed with patient about prbc transfusion x1 unit, she agrees to it. Will give one dose of iv lasix after prbc transfusion.   6. Hypokalemia: -Repleted orally  7. Hyponatremia - chronic - unchanged, stable, suspect this is secondary to thiazide and lasix,  both held since admission.   8.  Renal Insufficiency - acute  - likely secondary to diuretic use,  lasix  Held since admission,  Resume on 7/1  Code Status: Full  Consults called: Cardiology, GI Disposition: home in 1-2 days, inr not therapeutic, anemia work up,   Procedures:  Cath 6/26 - see reports  Subjective: Pt denies any complaints today.   hgb decreasing, no sign of significant blood loss, reported baseline dyspnea on exertion. No chest pain today. INR 1.77   Objective: Filed Vitals:   03/20/16 2204 03/21/16 0335 03/21/16 0859 03/21/16 0900  BP:  112/51 105/69   Pulse: 93 70  76  Temp:  98.2 F (36.8 C)    TempSrc:  Oral    Resp:  16    Height:      Weight:  67.087 kg (147 lb 14.4 oz)    SpO2:  95%      Intake/Output Summary (Last 24 hours) at 03/21/16 1224 Last data filed at 03/21/16 0900  Gross per 24 hour  Intake    720 ml  Output    350 ml  Net    370 ml   Filed Weights   03/19/16 0431 03/20/16 0437 03/21/16 0335  Weight: 66.452 kg (146 lb 8 oz) 66.996 kg (147 lb 11.2 oz) 67.087 kg (147 lb 14.4 oz)    Exam:  General appearance: Well-developed, elderly adult female, alert and in no acute distress. Skin: Warm and dry.  Cardiac: RRR, nl S1-S2, no murmurs appreciated, mechanical sounding valve. Capillary refill is brisk Respiratory: Normal respiratory rate and rhythm. CTAB without rales or wheezes. Abdomen: Abdomen soft without rigidity. No TTP.No ascites, distension.  MSK: No deformities or effusions. Pain not reproduced with palpation of precordium. No pain  with arm movement. Neuro: Sensorium intact and responding to questions, attention normal. Speech is fluent. Moves all extremities equally and with normal coordination.  Psych: Behavior appropriate. Affect normal. No evidence of aural or visual hallucinations or delusions.   Data Reviewed: Basic Metabolic Panel:  Recent Labs Lab 03/17/16 0422 03/18/16 0509 03/19/16 0415 03/20/16 0430 03/21/16 0325  NA 132* 133* 131* 130* 130*  K 4.7 4.5 4.5 4.2 4.3  CL 105 103 103 103 103  CO2 21* 21* 20* 20* 20*  GLUCOSE 103* 96 87 94 101*  BUN 25* 23* 23* 18 18  CREATININE 1.19* 1.13* 1.16* 1.04* 1.11*  CALCIUM 9.5 9.4 9.2 9.1 9.3  MG  --   --   --   --  2.1   Liver Function Tests:  Recent Labs Lab 03/19/16 0415 03/21/16 0325  AST 100* 106*  ALT 23 28  ALKPHOS 102 116  BILITOT 0.5 0.7  PROT 7.4 6.8  ALBUMIN 3.1* 3.0*   No results for input(s): LIPASE, AMYLASE in the last 168 hours. No results for input(s): AMMONIA in the last 168 hours. CBC:  Recent Labs Lab 03/16/16 0533 03/17/16 0422 03/19/16 0505 03/19/16 1053 03/20/16 0430 03/21/16 0325  WBC 6.8 7.0 8.4  --  6.6 6.6  HGB 9.7* 8.8* 9.2*  --  8.3* 8.1*  HCT 29.6* 27.3* 27.6* 27.3* 25.3* 24.5*  MCV 88.9 89.5 90.8  --  91.3 90.7  PLT 245 203 246  --  236 224   Cardiac Enzymes: No results for input(s): CKTOTAL, CKMB, CKMBINDEX, TROPONINI in the last 168 hours. BNP (last 3 results) No results for input(s): PROBNP in the last 8760 hours. CBG: No results for input(s): GLUCAP in the last 168 hours.  No results found for this or any previous visit (from the past 240 hour(s)).   Studies: No results found.   Scheduled Meds: . sodium chloride   Intravenous Once  . atorvastatin  80 mg Oral q1800  . diltiazem  120 mg Oral Daily  . furosemide  40 mg Intravenous On Call  . furosemide  20 mg Oral Daily  . metoprolol tartrate  12.5 mg Oral BID  . pantoprazole  40 mg Oral Daily  . senna-docusate  2 tablet Oral BID   . sodium chloride flush  3 mL Intravenous Q12H  . warfarin  7.5 mg Oral ONCE-1800  . Warfarin - Pharmacist Dosing Inpatient   Does not apply q1800   Continuous Infusions: . heparin 750 Units/hr (03/19/16 1707)    Principal Problem:   NSTEMI (non-ST elevated myocardial infarction) ([place]) Active Problems:   S/P mitral valve replacement, St Jude   Chronic anticoagulation, ( INR goal 2.0-2.5 due to history of subdural hematoma and liver hemorrhage)   HTN (hypertension)   Atrial fibrillation with slow ventricular response (HCC)   Moderate to severe tricuspid regurgitation  Hyponatremia   AKI (acute kidney injury) ([place])   Anemia due to other cause  Time spent: 39mins  Kassidi Elza, MD, PhD [facility] Pager [phone]  If 7PM-7AM, please contact night-coverage [email or web address] Password [email or web address] 03/21/2016, 12:24 PM    LOS: 10 days

## 2016-03-22 ENCOUNTER — Inpatient Hospital Stay (HOSPITAL_COMMUNITY): Payer: Medicare Other

## 2016-03-22 LAB — BASIC METABOLIC PANEL
ANION GAP: 7 (ref 5–15)
BUN: 20 mg/dL (ref 6–20)
CHLORIDE: 104 mmol/L (ref 101–111)
CO2: 21 mmol/L — AB (ref 22–32)
Calcium: 9.7 mg/dL (ref 8.9–10.3)
Creatinine, Ser: 1.21 mg/dL — ABNORMAL HIGH (ref 0.44–1.00)
GFR calc Af Amer: 50 mL/min — ABNORMAL LOW (ref >60)
GFR calc non Af Amer: 43 mL/min — ABNORMAL LOW (ref >60)
GLUCOSE: 98 mg/dL (ref 65–99)
POTASSIUM: 3.9 mmol/L (ref 3.5–5.1)
Sodium: 132 mmol/L — ABNORMAL LOW (ref 135–145)

## 2016-03-22 LAB — TYPE AND SCREEN
ABO/RH(D): A POS
ANTIBODY SCREEN: NEGATIVE
Unit division: 0

## 2016-03-22 LAB — CBC
HEMATOCRIT: 28.6 % — AB (ref 36.0–46.0)
HEMOGLOBIN: 9.5 g/dL — AB (ref 12.0–15.0)
MCH: 29.7 pg (ref 26.0–34.0)
MCHC: 33.2 g/dL (ref 30.0–36.0)
MCV: 89.4 fL (ref 78.0–100.0)
Platelets: 204 10*3/uL (ref 150–400)
RBC: 3.2 MIL/uL — AB (ref 3.87–5.11)
RDW: 18.9 % — ABNORMAL HIGH (ref 11.5–15.5)
WBC: 6.2 10*3/uL (ref 4.0–10.5)

## 2016-03-22 LAB — PROTIME-INR
INR: 1.97 — AB (ref 0.00–1.49)
Prothrombin Time: 22.3 seconds — ABNORMAL HIGH (ref 11.6–15.2)

## 2016-03-22 LAB — HEPARIN LEVEL (UNFRACTIONATED): Heparin Unfractionated: 0.49 IU/mL (ref 0.30–0.70)

## 2016-03-22 LAB — OCCULT BLOOD X 1 CARD TO LAB, STOOL: Fecal Occult Bld: NEGATIVE

## 2016-03-22 MED ORDER — SENNOSIDES-DOCUSATE SODIUM 8.6-50 MG PO TABS: 1.0000 | ORAL_TABLET | Freq: Every day | ORAL | Status: DC

## 2016-03-22 MED ORDER — WARFARIN SODIUM 5 MG PO TABS
5.0000 mg | ORAL_TABLET | Freq: Once | ORAL | Status: AC
  Administered 2016-03-22: 5 mg via ORAL
  Filled 2016-03-22: qty 1

## 2016-03-22 MED ORDER — ATORVASTATIN CALCIUM 80 MG PO TABS: 80.0000 mg | ORAL_TABLET | Freq: Every day | ORAL | Status: DC

## 2016-03-22 MED ORDER — METOPROLOL TARTRATE 25 MG PO TABS: 12.5000 mg | ORAL_TABLET | Freq: Two times a day (BID) | ORAL | Status: DC

## 2016-03-22 NOTE — Progress Notes (Addendum)
ANTICOAGULATION CONSULT NOTE - Follow Up Consult  Pharmacy Consult:  Heparin / Coumadin Indication:   Afib + St. Jude mechanical MVR  Allergies  Allergen Reactions  . Codeine Nausea And Vomiting    Patient Measurements: Height: 5\' 1"  (154.9 cm) Weight: 143 lb (64.864 kg) IBW/kg (Calculated) : 47.8 Heparin Dosing Weight: 65 kg  Vital Signs: Temp: 98.2 F (36.8 C) (07/02 0500) BP: 107/52 mmHg (07/02 0500) Pulse Rate: 64 (07/02 0500)  Labs:  Recent Labs  03/20/16 0430 03/21/16 0325 03/21/16 0605 03/22/16 0328 03/22/16 0330  HGB 8.3* 8.1*  --  9.5*  --   HCT 25.3* 24.5*  --  28.6*  --   PLT 236 224  --  204  --   LABPROT 18.1*  --  20.6*  --  22.3*  INR 1.50*  --  1.77*  --  1.97*  HEPARINUNFRC 0.51 0.46  --   --  0.49  CREATININE 1.04* 1.11*  --  1.21*  --     Estimated Creatinine Clearance: 35.2 mL/min (by C-G formula based on Cr of 1.21).     Assessment: 31 YOM with history of Afib and St. Jude mechanical MVR on Coumadin PTA with a lower INR goal due to history of SDH and liver hemorrhage.  She was transitioned to IV heparin on admit for cath, and heparin and Coumadin were resumed post cath on 03/16/16 (no CAD noted).  Heparin level today remains therapeutic at 0.49 and INR is still slightly SUBtherapeutic at 1.97 but now starting to trend up with increased dosing.  Slow trend up in INR likely d/t vitamin K given on 6/22. Patient was +FOB on 03/18/16 and no plan for intervention given no overt bleeding per GI.  Home dose: 5 mg daily except 2.5 mg on Sunday   Goal of Therapy:  Heparin level 0.3-0.7 units/ml  INR 2 - 2.5 per Cards Monitor platelets by anticoagulation protocol: Yes    Plan:  - Continue heparin gtt at 750 units/hr - Coumadin 5 mg PO today and if d/c home would resume previous home dose (5 mg daily except 2.5 mg on Sunday) with f/u INR check by Wednesday  - Daily HL / CBC / PT / INR   Steve Gregg K. Velva Harman, PharmD, BCPS, CPP Clinical  Pharmacist Pager: [phone] Phone: [phone] 03/22/2016 8:15 AM

## 2016-03-22 NOTE — Discharge Summary (Signed)
Discharge Summary  Carrie [name] [id number] DOB: 01/22/1941  PCP: Elyn Peers, MD  Admit date: 03/11/2016 Discharge date: 03/23/2016  Time spent: >35mins  Recommendations for Outpatient Follow-up:  1. F/u with PMD within a week  for hospital discharge follow up, repeat cbc/cmp at follow up 2. F/u with cardiology and coumadin clinic,  INR  Goal between 2-2.5 3. F/u with [facility] for anemia  Discharge Diagnoses:  Active Hospital Problems   Diagnosis Date Noted  . NSTEMI (non-ST elevated myocardial infarction) ([place]) 03/11/2016  . Anemia due to other cause   . AKI (acute kidney injury) ([place])   . Hyponatremia 03/12/2016  . 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 Atrial fibrillation with slow ventricular response ([place]) 07/24/2014  . HTN (hypertension) 04/03/2013  . Chronic anticoagulation, ( INR goal 2.0-2.5 due to history of subdural hematoma and liver hemorrhage) 09/09/2011  . S/P mitral valve replacement, St Jude 09/06/2011    Resolved Hospital Problems   Diagnosis Date Noted Date Resolved  No resolved problems to display.    Discharge Condition: stable  Diet recommendation: heart healthy  Filed Weights   03/21/16 0335 03/22/16 0500 03/23/16 0500  Weight: 67.087 kg (147 lb 14.4 oz) 64.864 kg (143 lb) 64.864 kg (143 lb)    History of present illness:  Chief Complaint: Chest pain  HPI: Carrie [name] is a 75 y.o. 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  The patient was in her normal health until tonight at midnight while watching TV when she had onset of left sided chest pressure. The pain was located in the left chest, radiating to the left arm. It was associated with nausea and "feeling very uncomfortable". It was constant and pressure like and unlike any sensation she had had before. Her husband called  9-1-1 and she was transported here.  ED course: -Afebrile, HR 54, BP 152/73, saturating well on 2L [place] -Initial ECG showed irregular rhythm with nonspecific IVCD, similar to previous, and troponin was negative. -Na 132, K 3.4, Cr 0.92 (baseline), WBC 5.9, Hgb 11 -She was given nitro paste and her pain subsided over the course of an hour -TRH was asked to admit for observation, serial troponins and risk stratification.  Hospital Course:  Principal Problem:   NSTEMI (non-ST elevated myocardial infarction) ([place]) Active Problems:   S/P mitral valve replacement, St Jude   Chronic anticoagulation, ( INR goal 2.0-2.5 due to history of subdural hematoma and liver hemorrhage)   HTN (hypertension)   Atrial fibrillation with slow ventricular response ([facility])   Moderate to severe tricuspid regurgitation   Hyponatremia   AKI (acute kidney injury) ([place])   Anemia due to other cause   Chest pain/NSTEMI The patient presented with HEART score of 6.Troponins have been elevated , cath 6/26 no CAD. Pt reports resolution of chest pain.  Continue metoprolol/statin. patient's home coumadin held before cardiac cath, she was started on IV heparin, coumadin restarted with heparin bridging  with  INR goal 2-2.5, before discharge home because of mechanical St. Jude Mitral Valve.   2. HTN:  -metroprolol 12.5 BID and cardizem 120mg  po qd  3. Afib and SSS with pacer:  CHADS2Vasc 5 with chf/htn/age/h/ocva. On warfarin at home, INR goal 2.0-2.5 given hx of SAH and liver hemorrhage. warfarin held for cath.  restarted coumadin with IV heparin bridge post cath,  INR 1.97 on 7/2, she is discharged on coumadin 5mg  daily, close follow up with coumadin clinic for further adjustment of coumadin  - continue home diltiazem and metoprolol, rate controlled.  4. Chronic diastolic CHF:  - clinically appears compensated, lasix held since admission due to acute renal insufficiency.  -restart home oral lasix on 7/1,  additional Iv lasix given on 7/1 after prbc transfusion   5. Anemia:  Mild Thrombocytopenia presented on admission has resolved. Normocytic, hgb trending down, reported feeling tired, anemia work up with FOBT+, gi consulted who recommended outpatient egd/colonoscopy in 6-8 weeks. I have discussed with patient about prbc transfusion x1 unit, she agrees to it. hgb appropriately increased after one prbc transfusion. hgb 9.5 at discharge.   6. Hypokalemia: -Repleted orally  7. Hyponatremia - chronic - unchanged, stable, suspect this is secondary to thiazide and lasix, both held since admission.  -sodium normalized at discharge -lasix resumed at discharge, pmd to continue monitor labs  8. Renal Insufficiency - acute - likely secondary to diuretic use, lasix Held since admission, Resume on 7/1 Patient is on chlorthalidone and lasix at home, chlorthalidone discontinued, lasix resumed at discharge. pmd to repeat labs at hospital follow up.  Code Status: Full  Consults called: Cardiology, GI Disposition: home   Procedures:  Cath 6/26 - see reports  prbc transfusion x1 unit on 7/1   Discharge Exam: BP 117/52 mmHg  Pulse 82  Temp(Src) 98.2 F (36.8 C) (Oral)  Resp 15  Ht 5\' 1"  (1.549 m)  Wt 64.864 kg (143 lb)  BMI 27.03 kg/m2  SpO2 100%   General appearance: Well-developed, elderly adult female, alert and in no acute distress. Skin: Warm and dry.  Cardiac: RRR, nl S1-S2, no murmurs appreciated, mechanical sounding valve. Capillary refill is brisk Respiratory: Normal respiratory rate and rhythm. CTAB without rales or wheezes. Abdomen: Abdomen soft without rigidity. No TTP.No ascites, distension.  MSK: No deformities or effusions. Pain not reproduced with palpation of precordium. No pain with arm movement. Neuro: Sensorium intact and responding to questions, attention normal. Speech is fluent. Moves all extremities equally and with normal coordination.   Psych: Behavior appropriate. Affect normal. No evidence of aural or visual hallucinations or delusions.    Discharge Instructions You were cared for by a hospitalist during your hospital stay. If you have any questions about your discharge medications or the care you received while you were in the hospital after you are discharged, you can call the unit and asked to speak with the hospitalist on call if the hospitalist that took care of you is not available. Once you are discharged, your primary care physician will handle any further medical issues. Please note that NO REFILLS for any discharge medications will be authorized once you are discharged, as it is imperative that you return to your primary care physician (or establish a relationship with a primary care physician if you do not have one) for your aftercare needs so that they can reassess your need for medications and monitor your lab values.      Discharge Instructions    AMB Referral to [facility]    Complete by:  As directed   Please assign to [facility] for transition of care. History of CHF, HTN, AFIB. Written consent signed. Currently at [facility]. Please call with questions. Marthenia Rolling, MSN-Ed, RN,BSN [facility] [id number]  Reason for consult:  Please assign to [facility] RNCM  Diagnoses of:  Heart Failure  Expected date of contact:  1-3 days (reserved for hospital discharges)     Diet - low sodium heart healthy    Complete by:  As directed      Face-to-face encounter (required for Medicare/Medicaid patients)    Complete by:  As directed   I Kimisha Eunice certify that this patient is under my care and that I, or a nurse practitioner or physician's assistant working with me, had a face-to-face encounter that meets the physician face-to-face encounter requirements with this patient on 03/22/2016. The encounter with the patient was in whole, or in part for the following medical condition(s) which is the primary  reason for home health care (List medical condition): FTT RN to check INR and report INR result to coumadin clinic  The encounter with the patient was in whole, or in part, for the following medical condition, which is the primary reason for home health care:  FTT  I certify that, based on my findings, the following services are medically necessary home health services:   Physical therapy Nursing    Reason for Medically Necessary Home Health Services:  Skilled Nursing- Change/Decline in Patient Status  My clinical findings support the need for the above services:  Shortness of breath with activity  Further, I certify that my clinical findings support that this patient is homebound due to:  Shortness of Breath with activity     Home Health    Complete by:  As directed   To provide the following care/treatments:   PT RN       Increase activity slowly    Complete by:  As directed             Medication List    STOP taking these medications        chlorthalidone 25 MG tablet  Commonly known as:  HYGROTON      TAKE these medications        atorvastatin 80 MG tablet  Commonly known as:  LIPITOR  Take 1 tablet (80 mg total) by mouth daily at 6 PM.     Biotin 1000 MCG tablet  Take 1,000 mcg by mouth daily.     CARTIA XT 120 MG 24 hr capsule  Generic drug:  diltiazem  TAKE ONE CAPSULE BY MOUTH DAILY     Fish Oil 1000 MG Caps  Take 1,000 mg by mouth daily.     furosemide 20 MG tablet  Commonly known as:  LASIX  TAKE 1 TABLET BY MOUTH DAILY     metoprolol tartrate 25 MG tablet  Commonly known as:  LOPRESSOR  Take 0.5 tablets (12.5 mg total) by mouth 2 (two) times daily.     MULTIVITAMINS PO  Take 1 tablet by mouth daily.     polyethylene glycol packet  Commonly known as:  MIRALAX / GLYCOLAX  Take 17 g by mouth as needed (for constipation).     potassium chloride SA 20 MEQ tablet  Commonly known as:  K-DUR,KLOR-CON  Take 1 tablet (20 mEq total) by mouth daily.      senna-docusate 8.6-50 MG tablet  Commonly known as:  Senokot-S  Take 1 tablet by mouth at bedtime.     warfarin 5 MG tablet  Commonly known as:  COUMADIN  Take 2.5-5 mg by mouth daily. Take 1/2 tablet on Sunday then take 1 tablet all the other days       Allergies  Allergen Reactions  . Codeine Nausea And Vomiting   Follow-up Information    Follow up with Selinda Eon  Bonnell Public, PA-C On 03/26/2016.   Specialty:  Cardiology   Why:  at 1:15 for hospital follow up, also will see pharmacist at 1:30 Ronnald Collum).    Contact information:   [address] [place] [address] [phone]       Follow up with Harl Bowie, MD On 05/28/2016.   Specialty:  Gastroenterology   Why:  1:45 PM.  for GI follow up of blood detected in stool and anemia.    Contact information:   [address] [place] [phone]       Follow up with Elyn Peers, MD In 1 week.   Specialty:  Family Medicine   Why:  hospital discharge follow up, repeat lab works including cbc/cmp at follow up   Contact information:   [address] [place]  [address] [phone]        The results of significant diagnostics from this hospitalization (including imaging, microbiology, ancillary and laboratory) are listed below for reference.    Significant Diagnostic Studies: Dg Chest 2 View  03/11/2016  CLINICAL DATA:  Acute onset of left-sided chest pain, radiating to the left arm. Initial encounter. EXAM: CHEST  2 VIEW COMPARISON:  Chest radiograph from 07/17/2013 FINDINGS: The lungs are well-aerated. Minimal bibasilar atelectasis or scarring is noted. Peribronchial thickening is seen. There is no evidence of pleural effusion or pneumothorax. The heart is enlarged. The patient is status post median sternotomy. A valve replacement is noted. A pacemaker is noted at the left chest wall, with a single lead ending at the right ventricle. No acute osseous abnormalities are seen. IMPRESSION:  1. Minimal bibasilar atelectasis or scarring noted. Peribronchial thickening seen. Lungs otherwise grossly clear. 2. Cardiomegaly. Electronically Signed   By: Garald Balding M.D.   On: 03/11/2016 02:30   US Abdomen Limited Ruq  03/22/2016  CLINICAL DATA:  Elevated liver function tests.  Atypical chest pain. EXAM: US ABDOMEN LIMITED - RIGHT UPPER QUADRANT COMPARISON:  03/15/2013 and CT on 03/17/2012 FINDINGS: Gallbladder: A tiny 4 mm non mobile gallbladder polyp again noted and stable since prior study. No evidence of gallstones or gallbladder wall thickening. No sonographic Murphy sign noted by sonographer. Common bile duct: Diameter: 4 mm, within normal limits. Liver: Within normal limits in parenchymal echogenicity. At least 2 hypoechoic lesions are seen in the left hepatic lobe measuring 2.2 cm and 1.8 cm, which are indeterminate. A 6 cm cystic lesion is also seen in the right hepatic lobe. Enlargement of hepatic veins is seen. 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 IMPRESSION: Stable tiny gallbladder polyp. No evidence of cholelithiasis or biliary ductal dilatation. Several indeterminate liver lesions, as well as enlargement of the hepatic veins. 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 Abdomen CT without and with contrast recommended for further evaluation. Electronically Signed   By: Earle Gell M.D.   On: 03/22/2016 09:20    Microbiology: No results found for this or any previous visit (from the past 240 hour(s)).   Labs: Basic Metabolic Panel:  Recent Labs Lab 03/19/16 0415 03/20/16 0430 03/21/16 0325 03/22/16 0328 03/23/16 0326  NA 131* 130* 130* 132* 135  K 4.5 4.2 4.3 3.9 3.7  CL 103 103 103 104 96*  CO2 20* 20* 20* 21* 21*  GLUCOSE 87 94 101* 98 95  BUN 23* 18 18 20 18   CREATININE 1.16* 1.04* 1.11*  1.21* 1.10*  CALCIUM 9.2 9.1 9.3 9.7 10.8*  MG  --   --  2.1   --   --    Liver Function Tests:  Recent Labs Lab 03/19/16 0415 03/21/16 0325  AST 100* 106*  ALT 23 28  ALKPHOS 102 116  BILITOT 0.5 0.7  PROT 7.4 6.8  ALBUMIN 3.1* 3.0*   No results for input(s): LIPASE, AMYLASE in the last 168 hours. No results for input(s): AMMONIA in the last 168 hours. CBC:  Recent Labs Lab 03/17/16 0422 03/19/16 0505 03/19/16 1053 03/20/16 0430 03/21/16 0325 03/22/16 0328  WBC 7.0 8.4  --  6.6 6.6 6.2  HGB 8.8* 9.2*  --  8.3* 8.1* 9.5*  HCT 27.3* 27.6* 27.3* 25.3* 24.5* 28.6*  MCV 89.5 90.8  --  91.3 90.7 89.4  PLT 203 246  --  236 224 204   Cardiac Enzymes: No results for input(s): CKTOTAL, CKMB, CKMBINDEX, TROPONINI in the last 168 hours. BNP: BNP (last 3 results) No results for input(s): BNP in the last 8760 hours.  ProBNP (last 3 results) No results for input(s): PROBNP in the last 8760 hours.  CBG: No results for input(s): GLUCAP in the last 168 hours.     SignedFlorencia Reasons MD, PhD  [facility] 03/23/2016, 8:06 AM

## 2016-03-22 NOTE — Progress Notes (Signed)
PROGRESS NOTE    Carrie [name]  [id number]  DOB: 11/22/1940  DOA: 03/11/2016 PCP: Elyn Peers, MD Outpatient Specialists:   Hospital course: Carrie [name] is a 75 y.o. 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  The patient was in her normal health until tonight at midnight while watching TV when she had onset of left sided chest pressure. The pain was located in the left chest, radiating to the left arm. It was associated with nausea and "feeling very uncomfortable". It was constant and pressure like and unlike any sensation she had had before. Her husband called 9-1-1 and she was transported here. Pt for cardiac cath on 6/26.  No CAD found.  Pt has mechanical mitral valve and would need INR >2 before discharge.    Assessment & Plan:   1. Chest pain/NSTEMI The patient presented with HEART score of 6.Troponins have been elevated , cath 6/26 no CAD.  Pt reports resolution of chest pain.  Continue metoprolol/statin.  Patient is  on IV heparin with plans to get INR 2-2.5, before discharge home because of mechanical St. Jude Mitral Valve.    2. HTN:  -metroprolol 12.5 BID and cardizem 120mg  po qd  3. Afib and SSS with pacer:  CHADS2Vasc 5 with chf/htn/age/h/ocva. On warfarin at home, INR goal 2.0-2.5 given hx of SAH and liver hemorrhage. warfarin held for cath.   now On IV heparin and warfarin restarted.  - continue home diltiazem and metoprolol, she does have elevation of HR with ambulation, will monitor  4. Chronic diastolic CHF:  - clinically appears compensated,  lasix held since admission due to acute renal insufficiency.  -restart home oral lasix on 7/1, additional Iv lasix given on 7/1 after prbc transfusion   5. Anemia:  Mild Thrombocytopenia presented on admission has resolved. Normocytic, hgb trending down,  reported feeling tired,  anemia work up with FOBT+, gi consulted who recommended outpatient egd/colonoscopy in 6-8 weeks.  prbc transfusion x1 unit on 7/1   6. Hypokalemia: -Repleted orally  7. Hyponatremia - chronic - unchanged, stable, suspect this is secondary to thiazide and lasix,  both held since admission.   8.  Renal Insufficiency - acute  - likely secondary to diuretic use,  lasix  Held since admission,  Resume on 7/1  Code Status: Full  Consults called: Cardiology, GI Disposition: home , likely 7/3  Procedures:  Cath 6/26 - see reports  Subjective: Pt denies any complaints today.   INR 1.97   Objective: Filed Vitals:   03/21/16 2139 03/22/16 0500 03/22/16 1155 03/22/16 1435  BP:  107/52 124/69 128/62  Pulse: 85 64 56 65  Temp:  98.2 F (36.8 C)  98.1 F (36.7 C)  TempSrc:    Oral  Resp:  17    Height:      Weight:  64.864 kg (143 lb)    SpO2:  98%  96%    Intake/Output Summary (Last 24 hours) at 03/22/16 1832 Last data filed at 03/22/16 0500  Gross per 24 hour  Intake    240 ml  Output      0 ml  Net    240 ml   Filed Weights   03/20/16 0437 03/21/16 0335 03/22/16 0500  Weight: 66.996 kg (147 lb 11.2 oz) 67.087 kg (147 lb 14.4 oz) 64.864 kg (143 lb)  Exam:  General appearance: Well-developed, elderly adult female, alert and in no acute distress. Skin: Warm and dry.  Cardiac: RRR, nl S1-S2, no murmurs appreciated, mechanical sounding valve. Capillary refill is brisk Respiratory: Normal respiratory rate and rhythm. CTAB without rales or wheezes. Abdomen: Abdomen soft without rigidity. No TTP.No ascites, distension.  MSK: No deformities or effusions. Pain not reproduced with palpation of precordium. No pain with arm movement. Neuro: Sensorium intact and responding to questions, attention normal. Speech is fluent. Moves all extremities equally and with normal coordination.  Psych: Behavior appropriate. Affect normal. No evidence of  aural or visual hallucinations or delusions.   Data Reviewed: Basic Metabolic Panel:  Recent Labs Lab 03/18/16 0509 03/19/16 0415 03/20/16 0430 03/21/16 0325 Apr 16, 2016 0328  NA 133* 131* 130* 130* 132*  K 4.5 4.5 4.2 4.3 3.9  CL 103 103 103 103 104  CO2 21* 20* 20* 20* 21*  GLUCOSE 96 87 94 101* 98  BUN 23* 23* 18 18 20   CREATININE 1.13* 1.16* 1.04* 1.11* 1.21*  CALCIUM 9.4 9.2 9.1 9.3 9.7  MG  --   --   --  2.1  --    Liver Function Tests:  Recent Labs Lab 03/19/16 0415 03/21/16 0325  AST 100* 106*  ALT 23 28  ALKPHOS 102 116  BILITOT 0.5 0.7  PROT 7.4 6.8  ALBUMIN 3.1* 3.0*   No results for input(s): LIPASE, AMYLASE in the last 168 hours. No results for input(s): AMMONIA in the last 168 hours. CBC:  Recent Labs Lab 03/17/16 0422 03/19/16 0505 03/19/16 1053 03/20/16 0430 03/21/16 0325 04-16-16 0328  WBC 7.0 8.4  --  6.6 6.6 6.2  HGB 8.8* 9.2*  --  8.3* 8.1* 9.5*  HCT 27.3* 27.6* 27.3* 25.3* 24.5* 28.6*  MCV 89.5 90.8  --  91.3 90.7 89.4  PLT 203 246  --  236 224 204   Cardiac Enzymes: No results for input(s): CKTOTAL, CKMB, CKMBINDEX, TROPONINI in the last 168 hours. BNP (last 3 results) No results for input(s): PROBNP in the last 8760 hours. CBG: No results for input(s): GLUCAP in the last 168 hours.  No results found for this or any previous visit (from the past 240 hour(s)).   Studies: US Abdomen Limited Ruq  2016/04/16  CLINICAL DATA:  Elevated liver function tests.  Atypical chest pain. EXAM: US ABDOMEN LIMITED - RIGHT UPPER QUADRANT COMPARISON:  03/15/2013 and CT on 03/17/2012 FINDINGS: Gallbladder: A tiny 4 mm non mobile gallbladder polyp again noted and stable since prior study. No evidence of gallstones or gallbladder wall thickening. No sonographic Murphy sign noted by sonographer. Common bile duct: Diameter: 4 mm, within normal limits. Liver: Within normal limits in parenchymal echogenicity. At least 2 hypoechoic lesions are seen in the left  hepatic lobe measuring 2.2 cm and 1.8 cm, which are indeterminate. A 6 cm cystic lesion is also seen in the right hepatic lobe. Enlargement of hepatic veins is seen. 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 IMPRESSION: Stable tiny gallbladder polyp. No evidence of cholelithiasis or biliary ductal dilatation. Several indeterminate liver lesions, as well as enlargement of the hepatic veins. 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 Abdomen CT without and with contrast recommended for further evaluation. Electronically Signed   By: Earle Gell M.D.   On: 04-16-2016 09:20     Scheduled Meds: . atorvastatin  80 mg Oral q1800  .  diltiazem  120 mg Oral Daily  . furosemide  20 mg Oral Daily  . metoprolol tartrate  12.5 mg Oral BID  . pantoprazole  40 mg Oral Daily  . senna-docusate  2 tablet Oral BID  . sodium chloride flush  3 mL Intravenous Q12H  . Warfarin - Pharmacist Dosing Inpatient   Does not apply q1800   Continuous Infusions: . heparin 750 Units/hr (03/19/16 1707)    Principal Problem:   NSTEMI (non-ST elevated myocardial infarction) ([place]) Active Problems:   S/P mitral valve replacement, St Jude   Chronic anticoagulation, ( INR goal 2.0-2.5 due to history of subdural hematoma and liver hemorrhage)   HTN (hypertension)   Atrial fibrillation with slow ventricular response ([facility])   Moderate to severe tricuspid regurgitation   Hyponatremia   AKI (acute kidney injury) ([place])   Anemia due to other cause  Time spent: 49mins  Jillianna Stanek, MD, PhD [facility] Pager [phone]  If 7PM-7AM, please contact night-coverage [email or web address] Password [email or web address] 03/22/2016, 6:32 PM    LOS: 11 days

## 2016-03-23 LAB — BASIC METABOLIC PANEL
Anion gap: 18 — ABNORMAL HIGH (ref 5–15)
BUN: 18 mg/dL (ref 6–20)
CHLORIDE: 96 mmol/L — AB (ref 101–111)
CO2: 21 mmol/L — AB (ref 22–32)
Calcium: 10.8 mg/dL — ABNORMAL HIGH (ref 8.9–10.3)
Creatinine, Ser: 1.1 mg/dL — ABNORMAL HIGH (ref 0.44–1.00)
GFR calc Af Amer: 56 mL/min — ABNORMAL LOW (ref >60)
GFR calc non Af Amer: 48 mL/min — ABNORMAL LOW (ref >60)
GLUCOSE: 95 mg/dL (ref 65–99)
Potassium: 3.7 mmol/L (ref 3.5–5.1)
SODIUM: 135 mmol/L (ref 135–145)

## 2016-03-23 LAB — PROTIME-INR
INR: 2.04 — ABNORMAL HIGH (ref 0.00–1.49)
PROTHROMBIN TIME: 22.9 s — AB (ref 11.6–15.2)

## 2016-03-23 LAB — HEPARIN LEVEL (UNFRACTIONATED): HEPARIN UNFRACTIONATED: 0.49 [IU]/mL (ref 0.30–0.70)

## 2016-03-23 MED ORDER — WARFARIN SODIUM 5 MG PO TABS: 5.0000 mg | ORAL_TABLET | Freq: Once | ORAL | Status: DC

## 2016-03-23 NOTE — Progress Notes (Signed)
ANTICOAGULATION CONSULT NOTE - Follow Up Consult  Pharmacy Consult:  Heparin / Coumadin Indication:  Afib + St. Jude mechanical MVR  Allergies  Allergen Reactions  . Codeine Nausea And Vomiting    Patient Measurements: Height: 5\' 1"  (154.9 cm) Weight: 143 lb (64.864 kg) IBW/kg (Calculated) : 47.8 Heparin Dosing Weight: 65 kg  Vital Signs: Temp: 98.2 F (36.8 C) (07/03 0500) BP: 117/52 mmHg (07/03 0500) Pulse Rate: 82 (07/03 0500)  Labs:  Recent Labs  03/21/16 0325 03/21/16 0605 03/22/16 0328 03/22/16 0330 03/23/16 0326  HGB 8.1*  --  9.5*  --   --   HCT 24.5*  --  28.6*  --   --   PLT 224  --  204  --   --   LABPROT  --  20.6*  --  22.3* 22.9*  INR  --  1.77*  --  1.97* 2.04*  HEPARINUNFRC 0.46  --   --  0.49 0.49  CREATININE 1.11*  --  1.21*  --  1.10*    Estimated Creatinine Clearance: 38.7 mL/min (by C-G formula based on Cr of 1.1).     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  She was transitioned to IV heparin on admit for cath, and heparin and Coumadin were resumed post cath on 03/16/16 (no CAD noted).  Heparin level today remains therapeutic and INR is also therapeutic.  Patient was +FOB on 03/18/16 and no plan for intervention given no overt bleeding per GI.   Goal of Therapy:  Heparin level 0.3-0.7 units/ml  INR 2 - 2.5 per Cards Monitor platelets by anticoagulation protocol: Yes    Plan:  - Continue heparin gtt at 750 units/hr.  F/U with order to d/c heparin. - Coumadin 5 mg PO today and if d/c home would resume previous home dose (5 mg daily except 2.5 mg on Sunday) with f/u INR check by Wednesday  - Daily PT / INR    Kanasia Gayman D. Mina Marble, PharmD, BCPS Pager:  [phone] 03/23/2016, 8:01 AM

## 2016-03-23 NOTE — Care Management Important Message (Signed)
Important Message  Patient Details  Name: Carrie [name] MRN: [id number] Date of Birth: 08-13-1941   Medicare Important Message Given:  Yes    Nathen May 03/23/2016, 11:17 AM

## 2016-03-23 NOTE — Care Management Note (Signed)
Case Management Note  Patient Details  Name: Carrie [name] MRN: [id number] Date of Birth: 1941-01-20  Subjective/Objective:          CM following for progression and d/c planning.           Action/Plan: 03/23/2016 Met with pt and husband, noted orders for [facility] and HHPT, pt selected [facility] for HH. Notified Dr Erlinda Hong that [facility] is not available for INR blood draws and calls to coumadin clinic. This pt is ambulatory and has a PCP that has been and can manage her Coumadin. This pt has an appointment at her Coumadin Clinic on 03/25/2016 and will keep this appointment , they will notify her PCP of her result as per outpatient plan . Her PCP will continue to manage her Coumadin ( Dr Clayburn Pert). Pt and husband understand and plan to keep appointments as planned, Dr Dillard Cannon notified.  [place] notified of [place] needs.  Expected Discharge Date:  03/23/2016               Expected Discharge Plan:  [place]  In-House Referral:  NA  Discharge planning Services  CM Consult  Post Acute Care Choice:  Home Health Choice offered to:  NA  DME Arranged:  N/A DME Agency:  NA  HH Arranged:  RN, PT HH Agency:  [place]  Status of Service:  Completed, signed off  If discussed at [place] of Stay Meetings, dates discussed:    Additional Comments:  Adron Bene, RN 03/23/2016, 12:25 PM

## 2016-03-25 ENCOUNTER — Ambulatory Visit (INDEPENDENT_AMBULATORY_CARE_PROVIDER_SITE_OTHER): Payer: Medicare Other | Admitting: Pharmacist

## 2016-03-25 ENCOUNTER — Encounter: Payer: Self-pay | Admitting: *Deleted

## 2016-03-25 ENCOUNTER — Other Ambulatory Visit: Payer: Self-pay | Admitting: *Deleted

## 2016-03-25 DIAGNOSIS — Z954 Presence of other heart-valve replacement: Secondary | ICD-10-CM

## 2016-03-25 DIAGNOSIS — I482 Chronic atrial fibrillation, unspecified: Secondary | ICD-10-CM

## 2016-03-25 DIAGNOSIS — Z7901 Long term (current) use of anticoagulants: Secondary | ICD-10-CM

## 2016-03-25 DIAGNOSIS — Z952 Presence of prosthetic heart valve: Secondary | ICD-10-CM

## 2016-03-25 LAB — POCT INR: INR: 2.7

## 2016-03-25 NOTE — Patient Outreach (Signed)
Member discharged from hospital on 7/3 after being admitted for chest pain.  She was diagnosed with NSTEMI.  According to chart, she also has history of hypertension, atrial fibrillation, acute kidney injury, and mitral valve replacement.  Call placed to member to initiate transition of care program.  This care manager introduced self and purpose of call.  She agrees to involvement with [facility] care management services, denies the need for social worker or pharmacy involvement at this time.  She state that she is "doing pretty good."  She denies any chest pain or discomfort at this time.  She reports that she has an appointment with her PCP next week, has follow up with cardiology later this month.  She denies any signs/symptoms of infection or bleeding from her cath site (wrist).  She verbalizes understanding on recommended diet, stating "yea, I know I'm not supposed to eat greasy/fried foods or a lot of salt.  I don't usually eat that anyway."    Patient was recently discharged from hospital and all medications have been reviewed.  She confirms that she has obtained all new prescriptions from the pharmacy, denies questions.  This care manager inquired about home health nursing/physical therapy, she denies being contacted.  Notified that discharge summary indicates home health was ordered, and she should be receiving a call within the next couple days for a home visit.  She verbalizes understanding.  Member made aware that assigned care manager, L. Zigmund Daniel, will contact her starting next week to continue involvement with program.   Valente David, RN, MSN [place] [phone]

## 2016-03-26 DIAGNOSIS — I110 Hypertensive heart disease with heart failure: Secondary | ICD-10-CM | POA: Diagnosis not present

## 2016-03-26 DIAGNOSIS — I214 Non-ST elevation (NSTEMI) myocardial infarction: Secondary | ICD-10-CM | POA: Diagnosis not present

## 2016-03-26 DIAGNOSIS — D638 Anemia in other chronic diseases classified elsewhere: Secondary | ICD-10-CM | POA: Diagnosis not present

## 2016-03-26 DIAGNOSIS — I4891 Unspecified atrial fibrillation: Secondary | ICD-10-CM | POA: Diagnosis not present

## 2016-03-26 DIAGNOSIS — I081 Rheumatic disorders of both mitral and tricuspid valves: Secondary | ICD-10-CM | POA: Diagnosis not present

## 2016-03-26 DIAGNOSIS — I5032 Chronic diastolic (congestive) heart failure: Secondary | ICD-10-CM | POA: Diagnosis not present

## 2016-03-27 DIAGNOSIS — I5032 Chronic diastolic (congestive) heart failure: Secondary | ICD-10-CM | POA: Diagnosis not present

## 2016-03-27 DIAGNOSIS — I4891 Unspecified atrial fibrillation: Secondary | ICD-10-CM | POA: Diagnosis not present

## 2016-03-27 DIAGNOSIS — I214 Non-ST elevation (NSTEMI) myocardial infarction: Secondary | ICD-10-CM | POA: Diagnosis not present

## 2016-03-27 DIAGNOSIS — I110 Hypertensive heart disease with heart failure: Secondary | ICD-10-CM | POA: Diagnosis not present

## 2016-03-27 DIAGNOSIS — I081 Rheumatic disorders of both mitral and tricuspid valves: Secondary | ICD-10-CM | POA: Diagnosis not present

## 2016-03-27 DIAGNOSIS — D638 Anemia in other chronic diseases classified elsewhere: Secondary | ICD-10-CM | POA: Diagnosis not present

## 2016-03-28 ENCOUNTER — Telehealth: Payer: Self-pay | Admitting: Internal Medicine

## 2016-03-28 NOTE — Telephone Encounter (Signed)
Received page to contact patient about blood in urine over the past 2 hrs.  She feels well, no symptoms of lightheadedness, CP, SOB, palpitations or other concerning bleeding.  Urged her to come to the ED if any symptoms or concerns to be checked (on coumadin at home, INR 2.7 3 days ago).  If bleeding resolves and she feels well, OK to take 1/2 5 mg tablet instead of full 5 mg.  Either way, will need INR checked Monday/Tuesday if she doesn't come in, I urged her to set this up Monday.  If bleeding doesn't stop on own over next hour or two, to come to ED.

## 2016-03-30 ENCOUNTER — Other Ambulatory Visit (HOSPITAL_COMMUNITY)
Admission: RE | Admit: 2016-03-30 | Discharge: 2016-03-30 | Disposition: A | Discharge status: Home / Self Care | Payer: Medicare Other | Source: Ambulatory Visit | Attending: Family Medicine | Admitting: Family Medicine

## 2016-03-30 ENCOUNTER — Other Ambulatory Visit: Payer: Self-pay | Admitting: Family Medicine

## 2016-03-30 ENCOUNTER — Other Ambulatory Visit: Payer: Self-pay | Admitting: *Deleted

## 2016-03-30 DIAGNOSIS — I110 Hypertensive heart disease with heart failure: Secondary | ICD-10-CM | POA: Diagnosis not present

## 2016-03-30 DIAGNOSIS — Z113 Encounter for screening for infections with a predominantly sexual mode of transmission: Secondary | ICD-10-CM | POA: Diagnosis not present

## 2016-03-30 DIAGNOSIS — N390 Urinary tract infection, site not specified: Secondary | ICD-10-CM | POA: Diagnosis not present

## 2016-03-30 DIAGNOSIS — E873 Alkalosis: Secondary | ICD-10-CM | POA: Diagnosis not present

## 2016-03-30 DIAGNOSIS — I10 Essential (primary) hypertension: Secondary | ICD-10-CM | POA: Diagnosis not present

## 2016-03-30 DIAGNOSIS — N760 Acute vaginitis: Secondary | ICD-10-CM | POA: Insufficient documentation

## 2016-03-30 DIAGNOSIS — D508 Other iron deficiency anemias: Secondary | ICD-10-CM | POA: Diagnosis not present

## 2016-03-30 DIAGNOSIS — Z79899 Other long term (current) drug therapy: Secondary | ICD-10-CM | POA: Diagnosis not present

## 2016-03-30 DIAGNOSIS — N3001 Acute cystitis with hematuria: Secondary | ICD-10-CM | POA: Diagnosis not present

## 2016-03-30 DIAGNOSIS — Z7901 Long term (current) use of anticoagulants: Secondary | ICD-10-CM | POA: Diagnosis not present

## 2016-03-30 NOTE — Patient Outreach (Signed)
[place] [facility]) Care Management  03/30/2016  Carrie [name] 01-13-1941 [id number]   RN attempted transition of care contact however unsuccessful. RN able to leave a HIPAA approved voice message requesting a call back. Will continue outreach attempts and schedule another contact.  [name], RN Care Management Coordinator [place] Office [phone]

## 2016-03-31 ENCOUNTER — Other Ambulatory Visit: Payer: Self-pay | Admitting: *Deleted

## 2016-03-31 DIAGNOSIS — I081 Rheumatic disorders of both mitral and tricuspid valves: Secondary | ICD-10-CM | POA: Diagnosis not present

## 2016-03-31 DIAGNOSIS — I110 Hypertensive heart disease with heart failure: Secondary | ICD-10-CM | POA: Diagnosis not present

## 2016-03-31 DIAGNOSIS — I5032 Chronic diastolic (congestive) heart failure: Secondary | ICD-10-CM | POA: Diagnosis not present

## 2016-03-31 DIAGNOSIS — D638 Anemia in other chronic diseases classified elsewhere: Secondary | ICD-10-CM | POA: Diagnosis not present

## 2016-03-31 DIAGNOSIS — I214 Non-ST elevation (NSTEMI) myocardial infarction: Secondary | ICD-10-CM | POA: Diagnosis not present

## 2016-03-31 DIAGNOSIS — I4891 Unspecified atrial fibrillation: Secondary | ICD-10-CM | POA: Diagnosis not present

## 2016-03-31 NOTE — Patient Outreach (Signed)
[place] [facility]) Care Management  03/31/2016  ELMARIE WEHBE Dec 26, 1940 [id number]  RN spoke with pt today and introduced the [facility] and purpose for contacting pt today. Pt very receptive and verbalized an understanding. Pt reports she is weighing daily with her weight today 143 lbs, yesterday at 142 lbs and last week 143 lbs with no swelling or problems breathing.  Pt reports she continues to follow a low sodium and heart healthy diet with no symptoms. Reports upcoming medical appointments 2 weeks with Dr. Criss Rosales and INR in 3-[redacted] weeks along with another appointments on 7/20.  Pt states she is taking all her medications with no delays. RN offered a home visit however pt requested RN to follow up on this Friday. RN requested additional time to complete a telephone assessment as pt agreed. Will also at that time to scheduled initial home visit for enrollment into the [facility] program and services. Will follow up on Friday.   [name], RN Care Management Coordinator [place] Office [phone]

## 2016-04-02 DIAGNOSIS — D638 Anemia in other chronic diseases classified elsewhere: Secondary | ICD-10-CM | POA: Diagnosis not present

## 2016-04-02 DIAGNOSIS — I110 Hypertensive heart disease with heart failure: Secondary | ICD-10-CM | POA: Diagnosis not present

## 2016-04-02 DIAGNOSIS — I214 Non-ST elevation (NSTEMI) myocardial infarction: Secondary | ICD-10-CM | POA: Diagnosis not present

## 2016-04-02 DIAGNOSIS — I4891 Unspecified atrial fibrillation: Secondary | ICD-10-CM | POA: Diagnosis not present

## 2016-04-02 DIAGNOSIS — I081 Rheumatic disorders of both mitral and tricuspid valves: Secondary | ICD-10-CM | POA: Diagnosis not present

## 2016-04-02 DIAGNOSIS — I5032 Chronic diastolic (congestive) heart failure: Secondary | ICD-10-CM | POA: Diagnosis not present

## 2016-04-02 LAB — URINE CYTOLOGY ANCILLARY ONLY
Bacterial vaginitis: NEGATIVE
Candida vaginitis: NEGATIVE
Chlamydia: NEGATIVE
NEISSERIA GONORRHEA: NEGATIVE
Trichomonas: NEGATIVE

## 2016-04-03 ENCOUNTER — Other Ambulatory Visit: Payer: Self-pay | Admitting: *Deleted

## 2016-04-03 NOTE — Patient Outreach (Signed)
[place] [facility]) Care Management  04/03/2016  Carrie [name] 13-Jul-1941 [id number]   RN spoke with pt today and inquired further on her medical issues as discussed some earlier to week. Pt requested RN follow up today and schedule a home visit. RN offered to discussed her medical problems further and to complete some of her initial assessment however pt declined and requested all inquires by presented to her in person on the initial home visit next week. Will complete at that time and verify home address and provide pt with contact number for this RN if needed prior to the initial home visit. Will follow up accordingly.  [name], RN Care Management Coordinator [place] Office [phone]

## 2016-04-07 DIAGNOSIS — I110 Hypertensive heart disease with heart failure: Secondary | ICD-10-CM | POA: Diagnosis not present

## 2016-04-07 DIAGNOSIS — I214 Non-ST elevation (NSTEMI) myocardial infarction: Secondary | ICD-10-CM | POA: Diagnosis not present

## 2016-04-07 DIAGNOSIS — I4891 Unspecified atrial fibrillation: Secondary | ICD-10-CM | POA: Diagnosis not present

## 2016-04-07 DIAGNOSIS — I081 Rheumatic disorders of both mitral and tricuspid valves: Secondary | ICD-10-CM | POA: Diagnosis not present

## 2016-04-07 DIAGNOSIS — D638 Anemia in other chronic diseases classified elsewhere: Secondary | ICD-10-CM | POA: Diagnosis not present

## 2016-04-07 DIAGNOSIS — I5032 Chronic diastolic (congestive) heart failure: Secondary | ICD-10-CM | POA: Diagnosis not present

## 2016-04-09 ENCOUNTER — Encounter: Payer: Self-pay | Admitting: *Deleted

## 2016-04-09 ENCOUNTER — Other Ambulatory Visit: Payer: Self-pay | Admitting: *Deleted

## 2016-04-09 DIAGNOSIS — I5032 Chronic diastolic (congestive) heart failure: Secondary | ICD-10-CM | POA: Diagnosis not present

## 2016-04-09 DIAGNOSIS — I081 Rheumatic disorders of both mitral and tricuspid valves: Secondary | ICD-10-CM | POA: Diagnosis not present

## 2016-04-09 DIAGNOSIS — I214 Non-ST elevation (NSTEMI) myocardial infarction: Secondary | ICD-10-CM | POA: Diagnosis not present

## 2016-04-09 DIAGNOSIS — D638 Anemia in other chronic diseases classified elsewhere: Secondary | ICD-10-CM | POA: Diagnosis not present

## 2016-04-09 DIAGNOSIS — I4891 Unspecified atrial fibrillation: Secondary | ICD-10-CM | POA: Diagnosis not present

## 2016-04-09 DIAGNOSIS — I110 Hypertensive heart disease with heart failure: Secondary | ICD-10-CM | POA: Diagnosis not present

## 2016-04-09 NOTE — Patient Outreach (Signed)
[place] [name]) Care Management   04/09/2016  Carrie [name] Dec 19, 1940 [id number]  Carrie [name] is an 75 y.o. female  Subjective:  HTN: Pt reports she monitoring her BP daily but does not document her readings. Pt aware of her average readings and reports to this RN but not aware of the possible symptoms of HTN crises or elevated BP. Pt feels her BP has improved since her hospitalization but feels she needs more knowledge about her medical conditions.  Pt denies any symptoms today with good BP reported from [facility] via [place].  HF: Pt reports she weighs daily and present all her weights since her recent discharge from the hospital. Pt indicates she was aware of the HF zone but not able to recite any information upon inquiring (RN will educate accordingly). Pt states she has a pacemaker and uses [employer] call system when monitoring her pacemaker in the home. Pt also states her provider is aware and has requested her to weigh in monitoring her HF.  MEDICAL APPOINTMENTS: Pt reports she has attended all her medical appointments as her spouse attempts to schedule his appointments around the same time. Pt reports she has several appointments and currently has sufficient transportation from her spouse.  MEDICATIONS: Pt reports she has all her medications and understandings the purpose of her medications. Pt aware which medication is for her HTN and HF. Pt states she had a UTI and just completed her antibiotics with no additional symptoms.  SWELLING: Pt states she has some swelling to her ankles and has compression stockings but did not have them on at this time. Pt obtain her compression stockings however to small as pt struggles to apply (will provide compression stockings based upon pt's measurements).  HHRN with [facility] involved and was visiting today and indicated pt will have one additional home visit for next week prior to discharge pt from their care.  Objective:   Review of  Systems  Constitutional: Negative.   HENT: Negative.   Eyes: Negative.   Respiratory: Negative.   Cardiovascular: Positive for leg swelling.       Trace edema to bilateral lower extremities.  Gastrointestinal: Negative.   Genitourinary: Negative.   Musculoskeletal: Negative.   Skin: Negative.   Neurological: Negative.   Endo/Heme/Allergies: Negative.   Psychiatric/Behavioral: Negative.     Physical Exam  Constitutional: She is oriented to person, place, and time. She appears well-developed and well-nourished.  HENT:  Right Ear: External ear normal.  Left Ear: External ear normal.  Eyes: EOM are normal.  Neck: Normal range of motion.  Cardiovascular: Normal heart sounds.   Respiratory: Effort normal and breath sounds normal.  GI: Soft. Bowel sounds are normal.  Musculoskeletal: Normal range of motion.  Neurological: She is alert and oriented to person, place, and time.  Skin: Skin is warm and dry.  Psychiatric: She has a normal mood and affect. Her behavior is normal. Judgment and thought content normal.    Encounter Medications:   Outpatient Encounter Prescriptions as of 04/09/2016  Medication Sig  . atorvastatin (LIPITOR) 80 MG tablet Take 1 tablet (80 mg total) by mouth daily at 6 PM.  . Biotin 1000 MCG tablet Take 1,000 mcg by mouth daily.  Marland Kitchen CARTIA XT 120 MG 24 hr capsule TAKE ONE CAPSULE BY MOUTH DAILY  . furosemide (LASIX) 20 MG tablet TAKE 1 TABLET BY MOUTH DAILY  . metoprolol tartrate (LOPRESSOR) 25 MG tablet Take 0.5 tablets (12.5 mg total) by mouth 2 (two)  times daily.  . Multiple Vitamin (MULTIVITAMINS PO) Take 1 tablet by mouth daily.   . Omega-3 Fatty Acids (FISH OIL) 1000 MG CAPS Take 1,000 mg by mouth daily.  . polyethylene glycol (MIRALAX / GLYCOLAX) packet Take 17 g by mouth as needed (for constipation). Reported on 03/25/2016  . potassium chloride SA (K-DUR,KLOR-CON) 20 MEQ tablet Take 1 tablet (20 mEq total) by mouth daily.  Marland Kitchen senna-docusate (SENOKOT-S)  8.6-50 MG tablet Take 1 tablet by mouth at bedtime.  Marland Kitchen warfarin (COUMADIN) 5 MG tablet Take 2.5-5 mg by mouth daily. Take 1/2 tablet on Sunday then take 1 tablet all the other days   No facility-administered encounter medications on file as of 04/09/2016.    Functional Status:   In your present state of health, do you have any difficulty performing the following activities: 04/09/2016 03/11/2016  Hearing? - -  Vision? - -  Difficulty concentrating or making decisions? - -  Walking or climbing stairs? - -  Dressing or bathing? - -  Doing errands, shopping? - N  Conservation officer, nature and eating ? N -  Using the Toilet? N -  In the past six months, have you accidently leaked urine? N -  Do you have problems with loss of bowel control? N -  Managing your Medications? N -  Managing your Finances? N -  Housekeeping or managing your Housekeeping? N -  BP 122/78 mmHg  Pulse 74  Resp 20  Wt 141 lb (63.957 kg)  SpO2 99%  Fall/Depression Screening:    PHQ 2/9 Scores 04/09/2016 01/07/2012 11/26/2011 10/28/2011  PHQ - 2 Score 0 0 0 0    Assessment:   Introduction via [facility] related to community case management Community case management related to HTN Education related HF Follow up on adherence to scheduled medical appointments Follow up on adherence related to prescribed medications Bilateral edema related to lower extremities  Plan:  Will confirm enrollment with signed consent and introduce the [facility] program and available services.  Will discussed the HTN and began education on this topic for case management services. Will provide printed education material for review and present low sodium chart for a salt smart diet for health eating habits. Education material via EMMI presented today: Hight Blood Pressure -What You can do and High Blood Pressure-Health Problems. Discussed pt's history of HTN and what to do if acute symptoms should occur. Will stress the importance of daily monitoring and what to do if  acute symptoms should occur. Will review the FAST related to stroke and pt's knowledge based concerning HTN/STROKE/HEART ATTACK and what to do with acute symptoms. Will discussed and educate HF and what to do if acute symptoms should occur. EMMI printed material education provided with review of the HF-keep track of your weight each day and discussed the importance why this is importance due to fluid retention and when to contact her provider with 3 lbs in one day or 5 lbs in one week with swelling or chest pain due to fluid congestion causing breathing issues (pt verbalized an understanding). Also verified pacemaker checks with [employer] done in the home with no problems or delays. Will continue to encouraged adherence with her daily monitoring of her HF. Will discussed and review upcoming appointments and continue to encouraged adherence with attendance of all appointments. Will also discuss other options for transportation and provide these resources as needed via [facility] calendar resources for [facility].  Will discussed and review all medications and confirm pt's understanding of the  purpose of her medications. Will encourage adherence and use of the prescribed medications and strongly encouraged pt to report any reoccurrence of UTI infection symptoms to her provider for possible intervention (pt recently finished antibiotics for UTI).  Will discussed edema related to her disease process and the importance of managing her swelling to her lower legs/ankles. Will inquired on compression stockings and offer application to reduce her ongoing swelling. Will obtain size and apply compression stockings based upon pt's request due to the pair of stockings pt has are to small based upon pt's swelling. Also provided resource for compression stockings at [employer] in [place], [place] (warehouse that accommodates individuals that need different types of compression stockings at a discounted rate). Plan of  care discussed along with the goals. Will re-evaluate and adjust accordingly on next month's home visit which was scheduled today along with ongoing transition of care contact for next week.  Will send primary provider today's assessment and pt's involvement.  [facility] CM Care Plan Problem One        Most Recent Value   Care Plan Problem One  Risk of hospital readmission related to coronary artery disease as evidenced by recent hospitalization for NSTEMI   Role Documenting the Problem One  Care Management [place] for Problem One  Active   THN Long Term Goal (31-90 days)  Member will not be readmitted to hospital within the next 31 days for heart related condition   THN Long Term Goal Start Date  03/25/16   Interventions for Problem One Long Term Goal  Discussed with member the importance of following discharge instructions, including follow up appointments, medications, diet, and possible home health involvement, to decrease the risk of readmission   THN CM Short Term Goal #1 (0-30 days)  Member will attend follow up appointment within the next 2 weeks   THN CM Short Term Goal #1 Start Date  03/25/16 [Extend (aug) to allow adherence]   Interventions for Short Term Goal #1  Explained to member through discussion the importance of keeping and attending follow up appointment as it decreases risk of readmission.  Appointment confirmed, transportation confirmed   THN CM Short Term Goal #2 (0-30 days)  Member will report taking all medications as prescribed over the next 4 weeks   THN CM Short Term Goal #2 Start Date  03/25/16   Interventions for Short Term Goal #2  Medication list reviewed with member, pharmacy assistance offered but denied.  Discussed importance of taking all medicatiosn as prescribed to decrease risk of readmission      [name], RN Care Management Coordinator [place] Office [phone]

## 2016-04-14 ENCOUNTER — Encounter: Payer: Self-pay | Admitting: Nurse Practitioner

## 2016-04-14 ENCOUNTER — Ambulatory Visit (INDEPENDENT_AMBULATORY_CARE_PROVIDER_SITE_OTHER): Payer: Medicare Other | Admitting: Pharmacist Clinician (PhC)/ Clinical Pharmacy Specialist

## 2016-04-14 ENCOUNTER — Ambulatory Visit (INDEPENDENT_AMBULATORY_CARE_PROVIDER_SITE_OTHER): Payer: Medicare Other | Admitting: Nurse Practitioner

## 2016-04-14 ENCOUNTER — Other Ambulatory Visit: Payer: Self-pay | Admitting: *Deleted

## 2016-04-14 VITALS — BP 145/80 | HR 86 | Ht 61.0 in | Wt 146.1 lb

## 2016-04-14 DIAGNOSIS — D649 Anemia, unspecified: Secondary | ICD-10-CM

## 2016-04-14 DIAGNOSIS — Z954 Presence of other heart-valve replacement: Secondary | ICD-10-CM

## 2016-04-14 DIAGNOSIS — Z7901 Long term (current) use of anticoagulants: Secondary | ICD-10-CM | POA: Diagnosis not present

## 2016-04-14 DIAGNOSIS — I482 Chronic atrial fibrillation, unspecified: Secondary | ICD-10-CM

## 2016-04-14 DIAGNOSIS — I214 Non-ST elevation (NSTEMI) myocardial infarction: Secondary | ICD-10-CM | POA: Diagnosis not present

## 2016-04-14 DIAGNOSIS — I110 Hypertensive heart disease with heart failure: Secondary | ICD-10-CM | POA: Diagnosis not present

## 2016-04-14 DIAGNOSIS — D6489 Other specified anemias: Secondary | ICD-10-CM | POA: Diagnosis not present

## 2016-04-14 DIAGNOSIS — Z952 Presence of prosthetic heart valve: Secondary | ICD-10-CM

## 2016-04-14 DIAGNOSIS — E785 Hyperlipidemia, unspecified: Secondary | ICD-10-CM

## 2016-04-14 LAB — CBC
HEMATOCRIT: 31.9 % — AB (ref 35.0–45.0)
Hemoglobin: 10.4 g/dL — ABNORMAL LOW (ref 11.7–15.5)
MCH: 30.3 pg (ref 27.0–33.0)
MCHC: 32.6 g/dL (ref 32.0–36.0)
MCV: 93 fL (ref 80.0–100.0)
PLATELETS: 221 10*3/uL (ref 140–400)
RBC: 3.43 MIL/uL — AB (ref 3.80–5.10)
RDW: 20.7 % — ABNORMAL HIGH (ref 11.0–15.0)
WBC: 7.7 10*3/uL (ref 3.8–10.8)

## 2016-04-14 LAB — POCT INR: INR: 3

## 2016-04-14 MED ORDER — ATORVASTATIN CALCIUM 80 MG PO TABS: 80.0000 mg | ORAL_TABLET | Freq: Every day | ORAL | 11 refills | Status: DC

## 2016-04-14 NOTE — Patient Instructions (Signed)
Ignacia Bayley, NP, recommends that you continue on your current medications as directed. Please refer to the Current Medication list given to you today.  Your physician recommends that you return for lab work TODAY.  Please keep your follow-up appointment with Dr C on 05/05/2016 at 8:30a.

## 2016-04-14 NOTE — Progress Notes (Signed)
Office Visit    Patient Name: Carrie [name] Date of Encounter: 04/14/2016  Primary Care Provider:  Elyn Peers, MD Primary Cardiologist:  Jerilynn Mages. 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  Past Medical History    Past Medical History:  Diagnosis Date  . Anticoagulated on Coumadin    for mech valve and atrial fib  goal 2.0-2.5  . Anxiety   . Arthritis    "right shoulder" (03/15/2013)  . Atrial fibrillation ([place])   . Atrial flutter ([place])   . CHF (congestive heart failure) ([place])   . Chronic combined systolic and diastolic CHF (congestive heart failure) ([place])    a. 02/2016 Echo: EF 45-50%.  . Diverticula of colon   . Exertional shortness of breath   . GERD (gastroesophageal reflux disease)   . Heart murmur   . Hemorrhage intraabdominal 03/17/2012  . History of blood transfusion    "once" (03/15/2013)  . Hyperlipidemia   . Hypertension   . Liver hemorrhage   . Migraines   . 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 Bi-leaflet St. Jude mechanical prosthesis; b. 02/2016 Echo: EF 45-50%, some degree of MR, sev dil LA/RA, sev TR.  . NSTEMI (non-ST elevated myocardial infarction) ([place])    a. 02/2016 elev trop/Cath: nonobs dzs,   . Pacemaker    medtronic adapta  . Pneumonia 2009   resolved.? OPD Rx  . Severe tricuspid regurgitation    a. 02/2016 Echo: Ef 45-50%, sev TR, PASP 62mmHg.  . Sick sinus syndrome ([place])    Dr Cristopher Peru. EP study negative. Pacemaker 12/15/06 Medtronic  . Stroke [facility])    "they say I had a stroke last year" denies residual on 03/15/2013  . Subdural hematoma [facility])    Past Surgical History:  Procedure Laterality Date  . APPENDECTOMY    . CARDIAC CATHETERIZATION   04/02/97   R&L:severe MR/pulmonary hypertension  . CARDIAC CATHETERIZATION N/A 03/16/2016   Procedure: Left Heart Cath and Coronary Angiography;  Surgeon: [name], MD;  Location: [place] CV LAB;  Service: Cardiovascular;  Laterality: N/A;  . CATARACT EXTRACTION W/ INTRAOCULAR LENS  IMPLANT, BILATERAL Bilateral 2013  . DILATION AND CURETTAGE OF UTERUS     "had fibroids" (03/15/2013)  . EXPLORATORY LAPAROTOMY     "had a growth on my intestines" (03/15/2013)  . HAMMER TOE SURGERY Right   . INSERT / REPLACE / REMOVE PACEMAKER  12/15/2006   Medtronic  . MITRAL VALVE REPLACEMENT  1998   St Jude mechanical; Dr. [name]  . PACEMAKER PLACEMENT  12/15/06   medtronic adapta for SSS  . PERMANENT PACEMAKER GENERATOR CHANGE N/A 07/24/2014   Procedure: PERMANENT PACEMAKER GENERATOR CHANGE;  Surgeon: [name], MD;  Location: [place] CATH LAB;  Service: Cardiovascular;  Laterality: N/A;  . PERSANTINE CARDIOLITE  08/07/03   mild inf. ischemia   . TEE WITHOUT CARDIOVERSION  09/23/2011   Procedure: TRANSESOPHAGEAL ECHOCARDIOGRAM (TEE);  Surgeon: [name];  Location: [facility] ENDOSCOPY;  Service: Cardiovascular;  Laterality: N/A;  . TONSILLECTOMY    . TUBAL LIGATION    . US ECHOCARDIOGRAPHY  11/19/2011   EF 50-55%,RA mod to severely dilated,LA severely dilated,trace MR,small vegetation or mass on the MV,AOV mildly scleroticmild PI, RV pressure 40-73mmHg  Allergies  Allergies  Allergen Reactions  . Codeine Nausea And Vomiting    History of Present Illness    75 year old female with the above Past medical history. She has a history of rheumatic mitral valve insufficiency status post St. Jude mechanical mitral valve replacement in 1998. She also has a history of sinus node dysfunction status post permanent pacemaker placement 2008. She has permanent atrial fibrillation, which is well rate controlled. In the setting of A. fib and mechanical valve, she is on chronic Coumadin anticoagulation.  Over the past several months, she has been having a decline in exercise tolerance and was admitted to [facility] in late June secondary to chest pain and elevated troponin. She underwent diagnostic catheterization revealing nonobstructive CAD. Echo showed an EF of 45-50% with some degree of mitral regurgitation, biatrial enlargement, and severe tricuspid regurgitation. She was anemic with hemoglobins in the 9 range, and was seen by GI with outpatient follow-up arranged. Since discharge, she has done reasonable well. She does still have some degree of dyspnea on exertion, though only occurring at higher levels of activity. She has not been particularly active since leaving the hospital. Her weight has been stable at 142 pounds on her scale at home. She has not been having any chest pain, PND, orthopnea, dizziness, syncope, edema, or early satiety.  Home Medications    Prior to Admission medications   Medication Sig Start Date End Date Taking? Authorizing Provider  atorvastatin (LIPITOR) 80 MG tablet Take 1 tablet (80 mg total) by mouth daily at 6 PM. 03/22/16  Yes Florencia Reasons, MD  Biotin 1000 MCG tablet Take 1,000 mcg by mouth daily.   Yes Historical Provider, MD  CARTIA XT 120 MG 24 hr capsule TAKE ONE CAPSULE BY MOUTH DAILY 02/24/16  Yes Mihai Croitoru, MD  furosemide (LASIX) 20 MG tablet TAKE 1 TABLET BY MOUTH DAILY 11/26/15  Yes Mihai Croitoru, MD  metoprolol tartrate (LOPRESSOR) 25 MG tablet Take 0.5 tablets (12.5 mg total) by mouth 2 (two) times daily. 03/22/16  Yes Florencia Reasons, MD  Multiple Vitamin (MULTIVITAMINS PO) Take 1 tablet by mouth daily.    Yes Historical Provider, MD  Omega-3 Fatty Acids (FISH OIL) 1000 MG CAPS Take 1,000 mg by mouth daily.   Yes Historical Provider, MD  polyethylene glycol (MIRALAX / GLYCOLAX) packet Take 17 g by mouth as needed (for constipation). Reported on 03/25/2016   Yes Historical Provider, MD  potassium chloride SA (K-DUR,KLOR-CON) 20 MEQ tablet Take 1 tablet (20 mEq total) by  mouth daily. 11/15/15  Yes Mihai Croitoru, MD  senna-docusate (SENOKOT-S) 8.6-50 MG tablet Take 1 tablet by mouth at bedtime. 03/22/16  Yes Florencia Reasons, MD  warfarin (COUMADIN) 5 MG tablet Take 2.5-5 mg by mouth daily. Take 1/2 tablet on Sunday then take 1 tablet all the other days   Yes Historical Provider, MD    Review of Systems    She continues to have some degree of dyspnea on exertion at higher levels of activity. She denies PND, orthopnea, chest pain, palpitations, dizziness, syncope, edema, or early satiety.  All other systems reviewed and are otherwise negative except as noted above.  Physical Exam    VS:  BP (!) 145/80   Pulse 86   Ht 5\' 1"  (1.549 m)   Wt 146 lb 1 oz (66.3 kg)   BMI 27.60 kg/m  , BMI Body mass index is 27.6 kg/m. GEN: Well nourished, well developed, in no acute distress.  HEENT: normal.  Neck: Supple,  no JVD, carotid bruits, or masses. Cardiac: Irregularly irregular, Mechanical S1, 3/6 systolic murmur loudest at the left lower sternal border but heard throughout, no rubs, or gallops. No clubbing, cyanosis, edema.  Radials/DP/PT 2+ and equal bilaterally.  Respiratory:  Respirations regular and unlabored, clear to auscultation bilaterally. GI: Soft, nontender, nondistended, BS + x 4. MS: no deformity or atrophy. Skin: warm and dry, no rash. Neuro:  Strength and sensation are intact. Psych: Normal affect.  Accessory Clinical Findings    ECG - Ventricular paced with underlying atrial fibrillation, 86  Assessment & Plan    1.  Non-STEMI, subsequent episode of care/nonobstructive CAD: Patient was recently admitted with chest pain and troponin elevation to 5.32. Catheterization revealed nonobstructive CAD without culprit for non-STEMI. EF was 45% by echo. She has not had any recurrence of chest pain. She remains on beta blocker and high potency statin therapy. She is not on aspirin in the setting of chronic Coumadin anticoagulation.  2. Chronic combined systolic and  diastolic congestive heart failure: EF 45-50%. Weight has been stable at 142 pounds at home. Blood pressures been running in the mid 130s to 140 at home. Heart rate is stable. She has no evidence of volume overload on exam today. Continue current regimen including low-dose Lasix at 20 mg daily.  3. Persistent atrial fibrillation: Recently well rate controlled on beta blocker and calcium channel blocker therapy. She is adequately with Coumadin and INR is 3.0 today.  4. Status post mechanical mitral valve replacement: Stable by recent echo. INR therapeutic.  5.  Normocytic anemia: Hemoglobin was 9.5 with hematocrit of 20.6 on July 2. She continues to have some degree of dyspnea on exertion. In light of stable volume and no significant coronary disease, question role of anemia versus deconditioning and ongoing dyspnea. Follow-up CBC today. She has follow-up with GI in early September.  6. Hypertensive heart disease: Blood pressures been relatively stable at home on beta blocker, calcium channel blocker, and Lasix.  7. Hyperlipidemia: Recent LDL was 88 in the setting of non-STEMI. Continue high potency statin therapy.  8. Disposition: She has follow-up with Dr. Sallyanne Kuster on August 15.   Murray Hodgkins, NP 04/14/2016, 9:12 AM

## 2016-04-15 DIAGNOSIS — I480 Paroxysmal atrial fibrillation: Secondary | ICD-10-CM | POA: Diagnosis not present

## 2016-04-15 DIAGNOSIS — I10 Essential (primary) hypertension: Secondary | ICD-10-CM | POA: Diagnosis not present

## 2016-04-15 DIAGNOSIS — I238 Other current complications following acute myocardial infarction: Secondary | ICD-10-CM | POA: Diagnosis not present

## 2016-04-16 ENCOUNTER — Other Ambulatory Visit: Payer: Self-pay | Admitting: *Deleted

## 2016-04-16 DIAGNOSIS — I4891 Unspecified atrial fibrillation: Secondary | ICD-10-CM | POA: Diagnosis not present

## 2016-04-16 DIAGNOSIS — D638 Anemia in other chronic diseases classified elsewhere: Secondary | ICD-10-CM | POA: Diagnosis not present

## 2016-04-16 DIAGNOSIS — I081 Rheumatic disorders of both mitral and tricuspid valves: Secondary | ICD-10-CM | POA: Diagnosis not present

## 2016-04-16 DIAGNOSIS — I5032 Chronic diastolic (congestive) heart failure: Secondary | ICD-10-CM | POA: Diagnosis not present

## 2016-04-16 DIAGNOSIS — I110 Hypertensive heart disease with heart failure: Secondary | ICD-10-CM | POA: Diagnosis not present

## 2016-04-16 DIAGNOSIS — I214 Non-ST elevation (NSTEMI) myocardial infarction: Secondary | ICD-10-CM | POA: Diagnosis not present

## 2016-04-16 NOTE — Patient Outreach (Signed)
[facility]) Care Management  04/16/2016  Carrie [name] 1940/12/26 [id number]    RN spoke with pt today and inquired on her management of care. Pt reports her BP was a little elevated on her last doctor's visit (144/70). No medication changes or interventions mentioned. Pt states she will continue to take her BP with her home device and aware of when to contact her provider. Pt also reports her weights concerning her HF with today at 142 lbs, yesterday 140 lbs and last week 144 lbs with no additional swelling or symptoms. States she is wearing the compression stockings provider by this RN on the initial home visit and they are working well in reducing her swelling. No other inquires or request at this time as pt was encouraged to continue managing her medical issues with daily monitoring. Pt receptive to a call next week for ongoing transition of care and reminded of the scheduled home visit for next month. Will follow up accordingly.  [name], RN Care Management Coordinator [place] Office [phone]

## 2016-04-24 ENCOUNTER — Other Ambulatory Visit: Payer: Self-pay | Admitting: *Deleted

## 2016-04-24 NOTE — Patient Outreach (Signed)
[facility]) Care Management  04/24/2016  Carrie [name] 1941-07-03 [id number]   RN spoke with pt today who reports she continues to recover with no major issues. Consent spouse states pt is progressing however slow to "get back her old self". Pt states she is doing "fine" with no major problems and taking all her medications as prescribed. In additional to attending all medical appointments with no problems. Pt reports her daily weights with today at 140 lbs, yesterday at 140 lbs and last week at 141 lbs with no problems with swelling as she continues to wear the compression stocking provided by this RN on the last home visit. Pt denies any signs or symptoms of UTI as she continues to urinate with no reports of burning or pain upon urination. Reports regular BM with no delays and pt continues to take her medications as recommended.  Pt aware that this is the last transition of care call however home visits will continue and the next scheduled date verified with pt today.   [name], RN Care Management Coordinator [place] Office [phone]

## 2016-05-05 ENCOUNTER — Ambulatory Visit (INDEPENDENT_AMBULATORY_CARE_PROVIDER_SITE_OTHER): Payer: Medicare Other | Admitting: Cardiovascular Disease

## 2016-05-05 ENCOUNTER — Encounter: Payer: Self-pay | Admitting: Cardiovascular Disease

## 2016-05-05 ENCOUNTER — Ambulatory Visit (INDEPENDENT_AMBULATORY_CARE_PROVIDER_SITE_OTHER): Payer: Medicare Other | Admitting: Pharmacist

## 2016-05-05 VITALS — BP 128/68 | HR 78 | Ht 61.0 in | Wt 142.2 lb

## 2016-05-05 DIAGNOSIS — Z7901 Long term (current) use of anticoagulants: Secondary | ICD-10-CM

## 2016-05-05 DIAGNOSIS — I482 Chronic atrial fibrillation, unspecified: Secondary | ICD-10-CM

## 2016-05-05 DIAGNOSIS — Z954 Presence of other heart-valve replacement: Secondary | ICD-10-CM

## 2016-05-05 DIAGNOSIS — Z952 Presence of prosthetic heart valve: Secondary | ICD-10-CM

## 2016-05-05 DIAGNOSIS — I214 Non-ST elevation (NSTEMI) myocardial infarction: Secondary | ICD-10-CM

## 2016-05-05 DIAGNOSIS — I4891 Unspecified atrial fibrillation: Secondary | ICD-10-CM

## 2016-05-05 DIAGNOSIS — R2681 Unsteadiness on feet: Secondary | ICD-10-CM

## 2016-05-05 DIAGNOSIS — I2721 Secondary pulmonary arterial hypertension: Secondary | ICD-10-CM

## 2016-05-05 DIAGNOSIS — I5042 Chronic combined systolic (congestive) and diastolic (congestive) heart failure: Secondary | ICD-10-CM

## 2016-05-05 DIAGNOSIS — I10 Essential (primary) hypertension: Secondary | ICD-10-CM

## 2016-05-05 DIAGNOSIS — Z950 Presence of cardiac pacemaker: Secondary | ICD-10-CM

## 2016-05-05 DIAGNOSIS — I272 Other secondary pulmonary hypertension: Secondary | ICD-10-CM

## 2016-05-05 DIAGNOSIS — I071 Rheumatic tricuspid insufficiency: Secondary | ICD-10-CM

## 2016-05-05 LAB — CUP PACEART INCLINIC DEVICE CHECK
Battery Impedance: 333 Ohm
Date Time Interrogation Session: 20170815100115
Implantable Lead Implant Date: 20080326
Implantable Lead Model: 4076
Lead Channel Impedance Value: 442 Ohm
Lead Channel Pacing Threshold Pulse Width: 0.4 ms
Lead Channel Sensing Intrinsic Amplitude: 5.6 mV
Lead Channel Setting Pacing Pulse Width: 0.4 ms
Lead Channel Setting Sensing Sensitivity: 2.8 mV
MDC IDC LEAD LOCATION: 753860
MDC IDC MSMT BATTERY REMAINING LONGEVITY: 99 mo
MDC IDC MSMT BATTERY VOLTAGE: 2.78 V
MDC IDC MSMT LEADCHNL RA IMPEDANCE VALUE: 0 Ohm
MDC IDC MSMT LEADCHNL RV PACING THRESHOLD AMPLITUDE: 1 V
MDC IDC SET LEADCHNL RV PACING AMPLITUDE: 2.5 V
MDC IDC STAT BRADY RV PERCENT PACED: 43 %

## 2016-05-05 LAB — POCT INR: INR: 2.6

## 2016-05-05 NOTE — Progress Notes (Signed)
Patient ID: Carrie [name], female   DOB: June 14, 1941, 75 y.o.   MRN: [id number] Patient ID: Carrie [name], female   DOB: [birth year], 75 y.o.   MRN: [id number]    Cardiology Office Note    Date:  05/05/2016   ID:  Carrie [name], DOB September 23, 1940, MRN [id number]  PCP:  Elyn Peers, MD  Cardiologist:   [name], MD   Chief Complaint  Patient presents with  . Follow-up    pacer check, no chest pain, occassionally edema, no other complaints    History of Present Illness:  Carrie [name] is a 75 y.o. 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  In December 2012 she had subarachnoid hemorrhage and in June 2013 she had intrahepatic hemorrhage requiring arterial embolization and we are keeping her INR in the lower range (2.0-2.5). She has normal coronary arteries by previous angiography (2004) normal left ventricular systolic function by echocardiography (Oct 2015).  03/11/2016 shows hospitalized with angina pectoris and was found to have a small NSTEMI with a peak troponin of around 5. Her INR was borderline at 1.98 on admission and she was mildly anemic with a hemoglobin of approximately 9. Coronary angiography was performed and showed minor nonobstructive disease. Left ventricular ejection fraction was around 45-50% and no clear culprit for infarction was identified, although the echo described inferolateral hypokinesis. Admitted PA pressure was moderately elevated at 58 mmHg. There was severe tricuspid insufficiency. At catheterization, normal left ventricular end-diastolic pressure of 13 mmHg was recorded.  She has  not had any problems with angina and denies exertional dyspnea or leg edema. She had a fall walking to the mailbox. She did not feel dizzy or lose consciousness but seemed to trip over her own feet. Her gait has appeared to be quite unsteady according to her husband. She injured her left shoulder but did not hit her head. She has not had any subsequent neurological complaints. She denies any serious bleeding issues.  Interrogation of her Medtronic Sensia single chamber pacemaker shows normal device function with an estimated generator longevity of 6.5-9 years, good histograms in the absence of episodes of high ventricular rate. She has a lower percentage of ventricular pacing of 43% (at lower rate limit of 50 bpm) .  Past Medical History:  Diagnosis Date  . Anticoagulated on Coumadin    for mech valve and atrial fib  goal 2.0-2.5  . Anxiety   . Arthritis    "right shoulder" (03/15/2013)  . Atrial fibrillation ([place])   . Atrial flutter ([place])   . CHF (congestive heart failure) ([place])   . Chronic combined systolic and diastolic CHF (congestive heart failure) ([place])    a. 02/2016 Echo: EF 45-50%.  . Diverticula of colon   . Exertional shortness of breath   . GERD (gastroesophageal reflux disease)   . Heart murmur   . Hemorrhage intraabdominal 03/17/2012  . History of blood transfusion    "once" (03/15/2013)  . Hyperlipidemia   . Hypertension   . Liver hemorrhage   . Migraines   . 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 Bi-leaflet St. Jude mechanical prosthesis; b. 02/2016 Echo: EF 45-50%, some degree of MR, sev dil LA/RA, sev TR.  Marland Kitchen  NSTEMI (non-ST elevated myocardial infarction) ([place])    a. 02/2016 elev trop/Cath: nonobs dzs,   . Pacemaker    medtronic adapta  . Pneumonia 2009   resolved.? OPD Rx  . Severe tricuspid regurgitation    a. 02/2016 Echo: Ef 45-50%, sev TR, PASP 36mmHg.  . Sick sinus syndrome ([place])    Dr Cristopher Peru. EP study negative. Pacemaker 12/15/06 Medtronic  .  Stroke [facility])    "they say I had a stroke last year" denies residual on 03/15/2013  . Subdural hematoma [facility])     Past Surgical History:  Procedure Laterality Date  . APPENDECTOMY    . CARDIAC CATHETERIZATION  04/02/97   R&L:severe MR/pulmonary hypertension  . CARDIAC CATHETERIZATION N/A 03/16/2016   Procedure: Left Heart Cath and Coronary Angiography;  Surgeon: [name], MD;  Location: [place] CV LAB;  Service: Cardiovascular;  Laterality: N/A;  . CATARACT EXTRACTION W/ INTRAOCULAR LENS  IMPLANT, BILATERAL Bilateral 2013  . DILATION AND CURETTAGE OF UTERUS     "had fibroids" (03/15/2013)  . EXPLORATORY LAPAROTOMY     "had a growth on my intestines" (03/15/2013)  . HAMMER TOE SURGERY Right   . INSERT / REPLACE / REMOVE PACEMAKER  12/15/2006   Medtronic  . MITRAL VALVE REPLACEMENT  1998   St Jude mechanical; Dr. [name]  . PACEMAKER PLACEMENT  12/15/06   medtronic adapta for SSS  . PERMANENT PACEMAKER GENERATOR CHANGE N/A 07/24/2014   Procedure: PERMANENT PACEMAKER GENERATOR CHANGE;  Surgeon: [name], MD;  Location: [facility] CATH LAB;  Service: Cardiovascular;  Laterality: N/A;  . PERSANTINE CARDIOLITE  08/07/03   mild inf. ischemia   . TEE WITHOUT CARDIOVERSION  09/23/2011   Procedure: TRANSESOPHAGEAL ECHOCARDIOGRAM (TEE);  Surgeon: [name];  Location: [facility];  Service: Cardiovascular;  Laterality: N/A;  . TONSILLECTOMY    . TUBAL LIGATION    . US ECHOCARDIOGRAPHY  11/19/2011   EF 50-55%,RA mod to severely dilated,LA severely dilated,trace MR,small vegetation or mass on the MV,AOV mildly scleroticmild PI, RV pressure 40-60mmHg    Outpatient Medications Prior to Visit  Medication Sig Dispense Refill  . Biotin 1000 MCG tablet Take 1,000 mcg by mouth daily.    Marland Kitchen CARTIA XT 120 MG 24 hr capsule TAKE ONE CAPSULE BY MOUTH DAILY 90 capsule 3  . furosemide (LASIX) 20 MG tablet TAKE 1 TABLET BY MOUTH DAILY 90 tablet 0  . metoprolol tartrate (LOPRESSOR) 25 MG tablet  Take 0.5 tablets (12.5 mg total) by mouth 2 (two) times daily. 180 tablet 3  . Multiple Vitamin (MULTIVITAMINS PO) Take 1 tablet by mouth daily.     . Omega-3 Fatty Acids (FISH OIL) 1000 MG CAPS Take 1,000 mg by mouth daily.    . polyethylene glycol (MIRALAX / GLYCOLAX) packet Take 17 g by mouth as needed (for constipation). Reported on 03/25/2016    . potassium chloride SA (K-DUR,KLOR-CON) 20 MEQ tablet Take 1 tablet (20 mEq total) by mouth daily. 30 tablet 5  . senna-docusate (SENOKOT-S) 8.6-50 MG tablet Take 1 tablet by mouth at bedtime. 30 tablet 0  . warfarin (COUMADIN) 5 MG tablet Take 2.5-5 mg by mouth daily. Take 1/2 tablet on Sunday then take 1 tablet all the other days    . atorvastatin (LIPITOR) 80 MG tablet Take 1 tablet (80 mg total) by mouth daily at 6 PM. 30 tablet 11   No facility-administered medications prior to visit.      Allergies:   Codeine   Social History  Social History  . Marital status: Married    Spouse name: N/A  . Number of children: N/A  . Years of education: N/A   Social History Main Topics  . Smoking status: Never Smoker  . Smokeless tobacco: Never Used  . Alcohol use No  . Drug use: No  . Sexual activity: No   Other Topics Concern  . None   Social History Narrative  . None     Family History:  The patient's family history includes Cancer in her mother and sister; Diabetes in her sister; Healthy in her brother, brother, brother, and sister; Heart attack in her maternal grandfather; Kidney disease in her daughter; Leukemia in her sister; Stroke in her brother and father.   ROS:   Please see the history of present illness.    ROS All other systems reviewed and are negative.   PHYSICAL EXAM:   VS:  BP 128/68 (BP Location: Left Arm, Patient Position: Sitting, Cuff Size: Normal)   Pulse 78   Ht 5\' 1"  (1.549 m)   Wt 142 lb 4 oz (64.5 kg)   BMI 26.88 kg/m    GEN: Well nourished, well developed, in no acute distress  HEENT: normal  Neck:  8-9 cm elevated JVP, very prominent V waves, no carotid bruits or masses Cardiac: Irregular, crisp prosthetic valve clicks;  no murmurs, rubs, or gallops, 1+ ankle and pretibial edema, wearingcompression stockings  Respiratory:  clear to auscultation bilaterally, normal work of breathing GI: soft, nontender, nondistended, + BS MS: no deformity or atrophy  Skin: warm and dry, no rash Neuro:  Alert and Oriented x 3, Strength and sensation are intact Psych: euthymic mood, full affect  Wt Readings from Last 3 Encounters:  05/05/16 142 lb 4 oz (64.5 kg)  04/14/16 146 lb 1 oz (66.3 kg)  04/09/16 141 lb (64 kg)      Studies/Labs Reviewed:   EKG:  EKG is not ordered today.    Recent Labs: 03/21/2016: ALT 28; Magnesium 2.1 03/23/2016: BUN 18; Creatinine, Ser 1.10; Potassium 3.7; Sodium 135 04/14/2016: Hemoglobin 10.4; Platelets 221    ASSESSMENT:    1. NSTEMI (non-ST elevated myocardial infarction) ([place])   2. Chronic combined systolic (congestive) and diastolic (congestive) heart failure ([place])   3. PAH (pulmonary artery hypertension) ([place])   4. Atrial fibrillation with slow ventricular response ([facility])   5. Chronic anticoagulation, ( INR goal 2.0-2.5 due to history of subdural hematoma and liver hemorrhage)   6. Pacemaker - single chamber Medtronic Adapta, 2008   7. S/P mitral valve replacement, St Jude   8. Moderate to severe tricuspid regurgitation   9. Essential hypertension   10. Unsteady gait      PLAN:  In order of problems listed above:  1. Minor CAD with recent NSTEMI: I think it is unlikely that this was related to demand ischemia since it began at rest and was very abrupt. There is no evidence of significant coronary atherosclerosis to explain it and no culprit coronary lesion was identified. I think it is highly likely that she had a small embolic myocardial infarction related to her prosthetic mitral valve and atrial fibrillation. Her INR was borderline low. As such, I don't  think there is a lot of reason to continue high-dose statin therapy, especially since her baseline LDL cholesterol is quite low at 88 mg/deciliter. 2. CHF: Seems euvolemic, NYHA functional class II. High-frequency ventricular pacing probably contributed to the previous problems heart failure. No episodes of high ventricular rate  have been seen since her previous device check. Continue the current diuretic dose. Note the fact that she has moderate pulmonary artery hypertension with normal left ventricular end-diastolic pressure. 3. PAH: Normal LVEDP, normally functioning mitral valve prosthesis. She may have some degree of fixed pulmonary hypertension related to previous mitral valve disease. No clear symptoms of obstructive sleep apnea, no history of pulmonary embolism or COPD 4. AFib: I think we have a reasonable balance between avoiding high ventricular rates and avoiding excessive ventricular pacing on the current dose of rate control medication 5. Anticoagulation with previous severe bleeding complications, but also with possible recent thromboembolic event, keeping INR very tightly in the 2.0-2.5 range 6. PPM: Normal device function, deep at the current device settings. Check every 3 months via either office visit or remote downloads. 7. Mechanical mitral valve replacement: She has a large diameter valve that had normal function by a relatively recent echocardiogram. 8. Moderate to severe TR is probably contributing to edema and the jugular venous distention. She may have some degree of rheumatic valve disease and it appears she has moderate pulmonary artery hypertension her most recent echo. The presence of a transvalvular pacemaker lead may also be contributory. Physical exam is compatible with severe tricuspid regurgitation. I don't think should be a great candidate for repeat surgery at age 89, but this opportunity might need to be taken if there is evidence of serious hemodynamic complications or  liver abnormalities. 9. HTN is well controlled 10. Poor balance/gait and recent fall. Falls could be associated with disastrous complications in this patient on chronic anticoagulation. Recommend referral for physical therapy.    Medication Adjustments/Labs and Tests Ordered: Current medicines are reviewed at length with the patient today.  Concerns regarding medicines are outlined above.  Medication changes, Labs and Tests ordered today are listed in the Patient Instructions below. Patient Instructions  Dr Sallyanne Kuster has recommended making the following medication changes: 1. STOP Atorvastatin  You have been referred to physical therapy for balance and unsteady gait.  Dr Sallyanne Kuster recommends that you schedule a follow-up appointment in 3 months with a pacemaker check.  If you need a refill on your cardiac medications before your next appointment, please call your pharmacy.      Signed, [name], MD  05/05/2016 10:55 AM    [facility] [address], [place], [place]  [address] Phone: [phone]; Fax: [phone]

## 2016-05-05 NOTE — Patient Instructions (Signed)
Dr Sallyanne Kuster has recommended making the following medication changes: 1. STOP Atorvastatin  You have been referred to physical therapy for balance and unsteady gait.  Dr Sallyanne Kuster recommends that you schedule a follow-up appointment in 3 months with a pacemaker check.  If you need a refill on your cardiac medications before your next appointment, please call your pharmacy.

## 2016-05-08 ENCOUNTER — Other Ambulatory Visit: Payer: Self-pay | Admitting: Cardiovascular Disease

## 2016-05-08 ENCOUNTER — Encounter: Payer: Self-pay | Admitting: Cardiovascular Disease

## 2016-05-08 NOTE — Telephone Encounter (Signed)
Rx request sent to pharmacy.  

## 2016-05-15 DIAGNOSIS — I480 Paroxysmal atrial fibrillation: Secondary | ICD-10-CM | POA: Diagnosis not present

## 2016-05-15 DIAGNOSIS — I10 Essential (primary) hypertension: Secondary | ICD-10-CM | POA: Diagnosis not present

## 2016-05-20 ENCOUNTER — Other Ambulatory Visit: Payer: Self-pay | Admitting: *Deleted

## 2016-05-20 NOTE — Patient Outreach (Signed)
[facility]) Care Management   05/20/2016  Carrie [name] 01/12/1941 [id number]  Carrie [name] is an 75 y.o. female  Subjective:  HF: Pt is managing her HF with daily weights and will continue her attempts with a salt smart diet. Denies any SOB however continues to have some swelling to her lower legs and ankles. Pt has compression stockings supplied by this RN on the last home visit however states they are to difficult to apply and she has not requested assistance form her spouse to apply the stockings. Husbands states he will assist from now on with application of the compression stockings.  SWELLING: Due to the ongoing swelling spouse has requested 6 pairs of the compression stockings for pt for daily application. HTN: APPOINTMENTS:    Objective:   Review of Systems  Constitutional: Negative.   HENT: Negative.   Eyes: Negative.   Respiratory: Positive for cough.   Cardiovascular: Positive for leg swelling.       History of bilateral swelling (HF)  Gastrointestinal: Negative.   Genitourinary: Negative.   Musculoskeletal: Negative.   Skin: Negative.   Neurological: Negative.   Endo/Heme/Allergies: Negative.   Psychiatric/Behavioral: Negative.     Physical Exam  Constitutional: She is oriented to person, place, and time. She appears well-developed and well-nourished.  HENT:  Right Ear: External ear normal.  Left Ear: External ear normal.  Eyes: EOM are normal.  Neck: Normal range of motion. Neck supple.  Cardiovascular: Normal heart sounds.   Respiratory: Effort normal and breath sounds normal.  GI: Soft. Bowel sounds are normal.  Musculoskeletal: Normal range of motion.  Neurological: She is alert and oriented to person, place, and time.  Skin: Skin is warm and dry.  Psychiatric: She has a normal mood and affect. Her behavior is normal. Judgment and thought content normal.    Encounter Medications:   Outpatient Encounter Prescriptions as of 05/20/2016   Medication Sig  . Biotin 1000 MCG tablet Take 1,000 mcg by mouth daily.  Marland Kitchen CARTIA XT 120 MG 24 hr capsule TAKE ONE CAPSULE BY MOUTH DAILY  . furosemide (LASIX) 20 MG tablet TAKE 1 TABLET BY MOUTH DAILY  . metoprolol tartrate (LOPRESSOR) 25 MG tablet Take 0.5 tablets (12.5 mg total) by mouth 2 (two) times daily.  . Multiple Vitamin (MULTIVITAMINS PO) Take 1 tablet by mouth daily.   . Omega-3 Fatty Acids (FISH OIL) 1000 MG CAPS Take 1,000 mg by mouth daily.  . potassium chloride SA (K-DUR,KLOR-CON) 20 MEQ tablet TAKE 1 TABLET BY MOUTH DAILY  . senna-docusate (SENOKOT-S) 8.6-50 MG tablet Take 1 tablet by mouth at bedtime.  Marland Kitchen warfarin (COUMADIN) 5 MG tablet Take 2.5-5 mg by mouth daily. Take 1/2 tablet on Sunday then take 1 tablet all the other days  . furosemide (LASIX) 40 MG tablet TAKE 1 TABLET BY MOUTH DAILY (Patient not taking: Reported on 05/20/2016)  . polyethylene glycol (MIRALAX / GLYCOLAX) packet Take 17 g by mouth as needed (for constipation). Reported on 03/25/2016   No facility-administered encounter medications on file as of 05/20/2016.     Functional Status:   In your present state of health, do you have any difficulty performing the following activities: 04/09/2016 03/11/2016  Hearing? N -  Vision? N -  Difficulty concentrating or making decisions? N -  Walking or climbing stairs? N -  Dressing or bathing? N -  Doing errands, shopping? Y N  Preparing Food and eating ? N -  Using the Toilet? N -  In the past six months, have you accidently leaked urine? N -  Do you have problems with loss of bowel control? N -  Managing your Medications? N -  Managing your Finances? N -  Housekeeping or managing your Housekeeping? N -  Some recent data might be hidden   BP 138/62 (BP Location: Right Arm, Patient Position: Sitting, Cuff Size: Normal)   Pulse 65   Resp 20   Ht 1.549 m (_0 )   Wt 143 lb (64.9 kg)   SpO2 98%   BMI 27.02 kg/m   Fall/Depression Screening:    PHQ 2/9  Scores 04/09/2016 01/07/2012 11/26/2011 10/28/2011  PHQ - 2 Score 0 0 0 0    Assessment:   Ongoing case management related to HF/HTN Ongoing bilateral swelling to lower extremities Follow up on adherence related medical appointments Follow up on adherence related to prescribed medications.      Plan:  Will verified pt continues to be in the GREEN zone with her HF and continues to deny any major issues however continues to have some ongoing swelling. Will also verify pt continues to take her daily BP checks (within normal ranges) with no reported issues or events. Will continue to encouraged adherence with daily monitoring for weights and low sodium diet. Also encouraged pt to continue to take her daily BP and review all educations material with parameters and when to contact her provider for interventions.  Will measure pt for compression stockings (Medium, Short in length). Will ordered via [facility] and supply pt when available. Will strongly encouraged pt to a wear the compression stockings to prevent ongoing swelling. Will encouraged pt to elevate her lower legs to reduce ongoing swelling throughout the day. RN will request [facility] to order the correct size measured ( pt requested 6 pair). Will verify pt continues to attend all medical appointments and continue to encourage adherence. Will also verify pt has sufficient transportation with no needed resources for transportation services. Will also verify pt has been adherent with her daily medications and aware of her medications for HF and HTN. Will continue to encouraged adherence. Will also verify no needed refills and pt has a sufficient supply on her daily medications. Plan of care discussed and pt has met several goals. Further discussion on weaning pt from the [facility] program. RN will follow up once again next month to evaluate pt's adherence with use of the compression stockings to reduce ongoing swelling. Will re-evaluate plan of care and active goals at  that time prior to possible discharge of this pt.   [name], RN Care Management Coordinator [place] Office [phone]

## 2016-05-27 ENCOUNTER — Ambulatory Visit (INDEPENDENT_AMBULATORY_CARE_PROVIDER_SITE_OTHER): Payer: Medicare Other | Admitting: Pharmacist

## 2016-05-27 DIAGNOSIS — Z952 Presence of prosthetic heart valve: Secondary | ICD-10-CM

## 2016-05-27 DIAGNOSIS — I482 Chronic atrial fibrillation, unspecified: Secondary | ICD-10-CM

## 2016-05-27 DIAGNOSIS — Z954 Presence of other heart-valve replacement: Secondary | ICD-10-CM

## 2016-05-27 DIAGNOSIS — Z7901 Long term (current) use of anticoagulants: Secondary | ICD-10-CM

## 2016-05-27 LAB — POCT INR: INR: 2.3

## 2016-05-28 ENCOUNTER — Encounter (INDEPENDENT_AMBULATORY_CARE_PROVIDER_SITE_OTHER): Payer: Self-pay

## 2016-05-28 ENCOUNTER — Ambulatory Visit (INDEPENDENT_AMBULATORY_CARE_PROVIDER_SITE_OTHER): Payer: Medicare Other | Admitting: Gastroenterology

## 2016-05-28 ENCOUNTER — Telehealth: Payer: Self-pay | Admitting: *Deleted

## 2016-05-28 ENCOUNTER — Encounter: Payer: Self-pay | Admitting: Gastroenterology

## 2016-05-28 VITALS — BP 132/74 | HR 66 | Ht 61.0 in | Wt 139.0 lb

## 2016-05-28 DIAGNOSIS — K625 Hemorrhage of anus and rectum: Secondary | ICD-10-CM | POA: Diagnosis not present

## 2016-05-28 DIAGNOSIS — K5900 Constipation, unspecified: Secondary | ICD-10-CM | POA: Diagnosis not present

## 2016-05-28 MED ORDER — NA SULFATE-K SULFATE-MG SULF 17.5-3.13-1.6 GM/177ML PO SOLN: 1.0000 | Freq: Once | ORAL | 0 refills | Status: AC

## 2016-05-28 NOTE — Telephone Encounter (Signed)
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 We will help coordinate this through our Coumadin clinic. We will provide her with a detailed schedule for this. Thanks [name], MD, [facility] [phone] office [phone] pager

## 2016-05-28 NOTE — Telephone Encounter (Signed)
  05/28/2016   RE: Carrie [name] DOB: 12/14/1940 MRN: [id number]   Dear Dr Sallyanne Kuster,   We have scheduled the above patient for an endoscopic procedure. Our records show that she is on anticoagulation therapy.   Please advise as to how long the patient may come off her therapy of Coumadin prior to the procedure, which is scheduled for 06/17/2016.  Please fax back/ or route the completed form to Izael Bessinger at [phone].   Sincerely,    Genella Mech CMA AAMA

## 2016-05-28 NOTE — Patient Instructions (Signed)
You have been scheduled for a colonoscopy. Please follow written instructions given to you at your visit today.  Please pick up your prep supplies at the pharmacy within the next 1-3 days. If you use inhalers (even only as needed), please bring them with you on the day of your procedure. Your physician has requested that you go to www.startemmi.com and enter the access code given to you at your visit today. This web site gives a general overview about your procedure. However, you should still follow specific instructions given to you by our office regarding your preparation for the procedure.   You will be contaced by our office prior to your procedure for directions on holding your Coumadin/Warfarin.  If you do not hear from our office 1 week prior to your scheduled procedure, please call [phone] to discuss.  Follow up as needed

## 2016-05-28 NOTE — Progress Notes (Signed)
Carrie [name]    [id number]    June 03, 1941  Primary Care Physician:BLAND,VEITA J, MD  Referring Physician: Lucianne Lei, MD [address] [place], [place] [address]  Chief complaint:  Constipation  HPI: 20 yr F is here for visit with c/o chronic constipation and intermittent bright red blood per rectum, small volume when she strains excessively. She is currently taking colace as needed and it seems to help. Denies any nausea, vomiting, abdominal pain, or melena Colonoscopy 2003 by Dr. Velora Heckler documented a very small polyp in her right colon that he did not remove, the pictures of this make it appear hyperplastic. Repeat colonoscopy 2007 done for the indication of "history of adenomatous polyps" showed diverticulosis only, no recurrent polyps. She was recommended for repeat colonoscopy at 10 year interval    Outpatient Encounter Prescriptions as of 05/28/2016  Medication Sig  . Biotin 1000 MCG tablet Take 1,000 mcg by mouth daily.  Marland Kitchen CARTIA XT 120 MG 24 hr capsule TAKE ONE CAPSULE BY MOUTH DAILY  . docusate sodium (COLACE) 100 MG capsule Take 200 mg by mouth daily.  . furosemide (LASIX) 20 MG tablet TAKE 1 TABLET BY MOUTH DAILY  . metoprolol tartrate (LOPRESSOR) 25 MG tablet Take 0.5 tablets (12.5 mg total) by mouth 2 (two) times daily.  . Multiple Vitamin (MULTIVITAMINS PO) Take 1 tablet by mouth daily.   . Omega-3 Fatty Acids (FISH OIL) 1000 MG CAPS Take 1,000 mg by mouth daily.  . polyethylene glycol (MIRALAX / GLYCOLAX) packet Take 17 g by mouth as needed (for constipation). Reported on 03/25/2016  . potassium chloride SA (K-DUR,KLOR-CON) 20 MEQ tablet TAKE 1 TABLET BY MOUTH DAILY  . warfarin (COUMADIN) 5 MG tablet Take 2.5-5 mg by mouth daily. Take 1/2 tablet on Sunday then take 1 tablet all the other days  . [DISCONTINUED] furosemide (LASIX) 40 MG tablet TAKE 1 TABLET BY MOUTH DAILY  . [DISCONTINUED] senna-docusate (SENOKOT-S) 8.6-50 MG tablet Take 1 tablet by mouth at  bedtime.   No facility-administered encounter medications on file as of 05/28/2016.     Allergies as of 05/28/2016 - Review Complete 05/28/2016  Allergen Reaction Noted  . Codeine Nausea And Vomiting     Past Medical History:  Diagnosis Date  . Anticoagulated on Coumadin    for mech valve and atrial fib  goal 2.0-2.5  . Anxiety   . Arthritis    "right shoulder" (03/15/2013)  . Atrial fibrillation ([place])   . Atrial flutter ([place])   . CHF (congestive heart failure) ([place])   . Chronic combined systolic and diastolic CHF (congestive heart failure) ([place])    a. 02/2016 Echo: EF 45-50%.  . Diverticula of colon   . Exertional shortness of breath   . GERD (gastroesophageal reflux disease)   . Heart murmur   . Hemorrhage intraabdominal 03/17/2012  . History of blood transfusion    "once" (03/15/2013)  . Hyperlipidemia   . Hypertension   . Liver hemorrhage   . Migraines   . 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 Bi-leaflet St. Jude mechanical prosthesis; b. 02/2016 Echo: EF 45-50%, some degree of MR, sev dil LA/RA, sev TR.  . NSTEMI (non-ST elevated myocardial infarction) ([place])    a. 02/2016 elev trop/Cath: nonobs dzs,   . Pacemaker    medtronic adapta  . Pneumonia 2009   resolved.? OPD Rx  . Severe tricuspid regurgitation    a. 02/2016 Echo: Ef  45-50%, sev TR, PASP 71mmHg.  . Sick sinus syndrome ([place])    Dr Cristopher Peru. EP study negative. Pacemaker 12/15/06 Medtronic  . Stroke [facility])    "they say I had a stroke last year" denies residual on 03/15/2013  . Subdural hematoma [facility])     Past Surgical History:  Procedure Laterality Date  . APPENDECTOMY    . CARDIAC CATHETERIZATION  04/02/97   R&L:severe MR/pulmonary hypertension  . CARDIAC CATHETERIZATION N/A 03/16/2016   Procedure: Left Heart Cath and Coronary Angiography;  Surgeon: [name], MD;  Location: [facility];  Service: Cardiovascular;  Laterality: N/A;  . CATARACT EXTRACTION W/ INTRAOCULAR LENS   IMPLANT, BILATERAL Bilateral 2013  . DILATION AND CURETTAGE OF UTERUS     "had fibroids" (03/15/2013)  . EXPLORATORY LAPAROTOMY     "had a growth on my intestines" (03/15/2013)  . HAMMER TOE SURGERY Right   . INSERT / REPLACE / REMOVE PACEMAKER  12/15/2006   Medtronic  . MITRAL VALVE REPLACEMENT  1998   St Jude mechanical; Dr. [name]  . PACEMAKER PLACEMENT  12/15/06   medtronic adapta for SSS  . PERMANENT PACEMAKER GENERATOR CHANGE N/A 07/24/2014   Procedure: PERMANENT PACEMAKER GENERATOR CHANGE;  Surgeon: [name], MD;  Location: [facility];  Service: Cardiovascular;  Laterality: N/A;  . PERSANTINE CARDIOLITE  08/07/03   mild inf. ischemia   . TEE WITHOUT CARDIOVERSION  09/23/2011   Procedure: TRANSESOPHAGEAL ECHOCARDIOGRAM (TEE);  Surgeon: [name];  Location: [facility];  Service: Cardiovascular;  Laterality: N/A;  . TONSILLECTOMY    . TUBAL LIGATION    . US ECHOCARDIOGRAPHY  11/19/2011   EF 50-55%,RA mod to severely dilated,LA severely dilated,trace MR,small vegetation or mass on the MV,AOV mildly scleroticmild PI, RV pressure 40-4mmHg    Family History  Problem Relation Age of Onset  . Cancer Mother   . Stroke Father   . Cancer Sister   . Leukemia Sister   . Healthy Brother   . Healthy Sister   . Diabetes Sister   . Healthy Brother   . Healthy Brother   . Heart attack Maternal Grandfather   . Kidney disease Daughter   . Stroke Brother     Social History   Social History  . Marital status: Married    Spouse name: N/A  . Number of children: N/A  . Years of education: N/A   Occupational History  . Not on file.   Social History Main Topics  . Smoking status: Never Smoker  . Smokeless tobacco: Never Used  . Alcohol use No  . Drug use: No  . Sexual activity: No   Other Topics Concern  . Not on file   Social History Narrative  . No narrative on file      Review of systems: Review of Systems  Constitutional: Negative for fever and chills.    HENT: Negative.   Eyes: Negative for blurred vision.  Respiratory: Negative for cough, shortness of breath and wheezing.   Cardiovascular: Negative for chest pain and palpitations.  Gastrointestinal: as per HPI Genitourinary: Negative for dysuria, urgency, frequency and hematuria.  Musculoskeletal: Negative for myalgias, back pain and joint pain.  Skin: Negative for itching and rash.  Neurological: Negative for dizziness, tremors, focal weakness, seizures and loss of consciousness.  Endo/Heme/Allergies: Positive for seasonal allergies.  Psychiatric/Behavioral: Negative for depression, suicidal ideas and hallucinations.  All other systems reviewed and are negative.   Physical Exam: Vitals:   05/28/16 1340  BP: 132/74  Pulse: 66   Body mass index is 26.26 kg/m. Gen:      No acute distress HEENT:  EOMI, sclera anicteric Neck:     No masses; no thyromegaly Lungs:    Clear to auscultation bilaterally; normal respiratory effort CV:         Regular rate and rhythm; no murmurs Abd:      + bowel sounds; soft, non-tender; no palpable masses, no distension Ext:    No edema; adequate peripheral perfusion Skin:      Warm and dry; no rash Neuro: alert and oriented x 3 Psych: normal mood and affect  Data Reviewed:  Reviewed labs, radiologic imaging, old records and pertinent past GI work up  Assessment and Plan/Recommendations:  56 yr F here with c/o chronic constipation and intermittent BRBPR likely secondary to internal hemorrhoids She is due for colonoscopy for colorectal cancer screening The risks and benefits as well as alternatives of endoscopic procedure(s) have been discussed and reviewed. All questions answered. The patient agrees to proceed. Discuss with prescribing MD regarding holding coumadin prior to the procedure Schedule for hemorrhoidal band ligation as needed after colonosocpy  Greater than 50% of the time used for counseling as well as treatment plan and follow-up.  She had multiple questions which were answered to her satisfaction  K. Denzil Magnuson , MD [phone] Mon-Fri 8a-5p [phone] after 5p, weekends, holidays  CC: Lucianne Lei, MD

## 2016-05-29 NOTE — Telephone Encounter (Signed)
Dr Nandigam FYI 

## 2016-05-29 NOTE — Telephone Encounter (Signed)
Spoke to patient - scheduled appt for 9/18 to come discuss lovenox bridge dosing and administration. Patient states she understands and appreciates call.

## 2016-06-04 ENCOUNTER — Encounter: Payer: Self-pay | Admitting: Gastroenterology

## 2016-06-08 ENCOUNTER — Ambulatory Visit (INDEPENDENT_AMBULATORY_CARE_PROVIDER_SITE_OTHER): Payer: Medicare Other | Admitting: Pharmacist Clinician (PhC)/ Clinical Pharmacy Specialist

## 2016-06-08 DIAGNOSIS — Z952 Presence of prosthetic heart valve: Secondary | ICD-10-CM

## 2016-06-08 DIAGNOSIS — Z954 Presence of other heart-valve replacement: Secondary | ICD-10-CM | POA: Diagnosis not present

## 2016-06-08 DIAGNOSIS — Z7901 Long term (current) use of anticoagulants: Secondary | ICD-10-CM | POA: Diagnosis not present

## 2016-06-08 DIAGNOSIS — I482 Chronic atrial fibrillation, unspecified: Secondary | ICD-10-CM

## 2016-06-08 LAB — POCT INR: INR: 1.9

## 2016-06-08 NOTE — Patient Instructions (Signed)
Enoxaprin Dosing Schedule  Enoxparin dose: 60 mg  Date  Warfarin Dose (evenings) Enoxaprin Dose  9-19 6 5  mg   9-20 5 0   9-21 4 0 9 am    9 pm  9-22 3 0 9 am    9 pm  9-23 2 0 9 am    9 pm  9-24 1 0 9 am  9-25 Procedure 2.5 mg (1/2 tabs)   9-26 1 7.5 mg (1.5 tabs) 9 am    9 pm  9-27 2 7.5 mg (1.5 tabs) 9 am    9 pm  9-28 3 5  mg (1 tab) 9 am    9 pm  9-29 4 5  mg (1 tab) 9 am    9 pm  9-30 5 5  mg (1 tab) 9 am    9 pm  10-1 6 5  mg (1 tab)     10-2  REPEAT INR

## 2016-06-09 ENCOUNTER — Telehealth: Payer: Self-pay | Admitting: *Deleted

## 2016-06-09 NOTE — Telephone Encounter (Signed)
Patient left a msg on the refill vm stating that an rx for enoxaparin was supposed to have been sent in for her yesterday. I looked the patient up by her name as she did not leave a dob or even a phone number, so I hope that this is the correct patient. She did not leave any further information. Thanks, MI

## 2016-06-10 MED ORDER — ENOXAPARIN SODIUM 60 MG/0.6ML ~~LOC~~ SOLN: 60.0000 mg | Freq: Two times a day (BID) | SUBCUTANEOUS | 0 refills | Status: DC

## 2016-06-10 NOTE — Telephone Encounter (Signed)
Rx sent to [facility]

## 2016-06-16 ENCOUNTER — Telehealth: Payer: Self-pay | Admitting: Cardiovascular Disease

## 2016-06-16 NOTE — Telephone Encounter (Signed)
Date  Warfarin Dose (evenings) Enoxaprin Dose  9-19 6 5  mg   9-20 5 0   9-21 4 0 9 am    9 pm  9-22 3 0 9 am    9 pm  9-23 2 0 9 am    9 pm  9-24 1 0 9 am  9-25 - >9/27 Wed  Procedure 2.5 mg (1/2 tabs)   9-26 - >9/28 thurs  1 7.5 mg (1.5 tabs) 9 am    9 pm  9-27 - >9/29 fri 2 7.5 mg (1.5 tabs) 9 am    9 pm  9-28 ->9/30 sat 3 5 mg (1 tab) 9 am    9 pm  9-29 ->10/1 sun 4 5 mg (1 tab) 9 am    9 pm  9-30 ->10/2 - appt day for follow up INR 5 5 mg (1 tab) 9 am    9 pm  10-1 ->10/3 6 5  mg (1 tab)    Patient states the procedure is scheduled for 9/27 not 9/25 and she is confused about dosing. She was wondering how to take 1/5 tablet (I believe she thought the 1.5 tablet was 1-1/5 of a tablet?).   The original instructions were given with procedure as being scheduled for 9/25; thus we will need to adjust dates as above.   Patient states she took 1/2 tablet yesterday (as per instructions) and did not take a shot. She did take a shot this morning. Have instructed her: No shot tonight or tomorrow. AND NO WARFARIN.   Went through instructions day by day to help her due to the confusion. At end of conversation she read back appropriate instructions.    Will route message to MD doing procedure since she did take 1/2 tablet of warfarin yesterday. I am doubtful this will increase her INR enough by tomorrow but want to make aware in case need to make changes.

## 2016-06-16 NOTE — Telephone Encounter (Signed)
New Message  Pt voiced she would like to know how much she needs to take with her coumadin medication.  Pt voiced what's 1/5th of 5 milligrams, which I know is 1 milligram but prefer for nurse or someone coumadin to provide pt with more information.  Please f/u with pt

## 2016-06-17 ENCOUNTER — Encounter: Payer: Medicare Other | Admitting: Gastroenterology

## 2016-06-17 ENCOUNTER — Ambulatory Visit: Payer: Self-pay | Admitting: *Deleted

## 2016-06-17 ENCOUNTER — Ambulatory Visit (AMBULATORY_SURGERY_CENTER): Payer: Medicare Other | Admitting: Gastroenterology

## 2016-06-17 ENCOUNTER — Encounter: Payer: Self-pay | Admitting: Gastroenterology

## 2016-06-17 VITALS — BP 124/66 | HR 62 | Temp 96.0°F | Resp 14 | Ht 61.0 in | Wt 139.0 lb

## 2016-06-17 DIAGNOSIS — D122 Benign neoplasm of ascending colon: Secondary | ICD-10-CM | POA: Diagnosis not present

## 2016-06-17 DIAGNOSIS — D123 Benign neoplasm of transverse colon: Secondary | ICD-10-CM | POA: Diagnosis not present

## 2016-06-17 DIAGNOSIS — K625 Hemorrhage of anus and rectum: Secondary | ICD-10-CM

## 2016-06-17 DIAGNOSIS — Z1211 Encounter for screening for malignant neoplasm of colon: Secondary | ICD-10-CM

## 2016-06-17 MED ORDER — SODIUM CHLORIDE 0.9 % IV SOLN: 500.0000 mL | INTRAVENOUS | Status: DC

## 2016-06-17 NOTE — Progress Notes (Signed)
Called to room to assist during endoscopic procedure.  Patient ID and intended procedure confirmed with present staff. Received instructions for my participation in the procedure from the performing physician.  

## 2016-06-17 NOTE — Op Note (Signed)
[facility] Patient Name: Carrie [name] Procedure Date: 06/17/2016 2:40 PM MRN: [id number] Endoscopist: Mauri Pole , MD Age: 75 Referring MD:  Date of Birth: 11/14/1940 Gender: Female Account #: [id number] Procedure:                Colonoscopy Indications:              Screening for colorectal malignant neoplasm, Last                            colonoscopy 10 years ago Medicines:                Monitored Anesthesia Care Procedure:                Pre-Anesthesia Assessment:                           - Prior to the procedure, a History and Physical                            was performed, and patient medications and                            allergies were reviewed. The patient's tolerance of                            previous anesthesia was also reviewed. The risks                            and benefits of the procedure and the sedation                            options and risks were discussed with the patient.                            All questions were answered, and informed consent                            was obtained. Prior Anticoagulants: The patient has                            taken no previous anticoagulant or antiplatelet                            agents. ASA Grade Assessment: III - A patient with                            severe systemic disease. After reviewing the risks                            and benefits, the patient was deemed in                            satisfactory condition to undergo the procedure.  After obtaining informed consent, the colonoscope                            was passed under direct vision. Throughout the                            procedure, the patient's blood pressure, pulse, and                            oxygen saturations were monitored continuously. The                            Model CF-HQ190L [id number]) scope was introduced                            through the anus and advanced  to the the cecum,                            identified by appendiceal orifice and ileocecal                            valve. The colonoscopy was performed without                            difficulty. The patient tolerated the procedure                            well. The quality of the bowel preparation was                            good. The ileocecal valve, appendiceal orifice, and                            rectum were photographed. Scope In: 2:56:52 PM Scope Out: 3:16:15 PM Scope Withdrawal Time: 0 hours 8 minutes 49 seconds  Total Procedure Duration: 0 hours 19 minutes 23 seconds  Findings:                 The perianal and digital rectal examinations were                            normal.                           A 3 mm polyp was found in the transverse colon. The                            polyp was sessile. The polyp was removed with a                            cold biopsy forceps. Resection and retrieval were                            complete.  Multiple small and large-mouthed diverticula were                            found in the entire colon. There was narrowing of                            the colon in association with the diverticular                            opening. There was evidence of diverticular spasm.                            There was no evidence of diverticular bleeding.                           Non-bleeding internal hemorrhoids were found during                            retroflexion. The hemorrhoids were medium-sized.                           The exam was otherwise without abnormality. Complications:            No immediate complications. Estimated Blood Loss:     Estimated blood loss was minimal. Impression:               - One 3 mm polyp in the transverse colon, removed                            with a cold biopsy forceps. Resected and retrieved.                           - Moderate diverticulosis in the entire  examined                            colon. There was narrowing of the colon in                            association with the diverticular opening. There                            was evidence of diverticular spasm. There was no                            evidence of diverticular bleeding.                           - Non-bleeding internal hemorrhoids.                           - The examination was otherwise normal. Recommendation:           - Patient has a contact number available for  emergencies. The signs and symptoms of potential                            delayed complications were discussed with the                            patient. Return to normal activities tomorrow.                            Written discharge instructions were provided to the                            patient.                           - Resume previous diet.                           - Continue present medications.                           - Resume Coumadin (warfarin) at prior dose today.                            Refer to [facility] for further adjustment of                            therapy.                           - Repeat colonoscopy in 5-10 years for surveillance                            based on pathology results.                           - Patient has a contact number available for                            emergencies. The signs and symptoms of potential                            delayed complications were discussed with the                            patient. Return to normal activities tomorrow.                            Written discharge instructions were provided to the                            patient.                           - Return to GI office PRN. Mauri Pole, MD 06/17/2016 3:25:47 PM This report has been signed electronically.

## 2016-06-17 NOTE — Progress Notes (Signed)
A and O x3. Report to RN. Tolerated MAC anesthesia well. 

## 2016-06-17 NOTE — Patient Instructions (Signed)
YOU HAD AN ENDOSCOPIC PROCEDURE TODAY AT [facility]:   Refer to the procedure report that was given to you for any specific questions about what was found during the examination.  If the procedure report does not answer your questions, please call your gastroenterologist to clarify.  If you requested that your care partner not be given the details of your procedure findings, then the procedure report has been included in a sealed envelope for you to review at your convenience later.  YOU SHOULD EXPECT: Some feelings of bloating in the abdomen. Passage of more gas than usual.  Walking can help get rid of the air that was put into your GI tract during the procedure and reduce the bloating. If you had a lower endoscopy (such as a colonoscopy or flexible sigmoidoscopy) you may notice spotting of blood in your stool or on the toilet paper. If you underwent a bowel prep for your procedure, you may not have a normal bowel movement for a few days.  Please Note:  You might notice some irritation and congestion in your nose or some drainage.  This is from the oxygen used during your procedure.  There is no need for concern and it should clear up in a day or so.  SYMPTOMS TO REPORT IMMEDIATELY:   Following lower endoscopy (colonoscopy or flexible sigmoidoscopy):  Excessive amounts of blood in the stool  Significant tenderness or worsening of abdominal pains  Swelling of the abdomen that is new, acute  Fever of 100F or higher   For urgent or emergent issues, a gastroenterologist can be reached at any hour by calling [phone].   DIET:  We do recommend a small meal at first, but then you may proceed to your regular diet.  Drink plenty of fluids but you should avoid alcoholic beverages for 24 hours.  ACTIVITY:  You should plan to take it easy for the rest of today and you should NOT DRIVE or use heavy machinery until tomorrow (because of the sedation medicines used during the test).     FOLLOW UP: Our staff will call the number listed on your records the next business day following your procedure to check on you and address any questions or concerns that you may have regarding the information given to you following your procedure. If we do not reach you, we will leave a message.  However, if you are feeling well and you are not experiencing any problems, there is no need to return our call.  We will assume that you have returned to your regular daily activities without incident.  If any biopsies were taken you will be contacted by phone or by letter within the next 1-3 weeks.  Please call us at [phone] if you have not heard about the biopsies in 3 weeks.    SIGNATURES/CONFIDENTIALITY: You and/or your care partner have signed paperwork which will be entered into your electronic medical record.  These signatures attest to the fact that that the information above on your After Visit Summary has been reviewed and is understood.  Full responsibility of the confidentiality of this discharge information lies with you and/or your care-partner.  Impressions/Recommendation:  Polyps, diverticulosis, hemorrhoids - handouts given.  Repeat colonoscopy in 5-10 years (2022-2027) based on pathology results.  Resume Coumadin at prior dose today.

## 2016-06-18 ENCOUNTER — Telehealth: Payer: Self-pay

## 2016-06-18 NOTE — Telephone Encounter (Signed)
  Follow up Call-  Call back number 06/17/2016  Post procedure Call Back phone  # [phone]  Permission to leave phone message Yes  Some recent data might be hidden     Patient questions:  Do you have a fever, pain , or abdominal swelling? No. Pain Score  0 *  Have you tolerated food without any problems? Yes.    Have you been able to return to your normal activities? Yes.    Do you have any questions about your discharge instructions: Diet   No. Medications  No. Follow up visit  No.  Do you have questions or concerns about your Care? No.  Actions: * If pain score is 4 or above: No action needed, pain <4.

## 2016-06-18 NOTE — Telephone Encounter (Signed)
ok 

## 2016-06-19 ENCOUNTER — Encounter: Payer: Self-pay | Admitting: *Deleted

## 2016-06-19 ENCOUNTER — Other Ambulatory Visit: Payer: Self-pay | Admitting: *Deleted

## 2016-06-19 NOTE — Patient Outreach (Addendum)
[facility]) Care Management   06/19/2016  Carrie [name] 06/04/1941 [id number]  Carrie [name] is an 75 y.o. female  Subjective:  HF: Pt reports ongoing daily weight with the last two days at 139 lbs and 141 lbs one week ago. Denies any signs or symptoms of HF with no swelling as continues to wear her compression stockings with no reported issues. Pt denies any swelling and continues to wear her compression stockings with no issues. After review of the HF zones pt able to report she is in the GREEN zone with no encountered issues. Pt reports no hospitalization and no ED visits over the last few months while involved with [facility] services. Pt very grateful for the [facility] services. SWELLING: Pt reports no swelling today but she continues to wear her compression stockings during the day to avoid swelling. Denies any issues related.  Objective:   Review of Systems  All other systems reviewed and are negative.   Physical Exam  Constitutional: She is oriented to person, place, and time. She appears well-developed and well-nourished.  HENT:  Right Ear: External ear normal.  Left Ear: External ear normal.  Eyes: EOM are normal.  Neck: Neck supple.  Cardiovascular: Normal heart sounds.   Respiratory: Effort normal and breath sounds normal.  GI: Soft. Bowel sounds are normal.  Musculoskeletal: Normal range of motion.  Neurological: She is alert and oriented to person, place, and time.  Skin: Skin is warm and dry.  Psychiatric: She has a normal mood and affect. Her behavior is normal. Judgment and thought content normal.    Encounter Medications:   Outpatient Encounter Prescriptions as of 06/19/2016  Medication Sig  . Biotin 1000 MCG tablet Take 1,000 mcg by mouth daily.  Marland Kitchen CARTIA XT 120 MG 24 hr capsule TAKE ONE CAPSULE BY MOUTH DAILY  . docusate sodium (COLACE) 100 MG capsule Take 200 mg by mouth daily.  Marland Kitchen enoxaparin (LOVENOX) 60 MG/0.6ML injection Inject 0.6 mLs (60 mg total)  into the skin every 12 (twelve) hours.  . furosemide (LASIX) 20 MG tablet TAKE 1 TABLET BY MOUTH DAILY  . metoprolol tartrate (LOPRESSOR) 25 MG tablet Take 0.5 tablets (12.5 mg total) by mouth 2 (two) times daily.  . Multiple Vitamin (MULTIVITAMINS PO) Take 1 tablet by mouth daily.   . Omega-3 Fatty Acids (FISH OIL) 1000 MG CAPS Take 1,000 mg by mouth daily.  . polyethylene glycol (MIRALAX / GLYCOLAX) packet Take 17 g by mouth as needed (for constipation). Reported on 03/25/2016  . potassium chloride SA (K-DUR,KLOR-CON) 20 MEQ tablet TAKE 1 TABLET BY MOUTH DAILY  . warfarin (COUMADIN) 5 MG tablet Take 2.5-5 mg by mouth daily. Take 1/2 tablet on Sunday then take 1 tablet all the other days   Facility-Administered Encounter Medications as of 06/19/2016  Medication  . 0.9 %  sodium chloride infusion    Functional Status:   In your present state of health, do you have any difficulty performing the following activities: 04/09/2016 03/11/2016  Hearing? N -  Vision? N -  Difficulty concentrating or making decisions? N -  Walking or climbing stairs? N -  Dressing or bathing? N -  Doing errands, shopping? Y N  Preparing Food and eating ? N -  Using the Toilet? N -  In the past six months, have you accidently leaked urine? N -  Do you have problems with loss of bowel control? N -  Managing your Medications? N -  Managing your Finances? N -  Housekeeping or managing your Housekeeping? N -  Some recent data might be hidden    Fall/Depression Screening:    PHQ 2/9 Scores 04/09/2016 01/07/2012 11/26/2011 10/28/2011  PHQ - 2 Score 0 0 0 0  BP 114/60 (BP Location: Right Arm, Patient Position: Sitting, Cuff Size: Normal)   Pulse 71   Resp 20   Wt 139 lb (63 kg)   SpO2 99%   BMI 26.26 kg/m    Assessment:  Ongoing case management related to HF Follow up on bilateral edema   Plan:  Will complete a physical assessment and verified pt remains in the the GREEN zone on the HF scale with ongoing daily  weights. To review the HF zones and progress over the last 3 months concerning pt's management of care along with all goals met. Pt is aware when to seek medical attention and when to contact her providers for interventions concerning his HF. Will also contact the [facility] office and inquire on the compression stockings pt ordered last month. Pt ordered 6 pair and order was placed to [facility] (CMA). RN requested Mrs. Greeson (CMA) to contact the pt directly for pick up at the [facility] office due to pt's discharge today via [facility] services. Will continue to encourage pt to elevate her lower legs with any ongoing swelling and to wear her compression stockings thoughout the daily and remove at night.  Will strongly encouraged pt to continue to manage her care independently with the assistance from her spouse and if any additional inquires or request to contact this RN case manager or the [facility] office directly (pt with understanding). RN will notify pt's provider of her discharge today from the [facility] program and services with no additional inquires. Case will be closed based upon pt's progress.   [name], RN Care Management Coordinator [place] Office [phone]

## 2016-06-22 ENCOUNTER — Ambulatory Visit (INDEPENDENT_AMBULATORY_CARE_PROVIDER_SITE_OTHER): Payer: Medicare Other | Admitting: Pharmacist Clinician (PhC)/ Clinical Pharmacy Specialist

## 2016-06-22 DIAGNOSIS — Z952 Presence of prosthetic heart valve: Secondary | ICD-10-CM | POA: Diagnosis not present

## 2016-06-22 DIAGNOSIS — I482 Chronic atrial fibrillation, unspecified: Secondary | ICD-10-CM

## 2016-06-22 DIAGNOSIS — Z7901 Long term (current) use of anticoagulants: Secondary | ICD-10-CM

## 2016-06-22 LAB — POCT INR: INR: 1.4

## 2016-06-22 MED ORDER — ENOXAPARIN SODIUM 60 MG/0.6ML ~~LOC~~ SOLN: 60.0000 mg | Freq: Two times a day (BID) | SUBCUTANEOUS | 0 refills | Status: DC

## 2016-06-25 ENCOUNTER — Ambulatory Visit (INDEPENDENT_AMBULATORY_CARE_PROVIDER_SITE_OTHER): Payer: Medicare Other | Admitting: Pharmacist

## 2016-06-25 DIAGNOSIS — Z7901 Long term (current) use of anticoagulants: Secondary | ICD-10-CM

## 2016-06-25 DIAGNOSIS — I482 Chronic atrial fibrillation, unspecified: Secondary | ICD-10-CM

## 2016-06-25 DIAGNOSIS — Z952 Presence of prosthetic heart valve: Secondary | ICD-10-CM | POA: Diagnosis not present

## 2016-06-25 LAB — POCT INR: INR: 2

## 2016-06-30 ENCOUNTER — Encounter: Payer: Self-pay | Admitting: Gastroenterology

## 2016-07-08 ENCOUNTER — Ambulatory Visit (INDEPENDENT_AMBULATORY_CARE_PROVIDER_SITE_OTHER): Payer: Medicare Other | Admitting: Pharmacist Clinician (PhC)/ Clinical Pharmacy Specialist

## 2016-07-08 DIAGNOSIS — Z7901 Long term (current) use of anticoagulants: Secondary | ICD-10-CM

## 2016-07-08 DIAGNOSIS — I482 Chronic atrial fibrillation, unspecified: Secondary | ICD-10-CM

## 2016-07-08 DIAGNOSIS — Z952 Presence of prosthetic heart valve: Secondary | ICD-10-CM | POA: Diagnosis not present

## 2016-07-08 LAB — POCT INR: INR: 1.4

## 2016-07-15 DIAGNOSIS — I110 Hypertensive heart disease with heart failure: Secondary | ICD-10-CM | POA: Diagnosis not present

## 2016-07-15 DIAGNOSIS — M130 Polyarthritis, unspecified: Secondary | ICD-10-CM | POA: Diagnosis not present

## 2016-07-15 DIAGNOSIS — I480 Paroxysmal atrial fibrillation: Secondary | ICD-10-CM | POA: Diagnosis not present

## 2016-07-15 DIAGNOSIS — J301 Allergic rhinitis due to pollen: Secondary | ICD-10-CM | POA: Diagnosis not present

## 2016-07-22 ENCOUNTER — Ambulatory Visit (INDEPENDENT_AMBULATORY_CARE_PROVIDER_SITE_OTHER): Payer: Medicare Other | Admitting: Pharmacist Clinician (PhC)/ Clinical Pharmacy Specialist

## 2016-07-22 DIAGNOSIS — I482 Chronic atrial fibrillation, unspecified: Secondary | ICD-10-CM

## 2016-07-22 DIAGNOSIS — Z952 Presence of prosthetic heart valve: Secondary | ICD-10-CM | POA: Diagnosis not present

## 2016-07-22 DIAGNOSIS — Z7901 Long term (current) use of anticoagulants: Secondary | ICD-10-CM

## 2016-07-22 LAB — POCT INR: INR: 1.6

## 2016-07-31 ENCOUNTER — Other Ambulatory Visit: Payer: Self-pay | Admitting: Cardiovascular Disease

## 2016-08-10 DIAGNOSIS — Z1231 Encounter for screening mammogram for malignant neoplasm of breast: Secondary | ICD-10-CM | POA: Diagnosis not present

## 2016-08-10 DIAGNOSIS — Z803 Family history of malignant neoplasm of breast: Secondary | ICD-10-CM | POA: Diagnosis not present

## 2016-08-11 ENCOUNTER — Ambulatory Visit (INDEPENDENT_AMBULATORY_CARE_PROVIDER_SITE_OTHER): Payer: Medicare Other | Admitting: Pharmacist

## 2016-08-11 ENCOUNTER — Ambulatory Visit (INDEPENDENT_AMBULATORY_CARE_PROVIDER_SITE_OTHER): Payer: Medicare Other | Admitting: Cardiovascular Disease

## 2016-08-11 ENCOUNTER — Encounter: Payer: Self-pay | Admitting: Cardiovascular Disease

## 2016-08-11 VITALS — BP 154/83 | HR 90 | Ht 61.0 in | Wt 142.6 lb

## 2016-08-11 DIAGNOSIS — Z952 Presence of prosthetic heart valve: Secondary | ICD-10-CM

## 2016-08-11 DIAGNOSIS — Z79899 Other long term (current) drug therapy: Secondary | ICD-10-CM | POA: Diagnosis not present

## 2016-08-11 DIAGNOSIS — I482 Chronic atrial fibrillation, unspecified: Secondary | ICD-10-CM

## 2016-08-11 DIAGNOSIS — Z7901 Long term (current) use of anticoagulants: Secondary | ICD-10-CM

## 2016-08-11 DIAGNOSIS — I5042 Chronic combined systolic (congestive) and diastolic (congestive) heart failure: Secondary | ICD-10-CM | POA: Diagnosis not present

## 2016-08-11 DIAGNOSIS — I071 Rheumatic tricuspid insufficiency: Secondary | ICD-10-CM

## 2016-08-11 DIAGNOSIS — Z950 Presence of cardiac pacemaker: Secondary | ICD-10-CM

## 2016-08-11 DIAGNOSIS — I2510 Atherosclerotic heart disease of native coronary artery without angina pectoris: Secondary | ICD-10-CM

## 2016-08-11 DIAGNOSIS — I2721 Secondary pulmonary arterial hypertension: Secondary | ICD-10-CM

## 2016-08-11 DIAGNOSIS — I4891 Unspecified atrial fibrillation: Secondary | ICD-10-CM | POA: Diagnosis not present

## 2016-08-11 DIAGNOSIS — I10 Essential (primary) hypertension: Secondary | ICD-10-CM

## 2016-08-11 HISTORY — DX: Atherosclerotic heart disease of native coronary artery without angina pectoris: I25.10

## 2016-08-11 LAB — CUP PACEART INCLINIC DEVICE CHECK
Battery Remaining Longevity: 100 mo
Brady Statistic RV Percent Paced: 34 %
Date Time Interrogation Session: 20171121111253
Implantable Lead Implant Date: 20080326
Implantable Lead Location: 753860
Implantable Lead Model: 4076
Lead Channel Pacing Threshold Amplitude: 1 V
Lead Channel Sensing Intrinsic Amplitude: 5.6 mV
Lead Channel Setting Pacing Amplitude: 2.5 V
MDC IDC MSMT BATTERY IMPEDANCE: 357 Ohm
MDC IDC MSMT BATTERY VOLTAGE: 2.77 V
MDC IDC MSMT LEADCHNL RA IMPEDANCE VALUE: 0 Ohm
MDC IDC MSMT LEADCHNL RV IMPEDANCE VALUE: 484 Ohm
MDC IDC MSMT LEADCHNL RV PACING THRESHOLD PULSEWIDTH: 0.4 ms
MDC IDC PG IMPLANT DT: 20151103
MDC IDC SET LEADCHNL RV PACING PULSEWIDTH: 0.4 ms
MDC IDC SET LEADCHNL RV SENSING SENSITIVITY: 2.8 mV

## 2016-08-11 LAB — POCT INR: INR: 1.6

## 2016-08-11 MED ORDER — DILTIAZEM HCL ER COATED BEADS 120 MG PO CP24: 120.0000 mg | ORAL_CAPSULE | Freq: Every day | ORAL | 3 refills | Status: DC

## 2016-08-11 NOTE — Progress Notes (Signed)
Patient ID: Carrie [name], female   DOB: [birth year], 75 y.o.   MRN: [id number] Patient ID: Carrie [name], female   DOB: Jul 14, 1941, 75 y.o.   MRN: [id number]    Cardiology Office Note    Date:  08/11/2016   ID:  Carrie [name], DOB March 19, 1941, MRN [id number]  PCP:  Elyn Peers, MD  Cardiologist:   [name], MD   Chief Complaint  Patient presents with  . Follow-up    pt c/o dizziness, sob and high BP    History of Present Illness:  Carrie [name] is a 75 y.o. 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  In December 2012 she had subarachnoid hemorrhage and in June 2013 she had intrahepatic hemorrhage requiring arterial embolization and we are keeping her INR in the lower range (2.0-2.5). She has normal coronary arteries by previous angiography (2004) normal left ventricular systolic function by echocardiography (Oct 2015).  03/11/2016 shows hospitalized with angina pectoris and was found to have a small NSTEMI with a peak troponin of around 5. Her INR was borderline at 1.98 on admission and she was mildly anemic with a hemoglobin of approximately 9. Coronary angiography was performed and showed minor nonobstructive disease. Left ventricular ejection fraction was around 45-50% and no clear culprit for infarction was identified, although the echo described inferolateral hypokinesis. Estimated PA pressure was moderately elevated at 58 mmHg. There was severe tricuspid insufficiency. At catheterization, normal left ventricular end-diastolic pressure of 13 mmHg was recorded.  She denies angina pectoris. She has  chronic mild/moderate ankle edema. She does have class II-III exertional dyspnea and her husband believes that she is "slowing down". She is often unsteady when she stands up. She sometimes needs help getting out of a chair. She has not had any new falls thankfully. She has not had any bleeding events, but recently her warfarin anticoagulation has been subtherapeutic. Her blood pressure has usually been slightly high.  Her atrial rhythm today looks rather organized, looks like a slow atypical atrial flutter with 2-1 AV block. The native QRS looks like an atypical left bundle branch block with a QRS duration of 132 ms.  Interrogation of her Medtronic Sensia single chamber pacemaker shows normal device function with an estimated generator longevity of 6.5-9 years, good histograms and the absence of significant episodes of high ventricular rate. She has a lower percentage of ventricular pacing of 34% (at lower rate limit of 50 bpm) . We decreased the lower rate limit to try to limit the frequency of ventricular pacing, since this seems to worsen failure.  Past Medical History:  Diagnosis Date  . Anticoagulated on Coumadin    for mech valve and atrial fib  goal 2.0-2.5  . Anxiety   . Arthritis    "right shoulder" (03/15/2013)  . Atrial fibrillation ([place])   . Atrial flutter ([place])   . CHF (congestive heart failure) ([place])   . Chronic combined systolic and diastolic CHF (congestive heart failure) ([place])    a. 02/2016 Echo: EF 45-50%.  . Diverticula of colon   . Exertional shortness of breath   . GERD (gastroesophageal reflux disease)   . Heart murmur   . Hemorrhage intraabdominal 03/17/2012  . History of  blood transfusion    "once" (03/15/2013)  . Hyperlipidemia   . Hypertension   . Liver hemorrhage   . Migraines   . 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 Bi-leaflet St. Jude mechanical prosthesis; b. 02/2016 Echo: EF 45-50%, some degree of MR, sev dil LA/RA, sev TR.  . NSTEMI (non-ST  elevated myocardial infarction) ([place])    a. 02/2016 elev trop/Cath: nonobs dzs,   . Pacemaker    medtronic adapta  . Pneumonia 2009   resolved.? OPD Rx  . Severe tricuspid regurgitation    a. 02/2016 Echo: Ef 45-50%, sev TR, PASP 39mmHg.  . Sick sinus syndrome ([place])    Dr Cristopher Peru. EP study negative. Pacemaker 12/15/06 Medtronic  . Stroke [facility])    "they say I had a stroke last year" denies residual on 03/15/2013  . Subdural hematoma [facility])     Past Surgical History:  Procedure Laterality Date  . APPENDECTOMY    . CARDIAC CATHETERIZATION  04/02/97   R&L:severe MR/pulmonary hypertension  . CARDIAC CATHETERIZATION N/A 03/16/2016   Procedure: Left Heart Cath and Coronary Angiography;  Surgeon: [name], MD;  Location: [facility] CV [facility];  Service: Cardiovascular;  Laterality: N/A;  . CATARACT EXTRACTION W/ INTRAOCULAR LENS  IMPLANT, BILATERAL Bilateral 2013  . DILATION AND CURETTAGE OF UTERUS     "had fibroids" (03/15/2013)  . EXPLORATORY LAPAROTOMY     "had a growth on my intestines" (03/15/2013)  . HAMMER TOE SURGERY Right   . INSERT / REPLACE / REMOVE PACEMAKER  12/15/2006   Medtronic  . MITRAL VALVE REPLACEMENT  1998   St Jude mechanical; Dr. [name]  . PACEMAKER PLACEMENT  12/15/06   medtronic adapta for SSS  . PERMANENT PACEMAKER GENERATOR CHANGE N/A 07/24/2014   Procedure: PERMANENT PACEMAKER GENERATOR CHANGE;  Surgeon: [name], MD;  Location: [facility] CATH LAB;  Service: Cardiovascular;  Laterality: N/A;  . PERSANTINE CARDIOLITE  08/07/03   mild inf. ischemia   . TEE WITHOUT CARDIOVERSION  09/23/2011   Procedure: TRANSESOPHAGEAL ECHOCARDIOGRAM (TEE);  Surgeon: [name];  Location: [facility] ENDOS[facility];  Service: Cardiovascular;  Laterality: N/A;  . TONSILLECTOMY    . TUBAL LIGATION    . US ECHOCARDIOGRAPHY  11/19/2011   EF 50-55%,RA mod to severely dilated,LA severely dilated,trace MR,small vegetation or mass on the MV,AOV mildly scleroticmild PI, RV pressure  40-10mmHg    Outpatient Medications Prior to Visit  Medication Sig Dispense Refill  . Biotin 1000 MCG tablet Take 1,000 mcg by mouth daily.    Marland Kitchen docusate sodium (COLACE) 100 MG capsule Take 200 mg by mouth daily.    . furosemide (LASIX) 20 MG tablet TAKE 1 TABLET BY MOUTH DAILY 90 tablet 0  . metoprolol tartrate (LOPRESSOR) 25 MG tablet Take 0.5 tablets (12.5 mg total) by mouth 2 (two) times daily. 180 tablet 3  . Multiple Vitamin (MULTIVITAMINS PO) Take 1 tablet by mouth daily.     . Omega-3 Fatty Acids (FISH OIL) 1000 MG CAPS Take 1,000 mg by mouth daily.    . polyethylene glycol (MIRALAX / GLYCOLAX) packet Take 17 g by mouth as needed (for constipation). Reported on 03/25/2016    . potassium chloride SA (K-DUR,KLOR-CON) 20 MEQ tablet TAKE 1 TABLET BY MOUTH DAILY 30 tablet 6  . warfarin (COUMADIN) 5 MG tablet TAKE 1 TABLET BY MOUTH DAILY AS DIRECTED 90 tablet 1  . CARTIA XT 120 MG 24 hr capsule TAKE ONE CAPSULE BY MOUTH DAILY 90 capsule 3  .  enoxaparin (LOVENOX) 60 MG/0.6ML injection Inject 0.6 mLs (60 mg total) into the skin every 12 (twelve) hours. 4 Syringe 0   Facility-Administered Medications Prior to Visit  Medication Dose Route Frequency Provider Last Rate Last Dose  . 0.9 %  sodium chloride infusion  500 mL Intravenous Continuous Mauri Pole, MD         Allergies:   Codeine   Social History   Social History  . Marital status: Married    Spouse name: N/A  . Number of children: N/A  . Years of education: N/A   Social History Main Topics  . Smoking status: Never Smoker  . Smokeless tobacco: Never Used  . Alcohol use No  . Drug use: No  . Sexual activity: No   Other Topics Concern  . Not on file   Social History Narrative  . No narrative on file     Family History:  The patient's family history includes Cancer in her mother and sister; Diabetes in her sister; Healthy in her brother, brother, brother, and sister; Heart attack in her maternal grandfather; Kidney  disease in her daughter; Leukemia in her sister; Stroke in her brother and father.   ROS:   Please see the history of present illness.    ROS All other systems reviewed and are negative.   PHYSICAL EXAM:   VS:  BP (!) 154/83 (BP Location: Right Arm, Patient Position: Sitting, Cuff Size: Normal)   Pulse 90   Ht 5\' 1"  (1.549 m)   Wt 142 lb 9.6 oz (64.7 kg)   SpO2 98%   BMI 26.94 kg/m    GEN: Well nourished, well developed, in no acute distress  HEENT: normal  Neck: 8-9 cm elevated JVP, very prominent V waves, no carotid bruits or masses Cardiac: Irregular, crisp prosthetic valve clicks;  no murmurs, rubs, or gallops, 1+ ankle and pretibial edema, wearingcompression stockings  Respiratory:  clear to auscultation bilaterally, normal work of breathing GI: soft, nontender, nondistended, + BS MS: no deformity or atrophy  Skin: warm and dry, no rash Neuro:  Alert and Oriented x 3, Strength and sensation are intact Psych: euthymic mood, full affect  Wt Readings from Last 3 Encounters:  08/11/16 142 lb 9.6 oz (64.7 kg)  06/19/16 139 lb (63 kg)  06/17/16 139 lb (63 kg)      Studies/Labs Reviewed:   EKG:  EKG is ordered today.  . It appears to show slow atrial flutter or atrial tachycardia with 2:1 AV block, atypical left bundle branch block, QRS 132 ms, QTC 479 ms  Recent Labs: 03/21/2016: ALT 28; Magnesium 2.1 03/23/2016: BUN 18; Creatinine, Ser 1.10; Potassium 3.7; Sodium 135 04/14/2016: Hemoglobin 10.4; Platelets 221    ASSESSMENT:    1. Chronic combined systolic and diastolic congestive heart failure ([place])   2. PAH (pulmonary artery hypertension)   3. Atrial fibrillation with slow ventricular response ([place])   4. Medication management   5. Chronic anticoagulation, ( INR goal 2.0-2.5 due to history of subdural hematoma and liver hemorrhage)   6. Pacemaker - single chamber Medtronic Adapta, 2008   7. S/P mitral valve replacement, St Jude   8. Moderate to severe tricuspid  regurgitation   9. Essential hypertension   10. Coronary artery disease involving native coronary artery of native heart without angina pectoris      PLAN:  In order of problems listed above:  1. CHF: Seems mildly hypervolemic, NYHA functional class II. We have managed to reduce the high  frequency of ventricular pacing which probably contributed to the previous problems heart failure. Very brief episodes of high ventricular rate have been seen since her previous device check. Will increase diuretic dose. Note the fact that she has moderate pulmonary artery hypertension with normal left ventricular end-diastolic pressure. She may have fixed pulmonary hypertension related to previous mitral valve disease. 2. PAH: Normal LVEDP, normally functioning mitral valve prosthesis. She may have some degree of fixed pulmonary hypertension related to previous mitral valve disease. No clear symptoms of obstructive sleep apnea, no history of pulmonary embolism or COPD 3. AFib: I think we have a reasonable balance between avoiding high ventricular rates and avoiding excessive ventricular pacing on the current dose of rate control medication 4. Anticoagulation with previous severe bleeding complications, but also with possible recent thromboembolic event, keeping INR very tightly in the 2.0-2.5 range 5. PPM: Normal device function, deep at the current device settings. Check every 3 months via either office visit or remote downloads. 6. Mechanical mitral valve replacement: She has a large diameter valve that had normal function by a relatively recent echocardiogram. 7. Moderate to severe TR is probably the major cause of her edema and the jugular venous distention. She may have some degree of rheumatic valve disease and it appears she has moderate pulmonary artery hypertension her most recent echo. The presence of a transvalvular pacemaker lead may also be contributory. Physical exam is compatible with severe tricuspid  regurgitation. I don't think should be a great candidate for repeat surgery at age 71, but this opportunity might need to be taken if there is evidence of serious hemodynamic complications or liver abnormalities. 8. HTN is not well controlled reevaluate after diuresis 9. Mild CAD and small NSTEMI: Unclear if this was embolic since she was subtherapeutic anticoagulated at the time    Medication Adjustments/Labs and Tests Ordered: Current medicines are reviewed at length with the patient today.  Concerns regarding medicines are outlined above.  Medication changes, Labs and Tests ordered today are listed in the Patient Instructions below. Patient Instructions  Medication Instructions: Dr Sallyanne Kuster has recommended making the following medication changes: 1. INCREASE Furosemide 2. INCREASE Potassium >>Please call the office to speak with me, [place] at [phone], about your medicines.   Labwork: Your physician recommends that you return for lab work on 09/02/2016.  Testing/Procedures: NONE ORDERED  Follow-up: Dr Sallyanne Kuster recommends that you schedule a follow-up appointment in 3 months with a pacemaker check.  If you need a refill on your cardiac medications before your next appointment, please call your pharmacy.      Signed, [name], MD  08/11/2016 5:43 PM    [address], [place]  [address] Phone: [phone]; Fax: [phone]

## 2016-08-11 NOTE — Patient Instructions (Addendum)
Medication Instructions: Dr Sallyanne Kuster has recommended making the following medication changes: 1. INCREASE Furosemide to 40 mg daily 2. INCREASE Potassium to 40 mEq daily  Labwork: Your physician recommends that you return for lab work on 09/02/2016.  Testing/Procedures: NONE ORDERED  Follow-up: Dr Sallyanne Kuster recommends that you schedule a follow-up appointment in 3 months with a pacemaker check.  If you need a refill on your cardiac medications before your next appointment, please call your pharmacy.

## 2016-08-12 MED ORDER — FUROSEMIDE 40 MG PO TABS: 40.0000 mg | ORAL_TABLET | Freq: Every day | ORAL | 3 refills | Status: DC

## 2016-08-12 MED ORDER — POTASSIUM CHLORIDE CRYS ER 20 MEQ PO TBCR: 40.0000 meq | EXTENDED_RELEASE_TABLET | Freq: Every day | ORAL | 3 refills | Status: DC

## 2016-08-12 NOTE — Addendum Note (Signed)
Addended by: Diana Eves on: 08/12/2016 06:10 PM   Modules accepted: Orders

## 2016-08-19 ENCOUNTER — Encounter: Payer: Self-pay | Admitting: Cardiovascular Disease

## 2016-09-02 ENCOUNTER — Ambulatory Visit (INDEPENDENT_AMBULATORY_CARE_PROVIDER_SITE_OTHER): Payer: Medicare Other | Admitting: Pharmacist

## 2016-09-02 DIAGNOSIS — Z79899 Other long term (current) drug therapy: Secondary | ICD-10-CM | POA: Diagnosis not present

## 2016-09-02 DIAGNOSIS — I482 Chronic atrial fibrillation, unspecified: Secondary | ICD-10-CM

## 2016-09-02 DIAGNOSIS — Z952 Presence of prosthetic heart valve: Secondary | ICD-10-CM

## 2016-09-02 DIAGNOSIS — Z7901 Long term (current) use of anticoagulants: Secondary | ICD-10-CM | POA: Diagnosis not present

## 2016-09-02 DIAGNOSIS — I5042 Chronic combined systolic (congestive) and diastolic (congestive) heart failure: Secondary | ICD-10-CM | POA: Diagnosis not present

## 2016-09-02 LAB — POCT INR: INR: 1.5

## 2016-09-03 LAB — BASIC METABOLIC PANEL
BUN: 17 mg/dL (ref 7–25)
CALCIUM: 10 mg/dL (ref 8.6–10.4)
CO2: 21 mmol/L (ref 20–31)
CREATININE: 1.01 mg/dL — AB (ref 0.60–0.93)
Chloride: 102 mmol/L (ref 98–110)
GLUCOSE: 106 mg/dL — AB (ref 65–99)
Potassium: 4.7 mmol/L (ref 3.5–5.3)
Sodium: 138 mmol/L (ref 135–146)

## 2016-09-03 LAB — MAGNESIUM: Magnesium: 2.2 mg/dL (ref 1.5–2.5)

## 2016-09-23 ENCOUNTER — Ambulatory Visit (INDEPENDENT_AMBULATORY_CARE_PROVIDER_SITE_OTHER): Payer: Medicare Other | Admitting: Pharmacist

## 2016-09-23 DIAGNOSIS — Z952 Presence of prosthetic heart valve: Secondary | ICD-10-CM

## 2016-09-23 DIAGNOSIS — I482 Chronic atrial fibrillation, unspecified: Secondary | ICD-10-CM

## 2016-09-23 DIAGNOSIS — Z7901 Long term (current) use of anticoagulants: Secondary | ICD-10-CM

## 2016-09-23 LAB — POCT INR: INR: 2

## 2016-09-29 ENCOUNTER — Encounter (HOSPITAL_COMMUNITY): Payer: Self-pay | Admitting: Emergency Medicine

## 2016-09-29 ENCOUNTER — Inpatient Hospital Stay (HOSPITAL_COMMUNITY)
Admission: EM | Admit: 2016-09-29 | Discharge: 2016-10-08 | DRG: 246 | Disposition: A | Discharge status: Home Health | Payer: Medicare Other | Attending: Cardiovascular Disease | Admitting: Cardiovascular Disease

## 2016-09-29 ENCOUNTER — Emergency Department (HOSPITAL_COMMUNITY): Payer: Medicare Other

## 2016-09-29 DIAGNOSIS — Z950 Presence of cardiac pacemaker: Secondary | ICD-10-CM

## 2016-09-29 DIAGNOSIS — I219 Acute myocardial infarction, unspecified: Secondary | ICD-10-CM | POA: Diagnosis not present

## 2016-09-29 DIAGNOSIS — Z79899 Other long term (current) drug therapy: Secondary | ICD-10-CM | POA: Diagnosis not present

## 2016-09-29 DIAGNOSIS — K5900 Constipation, unspecified: Secondary | ICD-10-CM | POA: Diagnosis present

## 2016-09-29 DIAGNOSIS — I480 Paroxysmal atrial fibrillation: Secondary | ICD-10-CM

## 2016-09-29 DIAGNOSIS — I071 Rheumatic tricuspid insufficiency: Secondary | ICD-10-CM | POA: Diagnosis not present

## 2016-09-29 DIAGNOSIS — Z823 Family history of stroke: Secondary | ICD-10-CM | POA: Diagnosis not present

## 2016-09-29 DIAGNOSIS — Z961 Presence of intraocular lens: Secondary | ICD-10-CM | POA: Diagnosis not present

## 2016-09-29 DIAGNOSIS — Z9842 Cataract extraction status, left eye: Secondary | ICD-10-CM | POA: Diagnosis not present

## 2016-09-29 DIAGNOSIS — Z833 Family history of diabetes mellitus: Secondary | ICD-10-CM | POA: Diagnosis not present

## 2016-09-29 DIAGNOSIS — I2510 Atherosclerotic heart disease of native coronary artery without angina pectoris: Secondary | ICD-10-CM | POA: Diagnosis not present

## 2016-09-29 DIAGNOSIS — I5033 Acute on chronic diastolic (congestive) heart failure: Secondary | ICD-10-CM | POA: Diagnosis not present

## 2016-09-29 DIAGNOSIS — Z8673 Personal history of transient ischemic attack (TIA), and cerebral infarction without residual deficits: Secondary | ICD-10-CM

## 2016-09-29 DIAGNOSIS — Z885 Allergy status to narcotic agent status: Secondary | ICD-10-CM

## 2016-09-29 DIAGNOSIS — Z7901 Long term (current) use of anticoagulants: Secondary | ICD-10-CM | POA: Diagnosis not present

## 2016-09-29 DIAGNOSIS — R079 Chest pain, unspecified: Secondary | ICD-10-CM | POA: Diagnosis not present

## 2016-09-29 DIAGNOSIS — I447 Left bundle-branch block, unspecified: Secondary | ICD-10-CM | POA: Diagnosis not present

## 2016-09-29 DIAGNOSIS — Z806 Family history of leukemia: Secondary | ICD-10-CM

## 2016-09-29 DIAGNOSIS — E7800 Pure hypercholesterolemia, unspecified: Secondary | ICD-10-CM | POA: Diagnosis present

## 2016-09-29 DIAGNOSIS — R0789 Other chest pain: Secondary | ICD-10-CM | POA: Diagnosis not present

## 2016-09-29 DIAGNOSIS — I110 Hypertensive heart disease with heart failure: Secondary | ICD-10-CM | POA: Diagnosis present

## 2016-09-29 DIAGNOSIS — Z952 Presence of prosthetic heart valve: Secondary | ICD-10-CM | POA: Diagnosis not present

## 2016-09-29 DIAGNOSIS — I214 Non-ST elevation (NSTEMI) myocardial infarction: Secondary | ICD-10-CM | POA: Diagnosis not present

## 2016-09-29 DIAGNOSIS — I739 Peripheral vascular disease, unspecified: Secondary | ICD-10-CM | POA: Diagnosis present

## 2016-09-29 DIAGNOSIS — R011 Cardiac murmur, unspecified: Secondary | ICD-10-CM | POA: Diagnosis present

## 2016-09-29 DIAGNOSIS — I081 Rheumatic disorders of both mitral and tricuspid valves: Secondary | ICD-10-CM | POA: Diagnosis present

## 2016-09-29 DIAGNOSIS — I771 Stricture of artery: Secondary | ICD-10-CM | POA: Diagnosis present

## 2016-09-29 DIAGNOSIS — I252 Old myocardial infarction: Secondary | ICD-10-CM

## 2016-09-29 DIAGNOSIS — I639 Cerebral infarction, unspecified: Secondary | ICD-10-CM | POA: Diagnosis not present

## 2016-09-29 DIAGNOSIS — Z9841 Cataract extraction status, right eye: Secondary | ICD-10-CM | POA: Diagnosis not present

## 2016-09-29 DIAGNOSIS — Z955 Presence of coronary angioplasty implant and graft: Secondary | ICD-10-CM

## 2016-09-29 DIAGNOSIS — R0602 Shortness of breath: Secondary | ICD-10-CM | POA: Diagnosis not present

## 2016-09-29 DIAGNOSIS — Z8249 Family history of ischemic heart disease and other diseases of the circulatory system: Secondary | ICD-10-CM

## 2016-09-29 DIAGNOSIS — I481 Persistent atrial fibrillation: Secondary | ICD-10-CM | POA: Diagnosis not present

## 2016-09-29 DIAGNOSIS — I5032 Chronic diastolic (congestive) heart failure: Secondary | ICD-10-CM | POA: Diagnosis present

## 2016-09-29 LAB — CBC
HEMATOCRIT: 32.6 % — AB (ref 36.0–46.0)
HEMOGLOBIN: 10.9 g/dL — AB (ref 12.0–15.0)
MCH: 27.6 pg (ref 26.0–34.0)
MCHC: 33.4 g/dL (ref 30.0–36.0)
MCV: 82.5 fL (ref 78.0–100.0)
Platelets: 241 10*3/uL (ref 150–400)
RBC: 3.95 MIL/uL (ref 3.87–5.11)
RDW: 20.3 % — AB (ref 11.5–15.5)
WBC: 6.3 10*3/uL (ref 4.0–10.5)

## 2016-09-29 LAB — I-STAT TROPONIN, ED
TROPONIN I, POC: 0.03 ng/mL (ref 0.00–0.08)
TROPONIN I, POC: 1.93 ng/mL — AB (ref 0.00–0.08)
Troponin i, poc: 5.07 ng/mL (ref 0.00–0.08)

## 2016-09-29 LAB — BASIC METABOLIC PANEL
ANION GAP: 7 (ref 5–15)
BUN: 18 mg/dL (ref 6–20)
CALCIUM: 10.2 mg/dL (ref 8.9–10.3)
CO2: 26 mmol/L (ref 22–32)
CREATININE: 1 mg/dL (ref 0.44–1.00)
Chloride: 104 mmol/L (ref 101–111)
GFR calc Af Amer: >60 mL/min (ref >60)
GFR, EST NON AFRICAN AMERICAN: 54 mL/min — AB (ref >60)
Glucose, Bld: 119 mg/dL — ABNORMAL HIGH (ref 65–99)
Potassium: 5.2 mmol/L — ABNORMAL HIGH (ref 3.5–5.1)
SODIUM: 137 mmol/L (ref 135–145)

## 2016-09-29 LAB — PROTIME-INR
INR: 1.71
Prothrombin Time: 20.3 seconds — ABNORMAL HIGH (ref 11.4–15.2)

## 2016-09-29 MED ORDER — HEPARIN BOLUS VIA INFUSION
3000.0000 [IU] | Freq: Once | INTRAVENOUS | Status: AC
  Administered 2016-09-29: 3000 [IU] via INTRAVENOUS
  Filled 2016-09-29: qty 3000

## 2016-09-29 MED ORDER — NITROGLYCERIN 2 % TD OINT
0.5000 [in_us] | TOPICAL_OINTMENT | Freq: Once | TRANSDERMAL | Status: AC
  Administered 2016-09-29: 0.5 [in_us] via TOPICAL
  Filled 2016-09-29: qty 1

## 2016-09-29 MED ORDER — HEPARIN (PORCINE) IN NACL 100-0.45 UNIT/ML-% IJ SOLN
750.0000 [IU]/h | INTRAMUSCULAR | Status: DC
  Administered 2016-09-29: 700 [IU]/h via INTRAVENOUS
  Filled 2016-09-29: qty 250

## 2016-09-29 NOTE — ED Triage Notes (Signed)
Pt was using bathroom having BM when she began experiencing CP midsternal, nonradiating, at 1530 today. Vomited x 1 at 1600.  EMS gave ASA 324mg  po.  CP stopped.

## 2016-09-29 NOTE — Progress Notes (Signed)
ANTICOAGULATION CONSULT NOTE - Initial Consult  Pharmacy Consult for heparin Indication: chest pain/ACS  Allergies  Allergen Reactions  . Codeine Nausea And Vomiting    Patient Measurements: Height: 5\' 1"  (154.9 cm) Weight: 133 lb (60.3 kg) IBW/kg (Calculated) : 47.8 Heparin Dosing Weight: 52 kg  Vital Signs: BP: 137/72 (01/09 2045) Pulse Rate: 75 (01/09 2030)  Labs:  Recent Labs  09/29/16 1756 09/29/16 1837  HGB 10.9*  --   HCT 32.6*  --   PLT 241  --   LABPROT  --  20.3*  INR  --  1.71  CREATININE 1.00  --     Estimated Creatinine Clearance: 41.1 mL/min (by C-G formula based on SCr of 1 mg/dL).   Medical History: Past Medical History:  Diagnosis Date  . Anticoagulated on Coumadin    for mech valve and atrial fib  goal 2.0-2.5  . Anxiety   . Arthritis    "right shoulder" (03/15/2013)  . Atrial fibrillation ([place])   . Atrial flutter ([place])   . CAD (coronary artery disease) 08/11/2016  . CHF (congestive heart failure) ([place])   . Chronic combined systolic and diastolic CHF (congestive heart failure) ([place])    a. 02/2016 Echo: EF 45-50%.  . Diverticula of colon   . Exertional shortness of breath   . GERD (gastroesophageal reflux disease)   . Heart murmur   . Hemorrhage intraabdominal 03/17/2012  . History of blood transfusion    "once" (03/15/2013)  . Hyperlipidemia   . Hypertension   . Liver hemorrhage   . Migraines   . 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 Bi-leaflet St. Jude mechanical prosthesis; b. 02/2016 Echo: EF 45-50%, some degree of MR, sev dil LA/RA, sev TR.  . NSTEMI (non-ST elevated myocardial infarction) ([place])    a. 02/2016 elev trop/Cath: nonobs dzs,   . Pacemaker    medtronic adapta  . Pneumonia 2009   resolved.? OPD Rx  . Severe tricuspid regurgitation    a. 02/2016 Echo: Ef 45-50%, sev TR, PASP 33mmHg.  . Sick sinus syndrome ([place])    Dr Cristopher Peru. EP study negative. Pacemaker 12/15/06 Medtronic  . Stroke [facility])    "they  say I had a stroke last year" denies residual on 03/15/2013  . Subdural hematoma [facility])      Assessment: 76 yo female admitted with chest pain, trop +, pt received aspirin in EMS prior to arrival. Pt has AFib and is on warfarin prior to admission. Her admit INR is 1.7, last dose of warfarin was yesterday. Will begin heparin infusion for NSTEMI. hgb 10.9, plts wnl.   Goal of Therapy:  Heparin level 0.3-0.7 units/ml Monitor platelets by anticoagulation protocol: Yes    Plan:  -Heparin 3000 units x1 then infuse heparin at 700 units/hr -Daily HL, CBC -First level tomorrow morning      Harvel Quale 09/29/2016,9:43 PM

## 2016-09-29 NOTE — ED Notes (Signed)
Patient transported to X-ray 

## 2016-09-29 NOTE — H&P (Signed)
History & Physical    Patient ID: Carrie [name] MRN: [id number], DOB/AGE: [birth year]   Admit date: 09/29/2016   Primary Physician: Elyn Peers, MD Primary Cardiologist: [place]  Patient Profile    Carrie [name] is a 76 y.o. 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  She presents with new onset of CP.Stated at 3 pm. Today. She was in restroom,using it.  First retrosternal pain, then NV with emesis. Pain was similar to pain last June when she had NSTEMI. No PND, orthopnea or LE. She has stable DOE (NYHA II).   Came in via EMS. ASA given and nitro given with mild relieve of symptoms. Trop is climbing and now >1. I was called for evaluation   03/11/2016 shows hospitalized with angina pectoris and was found to have a small NSTEMI with a peak troponin of around 5. Her INR was borderline at 1.98 on admission and she was mildly anemic with a hemoglobin of approximately 9. Coronary angiography was performed and showed minor nonobstructive disease. Left ventricular ejection fraction was around 45-50% and no clear culprit for infarction was identified, although the echo described inferolateral hypokinesis. Estimated PA pressure was moderately elevated at 58 mmHg. There was severe tricuspid insufficiency. At catheterization, normal left ventricular end-diastolic pressure of 13 mmHg was recorded.  TTE in 6/20167: with EF 40-50, MVR Past Medical History    Past Medical History:  Diagnosis Date  . Anticoagulated on Coumadin    for mech valve and atrial fib  goal 2.0-2.5  . Anxiety   . Arthritis    "right shoulder"  (03/15/2013)  . Atrial fibrillation ([place])   . Atrial flutter ([place])   . CAD (coronary artery disease) 08/11/2016  . CHF (congestive heart failure) ([place])   . Chronic combined systolic and diastolic CHF (congestive heart failure) ([place])    a. 02/2016 Echo: EF 45-50%.  . Diverticula of colon   . Exertional shortness of breath   . GERD (gastroesophageal reflux disease)   . Heart murmur   . Hemorrhage intraabdominal 03/17/2012  . History of blood transfusion    "once" (03/15/2013)  . Hyperlipidemia   . Hypertension   . Liver hemorrhage   . Migraines   . 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 Bi-leaflet St. Jude mechanical prosthesis; b. 02/2016 Echo: EF 45-50%, some degree of MR, sev dil LA/RA, sev TR.  . NSTEMI (non-ST elevated myocardial infarction) ([place])    a. 02/2016 elev trop/Cath: nonobs dzs,   . Pacemaker    medtronic adapta  . Pneumonia 2009   resolved.? OPD Rx  . Severe tricuspid regurgitation    a. 02/2016 Echo: Ef 45-50%, sev TR, PASP 18mmHg.  . Sick sinus syndrome ([place])    Dr Cristopher Peru. EP study negative. Pacemaker 12/15/06 Medtronic  . Stroke [facility])    "they say I had a stroke last year" denies residual on 03/15/2013  . Subdural hematoma [facility])     Past Surgical History:  Procedure Laterality Date  . APPENDECTOMY    . CARDIAC CATHETERIZATION  04/02/97   R&L:severe MR/pulmonary hypertension  . CARDIAC CATHETERIZATION N/A 03/16/2016   Procedure: Left Heart Cath and Coronary Angiography;  Surgeon: [name], MD;  Location:  [place] INVASIVE CV LAB;  Service: Cardiovascular;  Laterality: N/A;  . CATARACT EXTRACTION W/ INTRAOCULAR LENS  IMPLANT, BILATERAL Bilateral 2013  . DILATION AND CURETTAGE OF UTERUS     "had fibroids" (03/15/2013)  . EXPLORATORY LAPAROTOMY     "had a growth on my intestines" (03/15/2013)  . HAMMER TOE SURGERY Right   . INSERT / REPLACE / REMOVE PACEMAKER  12/15/2006   Medtronic  . MITRAL VALVE REPLACEMENT  1998   St Jude mechanical; Dr.  [name]  . PACEMAKER PLACEMENT  12/15/06   medtronic adapta for SSS  . PERMANENT PACEMAKER GENERATOR CHANGE N/A 07/24/2014   Procedure: PERMANENT PACEMAKER GENERATOR CHANGE;  Surgeon: [name], MD;  Location: [place] CATH LAB;  Service: Cardiovascular;  Laterality: N/A;  . PERSANTINE CARDIOLITE  08/07/03   mild inf. ischemia   . TEE WITHOUT CARDIOVERSION  09/23/2011   Procedure: TRANSESOPHAGEAL ECHOCARDIOGRAM (TEE);  Surgeon: [name];  [facility];  Service: Cardiovascular;  Laterality: N/A;  . TONSILLECTOMY    . TUBAL LIGATION    . US ECHOCARDIOGRAPHY  11/19/2011   EF 50-55%,RA mod to severely dilated,LA severely dilated,trace MR,small vegetation or mass on the MV,AOV mildly scleroticmild PI, RV pressure 40-36mmHg     Allergies  Allergies  Allergen Reactions  . Codeine Nausea And Vomiting      Home Medications    Prior to Admission medications   Medication Sig Start Date End Date Taking? Authorizing Provider  Biotin 1000 MCG tablet Take 1,000 mcg by mouth daily.   Yes Historical Provider, MD  diltiazem (CARTIA XT) 120 MG 24 hr capsule Take 1 capsule (120 mg total) by mouth daily. 08/11/16  Yes Mihai Croitoru, MD  docusate sodium (COLACE) 100 MG capsule Take 200 mg by mouth 3 (three) times daily.    Yes Historical Provider, MD  furosemide (LASIX) 40 MG tablet Take 1 tablet (40 mg total) by mouth daily. 08/12/16  Yes Mihai Croitoru, MD  metoprolol tartrate (LOPRESSOR) 25 MG tablet Take 0.5 tablets (12.5 mg total) by mouth 2 (two) times daily. 03/22/16  Yes Florencia Reasons, MD  Multiple Vitamin (MULTIVITAMINS PO) Take 1 tablet by mouth daily.    Yes Historical Provider, MD  Omega-3 Fatty Acids (FISH OIL) 1000 MG CAPS Take 1,000 mg by mouth daily.   Yes Historical Provider, MD  polyethylene glycol (MIRALAX / GLYCOLAX) packet Take 17 g by mouth as needed (for constipation). Reported on 03/25/2016   Yes Historical Provider, MD  potassium chloride SA (K-DUR,KLOR-CON) 20 MEQ tablet  Take 2 tablets (40 mEq total) by mouth daily. 08/12/16  Yes Mihai Croitoru, MD  warfarin (COUMADIN) 5 MG tablet TAKE 1 TABLET BY MOUTH DAILY AS DIRECTED Patient taking differently: TAKE 1 TABLET BY MOUTH DAILY AS DIRECTED take one and a half on mondays and fridays 08/03/16  Yes [name], MD    Family History    Family History  Problem Relation Age of Onset  . Cancer Mother   . Stroke Father   . Cancer Sister   . Leukemia Sister   . Healthy Brother   . Healthy Sister   . Diabetes Sister   . Healthy Brother   . Healthy Brother   . Heart attack Maternal Grandfather   . Kidney disease Daughter   . Stroke Brother     Social History    Social History   Social History  . Marital status: Married    Spouse name: N/A  . Number of children: N/A  .  Years of education: N/A   Occupational History  . Not on file.   Social History Main Topics  . Smoking status: Never Smoker  . Smokeless tobacco: Never Used  . Alcohol use No  . Drug use: No  . Sexual activity: No   Other Topics Concern  . Not on file   Social History Narrative  . No narrative on file     Review of Systems    General:  No chills, fever, night sweats or weight changes.  Cardiovascular:  No chest pain, dyspnea on exertion, edema, orthopnea, palpitations, paroxysmal nocturnal dyspnea. Dermatological: No rash, lesions/masses Respiratory: No cough, dyspnea Urologic: No hematuria, dysuria Abdominal:   No nausea, vomiting, diarrhea, bright red blood per rectum, melena, or hematemesis Neurologic:  No visual changes, wkns, changes in mental status. All other systems reviewed and are otherwise negative except as noted above.  Physical Exam    Blood pressure 119/64, pulse (!) 57, resp. rate 14, height 5\' 1"  (1.549 m), weight 60.3 kg (133 lb), SpO2 99 %.  General: Pleasant, NAD Psych: Normal affect. Neuro: Alert and oriented X 3. Moves all extremities spontaneously. HEENT: Normal  Neck: Supple without  bruits or JVD. Lungs:  Resp regular and unlabored, CTA. Heart: RRR no s3, s4, or murmurs. Abdomen: Soft, non-tender, non-distended, BS + x 4.  Extremities: No clubbing, cyanosis or edema. DP/PT/Radials 2+ and equal bilaterally.  Labs    Troponin [facility] Test)  Recent Labs  09/29/16 2129  TROPIPOC 1.93*   No results for input(s): CKTOTAL, CKMB, TROPONINI in the last 72 hours. Lab Results  Component Value Date   WBC 6.3 09/29/2016   HGB 10.9 (L) 09/29/2016   HCT 32.6 (L) 09/29/2016   MCV 82.5 09/29/2016   PLT 241 09/29/2016    Recent Labs Lab 09/29/16 1756  NA 137  K 5.2*  CL 104  CO2 26  BUN 18  CREATININE 1.00  CALCIUM 10.2  GLUCOSE 119*   Lab Results  Component Value Date   CHOL 144 03/12/2016   HDL 49 03/12/2016   LDLCALC 88 03/12/2016   TRIG 35 03/12/2016   No results found for: [facility]   Radiology Studies    Dg Chest 2 View  Result Date: 09/29/2016 CLINICAL DATA:  Chest pain and shortness of breath. EXAM: CHEST  2 VIEW COMPARISON:  Chest radiograph March 11, 2016 FINDINGS: Cardiac silhouette is moderate to severely enlarged, unchanged. Status post median sternotomy for cardiac valve replacement. Calcified aortic knob. Single LEFT cardiac pacemaker in situ. No pleural effusion or focal consolidation. Similar bandlike density RIGHT lower lung zone. No pneumothorax. Soft tissue planes and included osseous structures are nonsuspicious. Mild degenerative change of the thoracic spine. IMPRESSION: Stable cardiomegaly.  RIGHT lung base atelectasis/ scarring. Electronically Signed   By: Elon Alas M.D.   On: 09/29/2016 18:59    ECG & Cardiac Imaging    Unchanged to one in 07/2016. Afib, no ischemia  Assessment & Plan    Mrs Bolling is 62 has a PMH of valvular diease, non obstructive CAD on recent LHC in 2017 for NSTEMI and Afib. She now presents with new NSTEMI. Given recent cath and repeat presentation this could be small vessel disease, not easily  identiofiable on cath.   Plan: -Admit to cardiology -Heparin drip -TFT -LHC in am -UP titration of antianginal meds. I held dilt given decreased LVEF. Consider higher BB dose and nitrates.  -Held warfarin given Hep treatment   Signed, Senetra Dillin,  MD 09/29/2016, 10:19 PM

## 2016-09-29 NOTE — ED Provider Notes (Signed)
[facility] DEPT Provider Note   CSN: [id number] Arrival date & time: 09/29/16  1730     History   Chief Complaint Chief Complaint  Patient presents with  . Chest Pain    HPI Carrie [name] is a 76 y.o. female.  The history is provided by the patient.  Chest Pain   This is a new problem. The current episode started 1 to 2 hours ago. The problem has been resolved. Associated with: nausea/vomiting. The pain is present in the substernal region. The pain is severe. The quality of the pain is described as pressure-like. The pain does not radiate. Duration of episode(s) is 30 minutes. Associated symptoms include nausea and vomiting (x3). Pertinent negatives include no abdominal pain, no cough, no diaphoresis, no exertional chest pressure, no fever, no headaches, no irregular heartbeat, no lower extremity edema, no near-syncope, no palpitations, no shortness of breath and no syncope. She has tried nothing (took ASA by EMS) for the symptoms.  Her past medical history is significant for arrhythmia, CAD, CHF, hyperlipidemia, hypertension, pacemaker, strokes and valve disorder.  Procedure history is positive for cardiac catheterization and echocardiogram.    Past Medical History:  Diagnosis Date  . Anticoagulated on Coumadin    for mech valve and atrial fib  goal 2.0-2.5  . Anxiety   . Arthritis    "right shoulder" (03/15/2013)  . Atrial fibrillation ([place])   . Atrial flutter ([place])   . CAD (coronary artery disease) 08/11/2016  . CHF (congestive heart failure) ([place])   . Chronic combined systolic and diastolic CHF (congestive heart failure) ([place])    a. 02/2016 Echo: EF 45-50%.  . Diverticula of colon   . Exertional shortness of breath   . GERD (gastroesophageal reflux disease)   . Heart murmur   . Hemorrhage intraabdominal 03/17/2012  . History of blood transfusion    "once" (03/15/2013)  . Hyperlipidemia   . Hypertension   . Liver hemorrhage   . Migraines   . 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 Bi-leaflet St. Jude mechanical prosthesis; b. 02/2016 Echo: EF 45-50%, some degree of MR, sev dil LA/RA, sev TR.  . NSTEMI (non-ST elevated myocardial infarction) ([place])    a. 02/2016 elev trop/Cath: nonobs dzs,   . Pacemaker    medtronic adapta  . Pneumonia 2009   resolved.? OPD Rx  . Severe tricuspid regurgitation    a. 02/2016 Echo: Ef 45-50%, sev TR, PASP 78mmHg.  . Sick sinus syndrome ([place])    Dr Cristopher Peru. EP study negative. Pacemaker 12/15/06 Medtronic  . Stroke [facility])    "they say I had a stroke last year" denies residual on 03/15/2013  . Subdural hematoma [facility])     Patient Active Problem List   Diagnosis Date Noted  . CAD (coronary artery disease) 08/11/2016  . PAH (pulmonary artery hypertension) 05/05/2016  . Anemia due to other cause   . AKI (acute kidney injury) ([place])   . Hyponatremia 03/12/2016  . Chest pain 03/11/2016  . NSTEMI (non-ST elevated myocardial infarction) ([place]) 03/11/2016  . 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 Thrombocytopenia ([place]) 12/03/2014  . SSS (sick sinus syndrome) ([place]) 07/24/2014  . Atrial fibrillation with slow ventricular response ([place]) 07/24/2014  . Pacemaker battery depletion 07/24/2014  . Chronic combined systolic and diastolic congestive heart failure ([place]) 07/09/2014  . Elective replacement indicated for pacemaker 07/09/2014  . Tachycardia, a fib with RVR  04/03/2013  . Essential hypertension 04/03/2013  . Normal coronary arteries  2004, normal LVF Jan 2013 03/21/2012  . SAH (subarachnoid hemorrhage), December 2012 03/21/2012  . Hemorrhage, hepatic, Rx'd with Coumadin reversal and embolisation 03/17/12 03/17/2012  . Chronic atrial fibrillation ([place]) 09/30/2011  . 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 Chronic anticoagulation, ( INR goal 2.0-2.5 due to history of subdural hematoma and liver hemorrhage) 09/09/2011  . Muscle weakness of lower extremity  09/08/2011  . Pacemaker - single chamber Medtronic Adapta, 2008 09/08/2011  . S/P mitral valve replacement, St Jude 09/06/2011  . CONSTIPATION 02/12/2009    Past Surgical History:  Procedure Laterality Date  . APPENDECTOMY    . CARDIAC CATHETERIZATION  04/02/97   R&L:severe MR/pulmonary hypertension  . CARDIAC CATHETERIZATION N/A 03/16/2016   Procedure: Left Heart Cath and Coronary Angiography;  Surgeon: [name], MD;  Location: [place] CV LAB;  Service: Cardiovascular;  Laterality: N/A;  . CATARACT EXTRACTION W/ INTRAOCULAR LENS  IMPLANT, BILATERAL Bilateral 2013  . DILATION AND CURETTAGE OF UTERUS     "had fibroids" (03/15/2013)  . EXPLORATORY LAPAROTOMY     "had a growth on my intestines" (03/15/2013)  . HAMMER TOE SURGERY Right   . INSERT / REPLACE / REMOVE PACEMAKER  12/15/2006   Medtronic  . MITRAL VALVE REPLACEMENT  1998   St Jude mechanical; Dr. [name]  . PACEMAKER PLACEMENT  12/15/06   medtronic adapta for SSS  . PERMANENT PACEMAKER GENERATOR CHANGE N/A 07/24/2014   Procedure: PERMANENT PACEMAKER GENERATOR CHANGE;  Surgeon: [name], MD;  Location: [place] CATH LAB;  Service: Cardiovascular;  Laterality: N/A;  . PERSANTINE CARDIOLITE  08/07/03   mild inf. ischemia   . TEE WITHOUT CARDIOVERSION  09/23/2011   Procedure: TRANSESOPHAGEAL ECHOCARDIOGRAM (TEE);  Surgeon: [name];  [facility];  Service: Cardiovascular;  Laterality: N/A;  . TONSILLECTOMY    . TUBAL LIGATION    . US ECHOCARDIOGRAPHY  11/19/2011   EF 50-55%,RA mod to severely dilated,LA severely dilated,trace MR,small vegetation or mass on the MV,AOV mildly scleroticmild PI, RV pressure 40-20mmHg    OB History    No data available       Home Medications    Prior to Admission medications   Medication Sig Start Date End Date Taking? Authorizing Provider  Biotin 1000 MCG tablet Take 1,000 mcg by mouth daily.    Historical Provider, MD  diltiazem (CARTIA XT) 120 MG 24 hr capsule  Take 1 capsule (120 mg total) by mouth daily. 08/11/16   Mihai Croitoru, MD  docusate sodium (COLACE) 100 MG capsule Take 200 mg by mouth daily.    Historical Provider, MD  furosemide (LASIX) 40 MG tablet Take 1 tablet (40 mg total) by mouth daily. 08/12/16   Mihai Croitoru, MD  metoprolol tartrate (LOPRESSOR) 25 MG tablet Take 0.5 tablets (12.5 mg total) by mouth 2 (two) times daily. 03/22/16   Florencia Reasons, MD  Multiple Vitamin (MULTIVITAMINS PO) Take 1 tablet by mouth daily.     Historical Provider, MD  Omega-3 Fatty Acids (FISH OIL) 1000 MG CAPS Take 1,000 mg by mouth daily.    Historical Provider, MD  polyethylene glycol (MIRALAX / GLYCOLAX) packet Take 17 g by mouth as needed (for constipation). Reported on 03/25/2016    Historical Provider, MD  potassium chloride SA (K-DUR,KLOR-CON) 20 MEQ tablet Take 2 tablets (40 mEq total) by mouth daily. 08/12/16   Mihai Croitoru, MD  warfarin (COUMADIN) 5 MG tablet TAKE 1 TABLET BY MOUTH DAILY AS DIRECTED 08/03/16   [name], MD  Family History Family History  Problem Relation Age of Onset  . Cancer Mother   . Stroke Father   . Cancer Sister   . Leukemia Sister   . Healthy Brother   . Healthy Sister   . Diabetes Sister   . Healthy Brother   . Healthy Brother   . Heart attack Maternal Grandfather   . Kidney disease Daughter   . Stroke Brother     Social History Social History  Substance Use Topics  . Smoking status: Never Smoker  . Smokeless tobacco: Never Used  . Alcohol use No     Allergies   Codeine   Review of Systems Review of Systems  Constitutional: Negative for diaphoresis and fever.  Respiratory: Negative for cough and shortness of breath.   Cardiovascular: Positive for chest pain. Negative for palpitations, leg swelling, syncope and near-syncope.  Gastrointestinal: Positive for nausea and vomiting (x3). Negative for abdominal distention, abdominal pain, blood in stool, constipation and diarrhea.  Neurological:  Negative for syncope, light-headedness and headaches.  All other systems reviewed and are negative.    Physical Exam Updated Vital Signs BP 151/71 (BP Location: Right Arm)   Resp 24   Ht 5\' 1"  (1.549 m)   Wt 60.3 kg   SpO2 100%   BMI 25.13 kg/m   Physical Exam  Constitutional: She appears well-developed and well-nourished. No distress.  HENT:  Head: Normocephalic and atraumatic.  Mouth/Throat: Oropharynx is clear and moist.  Eyes: Conjunctivae are normal. No scleral icterus.  Neck: Neck supple.  Cardiovascular: Normal rate and intact distal pulses.  An irregularly irregular rhythm present. Exam reveals no friction rub.   Murmur heard.  Systolic murmur is present with a grade of 2/6  Pulmonary/Chest: Effort normal and breath sounds normal. No respiratory distress. She has no wheezes. She has no rales.  Abdominal: Soft. There is no tenderness.  Musculoskeletal: She exhibits no edema.  Neurological: She is alert.  Skin: Skin is warm and dry.  Psychiatric: She has a normal mood and affect.  Nursing note and vitals reviewed.    ED Treatments / Results  Labs (all labs ordered are listed, but only abnormal results are displayed) Labs Reviewed  CBC - Abnormal; Notable for the following:       Result Value   Hemoglobin 10.9 (*)    HCT 32.6 (*)    RDW 20.3 (*)    All other components within normal limits  BASIC METABOLIC PANEL - Abnormal; Notable for the following:    Potassium 5.2 (*)    Glucose, Bld 119 (*)    GFR calc non Af Amer 54 (*)    All other components within normal limits  Alphonzo Lemmings, ED    EKG  EKG Interpretation  Date/Time:  Tuesday September 29 2016 17:32:21 EST Ventricular Rate:  61 PR Interval:    QRS Duration: 152 QT Interval:  447 QTC Calculation: 451 R Axis:   -80 Text Interpretation:  Atrial fibrillation Left bundle branch block Artifact in lead(s) I II aVR aVL aVF Confirmed by DELO  MD, DOUGLAS ([id number]) on 09/29/2016 6:14:41  PM       Radiology No results found.  Procedures Procedures (including critical care time)  Medications Ordered in ED Medications - No data to display   Initial Impression / Assessment and Plan / ED Course  I have reviewed the triage vital signs and the nursing notes.  Pertinent labs & imaging results that were available during my care of  the patient were reviewed by me and considered in my medical decision making (see chart for details).  Clinical Course    Patient is a 76 year old female with history of mild CAD, CHF, A. fib with pacemaker, mitral regurgitation status post mitral valve replacement, stroke who presents with episode of chest pain.  Patient states she had eaten 60-day-old coleslaw from [facility] and few hours later was sitting on the toilet when she began having the chest pain. This was immediately followed by nausea and 3 episodes of vomiting. After the vomiting the chest pain resolved. Patient states pain was in the middle of her chest and felt like pressure. Patient denies any diarrhea or abdominal pain. Patient currently chest pain-free and has no other symptoms at this time.  Given the patient had chest pain after eating suspect food followed by multiple episodes of vomiting her chest pain is likely GI source. EKG shows A. fib with a old left bundle branch block. No new ischemic changes. Plan for delta troponin and if both negative patient should be safe for discharge. If patient has any further episodes of chest pain plan for admission. Doubt pulmonary embolus, aortic dissection, pneumonia, pneumothorax. Chest x-ray ordered which showed no acute findings. Of note patient had left heart cath performed June 2017 that showed no more than 25% stenosis of any coronary arteries.  First troponin was negative. Second troponin was positive at 1.93. Patient appears to have suffered another NSTEMI. Heparin gtt started. Consult to cardiology for admission. Pt received ASA prior to  arrival. Pt has had mild chest discomfort intermittently per patient. Will place nitro patch.  Pt seen with attending Dr. Stark Jock.   Final Clinical Impressions(s) / ED Diagnoses   Final diagnoses:  NSTEMI (non-ST elevated myocardial infarction) [facility])  Chest pain, unspecified type    New Prescriptions New Prescriptions   No medications on file     Tobie Poet, DO 09/29/16 2151    Veryl Speak, MD 09/29/16 2329

## 2016-09-30 ENCOUNTER — Encounter (HOSPITAL_COMMUNITY)
Admission: EM | Disposition: A | Discharge status: Home Health | Payer: Self-pay | Source: Home / Self Care | Attending: Cardiovascular Disease

## 2016-09-30 DIAGNOSIS — E7800 Pure hypercholesterolemia, unspecified: Secondary | ICD-10-CM

## 2016-09-30 DIAGNOSIS — I214 Non-ST elevation (NSTEMI) myocardial infarction: Principal | ICD-10-CM

## 2016-09-30 DIAGNOSIS — I481 Persistent atrial fibrillation: Secondary | ICD-10-CM

## 2016-09-30 DIAGNOSIS — I110 Hypertensive heart disease with heart failure: Secondary | ICD-10-CM

## 2016-09-30 DIAGNOSIS — I2510 Atherosclerotic heart disease of native coronary artery without angina pectoris: Secondary | ICD-10-CM

## 2016-09-30 HISTORY — PX: CARDIAC CATHETERIZATION: SHX172

## 2016-09-30 LAB — HEPARIN LEVEL (UNFRACTIONATED)
HEPARIN UNFRACTIONATED: 0.53 [IU]/mL (ref 0.30–0.70)
Heparin Unfractionated: 0.3 IU/mL (ref 0.30–0.70)

## 2016-09-30 LAB — BASIC METABOLIC PANEL
ANION GAP: 8 (ref 5–15)
BUN: 13 mg/dL (ref 6–20)
CHLORIDE: 106 mmol/L (ref 101–111)
CO2: 23 mmol/L (ref 22–32)
Calcium: 9.6 mg/dL (ref 8.9–10.3)
Creatinine, Ser: 0.78 mg/dL (ref 0.44–1.00)
GFR calc non Af Amer: >60 mL/min (ref >60)
Glucose, Bld: 96 mg/dL (ref 65–99)
POTASSIUM: 4 mmol/L (ref 3.5–5.1)
Sodium: 137 mmol/L (ref 135–145)

## 2016-09-30 LAB — CBC
HEMATOCRIT: 28.2 % — AB (ref 36.0–46.0)
Hemoglobin: 9.4 g/dL — ABNORMAL LOW (ref 12.0–15.0)
MCH: 27.2 pg (ref 26.0–34.0)
MCHC: 33.3 g/dL (ref 30.0–36.0)
MCV: 81.7 fL (ref 78.0–100.0)
PLATELETS: 182 10*3/uL (ref 150–400)
RBC: 3.45 MIL/uL — ABNORMAL LOW (ref 3.87–5.11)
RDW: 20.3 % — AB (ref 11.5–15.5)
WBC: 6.1 10*3/uL (ref 4.0–10.5)

## 2016-09-30 LAB — TSH: TSH: 1.666 u[IU]/mL (ref 0.350–4.500)

## 2016-09-30 LAB — PROTIME-INR
INR: 1.81
PROTHROMBIN TIME: 21.2 s — AB (ref 11.4–15.2)

## 2016-09-30 LAB — T4, FREE: Free T4: 1.09 ng/dL (ref 0.61–1.12)

## 2016-09-30 SURGERY — LEFT HEART CATH AND CORONARY ANGIOGRAPHY

## 2016-09-30 MED ORDER — MIDAZOLAM HCL 2 MG/2ML IJ SOLN
INTRAMUSCULAR | Status: AC
  Filled 2016-09-30: qty 2

## 2016-09-30 MED ORDER — FENTANYL CITRATE (PF) 100 MCG/2ML IJ SOLN
INTRAMUSCULAR | Status: DC | PRN
  Administered 2016-09-30 (×2): 25 ug via INTRAVENOUS

## 2016-09-30 MED ORDER — MIDAZOLAM HCL 2 MG/2ML IJ SOLN
INTRAMUSCULAR | Status: DC | PRN
  Administered 2016-09-30: 1 mg via INTRAVENOUS

## 2016-09-30 MED ORDER — SODIUM CHLORIDE 0.9 % IV SOLN: 250.0000 mL | INTRAVENOUS | Status: DC | PRN

## 2016-09-30 MED ORDER — SODIUM CHLORIDE 0.9 % WEIGHT BASED INFUSION
3.0000 mL/kg/h | INTRAVENOUS | Status: DC
  Administered 2016-09-30: 3 mL/kg/h via INTRAVENOUS

## 2016-09-30 MED ORDER — LIDOCAINE HCL (PF) 1 % IJ SOLN
INTRAMUSCULAR | Status: DC | PRN
  Administered 2016-09-30: 2 mL

## 2016-09-30 MED ORDER — ONDANSETRON HCL 4 MG/2ML IJ SOLN: 4.0000 mg | Freq: Four times a day (QID) | INTRAMUSCULAR | Status: DC | PRN

## 2016-09-30 MED ORDER — HEART ATTACK BOUNCING BOOK
Freq: Once | Status: AC
  Administered 2016-09-30: 21:00:00
  Filled 2016-09-30: qty 1

## 2016-09-30 MED ORDER — VERAPAMIL HCL 2.5 MG/ML IV SOLN
INTRAVENOUS | Status: DC | PRN
  Administered 2016-09-30: 16:00:00 via INTRA_ARTERIAL

## 2016-09-30 MED ORDER — LABETALOL HCL 5 MG/ML IV SOLN: 10.0000 mg | INTRAVENOUS | Status: AC | PRN

## 2016-09-30 MED ORDER — ACETAMINOPHEN 325 MG PO TABS: 650.0000 mg | ORAL_TABLET | ORAL | Status: DC | PRN

## 2016-09-30 MED ORDER — FUROSEMIDE 40 MG PO TABS
40.0000 mg | ORAL_TABLET | Freq: Every day | ORAL | Status: DC
  Administered 2016-09-30 – 2016-10-02 (×3): 40 mg via ORAL
  Filled 2016-09-30 (×3): qty 1

## 2016-09-30 MED ORDER — CLOPIDOGREL BISULFATE 75 MG PO TABS
75.0000 mg | ORAL_TABLET | Freq: Every day | ORAL | Status: DC
  Administered 2016-10-01 – 2016-10-08 (×8): 75 mg via ORAL
  Filled 2016-09-30 (×8): qty 1

## 2016-09-30 MED ORDER — POLYETHYLENE GLYCOL 3350 17 G PO PACK: 17.0000 g | PACK | ORAL | Status: DC | PRN

## 2016-09-30 MED ORDER — HEPARIN (PORCINE) IN NACL 2-0.9 UNIT/ML-% IJ SOLN
INTRAMUSCULAR | Status: DC | PRN
  Administered 2016-09-30: 1000 mL

## 2016-09-30 MED ORDER — SODIUM CHLORIDE 0.9 % WEIGHT BASED INFUSION: 1.0000 mL/kg/h | INTRAVENOUS | Status: DC

## 2016-09-30 MED ORDER — POTASSIUM CHLORIDE CRYS ER 20 MEQ PO TBCR
40.0000 meq | EXTENDED_RELEASE_TABLET | Freq: Every day | ORAL | Status: DC
  Administered 2016-09-30 – 2016-10-07 (×8): 40 meq via ORAL
  Filled 2016-09-30 (×8): qty 2

## 2016-09-30 MED ORDER — SODIUM CHLORIDE 0.9% FLUSH: 3.0000 mL | INTRAVENOUS | Status: DC | PRN

## 2016-09-30 MED ORDER — IOPAMIDOL (ISOVUE-370) INJECTION 76%
INTRAVENOUS | Status: AC
  Filled 2016-09-30: qty 100

## 2016-09-30 MED ORDER — HEPARIN SODIUM (PORCINE) 1000 UNIT/ML IJ SOLN
INTRAMUSCULAR | Status: DC | PRN
  Administered 2016-09-30 (×2): 3000 [IU] via INTRAVENOUS

## 2016-09-30 MED ORDER — WARFARIN - PHARMACIST DOSING INPATIENT: Freq: Every day | Status: DC

## 2016-09-30 MED ORDER — NITROGLYCERIN 0.4 MG SL SUBL: 0.4000 mg | SUBLINGUAL_TABLET | SUBLINGUAL | Status: DC | PRN

## 2016-09-30 MED ORDER — METOPROLOL TARTRATE 12.5 MG HALF TABLET
12.5000 mg | ORAL_TABLET | Freq: Two times a day (BID) | ORAL | Status: DC
  Administered 2016-09-30 – 2016-10-01 (×3): 12.5 mg via ORAL
  Filled 2016-09-30 (×3): qty 1

## 2016-09-30 MED ORDER — IOPAMIDOL (ISOVUE-370) INJECTION 76%
INTRAVENOUS | Status: AC
  Filled 2016-09-30: qty 50

## 2016-09-30 MED ORDER — SODIUM CHLORIDE 0.9 % IV SOLN
250.0000 mL | INTRAVENOUS | Status: DC | PRN
  Administered 2016-10-07: 500 mL via INTRAVENOUS

## 2016-09-30 MED ORDER — HEPARIN (PORCINE) IN NACL 100-0.45 UNIT/ML-% IJ SOLN
800.0000 [IU]/h | INTRAMUSCULAR | Status: DC
  Administered 2016-10-01: 01:00:00 750 [IU]/h via INTRAVENOUS
  Administered 2016-10-02: 850 [IU]/h via INTRAVENOUS
  Administered 2016-10-03 – 2016-10-06 (×2): 800 [IU]/h via INTRAVENOUS
  Filled 2016-09-30 (×6): qty 250

## 2016-09-30 MED ORDER — SODIUM CHLORIDE 0.9 % WEIGHT BASED INFUSION: 1.0000 mL/kg/h | INTRAVENOUS | Status: AC

## 2016-09-30 MED ORDER — LIDOCAINE HCL (PF) 1 % IJ SOLN
INTRAMUSCULAR | Status: AC
  Filled 2016-09-30: qty 30

## 2016-09-30 MED ORDER — HYDRALAZINE HCL 20 MG/ML IJ SOLN: 5.0000 mg | INTRAMUSCULAR | Status: AC | PRN

## 2016-09-30 MED ORDER — ATORVASTATIN CALCIUM 40 MG PO TABS
40.0000 mg | ORAL_TABLET | Freq: Every day | ORAL | Status: DC
Start: 2016-09-30 — End: 2016-10-08
  Administered 2016-09-30 – 2016-10-07 (×8): 40 mg via ORAL
  Filled 2016-09-30 (×8): qty 1

## 2016-09-30 MED ORDER — FENTANYL CITRATE (PF) 100 MCG/2ML IJ SOLN
INTRAMUSCULAR | Status: AC
  Filled 2016-09-30: qty 2

## 2016-09-30 MED ORDER — ASPIRIN 300 MG RE SUPP: 300.0000 mg | RECTAL | Status: DC

## 2016-09-30 MED ORDER — CLOPIDOGREL BISULFATE 300 MG PO TABS
ORAL_TABLET | ORAL | Status: DC | PRN
  Administered 2016-09-30: 600 mg via ORAL

## 2016-09-30 MED ORDER — SODIUM CHLORIDE 0.9% FLUSH: 3.0000 mL | Freq: Two times a day (BID) | INTRAVENOUS | Status: DC

## 2016-09-30 MED ORDER — SODIUM CHLORIDE 0.9% FLUSH
3.0000 mL | Freq: Two times a day (BID) | INTRAVENOUS | Status: DC
  Administered 2016-10-02 – 2016-10-07 (×4): 3 mL via INTRAVENOUS

## 2016-09-30 MED ORDER — VERAPAMIL HCL 2.5 MG/ML IV SOLN
INTRAVENOUS | Status: AC
  Filled 2016-09-30: qty 2

## 2016-09-30 MED ORDER — WARFARIN SODIUM 6 MG PO TABS
6.0000 mg | ORAL_TABLET | ORAL | Status: AC
  Administered 2016-09-30: 6 mg via ORAL
  Filled 2016-09-30: qty 1

## 2016-09-30 MED ORDER — BIOTIN 1000 MCG PO TABS: 1000.0000 ug | ORAL_TABLET | Freq: Every day | ORAL | Status: DC

## 2016-09-30 MED ORDER — ASPIRIN 81 MG PO CHEW: 324.0000 mg | CHEWABLE_TABLET | ORAL | Status: DC

## 2016-09-30 MED ORDER — CLOPIDOGREL BISULFATE 300 MG PO TABS
ORAL_TABLET | ORAL | Status: AC
  Filled 2016-09-30: qty 1

## 2016-09-30 MED ORDER — ANGIOPLASTY BOOK
Freq: Once | Status: AC
  Administered 2016-09-30: 21:00:00
  Filled 2016-09-30: qty 1

## 2016-09-30 MED ORDER — ASPIRIN EC 81 MG PO TBEC
81.0000 mg | DELAYED_RELEASE_TABLET | Freq: Every day | ORAL | Status: DC
  Administered 2016-09-30 – 2016-10-07 (×8): 81 mg via ORAL
  Filled 2016-09-30 (×8): qty 1

## 2016-09-30 MED ORDER — DOCUSATE SODIUM 100 MG PO CAPS
200.0000 mg | ORAL_CAPSULE | Freq: Three times a day (TID) | ORAL | Status: DC
  Administered 2016-09-30 – 2016-10-08 (×24): 200 mg via ORAL
  Filled 2016-09-30 (×25): qty 2

## 2016-09-30 MED ORDER — ASPIRIN 81 MG PO CHEW: 81.0000 mg | CHEWABLE_TABLET | ORAL | Status: AC

## 2016-09-30 SURGICAL SUPPLY — 23 items
BALLN EMERGE MR 2.5X12 (BALLOONS) ×2
BALLN ~~LOC~~ EUPHORA RX 4.0X12 (BALLOONS) ×2
BALLOON EMERGE MR 2.5X12 (BALLOONS) IMPLANT
BALLOON ~~LOC~~ EUPHORA RX 4.0X12 (BALLOONS) IMPLANT
CATH EXPO 5F FL3.5 (CATHETERS) ×1 IMPLANT
CATH EXPO 5FR ANG PIGTAIL 145 (CATHETERS) ×1 IMPLANT
CATH EXPO 5FR FR4 (CATHETERS) ×1 IMPLANT
CATH OPTITORQUE JACKY 4.0 5F (CATHETERS) ×1 IMPLANT
CATH VISTA GUIDE 6FR JR4 SH (CATHETERS) ×1 IMPLANT
DEVICE RAD COMP TR BAND LRG (VASCULAR PRODUCTS) ×1 IMPLANT
GLIDESHEATH SLEND SS 6F .021 (SHEATH) ×1 IMPLANT
GUIDEWIRE INQWIRE 1.5J.035X260 (WIRE) IMPLANT
INQWIRE 1.5J .035X260CM (WIRE) ×2
KIT ENCORE 26 ADVANTAGE (KITS) ×1 IMPLANT
KIT HEART LEFT (KITS) ×2 IMPLANT
PACK CARDIAC CATHETERIZATION (CUSTOM PROCEDURE TRAY) ×2 IMPLANT
STENT RESOLUTE ONYX 3.5X12 (Permanent Stent) ×1 IMPLANT
SYR MEDRAD MARK V 150ML (SYRINGE) ×2 IMPLANT
TRANSDUCER W/MONITORING KIT (MISCELLANEOUS) IMPLANT
TRANSDUCER W/STOPCOCK (MISCELLANEOUS) ×2 IMPLANT
TUBING CIL FLEX 10 FLL-RA (TUBING) ×2 IMPLANT
WIRE HI TORQ VERSACORE-J 145CM (WIRE) ×1 IMPLANT
WIRE RUNTHROUGH .014X180CM (WIRE) ×1 IMPLANT

## 2016-09-30 NOTE — Progress Notes (Signed)
ANTICOAGULATION CONSULT NOTE - Follow Up Consult  Pharmacy Consult for Heparin  Indication: chest pain/ACS  Allergies  Allergen Reactions  . Codeine Nausea And Vomiting   Patient Measurements: Height: 5\' 1"  (154.9 cm) Weight: 135 lb 5.8 oz (61.4 kg) IBW/kg (Calculated) : 47.8  Vital Signs: Temp: 98 F (36.7 C) (01/10 0514) Temp Source: Oral (01/10 0514) BP: 108/52 (01/10 0514) Pulse Rate: 88 (01/10 0514)  Labs:  Recent Labs  09/29/16 1756 09/29/16 1837 09/30/16 0542  HGB 10.9*  --  9.4*  HCT 32.6*  --  28.2*  PLT 241  --  PENDING  LABPROT  --  20.3* 21.2*  INR  --  1.71 1.81  HEPARINUNFRC  --   --  0.30  CREATININE 1.00  --  0.78    Estimated Creatinine Clearance: 51.8 mL/min (by C-G formula based on SCr of 0.78 mg/dL).    Assessment: Heparin for CP, initial heparin level on low end of therapeutic range, poss cath today  Goal of Therapy:  Heparin level 0.3-0.7 units/ml Monitor platelets by anticoagulation protocol: Yes   Plan:  -Inc heparin to 750 units/hr -1300 HL  Jahbari Repinski 09/30/2016,6:55 AM

## 2016-09-30 NOTE — Progress Notes (Signed)
Patient Name: Carrie [name] Date of Encounter: 09/30/2016  Primary Cardiologist: Dr, [facility] Problem List     Active Problems:   Chest pain     Subjective   No further chest pain since admission.   Inpatient Medications    Scheduled Meds: . aspirin EC  81 mg Oral Daily  . atorvastatin  40 mg Oral q1800  . docusate sodium  200 mg Oral TID  . furosemide  40 mg Oral Daily  . metoprolol tartrate  12.5 mg Oral BID  . potassium chloride SA  40 mEq Oral Daily   Continuous Infusions: . heparin 750 Units/hr (09/30/16 0715)   PRN Meds: acetaminophen, nitroGLYCERIN, ondansetron (ZOFRAN) IV, polyethylene glycol   Vital Signs    Vitals:   09/30/16 0055 09/30/16 0059 09/30/16 0514 09/30/16 0943  BP:  (!) 148/72 (!) 108/52 129/70  Pulse:  77 88 65  Resp:  18 18   Temp:  97.7 F (36.5 C) 98 F (36.7 C)   TempSrc:  Oral Oral   SpO2:  97% 96%   Weight: 135 lb 5.8 oz (61.4 kg)     Height: 5\' 1"  (1.549 m)       Intake/Output Summary (Last 24 hours) at 09/30/16 1052 Last data filed at 09/30/16 0800  Gross per 24 hour  Intake               70 ml  Output              650 ml  Net             -580 ml   Filed Weights   09/29/16 1738 09/30/16 0055  Weight: 133 lb (60.3 kg) 135 lb 5.8 oz (61.4 kg)    Physical Exam    GEN: Well nourished, well developed, older AA female in no acute distress.  HEENT: Grossly normal.  Neck: Supple, + JVD, carotid bruits, or masses. Cardiac: Irreg Irreg, prosthetic valve click, no murmur, rubs, or gallops. No clubbing, cyanosis, trace bilateral LE edema.  Radials/DP/PT 2+ and equal bilaterally.  Respiratory:  Respirations regular and unlabored, clear to auscultation bilaterally. GI: Soft, nontender, nondistended, BS + x 4. MS: no deformity or atrophy. Skin: warm and dry, no rash. Neuro:  Strength and sensation are intact. Psych: AAOx3.  Normal affect.  Labs    CBC  Recent Labs  09/29/16 1756 09/30/16 0542  WBC 6.3 6.1    HGB 10.9* 9.4*  HCT 32.6* 28.2*  MCV 82.5 81.7  PLT 241 [id number]   Basic Metabolic Panel  Recent Labs  09/29/16 1756 09/30/16 0542  NA 137 137  K 5.2* 4.0  CL 104 106  CO2 26 23  GLUCOSE 119* 96  BUN 18 13  CREATININE 1.00 0.78  CALCIUM 10.2 9.6   Liver Function Tests No results for input(s): AST, ALT, ALKPHOS, BILITOT, PROT, ALBUMIN in the last 72 hours. No results for input(s): LIPASE, AMYLASE in the last 72 hours. Cardiac Enzymes No results for input(s): CKTOTAL, CKMB, CKMBINDEX, TROPONINI in the last 72 hours. BNP Invalid input(s): POCBNP D-Dimer No results for input(s): DDIMER in the last 72 hours. Hemoglobin A1C No results for input(s): HGBA1C in the last 72 hours. Fasting Lipid Panel No results for input(s): CHOL, HDL, LDLCALC, TRIG, CHOLHDL, LDLDIRECT in the last 72 hours. Thyroid Function Tests  Recent Labs  09/30/16 0121  TSH 1.666    Telemetry    AF with intermittent vpacing - Personally Reviewed  ECG  AF vs Atrial Flutter, left bundle branch block- Personally Reviewed  Radiology    Dg Chest 2 View  Result Date: 09/29/2016 CLINICAL DATA:  Chest pain and shortness of breath. EXAM: CHEST  2 VIEW COMPARISON:  Chest radiograph March 11, 2016 FINDINGS: Cardiac silhouette is moderate to severely enlarged, unchanged. Status post median sternotomy for cardiac valve replacement. Calcified aortic knob. Single LEFT cardiac pacemaker in situ. No pleural effusion or focal consolidation. Similar bandlike density RIGHT lower lung zone. No pneumothorax. Soft tissue planes and included osseous structures are nonsuspicious. Mild degenerative change of the thoracic spine. IMPRESSION: Stable cardiomegaly.  RIGHT lung base atelectasis/ scarring. Electronically Signed   By: Elon Alas M.D.   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 Trop peaked at 5, planned for [employer].   Assessment & Plan    1. NSTEMI: Present with chest pain, n/v that started yesterday while she was having a BM. Chest pain came first, then became nauseated and vomited. Reports this was similar to the pain she had back in 6/17 when she underwent cath. Cath at that time showed non-obstructive CAD. Was given nitro with improvement in symptoms.  -- on coumadin for AF but INR was 1.8, therefore was held and started on IV heparin. -- Given reported symptoms, and rise in trop will plan for LHC today. The patient understands that risks included but are not limited to stroke (1 in 1000), death (1 in 21), kidney failure [usually temporary] (1 in 500), bleeding (1 in 200), allergic reaction [possibly serious] (1 in 200).  -- Symptoms could be related to microvascular disease or vasospasm. Not currently on any antianginal therapy as outpatient.  2. AF: On diltiazem 120mg  daily at home for rate control. This was held on admission given her mildly reduced EF and started on metoprolol. Telemetry does show episodes of tachy rhythm, NSVT?, intermittent vpacing noted. Rate is generally controlled.  -- coumadin held in anticipation for LHC, on IV heparin  3. Combined HF: Does not appear to be volume overloaded on exam. Last Echo showed EF of 45-50%.   4. Valvular disease: s/p MVR, known moderate/severe TR. Appears to be volume stable on exam. MVR stable on last echo.   5. HTN: slightly hypertensive on admission, but improved today.   Signed, Reino Bellis, NP  09/30/2016, 10:52 AM

## 2016-09-30 NOTE — H&P (View-Only) (Signed)
Patient Name: Carrie [name] Date of Encounter: 09/30/2016  Primary Cardiologist: Dr, [facility] Problem List     Active Problems:   Chest pain     Subjective   No further chest pain since admission.   Inpatient Medications    Scheduled Meds: . aspirin EC  81 mg Oral Daily  . atorvastatin  40 mg Oral q1800  . docusate sodium  200 mg Oral TID  . furosemide  40 mg Oral Daily  . metoprolol tartrate  12.5 mg Oral BID  . potassium chloride SA  40 mEq Oral Daily   Continuous Infusions: . heparin 750 Units/hr (09/30/16 0715)   PRN Meds: acetaminophen, nitroGLYCERIN, ondansetron (ZOFRAN) IV, polyethylene glycol   Vital Signs    Vitals:   09/30/16 0055 09/30/16 0059 09/30/16 0514 09/30/16 0943  BP:  (!) 148/72 (!) 108/52 129/70  Pulse:  77 88 65  Resp:  18 18   Temp:  97.7 F (36.5 C) 98 F (36.7 C)   TempSrc:  Oral Oral   SpO2:  97% 96%   Weight: 135 lb 5.8 oz (61.4 kg)     Height: 5\' 1"  (1.549 m)       Intake/Output Summary (Last 24 hours) at 09/30/16 1052 Last data filed at 09/30/16 0800  Gross per 24 hour  Intake               70 ml  Output              650 ml  Net             -580 ml   Filed Weights   09/29/16 1738 09/30/16 0055  Weight: 133 lb (60.3 kg) 135 lb 5.8 oz (61.4 kg)    Physical Exam    GEN: Well nourished, well developed, older AA female in no acute distress.  HEENT: Grossly normal.  Neck: Supple, + JVD, carotid bruits, or masses. Cardiac: Irreg Irreg, prosthetic valve click, no murmur, rubs, or gallops. No clubbing, cyanosis, trace bilateral LE edema.  Radials/DP/PT 2+ and equal bilaterally.  Respiratory:  Respirations regular and unlabored, clear to auscultation bilaterally. GI: Soft, nontender, nondistended, BS + x 4. MS: no deformity or atrophy. Skin: warm and dry, no rash. Neuro:  Strength and sensation are intact. Psych: AAOx3.  Normal affect.  Labs    CBC  Recent Labs  09/29/16 1756 09/30/16 0542  WBC 6.3 6.1    HGB 10.9* 9.4*  HCT 32.6* 28.2*  MCV 82.5 81.7  PLT 241 [id number]   Basic Metabolic Panel  Recent Labs  09/29/16 1756 09/30/16 0542  NA 137 137  K 5.2* 4.0  CL 104 106  CO2 26 23  GLUCOSE 119* 96  BUN 18 13  CREATININE 1.00 0.78  CALCIUM 10.2 9.6   Liver Function Tests No results for input(s): AST, ALT, ALKPHOS, BILITOT, PROT, ALBUMIN in the last 72 hours. No results for input(s): LIPASE, AMYLASE in the last 72 hours. Cardiac Enzymes No results for input(s): CKTOTAL, CKMB, CKMBINDEX, TROPONINI in the last 72 hours. BNP Invalid input(s): POCBNP D-Dimer No results for input(s): DDIMER in the last 72 hours. Hemoglobin A1C No results for input(s): HGBA1C in the last 72 hours. Fasting Lipid Panel No results for input(s): CHOL, HDL, LDLCALC, TRIG, CHOLHDL, LDLDIRECT in the last 72 hours. Thyroid Function Tests  Recent Labs  09/30/16 0121  TSH 1.666    Telemetry    AF with intermittent vpacing - Personally Reviewed  ECG  AF vs Atrial Flutter, left bundle branch block- Personally Reviewed  Radiology    Dg Chest 2 View  Result Date: 09/29/2016 CLINICAL DATA:  Chest pain and shortness of breath. EXAM: CHEST  2 VIEW COMPARISON:  Chest radiograph March 11, 2016 FINDINGS: Cardiac silhouette is moderate to severely enlarged, unchanged. Status post median sternotomy for cardiac valve replacement. Calcified aortic knob. Single LEFT cardiac pacemaker in situ. No pleural effusion or focal consolidation. Similar bandlike density RIGHT lower lung zone. No pneumothorax. Soft tissue planes and included osseous structures are nonsuspicious. Mild degenerative change of the thoracic spine. IMPRESSION: Stable cardiomegaly.  RIGHT lung base atelectasis/ scarring. Electronically Signed   By: Elon Alas M.D.   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 Trop peaked at 5, planned for [facility].   Assessment & Plan    1. NSTEMI: Present with chest pain, n/v that started yesterday while she was having a BM. Chest pain came first, then became nauseated and vomited. Reports this was similar to the pain she had back in 6/17 when she underwent cath. Cath at that time showed non-obstructive CAD. Was given nitro with improvement in symptoms.  -- on coumadin for AF but INR was 1.8, therefore was held and started on IV heparin. -- Given reported symptoms, and rise in trop will plan for LHC today. The patient understands that risks included but are not limited to stroke (1 in 1000), death (1 in 29), kidney failure [usually temporary] (1 in 500), bleeding (1 in 200), allergic reaction [possibly serious] (1 in 200).  -- Symptoms could be related to microvascular disease or vasospasm. Not currently on any antianginal therapy as outpatient.  2. AF: On diltiazem 120mg  daily at home for rate control. This was held on admission given her mildly reduced EF and started on metoprolol. Telemetry does show episodes of tachy rhythm, NSVT?, intermittent vpacing noted. Rate is generally controlled.  -- coumadin held in anticipation for LHC, on IV heparin  3. Combined HF: Does not appear to be volume overloaded on exam. Last Echo showed EF of 45-50%.   4. Valvular disease: s/p MVR, known moderate/severe TR. Appears to be volume stable on exam. MVR stable on last echo.   5. HTN: slightly hypertensive on admission, but improved today.   Signed, Reino Bellis, NP  09/30/2016, 10:52 AM

## 2016-09-30 NOTE — Care Management Note (Signed)
Case Management Note  Patient Details  Name: Carrie [name] MRN: [id number] Date of Birth: 09-Jun-1941  Subjective/Objective:   S/p coronary stent intervention, will be on plavix , NCM will cont to follow for dc needs.                 Action/Plan:   Expected Discharge Date:                  Expected Discharge Plan:  Home/Self Care  In-House Referral:     Discharge planning Services  CM Consult  Post Acute Care Choice:    Choice offered to:     DME Arranged:    DME Agency:     HH Arranged:    HH Agency:     Status of Service:  Completed, signed off  If discussed at [employer] of Stay Meetings, dates discussed:    Additional Comments:  Zenon Mayo, RN 09/30/2016, 10:28 PM

## 2016-09-30 NOTE — Progress Notes (Addendum)
ANTICOAGULATION CONSULT NOTE - Initial Consult  Pharmacy Consult:  Heparin / Coumadin Indication: atrial fibrillation  Allergies  Allergen Reactions  . Codeine Nausea And Vomiting    Patient Measurements: Height: 5\' 1"  (154.9 cm) Weight: 135 lb 5.8 oz (61.4 kg) IBW/kg (Calculated) : 47.8 Heparin Dosing Weight: 52 kg  Vital Signs: Temp: 98.2 F (36.8 C) (01/10 1323) Temp Source: Oral (01/10 1323) BP: 143/80 (01/10 1653) Pulse Rate: 82 (01/10 1653)  Labs:  Recent Labs  09/29/16 1756 09/29/16 1837 09/30/16 0542 09/30/16 1345  HGB 10.9*  --  9.4*  --   HCT 32.6*  --  28.2*  --   PLT 241  --  182  --   LABPROT  --  20.3* 21.2*  --   INR  --  1.71 1.81  --   HEPARINUNFRC  --   --  0.30 0.53  CREATININE 1.00  --  0.78  --     Estimated Creatinine Clearance: 51.8 mL/min (by C-G formula based on SCr of 0.78 mg/dL).   Medical History: Past Medical History:  Diagnosis Date  . Anticoagulated on Coumadin    for mech valve and atrial fib  goal 2.0-2.5  . Anxiety   . Arthritis    "right shoulder" (03/15/2013)  . Atrial fibrillation ([place])   . Atrial flutter ([place])   . CAD (coronary artery disease) 08/11/2016  . CHF (congestive heart failure) ([place])   . Chronic combined systolic and diastolic CHF (congestive heart failure) ([place])    a. 02/2016 Echo: EF 45-50%.  . Diverticula of colon   . Exertional shortness of breath   . GERD (gastroesophageal reflux disease)   . Heart murmur   . Hemorrhage intraabdominal 03/17/2012  . History of blood transfusion    "once" (03/15/2013)  . Hyperlipidemia   . Hypertension   . Liver hemorrhage   . Migraines   . 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 Bi-leaflet St. Jude mechanical prosthesis; b. 02/2016 Echo: EF 45-50%, some degree of MR, sev dil LA/RA, sev TR.  . NSTEMI (non-ST elevated myocardial infarction) ([place])    a. 02/2016 elev trop/Cath: nonobs dzs,   . Pacemaker    medtronic adapta  . Pneumonia 2009   resolved.?  OPD Rx  . Severe tricuspid regurgitation    a. 02/2016 Echo: Ef 45-50%, sev TR, PASP 43mmHg.  . Sick sinus syndrome ([place])    Dr Cristopher Peru. EP study negative. Pacemaker 12/15/06 Medtronic  . Stroke [facility])    "they say I had a stroke last year" denies residual on 03/15/2013  . Subdural hematoma ([facility])       Assessment: 75 YOF on Coumadin PTA for history of Afib and mechanical mitral valve.  Patient presented with chest pain and elevated troponin in setting of sub-therapeutic INR; therefore, she was switched to IV heparin bridge.  Now s/p cath and Pharmacy consulted to resume Coumadin tonight and IV heparin 8 hours post sheath removal.  Sheath was removed at 1650.  No bleeding nor hematoma documented.  INR currently at 1.81.  Noted history of intra-abdominal hemorrhage in 2013.  Home Coumadin regimen:  5mg  daily except 7.5mg  on Mon/Fri   Goal of Therapy:  Heparin level 0.3-0.7 units/mL INR 2 - 2.5 per [facility] clinic note Monitor platelets by anticoagulation protocol: Yes    Plan:  - Coumadin 6mg  PO today - At 0100 on 10/02/15, resume heparin gtt at 750 units/hr, no bolus post cath - Check 8 hr heparin level -  Daily heparin level, CBC, PT / INR - Monitor closely for bleeding   Lannette Avellino D. Mina Marble, PharmD, BCPS Pager:  [phone] 09/30/2016, 5:57 PM

## 2016-09-30 NOTE — Interval H&P Note (Signed)
Cath Lab Visit (complete for each Cath Lab visit)  Clinical Evaluation Leading to the Procedure:   ACS: Yes.    Prior CABG: No previous CABG      History and Physical Interval Note:  09/30/2016 3:37 PM  Carrie [name]  has presented today for surgery, with the diagnosis of cp  The various methods of treatment have been discussed with the patient and family. After consideration of risks, benefits and other options for treatment, the patient has consented to  Procedure(s): Left Heart Cath and Coronary Angiography (N/A) as a surgical intervention .  The patient's history has been reviewed, patient examined, no change in status, stable for surgery.  I have reviewed the patient's chart and labs.  Questions were answered to the patient's satisfaction.     Kathlyn [place]

## 2016-10-01 ENCOUNTER — Inpatient Hospital Stay (HOSPITAL_COMMUNITY): Payer: Medicare Other

## 2016-10-01 ENCOUNTER — Encounter (HOSPITAL_COMMUNITY): Payer: Self-pay | Admitting: Cardiovascular Disease

## 2016-10-01 DIAGNOSIS — Z952 Presence of prosthetic heart valve: Secondary | ICD-10-CM

## 2016-10-01 DIAGNOSIS — I219 Acute myocardial infarction, unspecified: Secondary | ICD-10-CM

## 2016-10-01 DIAGNOSIS — I480 Paroxysmal atrial fibrillation: Secondary | ICD-10-CM

## 2016-10-01 LAB — BASIC METABOLIC PANEL
ANION GAP: 8 (ref 5–15)
BUN: 16 mg/dL (ref 6–20)
CO2: 23 mmol/L (ref 22–32)
Calcium: 9.7 mg/dL (ref 8.9–10.3)
Chloride: 106 mmol/L (ref 101–111)
Creatinine, Ser: 0.92 mg/dL (ref 0.44–1.00)
GFR calc Af Amer: >60 mL/min (ref >60)
GFR calc non Af Amer: 60 mL/min — ABNORMAL LOW (ref >60)
GLUCOSE: 95 mg/dL (ref 65–99)
POTASSIUM: 4.1 mmol/L (ref 3.5–5.1)
Sodium: 137 mmol/L (ref 135–145)

## 2016-10-01 LAB — CBC
HEMATOCRIT: 29.8 % — AB (ref 36.0–46.0)
Hemoglobin: 9.8 g/dL — ABNORMAL LOW (ref 12.0–15.0)
MCH: 26.8 pg (ref 26.0–34.0)
MCHC: 32.9 g/dL (ref 30.0–36.0)
MCV: 81.4 fL (ref 78.0–100.0)
Platelets: 198 10*3/uL (ref 150–400)
RBC: 3.66 MIL/uL — AB (ref 3.87–5.11)
RDW: 20.4 % — ABNORMAL HIGH (ref 11.5–15.5)
WBC: 6.1 10*3/uL (ref 4.0–10.5)

## 2016-10-01 LAB — ECHOCARDIOGRAM COMPLETE
Height: 61 in
Weight: 2186.96 oz

## 2016-10-01 LAB — POCT ACTIVATED CLOTTING TIME: ACTIVATED CLOTTING TIME: 274 s

## 2016-10-01 LAB — HEPARIN LEVEL (UNFRACTIONATED): Heparin Unfractionated: 0.4 IU/mL (ref 0.30–0.70)

## 2016-10-01 LAB — PROTIME-INR
INR: 1.52
Prothrombin Time: 18.5 seconds — ABNORMAL HIGH (ref 11.4–15.2)

## 2016-10-01 MED ORDER — WARFARIN SODIUM 6 MG PO TABS
6.0000 mg | ORAL_TABLET | Freq: Once | ORAL | Status: AC
Start: 2016-10-01 — End: 2016-10-01
  Administered 2016-10-01: 19:00:00 6 mg via ORAL
  Filled 2016-10-01: qty 1

## 2016-10-01 MED ORDER — METOPROLOL TARTRATE 25 MG PO TABS
25.0000 mg | ORAL_TABLET | Freq: Two times a day (BID) | ORAL | Status: DC
  Administered 2016-10-01 – 2016-10-02 (×2): 25 mg via ORAL
  Filled 2016-10-01 (×2): qty 1

## 2016-10-01 MED ORDER — METOPROLOL TARTRATE 5 MG/5ML IV SOLN
2.5000 mg | INTRAVENOUS | Status: DC | PRN
  Administered 2016-10-01: 2.5 mg via INTRAVENOUS
  Filled 2016-10-01: qty 5

## 2016-10-01 NOTE — Progress Notes (Signed)
Patient up to ambulate in the hall with steady gait. Monitor a fib with RVR rate up to 150's with activity. Patient denies chest pain, shortness of breath, feeling lightheaded or dizzy. After ambulation patients heart rate 80's to 150's at rest.  Text paged PA with information.

## 2016-10-01 NOTE — Progress Notes (Signed)
Patient Name: Carrie [name] Date of Encounter: 10/01/2016  Primary Cardiologist: Dr. [facility] Problem List     Active Problems:   Chest pain   Hypertensive heart disease with heart failure ([place])   Pure hypercholesterolemia     Subjective   Feels well this morning. No further episodes of chest pain.   Inpatient Medications    Scheduled Meds: . aspirin EC  81 mg Oral Daily  . atorvastatin  40 mg Oral q1800  . clopidogrel  75 mg Oral Q breakfast  . docusate sodium  200 mg Oral TID  . furosemide  40 mg Oral Daily  . metoprolol tartrate  12.5 mg Oral BID  . potassium chloride SA  40 mEq Oral Daily  . sodium chloride flush  3 mL Intravenous Q12H  . Warfarin - Pharmacist Dosing Inpatient   Does not apply q1800   Continuous Infusions: . heparin 750 Units/hr (10/01/16 0700)   PRN Meds: sodium chloride, acetaminophen, nitroGLYCERIN, ondansetron (ZOFRAN) IV, polyethylene glycol, sodium chloride flush   Vital Signs    Vitals:   09/30/16 2200 09/30/16 2300 10/01/16 0700 10/01/16 0707  BP: (!) 148/53 (!) 149/71 (!) 128/56 (!) 128/56  Pulse: 91 77 75 90  Resp: 18 (!) 23 (!) 21 16  Temp:    98.8 F (37.1 C)  TempSrc:    Oral  SpO2: 98% 96% 97% 97%  Weight:    136 lb 11 oz (62 kg)  Height:        Intake/Output Summary (Last 24 hours) at 10/01/16 0833 Last data filed at 10/01/16 0830  Gross per 24 hour  Intake          1077.85 ml  Output             1300 ml  Net          -222.15 ml   Filed Weights   09/29/16 1738 09/30/16 0055 10/01/16 0707  Weight: 133 lb (60.3 kg) 135 lb 5.8 oz (61.4 kg) 136 lb 11 oz (62 kg)    Physical Exam    GEN: Well nourished, well developed, older AA female in no acute distress.  HEENT: Grossly normal.  Neck: Supple, no JVD, carotid bruits, or masses. Cardiac: Irreg Irreg, prosthetic valve click, rubs, or gallops. No clubbing, cyanosis, edema.  Radials/DP/PT 2+ and equal bilaterally.  Respiratory:  Respirations regular and  unlabored, clear to auscultation bilaterally. GI: Soft, nontender, nondistended, BS + x 4. MS: no deformity or atrophy. Right radial site with bruising, mild hematoma, no bruit noted. Skin: warm and dry, no rash. Neuro:  Strength and sensation are intact. Psych: AAOx3.  Normal affect.  Labs    CBC  Recent Labs  09/30/16 0542 10/01/16 0359  WBC 6.1 6.1  HGB 9.4* 9.8*  HCT 28.2* 29.8*  MCV 81.7 81.4  PLT 182 [id number]   Basic Metabolic Panel  Recent Labs  09/30/16 0542 10/01/16 0359  NA 137 137  K 4.0 4.1  CL 106 106  CO2 23 23  GLUCOSE 96 95  BUN 13 16  CREATININE 0.78 0.92  CALCIUM 9.6 9.7   Liver Function Tests No results for input(s): AST, ALT, ALKPHOS, BILITOT, PROT, ALBUMIN in the last 72 hours. No results for input(s): LIPASE, AMYLASE in the last 72 hours. Cardiac Enzymes No results for input(s): CKTOTAL, CKMB, CKMBINDEX, TROPONINI in the last 72 hours. BNP Invalid input(s): POCBNP D-Dimer No results for input(s): DDIMER in the last 72 hours. Hemoglobin A1C No  results for input(s): HGBA1C in the last 72 hours. Fasting Lipid Panel No results for input(s): CHOL, HDL, LDLCALC, TRIG, CHOLHDL, LDLDIRECT in the last 72 hours. Thyroid Function Tests  Recent Labs  09/30/16 0121  TSH 1.666    Telemetry    AF, some episodes of tachycardia, overall rate controlled - Personally Reviewed  ECG    AF with intermittent vpacing - Personally Reviewed  Radiology    Dg Chest 2 View  Result Date: 09/29/2016 CLINICAL DATA:  Chest pain and shortness of breath. EXAM: CHEST  2 VIEW COMPARISON:  Chest radiograph March 11, 2016 FINDINGS: Cardiac silhouette is moderate to severely enlarged, unchanged. Status post median sternotomy for cardiac valve replacement. Calcified aortic knob. Single LEFT cardiac pacemaker in situ. No pleural effusion or focal consolidation. Similar bandlike density RIGHT lower lung zone. No pneumothorax. Soft tissue planes and included osseous structures  are nonsuspicious. Mild degenerative change of the thoracic spine. IMPRESSION: Stable cardiomegaly.  RIGHT lung base atelectasis/ scarring. Electronically Signed   By: Elon Alas M.D.   On: 09/29/2016 18:59    Cardiac Studies   LHC: 09/30/16  Conclusion     There is mild left ventricular systolic dysfunction.  LV end diastolic pressure is normal.  The left ventricular ejection fraction is 45-50% by visual estimate.  There is no mitral valve regurgitation.  A drug eluting stent was successfully placed.  Ost RCA lesion, 90 %stenosed.  Post intervention, there is a 10% residual stenosis.   1. Severe one-vessel coronary artery disease affecting the ostial right coronary artery with 90% stenosis. Severe pressure dampening with catheter engagement. 2. Mildly reduced LV systolic function with global hypokinesis. Mildly elevated left ventricular end-diastolic pressure. 3. Successful angioplasty and drug-eluting stent placement to the ostial right coronary artery. 4. Moderately difficult catheterization via the right radial artery due to tortuous innominate artery.  Recommendations: Resume heparin in 8 hours after sheath will given that the patient has atrial fibrillation and mechanical mitral valve. Warfarin will be resumed tonight as well. In terms of antiplatelet therapy, I recommend aspirin and Plavix until INR is therapeutic. After that, aspirin can be discontinued. The patient can continue on warfarin and Plavix monotherapy for one year. Recommend aggressive treatment of risk factors.    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 Trop peaked at 5. Underwent LHC yesterday.   Assessment & Plan    1. NSTEMI: Present with chest pain, n/v that started yesterday while she was having a BM. Chest pain came first, then became nauseated and vomited. Reports this was similar to the pain she had back in 6/17 when she underwent cath. Cath at that time showed non-obstructive CAD. Was given nitro with improvement in symptoms. Trop peaked at 5.  -- Underwent LHC yesterday showing severe one vessel disease in the RCA with 90% stenosis with successful angioplasty with DES. Plan to continue on ASA, Plavix and coumadin until INR is therapeutic, then DC ASA. Continue to warfarin and plavix for one year.  -- Will arrange for INR check and follow up TCM appt.   2. AF: On diltiazem 120mg  daily at home for rate control. This was held  on admission given her mildly reduced EF and started on metoprolol. Rate is generally controlled.  -- coumadin was restarted, INR 1.52 this morning. Hgb stable at 9.8.   3. Combined HF: Does not appear to be volume overloaded on exam. Last Echo showed EF of 45-50%.   4. Valvular disease: s/p MVR, known moderate/severe TR. Appears to be volume stable on exam. MVR stable on last echo.   5. HTN: slightly hypertensive on admission, but improved today.   Signed, Reino Bellis, NP  10/01/2016, 8:33 AM

## 2016-10-01 NOTE — Progress Notes (Signed)
CARDIAC REHAB PHASE I   PRE:  Rate/Rhythm: 89 afib/pacing    BP: sitting 121/47    SaO2: 98 RA  MODE:  Ambulation: 400 ft   POST:  Rate/Rhythm: 115 afib/pacing with many PVCs    BP: sitting 156/75     SaO2: 100 RA  Pt able to walk with slight assist. Sts she sways at baseline, esp when she is distracted. Discussed option of having a cane "just in case". She has never fallen. Increased PVCs with walking. Ed completed with good reception. Understands Coumadin/Plavix. Reviewed diet and low sodium and daily wts. Encouraged ex and she agrees to referral for CRPII in [place].  [place], ACSM 10/01/2016 9:55 AM

## 2016-10-01 NOTE — Progress Notes (Addendum)
ANTICOAGULATION CONSULT NOTE  Pharmacy Consult:  Heparin / Coumadin Indication: atrial fibrillation  Allergies  Allergen Reactions  . Codeine Nausea And Vomiting    Patient Measurements: Height: 5\' 1"  (154.9 cm) Weight: 136 lb 11 oz (62 kg) IBW/kg (Calculated) : 47.8 Heparin Dosing Weight: 52 kg  Vital Signs: Temp: 98.8 F (37.1 C) (01/11 0707) Temp Source: Oral (01/11 0707) BP: 128/56 (01/11 0707) Pulse Rate: 90 (01/11 0707)  Labs:  Recent Labs  09/29/16 1756 09/29/16 1837 09/30/16 0542 09/30/16 1345 10/01/16 0359  HGB 10.9*  --  9.4*  --  9.8*  HCT 32.6*  --  28.2*  --  29.8*  PLT 241  --  182  --  198  LABPROT  --  20.3* 21.2*  --  18.5*  INR  --  1.71 1.81  --  1.52  HEPARINUNFRC  --   --  0.30 0.53  --   CREATININE 1.00  --  0.78  --  0.92    Estimated Creatinine Clearance: 45.3 mL/min (by C-G formula based on SCr of 0.92 mg/dL).   Medical History: Past Medical History:  Diagnosis Date  . Anticoagulated on Coumadin    for mech valve and atrial fib  goal 2.0-2.5  . Anxiety   . Arthritis    "right shoulder" (03/15/2013)  . Atrial fibrillation ([place])   . Atrial flutter ([place])   . CAD (coronary artery disease) 08/11/2016  . CHF (congestive heart failure) ([place])   . Chronic combined systolic and diastolic CHF (congestive heart failure) ([place])    a. 02/2016 Echo: EF 45-50%.  . Diverticula of colon   . Exertional shortness of breath   . GERD (gastroesophageal reflux disease)   . Heart murmur   . Hemorrhage intraabdominal 03/17/2012  . History of blood transfusion    "once" (03/15/2013)  . Hyperlipidemia   . Hypertension   . Liver hemorrhage   . Migraines   . 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 Bi-leaflet St. Jude mechanical prosthesis; b. 02/2016 Echo: EF 45-50%, some degree of MR, sev dil LA/RA, sev TR.  . NSTEMI (non-ST elevated myocardial infarction) ([place])    a. 02/2016 elev trop/Cath: nonobs dzs,   . Pacemaker    medtronic adapta  .  Pneumonia 2009   resolved.? OPD Rx  . Severe tricuspid regurgitation    a. 02/2016 Echo: Ef 45-50%, sev TR, PASP 52mmHg.  . Sick sinus syndrome ([place])    Dr Cristopher Peru. EP study negative. Pacemaker 12/15/06 Medtronic  . Stroke [facility])    "they say I had a stroke last year" denies residual on 03/15/2013  . Subdural hematoma (HCC)       Assessment: 75 YOF on Coumadin PTA for history of Afib and mechanical mitral valve.  Patient presented with chest pain and elevated troponin in setting of sub-therapeutic INR; therefore, she was switched to IV heparin bridge.  Now s/p cath - Coumadin and IV heparin resumed per Pharmacy. CBC stable, plt wnl. No bleeding documented.  INR down to 1.52 after 1 dose of coumadin. Heparin level remains therapeutic (0.4) on 750 units/h. Noted lower INR goal due to history of liver hemorrhage and subdural hematoma in 2013 per anticoagulation clinic notes.  Home Coumadin regimen:  5mg  daily except 7.5mg  on Mon/Fri   Goal of Therapy:  Heparin level 0.3-0.7 units/mL INR 2 - 2.5 per [facility] clinic note Monitor platelets by anticoagulation protocol: Yes    Plan:  - Heparin at 750 units/h - d/c  when INR therapeutic >24h - Coumadin 6mg  PO x 1 dose tonight - Daily heparin level, CBC, PT / INR - Monitor closely for bleeding   Elicia Lamp, PharmD, BCPS Clinical Pharmacist 10/01/2016 10:46 AM

## 2016-10-01 NOTE — Progress Notes (Signed)
Patient ambulated in hall with steady gait, no complaints of chest pain, shortness of breath, not lightheaded or dizzy.

## 2016-10-02 DIAGNOSIS — Z955 Presence of coronary angioplasty implant and graft: Secondary | ICD-10-CM

## 2016-10-02 DIAGNOSIS — I5033 Acute on chronic diastolic (congestive) heart failure: Secondary | ICD-10-CM

## 2016-10-02 DIAGNOSIS — I5032 Chronic diastolic (congestive) heart failure: Secondary | ICD-10-CM | POA: Diagnosis present

## 2016-10-02 LAB — CBC
HEMATOCRIT: 29.1 % — AB (ref 36.0–46.0)
HEMOGLOBIN: 9.7 g/dL — AB (ref 12.0–15.0)
MCH: 27.2 pg (ref 26.0–34.0)
MCHC: 33.3 g/dL (ref 30.0–36.0)
MCV: 81.5 fL (ref 78.0–100.0)
Platelets: 195 10*3/uL (ref 150–400)
RBC: 3.57 MIL/uL — ABNORMAL LOW (ref 3.87–5.11)
RDW: 20.1 % — ABNORMAL HIGH (ref 11.5–15.5)
WBC: 7.1 10*3/uL (ref 4.0–10.5)

## 2016-10-02 LAB — HEPARIN LEVEL (UNFRACTIONATED)
HEPARIN UNFRACTIONATED: 0.24 [IU]/mL — AB (ref 0.30–0.70)
Heparin Unfractionated: 0.49 IU/mL (ref 0.30–0.70)

## 2016-10-02 LAB — PROTIME-INR
INR: 1.78
PROTHROMBIN TIME: 21 s — AB (ref 11.4–15.2)

## 2016-10-02 MED ORDER — WARFARIN SODIUM 6 MG PO TABS
6.0000 mg | ORAL_TABLET | Freq: Once | ORAL | Status: AC
  Administered 2016-10-02: 6 mg via ORAL
  Filled 2016-10-02: qty 1

## 2016-10-02 MED ORDER — METOPROLOL TARTRATE 25 MG PO TABS
37.5000 mg | ORAL_TABLET | Freq: Two times a day (BID) | ORAL | Status: DC
  Administered 2016-10-02 – 2016-10-05 (×6): 37.5 mg via ORAL
  Filled 2016-10-02 (×6): qty 1

## 2016-10-02 MED ORDER — FUROSEMIDE 10 MG/ML IJ SOLN
40.0000 mg | Freq: Two times a day (BID) | INTRAMUSCULAR | Status: DC
  Administered 2016-10-02 – 2016-10-04 (×5): 40 mg via INTRAVENOUS
  Filled 2016-10-02 (×5): qty 4

## 2016-10-02 NOTE — Progress Notes (Signed)
ANTICOAGULATION CONSULT NOTE - Follow Up Consult  Pharmacy Consult for heparin Indication: Afib and MVR  Labs:  Recent Labs  09/29/16 1756 09/29/16 1837  09/30/16 0542 09/30/16 1345 10/01/16 0359 10/01/16 1132 10/02/16 0216  HGB 10.9*  --   --  9.4*  --  9.8*  --  9.7*  HCT 32.6*  --   --  28.2*  --  29.8*  --  29.1*  PLT 241  --   --  182  --  198  --  PENDING  LABPROT  --  20.3*  --  21.2*  --  18.5*  --   --   INR  --  1.71  --  1.81  --  1.52  --   --   HEPARINUNFRC  --   --   < > 0.30 0.53  --  0.40 0.24*  CREATININE 1.00  --   --  0.78  --  0.92  --   --   < > = values in this interval not displayed.   Assessment: 76yo female now subtherapeutic on heparin after one level at goal after resuming.  Goal of Therapy:  Heparin level 0.3-0.7 units/ml   Plan:  Will increase heparin gtt by 1-2 units/kg/hr to 850 units/hr and check level in [place], PharmD, BCPS  10/02/2016,3:20 AM

## 2016-10-02 NOTE — Progress Notes (Signed)
0930 Came to see pt to walk. Taking a bath at this time. NT stated will walk with pt later. Graylon Good RN BSN 10/02/2016 10:21 AM

## 2016-10-02 NOTE — Progress Notes (Addendum)
ANTICOAGULATION CONSULT NOTE  Pharmacy Consult:  Heparin / Coumadin Indication: atrial fibrillation  Allergies  Allergen Reactions  . Codeine Nausea And Vomiting    Patient Measurements: Height: 5\' 1"  (154.9 cm) Weight: 140 lb 10.5 oz (63.8 kg) IBW/kg (Calculated) : 47.8 Heparin Dosing Weight: 52 kg  Vital Signs: Temp: 98.3 F (36.8 C) (01/12 0757) Temp Source: Oral (01/12 0757) BP: 129/50 (01/12 0757) Pulse Rate: 82 (01/12 0757)  Labs:  Recent Labs  09/29/16 1756  09/30/16 0542 09/30/16 1345 10/01/16 0359 10/01/16 1132 10/02/16 0216  HGB 10.9*  --  9.4*  --  9.8*  --  9.7*  HCT 32.6*  --  28.2*  --  29.8*  --  29.1*  PLT 241  --  182  --  198  --  195  LABPROT  --   < > 21.2*  --  18.5*  --  21.0*  INR  --   < > 1.81  --  1.52  --  1.78  HEPARINUNFRC  --   < > 0.30 0.53  --  0.40 0.24*  CREATININE 1.00  --  0.78  --  0.92  --   --   < > = values in this interval not displayed.  Estimated Creatinine Clearance: 45.9 mL/min (by C-G formula based on SCr of 0.92 mg/dL).   Medical History: Past Medical History:  Diagnosis Date  . Anticoagulated on Coumadin    for mech valve and atrial fib  goal 2.0-2.5  . Anxiety   . Arthritis    "right shoulder" (03/15/2013)  . Atrial fibrillation ([place])   . Atrial flutter ([place])   . CAD (coronary artery disease) 08/11/2016  . CHF (congestive heart failure) ([place])   . Chronic combined systolic and diastolic CHF (congestive heart failure) ([place])    a. 02/2016 Echo: EF 45-50%.  . Diverticula of colon   . Exertional shortness of breath   . GERD (gastroesophageal reflux disease)   . Heart murmur   . Hemorrhage intraabdominal 03/17/2012  . History of blood transfusion    "once" (03/15/2013)  . Hyperlipidemia   . Hypertension   . Liver hemorrhage   . Migraines   . 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 Bi-leaflet St. Jude mechanical prosthesis; b. 02/2016 Echo: EF 45-50%, some degree of MR, sev dil LA/RA, sev TR.  .  NSTEMI (non-ST elevated myocardial infarction) ([place])    a. 02/2016 elev trop/Cath: nonobs dzs,   . Pacemaker    medtronic adapta  . Pneumonia 2009   resolved.? OPD Rx  . Severe tricuspid regurgitation    a. 02/2016 Echo: Ef 45-50%, sev TR, PASP 87mmHg.  . Sick sinus syndrome ([place])    Dr Cristopher Peru. EP study negative. Pacemaker 12/15/06 Medtronic  . Stroke [facility])    "they say I had a stroke last year" denies residual on 03/15/2013  . Subdural hematoma (HCC)       Assessment: 75 YOF on Coumadin PTA for history of Afib and mechanical mitral valve. Patient presented with chest pain and elevated troponin in setting of sub-therapeutic INR; therefore, she was switched to IV heparin bridge.  Now s/p cath - Coumadin and IV heparin resumed per Pharmacy. CBC stable, plt wnl. No bleeding documented.  INR up to 1.78 after 2 doses of coumadin. Noted lower INR goal due to history of liver hemorrhage and subdural hematoma in 2013 per anticoagulation clinic notes - MD aware. Heparin level therapeutic (0.49) on 850 units/h.  Home Coumadin  regimen:  5mg  daily except 7.5mg  on Mon/Fri   Goal of Therapy:  Heparin level 0.3-0.7 units/mL INR 2 - 2.5 per [facility] clinic note Monitor platelets by anticoagulation protocol: Yes    Plan:  - Heparin at 850 units/h - d/c when INR therapeutic >24h - Coumadin 6mg  PO x 1 dose tonight - Daily heparin level, CBC, PT / INR - Monitor closely for bleeding   Elicia Lamp, PharmD, BCPS Clinical Pharmacist 10/02/2016 10:19 AM

## 2016-10-02 NOTE — Care Management Important Message (Signed)
Important Message  Patient Details  Name: Carrie [name] MRN: [id number] Date of Birth: Oct 28, 1940   Medicare Important Message Given:  Yes    Willy Vorce [address] 10/02/2016, 12:42 PM

## 2016-10-02 NOTE — Progress Notes (Signed)
Patient Name: Carrie [name] Date of Encounter: 10/02/2016  Primary Cardiologist: [facility]. [facility] Problem List     Active Problems:   Chest pain   Hypertensive heart disease with heart failure ([place])   Pure hypercholesterolemia   H/O mitral valve replacement with mechanical valve   Paroxysmal atrial fibrillation (HCC)    Subjective   Feeling well this morning. No complaints  Inpatient Medications    Scheduled Meds: . aspirin EC  81 mg Oral Daily  . atorvastatin  40 mg Oral q1800  . clopidogrel  75 mg Oral Q breakfast  . docusate sodium  200 mg Oral TID  . furosemide  40 mg Oral Daily  . metoprolol tartrate  25 mg Oral BID  . potassium chloride SA  40 mEq Oral Daily  . sodium chloride flush  3 mL Intravenous Q12H  . Warfarin - Pharmacist Dosing Inpatient   Does not apply q1800   Continuous Infusions: . heparin 850 Units/hr (10/02/16 0833)   PRN Meds: sodium chloride, acetaminophen, metoprolol, nitroGLYCERIN, ondansetron (ZOFRAN) IV, polyethylene glycol, sodium chloride flush   Vital Signs    Vitals:   10/01/16 2000 10/02/16 0258 10/02/16 0300 10/02/16 0757  BP:  (!) 110/59 (!) 110/59 (!) 129/50  Pulse:  82  82  Resp: (!) 24 20 16    Temp:  97 F (36.1 C)  98.3 F (36.8 C)  TempSrc:  Axillary  Oral  SpO2:  97% 97% 98%  Weight:  140 lb 10.5 oz (63.8 kg)    Height:        Intake/Output Summary (Last 24 hours) at 10/02/16 0903 Last data filed at 10/02/16 0807  Gross per 24 hour  Intake          1263.68 ml  Output             2500 ml  Net         -1236.32 ml   Filed Weights   09/30/16 0055 10/01/16 0707 10/02/16 0258  Weight: 135 lb 5.8 oz (61.4 kg) 136 lb 11 oz (62 kg) 140 lb 10.5 oz (63.8 kg)    Physical Exam    GEN: Well nourished, well developed, older AA female in no acute distress.  HEENT: Grossly normal.  Neck: Supple, + JVD, carotid bruits, or masses. Cardiac: Irreg Irreg, prosthetic valve click, rubs, or gallops. No clubbing, cyanosis,  edema.  Radials/DP/PT 2+ and equal bilaterally.  Respiratory:  Respirations regular and unlabored, clear to auscultation bilaterally. GI: Soft, nontender, nondistended, BS + x 4. MS: no deformity or atrophy. Right radial site with bruising, mild hematoma, no bruit noted, (improved today) Skin: warm and dry, no rash. Neuro:  Strength and sensation are intact. Psych: AAOx3.  Normal affect.  Labs    CBC  Recent Labs  10/01/16 0359 10/02/16 0216  WBC 6.1 7.1  HGB 9.8* 9.7*  HCT 29.8* 29.1*  MCV 81.4 81.5  PLT 198 0000000   Basic Metabolic Panel  Recent Labs  09/30/16 0542 10/01/16 0359  NA 137 137  K 4.0 4.1  CL 106 106  CO2 23 23  GLUCOSE 96 95  BUN 13 16  CREATININE 0.78 0.92  CALCIUM 9.6 9.7   Liver Function Tests No results for input(s): AST, ALT, ALKPHOS, BILITOT, PROT, ALBUMIN in the last 72 hours. No results for input(s): LIPASE, AMYLASE in the last 72 hours. Cardiac Enzymes No results for input(s): CKTOTAL, CKMB, CKMBINDEX, TROPONINI in the last 72 hours. BNP Invalid input(s): POCBNP  D-Dimer No results for input(s): DDIMER in the last 72 hours. Hemoglobin A1C No results for input(s): HGBA1C in the last 72 hours. Fasting Lipid Panel No results for input(s): CHOL, HDL, LDLCALC, TRIG, CHOLHDL, LDLDIRECT in the last 72 hours. Thyroid Function Tests  Recent Labs  09/30/16 0121  TSH 1.666    Telemetry    AF, some episodes of tachycardia, overall rate controlled - Personally Reviewed  ECG    AF with intermittent vpacing - Personally Reviewed  Radiology    No results found.  Cardiac Studies   LHC: 09/30/16  Conclusion     There is mild left ventricular systolic dysfunction.  LV end diastolic pressure is normal.  The left ventricular ejection fraction is 45-50% by visual estimate.  There is no mitral valve regurgitation.  A drug eluting stent was successfully placed.  Ost RCA lesion, 90 %stenosed.  Post intervention, there is a 10%  residual stenosis.   1. Severe one-vessel coronary artery disease affecting the ostial right coronary artery with 90% stenosis. Severe pressure dampening with catheter engagement. 2. Mildly reduced LV systolic function with global hypokinesis. Mildly elevated left ventricular end-diastolic pressure. 3. Successful angioplasty and drug-eluting stent placement to the ostial right coronary artery. 4. Moderately difficult catheterization via the right radial artery due to tortuous innominate artery.  Recommendations: Resume heparin in 8 hours after sheath will given that the patient has atrial fibrillation and mechanical mitral valve. Warfarin will be resumed tonight as well. In terms of antiplatelet therapy, I recommend aspirin and Plavix until INR is therapeutic. After that, aspirin can be discontinued. The patient can continue on warfarin and Plavix monotherapy for one year. Recommend aggressive treatment of risk factors.   TTE: 10/01/16  Study Conclusions  - Left ventricle: Systolic function was normal. The estimated   ejection fraction was in the range of 50% to 55%. Wall motion was   normal; there were no regional wall motion abnormalities. Mitral   valve prosthesis and atrial fibrillation prevents evaluation of   LV diastolic function. - Ventricular septum: Septal motion showed paradox. The contour   showed diastolic flattening. These changes are consistent with RV   volume overload. - Aortic valve: Right coronary cusp mobility was moderately   restricted. There was trivial regurgitation. - Mitral valve: A mechanical prosthesis was present and functioning   normally. - Left atrium: The atrium was severely dilated. - Right ventricle: The cavity size was moderately dilated. Systolic   function was mildly to moderately reduced. - Right atrium: The atrium was severely dilated. - Atrial septum: No defect or patent foramen ovale was identified. - Tricuspid valve: There was severe  regurgitation. - Pulmonary arteries: Systolic pressure was moderately increased.   PA peak pressure: 55 mm Hg (S).  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 Trop peaked at 5. Underwent LHC showing lesion in the RCA treated with DES.   Assessment & Plan    1. NSTEMI: Present with chest pain, n/v that started yesterday while she was having a BM. Chest pain came first, then became nauseated and vomited. Reports this was similar to the pain she had back in 6/17 when she underwent  cath. Cath at that time showed non-obstructive CAD. Was given nitro with improvement in symptoms. Trop peaked at 5.  -- Underwent LHC showing severe one vessel disease in the RCA with 90% stenosis with successful angioplasty with DES. Plan to continue on ASA, Plavix and coumadin until INR is therapeutic, then DC ASA. Continue to warfarin and plavix for one year.  -- Echo yesterday showed EF of 50-55% with no WMA, MV with normal function.     2. AF: On diltiazem 120mg  daily at home for rate control. This was held on admission given her mildly reduced EF and started on metoprolol. Rate is generally controlled.  -- coumadin was restarted, INR 1.78 this morning. Hgb stable at 9.7. In review of notes and talking with pharmacy, INR range should be 2-2.5 given hx of severe bleeding  complications.   3. Combined HF: Does not appear to be volume overloaded on exam. Last Echo showed EF of 45-50%.   4. Valvular disease: s/p MVR, known moderate/severe TR. Appears to be volume stable on exam. MVR stable on echo this admission.    5. HTN: stable.  Signed, Reino Bellis, NP  10/02/2016, 9:03 AM

## 2016-10-03 LAB — BASIC METABOLIC PANEL
Anion gap: 8 (ref 5–15)
BUN: 17 mg/dL (ref 6–20)
CALCIUM: 9.8 mg/dL (ref 8.9–10.3)
CO2: 23 mmol/L (ref 22–32)
CREATININE: 1.01 mg/dL — AB (ref 0.44–1.00)
Chloride: 102 mmol/L (ref 101–111)
GFR calc Af Amer: >60 mL/min (ref >60)
GFR, EST NON AFRICAN AMERICAN: 53 mL/min — AB (ref >60)
Glucose, Bld: 98 mg/dL (ref 65–99)
Potassium: 4.1 mmol/L (ref 3.5–5.1)
SODIUM: 133 mmol/L — AB (ref 135–145)

## 2016-10-03 LAB — CBC
HCT: 30.8 % — ABNORMAL LOW (ref 36.0–46.0)
Hemoglobin: 10.3 g/dL — ABNORMAL LOW (ref 12.0–15.0)
MCH: 27.5 pg (ref 26.0–34.0)
MCHC: 33.4 g/dL (ref 30.0–36.0)
MCV: 82.1 fL (ref 78.0–100.0)
PLATELETS: 215 10*3/uL (ref 150–400)
RBC: 3.75 MIL/uL — ABNORMAL LOW (ref 3.87–5.11)
RDW: 20.9 % — AB (ref 11.5–15.5)
WBC: 7.4 10*3/uL (ref 4.0–10.5)

## 2016-10-03 LAB — HEPARIN LEVEL (UNFRACTIONATED): Heparin Unfractionated: 0.63 IU/mL (ref 0.30–0.70)

## 2016-10-03 LAB — PROTIME-INR
INR: 1.89
PROTHROMBIN TIME: 22 s — AB (ref 11.4–15.2)

## 2016-10-03 MED ORDER — WARFARIN SODIUM 5 MG PO TABS
6.0000 mg | ORAL_TABLET | Freq: Once | ORAL | Status: AC
  Administered 2016-10-03: 6 mg via ORAL
  Filled 2016-10-03: qty 1

## 2016-10-03 NOTE — Progress Notes (Signed)
ANTICOAGULATION CONSULT NOTE  Pharmacy Consult:  Heparin / warfarin Indication: atrial fibrillation  Patient Measurements: Height: 5\' 1"  (154.9 cm) Weight: 130 lb 3.2 oz (59.1 kg) IBW/kg (Calculated) : 47.8 Heparin Dosing Weight: 52 kg  Vital Signs: Temp: 98.5 F (36.9 C) (01/13 0557) Temp Source: Oral (01/13 0557) BP: 110/70 (01/13 0557) Pulse Rate: 95 (01/13 0557)  Labs:  Recent Labs  10/01/16 0359  10/02/16 0216 10/02/16 1130 10/03/16 0602  HGB 9.8*  --  9.7*  --  10.3*  HCT 29.8*  --  29.1*  --  30.8*  PLT 198  --  195  --  PENDING  LABPROT 18.5*  --  21.0*  --  22.0*  INR 1.52  --  1.78  --  1.89  HEPARINUNFRC  --   < > 0.24* 0.49 0.63  CREATININE 0.92  --   --   --  1.01*  < > = values in this interval not displayed.  Estimated Creatinine Clearance: 40.3 mL/min (by C-G formula based on SCr of 1.01 mg/dL (H)).   Medical History: Past Medical History:  Diagnosis Date  . Anticoagulated on Coumadin    for mech valve and atrial fib  goal 2.0-2.5  . Anxiety   . Arthritis    "right shoulder" (03/15/2013)  . Atrial fibrillation ([place])   . Atrial flutter ([place])   . CAD (coronary artery disease) 08/11/2016  . CHF (congestive heart failure) ([place])   . Chronic combined systolic and diastolic CHF (congestive heart failure) ([place])    a. 02/2016 Echo: EF 45-50%.  . Diverticula of colon   . Exertional shortness of breath   . GERD (gastroesophageal reflux disease)   . Heart murmur   . Hemorrhage intraabdominal 03/17/2012  . History of blood transfusion    "once" (03/15/2013)  . Hyperlipidemia   . Hypertension   . Liver hemorrhage   . Migraines   . 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 Bi-leaflet St. Jude mechanical prosthesis; b. 02/2016 Echo: EF 45-50%, some degree of MR, sev dil LA/RA, sev TR.  . NSTEMI (non-ST elevated myocardial infarction) ([place])    a. 02/2016 elev trop/Cath: nonobs dzs,   . Pacemaker    medtronic adapta  . Pneumonia 2009   resolved.? OPD Rx  . Severe tricuspid regurgitation    a. 02/2016 Echo: Ef 45-50%, sev TR, PASP 73mmHg.  . Sick sinus syndrome ([place])    Dr Cristopher Peru. EP study negative. Pacemaker 12/15/06 Medtronic  . Stroke [facility])    "they say I had a stroke last year" denies residual on 03/15/2013  . Subdural hematoma (HCC)     Assessment: 75 YOF on warfarin PTA for h/o Afib and mechanical mitral valve. Patient presented with chest pain and elevated troponin now s/p cath. Pharmacy consulted to re-start warfarin with heparin bridge - INR still subtherapeutic at 1.89 today, heparin level approaching upper limit of goal at 0.63 on 850 units/hr. CBC stable, no bleeding documented.  Of note, lower INR goal due to h/o liver hemorrhage and subdural hematoma in 2013 per anticoagulation clinic notes - MD aware.  PTA warfarin regimen: 5mg  daily except 7.5mg  on Mon/Fri  Goal of Therapy:  Heparin level 0.3-0.7 units/mL INR 2-2.5 per [facility] clinic note Monitor platelets by anticoagulation protocol: Yes    Plan:  - Decrease heparin to 800 units/hr - d/c when INR therapeutic >24h - Warfarin 6 mg PO x 1 tonight - Daily heparin level, INR, and CBC - Monitor for s/sx of  bleeding   Gwenlyn Perking, PharmD PGY1 Pharmacy Resident Pager: [phone] 10/03/2016 8:17 AM

## 2016-10-03 NOTE — Progress Notes (Signed)
CARDIAC REHAB PHASE I   PRE:  Rate/Rhythm: 76 afib    BP: sitting 118/62    SaO2: 97 RA  MODE:  Ambulation: 500 ft   POST:  Rate/Rhythm: 120 pacing/PVCs    BP: sitting 124/81     SaO2: 98 RA  Tolerated well except increased HR. Tired after walk. To recliner. Gave reminders for Plavix and CRPII.  [id number]   [place], ACSM 10/03/2016 11:23 AM

## 2016-10-03 NOTE — Progress Notes (Signed)
Patient Name: Carrie [name] Date of Encounter: 10/03/2016  Primary Cardiologist: Dr. [facility] Problem List     Active Problems:   Chest pain   Hypertensive heart disease with heart failure ([place])   Pure hypercholesterolemia   H/O mitral valve replacement with mechanical valve   Paroxysmal atrial fibrillation (HCC)   Status post coronary artery stent placement   Acute on chronic diastolic heart failure ([place])    Subjective   Feeling well this morning. No complaints  Inpatient Medications    Scheduled Meds: . aspirin EC  81 mg Oral Daily  . atorvastatin  40 mg Oral q1800  . clopidogrel  75 mg Oral Q breakfast  . docusate sodium  200 mg Oral TID  . furosemide  40 mg Intravenous BID  . metoprolol tartrate  37.5 mg Oral BID  . potassium chloride SA  40 mEq Oral Daily  . sodium chloride flush  3 mL Intravenous Q12H  . warfarin  6 mg Oral ONCE-1800  . Warfarin - Pharmacist Dosing Inpatient   Does not apply q1800   Continuous Infusions: . heparin 850 Units/hr (10/02/16 0833)   PRN Meds: sodium chloride, acetaminophen, metoprolol, nitroGLYCERIN, ondansetron (ZOFRAN) IV, polyethylene glycol, sodium chloride flush   Vital Signs    Vitals:   10/02/16 1600 10/02/16 2100 10/02/16 2207 10/03/16 0557  BP: 117/67 (!) 118/52  110/70  Pulse: 80 (!) 122 82 95  Resp: 16 16  18   Temp: 97.5 F (36.4 C) 98.3 F (36.8 C)  98.5 F (36.9 C)  TempSrc: Oral Oral  Oral  SpO2: 97% 97%  97%  Weight:    130 lb 3.2 oz (59.1 kg)  Height:        Intake/Output Summary (Last 24 hours) at 10/03/16 0940 Last data filed at 10/03/16 0603  Gross per 24 hour  Intake            206.5 ml  Output             1604 ml  Net          -1397.5 ml   Filed Weights   10/01/16 0707 10/02/16 0258 10/03/16 0557  Weight: 136 lb 11 oz (62 kg) 140 lb 10.5 oz (63.8 kg) 130 lb 3.2 oz (59.1 kg)    Physical Exam    GEN: Well nourished, well developed, older AA female in no acute distress.  HEENT:  Grossly normal.  Neck: Supple, + JVD to 8 cm at 45 degrees, carotid bruits, or masses. Cardiac: Irreg Irreg, prosthetic valve click, rubs, or gallops. No clubbing, cyanosis, edema.  Radials/DP/PT 2+ and equal bilaterally.  Respiratory:  Respirations regular and unlabored, clear to auscultation bilaterally. GI: Soft, nontender, nondistended, BS + x 4. MS: no deformity or atrophy. Right radial site with bruising, mild hematoma, no bruit noted, (improved today) Skin: warm and dry, no rash. Neuro:  Strength and sensation are intact. Psych: AAOx3.  Normal affect.  Labs    CBC  Recent Labs  10/02/16 0216 10/03/16 0602  WBC 7.1 7.4  HGB 9.7* 10.3*  HCT 29.1* 30.8*  MCV 81.5 82.1  PLT 195 [id number]   Basic Metabolic Panel  Recent Labs  10/01/16 0359 10/03/16 0602  NA 137 133*  K 4.1 4.1  CL 106 102  CO2 23 23  GLUCOSE 95 98  BUN 16 17  CREATININE 0.92 1.01*  CALCIUM 9.7 9.8   Liver Function Tests No results for input(s): AST, ALT, ALKPHOS, BILITOT, PROT, ALBUMIN  in the last 72 hours. No results for input(s): LIPASE, AMYLASE in the last 72 hours. Cardiac Enzymes No results for input(s): CKTOTAL, CKMB, CKMBINDEX, TROPONINI in the last 72 hours. BNP Invalid input(s): POCBNP D-Dimer No results for input(s): DDIMER in the last 72 hours. Hemoglobin A1C No results for input(s): HGBA1C in the last 72 hours. Fasting Lipid Panel No results for input(s): CHOL, HDL, LDLCALC, TRIG, CHOLHDL, LDLDIRECT in the last 72 hours. Thyroid Function Tests No results for input(s): TSH, T4TOTAL, T3FREE, THYROIDAB in the last 72 hours.  Invalid input(s): FREET3  Telemetry    AF, some episodes of tachycardia, overall rate controlled - Personally Reviewed  ECG    AF with intermittent vpacing - Personally Reviewed  Radiology    No results found.  Cardiac Studies   LHC: 09/30/16  Conclusion     There is mild left ventricular systolic dysfunction.  LV end diastolic pressure is  normal.  The left ventricular ejection fraction is 45-50% by visual estimate.  There is no mitral valve regurgitation.  A drug eluting stent was successfully placed.  Ost RCA lesion, 90 %stenosed.  Post intervention, there is a 10% residual stenosis.   1. Severe one-vessel coronary artery disease affecting the ostial right coronary artery with 90% stenosis. Severe pressure dampening with catheter engagement. 2. Mildly reduced LV systolic function with global hypokinesis. Mildly elevated left ventricular end-diastolic pressure. 3. Successful angioplasty and drug-eluting stent placement to the ostial right coronary artery. 4. Moderately difficult catheterization via the right radial artery due to tortuous innominate artery.  Recommendations: Resume heparin in 8 hours after sheath Yaresly Menzel given that the patient has atrial fibrillation and mechanical mitral valve. Warfarin Analysia Dungee be resumed tonight as well. In terms of antiplatelet therapy, I recommend aspirin and Plavix until INR is therapeutic. After that, aspirin can be discontinued. The patient can continue on warfarin and Plavix monotherapy for one year. Recommend aggressive treatment of risk factors.   TTE: 10/01/16  Study Conclusions  - Left ventricle: Systolic function was normal. The estimated   ejection fraction was in the range of 50% to 55%. Wall motion was   normal; there were no regional wall motion abnormalities. Mitral   valve prosthesis and atrial fibrillation prevents evaluation of   LV diastolic function. - Ventricular septum: Septal motion showed paradox. The contour   showed diastolic flattening. These changes are consistent with RV   volume overload. - Aortic valve: Right coronary cusp mobility was moderately   restricted. There was trivial regurgitation. - Mitral valve: A mechanical prosthesis was present and functioning   normally. - Left atrium: The atrium was severely dilated. - Right ventricle: The cavity  size was moderately dilated. Systolic   function was mildly to moderately reduced. - Right atrium: The atrium was severely dilated. - Atrial septum: No defect or patent foramen ovale was identified. - Tricuspid valve: There was severe regurgitation. - Pulmonary arteries: Systolic pressure was moderately increased.   PA peak pressure: 55 mm Hg (S).  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 Trop peaked at 5. Underwent LHC showing lesion in the RCA treated with DES.   Assessment & Plan  1. NSTEMI: Trop peaked at 5. Underwent LHC showing severe one vessel disease in the RCA with 90% stenosis with successful angioplasty with DES. Plan to continue on ASA, Plavix and coumadin until INR is therapeutic, then DC ASA. Continue to warfarin and plavix for one year.  -- Echo yesterday showed EF of 50-55% with no WMA, MV with normal function.     2. AF: On diltiazem 120mg  daily at home for rate control. This was held on admission given her mildly reduced EF and started on metoprolol. Rate is generally controlled. Goal resting HR 90-110. -- coumadin was restarted, INR 1.89 this morning. Hgb stable at 10.3. In review of notes and talking with pharmacy, INR range should be 2-2.5 given hx of severe bleeding  complications.   3. Combined HF: Does not appear to be volume overloaded on exam. Last Echo showed EF of 45-50%. Continue metoprolol, Cydnie Deason likely need ACEI at discharge.  4. Valvular disease: s/p MVR, known moderate/severe TR. Appears to be volume stable on exam. MVR stable on echo this admission.  Continue diuresis.  5. HTN: stable.  Signed, Teyana Pierron Meredith Leeds, MD  10/03/2016, 9:40 AM

## 2016-10-04 LAB — BASIC METABOLIC PANEL
ANION GAP: 8 (ref 5–15)
BUN: 21 mg/dL — AB (ref 6–20)
CHLORIDE: 101 mmol/L (ref 101–111)
CO2: 22 mmol/L (ref 22–32)
Calcium: 9.7 mg/dL (ref 8.9–10.3)
Creatinine, Ser: 1.07 mg/dL — ABNORMAL HIGH (ref 0.44–1.00)
GFR calc Af Amer: 58 mL/min — ABNORMAL LOW (ref >60)
GFR calc non Af Amer: 50 mL/min — ABNORMAL LOW (ref >60)
GLUCOSE: 100 mg/dL — AB (ref 65–99)
POTASSIUM: 4.4 mmol/L (ref 3.5–5.1)
Sodium: 131 mmol/L — ABNORMAL LOW (ref 135–145)

## 2016-10-04 LAB — CBC
HEMATOCRIT: 30.9 % — AB (ref 36.0–46.0)
Hemoglobin: 10.2 g/dL — ABNORMAL LOW (ref 12.0–15.0)
MCH: 27.2 pg (ref 26.0–34.0)
MCHC: 33 g/dL (ref 30.0–36.0)
MCV: 82.4 fL (ref 78.0–100.0)
PLATELETS: 296 10*3/uL (ref 150–400)
RBC: 3.75 MIL/uL — ABNORMAL LOW (ref 3.87–5.11)
RDW: 20.8 % — AB (ref 11.5–15.5)
WBC: 7 10*3/uL (ref 4.0–10.5)

## 2016-10-04 LAB — PROTIME-INR
INR: 1.96
Prothrombin Time: 22.6 seconds — ABNORMAL HIGH (ref 11.4–15.2)

## 2016-10-04 LAB — HEPARIN LEVEL (UNFRACTIONATED): Heparin Unfractionated: 0.61 IU/mL (ref 0.30–0.70)

## 2016-10-04 MED ORDER — WARFARIN SODIUM 5 MG PO TABS
6.0000 mg | ORAL_TABLET | Freq: Once | ORAL | Status: AC
  Administered 2016-10-04: 17:00:00 6 mg via ORAL
  Filled 2016-10-04: qty 1

## 2016-10-04 NOTE — Discharge Instructions (Signed)
Information on my medicine - Coumadin   (Warfarin)  This medication education was reviewed with me or my healthcare representative as part of my discharge preparation.  The pharmacist that spoke with me during my hospital stay was:  Carlean Jews, [facility]  Why was Coumadin prescribed for you? Coumadin was prescribed for you because you have a blood clot or a medical condition that can cause an increased risk of forming blood clots. Blood clots can cause serious health problems by blocking the flow of blood to the heart, lung, or brain. Coumadin can prevent harmful blood clots from forming. As a reminder your indication for Coumadin is:   Stroke Prevention Because Of Atrial Fibrillation  What test will check on my response to Coumadin? While on Coumadin (warfarin) you will need to have an INR test regularly to ensure that your dose is keeping you in the desired range. The INR (international normalized ratio) number is calculated from the result of the laboratory test called prothrombin time (PT).  If an INR APPOINTMENT HAS NOT ALREADY BEEN MADE FOR YOU please schedule an appointment to have this lab work done by your health care provider within 7 days. Your INR goal is usually a number between:  2 to 3 or your provider may give you a more narrow range like 2-2.5.  Ask your health care provider during an office visit what your goal INR is.  What  do you need to  know  About  COUMADIN? Take Coumadin (warfarin) exactly as prescribed by your healthcare provider about the same time each day.  DO NOT stop taking without talking to the doctor who prescribed the medication.  Stopping without other blood clot prevention medication to take the place of Coumadin may increase your risk of developing a new clot or stroke.  Get refills before you run out.  What do you do if you miss a dose? If you miss a dose, take it as soon as you remember on the same day then continue your regularly scheduled regimen the next  day.  Do not take two doses of Coumadin at the same time.  Important Safety Information A possible side effect of Coumadin (Warfarin) is an increased risk of bleeding. You should call your healthcare provider right away if you experience any of the following: ? Bleeding from an injury or your nose that does not stop. ? Unusual colored urine (red or dark brown) or unusual colored stools (red or black). ? Unusual bruising for unknown reasons. ? A serious fall or if you hit your head (even if there is no bleeding).  Some foods or medicines interact with Coumadin (warfarin) and might alter your response to warfarin. To help avoid this: ? Eat a balanced diet, maintaining a consistent amount of Vitamin K. ? Notify your provider about major diet changes you plan to make. ? Avoid alcohol or limit your intake to 1 drink for women and 2 drinks for men per day. (1 drink is 5 oz. wine, 12 oz. beer, or 1.5 oz. liquor.)  Make sure that ANY health care provider who prescribes medication for you knows that you are taking Coumadin (warfarin).  Also make sure the healthcare provider who is monitoring your Coumadin knows when you have started a new medication including herbals and non-prescription products.  Coumadin (Warfarin)  Major Drug Interactions  Increased Warfarin Effect Decreased Warfarin Effect  Alcohol (large quantities) Antibiotics (esp. Septra/Bactrim, Flagyl, Cipro) Amiodarone (Cordarone) Aspirin (ASA) Cimetidine (Tagamet) Megestrol (Megace) NSAIDs (ibuprofen,  naproxen, etc.) °Piroxicam (Feldene) °Propafenone (Rythmol SR) °Propranolol (Inderal) °Isoniazid (INH) °Posaconazole (Noxafil) Barbiturates (Phenobarbital) °Carbamazepine (Tegretol) °Chlordiazepoxide (Librium) °Cholestyramine (Questran) °Griseofulvin °Oral Contraceptives °Rifampin °Sucralfate (Carafate) °Vitamin K  ° °Coumadin® (Warfarin) Major Herbal Interactions  °Increased Warfarin Effect Decreased Warfarin Effect   °Garlic °Ginseng °Ginkgo biloba Coenzyme Q10 °Green tea °St. John’s wort   ° °Coumadin® (Warfarin) FOOD Interactions  °Eat a consistent number of servings per week of foods HIGH in Vitamin K °(1 serving = ½ cup)  °Collards (cooked, or boiled & drained) °Kale (cooked, or boiled & drained) °Mustard greens (cooked, or boiled & drained) °Parsley *serving size only = ¼ cup °Spinach (cooked, or boiled & drained) °Swiss chard (cooked, or boiled & drained) °Turnip greens (cooked, or boiled & drained)  °Eat a consistent number of servings per week of foods MEDIUM-HIGH in Vitamin K °(1 serving = 1 cup)  °Asparagus (cooked, or boiled & drained) °Broccoli (cooked, boiled & drained, or raw & chopped) °Brussel sprouts (cooked, or boiled & drained) *serving size only = ½ cup °Lettuce, raw (green leaf, endive, romaine) °Spinach, raw °Turnip greens, raw & chopped  ° °These websites have more information on Coumadin (warfarin):  www.coumadin.com; °www.ahrq.gov/consumer/coumadin.htm; ° ° ° °

## 2016-10-04 NOTE — Progress Notes (Signed)
ANTICOAGULATION CONSULT NOTE  Pharmacy Consult:  Heparin / warfarin Indication: atrial fibrillation  Patient Measurements: Height: 5\' 1"  (154.9 cm) Weight: 130 lb 14.4 oz (59.4 kg) IBW/kg (Calculated) : 47.8 Heparin Dosing Weight: 52 kg  Vital Signs: Temp: 98.5 F (36.9 C) (01/14 0432) Temp Source: Oral (01/14 0432) BP: 110/57 (01/14 0432) Pulse Rate: 61 (01/14 0432)  Labs:  Recent Labs  10/02/16 0216 10/02/16 1130 10/03/16 0602 10/04/16 0455  HGB 9.7*  --  10.3* 10.2*  HCT 29.1*  --  30.8* 30.9*  PLT 195  --  215 296  LABPROT 21.0*  --  22.0* 22.6*  INR 1.78  --  1.89 1.96  HEPARINUNFRC 0.24* 0.49 0.63 0.61  CREATININE  --   --  1.01* 1.07*    Estimated Creatinine Clearance: 38.2 mL/min (by C-G formula based on SCr of 1.07 mg/dL (H)).   Medical History: Past Medical History:  Diagnosis Date  . Anticoagulated on Coumadin    for mech valve and atrial fib  goal 2.0-2.5  . Anxiety   . Arthritis    "right shoulder" (03/15/2013)  . Atrial fibrillation ([place])   . Atrial flutter ([place])   . CAD (coronary artery disease) 08/11/2016  . CHF (congestive heart failure) ([place])   . Chronic combined systolic and diastolic CHF (congestive heart failure) ([place])    a. 02/2016 Echo: EF 45-50%.  . Diverticula of colon   . Exertional shortness of breath   . GERD (gastroesophageal reflux disease)   . Heart murmur   . Hemorrhage intraabdominal 03/17/2012  . History of blood transfusion    "once" (03/15/2013)  . Hyperlipidemia   . Hypertension   . Liver hemorrhage   . Migraines   . 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 Bi-leaflet St. Jude mechanical prosthesis; b. 02/2016 Echo: EF 45-50%, some degree of MR, sev dil LA/RA, sev TR.  . NSTEMI (non-ST elevated myocardial infarction) ([place])    a. 02/2016 elev trop/Cath: nonobs dzs,   . Pacemaker    medtronic adapta  . Pneumonia 2009   resolved.? OPD Rx  . Severe tricuspid regurgitation    a. 02/2016 Echo: Ef 45-50%, sev  TR, PASP 76mmHg.  . Sick sinus syndrome ([place])    Dr Cristopher Peru. EP study negative. Pacemaker 12/15/06 Medtronic  . Stroke [facility])    "they say I had a stroke last year" denies residual on 03/15/2013  . Subdural hematoma (HCC)     Assessment: Carrie [name] on warfarin PTA for h/o Afib and mechanical mitral valve. Patient presented with chest pain and elevated troponin now s/p cath. Pharmacy consulted to re-start warfarin with heparin bridge - INR slightly subtherapeutic at 1.96 today, heparin level therapeutic at 0.61 on 800 units/hr. CBC stable, no bleeding documented.  Of note, lower INR goal due to h/o liver hemorrhage and subdural hematoma in 2013 per anticoagulation clinic notes - MD aware.  PTA warfarin regimen: 5mg  daily except 7.5mg  on Mon/Fri  Goal of Therapy:  Heparin level 0.3-0.7 units/mL INR 2-2.5 per [facility] clinic note Monitor platelets by anticoagulation protocol: Yes    Plan:  - Continue heparin at 800 units/hr - d/c when INR therapeutic >24h - Warfarin 6 mg PO x 1 tonight - Daily heparin level, INR, and CBC - Monitor for s/sx of bleeding   Gwenlyn Perking, PharmD PGY1 Pharmacy Resident Pager: [phone] 10/04/2016 9:58 AM

## 2016-10-05 LAB — BASIC METABOLIC PANEL
Anion gap: 9 (ref 5–15)
BUN: 22 mg/dL — AB (ref 6–20)
CHLORIDE: 100 mmol/L — AB (ref 101–111)
CO2: 22 mmol/L (ref 22–32)
CREATININE: 1.09 mg/dL — AB (ref 0.44–1.00)
Calcium: 9.7 mg/dL (ref 8.9–10.3)
GFR calc Af Amer: 57 mL/min — ABNORMAL LOW (ref >60)
GFR calc non Af Amer: 49 mL/min — ABNORMAL LOW (ref >60)
GLUCOSE: 94 mg/dL (ref 65–99)
Potassium: 4.3 mmol/L (ref 3.5–5.1)
SODIUM: 131 mmol/L — AB (ref 135–145)

## 2016-10-05 LAB — CBC
HEMATOCRIT: 30 % — AB (ref 36.0–46.0)
Hemoglobin: 10 g/dL — ABNORMAL LOW (ref 12.0–15.0)
MCH: 27.4 pg (ref 26.0–34.0)
MCHC: 33.3 g/dL (ref 30.0–36.0)
MCV: 82.2 fL (ref 78.0–100.0)
Platelets: 235 10*3/uL (ref 150–400)
RBC: 3.65 MIL/uL — ABNORMAL LOW (ref 3.87–5.11)
RDW: 21.5 % — AB (ref 11.5–15.5)
WBC: 7.2 10*3/uL (ref 4.0–10.5)

## 2016-10-05 LAB — PROTIME-INR
INR: 1.89
PROTHROMBIN TIME: 22 s — AB (ref 11.4–15.2)

## 2016-10-05 LAB — HEPARIN LEVEL (UNFRACTIONATED): Heparin Unfractionated: 0.53 IU/mL (ref 0.30–0.70)

## 2016-10-05 MED ORDER — METOPROLOL TARTRATE 25 MG PO TABS
25.0000 mg | ORAL_TABLET | Freq: Two times a day (BID) | ORAL | Status: DC
  Administered 2016-10-05 – 2016-10-08 (×4): 25 mg via ORAL
  Filled 2016-10-05 (×6): qty 1

## 2016-10-05 MED ORDER — FUROSEMIDE 40 MG PO TABS
40.0000 mg | ORAL_TABLET | Freq: Every day | ORAL | Status: DC
  Administered 2016-10-06 – 2016-10-08 (×3): 40 mg via ORAL
  Filled 2016-10-05 (×3): qty 1

## 2016-10-05 MED ORDER — WARFARIN SODIUM 7.5 MG PO TABS
7.5000 mg | ORAL_TABLET | Freq: Once | ORAL | Status: AC
  Administered 2016-10-05: 7.5 mg via ORAL
  Filled 2016-10-05: qty 1

## 2016-10-05 MED ORDER — DILTIAZEM HCL ER COATED BEADS 120 MG PO CP24
120.0000 mg | ORAL_CAPSULE | Freq: Every day | ORAL | Status: DC
  Administered 2016-10-05: 120 mg via ORAL
  Filled 2016-10-05 (×2): qty 1

## 2016-10-05 NOTE — Consult Note (Signed)
[facility] CM Primary Care Navigator  10/05/2016  Carrie [name] 1941-05-24 [id number]  Met with patient and close friend Rosaria Ferries) at the bedside to identify possible discharge needs. Patient gave permission to discuss information in the presence of her close friend.  She reports nausea, chest pressure/ pain that led to this admission.  Patient endorses Dr. Lucianne Lei with [facility] as the primary care provider.    Patient shared using  [facility] pharmacy at [facility] to obtain medications without any problem.   Patient reports managing her own medications at home using "pill box" system weekly.   Husband Jeneen Rinks) provides transportation to her doctors' appointments and he is the primary caregiver at home as reported by patient.  Discharge plan is home with husband's assistance according to patient.  Patient voiced understanding to call primary care provider's office for a post discharge follow-up appointment within a week or sooner if needs arise. Patient letter provided as a reminder.  Patient mentioned that she had been previously seen by [facility] community care coordinator and expressed awareness in managing her Heart Failure as previously discussed with her.  She voiced still having [facility] educational materials regarding HF. She denies further needs or concerns at this time.  Patient agreed to call [facility] care management if any further issues arise.   For additional questions please contact:  Edwena Felty A. Camani Sesay, BSN, RN-BC [facility] PRIMARY CARE Navigator Cell: [phone]

## 2016-10-05 NOTE — Progress Notes (Signed)
Patient Name: Carrie [name] Date of Encounter: 10/05/2016  Primary Cardiologist: Dr. Delila Pereyra Problem List     Principal Problem:   NSTEMI (non-ST elevated myocardial infarction) [facility]) Active Problems:   Chest pain   Hypertensive heart disease with heart failure ([place])   Pure hypercholesterolemia   H/O mitral valve replacement with mechanical valve   Paroxysmal atrial fibrillation ([place])   Status post coronary artery stent placement   Acute on chronic diastolic heart failure (HCC)     Subjective   No chest pain and DOE only, comfortable in bed  Inpatient Medications    Scheduled Meds: . aspirin EC  81 mg Oral Daily  . atorvastatin  40 mg Oral q1800  . clopidogrel  75 mg Oral Q breakfast  . docusate sodium  200 mg Oral TID  . furosemide  40 mg Intravenous BID  . metoprolol tartrate  37.5 mg Oral BID  . potassium chloride SA  40 mEq Oral Daily  . sodium chloride flush  3 mL Intravenous Q12H  . Warfarin - Pharmacist Dosing Inpatient   Does not apply q1800   Continuous Infusions: . heparin 800 Units/hr (10/03/16 1552)   PRN Meds: sodium chloride, acetaminophen, metoprolol, nitroGLYCERIN, ondansetron (ZOFRAN) IV, polyethylene glycol, sodium chloride flush   Vital Signs    Vitals:   10/04/16 1420 10/04/16 2108 10/04/16 2112 10/05/16 0430  BP: 115/72 109/67 109/67 98/61  Pulse: 83 80 72 69  Resp: 16     Temp: 97.8 F (36.6 C)  97.8 F (36.6 C) 98.1 F (36.7 C)  TempSrc: Oral  Oral Oral  SpO2: 99%  100% 100%  Weight:    132 lb 1.6 oz (59.9 kg)  Height:        Intake/Output Summary (Last 24 hours) at 10/05/16 0932 Last data filed at 10/05/16 0900  Gross per 24 hour  Intake             1484 ml  Output             2000 ml  Net             -516 ml   Filed Weights   10/03/16 0557 10/04/16 0432 10/05/16 0430  Weight: 130 lb 3.2 oz (59.1 kg) 130 lb 14.4 oz (59.4 kg) 132 lb 1.6 oz (59.9 kg)    Physical Exam    GEN: Well nourished,  in no acute  distress.  HEENT: normocephalic, sclera clear, mucus membranes moist.  Neck: Supple, mild JVD . Cardiac: iRRR, irreg no murmurs, rubs, or gallops. No clubbing, cyanosis, edema.  Radials/DP/PT 2+ and equal bilaterally.  Respiratory:  Respirations regular and unlabored, clear to auscultation bilaterally without rales, rhonchi or wheezes. GI: Abd -Soft, nontender, nondistended, BS + x 4. MS: no deformity or atrophy. Skin: warm and dry, brisk capillary refill, no obvious rash Neuro:  Alert and oriented X 3 MAE, follows commands Psych: answers questions appropriately,Normal and pleasant affect.   Labs    CBC  Recent Labs  10/04/16 0455 10/05/16 0329  WBC 7.0 7.2  HGB 10.2* 10.0*  HCT 30.9* 30.0*  MCV 82.4 82.2  PLT 296 [id number]   Basic Metabolic Panel  Recent Labs  10/04/16 0455 10/05/16 0329  NA 131* 131*  K 4.4 4.3  CL 101 100*  CO2 22 22  GLUCOSE 100* 94  BUN 21* 22*  CREATININE 1.07* 1.09*  CALCIUM 9.7 9.7   Liver Function Tests No results for input(s): AST, ALT, ALKPHOS, BILITOT,  PROT, ALBUMIN in the last 72 hours. No results for input(s): LIPASE, AMYLASE in the last 72 hours. Cardiac Enzymes No results for input(s): CKTOTAL, CKMB, CKMBINDEX, TROPONINI in the last 72 hours. BNP Invalid input(s): POCBNP D-Dimer No results for input(s): DDIMER in the last 72 hours. Hemoglobin A1C No results for input(s): HGBA1C in the last 72 hours. Fasting Lipid Panel No results for input(s): CHOL, HDL, LDLCALC, TRIG, CHOLHDL, LDLDIRECT in the last 72 hours. Thyroid Function Tests No results for input(s): TSH, T4TOTAL, T3FREE, THYROIDAB in the last 72 hours.  Invalid input(s): FREET3  Telemetry    A fib with paced beats, some tachycardia - Personally Reviewed  ECG    A fib pacing on the 12th - Personally Reviewed  Radiology    No results found.  Cardiac Studies   LHC: 09/30/16  Conclusion     There is mild left ventricular systolic dysfunction.  LV end  diastolic pressure is normal.  The left ventricular ejection fraction is 45-50% by visual estimate.  There is no mitral valve regurgitation.  A drug eluting stent was successfully placed.  Ost RCA lesion, 90 %stenosed.  Post intervention, there is a 10% residual stenosis.  1. Severe one-vessel coronary artery disease affecting the ostial right coronary artery with 90% stenosis. Severe pressure dampening with catheter engagement. 2. Mildly reduced LV systolic function with global hypokinesis. Mildly elevated left ventricular end-diastolic pressure. 3. Successful angioplasty and drug-eluting stent placement to the ostial right coronary artery. 4. Moderately difficult catheterization via the right radial artery due to tortuous innominate artery.  Recommendations: Resume heparin in 8 hours after sheath will given that the patient has atrial fibrillation and mechanical mitral valve. Warfarin will be resumed tonight as well. In terms of antiplatelet therapy, I recommend aspirin and Plavix until INR is therapeutic. After that, aspirin can be discontinued. The patient can continue on warfarin and Plavix monotherapy for one year. Recommend aggressive treatment of risk factors.   TTE: 10/01/16  Study Conclusions  - Left ventricle: Systolic function was normal. The estimated ejection fraction was in the range of 50% to 55%. Wall motion was normal; there were no regional wall motion abnormalities. Mitral valve prosthesis and atrial fibrillation prevents evaluation of LV diastolic function. - Ventricular septum: Septal motion showed paradox. The contour showed diastolic flattening. These changes are consistent with RV volume overload. - Aortic valve: Right coronary cusp mobility was moderately restricted. There was trivial regurgitation. - Mitral valve: A mechanical prosthesis was present and functioning normally. - Left atrium: The atrium was severely dilated. - Right  ventricle: The cavity size was moderately dilated. Systolic function was mildly to moderately reduced. - Right atrium: The atrium was severely dilated. - Atrial septum: No defect or patent foramen ovale was identified. - Tricuspid valve: There was severe regurgitation. - Pulmonary arteries: Systolic pressure was moderately increased. PA peak pressure: 55 mm Hg (S).   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 Trop peaked at 5. Underwent LHC showing lesion in the RCA treated with DES.    Assessment & Plan    1. NSTEMI:Present with chest pain, n/v that started yesterday while she was having a BM.  Chest pain came first, then became nauseated and vomited. Reports this was similar to the pain she had back in 6/17 when she underwent cath. Cath at that time showed non-obstructive CAD. Was given nitro with improvement in symptoms. Trop peaked at 5.  -- Underwent LHC showing severe one vessel disease in the RCA with 90% stenosis with successful angioplasty with DES. Plan to continue on ASA, Plavix and coumadin until INR is therapeutic, then DC ASA. Continue to warfarin and plavix for one year.  -- Echo showed EF of 50-55% with no WMA, MV with normal function.    - ambulate and plan for discharge tomorrow  2. AF: On diltiazem 120mg  daily at home for rate control. This was stopped on admission given her mildly reduced EF and started on metoprolol. Rate is generally controlled. Check with ambulation -- coumadin was restarted, INR 1.89 this morning. Hgb stable at 10. In review of notes and talking  with pharmacy, INR range should be 2-2.5 given hx of severe bleeding  complications.   3. Combined HF: on IV lasix,  Now with decreased Na and increased Cr. Change to lasix 40 po daily.  Last Echo showed EF of 45-50%.   4. Valvular disease:s/p MVR, known moderate/severe TR. Appears to be volume stable on exam. MVR stable on echo this admission.  INR 1.89 has not yet been therapeutic GOAL 2-2.5 due to hx of severe bleeding and now on plavix as well  5. [id number].  Signed, Cecilie Kicks, NP  10/05/2016, 9:32 AM  [place] Pager [phone]  After 5 or weekends [phone]  Agree with note written by Cecilie Kicks RNP  Admitted with NSTEMI. Trop increased to 5. Cath showed tight ostial RCA S/P PCI/DES. Nl LV fxn by 2D (EF 50-55%). H/O CAF and mechanical MVR on coumadin AC. Currently being re anticoag IV hep---> Coumadin. INR 1.89. Plan low therapeutic INR. Can DC ASA once INR therapeutic. She was on PO Cardizem as OP which has been D/Cd. HR increases to 140 range with ambulation. May need to restart. I/O neg 5L on PO lasix. Home next 24-48 hours.  Quay Burow 10/05/2016 10:05 AM

## 2016-10-05 NOTE — Progress Notes (Signed)
ANTICOAGULATION CONSULT NOTE  Pharmacy Consult:  Heparin / warfarin Indication: atrial fibrillation  Patient Measurements: Height: 5\' 1"  (154.9 cm) Weight: 132 lb 1.6 oz (59.9 kg) IBW/kg (Calculated) : 47.8 Heparin Dosing Weight: 52 kg  Vital Signs: Temp: 98.1 F (36.7 C) (01/15 0430) Temp Source: Oral (01/15 0430) BP: 98/61 (01/15 0430) Pulse Rate: 69 (01/15 0430)  Labs:  Recent Labs  10/03/16 0602 10/04/16 0455 10/05/16 0329  HGB 10.3* 10.2* 10.0*  HCT 30.8* 30.9* 30.0*  PLT 215 296 235  LABPROT 22.0* 22.6* 22.0*  INR 1.89 1.96 1.89  HEPARINUNFRC 0.63 0.61 0.53  CREATININE 1.01* 1.07* 1.09*    Estimated Creatinine Clearance: 37.6 mL/min (by C-G formula based on SCr of 1.09 mg/dL (H)).   Medical History: Past Medical History:  Diagnosis Date  . Anticoagulated on Coumadin    for mech valve and atrial fib  goal 2.0-2.5  . Anxiety   . Arthritis    "right shoulder" (03/15/2013)  . Atrial fibrillation ([place])   . Atrial flutter ([place])   . CAD (coronary artery disease) 08/11/2016  . CHF (congestive heart failure) ([place])   . Chronic combined systolic and diastolic CHF (congestive heart failure) ([place])    a. 02/2016 Echo: EF 45-50%.  . Diverticula of colon   . Exertional shortness of breath   . GERD (gastroesophageal reflux disease)   . Heart murmur   . Hemorrhage intraabdominal 03/17/2012  . History of blood transfusion    "once" (03/15/2013)  . Hyperlipidemia   . Hypertension   . Liver hemorrhage   . Migraines   . 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 Bi-leaflet St. Jude mechanical prosthesis; b. 02/2016 Echo: EF 45-50%, some degree of MR, sev dil LA/RA, sev TR.  . NSTEMI (non-ST elevated myocardial infarction) ([place])    a. 02/2016 elev trop/Cath: nonobs dzs,   . Pacemaker    medtronic adapta  . Pneumonia 2009   resolved.? OPD Rx  . Severe tricuspid regurgitation    a. 02/2016 Echo: Ef 45-50%, sev TR, PASP 57mmHg.  . Sick sinus syndrome ([place])    Dr Cristopher Peru. EP study negative. Pacemaker 12/15/06 Medtronic  . Stroke [facility])    "they say I had a stroke last year" denies residual on 03/15/2013  . Subdural hematoma (HCC)     Assessment: Carrie [name] on warfarin PTA for h/o Afib and mechanical mitral valve. Patient presented with chest pain and elevated troponin now s/p cath. Pharmacy consulted to re-start warfarin with heparin bridge - INR remains slightly subtherapeutic at 1.89 today despite increased doses this admit. Heparin remains level therapeutic at 0.53 on 800 units/hr. CBC stable, no bleeding documented.  Of note, lower INR goal due to h/o liver hemorrhage and subdural hematoma in 2013 per anticoagulation clinic notes - MD aware.  PTA warfarin regimen: 5mg  daily except 7.5mg  on Mon/Fri  Goal of Therapy:  Heparin level 0.3-0.7 units/mL INR 2-2.5 per [facility] clinic note Monitor platelets by anticoagulation protocol: Yes    Plan:  - Continue heparin at 800 units/hr - d/c when INR therapeutic >24h - Warfarin 7.5 mg PO x 1 tonight - Daily heparin level, INR, and CBC - Monitor for s/sx of bleeding   Elicia Lamp, PharmD, BCPS Clinical Pharmacist 10/05/2016 10:16 AM

## 2016-10-05 NOTE — Progress Notes (Signed)
CARDIAC REHAB PHASE I   PRE:  Rate/Rhythm: 77  a fib  BP:  Sitting: 122/71        SaO2: 95 RA  MODE:  Ambulation: 500 ft   POST:  Rate/Rhythm: 155   BP:  Sitting: 113/80         SaO2: 98 RA  Pt ambulated 500 ft on RA, IV, assist x1, steady gait, tolerated fairly well. Pt HR elevated during ambulation, c/o some DOE, standing rest x1, HR and dyspnea improved with rest. MD and RN aware. Pt to edge of bed per pt request after walk, call bell within reach. Will follow.  [id number] Lenna Sciara, RN, BSN 10/05/2016 10:10 AM

## 2016-10-06 LAB — BASIC METABOLIC PANEL
ANION GAP: 7 (ref 5–15)
BUN: 22 mg/dL — AB (ref 6–20)
CO2: 21 mmol/L — ABNORMAL LOW (ref 22–32)
Calcium: 9.5 mg/dL (ref 8.9–10.3)
Chloride: 102 mmol/L (ref 101–111)
Creatinine, Ser: 0.9 mg/dL (ref 0.44–1.00)
Glucose, Bld: 97 mg/dL (ref 65–99)
POTASSIUM: 4.7 mmol/L (ref 3.5–5.1)
SODIUM: 130 mmol/L — AB (ref 135–145)

## 2016-10-06 LAB — CBC
HCT: 27 % — ABNORMAL LOW (ref 36.0–46.0)
HEMOGLOBIN: 9.2 g/dL — AB (ref 12.0–15.0)
MCH: 27.9 pg (ref 26.0–34.0)
MCHC: 34.1 g/dL (ref 30.0–36.0)
MCV: 81.8 fL (ref 78.0–100.0)
Platelets: 205 10*3/uL (ref 150–400)
RBC: 3.3 MIL/uL — AB (ref 3.87–5.11)
RDW: 22 % — ABNORMAL HIGH (ref 11.5–15.5)
WBC: 7.3 10*3/uL (ref 4.0–10.5)

## 2016-10-06 LAB — PROTIME-INR
INR: 1.7
PROTHROMBIN TIME: 20.2 s — AB (ref 11.4–15.2)

## 2016-10-06 LAB — HEPARIN LEVEL (UNFRACTIONATED): HEPARIN UNFRACTIONATED: 0.52 [IU]/mL (ref 0.30–0.70)

## 2016-10-06 MED ORDER — DILTIAZEM HCL ER COATED BEADS 120 MG PO CP24
120.0000 mg | ORAL_CAPSULE | Freq: Every day | ORAL | Status: DC
  Administered 2016-10-06 – 2016-10-08 (×3): 120 mg via ORAL
  Filled 2016-10-06 (×2): qty 1

## 2016-10-06 MED ORDER — WARFARIN SODIUM 10 MG PO TABS
10.0000 mg | ORAL_TABLET | Freq: Once | ORAL | Status: AC
  Administered 2016-10-06: 10 mg via ORAL
  Filled 2016-10-06: qty 1

## 2016-10-06 NOTE — Progress Notes (Signed)
Pt has had burst of Afib RVR this afternoon with her heart rate going as high as 140 while patient resting in chair. Pt asymptomatic. Patient's morning dose of Cardizem and lopressor were held this morning because pt's SBP was in the 90's. ( Med's held per ordered parameters). This evening patient's daily Cardizem was given and pt's HR is now in the 80's AFIB. Will cont to monitor pt.

## 2016-10-06 NOTE — Care Management Important Message (Signed)
Important Message  Patient Details  Name: Carrie [name] MRN: [id number] Date of Birth: 11-Apr-1941   Medicare Important Message Given:  Yes    Nathen May 10/06/2016, 12:37 PM

## 2016-10-06 NOTE — Progress Notes (Signed)
CARDIAC REHAB PHASE I   PRE:  Rate/Rhythm: 90 afib  BP:  Supine:   Sitting: 122/67  Standing:    SaO2: 98%RA  MODE:  Ambulation: 550 ft   POST:  Rate/Rhythm: 150 afib with few paced beats    117 with rest  BP:  Supine:   Sitting: 141/83  Standing:    SaO2: 99%RA 1107-1125 Pt walked 550 ft on RA with minimal asst. Tolerated walk well even with elevated heart rate. Pt could tell when heart to 150 but not when 120s. No CP. Dr Gwenlyn Found aware of elevated HR with walk.    Graylon Good, RN BSN  10/06/2016 11:42 AM

## 2016-10-06 NOTE — Progress Notes (Signed)
ANTICOAGULATION CONSULT NOTE  Pharmacy Consult:  Heparin / warfarin Indication: atrial fibrillation  Patient Measurements: Height: 5\' 1"  (154.9 cm) Weight: 132 lb 9.6 oz (60.1 kg) IBW/kg (Calculated) : 47.8 Heparin Dosing Weight: 52 kg  Vital Signs: Temp: 98 F (36.7 C) (01/16 0445) Temp Source: Oral (01/16 0445) BP: 104/68 (01/16 0445) Pulse Rate: 75 (01/15 2114)  Labs:  Recent Labs  10/04/16 0455 10/05/16 0329 10/06/16 0322  HGB 10.2* 10.0* 9.2*  HCT 30.9* 30.0* 27.0*  PLT 296 235 205  LABPROT 22.6* 22.0* 20.2*  INR 1.96 1.89 1.70  HEPARINUNFRC 0.61 0.53 0.52  CREATININE 1.07* 1.09* 0.90    Estimated Creatinine Clearance: 45.6 mL/min (by C-G formula based on SCr of 0.9 mg/dL).   Medical History: Past Medical History:  Diagnosis Date  . Anticoagulated on Coumadin    for mech valve and atrial fib  goal 2.0-2.5  . Anxiety   . Arthritis    "right shoulder" (03/15/2013)  . Atrial fibrillation ([place])   . Atrial flutter ([place])   . CAD (coronary artery disease) 08/11/2016  . CHF (congestive heart failure) ([place])   . Chronic combined systolic and diastolic CHF (congestive heart failure) ([place])    a. 02/2016 Echo: EF 45-50%.  . Diverticula of colon   . Exertional shortness of breath   . GERD (gastroesophageal reflux disease)   . Heart murmur   . Hemorrhage intraabdominal 03/17/2012  . History of blood transfusion    "once" (03/15/2013)  . Hyperlipidemia   . Hypertension   . Liver hemorrhage   . Migraines   . 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 Bi-leaflet St. Jude mechanical prosthesis; b. 02/2016 Echo: EF 45-50%, some degree of MR, sev dil LA/RA, sev TR.  . NSTEMI (non-ST elevated myocardial infarction) ([place])    a. 02/2016 elev trop/Cath: nonobs dzs,   . Pacemaker    medtronic adapta  . Pneumonia 2009   resolved.? OPD Rx  . Severe tricuspid regurgitation    a. 02/2016 Echo: Ef 45-50%, sev TR, PASP 87mmHg.  . Sick sinus syndrome ([place])    Dr Cristopher Peru. EP study negative. Pacemaker 12/15/06 Medtronic  . Stroke [facility])    "they say I had a stroke last year" denies residual on 03/15/2013  . Subdural hematoma (HCC)     Assessment: Carrie [name] on warfarin PTA for h/o Afib and mechanical mitral valve. Patient presented with chest pain and elevated troponin now s/p cath. Pharmacy consulted to re-start warfarin with heparin bridge - INR remains subtherapeutic, decreased at 1.7 this AM despite increased doses this admit. Heparin remains level therapeutic at 0.52 on 800 units/hr. CBC stable, no bleeding documented.  Of note, lower INR goal due to h/o liver hemorrhage and subdural hematoma in 2013 per anticoagulation clinic notes - MD aware.  PTA warfarin regimen: 5mg  daily except 7.5mg  on Mon/Fri  Goal of Therapy:  Heparin level 0.3-0.7 units/mL INR 2-2.5 per [facility] clinic note Monitor platelets by anticoagulation protocol: Yes    Plan:  - Continue heparin at 800 units/hr - d/c when INR therapeutic >24h - Warfarin 10 mg PO x 1 tonight - Daily heparin level, INR, and CBC - Monitor for s/sx of bleeding   Elicia Lamp, PharmD, BCPS Clinical Pharmacist 10/06/2016 8:34 AM

## 2016-10-06 NOTE — Progress Notes (Signed)
Patient Name: Carrie [name] Date of Encounter: 10/06/2016  Primary Cardiologist: Dr. [facility] Problem List     Principal Problem:   NSTEMI (non-ST elevated myocardial infarction) [facility]) Active Problems:   Chest pain   Hypertensive heart disease with heart failure ([place])   Pure hypercholesterolemia   H/O mitral valve replacement with mechanical valve   Paroxysmal atrial fibrillation ([place])   Status post coronary artery stent placement   Acute on chronic diastolic heart failure ([place])     Subjective   Feels great, wants to go home. Denies chest pain and SOB.   Inpatient Medications    Scheduled Meds: . aspirin EC  81 mg Oral Daily  . atorvastatin  40 mg Oral q1800  . clopidogrel  75 mg Oral Q breakfast  . diltiazem  120 mg Oral Daily  . docusate sodium  200 mg Oral TID  . furosemide  40 mg Oral Daily  . metoprolol tartrate  25 mg Oral BID  . potassium chloride SA  40 mEq Oral Daily  . sodium chloride flush  3 mL Intravenous Q12H  . warfarin  10 mg Oral ONCE-1800  . Warfarin - Pharmacist Dosing Inpatient   Does not apply q1800   Continuous Infusions: . heparin 800 Units/hr (10/06/16 0722)   PRN Meds: sodium chloride, acetaminophen, metoprolol, nitroGLYCERIN, ondansetron (ZOFRAN) IV, polyethylene glycol, sodium chloride flush   Vital Signs    Vitals:   10/05/16 2114 10/05/16 2115 10/06/16 0444 10/06/16 0445  BP:  115/76  104/68  Pulse: 75     Resp:  14  14  Temp:  98.2 F (36.8 C)  98 F (36.7 C)  TempSrc:  Oral  Oral  SpO2:  98%  97%  Weight:   132 lb 9.6 oz (60.1 kg)   Height:        Intake/Output Summary (Last 24 hours) at 10/06/16 0839 Last data filed at 10/06/16 0700  Gross per 24 hour  Intake              960 ml  Output             2200 ml  Net            -1240 ml   Filed Weights   10/04/16 0432 10/05/16 0430 10/06/16 0444  Weight: 130 lb 14.4 oz (59.4 kg) 132 lb 1.6 oz (59.9 kg) 132 lb 9.6 oz (60.1 kg)    Physical Exam    GEN:  Well nourished, well developed, in no acute distress.  HEENT: Grossly normal.  Neck: Supple, no JVD, carotid bruits, or masses. Cardiac: irregularly irregular rhythm, no murmurs, rubs, or gallops. No clubbing, cyanosis, edema.  Radials/DP/PT 2+ and equal bilaterally.  Respiratory:  Respirations regular and unlabored, clear to auscultation bilaterally. GI: Soft, nontender, nondistended, BS + x 4. MS: no deformity or atrophy. Skin: warm and dry, no rash. Neuro:  Strength and sensation are intact. Psych: AAOx3.  Normal affect.  Labs    CBC  Recent Labs  10/05/16 0329 10/06/16 0322  WBC 7.2 7.3  HGB 10.0* 9.2*  HCT 30.0* 27.0*  MCV 82.2 81.8  PLT 235 [id number]   Basic Metabolic Panel  Recent Labs  10/05/16 0329 10/06/16 0322  NA 131* 130*  K 4.3 4.7  CL 100* 102  CO2 22 21*  GLUCOSE 94 97  BUN 22* 22*  CREATININE 1.09* 0.90  CALCIUM 9.7 9.5     Telemetry    Afib, rate controlled  -  Personally Reviewed  ECG    Afib, LVH- Personally Reviewed  Radiology    No results found.  Cardiac Studies   LHC: 09/30/16  Conclusion     There is mild left ventricular systolic dysfunction.  LV end diastolic pressure is normal.  The left ventricular ejection fraction is 45-50% by visual estimate.  There is no mitral valve regurgitation.  A drug eluting stent was successfully placed.  Ost RCA lesion, 90 %stenosed.  Post intervention, there is a 10% residual stenosis.  1. Severe one-vessel coronary artery disease affecting the ostial right coronary artery with 90% stenosis. Severe pressure dampening with catheter engagement. 2. Mildly reduced LV systolic function with global hypokinesis. Mildly elevated left ventricular end-diastolic pressure. 3. Successful angioplasty and drug-eluting stent placement to the ostial right coronary artery. 4. Moderately difficult catheterization via the right radial artery due to tortuous innominate artery.  Recommendations: Resume  heparin in 8 hours after sheath will given that the patient has atrial fibrillation and mechanical mitral valve. Warfarin will be resumed tonight as well. In terms of antiplatelet therapy, I recommend aspirin and Plavix until INR is therapeutic. After that, aspirin can be discontinued. The patient can continue on warfarin and Plavix monotherapy for one year. Recommend aggressive treatment of risk factors.   TTE: 10/01/16  Study Conclusions  - Left ventricle: Systolic function was normal. The estimated ejection fraction was in the range of 50% to 55%. Wall motion was normal; there were no regional wall motion abnormalities. Mitral valve prosthesis and atrial fibrillation prevents evaluation of LV diastolic function. - Ventricular septum: Septal motion showed paradox. The contour showed diastolic flattening. These changes are consistent with RV volume overload. - Aortic valve: Right coronary cusp mobility was moderately restricted. There was trivial regurgitation. - Mitral valve: A mechanical prosthesis was present and functioning normally. - Left atrium: The atrium was severely dilated. - Right ventricle: The cavity size was moderately dilated. Systolic function was mildly to moderately reduced. - Right atrium: The atrium was severely dilated. - Atrial septum: No defect or patent foramen ovale was identified. - Tricuspid valve: There was severe regurgitation. - Pulmonary arteries: Systolic pressure was moderately increased. PA peak pressure: 55 mm Hg (S).   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 Trop peaked at 5. Underwent LHC showing lesion in the RCA treated with DES.   Assessment & Plan   1. NSTEMI:Present with chest pain, n/v that started yesterday while she was having a BM. Chest pain came first, then became nauseated and vomited. Reports this was similar to the pain she had back in 6/17 when she underwent cath. Cath at that time showed non-obstructive CAD. Was given nitro with improvement in symptoms. Trop peaked at 5.   Underwent LHC showing severe one vessel disease in the RCA with 90% stenosis with successful angioplasty with DES. Plan to continue on ASA, Plavix and coumadin until INR is therapeutic, then DC ASA. Continue to warfarin and plavix for one year.   Echo showed EF of 50-55% with no WMA, MV with normal function.    2. AF: On diltiazem 120mg  daily at home for rate control. This was stopped on  admission given her mildly reduced EF and started on metoprolol. Rate is generally controlled.   Coumadin was restarted, INR 1.70this morning. Hgb stable at 10. In review of notes and talking with pharmacy, INR range should be 2-2.5 given hx of severe bleeding complications.   3. Combined HF: on IV lasix,  Now with decreased Na and increased Cr. Change to lasix 40 po daily.  Last Echo showed EF of 45-50%.   4. Valvular disease:s/p MVR, known moderate/severe TR. Appears to be volume stable on exam. MVR stable on echo this admission.  INR 1.89 has not yet been therapeutic GOAL 2-2.5 due to hx of severe bleeding and now on plavix as well  5. [id number].  Dispo: MD to advise. INR still subtherapeutic.   Signed, Arbutus Leas, NP  10/06/2016, 8:39 AM   Agree with note by [name] NP  On IV hep---> Coumadin AC. INR 1.7. Pharm adjusting. HR response to exercise still elevated. Cardizem added back. Home when INR therapeutic. Exam benign.   Lorretta Harp, M.D., [place], [facility], [place] [address]. [place], [place]  [address]  [phone] 10/06/2016 11:22 AM

## 2016-10-07 LAB — BASIC METABOLIC PANEL
Anion gap: 7 (ref 5–15)
BUN: 22 mg/dL — AB (ref 6–20)
CALCIUM: 9.7 mg/dL (ref 8.9–10.3)
CO2: 21 mmol/L — AB (ref 22–32)
Chloride: 105 mmol/L (ref 101–111)
Creatinine, Ser: 1.08 mg/dL — ABNORMAL HIGH (ref 0.44–1.00)
GFR calc Af Amer: 57 mL/min — ABNORMAL LOW (ref >60)
GFR, EST NON AFRICAN AMERICAN: 49 mL/min — AB (ref >60)
GLUCOSE: 98 mg/dL (ref 65–99)
Potassium: 5.1 mmol/L (ref 3.5–5.1)
Sodium: 133 mmol/L — ABNORMAL LOW (ref 135–145)

## 2016-10-07 LAB — CBC
HCT: 26 % — ABNORMAL LOW (ref 36.0–46.0)
Hemoglobin: 8.7 g/dL — ABNORMAL LOW (ref 12.0–15.0)
MCH: 27.6 pg (ref 26.0–34.0)
MCHC: 33.5 g/dL (ref 30.0–36.0)
MCV: 82.5 fL (ref 78.0–100.0)
Platelets: 211 10*3/uL (ref 150–400)
RBC: 3.15 MIL/uL — ABNORMAL LOW (ref 3.87–5.11)
RDW: 23 % — ABNORMAL HIGH (ref 11.5–15.5)
WBC: 7.3 10*3/uL (ref 4.0–10.5)

## 2016-10-07 LAB — PROTIME-INR
INR: 1.84
PROTHROMBIN TIME: 21.5 s — AB (ref 11.4–15.2)

## 2016-10-07 LAB — HEPARIN LEVEL (UNFRACTIONATED): HEPARIN UNFRACTIONATED: 0.53 [IU]/mL (ref 0.30–0.70)

## 2016-10-07 MED ORDER — WARFARIN SODIUM 10 MG PO TABS
10.0000 mg | ORAL_TABLET | Freq: Once | ORAL | Status: AC
  Administered 2016-10-07: 10 mg via ORAL
  Filled 2016-10-07: qty 1

## 2016-10-07 NOTE — Progress Notes (Signed)
CARDIAC REHAB PHASE I   PRE:  Rate/Rhythm: 63 a fib, occasional paced beats  BP:  Sitting: 113/63        SaO2: 94 RA  MODE:  Ambulation: 550 ft   POST:  Rate/Rhythm: 103 paced  BP:  Sitting: 125/64         SaO2: 100 RA  Pt ambulated 550 ft on RA, IV, hand held assist, mostly steady gait (pt does have occasional mild loss of balance), denies any complaints. Pt HR improved today with ambulation. Pt appreciative of walk, to recliner after walk, call bell within reach. Will follow.   [id number] Lenna Sciara, RN, BSN 10/07/2016 10:22 AM

## 2016-10-07 NOTE — Progress Notes (Signed)
Subjective:  HR better after restarting PO cardizem. No CP/SOB  Objective:  Temp:  [98.2 F (36.8 C)-98.5 F (36.9 C)] 98.5 F (36.9 C) (01/17 0521) Pulse Rate:  [67-83] 76 (01/17 0829) Resp:  [16-18] 16 (01/16 2105) BP: (95-133)/(53-100) 109/66 (01/17 0826) SpO2:  [98 %-100 %] 98 % (01/17 0521) Weight:  [134 lb 1.6 oz (60.8 kg)] 134 lb 1.6 oz (60.8 kg) (01/17 0521) Weight change: 1 lb 8 oz (0.68 kg)  Intake/Output from previous day: 01/16 0701 - 01/17 0700 In: 840 [P.O.:840] Out: 2025 [Urine:2025]  Intake/Output from this shift: Total I/O In: 240 [P.O.:240] Out: -   Physical Exam: General appearance: alert and no distress Neck: no adenopathy, no carotid bruit, no JVD, supple, symmetrical, trachea midline and thyroid not enlarged, symmetric, no tenderness/mass/nodules Lungs: clear to auscultation bilaterally Heart: irregularly irregular rhythm Extremities: extremities normal, atraumatic, no cyanosis or edema  Lab Results: Results for orders placed or performed during the hospital encounter of 09/29/16 (from the past 48 hour(s))  CBC     Status: Abnormal   Collection Time: 10/06/16  3:22 AM  Result Value Ref Range   WBC 7.3 4.0 - 10.5 K/uL   RBC 3.30 (L) 3.87 - 5.11 MIL/uL   Hemoglobin 9.2 (L) 12.0 - 15.0 g/dL   HCT 27.0 (L) 36.0 - 46.0 %   MCV 81.8 78.0 - 100.0 fL   MCH 27.9 26.0 - 34.0 pg   MCHC 34.1 30.0 - 36.0 g/dL   RDW 22.0 (H) 11.5 - 15.5 %   Platelets 205 150 - 400 K/uL  Heparin level (unfractionated)     Status: None   Collection Time: 10/06/16  3:22 AM  Result Value Ref Range   Heparin Unfractionated 0.52 0.30 - 0.70 IU/mL    Comment:        IF HEPARIN RESULTS ARE BELOW EXPECTED VALUES, AND PATIENT DOSAGE HAS BEEN CONFIRMED, SUGGEST FOLLOW UP TESTING OF ANTITHROMBIN III LEVELS.   Basic metabolic panel     Status: Abnormal   Collection Time: 10/06/16  3:22 AM  Result Value Ref Range   Sodium 130 (L) 135 - 145 mmol/L   Potassium 4.7 3.5 -  5.1 mmol/L   Chloride 102 101 - 111 mmol/L   CO2 21 (L) 22 - 32 mmol/L   Glucose, Bld 97 65 - 99 mg/dL   BUN 22 (H) 6 - 20 mg/dL   Creatinine, Ser 0.90 0.44 - 1.00 mg/dL   Calcium 9.5 8.9 - 10.3 mg/dL   GFR calc non Af Amer >60 >60 mL/min   GFR calc Af Amer >60 >60 mL/min    Comment: (NOTE) The eGFR has been calculated using the CKD EPI equation. This calculation has not been validated in all clinical situations. eGFR's persistently <60 mL/min signify possible Chronic Kidney Disease.    Anion gap 7 5 - 15  Protime-INR     Status: Abnormal   Collection Time: 10/06/16  3:22 AM  Result Value Ref Range   Prothrombin Time 20.2 (H) 11.4 - 15.2 seconds   INR 1.70   CBC     Status: Abnormal   Collection Time: 10/07/16  5:00 AM  Result Value Ref Range   WBC 7.3 4.0 - 10.5 K/uL   RBC 3.15 (L) 3.87 - 5.11 MIL/uL   Hemoglobin 8.7 (L) 12.0 - 15.0 g/dL   HCT 26.0 (L) 36.0 - 46.0 %   MCV 82.5 78.0 - 100.0 fL   MCH 27.6 26.0 - 34.0  pg   MCHC 33.5 30.0 - 36.0 g/dL   RDW 23.0 (H) 11.5 - 15.5 %   Platelets 211 150 - 400 K/uL  Protime-INR     Status: Abnormal   Collection Time: 10/07/16  5:00 AM  Result Value Ref Range   Prothrombin Time 21.5 (H) 11.4 - 15.2 seconds   INR 1.84   Heparin level (unfractionated)     Status: None   Collection Time: 10/07/16  5:00 AM  Result Value Ref Range   Heparin Unfractionated 0.53 0.30 - 0.70 IU/mL    Comment:        IF HEPARIN RESULTS ARE BELOW EXPECTED VALUES, AND PATIENT DOSAGE HAS BEEN CONFIRMED, SUGGEST FOLLOW UP TESTING OF ANTITHROMBIN III LEVELS.   Basic metabolic panel     Status: Abnormal   Collection Time: 10/07/16  5:00 AM  Result Value Ref Range   Sodium 133 (L) 135 - 145 mmol/L   Potassium 5.1 3.5 - 5.1 mmol/L   Chloride 105 101 - 111 mmol/L   CO2 21 (L) 22 - 32 mmol/L   Glucose, Bld 98 65 - 99 mg/dL   BUN 22 (H) 6 - 20 mg/dL   Creatinine, Ser 1.08 (H) 0.44 - 1.00 mg/dL   Calcium 9.7 8.9 - 10.3 mg/dL   GFR calc non Af Amer 49 (L)  >60 mL/min   GFR calc Af Amer 57 (L) >60 mL/min    Comment: (NOTE) The eGFR has been calculated using the CKD EPI equation. This calculation has not been validated in all clinical situations. eGFR's persistently <60 mL/min signify possible Chronic Kidney Disease.    Anion gap 7 5 - 15    Imaging: Imaging results have been reviewed  Tele- AF with VR in 70-80s  Assessment/Plan:   1. Principal Problem: 2.   NSTEMI (non-ST elevated myocardial infarction) ([place]) 3. Active Problems: 4.   Chest pain 5.   Hypertensive heart disease with heart failure ([place]) 6.   Pure hypercholesterolemia 7.   H/O mitral valve replacement with mechanical valve 8.   Paroxysmal atrial fibrillation ([facility]) 9.   Status post coronary artery stent placement 10.   Acute on chronic diastolic heart failure ([place]) 11.   Time Spent Directly with Patient:  20 minutes  Length of Stay:  LOS: 8 days   Clinically improved after re starting PO Cardizem. On BB as well. On IV hep---> Coum AC. INR only 1.84. Pharm managing Coum dosing. Plan DC home once INR between 2-2.5. On DAPT as well with recent RCA stent.   Quay Burow 10/07/2016, 10:28 AM

## 2016-10-07 NOTE — Progress Notes (Signed)
ANTICOAGULATION CONSULT NOTE  Pharmacy Consult:  Heparin / warfarin Indication: atrial fibrillation  Patient Measurements: Height: 5\' 1"  (154.9 cm) Weight: 134 lb 1.6 oz (60.8 kg) IBW/kg (Calculated) : 47.8 Heparin Dosing Weight: 52 kg  Vital Signs: Temp: 98.5 F (36.9 C) (01/17 0521) Temp Source: Oral (01/17 0521) BP: 95/57 (01/17 0521) Pulse Rate: 67 (01/17 0521)  Labs:  Recent Labs  10/05/16 0329 10/06/16 0322 10/07/16 0500  HGB 10.0* 9.2* 8.7*  HCT 30.0* 27.0* 26.0*  PLT 235 205 211  LABPROT 22.0* 20.2* 21.5*  INR 1.89 1.70 1.84  HEPARINUNFRC 0.53 0.52 0.53  CREATININE 1.09* 0.90 1.08*    Estimated Creatinine Clearance: 38.2 mL/min (by C-G formula based on SCr of 1.08 mg/dL (H)).   Medical History: Past Medical History:  Diagnosis Date  . Anticoagulated on Coumadin    for mech valve and atrial fib  goal 2.0-2.5  . Anxiety   . Arthritis    "right shoulder" (03/15/2013)  . Atrial fibrillation ([place])   . Atrial flutter ([place])   . CAD (coronary artery disease) 08/11/2016  . CHF (congestive heart failure) ([place])   . Chronic combined systolic and diastolic CHF (congestive heart failure) ([place])    a. 02/2016 Echo: EF 45-50%.  . Diverticula of colon   . Exertional shortness of breath   . GERD (gastroesophageal reflux disease)   . Heart murmur   . Hemorrhage intraabdominal 03/17/2012  . History of blood transfusion    "once" (03/15/2013)  . Hyperlipidemia   . Hypertension   . Liver hemorrhage   . Migraines   . 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 Bi-leaflet St. Jude mechanical prosthesis; b. 02/2016 Echo: EF 45-50%, some degree of MR, sev dil LA/RA, sev TR.  . NSTEMI (non-ST elevated myocardial infarction) ([place])    a. 02/2016 elev trop/Cath: nonobs dzs,   . Pacemaker    medtronic adapta  . Pneumonia 2009   resolved.? OPD Rx  . Severe tricuspid regurgitation    a. 02/2016 Echo: Ef 45-50%, sev TR, PASP 28mmHg.  . Sick sinus syndrome ([place])    Dr  Cristopher [place]. EP study negative. Pacemaker 12/15/06 Medtronic  . Stroke [facility])    "they say I had a stroke last year" denies residual on 03/15/2013  . Subdural hematoma ([facility])     Assessment: 75 YOF on warfarin PTA for h/o Afib and mechanical mitral valve. Patient presented with chest pain and elevated troponin now s/p cath. Pharmacy consulted to re-start warfarin with heparin bridge - INR remains subtherapeutic, increased to 1.84 after further increased dose last night. Heparin remains level therapeutic at 0.53 on 800 units/hr. CBC stable, no bleeding documented.  Of note, lower INR goal due to h/o liver hemorrhage and subdural hematoma in 2013 per anticoagulation clinic notes - MD aware.  PTA warfarin regimen: 5mg  daily except 7.5mg  on Mon/Fri  Goal of Therapy:  Heparin level 0.3-0.7 units/mL INR 2-2.5 per [facility] clinic note Monitor platelets by anticoagulation protocol: Yes    Plan:  - Continue heparin at 800 units/hr - d/c when INR therapeutic >24h - Warfarin 10 mg PO x 1 tonight - Daily heparin level, INR, and CBC - Monitor for s/sx of bleeding   Elicia Lamp, PharmD, BCPS Clinical Pharmacist 10/07/2016 8:26 AM

## 2016-10-08 LAB — CBC
HEMATOCRIT: 26.2 % — AB (ref 36.0–46.0)
HEMOGLOBIN: 8.6 g/dL — AB (ref 12.0–15.0)
MCH: 27.3 pg (ref 26.0–34.0)
MCHC: 32.8 g/dL (ref 30.0–36.0)
MCV: 83.2 fL (ref 78.0–100.0)
Platelets: 255 10*3/uL (ref 150–400)
RBC: 3.15 MIL/uL — ABNORMAL LOW (ref 3.87–5.11)
RDW: 23.6 % — ABNORMAL HIGH (ref 11.5–15.5)
WBC: 7.2 10*3/uL (ref 4.0–10.5)

## 2016-10-08 LAB — PROTIME-INR
INR: 1.99
Prothrombin Time: 22.9 seconds — ABNORMAL HIGH (ref 11.4–15.2)

## 2016-10-08 LAB — HEPARIN LEVEL (UNFRACTIONATED): Heparin Unfractionated: 0.51 IU/mL (ref 0.30–0.70)

## 2016-10-08 MED ORDER — METOPROLOL TARTRATE 25 MG PO TABS: 25.0000 mg | ORAL_TABLET | Freq: Two times a day (BID) | ORAL | 12 refills | Status: DC

## 2016-10-08 MED ORDER — CLOPIDOGREL BISULFATE 75 MG PO TABS: 75.0000 mg | ORAL_TABLET | Freq: Every day | ORAL | 12 refills | Status: DC

## 2016-10-08 MED ORDER — NITROGLYCERIN 0.4 MG SL SUBL: 0.4000 mg | SUBLINGUAL_TABLET | SUBLINGUAL | 2 refills | Status: DC | PRN

## 2016-10-08 MED ORDER — ATORVASTATIN CALCIUM 40 MG PO TABS: 40.0000 mg | ORAL_TABLET | Freq: Every day | ORAL | 12 refills | Status: DC

## 2016-10-08 MED ORDER — WARFARIN SODIUM 5 MG PO TABS: 5.0000 mg | ORAL_TABLET | Freq: Once | ORAL | Status: DC

## 2016-10-08 MED ORDER — POTASSIUM CHLORIDE CRYS ER 20 MEQ PO TBCR: 40.0000 meq | EXTENDED_RELEASE_TABLET | Freq: Every day | ORAL | 3 refills | Status: DC

## 2016-10-08 NOTE — Progress Notes (Signed)
Pt called and stated hand was bleeding. Small amount of bleeding noted on right radial cath site. Pressure held for 7 minutes, guaze and tegarderm applied. Will continue to monitor. Pt on heparin drip, pharmacy notified of bleeding.

## 2016-10-08 NOTE — Discharge Summary (Signed)
Discharge Summary    Patient ID: Carrie [name],  MRN: [id number], DOB/AGE: [birth year] 75 y.o.  Admit date: 09/29/2016 Discharge date: 10/08/2016  Primary Care Provider: [id number] Primary Cardiologist: Dr. Sallyanne Kuster  Discharge Diagnoses    Principal Problem:   NSTEMI (non-ST elevated myocardial infarction) [facility]) Active Problems:   Chest pain   Hypertensive heart disease with heart failure ([place])   Pure hypercholesterolemia   H/O mitral valve replacement with mechanical valve   Paroxysmal atrial fibrillation ([place])   Status post coronary artery stent placement   Acute on chronic diastolic heart failure (HCC)   Allergies Allergies  Allergen Reactions  . Codeine Nausea And Vomiting    Diagnostic Studies/Procedures   Coronary Stent Intervention  Left Heart Cath and Coronary Angiography 09/30/16      There is mild left ventricular systolic dysfunction.  LV end diastolic pressure is normal.  The left ventricular ejection fraction is 45-50% by visual estimate.  There is no mitral valve regurgitation.  A drug eluting stent was successfully placed.  Ost RCA lesion, 90 %stenosed.  Post intervention, there is a 10% residual stenosis.   1. Severe one-vessel coronary artery disease affecting the ostial right coronary artery with 90% stenosis. Severe pressure dampening with catheter engagement. 2. Mildly reduced LV systolic function with global hypokinesis. Mildly elevated left ventricular end-diastolic pressure. 3. Successful angioplasty and drug-eluting stent placement to the ostial right coronary artery. 4. Moderately difficult catheterization via the right radial artery due to tortuous innominate artery.  Recommendations: Resume heparin in 8 hours after sheath will given that the patient has atrial fibrillation and mechanical mitral valve. Warfarin will be resumed tonight as well. In terms of antiplatelet therapy, I recommend aspirin and Plavix until INR is  therapeutic. After that, aspirin can be discontinued. The patient can continue on warfarin and Plavix monotherapy for one year. Recommend aggressive treatment of risk factors.     Transthoracic Echocardiography 10/01/16 Study Conclusions  - Left ventricle: Systolic function was normal. The estimated   ejection fraction was in the range of 50% to 55%. Wall motion was   normal; there were no regional wall motion abnormalities. Mitral   valve prosthesis and atrial fibrillation prevents evaluation of   LV diastolic function. - Ventricular septum: Septal motion showed paradox. The contour   showed diastolic flattening. These changes are consistent with RV   volume overload. - Aortic valve: Right coronary cusp mobility was moderately   restricted. There was trivial regurgitation. - Mitral valve: A mechanical prosthesis was present and functioning   normally. - Left atrium: The atrium was severely dilated. - Right ventricle: The cavity size was moderately dilated. Systolic   function was mildly to moderately reduced. - Right atrium: The atrium was severely dilated. - Atrial septum: No defect or patent foramen ovale was identified. - Tricuspid valve: There was severe regurgitation. - Pulmonary arteries: Systolic pressure was moderately increased.   PA peak pressure: 55 mm Hg (S).  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 Also with a history of HTN and diastolic CHF.   She presented with chest pain on 09/29/16 that was associated with nausea and vomiting.  Troponin was elevated consistent with NSTEMI.   Hospital Course     She  underwent left heart cath on 09/30/16 that showed a severe ostial RCA lesion this was treated with a DES. She will be on Plavix and warfarin. She was on ASA while admitted until her INR became therapeutic. She will go home on Plavix and Warfarin only, plan for about 1 year of therapy.   Her Echo showed preserved LVEF with 50-55%. Her left atrium was severely dilated consistent with her long standing atrial fibrillation.   Her Cardizem was stopped in light of her slightly reduced LV function, however her rates became elevated so it was restarted along with her already existing beta blocker.   She ambulated with cardiac rehab and did well. Her INR at discharge was 1.99, we will continue her home dose of Coumadin and will have her INR checked on Monday.   She was seen today by Dr. Gwenlyn Found and deemed suitable for discharge.  _____________  Discharge Vitals Blood pressure (!) 127/52, pulse 98, temperature 98.4 F (36.9 C), temperature source Oral, resp. rate 18, height 5\' 1"  (1.549 m), weight 131 lb 6.4 oz (59.6 kg), SpO2 99 %.  Filed Weights   10/06/16 0444 10/07/16 0521 10/08/16 0325  Weight: 132 lb 9.6 oz (60.1 kg) 134 lb 1.6 oz (60.8 kg) 131 lb 6.4 oz (59.6 kg)    Labs & Radiologic Studies     CBC  Recent Labs  10/07/16 0500 10/08/16 0428  WBC 7.3 7.2  HGB 8.7* 8.6*  HCT 26.0* 26.2*  MCV 82.5 83.2  PLT 211 [id number]   Basic Metabolic Panel  Recent Labs  10/06/16 0322 10/07/16 0500  NA 130* 133*  K 4.7 5.1  CL 102 105  CO2 21* 21*  GLUCOSE 97 98  BUN 22* 22*  CREATININE 0.90 1.08*  CALCIUM 9.5 9.7    Dg Chest 2 View  Result Date: 09/29/2016 CLINICAL DATA:  Chest pain and shortness of breath. EXAM: CHEST  2 VIEW COMPARISON:  Chest radiograph March 11, 2016 FINDINGS: Cardiac silhouette is moderate to severely enlarged, unchanged. Status post median sternotomy for cardiac valve replacement. Calcified aortic knob. Single LEFT cardiac pacemaker in situ. No pleural effusion or focal  consolidation. Similar bandlike density RIGHT lower lung zone. No pneumothorax. Soft tissue planes and included osseous structures are nonsuspicious. Mild degenerative change of the thoracic spine. IMPRESSION: Stable cardiomegaly.  RIGHT lung base atelectasis/ scarring. Electronically Signed   By: Elon Alas M.D.   On: 09/29/2016 18:59    Disposition   Pt is being discharged home today in good condition.  Follow-up Plans & Appointments    Follow-up Information    Barrett, Suanne Marker, PA-C Follow up on 10/16/2016.   Specialties:  Cardiology, Radiology Why:  at 2:00 pm for hospital follow up.  Contact information: [address] [place] [address] [place] [facility] Follow up on 10/12/2016.   Specialty:  Cardiology Why:  at noon for an INR check (coumadin level)  Contact information: [address] [place] [phone]         Discharge Instructions    AMB Referral to Cardiac Rehabilitation - Phase II    Complete by:  As directed    Diagnosis:   NSTEMI Coronary Stents     Amb Referral to Cardiac Rehabilitation    Complete by:  As directed    Diagnosis:   Coronary Stents  PTCA NSTEMI     Diet - low sodium heart healthy    Complete by:  As directed    Increase activity slowly    Complete by:  As directed       Discharge Medications   Current Discharge Medication List    START taking these medications   Details  atorvastatin (LIPITOR) 40 MG tablet Take 1 tablet (40 mg total) by mouth daily at 6 PM. Qty: 30 tablet, Refills: 12    clopidogrel (PLAVIX) 75 MG tablet Take 1 tablet (75 mg total) by mouth daily with breakfast. Qty: 30 tablet, Refills: 12    nitroGLYCERIN (NITROSTAT) 0.4 MG SL tablet Place 1 tablet (0.4 mg total) under the tongue every 5 (five) minutes x 3 doses as needed for chest pain. Qty: 25 tablet, Refills: 2      CONTINUE these medications which have CHANGED    Details  metoprolol tartrate (LOPRESSOR) 25 MG tablet Take 1 tablet (25 mg total) by mouth 2 (two) times daily. Qty: 60 tablet, Refills: 12    potassium chloride SA (K-DUR,KLOR-CON) 20 MEQ tablet Take 2 tablets (40 mEq total) by mouth daily. Qty: 180 tablet, Refills: 3      CONTINUE these medications which have NOT CHANGED   Details  Biotin 1000 MCG tablet Take 1,000 mcg by mouth daily.    diltiazem (CARTIA XT) 120 MG 24 hr capsule Take 1 capsule (120 mg total) by mouth daily. Qty: 90 capsule, Refills: 3    docusate sodium (COLACE) 100 MG capsule Take 200 mg by mouth 3 (three) times daily.     furosemide (LASIX) 40 MG tablet Take 1 tablet (40 mg total) by mouth daily. Qty: 90 tablet, Refills: 3    Multiple Vitamin (MULTIVITAMINS PO) Take 1 tablet by mouth daily.     Omega-3 Fatty Acids (FISH OIL) 1000 MG CAPS Take 1,000 mg by mouth daily.    polyethylene glycol (MIRALAX / GLYCOLAX) packet Take 17 g by mouth as needed (for constipation). Reported on 03/25/2016    warfarin (COUMADIN) 5 MG tablet TAKE 1 TABLET BY MOUTH DAILY AS DIRECTED Qty: 90 tablet, Refills: 1           Outstanding Labs/Studies     Duration of Discharge Encounter   Greater than 30 minutes including physician time.  Signed, Arbutus Leas NP 10/08/2016, 10:27 AM   Agree with note by [name] NP  INR 2. No Sx. AFIB with CVR. OK for DC home on coumadin and plavix. Can DC ASA. Needs PT/INR w/in a week in our coumadin clinic then [place] with MD several weeks later. Exam benign.   Lorretta Harp, M.D., [place], [facility], [place] [address] [place], [place]  [address]  [phone] 10/08/2016 10:29 AM

## 2016-10-08 NOTE — Progress Notes (Signed)
Pt discharged home. Discharge instructions have been gone over with the patient. IV's removed. Pt given unit number and told to call if they have any concerns regarding their discharge instructions. Lisbeth Puller V, RN   

## 2016-10-08 NOTE — Progress Notes (Signed)
ANTICOAGULATION CONSULT NOTE  Pharmacy Consult:  Heparin / warfarin Indication: atrial fibrillation  Patient Measurements: Height: 5\' 1"  (154.9 cm) Weight: 131 lb 6.4 oz (59.6 kg) IBW/kg (Calculated) : 47.8 Heparin Dosing Weight: 52 kg  Vital Signs: Temp: 98.4 F (36.9 C) (01/18 0325) Temp Source: Oral (01/18 0325) BP: 127/52 (01/18 0325) Pulse Rate: 98 (01/18 0325)  Labs:  Recent Labs  10/06/16 0322 10/07/16 0500 10/08/16 0428  HGB 9.2* 8.7* 8.6*  HCT 27.0* 26.0* 26.2*  PLT 205 211 255  LABPROT 20.2* 21.5* 22.9*  INR 1.70 1.84 1.99  HEPARINUNFRC 0.52 0.53 0.51  CREATININE 0.90 1.08*  --     Estimated Creatinine Clearance: 37.9 mL/min (by C-G formula based on SCr of 1.08 mg/dL (H)).   Medical History: Past Medical History:  Diagnosis Date  . Anticoagulated on Coumadin    for mech valve and atrial fib  goal 2.0-2.5  . Anxiety   . Arthritis    "right shoulder" (03/15/2013)  . Atrial fibrillation ([place])   . Atrial flutter ([place])   . CAD (coronary artery disease) 08/11/2016  . CHF (congestive heart failure) ([place])   . Chronic combined systolic and diastolic CHF (congestive heart failure) ([place])    a. 02/2016 Echo: EF 45-50%.  . Diverticula of colon   . Exertional shortness of breath   . GERD (gastroesophageal reflux disease)   . Heart murmur   . Hemorrhage intraabdominal 03/17/2012  . History of blood transfusion    "once" (03/15/2013)  . Hyperlipidemia   . Hypertension   . Liver hemorrhage   . Migraines   . 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 Bi-leaflet St. Jude mechanical prosthesis; b. 02/2016 Echo: EF 45-50%, some degree of MR, sev dil LA/RA, sev TR.  . NSTEMI (non-ST elevated myocardial infarction) ([place])    a. 02/2016 elev trop/Cath: nonobs dzs,   . Pacemaker    medtronic adapta  . Pneumonia 2009   resolved.? OPD Rx  . Severe tricuspid regurgitation    a. 02/2016 Echo: Ef 45-50%, sev TR, PASP 59mmHg.  . Sick sinus syndrome ([place])    Dr  Cristopher Peru. EP study negative. Pacemaker 12/15/06 Medtronic  . Stroke [facility])    "they say I had a stroke last year" denies residual on 03/15/2013  . Subdural hematoma (H[facility])     Assessment: 75 YOF on warfarin PTA for h/o Afib and mechanical mitral valve. Patient presented with chest pain and elevated troponin, now s/p cath. Pharmacy consulted to re-start warfarin with heparin bridge - INR remains subtherapeutic, increased to 1.99 after further increased doses last 2 nights. Heparin remains level therapeutic at 0.51 on 800 units/hr. CBC stable, small amount of bleeding noted from hand in RN note form 1/18 AM - to notify Pharmacy if worsens.  Of note, lower INR goal due to h/o liver hemorrhage and subdural hematoma in 2013 per anticoagulation clinic notes - MD aware.  PTA warfarin regimen: 5mg  daily except 7.5mg  on Mon/Fri  Goal of Therapy:  Heparin level 0.3-0.7 units/mL INR 2-2.5 per [facility] clinic note Monitor platelets by anticoagulation protocol: Yes    Plan:  - Continue heparin at 800 units/hr - d/c when INR therapeutic - Warfarin 5 mg PO x 1 tonight - Daily heparin level, INR, and CBC - Monitor for s/sx of bleeding   Elicia Lamp, PharmD, BCPS Clinical Pharmacist 10/08/2016 8:33 AM

## 2016-10-08 NOTE — Care Management Note (Addendum)
Case Management Note  Patient Details  Name: Carrie [name] MRN: [id number] Date of Birth: 08-01-1941  Subjective/Objective: Pt presented for Nstemi. Pt is from home with family support. Plan for d/c today. Pt wanted [facility] to follow at home. CM did send referral to [facility]. Pt asking for [facility] as well for disease/ medication management.                    Action/Plan: CM did offer agency list and pt chose [facility]. CM did make referral and SOC ot begin within 24-48 hours post d/c. No further needs from CM @ this time.   Expected Discharge Date:  10/08/16               Expected Discharge Plan:  [place]  In-House Referral:  NA  Discharge planning Services  CM Consult  Post Acute Care Choice:  Home Health Choice offered to:  Patient  DME Arranged:  N/A DME Agency:  NA  HH Arranged:  RN [place] Agency:  [place] (now [facility]) Status of Service:  Completed, signed off  If discussed at [employer], dates discussed:    Additional Comments: 1228 10-08-16 Jacqlyn Krauss, RN,BSN [phone] CM did make referral to [facility] in regards to [facility] call back in regards to unable to take patient. CM did speak with pt and pt chose [facility]. Referral made to [facility] and [facility] to begin within 24-48 hours post d/c.  Bethena Roys, RN 10/08/2016, 12:15 PM

## 2016-10-09 ENCOUNTER — Other Ambulatory Visit: Payer: Self-pay

## 2016-10-09 NOTE — Patient Outreach (Addendum)
[facility]) Care Management  10/09/16  Carrie [name] Jul 14, 1941 [id number]  RNCM received referral for transition of care. Patient recently hospitalized 1/9-1/18/2018 for NSTEMI.  Subjective: Initial outreach completed successfully with patient. Patient identification verified via two identifiers. RNCM provided education regarding the program and patient is agreeable to outreach. Patient stated that she is doing ok since her discharge.   Objective: Per records review, patient was admitted on 09/29/2016 after presenting with chest pain associated with nausea and vomiting. She had elevated troponin. Left heart catheterization showed "severe ostial RCA lesion treated with DES." Echocardiogram showed preserved LVEF 50-55%. She was discharged home on 10/08/2016 on Plavix and Coumadin.   Patient's past medical history consists of coronary artery disease, pulmonary artery hypertension, anemia, acute kidney injury, hyponatremia, previous NSTEMI, moderate to severe tricuspid regurgitation, thrombocytopenia, sick sinus syndrome, atrial fibrillation, pacemaker (single chamber, Medtronic Adapta), acute on chronic CHF, essential hypertension, subarachnoid hemorrhage (08/2001), mitral valve replacement (St. Jude) and tachycardia.  Assessment: Patient stated that she was able to get all of her medications filled and is taking them as prescribed. She is able to verbalize her Coumadin dosing and is also able to verbalize what each of her medications are for. She did have questions about clopidrogel and RNCM provided education about the medication and patient verbalized understanding.   Patient stated that she has a follow up appointment with the cardiologist on 10/16/2016. She has an appointment on 10/12/2016 to have her INR rechecked.  She has not yet scheduled an appointment with her primary care provider, verified as Dr. Lucianne Lei.   Plan:   [facility] CM Care Plan Problem One   Flowsheet Row Most  Recent Value  Care Plan Problem One  Risk of hospital readmission related to coronary artery disease as evidenced by recent hospitalization for NSTEMI  Role Documenting the Problem One  Care Management [place] for Problem One  Active  [facility] Long Term Goal (31-90 days)  Patient will not be readmitted to hospital within the next 31 days  [facility] Long Term Goal Start Date  10/09/16  Interventions for Problem One Long Term Goal  RNCM educated patient regarding importance of following discharge instructions, including attending all follow up appointments, taking medications as prescribed, and following diet.   [facility] CM Short Term Goal #1 (0-30 days)  Patient will attend hospital follow up appointment with cardiology within the next 2 weeks  [facility] CM Short Term Goal #1 Start Date  10/09/16  Interventions for Short Term Goal #1  RNCM educated patient on importance of attending followup appointments. Verified patient has an appointment for hospital follow up scheduled with cardiology on 10/16/16.  [facility] CM Short Term Goal #2 (0-30 days)  Patient will report taking all medications as prescribed within the next 30 days  [facility] CM Short Term Goal #2 Start Date  10/09/16  Interventions for Short Term Goal #2  RNCM reviewed medication list with patient and patient able to verbalize all medications and what they were for. RNCM educated patient regarding the importance of takinng all medications as prescribed.       Patient agreeable to home visit, but requested it be in 2 weeks because they have had a death in her husband's family. Home visit scheduled for 2 weeks and RNCM will outreach telephonically next week. Patient ended call early due to son calling from [place]. RNCM provided contact information and invited callback when she is available to continue assessment, and RNCM will also outreach  next week. Patient agreeable.  [place] R. Bhumi Godbey, RN, BSN, [place] Management Coordinator [phone]

## 2016-10-11 DIAGNOSIS — I5043 Acute on chronic combined systolic (congestive) and diastolic (congestive) heart failure: Secondary | ICD-10-CM | POA: Diagnosis not present

## 2016-10-11 DIAGNOSIS — E7800 Pure hypercholesterolemia, unspecified: Secondary | ICD-10-CM | POA: Diagnosis not present

## 2016-10-11 DIAGNOSIS — I2721 Secondary pulmonary arterial hypertension: Secondary | ICD-10-CM | POA: Diagnosis not present

## 2016-10-11 DIAGNOSIS — Z7902 Long term (current) use of antithrombotics/antiplatelets: Secondary | ICD-10-CM | POA: Diagnosis not present

## 2016-10-11 DIAGNOSIS — I110 Hypertensive heart disease with heart failure: Secondary | ICD-10-CM | POA: Diagnosis not present

## 2016-10-11 DIAGNOSIS — Z7901 Long term (current) use of anticoagulants: Secondary | ICD-10-CM | POA: Diagnosis not present

## 2016-10-11 DIAGNOSIS — Z955 Presence of coronary angioplasty implant and graft: Secondary | ICD-10-CM | POA: Diagnosis not present

## 2016-10-11 DIAGNOSIS — I480 Paroxysmal atrial fibrillation: Secondary | ICD-10-CM | POA: Diagnosis not present

## 2016-10-11 DIAGNOSIS — I071 Rheumatic tricuspid insufficiency: Secondary | ICD-10-CM | POA: Diagnosis not present

## 2016-10-11 DIAGNOSIS — Z952 Presence of prosthetic heart valve: Secondary | ICD-10-CM | POA: Diagnosis not present

## 2016-10-11 DIAGNOSIS — I2510 Atherosclerotic heart disease of native coronary artery without angina pectoris: Secondary | ICD-10-CM | POA: Diagnosis not present

## 2016-10-11 DIAGNOSIS — Z950 Presence of cardiac pacemaker: Secondary | ICD-10-CM | POA: Diagnosis not present

## 2016-10-11 DIAGNOSIS — I214 Non-ST elevation (NSTEMI) myocardial infarction: Secondary | ICD-10-CM | POA: Diagnosis not present

## 2016-10-12 ENCOUNTER — Ambulatory Visit (INDEPENDENT_AMBULATORY_CARE_PROVIDER_SITE_OTHER): Payer: Medicare Other | Admitting: Pharmacist Clinician (PhC)/ Clinical Pharmacy Specialist

## 2016-10-12 ENCOUNTER — Ambulatory Visit: Payer: Self-pay

## 2016-10-12 ENCOUNTER — Telehealth (HOSPITAL_COMMUNITY): Payer: Self-pay | Admitting: Family Medicine

## 2016-10-12 DIAGNOSIS — Z7901 Long term (current) use of anticoagulants: Secondary | ICD-10-CM

## 2016-10-12 DIAGNOSIS — I482 Chronic atrial fibrillation, unspecified: Secondary | ICD-10-CM

## 2016-10-12 DIAGNOSIS — Z952 Presence of prosthetic heart valve: Secondary | ICD-10-CM

## 2016-10-12 LAB — POCT INR: INR: 2.1

## 2016-10-12 NOTE — Telephone Encounter (Signed)
S/w Caryl Pina with [facility] Medicare confirming verification for pt to have Cardiac Reh. Co-pay $20.00, Out Of Pocket $4,400.00, $19.42 has been met, no limits,  no co-insurance  Reference # [id number]

## 2016-10-13 ENCOUNTER — Other Ambulatory Visit: Payer: Self-pay

## 2016-10-13 NOTE — Patient Outreach (Signed)
[facility]) Care Management  10/13/16  LILYANAH MARTORELLA Apr 22, 1941 [id number]  Attempted to reach patient for transition of care follow up without success. Phone rang multiple times with no answer and no option to leave a voicemail. Will attempt patient again in next couple of days.  [place] R. Kristyne Woodring, RN, BSN, [place] Management Coordinator [phone]

## 2016-10-14 ENCOUNTER — Ambulatory Visit: Payer: Self-pay

## 2016-10-14 DIAGNOSIS — I2510 Atherosclerotic heart disease of native coronary artery without angina pectoris: Secondary | ICD-10-CM | POA: Diagnosis not present

## 2016-10-14 DIAGNOSIS — Z7901 Long term (current) use of anticoagulants: Secondary | ICD-10-CM | POA: Diagnosis not present

## 2016-10-14 DIAGNOSIS — Z955 Presence of coronary angioplasty implant and graft: Secondary | ICD-10-CM | POA: Diagnosis not present

## 2016-10-14 DIAGNOSIS — E7800 Pure hypercholesterolemia, unspecified: Secondary | ICD-10-CM | POA: Diagnosis not present

## 2016-10-14 DIAGNOSIS — Z950 Presence of cardiac pacemaker: Secondary | ICD-10-CM | POA: Diagnosis not present

## 2016-10-14 DIAGNOSIS — I071 Rheumatic tricuspid insufficiency: Secondary | ICD-10-CM | POA: Diagnosis not present

## 2016-10-14 DIAGNOSIS — Z952 Presence of prosthetic heart valve: Secondary | ICD-10-CM | POA: Diagnosis not present

## 2016-10-14 DIAGNOSIS — I5043 Acute on chronic combined systolic (congestive) and diastolic (congestive) heart failure: Secondary | ICD-10-CM | POA: Diagnosis not present

## 2016-10-14 DIAGNOSIS — I480 Paroxysmal atrial fibrillation: Secondary | ICD-10-CM | POA: Diagnosis not present

## 2016-10-14 DIAGNOSIS — Z7902 Long term (current) use of antithrombotics/antiplatelets: Secondary | ICD-10-CM | POA: Diagnosis not present

## 2016-10-14 DIAGNOSIS — I214 Non-ST elevation (NSTEMI) myocardial infarction: Secondary | ICD-10-CM | POA: Diagnosis not present

## 2016-10-14 DIAGNOSIS — I110 Hypertensive heart disease with heart failure: Secondary | ICD-10-CM | POA: Diagnosis not present

## 2016-10-14 DIAGNOSIS — I2721 Secondary pulmonary arterial hypertension: Secondary | ICD-10-CM | POA: Diagnosis not present

## 2016-10-16 ENCOUNTER — Ambulatory Visit (INDEPENDENT_AMBULATORY_CARE_PROVIDER_SITE_OTHER): Payer: Medicare Other | Admitting: Physician Assistant

## 2016-10-16 ENCOUNTER — Encounter: Payer: Self-pay | Admitting: Physician Assistant

## 2016-10-16 VITALS — BP 118/80 | HR 83 | Ht 61.0 in | Wt 137.8 lb

## 2016-10-16 DIAGNOSIS — Z79899 Other long term (current) drug therapy: Secondary | ICD-10-CM

## 2016-10-16 DIAGNOSIS — I5033 Acute on chronic diastolic (congestive) heart failure: Secondary | ICD-10-CM

## 2016-10-16 DIAGNOSIS — I252 Old myocardial infarction: Secondary | ICD-10-CM

## 2016-10-16 NOTE — Progress Notes (Signed)
Cardiology Office Note   Date:  10/16/2016   ID:  Carrie [name], DOB December 25, 1940, MRN [id number]  PCP:  Elyn Peers, MD  Cardiologist:  Dr. Sallyanne Kuster 05/05/2016 Carrie Ferries, PA-C   Chief Complaint  Patient presents with  . Follow-up    History of Present Illness: Carrie [name] is a 76 y.o. 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   02/2016 admit for NSTEMI felt 2nd demand ischemia, no sig CAD at cath, ?from GIB w/ Hgb as low as 8.1 05/2016 polyp and diverticuli seen on colonoscopy Admit 01/09-01/18 for NSTEMI, s/p DES RCA, EF 50-55%  Carrie [name] presents for follow up.  Since d/c, she has done fairly well. She has felt a little weak. She is trying to increase her activity, but it is slow. She is not walking per rehab guidelines, but is willing to start.   She has not had chest pain like her MI. Sometimes she will have some lower R rib pain or upper L chest pain. She has not taken meds for the pain, it resolves spontaneously.   She gets DOE with short distances, not sure how far. Her PAS was 55 and there was RV overload on echo 01/10. She has had LE edema, worse just after d/c. In the last 2 days, it has been better. She is not waking in the night SOB. She is not sleeping well, but denies orthopnea.   Past Medical History:  Diagnosis Date  . Anticoagulated on Coumadin    for mech valve and atrial fib  goal 2.0-2.5  . Anxiety   . Arthritis    "right shoulder" (03/15/2013)  . Atrial fibrillation ([place])   . Atrial flutter ([place])   . CAD (coronary artery disease) 08/11/2016  . CHF (congestive heart failure) ([place])   . Chronic combined systolic and diastolic CHF (congestive heart failure) ([place])    a. 02/2016 Echo: EF 45-50%.  . Diverticula of colon   . Exertional shortness of breath   . GERD  (gastroesophageal reflux disease)   . Heart murmur   . Hemorrhage intraabdominal 03/17/2012  . History of blood transfusion    "once" (03/15/2013)  . Hyperlipidemia   . Hypertension   . Liver hemorrhage   . Migraines   . 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 Bi-leaflet St. Jude mechanical prosthesis; b. 02/2016 Echo: EF 45-50%, some degree of MR, sev dil LA/RA, sev TR.  . NSTEMI (non-ST elevated myocardial infarction) ([place])    a. 02/2016 elev trop/Cath: nonobs dzs,   . Pacemaker    medtronic adapta  . Pneumonia 2009   resolved.? OPD Rx  . Severe tricuspid regurgitation    a. 02/2016 Echo: Ef 45-50%, sev TR, PASP 68mmHg.  . Sick sinus syndrome ([place])    Dr Cristopher Peru. EP study negative. Pacemaker 12/15/06 Medtronic  . Stroke [facility])    "they say I had a stroke last year" denies residual on 03/15/2013  . Subdural hematoma [facility])     Past Surgical History:  Procedure Laterality Date  . APPENDECTOMY    . CARDIAC CATHETERIZATION  04/02/97   R&L:severe MR/pulmonary hypertension  . CARDIAC CATHETERIZATION N/A 03/16/2016   Procedure: Left Heart Cath and Coronary Angiography;  Surgeon: [name], MD;  Location: [place] CV LAB;  Service:  Cardiovascular;  Laterality: N/A;  . CARDIAC CATHETERIZATION N/A 09/30/2016   Procedure: Left Heart Cath and Coronary Angiography;  Surgeon: [name], MD;  Location: [place] CV LAB;  Service: Cardiovascular;  Laterality: N/A;  . CARDIAC CATHETERIZATION N/A 09/30/2016   Procedure: Coronary Stent Intervention;  Surgeon: [name], MD;  3.5 x 12 mm resolute Onyx  to ostial RCA  . CATARACT EXTRACTION W/ INTRAOCULAR LENS  IMPLANT, BILATERAL Bilateral 2013  . DILATION AND CURETTAGE OF UTERUS     "had fibroids" (03/15/2013)  . EXPLORATORY LAPAROTOMY     "had a growth on my intestines" (03/15/2013)  . HAMMER TOE SURGERY Right   . INSERT / REPLACE / REMOVE PACEMAKER  12/15/2006   Medtronic  . MITRAL VALVE REPLACEMENT  1998     St Jude mechanical; Dr. [name]  . PACEMAKER PLACEMENT  12/15/06   medtronic adapta for SSS  . PERMANENT PACEMAKER GENERATOR CHANGE N/A 07/24/2014   Procedure: PERMANENT PACEMAKER GENERATOR CHANGE;  Surgeon: [name], MD;  Location: [place] CATH LAB;  Service: Cardiovascular;  Laterality: N/A;  . PERSANTINE CARDIOLITE  08/07/03   mild inf. ischemia   . TEE WITHOUT CARDIOVERSION  09/23/2011   Procedure: TRANSESOPHAGEAL ECHOCARDIOGRAM (TEE);  Surgeon: [name];  Location: [facility]OS[facility];  Service: Cardiovascular;  Laterality: N/A;  . TONSILLECTOMY    . TUBAL LIGATION    . US ECHOCARDIOGRAPHY  11/19/2011   EF 50-55%,RA mod to severely dilated,LA severely dilated,trace MR,small vegetation or mass on the MV,AOV mildly scleroticmild PI, RV pressure 40-85mmHg    Current Outpatient Prescriptions  Medication Sig Dispense Refill  . atorvastatin (LIPITOR) 40 MG tablet Take 1 tablet (40 mg total) by mouth daily at 6 PM. 30 tablet 12  . Biotin 1000 MCG tablet Take 1,000 mcg by mouth daily.    . clopidogrel (PLAVIX) 75 MG tablet Take 1 tablet (75 mg total) by mouth daily with breakfast. 30 tablet 12  . diltiazem (CARTIA XT) 120 MG 24 hr capsule Take 1 capsule (120 mg total) by mouth daily. 90 capsule 3  . docusate sodium (COLACE) 100 MG capsule Take 200 mg by mouth 3 (three) times daily.     . furosemide (LASIX) 40 MG tablet Take 1 tablet (40 mg total) by mouth daily. 90 tablet 3  . metoprolol tartrate (LOPRESSOR) 25 MG tablet Take 1 tablet (25 mg total) by mouth 2 (two) times daily. 60 tablet 12  . Multiple Vitamin (MULTIVITAMINS PO) Take 1 tablet by mouth daily.     . nitroGLYCERIN (NITROSTAT) 0.4 MG SL tablet Place 1 tablet (0.4 mg total) under the tongue every 5 (five) minutes x 3 doses as needed for chest pain. 25 tablet 2  . Omega-3 Fatty Acids (FISH OIL) 1000 MG CAPS Take 1,000 mg by mouth daily.    . polyethylene glycol (MIRALAX / GLYCOLAX) packet Take 17 g by mouth as needed (for  constipation). Reported on 03/25/2016    . potassium chloride SA (K-DUR,KLOR-CON) 20 MEQ tablet Take 2 tablets (40 mEq total) by mouth daily. 180 tablet 3  . warfarin (COUMADIN) 5 MG tablet TAKE 1 TABLET BY MOUTH DAILY AS DIRECTED (Patient taking differently: TAKE 1 TABLET BY MOUTH DAILY AS DIRECTED take one and a half on mondays and fridays) 90 tablet 1   No current facility-administered medications for this visit.     Allergies:   Codeine    Social History:  The patient  reports that she has never smoked. She has never  used smokeless tobacco. She reports that she does not drink alcohol or use drugs.   Family History:  The patient's family history includes Cancer in her mother and sister; Diabetes in her sister; Healthy in her brother, brother, brother, and sister; Heart attack in her maternal grandfather; Kidney disease in her daughter; Leukemia in her sister; Stroke in her brother and father.    ROS:  Please see the history of present illness. All other systems are reviewed and negative.    PHYSICAL EXAM: VS:  BP 118/80   Pulse 83   Ht 5\' 1"  (1.549 m)   Wt 137 lb 12.8 oz (62.5 kg)   BMI 26.04 kg/m  , BMI Body mass index is 26.04 kg/m. GEN: Well nourished, well developed, female in no acute distress  HEENT: normal for age  Neck: JVD To jaw, + carotid bruit, no masses Cardiac: RRR; 3/6 murmur that radiates to both carotids, no rubs, or gallops Respiratory:  Rales bases bilaterally, normal work of breathing GI: soft, nontender, nondistended, + BS MS: no deformity or atrophy; trace-1+ lower extremity edema; distal pulses are 2+ in all 4 extremities   Skin: warm and dry, no rash Neuro:  Strength and sensation are intact Psych: euthymic mood, full affect   EKG:  EKG is ordered today. The ekg ordered today demonstrates Atrial fibrillation with ventricular pacing  CATH: 09/30/2016  There is mild left ventricular systolic dysfunction.  LV end diastolic pressure is normal.  The  left ventricular ejection fraction is 45-50% by visual estimate.  There is no mitral valve regurgitation.  A 3.5 x 12 mm resolute Onyx drug eluting stent was successfully placed.  Ost RCA lesion, 90 %stenosed.  Post intervention, there is a 10% residual stenosis.  1. Severe one-vessel coronary artery disease affecting the ostial right coronary artery with 90% stenosis. Severe pressure dampening with catheter engagement. 2. Mildly reduced LV systolic function with global hypokinesis. Mildly elevated left ventricular end-diastolic pressure. 3. Successful angioplasty and drug-eluting stent placement to the ostial right coronary artery. 4. Moderately difficult catheterization via the right radial artery due to tortuous innominate artery Recommendations: Resume heparin in 8 hours after sheath will given that the patient has atrial fibrillation and mechanical mitral valve. Warfarin will be resumed tonight as well. In terms of antiplatelet therapy, I recommend aspirin and Plavix until INR is therapeutic. After that, aspirin can be discontinued. The patient can continue on warfarin and Plavix monotherapy for one year. Recommend aggressive treatment of risk factors.  ECHO: 10/01/2016 - Left ventricle: Systolic function was normal. The estimated   ejection fraction was in the range of 50% to 55%. Wall motion was   normal; there were no regional wall motion abnormalities. Mitral   valve prosthesis and atrial fibrillation prevents evaluation of   LV diastolic function. - Ventricular septum: Septal motion showed paradox. The contour   showed diastolic flattening. These changes are consistent with RV   volume overload. - Aortic valve: Right coronary cusp mobility was moderately   restricted. There was trivial regurgitation. - Mitral valve: A mechanical prosthesis was present and functioning   normally. - Left atrium: The atrium was severely dilated. - Right ventricle: The cavity size was  moderately dilated. Systolic   function was mildly to moderately reduced. - Right atrium: The atrium was severely dilated. - Atrial septum: No defect or patent foramen ovale was identified. - Tricuspid valve: There was severe regurgitation. - Pulmonary arteries: Systolic pressure was moderately increased.   PA peak pressure:  55 mm Hg (S).   Recent Labs: 03/21/2016: ALT 28 09/02/2016: Magnesium 2.2 09/30/2016: TSH 1.666 10/07/2016: BUN 22; Creatinine, Ser 1.08; Potassium 5.1; Sodium 133 10/08/2016: Hemoglobin 8.6; Platelets 255    Lipid Panel    Component Value Date/Time   CHOL 144 03/12/2016 0430   TRIG 35 03/12/2016 0430   HDL 49 03/12/2016 0430   CHOLHDL 2.9 03/12/2016 0430   VLDL 7 03/12/2016 0430   LDLCALC 88 03/12/2016 0430     Wt Readings from Last 3 Encounters:  10/16/16 137 lb 12.8 oz (62.5 kg)  10/08/16 131 lb 6.4 oz (59.6 kg)  08/11/16 142 lb 9.6 oz (64.7 kg)     Other studies Reviewed: Additional studies/ records that were reviewed today include: Office notes, hospital records and testing.  ASSESSMENT AND PLAN:  1.  NSTEMI, subsequent episode of care: She is not on aspirin because of the Coumadin. She is on high-dose statin, Plavix, beta blocker and has sublingual nitroglycerin. Continue these medications at the current doses. She is encouraged to increase her activity and walked 5 minutes twice a day and whatever location or area she can. However, it was emphasized to her that she needs to go at her own pace, and not push herself hard.  2. Acute on chronic diastolic CHF: She needs to temporarily increase her Lasix to twice a day. She is to get a BMET at her follow-up appointment. She needs to be seen next week to make sure her heart failure has improved. Hopefully as she diuresis, she can increase her activity.   Current medicines are reviewed at length with the patient today.  The patient does not have concerns regarding medicines.  The following changes have  been made:  Increase Lasix  Labs/ tests ordered today include:   Orders Placed This Encounter  Procedures  . Basic Metabolic Panel (BMET)  . EKG 12-Lead     Disposition:   FU with me next week and then with Dr. Sallyanne Kuster  Signed, Carrie Ferries, PA-C  10/16/2016 5:55 PM    [place] Phone: [phone]; Fax: [phone]  This note was written with the assistance of speech recognition software. Please excuse any transcriptional errors.

## 2016-10-16 NOTE — Patient Instructions (Addendum)
  Medication Instructions:  INCREASE lasix to 40mg  (1 tablet) two times a day for 4 DAYS.   Call our office if you become lightheaded or weak.  Labwork: Have lab work completed at follow up appointment on 2/6 (BMET)  Testing/Procedures: NONE  Follow-Up: Follow up appointment with Rosaria Ferries PA on 2/6 at 2 pm at [facility] office.  Any Other Special Instructions Will Be Listed Below (If Applicable).  Increase activity as tolerated.  Walk 5 mins two times a day at your own pace.  Go up stairs slowly.    If you need a refill on your cardiac medications before your next appointment, please call your pharmacy.

## 2016-10-19 NOTE — Progress Notes (Signed)
Thank you,  MCr 

## 2016-10-20 ENCOUNTER — Other Ambulatory Visit: Payer: Self-pay

## 2016-10-20 DIAGNOSIS — I071 Rheumatic tricuspid insufficiency: Secondary | ICD-10-CM | POA: Diagnosis not present

## 2016-10-20 DIAGNOSIS — Z7902 Long term (current) use of antithrombotics/antiplatelets: Secondary | ICD-10-CM | POA: Diagnosis not present

## 2016-10-20 DIAGNOSIS — I2510 Atherosclerotic heart disease of native coronary artery without angina pectoris: Secondary | ICD-10-CM | POA: Diagnosis not present

## 2016-10-20 DIAGNOSIS — I480 Paroxysmal atrial fibrillation: Secondary | ICD-10-CM | POA: Diagnosis not present

## 2016-10-20 DIAGNOSIS — I110 Hypertensive heart disease with heart failure: Secondary | ICD-10-CM | POA: Diagnosis not present

## 2016-10-20 DIAGNOSIS — Z955 Presence of coronary angioplasty implant and graft: Secondary | ICD-10-CM | POA: Diagnosis not present

## 2016-10-20 DIAGNOSIS — Z950 Presence of cardiac pacemaker: Secondary | ICD-10-CM | POA: Diagnosis not present

## 2016-10-20 DIAGNOSIS — Z952 Presence of prosthetic heart valve: Secondary | ICD-10-CM | POA: Diagnosis not present

## 2016-10-20 DIAGNOSIS — Z7901 Long term (current) use of anticoagulants: Secondary | ICD-10-CM | POA: Diagnosis not present

## 2016-10-20 DIAGNOSIS — E7800 Pure hypercholesterolemia, unspecified: Secondary | ICD-10-CM | POA: Diagnosis not present

## 2016-10-20 DIAGNOSIS — I214 Non-ST elevation (NSTEMI) myocardial infarction: Secondary | ICD-10-CM | POA: Diagnosis not present

## 2016-10-20 DIAGNOSIS — I5043 Acute on chronic combined systolic (congestive) and diastolic (congestive) heart failure: Secondary | ICD-10-CM | POA: Diagnosis not present

## 2016-10-20 DIAGNOSIS — I2721 Secondary pulmonary arterial hypertension: Secondary | ICD-10-CM | POA: Diagnosis not present

## 2016-10-20 NOTE — Patient Outreach (Signed)
[facility]) Care Management  [place]  10/20/2016   Carrie [name] 03/31/1941 [id number]  Subjective: Initial home visit completed with patient and her spouse.   Objective: Blood pressure 118/62, pulse 75, resp. rate 18, height 1.549 m (5\' 1" ), weight 132 lb (59.9 kg), SpO2 98 %. Respirations even and unlabored. Breath sounds clear to auscultation.  Heart rate and rhythm regular.   Encounter Medications:  Outpatient Encounter Prescriptions as of 10/20/2016  Medication Sig  . atorvastatin (LIPITOR) 40 MG tablet Take 1 tablet (40 mg total) by mouth daily at 6 PM.  . clopidogrel (PLAVIX) 75 MG tablet Take 1 tablet (75 mg total) by mouth daily with breakfast.  . diltiazem (CARTIA XT) 120 MG 24 hr capsule Take 1 capsule (120 mg total) by mouth daily.  Marland Kitchen docusate sodium (COLACE) 100 MG capsule Take 200 mg by mouth 3 (three) times daily.   . furosemide (LASIX) 40 MG tablet Take 1 tablet (40 mg total) by mouth daily.  . metoprolol tartrate (LOPRESSOR) 25 MG tablet Take 1 tablet (25 mg total) by mouth 2 (two) times daily.  . Multiple Vitamin (MULTIVITAMINS PO) Take 1 tablet by mouth daily.   . nitroGLYCERIN (NITROSTAT) 0.4 MG SL tablet Place 1 tablet (0.4 mg total) under the tongue every 5 (five) minutes x 3 doses as needed for chest pain.  . Omega-3 Fatty Acids (FISH OIL) 1000 MG CAPS Take 1,000 mg by mouth daily.  . polyethylene glycol (MIRALAX / GLYCOLAX) packet Take 17 g by mouth as needed (for constipation). Reported on 03/25/2016  . potassium chloride SA (K-DUR,KLOR-CON) 20 MEQ tablet Take 2 tablets (40 mEq total) by mouth daily.  Marland Kitchen warfarin (COUMADIN) 5 MG tablet TAKE 1 TABLET BY MOUTH DAILY AS DIRECTED (Patient taking differently: TAKE 1 TABLET BY MOUTH DAILY AS DIRECTED take one and a half on mondays and fridays)  . Biotin 1000 MCG tablet Take 1,000 mcg by mouth daily.   No facility-administered encounter medications on file as of 10/20/2016.     Functional  Status:  In your present state of health, do you have any difficulty performing the following activities: 10/20/2016 09/30/2016  Hearing? N N  Vision? N N  Difficulty concentrating or making decisions? N N  Walking or climbing stairs? N N  Dressing or bathing? N N  Doing errands, shopping? Y N  Preparing Food and eating ? N -  Using the Toilet? N -  In the past six months, have you accidently leaked urine? N -  Do you have problems with loss of bowel control? N -  Managing your Medications? N -  Managing your Finances? N -  Housekeeping or managing your Housekeeping? Y -  Some recent data might be hidden    Fall/Depression Screening: PHQ 2/9 Scores 10/20/2016 04/09/2016 01/07/2012 11/26/2011 10/28/2011  PHQ - 2 Score 0 0 0 0 0    Assessment: Patient has been doing well since her discharge. She currently denies any chest pain or shortness of breath. Stated she has all of her medications and have been taking them as prescribed.  Patient stated that she has been increasing her activity slowly. She stated that she has not yet started cardiac rehab.  Patient stated that she does have scales and weighs herself daily. She is documenting weights in her home health book. No weight gain noted at present. Hospital discharge weight was 132 lbs  9.6 ounces. Today's weight was 132. Patient denies any swelling or shortness of  breath when sleeping.    Patient currently has no complaints or concerns. Has a follow up appointment next week with cardiology.    Plan:   [facility] CM Care Plan Problem One   Flowsheet Row Most Recent Value  Care Plan Problem One  (P) Risk of hospital readmission related to coronary artery disease as evidenced by recent hospitalization for NSTEMI  Role Documenting the Problem One  (P) Care Management [place] for Problem One  (P) Active  THN Long Term Goal (31-90 days)  (P) Patient will not be readmitted to hospital within the next 31 days  THN Long Term Goal Start Date   (P) 10/09/16  Interventions for Problem One Long Term Goal  (P) RNCM educated patient regarding importance of following discharge instructions, including attending all follow up appointments, taking medications as prescribed, and following diet.   THN CM Short Term Goal #1 (0-30 days)  (P) Patient will attend hospital follow up appointment with cardiology within the next 2 weeks  THN CM Short Term Goal #1 Start Date  (P) 10/09/16  Interventions for Short Term Goal #1  (P) RNCM educated patient on importance of attending followup appointments. Verified patient has an appointment for hospital follow up scheduled with cardiology on 10/16/16.  THN CM Short Term Goal #2 (0-30 days)  (P) Patient will report taking all medications as prescribed within the next 30 days  THN CM Short Term Goal #2 Start Date  (P) 10/09/16  Interventions for Short Term Goal #2  (P) RNCM reviewed medication list with patient and patient able to verbalize all medications and what they were for. RNCM educated patient regarding the importance of takinng all medications as prescribed.      Will follow up with patient next week for transition of care.   [place] R. Hayden Kihara, RN, BSN, [place] Management Coordinator [phone]

## 2016-10-23 DIAGNOSIS — I2510 Atherosclerotic heart disease of native coronary artery without angina pectoris: Secondary | ICD-10-CM | POA: Diagnosis not present

## 2016-10-23 DIAGNOSIS — Z7901 Long term (current) use of anticoagulants: Secondary | ICD-10-CM | POA: Diagnosis not present

## 2016-10-23 DIAGNOSIS — Z7902 Long term (current) use of antithrombotics/antiplatelets: Secondary | ICD-10-CM | POA: Diagnosis not present

## 2016-10-23 DIAGNOSIS — I5043 Acute on chronic combined systolic (congestive) and diastolic (congestive) heart failure: Secondary | ICD-10-CM | POA: Diagnosis not present

## 2016-10-23 DIAGNOSIS — I214 Non-ST elevation (NSTEMI) myocardial infarction: Secondary | ICD-10-CM | POA: Diagnosis not present

## 2016-10-23 DIAGNOSIS — I480 Paroxysmal atrial fibrillation: Secondary | ICD-10-CM | POA: Diagnosis not present

## 2016-10-23 DIAGNOSIS — Z950 Presence of cardiac pacemaker: Secondary | ICD-10-CM | POA: Diagnosis not present

## 2016-10-23 DIAGNOSIS — E7800 Pure hypercholesterolemia, unspecified: Secondary | ICD-10-CM | POA: Diagnosis not present

## 2016-10-23 DIAGNOSIS — Z955 Presence of coronary angioplasty implant and graft: Secondary | ICD-10-CM | POA: Diagnosis not present

## 2016-10-23 DIAGNOSIS — I2721 Secondary pulmonary arterial hypertension: Secondary | ICD-10-CM | POA: Diagnosis not present

## 2016-10-23 DIAGNOSIS — I110 Hypertensive heart disease with heart failure: Secondary | ICD-10-CM | POA: Diagnosis not present

## 2016-10-23 DIAGNOSIS — I071 Rheumatic tricuspid insufficiency: Secondary | ICD-10-CM | POA: Diagnosis not present

## 2016-10-23 DIAGNOSIS — Z952 Presence of prosthetic heart valve: Secondary | ICD-10-CM | POA: Diagnosis not present

## 2016-10-27 ENCOUNTER — Ambulatory Visit (INDEPENDENT_AMBULATORY_CARE_PROVIDER_SITE_OTHER): Payer: Medicare Other | Admitting: Physician Assistant

## 2016-10-27 ENCOUNTER — Ambulatory Visit: Payer: Self-pay

## 2016-10-27 ENCOUNTER — Ambulatory Visit (INDEPENDENT_AMBULATORY_CARE_PROVIDER_SITE_OTHER): Payer: Medicare Other | Admitting: Pharmacist

## 2016-10-27 ENCOUNTER — Encounter: Payer: Self-pay | Admitting: Physician Assistant

## 2016-10-27 VITALS — BP 127/79 | HR 95 | Ht 61.0 in | Wt 137.0 lb

## 2016-10-27 DIAGNOSIS — I482 Chronic atrial fibrillation, unspecified: Secondary | ICD-10-CM

## 2016-10-27 DIAGNOSIS — Z950 Presence of cardiac pacemaker: Secondary | ICD-10-CM | POA: Diagnosis not present

## 2016-10-27 DIAGNOSIS — I10 Essential (primary) hypertension: Secondary | ICD-10-CM | POA: Diagnosis not present

## 2016-10-27 DIAGNOSIS — Z79899 Other long term (current) drug therapy: Secondary | ICD-10-CM | POA: Diagnosis not present

## 2016-10-27 DIAGNOSIS — I480 Paroxysmal atrial fibrillation: Secondary | ICD-10-CM | POA: Diagnosis not present

## 2016-10-27 DIAGNOSIS — I2510 Atherosclerotic heart disease of native coronary artery without angina pectoris: Secondary | ICD-10-CM | POA: Diagnosis not present

## 2016-10-27 DIAGNOSIS — Z7901 Long term (current) use of anticoagulants: Secondary | ICD-10-CM | POA: Diagnosis not present

## 2016-10-27 DIAGNOSIS — Z952 Presence of prosthetic heart valve: Secondary | ICD-10-CM | POA: Diagnosis not present

## 2016-10-27 DIAGNOSIS — I5043 Acute on chronic combined systolic (congestive) and diastolic (congestive) heart failure: Secondary | ICD-10-CM | POA: Diagnosis not present

## 2016-10-27 DIAGNOSIS — I214 Non-ST elevation (NSTEMI) myocardial infarction: Secondary | ICD-10-CM

## 2016-10-27 DIAGNOSIS — Z955 Presence of coronary angioplasty implant and graft: Secondary | ICD-10-CM | POA: Diagnosis not present

## 2016-10-27 DIAGNOSIS — I071 Rheumatic tricuspid insufficiency: Secondary | ICD-10-CM | POA: Diagnosis not present

## 2016-10-27 DIAGNOSIS — I5033 Acute on chronic diastolic (congestive) heart failure: Secondary | ICD-10-CM | POA: Diagnosis not present

## 2016-10-27 DIAGNOSIS — I2721 Secondary pulmonary arterial hypertension: Secondary | ICD-10-CM | POA: Diagnosis not present

## 2016-10-27 DIAGNOSIS — I110 Hypertensive heart disease with heart failure: Secondary | ICD-10-CM | POA: Diagnosis not present

## 2016-10-27 DIAGNOSIS — Z7902 Long term (current) use of antithrombotics/antiplatelets: Secondary | ICD-10-CM | POA: Diagnosis not present

## 2016-10-27 DIAGNOSIS — E7800 Pure hypercholesterolemia, unspecified: Secondary | ICD-10-CM | POA: Diagnosis not present

## 2016-10-27 LAB — BASIC METABOLIC PANEL
BUN: 21 mg/dL (ref 7–25)
CO2: 20 mmol/L (ref 20–31)
CREATININE: 0.95 mg/dL — AB (ref 0.60–0.93)
Calcium: 9.4 mg/dL (ref 8.6–10.4)
Chloride: 102 mmol/L (ref 98–110)
GLUCOSE: 77 mg/dL (ref 65–99)
POTASSIUM: 5 mmol/L (ref 3.5–5.3)
Sodium: 132 mmol/L — ABNORMAL LOW (ref 135–146)

## 2016-10-27 LAB — POCT INR: INR: 2

## 2016-10-27 NOTE — Progress Notes (Signed)
Thanks, Suanne Marker! Carrie [name]

## 2016-10-27 NOTE — Progress Notes (Signed)
Cardiology Office Note   Date:  10/27/2016   ID:  JAKIMA SCUDERI, DOB [birth year], MRN [id number]  PCP:  Elyn Peers, MD  Cardiologist:  Dr. Sallyanne Kuster 05/05/2016  Rosaria Ferries, PA-C 10/16/2016  Chief Complaint  Patient presents with  . Follow-up    History of Present Illness: Carrie [name] is a 76 y.o. 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 NSTEMI 02/2016 w/ no sig CAD at cath,  01/26 office visit after her non-STEMI with DES RCA, EF 50-55 percent, patient volume overloaded by exam and was to temporarily increase Lasix, not on aspirin because of the Coumadin  Carrie [name] presents for follow up. She is here with her husband and has her BP/weight chart with her.  She is trying to increase her activity, but is not doing much. She and her husband agree that she is a little stronger, but not where she needs to be. She was at a funeral recently, and had family gatherings so they were not cooking at home as much. She thinks a dry weight would be 132 lbs. She has been there recently, but gained 4 lbs overnight. Her husband believes they got hidden salt in a recent dinner.   She has not had chest pain. She had RUQ abd pain last pm, but it resolved without intervention. No bleeding issues. She still has significant DOE, but the edema has improved (not resolved). She denies orthopnea or PND.   Past Medical History:  Diagnosis Date  . Anticoagulated on Coumadin    for mech valve and atrial fib  goal 2.0-2.5  . Anxiety   . Arthritis    "right shoulder" (03/15/2013)  . Atrial fibrillation ([place])   . Atrial flutter ([place])   . CAD (coronary artery disease) 08/11/2016  . CHF (congestive heart failure) ([place])   . Chronic combined systolic and diastolic CHF (congestive heart failure) ([place])    a. 02/2016 Echo: EF 45-50%.    . Diverticula of colon   . Exertional shortness of breath   . GERD (gastroesophageal reflux disease)   . Heart murmur   . Hemorrhage intraabdominal 03/17/2012  . History of blood transfusion    "once" (03/15/2013)  . Hyperlipidemia   . Hypertension   . Liver hemorrhage   . Migraines   . 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 Bi-leaflet St. Jude mechanical prosthesis; b. 02/2016 Echo: EF 45-50%, some degree of MR, sev dil LA/RA, sev TR.  . NSTEMI (non-ST elevated myocardial infarction) ([place])    a. 02/2016 elev trop/Cath: nonobs dzs,   . Pacemaker    medtronic adapta  . Pneumonia 2009   resolved.? OPD Rx  . Severe tricuspid regurgitation    a. 02/2016 Echo: Ef 45-50%, sev TR, PASP 64mmHg.  . Sick sinus syndrome ([place])    Dr Cristopher Peru. EP study negative. Pacemaker 12/15/06 Medtronic  . Stroke [facility])    "they say I had a stroke last year" denies residual on 03/15/2013  . Subdural hematoma [facility])     Past Surgical History:  Procedure Laterality Date  . APPENDECTOMY    . CARDIAC CATHETERIZATION  04/02/97   R&L:severe MR/pulmonary hypertension  . CARDIAC CATHETERIZATION N/A 03/16/2016   Procedure: Left Heart Cath and Coronary Angiography;  Surgeon: [name], MD;  Location: [place]  CV LAB;  Service: Cardiovascular;  Laterality: N/A;  . CARDIAC CATHETERIZATION N/A 09/30/2016   Procedure: Left Heart Cath and Coronary Angiography;  Surgeon: [name], MD;  Location: [place] CV LAB;  Service: Cardiovascular;  Laterality: N/A;  . CARDIAC CATHETERIZATION N/A 09/30/2016   Procedure: Coronary Stent Intervention;  Surgeon: [name], MD;  3.5 x 12 mm resolute Onyx  to ostial RCA  . CATARACT EXTRACTION W/ INTRAOCULAR LENS  IMPLANT, BILATERAL Bilateral 2013  . DILATION AND CURETTAGE OF UTERUS     "had fibroids" (03/15/2013)  . EXPLORATORY LAPAROTOMY     "had a growth on my intestines" (03/15/2013)  . HAMMER TOE SURGERY Right   . INSERT / REPLACE / REMOVE  PACEMAKER  12/15/2006   Medtronic  . MITRAL VALVE REPLACEMENT  1998   St Jude mechanical; Dr. [name]  . PACEMAKER PLACEMENT  12/15/06   medtronic adapta for SSS  . PERMANENT PACEMAKER GENERATOR CHANGE N/A 07/24/2014   Procedure: PERMANENT PACEMAKER GENERATOR CHANGE;  Surgeon: [name], MD;  Location: [place] CATH LAB;  Service: Cardiovascular;  Laterality: N/A;  . PERSANTINE CARDIOLITE  08/07/03   mild inf. ischemia   . TEE WITHOUT CARDIOVERSION  09/23/2011   Procedure: TRANSESOPHAGEAL ECHOCARDIOGRAM (TEE);  Surgeon: [name];  Location[facility];  Service: Cardiovascular;  Laterality: N/A;  . TONSILLECTOMY    . TUBAL LIGATION    . US ECHOCARDIOGRAPHY  11/19/2011   EF 50-55%,RA mod to severely dilated,LA severely dilated,trace MR,small vegetation or mass on the MV,AOV mildly scleroticmild PI, RV pressure 40-71mmHg    Medication Sig  . atorvastatin (LIPITOR) 40 MG tablet Take 1 tablet (40 mg total) by mouth daily at 6 PM.  . Biotin 1000 MCG tablet Take 1,000 mcg by mouth daily.  . clopidogrel (PLAVIX) 75 MG tablet Take 1 tablet (75 mg total) by mouth daily with breakfast.  . diltiazem (CARTIA XT) 120 MG 24 hr capsule Take 1 capsule (120 mg total) by mouth daily.  Marland Kitchen docusate sodium (COLACE) 100 MG capsule Take 200 mg by mouth 3 (three) times daily.   . furosemide (LASIX) 40 MG tablet Take 1 tablet (40 mg total) by mouth daily.  . metoprolol tartrate (LOPRESSOR) 25 MG tablet Take 1 tablet (25 mg total) by mouth 2 (two) times daily.  . Multiple Vitamin (MULTIVITAMINS PO) Take 1 tablet by mouth daily.   . nitroGLYCERIN (NITROSTAT) 0.4 MG SL tablet Place 1 tablet (0.4 mg total) under the tongue every 5 (five) minutes x 3 doses as needed for chest pain.  . Omega-3 Fatty Acids (FISH OIL) 1000 MG CAPS Take 1,000 mg by mouth daily.  . polyethylene glycol (MIRALAX / GLYCOLAX) packet Take 17 g by mouth as needed (for constipation). Reported on 03/25/2016  . potassium chloride SA  (K-DUR,KLOR-CON) 20 MEQ tablet Take 2 tablets (40 mEq total) by mouth daily.  Marland Kitchen warfarin (COUMADIN) 5 MG tablet TAKE 1 TABLET BY MOUTH DAILY AS DIRECTED (Patient taking differently: TAKE 1 TABLET BY MOUTH DAILY AS DIRECTED take one and a half on mondays and fridays)   No current facility-administered medications for this visit.     Allergies:   Codeine    Social History:  The patient  reports that she has never smoked. She has never used smokeless tobacco. She reports that she does not drink alcohol or use drugs.   Family History:  The patient's family history includes Cancer in her mother and sister; Diabetes in her sister; Healthy in her  brother, brother, brother, and sister; Heart attack in her maternal grandfather; Kidney disease in her daughter; Leukemia in her sister; Stroke in her brother and father.    ROS:  Please see the history of present illness. All other systems are reviewed and negative.    PHYSICAL EXAM: VS:  BP 127/79   Pulse 95   Ht 5\' 1"  (1.549 m)   Wt 137 lb (62.1 kg)   BMI 25.89 kg/m  , BMI Body mass index is 25.89 kg/m. GEN: Well nourished, well developed, female in no acute distress  HEENT: normal for age  Neck: JVD To jaw, no carotid bruit but murmur radiates up, no masses Cardiac: RRR; 2/6 murmur, Crisp valve click, no rubs, or gallops Respiratory:  Bibasilar rales, normal work of breathing GI: soft, nontender, nondistended, + BS MS: no deformity or atrophy; trace-1+ edema; distal pulses are 2+ in all 4 extremities   Skin: warm and dry, no rash Neuro:  Strength and sensation are intact Psych: euthymic mood, full affect   EKG:  EKG is not ordered today.  Recent Labs: 03/21/2016: ALT 28 09/02/2016: Magnesium 2.2 09/30/2016: TSH 1.666 10/07/2016: BUN 22; Creatinine, Ser 1.08; Potassium 5.1; Sodium 133 10/08/2016: Hemoglobin 8.6; Platelets 255    Lipid Panel    Component Value Date/Time   CHOL 144 03/12/2016 0430   TRIG 35 03/12/2016 0430   HDL 49  03/12/2016 0430   CHOLHDL 2.9 03/12/2016 0430   VLDL 7 03/12/2016 0430   LDLCALC 88 03/12/2016 0430     Wt Readings from Last 3 Encounters:  10/27/16 137 lb (62.1 kg)  10/20/16 132 lb (59.9 kg)  10/16/16 137 lb 12.8 oz (62.5 kg)     Other studies Reviewed: Additional studies/ records that were reviewed today include: Office notes and testing.  ASSESSMENT AND PLAN:  1.  Acute on chronic diastolic CHF: Her weight is up on her way chart and on our scales. That said, she feels that she is breathing a little better and doing a little more, supported by her husband. By her weight charts, she improved some and now is trending in a negative direction. We will increase her Lasix again for 3 or 4 days, she feels that her dry weight would be 132 pounds. She understands that his goal. She states that she will work at making sure she does not get hidden salt and really trying to keep the sodium down to 500 mg per meal. Hopefully, she can improve. Follow BMET  2. NSTEMI, subsequent episode of care: She is not on aspirin because of the Coumadin. She is on high-dose statin, Plavix, beta blocker and has sublingual nitroglycerin. Continue these medications at the current doses. She is cleared for cardiac rehabilitation.  3. Hypertension: Her blood pressure is well controlled on current therapy. We will have to be careful that she does not get hypotensive with diuresis.   Current medicines are reviewed at length with the patient today.  The patient does not have concerns regarding medicines.  The following changes have been made:  Increase Lasix temporarily  Labs/ tests ordered today include:   Orders Placed This Encounter  Procedures  . Basic metabolic panel     Disposition:   FU with Dr. Sallyanne Kuster  Signed, Rosaria Ferries, PA-C  10/27/2016 4:58 PM    [place] Phone: [phone]; Fax: [phone]  This note was written with the assistance of speech recognition  software. Please excuse any transcriptional errors.

## 2016-10-27 NOTE — Patient Instructions (Addendum)
Medication Instructions:  INCREASE LASIX 40MG  TWICE DAILY FOR 4 DAYS THEN BACK TO 40MG  DAILY-TARGET WEIGHT IS 132 POUNDS MAY TAKE LASIX 40MG  TWICE DAILY AS-NEEDED   If you need a refill on your cardiac medications before your next appointment, please call your pharmacy.  Labwork: BMET TODAY AT [place] [facility] ON THE 1ST FLOOR  Follow-Up: Your physician recommends that you schedule a follow-up appointment : 11-18-2016 @10AM  WITH DR CROITORU   Special Instructions: INCREASE ACTIVITY TO 5 MINUTES TWICE DAILY AS TOLERATED  CALL ASHLEY AT [facility] RE[facility]AB TO SCHEDULE AN APPOINTMENT [phone]    Thank you for choosing [facility] at [facility], LPN [facility] BARRETT PA-C

## 2016-10-29 ENCOUNTER — Other Ambulatory Visit: Payer: Self-pay

## 2016-10-29 NOTE — Patient Outreach (Signed)
[place] [facility]) Care Management  10/29/16  HAZELINE BACHRACH 03-31-1941 [id number]  Attempted to reach patient without success. Phone rang multiple times with no answer and no option to leave a voicemail.  [place] R. Timmie Dugue, RN, BSN, [place] Management Coordinator [phone]

## 2016-10-30 ENCOUNTER — Other Ambulatory Visit: Payer: Self-pay

## 2016-10-30 DIAGNOSIS — Z7901 Long term (current) use of anticoagulants: Secondary | ICD-10-CM | POA: Diagnosis not present

## 2016-10-30 DIAGNOSIS — I2721 Secondary pulmonary arterial hypertension: Secondary | ICD-10-CM | POA: Diagnosis not present

## 2016-10-30 DIAGNOSIS — E7800 Pure hypercholesterolemia, unspecified: Secondary | ICD-10-CM | POA: Diagnosis not present

## 2016-10-30 DIAGNOSIS — Z952 Presence of prosthetic heart valve: Secondary | ICD-10-CM | POA: Diagnosis not present

## 2016-10-30 DIAGNOSIS — Z950 Presence of cardiac pacemaker: Secondary | ICD-10-CM | POA: Diagnosis not present

## 2016-10-30 DIAGNOSIS — Z7902 Long term (current) use of antithrombotics/antiplatelets: Secondary | ICD-10-CM | POA: Diagnosis not present

## 2016-10-30 DIAGNOSIS — I071 Rheumatic tricuspid insufficiency: Secondary | ICD-10-CM | POA: Diagnosis not present

## 2016-10-30 DIAGNOSIS — I214 Non-ST elevation (NSTEMI) myocardial infarction: Secondary | ICD-10-CM | POA: Diagnosis not present

## 2016-10-30 DIAGNOSIS — I5043 Acute on chronic combined systolic (congestive) and diastolic (congestive) heart failure: Secondary | ICD-10-CM | POA: Diagnosis not present

## 2016-10-30 DIAGNOSIS — I110 Hypertensive heart disease with heart failure: Secondary | ICD-10-CM | POA: Diagnosis not present

## 2016-10-30 DIAGNOSIS — I480 Paroxysmal atrial fibrillation: Secondary | ICD-10-CM | POA: Diagnosis not present

## 2016-10-30 DIAGNOSIS — Z955 Presence of coronary angioplasty implant and graft: Secondary | ICD-10-CM | POA: Diagnosis not present

## 2016-10-30 DIAGNOSIS — I2510 Atherosclerotic heart disease of native coronary artery without angina pectoris: Secondary | ICD-10-CM | POA: Diagnosis not present

## 2016-10-30 NOTE — Telephone Encounter (Signed)
-----   Message from Lonn Georgia, PA-C sent at 10/30/2016 11:16 AM EST ----- Pt of Dr Sallyanne Kuster Please let her know her BMET was ok Decrease Kdur to 1 tab daily Thanks

## 2016-10-30 NOTE — Patient Outreach (Signed)
[place] [facility]) Care Management  10/30/16  Carrie [name] March 13, 1941 [id number]  Successful outreach completed with patient. Patient reported that she is feeling well and that things are going ok. She denies any chest pain or shortness of breath. Stated that her home health nurse had been out today and that visit went well. Patient verbalized that she did attend her appointment with cardiology on 10/27/2016 with Rosaria Ferries PAC. She reported that she had some weight gain, so she is taking extra lasix through Saturday. Patient reported that she does check her blood pressure at home and is watching that.  Patient currently has no concerns or questions. She kept stating that things were going ok. Patient is agreeable to outreach again next week for continued transition of care.  Plan: Follow up next week.  [place] R. Anesha Hackert, RN, BSN, [place] Management Coordinator [phone]

## 2016-10-30 NOTE — Telephone Encounter (Signed)
Call to patient made, changes to meds acknowledged. I've updated med list accordingly to reflect recommendations.

## 2016-11-03 ENCOUNTER — Telehealth: Payer: Self-pay

## 2016-11-03 DIAGNOSIS — I214 Non-ST elevation (NSTEMI) myocardial infarction: Secondary | ICD-10-CM | POA: Diagnosis not present

## 2016-11-03 DIAGNOSIS — Z952 Presence of prosthetic heart valve: Secondary | ICD-10-CM | POA: Diagnosis not present

## 2016-11-03 DIAGNOSIS — Z7902 Long term (current) use of antithrombotics/antiplatelets: Secondary | ICD-10-CM | POA: Diagnosis not present

## 2016-11-03 DIAGNOSIS — I5043 Acute on chronic combined systolic (congestive) and diastolic (congestive) heart failure: Secondary | ICD-10-CM | POA: Diagnosis not present

## 2016-11-03 DIAGNOSIS — I071 Rheumatic tricuspid insufficiency: Secondary | ICD-10-CM | POA: Diagnosis not present

## 2016-11-03 DIAGNOSIS — Z950 Presence of cardiac pacemaker: Secondary | ICD-10-CM | POA: Diagnosis not present

## 2016-11-03 DIAGNOSIS — I2721 Secondary pulmonary arterial hypertension: Secondary | ICD-10-CM | POA: Diagnosis not present

## 2016-11-03 DIAGNOSIS — E7800 Pure hypercholesterolemia, unspecified: Secondary | ICD-10-CM | POA: Diagnosis not present

## 2016-11-03 DIAGNOSIS — Z955 Presence of coronary angioplasty implant and graft: Secondary | ICD-10-CM | POA: Diagnosis not present

## 2016-11-03 DIAGNOSIS — Z7901 Long term (current) use of anticoagulants: Secondary | ICD-10-CM | POA: Diagnosis not present

## 2016-11-03 DIAGNOSIS — I2510 Atherosclerotic heart disease of native coronary artery without angina pectoris: Secondary | ICD-10-CM | POA: Diagnosis not present

## 2016-11-03 DIAGNOSIS — I110 Hypertensive heart disease with heart failure: Secondary | ICD-10-CM | POA: Diagnosis not present

## 2016-11-03 DIAGNOSIS — I480 Paroxysmal atrial fibrillation: Secondary | ICD-10-CM | POA: Diagnosis not present

## 2016-11-03 NOTE — Telephone Encounter (Signed)
Faxed signed skilled nursing orders and medication orders to [facility] on 10/23/16.

## 2016-11-05 DIAGNOSIS — E7800 Pure hypercholesterolemia, unspecified: Secondary | ICD-10-CM | POA: Diagnosis not present

## 2016-11-05 DIAGNOSIS — I2510 Atherosclerotic heart disease of native coronary artery without angina pectoris: Secondary | ICD-10-CM | POA: Diagnosis not present

## 2016-11-05 DIAGNOSIS — Z955 Presence of coronary angioplasty implant and graft: Secondary | ICD-10-CM | POA: Diagnosis not present

## 2016-11-05 DIAGNOSIS — Z950 Presence of cardiac pacemaker: Secondary | ICD-10-CM | POA: Diagnosis not present

## 2016-11-05 DIAGNOSIS — I2721 Secondary pulmonary arterial hypertension: Secondary | ICD-10-CM | POA: Diagnosis not present

## 2016-11-05 DIAGNOSIS — I071 Rheumatic tricuspid insufficiency: Secondary | ICD-10-CM | POA: Diagnosis not present

## 2016-11-05 DIAGNOSIS — I110 Hypertensive heart disease with heart failure: Secondary | ICD-10-CM | POA: Diagnosis not present

## 2016-11-05 DIAGNOSIS — Z7902 Long term (current) use of antithrombotics/antiplatelets: Secondary | ICD-10-CM | POA: Diagnosis not present

## 2016-11-05 DIAGNOSIS — Z952 Presence of prosthetic heart valve: Secondary | ICD-10-CM | POA: Diagnosis not present

## 2016-11-05 DIAGNOSIS — I5043 Acute on chronic combined systolic (congestive) and diastolic (congestive) heart failure: Secondary | ICD-10-CM | POA: Diagnosis not present

## 2016-11-05 DIAGNOSIS — Z7901 Long term (current) use of anticoagulants: Secondary | ICD-10-CM | POA: Diagnosis not present

## 2016-11-05 DIAGNOSIS — I214 Non-ST elevation (NSTEMI) myocardial infarction: Secondary | ICD-10-CM | POA: Diagnosis not present

## 2016-11-05 DIAGNOSIS — I480 Paroxysmal atrial fibrillation: Secondary | ICD-10-CM | POA: Diagnosis not present

## 2016-11-06 ENCOUNTER — Other Ambulatory Visit: Payer: Self-pay

## 2016-11-06 ENCOUNTER — Ambulatory Visit: Payer: Self-pay

## 2016-11-06 NOTE — Patient Outreach (Signed)
[place] [facility]) Care Management  11/06/16  [name] 1941/02/08 [id number]  RNCM attempted to reach patient without success. Phone rang multiple times with no answer and no option to leave a message.  Will attempt to reach patient again within the next week.  Carrie R. Lizmarie Witters, RN, BSN, [place] Management Coordinator [phone]

## 2016-11-10 ENCOUNTER — Other Ambulatory Visit: Payer: Self-pay | Admitting: Cardiovascular Disease

## 2016-11-10 DIAGNOSIS — I480 Paroxysmal atrial fibrillation: Secondary | ICD-10-CM | POA: Diagnosis not present

## 2016-11-10 DIAGNOSIS — I2721 Secondary pulmonary arterial hypertension: Secondary | ICD-10-CM | POA: Diagnosis not present

## 2016-11-10 DIAGNOSIS — I2510 Atherosclerotic heart disease of native coronary artery without angina pectoris: Secondary | ICD-10-CM | POA: Diagnosis not present

## 2016-11-10 DIAGNOSIS — Z950 Presence of cardiac pacemaker: Secondary | ICD-10-CM | POA: Diagnosis not present

## 2016-11-10 DIAGNOSIS — E7800 Pure hypercholesterolemia, unspecified: Secondary | ICD-10-CM | POA: Diagnosis not present

## 2016-11-10 DIAGNOSIS — Z952 Presence of prosthetic heart valve: Secondary | ICD-10-CM | POA: Diagnosis not present

## 2016-11-10 DIAGNOSIS — Z7901 Long term (current) use of anticoagulants: Secondary | ICD-10-CM | POA: Diagnosis not present

## 2016-11-10 DIAGNOSIS — Z7902 Long term (current) use of antithrombotics/antiplatelets: Secondary | ICD-10-CM | POA: Diagnosis not present

## 2016-11-10 DIAGNOSIS — Z955 Presence of coronary angioplasty implant and graft: Secondary | ICD-10-CM | POA: Diagnosis not present

## 2016-11-10 DIAGNOSIS — I110 Hypertensive heart disease with heart failure: Secondary | ICD-10-CM | POA: Diagnosis not present

## 2016-11-10 DIAGNOSIS — I5043 Acute on chronic combined systolic (congestive) and diastolic (congestive) heart failure: Secondary | ICD-10-CM | POA: Diagnosis not present

## 2016-11-10 DIAGNOSIS — I071 Rheumatic tricuspid insufficiency: Secondary | ICD-10-CM | POA: Diagnosis not present

## 2016-11-10 DIAGNOSIS — I214 Non-ST elevation (NSTEMI) myocardial infarction: Secondary | ICD-10-CM | POA: Diagnosis not present

## 2016-11-10 NOTE — Telephone Encounter (Signed)
Rx(s) sent to pharmacy electronically.  

## 2016-11-12 ENCOUNTER — Ambulatory Visit: Payer: Self-pay

## 2016-11-12 DIAGNOSIS — I2510 Atherosclerotic heart disease of native coronary artery without angina pectoris: Secondary | ICD-10-CM | POA: Diagnosis not present

## 2016-11-12 DIAGNOSIS — E7800 Pure hypercholesterolemia, unspecified: Secondary | ICD-10-CM | POA: Diagnosis not present

## 2016-11-12 DIAGNOSIS — Z952 Presence of prosthetic heart valve: Secondary | ICD-10-CM | POA: Diagnosis not present

## 2016-11-12 DIAGNOSIS — Z955 Presence of coronary angioplasty implant and graft: Secondary | ICD-10-CM | POA: Diagnosis not present

## 2016-11-12 DIAGNOSIS — Z950 Presence of cardiac pacemaker: Secondary | ICD-10-CM | POA: Diagnosis not present

## 2016-11-12 DIAGNOSIS — I214 Non-ST elevation (NSTEMI) myocardial infarction: Secondary | ICD-10-CM | POA: Diagnosis not present

## 2016-11-12 DIAGNOSIS — I480 Paroxysmal atrial fibrillation: Secondary | ICD-10-CM | POA: Diagnosis not present

## 2016-11-12 DIAGNOSIS — Z7901 Long term (current) use of anticoagulants: Secondary | ICD-10-CM | POA: Diagnosis not present

## 2016-11-12 DIAGNOSIS — I110 Hypertensive heart disease with heart failure: Secondary | ICD-10-CM | POA: Diagnosis not present

## 2016-11-12 DIAGNOSIS — I5043 Acute on chronic combined systolic (congestive) and diastolic (congestive) heart failure: Secondary | ICD-10-CM | POA: Diagnosis not present

## 2016-11-12 DIAGNOSIS — I071 Rheumatic tricuspid insufficiency: Secondary | ICD-10-CM | POA: Diagnosis not present

## 2016-11-12 DIAGNOSIS — I2721 Secondary pulmonary arterial hypertension: Secondary | ICD-10-CM | POA: Diagnosis not present

## 2016-11-12 DIAGNOSIS — Z7902 Long term (current) use of antithrombotics/antiplatelets: Secondary | ICD-10-CM | POA: Diagnosis not present

## 2016-11-13 ENCOUNTER — Other Ambulatory Visit: Payer: Self-pay

## 2016-11-13 NOTE — Patient Outreach (Signed)
[place] [facility]) Care Management  11/13/16  Carrie [name] 01-29-1941 [id number]  Successful outreach completed with patient. Patient identification verified with two indentifiers. 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   Patient reported that things are still going well. She stated that she has not had any chest pain or shortness of breath She did state that she get short of breath when she is exerting herself. She currently denies any pain or problems. She denies any signs or symptoms of bleeding and reported that she has not had any changes to her coumadin dose. She reported that she is continuing to weigh herself daily and has not gained any weight.  Patient stated she continues to take her medications as prescribed and has no problems or complaints at present. She did report that she received a piece of equipment from [facility] that is not working. RNCM is unaware of any equipment sent to patient, but patient agreeable to home visit to allow RNCM assess the equipment and to check on her.   Plan: Home visit scheduled for next week.  Carrie R. Korry Dalgleish, RN, BSN, [place] Management Coordinator [phone]

## 2016-11-17 ENCOUNTER — Other Ambulatory Visit: Payer: Self-pay

## 2016-11-17 DIAGNOSIS — Z7901 Long term (current) use of anticoagulants: Secondary | ICD-10-CM | POA: Diagnosis not present

## 2016-11-17 DIAGNOSIS — Z950 Presence of cardiac pacemaker: Secondary | ICD-10-CM | POA: Diagnosis not present

## 2016-11-17 DIAGNOSIS — E7800 Pure hypercholesterolemia, unspecified: Secondary | ICD-10-CM | POA: Diagnosis not present

## 2016-11-17 DIAGNOSIS — I110 Hypertensive heart disease with heart failure: Secondary | ICD-10-CM | POA: Diagnosis not present

## 2016-11-17 DIAGNOSIS — I2721 Secondary pulmonary arterial hypertension: Secondary | ICD-10-CM | POA: Diagnosis not present

## 2016-11-17 DIAGNOSIS — I071 Rheumatic tricuspid insufficiency: Secondary | ICD-10-CM | POA: Diagnosis not present

## 2016-11-17 DIAGNOSIS — I214 Non-ST elevation (NSTEMI) myocardial infarction: Secondary | ICD-10-CM | POA: Diagnosis not present

## 2016-11-17 DIAGNOSIS — I2510 Atherosclerotic heart disease of native coronary artery without angina pectoris: Secondary | ICD-10-CM | POA: Diagnosis not present

## 2016-11-17 DIAGNOSIS — I5043 Acute on chronic combined systolic (congestive) and diastolic (congestive) heart failure: Secondary | ICD-10-CM | POA: Diagnosis not present

## 2016-11-17 DIAGNOSIS — Z7902 Long term (current) use of antithrombotics/antiplatelets: Secondary | ICD-10-CM | POA: Diagnosis not present

## 2016-11-17 DIAGNOSIS — Z952 Presence of prosthetic heart valve: Secondary | ICD-10-CM | POA: Diagnosis not present

## 2016-11-17 DIAGNOSIS — I480 Paroxysmal atrial fibrillation: Secondary | ICD-10-CM | POA: Diagnosis not present

## 2016-11-17 DIAGNOSIS — Z955 Presence of coronary angioplasty implant and graft: Secondary | ICD-10-CM | POA: Diagnosis not present

## 2016-11-17 NOTE — Patient Outreach (Signed)
[place] [facility]) Care Management  11/17/2016  Carrie [name] 1940-10-07 [id number]   RNCM attempted to reach patient this morning to confirm appointment today without success. RNCM left HIPAA compliant voicemail on patient's home phone and cell phone.   RNCM attempted again later in the day to contact with patient. Home visit scheduled tentatively for Thursday at 1 pm. RNCM to call first to ensure patient is at home due to other appointments earlier that morning.  [place] R. Parish Dubose, RN, BSN, [place] Management Coordinator [phone]

## 2016-11-18 ENCOUNTER — Encounter: Payer: Self-pay | Admitting: Cardiovascular Disease

## 2016-11-18 ENCOUNTER — Ambulatory Visit (INDEPENDENT_AMBULATORY_CARE_PROVIDER_SITE_OTHER): Payer: Medicare Other | Admitting: Cardiovascular Disease

## 2016-11-18 ENCOUNTER — Encounter (INDEPENDENT_AMBULATORY_CARE_PROVIDER_SITE_OTHER): Payer: Medicare Other

## 2016-11-18 VITALS — BP 121/76 | HR 76 | Ht 61.0 in | Wt 138.0 lb

## 2016-11-18 DIAGNOSIS — I482 Chronic atrial fibrillation: Secondary | ICD-10-CM

## 2016-11-18 DIAGNOSIS — R4189 Other symptoms and signs involving cognitive functions and awareness: Secondary | ICD-10-CM | POA: Diagnosis not present

## 2016-11-18 DIAGNOSIS — Z952 Presence of prosthetic heart valve: Secondary | ICD-10-CM

## 2016-11-18 DIAGNOSIS — I5042 Chronic combined systolic (congestive) and diastolic (congestive) heart failure: Secondary | ICD-10-CM

## 2016-11-18 DIAGNOSIS — Z950 Presence of cardiac pacemaker: Secondary | ICD-10-CM

## 2016-11-18 DIAGNOSIS — I4819 Other persistent atrial fibrillation: Secondary | ICD-10-CM

## 2016-11-18 DIAGNOSIS — Z7901 Long term (current) use of anticoagulants: Secondary | ICD-10-CM | POA: Diagnosis not present

## 2016-11-18 DIAGNOSIS — I2510 Atherosclerotic heart disease of native coronary artery without angina pectoris: Secondary | ICD-10-CM | POA: Diagnosis not present

## 2016-11-18 DIAGNOSIS — I481 Persistent atrial fibrillation: Secondary | ICD-10-CM | POA: Diagnosis not present

## 2016-11-18 DIAGNOSIS — I2721 Secondary pulmonary arterial hypertension: Secondary | ICD-10-CM

## 2016-11-18 DIAGNOSIS — I10 Essential (primary) hypertension: Secondary | ICD-10-CM | POA: Diagnosis not present

## 2016-11-18 DIAGNOSIS — I071 Rheumatic tricuspid insufficiency: Secondary | ICD-10-CM

## 2016-11-18 LAB — CUP PACEART INCLINIC DEVICE CHECK
Date Time Interrogation Session: 20180228160636
Implantable Lead Implant Date: 20080326
Implantable Lead Location: 753860
Implantable Pulse Generator Implant Date: 20151103
Lead Channel Impedance Value: 489 Ohm
Lead Channel Pacing Threshold Amplitude: 1 V
Lead Channel Pacing Threshold Amplitude: 1 V
Lead Channel Pacing Threshold Pulse Width: 0.4 ms
Lead Channel Pacing Threshold Pulse Width: 0.4 ms
MDC IDC MSMT BATTERY IMPEDANCE: 408 Ohm
MDC IDC MSMT BATTERY REMAINING LONGEVITY: 98 mo
MDC IDC MSMT BATTERY VOLTAGE: 2.77 V
MDC IDC MSMT LEADCHNL RA IMPEDANCE VALUE: 0 Ohm
MDC IDC MSMT LEADCHNL RV SENSING INTR AMPL: 5.6 mV
MDC IDC SET LEADCHNL RV PACING AMPLITUDE: 2.5 V
MDC IDC SET LEADCHNL RV PACING PULSEWIDTH: 0.4 ms
MDC IDC SET LEADCHNL RV SENSING SENSITIVITY: 2 mV
MDC IDC STAT BRADY RV PERCENT PACED: 32 %

## 2016-11-18 NOTE — Progress Notes (Signed)
Patient ID: Carrie [name], female   DOB: 12/25/1940, 76 y.o.   MRN: [id number] Patient ID: Carrie [name], female   DOB: 08-15-1941, 76 y.o.   MRN: [id number]    Cardiology Office Note    Date:  11/19/2016   ID:  Carrie [name], DOB 1941/05/04, MRN [id number]  PCP:  Elyn Peers, MD  Cardiologist:   [name], MD   Chief Complaint  Patient presents with  . Follow-up    History of Present Illness:  Carrie [name] is a 76 y.o. 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  She had a cardiac catheterization roughly 6 months earlier that showed only mild plaque in the ostial RCA. She returned with recurrent symptoms and was found to have a high-grade ostial lesion. The peak troponin was only 5. Taking warfarin and clopidogrel. She is not on aspirin. She has not had any problems with angina or shortness of breath since that hospitalization. Her husband mentions problems with her memory and notices that her handwriting, previously beautiful and caligraphic, has become illegible.  She has chronic mild/moderate ankle edema. She does have class II-III exertional dyspnea and her husband believes that she is "slowing down". She is often unsteady when she stands up. She sometimes needs help getting out of a chair. She has not had any falls recently. She has not had any bleeding events, but recently her warfarin anticoagulation has been  subtherapeutic.   Her presenting rhythm today is atrial fibrillation. Pacemaker interrogation shows about 31% ventricular pacing. Ventricular rate control is good with rare episodes of tachycardia and overall the heart rate histogram distribution is acceptable. Estimated generator longevity is 8 years.  Medtronic [place] single chamber pacemaker (implant 2008, generator change 2015) set at lower rate limit of 50 bpm, to try to limit the frequency of ventricular pacing, since this seems to worsen heart failure.  In December 2012 she had subarachnoid hemorrhage and in June 2013 she had intrahepatic hemorrhage requiring arterial embolization and we are keeping her INR in the lower range (2.0-2.5). She has normal coronary arteries by previous angiography (2004) normal left ventricular systolic function by echocardiography (Oct 2015).  Past Medical History:  Diagnosis Date  . Anticoagulated on Coumadin    for mech valve and atrial fib  goal 2.0-2.5  . Anxiety   . Arthritis    "right shoulder" (03/15/2013)  . Atrial fibrillation ([place])   . Atrial flutter ([place])   . CAD (coronary artery disease) 08/11/2016  . CHF (congestive heart failure) ([place])   . Chronic combined systolic and diastolic CHF (congestive heart failure) ([place])    a. 02/2016 Echo: EF 45-50%.  . Diverticula of colon   . Exertional shortness of breath   . GERD (gastroesophageal reflux disease)   . Heart murmur   . Hemorrhage intraabdominal 03/17/2012  . History of blood transfusion    "once" (03/15/2013)  . Hyperlipidemia   . Hypertension   . Liver hemorrhage   . Migraines   . 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 Bi-leaflet  St. Jude mechanical prosthesis; b. 02/2016 Echo: EF 45-50%, some degree of MR, sev dil LA/RA, sev TR.  . NSTEMI (non-ST elevated myocardial infarction) ([place])    a. 02/2016 elev trop/Cath: nonobs dzs,   . Pacemaker    medtronic adapta  . Pneumonia 2009   resolved.? OPD Rx  . Severe tricuspid  regurgitation    a. 02/2016 Echo: Ef 45-50%, sev TR, PASP 30mmHg.  . Sick sinus syndrome ([place])    Dr Cristopher Peru. EP study negative. Pacemaker 12/15/06 Medtronic  . Stroke [facility])    "they say I had a stroke last year" denies residual on 03/15/2013  . Subdural hematoma [facility])     Past Surgical History:  Procedure Laterality Date  . APPENDECTOMY    . CARDIAC CATHETERIZATION  04/02/97   R&L:severe MR/pulmonary hypertension  . CARDIAC CATHETERIZATION N/A 03/16/2016   Procedure: Left Heart Cath and Coronary Angiography;  Surgeon: [name], MD;  Location: [place] [facility];  Service: Cardiovascular;  Laterality: N/A;  . CARDIAC CATHETERIZATION N/A 09/30/2016   Procedure: Left Heart Cath and Coronary Angiography;  Surgeon: [name], MD;  Location: [place] [facility];  Service: Cardiovascular;  Laterality: N/A;  . CARDIAC CATHETERIZATION N/A 09/30/2016   Procedure: Coronary Stent Intervention;  Surgeon: [name], MD;  3.5 x 12 mm resolute Onyx  to ostial RCA  . CATARACT EXTRACTION W/ INTRAOCULAR LENS  IMPLANT, BILATERAL Bilateral 2013  . DILATION AND CURETTAGE OF UTERUS     "had fibroids" (03/15/2013)  . EXPLORATORY LAPAROTOMY     "had a growth on my intestines" (03/15/2013)  . HAMMER TOE SURGERY Right   . INSERT / REPLACE / REMOVE PACEMAKER  12/15/2006   Medtronic  . MITRAL VALVE REPLACEMENT  1998   St Jude mechanical; Dr. [name]  . PACEMAKER PLACEMENT  12/15/06   medtronic adapta for SSS  . PERMANENT PACEMAKER GENERATOR CHANGE N/A 07/24/2014   Procedure: PERMANENT PACEMAKER GENERATOR CHANGE;  Surgeon: [name], MD;  Location: [facility] CATH [facility];  Service: Cardiovascular;  Laterality: N/A;  . PERSANTINE CARDIOLITE  08/07/03   mild inf. ischemia   . TEE WITHOUT CARDIOVERSION  09/23/2011   Procedure: TRANSESOPHAGEAL ECHOCARDIOGRAM (TEE);  Surgeon: [name];  Location: [facility];  Service: Cardiovascular;  Laterality: N/A;  . TONSILLECTOMY    . TUBAL LIGATION      . US ECHOCARDIOGRAPHY  11/19/2011   EF 50-55%,RA mod to severely dilated,LA severely dilated,trace MR,small vegetation or mass on the MV,AOV mildly scleroticmild PI, RV pressure 40-60mmHg    Outpatient Medications Prior to Visit  Medication Sig Dispense Refill  . atorvastatin (LIPITOR) 40 MG tablet Take 1 tablet (40 mg total) by mouth daily at 6 PM. (Patient taking differently: Take 40 mg by mouth daily. ) 30 tablet 12  . Biotin 1000 MCG tablet Take 1,000 mcg by mouth daily.    . clopidogrel (PLAVIX) 75 MG tablet Take 1 tablet (75 mg total) by mouth daily with breakfast. 30 tablet 12  . diltiazem (CARTIA XT) 120 MG 24 hr capsule Take 1 capsule (120 mg total) by mouth daily. 90 capsule 3  . docusate sodium (COLACE) 100 MG capsule Take 200 mg by mouth 2 (two) times daily.     . furosemide (LASIX) 40 MG tablet Take 1 tablet (40 mg total) by mouth daily. 90 tablet 3  . metoprolol tartrate (LOPRESSOR) 25 MG tablet Take 1 tablet (25 mg total) by mouth 2 (two) times daily. 180 tablet 3  .  Multiple Vitamin (MULTIVITAMINS PO) Take 1 tablet by mouth daily.     . nitroGLYCERIN (NITROSTAT) 0.4 MG SL tablet Place 1 tablet (0.4 mg total) under the tongue every 5 (five) minutes x 3 doses as needed for chest pain. 25 tablet 2  . Omega-3 Fatty Acids (FISH OIL) 1000 MG CAPS Take 1,000 mg by mouth daily.    . polyethylene glycol (MIRALAX / GLYCOLAX) packet Take 17 g by mouth as needed (for constipation). Reported on 03/25/2016    . potassium chloride SA (K-DUR,KLOR-CON) 20 MEQ tablet Take 2 tablets (40 mEq total) by mouth daily. (Patient taking differently: Take 20 mEq by mouth daily. ) 180 tablet 3  . warfarin (COUMADIN) 5 MG tablet TAKE 1 TABLET BY MOUTH DAILY AS DIRECTED (Patient taking differently: TAKE 1 TABLET BY MOUTH DAILY AS DIRECTED take one and a half on mondays and fridays) 90 tablet 1   No facility-administered medications prior to visit.      Allergies:   Codeine   Social History   Social  History  . Marital status: Married    Spouse name: N/A  . Number of children: N/A  . Years of education: N/A   Social History Main Topics  . Smoking status: Never Smoker  . Smokeless tobacco: Never Used  . Alcohol use No  . Drug use: No  . Sexual activity: No   Other Topics Concern  . None   Social History Narrative  . None     Family History:  The patient's family history includes Cancer in her mother and sister; Diabetes in her sister; Healthy in her brother, brother, brother, and sister; Heart attack in her maternal grandfather; Kidney disease in her daughter; Leukemia in her sister; Stroke in her brother and father.   ROS:   Please see the history of present illness.    ROS All other systems reviewed and are negative.   PHYSICAL EXAM:   VS:  BP 121/76 (BP Location: Right Arm, Patient Position: Sitting, Cuff Size: Normal)   Pulse 76   Ht 5\' 1"  (1.549 m)   Wt 62.6 kg (138 lb)   BMI 26.07 kg/m    GEN: Well nourished, well developed, in no acute distress  HEENT: normal  Neck: 5-6 cm elevated JVP, very prominent V waves, no carotid bruits or masses Cardiac: Irregular, crisp prosthetic valve clicks; 2/6 holosystolic LLSB murmur;  no diastolic murmurs, rubs, or gallops, no pedal edema, wearing compression stockings  Respiratory:  clear to auscultation bilaterally, normal work of breathing GI: soft, nontender, nondistended, + BS MS: no deformity or atrophy  Skin: warm and dry, no rash Neuro:  Alert and Oriented x 3, Strength and sensation are intact. She smiles a lot and appears oriented, but sometimes seems to be covering up her memory lapses. Psych: euthymic mood, full affect  Wt Readings from Last 3 Encounters:  11/19/16 62.1 kg (137 lb)  11/19/16 63 kg (138 lb 14.2 oz)  11/18/16 62.6 kg (138 lb)      Studies/Labs Reviewed:   EKG:  EKG is not ordered today.    Recent Labs: 03/21/2016: ALT 28 09/02/2016: Magnesium 2.2 09/30/2016: TSH 1.666 10/08/2016: Hemoglobin  8.6; Platelets 255 10/27/2016: BUN 21; Creat 0.95; Potassium 5.0; Sodium 132    ASSESSMENT:    1. Coronary artery disease involving native coronary artery of native heart without angina pectoris   2. Chronic combined systolic and diastolic congestive heart failure ([place])   3. PAH (pulmonary artery hypertension)   4.  Persistent atrial fibrillation ([place])   5. Chronic anticoagulation, ( INR goal 2.0-2.5 due to history of subdural hematoma and liver hemorrhage)   6. Pacemaker - single chamber Medtronic 2008/gen change 2015   7. H/O mitral valve replacement with mechanical valve   8. Moderate to severe tricuspid regurgitation   9. Essential hypertension   10. Cognitive decline      PLAN:  In order of problems listed above:  1. CAD, recent NSTEMI and DES-RCA: Currently on dual therapy with warfarin and Plavix. Has previously had serious hemorrhagic complications in her liver and brain. Although theoretically should continue the Plavix for 12 months, will probably abbreviate the duration of antiplatelet therapy to 3-6 months to avoid serious complications. She does not have any angina. She is getting ready to start cardiac rehabilitation. Retrospectively, her infarction in June 2017 might have been related to the same lesion, although only a mild stenosis was present at that time. I reviewed both coronary angiograms from June in January and that was clear progression of disease at the ostium of the right coronary artery. She is on a highly active statin in last year her LDL was fair at 88. Target LDL< 70. 2. CHF: Seems mildly hypervolemic by exam, NYHA functional class II. Note the fact that she has moderate pulmonary artery hypertension with normal left ventricular end-diastolic pressure (EDP 13 at cath). She may have fixed pulmonary hypertension related to previous mitral valve disease. His echo findings are of right heart failure, not left heart failure. 3. PAH: Normal LVEDP, normally functioning  mitral valve prosthesis. She may have some degree of fixed pulmonary hypertension related to previous mitral valve disease. No clear symptoms of obstructive sleep apnea. There is no history of pulmonary embolism or COPD 4. AFib: I think we have a reasonable balance between avoiding high ventricular rates and avoiding excessive ventricular pacing on the current dose of rate control medication and the current lower rate limit of 50 bpm on her pacemaker. 5. Anticoagulation with previous severe bleeding complications,  keeping INR very tightly in the 2.0-2.5 range 6. PPM: Normal device function, deep at the current device settings. Check every 3 months via either office visit or remote downloads. 7. Mechanical mitral valve replacement: She has a large diameter valve that had normal function by a relatively recent echocardiogram. 8. Moderate to severe TR is probably the major cause of her edema and the jugular venous distention. She may have some degree of rheumatic valve disease and it appears she has the same degree of moderate pulmonary artery hypertension by her most recent echo. The presence of a transvalvular pacemaker lead may also be contributory. Physical exam is compatible with severe tricuspid regurgitation. I don't think should be a great candidate for repeat surgery at age 45, but this opportunity might need to be taken if there is evidence of serious hemodynamic complications or liver abnormalities. 9. HTN is well controlled. 10. Cognitive/memory issues: Advised that they should discuss a Mini-Mental Status Examination with her primary care provider or neurologist     Medication Adjustments/Labs and Tests Ordered: Current medicines are reviewed at length with the patient today.  Concerns regarding medicines are outlined above.  Medication changes, Labs and Tests ordered today are listed in the Patient Instructions below. Patient Instructions  Dr Sallyanne Kuster recommends that you continue on your  current medications as directed. Please refer to the Current Medication list given to you today.  Remote monitoring is used to monitor your Pacemaker of ICD from home.  This monitoring reduces the number of office visits required to check your device to one time per year. It allows [place] to keep an eye on the functioning of your device to ensure it is working properly. You are scheduled for a device check from home on Wednesday, May 30th, 2018. You may send your transmission at any time that day. If you have a wireless device, the transmission will be sent automatically. After your physician reviews your transmission, you will receive a postcard with your next transmission date.  Dr Sallyanne Kuster recommends that you schedule a follow-up appointment in 5 months with a pacemaker check. You will receive a reminder letter in the mail two months in advance. If you don't receive a letter, please call our office to schedule the follow-up appointment.  If you need a refill on your cardiac medications before your next appointment, please call your pharmacy.      Signed, [name], MD  11/19/2016 2:49 PM    [address], [place],   [address] Phone: [phone]; Fax: [phone]

## 2016-11-18 NOTE — Patient Instructions (Addendum)
Dr Sallyanne Kuster recommends that you continue on your current medications as directed. Please refer to the Current Medication list given to you today.  Remote monitoring is used to monitor your Pacemaker of ICD from home. This monitoring reduces the number of office visits required to check your device to one time per year. It allows [place] to keep an eye on the functioning of your device to ensure it is working properly. You are scheduled for a device check from home on Wednesday, May 30th, 2018. You may send your transmission at any time that day. If you have a wireless device, the transmission will be sent automatically. After your physician reviews your transmission, you will receive a postcard with your next transmission date.  Dr Sallyanne Kuster recommends that you schedule a follow-up appointment in 5 months with a pacemaker check. You will receive a reminder letter in the mail two months in advance. If you don't receive a letter, please call our office to schedule the follow-up appointment.  If you need a refill on your cardiac medications before your next appointment, please call your pharmacy.

## 2016-11-19 ENCOUNTER — Encounter (HOSPITAL_COMMUNITY): Payer: Self-pay

## 2016-11-19 ENCOUNTER — Other Ambulatory Visit: Payer: Self-pay | Admitting: Cardiovascular Disease

## 2016-11-19 ENCOUNTER — Other Ambulatory Visit: Payer: Self-pay

## 2016-11-19 ENCOUNTER — Encounter (HOSPITAL_COMMUNITY)
Admission: RE | Admit: 2016-11-19 | Discharge: 2016-11-19 | Disposition: A | Discharge status: Home / Self Care | Payer: Medicare Other | Source: Ambulatory Visit | Attending: Cardiovascular Disease | Admitting: Cardiovascular Disease

## 2016-11-19 VITALS — BP 128/64 | HR 71 | Ht 59.25 in | Wt 138.9 lb

## 2016-11-19 DIAGNOSIS — Z955 Presence of coronary angioplasty implant and graft: Secondary | ICD-10-CM | POA: Diagnosis not present

## 2016-11-19 DIAGNOSIS — I214 Non-ST elevation (NSTEMI) myocardial infarction: Secondary | ICD-10-CM | POA: Diagnosis not present

## 2016-11-19 MED ORDER — NITROGLYCERIN 0.4 MG SL SUBL: 0.4000 mg | SUBLINGUAL_TABLET | SUBLINGUAL | 2 refills | Status: DC | PRN

## 2016-11-19 NOTE — Progress Notes (Signed)
Cardiac Individual Treatment Plan  Patient Details  Name: Carrie [name] MRN: [id number] Date of Birth: 1940/12/13 Referring Provider:   Flowsheet Row CARDIAC REHAB PHASE II ORIENTATION from 11/19/2016 in [facility]  Referring Provider  Croitoru, Mihai MD      Initial Encounter Date:  [place] from 11/19/2016 in [facility]  Date  11/19/16  Referring Provider  Croitoru, Mihai MD      Visit Diagnosis: 09/29/16 NSTEMI (non-ST elevated myocardial infarction) ([place])  09/30/16 Status post coronary artery stent placement  Patient's Home Medications on Admission:  Current Outpatient Prescriptions:  .  atorvastatin (LIPITOR) 40 MG tablet, Take 1 tablet (40 mg total) by mouth daily at 6 PM. (Patient taking differently: Take 40 mg by mouth daily. ), Disp: 30 tablet, Rfl: 12 .  Biotin 1000 MCG tablet, Take 1,000 mcg by mouth daily., Disp: , Rfl:  .  clopidogrel (PLAVIX) 75 MG tablet, Take 1 tablet (75 mg total) by mouth daily with breakfast., Disp: 30 tablet, Rfl: 12 .  diltiazem (CARTIA XT) 120 MG 24 hr capsule, Take 1 capsule (120 mg total) by mouth daily., Disp: 90 capsule, Rfl: 3 .  docusate sodium (COLACE) 100 MG capsule, Take 200 mg by mouth 2 (two) times daily. , Disp: , Rfl:  .  furosemide (LASIX) 40 MG tablet, Take 1 tablet (40 mg total) by mouth daily., Disp: 90 tablet, Rfl: 3 .  metoprolol tartrate (LOPRESSOR) 25 MG tablet, Take 1 tablet (25 mg total) by mouth 2 (two) times daily., Disp: 180 tablet, Rfl: 3 .  Multiple Vitamin (MULTIVITAMINS PO), Take 1 tablet by mouth daily. , Disp: , Rfl:  .  Omega-3 Fatty Acids (FISH OIL) 1000 MG CAPS, Take 1,000 mg by mouth daily., Disp: , Rfl:  .  polyethylene glycol (MIRALAX / GLYCOLAX) packet, Take 17 g by mouth as needed (for constipation). Reported on 03/25/2016, Disp: , Rfl:  .  potassium chloride SA (K-DUR,KLOR-CON) 20 MEQ tablet, Take 2 tablets (40  mEq total) by mouth daily. (Patient taking differently: Take 20 mEq by mouth daily. ), Disp: 180 tablet, Rfl: 3 .  warfarin (COUMADIN) 5 MG tablet, TAKE 1 TABLET BY MOUTH DAILY AS DIRECTED (Patient taking differently: TAKE 1 TABLET BY MOUTH DAILY AS DIRECTED take one and a half on mondays and fridays), Disp: 90 tablet, Rfl: 1 .  nitroGLYCERIN (NITROSTAT) 0.4 MG SL tablet, Place 1 tablet (0.4 mg total) under the tongue every 5 (five) minutes x 3 doses as needed for chest pain., Disp: 25 tablet, Rfl: 2  Past Medical History: Past Medical History:  Diagnosis Date  . Anticoagulated on Coumadin    for mech valve and atrial fib  goal 2.0-2.5  . Anxiety   . Arthritis    "right shoulder" (03/15/2013)  . Atrial fibrillation ([place])   . Atrial flutter ([place])   . CAD (coronary artery disease) 08/11/2016  . CHF (congestive heart failure) ([place])   . Chronic combined systolic and diastolic CHF (congestive heart failure) ([place])    a. 02/2016 Echo: EF 45-50%.  . Diverticula of colon   . Exertional shortness of breath   . GERD (gastroesophageal reflux disease)   . Heart murmur   . Hemorrhage intraabdominal 03/17/2012  . History of blood transfusion    "once" (03/15/2013)  . Hyperlipidemia   . Hypertension   . Liver hemorrhage   . Migraines   . 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 Bi-leaflet [employer] Jude mechanical prosthesis; b. 02/2016 Echo: EF 45-50%, some degree of MR, sev dil LA/RA, sev TR.  . NSTEMI (non-ST elevated myocardial infarction) ([place])    a. 02/2016 elev trop/Cath: nonobs dzs,   . Pacemaker    medtronic adapta  . Pneumonia 2009   resolved.? OPD Rx  . Severe tricuspid regurgitation    a. 02/2016 Echo: Ef 45-50%, sev TR, PASP 74mmHg.  . Sick sinus syndrome ([place])    Dr Cristopher [place]. EP study negative. Pacemaker 12/15/06 Medtronic  . Stroke [facility])    "they say I had a stroke last year" denies residual on 03/15/2013  . Subdural hematoma ([facility])     Tobacco Use: History  Smoking  Status  . Never Smoker  Smokeless Tobacco  . Never Used    Labs: Recent Review Flowsheet Data    Labs for ITP Cardiac and Pulmonary Rehab Latest Ref Rng & Units 09/06/2011 03/15/2013 03/12/2016   Cholestrol 0 - 200 mg/dL - - 144   LDLCALC 0 - 99 mg/dL - - 88   HDL >40 mg/dL - - 49   Trlycerides <150 mg/dL - - 35   Hemoglobin A1c <5.7 % - 5.5 -   PHART 7.350 - 7.400 7.463(H) - -   PCO2ART 35.0 - 45.0 mmHg 31.0(L) - -   HCO3 20.0 - 24.0 mEq/L 21.4 - -   TCO2 0 - 100 mmol/L 18.8 - -   ACIDBASEDEF 0.0 - 2.0 mmol/L 0.4 - -   O2SAT % 94.7 - -      Capillary Blood Glucose: Lab Results  Component Value Date   GLUCAP 136 (H) 03/20/2012   GLUCAP 120 (H) 03/18/2012   GLUCAP 184 (H) 03/16/2012     Exercise Target Goals: Date: 11/19/16  Exercise Program Goal: Individual exercise prescription set with THRR, safety & activity barriers. Participant demonstrates ability to understand and report RPE using BORG scale, to self-measure pulse accurately, and to acknowledge the importance of the exercise prescription.  Exercise Prescription Goal: Starting with aerobic activity 30 plus minutes a day, 3 days per week for initial exercise prescription. Provide home exercise prescription and guidelines that participant acknowledges understanding prior to discharge.  Activity Barriers & Risk Stratification:     Activity Barriers & Cardiac Risk Stratification - 11/19/16 0942      Activity Barriers & Cardiac Risk Stratification   Activity Barriers Arthritis;Deconditioning;Muscular Weakness   Cardiac Risk Stratification High      6 Minute Walk:     6 Minute Walk    Row Name 11/19/16 1227         6 Minute Walk   Phase Initial     Distance 896 feet     Walk Time 6 minutes     # of Rest Breaks 0     MPH 1.69     METS 2.02     RPE 13     VO2 Peak 7.1     Symptoms No     Resting HR 71 bpm     Resting BP 128/64     Max Ex. HR 125 bpm     Max Ex. BP 128/74     2 Minute Post BP  124/80        Oxygen Initial Assessment:   Oxygen Re-Evaluation:   Oxygen Discharge (Final Oxygen Re-Evaluation):   Initial Exercise Prescription:     Initial Exercise Prescription - 11/19/16 1200      Date of Initial Exercise RX and Referring  Provider   Date 11/19/16   Referring Provider Croitoru, Mihai MD     Recumbant Bike   Level 1   Minutes 10   METs 1     NuStep   Level 1   SPM 60   Minutes 10   METs 1     Track   Laps 5   Minutes 10   METs 1.84     Prescription Details   Frequency (times per week) 3   Duration Progress to 30 minutes of continuous aerobic without signs/symptoms of physical distress     Intensity   THRR 40-80% of Max Heartrate 58-117   Ratings of Perceived Exertion 11-13   Perceived Dyspnea 0-4     Progression   Progression Continue to progress workloads to maintain intensity without signs/symptoms of physical distress.     Resistance Training   Training Prescription Yes   Weight 1lb   Reps 10-15      Perform Capillary Blood Glucose checks as needed.  Exercise Prescription Changes:   Exercise Comments:   Exercise Goals and Review:     Exercise Goals    Row Name 11/19/16 0941             Exercise Goals   Increase Physical Activity Yes       Intervention Provide advice, education, support and counseling about physical activity/exercise needs.;Develop an individualized exercise prescription for aerobic and resistive training based on initial evaluation findings, risk stratification, comorbidities and participant's personal goals.       Expected Outcomes Achievement of increased cardiorespiratory fitness and enhanced flexibility, muscular endurance and strength shown through measurements of functional capacity and personal statement of participant.       Increase Strength and Stamina Yes       Intervention Provide advice, education, support and counseling about physical activity/exercise needs.;Develop an individualized  exercise prescription for aerobic and resistive training based on initial evaluation findings, risk stratification, comorbidities and participant's personal goals.       Expected Outcomes Achievement of increased cardiorespiratory fitness and enhanced flexibility, muscular endurance and strength shown through measurements of functional capacity and personal statement of participant.          Exercise Goals Re-Evaluation :    Discharge Exercise Prescription (Final Exercise Prescription Changes):   Nutrition:  Target Goals: Understanding of nutrition guidelines, daily intake of sodium 1500mg , cholesterol 200mg , calories 30% from fat and 7% or less from saturated fats, daily to have 5 or more servings of fruits and vegetables.  Biometrics:     Pre Biometrics - 11/19/16 1236      Pre Biometrics   Waist Circumference 33 inches   Hip Circumference 38 inches   Waist to Hip Ratio 0.87 %   Triceps Skinfold 17 mm   % Body Fat 36.9 %   Grip Strength 25 kg   Flexibility 7.5 in   Single Leg Stand 1.64 seconds       Nutrition Therapy Plan and Nutrition Goals:   Nutrition Discharge: Nutrition Scores:   Nutrition Goals Re-Evaluation:   Nutrition Goals Re-Evaluation:   Nutrition Goals Discharge (Final Nutrition Goals Re-Evaluation):   Psychosocial: Target Goals: Acknowledge presence or absence of significant depression and/or stress, maximize coping skills, provide positive support system. Participant is able to verbalize types and ability to use techniques and skills needed for reducing stress and depression.  Initial Review & Psychosocial Screening:     Initial Psych Review & Screening - 11/19/16 1651  Family Dynamics   Good Support System? Yes     Barriers   Psychosocial barriers to participate in program There are no identifiable barriers or psychosocial needs.     Screening Interventions   Interventions Encouraged to exercise      Quality of Life  Scores:   PHQ-9: Recent Review Flowsheet Data    Depression screen [facility] 2/9 10/20/2016 04/09/2016 01/07/2012 11/26/2011 10/28/2011   Decreased Interest 0 0 0 0 0   Down, Depressed, Hopeless 0 0 0 0 0   PHQ - 2 Score 0 0 0 0 0     Interpretation of Total Score  Total Score Depression Severity:  1-4 = Minimal depression, 5-9 = Mild depression, 10-14 = Moderate depression, 15-19 = Moderately severe depression, 20-27 = Severe depression   Psychosocial Evaluation and Intervention:   Psychosocial Re-Evaluation:   Psychosocial Discharge (Final Psychosocial Re-Evaluation):   Vocational Rehabilitation: Provide vocational rehab assistance to qualifying candidates.   Vocational Rehab Evaluation & Intervention:     Vocational Rehab - 11/19/16 1649      Initial Vocational Rehab Evaluation & Intervention   Assessment shows need for Vocational Rehabilitation No      Education: Education Goals: Education classes will be provided on a weekly basis, covering required topics. Participant will state understanding/return demonstration of topics presented.  Learning Barriers/Preferences:     Learning Barriers/Preferences - 11/19/16 0941      Learning Barriers/Preferences   Learning Barriers Sight   Learning Preferences Written Material;Skilled Demonstration      Education Topics: Count Your Pulse:  -Group instruction provided by verbal instruction, demonstration, patient participation and written materials to support subject.  Instructors address importance of being able to find your pulse and how to count your pulse when at home without a heart monitor.  Patients get hands on experience counting their pulse with staff help and individually.   Heart Attack, Angina, and Risk Factor Modification:  -Group instruction provided by verbal instruction, video, and written materials to support subject.  Instructors address signs and symptoms of angina and heart attacks.    Also discuss risk factors  for heart disease and how to make changes to improve heart health risk factors.   Functional Fitness:  -Group instruction provided by verbal instruction, demonstration, patient participation, and written materials to support subject.  Instructors address safety measures for doing things around the house.  Discuss how to get up and down off the floor, how to pick things up properly, how to safely get out of a chair without assistance, and balance training.   Meditation and Mindfulness:  -Group instruction provided by verbal instruction, patient participation, and written materials to support subject.  Instructor addresses importance of mindfulness and meditation practice to help reduce stress and improve awareness.  Instructor also leads participants through a meditation exercise.    Stretching for Flexibility and Mobility:  -Group instruction provided by verbal instruction, patient participation, and written materials to support subject.  Instructors lead participants through series of stretches that are designed to increase flexibility thus improving mobility.  These stretches are additional exercise for major muscle groups that are typically performed during regular warm up and cool down.   Hands Only CPR Anytime:  -Group instruction provided by verbal instruction, video, patient participation and written materials to support subject.  Instructors co-teach with AHA video for hands only CPR.  Participants get hands on experience with mannequins.   Nutrition I class: Heart Healthy Eating:  -Group instruction provided by PowerPoint slides,  verbal discussion, and written materials to support subject matter. The instructor gives an explanation and review of the Therapeutic Lifestyle Changes diet recommendations, which includes a discussion on lipid goals, dietary fat, sodium, fiber, plant stanol/sterol esters, sugar, and the components of a well-balanced, healthy diet.   Nutrition II class:  Lifestyle Skills:  -Group instruction provided by PowerPoint slides, verbal discussion, and written materials to support subject matter. The instructor gives an explanation and review of label reading, grocery shopping for heart health, heart healthy recipe modifications, and ways to make healthier choices when eating out.   Diabetes Question & Answer:  -Group instruction provided by PowerPoint slides, verbal discussion, and written materials to support subject matter. The instructor gives an explanation and review of diabetes co-morbidities, pre- and post-prandial blood glucose goals, pre-exercise blood glucose goals, signs, symptoms, and treatment of hypoglycemia and hyperglycemia, and foot care basics.   Diabetes Blitz:  -Group instruction provided by PowerPoint slides, verbal discussion, and written materials to support subject matter. The instructor gives an explanation and review of the physiology behind type 1 and type 2 diabetes, diabetes medications and rational behind using different medications, pre- and post-prandial blood glucose recommendations and Hemoglobin A1c goals, diabetes diet, and exercise including blood glucose guidelines for exercising safely.    Portion Distortion:  -Group instruction provided by PowerPoint slides, verbal discussion, written materials, and food models to support subject matter. The instructor gives an explanation of serving size versus portion size, changes in portions sizes over the last 20 years, and what consists of a serving from each food group.   Stress Management:  -Group instruction provided by verbal instruction, video, and written materials to support subject matter.  Instructors review role of stress in heart disease and how to cope with stress positively.     Exercising on Your Own:  -Group instruction provided by verbal instruction, power point, and written materials to support subject.  Instructors discuss benefits of exercise, components  of exercise, frequency and intensity of exercise, and end points for exercise.  Also discuss use of nitroglycerin and activating EMS.  Review options of places to exercise outside of rehab.  Review guidelines for sex with heart disease.   Cardiac Drugs I:  -Group instruction provided by verbal instruction and written materials to support subject.  Instructor reviews cardiac drug classes: antiplatelets, anticoagulants, beta blockers, and statins.  Instructor discusses reasons, side effects, and lifestyle considerations for each drug class.   Cardiac Drugs II:  -Group instruction provided by verbal instruction and written materials to support subject.  Instructor reviews cardiac drug classes: angiotensin converting enzyme inhibitors (ACE-I), angiotensin II receptor blockers (ARBs), nitrates, and calcium channel blockers.  Instructor discusses reasons, side effects, and lifestyle considerations for each drug class.   Anatomy and Physiology of the Circulatory System:  -Group instruction provided by verbal instruction, video, and written materials to support subject.  Reviews functional anatomy of heart, how it relates to various diagnoses, and what role the heart plays in the overall system.   Knowledge Questionnaire Score:     Knowledge Questionnaire Score - 11/19/16 1218      Knowledge Questionnaire Score   Pre Score 17/24      Core Components/Risk Factors/Patient Goals at Admission:     Personal Goals and Risk Factors at Admission - 11/19/16 1242      Core Components/Risk Factors/Patient Goals on Admission   Heart Failure Yes   Intervention Provide a combined exercise and nutrition program that is supplemented with education,  support and counseling about heart failure. Directed toward relieving symptoms such as shortness of breath, decreased exercise tolerance, and extremity edema.   Expected Outcomes Improve functional capacity of life;Short term: Attendance in program 2-3 days a  week with increased exercise capacity. Reported lower sodium intake. Reported increased fruit and vegetable intake. Reports medication compliance.;Short term: Daily weights obtained and reported for increase. Utilizing diuretic protocols set by physician.;Long term: Adoption of self-care skills and reduction of barriers for early signs and symptoms recognition and intervention leading to self-care maintenance.   Hypertension Yes   Intervention Provide education on lifestyle modifcations including regular physical activity/exercise, weight management, moderate sodium restriction and increased consumption of fresh fruit, vegetables, and low fat dairy, alcohol moderation, and smoking cessation.;Monitor prescription use compliance.   Expected Outcomes Short Term: Continued assessment and intervention until BP is < 140/51mm HG in hypertensive participants. < 130/48mm HG in hypertensive participants with diabetes, heart failure or chronic kidney disease.;Long Term: Maintenance of blood pressure at goal levels.   Lipids Yes   Intervention Provide education and support for participant on nutrition & aerobic/resistive exercise along with prescribed medications to achieve LDL 70mg , HDL >40mg .   Expected Outcomes Short Term: Participant states understanding of desired cholesterol values and is compliant with medications prescribed. Participant is following exercise prescription and nutrition guidelines.;Long Term: Cholesterol controlled with medications as prescribed, with individualized exercise RX and with personalized nutrition plan. Value goals: LDL < 70mg , HDL > 40 mg.      Core Components/Risk Factors/Patient Goals Review:    Core Components/Risk Factors/Patient Goals at Discharge (Final Review):    ITP Comments:     ITP Comments    Row Name 11/19/16 0937           ITP Comments Dr.  Loreta Ave, Medical Director          Comments: Jess Barters attended orientation from 0800 to 1000 to review rules  and guidelines for program. Completed 6 minute walk test, Intitial ITP, and exercise prescription.  VSS. Telemetry-Underlying atrial fibrillation with paced rhythm, this has been previously documented .  Asymptomatic.Lorenza used a Radiation protection practitioner for  stability. Abaigeal plans to participate in 18 sessions due to her copay.Barnet Pall, RN,BSN 11/19/2016 4:58 PM

## 2016-11-19 NOTE — Patient Outreach (Addendum)
[facility]) Care Management  [place]  11/19/2016   Carrie [name] Feb 06, 1941 [id number]  Subjective: Home visit completed with patient.  Objective: Blood pressure 118/64, pulse (!) 55, weight 137 lb (62.1 kg), SpO2 97 %.  Encounter Medications:  Outpatient Encounter Prescriptions as of 11/19/2016  Medication Sig  . atorvastatin (LIPITOR) 40 MG tablet Take 1 tablet (40 mg total) by mouth daily at 6 PM. (Patient taking differently: Take 40 mg by mouth daily. )  . Biotin 1000 MCG tablet Take 1,000 mcg by mouth daily.  . clopidogrel (PLAVIX) 75 MG tablet Take 1 tablet (75 mg total) by mouth daily with breakfast.  . diltiazem (CARTIA XT) 120 MG 24 hr capsule Take 1 capsule (120 mg total) by mouth daily.  Marland Kitchen docusate sodium (COLACE) 100 MG capsule Take 200 mg by mouth 2 (two) times daily.   . furosemide (LASIX) 40 MG tablet Take 1 tablet (40 mg total) by mouth daily.  . metoprolol tartrate (LOPRESSOR) 25 MG tablet Take 1 tablet (25 mg total) by mouth 2 (two) times daily.  . Multiple Vitamin (MULTIVITAMINS PO) Take 1 tablet by mouth daily.   . nitroGLYCERIN (NITROSTAT) 0.4 MG SL tablet Place 1 tablet (0.4 mg total) under the tongue every 5 (five) minutes x 3 doses as needed for chest pain.  . Omega-3 Fatty Acids (FISH OIL) 1000 MG CAPS Take 1,000 mg by mouth daily.  . polyethylene glycol (MIRALAX / GLYCOLAX) packet Take 17 g by mouth as needed (for constipation). Reported on 03/25/2016  . potassium chloride SA (K-DUR,KLOR-CON) 20 MEQ tablet Take 2 tablets (40 mEq total) by mouth daily. (Patient taking differently: Take 20 mEq by mouth daily. )  . warfarin (COUMADIN) 5 MG tablet TAKE 1 TABLET BY MOUTH DAILY AS DIRECTED (Patient taking differently: TAKE 1 TABLET BY MOUTH DAILY AS DIRECTED take one and a half on mondays and fridays)   No facility-administered encounter medications on file as of 11/19/2016.     Functional Status:  In your present state of health, do you have  any difficulty performing the following activities: 10/20/2016 09/30/2016  Hearing? N N  Vision? N N  Difficulty concentrating or making decisions? N N  Walking or climbing stairs? N N  Dressing or bathing? N N  Doing errands, shopping? Y N  Preparing Food and eating ? N -  Using the Toilet? N -  In the past six months, have you accidently leaked urine? N -  Do you have problems with loss of bowel control? N -  Managing your Medications? N -  Managing your Finances? N -  Housekeeping or managing your Housekeeping? Y -  Some recent data might be hidden    Fall/Depression Screening: PHQ 2/9 Scores 10/20/2016 04/09/2016 01/07/2012 11/26/2011 10/28/2011  PHQ - 2 Score 0 0 0 0 0    Assessment: Patient continues to do well after discharge. She is monitoring her weights daily and knows to report any changes to her doctor. Patient's weight has been remaining around 137 since discharge. She denies any pain, chest pain or shortness of breath. She does not have any increased edema in her extremities.  Patient reported that she had received a set of scales. Upon investigation, she had received a set of scales via [facility] with instructions to weight daily. RNCM educated patient that this was not a service of [facility] and that nurse from [place] would be reaching out to her. She questioned if she was supposed  to send it back yet and RNCM encouraged patient to continue using it until she was instructed to send it back by a nurse from her insurance company. Patient reported that it was not working right. RNCM assisted patient with setting up the unit and demonstrated to patient what do to and that she needs to do this daily in the morning. Patient verbalized understanding and thanked RNCM for showing her how to get it to work.  Patient has no other questions or concerns at present.    Plan: RNCM will follow up within next month.  [place] R. Mckennon Zwart, RN, BSN, [place] Management  Coordinator [phone]

## 2016-11-19 NOTE — Progress Notes (Signed)
Cardiac Rehab Medication Review by a Pharmacist  Does the patient  feel that his/her medications are working for him/her?  yes  Has the patient been experiencing any side effects to the medications prescribed?  Yes, having some left calf pain  Does the patient measure his/her own blood pressure or blood glucose at home?  yes   Does the patient have any problems obtaining medications due to transportation or finances?   no  Understanding of regimen: good Understanding of indications: fair Potential of compliance: good   Pharmacist comments: 39 yoF who presents in good spirits. She is aware of each of the medications and we spent time discussing potential side effects with different ones, including warfarin and plavix. She did express some concern with her ongoing constipation and whether she can use miralax. I informed her that if she gets the the point she is using it daily, then she should contact her doctor. Additionally, she is reporting left calf pain recently. Unsure if this is atorvastatin since it is not bilateral or located in the thighs. Will inform nurse.    Dierdre Harness, Cain Sieve, PharmD Clinical Pharmacy Resident [phone] (Pager) 11/19/2016 9:02 AM

## 2016-11-20 NOTE — Telephone Encounter (Signed)
Rx(s) sent to pharmacy electronically.  

## 2016-11-23 ENCOUNTER — Ambulatory Visit (INDEPENDENT_AMBULATORY_CARE_PROVIDER_SITE_OTHER): Payer: Medicare Other | Admitting: Pharmacist

## 2016-11-23 DIAGNOSIS — I482 Chronic atrial fibrillation, unspecified: Secondary | ICD-10-CM

## 2016-11-23 DIAGNOSIS — Z7901 Long term (current) use of anticoagulants: Secondary | ICD-10-CM

## 2016-11-23 DIAGNOSIS — Z952 Presence of prosthetic heart valve: Secondary | ICD-10-CM | POA: Diagnosis not present

## 2016-11-23 LAB — POCT INR: INR: 2.1

## 2016-11-25 ENCOUNTER — Encounter (HOSPITAL_COMMUNITY)
Admission: RE | Admit: 2016-11-25 | Discharge: 2016-11-25 | Disposition: A | Discharge status: Home / Self Care | Payer: Medicare Other | Source: Ambulatory Visit | Attending: Cardiovascular Disease | Admitting: Cardiovascular Disease

## 2016-11-25 ENCOUNTER — Encounter (HOSPITAL_COMMUNITY): Payer: Self-pay

## 2016-11-25 DIAGNOSIS — Z955 Presence of coronary angioplasty implant and graft: Secondary | ICD-10-CM

## 2016-11-25 DIAGNOSIS — I214 Non-ST elevation (NSTEMI) myocardial infarction: Secondary | ICD-10-CM | POA: Diagnosis not present

## 2016-11-25 NOTE — Progress Notes (Signed)
Daily Session Note  Patient Details  Name: Carrie [name] MRN: [id number] Date of Birth: November 04, 1940 Referring Provider:   Flowsheet Row CARDIAC REHAB PHASE II ORIENTATION from 11/19/2016 in [facility]  Referring Provider  [name] MD      Encounter Date: 11/25/2016  Check In:     Session Check In - 11/25/16 1408      Check-In   Location [facility]   Staff Present Seward Carol, MS, ACSM CEP, Exercise Physiologist;Amber Fair, MS, ACSM RCEP, Exercise Physiologist;Danett Palazzo, RN, BSN;Other   Supervising physician immediately available to respond to emergencies [facility] immediately available   Physician(s) Dr. Tana Coast   Medication changes reported     No   Fall or balance concerns reported    No   Tobacco Cessation No Change   Warm-up and Cool-down Performed as group-led instruction   Resistance Training Performed No   VAD Patient? No     Pain Assessment   Currently in Pain? No/denies   Multiple Pain Sites No      Capillary Blood Glucose: No results found for this or any previous visit (from the past 24 hour(s)).    History  Smoking Status  . Never Smoker  Smokeless Tobacco  . Never Used    Goals Met:  Exercise tolerated well  Goals Unmet:  Not Applicable  Comments: Pt started cardiac rehab today.  Pt tolerated light exercise without difficulty. VSS, telemetry-Ventricular paced,  asymptomatic.  Medication list reconciled. Pt denies barriers to medicaiton compliance.  PSYCHOSOCIAL ASSESSMENT:  PHQ-1, health related anxiety. Pt exhibits decreased  coping skills, uncertain outlook.  Pt has 2 children with 6 grandchildren, that unfortunately live throughout the [place]. Will continue to monitor pt mood, outlook and affect for improvement with cardiac rehab participation.      Pt enjoys participating in bible study at [facility].    Pt oriented to exercise equipment and routine.    Understanding  verbalized.   Dr. Fransico Him is Medical Director for Cardiac Rehab at [facility].

## 2016-11-27 ENCOUNTER — Encounter (HOSPITAL_COMMUNITY)
Admission: RE | Admit: 2016-11-27 | Discharge: 2016-11-27 | Disposition: A | Discharge status: Home / Self Care | Payer: Medicare Other | Source: Ambulatory Visit | Attending: Cardiovascular Disease | Admitting: Cardiovascular Disease

## 2016-11-27 DIAGNOSIS — Z955 Presence of coronary angioplasty implant and graft: Secondary | ICD-10-CM | POA: Diagnosis not present

## 2016-11-27 DIAGNOSIS — I214 Non-ST elevation (NSTEMI) myocardial infarction: Secondary | ICD-10-CM | POA: Diagnosis not present

## 2016-11-29 ENCOUNTER — Telehealth: Payer: Self-pay

## 2016-11-29 NOTE — Telephone Encounter (Signed)
Spoke with patient and she is aware of her new appt due to cme 3/21  Carrie [name]

## 2016-12-02 ENCOUNTER — Encounter (HOSPITAL_COMMUNITY)
Admission: RE | Admit: 2016-12-02 | Discharge: 2016-12-02 | Disposition: A | Discharge status: Home / Self Care | Payer: Medicare Other | Source: Ambulatory Visit | Attending: Cardiovascular Disease | Admitting: Cardiovascular Disease

## 2016-12-02 DIAGNOSIS — Z955 Presence of coronary angioplasty implant and graft: Secondary | ICD-10-CM

## 2016-12-02 DIAGNOSIS — I214 Non-ST elevation (NSTEMI) myocardial infarction: Secondary | ICD-10-CM

## 2016-12-04 ENCOUNTER — Encounter (HOSPITAL_COMMUNITY)
Admission: RE | Admit: 2016-12-04 | Discharge: 2016-12-04 | Disposition: A | Discharge status: Home / Self Care | Payer: Medicare Other | Source: Ambulatory Visit | Attending: Cardiovascular Disease | Admitting: Cardiovascular Disease

## 2016-12-04 DIAGNOSIS — Z955 Presence of coronary angioplasty implant and graft: Secondary | ICD-10-CM | POA: Diagnosis not present

## 2016-12-04 DIAGNOSIS — I214 Non-ST elevation (NSTEMI) myocardial infarction: Secondary | ICD-10-CM

## 2016-12-09 ENCOUNTER — Ambulatory Visit: Payer: Medicare Other | Admitting: Hematology and Oncology

## 2016-12-09 ENCOUNTER — Encounter (HOSPITAL_COMMUNITY)
Admission: RE | Admit: 2016-12-09 | Discharge: 2016-12-09 | Disposition: A | Discharge status: Home / Self Care | Payer: Medicare Other | Source: Ambulatory Visit | Attending: Cardiovascular Disease | Admitting: Cardiovascular Disease

## 2016-12-09 ENCOUNTER — Other Ambulatory Visit: Payer: Medicare Other

## 2016-12-09 DIAGNOSIS — I214 Non-ST elevation (NSTEMI) myocardial infarction: Secondary | ICD-10-CM

## 2016-12-09 DIAGNOSIS — Z955 Presence of coronary angioplasty implant and graft: Secondary | ICD-10-CM | POA: Diagnosis not present

## 2016-12-09 NOTE — Progress Notes (Signed)
Carrie [name] 76 y.o. female Nutrition Note Spoke with pt. Nutrition Plan and Nutrition Survey goals reviewed with pt. Pt is following Step 2 of the Therapeutic Lifestyle Changes diet. Pt states she is watching sodium, but per discussion, pt is eating some high sodium foods (e.g.Polish sausages, [place] sausage, lower sodium cream soup, frequently eating out). Limiting sodium in the diet and dietary sources of sodium reviewed with pt. Pt reports she does not have a great appetite and has been drinking 1 Ensure daily with breakfast. Pt is on Coumadin and reports following a diet with consistent vitamin K intake. Pt expressed understanding of the information reviewed. Pt aware of nutrition education classes offered and is unable to attend nutrition classes.  Lab Results  Component Value Date   HGBA1C 5.5 03/15/2013   Wt Readings from Last 3 Encounters:  11/19/16 138 lb 14.2 oz (63 kg)  11/19/16 137 lb (62.1 kg)  11/18/16 138 lb (62.6 kg)   Nutrition Diagnosis ? Food-and nutrition-related knowledge deficit related to lack of exposure to information as related to diagnosis of: ? CHF  Nutrition Intervention ? Pt's individual nutrition plan reviewed with pt. ? Benefits of adopting Therapeutic Lifestyle Changes discussed when Medficts reviewed. ? Pt to attend the Portion Distortion class ? Pt to attend the   ? Nutrition I class                  ? Nutrition II class ? Pt given handouts for: ? Nutrition I class ? Nutrition II class ? Low Sodium Nutrition Therapy  ? Continue client-centered nutrition education by RD, as part of interdisciplinary care. Goal(s) ? Pt to identify and limit food sources of sodium ? Pt to describe the benefit of including fruits, vegetables, whole grains, and low-fat dairy products in a heart healthy meal plan. Monitor and Evaluate progress toward nutrition goal with team. Derek Mound, M.Ed, RD, LDN, CDE 12/09/2016 2:35 PM

## 2016-12-11 ENCOUNTER — Encounter (HOSPITAL_COMMUNITY)
Admission: RE | Admit: 2016-12-11 | Discharge: 2016-12-11 | Disposition: A | Discharge status: Home / Self Care | Payer: Medicare Other | Source: Ambulatory Visit | Attending: Cardiovascular Disease | Admitting: Cardiovascular Disease

## 2016-12-11 DIAGNOSIS — I214 Non-ST elevation (NSTEMI) myocardial infarction: Secondary | ICD-10-CM | POA: Diagnosis not present

## 2016-12-11 DIAGNOSIS — Z955 Presence of coronary angioplasty implant and graft: Secondary | ICD-10-CM

## 2016-12-14 ENCOUNTER — Ambulatory Visit: Payer: Self-pay

## 2016-12-16 ENCOUNTER — Encounter (HOSPITAL_COMMUNITY)
Admission: RE | Admit: 2016-12-16 | Discharge: 2016-12-16 | Disposition: A | Discharge status: Home / Self Care | Payer: Medicare Other | Source: Ambulatory Visit | Attending: Cardiovascular Disease | Admitting: Cardiovascular Disease

## 2016-12-16 ENCOUNTER — Other Ambulatory Visit: Payer: Self-pay | Admitting: Emergency Medicine

## 2016-12-16 ENCOUNTER — Encounter (HOSPITAL_COMMUNITY): Payer: Self-pay

## 2016-12-16 DIAGNOSIS — I214 Non-ST elevation (NSTEMI) myocardial infarction: Secondary | ICD-10-CM | POA: Diagnosis not present

## 2016-12-16 DIAGNOSIS — Z955 Presence of coronary angioplasty implant and graft: Secondary | ICD-10-CM | POA: Diagnosis not present

## 2016-12-16 DIAGNOSIS — D696 Thrombocytopenia, unspecified: Secondary | ICD-10-CM

## 2016-12-16 NOTE — Progress Notes (Signed)
Cardiac Individual Treatment Plan  Patient Details  Name: Carrie [name] MRN: [id number] Date of Birth: 1941-05-18 Referring Provider:     CARDIAC REHAB PHASE II ORIENTATION from 11/19/2016 in [place]  Referring Provider  Croitoru, Mihai MD      Initial Encounter Date:    [place] from 11/19/2016 in [place]  Date  11/19/16  Referring Provider  Croitoru, Mihai MD      Visit Diagnosis: 09/29/16 NSTEMI (non-ST elevated myocardial infarction) ([place])  09/30/16 Status post coronary artery stent placement  Patient's Home Medications on Admission:  Current Outpatient Prescriptions:  .  atorvastatin (LIPITOR) 40 MG tablet, Take 1 tablet (40 mg total) by mouth daily at 6 PM. (Patient taking differently: Take 40 mg by mouth daily. ), Disp: 30 tablet, Rfl: 12 .  Biotin 1000 MCG tablet, Take 1,000 mcg by mouth daily., Disp: , Rfl:  .  clopidogrel (PLAVIX) 75 MG tablet, Take 1 tablet (75 mg total) by mouth daily with breakfast., Disp: 30 tablet, Rfl: 12 .  diltiazem (CARTIA XT) 120 MG 24 hr capsule, Take 1 capsule (120 mg total) by mouth daily., Disp: 90 capsule, Rfl: 3 .  docusate sodium (COLACE) 100 MG capsule, Take 200 mg by mouth 2 (two) times daily. , Disp: , Rfl:  .  furosemide (LASIX) 40 MG tablet, Take 1 tablet (40 mg total) by mouth daily., Disp: 90 tablet, Rfl: 3 .  metoprolol tartrate (LOPRESSOR) 25 MG tablet, Take 1 tablet (25 mg total) by mouth 2 (two) times daily., Disp: 180 tablet, Rfl: 3 .  Multiple Vitamin (MULTIVITAMINS PO), Take 1 tablet by mouth daily. , Disp: , Rfl:  .  nitroGLYCERIN (NITROSTAT) 0.4 MG SL tablet, Place 1 tablet (0.4 mg total) under the tongue every 5 (five) minutes as needed for chest pain., Disp: 225 tablet, Rfl: 0 .  Omega-3 Fatty Acids (FISH OIL) 1000 MG CAPS, Take 1,000 mg by mouth daily., Disp: , Rfl:  .  polyethylene glycol (MIRALAX / GLYCOLAX) packet, Take 17 g by  mouth as needed (for constipation). Reported on 03/25/2016, Disp: , Rfl:  .  potassium chloride SA (K-DUR,KLOR-CON) 20 MEQ tablet, Take 2 tablets (40 mEq total) by mouth daily. (Patient taking differently: Take 20 mEq by mouth daily. ), Disp: 180 tablet, Rfl: 3 .  warfarin (COUMADIN) 5 MG tablet, TAKE 1 TABLET BY MOUTH DAILY AS DIRECTED (Patient taking differently: TAKE 1 TABLET BY MOUTH DAILY AS DIRECTED take one and a half on mondays and fridays), Disp: 90 tablet, Rfl: 1  Past Medical History: Past Medical History:  Diagnosis Date  . Anticoagulated on Coumadin    for mech valve and atrial fib  goal 2.0-2.5  . Anxiety   . Arthritis    "right shoulder" (03/15/2013)  . Atrial fibrillation ([place])   . Atrial flutter ([place])   . CAD (coronary artery disease) 08/11/2016  . CHF (congestive heart failure) ([place])   . Chronic combined systolic and diastolic CHF (congestive heart failure) ([place])    a. 02/2016 Echo: EF 45-50%.  . Diverticula of colon   . Exertional shortness of breath   . GERD (gastroesophageal reflux disease)   . Heart murmur   . Hemorrhage intraabdominal 03/17/2012  . History of blood transfusion    "once" (03/15/2013)  . Hyperlipidemia   . Hypertension   . Liver hemorrhage   . Migraines   . 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  Bi-leaflet St[employer] Jude mechanical prosthesis; b. 02/2016 Echo: EF 45-50%, some degree of MR, sev dil LA/RA, sev TR.  . NSTEMI (non-ST elevated myocardial infarction) ([place])    a. 02/2016 elev trop/Cath: nonobs dzs,   . Pacemaker    medtronic adapta  . Pneumonia 2009   resolved.? OPD Rx  . Severe tricuspid regurgitation    a. 02/2016 Echo: Ef 45-50%, sev TR, PASP 35mmHg.  . Sick sinus syndrome ([place])    Dr Cristopher Peru. EP study negative. Pacemaker 12/15/06 Medtronic  . Stroke [facility])    "they say I had a stroke last year" denies residual on 03/15/2013  . Subdural hematoma ([facility])     Tobacco Use: History  Smoking Status  . Never Smoker   Smokeless Tobacco  . Never Used    Labs: Recent Review Flowsheet Data    Labs for ITP Cardiac and Pulmonary Rehab Latest Ref Rng & Units 09/06/2011 03/15/2013 03/12/2016   Cholestrol 0 - 200 mg/dL - - 144   LDLCALC 0 - 99 mg/dL - - 88   HDL >40 mg/dL - - 49   Trlycerides <150 mg/dL - - 35   Hemoglobin A1c <5.7 % - 5.5 -   PHART 7.350 - 7.400 7.463(H) - -   PCO2ART 35.0 - 45.0 mmHg 31.0(L) - -   HCO3 20.0 - 24.0 mEq/L 21.4 - -   TCO2 0 - 100 mmol/L 18.8 - -   ACIDBASEDEF 0.0 - 2.0 mmol/L 0.4 - -   O2SAT % 94.7 - -      Capillary Blood Glucose: Lab Results  Component Value Date   GLUCAP 136 (H) 03/20/2012   GLUCAP 120 (H) 03/18/2012   GLUCAP 184 (H) 03/16/2012     Exercise Target Goals:    Exercise Program Goal: Individual exercise prescription set with THRR, safety & activity barriers. Participant demonstrates ability to understand and report RPE using BORG scale, to self-measure pulse accurately, and to acknowledge the importance of the exercise prescription.  Exercise Prescription Goal: Starting with aerobic activity 30 plus minutes a day, 3 days per week for initial exercise prescription. Provide home exercise prescription and guidelines that participant acknowledges understanding prior to discharge.  Activity Barriers & Risk Stratification:     Activity Barriers & Cardiac Risk Stratification - 11/19/16 0942      Activity Barriers & Cardiac Risk Stratification   Activity Barriers Arthritis;Deconditioning;Muscular Weakness   Cardiac Risk Stratification High      6 Minute Walk:     6 Minute Walk    Row Name 11/19/16 1227         6 Minute Walk   Phase Initial     Distance 896 feet     Walk Time 6 minutes     # of Rest Breaks 0     MPH 1.69     METS 2.02     RPE 13     VO2 Peak 7.1     Symptoms No     Resting HR 71 bpm     Resting BP 128/64     Max Ex. HR 125 bpm     Max Ex. BP 128/74     2 Minute Post BP 124/80        Oxygen Initial  Assessment:   Oxygen Re-Evaluation:   Oxygen Discharge (Final Oxygen Re-Evaluation):   Initial Exercise Prescription:     Initial Exercise Prescription - 11/19/16 1200      Date of Initial Exercise RX and Referring Provider  Date 11/19/16   Referring Provider Croitoru, Mihai MD     Recumbant Bike   Level 1   Minutes 10   METs 1     NuStep   Level 1   SPM 60   Minutes 10   METs 1     Track   Laps 5   Minutes 10   METs 1.84     Prescription Details   Frequency (times per week) 3   Duration Progress to 30 minutes of continuous aerobic without signs/symptoms of physical distress     Intensity   THRR 40-80% of Max Heartrate 58-117   Ratings of Perceived Exertion 11-13   Perceived Dyspnea 0-4     Progression   Progression Continue to progress workloads to maintain intensity without signs/symptoms of physical distress.     Resistance Training   Training Prescription Yes   Weight 1lb   Reps 10-15      Perform Capillary Blood Glucose checks as needed.  Exercise Prescription Changes:      Exercise Prescription Changes    Row Name 12/02/16 1500 12/15/16 1500           Response to Exercise   Blood Pressure (Admit) 128/70 122/82      Blood Pressure (Exercise) 126/70 122/78      Blood Pressure (Exit) 114/62 126/78      Heart Rate (Admit) 80 bpm 76 bpm      Heart Rate (Exercise) 105 bpm 103 bpm      Heart Rate (Exit) 74 bpm 76 bpm      Rating of Perceived Exertion (Exercise) 14 13      Symptoms none none      Duration Continue with 30 min of aerobic exercise without signs/symptoms of physical distress. Continue with 30 min of aerobic exercise without signs/symptoms of physical distress.      Intensity THRR unchanged THRR unchanged        Progression   Progression Continue to progress workloads to maintain intensity without signs/symptoms of physical distress. Continue to progress workloads to maintain intensity without signs/symptoms of physical  distress.        Resistance Training   Training Prescription Yes Yes      Weight 1lb 1lb      Reps 10-15 10-15      Time 10 Minutes 10 Minutes        Recumbant Bike   Level 1 1      Minutes 15 15      METs 1 2.24        NuStep   Level  - 2      SPM  - 60      Minutes  - 8      METs  - 1.3        Track   Laps 8 4      Minutes 15 7      METs 1.84 2.09         Exercise Comments:      Exercise Comments    Row Name 12/09/16 1456           Exercise Comments Reviewed METs and goals. Pt is tolerating exercise fairly well; will continue to monitor exercise progression.          Exercise Goals and Review:      Exercise Goals    Row Name 11/19/16 0941             Exercise Goals   Increase Physical  Activity Yes       Intervention Provide advice, education, support and counseling about physical activity/exercise needs.;Develop an individualized exercise prescription for aerobic and resistive training based on initial evaluation findings, risk stratification, comorbidities and participant's personal goals.       Expected Outcomes Achievement of increased cardiorespiratory fitness and enhanced flexibility, muscular endurance and strength shown through measurements of functional capacity and personal statement of participant.       Increase Strength and Stamina Yes       Intervention Provide advice, education, support and counseling about physical activity/exercise needs.;Develop an individualized exercise prescription for aerobic and resistive training based on initial evaluation findings, risk stratification, comorbidities and participant's personal goals.       Expected Outcomes Achievement of increased cardiorespiratory fitness and enhanced flexibility, muscular endurance and strength shown through measurements of functional capacity and personal statement of participant.          Exercise Goals Re-Evaluation :     Exercise Goals Re-Evaluation    Row Name 12/09/16  1452             Exercise Goal Re-Evaluation   Exercise Goals Review Increase Physical Activity;Increase Strenth and Stamina       Comments Pt is progressing towards 30 minutes of continuous exercise. Pt stated "stamina still needs improvement"        Expected Outcomes Pt will work towards 30 minutes of continuous exercise and improve in strength and stamina.           Discharge Exercise Prescription (Final Exercise Prescription Changes):     Exercise Prescription Changes - 12/15/16 1500      Response to Exercise   Blood Pressure (Admit) 122/82   Blood Pressure (Exercise) 122/78   Blood Pressure (Exit) 126/78   Heart Rate (Admit) 76 bpm   Heart Rate (Exercise) 103 bpm   Heart Rate (Exit) 76 bpm   Rating of Perceived Exertion (Exercise) 13   Symptoms none   Duration Continue with 30 min of aerobic exercise without signs/symptoms of physical distress.   Intensity THRR unchanged     Progression   Progression Continue to progress workloads to maintain intensity without signs/symptoms of physical distress.     Resistance Training   Training Prescription Yes   Weight 1lb   Reps 10-15   Time 10 Minutes     Recumbant Bike   Level 1   Minutes 15   METs 2.24     NuStep   Level 2   SPM 60   Minutes 8   METs 1.3     Track   Laps 4   Minutes 7   METs 2.09      Nutrition:  Target Goals: Understanding of nutrition guidelines, daily intake of sodium 1500mg , cholesterol 200mg , calories 30% from fat and 7% or less from saturated fats, daily to have 5 or more servings of fruits and vegetables.  Biometrics:     Pre Biometrics - 11/19/16 1236      Pre Biometrics   Waist Circumference 33 inches   Hip Circumference 38 inches   Waist to Hip Ratio 0.87 %   Triceps Skinfold 17 mm   % Body Fat 36.9 %   Grip Strength 25 kg   Flexibility 7.5 in   Single Leg Stand 1.64 seconds       Nutrition Therapy Plan and Nutrition Goals:     Nutrition Therapy & Goals -  12/09/16 1446      Nutrition Therapy  Diet Therapeutic Lifestyle Changes   Drug/Food Interactions Coumadin/Vit K     Personal Nutrition Goals   Nutrition Goal Maintain wt around 135 lb at graduation from Cardiac Rehab   Personal Goal #2 Pt to identify and limit food sources of sodium     Intervention Plan   Intervention Prescribe, educate and counsel regarding individualized specific dietary modifications aiming towards targeted core components such as weight, hypertension, lipid management, diabetes, heart failure and other comorbidities.;Nutrition handout(s) given to patient.  Low Sodium Nutrition Therapy   Expected Outcomes Short Term Goal: Understand basic principles of dietary content, such as calories, fat, sodium, cholesterol and nutrients.;Long Term Goal: Adherence to prescribed nutrition plan.      Nutrition Discharge: Nutrition Scores:     Nutrition Assessments - 12/09/16 1445      MEDFICTS Scores   Pre Score 18      Nutrition Goals Re-Evaluation:   Nutrition Goals Re-Evaluation:   Nutrition Goals Discharge (Final Nutrition Goals Re-Evaluation):   Psychosocial: Target Goals: Acknowledge presence or absence of significant depression and/or stress, maximize coping skills, provide positive support system. Participant is able to verbalize types and ability to use techniques and skills needed for reducing stress and depression.  Initial Review & Psychosocial Screening:     Initial Psych Review & Screening - 11/19/16 [place]? Yes     Barriers   Psychosocial barriers to participate in program There are no identifiable barriers or psychosocial needs.     Screening Interventions   Interventions Encouraged to exercise      Quality of Life Scores:     Quality of Life - 11/27/16 1447      Quality of Life Scores   Health/Function Pre 17.57 %   Socioeconomic Pre 25.5 %   Psych/Spiritual Pre 24 %   Family Pre 24 %    GLOBAL Pre 27.71 %  QOL scores somewhat altered due to physical deconditioning, worries and concerns r/t recent cardiac event. pt has 6 grandchildren and 2 children that live all over the country.        PHQ-9: Recent Review Flowsheet Data    Depression screen [facility] 2/9 11/25/2016 10/20/2016 04/09/2016 01/07/2012 11/26/2011   Decreased Interest 0 0 0 0 0   Down, Depressed, Hopeless 1 0 0 0 0   PHQ - 2 Score 1 0 0 0 0     Interpretation of Total Score  Total Score Depression Severity:  1-4 = Minimal depression, 5-9 = Mild depression, 10-14 = Moderate depression, 15-19 = Moderately severe depression, 20-27 = Severe depression   Psychosocial Evaluation and Intervention:     Psychosocial Evaluation - 12/09/16 1525      Psychosocial Evaluation & Interventions   Comments --      Psychosocial Re-Evaluation:     Psychosocial Re-Evaluation    Row Name 12/08/16 0722             Psychosocial Re-Evaluation   Current issues with Current Stress Concerns;Current Anxiety/Panic       Comments pt c/o stress and anxiety from recent cardiac event. pt is currently walking within the house for exercise, able to navigate some steps.  pt feels strength/stamina are improving gradually.  pt exhibits positive spirit with good coping skills and outlook.         Expected Outcomes pt will continue to exhibit positve outlook with good coping skills.        Interventions Stress management  education;Encouraged to attend Cardiac Rehabilitation for the exercise;Relaxation education       Continue Psychosocial Services  Follow up required by staff       Comments pt demonstrates significant deconditioning from recent cardiac event.          Initial Review   Source of Stress Concerns Chronic Illness          Psychosocial Discharge (Final Psychosocial Re-Evaluation):     Psychosocial Re-Evaluation - 12/08/16 0263      Psychosocial Re-Evaluation   Current issues with Current Stress Concerns;Current  Anxiety/Panic   Comments pt c/o stress and anxiety from recent cardiac event. pt is currently walking within the house for exercise, able to navigate some steps.  pt feels strength/stamina are improving gradually.  pt exhibits positive spirit with good coping skills and outlook.     Expected Outcomes pt will continue to exhibit positve outlook with good coping skills.    Interventions Stress management education;Encouraged to attend Cardiac Rehabilitation for the exercise;Relaxation education   Continue Psychosocial Services  Follow up required by staff   Comments pt demonstrates significant deconditioning from recent cardiac event.      Initial Review   Source of Stress Concerns Chronic Illness      Vocational Rehabilitation: Provide vocational rehab assistance to qualifying candidates.   Vocational Rehab Evaluation & Intervention:     Vocational Rehab - 11/19/16 1649      Initial Vocational Rehab Evaluation & Intervention   Assessment shows need for Vocational Rehabilitation No      Education: Education Goals: Education classes will be provided on a weekly basis, covering required topics. Participant will state understanding/return demonstration of topics presented.  Learning Barriers/Preferences:     Learning Barriers/Preferences - 11/19/16 0941      Learning Barriers/Preferences   Learning Barriers Sight   Learning Preferences Written Material;Skilled Demonstration      Education Topics: Count Your Pulse:  -Group instruction provided by verbal instruction, demonstration, patient participation and written materials to support subject.  Instructors address importance of being able to find your pulse and how to count your pulse when at home without a heart monitor.  Patients get hands on experience counting their pulse with staff help and individually.   Heart Attack, Angina, and Risk Factor Modification:  -Group instruction provided by verbal instruction, video, and  written materials to support subject.  Instructors address signs and symptoms of angina and heart attacks.    Also discuss risk factors for heart disease and how to make changes to improve heart health risk factors.   Functional Fitness:  -Group instruction provided by verbal instruction, demonstration, patient participation, and written materials to support subject.  Instructors address safety measures for doing things around the house.  Discuss how to get up and down off the floor, how to pick things up properly, how to safely get out of a chair without assistance, and balance training.   Meditation and Mindfulness:  -Group instruction provided by verbal instruction, patient participation, and written materials to support subject.  Instructor addresses importance of mindfulness and meditation practice to help reduce stress and improve awareness.  Instructor also leads participants through a meditation exercise.    Stretching for Flexibility and Mobility:  -Group instruction provided by verbal instruction, patient participation, and written materials to support subject.  Instructors lead participants through series of stretches that are designed to increase flexibility thus improving mobility.  These stretches are additional exercise for major muscle groups that are typically performed  during regular warm up and cool down.   Hands Only CPR Anytime:  -Group instruction provided by verbal instruction, video, patient participation and written materials to support subject.  Instructors co-teach with AHA video for hands only CPR.  Participants get hands on experience with mannequins.   Nutrition I class: Heart Healthy Eating:  -Group instruction provided by PowerPoint slides, verbal discussion, and written materials to support subject matter. The instructor gives an explanation and review of the Therapeutic Lifestyle Changes diet recommendations, which includes a discussion on lipid goals, dietary  fat, sodium, fiber, plant stanol/sterol esters, sugar, and the components of a well-balanced, healthy diet.   CARDIAC REHAB PHASE II EXERCISE from 12/11/2016 in [place]  Date  12/09/16  Educator  RD  Instruction Review Code  Not applicable [class handouts given]      Nutrition II class: Lifestyle Skills:  -Group instruction provided by PowerPoint slides, verbal discussion, and written materials to support subject matter. The instructor gives an explanation and review of label reading, grocery shopping for heart health, heart healthy recipe modifications, and ways to make healthier choices when eating out.   CARDIAC REHAB PHASE II EXERCISE from 12/11/2016 in [place]  Date  12/09/16  Educator  RD  Instruction Review Code  Not applicable [class handouts given]      Diabetes Question & Answer:  -Group instruction provided by PowerPoint slides, verbal discussion, and written materials to support subject matter. The instructor gives an explanation and review of diabetes co-morbidities, pre- and post-prandial blood glucose goals, pre-exercise blood glucose goals, signs, symptoms, and treatment of hypoglycemia and hyperglycemia, and foot care basics.   Diabetes Blitz:  -Group instruction provided by PowerPoint slides, verbal discussion, and written materials to support subject matter. The instructor gives an explanation and review of the physiology behind type 1 and type 2 diabetes, diabetes medications and rational behind using different medications, pre- and post-prandial blood glucose recommendations and Hemoglobin A1c goals, diabetes diet, and exercise including blood glucose guidelines for exercising safely.    Portion Distortion:  -Group instruction provided by PowerPoint slides, verbal discussion, written materials, and food models to support subject matter. The instructor gives an explanation of serving size versus portion  size, changes in portions sizes over the last 20 years, and what consists of a serving from each food group.   Stress Management:  -Group instruction provided by verbal instruction, video, and written materials to support subject matter.  Instructors review role of stress in heart disease and how to cope with stress positively.     Exercising on Your Own:  -Group instruction provided by verbal instruction, power point, and written materials to support subject.  Instructors discuss benefits of exercise, components of exercise, frequency and intensity of exercise, and end points for exercise.  Also discuss use of nitroglycerin and activating EMS.  Review options of places to exercise outside of rehab.  Review guidelines for sex with heart disease.   Cardiac Drugs I:  -Group instruction provided by verbal instruction and written materials to support subject.  Instructor reviews cardiac drug classes: antiplatelets, anticoagulants, beta blockers, and statins.  Instructor discusses reasons, side effects, and lifestyle considerations for each drug class.   Cardiac Drugs II:  -Group instruction provided by verbal instruction and written materials to support subject.  Instructor reviews cardiac drug classes: angiotensin converting enzyme inhibitors (ACE-I), angiotensin II receptor blockers (ARBs), nitrates, and calcium channel blockers.  Instructor discusses reasons, side effects,  and lifestyle considerations for each drug class.   CARDIAC REHAB PHASE II EXERCISE from 12/11/2016 in [place]  Date  12/02/16  Educator  pharmacist  Instruction Review Code  2- meets goals/outcomes      Anatomy and Physiology of the Circulatory System:  -Group instruction provided by verbal instruction, video, and written materials to support subject.  Reviews functional anatomy of heart, how it relates to various diagnoses, and what role the heart plays in the overall  system.   Knowledge Questionnaire Score:     Knowledge Questionnaire Score - 11/19/16 1218      Knowledge Questionnaire Score   Pre Score 17/24      Core Components/Risk Factors/Patient Goals at Admission:     Personal Goals and Risk Factors at Admission - 11/19/16 1242      Core Components/Risk Factors/Patient Goals on Admission   Heart Failure Yes   Intervention Provide a combined exercise and nutrition program that is supplemented with education, support and counseling about heart failure. Directed toward relieving symptoms such as shortness of breath, decreased exercise tolerance, and extremity edema.   Expected Outcomes Improve functional capacity of life;Short term: Attendance in program 2-3 days a week with increased exercise capacity. Reported lower sodium intake. Reported increased fruit and vegetable intake. Reports medication compliance.;Short term: Daily weights obtained and reported for increase. Utilizing diuretic protocols set by physician.;Long term: Adoption of self-care skills and reduction of barriers for early signs and symptoms recognition and intervention leading to self-care maintenance.   Hypertension Yes   Intervention Provide education on lifestyle modifcations including regular physical activity/exercise, weight management, moderate sodium restriction and increased consumption of fresh fruit, vegetables, and low fat dairy, alcohol moderation, and smoking cessation.;Monitor prescription use compliance.   Expected Outcomes Short Term: Continued assessment and intervention until BP is < 140/44mm HG in hypertensive participants. < 130/32mm HG in hypertensive participants with diabetes, heart failure or chronic kidney disease.;Long Term: Maintenance of blood pressure at goal levels.   Lipids Yes   Intervention Provide education and support for participant on nutrition & aerobic/resistive exercise along with prescribed medications to achieve LDL 70mg , HDL >40mg .    Expected Outcomes Short Term: Participant states understanding of desired cholesterol values and is compliant with medications prescribed. Participant is following exercise prescription and nutrition guidelines.;Long Term: Cholesterol controlled with medications as prescribed, with individualized exercise RX and with personalized nutrition plan. Value goals: LDL < 70mg , HDL > 40 mg.      Core Components/Risk Factors/Patient Goals Review:      Goals and Risk Factor Review    Row Name 12/16/16 1201             Core Components/Risk Factors/Patient Goals Review   Personal Goals Review Heart Failure;Hypertension;Lipids;Other       Review pt is looking forward to being able to perform ADLs independantly and return to driving.  pt has noticed subtle differences in her functional ability since CR participation.         Expected Outcomes Provide functional mobilty education and exercise programming to assist with increasing strength and stamina in order for patient to regain independence.          Core Components/Risk Factors/Patient Goals at Discharge (Final Review):      Goals and Risk Factor Review - 12/16/16 1201      Core Components/Risk Factors/Patient Goals Review   Personal Goals Review Heart Failure;Hypertension;Lipids;Other   Review pt is looking forward to being able to perform  ADLs independantly and return to driving.  pt has noticed subtle differences in her functional ability since CR participation.     Expected Outcomes Provide functional mobilty education and exercise programming to assist with increasing strength and stamina in order for patient to regain independence.      ITP Comments:     ITP Comments    Row Name 11/19/16 [phone]           ITP Comments Dr.  Loreta Ave, Medical Director          Comments: Pt is making expected progress toward personal goals after completing 7 sessions. Recommend continued exercise and life style modification education including   stress management and relaxation techniques to decrease cardiac risk profile.

## 2016-12-17 ENCOUNTER — Encounter: Payer: Self-pay | Admitting: Hematology and Oncology

## 2016-12-17 ENCOUNTER — Other Ambulatory Visit (HOSPITAL_BASED_OUTPATIENT_CLINIC_OR_DEPARTMENT_OTHER): Payer: Medicare Other

## 2016-12-17 ENCOUNTER — Ambulatory Visit (HOSPITAL_BASED_OUTPATIENT_CLINIC_OR_DEPARTMENT_OTHER): Payer: Medicare Other | Admitting: Hematology and Oncology

## 2016-12-17 DIAGNOSIS — D696 Thrombocytopenia, unspecified: Secondary | ICD-10-CM

## 2016-12-17 DIAGNOSIS — D649 Anemia, unspecified: Secondary | ICD-10-CM

## 2016-12-17 LAB — CBC WITH DIFFERENTIAL/PLATELET
BASO%: 1.4 % (ref 0.0–2.0)
BASOS ABS: 0.1 10*3/uL (ref 0.0–0.1)
EOS ABS: 0.1 10*3/uL (ref 0.0–0.5)
EOS%: 2 % (ref 0.0–7.0)
HEMATOCRIT: 29.2 % — AB (ref 34.8–46.6)
HGB: 9.6 g/dL — ABNORMAL LOW (ref 11.6–15.9)
LYMPH%: 16.3 % (ref 14.0–49.7)
MCH: 27.9 pg (ref 25.1–34.0)
MCHC: 32.9 g/dL (ref 31.5–36.0)
MCV: 84.9 fL (ref 79.5–101.0)
MONO#: 0.7 10*3/uL (ref 0.1–0.9)
MONO%: 14.5 % — ABNORMAL HIGH (ref 0.0–14.0)
NEUT%: 65.8 % (ref 38.4–76.8)
NEUTROS ABS: 3.3 10*3/uL (ref 1.5–6.5)
PLATELETS: 158 10*3/uL (ref 145–400)
RBC: 3.44 10*6/uL — ABNORMAL LOW (ref 3.70–5.45)
RDW: 28.8 % — AB (ref 11.2–14.5)
WBC: 5 10*3/uL (ref 3.9–10.3)
lymph#: 0.8 10*3/uL — ABNORMAL LOW (ref 0.9–3.3)
nRBC: 1 % — ABNORMAL HIGH (ref 0–0)

## 2016-12-17 LAB — TECHNOLOGIST REVIEW

## 2016-12-17 NOTE — Progress Notes (Signed)
Patient Care Team: Lucianne Lei, MD as PCP - General (Family Medicine) Loletta Specter, RN as [place] Management  DIAGNOSIS:  Encounter Diagnosis  Name Primary?  . Thrombocytopenia ([place])    CHIEF COMPLIANT: Follow-up of thrombocytopenia, anemia  INTERVAL HISTORY: Carrie [name] is a 76 year old with above-mentioned history of anemia and thrombocytopenia. She was recently in the hospital with the non-ST elevation MI. She had a stent placement. She is currently undergoing cardiac rehabilitation. She complains of profound fatigue and tiredness.  REVIEW OF SYSTEMS:   Constitutional: Denies fevers, chills or abnormal weight loss Eyes: Denies blurriness of vision Ears, nose, mouth, throat, and face: Denies mucositis or sore throat Respiratory: Denies cough, dyspnea or wheezes Cardiovascular: Denies palpitation, chest discomfort Gastrointestinal:  Denies nausea, heartburn or change in bowel habits Skin: Denies abnormal skin rashes Lymphatics: Denies new lymphadenopathy or easy bruising Neurological:Denies numbness, tingling or new weaknesses Behavioral/Psych: Mood is stable, no new changes  Extremities: No lower extremity edema  All other systems were reviewed with the patient and are negative.  I have reviewed the past medical history, past surgical history, social history and family history with the patient and they are unchanged from previous note.  ALLERGIES:  is allergic to codeine.  MEDICATIONS:  Current Outpatient Prescriptions  Medication Sig Dispense Refill  . atorvastatin (LIPITOR) 40 MG tablet Take 1 tablet (40 mg total) by mouth daily at 6 PM. (Patient taking differently: Take 40 mg by mouth daily. ) 30 tablet 12  . Biotin 1000 MCG tablet Take 1,000 mcg by mouth daily.    . clopidogrel (PLAVIX) 75 MG tablet Take 1 tablet (75 mg total) by mouth daily with breakfast. 30 tablet 12  . diltiazem (CARTIA XT) 120 MG 24 hr capsule Take 1 capsule (120  mg total) by mouth daily. 90 capsule 3  . docusate sodium (COLACE) 100 MG capsule Take 200 mg by mouth 2 (two) times daily.     . furosemide (LASIX) 40 MG tablet Take 1 tablet (40 mg total) by mouth daily. 90 tablet 3  . metoprolol tartrate (LOPRESSOR) 25 MG tablet Take 1 tablet (25 mg total) by mouth 2 (two) times daily. 180 tablet 3  . Multiple Vitamin (MULTIVITAMINS PO) Take 1 tablet by mouth daily.     . nitroGLYCERIN (NITROSTAT) 0.4 MG SL tablet Place 1 tablet (0.4 mg total) under the tongue every 5 (five) minutes as needed for chest pain. 225 tablet 0  . Omega-3 Fatty Acids (FISH OIL) 1000 MG CAPS Take 1,000 mg by mouth daily.    . polyethylene glycol (MIRALAX / GLYCOLAX) packet Take 17 g by mouth as needed (for constipation). Reported on 03/25/2016    . potassium chloride SA (K-DUR,KLOR-CON) 20 MEQ tablet Take 2 tablets (40 mEq total) by mouth daily. (Patient taking differently: Take 20 mEq by mouth daily. ) 180 tablet 3  . warfarin (COUMADIN) 5 MG tablet TAKE 1 TABLET BY MOUTH DAILY AS DIRECTED (Patient taking differently: TAKE 1 TABLET BY MOUTH DAILY AS DIRECTED take one and a half on mondays and fridays) 90 tablet 1   No current facility-administered medications for this visit.     PHYSICAL EXAMINATION: ECOG PERFORMANCE STATUS: 2 - Symptomatic, <50% confined to bed  Vitals:   12/17/16 1115  BP: 125/68  Pulse: 84  Resp: 17  Temp: 97.7 F (36.5 C)   Filed Weights   12/17/16 1115  Weight: 139 lb 3.2 oz (63.1 kg)    GENERAL:alert, no distress  and comfortable SKIN: skin color, texture, turgor are normal, no rashes or significant lesions EYES: normal, Conjunctiva are pink and non-injected, sclera clear OROPHARYNX:no exudate, no erythema and lips, buccal mucosa, and tongue normal  NECK: supple, thyroid normal size, non-tender, without nodularity LYMPH:  no palpable lymphadenopathy in the cervical, axillary or inguinal LUNGS: clear to auscultation and percussion with normal  breathing effort HEART: Irregular ABDOMEN:abdomen soft, non-tender and normal bowel sounds MUSCULOSKELETAL:no cyanosis of digits and no clubbing  NEURO: alert & oriented x 3 with fluent speech, no focal motor/sensory deficits EXTREMITIES: No lower extremity edema  LABORATORY DATA:  I have reviewed the data as listed   Chemistry      Component Value Date/Time   NA 132 (L) 10/27/2016 1546   NA 139 12/03/2014 0959   K 5.0 10/27/2016 1546   K 3.9 12/03/2014 0959   CL 102 10/27/2016 1546   CL 100 03/14/2010 1318   CO2 20 10/27/2016 1546   CO2 26 12/03/2014 0959   BUN 21 10/27/2016 1546   BUN 18.7 12/03/2014 0959   CREATININE 0.95 (H) 10/27/2016 1546   CREATININE 0.9 12/03/2014 0959      Component Value Date/Time   CALCIUM 9.4 10/27/2016 1546   CALCIUM 10.5 (H) 12/03/2014 0959   ALKPHOS 116 03/21/2016 0325   ALKPHOS 106 12/03/2014 0959   AST 106 (H) 03/21/2016 0325   AST 58 (H) 12/03/2014 0959   ALT 28 03/21/2016 0325   ALT 24 12/03/2014 0959   BILITOT 0.7 03/21/2016 0325   BILITOT 0.50 12/03/2014 0959       Lab Results  Component Value Date   WBC 5.0 12/17/2016   HGB 9.6 (L) 12/17/2016   HCT 29.2 (L) 12/17/2016   MCV 84.9 12/17/2016   PLT 158 12/17/2016   NEUTROABS 3.3 12/17/2016    ASSESSMENT & PLAN:  Thrombocytopenia Thrombocytopenia has resolved January 2018 patient was in the hospital with NSTEMI During that time her platelet counts were normal  She was profoundly anemic during the hospitalization. Anemia was normocytic in nature. This is anemia of chronic disease. Her hemoglobin baseline is around 10 g. Today her hemoglobin is 9.6 g. I did not recommend any further interventions and biopsies. We discussed the role of bone marrow biopsy and elected not to pursue it.  Return to clinic on an as-needed basis.  No orders of the defined types were placed in this encounter.  The patient has a good understanding of the overall plan. she agrees with it. she  will call with any problems that may develop before the next visit here.   Rulon Eisenmenger, MD 12/17/16

## 2016-12-17 NOTE — Progress Notes (Signed)
Reviewed home exercise with pt today.  Pt plans to do hip strengthening/balance h/o for exercise, 2x/week in addition to coming to cardiac rehab.Reviewed THR, pulse, RPE, sign and symptoms, NTG use, and when to call 911 or MD.  Also discussed weather considerations and indoor options.  Pt voiced understanding.    Carrie [name] [employer]

## 2016-12-17 NOTE — Assessment & Plan Note (Signed)
Thrombocytopenia has resolved January 2018 patient was in the hospital with NSTEMI During that time her platelet counts were normal  She was profoundly anemic during the hospitalization. Anemia was normocytic in nature. Possibly related to stress/ blood loss.

## 2016-12-18 ENCOUNTER — Encounter (HOSPITAL_COMMUNITY)
Admission: RE | Admit: 2016-12-18 | Discharge: 2016-12-18 | Disposition: A | Discharge status: Home / Self Care | Payer: Medicare Other | Source: Ambulatory Visit | Attending: Cardiovascular Disease | Admitting: Cardiovascular Disease

## 2016-12-18 DIAGNOSIS — I214 Non-ST elevation (NSTEMI) myocardial infarction: Secondary | ICD-10-CM | POA: Diagnosis not present

## 2016-12-18 DIAGNOSIS — Z955 Presence of coronary angioplasty implant and graft: Secondary | ICD-10-CM | POA: Diagnosis not present

## 2016-12-21 ENCOUNTER — Other Ambulatory Visit: Payer: Self-pay

## 2016-12-21 ENCOUNTER — Ambulatory Visit (INDEPENDENT_AMBULATORY_CARE_PROVIDER_SITE_OTHER): Payer: Medicare Other | Admitting: Pharmacist Clinician (PhC)/ Clinical Pharmacy Specialist

## 2016-12-21 DIAGNOSIS — Z952 Presence of prosthetic heart valve: Secondary | ICD-10-CM

## 2016-12-21 DIAGNOSIS — Z7901 Long term (current) use of anticoagulants: Secondary | ICD-10-CM

## 2016-12-21 DIAGNOSIS — I482 Chronic atrial fibrillation, unspecified: Secondary | ICD-10-CM

## 2016-12-21 LAB — POCT INR: INR: 2.2

## 2016-12-21 NOTE — Patient Outreach (Signed)
[facility]) Care Management  12/21/16  Carrie [name] 11/14/1940 [id number]  Attempted to reach patient without success. Phone rang multiple times with no answer and no option to leave a voicemail.   Will attempt to reach patient again later this week.  [place] R. Maddux First, RN, BSN, [place] Management Coordinator [phone]

## 2016-12-23 ENCOUNTER — Encounter (HOSPITAL_COMMUNITY)
Admission: RE | Admit: 2016-12-23 | Discharge: 2016-12-23 | Disposition: A | Discharge status: Home / Self Care | Payer: Medicare Other | Source: Ambulatory Visit | Attending: Cardiovascular Disease | Admitting: Cardiovascular Disease

## 2016-12-23 ENCOUNTER — Telehealth: Payer: Self-pay

## 2016-12-23 DIAGNOSIS — Z955 Presence of coronary angioplasty implant and graft: Secondary | ICD-10-CM | POA: Diagnosis not present

## 2016-12-23 DIAGNOSIS — I214 Non-ST elevation (NSTEMI) myocardial infarction: Secondary | ICD-10-CM | POA: Diagnosis not present

## 2016-12-23 NOTE — Telephone Encounter (Signed)
Faxed signed plan of care to [facility].

## 2016-12-24 ENCOUNTER — Telehealth: Payer: Self-pay | Admitting: Cardiovascular Disease

## 2016-12-24 ENCOUNTER — Ambulatory Visit: Payer: Self-pay

## 2016-12-24 NOTE — Telephone Encounter (Signed)
New Message  Carrie [name] voiced she is calling to get pts last bp readings.  Fax# [phone] if you'd like to fax.  Please f/u

## 2016-12-24 NOTE — Telephone Encounter (Signed)
Returned call, lmtcb. 

## 2016-12-25 ENCOUNTER — Encounter (HOSPITAL_COMMUNITY)
Admission: RE | Admit: 2016-12-25 | Discharge: 2016-12-25 | Disposition: A | Discharge status: Home / Self Care | Payer: Medicare Other | Source: Ambulatory Visit | Attending: Cardiovascular Disease | Admitting: Cardiovascular Disease

## 2016-12-25 DIAGNOSIS — Z955 Presence of coronary angioplasty implant and graft: Secondary | ICD-10-CM

## 2016-12-25 DIAGNOSIS — I214 Non-ST elevation (NSTEMI) myocardial infarction: Secondary | ICD-10-CM | POA: Diagnosis not present

## 2016-12-25 NOTE — Telephone Encounter (Signed)
Returning Haley's call from yesterday.

## 2016-12-25 NOTE — Telephone Encounter (Signed)
If she does not answer,please just leave the information on her voice mail.

## 2016-12-25 NOTE — Telephone Encounter (Signed)
Returned call to Brooke Bonito RN with [facility].Left 11/18/16 B/P 121/76 pulse 76 on personal voice mail.

## 2016-12-30 ENCOUNTER — Encounter (HOSPITAL_COMMUNITY)
Admission: RE | Admit: 2016-12-30 | Discharge: 2016-12-30 | Disposition: A | Discharge status: Home / Self Care | Payer: Medicare Other | Source: Ambulatory Visit | Attending: Cardiovascular Disease | Admitting: Cardiovascular Disease

## 2016-12-30 DIAGNOSIS — Z955 Presence of coronary angioplasty implant and graft: Secondary | ICD-10-CM | POA: Diagnosis not present

## 2016-12-30 DIAGNOSIS — I214 Non-ST elevation (NSTEMI) myocardial infarction: Secondary | ICD-10-CM | POA: Diagnosis not present

## 2017-01-01 ENCOUNTER — Encounter (HOSPITAL_COMMUNITY)
Admission: RE | Admit: 2017-01-01 | Discharge: 2017-01-01 | Disposition: A | Discharge status: Home / Self Care | Payer: Medicare Other | Source: Ambulatory Visit | Attending: Cardiovascular Disease | Admitting: Cardiovascular Disease

## 2017-01-01 DIAGNOSIS — I214 Non-ST elevation (NSTEMI) myocardial infarction: Secondary | ICD-10-CM

## 2017-01-01 DIAGNOSIS — Z955 Presence of coronary angioplasty implant and graft: Secondary | ICD-10-CM | POA: Diagnosis not present

## 2017-01-06 ENCOUNTER — Encounter (HOSPITAL_COMMUNITY)
Admission: RE | Admit: 2017-01-06 | Discharge: 2017-01-06 | Disposition: A | Discharge status: Home / Self Care | Payer: Medicare Other | Source: Ambulatory Visit | Attending: Cardiovascular Disease | Admitting: Cardiovascular Disease

## 2017-01-06 DIAGNOSIS — I214 Non-ST elevation (NSTEMI) myocardial infarction: Secondary | ICD-10-CM | POA: Diagnosis not present

## 2017-01-06 DIAGNOSIS — Z955 Presence of coronary angioplasty implant and graft: Secondary | ICD-10-CM | POA: Diagnosis not present

## 2017-01-08 ENCOUNTER — Encounter (HOSPITAL_COMMUNITY)
Admission: RE | Admit: 2017-01-08 | Discharge: 2017-01-08 | Disposition: A | Discharge status: Home / Self Care | Payer: Medicare Other | Source: Ambulatory Visit | Attending: Cardiovascular Disease | Admitting: Cardiovascular Disease

## 2017-01-08 ENCOUNTER — Other Ambulatory Visit: Payer: Self-pay

## 2017-01-08 DIAGNOSIS — I214 Non-ST elevation (NSTEMI) myocardial infarction: Secondary | ICD-10-CM | POA: Diagnosis not present

## 2017-01-08 DIAGNOSIS — Z955 Presence of coronary angioplasty implant and graft: Secondary | ICD-10-CM | POA: Diagnosis not present

## 2017-01-08 NOTE — Patient Outreach (Signed)
[facility]) Care Management  01/08/17  Carrie [name] 15-Oct-1940 [id number]  Successful outreach completed with patient, identity verified.  Patient stated that she is feeling fine. She currently denies any chest pain or shortness of breath. She stated that she does still continue to experience shortness of breath with exertion.   Patient reported that she is still checking her blood sugar and daily weights, but has not yet done so this morning. Yesterday's weight was 134 and her BP was 109/71.  She reported that she still has her scales from [facility] and that they are not working. She has been in contact with the nurse from [facility].   Patient stated that cardiac rehabilitation is "going good."   Patient currently has no concerns or needs.  Plan: Will follow up within next month. If she continues to do well with no needs, will complete a closure call or visit.   [place] R. Vincenzina Jagoda, RN, BSN, [place] Management Coordinator [phone]

## 2017-01-12 DIAGNOSIS — H10413 Chronic giant papillary conjunctivitis, bilateral: Secondary | ICD-10-CM | POA: Diagnosis not present

## 2017-01-12 DIAGNOSIS — Z961 Presence of intraocular lens: Secondary | ICD-10-CM | POA: Diagnosis not present

## 2017-01-12 DIAGNOSIS — H04123 Dry eye syndrome of bilateral lacrimal glands: Secondary | ICD-10-CM | POA: Diagnosis not present

## 2017-01-13 ENCOUNTER — Encounter (HOSPITAL_COMMUNITY)
Admission: RE | Admit: 2017-01-13 | Discharge: 2017-01-13 | Disposition: A | Discharge status: Home / Self Care | Payer: Medicare Other | Source: Ambulatory Visit | Attending: Cardiovascular Disease | Admitting: Cardiovascular Disease

## 2017-01-13 DIAGNOSIS — I214 Non-ST elevation (NSTEMI) myocardial infarction: Secondary | ICD-10-CM | POA: Diagnosis not present

## 2017-01-13 DIAGNOSIS — Z955 Presence of coronary angioplasty implant and graft: Secondary | ICD-10-CM

## 2017-01-13 NOTE — Progress Notes (Signed)
Cardiac Individual Treatment Plan  Patient Details  Name: Carrie [name] MRN: [id number] Date of Birth: Mar 17, 1941 Referring Provider:     CARDIAC REHAB PHASE II ORIENTATION from 11/19/2016 in [place]  Referring Provider  Croitoru, Mihai MD      Initial Encounter Date:    [place] from 11/19/2016 in [place]  Date  11/19/16  Referring Provider  Croitoru, Mihai MD      Visit Diagnosis: 09/29/16 NSTEMI (non-ST elevated myocardial infarction) ([place])  09/30/16 Status post coronary artery stent placement  Patient's Home Medications on Admission:  Current Outpatient Prescriptions:  .  atorvastatin (LIPITOR) 40 MG tablet, Take 1 tablet (40 mg total) by mouth daily at 6 PM. (Patient taking differently: Take 40 mg by mouth daily. ), Disp: 30 tablet, Rfl: 12 .  Biotin 1000 MCG tablet, Take 1,000 mcg by mouth daily., Disp: , Rfl:  .  clopidogrel (PLAVIX) 75 MG tablet, Take 1 tablet (75 mg total) by mouth daily with breakfast., Disp: 30 tablet, Rfl: 12 .  diltiazem (CARTIA XT) 120 MG 24 hr capsule, Take 1 capsule (120 mg total) by mouth daily., Disp: 90 capsule, Rfl: 3 .  docusate sodium (COLACE) 100 MG capsule, Take 200 mg by mouth 2 (two) times daily. , Disp: , Rfl:  .  furosemide (LASIX) 40 MG tablet, Take 1 tablet (40 mg total) by mouth daily., Disp: 90 tablet, Rfl: 3 .  metoprolol tartrate (LOPRESSOR) 25 MG tablet, Take 1 tablet (25 mg total) by mouth 2 (two) times daily., Disp: 180 tablet, Rfl: 3 .  Multiple Vitamin (MULTIVITAMINS PO), Take 1 tablet by mouth daily. , Disp: , Rfl:  .  nitroGLYCERIN (NITROSTAT) 0.4 MG SL tablet, Place 1 tablet (0.4 mg total) under the tongue every 5 (five) minutes as needed for chest pain., Disp: 225 tablet, Rfl: 0 .  Omega-3 Fatty Acids (FISH OIL) 1000 MG CAPS, Take 1,000 mg by mouth daily., Disp: , Rfl:  .  polyethylene glycol (MIRALAX / GLYCOLAX) packet, Take 17 g by  mouth as needed (for constipation). Reported on 03/25/2016, Disp: , Rfl:  .  potassium chloride SA (K-DUR,KLOR-CON) 20 MEQ tablet, Take 2 tablets (40 mEq total) by mouth daily. (Patient taking differently: Take 20 mEq by mouth daily. ), Disp: 180 tablet, Rfl: 3 .  warfarin (COUMADIN) 5 MG tablet, TAKE 1 TABLET BY MOUTH DAILY AS DIRECTED (Patient taking differently: TAKE 1 TABLET BY MOUTH DAILY AS DIRECTED take one and a half on mondays and fridays), Disp: 90 tablet, Rfl: 1  Past Medical History: Past Medical History:  Diagnosis Date  . Anticoagulated on Coumadin    for mech valve and atrial fib  goal 2.0-2.5  . Anxiety   . Arthritis    "right shoulder" (03/15/2013)  . Atrial fibrillation ([place])   . Atrial flutter ([place])   . CAD (coronary artery disease) 08/11/2016  . CHF (congestive heart failure) ([place])   . Chronic combined systolic and diastolic CHF (congestive heart failure) ([place])    a. 02/2016 Echo: EF 45-50%.  . Diverticula of colon   . Exertional shortness of breath   . GERD (gastroesophageal reflux disease)   . Heart murmur   . Hemorrhage intraabdominal 03/17/2012  . History of blood transfusion    "once" (03/15/2013)  . Hyperlipidemia   . Hypertension   . Liver hemorrhage   . Migraines   . 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  Bi-leaflet St[employer] Jude mechanical prosthesis; b. 02/2016 Echo: EF 45-50%, some degree of MR, sev dil LA/RA, sev TR.  . NSTEMI (non-ST elevated myocardial infarction) ([place])    a. 02/2016 elev trop/Cath: nonobs dzs,   . Pacemaker    medtronic adapta  . Pneumonia 2009   resolved.? OPD Rx  . Severe tricuspid regurgitation    a. 02/2016 Echo: Ef 45-50%, sev TR, PASP 41mmHg.  . Sick sinus syndrome ([place])    Dr Cristopher Peru. EP study negative. Pacemaker 12/15/06 Medtronic  . Stroke [facility])    "they say I had a stroke last year" denies residual on 03/15/2013  . Subdural hematoma ([facility])     Tobacco Use: History  Smoking Status  . Never Smoker   Smokeless Tobacco  . Never Used    Labs: Recent Review Flowsheet Data    Labs for ITP Cardiac and Pulmonary Rehab Latest Ref Rng & Units 09/06/2011 03/15/2013 03/12/2016   Cholestrol 0 - 200 mg/dL - - 144   LDLCALC 0 - 99 mg/dL - - 88   HDL >40 mg/dL - - 49   Trlycerides <150 mg/dL - - 35   Hemoglobin A1c <5.7 % - 5.5 -   PHART 7.350 - 7.400 7.463(H) - -   PCO2ART 35.0 - 45.0 mmHg 31.0(L) - -   HCO3 20.0 - 24.0 mEq/L 21.4 - -   TCO2 0 - 100 mmol/L 18.8 - -   ACIDBASEDEF 0.0 - 2.0 mmol/L 0.4 - -   O2SAT % 94.7 - -      Capillary Blood Glucose: Lab Results  Component Value Date   GLUCAP 136 (H) 03/20/2012   GLUCAP 120 (H) 03/18/2012   GLUCAP 184 (H) 03/16/2012     Exercise Target Goals:    Exercise Program Goal: Individual exercise prescription set with THRR, safety & activity barriers. Participant demonstrates ability to understand and report RPE using BORG scale, to self-measure pulse accurately, and to acknowledge the importance of the exercise prescription.  Exercise Prescription Goal: Starting with aerobic activity 30 plus minutes a day, 3 days per week for initial exercise prescription. Provide home exercise prescription and guidelines that participant acknowledges understanding prior to discharge.  Activity Barriers & Risk Stratification:     Activity Barriers & Cardiac Risk Stratification - 11/19/16 0942      Activity Barriers & Cardiac Risk Stratification   Activity Barriers Arthritis;Deconditioning;Muscular Weakness   Cardiac Risk Stratification High      6 Minute Walk:     6 Minute Walk    Row Name 11/19/16 1227         6 Minute Walk   Phase Initial     Distance 896 feet     Walk Time 6 minutes     # of Rest Breaks 0     MPH 1.69     METS 2.02     RPE 13     VO2 Peak 7.1     Symptoms No     Resting HR 71 bpm     Resting BP 128/64     Max Ex. HR 125 bpm     Max Ex. BP 128/74     2 Minute Post BP 124/80        Oxygen Initial  Assessment:   Oxygen Re-Evaluation:   Oxygen Discharge (Final Oxygen Re-Evaluation):   Initial Exercise Prescription:     Initial Exercise Prescription - 11/19/16 1200      Date of Initial Exercise RX and Referring Provider  Date 11/19/16   Referring Provider Croitoru, Mihai MD     Recumbant Bike   Level 1   Minutes 10   METs 1     NuStep   Level 1   SPM 60   Minutes 10   METs 1     Track   Laps 5   Minutes 10   METs 1.84     Prescription Details   Frequency (times per week) 3   Duration Progress to 30 minutes of continuous aerobic without signs/symptoms of physical distress     Intensity   THRR 40-80% of Max Heartrate 58-117   Ratings of Perceived Exertion 11-13   Perceived Dyspnea 0-4     Progression   Progression Continue to progress workloads to maintain intensity without signs/symptoms of physical distress.     Resistance Training   Training Prescription Yes   Weight 1lb   Reps 10-15      Perform Capillary Blood Glucose checks as needed.  Exercise Prescription Changes:     Exercise Prescription Changes    Row Name 12/02/16 1500 12/15/16 1500 12/30/16 1600 01/12/17 1500       Response to Exercise   Blood Pressure (Admit) 128/70 122/82 122/78 112/70    Blood Pressure (Exercise) 126/70 122/78 124/68 120/80    Blood Pressure (Exit) 114/62 126/78 124/70 108/78    Heart Rate (Admit) 80 bpm 76 bpm 76 bpm 72 bpm    Heart Rate (Exercise) 105 bpm 103 bpm 87 bpm 85 bpm    Heart Rate (Exit) 74 bpm 76 bpm 70 bpm 68 bpm    Rating of Perceived Exertion (Exercise) 14 13 11 11     Symptoms none none none none    Duration Continue with 30 min of aerobic exercise without signs/symptoms of physical distress. Continue with 30 min of aerobic exercise without signs/symptoms of physical distress. Continue with 30 min of aerobic exercise without signs/symptoms of physical distress. Continue with 30 min of aerobic exercise without signs/symptoms of physical distress.     Intensity THRR unchanged THRR unchanged THRR unchanged THRR unchanged      Progression   Progression Continue to progress workloads to maintain intensity without signs/symptoms of physical distress. Continue to progress workloads to maintain intensity without signs/symptoms of physical distress. Continue to progress workloads to maintain intensity without signs/symptoms of physical distress. Continue to progress workloads to maintain intensity without signs/symptoms of physical distress.    Average METs  -  - 1.4 1.7      Resistance Training   Training Prescription Yes Yes Yes Yes    Weight 1lb 1lb 1lb 1lb    Reps 10-15 10-15 10-15 10-15    Time 10 Minutes 10 Minutes 10 Minutes 10 Minutes      Recumbant Bike   Level 1 1 3 3     Minutes 15 15 15 15     METs 1 2.24 1.5 2.4      NuStep   Level  - 2 2 2     SPM  - 60 60 60    Minutes  - 8 15 15     METs  - 1.3 1.2 1.1      Track   Laps 8 4  -  -    Minutes 15 7  -  -    METs 1.84 2.09  -  -      Home Exercise Plan   Plans to continue exercise at  -  - -  hip strengthening and balance  exercises -  hip strengthening and balance exercises    Frequency  -  - Add 2 additional days to program exercise sessions. Add 2 additional days to program exercise sessions.    Initial Home Exercises Provided  -  - 12/16/16 12/16/16       Exercise Comments:     Exercise Comments    Row Name 12/09/16 1456 01/12/17 1523         Exercise Comments Reviewed METs and goals. Pt is tolerating exercise fairly well; will continue to monitor exercise progression. Reviewed METs and goals. Pt is tolerating exercise fairly well; will continue to monitor exercise progression.         Exercise Goals and Review:     Exercise Goals    Row Name 11/19/16 0941             Exercise Goals   Increase Physical Activity Yes       Intervention Provide advice, education, support and counseling about physical activity/exercise needs.;Develop an  individualized exercise prescription for aerobic and resistive training based on initial evaluation findings, risk stratification, comorbidities and participant's personal goals.       Expected Outcomes Achievement of increased cardiorespiratory fitness and enhanced flexibility, muscular endurance and strength shown through measurements of functional capacity and personal statement of participant.       Increase Strength and Stamina Yes       Intervention Provide advice, education, support and counseling about physical activity/exercise needs.;Develop an individualized exercise prescription for aerobic and resistive training based on initial evaluation findings, risk stratification, comorbidities and participant's personal goals.       Expected Outcomes Achievement of increased cardiorespiratory fitness and enhanced flexibility, muscular endurance and strength shown through measurements of functional capacity and personal statement of participant.          Exercise Goals Re-Evaluation :     Exercise Goals Re-Evaluation    [place] Name 12/09/16 1452 12/17/16 1422 12/17/16 1424 01/12/17 1522       Exercise Goal Re-Evaluation   Exercise Goals Review Increase Physical Activity;Increase Strenth and Stamina Increase Physical Activity;Increase Strenth and Stamina Increase Physical Activity;Increase Strenth and Stamina Increase Physical Activity;Increase Strenth and Stamina    Comments Pt is progressing towards 30 minutes of continuous exercise. Pt stated "stamina still needs improvement"  Reiv Reviewed home exercise with pt today.  Pt plans to do hip strengthening/balance h/o for exercise, 2x/week in addition to coming to cardiac rehab.Reviewed THR, pulse, RPE, sign and symptoms, NTG use, and when to call 911 or MD.  Also discussed weather considerations and indoor options.  Pt voiced understanding. Pt is tolerating exercise fairly well in cardiac rehab. Pt is exercising for 30 minutes continuous without  symptoms    Expected Outcomes Pt will work towards 30 minutes of continuous exercise and improve in strength and stamina.  - Pt will work towards 30 minutes of continuous exercise and improve in strength and stamina. Pt will continue to exercise for 30 minutes to build on aerobic capacity.        Discharge Exercise Prescription (Final Exercise Prescription Changes):     Exercise Prescription Changes - 01/12/17 1500      Response to Exercise   Blood Pressure (Admit) 112/70   Blood Pressure (Exercise) 120/80   Blood Pressure (Exit) 108/78   Heart Rate (Admit) 72 bpm   Heart Rate (Exercise) 85 bpm   Heart Rate (Exit) 68 bpm   Rating of Perceived Exertion (Exercise) 11   Symptoms none  Duration Continue with 30 min of aerobic exercise without signs/symptoms of physical distress.   Intensity THRR unchanged     Progression   Progression Continue to progress workloads to maintain intensity without signs/symptoms of physical distress.   Average METs 1.7     Resistance Training   Training Prescription Yes   Weight 1lb   Reps 10-15   Time 10 Minutes     Recumbant Bike   Level 3   Minutes 15   METs 2.4     NuStep   Level 2   SPM 60   Minutes 15   METs 1.1     Home Exercise Plan   Plans to continue exercise at --  hip strengthening and balance exercises   Frequency Add 2 additional days to program exercise sessions.   Initial Home Exercises Provided 12/16/16      Nutrition:  Target Goals: Understanding of nutrition guidelines, daily intake of sodium 1500mg , cholesterol 200mg , calories 30% from fat and 7% or less from saturated fats, daily to have 5 or more servings of fruits and vegetables.  Biometrics:     Pre Biometrics - 11/19/16 1236      Pre Biometrics   Waist Circumference 33 inches   Hip Circumference 38 inches   Waist to Hip Ratio 0.87 %   Triceps Skinfold 17 mm   % Body Fat 36.9 %   Grip Strength 25 kg   Flexibility 7.5 in   Single Leg Stand 1.64  seconds       Nutrition Therapy Plan and Nutrition Goals:     Nutrition Therapy & Goals - 12/09/16 1446      Nutrition Therapy   Diet Therapeutic Lifestyle Changes   Drug/Food Interactions Coumadin/Vit K     Personal Nutrition Goals   Nutrition Goal Maintain wt around 135 lb at graduation from Cardiac Rehab   Personal Goal #2 Pt to identify and limit food sources of sodium     Intervention Plan   Intervention Prescribe, educate and counsel regarding individualized specific dietary modifications aiming towards targeted core components such as weight, hypertension, lipid management, diabetes, heart failure and other comorbidities.;Nutrition handout(s) given to patient.  Low Sodium Nutrition Therapy   Expected Outcomes Short Term Goal: Understand basic principles of dietary content, such as calories, fat, sodium, cholesterol and nutrients.;Long Term Goal: Adherence to prescribed nutrition plan.      Nutrition Discharge: Nutrition Scores:     Nutrition Assessments - 12/09/16 1445      MEDFICTS Scores   Pre Score 18      Nutrition Goals Re-Evaluation:   Nutrition Goals Re-Evaluation:   Nutrition Goals Discharge (Final Nutrition Goals Re-Evaluation):   Psychosocial: Target Goals: Acknowledge presence or absence of significant depression and/or stress, maximize coping skills, provide positive support system. Participant is able to verbalize types and ability to use techniques and skills needed for reducing stress and depression.  Initial Review & Psychosocial Screening:     Initial Psych Review & Screening - 11/19/16 [place]? Yes     Barriers   Psychosocial barriers to participate in program There are no identifiable barriers or psychosocial needs.     Screening Interventions   Interventions Encouraged to exercise      Quality of Life Scores:     Quality of Life - 11/27/16 1447      Quality of Life Scores    Health/Function Pre 17.57 %   Socioeconomic  Pre 25.5 %   Psych/Spiritual Pre 24 %   Family Pre 24 %   GLOBAL Pre 27.71 %  QOL scores somewhat altered due to physical deconditioning, worries and concerns r/t recent cardiac event. pt has 6 grandchildren and 2 children that live all over the country.        PHQ-9: Recent Review Flowsheet Data    Depression screen [facility] 2/9 11/25/2016 10/20/2016 04/09/2016 01/07/2012 11/26/2011   Decreased Interest 0 0 0 0 0   Down, Depressed, Hopeless 1 0 0 0 0   PHQ - 2 Score 1 0 0 0 0     Interpretation of Total Score  Total Score Depression Severity:  1-4 = Minimal depression, 5-9 = Mild depression, 10-14 = Moderate depression, 15-19 = Moderately severe depression, 20-27 = Severe depression   Psychosocial Evaluation and Intervention:     Psychosocial Evaluation - 12/09/16 1525      Psychosocial Evaluation & Interventions   Comments --      Psychosocial Re-Evaluation:     Psychosocial Re-Evaluation    Row Name 12/08/16 7858 01/12/17 1156           Psychosocial Re-Evaluation   Current issues with Current Stress Concerns;Current Anxiety/Panic Current Stress Concerns;Current Anxiety/Panic      Comments pt c/o stress and anxiety from recent cardiac event. pt is currently walking within the house for exercise, able to navigate some steps.  pt feels strength/stamina are improving gradually.  pt exhibits positive spirit with good coping skills and outlook.   pt feels strength/stamina are improving gradually.  pt exhibits positive spirit with good coping skills and outlook.  pt exhibits less health related stress and anxiety as funcational ability increases.        Expected Outcomes pt will continue to exhibit positve outlook with good coping skills.  pt will continue to exhibit positve outlook with good coping skills.       Interventions Stress management education;Encouraged to attend Cardiac Rehabilitation for the exercise;Relaxation education Stress  management education;Encouraged to attend Cardiac Rehabilitation for the exercise;Relaxation education      Continue Psychosocial Services  Follow up required by staff Follow up required by staff      Comments pt demonstrates significant deconditioning from recent cardiac event.   -        Initial Review   Source of Stress Concerns Chronic Illness  -         Psychosocial Discharge (Final Psychosocial Re-Evaluation):     Psychosocial Re-Evaluation - 01/12/17 1156      Psychosocial Re-Evaluation   Current issues with Current Stress Concerns;Current Anxiety/Panic   Comments pt feels strength/stamina are improving gradually.  pt exhibits positive spirit with good coping skills and outlook.  pt exhibits less health related stress and anxiety as funcational ability increases.     Expected Outcomes pt will continue to exhibit positve outlook with good coping skills.    Interventions Stress management education;Encouraged to attend Cardiac Rehabilitation for the exercise;Relaxation education   Continue Psychosocial Services  Follow up required by staff      Vocational Rehabilitation: Provide vocational rehab assistance to qualifying candidates.   Vocational Rehab Evaluation & Intervention:     Vocational Rehab - 11/19/16 1649      Initial Vocational Rehab Evaluation & Intervention   Assessment shows need for Vocational Rehabilitation No      Education: Education Goals: Education classes will be provided on a weekly basis, covering required topics. Participant will state understanding/return demonstration of  topics presented.  Learning Barriers/Preferences:     Learning Barriers/Preferences - 11/19/16 0941      Learning Barriers/Preferences   Learning Barriers Sight   Learning Preferences Written Material;Skilled Demonstration      Education Topics: Count Your Pulse:  -Group instruction provided by verbal instruction, demonstration, patient participation and written  materials to support subject.  Instructors address importance of being able to find your pulse and how to count your pulse when at home without a heart monitor.  Patients get hands on experience counting their pulse with staff help and individually.   Heart Attack, Angina, and Risk Factor Modification:  -Group instruction provided by verbal instruction, video, and written materials to support subject.  Instructors address signs and symptoms of angina and heart attacks.    Also discuss risk factors for heart disease and how to make changes to improve heart health risk factors.   Functional Fitness:  -Group instruction provided by verbal instruction, demonstration, patient participation, and written materials to support subject.  Instructors address safety measures for doing things around the house.  Discuss how to get up and down off the floor, how to pick things up properly, how to safely get out of a chair without assistance, and balance training.   Meditation and Mindfulness:  -Group instruction provided by verbal instruction, patient participation, and written materials to support subject.  Instructor addresses importance of mindfulness and meditation practice to help reduce stress and improve awareness.  Instructor also leads participants through a meditation exercise.    Stretching for Flexibility and Mobility:  -Group instruction provided by verbal instruction, patient participation, and written materials to support subject.  Instructors lead participants through series of stretches that are designed to increase flexibility thus improving mobility.  These stretches are additional exercise for major muscle groups that are typically performed during regular warm up and cool down.   Hands Only CPR Anytime:  -Group instruction provided by verbal instruction, video, patient participation and written materials to support subject.  Instructors co-teach with [facility] video for hands only CPR.   Participants get hands on experience with mannequins.   Nutrition I class: Heart Healthy Eating:  -Group instruction provided by PowerPoint slides, verbal discussion, and written materials to support subject matter. The instructor gives an explanation and review of the Therapeutic Lifestyle Changes diet recommendations, which includes a discussion on lipid goals, dietary fat, sodium, fiber, plant stanol/sterol esters, sugar, and the components of a well-balanced, healthy diet.   CARDIAC REHAB PHASE II EXERCISE from 12/11/2016 in [place]  Date  12/09/16  Educator  RD  Instruction Review Code  Not applicable [class handouts given]      Nutrition II class: Lifestyle Skills:  -Group instruction provided by PowerPoint slides, verbal discussion, and written materials to support subject matter. The instructor gives an explanation and review of label reading, grocery shopping for heart health, heart healthy recipe modifications, and ways to make healthier choices when eating out.   CARDIAC REHAB PHASE II EXERCISE from 12/11/2016 in [place]  Date  12/09/16  Educator  RD  Instruction Review Code  Not applicable [class handouts given]      Diabetes Question & Answer:  -Group instruction provided by PowerPoint slides, verbal discussion, and written materials to support subject matter. The instructor gives an explanation and review of diabetes co-morbidities, pre- and post-prandial blood glucose goals, pre-exercise blood glucose goals, signs, symptoms, and treatment of hypoglycemia and hyperglycemia, and foot care  basics.   Diabetes Blitz:  -Group instruction provided by PowerPoint slides, verbal discussion, and written materials to support subject matter. The instructor gives an explanation and review of the physiology behind type 1 and type 2 diabetes, diabetes medications and rational behind using different medications, pre- and  post-prandial blood glucose recommendations and Hemoglobin A1c goals, diabetes diet, and exercise including blood glucose guidelines for exercising safely.    Portion Distortion:  -Group instruction provided by PowerPoint slides, verbal discussion, written materials, and food models to support subject matter. The instructor gives an explanation of serving size versus portion size, changes in portions sizes over the last 20 years, and what consists of a serving from each food group.   Stress Management:  -Group instruction provided by verbal instruction, video, and written materials to support subject matter.  Instructors review role of stress in heart disease and how to cope with stress positively.     Exercising on Your Own:  -Group instruction provided by verbal instruction, power point, and written materials to support subject.  Instructors discuss benefits of exercise, components of exercise, frequency and intensity of exercise, and end points for exercise.  Also discuss use of nitroglycerin and activating EMS.  Review options of places to exercise outside of rehab.  Review guidelines for sex with heart disease.   Cardiac Drugs I:  -Group instruction provided by verbal instruction and written materials to support subject.  Instructor reviews cardiac drug classes: antiplatelets, anticoagulants, beta blockers, and statins.  Instructor discusses reasons, side effects, and lifestyle considerations for each drug class.   Cardiac Drugs II:  -Group instruction provided by verbal instruction and written materials to support subject.  Instructor reviews cardiac drug classes: angiotensin converting enzyme inhibitors (ACE-I), angiotensin II receptor blockers (ARBs), nitrates, and calcium channel blockers.  Instructor discusses reasons, side effects, and lifestyle considerations for each drug class.   CARDIAC REHAB PHASE II EXERCISE from 12/11/2016 in [place]  Date   12/02/16  Educator  pharmacist  Instruction Review Code  2- meets goals/outcomes      Anatomy and Physiology of the Circulatory System:  -Group instruction provided by verbal instruction, video, and written materials to support subject.  Reviews functional anatomy of heart, how it relates to various diagnoses, and what role the heart plays in the overall system.   Knowledge Questionnaire Score:     Knowledge Questionnaire Score - 11/19/16 1218      Knowledge Questionnaire Score   Pre Score 17/24      Core Components/Risk Factors/Patient Goals at Admission:     Personal Goals and Risk Factors at Admission - 11/19/16 1242      Core Components/Risk Factors/Patient Goals on Admission   Heart Failure Yes   Intervention Provide a combined exercise and nutrition program that is supplemented with education, support and counseling about heart failure. Directed toward relieving symptoms such as shortness of breath, decreased exercise tolerance, and extremity edema.   Expected Outcomes Improve functional capacity of life;Short term: Attendance in program 2-3 days a week with increased exercise capacity. Reported lower sodium intake. Reported increased fruit and vegetable intake. Reports medication compliance.;Short term: Daily weights obtained and reported for increase. Utilizing diuretic protocols set by physician.;Long term: Adoption of self-care skills and reduction of barriers for early signs and symptoms recognition and intervention leading to self-care maintenance.   Hypertension Yes   Intervention Provide education on lifestyle modifcations including regular physical activity/exercise, weight management, moderate sodium restriction and increased consumption of  fresh fruit, vegetables, and low fat dairy, alcohol moderation, and smoking cessation.;Monitor prescription use compliance.   Expected Outcomes Short Term: Continued assessment and intervention until BP is < 140/32mm HG in  hypertensive participants. < 130/63mm HG in hypertensive participants with diabetes, heart failure or chronic kidney disease.;Long Term: Maintenance of blood pressure at goal levels.   Lipids Yes   Intervention Provide education and support for participant on nutrition & aerobic/resistive exercise along with prescribed medications to achieve LDL 70mg , HDL >40mg .   Expected Outcomes Short Term: Participant states understanding of desired cholesterol values and is compliant with medications prescribed. Participant is following exercise prescription and nutrition guidelines.;Long Term: Cholesterol controlled with medications as prescribed, with individualized exercise RX and with personalized nutrition plan. Value goals: LDL < 70mg , HDL > 40 mg.      Core Components/Risk Factors/Patient Goals Review:      Goals and Risk Factor Review    Row Name 12/16/16 1201 01/12/17 1153           Core Components/Risk Factors/Patient Goals Review   Personal Goals Review Heart Failure;Hypertension;Lipids;Other Heart Failure;Hypertension;Lipids;Other      Review pt is looking forward to being able to perform ADLs independantly and return to driving.  pt has noticed subtle differences in her functional ability since CR participation.   pt is looking forward to being able to perform ADLs independantly and return to driving.  pt is pleased with her increased ability to perform household activities such as making the bed.  pt also reports she is resting better which helps her overall feel better.  pt is proactive in her lifestyle modifications for risk factor reduction, following prescribed medication regimen and necessary dietary changes.  .        Expected Outcomes Provide functional mobilty education and exercise programming to assist with increasing strength and stamina in order for patient to regain independence. Provide functional mobilty education and exercise programming to assist with increasing strength and  stamina in order for patient to regain independence.         Core Components/Risk Factors/Patient Goals at Discharge (Final Review):      Goals and Risk Factor Review - 01/12/17 1153      Core Components/Risk Factors/Patient Goals Review   Personal Goals Review Heart Failure;Hypertension;Lipids;Other   Review pt is looking forward to being able to perform ADLs independantly and return to driving.  pt is pleased with her increased ability to perform household activities such as making the bed.  pt also reports she is resting better which helps her overall feel better.  pt is proactive in her lifestyle modifications for risk factor reduction, following prescribed medication regimen and necessary dietary changes.  .     Expected Outcomes Provide functional mobilty education and exercise programming to assist with increasing strength and stamina in order for patient to regain independence.      ITP Comments:     ITP Comments    Row Name 11/19/16 [phone]           ITP Comments Dr.  Loreta Ave, Medical Director          Comments: Pt is making expected progress toward personal goals after completing 15 sessions. Recommend continued exercise and life style modification education including  stress management and relaxation techniques to decrease cardiac risk profile.

## 2017-01-15 ENCOUNTER — Encounter (HOSPITAL_COMMUNITY)
Admission: RE | Admit: 2017-01-15 | Discharge: 2017-01-15 | Disposition: A | Discharge status: Home / Self Care | Payer: Medicare Other | Source: Ambulatory Visit | Attending: Cardiovascular Disease | Admitting: Cardiovascular Disease

## 2017-01-15 DIAGNOSIS — I214 Non-ST elevation (NSTEMI) myocardial infarction: Secondary | ICD-10-CM

## 2017-01-15 DIAGNOSIS — Z955 Presence of coronary angioplasty implant and graft: Secondary | ICD-10-CM | POA: Diagnosis not present

## 2017-01-20 ENCOUNTER — Encounter (HOSPITAL_COMMUNITY): Payer: Medicare Other

## 2017-01-20 ENCOUNTER — Encounter (HOSPITAL_COMMUNITY)
Admission: RE | Admit: 2017-01-20 | Discharge: 2017-01-20 | Disposition: A | Discharge status: Home / Self Care | Payer: Medicare Other | Source: Ambulatory Visit | Attending: Cardiovascular Disease | Admitting: Cardiovascular Disease

## 2017-01-20 DIAGNOSIS — I214 Non-ST elevation (NSTEMI) myocardial infarction: Secondary | ICD-10-CM | POA: Diagnosis not present

## 2017-01-20 DIAGNOSIS — Z955 Presence of coronary angioplasty implant and graft: Secondary | ICD-10-CM | POA: Diagnosis not present

## 2017-01-22 ENCOUNTER — Encounter (HOSPITAL_COMMUNITY): Payer: Medicare Other

## 2017-01-22 ENCOUNTER — Encounter (HOSPITAL_COMMUNITY)
Admission: RE | Admit: 2017-01-22 | Discharge: 2017-01-22 | Disposition: A | Discharge status: Home / Self Care | Payer: Medicare Other | Source: Ambulatory Visit | Attending: Cardiovascular Disease | Admitting: Cardiovascular Disease

## 2017-01-22 ENCOUNTER — Ambulatory Visit (INDEPENDENT_AMBULATORY_CARE_PROVIDER_SITE_OTHER): Payer: Medicare Other | Admitting: Pharmacist

## 2017-01-22 DIAGNOSIS — Z955 Presence of coronary angioplasty implant and graft: Secondary | ICD-10-CM | POA: Diagnosis not present

## 2017-01-22 DIAGNOSIS — I482 Chronic atrial fibrillation, unspecified: Secondary | ICD-10-CM

## 2017-01-22 DIAGNOSIS — Z952 Presence of prosthetic heart valve: Secondary | ICD-10-CM | POA: Diagnosis not present

## 2017-01-22 DIAGNOSIS — Z7901 Long term (current) use of anticoagulants: Secondary | ICD-10-CM | POA: Diagnosis not present

## 2017-01-22 DIAGNOSIS — I214 Non-ST elevation (NSTEMI) myocardial infarction: Secondary | ICD-10-CM | POA: Diagnosis not present

## 2017-01-22 LAB — POCT INR: INR: 1.9

## 2017-01-27 ENCOUNTER — Encounter (HOSPITAL_COMMUNITY)
Admission: RE | Admit: 2017-01-27 | Discharge: 2017-01-27 | Disposition: A | Discharge status: Home / Self Care | Payer: Medicare Other | Source: Ambulatory Visit | Attending: Cardiovascular Disease | Admitting: Cardiovascular Disease

## 2017-01-27 DIAGNOSIS — Z955 Presence of coronary angioplasty implant and graft: Secondary | ICD-10-CM | POA: Insufficient documentation

## 2017-01-27 DIAGNOSIS — I214 Non-ST elevation (NSTEMI) myocardial infarction: Secondary | ICD-10-CM | POA: Diagnosis not present

## 2017-01-29 ENCOUNTER — Encounter (HOSPITAL_COMMUNITY)
Admission: RE | Admit: 2017-01-29 | Discharge: 2017-01-29 | Disposition: A | Discharge status: Home / Self Care | Payer: Medicare Other | Source: Ambulatory Visit | Attending: Cardiovascular Disease | Admitting: Cardiovascular Disease

## 2017-01-29 DIAGNOSIS — Z955 Presence of coronary angioplasty implant and graft: Secondary | ICD-10-CM | POA: Diagnosis not present

## 2017-01-29 DIAGNOSIS — I214 Non-ST elevation (NSTEMI) myocardial infarction: Secondary | ICD-10-CM | POA: Diagnosis not present

## 2017-02-01 ENCOUNTER — Encounter (HOSPITAL_COMMUNITY)
Admission: RE | Admit: 2017-02-01 | Discharge: 2017-02-01 | Disposition: A | Discharge status: Home / Self Care | Payer: Medicare Other | Source: Ambulatory Visit | Attending: Cardiovascular Disease | Admitting: Cardiovascular Disease

## 2017-02-01 ENCOUNTER — Other Ambulatory Visit: Payer: Self-pay | Admitting: Pharmacist Clinician (PhC)/ Clinical Pharmacy Specialist

## 2017-02-01 ENCOUNTER — Ambulatory Visit: Payer: Self-pay

## 2017-02-01 DIAGNOSIS — I214 Non-ST elevation (NSTEMI) myocardial infarction: Secondary | ICD-10-CM

## 2017-02-01 DIAGNOSIS — Z955 Presence of coronary angioplasty implant and graft: Secondary | ICD-10-CM | POA: Diagnosis not present

## 2017-02-01 MED ORDER — WARFARIN SODIUM 5 MG PO TABS: ORAL_TABLET | ORAL | 1 refills | Status: DC

## 2017-02-02 ENCOUNTER — Ambulatory Visit: Payer: Self-pay

## 2017-02-03 ENCOUNTER — Encounter (HOSPITAL_COMMUNITY)
Admission: RE | Admit: 2017-02-03 | Discharge: 2017-02-03 | Disposition: A | Discharge status: Home / Self Care | Payer: Medicare Other | Source: Ambulatory Visit | Attending: Cardiovascular Disease | Admitting: Cardiovascular Disease

## 2017-02-03 DIAGNOSIS — I214 Non-ST elevation (NSTEMI) myocardial infarction: Secondary | ICD-10-CM

## 2017-02-03 DIAGNOSIS — Z955 Presence of coronary angioplasty implant and graft: Secondary | ICD-10-CM

## 2017-02-05 ENCOUNTER — Encounter (HOSPITAL_COMMUNITY)
Admission: RE | Admit: 2017-02-05 | Discharge: 2017-02-05 | Disposition: A | Discharge status: Home / Self Care | Payer: Medicare Other | Source: Ambulatory Visit | Attending: Cardiovascular Disease | Admitting: Cardiovascular Disease

## 2017-02-09 NOTE — Progress Notes (Signed)
Cardiac Individual Treatment Plan  Patient Details  Name: Carrie [name] MRN: [id number] Date of Birth: 01-29-1941 Referring Provider:     CARDIAC REHAB PHASE II ORIENTATION from 11/19/2016 in [place]  Referring Provider  Croitoru, Mihai MD      Initial Encounter Date:    [place] from 11/19/2016 in [place]  Date  11/19/16  Referring Provider  Croitoru, Mihai MD      Visit Diagnosis: 09/29/16 NSTEMI (non-ST elevated myocardial infarction) ([place])  09/30/16 Status post coronary artery stent placement  Patient's Home Medications on Admission:  Current Outpatient Prescriptions:  .  atorvastatin (LIPITOR) 40 MG tablet, Take 1 tablet (40 mg total) by mouth daily at 6 PM. (Patient taking differently: Take 40 mg by mouth daily. ), Disp: 30 tablet, Rfl: 12 .  Biotin 1000 MCG tablet, Take 1,000 mcg by mouth daily., Disp: , Rfl:  .  clopidogrel (PLAVIX) 75 MG tablet, Take 1 tablet (75 mg total) by mouth daily with breakfast., Disp: 30 tablet, Rfl: 12 .  diltiazem (CARTIA XT) 120 MG 24 hr capsule, Take 1 capsule (120 mg total) by mouth daily., Disp: 90 capsule, Rfl: 3 .  docusate sodium (COLACE) 100 MG capsule, Take 200 mg by mouth 2 (two) times daily. , Disp: , Rfl:  .  furosemide (LASIX) 40 MG tablet, Take 1 tablet (40 mg total) by mouth daily., Disp: 90 tablet, Rfl: 3 .  metoprolol tartrate (LOPRESSOR) 25 MG tablet, Take 1 tablet (25 mg total) by mouth 2 (two) times daily., Disp: 180 tablet, Rfl: 3 .  Multiple Vitamin (MULTIVITAMINS PO), Take 1 tablet by mouth daily. , Disp: , Rfl:  .  nitroGLYCERIN (NITROSTAT) 0.4 MG SL tablet, Place 1 tablet (0.4 mg total) under the tongue every 5 (five) minutes as needed for chest pain., Disp: 225 tablet, Rfl: 0 .  Omega-3 Fatty Acids (FISH OIL) 1000 MG CAPS, Take 1,000 mg by mouth daily., Disp: , Rfl:  .  polyethylene glycol (MIRALAX / GLYCOLAX) packet, Take 17 g by  mouth as needed (for constipation). Reported on 03/25/2016, Disp: , Rfl:  .  potassium chloride SA (K-DUR,KLOR-CON) 20 MEQ tablet, Take 2 tablets (40 mEq total) by mouth daily. (Patient taking differently: Take 20 mEq by mouth daily. ), Disp: 180 tablet, Rfl: 3 .  warfarin (COUMADIN) 5 MG tablet, TAKE 1 TO 1.5 TABLETS BY MOUTH DAILY AS DIRECTED, Disp: 120 tablet, Rfl: 1  Past Medical History: Past Medical History:  Diagnosis Date  . Anticoagulated on Coumadin    for mech valve and atrial fib  goal 2.0-2.5  . Anxiety   . Arthritis    "right shoulder" (03/15/2013)  . Atrial fibrillation ([place])   . Atrial flutter ([place])   . CAD (coronary artery disease) 08/11/2016  . CHF (congestive heart failure) ([place])   . Chronic combined systolic and diastolic CHF (congestive heart failure) ([place])    a. 02/2016 Echo: EF 45-50%.  . Diverticula of colon   . Exertional shortness of breath   . GERD (gastroesophageal reflux disease)   . Heart murmur   . Hemorrhage intraabdominal 03/17/2012  . History of blood transfusion    "once" (03/15/2013)  . Hyperlipidemia   . Hypertension   . Liver hemorrhage   . Migraines   . 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 Bi-leaflet St[employer] Jude mechanical prosthesis; b. 02/2016 Echo: EF 45-50%, some degree of MR, sev dil LA/RA, sev  TR.  . NSTEMI (non-ST elevated myocardial infarction) ([place])    a. 02/2016 elev trop/Cath: nonobs dzs,   . Pacemaker    medtronic adapta  . Pneumonia 2009   resolved.? OPD Rx  . Severe tricuspid regurgitation    a. 02/2016 Echo: Ef 45-50%, sev TR, PASP 90mmHg.  . Sick sinus syndrome ([place])    Dr Cristopher [place]. EP study negative. Pacemaker 12/15/06 Medtronic  . Stroke [facility])    "they say I had a stroke last year" denies residual on 03/15/2013  . Subdural hematoma ([facility])     Tobacco Use: History  Smoking Status  . Never Smoker  Smokeless Tobacco  . Never Used    Labs: Recent Review Flowsheet Data    Labs for ITP Cardiac and  Pulmonary Rehab Latest Ref Rng & Units 09/06/2011 03/15/2013 03/12/2016   Cholestrol 0 - 200 mg/dL - - 144   LDLCALC 0 - 99 mg/dL - - 88   HDL >40 mg/dL - - 49   Trlycerides <150 mg/dL - - 35   Hemoglobin A1c <5.7 % - 5.5 -   PHART 7.350 - 7.400 7.463(H) - -   PCO2ART 35.0 - 45.0 mmHg 31.0(L) - -   HCO3 20.0 - 24.0 mEq/L 21.4 - -   TCO2 0 - 100 mmol/L 18.8 - -   ACIDBASEDEF 0.0 - 2.0 mmol/L 0.4 - -   O2SAT % 94.7 - -      Capillary Blood Glucose: Lab Results  Component Value Date   GLUCAP 136 (H) 03/20/2012   GLUCAP 120 (H) 03/18/2012   GLUCAP 184 (H) 03/16/2012     Exercise Target Goals:    Exercise Program Goal: Individual exercise prescription set with THRR, safety & activity barriers. Participant demonstrates ability to understand and report RPE using BORG scale, to self-measure pulse accurately, and to acknowledge the importance of the exercise prescription.  Exercise Prescription Goal: Starting with aerobic activity 30 plus minutes a day, 3 days per week for initial exercise prescription. Provide home exercise prescription and guidelines that participant acknowledges understanding prior to discharge.  Activity Barriers & Risk Stratification:     Activity Barriers & Cardiac Risk Stratification - 11/19/16 0942      Activity Barriers & Cardiac Risk Stratification   Activity Barriers Arthritis;Deconditioning;Muscular Weakness   Cardiac Risk Stratification High      6 Minute Walk:     6 Minute Walk    Row Name 11/19/16 1227         6 Minute Walk   Phase Initial     Distance 896 feet     Walk Time 6 minutes     # of Rest Breaks 0     MPH 1.69     METS 2.02     RPE 13     VO2 Peak 7.1     Symptoms No     Resting HR 71 bpm     Resting BP 128/64     Max Ex. HR 125 bpm     Max Ex. BP 128/74     2 Minute Post BP 124/80        Oxygen Initial Assessment:   Oxygen Re-Evaluation:   Oxygen Discharge (Final Oxygen Re-Evaluation):   Initial Exercise  Prescription:     Initial Exercise Prescription - 11/19/16 1200      Date of Initial Exercise RX and Referring Provider   Date 11/19/16   Referring Provider Croitoru, Mihai MD     Recumbant Bike  Level 1   Minutes 10   METs 1     NuStep   Level 1   SPM 60   Minutes 10   METs 1     Track   Laps 5   Minutes 10   METs 1.84     Prescription Details   Frequency (times per week) 3   Duration Progress to 30 minutes of continuous aerobic without signs/symptoms of physical distress     Intensity   THRR 40-80% of Max Heartrate 58-117   Ratings of Perceived Exertion 11-13   Perceived Dyspnea 0-4     Progression   Progression Continue to progress workloads to maintain intensity without signs/symptoms of physical distress.     Resistance Training   Training Prescription Yes   Weight 1lb   Reps 10-15      Perform Capillary Blood Glucose checks as needed.  Exercise Prescription Changes:      Exercise Prescription Changes    Row Name 12/02/16 1500 12/15/16 1500 12/30/16 1600 01/12/17 1500 01/27/17 1000     Response to Exercise   Blood Pressure (Admit) 128/70 122/82 122/78 112/70 124/62   Blood Pressure (Exercise) 126/70 122/78 124/68 120/80 110/74   Blood Pressure (Exit) 114/62 126/78 124/70 108/78 116/80   Heart Rate (Admit) 80 bpm 76 bpm 76 bpm 72 bpm 84 bpm   Heart Rate (Exercise) 105 bpm 103 bpm 87 bpm 85 bpm 99 bpm   Heart Rate (Exit) 74 bpm 76 bpm 70 bpm 68 bpm 93 bpm   Rating of Perceived Exertion (Exercise) 14 13 11 11 12    Symptoms none none none none none   Duration Continue with 30 min of aerobic exercise without signs/symptoms of physical distress. Continue with 30 min of aerobic exercise without signs/symptoms of physical distress. Continue with 30 min of aerobic exercise without signs/symptoms of physical distress. Continue with 30 min of aerobic exercise without signs/symptoms of physical distress. Continue with 30 min of aerobic exercise without  signs/symptoms of physical distress.   Intensity THRR unchanged THRR unchanged THRR unchanged THRR unchanged THRR unchanged     Progression   Progression Continue to progress workloads to maintain intensity without signs/symptoms of physical distress. Continue to progress workloads to maintain intensity without signs/symptoms of physical distress. Continue to progress workloads to maintain intensity without signs/symptoms of physical distress. Continue to progress workloads to maintain intensity without signs/symptoms of physical distress. Continue to progress workloads to maintain intensity without signs/symptoms of physical distress.   Average METs  -  - 1.4 1.7 1.6     Resistance Training   Training Prescription Yes Yes Yes Yes Yes   Weight 1lb 1lb 1lb 1lb 1lb   Reps 10-15 10-15 10-15 10-15 10-15   Time 10 Minutes 10 Minutes 10 Minutes 10 Minutes 10 Minutes     Recumbant Bike   Level 1 1 3 3 3    Minutes 15 15 15 15   -   METs 1 2.24 1.5 2.4  -     NuStep   Level  - 2 2 2 2    SPM  - 60 60 60 60   Minutes  - 8 15 15 15    METs  - 1.3 1.2 1.1 1.3     Track   Laps 8 4  -  - 7   Minutes 15 7  -  - 15   METs 1.84 2.09  -  - 1.87     Home Exercise Plan   Plans to  continue exercise at  -  - -  hip strengthening and balance exercises -  hip strengthening and balance exercises -  hip strengthening and balance exercises   Frequency  -  - Add 2 additional days to program exercise sessions. Add 2 additional days to program exercise sessions. Add 2 additional days to program exercise sessions.   Initial Home Exercises Provided  -  - 12/16/16 12/16/16 12/16/16   Row Name 02/09/17 1500             Response to Exercise   Blood Pressure (Admit) 114/66       Blood Pressure (Exercise) 122/70       Blood Pressure (Exit) 116/70       Heart Rate (Admit) 67 bpm       Heart Rate (Exercise) 96 bpm       Heart Rate (Exit) 67 bpm       Rating of Perceived Exertion (Exercise) 12       Symptoms  none       Duration Continue with 30 min of aerobic exercise without signs/symptoms of physical distress.       Intensity THRR unchanged         Progression   Progression Continue to progress workloads to maintain intensity without signs/symptoms of physical distress.       Average METs 2.1         Resistance Training   Training Prescription Yes       Weight 1lb       Reps 10-15       Time 10 Minutes         Recumbant Bike   Level 3       Minutes 15       METs 2.2         Track   Laps 10       Minutes 15       METs 2.16         Home Exercise Plan   Plans to continue exercise at Home (comment)  hip strengthening and balance exercises       Frequency Add 2 additional days to program exercise sessions.       Initial Home Exercises Provided 12/16/16          Exercise Comments:      Exercise Comments    Row Name 12/09/16 1456 01/12/17 1523 02/09/17 1544       Exercise Comments Reviewed METs and goals. Pt is tolerating exercise fairly well; will continue to monitor exercise progression. Reviewed METs and goals. Pt is tolerating exercise fairly well; will continue to monitor exercise progression. Reviewed METs and goals. Pt is tolerating exercise fairly well; will continue to monitor exercise progression.        Exercise Goals and Review:      Exercise Goals    Row Name 11/19/16 0941             Exercise Goals   Increase Physical Activity Yes       Intervention Provide advice, education, support and counseling about physical activity/exercise needs.;Develop an individualized exercise prescription for aerobic and resistive training based on initial evaluation findings, risk stratification, comorbidities and participant's personal goals.       Expected Outcomes Achievement of increased cardiorespiratory fitness and enhanced flexibility, muscular endurance and strength shown through measurements of functional capacity and personal statement of participant.        Increase Strength and Stamina Yes  Intervention Provide advice, education, support and counseling about physical activity/exercise needs.;Develop an individualized exercise prescription for aerobic and resistive training based on initial evaluation findings, risk stratification, comorbidities and participant's personal goals.       Expected Outcomes Achievement of increased cardiorespiratory fitness and enhanced flexibility, muscular endurance and strength shown through measurements of functional capacity and personal statement of participant.          Exercise Goals Re-Evaluation :     Exercise Goals Re-Evaluation    Row Name 12/09/16 1452 12/17/16 1422 12/17/16 1424 01/12/17 1522 02/09/17 1544     Exercise Goal Re-Evaluation   Exercise Goals Review Increase Physical Activity;Increase Strenth and Stamina Increase Physical Activity;Increase Strenth and Stamina Increase Physical Activity;Increase Strenth and Stamina Increase Physical Activity;Increase Strenth and Stamina Increase Physical Activity;Increase Strenth and Stamina   Comments Pt is progressing towards 30 minutes of continuous exercise. Pt stated "stamina still needs improvement"  Reiv Reviewed home exercise with pt today.  Pt plans to do hip strengthening/balance h/o for exercise, 2x/week in addition to coming to cardiac rehab.Reviewed THR, pulse, RPE, sign and symptoms, NTG use, and when to call 911 or MD.  Also discussed weather considerations and indoor options.  Pt voiced understanding. Pt is tolerating exercise fairly well in cardiac rehab. Pt is exercising for 30 minutes continuous without symptoms Pt is tolerating exercise fairly well in cardiac rehab. Pt is exercising for 30 minutes continuous without symptoms   Expected Outcomes Pt will work towards 30 minutes of continuous exercise and improve in strength and stamina.  - Pt will work towards 30 minutes of continuous exercise and improve in strength and stamina. Pt will  continue to exercise for 30 minutes to build on aerobic capacity. Pt will continue to exercise for 30 minutes to build on aerobic capacity.       Discharge Exercise Prescription (Final Exercise Prescription Changes):     Exercise Prescription Changes - 02/09/17 1500      Response to Exercise   Blood Pressure (Admit) 114/66   Blood Pressure (Exercise) 122/70   Blood Pressure (Exit) 116/70   Heart Rate (Admit) 67 bpm   Heart Rate (Exercise) 96 bpm   Heart Rate (Exit) 67 bpm   Rating of Perceived Exertion (Exercise) 12   Symptoms none   Duration Continue with 30 min of aerobic exercise without signs/symptoms of physical distress.   Intensity THRR unchanged     Progression   Progression Continue to progress workloads to maintain intensity without signs/symptoms of physical distress.   Average METs 2.1     Resistance Training   Training Prescription Yes   Weight 1lb   Reps 10-15   Time 10 Minutes     Recumbant Bike   Level 3   Minutes 15   METs 2.2     Track   Laps 10   Minutes 15   METs 2.16     Home Exercise Plan   Plans to continue exercise at Home (comment)  hip strengthening and balance exercises   Frequency Add 2 additional days to program exercise sessions.   Initial Home Exercises Provided 12/16/16      Nutrition:  Target Goals: Understanding of nutrition guidelines, daily intake of sodium 1500mg , cholesterol 200mg , calories 30% from fat and 7% or less from saturated fats, daily to have 5 or more servings of fruits and vegetables.  Biometrics:     Pre Biometrics - 11/19/16 1236      Pre Biometrics   Waist Circumference  33 inches   Hip Circumference 38 inches   Waist to Hip Ratio 0.87 %   Triceps Skinfold 17 mm   % Body Fat 36.9 %   Grip Strength 25 kg   Flexibility 7.5 in   Single Leg Stand 1.64 seconds       Nutrition Therapy Plan and Nutrition Goals:     Nutrition Therapy & Goals - 12/09/16 1446      Nutrition Therapy   Diet  Therapeutic Lifestyle Changes   Drug/Food Interactions Coumadin/Vit K     Personal Nutrition Goals   Nutrition Goal Maintain wt around 135 lb at graduation from Cardiac Rehab   Personal Goal #2 Pt to identify and limit food sources of sodium     Intervention Plan   Intervention Prescribe, educate and counsel regarding individualized specific dietary modifications aiming towards targeted core components such as weight, hypertension, lipid management, diabetes, heart failure and other comorbidities.;Nutrition handout(s) given to patient.  Low Sodium Nutrition Therapy   Expected Outcomes Short Term Goal: Understand basic principles of dietary content, such as calories, fat, sodium, cholesterol and nutrients.;Long Term Goal: Adherence to prescribed nutrition plan.      Nutrition Discharge: Nutrition Scores:     Nutrition Assessments - 12/09/16 1445      MEDFICTS Scores   Pre Score 18      Nutrition Goals Re-Evaluation:   Nutrition Goals Re-Evaluation:   Nutrition Goals Discharge (Final Nutrition Goals Re-Evaluation):   Psychosocial: Target Goals: Acknowledge presence or absence of significant depression and/or stress, maximize coping skills, provide positive support system. Participant is able to verbalize types and ability to use techniques and skills needed for reducing stress and depression.  Initial Review & Psychosocial Screening:     Initial Psych Review & Screening - 11/19/16 [place]? Yes     Barriers   Psychosocial barriers to participate in program There are no identifiable barriers or psychosocial needs.     Screening Interventions   Interventions Encouraged to exercise      Quality of Life Scores:     Quality of Life - 11/27/16 1447      Quality of Life Scores   Health/Function Pre 17.57 %   Socioeconomic Pre 25.5 %   Psych/Spiritual Pre 24 %   Family Pre 24 %   GLOBAL Pre 27.71 %  QOL scores somewhat  altered due to physical deconditioning, worries and concerns r/t recent cardiac event. pt has 6 grandchildren and 2 children that live all over the country.        PHQ-9: Recent Review Flowsheet Data    Depression screen [facility] 2/9 11/25/2016 10/20/2016 04/09/2016 01/07/2012 11/26/2011   Decreased Interest 0 0 0 0 0   Down, Depressed, Hopeless 1 0 0 0 0   PHQ - 2 Score 1 0 0 0 0     Interpretation of Total Score  Total Score Depression Severity:  1-4 = Minimal depression, 5-9 = Mild depression, 10-14 = Moderate depression, 15-19 = Moderately severe depression, 20-27 = Severe depression   Psychosocial Evaluation and Intervention:     Psychosocial Evaluation - 12/09/16 1525      Psychosocial Evaluation & Interventions   Comments --      Psychosocial Re-Evaluation:     Psychosocial Re-Evaluation    Row Name 12/08/16 7829 01/12/17 1156 02/05/17 1042         Psychosocial Re-Evaluation   Current issues with Current Stress Concerns;Current  Anxiety/Panic Current Stress Concerns;Current Anxiety/Panic Current Stress Concerns;Current Anxiety/Panic     Comments pt c/o stress and anxiety from recent cardiac event. pt is currently walking within the house for exercise, able to navigate some steps.  pt feels strength/stamina are improving gradually.  pt exhibits positive spirit with good coping skills and outlook.   pt feels strength/stamina are improving gradually.  pt exhibits positive spirit with good coping skills and outlook.  pt exhibits less health related stress and anxiety as funcational ability increases.   pt feels strength/stamina are improving gradually.  pt exhibits positive spirit with good coping skills and outlook.  pt exhibits less health related stress and anxiety as funcational ability increases.       Expected Outcomes pt will continue to exhibit positve outlook with good coping skills.  pt will continue to exhibit positve outlook with good coping skills.  pt will continue to exhibit  positve outlook with good coping skills.      Interventions Stress management education;Encouraged to attend Cardiac Rehabilitation for the exercise;Relaxation education Stress management education;Encouraged to attend Cardiac Rehabilitation for the exercise;Relaxation education Stress management education;Encouraged to attend Cardiac Rehabilitation for the exercise;Relaxation education     Continue Psychosocial Services  Follow up required by staff Follow up required by staff Follow up required by staff     Comments pt demonstrates significant deconditioning from recent cardiac event.   -  -       Initial Review   Source of Stress Concerns Chronic Illness  -  -        Psychosocial Discharge (Final Psychosocial Re-Evaluation):     Psychosocial Re-Evaluation - 02/05/17 1042      Psychosocial Re-Evaluation   Current issues with Current Stress Concerns;Current Anxiety/Panic   Comments pt feels strength/stamina are improving gradually.  pt exhibits positive spirit with good coping skills and outlook.  pt exhibits less health related stress and anxiety as funcational ability increases.     Expected Outcomes pt will continue to exhibit positve outlook with good coping skills.    Interventions Stress management education;Encouraged to attend Cardiac Rehabilitation for the exercise;Relaxation education   Continue Psychosocial Services  Follow up required by staff      Vocational Rehabilitation: Provide vocational rehab assistance to qualifying candidates.   Vocational Rehab Evaluation & Intervention:     Vocational Rehab - 11/19/16 1649      Initial Vocational Rehab Evaluation & Intervention   Assessment shows need for Vocational Rehabilitation No      Education: Education Goals: Education classes will be provided on a weekly basis, covering required topics. Participant will state understanding/return demonstration of topics presented.  Learning Barriers/Preferences:     Learning  Barriers/Preferences - 11/19/16 0941      Learning Barriers/Preferences   Learning Barriers Sight   Learning Preferences Written Material;Skilled Demonstration      Education Topics: Count Your Pulse:  -Group instruction provided by verbal instruction, demonstration, patient participation and written materials to support subject.  Instructors address importance of being able to find your pulse and how to count your pulse when at home without a heart monitor.  Patients get hands on experience counting their pulse with staff help and individually.   Heart Attack, Angina, and Risk Factor Modification:  -Group instruction provided by verbal instruction, video, and written materials to support subject.  Instructors address signs and symptoms of angina and heart attacks.    Also discuss risk factors for heart disease and how to make changes to  improve heart health risk factors.   Functional Fitness:  -Group instruction provided by verbal instruction, demonstration, patient participation, and written materials to support subject.  Instructors address safety measures for doing things around the house.  Discuss how to get up and down off the floor, how to pick things up properly, how to safely get out of a chair without assistance, and balance training.   Meditation and Mindfulness:  -Group instruction provided by verbal instruction, patient participation, and written materials to support subject.  Instructor addresses importance of mindfulness and meditation practice to help reduce stress and improve awareness.  Instructor also leads participants through a meditation exercise.    Stretching for Flexibility and Mobility:  -Group instruction provided by verbal instruction, patient participation, and written materials to support subject.  Instructors lead participants through series of stretches that are designed to increase flexibility thus improving mobility.  These stretches are additional exercise  for major muscle groups that are typically performed during regular warm up and cool down.   Hands Only CPR:  -Group verbal, video, and participation provides a basic overview of AHA guidelines for community CPR. Role-play of emergencies allow participants the opportunity to practice calling for help and chest compression technique with discussion of AED use.   Hypertension: -Group verbal and written instruction that provides a basic overview of hypertension including the most recent diagnostic guidelines, risk factor reduction with self-care instructions and medication management.    Nutrition I class: Heart Healthy Eating:  -Group instruction provided by PowerPoint slides, verbal discussion, and written materials to support subject matter. The instructor gives an explanation and review of the Therapeutic Lifestyle Changes diet recommendations, which includes a discussion on lipid goals, dietary fat, sodium, fiber, plant stanol/sterol esters, sugar, and the components of a well-balanced, healthy diet.   CARDIAC REHAB PHASE II EXERCISE from 12/11/2016 in [place]  Date  12/09/16  Educator  RD  Instruction Review Code  Not applicable [class handouts given]      Nutrition II class: Lifestyle Skills:  -Group instruction provided by PowerPoint slides, verbal discussion, and written materials to support subject matter. The instructor gives an explanation and review of label reading, grocery shopping for heart health, heart healthy recipe modifications, and ways to make healthier choices when eating out.   CARDIAC REHAB PHASE II EXERCISE from 12/11/2016 in [place]  Date  12/09/16  Educator  RD  Instruction Review Code  Not applicable [class handouts given]      Diabetes Question & Answer:  -Group instruction provided by PowerPoint slides, verbal discussion, and written materials to support subject matter. The instructor gives  an explanation and review of diabetes co-morbidities, pre- and post-prandial blood glucose goals, pre-exercise blood glucose goals, signs, symptoms, and treatment of hypoglycemia and hyperglycemia, and foot care basics.   Diabetes Blitz:  -Group instruction provided by PowerPoint slides, verbal discussion, and written materials to support subject matter. The instructor gives an explanation and review of the physiology behind type 1 and type 2 diabetes, diabetes medications and rational behind using different medications, pre- and post-prandial blood glucose recommendations and Hemoglobin A1c goals, diabetes diet, and exercise including blood glucose guidelines for exercising safely.    Portion Distortion:  -Group instruction provided by PowerPoint slides, verbal discussion, written materials, and food models to support subject matter. The instructor gives an explanation of serving size versus portion size, changes in portions sizes over the last 20 years, and what consists of  a serving from each food group.   Stress Management:  -Group instruction provided by verbal instruction, video, and written materials to support subject matter.  Instructors review role of stress in heart disease and how to cope with stress positively.     Exercising on Your Own:  -Group instruction provided by verbal instruction, power point, and written materials to support subject.  Instructors discuss benefits of exercise, components of exercise, frequency and intensity of exercise, and end points for exercise.  Also discuss use of nitroglycerin and activating EMS.  Review options of places to exercise outside of rehab.  Review guidelines for sex with heart disease.   Cardiac Drugs I:  -Group instruction provided by verbal instruction and written materials to support subject.  Instructor reviews cardiac drug classes: antiplatelets, anticoagulants, beta blockers, and statins.  Instructor discusses reasons, side effects,  and lifestyle considerations for each drug class.   Cardiac Drugs II:  -Group instruction provided by verbal instruction and written materials to support subject.  Instructor reviews cardiac drug classes: angiotensin converting enzyme inhibitors (ACE-I), angiotensin II receptor blockers (ARBs), nitrates, and calcium channel blockers.  Instructor discusses reasons, side effects, and lifestyle considerations for each drug class.   CARDIAC REHAB PHASE II EXERCISE from 12/11/2016 in [place]  Date  12/02/16  Educator  pharmacist  Instruction Review Code  2- meets goals/outcomes      Anatomy and Physiology of the Circulatory System:  Group verbal and written instruction and models provide basic cardiac anatomy and physiology, with the coronary electrical and arterial systems. Review of: AMI, Angina, Valve disease, Heart Failure, Peripheral Artery Disease, Cardiac Arrhythmia, Pacemakers, and the ICD.   Other Education:  -Group or individual verbal, written, or video instructions that support the educational goals of the cardiac rehab program.   Knowledge Questionnaire Score:     Knowledge Questionnaire Score - 11/19/16 1218      Knowledge Questionnaire Score   Pre Score 17/24      Core Components/Risk Factors/Patient Goals at Admission:     Personal Goals and Risk Factors at Admission - 11/19/16 1242      Core Components/Risk Factors/Patient Goals on Admission   Heart Failure Yes   Intervention Provide a combined exercise and nutrition program that is supplemented with education, support and counseling about heart failure. Directed toward relieving symptoms such as shortness of breath, decreased exercise tolerance, and extremity edema.   Expected Outcomes Improve functional capacity of life;Short term: Attendance in program 2-3 days a week with increased exercise capacity. Reported lower sodium intake. Reported increased fruit and vegetable intake.  Reports medication compliance.;Short term: Daily weights obtained and reported for increase. Utilizing diuretic protocols set by physician.;Long term: Adoption of self-care skills and reduction of barriers for early signs and symptoms recognition and intervention leading to self-care maintenance.   Hypertension Yes   Intervention Provide education on lifestyle modifcations including regular physical activity/exercise, weight management, moderate sodium restriction and increased consumption of fresh fruit, vegetables, and low fat dairy, alcohol moderation, and smoking cessation.;Monitor prescription use compliance.   Expected Outcomes Short Term: Continued assessment and intervention until BP is < 140/50mm HG in hypertensive participants. < 130/42mm HG in hypertensive participants with diabetes, heart failure or chronic kidney disease.;Long Term: Maintenance of blood pressure at goal levels.   Lipids Yes   Intervention Provide education and support for participant on nutrition & aerobic/resistive exercise along with prescribed medications to achieve LDL 70mg , HDL >40mg .   Expected Outcomes Short  Term: Participant states understanding of desired cholesterol values and is compliant with medications prescribed. Participant is following exercise prescription and nutrition guidelines.;Long Term: Cholesterol controlled with medications as prescribed, with individualized exercise RX and with personalized nutrition plan. Value goals: LDL < 70mg , HDL > 40 mg.      Core Components/Risk Factors/Patient Goals Review:      Goals and Risk Factor Review    Row Name 12/16/16 1201 01/12/17 1153 02/05/17 1040         Core Components/Risk Factors/Patient Goals Review   Personal Goals Review Heart Failure;Hypertension;Lipids;Other Heart Failure;Hypertension;Lipids;Other Heart Failure;Hypertension;Lipids;Other     Review pt is looking forward to being able to perform ADLs independantly and return to driving.  pt has  noticed subtle differences in her functional ability since CR participation.   pt is looking forward to being able to perform ADLs independantly and return to driving.  pt is pleased with her increased ability to perform household activities such as making the bed.  pt also reports she is resting better which helps her overall feel better.  pt is proactive in her lifestyle modifications for risk factor reduction, following prescribed medication regimen and necessary dietary changes.  .   pt is looking forward to being able to perform ADLs independantly.    pt is pleased with her increased ability to perform household activities such as making the bed, doing laundry and dishes.  pt also reports increased strength with decreased dyspnea especially when climbing stairs.   pt is proactive in her lifestyle modifications for risk factor reduction, following prescribed medication regimen and necessary dietary changes.  .       Expected Outcomes Provide functional mobilty education and exercise programming to assist with increasing strength and stamina in order for patient to regain independence. Provide functional mobilty education and exercise programming to assist with increasing strength and stamina in order for patient to regain independence. Provide functional mobilty education and exercise programming to assist with increasing strength and stamina in order for patient to regain independence.        Core Components/Risk Factors/Patient Goals at Discharge (Final Review):      Goals and Risk Factor Review - 02/05/17 1040      Core Components/Risk Factors/Patient Goals Review   Personal Goals Review Heart Failure;Hypertension;Lipids;Other   Review pt is looking forward to being able to perform ADLs independantly.    pt is pleased with her increased ability to perform household activities such as making the bed, doing laundry and dishes.  pt also reports increased strength with decreased dyspnea especially  when climbing stairs.   pt is proactive in her lifestyle modifications for risk factor reduction, following prescribed medication regimen and necessary dietary changes.  .     Expected Outcomes Provide functional mobilty education and exercise programming to assist with increasing strength and stamina in order for patient to regain independence.      ITP Comments:     ITP Comments    Row Name 11/19/16 [phone]           ITP Comments Dr.  Loreta Ave, Medical Director          Comments: Pt is making expected progress toward personal goals after completing 23 sessions. Recommend continued exercise and life style modification education including  stress management and relaxation techniques to decrease cardiac risk profile.

## 2017-02-10 ENCOUNTER — Encounter (HOSPITAL_COMMUNITY)
Admission: RE | Admit: 2017-02-10 | Discharge: 2017-02-10 | Disposition: A | Discharge status: Home / Self Care | Payer: Medicare Other | Source: Ambulatory Visit | Attending: Cardiovascular Disease | Admitting: Cardiovascular Disease

## 2017-02-10 ENCOUNTER — Telehealth: Payer: Self-pay | Admitting: Cardiovascular Disease

## 2017-02-10 DIAGNOSIS — I214 Non-ST elevation (NSTEMI) myocardial infarction: Secondary | ICD-10-CM | POA: Diagnosis not present

## 2017-02-10 DIAGNOSIS — Z955 Presence of coronary angioplasty implant and graft: Secondary | ICD-10-CM

## 2017-02-10 NOTE — Telephone Encounter (Signed)
Pt c/o swelling: STAT is pt has developed SOB within 24 hours  1. How long have you been experiencing swelling? worse last couple of days  2. Where is the swelling located? Lower legs 3.  Are you currently taking a "fluid pill"?  yes 4.  Are you currently SOB? no 5.  Have you traveled recently?  no

## 2017-02-10 NOTE — Telephone Encounter (Signed)
Returned call to patient.She stated she has been sob,increased swelling in both lower legs, left leg worse.Weight stable.Advised to increase Lasix to 80 mg daily for 3 days only then return to normal dose 40 mg daily.Advised to call back if she continues to have sob and swelling.Message sent to Dr.Croitoru for review.

## 2017-02-10 NOTE — Telephone Encounter (Signed)
Agree MCr 

## 2017-02-12 ENCOUNTER — Encounter (HOSPITAL_COMMUNITY)
Admission: RE | Admit: 2017-02-12 | Discharge: 2017-02-12 | Disposition: A | Discharge status: Home / Self Care | Payer: Medicare Other | Source: Ambulatory Visit | Attending: Cardiovascular Disease | Admitting: Cardiovascular Disease

## 2017-02-12 DIAGNOSIS — I214 Non-ST elevation (NSTEMI) myocardial infarction: Secondary | ICD-10-CM | POA: Diagnosis not present

## 2017-02-12 DIAGNOSIS — Z955 Presence of coronary angioplasty implant and graft: Secondary | ICD-10-CM | POA: Diagnosis not present

## 2017-02-12 NOTE — Telephone Encounter (Signed)
Faxed information to Gurney Maxin, Case Manager at [facility].

## 2017-02-17 ENCOUNTER — Encounter (HOSPITAL_COMMUNITY)
Admission: RE | Admit: 2017-02-17 | Discharge: 2017-02-17 | Disposition: A | Discharge status: Home / Self Care | Payer: Medicare Other | Source: Ambulatory Visit | Attending: Cardiovascular Disease | Admitting: Cardiovascular Disease

## 2017-02-17 ENCOUNTER — Encounter: Payer: Medicare Other | Admitting: *Deleted

## 2017-02-17 DIAGNOSIS — Z955 Presence of coronary angioplasty implant and graft: Secondary | ICD-10-CM | POA: Diagnosis not present

## 2017-02-17 DIAGNOSIS — I214 Non-ST elevation (NSTEMI) myocardial infarction: Secondary | ICD-10-CM | POA: Diagnosis not present

## 2017-02-17 NOTE — Progress Notes (Signed)
Daily Session Note  Patient Details  Name: Carrie [name] MRN: [id number] Date of Birth: 07/09/1941 Referring Provider:     CARDIAC REHAB PHASE II ORIENTATION from 11/19/2016 in [place]  Referring Provider  [name] MD      Encounter Date: 02/17/2017  Check In:     Session Check In - 02/17/17 1440      Check-In   Location [facility]   Staff Present Cleda Mccreedy, MS, Exercise Physiologist;Amber [place], MS, ACSM RCEP, Exercise Physiologist;Maria Whitaker, RN, BSN;Arleta Ostrum, RN, BSN   Supervising physician immediately available to respond to emergencies [facility] immediately available   Physician(s) Dr Broadus John   Medication changes reported     No   Fall or balance concerns reported    No   Tobacco Cessation No Change   Warm-up and Cool-down Performed as group-led instruction   Resistance Training Performed No   VAD Patient? No     Pain Assessment   Currently in Pain? No/denies      Capillary Blood Glucose: No results found for this or any previous visit (from the past 24 hour(s)).    History  Smoking Status  . Never Smoker  Smokeless Tobacco  . Never Used    Goals Met:  Exercise tolerated well  Goals Unmet:  HR, weight loss  Comments: pt weight down 1 kg at cardiac rehab today.  Pt reports she took extra lasix x3 days per MD order.  Pt denies dizziness, weakness, fatigue or pain.  Pt tolerated exercise without difficulty.  Pt HR elevated to 80-90s at rest which is normally 60-70s.  Will continue to monitor.    Dr. Fransico Him is Medical Director for Cardiac Rehab at [facility].

## 2017-02-19 ENCOUNTER — Ambulatory Visit (INDEPENDENT_AMBULATORY_CARE_PROVIDER_SITE_OTHER): Payer: Medicare Other | Admitting: Pharmacist Clinician (PhC)/ Clinical Pharmacy Specialist

## 2017-02-19 ENCOUNTER — Other Ambulatory Visit: Payer: Self-pay

## 2017-02-19 ENCOUNTER — Encounter (HOSPITAL_COMMUNITY)
Admission: RE | Admit: 2017-02-19 | Discharge: 2017-02-19 | Disposition: A | Discharge status: Home / Self Care | Payer: Medicare Other | Source: Ambulatory Visit | Attending: Cardiovascular Disease | Admitting: Cardiovascular Disease

## 2017-02-19 ENCOUNTER — Encounter: Payer: Self-pay | Admitting: Cardiology

## 2017-02-19 DIAGNOSIS — Z952 Presence of prosthetic heart valve: Secondary | ICD-10-CM | POA: Diagnosis not present

## 2017-02-19 DIAGNOSIS — Z7901 Long term (current) use of anticoagulants: Secondary | ICD-10-CM | POA: Diagnosis not present

## 2017-02-19 DIAGNOSIS — I482 Chronic atrial fibrillation, unspecified: Secondary | ICD-10-CM

## 2017-02-19 DIAGNOSIS — I214 Non-ST elevation (NSTEMI) myocardial infarction: Secondary | ICD-10-CM | POA: Insufficient documentation

## 2017-02-19 DIAGNOSIS — Z955 Presence of coronary angioplasty implant and graft: Secondary | ICD-10-CM | POA: Diagnosis not present

## 2017-02-19 LAB — POCT INR: INR: 2.1

## 2017-02-19 NOTE — Patient Outreach (Signed)
[facility]) Care Management  02/19/17  Carrie [name] Jan 31, 1941 [id number]  Successful outreach completed with patient. Patient identification verified.  Patient stated that she has been doing "really good." She stated that she has had some swelling last week in her left leg, but called her doctor last Thursday who told her to take extra furosemide for 3 days. She stated that she did that and finished that on Sunday. She stated that her swelling has resolved. She currently denies any shortness of breath or chest pain.   Patient stated that cardiac rehabilitation has been going "real good" and she only has 2 more sessions left before being discharged. She reported that going to cardiac rehab was the "best thing that happened to me."   Patient continues to weigh daily and is able to verbalize when to call her doctor. She has exhibited this knowledge by calling when she was experiencing leg swelling last week. Today's weight was 132 lbs.  Patient stated that her blood pressure as been good too and today's reading is 102/60.  Patient denies any concerns or needs at present and has no further case management needs. RNCM and patient discussed case closure and patient was agreeable. RNCM encouraged to call if any changes occur or if she has any needs that arise and patient verbalized understanding.  Plan: RNCM to perform case closure and will send letter to PCP to update.  [name], RN, BSN, [place] Management Coordinator [phone]

## 2017-02-24 ENCOUNTER — Encounter (HOSPITAL_COMMUNITY)
Admission: RE | Admit: 2017-02-24 | Discharge: 2017-02-24 | Disposition: A | Discharge status: Home / Self Care | Payer: Medicare Other | Source: Ambulatory Visit | Attending: Cardiovascular Disease | Admitting: Cardiovascular Disease

## 2017-02-24 DIAGNOSIS — I214 Non-ST elevation (NSTEMI) myocardial infarction: Secondary | ICD-10-CM

## 2017-02-24 DIAGNOSIS — Z955 Presence of coronary angioplasty implant and graft: Secondary | ICD-10-CM | POA: Diagnosis not present

## 2017-02-25 ENCOUNTER — Ambulatory Visit (INDEPENDENT_AMBULATORY_CARE_PROVIDER_SITE_OTHER): Payer: Medicare Other | Admitting: *Deleted

## 2017-02-25 DIAGNOSIS — I482 Chronic atrial fibrillation, unspecified: Secondary | ICD-10-CM

## 2017-02-26 ENCOUNTER — Encounter (HOSPITAL_COMMUNITY)
Admission: RE | Admit: 2017-02-26 | Discharge: 2017-02-26 | Disposition: A | Discharge status: Home / Self Care | Payer: Medicare Other | Source: Ambulatory Visit | Attending: Cardiovascular Disease | Admitting: Cardiovascular Disease

## 2017-02-26 ENCOUNTER — Encounter (HOSPITAL_COMMUNITY): Payer: Self-pay

## 2017-02-26 VITALS — Ht 59.25 in | Wt 132.1 lb

## 2017-02-26 DIAGNOSIS — Z955 Presence of coronary angioplasty implant and graft: Secondary | ICD-10-CM | POA: Diagnosis not present

## 2017-02-26 DIAGNOSIS — I214 Non-ST elevation (NSTEMI) myocardial infarction: Secondary | ICD-10-CM | POA: Diagnosis not present

## 2017-02-26 LAB — CUP PACEART REMOTE DEVICE CHECK
Battery Remaining Longevity: 98 mo
Battery Voltage: 2.77 V
Date Time Interrogation Session: 20180607194208
Implantable Lead Implant Date: 20080326
Implantable Lead Location: 753860
Implantable Lead Model: 4076
Implantable Pulse Generator Implant Date: 20151103
Lead Channel Impedance Value: 0 Ohm
Lead Channel Setting Sensing Sensitivity: 2 mV
MDC IDC MSMT BATTERY IMPEDANCE: 432 Ohm
MDC IDC MSMT LEADCHNL RV IMPEDANCE VALUE: 469 Ohm
MDC IDC MSMT LEADCHNL RV PACING THRESHOLD AMPLITUDE: 1.125 V
MDC IDC MSMT LEADCHNL RV PACING THRESHOLD PULSEWIDTH: 0.4 ms
MDC IDC MSMT LEADCHNL RV SENSING INTR AMPL: 5.6 mV
MDC IDC SET LEADCHNL RV PACING AMPLITUDE: 2.5 V
MDC IDC SET LEADCHNL RV PACING PULSEWIDTH: 0.4 ms
MDC IDC STAT BRADY RV PERCENT PACED: 18 %

## 2017-02-26 NOTE — Progress Notes (Signed)
Remote pacemaker transmission.   

## 2017-03-02 ENCOUNTER — Telehealth: Payer: Self-pay | Admitting: Cardiovascular Disease

## 2017-03-02 DIAGNOSIS — Z79899 Other long term (current) drug therapy: Secondary | ICD-10-CM

## 2017-03-02 NOTE — Telephone Encounter (Signed)
Carrie [name] [facility]  Nurse Case Manager ) is calling because Carrie [name] has had a weight gain of 4.8lbs and total of 5.2 pounds over 4 days and she said that she is feeling very tired and short of breath and having swelling in her abdomen . Please call the patient . Thanks

## 2017-03-02 NOTE — Telephone Encounter (Signed)
Returned call to [employer] with [facility].[place].

## 2017-03-02 NOTE — Telephone Encounter (Signed)
Spoke with Patrici Ranks RN Patient gained 4.8lbs overnight - from last night - today (total weight gain of 5.2lbs over 4 days) Patient endorses shortness of breath - audible per [facility] RN, more tired, swelling in abdomen Patrici Ranks, RN reports she verified patient's medications and confirmed patient is complaint  Advised will need to speak with provider in office for advice and will follow up with patient.

## 2017-03-02 NOTE — Telephone Encounter (Signed)
Spoke with DOD Dr. Oval Linsey  She advised if patient has good urine output, increase lasix to 40mg  BID for 3 days. If not good urine output, increase lasix to 80mg  BID for 3 days. Needs BMET next week.    Called patient. She states she has good UOP with lasix. Advised to take 40mg  BID for 3 days and call if symptoms no better. She will come in for BMET next week. Lab ordered. Routed to [place], [place] as FYI/follow up.

## 2017-03-03 ENCOUNTER — Encounter: Payer: Self-pay | Admitting: Cardiology

## 2017-03-10 NOTE — Progress Notes (Signed)
Discharge Summary  Patient Details  Name: Carrie [name] MRN: [id number] Date of Birth: [birth year] Referring Provider:     CARDIAC REHAB PHASE II ORIENTATION from 11/19/2016 in [place]  Referring Provider  Croitoru, Mihai MD       Number of Visits: 29   Reason for Discharge:  Patient reached a stable level of exercise., pt completed insurance time allowed sessions.   Smoking History:  History  Smoking Status  . Never Smoker  Smokeless Tobacco  . Never Used    Diagnosis:  09/29/16 NSTEMI (non-ST elevated myocardial infarction) ([place])  09/30/16 Status post coronary artery stent placement  ADL [facility]:   Initial Exercise Prescription:     Initial Exercise Prescription - 11/19/16 1200      Date of Initial Exercise RX and Referring Provider   Date 11/19/16   Referring Provider Croitoru, Mihai MD     Recumbant Bike   Level 1   Minutes 10   METs 1     NuStep   Level 1   SPM 60   Minutes 10   METs 1     Track   Laps 5   Minutes 10   METs 1.84     Prescription Details   Frequency (times per week) 3   Duration Progress to 30 minutes of continuous aerobic without signs/symptoms of physical distress     Intensity   THRR 40-80% of Max Heartrate 58-117   Ratings of Perceived Exertion 11-13   Perceived Dyspnea 0-4     Progression   Progression Continue to progress workloads to maintain intensity without signs/symptoms of physical distress.     Resistance Training   Training Prescription Yes   Weight 1lb   Reps 10-15      Discharge Exercise Prescription (Final Exercise Prescription Changes):     Exercise Prescription Changes - 02/23/17 1500      Response to Exercise   Blood Pressure (Admit) 102/60   Blood Pressure (Exercise) 106/62   Blood Pressure (Exit) 112/70   Heart Rate (Admit) 69 bpm   Heart Rate (Exercise) 75 bpm   Heart Rate (Exit) 69 bpm   Rating of Perceived Exertion (Exercise) 11   Symptoms none   Duration  Continue with 30 min of aerobic exercise without signs/symptoms of physical distress.   Intensity THRR unchanged     Progression   Progression Continue to progress workloads to maintain intensity without signs/symptoms of physical distress.   Average METs 1.7     Resistance Training   Training Prescription Yes   Weight 1lb   Reps 10-15   Time 10 Minutes     Recumbant Bike   Level 3   Minutes 15   METs 2.2     NuStep   Level 3   SPM 60   Minutes 15   METs 1.2     Home Exercise Plan   Plans to continue exercise at Home (comment)  hip strengthening and balance exercises   Frequency Add 2 additional days to program exercise sessions.   Initial Home Exercises Provided 12/16/16      Functional Capacity:     6 Minute Walk    Row Name 11/19/16 1227         6 Minute Walk   Phase Initial     Distance 896 feet     Walk Time 6 minutes     # of Rest Breaks 0     MPH 1.69  METS 2.02     RPE 13     VO2 Peak 7.1     Symptoms No     Resting HR 71 bpm     Resting BP 128/64     Max Ex. HR 125 bpm     Max Ex. BP 128/74     2 Minute Post BP 124/80        Psychological, QOL, Others - Outcomes: PHQ 2/9: Depression screen [facility] 2/9 02/26/2017 11/25/2016 10/20/2016 04/09/2016 01/07/2012  Decreased Interest 0 0 0 0 0  Down, Depressed, Hopeless 0 1 0 0 0  PHQ - 2 Score 0 1 0 0 0    Quality of Life:     Quality of Life - 11/27/16 1447      Quality of Life Scores   Health/Function Pre 17.57 %   Socioeconomic Pre 25.5 %   Psych/Spiritual Pre 24 %   Family Pre 24 %   GLOBAL Pre 27.71 %  QOL scores somewhat altered due to physical deconditioning, worries and concerns r/t recent cardiac event. pt has 6 grandchildren and 2 children that live all over the [place].        Personal Goals: Goals established at orientation with interventions provided to work toward goal.     Personal Goals and Risk Factors at Admission - 11/19/16 1242      Core Components/Risk  Factors/Patient Goals on Admission   Heart Failure Yes   Intervention Provide a combined exercise and nutrition program that is supplemented with education, support and counseling about heart failure. Directed toward relieving symptoms such as shortness of breath, decreased exercise tolerance, and extremity edema.   Expected Outcomes Improve functional capacity of life;Short term: Attendance in program 2-3 days a week with increased exercise capacity. Reported lower sodium intake. Reported increased fruit and vegetable intake. Reports medication compliance.;Short term: Daily weights obtained and reported for increase. Utilizing diuretic protocols set by physician.;Long term: Adoption of self-care skills and reduction of barriers for early signs and symptoms recognition and intervention leading to self-care maintenance.   Hypertension Yes   Intervention Provide education on lifestyle modifcations including regular physical activity/exercise, weight management, moderate sodium restriction and increased consumption of fresh fruit, vegetables, and low fat dairy, alcohol moderation, and smoking cessation.;Monitor prescription use compliance.   Expected Outcomes Short Term: Continued assessment and intervention until BP is < 140/21mm HG in hypertensive participants. < 130/68mm HG in hypertensive participants with diabetes, heart failure or chronic kidney disease.;Long Term: Maintenance of blood pressure at goal levels.   Lipids Yes   Intervention Provide education and support for participant on nutrition & aerobic/resistive exercise along with prescribed medications to achieve LDL 70mg , HDL >40mg .   Expected Outcomes Short Term: Participant states understanding of desired cholesterol values and is compliant with medications prescribed. Participant is following exercise prescription and nutrition guidelines.;Long Term: Cholesterol controlled with medications as prescribed, with individualized exercise RX and with  personalized nutrition plan. Value goals: LDL < 70mg , HDL > 40 mg.       Personal Goals Discharge:     Goals and Risk Factor Review    Row Name 12/16/16 1201 01/12/17 1153 02/05/17 1040 02/26/17 1538       Core Components/Risk Factors/Patient Goals Review   Personal Goals Review Heart Failure;Hypertension;Lipids;Other Heart Failure;Hypertension;Lipids;Other Heart Failure;Hypertension;Lipids;Other Heart Failure;Hypertension;Lipids;Other    Review pt is looking forward to being able to perform ADLs independantly and return to driving.  pt has noticed subtle differences in her functional ability since CR  participation.   pt is looking forward to being able to perform ADLs independantly and return to driving.  pt is pleased with her increased ability to perform household activities such as making the bed.  pt also reports she is resting better which helps her overall feel better.  pt is proactive in her lifestyle modifications for risk factor reduction, following prescribed medication regimen and necessary dietary changes.  .   pt is looking forward to being able to perform ADLs independantly.    pt is pleased with her increased ability to perform household activities such as making the bed, doing laundry and dishes.  pt also reports increased strength with decreased dyspnea especially when climbing stairs.   pt is proactive in her lifestyle modifications for risk factor reduction, following prescribed medication regimen and necessary dietary changes.  .   pt is very pleased with her increase in functional ability.      Expected Outcomes Provide functional mobilty education and exercise programming to assist with increasing strength and stamina in order for patient to regain independence. Provide functional mobilty education and exercise programming to assist with increasing strength and stamina in order for patient to regain independence. Provide functional mobilty education and exercise programming to  assist with increasing strength and stamina in order for patient to regain independence. Provide functional mobilty education and exercise programming to assist with increasing strength and stamina in order for patient to regain independence.       Nutrition & Weight - Outcomes:     Pre Biometrics - 11/19/16 1236      Pre Biometrics   Waist Circumference 33 inches   Hip Circumference 38 inches   Waist to Hip Ratio 0.87 %   Triceps Skinfold 17 mm   % Body Fat 36.9 %   Grip Strength 25 kg   Flexibility 7.5 in   Single Leg Stand 1.64 seconds       Nutrition:     Nutrition Therapy & Goals - 12/09/16 1446      Nutrition Therapy   Diet Therapeutic Lifestyle Changes   Drug/Food Interactions Coumadin/Vit K     Personal Nutrition Goals   Nutrition Goal Maintain wt around 135 lb at graduation from Cardiac Rehab   Personal Goal #2 Pt to identify and limit food sources of sodium     Intervention Plan   Intervention Prescribe, educate and counsel regarding individualized specific dietary modifications aiming towards targeted core components such as weight, hypertension, lipid management, diabetes, heart failure and other comorbidities.;Nutrition handout(s) given to patient.  Low Sodium Nutrition Therapy   Expected Outcomes Short Term Goal: Understand basic principles of dietary content, such as calories, fat, sodium, cholesterol and nutrients.;Long Term Goal: Adherence to prescribed nutrition plan.      Nutrition Discharge:     Nutrition Assessments - 12/09/16 1445      MEDFICTS Scores   Pre Score 18      Education Questionnaire Score:     Knowledge Questionnaire Score - 11/19/16 1218      Knowledge Questionnaire Score   Pre Score 17/24      Goals reviewed with patient; copy given to patient.

## 2017-03-22 ENCOUNTER — Ambulatory Visit (INDEPENDENT_AMBULATORY_CARE_PROVIDER_SITE_OTHER): Payer: Medicare Other | Admitting: Pharmacist

## 2017-03-22 DIAGNOSIS — Z952 Presence of prosthetic heart valve: Secondary | ICD-10-CM | POA: Diagnosis not present

## 2017-03-22 DIAGNOSIS — Z7901 Long term (current) use of anticoagulants: Secondary | ICD-10-CM | POA: Diagnosis not present

## 2017-03-22 DIAGNOSIS — I482 Chronic atrial fibrillation, unspecified: Secondary | ICD-10-CM

## 2017-03-22 LAB — POCT INR: INR: 2

## 2017-03-26 NOTE — Addendum Note (Signed)
Encounter addended by: Jewel Baize, RD on: 03/26/2017 10:38 AM<BR>    Actions taken: Flowsheet data copied forward, Visit Navigator Flowsheet section accepted

## 2017-04-01 ENCOUNTER — Telehealth: Payer: Self-pay | Admitting: Cardiovascular Disease

## 2017-04-01 NOTE — Telephone Encounter (Signed)
New message    Pt has question on potassium is she suppose to take 2 tablets a day

## 2017-04-01 NOTE — Telephone Encounter (Addendum)
The patient called wanting to confirm the correct amount of Furosemide and Potassium that she should be taking. According to previous notes, she should be taking potassium 40 meq daily (she has been taking one 20 meq tablet). She is currently taking Furosemide 40 mg tablet daily. She verbalized her understanding and stated that she would call back if she needed anything else.

## 2017-04-21 ENCOUNTER — Encounter: Payer: Self-pay | Admitting: Cardiovascular Disease

## 2017-04-21 ENCOUNTER — Ambulatory Visit (INDEPENDENT_AMBULATORY_CARE_PROVIDER_SITE_OTHER): Payer: Medicare Other | Admitting: Cardiovascular Disease

## 2017-04-21 ENCOUNTER — Ambulatory Visit (INDEPENDENT_AMBULATORY_CARE_PROVIDER_SITE_OTHER): Payer: Medicare Other | Admitting: Pharmacist Clinician (PhC)/ Clinical Pharmacy Specialist

## 2017-04-21 VITALS — BP 118/70 | HR 85 | Ht 59.0 in | Wt 131.6 lb

## 2017-04-21 DIAGNOSIS — R05 Cough: Secondary | ICD-10-CM

## 2017-04-21 DIAGNOSIS — I5042 Chronic combined systolic (congestive) and diastolic (congestive) heart failure: Secondary | ICD-10-CM | POA: Diagnosis not present

## 2017-04-21 DIAGNOSIS — I482 Chronic atrial fibrillation, unspecified: Secondary | ICD-10-CM

## 2017-04-21 DIAGNOSIS — Z952 Presence of prosthetic heart valve: Secondary | ICD-10-CM

## 2017-04-21 DIAGNOSIS — Z79899 Other long term (current) drug therapy: Secondary | ICD-10-CM

## 2017-04-21 DIAGNOSIS — R059 Cough, unspecified: Secondary | ICD-10-CM

## 2017-04-21 DIAGNOSIS — I495 Sick sinus syndrome: Secondary | ICD-10-CM | POA: Diagnosis not present

## 2017-04-21 DIAGNOSIS — Z950 Presence of cardiac pacemaker: Secondary | ICD-10-CM

## 2017-04-21 DIAGNOSIS — I10 Essential (primary) hypertension: Secondary | ICD-10-CM | POA: Diagnosis not present

## 2017-04-21 DIAGNOSIS — I2510 Atherosclerotic heart disease of native coronary artery without angina pectoris: Secondary | ICD-10-CM

## 2017-04-21 DIAGNOSIS — I2721 Secondary pulmonary arterial hypertension: Secondary | ICD-10-CM

## 2017-04-21 DIAGNOSIS — Z7901 Long term (current) use of anticoagulants: Secondary | ICD-10-CM | POA: Diagnosis not present

## 2017-04-21 DIAGNOSIS — R0602 Shortness of breath: Secondary | ICD-10-CM | POA: Diagnosis not present

## 2017-04-21 DIAGNOSIS — I071 Rheumatic tricuspid insufficiency: Secondary | ICD-10-CM

## 2017-04-21 LAB — POCT INR: INR: 2

## 2017-04-21 MED ORDER — FUROSEMIDE 40 MG PO TABS: 60.0000 mg | ORAL_TABLET | Freq: Every day | ORAL | 3 refills | Status: DC

## 2017-04-21 NOTE — Patient Instructions (Addendum)
Medication Instructions: Dr Sallyanne Kuster has recommended making the following medication changes: 1. INCREASE Furosemide to 60 mg (1.5 tablets) daily  Labwork: Your physician recommends that you return for lab work on the day of your stress test.  Testing/Procedures: 1. [place] physician has requested that you have a lexiscan myoview. For further information please visit [email or web address]. Please follow instruction sheet, as given.  2. Chest XRay - Your physician has requested that you have a chest x-ray.  A chest x-ray takes a picture of the organs and structures inside the chest, including the heart, lungs, and blood vessels. This test can show several things, including, whether the heart is enlarges; whether fluid is building up in the lungs; and whether pacemaker / defibrillator leads are still in place.  Follow-up: Dr Sallyanne Kuster recommends that you schedule a follow-up appointment on August 21st, 2018.  If you need a refill on your cardiac medications before your next appointment, please call your pharmacy.

## 2017-04-21 NOTE — Progress Notes (Signed)
Patient ID: Carrie [name], female   DOB: 11-Aug-1941, 76 y.o.   MRN: [id number] Patient ID: Carrie [name], female   DOB: 27-Mar-1941, 76 y.o.   MRN: [id number]    Cardiology Office Note    Date:  04/21/2017   ID:  Carrie [name], DOB [birth year], MRN [id number]  PCP:  Lucianne Lei, MD  Cardiologist:   [name], MD   Chief Complaint  Patient presents with  . Follow-up    History of Present Illness:  Carrie [name] is a 76 y.o. 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 The peak troponin was only 5. Taking warfarin and clopidogrel. She is not on aspirin.   She has noticed worsening dyspnea. In fact she says that her dyspnea has been present ever since she had the myocardial infarction and stent.Her husband has noticed that she has a persistent cough for the last several days and believes that her cough is worse when she is supine. She denies orthopnea or PND. She has not had any recent falls or bleeding.  Quite surprisingly, today her electrocardiogram shows sinus rhythm. We had thought that she might be in permanent atrial fibrillation. She has a nonspecific intraventricular conduction delay with left axis deviation.   Pacemaker interrogation shows about 14% ventricular pacing. Ventricular rate control is good with rare episodes of tachycardia and overall the heart rate histogram distribution is acceptable. Estimated generator longevity is 8 years. She has a single-chamber Medtronic [place]  single chamber pacemaker (implant 2008, generator change 2015) set at lower rate limit of 50 bpm, to try to limit the frequency of ventricular pacing, since this seems to worsen heart failure.  In December 2012 she had subarachnoid hemorrhage and in June 2013 she had intrahepatic hemorrhage requiring arterial embolization and we are keeping her INR in the lower range (2.0-2.5). She has normal coronary arteries by previous angiography (2004) normal left ventricular systolic function by echocardiography (Oct 2015).  Past Medical History:  Diagnosis Date  . Anticoagulated on Coumadin    for mech valve and atrial fib  goal 2.0-2.5  . Anxiety   . Arthritis    "right shoulder" (03/15/2013)  . Atrial fibrillation ([place])   . Atrial flutter ([place])   . CAD (coronary artery disease) 08/11/2016  . CHF (congestive heart failure) ([place])   . Chronic combined systolic and diastolic CHF (congestive heart failure) ([place])    a. 02/2016 Echo: EF 45-50%.  . Diverticula of colon   . Exertional shortness of breath   . GERD (gastroesophageal reflux disease)   . Heart murmur   . Hemorrhage intraabdominal 03/17/2012  . History of blood transfusion    "once" (03/15/2013)  . Hyperlipidemia   . Hypertension   . Liver hemorrhage   . Migraines   . 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 Bi-leaflet St. Jude mechanical prosthesis; b. 02/2016 Echo: EF 45-50%, some degree of MR, sev dil LA/RA, sev TR.  . NSTEMI (non-ST elevated myocardial infarction) ([place])    a. 02/2016 elev trop/Cath: nonobs dzs,   . Pacemaker    medtronic adapta  .  Pneumonia 2009   resolved.? OPD Rx  . Severe tricuspid regurgitation    a. 02/2016 Echo: Ef 45-50%, sev TR, PASP 60mmHg.  . Sick sinus syndrome ([place])    Dr Cristopher Peru. EP study negative. Pacemaker 12/15/06 Medtronic  . Stroke [facility])    "they say I had a stroke last year" denies residual on 03/15/2013  . Subdural hematoma [facility])     Past Surgical History:  Procedure  Laterality Date  . APPENDECTOMY    . CARDIAC CATHETERIZATION  04/02/97   R&L:severe MR/pulmonary hypertension  . CARDIAC CATHETERIZATION N/A 03/16/2016   Procedure: Left Heart Cath and Coronary Angiography;  Surgeon: [name], MD;  Location: [facility];  Service: Cardiovascular;  Laterality: N/A;  . CARDIAC CATHETERIZATION N/A 09/30/2016   Procedure: Left Heart Cath and Coronary Angiography;  Surgeon: [name], MD;  Location: [place] [facility];  Service: Cardiovascular;  Laterality: N/A;  . CARDIAC CATHETERIZATION N/A 09/30/2016   Procedure: Coronary Stent Intervention;  Surgeon: [name], MD;  3.5 x 12 mm resolute Onyx  to ostial RCA  . CATARACT EXTRACTION W/ INTRAOCULAR LENS  IMPLANT, BILATERAL Bilateral 2013  . CORONARY ANGIOPLASTY    . DILATION AND CURETTAGE OF UTERUS     "had fibroids" (03/15/2013)  . EXPLORATORY LAPAROTOMY     "had a growth on my intestines" (03/15/2013)  . HAMMER TOE SURGERY Right   . INSERT / REPLACE / REMOVE PACEMAKER  12/15/2006   Medtronic  . MITRAL VALVE REPLACEMENT  1998   St Jude mechanical; Dr. [name]  . PACEMAKER PLACEMENT  12/15/06   medtronic adapta for SSS  . PERMANENT PACEMAKER GENERATOR CHANGE N/A 07/24/2014   Procedure: PERMANENT PACEMAKER GENERATOR CHANGE;  Surgeon: [name], MD;  Location: [facility];  Service: Cardiovascular;  Laterality: N/A;  . PERSANTINE CARDIOLITE  08/07/03   mild inf. ischemia   . TEE WITHOUT CARDIOVERSION  09/23/2011   Procedure: TRANSESOPHAGEAL ECHOCARDIOGRAM (TEE);  Surgeon: [name];  Location: [facility];  Service: Cardiovascular;  Laterality: N/A;  . TONSILLECTOMY    . TUBAL LIGATION    . US ECHOCARDIOGRAPHY  11/19/2011   EF 50-55%,RA mod to severely dilated,LA severely dilated,trace MR,small vegetation or mass on the MV,AOV mildly scleroticmild PI, RV pressure 40-25mmHg    Outpatient Medications Prior to Visit  Medication Sig Dispense Refill  . atorvastatin (LIPITOR)  40 MG tablet Take 1 tablet (40 mg total) by mouth daily at 6 PM. (Patient taking differently: Take 40 mg by mouth daily. ) 30 tablet 12  . Biotin 1000 MCG tablet Take 1,000 mcg by mouth daily.    . clopidogrel (PLAVIX) 75 MG tablet Take 1 tablet (75 mg total) by mouth daily with breakfast. 30 tablet 12  . diltiazem (CARTIA XT) 120 MG 24 hr capsule Take 1 capsule (120 mg total) by mouth daily. 90 capsule 3  . docusate sodium (COLACE) 100 MG capsule Take 200 mg by mouth 2 (two) times daily.     . furosemide (LASIX) 40 MG tablet Take 1 tablet (40 mg total) by mouth daily. 90 tablet 3  . metoprolol tartrate (LOPRESSOR) 25 MG tablet Take 1 tablet (25 mg total) by mouth 2 (two) times daily. 180 tablet 3  . Multiple Vitamin (MULTIVITAMINS PO) Take 1 tablet by mouth daily.     . nitroGLYCERIN (NITROSTAT) 0.4 MG SL tablet Place 1 tablet (0.4 mg total) under the tongue every 5 (five) minutes as needed for chest pain. [place]  tablet 0  . Omega-3 Fatty Acids (FISH OIL) 1000 MG CAPS Take 1,000 mg by mouth daily.    . polyethylene glycol (MIRALAX / GLYCOLAX) packet Take 17 g by mouth as needed (for constipation). Reported on 03/25/2016    . potassium chloride SA (K-DUR,KLOR-CON) 20 MEQ tablet Take 2 tablets (40 mEq total) by mouth daily. (Patient taking differently: Take 20 mEq by mouth daily. ) 180 tablet 3  . warfarin (COUMADIN) 5 MG tablet TAKE 1 TO 1.5 TABLETS BY MOUTH DAILY AS DIRECTED 120 tablet 1   No facility-administered medications prior to visit.      Allergies:   Codeine   Social History   Social History  . Marital status: Married    Spouse name: N/A  . Number of children: N/A  . Years of education: N/A   Social History Main Topics  . Smoking status: Never Smoker  . Smokeless tobacco: Never Used  . Alcohol use No  . Drug use: No  . Sexual activity: No   Other Topics Concern  . None   Social History Narrative  . None     Family History:  The patient's family history includes Cancer in  her mother and sister; Diabetes in her sister; Healthy in her brother, brother, brother, and sister; Heart attack in her maternal grandfather; Kidney disease in her daughter; Leukemia in her sister; Stroke in her brother and father.   ROS:   Please see the history of present illness.    ROS All other systems reviewed and are negative.   PHYSICAL EXAM:   VS:  BP 118/70   Pulse 85   Ht 4\' 11"  (1.499 m)   Wt 131 lb 9.6 oz (59.7 kg)   BMI 26.58 kg/m    GEN: Well nourished, well developed, in no acute distress   General: Alert, oriented x3, no distress, well developed, well-nourished Head: no evidence of trauma, PERRL, EOMI, no exophtalmos or lid lag, no myxedema, no xanthelasma; normal ears, nose and oropharynx Neck: 10-12 cm jugular venous pulsations with extremely prominent V waves all the way to the jaw; brisk carotid pulses without delay and no carotid bruits Chest: clear to auscultation, no signs of consolidation by percussion or palpation, normal fremitus, symmetrical and full respiratory excursions Cardiovascular: normal position and quality of the apical impulse, regular rhythm, crisp prosthetic valve sounds, 3/6 holosystolic murmur at the left lower sternal border, no diastolic murmurs, rubs or gallops Abdomen: no tenderness or distention, no masses by palpation, no abnormal pulsatility or arterial bruits, normal bowel sounds, no hepatosplenomegaly Extremities: no clubbing, cyanosis or edema; 2+ radial, ulnar and brachial pulses bilaterally; 2+ right femoral, posterior tibial and dorsalis pedis pulses; 2+ left femoral, posterior tibial and dorsalis pedis pulses; no subclavian or femoral bruits Neurological: grossly nonfocal Psych: euthymic mood, full affect  Wt Readings from Last 3 Encounters:  04/21/17 131 lb 9.6 oz (59.7 kg)  03/18/17 132 lb 0.9 oz (59.9 kg)  12/17/16 139 lb 3.2 oz (63.1 kg)      Studies/Labs Reviewed:   EKG:  EKG is ordered today. Normal sinus rhythm,left  axis deviation, QRS 142 ms, QTC 497 ms  Recent Labs: 09/02/2016: Magnesium 2.2 09/30/2016: TSH 1.666 10/27/2016: BUN 21; Creat 0.95; Potassium 5.0; Sodium 132 12/17/2016: HGB 9.6; Platelets 158   Lipid Panel     Component Value Date/Time   CHOL 144 03/12/2016 0430   TRIG 35 03/12/2016 0430   HDL 49 03/12/2016 0430   CHOLHDL 2.9 03/12/2016 0430  VLDL 7 03/12/2016 0430   LDLCALC 88 03/12/2016 0430     ASSESSMENT:    No diagnosis found.   PLAN:  In order of problems listed above:  1. CAD s/p NSTEMI and DES-RCA: Currently on dual therapy with warfarin and Plavix. Has previously had serious hemorrhagic complications in her liver and brain. She does not have any angina, but is having worsening dyspnea. Roughly 6 months following the revascularization procedure, conceivably she could have in-stent restenosis. We'll schedule for a [employer]. Coronary angiography should not be undertaken lightly due to the risk of thrombotic/bleeding complications in the setting of mechanical mitral valve prosthesis. She is on a highly active statin in last year her LDL was fair at 88. Target LDL< 70. Previously discussed stopping clopidogrel therapy after 6 months, but will continue until we get the results of her stress test. 2. CHF: Seems mildly hypervolemic by exam, NYHA functional class II. Weight is actually lower than at her previous appointment. Note the fact that she has moderate pulmonary artery hypertension with normal left ventricular end-diastolic pressure (EDP 13 at cath). She may have fixed pulmonary hypertension related to previous mitral valve disease. Clinical and echo findings are of right heart failure, not left heart failure. 3. PAH: Normal LVEDP, normally functioning mitral valve prosthesis. She may have some degree of fixed pulmonary hypertension related to previous mitral valve disease. No clear symptoms of obstructive sleep apnea. There is no history of pulmonary embolism or  COPD 4. AFib: previously estimated to have permanent atrial fibrillation, surprisingly today presents with normal sinus rhythm. It doesn't seem to be associated with any particular improvement hemodynamically. At this point antiarrhythmics do not appear to be justified 5. Anticoagulation with previous severe bleeding complications,  keeping INR very tightly in the 2.0-2.5 range, especially while on clopidogrel. 6. PPM: Normal device function, keep at the current device settings. Check every 3 months via either office visit or remote downloads. Unfortunately she has a single chamber device. 7. Mechanical mitral valve replacement: She has a large diameter valve that had normal function by a relatively recent echocardiogram. 8. Moderate to severe TR is probably the major cause of her edema and the jugular venous distention. She may have some degree of rheumatic valve disease and it appears she has the same degree of moderate pulmonary artery hypertension by her most recent echo. The presence of a transvalvular pacemaker lead may also be contributory. Physical exam is compatible with severe tricuspid regurgitation. I don't think she would be a great candidate for repeat surgery at age 21, but this opportunity might need to be taken if there is evidence of serious hemodynamic complications or liver abnormalities. 9. HTN is well controlled. 10. Cough: could be a sign of congestive heart failure, but will check chest x-ray to look for primary lung problems      Medication Adjustments/Labs and Tests Ordered: Current medicines are reviewed at length with the patient today.  Concerns regarding medicines are outlined above.  Medication changes, Labs and Tests ordered today are listed in the Patient Instructions below. There are no Patient Instructions on file for this visit.     Signed, [name], MD  04/21/2017 11:48 AM    [facility] [address], [place], [place]   [address] Phone: [phone]; Fax: [phone]

## 2017-04-23 ENCOUNTER — Telehealth (HOSPITAL_COMMUNITY): Payer: Self-pay

## 2017-04-23 NOTE — Telephone Encounter (Signed)
Encounter complete. 

## 2017-04-28 ENCOUNTER — Ambulatory Visit (HOSPITAL_COMMUNITY)
Admission: RE | Admit: 2017-04-28 | Discharge: 2017-04-28 | Disposition: A | Discharge status: Home / Self Care | Payer: Medicare Other | Source: Ambulatory Visit | Attending: Cardiology | Admitting: Cardiology

## 2017-04-28 DIAGNOSIS — Z8249 Family history of ischemic heart disease and other diseases of the circulatory system: Secondary | ICD-10-CM | POA: Insufficient documentation

## 2017-04-28 DIAGNOSIS — Z8673 Personal history of transient ischemic attack (TIA), and cerebral infarction without residual deficits: Secondary | ICD-10-CM | POA: Diagnosis not present

## 2017-04-28 DIAGNOSIS — I5042 Chronic combined systolic (congestive) and diastolic (congestive) heart failure: Secondary | ICD-10-CM | POA: Insufficient documentation

## 2017-04-28 DIAGNOSIS — R002 Palpitations: Secondary | ICD-10-CM | POA: Insufficient documentation

## 2017-04-28 DIAGNOSIS — I110 Hypertensive heart disease with heart failure: Secondary | ICD-10-CM | POA: Diagnosis not present

## 2017-04-28 DIAGNOSIS — R5383 Other fatigue: Secondary | ICD-10-CM | POA: Diagnosis not present

## 2017-04-28 DIAGNOSIS — R0602 Shortness of breath: Secondary | ICD-10-CM | POA: Diagnosis not present

## 2017-04-28 DIAGNOSIS — I2510 Atherosclerotic heart disease of native coronary artery without angina pectoris: Secondary | ICD-10-CM | POA: Diagnosis not present

## 2017-04-28 DIAGNOSIS — R05 Cough: Secondary | ICD-10-CM | POA: Insufficient documentation

## 2017-04-29 LAB — MYOCARDIAL PERFUSION IMAGING
CHL CUP NUCLEAR SRS: 9
CHL CUP NUCLEAR SSS: 8
CSEPPHR: 56 {beats}/min
LV dias vol: 99 mL (ref 46–106)
LVSYSVOL: 32 mL
NUC STRESS TID: 0.91
Rest HR: 60 {beats}/min
SDS: 1

## 2017-04-29 MED ORDER — REGADENOSON 0.4 MG/5ML IV SOLN
0.4000 mg | Freq: Once | INTRAVENOUS | Status: AC
  Administered 2017-04-28: 0.4 mg via INTRAVENOUS

## 2017-04-29 MED ORDER — TECHNETIUM TC 99M TETROFOSMIN IV KIT
30.1000 | PACK | Freq: Once | INTRAVENOUS | Status: AC | PRN
  Administered 2017-04-28: 30.1 via INTRAVENOUS

## 2017-04-29 MED ORDER — TECHNETIUM TC 99M TETROFOSMIN IV KIT
10.5000 | PACK | Freq: Once | INTRAVENOUS | Status: AC | PRN
  Administered 2017-04-28: 10.5 via INTRAVENOUS

## 2017-04-29 NOTE — Telephone Encounter (Signed)
Open error 

## 2017-05-07 LAB — CUP PACEART INCLINIC DEVICE CHECK
Battery Impedance: 482 Ohm
Date Time Interrogation Session: 20180817162742
Implantable Lead Location: 753860
Implantable Pulse Generator Implant Date: 20151103
Lead Channel Impedance Value: 454 Ohm
Lead Channel Pacing Threshold Pulse Width: 0.4 ms
Lead Channel Sensing Intrinsic Amplitude: 4 mV
Lead Channel Setting Pacing Amplitude: 2.5 V
Lead Channel Setting Pacing Pulse Width: 0.4 ms
MDC IDC LEAD IMPLANT DT: 20080326
MDC IDC MSMT BATTERY REMAINING LONGEVITY: 96 mo
MDC IDC MSMT BATTERY VOLTAGE: 2.78 V
MDC IDC MSMT LEADCHNL RV PACING THRESHOLD AMPLITUDE: 1 V
MDC IDC SET LEADCHNL RV SENSING SENSITIVITY: 2 mV
MDC IDC STAT BRADY RV PERCENT PACED: 14.1 %

## 2017-05-11 ENCOUNTER — Encounter: Payer: Self-pay | Admitting: Cardiovascular Disease

## 2017-05-11 ENCOUNTER — Ambulatory Visit (INDEPENDENT_AMBULATORY_CARE_PROVIDER_SITE_OTHER): Payer: Medicare Other | Admitting: Cardiovascular Disease

## 2017-05-11 VITALS — BP 92/50 | HR 88 | Ht 59.0 in | Wt 129.0 lb

## 2017-05-11 DIAGNOSIS — I071 Rheumatic tricuspid insufficiency: Secondary | ICD-10-CM | POA: Diagnosis not present

## 2017-05-11 DIAGNOSIS — Z79899 Other long term (current) drug therapy: Secondary | ICD-10-CM

## 2017-05-11 DIAGNOSIS — Z952 Presence of prosthetic heart valve: Secondary | ICD-10-CM

## 2017-05-11 DIAGNOSIS — I482 Chronic atrial fibrillation, unspecified: Secondary | ICD-10-CM

## 2017-05-11 DIAGNOSIS — R0602 Shortness of breath: Secondary | ICD-10-CM | POA: Diagnosis not present

## 2017-05-11 DIAGNOSIS — Z7901 Long term (current) use of anticoagulants: Secondary | ICD-10-CM | POA: Diagnosis not present

## 2017-05-11 DIAGNOSIS — I5042 Chronic combined systolic (congestive) and diastolic (congestive) heart failure: Secondary | ICD-10-CM | POA: Diagnosis not present

## 2017-05-11 DIAGNOSIS — I2510 Atherosclerotic heart disease of native coronary artery without angina pectoris: Secondary | ICD-10-CM

## 2017-05-11 DIAGNOSIS — Z950 Presence of cardiac pacemaker: Secondary | ICD-10-CM

## 2017-05-11 DIAGNOSIS — I2721 Secondary pulmonary arterial hypertension: Secondary | ICD-10-CM | POA: Diagnosis not present

## 2017-05-11 DIAGNOSIS — I10 Essential (primary) hypertension: Secondary | ICD-10-CM | POA: Diagnosis not present

## 2017-05-11 NOTE — Progress Notes (Signed)
Patient ID: Carrie [name], female   DOB: 10-14-1940, 76 y.o.   MRN: [id number] Patient ID: Carrie [name], female   DOB: 08/14/1941, 76 y.o.   MRN: [id number]    Cardiology Office Note    Date:  05/11/2017   ID:  Carrie [name], DOB Feb 26, 1941, MRN [id number]  PCP:  Lucianne Lei, MD  Cardiologist:   [name], MD   Chief Complaint  Patient presents with  . Follow-up    History of Present Illness:  Carrie [name] is a 76 y.o. 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 The peak troponin was only 5. Taking warfarin and clopidogrel.    Her dyspnea is really no different today and seems to be less of a complaint. She does not have orthopnea although she does have some problems with insomnia. Her cough seems to be better. Her nuclear stress test did not show any evidence of ischemia and showed normal left ventricular systolic function. Her edema is reasonably well controlled with diuretics and compression stockings. Her blood pressure has been running on the low end with systolic readings often in the double digits, but she has not had dizziness, syncope or falls. She has not had any overt bleeding problems.  Apprised Truman Hayward, at her last appointment she was in sinus rhythm, which have not been documented in a long time, but today her rhythm is clearly irregular and consistent with atrial fibrillation again  She has a single-chamber Medtronic Sensia single chamber pacemaker (implant 2008, generator  change 2015) set at lower rate limit of 50 bpm, to try to limit the frequency of ventricular pacing, since this seems to worsen heart failure.  In December 2012 she had subarachnoid hemorrhage and in June 2013 she had intrahepatic hemorrhage requiring arterial embolization and we are keeping her INR in the lower range (2.0-2.5). She has normal coronary arteries by previous angiography (2004) normal left ventricular systolic function by echocardiography (Oct 2015).  Past Medical History:  Diagnosis Date  . Anticoagulated on Coumadin    for mech valve and atrial fib  goal 2.0-2.5  . Anxiety   . Arthritis    "right shoulder" (03/15/2013)  . Atrial fibrillation ([place])   . Atrial flutter ([place])   . CAD (coronary artery disease) 08/11/2016  . CHF (congestive heart failure) ([place])   . Chronic combined systolic and diastolic CHF (congestive heart failure) ([place])    a. 02/2016 Echo: EF 45-50%.  . Diverticula of colon   . Exertional shortness of breath   . GERD (gastroesophageal reflux disease)   . Heart murmur   . Hemorrhage intraabdominal 03/17/2012  . History of blood transfusion    "once" (03/15/2013)  . Hyperlipidemia   . Hypertension   . Liver hemorrhage   . Migraines   . 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 Bi-leaflet St. Jude mechanical prosthesis; b. 02/2016 Echo: EF 45-50%, some degree of MR, sev dil LA/RA, sev TR.  . NSTEMI (non-ST elevated myocardial infarction) ([place])    a. 02/2016 elev trop/Cath: nonobs dzs,   . Pacemaker    medtronic adapta  .  Pneumonia 2009   resolved.? OPD Rx  . Severe tricuspid regurgitation    a. 02/2016 Echo: Ef 45-50%, sev TR, PASP 69mmHg.  . Sick sinus syndrome ([place])    Dr Cristopher Peru. EP study negative. Pacemaker 12/15/06 Medtronic  . Stroke [facility])    "they say I had a stroke last year" denies residual on 03/15/2013  . Subdural hematoma [facility])     Past Surgical History:  Procedure Laterality Date  . APPENDECTOMY    . CARDIAC  CATHETERIZATION  04/02/97   R&L:severe MR/pulmonary hypertension  . CARDIAC CATHETERIZATION N/A 03/16/2016   Procedure: Left Heart Cath and Coronary Angiography;  Surgeon: [name], MD;  Location: [facility] [facility];  Service: Cardiovascular;  Laterality: N/A;  . CARDIAC CATHETERIZATION N/A 09/30/2016   Procedure: Left Heart Cath and Coronary Angiography;  Surgeon: [name], MD;  Location: [place] [facility];  Service: Cardiovascular;  Laterality: N/A;  . CARDIAC CATHETERIZATION N/A 09/30/2016   Procedure: Coronary Stent Intervention;  Surgeon: [name], MD;  3.5 x 12 mm resolute Onyx  to ostial RCA  . CATARACT EXTRACTION W/ INTRAOCULAR LENS  IMPLANT, BILATERAL Bilateral 2013  . CORONARY ANGIOPLASTY    . DILATION AND CURETTAGE OF UTERUS     "had fibroids" (03/15/2013)  . EXPLORATORY LAPAROTOMY     "had a growth on my intestines" (03/15/2013)  . HAMMER TOE SURGERY Right   . INSERT / REPLACE / REMOVE PACEMAKER  12/15/2006   Medtronic  . MITRAL VALVE REPLACEMENT  1998   St Jude mechanical; Dr. [name]  . PACEMAKER PLACEMENT  12/15/06   medtronic adapta for SSS  . PERMANENT PACEMAKER GENERATOR CHANGE N/A 07/24/2014   Procedure: PERMANENT PACEMAKER GENERATOR CHANGE;  Surgeon: [name], MD;  Location: [facility];  Service: Cardiovascular;  Laterality: N/A;  . PERSANTINE CARDIOLITE  08/07/03   mild inf. ischemia   . TEE WITHOUT CARDIOVERSION  09/23/2011   Procedure: TRANSESOPHAGEAL ECHOCARDIOGRAM (TEE);  Surgeon: [name];  Location: [facility];  Service: Cardiovascular;  Laterality: N/A;  . TONSILLECTOMY    . TUBAL LIGATION    . US ECHOCARDIOGRAPHY  11/19/2011   EF 50-55%,RA mod to severely dilated,LA severely dilated,trace MR,small vegetation or mass on the MV,AOV mildly scleroticmild PI, RV pressure 40-40mmHg    Outpatient Medications Prior to Visit  Medication Sig Dispense Refill  . atorvastatin (LIPITOR) 40 MG tablet Take 1 tablet (40 mg total) by  mouth daily at 6 PM. (Patient taking differently: Take 40 mg by mouth daily. ) 30 tablet 12  . Biotin 1000 MCG tablet Take 1,000 mcg by mouth daily.    Marland Kitchen diltiazem (CARTIA XT) 120 MG 24 hr capsule Take 1 capsule (120 mg total) by mouth daily. 90 capsule 3  . docusate sodium (COLACE) 100 MG capsule Take 200 mg by mouth 2 (two) times daily.     . furosemide (LASIX) 40 MG tablet Take 1.5 tablets (60 mg total) by mouth daily. 135 tablet 3  . metoprolol tartrate (LOPRESSOR) 25 MG tablet Take 1 tablet (25 mg total) by mouth 2 (two) times daily. 180 tablet 3  . Multiple Vitamin (MULTIVITAMINS PO) Take 1 tablet by mouth daily.     . nitroGLYCERIN (NITROSTAT) 0.4 MG SL tablet Place 1 tablet (0.4 mg total) under the tongue every 5 (five) minutes as needed for chest pain. 225 tablet 0  . Omega-3 Fatty Acids (FISH OIL) 1000 MG CAPS Take 1,000 mg by mouth daily.    Marland Kitchen  polyethylene glycol (MIRALAX / GLYCOLAX) packet Take 17 g by mouth as needed (for constipation). Reported on 03/25/2016    . potassium chloride SA (K-DUR,KLOR-CON) 20 MEQ tablet Take 2 tablets (40 mEq total) by mouth daily. (Patient taking differently: Take 20 mEq by mouth daily. ) 180 tablet 3  . warfarin (COUMADIN) 5 MG tablet TAKE 1 TO 1.5 TABLETS BY MOUTH DAILY AS DIRECTED 120 tablet 1  . clopidogrel (PLAVIX) 75 MG tablet Take 1 tablet (75 mg total) by mouth daily with breakfast. 30 tablet 12   No facility-administered medications prior to visit.      Allergies:   Codeine   Social History   Social History  . Marital status: Married    Spouse name: N/A  . Number of children: N/A  . Years of education: N/A   Social History Main Topics  . Smoking status: Never Smoker  . Smokeless tobacco: Never Used  . Alcohol use No  . Drug use: No  . Sexual activity: No   Other Topics Concern  . None   Social History Narrative  . None     Family History:  The patient's family history includes Cancer in her mother and sister; Diabetes in her  sister; Healthy in her brother, brother, brother, and sister; Heart attack in her maternal grandfather; Kidney disease in her daughter; Leukemia in her sister; Stroke in her brother and father.   ROS:   Please see the history of present illness.    ROS All other systems reviewed and are negative.   PHYSICAL EXAM:   VS:  BP (!) 92/50 (BP Location: Right Arm, Patient Position: Sitting, Cuff Size: Normal)   Pulse 88   Ht 4\' 11"  (1.499 m)   Wt 129 lb (58.5 kg)   BMI 26.05 kg/m      General: Alert, oriented x3, no distressGEN: Well nourished, well developed  Head: no evidence of trauma, PERRL, EOMI, no exophtalmos or lid lag, no myxedema, no xanthelasma; normal ears, nose and oropharynx Neck: Very prominent V waves, almost to the angle of the jaw in her jugular venous pulsations ; brisk carotid pulses without delay and no carotid bruits Chest: clear to auscultation, no signs of consolidation by percussion or palpation, normal fremitus, symmetrical and full respiratory excursions Cardiovascular: normal position and quality of the apical impulse, regular rhythm, crisp prosthetic valve sounds, 3/6 holosystolic murmur at the left lower sternal border, no diastolic murmurs, rubs or gallops Abdomen: no tenderness or distention, no masses by palpation, no abnormal pulsatility or arterial bruits, normal bowel sounds, no hepatosplenomegaly Extremities: no clubbing, cyanosis ; wearing compression stockings with minimal edema; 2+ radial, ulnar and brachial pulses bilaterally; 2+ right femoral, posterior tibial and dorsalis pedis pulses; 2+ left femoral, posterior tibial and dorsalis pedis pulses; no subclavian or femoral bruits Neurological: grossly nonfocal, alert and oriented 3 Psych: euthymic mood, full affect  Wt Readings from Last 3 Encounters:  05/11/17 129 lb (58.5 kg)  04/28/17 131 lb (59.4 kg)  04/21/17 131 lb 9.6 oz (59.7 kg)      Studies/Labs Reviewed:   EKG:  EKG is not ordered  today.   Recent Labs: 09/02/2016: Magnesium 2.2 09/30/2016: TSH 1.666 10/27/2016: BUN 21; Creat 0.95; Potassium 5.0; Sodium 132 12/17/2016: HGB 9.6; Platelets 158   Lipid Panel     Component Value Date/Time   CHOL 144 03/12/2016 0430   TRIG 35 03/12/2016 0430   HDL 49 03/12/2016 0430   CHOLHDL 2.9 03/12/2016 0430   VLDL  7 03/12/2016 0430   LDLCALC 88 03/12/2016 0430     ASSESSMENT:    1. Shortness of breath   2. Medication management   3. Chronic combined systolic and diastolic congestive heart failure ([place])   4. Coronary artery disease involving native coronary artery of native heart without angina pectoris   5. PAH (pulmonary artery hypertension) ([place])   6. Chronic atrial fibrillation (H[facility])   7. Chronic anticoagulation, ( INR goal 2.0-2.5 due to history of subdural hematoma and liver hemorrhage)   8. Pacemaker - single chamber Medtronic Adapta, 2008   9. H/O mitral valve replacement with mechanical valve   10. Moderate to severe tricuspid regurgitation   11. Essential hypertension      PLAN:  In order of problems listed above:  1. CHF: Seems mildly hypervolemic by exam, NYHA functional class II. Weight is even lower than at her previous appointment. Note the fact that she has moderate pulmonary artery hypertension with normal left ventricular end-diastolic pressure (EDP 13 at cath). She probably have fixed pulmonary hypertension related to previous mitral valve disease. Clinical and echo findings are of right heart failure, not left heart failure. Skull is diuretic dose adjustment based on symptoms and degree of edema, but especially based on daily weight log. Check renal function and electrolytes. 2. CAD s/p NSTEMI and DES-RCA: Currently on dual therapy with warfarin and Plavix. Has previously had serious hemorrhagic complications in her liver and brain. She does not have any angina, And her nuclear stress test was low risk. 7 months have passed since her revascularization  procedure. Even though she had a small nonSTEMI, I think we have reached a point where the benefit of antiplatelet therapy might be matched by the risk of bleeding. Recommend stopping clopidogrel. She is on a highly active statin in last year her LDL was fair at 88. Target LDL< 70.  3. PAH: Normal LVEDP, normally functioning mitral valve prosthesis. She may have some degree of fixed pulmonary hypertension related to previous mitral valve disease. No clear symptoms of obstructive sleep apnea. There is no history of pulmonary embolism or COPD 4. AFib: previously estimated to have permanent atrial fibrillation, surprisingly at her last appointment had normal sinus rhythm, but now back in atrial fibrillation. The change in rhythm doesn't seem to be associated with any particular change hemodynamically. Antiarrhythmics do not appear to be justified 5. Anticoagulation with previous severe bleeding complications,  keeping INR very tightly in the 2.0-2.5 range. 6. PPM: Normal device function at check a few weeks ago 7. Mechanical mitral valve replacement: She has a large diameter valve that had normal function by a relatively recent echocardiogram. 8. Moderate to severe TR is probably the major cause of her edema and the jugular venous distention. She may have some degree of rheumatic valve disease and it appears she has the same degree of moderate pulmonary artery hypertension by her most recent echo. The presence of a transvalvular pacemaker lead may also be contributory. Physical exam is compatible with severe tricuspid regurgitation. I don't think she would be a great candidate for repeat surgery at age 39, but this opportunity might need to be taken if there is evidence of serious hemodynamic complications or liver abnormalities. We discussed this today. At this point no evidence of liver abnormalities. 9. HTN: The pressures that she quite low. The diltiazem and metoprolol are being used to avoid rapid  ventricular rates. The dose of diuretic is being adjusted according to weight and edema. I think we  can tolerate this degree of hypotension since she is asymptomatic.  Medication Adjustments/Labs and Tests Ordered: Current medicines are reviewed at length with the patient today.  Concerns regarding medicines are outlined above.  Medication changes, Labs and Tests ordered today are listed in the Patient Instructions below. Patient Instructions  Medication Instructions: Dr Sallyanne Kuster has recommended making the following medication changes: 1. STOP Clopidogrel  Labwork: Your physician recommends that you return for lab work TODAY.  Testing/Procedures: NONE ORDERED  Follow-up: Dr Sallyanne Kuster recommends that you schedule a follow-up appointment in 3 months with a pacemaker check.  If you need a refill on your cardiac medications before your next appointment, please call your pharmacy.      Signed, [name], MD  05/11/2017 12:05 PM    [address], [place], [place]  [address] Phone: [phone]; Fax: [phone]

## 2017-05-11 NOTE — Patient Instructions (Signed)
Medication Instructions: Dr Sallyanne Kuster has recommended making the following medication changes: 1. STOP Clopidogrel  Labwork: Your physician recommends that you return for lab work TODAY.  Testing/Procedures: NONE ORDERED  Follow-up: Dr Sallyanne Kuster recommends that you schedule a follow-up appointment in 3 months with a pacemaker check.  If you need a refill on your cardiac medications before your next appointment, please call your pharmacy.

## 2017-05-12 LAB — BASIC METABOLIC PANEL
BUN/Creatinine Ratio: 22 (ref 12–28)
BUN: 22 mg/dL (ref 8–27)
CALCIUM: 9.5 mg/dL (ref 8.7–10.3)
CHLORIDE: 102 mmol/L (ref 96–106)
CO2: 20 mmol/L (ref 20–29)
Creatinine, Ser: 1.01 mg/dL — ABNORMAL HIGH (ref 0.57–1.00)
GFR, EST AFRICAN AMERICAN: 63 mL/min/{1.73_m2} (ref >59)
GFR, EST NON AFRICAN AMERICAN: 55 mL/min/{1.73_m2} — AB (ref >59)
Glucose: 89 mg/dL (ref 65–99)
POTASSIUM: 4.4 mmol/L (ref 3.5–5.2)
Sodium: 136 mmol/L (ref 134–144)

## 2017-05-12 LAB — BRAIN NATRIURETIC PEPTIDE: BNP: 1295.5 pg/mL — ABNORMAL HIGH (ref 0.0–100.0)

## 2017-05-12 LAB — MAGNESIUM: Magnesium: 2.5 mg/dL — ABNORMAL HIGH (ref 1.6–2.3)

## 2017-05-18 ENCOUNTER — Ambulatory Visit (INDEPENDENT_AMBULATORY_CARE_PROVIDER_SITE_OTHER): Payer: Medicare Other | Admitting: Pharmacist Clinician (PhC)/ Clinical Pharmacy Specialist

## 2017-05-18 DIAGNOSIS — Z7901 Long term (current) use of anticoagulants: Secondary | ICD-10-CM | POA: Diagnosis not present

## 2017-05-18 DIAGNOSIS — Z952 Presence of prosthetic heart valve: Secondary | ICD-10-CM | POA: Diagnosis not present

## 2017-05-18 DIAGNOSIS — I482 Chronic atrial fibrillation, unspecified: Secondary | ICD-10-CM

## 2017-05-18 LAB — POCT INR: INR: 2

## 2017-05-27 ENCOUNTER — Telehealth: Payer: Self-pay | Admitting: Cardiology

## 2017-05-27 ENCOUNTER — Ambulatory Visit (INDEPENDENT_AMBULATORY_CARE_PROVIDER_SITE_OTHER): Payer: Medicare Other | Admitting: *Deleted

## 2017-05-27 DIAGNOSIS — I481 Persistent atrial fibrillation: Secondary | ICD-10-CM

## 2017-05-27 DIAGNOSIS — I4819 Other persistent atrial fibrillation: Secondary | ICD-10-CM

## 2017-05-27 NOTE — Telephone Encounter (Signed)
Confirmed remote transmission w/ pt husband.   

## 2017-05-28 NOTE — Progress Notes (Signed)
Remote pacemaker transmission.   

## 2017-06-01 ENCOUNTER — Encounter: Payer: Self-pay | Admitting: Cardiology

## 2017-06-14 ENCOUNTER — Ambulatory Visit (INDEPENDENT_AMBULATORY_CARE_PROVIDER_SITE_OTHER): Payer: Medicare Other | Admitting: Pharmacist Clinician (PhC)/ Clinical Pharmacy Specialist

## 2017-06-14 DIAGNOSIS — Z952 Presence of prosthetic heart valve: Secondary | ICD-10-CM | POA: Diagnosis not present

## 2017-06-14 DIAGNOSIS — Z7901 Long term (current) use of anticoagulants: Secondary | ICD-10-CM | POA: Diagnosis not present

## 2017-06-14 DIAGNOSIS — I482 Chronic atrial fibrillation, unspecified: Secondary | ICD-10-CM

## 2017-06-14 LAB — POCT INR: INR: 2.3

## 2017-06-18 DIAGNOSIS — Z23 Encounter for immunization: Secondary | ICD-10-CM | POA: Diagnosis not present

## 2017-06-18 LAB — CUP PACEART REMOTE DEVICE CHECK
Battery Impedance: 508 Ohm
Battery Remaining Longevity: 94 mo
Date Time Interrogation Session: 20180906212005
Implantable Lead Location: 753860
Implantable Lead Model: 4076
Implantable Pulse Generator Implant Date: 20151103
Lead Channel Pacing Threshold Amplitude: 1.125 V
Lead Channel Pacing Threshold Pulse Width: 0.4 ms
Lead Channel Setting Sensing Sensitivity: 2 mV
MDC IDC LEAD IMPLANT DT: 20080326
MDC IDC MSMT BATTERY VOLTAGE: 2.77 V
MDC IDC MSMT LEADCHNL RA IMPEDANCE VALUE: 0 Ohm
MDC IDC MSMT LEADCHNL RV IMPEDANCE VALUE: 469 Ohm
MDC IDC SET LEADCHNL RV PACING AMPLITUDE: 2.5 V
MDC IDC SET LEADCHNL RV PACING PULSEWIDTH: 0.4 ms
MDC IDC STAT BRADY RV PERCENT PACED: 10 %

## 2017-07-07 ENCOUNTER — Other Ambulatory Visit: Payer: Self-pay | Admitting: Cardiovascular Disease

## 2017-07-12 ENCOUNTER — Ambulatory Visit (INDEPENDENT_AMBULATORY_CARE_PROVIDER_SITE_OTHER): Payer: Medicare Other | Admitting: Pharmacist

## 2017-07-12 ENCOUNTER — Telehealth: Payer: Self-pay | Admitting: Cardiovascular Disease

## 2017-07-12 DIAGNOSIS — Z79899 Other long term (current) drug therapy: Secondary | ICD-10-CM

## 2017-07-12 DIAGNOSIS — I482 Chronic atrial fibrillation, unspecified: Secondary | ICD-10-CM

## 2017-07-12 DIAGNOSIS — Z952 Presence of prosthetic heart valve: Secondary | ICD-10-CM | POA: Diagnosis not present

## 2017-07-12 DIAGNOSIS — Z7901 Long term (current) use of anticoagulants: Secondary | ICD-10-CM

## 2017-07-12 LAB — POCT INR: INR: 3

## 2017-07-12 NOTE — Telephone Encounter (Signed)
Please ask her to increase the furosemide to 60 mg twice daily (morning and early afternoon) and also double her current potassium supplement intake. There is discrepancy between the prescribed potassium and how she is reportedly taking it. Please confirm her current potassium dose. Check basic metabolic panel on Thursday. Please ask her to call us back on Wednesday with her weight and symptoms. MCr

## 2017-07-12 NOTE — Telephone Encounter (Signed)
Returned the call to the patient. She stated that she has been experiencing shortness of breath for the past week. Her weight today was 129 which is a 7.4 pound weight gain in the last 8 days.  Blood pressure today was 102/65, heart rate was 98. Yesterday it was 111/70 with a heart rate of 70. She stated that she feels like her heart is pounding harder now when ambulating and when sitting. She has not been ambulating like she normally would be due to shortness of breath.  She does wear her compression stockings. She currently takes 20 mEq of potassium and furosemide 60 mg daily. Will route to the provider for his recommendation.

## 2017-07-12 NOTE — Telephone Encounter (Signed)
[place], [facility] Nurse Case manager call requesting to speak with RN to report pt has had a 7.4 lb weight gain over the pass 8 days. Pt also has some SOB with minimum exertion. Sonia Baller ask for the pt to receive a call back.

## 2017-07-13 ENCOUNTER — Telehealth: Payer: Self-pay | Admitting: *Deleted

## 2017-07-13 MED ORDER — FUROSEMIDE 40 MG PO TABS: 60.0000 mg | ORAL_TABLET | Freq: Two times a day (BID) | ORAL | 3 refills | Status: DC

## 2017-07-13 MED ORDER — POTASSIUM CHLORIDE CRYS ER 20 MEQ PO TBCR: 80.0000 meq | EXTENDED_RELEASE_TABLET | Freq: Every day | ORAL | 3 refills | Status: DC

## 2017-07-13 NOTE — Telephone Encounter (Signed)
Spoke with the patient's husband and informed him that his wife should double the amount of potassium and therefore take 80 mEq a day. He verbalized his understanding.

## 2017-07-13 NOTE — Telephone Encounter (Signed)
Returned the call to the patient to instruct her on Dr. Victorino December instructions.  The patient confirmed that she has been taking 20 mEq of potassium. She has been advised to take 40 mEq (2 tablets) daily of potassium and to take the furosemide twice daily, in the morning and early afternoon.  She will call tomorrow with an update and come in thursday for a BMET. The patient repeated back the instructions and verbalized her understanding.

## 2017-07-13 NOTE — Telephone Encounter (Signed)
The patient's family member called to state that the patient is currently taking 40 mEq of potassium and not 20 mEq as the patient had stated. Will route to the physician for recommendation on whether or not to increase the potassium with the increase in the lasix.

## 2017-07-13 NOTE — Telephone Encounter (Signed)
Yes, please double whatever she was on. MCr

## 2017-07-15 DIAGNOSIS — Z79899 Other long term (current) drug therapy: Secondary | ICD-10-CM | POA: Diagnosis not present

## 2017-07-16 LAB — BASIC METABOLIC PANEL
BUN / CREAT RATIO: 28 (ref 12–28)
BUN: 29 mg/dL — ABNORMAL HIGH (ref 8–27)
CO2: 18 mmol/L — ABNORMAL LOW (ref 20–29)
CREATININE: 1.04 mg/dL — AB (ref 0.57–1.00)
Calcium: 9.9 mg/dL (ref 8.7–10.3)
Chloride: 103 mmol/L (ref 96–106)
GFR calc Af Amer: 61 mL/min/{1.73_m2} (ref >59)
GFR, EST NON AFRICAN AMERICAN: 53 mL/min/{1.73_m2} — AB (ref >59)
Glucose: 121 mg/dL — ABNORMAL HIGH (ref 65–99)
Potassium: 4.9 mmol/L (ref 3.5–5.2)
SODIUM: 139 mmol/L (ref 134–144)

## 2017-08-09 ENCOUNTER — Ambulatory Visit (INDEPENDENT_AMBULATORY_CARE_PROVIDER_SITE_OTHER): Payer: Medicare Other | Admitting: Pharmacist Clinician (PhC)/ Clinical Pharmacy Specialist

## 2017-08-09 DIAGNOSIS — I482 Chronic atrial fibrillation, unspecified: Secondary | ICD-10-CM

## 2017-08-09 DIAGNOSIS — Z952 Presence of prosthetic heart valve: Secondary | ICD-10-CM | POA: Diagnosis not present

## 2017-08-09 DIAGNOSIS — Z7901 Long term (current) use of anticoagulants: Secondary | ICD-10-CM | POA: Diagnosis not present

## 2017-08-09 LAB — POCT INR: INR: 2.5

## 2017-08-16 DIAGNOSIS — M8589 Other specified disorders of bone density and structure, multiple sites: Secondary | ICD-10-CM | POA: Diagnosis not present

## 2017-08-16 DIAGNOSIS — Z1231 Encounter for screening mammogram for malignant neoplasm of breast: Secondary | ICD-10-CM | POA: Diagnosis not present

## 2017-08-16 DIAGNOSIS — Z780 Asymptomatic menopausal state: Secondary | ICD-10-CM | POA: Diagnosis not present

## 2017-08-26 ENCOUNTER — Telehealth: Payer: Self-pay | Admitting: Cardiology

## 2017-08-26 ENCOUNTER — Ambulatory Visit (INDEPENDENT_AMBULATORY_CARE_PROVIDER_SITE_OTHER): Payer: Medicare Other | Admitting: *Deleted

## 2017-08-26 DIAGNOSIS — I495 Sick sinus syndrome: Secondary | ICD-10-CM

## 2017-08-26 NOTE — Telephone Encounter (Signed)
Spoke with pt and reminded pt of remote transmission that is due today. Pt verbalized understanding.   

## 2017-08-26 NOTE — Progress Notes (Signed)
Remote pacemaker transmission.   

## 2017-08-31 LAB — CUP PACEART REMOTE DEVICE CHECK
Battery Impedance: 533 Ohm
Battery Voltage: 2.77 V
Brady Statistic RV Percent Paced: 13 %
Date Time Interrogation Session: 20181206183223
Implantable Pulse Generator Implant Date: 20151103
Lead Channel Impedance Value: 0 Ohm
Lead Channel Impedance Value: 475 Ohm
Lead Channel Pacing Threshold Amplitude: 1 V
Lead Channel Setting Sensing Sensitivity: 2 mV
MDC IDC LEAD IMPLANT DT: 20080326
MDC IDC LEAD LOCATION: 753860
MDC IDC MSMT BATTERY REMAINING LONGEVITY: 92 mo
MDC IDC MSMT LEADCHNL RV PACING THRESHOLD PULSEWIDTH: 0.4 ms
MDC IDC SET LEADCHNL RV PACING AMPLITUDE: 2.5 V
MDC IDC SET LEADCHNL RV PACING PULSEWIDTH: 0.4 ms

## 2017-09-03 ENCOUNTER — Encounter: Payer: Self-pay | Admitting: Cardiology

## 2017-09-06 ENCOUNTER — Ambulatory Visit (INDEPENDENT_AMBULATORY_CARE_PROVIDER_SITE_OTHER): Payer: Medicare Other | Admitting: Pharmacist

## 2017-09-06 DIAGNOSIS — Z7901 Long term (current) use of anticoagulants: Secondary | ICD-10-CM | POA: Diagnosis not present

## 2017-09-06 DIAGNOSIS — I482 Chronic atrial fibrillation, unspecified: Secondary | ICD-10-CM

## 2017-09-06 DIAGNOSIS — Z952 Presence of prosthetic heart valve: Secondary | ICD-10-CM

## 2017-09-06 LAB — POCT INR: INR: 2.9

## 2017-09-16 ENCOUNTER — Other Ambulatory Visit: Payer: Self-pay | Admitting: Cardiovascular Disease

## 2017-10-01 NOTE — Progress Notes (Signed)
Patient ID: Carrie [name], female   DOB: 07/27/1941, 78 y.o.   MRN: [id number] Patient ID: Carrie [name], female   DOB: 1941-01-16, 77 y.o.   MRN: [id number]    Cardiology Office Note    Date:  10/06/2017   ID:  Carrie [name], DOB May 08, 1941, MRN [id number]  PCP:  Lucianne Lei, MD  Cardiologist:   [name], MD   Chief Complaint  Patient presents with  . Follow-up    pt's husband, Jeneen Rinks, reports a dry cough "that has gone on for some time" (he wonders whether it is r/t a medication) and that she snores "real bad" (wants a sleep study)    History of Present Illness:  Carrie [name] is a 77 y.o. 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 The peak troponin was only 5. Taking warfarin and has stopped clopidogrel.  Both the patient and her husband believe that her breathing has improved since her last appointment.  She does not have leg edema.  She is diligently weighing herself on a daily basis and on her home scale her weight is typically around 129-131 pounds.  Her home scale actually seems to overestimate her weight by 1 pound compared to our office scale.  Her husband states that Mrs. Corporan snores all night long, but that he never notes episodes of apnea.  She denies daytime hypersomnolence.  When eating at home she is very careful about avoiding excessive sodium, but she does enjoy eating out.  When she does go to a restaurant she will always inquire about the lowest sodium item on the  menu.  She specifically denies orthopnea, PND, leg edema, any chest pain or pressure, focal neurological events or recent bleeding problems.  She does have occasional epistaxis.  They do have a humidifier in the den, but not in the upstairs bedroom she uses saline nasal spray.  She has not had dizziness or syncope and is not aware of any palpitations.  She has not had any recorded hypotension.  She checks her blood pressure once a day at home.  Surprisingly, at her 04/21/2017 appointment she was in sinus rhythm, which had not been documented in a long time, but 05/11/2017 her rhythm was clearly irregular again and today she is in atrial fibrillation with intermittent ventricular pacing.  She has a single-chamber Medtronic [place] single chamber pacemaker (implant 2008, generator change 2015) set at lower rate limit of 50 bpm, to try to limit the frequency of ventricular pacing, since this seems to worsen heart failure.  On the current medications and with the current pacemaker settings she only has 14% ventricular pacing.  The heart rate histogram distribution is broadened due to the presence of a regular rhythm but it is very rare that her heart rate exceeds 100 bpm.  I think we have achieve the optimal balance between ventricular pacing and rapid ventricular rates.  Battery longevity is estimated at 7.5 years.  The lead parameters remain very good.  There have been no episodes of recorded high ventricular rate.  In December 2012 she had subarachnoid hemorrhage and in June  2013 she had intrahepatic hemorrhage requiring arterial embolization and we are keeping her INR in the lower range (2.0-2.5). She has normal coronary arteries by previous angiography (2004) normal left ventricular systolic function by echocardiography (Oct 2015).  Last nuclear stress test 04/29/2017:   The left ventricular ejection fraction is hyperdynamic (>65%).  Nuclear stress EF: 67%.  There was no ST segment deviation  noted during stress.  The study is normal.  This is a low risk study.   Normal pharmacologic nuclear stress test with no evidence for prior infarct or ischemia.  Last echo 10/01/2016:  Study Conclusions  - Left ventricle: Systolic function was normal. The estimated ejection fraction was in the range of 50% to 55%. Wall motion was   normal; there were no regional wall motion abnormalities. Mitral valve prosthesis and atrial fibrillation prevents evaluation of   LV diastolic function. - Ventricular septum: Septal motion showed paradox. The contour showed diastolic flattening. These changes are consistent with RV volume overload. - Aortic valve: Right coronary cusp mobility was moderately restricted. There was trivial regurgitation. - Mitral valve: A mechanical prosthesis was present and functioning normally. - Left atrium: The atrium was severely dilated. - Right ventricle: The cavity size was moderately dilated. Systolic function was mildly to moderately reduced. - Right atrium: The atrium was severely dilated. - Atrial septum: No defect or patent foramen ovale was identified. - Tricuspid valve: There was severe regurgitation. - Pulmonary arteries: Systolic pressure was moderately increased. PA peak pressure: 55 mm Hg (S).  Past Medical History:  Diagnosis Date  . Anticoagulated on Coumadin    for mech valve and atrial fib  goal 2.0-2.5  . Anxiety   . Arthritis    "right shoulder" (03/15/2013)  . Atrial fibrillation ([place])   . Atrial flutter ([place])   . CAD (coronary artery disease) 08/11/2016  . CHF (congestive heart failure) ([place])   . Chronic combined systolic and diastolic CHF (congestive heart failure) ([place])    a. 02/2016 Echo: EF 45-50%.  . Diverticula of colon   . Exertional shortness of breath   . GERD (gastroesophageal reflux disease)   . Heart murmur   . Hemorrhage intraabdominal 03/17/2012  . History of blood transfusion    "once" (03/15/2013)  . Hyperlipidemia   .  Hypertension   . Liver hemorrhage   . Migraines   . 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 Bi-leaflet St. Jude mechanical prosthesis; b. 02/2016 Echo: EF 45-50%, some degree of MR, sev dil LA/RA, sev TR.  . NSTEMI (non-ST elevated myocardial infarction) ([place])    a. 02/2016 elev trop/Cath: nonobs dzs,   . Pacemaker    medtronic adapta  . Pneumonia 2009   resolved.? OPD Rx  . Severe tricuspid regurgitation    a. 02/2016 Echo: Ef 45-50%, sev TR, PASP 73mmHg.  . Sick sinus syndrome ([place])    Dr Cristopher Peru. EP study negative. Pacemaker 12/15/06 Medtronic  . Stroke [facility])    "they say I had a stroke last year" denies residual on 03/15/2013  . Subdural hematoma [facility])     Past Surgical History:  Procedure Laterality Date  . APPENDECTOMY    . CARDIAC CATHETERIZATION  04/02/97   R&L:severe MR/pulmonary hypertension  . CARDIAC CATHETERIZATION N/A 03/16/2016   Procedure: Left Heart Cath and Coronary Angiography;  Surgeon: [name], MD;  Location: [place] CV LAB;  Service: Cardiovascular;  Laterality: N/A;  . CARDIAC CATHETERIZATION N/A 09/30/2016   Procedure: Left Heart Cath and Coronary  Angiography;  Surgeon: [name], MD;  Location: [place] CV LAB;  Service: Cardiovascular;  Laterality: N/A;  . CARDIAC CATHETERIZATION N/A 09/30/2016   Procedure: Coronary Stent Intervention;  Surgeon: [name], MD;  3.5 x 12 mm resolute Onyx  to ostial RCA  . CATARACT EXTRACTION W/ INTRAOCULAR LENS  IMPLANT, BILATERAL Bilateral 2013  . CORONARY ANGIOPLASTY    . DILATION AND CURETTAGE OF UTERUS     "had fibroids" (03/15/2013)  . EXPLORATORY LAPAROTOMY     "had a growth on my intestines" (03/15/2013)  . HAMMER TOE SURGERY Right   . INSERT / REPLACE / REMOVE PACEMAKER  12/15/2006   Medtronic  . MITRAL VALVE REPLACEMENT  1998   St Jude mechanical; Dr. [name]  . PACEMAKER PLACEMENT  12/15/06   medtronic adapta for SSS  . PERMANENT PACEMAKER GENERATOR CHANGE N/A  07/24/2014   Procedure: PERMANENT PACEMAKER GENERATOR CHANGE;  Surgeon: [name], MD;  Location: [place] CATH LAB;  Service: Cardiovascular;  Laterality: N/A;  . PERSANTINE CARDIOLITE  08/07/03   mild inf. ischemia   . TEE WITHOUT CARDIOVERSION  09/23/2011   Procedure: TRANSESOPHAGEAL ECHOCARDIOGRAM (TEE);  Surgeon: [name];  Location: [facility] ENDOS[facility];  Service: Cardiovascular;  Laterality: N/A;  . TONSILLECTOMY    . TUBAL LIGATION    . US ECHOCARDIOGRAPHY  11/19/2011   EF 50-55%,RA mod to severely dilated,LA severely dilated,trace MR,small vegetation or mass on the MV,AOV mildly scleroticmild PI, RV pressure 40-61mmHg    Outpatient Medications Prior to Visit  Medication Sig Dispense Refill  . atorvastatin (LIPITOR) 40 MG tablet Take 1 tablet (40 mg total) by mouth daily at 6 PM. (Patient taking differently: Take 40 mg by mouth daily. ) 30 tablet 12  . Biotin 1000 MCG tablet Take 1,000 mcg by mouth daily.    Marland Kitchen CARTIA XT 120 MG 24 hr capsule TAKE ONE CAPSULE BY MOUTH DAILY 90 capsule 2  . docusate sodium (COLACE) 100 MG capsule Take 200 mg by mouth 2 (two) times daily.     . furosemide (LASIX) 40 MG tablet Take 1.5 tablets (60 mg total) by mouth 2 (two) times daily. 135 tablet 3  . metoprolol tartrate (LOPRESSOR) 25 MG tablet Take 1 tablet (25 mg total) by mouth 2 (two) times daily. 180 tablet 3  . Multiple Vitamin (MULTIVITAMINS PO) Take 1 tablet by mouth daily.     . nitroGLYCERIN (NITROSTAT) 0.4 MG SL tablet Place 1 tablet (0.4 mg total) under the tongue every 5 (five) minutes as needed for chest pain. 225 tablet 0  . Omega-3 Fatty Acids (FISH OIL) 1000 MG CAPS Take 1,000 mg by mouth daily.    . polyethylene glycol (MIRALAX / GLYCOLAX) packet Take 17 g by mouth as needed (for constipation). Reported on 03/25/2016    . potassium chloride SA (K-DUR,KLOR-CON) 20 MEQ tablet Take 4 tablets (80 mEq total) by mouth daily. 180 tablet 3  . warfarin (COUMADIN) 5 MG tablet TAKE 1 TO 1 AND 1/2  TABLETS BY MOUTH DAILY AS DIRECTED 120 tablet 1   No facility-administered medications prior to visit.      Allergies:   Codeine   Social History   Socioeconomic History  . Marital status: Married    Spouse name: None  . Number of children: None  . Years of education: None  . Highest education level: None  Social Needs  . Financial resource strain: None  . Food insecurity - worry: None  . Food insecurity - inability: None  .  Transportation needs - medical: None  . Transportation needs - non-medical: None  Occupational History  . None  Tobacco Use  . Smoking status: Never Smoker  . Smokeless tobacco: Never Used  Substance and Sexual Activity  . Alcohol use: No  . Drug use: No  . Sexual activity: No  Other Topics Concern  . None  Social History Narrative  . None     Family History:  The patient's family history includes Cancer in her mother and sister; Diabetes in her sister; Healthy in her brother, brother, brother, and sister; Heart attack in her maternal grandfather; Kidney disease in her daughter; Leukemia in her sister; Stroke in her brother and father.   ROS:   Please see the history of present illness.    ROS All other systems reviewed and are negative.   PHYSICAL EXAM:   VS:  BP 116/62   Pulse 65   Ht 5\' 1"  (1.549 m)   Wt 125 lb 12.8 oz (57.1 kg)   BMI 23.77 kg/m      General: Alert, oriented x3, no distress, looks very comfortable Head: no evidence of trauma, PERRL, EOMI, no exophtalmos or lid lag, no myxedema, no xanthelasma; normal ears, nose and oropharynx Neck: normal jugular venous pulsations and no hepatojugular reflux; brisk carotid pulses without delay and no carotid bruits Chest: clear to auscultation, no signs of consolidation by percussion or palpation, normal fremitus, symmetrical and full respiratory excursions Cardiovascular: normal position and quality of the apical impulse, irregular rhythm, normal first and paradoxically split second  heart sounds, crisp prosthetic valve sounds, 3/6 holosystolic murmur heard on both sides of the lower sternal border no diastolic murmurs, rubs or gallops Abdomen: no tenderness or distention, no masses by palpation, no abnormal pulsatility or arterial bruits, normal bowel sounds, no hepatosplenomegaly.  The liver is nonpulsatile Extremities: no clubbing, cyanosis or edema; 2+ radial, ulnar and brachial pulses bilaterally; 2+ right femoral, posterior tibial and dorsalis pedis pulses; 2+ left femoral, posterior tibial and dorsalis pedis pulses; no subclavian or femoral bruits Neurological: grossly nonfocal Psych: Normal mood and affect    Wt Readings from Last 3 Encounters:  10/06/17 125 lb 12.8 oz (57.1 kg)  05/11/17 129 lb (58.5 kg)  04/28/17 131 lb (59.4 kg)      Studies/Labs Reviewed:   EKG:  EKG is ordered today.  Shows atrial fibrillation with several ventricular paced beats and an overall rate of 65 bpm.  During native AV conduction there is a nonspecific intraventricular conduction delay with a total care restoration of about 140 ms.  The QTC is 470 ms  Recent Labs: 12/17/2016: HGB 9.6; Platelets 158 05/11/2017: BNP 1,295.5; Magnesium 2.5 07/15/2017: BUN 29; Creatinine, Ser 1.04; Potassium 4.9; Sodium 139   Lipid Panel     Component Value Date/Time   CHOL 144 03/12/2016 0430   TRIG 35 03/12/2016 0430   HDL 49 03/12/2016 0430   CHOLHDL 2.9 03/12/2016 0430   VLDL 7 03/12/2016 0430   LDLCALC 88 03/12/2016 0430     ASSESSMENT:    1. Chronic diastolic heart failure ([place])   2. Coronary artery disease involving native coronary artery of native heart without angina pectoris   3. PAH (pulmonary artery hypertension) (H[facility])   4. Persistent atrial fibrillation ([place])   5. Pacemaker - single chamber Medtronic Adapta, 2008   6. S/P mitral valve replacement, St Jude   7. Moderate to severe tricuspid regurgitation   8. Essential hypertension   9. Chronic anticoagulation, ( INR  goal  2.0-2.5 due to history of subdural hematoma and liver hemorrhage)      PLAN:  In order of problems listed above:  1. CHF: Today she seems euvolemic.  It seems that she does well as long as her weight is less than 132 pounds on her home scale. NYHA functional class II. Weight is even lower than at her previous appointment. Note the fact that she has moderate pulmonary artery hypertension with normal left ventricular end-diastolic pressure (EDP 13 at cath). She probably has fixed pulmonary hypertension related to previous mitral valve disease. Clinical and echo findings are of right heart failure, not left heart failure.  Asked her to call our office if she has a weight of 132 pounds or greater for 2 consecutive days so we can help adjust her diuretics.  She is on a moderate to high dose of loop diuretics already.  She is actually taking 80 mg in the morning and 60 mg in the evening of furosemide.  Her cough does not appear to be associated with supine position and is nonproductive.  I do not think her cough is related to heart failure. 2. CAD s/p NSTEMI and DES-RCA: A year has passed since her procedure she is no longer taking antiplatelet agents. She does not have any angina. She is on a highly active statin. Target LDL< 70.  3. PAH: Normal LVEDP, normally functioning mitral valve prosthesis. She may have some degree of fixed pulmonary hypertension related to previous mitral valve disease. No clear symptoms of obstructive sleep apnea. There is no history of pulmonary embolism or COPD.  It is therefore expected that she will have residual dyspnea even when perfectly euvolemic 4. AFib: previously estimated to have permanent atrial fibrillation, surprisingly at her August appointment had normal sinus rhythm, but now back in atrial fibrillation. The change in rhythm doesn't seem to be associated with any particular change hemodynamically. Antiarrhythmics do not appear to be justified.  On her current medical  regimen and with the current pacemaker settings with seem to have struck the right balance between avoiding high ventricular rate and avoiding excessive ventricular pacing.  No changes made to the medicines.  She definitely appears to do better clinically when she has less right ventricular pacing. 5. PPM: Normal device function.  Remote download in 3 months, office visit in 6 months 6. Mechanical mitral valve replacement: She has a large diameter valve that had normal function by her echocardiogram.  It is unlikely that she will develop a major increase in transvalvular gradients even when her heart rate is a little faster 7. Moderate to severe TR is the major cause of her edema and the jugular venous distention. She may have some degree of rheumatic valve disease of the tricuspid valve The most important cause is likely to be moderate pulmonary artery hypertension. The presence of a transvalvular pacemaker lead, mechanically preventing the leaflet motion may also be contributory. Physical exam is compatible with severe tricuspid regurgitation. I don't think she would be a great candidate for repeat surgery at age 63, but this opportunity might need to be taken if there is evidence of serious hemodynamic complications or liver abnormalities.  8. HTN: After the most recent changes to her medication she has not had problems with hypotension; blood pressure today is in good range. 9. Anticoagulation with previous severe bleeding complications in her liver and brain, continue keeping INR very tightly in the 2.0-2.5 range.  Medication Adjustments/Labs and Tests Ordered: Current medicines are reviewed  at length with the patient today.  Concerns regarding medicines are outlined above.  Medication changes, Labs and Tests ordered today are listed in the Patient Instructions below. Patient Instructions  Dr Sallyanne Kuster recommends that you continue on your current medications as directed. Please refer to the Current  Medication list given to you today.  Remote monitoring is used to monitor your Pacemaker or ICD from home. This monitoring reduces the number of office visits required to check your device to one time per year. It allows [place] to keep an eye on the functioning of your device to ensure it is working properly. You are scheduled for a device check from home on Thursday, March 7th, 2019. You may send your transmission at any time that day. If you have a wireless device, the transmission will be sent automatically. After your physician reviews your transmission, you will receive a postcard with your next transmission date.  Dr Sallyanne Kuster recommends that you schedule a follow-up appointment in 6 months with a pacemaker check. You will receive a reminder letter in the mail two months in advance. If you don't receive a letter, please call our office to schedule the follow-up appointment.  If you need a refill on your cardiac medications before your next appointment, please call your pharmacy.      Signed, [name], MD  10/06/2017 1:41 PM    [facility] [address], [place], [place]  [address] Phone: [phone]; Fax: [phone]

## 2017-10-06 ENCOUNTER — Ambulatory Visit (INDEPENDENT_AMBULATORY_CARE_PROVIDER_SITE_OTHER): Payer: Medicare Other | Admitting: Cardiovascular Disease

## 2017-10-06 ENCOUNTER — Encounter: Payer: Self-pay | Admitting: Cardiovascular Disease

## 2017-10-06 ENCOUNTER — Ambulatory Visit (INDEPENDENT_AMBULATORY_CARE_PROVIDER_SITE_OTHER): Payer: Medicare Other | Admitting: Pharmacist Clinician (PhC)/ Clinical Pharmacy Specialist

## 2017-10-06 VITALS — BP 116/62 | HR 65 | Ht 61.0 in | Wt 125.8 lb

## 2017-10-06 DIAGNOSIS — I10 Essential (primary) hypertension: Secondary | ICD-10-CM | POA: Diagnosis not present

## 2017-10-06 DIAGNOSIS — I482 Chronic atrial fibrillation, unspecified: Secondary | ICD-10-CM

## 2017-10-06 DIAGNOSIS — I4819 Other persistent atrial fibrillation: Secondary | ICD-10-CM

## 2017-10-06 DIAGNOSIS — Z952 Presence of prosthetic heart valve: Secondary | ICD-10-CM

## 2017-10-06 DIAGNOSIS — Z950 Presence of cardiac pacemaker: Secondary | ICD-10-CM

## 2017-10-06 DIAGNOSIS — I2510 Atherosclerotic heart disease of native coronary artery without angina pectoris: Secondary | ICD-10-CM

## 2017-10-06 DIAGNOSIS — Z7901 Long term (current) use of anticoagulants: Secondary | ICD-10-CM | POA: Diagnosis not present

## 2017-10-06 DIAGNOSIS — I5032 Chronic diastolic (congestive) heart failure: Secondary | ICD-10-CM | POA: Diagnosis not present

## 2017-10-06 DIAGNOSIS — I481 Persistent atrial fibrillation: Secondary | ICD-10-CM | POA: Diagnosis not present

## 2017-10-06 DIAGNOSIS — I2721 Secondary pulmonary arterial hypertension: Secondary | ICD-10-CM

## 2017-10-06 DIAGNOSIS — I071 Rheumatic tricuspid insufficiency: Secondary | ICD-10-CM | POA: Diagnosis not present

## 2017-10-06 LAB — POCT INR: INR: 2.6

## 2017-10-06 NOTE — Patient Instructions (Signed)
Description   Continue taking 1 tablet daily, except 1.5 tablets each Monday and Friday. Recheck INR in  4 weeks.

## 2017-10-06 NOTE — Patient Instructions (Signed)
Dr Sallyanne Kuster recommends that you continue on your current medications as directed. Please refer to the Current Medication list given to you today.  Remote monitoring is used to monitor your Pacemaker or ICD from home. This monitoring reduces the number of office visits required to check your device to one time per year. It allows [place] to keep an eye on the functioning of your device to ensure it is working properly. You are scheduled for a device check from home on Thursday, March 7th, 2019. You may send your transmission at any time that day. If you have a wireless device, the transmission will be sent automatically. After your physician reviews your transmission, you will receive a postcard with your next transmission date.  Dr Sallyanne Kuster recommends that you schedule a follow-up appointment in 6 months with a pacemaker check. You will receive a reminder letter in the mail two months in advance. If you don't receive a letter, please call our office to schedule the follow-up appointment.  If you need a refill on your cardiac medications before your next appointment, please call your pharmacy.

## 2017-10-13 ENCOUNTER — Other Ambulatory Visit: Payer: Self-pay

## 2017-10-13 MED ORDER — ATORVASTATIN CALCIUM 40 MG PO TABS: 40.0000 mg | ORAL_TABLET | Freq: Every day | ORAL | 12 refills | Status: DC

## 2017-11-01 ENCOUNTER — Telehealth: Payer: Self-pay | Admitting: Cardiovascular Disease

## 2017-11-01 ENCOUNTER — Other Ambulatory Visit: Payer: Self-pay | Admitting: Cardiovascular Disease

## 2017-11-01 LAB — CUP PACEART INCLINIC DEVICE CHECK
Date Time Interrogation Session: 20190211181459
Implantable Lead Location: 753860
Lead Channel Setting Pacing Pulse Width: 0.4 ms
Lead Channel Setting Sensing Sensitivity: 2 mV
MDC IDC LEAD IMPLANT DT: 20080326
MDC IDC PG IMPLANT DT: 20151103
MDC IDC SET LEADCHNL RV PACING AMPLITUDE: 2.5 V

## 2017-11-01 MED ORDER — POTASSIUM CHLORIDE CRYS ER 20 MEQ PO TBCR: 80.0000 meq | EXTENDED_RELEASE_TABLET | Freq: Every day | ORAL | 11 refills | Status: DC

## 2017-11-01 NOTE — Telephone Encounter (Signed)
New Message    *STAT* If patient is at the pharmacy, call can be transferred to refill team.   1. Which medications need to be refilled? (please list name of each medication and dose if known)  potassium chloride SA (K-DUR,KLOR-CON) 20 MEQ tablet  2. Which pharmacy/location (including street and city if local pharmacy) is medication to be sent to? [place]   3. Do they need a 30 day or 90 day supply? [place]

## 2017-11-01 NOTE — Telephone Encounter (Signed)
REFILL 

## 2017-11-03 ENCOUNTER — Ambulatory Visit (INDEPENDENT_AMBULATORY_CARE_PROVIDER_SITE_OTHER): Payer: Medicare Other | Admitting: Pharmacist Clinician (PhC)/ Clinical Pharmacy Specialist

## 2017-11-03 DIAGNOSIS — I482 Chronic atrial fibrillation, unspecified: Secondary | ICD-10-CM

## 2017-11-03 DIAGNOSIS — Z7901 Long term (current) use of anticoagulants: Secondary | ICD-10-CM | POA: Diagnosis not present

## 2017-11-03 DIAGNOSIS — Z952 Presence of prosthetic heart valve: Secondary | ICD-10-CM | POA: Diagnosis not present

## 2017-11-03 LAB — POCT INR: INR: 2.7

## 2017-11-03 NOTE — Patient Instructions (Signed)
Description   Take only 1/2 tablet today Wednesday Feb 13, then continue taking 1 tablet daily, except 1.5 tablets each Monday and Friday. Recheck INR in  4 weeks.

## 2017-11-25 ENCOUNTER — Ambulatory Visit (INDEPENDENT_AMBULATORY_CARE_PROVIDER_SITE_OTHER): Payer: Medicare Other | Admitting: *Deleted

## 2017-11-25 DIAGNOSIS — I495 Sick sinus syndrome: Secondary | ICD-10-CM

## 2017-11-25 NOTE — Progress Notes (Signed)
Remote pacemaker transmission.   

## 2017-11-26 ENCOUNTER — Encounter: Payer: Self-pay | Admitting: Cardiology

## 2017-12-01 ENCOUNTER — Ambulatory Visit (INDEPENDENT_AMBULATORY_CARE_PROVIDER_SITE_OTHER): Payer: Medicare Other | Admitting: Pharmacist Clinician (PhC)/ Clinical Pharmacy Specialist

## 2017-12-01 DIAGNOSIS — I482 Chronic atrial fibrillation, unspecified: Secondary | ICD-10-CM

## 2017-12-01 DIAGNOSIS — Z952 Presence of prosthetic heart valve: Secondary | ICD-10-CM

## 2017-12-01 DIAGNOSIS — Z7901 Long term (current) use of anticoagulants: Secondary | ICD-10-CM

## 2017-12-01 LAB — POCT INR: INR: 3.2

## 2017-12-04 LAB — CUP PACEART REMOTE DEVICE CHECK
Battery Impedance: 636 Ohm
Battery Remaining Longevity: 84 mo
Battery Voltage: 2.77 V
Date Time Interrogation Session: 20190307161650
Implantable Lead Implant Date: 20080326
Implantable Lead Location: 753860
Implantable Lead Model: 4076
Implantable Pulse Generator Implant Date: 20151103
Lead Channel Pacing Threshold Pulse Width: 0.4 ms
Lead Channel Setting Sensing Sensitivity: 2 mV
MDC IDC MSMT LEADCHNL RA IMPEDANCE VALUE: 0 Ohm
MDC IDC MSMT LEADCHNL RV IMPEDANCE VALUE: 488 Ohm
MDC IDC MSMT LEADCHNL RV PACING THRESHOLD AMPLITUDE: 1 V
MDC IDC SET LEADCHNL RV PACING AMPLITUDE: 2.5 V
MDC IDC SET LEADCHNL RV PACING PULSEWIDTH: 0.4 ms
MDC IDC STAT BRADY RV PERCENT PACED: 20 %

## 2017-12-22 ENCOUNTER — Ambulatory Visit (INDEPENDENT_AMBULATORY_CARE_PROVIDER_SITE_OTHER): Payer: Medicare Other | Admitting: Pharmacist Clinician (PhC)/ Clinical Pharmacy Specialist

## 2017-12-22 DIAGNOSIS — I482 Chronic atrial fibrillation, unspecified: Secondary | ICD-10-CM

## 2017-12-22 DIAGNOSIS — Z7901 Long term (current) use of anticoagulants: Secondary | ICD-10-CM | POA: Diagnosis not present

## 2017-12-22 DIAGNOSIS — Z952 Presence of prosthetic heart valve: Secondary | ICD-10-CM

## 2017-12-22 LAB — POCT INR: INR: 2.4

## 2017-12-22 NOTE — Patient Instructions (Signed)
Description   Continue with 1 tablet daily, except 1.5 tablets each Monday. Recheck INR in 4 weeks.

## 2017-12-27 ENCOUNTER — Other Ambulatory Visit: Payer: Self-pay | Admitting: Cardiovascular Disease

## 2018-01-03 ENCOUNTER — Telehealth: Payer: Self-pay | Admitting: Pharmacist Clinician (PhC)/ Clinical Pharmacy Specialist

## 2018-01-03 NOTE — Telephone Encounter (Signed)
Passed blood in urine Saturday night.  Repeated Sunday for 2-3 times.  Has been fine since.  No complaints of back or abdominal pain, no fever.  Held warfarin dose on Sunday.    Advised that should it happen again she needs to contact her PCP for urinalysis.  Also contact them for any signs of fever or burning.   Patient voiced understanding.

## 2018-01-04 DIAGNOSIS — H02536 Eyelid retraction left eye, unspecified eyelid: Secondary | ICD-10-CM | POA: Diagnosis not present

## 2018-01-04 DIAGNOSIS — E873 Alkalosis: Secondary | ICD-10-CM | POA: Diagnosis not present

## 2018-01-04 DIAGNOSIS — N3001 Acute cystitis with hematuria: Secondary | ICD-10-CM | POA: Diagnosis not present

## 2018-01-04 DIAGNOSIS — I110 Hypertensive heart disease with heart failure: Secondary | ICD-10-CM | POA: Diagnosis not present

## 2018-01-04 DIAGNOSIS — Z7901 Long term (current) use of anticoagulants: Secondary | ICD-10-CM | POA: Diagnosis not present

## 2018-01-04 DIAGNOSIS — D508 Other iron deficiency anemias: Secondary | ICD-10-CM | POA: Diagnosis not present

## 2018-01-04 DIAGNOSIS — I480 Paroxysmal atrial fibrillation: Secondary | ICD-10-CM | POA: Diagnosis not present

## 2018-01-04 DIAGNOSIS — I10 Essential (primary) hypertension: Secondary | ICD-10-CM | POA: Diagnosis not present

## 2018-01-05 DIAGNOSIS — N390 Urinary tract infection, site not specified: Secondary | ICD-10-CM | POA: Diagnosis not present

## 2018-01-12 DIAGNOSIS — H04123 Dry eye syndrome of bilateral lacrimal glands: Secondary | ICD-10-CM | POA: Diagnosis not present

## 2018-01-12 DIAGNOSIS — Z961 Presence of intraocular lens: Secondary | ICD-10-CM | POA: Diagnosis not present

## 2018-01-19 ENCOUNTER — Ambulatory Visit (INDEPENDENT_AMBULATORY_CARE_PROVIDER_SITE_OTHER): Payer: Medicare Other | Admitting: Pharmacist

## 2018-01-19 DIAGNOSIS — I482 Chronic atrial fibrillation, unspecified: Secondary | ICD-10-CM

## 2018-01-19 DIAGNOSIS — N390 Urinary tract infection, site not specified: Secondary | ICD-10-CM | POA: Diagnosis not present

## 2018-01-19 DIAGNOSIS — Z7901 Long term (current) use of anticoagulants: Secondary | ICD-10-CM | POA: Diagnosis not present

## 2018-01-19 DIAGNOSIS — Z952 Presence of prosthetic heart valve: Secondary | ICD-10-CM | POA: Diagnosis not present

## 2018-01-19 LAB — POCT INR: INR: 1.7

## 2018-02-07 DIAGNOSIS — N390 Urinary tract infection, site not specified: Secondary | ICD-10-CM | POA: Diagnosis not present

## 2018-02-16 ENCOUNTER — Ambulatory Visit (INDEPENDENT_AMBULATORY_CARE_PROVIDER_SITE_OTHER): Payer: Medicare Other | Admitting: Pharmacist Clinician (PhC)/ Clinical Pharmacy Specialist

## 2018-02-16 DIAGNOSIS — I482 Chronic atrial fibrillation, unspecified: Secondary | ICD-10-CM

## 2018-02-16 DIAGNOSIS — Z952 Presence of prosthetic heart valve: Secondary | ICD-10-CM | POA: Diagnosis not present

## 2018-02-16 DIAGNOSIS — Z7901 Long term (current) use of anticoagulants: Secondary | ICD-10-CM | POA: Diagnosis not present

## 2018-02-16 LAB — POCT INR: INR: 2.8 (ref 2.0–3.0)

## 2018-02-16 NOTE — Patient Instructions (Signed)
Description   Continue with 1 tablet daily, except 1.5 tablets each Monday. Recheck INR in 4 weeks.

## 2018-02-24 ENCOUNTER — Ambulatory Visit (INDEPENDENT_AMBULATORY_CARE_PROVIDER_SITE_OTHER): Payer: Medicare Other | Admitting: *Deleted

## 2018-02-24 DIAGNOSIS — I495 Sick sinus syndrome: Secondary | ICD-10-CM

## 2018-02-24 DIAGNOSIS — I4819 Other persistent atrial fibrillation: Secondary | ICD-10-CM

## 2018-02-24 NOTE — Progress Notes (Signed)
Remote pacemaker transmission.   

## 2018-03-01 ENCOUNTER — Other Ambulatory Visit: Payer: Self-pay | Admitting: Cardiovascular Disease

## 2018-03-16 ENCOUNTER — Ambulatory Visit (INDEPENDENT_AMBULATORY_CARE_PROVIDER_SITE_OTHER): Payer: Medicare Other | Admitting: Pharmacist Clinician (PhC)/ Clinical Pharmacy Specialist

## 2018-03-16 DIAGNOSIS — I482 Chronic atrial fibrillation, unspecified: Secondary | ICD-10-CM

## 2018-03-16 DIAGNOSIS — Z7901 Long term (current) use of anticoagulants: Secondary | ICD-10-CM | POA: Diagnosis not present

## 2018-03-16 DIAGNOSIS — Z952 Presence of prosthetic heart valve: Secondary | ICD-10-CM

## 2018-03-16 LAB — POCT INR: INR: 3 (ref 2.0–3.0)

## 2018-03-16 NOTE — Patient Instructions (Signed)
Description   Take 1/2 tablet today Wednesday June 26, then continue with 1 tablet daily, except 1.5 tablets each Monday. Recheck INR in 4 weeks.

## 2018-03-28 ENCOUNTER — Other Ambulatory Visit: Payer: Self-pay | Admitting: Family Medicine

## 2018-03-28 ENCOUNTER — Ambulatory Visit
Admission: RE | Admit: 2018-03-28 | Discharge: 2018-03-28 | Disposition: A | Discharge status: Home / Self Care | Payer: Medicare Other | Source: Ambulatory Visit | Attending: Family Medicine | Admitting: Family Medicine

## 2018-03-28 DIAGNOSIS — J9801 Acute bronchospasm: Secondary | ICD-10-CM

## 2018-03-28 DIAGNOSIS — Z6824 Body mass index (BMI) 24.0-24.9, adult: Secondary | ICD-10-CM | POA: Diagnosis not present

## 2018-03-28 DIAGNOSIS — J188 Other pneumonia, unspecified organism: Secondary | ICD-10-CM

## 2018-03-28 DIAGNOSIS — I110 Hypertensive heart disease with heart failure: Secondary | ICD-10-CM | POA: Diagnosis not present

## 2018-03-28 DIAGNOSIS — J181 Lobar pneumonia, unspecified organism: Secondary | ICD-10-CM | POA: Diagnosis not present

## 2018-03-30 ENCOUNTER — Other Ambulatory Visit: Payer: Self-pay

## 2018-03-30 ENCOUNTER — Emergency Department (HOSPITAL_COMMUNITY): Payer: Medicare Other

## 2018-03-30 ENCOUNTER — Encounter (HOSPITAL_COMMUNITY): Payer: Self-pay | Admitting: *Deleted

## 2018-03-30 ENCOUNTER — Inpatient Hospital Stay (HOSPITAL_COMMUNITY)
Admission: EM | Admit: 2018-03-30 | Discharge: 2018-04-01 | DRG: 291 | Disposition: A | Discharge status: Home / Self Care | Payer: Medicare Other | Attending: Internal Medicine | Admitting: Internal Medicine

## 2018-03-30 DIAGNOSIS — I2721 Secondary pulmonary arterial hypertension: Secondary | ICD-10-CM | POA: Diagnosis not present

## 2018-03-30 DIAGNOSIS — R29818 Other symptoms and signs involving the nervous system: Secondary | ICD-10-CM | POA: Diagnosis not present

## 2018-03-30 DIAGNOSIS — I10 Essential (primary) hypertension: Secondary | ICD-10-CM | POA: Diagnosis present

## 2018-03-30 DIAGNOSIS — R05 Cough: Secondary | ICD-10-CM

## 2018-03-30 DIAGNOSIS — N183 Chronic kidney disease, stage 3 unspecified: Secondary | ICD-10-CM | POA: Diagnosis present

## 2018-03-30 DIAGNOSIS — I083 Combined rheumatic disorders of mitral, aortic and tricuspid valves: Secondary | ICD-10-CM | POA: Diagnosis not present

## 2018-03-30 DIAGNOSIS — I130 Hypertensive heart and chronic kidney disease with heart failure and stage 1 through stage 4 chronic kidney disease, or unspecified chronic kidney disease: Secondary | ICD-10-CM | POA: Diagnosis not present

## 2018-03-30 DIAGNOSIS — I5043 Acute on chronic combined systolic (congestive) and diastolic (congestive) heart failure: Secondary | ICD-10-CM

## 2018-03-30 DIAGNOSIS — Z950 Presence of cardiac pacemaker: Secondary | ICD-10-CM | POA: Diagnosis not present

## 2018-03-30 DIAGNOSIS — H547 Unspecified visual loss: Secondary | ICD-10-CM | POA: Diagnosis not present

## 2018-03-30 DIAGNOSIS — R778 Other specified abnormalities of plasma proteins: Secondary | ICD-10-CM | POA: Diagnosis present

## 2018-03-30 DIAGNOSIS — Z8673 Personal history of transient ischemic attack (TIA), and cerebral infarction without residual deficits: Secondary | ICD-10-CM | POA: Diagnosis not present

## 2018-03-30 DIAGNOSIS — R7989 Other specified abnormal findings of blood chemistry: Secondary | ICD-10-CM | POA: Diagnosis present

## 2018-03-30 DIAGNOSIS — I481 Persistent atrial fibrillation: Secondary | ICD-10-CM | POA: Diagnosis not present

## 2018-03-30 DIAGNOSIS — J90 Pleural effusion, not elsewhere classified: Secondary | ICD-10-CM | POA: Diagnosis not present

## 2018-03-30 DIAGNOSIS — R51 Headache: Secondary | ICD-10-CM | POA: Diagnosis not present

## 2018-03-30 DIAGNOSIS — Z952 Presence of prosthetic heart valve: Secondary | ICD-10-CM

## 2018-03-30 DIAGNOSIS — Z7901 Long term (current) use of anticoagulants: Secondary | ICD-10-CM

## 2018-03-30 DIAGNOSIS — I4811 Longstanding persistent atrial fibrillation: Secondary | ICD-10-CM | POA: Diagnosis present

## 2018-03-30 DIAGNOSIS — J189 Pneumonia, unspecified organism: Secondary | ICD-10-CM | POA: Diagnosis not present

## 2018-03-30 DIAGNOSIS — I2510 Atherosclerotic heart disease of native coronary artery without angina pectoris: Secondary | ICD-10-CM | POA: Diagnosis not present

## 2018-03-30 DIAGNOSIS — J209 Acute bronchitis, unspecified: Secondary | ICD-10-CM | POA: Diagnosis present

## 2018-03-30 DIAGNOSIS — Z885 Allergy status to narcotic agent status: Secondary | ICD-10-CM

## 2018-03-30 DIAGNOSIS — R0602 Shortness of breath: Secondary | ICD-10-CM | POA: Diagnosis not present

## 2018-03-30 DIAGNOSIS — R945 Abnormal results of liver function studies: Secondary | ICD-10-CM | POA: Diagnosis not present

## 2018-03-30 DIAGNOSIS — I110 Hypertensive heart disease with heart failure: Secondary | ICD-10-CM | POA: Diagnosis not present

## 2018-03-30 DIAGNOSIS — Z955 Presence of coronary angioplasty implant and graft: Secondary | ICD-10-CM | POA: Diagnosis not present

## 2018-03-30 DIAGNOSIS — H538 Other visual disturbances: Secondary | ICD-10-CM | POA: Diagnosis present

## 2018-03-30 DIAGNOSIS — I5032 Chronic diastolic (congestive) heart failure: Secondary | ICD-10-CM | POA: Diagnosis present

## 2018-03-30 DIAGNOSIS — R059 Cough, unspecified: Secondary | ICD-10-CM

## 2018-03-30 DIAGNOSIS — Z8249 Family history of ischemic heart disease and other diseases of the circulatory system: Secondary | ICD-10-CM | POA: Diagnosis not present

## 2018-03-30 DIAGNOSIS — I252 Old myocardial infarction: Secondary | ICD-10-CM

## 2018-03-30 DIAGNOSIS — I5033 Acute on chronic diastolic (congestive) heart failure: Secondary | ICD-10-CM | POA: Diagnosis not present

## 2018-03-30 DIAGNOSIS — E785 Hyperlipidemia, unspecified: Secondary | ICD-10-CM | POA: Diagnosis present

## 2018-03-30 DIAGNOSIS — K219 Gastro-esophageal reflux disease without esophagitis: Secondary | ICD-10-CM | POA: Diagnosis present

## 2018-03-30 DIAGNOSIS — R042 Hemoptysis: Secondary | ICD-10-CM | POA: Diagnosis not present

## 2018-03-30 DIAGNOSIS — R197 Diarrhea, unspecified: Secondary | ICD-10-CM | POA: Diagnosis not present

## 2018-03-30 DIAGNOSIS — R748 Abnormal levels of other serum enzymes: Secondary | ICD-10-CM | POA: Diagnosis not present

## 2018-03-30 DIAGNOSIS — Z79899 Other long term (current) drug therapy: Secondary | ICD-10-CM | POA: Diagnosis not present

## 2018-03-30 DIAGNOSIS — I248 Other forms of acute ischemic heart disease: Secondary | ICD-10-CM | POA: Diagnosis present

## 2018-03-30 DIAGNOSIS — J181 Lobar pneumonia, unspecified organism: Secondary | ICD-10-CM

## 2018-03-30 DIAGNOSIS — I361 Nonrheumatic tricuspid (valve) insufficiency: Secondary | ICD-10-CM | POA: Diagnosis not present

## 2018-03-30 LAB — BASIC METABOLIC PANEL
Anion gap: 10 (ref 5–15)
BUN: 19 mg/dL (ref 8–23)
CO2: 21 mmol/L — ABNORMAL LOW (ref 22–32)
Calcium: 9.5 mg/dL (ref 8.9–10.3)
Chloride: 106 mmol/L (ref 98–111)
Creatinine, Ser: 1.05 mg/dL — ABNORMAL HIGH (ref 0.44–1.00)
GFR calc non Af Amer: 50 mL/min — ABNORMAL LOW (ref >60)
GFR, EST AFRICAN AMERICAN: 58 mL/min — AB (ref >60)
Glucose, Bld: 93 mg/dL (ref 70–99)
Potassium: 4.6 mmol/L (ref 3.5–5.1)
Sodium: 137 mmol/L (ref 135–145)

## 2018-03-30 LAB — CBC
HCT: 28.1 % — ABNORMAL LOW (ref 36.0–46.0)
Hemoglobin: 9 g/dL — ABNORMAL LOW (ref 12.0–15.0)
MCH: 29.3 pg (ref 26.0–34.0)
MCHC: 32 g/dL (ref 30.0–36.0)
MCV: 91.5 fL (ref 78.0–100.0)
PLATELETS: 212 10*3/uL (ref 150–400)
RBC: 3.07 MIL/uL — ABNORMAL LOW (ref 3.87–5.11)
RDW: 34 % — AB (ref 11.5–15.5)
WBC: 4.7 10*3/uL (ref 4.0–10.5)

## 2018-03-30 LAB — PROTIME-INR
INR: 2.8
PROTHROMBIN TIME: 29.3 s — AB (ref 11.4–15.2)

## 2018-03-30 LAB — BRAIN NATRIURETIC PEPTIDE: B Natriuretic Peptide: 1780.8 pg/mL — ABNORMAL HIGH (ref 0.0–100.0)

## 2018-03-30 LAB — I-STAT CG4 LACTIC ACID, ED
LACTIC ACID, VENOUS: 0.88 mmol/L (ref 0.5–1.9)
Lactic Acid, Venous: 1.31 mmol/L (ref 0.5–1.9)

## 2018-03-30 LAB — HEPATIC FUNCTION PANEL
ALK PHOS: 117 U/L (ref 38–126)
ALT: 28 U/L (ref 0–44)
AST: 137 U/L — ABNORMAL HIGH (ref 15–41)
Albumin: 3 g/dL — ABNORMAL LOW (ref 3.5–5.0)
BILIRUBIN DIRECT: 0.9 mg/dL — AB (ref 0.0–0.2)
BILIRUBIN INDIRECT: 0.8 mg/dL (ref 0.3–0.9)
BILIRUBIN TOTAL: 1.7 mg/dL — AB (ref 0.3–1.2)
Total Protein: 7.2 g/dL (ref 6.5–8.1)

## 2018-03-30 LAB — TROPONIN I
TROPONIN I: 0.03 ng/mL — AB (ref <0.03)
Troponin I: 0.03 ng/mL (ref <0.03)

## 2018-03-30 MED ORDER — METOPROLOL TARTRATE 25 MG PO TABS
25.0000 mg | ORAL_TABLET | Freq: Two times a day (BID) | ORAL | Status: DC
  Administered 2018-03-31 – 2018-04-01 (×4): 25 mg via ORAL
  Filled 2018-03-30 (×4): qty 1

## 2018-03-30 MED ORDER — ACETAMINOPHEN 325 MG PO TABS: 650.0000 mg | ORAL_TABLET | ORAL | Status: DC | PRN

## 2018-03-30 MED ORDER — ATORVASTATIN CALCIUM 40 MG PO TABS
40.0000 mg | ORAL_TABLET | Freq: Every day | ORAL | Status: DC
  Administered 2018-03-31: 40 mg via ORAL
  Filled 2018-03-30: qty 1

## 2018-03-30 MED ORDER — FUROSEMIDE 10 MG/ML IJ SOLN
40.0000 mg | Freq: Two times a day (BID) | INTRAMUSCULAR | Status: DC
  Administered 2018-03-31: 40 mg via INTRAVENOUS
  Filled 2018-03-30: qty 4

## 2018-03-30 MED ORDER — FUROSEMIDE 10 MG/ML IJ SOLN
40.0000 mg | Freq: Once | INTRAMUSCULAR | Status: AC
  Administered 2018-03-30: 40 mg via INTRAVENOUS
  Filled 2018-03-30: qty 4

## 2018-03-30 MED ORDER — SODIUM CHLORIDE 0.9 % IV SOLN
500.0000 mg | Freq: Once | INTRAVENOUS | Status: AC
  Administered 2018-03-30: 500 mg via INTRAVENOUS
  Filled 2018-03-30: qty 500

## 2018-03-30 MED ORDER — DM-GUAIFENESIN ER 30-600 MG PO TB12: 1.0000 | ORAL_TABLET | Freq: Two times a day (BID) | ORAL | Status: DC | PRN

## 2018-03-30 MED ORDER — OMEGA-3-ACID ETHYL ESTERS 1 G PO CAPS
1.0000 g | ORAL_CAPSULE | Freq: Every day | ORAL | Status: DC
Start: 2018-03-31 — End: 2018-04-01
  Administered 2018-03-31 – 2018-04-01 (×2): 1 g via ORAL
  Filled 2018-03-30 (×2): qty 1

## 2018-03-30 MED ORDER — IOHEXOL 300 MG/ML  SOLN
75.0000 mL | Freq: Once | INTRAMUSCULAR | Status: AC | PRN
  Administered 2018-03-30: 75 mL via INTRAVENOUS

## 2018-03-30 MED ORDER — NITROGLYCERIN 0.4 MG SL SUBL: 0.4000 mg | SUBLINGUAL_TABLET | SUBLINGUAL | Status: DC | PRN

## 2018-03-30 MED ORDER — MORPHINE SULFATE (PF) 4 MG/ML IV SOLN: 0.5000 mg | INTRAVENOUS | Status: DC | PRN

## 2018-03-30 MED ORDER — ZOLPIDEM TARTRATE 5 MG PO TABS: 5.0000 mg | ORAL_TABLET | Freq: Every evening | ORAL | Status: DC | PRN

## 2018-03-30 MED ORDER — SODIUM CHLORIDE 0.9% FLUSH
3.0000 mL | Freq: Two times a day (BID) | INTRAVENOUS | Status: DC
  Administered 2018-03-31 – 2018-04-01 (×4): 3 mL via INTRAVENOUS

## 2018-03-30 MED ORDER — ALBUTEROL SULFATE (2.5 MG/3ML) 0.083% IN NEBU
2.5000 mg | INHALATION_SOLUTION | RESPIRATORY_TRACT | Status: DC | PRN
Start: 2018-03-30 — End: 2018-04-01

## 2018-03-30 MED ORDER — SODIUM CHLORIDE 0.9 % IV SOLN: 250.0000 mL | INTRAVENOUS | Status: DC | PRN

## 2018-03-30 MED ORDER — SODIUM CHLORIDE 0.9 % IV SOLN
1.0000 g | Freq: Once | INTRAVENOUS | Status: AC
  Administered 2018-03-30: 1 g via INTRAVENOUS
  Filled 2018-03-30: qty 10

## 2018-03-30 MED ORDER — DOCUSATE SODIUM 100 MG PO CAPS
200.0000 mg | ORAL_CAPSULE | Freq: Two times a day (BID) | ORAL | Status: DC
  Administered 2018-03-31 – 2018-04-01 (×3): 200 mg via ORAL
  Filled 2018-03-30 (×3): qty 2

## 2018-03-30 MED ORDER — BIOTIN 1000 MCG PO TABS: 1000.0000 ug | ORAL_TABLET | Freq: Every day | ORAL | Status: DC

## 2018-03-30 MED ORDER — SODIUM CHLORIDE 0.9% FLUSH: 3.0000 mL | INTRAVENOUS | Status: DC | PRN

## 2018-03-30 MED ORDER — POLYETHYLENE GLYCOL 3350 17 G PO PACK: 17.0000 g | PACK | ORAL | Status: DC | PRN

## 2018-03-30 MED ORDER — ONDANSETRON HCL 4 MG/2ML IJ SOLN
4.0000 mg | Freq: Four times a day (QID) | INTRAMUSCULAR | Status: DC | PRN
Start: 2018-03-30 — End: 2018-04-01

## 2018-03-30 MED ORDER — ADULT MULTIVITAMIN W/MINERALS CH
1.0000 | ORAL_TABLET | Freq: Every day | ORAL | Status: DC
  Administered 2018-03-31 – 2018-04-01 (×2): 1 via ORAL
  Filled 2018-03-30 (×2): qty 1

## 2018-03-30 MED ORDER — DILTIAZEM HCL ER COATED BEADS 120 MG PO CP24
120.0000 mg | ORAL_CAPSULE | Freq: Every day | ORAL | Status: DC
  Administered 2018-03-31 – 2018-04-01 (×2): 120 mg via ORAL
  Filled 2018-03-30 (×2): qty 1

## 2018-03-30 NOTE — Progress Notes (Signed)
ANTICOAGULATION CONSULT NOTE - Initial Consult  Pharmacy Consult for coumadin Indication: MVR  Allergies  Allergen Reactions  . Codeine Nausea And Vomiting    Patient Measurements: Height: 5\' 1"  (154.9 cm) Weight: 128 lb (58.1 kg) IBW/kg (Calculated) : 47.8   Vital Signs: Temp: 98.1 F (36.7 C) (07/10 1620) Temp Source: Oral (07/10 1620) BP: 125/67 (07/10 2300) Pulse Rate: 74 (07/10 2300)  Labs: Recent Labs    03/30/18 1639 03/30/18 2051  HGB 9.0*  --   HCT 28.1*  --   PLT 212  --   LABPROT 29.3*  --   INR 2.80  --   CREATININE 1.05*  --   TROPONINI 0.03* 0.03*    Estimated Creatinine Clearance: 37.3 mL/min (A) (by C-G formula based on SCr of 1.05 mg/dL (H)).   Medical History: Past Medical History:  Diagnosis Date  . Anticoagulated on Coumadin    for mech valve and atrial fib  goal 2.0-2.5  . Anxiety   . Arthritis    "right shoulder" (03/15/2013)  . Atrial fibrillation ([place])   . Atrial flutter ([place])   . CAD (coronary artery disease) 08/11/2016  . CHF (congestive heart failure) ([place])   . Chronic combined systolic and diastolic CHF (congestive heart failure) ([place])    a. 02/2016 Echo: EF 45-50%.  . Diverticula of colon   . Exertional shortness of breath   . GERD (gastroesophageal reflux disease)   . Heart murmur   . Hemorrhage intraabdominal 03/17/2012  . History of blood transfusion    "once" (03/15/2013)  . Hyperlipidemia   . Hypertension   . Liver hemorrhage   . Migraines   . 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 Bi-leaflet St. Jude mechanical prosthesis; b. 02/2016 Echo: EF 45-50%, some degree of MR, sev dil LA/RA, sev TR.  . NSTEMI (non-ST elevated myocardial infarction) ([place])    a. 02/2016 elev trop/Cath: nonobs dzs,   . Pacemaker    medtronic adapta  . Pneumonia 2009   resolved.? OPD Rx  . Severe tricuspid regurgitation    a. 02/2016 Echo: Ef 45-50%, sev TR, PASP 58mmHg.  . Sick sinus syndrome ([place])    Dr Cristopher Peru. EP study  negative. Pacemaker 12/15/06 Medtronic  . Stroke [facility])    "they say I had a stroke last year" denies residual on 03/15/2013  . Subdural hematoma ([facility])     Medications:  See medication history  Assessment: 77 yo lady to continue coumadin for h/o MVR. She also has h/o of SDH and liver hemorrhage, INR needed to 2.0-2.5 per card. has Hemoptysis now.  INR 2.8 today Goal of Therapy:  INR 2-2.5 Monitor platelets by anticoagulation protocol: Yes   Plan:  Daily PT/INR  Carrie [name] 03/30/2018,11:47 PM

## 2018-03-30 NOTE — ED Provider Notes (Signed)
[facility] EMERGENCY DEPARTMENT Provider Note   CSN: [id number] Arrival date & time: 03/30/18  1612     History   Chief Complaint Chief Complaint  Patient presents with  . Chest Injury  . pneumorthorax    HPI Carrie [name] is a 77 y.o. 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 She reports a 1-week history of subjective fever, "head fullness," nasal congestion, cough productive of blood-tinged yellow sputum, dyspnea on exertion, and blurry vision. She also endorses bilateral lower extremity swelling R>L, but states that this is her baseline. She denies orthopnea, chest pain, and abdominal pain.   HPI  Past Medical History:  Diagnosis Date  . Anticoagulated on Coumadin    for mech valve and atrial fib  goal 2.0-2.5  . Anxiety   . Arthritis    "right shoulder" (03/15/2013)  . Atrial fibrillation ([place])   . Atrial flutter ([place])   . CAD (coronary artery disease) 08/11/2016  . CHF (congestive heart failure) ([place])   . Chronic combined systolic and diastolic CHF (congestive heart failure) ([place])    a. 02/2016 Echo: EF 45-50%.  . Diverticula of colon   . Exertional shortness of breath   . GERD (gastroesophageal reflux disease)   . Heart murmur   . Hemorrhage intraabdominal 03/17/2012  . History of blood transfusion    "once" (03/15/2013)  . Hyperlipidemia   . Hypertension   . Liver hemorrhage   . Migraines   . 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 Bi-leaflet St. Jude mechanical prosthesis; b. 02/2016 Echo: EF 45-50%, some degree of MR, sev dil LA/RA, sev TR.  . NSTEMI (non-ST elevated myocardial infarction) ([place])    a. 02/2016 elev trop/Cath: nonobs dzs,   . Pacemaker    medtronic adapta  . Pneumonia 2009   resolved.? OPD Rx  . Severe tricuspid regurgitation    a. 02/2016 Echo: Ef 45-50%,  sev TR, PASP 44mmHg.  . Sick sinus syndrome ([place])    Dr Cristopher Peru. EP study negative. Pacemaker 12/15/06 Medtronic  . Stroke [facility])    "they say I had a stroke last year" denies residual on 03/15/2013  . Subdural hematoma [facility])     Patient Active Problem List   Diagnosis Date Noted  . Status post coronary artery stent placement   . Chronic diastolic heart failure ([place])   . H/O mitral valve replacement with mechanical valve   . Hypertensive heart disease with heart failure ([place])   . Pure hypercholesterolemia   . CAD (coronary artery disease) 08/11/2016  . PAH (pulmonary artery hypertension) ([place]) 05/05/2016  . Anemia due to other cause   . AKI (acute kidney injury) ([place])   . Hyponatremia 03/12/2016  . Chest pain 03/11/2016  . NSTEMI (non-ST elevated myocardial infarction) ([place]) 03/11/2016  . 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 Thrombocytopenia ([place]) 12/03/2014  . SSS (sick sinus syndrome) ([place]) 07/24/2014  . Persistent atrial fibrillation ([place]) 07/24/2014  . Pacemaker battery depletion 07/24/2014  . Tachycardia, a fib with RVR  04/03/2013  . Essential hypertension 04/03/2013  . SAH (subarachnoid hemorrhage), December 2012 03/21/2012  . Hemorrhage, hepatic, Rx'd with Coumadin reversal and embolisation 03/17/12 03/17/2012  . 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 Chronic anticoagulation, ( INR goal 2.0-2.5 due to history of subdural hematoma and liver hemorrhage) 09/09/2011  . Muscle  weakness of lower extremity 09/08/2011  . Pacemaker - single chamber Medtronic Adapta, 2008 09/08/2011  . S/P mitral valve replacement, St Jude 09/06/2011  . CONSTIPATION 02/12/2009    Past Surgical History:  Procedure Laterality Date  . APPENDECTOMY    . CARDIAC CATHETERIZATION  04/02/97   R&L:severe MR/pulmonary hypertension  . CARDIAC CATHETERIZATION N/A 03/16/2016   Procedure: Left Heart Cath and Coronary Angiography;  Surgeon: [name], MD;   Location: [facility] CV LAB;  Service: Cardiovascular;  Laterality: N/A;  . CARDIAC CATHETERIZATION N/A 09/30/2016   Procedure: Left Heart Cath and Coronary Angiography;  Surgeon: [name], MD;  Location: [facility] CV LAB;  Service: Cardiovascular;  Laterality: N/A;  . CARDIAC CATHETERIZATION N/A 09/30/2016   Procedure: Coronary Stent Intervention;  Surgeon: [name], MD;  3.5 x 12 mm resolute Onyx  to ostial RCA  . CATARACT EXTRACTION W/ INTRAOCULAR LENS  IMPLANT, BILATERAL Bilateral 2013  . CORONARY ANGIOPLASTY    . DILATION AND CURETTAGE OF UTERUS     "had fibroids" (03/15/2013)  . EXPLORATORY LAPAROTOMY     "had a growth on my intestines" (03/15/2013)  . HAMMER TOE SURGERY Right   . INSERT / REPLACE / REMOVE PACEMAKER  12/15/2006   Medtronic  . MITRAL VALVE REPLACEMENT  1998   St Jude mechanical; Dr. [name]  . PACEMAKER PLACEMENT  12/15/06   medtronic adapta for SSS  . PERMANENT PACEMAKER GENERATOR CHANGE N/A 07/24/2014   Procedure: PERMANENT PACEMAKER GENERATOR CHANGE;  Surgeon: [name], MD;  Location: [facility] CATH LAB;  Service: Cardiovascular;  Laterality: N/A;  . PERSANTINE CARDIOLITE  08/07/03   mild inf. ischemia   . TEE WITHOUT CARDIOVERSION  09/23/2011   Procedure: TRANSESOPHAGEAL ECHOCARDIOGRAM (TEE);  Surgeon: [name];  Location: [facility] ENDOSCOPY;  Service: Cardiovascular;  Laterality: N/A;  . TONSILLECTOMY    . TUBAL LIGATION    . US ECHOCARDIOGRAPHY  11/19/2011   EF 50-55%,RA mod to severely dilated,LA severely dilated,trace MR,small vegetation or mass on the MV,AOV mildly scleroticmild PI, RV pressure 40-23mmHg     OB History   None      Home Medications    Prior to Admission medications   Medication Sig Start Date End Date Taking? Authorizing Provider  atorvastatin (LIPITOR) 40 MG tablet Take 1 tablet (40 mg total) by mouth daily at 6 PM. 10/13/17   Croitoru, Mihai, MD  Biotin 1000 MCG tablet Take 1,000 mcg by mouth daily.    [provider]  CARTIA XT 120 MG 24 hr capsule TAKE ONE CAPSULE BY MOUTH DAILY 09/16/17   Croitoru, Mihai, MD  docusate sodium (COLACE) 100 MG capsule Take 200 mg by mouth 2 (two) times daily.     [provider]  furosemide (LASIX) 40 MG tablet TAKE 1 AND 1/2 TABLETS BY MOUTH DAILY 03/01/18   Croitoru, Mihai, MD  metoprolol tartrate (LOPRESSOR) 25 MG tablet Take 1 tablet (25 mg total) by mouth 2 (two) times daily. 11/10/16   Croitoru, Mihai, MD  Multiple Vitamin (MULTIVITAMINS PO) Take 1 tablet by mouth daily.     [provider]  nitroGLYCERIN (NITROSTAT) 0.4 MG SL tablet Place 1 tablet (0.4 mg total) under the tongue every 5 (five) minutes as needed for chest pain. 11/20/16   Croitoru, Mihai, MD  Omega-3 Fatty Acids (FISH OIL) 1000 MG CAPS Take 1,000 mg by mouth daily.    [provider]  polyethylene glycol (MIRALAX / GLYCOLAX) packet Take 17 g by mouth  as needed (for constipation). Reported on 03/25/2016    [provider]  potassium chloride SA (K-DUR,KLOR-CON) 20 MEQ tablet TAKE 1 TABLET BY MOUTH DAILY 11/01/17   Croitoru, Mihai, MD  warfarin (COUMADIN) 5 MG tablet TAKE 1 TO 1 AND 1/2 TABLETS BY MOUTH DAILY AS DIRECTED 12/28/17   Croitoru, Dani Gobble, MD    Family History Family History  Problem Relation Age of Onset  . Cancer Mother   . Stroke Father   . Cancer Sister   . Leukemia Sister   . Healthy Brother   . Healthy Sister   . Diabetes Sister   . Healthy Brother   . Healthy Brother   . Heart attack Maternal Grandfather   . Kidney disease Daughter   . Stroke Brother     Social History Social History   Tobacco Use  . Smoking status: Never Smoker  . Smokeless tobacco: Never Used  Substance Use Topics  . Alcohol use: No  . Drug use: No     Allergies   Codeine   Review of Systems Review of Systems  Constitutional: Positive for fever. Negative for chills.  HENT: Positive for congestion and rhinorrhea. Negative for sore throat.   Eyes:        Blurry vision  Respiratory: Positive for cough and shortness of breath.   Cardiovascular: Positive for leg swelling. Negative for chest pain.  Gastrointestinal: Negative for abdominal pain, nausea and vomiting.  Genitourinary: Negative for dysuria and flank pain.  Skin: Negative for rash and wound.  Neurological: Negative for dizziness and weakness.     Physical Exam Updated Vital Signs BP 122/72 (BP Location: Left Arm)   Pulse 72   Temp 98.1 F (36.7 C) (Oral)   Resp 17   Ht 5\' 1"  (1.549 m)   Wt 58.1 kg (128 lb)   SpO2 100%   BMI 24.19 kg/m   Physical Exam  Constitutional: She is oriented to person, place, and time. She appears well-developed and well-nourished. No distress.  HENT:  Head: Normocephalic and atraumatic.  Eyes: Pupils are equal, round, and reactive to light. EOM are normal.  Pulmonary/Chest: Effort normal. No respiratory distress. She has no wheezes. She has no rales.  Coarse breath sounds throughout R>L  Abdominal: Soft. Bowel sounds are normal. She exhibits no distension. There is no tenderness. There is no guarding.  Musculoskeletal: Normal range of motion.  1+ pitting edema bilateral lower extremities  Neurological: She is alert and oriented to person, place, and time. No cranial nerve deficit or sensory deficit. She exhibits normal muscle tone.  Skin: Skin is warm and dry. Capillary refill takes less than 2 seconds. No rash noted.  Psychiatric: She has a normal mood and affect. Her behavior is normal.     ED Treatments / Results  Labs (all labs ordered are listed, but only abnormal results are displayed) Labs Reviewed  BASIC METABOLIC PANEL - Abnormal; Notable for the following components:      Result Value   CO2 21 (*)    Creatinine, Ser 1.05 (*)    GFR calc non Af Amer 50 (*)    GFR calc Af Amer 58 (*)    All other components within normal limits  CBC - Abnormal; Notable for the following components:   RBC 3.07 (*)    Hemoglobin 9.0 (*)     HCT 28.1 (*)    RDW 34.0 (*)    All other components within normal limits  TROPONIN I - Abnormal; Notable for  the following components:   Troponin I 0.03 (*)    All other components within normal limits  PROTIME-INR - Abnormal; Notable for the following components:   Prothrombin Time 29.3 (*)    All other components within normal limits    EKG EKG Interpretation  Date/Time:  Wednesday March 30 2018 16:21:14 EDT Ventricular Rate:  70 PR Interval:    QRS Duration: 140 QT Interval:  452 QTC Calculation: 488 R Axis:   -176 Text Interpretation:  Sinus rhythm with A-V dissociation and Wide QRS rhythm Right bundle branch block Possible Lateral infarct , age undetermined Abnormal ECG No significant change since last tracing Confirmed by Wandra Arthurs ([id number]) on 03/30/2018 5:27:51 PM   Radiology Dg Chest 2 View  Result Date: 03/30/2018 CLINICAL DATA:  77 y/o  F; pneumothorax on Monday. EXAM: CHEST - 2 VIEW COMPARISON:  03/28/2018 chest radiograph. FINDINGS: Stable cardiomegaly. Single lead pacemaker. Increased right mid and lower lung zone opacities. Central obstruction may be present. Small right effusion. No pneumothorax. Clear left lung. Post median sternotomy. Bones are unremarkable. IMPRESSION: Increased right mid and lower lung zone opacities with stable small right effusion. Findings may represent right middle and lower lobe atelectasis or pneumonia. Central obstruction is possible. Follow-up per prior chest radiograph. Electronically Signed   By: Kristine Garbe M.D.   On: 03/30/2018 17:50    Procedures Procedures (including critical care time)  Medications Ordered in ED Medications - No data to display   Initial Impression / Assessment and Plan / ED Course  I have reviewed the triage vital signs and the nursing notes.  Pertinent labs & imaging results that were available during my care of the patient were reviewed by me and considered in my medical decision making (see  chart for details).  Carrie [name] is a 77 y.o. 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 She was advised to come to the ED from her PCP after a CXR from 2 days ago was concerning for right middle lung atelectasis. Ddx includes pneumonia, CHF exacerbation, PE, ACS, pulmonary hemorrhage, bronchitis, and upper respiratory infection. Upon arrival, patient was afebrile, hemodynamically stable, and satting 100% on RA. Exam was significant for course lung sounds throughout and 1+ BLE pitting edema. Labs were significant for troponin 0.03 x2, BNP 1780, lactate wnl, normal white count, and INR of 2.8 (target 2.0-2.5). EKG showed no changes from prior. CXR showed increased right mid and lower lung zone opacities with a stable small right effusion representing right middle and lower lobe atelectasis or pneumonia. Chest CT was obtained given the patient's abnormal cxr, hemoptysis and concern for pneumonia vs. Malignancy. Chest CT showed scarring/atelectasis in the right middle and lower lobes, marked cardiomegaly, and mild septal thickening and patchy ground-glass opacity, favoring edema over infection. Head CT was also performed given patient's "head congestion" and blurry vision in the setting of an elevated INR and history of intracranial hemorrhage while anticoagulated. Head CT was negative for acute intracranial abnormality. Patient given 40 mg IV lasix for volume overload. Patient received 1g rocephin and 500mg  azithromycin for concern for community acquired pneumonia. Patient warrants admission for further evaluation of CHF exacerbation and possible pneumonia. Patient accepted by Dr. Blaine Hamper.  Final Clinical Impressions(s) / ED Diagnoses   Final diagnoses:  None    ED Discharge Orders    None  Karlita Lichtman, Andree Elk, MD 03/30/18  2349    Drenda Freeze, MD 03/31/18 [phone]

## 2018-03-30 NOTE — ED Triage Notes (Signed)
The pt was seen on Monday for chest congestion at her doctors  And she had a chest xray.  todsy the office called and told her she had a penumothorax   No known injury  Not sob  No pain

## 2018-03-30 NOTE — ED Notes (Signed)
Pt in xray

## 2018-03-30 NOTE — H&P (Signed)
History and Physical    LESHAWN [name] QMV:[id number] DOB: 11-05-1940 DOA: 03/30/2018  Referring MD/NP/PA:   PCP: Lucianne Lei, MD   Patient coming from:  The patient is coming from home.  At baseline, pt is independent for most of ADL.   Chief Complaint: Cough, shortness of breath, hemoptysis, blurry vision  HPI: Carrie [name] is a 77 y.o. 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  Patient states that she has been having cough and shortness of breath for about a week.  She coughs up streaks of blood, sometimes with yellow-colored sputum production.  No fever or chills.  No chest pain.  No runny nose or sore throat.  Her shortness breath is aggravated by exertion.  She also has bilateral leg edema.  Patient states that she has intermittent mild diarrhea recently, 2 loose stool bowel movement today, no nausea vomiting, abdominal pain.  She also reports blurry vision in the past 2 days, which has resolved today.  Patient denies unilateral weakness, numbness or tingling his extremities.  No hearing loss.  Patient denies symptoms of UTI. Pt was seen by PCP on Monday, and had abnormal chest x-ray, and advised to come to hospital for further evaluation treatment.  Of note, her INR should be tightly targeted at 2.0-2.5 per her cardiologist, Dr.Croitoru since pt had hx of subdural hematoma and liver hemorrhage.  ED Course: pt was found to have WBC 4.7, INR 2.8, lactic acid of 1.31, 0.88, troponin +0.03, BNP 1780, creatinine 1.05, temperature normal, no tachycardia, has tachypnea, oxygen saturation 93% on room air. Patient is admitted to stepdown as  inpatient.  CT-head showed: Mild chronic ischemic white matter disease. Mild diffuse cortical atrophy. Subcortical white matter low density is noted in right cerebellar hemisphere consistent with infarction of indeterminate age.  CT-chest showed: 1. Radiographic findings reflect scarring/atelectasis in the right middle and lower lobes. 2. Marked cardiomegaly. Elevated right heart pressure with prominent distention and reflux of infra diaphragmatic veins. 3. Mild septal thickening and patchy ground-glass opacity, favor mild edema over infection.  4. Small right pleural effusion.  Review of Systems:   General: no fevers, chills, has poor appetite, has fatigue HEENT: no blurry vision, hearing changes or sore throat Respiratory: has dyspnea, coughing, no wheezing CV: no chest pain, no palpitations GI: no nausea, vomiting, abdominal pain, has diarrhea, no constipation GU: no dysuria, burning on urination, increased urinary frequency, hematuria  Ext: has leg edema Neuro: no unilateral weakness, numbness, or tingling, no hearing loss. Has blurry vision. Skin: no rash, no skin tear. MSK: No muscle spasm, no deformity, no limitation of range of movement in spin Heme: No easy bruising.  Travel history: No recent long distant travel.  Allergy:  Allergies  Allergen Reactions  . Codeine Nausea And Vomiting    Past Medical History:  Diagnosis Date  . Anticoagulated on Coumadin    for mech valve and atrial fib  goal 2.0-2.5  . Anxiety   . Arthritis    "right shoulder" (03/15/2013)  . Atrial fibrillation ([place])   . Atrial flutter ([place])   . CAD (coronary artery disease) 08/11/2016  . CHF (congestive heart failure) ([place])   . Chronic combined systolic and diastolic CHF (congestive heart failure) ([place])  a. 02/2016 Echo: EF 45-50%.  . Diverticula of colon   . Exertional shortness of breath   . GERD (gastroesophageal reflux disease)   . Heart murmur   . Hemorrhage intraabdominal 03/17/2012   . History of blood transfusion    "once" (03/15/2013)  . Hyperlipidemia   . Hypertension   . Liver hemorrhage   . Migraines   . 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 Bi-leaflet St. Jude mechanical prosthesis; b. 02/2016 Echo: EF 45-50%, some degree of MR, sev dil LA/RA, sev TR.  . NSTEMI (non-ST elevated myocardial infarction) ([place])    a. 02/2016 elev trop/Cath: nonobs dzs,   . Pacemaker    medtronic adapta  . Pneumonia 2009   resolved.? OPD Rx  . Severe tricuspid regurgitation    a. 02/2016 Echo: Ef 45-50%, sev TR, PASP 30mmHg.  . Sick sinus syndrome ([place])    Dr Cristopher Peru. EP study negative. Pacemaker 12/15/06 Medtronic  . Stroke [facility])    "they say I had a stroke last year" denies residual on 03/15/2013  . Subdural hematoma [facility])     Past Surgical History:  Procedure Laterality Date  . APPENDECTOMY    . CARDIAC CATHETERIZATION  04/02/97   R&L:severe MR/pulmonary hypertension  . CARDIAC CATHETERIZATION N/A 03/16/2016   Procedure: Left Heart Cath and Coronary Angiography;  Surgeon: [name], MD;  Location: [place] CV LAB;  Service: Cardiovascular;  Laterality: N/A;  . CARDIAC CATHETERIZATION N/A 09/30/2016   Procedure: Left Heart Cath and Coronary Angiography;  Surgeon: [name], MD;  Location: [place] CV LAB;  Service: Cardiovascular;  Laterality: N/A;  . CARDIAC CATHETERIZATION N/A 09/30/2016   Procedure: Coronary Stent Intervention;  Surgeon: [name], MD;  3.5 x 12 mm resolute Onyx  to ostial RCA  . CATARACT EXTRACTION W/ INTRAOCULAR LENS  IMPLANT, BILATERAL Bilateral 2013  . CORONARY ANGIOPLASTY    . DILATION AND CURETTAGE OF UTERUS     "had fibroids" (03/15/2013)  . EXPLORATORY LAPAROTOMY     "had a growth on my intestines" (03/15/2013)  . HAMMER TOE SURGERY Right   . INSERT / REPLACE / REMOVE PACEMAKER  12/15/2006   Medtronic  . MITRAL VALVE REPLACEMENT  1998   St Jude mechanical; Dr. [name]  . PACEMAKER PLACEMENT   12/15/06   medtronic adapta for SSS  . PERMANENT PACEMAKER GENERATOR CHANGE N/A 07/24/2014   Procedure: PERMANENT PACEMAKER GENERATOR CHANGE;  Surgeon: [name], MD;  Location: [place] CATH LAB;  Service: Cardiovascular;  Laterality: N/A;  . PERSANTINE CARDIOLITE  08/07/03   mild inf. ischemia   . TEE WITHOUT CARDIOVERSION  09/23/2011   Procedure: TRANSESOPHAGEAL ECHOCARDIOGRAM (TEE);  Surgeon: [name];  Location: [facility] ENDOSCOPY;  Service: Cardiovascular;  Laterality: N/A;  . TONSILLECTOMY    . TUBAL LIGATION    . US ECHOCARDIOGRAPHY  11/19/2011   EF 50-55%,RA mod to severely dilated,LA severely dilated,trace MR,small vegetation or mass on the MV,AOV mildly scleroticmild PI, RV pressure 40-89mmHg    Social History:  reports that she has never smoked. She has never used smokeless tobacco. She reports that she does not drink alcohol or use drugs.  Family History:  Family History  Problem Relation Age of Onset  . Cancer Mother   . Stroke Father   . Cancer Sister   . Leukemia Sister   . Healthy Brother   . Healthy Sister   . Diabetes Sister   . Healthy Brother   . Healthy Brother   .  Heart attack Maternal Grandfather   . Kidney disease Daughter   . Stroke Brother      Prior to Admission medications   Medication Sig Start Date End Date Taking? Authorizing Provider  atorvastatin (LIPITOR) 40 MG tablet Take 1 tablet (40 mg total) by mouth daily at 6 PM. 10/13/17  Yes Croitoru, Mihai, MD  Biotin 1000 MCG tablet Take 1,000 mcg by mouth daily.   Yes [provider]  CARTIA XT 120 MG 24 hr capsule TAKE ONE CAPSULE BY MOUTH DAILY 09/16/17  Yes Croitoru, Mihai, MD  docusate sodium (COLACE) 100 MG capsule Take 200 mg by mouth 2 (two) times daily.    Yes [provider]  furosemide (LASIX) 40 MG tablet TAKE 1 AND 1/2 TABLETS BY MOUTH DAILY 03/01/18  Yes Croitoru, Mihai, MD  metoprolol tartrate (LOPRESSOR) 25 MG tablet Take 1 tablet (25 mg total) by mouth 2 (two) times  daily. 11/10/16  Yes Croitoru, Mihai, MD  Multiple Vitamin (MULTIVITAMINS PO) Take 1 tablet by mouth daily.    Yes [provider]  nitroGLYCERIN (NITROSTAT) 0.4 MG SL tablet Place 1 tablet (0.4 mg total) under the tongue every 5 (five) minutes as needed for chest pain. 11/20/16  Yes Croitoru, Mihai, MD  Omega-3 Fatty Acids (FISH OIL) 1000 MG CAPS Take 1,000 mg by mouth daily.   Yes [provider]  polyethylene glycol (MIRALAX / GLYCOLAX) packet Take 17 g by mouth as needed (for constipation). Reported on 03/25/2016   Yes [provider]  potassium chloride SA (K-DUR,KLOR-CON) 20 MEQ tablet TAKE 1 TABLET BY MOUTH DAILY 11/01/17  Yes Croitoru, Mihai, MD  warfarin (COUMADIN) 5 MG tablet TAKE 1 TO 1 AND 1/2 TABLETS BY MOUTH DAILY AS DIRECTED Patient taking differently: TAKE 5MG  DAILY EXCEPT ON MONDAYS TAKE 7.5MG . 12/28/17  Yes Croitoru, Dani Gobble, MD    Physical Exam: Vitals:   03/31/18 0000 03/31/18 0051 03/31/18 0052 03/31/18 0133  BP: 109/64  129/81 129/81  Pulse: 69  85 (!) 59  Resp: (!) 26  19   Temp:  98.3 F (36.8 C)    TempSrc:  Oral    SpO2: 95%  98%   Weight:   56.3 kg (124 lb 1.6 oz)   Height:   5\' 1"  (1.549 m)    General: Not in acute distress HEENT:       Eyes: PERRL, EOMI, no scleral icterus.       ENT: No discharge from the ears and nose, no pharynx injection, no tonsillar enlargement.        Neck: positive JVD, no bruit, no mass felt. Heme: No neck lymph node enlargement. Cardiac: L3/T3, RRR, systolic murmurs 4/6 and MV mechanic valve click sounds, No gallops or rubs. Respiratory: No rales, wheezing, rhonchi or rubs. GI: Soft, nondistended, nontender, no rebound pain, no organomegaly, BS present. GU: No hematuria Ext: 2+ pitting leg edema bilaterally. 2+DP/PT pulse bilaterally. Musculoskeletal: No joint deformities, No joint redness or warmth, no limitation of ROM in spin. Skin: No rashes.  Neuro: Alert, oriented X3, cranial nerves II-XII grossly  intact, moves all extremities normally. Muscle strength 5/5 in all extremities, sensation to light touch intact. Brachial reflex 2+ bilaterally. Negative Babinski's sign. Normal finger to nose test. Psych: Patient is not psychotic, no suicidal or hemocidal ideation.  Labs on Admission: I have personally reviewed following labs and imaging studies  CBC: Recent Labs  Lab 03/30/18 1639  WBC 4.7  HGB 9.0*  HCT 28.1*  MCV 91.5  PLT  562   Basic Metabolic Panel: Recent Labs  Lab 03/30/18 1639  NA 137  K 4.6  CL 106  CO2 21*  GLUCOSE 93  BUN 19  CREATININE 1.05*  CALCIUM 9.5   GFR: Estimated Creatinine Clearance: 34.4 mL/min (A) (by C-G formula based on SCr of 1.05 mg/dL (H)). Liver Function Tests: Recent Labs  Lab 03/30/18 2051  AST 137*  ALT 28  ALKPHOS 117  BILITOT 1.7*  PROT 7.2  ALBUMIN 3.0*   No results for input(s): LIPASE, AMYLASE in the last 168 hours. No results for input(s): AMMONIA in the last 168 hours. Coagulation Profile: Recent Labs  Lab 03/30/18 1639  INR 2.80   Cardiac Enzymes: Recent Labs  Lab 03/30/18 1639 03/30/18 2051 03/31/18 0112  TROPONINI 0.03* 0.03* 0.03*   BNP (last 3 results) No results for input(s): PROBNP in the last 8760 hours. HbA1C: Recent Labs    03/31/18 0112  HGBA1C 4.7*   CBG: No results for input(s): GLUCAP in the last 168 hours. Lipid Profile: Recent Labs    03/31/18 0112  CHOL 87  HDL 31*  LDLCALC 47  TRIG 46  CHOLHDL 2.8   Thyroid Function Tests: No results for input(s): TSH, T4TOTAL, FREET4, T3FREE, THYROIDAB in the last 72 hours. Anemia Panel: No results for input(s): VITAMINB12, FOLATE, FERRITIN, TIBC, IRON, RETICCTPCT in the last 72 hours. Urine analysis:    Component Value Date/Time   COLORURINE YELLOW 03/18/2016 1332   APPEARANCEUR CLEAR 03/18/2016 1332   LABSPEC 1.013 03/18/2016 1332   PHURINE 7.0 03/18/2016 1332   GLUCOSEU NEGATIVE 03/18/2016 1332   HGBUR MODERATE (A) 03/18/2016 1332    BILIRUBINUR NEGATIVE 03/18/2016 1332   KETONESUR NEGATIVE 03/18/2016 1332   PROTEINUR NEGATIVE 03/18/2016 1332   UROBILINOGEN 0.2 03/15/2013 1323   NITRITE NEGATIVE 03/18/2016 1332   LEUKOCYTESUR NEGATIVE 03/18/2016 1332   Sepsis Labs: @LABRCNTIP (procalcitonin:4,lacticidven:4) ) Recent Results (from the past 240 hour(s))  MRSA PCR Screening     Status: None   Collection Time: 03/31/18 12:54 AM  Result Value Ref Range Status   MRSA by PCR NEGATIVE NEGATIVE Final    Comment:        The GeneXpert MRSA Assay (FDA approved for NASAL specimens only), is one component of a comprehensive MRSA colonization surveillance program. It is not intended to diagnose MRSA infection nor to guide or monitor treatment for MRSA infections. Performed at [facility], [place] [address], [place], [place] [address]      Radiological Exams on Admission: Dg Chest 2 View  Result Date: 03/30/2018 CLINICAL DATA:  77 y/o  F; pneumothorax on Monday. EXAM: CHEST - 2 VIEW COMPARISON:  03/28/2018 chest radiograph. FINDINGS: Stable cardiomegaly. Single lead pacemaker. Increased right mid and lower lung zone opacities. Central obstruction may be present. Small right effusion. No pneumothorax. Clear left lung. Post median sternotomy. Bones are unremarkable. IMPRESSION: Increased right mid and lower lung zone opacities with stable small right effusion. Findings may represent right middle and lower lobe atelectasis or pneumonia. Central obstruction is possible. Follow-up per prior chest radiograph. Electronically Signed   By: Kristine Garbe M.D.   On: 03/30/2018 17:50   Ct Head Wo Contrast  Result Date: 03/30/2018 CLINICAL DATA:  Headache. EXAM: CT HEAD WITHOUT CONTRAST TECHNIQUE: Contiguous axial images were obtained from the base of the skull through the vertex without intravenous contrast. COMPARISON:  CT scan of March 15, 2013. FINDINGS: Brain: Mild chronic ischemic white matter disease is noted. Mild  diffuse cortical atrophy  is noted. New subcortical white matter low density is noted in right cerebellar hemisphere consistent with infarction of indeterminate age. No evidence of mass lesion or hemorrhage is noted. Ventricular size is within normal limits. No mass effect or midline shift is noted. Vascular: Mild chronic ischemic white matter disease is noted. Skull: Normal. Negative for fracture or focal lesion. Sinuses/Orbits: Mild left maxillary sinusitis is noted. Other: None. IMPRESSION: Mild chronic ischemic white matter disease. Mild diffuse cortical atrophy. Subcortical white matter low density is noted in right cerebellar hemisphere consistent with infarction of indeterminate age; MRI is recommended for further evaluation. Electronically Signed   By: Marijo Conception, M.D.   On: 03/30/2018 19:50   Ct Chest W Contrast  Result Date: 03/30/2018 CLINICAL DATA:  Acute respiratory illness EXAM: CT CHEST WITH CONTRAST TECHNIQUE: Multidetector CT imaging of the chest was performed during intravenous contrast administration. CONTRAST:  83mL OMNIPAQUE IOHEXOL 300 MG/ML  SOLN COMPARISON:  Chest x-ray from earlier today FINDINGS: Cardiovascular: Severe cardiomegaly, especially the atria. Status post mitral valve replacement. Single chamber pacer into the right ventricle. No pericardial effusion. Limited opacification of the aorta. There is prominent hepatic vein and hepatic cava reflux and distension that continues into the renal veins. Probable stenosis at the distal left brachiocephalic vein due to pacer lead, which accounts for multiple venous collaterals in the upper chest. Apparent eccentric filling defect at the branching of the left main pulmonary artery is extraluminal based on reformats. No indication of acute pulmonary embolism. Mediastinum/Nodes: Negative for adenopathy. Lungs/Pleura: Right-sided lung opacity reflects middle and lower lobe atelectasis or scarring with small right effusion. Mild septal  thickening and patchy ground-glass opacity, likely mild edema. Upper Abdomen: Venous reflux as noted above.  No acute finding. Musculoskeletal: Degenerative changes in the thoracic spine. No acute finding. IMPRESSION: 1. Radiographic findings reflect scarring/atelectasis in the right middle and lower lobes. 2. Marked cardiomegaly. Elevated right heart pressure with prominent distention and reflux of infra diaphragmatic veins. 3. Mild septal thickening and patchy ground-glass opacity, favor mild edema over infection. Small right pleural effusion. Electronically Signed   By: Monte Fantasia M.D.   On: 03/30/2018 19:50     EKG: Independently reviewed.  Atrial fibrillation, QTc 488, right bundle blockage, RAD.   Assessment/Plan Principal Problem:   Acute on chronic diastolic (congestive) heart failure (HCC) Active Problems:   Pacemaker - single chamber Medtronic Adapta, 2008   Chronic anticoagulation, ( INR goal 2.0-2.5 due to history of subdural hematoma and liver hemorrhage)   Essential hypertension   Persistent atrial fibrillation (HCC)   PAH (pulmonary artery hypertension) (HCC)   CAD (coronary artery disease)   H/O mitral valve replacement with mechanical valve   Hemoptysis   Elevated troponin   CKD (chronic kidney disease), stage III (HCC)   HLD (hyperlipidemia)   Blurry vision   Diarrhea   Abnormal LFTs   Acute on chronic diastolic (congestive) heart failure and PAH : Patient has 2+ leg edema, elevated BNP 780, shortness of breath, and positive JVD, consistent with CHF exacerbation.  2D echo on 10/01/2016 showed EF of 50-55%. Pt was given 1 dose of Rocephin and azithromycin by EDP due to concerning for possible pneumonia patient does not have fever or leukocytosis.  Clinically does not seem to have pneumonia.  Will not continue antibiotics, but will get sputu one patient m culture and blood culture.  -will admit to [facility] as inpt. -Lasix 40 mg bid by IV -2d echo -will continue home  metoprolol -Daily  weights -strict I/O's -Low salt diet  CAD and elevated troponin: Trop 0.03. No CP.  Likely due to demand ischemia secondary to CHF exacerbation.  -cycle CE q6 x3 and repeat EKG in the am  - prn Nitroglycerin, Morphine, - Hold Lipitor due to liver function - hold ASA due to hemoptysis - Risk factor stratification: will check FLP and A1C  - 2d echo - card consult was requested via Epic  H/O mitral valve replacement with mechanical valve: due to hx of liver hemorrhage and subdural hematoma, her INR goal is 2.0-2.5 per her cardiologist, Dr. Sallyanne Kuster. Her INR is 2.80.  -asked pharm to dosing coumadin  Hemoptysis: she has mild hemoptysis.  Her hemoglobin was 9.6 on 12/17/2016--> 9.0 on admission. Likely due to " supratherapeutic INR" for her. -Observe closely. -Follow-up CBC  Atrial Fibrillation: CHA2DS2-VASc Score is 8, needs oral anticoagulation. Patient is on Coumadin at home. Heart rate is well controlled. -On Coumadin -Continue metoprolol and cardizem   Abnormal liver function: AST 137, ALT 28, total bilirubin 1.7, ALP 117.  Possibly due to liver congestion secondary to CHF exacerbation.  But need to rule out other possibilities. - Hepatitis panel - Avoid using Tylenol -Hold Lipitor  HTN:  -Continue home medications: Metoprolol and Cardizem -IV hydralazine prn  CKD (chronic kidney disease), stage III ([place]): stable. Cre 1.05 -f/u by BMP  HLD (hyperlipidemia): -hold lipitor due to abnormal LFT  Blurry vision: Etiology is not clear.  Stroke is a potential differential diagnosis.  CT head showed mild chronic ischemic white matter disease. Mild diffuse cortical atrophy. Subcortical white matter low density is noted in right cerebellar hemisphere consistent with infarction of indeterminate age. -on coumadin -carotid doppler and 2d echo. -pt cannot do MRI due to pacemaker placement and mechanical valve    DVT ppx: On coumadin Code Status: Full code Family  Communication:  Yes, patient's friend  at bed side Disposition Plan:  Anticipate discharge back to previous home environment Consults called:  none Admission status:    SDU/inpation       Date of Service 03/31/2018    Ivor Costa [facility] Pager [phone]  If 7PM-7AM, please contact night-coverage [email or web address] Password [email or web address] 03/31/2018, 4:30 AM

## 2018-03-31 ENCOUNTER — Inpatient Hospital Stay (HOSPITAL_COMMUNITY): Payer: Medicare Other

## 2018-03-31 ENCOUNTER — Other Ambulatory Visit: Payer: Self-pay

## 2018-03-31 DIAGNOSIS — H538 Other visual disturbances: Secondary | ICD-10-CM

## 2018-03-31 DIAGNOSIS — Z950 Presence of cardiac pacemaker: Secondary | ICD-10-CM

## 2018-03-31 DIAGNOSIS — I361 Nonrheumatic tricuspid (valve) insufficiency: Secondary | ICD-10-CM

## 2018-03-31 DIAGNOSIS — Z952 Presence of prosthetic heart valve: Secondary | ICD-10-CM

## 2018-03-31 DIAGNOSIS — R748 Abnormal levels of other serum enzymes: Secondary | ICD-10-CM

## 2018-03-31 DIAGNOSIS — I5033 Acute on chronic diastolic (congestive) heart failure: Secondary | ICD-10-CM

## 2018-03-31 DIAGNOSIS — Z7901 Long term (current) use of anticoagulants: Secondary | ICD-10-CM

## 2018-03-31 DIAGNOSIS — I10 Essential (primary) hypertension: Secondary | ICD-10-CM

## 2018-03-31 DIAGNOSIS — I481 Persistent atrial fibrillation: Secondary | ICD-10-CM

## 2018-03-31 DIAGNOSIS — H547 Unspecified visual loss: Secondary | ICD-10-CM

## 2018-03-31 DIAGNOSIS — I130 Hypertensive heart and chronic kidney disease with heart failure and stage 1 through stage 4 chronic kidney disease, or unspecified chronic kidney disease: Principal | ICD-10-CM

## 2018-03-31 DIAGNOSIS — R945 Abnormal results of liver function studies: Secondary | ICD-10-CM | POA: Diagnosis present

## 2018-03-31 DIAGNOSIS — N183 Chronic kidney disease, stage 3 (moderate): Secondary | ICD-10-CM

## 2018-03-31 DIAGNOSIS — R197 Diarrhea, unspecified: Secondary | ICD-10-CM

## 2018-03-31 DIAGNOSIS — R7989 Other specified abnormal findings of blood chemistry: Secondary | ICD-10-CM | POA: Diagnosis present

## 2018-03-31 LAB — LIPID PANEL
CHOL/HDL RATIO: 2.8 ratio
Cholesterol: 87 mg/dL (ref 0–200)
HDL: 31 mg/dL — ABNORMAL LOW (ref >40)
LDL CALC: 47 mg/dL (ref 0–99)
Triglycerides: 46 mg/dL (ref <150)
VLDL: 9 mg/dL (ref 0–40)

## 2018-03-31 LAB — BASIC METABOLIC PANEL
ANION GAP: 12 (ref 5–15)
BUN: 18 mg/dL (ref 8–23)
CALCIUM: 9.5 mg/dL (ref 8.9–10.3)
CO2: 25 mmol/L (ref 22–32)
Chloride: 101 mmol/L (ref 98–111)
Creatinine, Ser: 1.08 mg/dL — ABNORMAL HIGH (ref 0.44–1.00)
GFR, EST AFRICAN AMERICAN: 56 mL/min — AB (ref >60)
GFR, EST NON AFRICAN AMERICAN: 49 mL/min — AB (ref >60)
Glucose, Bld: 88 mg/dL (ref 70–99)
POTASSIUM: 3.7 mmol/L (ref 3.5–5.1)
SODIUM: 138 mmol/L (ref 135–145)

## 2018-03-31 LAB — ECHOCARDIOGRAM COMPLETE
HEIGHTINCHES: 61 in
WEIGHTICAEL: 1985.9 [oz_av]

## 2018-03-31 LAB — MRSA PCR SCREENING: MRSA by PCR: NEGATIVE

## 2018-03-31 LAB — PROTIME-INR
INR: 2.59
Prothrombin Time: 27.6 seconds — ABNORMAL HIGH (ref 11.4–15.2)

## 2018-03-31 LAB — TROPONIN I
TROPONIN I: 0.03 ng/mL — AB (ref <0.03)
TROPONIN I: 0.04 ng/mL — AB (ref <0.03)
Troponin I: 0.04 ng/mL (ref <0.03)

## 2018-03-31 LAB — HEMOGLOBIN A1C
Hgb A1c MFr Bld: 4.7 % — ABNORMAL LOW (ref 4.8–5.6)
Mean Plasma Glucose: 88.19 mg/dL

## 2018-03-31 MED ORDER — WARFARIN - PHARMACIST DOSING INPATIENT: Freq: Every day | Status: DC

## 2018-03-31 MED ORDER — POTASSIUM CHLORIDE CRYS ER 20 MEQ PO TBCR
20.0000 meq | EXTENDED_RELEASE_TABLET | Freq: Every day | ORAL | Status: DC
  Administered 2018-03-31 – 2018-04-01 (×2): 20 meq via ORAL
  Filled 2018-03-31 (×2): qty 1

## 2018-03-31 MED ORDER — HYDRALAZINE HCL 20 MG/ML IJ SOLN: 5.0000 mg | INTRAMUSCULAR | Status: DC | PRN

## 2018-03-31 MED ORDER — WARFARIN SODIUM 5 MG PO TABS
5.0000 mg | ORAL_TABLET | Freq: Once | ORAL | Status: AC
  Administered 2018-03-31: 5 mg via ORAL
  Filled 2018-03-31: qty 1

## 2018-03-31 MED ORDER — FUROSEMIDE 10 MG/ML IJ SOLN
60.0000 mg | Freq: Every day | INTRAMUSCULAR | Status: DC
  Administered 2018-03-31: 60 mg via INTRAVENOUS
  Filled 2018-03-31: qty 6

## 2018-03-31 MED ORDER — FUROSEMIDE 10 MG/ML IJ SOLN
80.0000 mg | INTRAMUSCULAR | Status: DC
  Filled 2018-03-31 (×2): qty 8

## 2018-03-31 NOTE — Progress Notes (Signed)
  Echocardiogram 2D Echocardiogram has been performed.  Carrie [name] 03/31/2018, 3:08 PM

## 2018-03-31 NOTE — Progress Notes (Signed)
ANTICOAGULATION CONSULT NOTE - Initial Consult  Pharmacy Consult for coumadin Indication: MVR  Allergies  Allergen Reactions   Codeine Nausea And Vomiting    Patient Measurements: Height: 5\' 1"  (154.9 cm) Weight: 124 lb 1.9 oz (56.3 kg) IBW/kg (Calculated) : 47.8   Vital Signs: Temp: 98.2 F (36.8 C) (07/11 0857) Temp Source: Oral (07/11 0857) BP: 110/55 (07/11 0857) Pulse Rate: 62 (07/11 0434)  Labs: Recent Labs    03/30/18 1639 03/30/18 2051 03/31/18 0112 03/31/18 0713  HGB 9.0*  --   --   --   HCT 28.1*  --   --   --   PLT 212  --   --   --   LABPROT 29.3*  --   --  27.6*  INR 2.80  --   --  2.59  CREATININE 1.05*  --   --  1.08*  TROPONINI 0.03* 0.03* 0.03* 0.04*    Estimated Creatinine Clearance: 33.4 mL/min (A) (by C-G formula based on SCr of 1.08 mg/dL (H)).   Medical History: Past Medical History:  Diagnosis Date   Anticoagulated on Coumadin    for mech valve and atrial fib  goal 2.0-2.5   Anxiety    Arthritis    "right shoulder" (03/15/2013)   Atrial fibrillation (HCC)    Atrial flutter (HCC)    CAD (coronary artery disease) 08/11/2016   CHF (congestive heart failure) (HCC)    Chronic combined systolic and diastolic CHF (congestive heart failure) ([place])    a. 02/2016 Echo: EF 45-50%.   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 Bi-leaflet St. Jude mechanical prosthesis; b. 02/2016 Echo: EF 45-50%, some degree of MR, sev dil LA/RA, sev TR.   NSTEMI (non-ST elevated myocardial infarction) ([place])    a. 02/2016 elev trop/Cath: nonobs dzs,    Pacemaker    medtronic adapta   Pneumonia 2009   resolved.? OPD Rx   Severe tricuspid regurgitation     a. 02/2016 Echo: Ef 45-50%, sev TR, PASP 67mmHg.   Sick sinus syndrome (HCC)    Dr Cristopher Peru. EP study negative. Pacemaker 12/15/06 Medtronic   Stroke [facility])    "they say I had a stroke last year" denies residual on 03/15/2013   Subdural hematoma (HCC)     Medications:  See medication history  Assessment: 77 yo lady to continue coumadin for h/o MVR. She also has h/o of SDH and liver hemorrhage, INR goal 2.0-2.5 per card. Hemoptysis noted on admission.    PTA dose 5mg  daily, except 7.5mg  on Monday  INR 2.59 today  Goal of Therapy:  INR 2-2.5 Monitor platelets by anticoagulation protocol: Yes   Plan:  Warfarin 5mg  today Daily PT/INR  Juanell Fairly, PharmD PGY1 Pharmacy Resident Phone [phone] 03/31/2018  10:06 AM

## 2018-03-31 NOTE — Consult Note (Addendum)
Cardiology Consultation:   Patient ID: Carrie [name]; [id number]; 02-Jan-1941   Admit date: 03/30/2018 Date of Consult: 03/31/2018  Primary Care Provider: Lucianne Lei, MD Primary Cardiologist: [name], MD  Primary Electrophysiologist:     Patient Profile:   Carrie [name] is a 77 y.o. 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 Karleen Hampshire.  History of Present Illness:   [name] was last seen in clinic on 10/06/17 with Dr. Sallyanne Kuster. She was doing well at that time, weighing daily (129-131 lbs) and avoiding sodium. Last myoview 04/29/17 was low risk with EF of 67%. She was maintaining on 80 mg lasix in the AM and 60 mg lasix in the afternoon. Overall, she was doing well at that appt. 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 PPM with normal device function. INR goal is 2.0-2.5 due to history of subdural hematoma and liver hemorrhage.   She presented to [facility] 03/30/18 with cough and shortness of breath for 1 week. She sometimes coughs up streaks of blood vs yellow-colored sputum. She denies recent illness and chest pain. She also presented with abdominal complaints and blurry vision. Head CT was negative for acute abnormality. WBC was 4.7, INR 2.8, and lactic acid 0.88 (1.31). Troponin mildly elevated to 0.03 and BNP elevated to 1781 with normal creatinine.  CT chest with cardiomegaly, elevated right heart pressure, and edema  with small right pleural effusion.  She was admitted and diuresed with 40 mg IV lasix BID, echo ordered. Cardiology was consulted for management of acute on chronic diastolic heart failure.   On my interview, she states she developed an URI earlier this week with cough and productive sputum. She also missed about 2 days of lasix because she was going to doctor's appts. She developed lower extremity swelling, dyspnea on exertion that developed into shortness of breath at rest. She continues to deny chest pain. Of note, she fell down the stairs and hit her head about a month ago and did not seek medical care at that time.   Past Medical History:  Diagnosis Date  . Anticoagulated on Coumadin    for mech valve and atrial fib  goal 2.0-2.5  . Anxiety   . Arthritis    "right shoulder" (03/15/2013)  . Atrial fibrillation ([place])   . Atrial flutter ([place])   . CAD (coronary artery disease) 08/11/2016  . CHF (congestive heart failure) ([place])   . Chronic combined systolic and diastolic CHF (congestive heart failure) ([place])    a. 02/2016 Echo: EF 45-50%.  . Diverticula of colon   . Exertional shortness of breath   . GERD (gastroesophageal reflux disease)   . Heart murmur   . Hemorrhage intraabdominal 03/17/2012  . History of blood transfusion    "once" (03/15/2013)  . Hyperlipidemia   . Hypertension   . Liver hemorrhage   . Migraines   . 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 Bi-leaflet St. Jude mechanical prosthesis; b.  02/2016 Echo: EF 45-50%, some degree of MR, sev dil LA/RA, sev TR.  . NSTEMI (non-ST elevated myocardial infarction) ([place])    a. 02/2016 elev trop/Cath: nonobs dzs,   . Pacemaker    medtronic adapta  . Pneumonia 2009   resolved.? OPD Rx  . Severe tricuspid regurgitation    a. 02/2016 Echo: Ef 45-50%, sev TR, PASP 17mmHg.  . Sick sinus syndrome ([place])    Dr Cristopher Peru. EP study negative. Pacemaker 12/15/06 Medtronic  . Stroke [facility])    "they say I had a stroke last  year" denies residual on 03/15/2013  . Subdural hematoma [facility])     Past Surgical History:  Procedure Laterality Date  . APPENDECTOMY    . CARDIAC CATHETERIZATION  04/02/97   R&L:severe MR/pulmonary hypertension  . CARDIAC CATHETERIZATION N/A 03/16/2016   Procedure: Left Heart Cath and Coronary Angiography;  Surgeon: [name], MD;  Location: [place] CV LAB;  Service: Cardiovascular;  Laterality: N/A;  . CARDIAC CATHETERIZATION N/A 09/30/2016   Procedure: Left Heart Cath and Coronary Angiography;  Surgeon: [name], MD;  Location: [place] CV LAB;  Service: Cardiovascular;  Laterality: N/A;  . CARDIAC CATHETERIZATION N/A 09/30/2016   Procedure: Coronary Stent Intervention;  Surgeon: [name], MD;  3.5 x 12 mm resolute Onyx  to ostial RCA  . CATARACT EXTRACTION W/ INTRAOCULAR LENS  IMPLANT, BILATERAL Bilateral 2013  . CORONARY ANGIOPLASTY    . DILATION AND CURETTAGE OF UTERUS     "had fibroids" (03/15/2013)  . EXPLORATORY LAPAROTOMY     "had a growth on my intestines" (03/15/2013)  . HAMMER TOE SURGERY Right   . INSERT / REPLACE / REMOVE PACEMAKER  12/15/2006   Medtronic  . MITRAL VALVE REPLACEMENT  1998   St Jude mechanical; Dr. [name]  . PACEMAKER PLACEMENT  12/15/06   medtronic adapta for SSS  . PERMANENT PACEMAKER GENERATOR CHANGE N/A 07/24/2014   Procedure: PERMANENT PACEMAKER GENERATOR CHANGE;  Surgeon: [name], MD;  Location: [place] CATH LAB;  Service: Cardiovascular;  Laterality: N/A;  . PERSANTINE CARDIOLITE  08/07/03   mild inf. ischemia   . TEE WITHOUT CARDIOVERSION  09/23/2011   Procedure: TRANSESOPHAGEAL ECHOCARDIOGRAM (TEE);  Surgeon: [name];  Location: [facility] ENDOS[facility];  Service: Cardiovascular;  Laterality: N/A;  . TONSILLECTOMY    . TUBAL LIGATION    . US ECHOCARDIOGRAPHY  11/19/2011   EF 50-55%,RA mod to severely dilated,LA severely dilated,trace MR,small vegetation or mass on the MV,AOV mildly scleroticmild PI, RV pressure 40-100mmHg      Home Medications:  Prior to Admission medications   Medication Sig Start Date End Date Taking? Authorizing Provider  atorvastatin (LIPITOR) 40 MG tablet Take 1 tablet (40 mg total) by mouth daily at 6 PM. 10/13/17  Yes Croitoru, Mihai, MD  Biotin 1000 MCG tablet Take 1,000 mcg by mouth daily.   Yes [provider]  CARTIA XT 120 MG 24 hr capsule TAKE ONE CAPSULE BY MOUTH DAILY 09/16/17  Yes Croitoru, Mihai, MD  docusate sodium (COLACE) 100 MG capsule Take 200 mg by mouth 2 (two) times daily.    Yes [provider]  furosemide (LASIX) 40 MG tablet TAKE 1 AND 1/2 TABLETS BY MOUTH DAILY 03/01/18  Yes Croitoru, Mihai, MD  metoprolol tartrate (LOPRESSOR) 25 MG tablet Take 1 tablet (25 mg total) by mouth 2 (two) times daily. 11/10/16  Yes Croitoru, Mihai, MD  Multiple Vitamin (MULTIVITAMINS PO) Take 1 tablet by mouth daily.  Yes [provider]  nitroGLYCERIN (NITROSTAT) 0.4 MG SL tablet Place 1 tablet (0.4 mg total) under the tongue every 5 (five) minutes as needed for chest pain. 11/20/16  Yes Croitoru, Mihai, MD  Omega-3 Fatty Acids (FISH OIL) 1000 MG CAPS Take 1,000 mg by mouth daily.   Yes [provider]  polyethylene glycol (MIRALAX / GLYCOLAX) packet Take 17 g by mouth as needed (for constipation). Reported on 03/25/2016   Yes [provider]  potassium chloride SA (K-DUR,KLOR-CON) 20 MEQ tablet TAKE 1 TABLET BY MOUTH DAILY 11/01/17  Yes Croitoru, Mihai, MD  warfarin (COUMADIN) 5 MG tablet TAKE 1 TO 1 AND 1/2 TABLETS BY MOUTH DAILY AS DIRECTED Patient taking differently: TAKE 5MG  DAILY EXCEPT ON MONDAYS TAKE 7.5MG . 12/28/17  Yes Croitoru, Mihai, MD    Inpatient Medications: Scheduled Meds: . diltiazem  120 mg Oral Daily  . docusate sodium  200 mg Oral BID  . furosemide  40 mg Intravenous Q12H  . metoprolol tartrate  25 mg Oral BID  . multivitamin with minerals  1 tablet Oral Daily  . omega-3 acid ethyl esters  1 g Oral Daily  . sodium chloride  flush  3 mL Intravenous Q12H  . warfarin  5 mg Oral ONCE-1800  . Warfarin - Pharmacist Dosing Inpatient   Does not apply q1800   Continuous Infusions: . sodium chloride     PRN Meds: sodium chloride, albuterol, dextromethorphan-guaiFENesin, hydrALAZINE, morphine injection, nitroGLYCERIN, ondansetron (ZOFRAN) IV, polyethylene glycol, sodium chloride flush, zolpidem  Allergies:    Allergies  Allergen Reactions  . Codeine Nausea And Vomiting    Social History:   Social History   Socioeconomic History  . Marital status: Married    Spouse name: Not on file  . Number of children: Not on file  . Years of education: Not on file  . Highest education level: Not on file  Occupational History  . Not on file  Social Needs  . Financial resource strain: Not on file  . Food insecurity:    Worry: Not on file    Inability: Not on file  . Transportation needs:    Medical: Not on file    Non-medical: Not on file  Tobacco Use  . Smoking status: Never Smoker  . Smokeless tobacco: Never Used  Substance and Sexual Activity  . Alcohol use: No  . Drug use: No  . Sexual activity: Never  Lifestyle  . Physical activity:    Days per week: Not on file    Minutes per session: Not on file  . Stress: Not on file  Relationships  . Social connections:    Talks on phone: Not on file    Gets together: Not on file    Attends religious service: Not on file    Active member of club or organization: Not on file    Attends meetings of clubs or organizations: Not on file    Relationship status: Not on file  . Intimate partner violence:    Fear of current or ex partner: Not on file    Emotionally abused: Not on file    Physically abused: Not on file    Forced sexual activity: Not on file  Other Topics Concern  . Not on file  Social History Narrative  . Not on file    Family History:    Family History  Problem Relation Age of Onset  . Cancer Mother   . Stroke Father   . Cancer Sister   .  Leukemia Sister   . Healthy Brother   . Healthy Sister   . Diabetes Sister   . Healthy Brother   . Healthy Brother   . Heart attack Maternal Grandfather   . Kidney disease Daughter   . Stroke Brother      ROS:  Please see the history of present illness.   All other ROS reviewed and negative.     Physical Exam/Data:   Vitals:   03/31/18 0434 03/31/18 0500 03/31/18 0857 03/31/18 1100  BP: 105/67  (!) 110/55 111/75  Pulse: 62   63  Resp: 17   (!) 29  Temp: 98 F (36.7 C)  98.2 F (36.8 C)   TempSrc: Oral  Oral   SpO2: 100%  98% 99%  Weight:  124 lb 1.9 oz (56.3 kg)    Height:        Intake/Output Summary (Last 24 hours) at 03/31/2018 1206 Last data filed at 03/31/2018 1114 Gross per 24 hour  Intake 100 ml  Output 2250 ml  Net -2150 ml   Filed Weights   03/30/18 1624 03/31/18 0052 03/31/18 0500  Weight: 128 lb (58.1 kg) 124 lb 1.6 oz (56.3 kg) 124 lb 1.9 oz (56.3 kg)   Body mass index is 23.45 kg/m.  General:  Well nourished, well developed, in no acute distress HEENT: normal Neck: no JVD Vascular: No carotid bruits Cardiac:  Irregular rhythm, regular rate, + murmur, PPM in place left chest Lungs: wheezing throughout Abd: soft, nontender, no hepatomegaly  Ext: 1+ edema Musculoskeletal:  No deformities, BUE and BLE strength normal and equal Skin: warm and dry  Neuro:  CNs 2-12 intact, no focal abnormalities noted Psych:  Normal affect   EKG:  The EKG was personally reviewed and demonstrates:  Afib and pacing Telemetry:  Telemetry was personally reviewed and demonstrates:  Afib, pacing  Relevant CV Studies:  Echo pending  Echo 10/01/16: Study Conclusions - Left ventricle: Systolic function was normal. The estimated   ejection fraction was in the range of 50% to 55%. Wall motion was   normal; there were no regional wall motion abnormalities. Mitral   valve prosthesis and atrial fibrillation prevents evaluation of   LV diastolic function. - Ventricular  septum: Septal motion showed paradox. The contour   showed diastolic flattening. These changes are consistent with RV   volume overload. - Aortic valve: Right coronary cusp mobility was moderately   restricted. There was trivial regurgitation. - Mitral valve: A mechanical prosthesis was present and functioning   normally. - Left atrium: The atrium was severely dilated. - Right ventricle: The cavity size was moderately dilated. Systolic   function was mildly to moderately reduced. - Right atrium: The atrium was severely dilated. - Atrial septum: No defect or patent foramen ovale was identified. - Tricuspid valve: There was severe regurgitation. - Pulmonary arteries: Systolic pressure was moderately increased.   PA peak pressure: 55 mm Hg (S).  Laboratory Data:  Chemistry Recent Labs  Lab 03/30/18 1639 03/31/18 0713  NA 137 138  K 4.6 3.7  CL 106 101  CO2 21* 25  GLUCOSE 93 88  BUN 19 18  CREATININE 1.05* 1.08*  CALCIUM 9.5 9.5  GFRNONAA 50* 49*  GFRAA 58* 56*  ANIONGAP 10 12    Recent Labs  Lab 03/30/18 2051  PROT 7.2  ALBUMIN 3.0*  AST 137*  ALT 28  ALKPHOS 117  BILITOT 1.7*   Hematology Recent Labs  Lab 03/30/18 1639  WBC 4.7  RBC 3.07*  HGB 9.0*  HCT 28.1*  MCV 91.5  MCH 29.3  MCHC 32.0  RDW 34.0*  PLT 212   Cardiac Enzymes Recent Labs  Lab 03/30/18 1639 03/30/18 2051 03/31/18 0112 03/31/18 0713  TROPONINI 0.03* 0.03* 0.03* 0.04*   No results for input(s): TROPIPOC in the last 168 hours.  BNP Recent Labs  Lab 03/30/18 2051  BNP 1,780.8*    DDimer No results for input(s): DDIMER in the last 168 hours.  Radiology/Studies:  Dg Chest 2 View  Result Date: 03/30/2018 CLINICAL DATA:  77 y/o  F; pneumothorax on Monday. EXAM: CHEST - 2 VIEW COMPARISON:  03/28/2018 chest radiograph. FINDINGS: Stable cardiomegaly. Single lead pacemaker. Increased right mid and lower lung zone opacities. Central obstruction may be present. Small right effusion. No  pneumothorax. Clear left lung. Post median sternotomy. Bones are unremarkable. IMPRESSION: Increased right mid and lower lung zone opacities with stable small right effusion. Findings may represent right middle and lower lobe atelectasis or pneumonia. Central obstruction is possible. Follow-up per prior chest radiograph. Electronically Signed   By: Kristine Garbe M.D.   On: 03/30/2018 17:50   Dg Chest 2 View  Result Date: 03/28/2018 CLINICAL DATA:  Congestion and shortness of breath for several days. Assess for infiltrate. EXAM: CHEST - 2 VIEW COMPARISON:  Chest radiograph September 29, 2016 FINDINGS: RIGHT middle lobe collapse and possible underlying consolidation. Small RIGHT pleural effusion. LEFT lung is clear. Stable cardiomegaly. Calcified aortic arch. Status post median sternotomy for cardiac valve replacement. Single lead LEFT cardiac pacemaker. No pneumothorax. Soft tissue planes and included osseous structures are non suspicious. IMPRESSION: RIGHT middle lobe collapse and potential underlying consolidation. Small RIGHT pleural effusion. Followup PA and lateral chest X-ray is recommended in 3-4 weeks following trial of antibiotic therapy to ensure resolution and exclude underlying malignancy. Stable cardiomegaly. Aortic Atherosclerosis (ICD10-I70.0). These results will be called to the ordering clinician or representative by the Radiologist Assistant, and communication documented in the PACS or zVision Dashboard. Electronically Signed   By: Elon Alas M.D.   On: 03/28/2018 16:37   Ct Head Wo Contrast  Result Date: 03/30/2018 CLINICAL DATA:  Headache. EXAM: CT HEAD WITHOUT CONTRAST TECHNIQUE: Contiguous axial images were obtained from the base of the skull through the vertex without intravenous contrast. COMPARISON:  CT scan of March 15, 2013. FINDINGS: Brain: Mild chronic ischemic white matter disease is noted. Mild diffuse cortical atrophy is noted. New subcortical white matter low  density is noted in right cerebellar hemisphere consistent with infarction of indeterminate age. No evidence of mass lesion or hemorrhage is noted. Ventricular size is within normal limits. No mass effect or midline shift is noted. Vascular: Mild chronic ischemic white matter disease is noted. Skull: Normal. Negative for fracture or focal lesion. Sinuses/Orbits: Mild left maxillary sinusitis is noted. Other: None. IMPRESSION: Mild chronic ischemic white matter disease. Mild diffuse cortical atrophy. Subcortical white matter low density is noted in right cerebellar hemisphere consistent with infarction of indeterminate age; MRI is recommended for further evaluation. Electronically Signed   By: Marijo Conception, M.D.   On: 03/30/2018 19:50   Ct Chest W Contrast  Result Date: 03/30/2018 CLINICAL DATA:  Acute respiratory illness EXAM: CT CHEST WITH CONTRAST TECHNIQUE: Multidetector CT imaging of the chest was performed during intravenous contrast administration. CONTRAST:  2mL OMNIPAQUE IOHEXOL 300 MG/ML  SOLN COMPARISON:  Chest x-ray from earlier today FINDINGS: Cardiovascular: Severe cardiomegaly, especially the atria. Status post mitral valve replacement. Single chamber pacer  into the right ventricle. No pericardial effusion. Limited opacification of the aorta. There is prominent hepatic vein and hepatic cava reflux and distension that continues into the renal veins. Probable stenosis at the distal left brachiocephalic vein due to pacer lead, which accounts for multiple venous collaterals in the upper chest. Apparent eccentric filling defect at the branching of the left main pulmonary artery is extraluminal based on reformats. No indication of acute pulmonary embolism. Mediastinum/Nodes: Negative for adenopathy. Lungs/Pleura: Right-sided lung opacity reflects middle and lower lobe atelectasis or scarring with small right effusion. Mild septal thickening and patchy ground-glass opacity, likely mild edema. Upper  Abdomen: Venous reflux as noted above.  No acute finding. Musculoskeletal: Degenerative changes in the thoracic spine. No acute finding. IMPRESSION: 1. Radiographic findings reflect scarring/atelectasis in the right middle and lower lobes. 2. Marked cardiomegaly. Elevated right heart pressure with prominent distention and reflux of infra diaphragmatic veins. 3. Mild septal thickening and patchy ground-glass opacity, favor mild edema over infection. Small right pleural effusion. Electronically Signed   By: Monte Fantasia M.D.   On: 03/30/2018 19:50    Assessment and Plan:   1. Acute on chronic diastolic heart failure in the setting of URI and missing 2 days of lasix - echo pending to evaluate EF - BNP on admission 1781, CT chest with cardiomegaly and right heart strain - last echo with normal EF, but Afib prevented diastolic evaluation - she is diuresing on 40 mg IV lasix BID - overall negative 2L with 1.4 L urine output so far today - home lasix is 80 qAM and 60 at lunch - weight is 124 lbs from 128 lbs on admission - near her dry weight - given her lasix regimen at home, she will need higher lasix dose here - will increase to 80 qAM and 60 qPM - creatinine is stable, K is 3.7 - continue daily BMP   2. Elevated troponin - 0.03 --> 0.03 --> 0.03 --> 0.04 - trend is mild and flat, consistent with demand ischemia rather than ACS - EKG with Afib and pacing - she denies chest pain - will defer ischemic evaluation for now   3. Mechanical mitral valve - chronic anticoagulation with coumadin, INR 2.8 (goal 2.0-2.5)   4. Atrial fibrillation - This patients CHA2DS2-VASc Score and unadjusted Ischemic Stroke Rate (% per year) is equal to 10.8 % stroke rate/year from a score of 57 (age, female, CHF, HTN, stroke, CAD) - stable - continue coumadin with goal INR 2.0-2.5  For questions or updates, please contact [facility] Please consult [email or web address] for contact info under Cardiology/STEMI.    Signed, Ledora Bottcher, PA  03/31/2018 12:06 PM  The patient was seen, examined and discussed with Minette Brine , PA-C and I agree with the above.    77 y.o. 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  Her baseline weight is 130 lbs, she was maintaining on 20 mg lasix daily. CT chest with cardiomegaly, elevated right heart pressure, and edema with small  right pleural effusion.  BNP 1700.  Creatinine 1.0, hemoglobin 9.0, MCV 91. On physical exam she has elevated JVDs up to the jaws, she has a loud holosystolic murmur 3 out of 6, she has crackles at both bases and no lower extremity edema.  Plan: I would continue Lasix 40 mg IV twice daily until tomorrow and reevaluate.  Continue to follow creatinine, Minimal troponin elevation with flat trend, sec to demand ischemia, no need for ischemic workup. Her atrial fibrillation is rate controlled, INR is 2.5. Echocardiogram shows normal LVEF 60 to 65% with mild aortic regurgitation, elevated gradients across the mitral valve with mean of 6 mmHg, previously 5 mmHg, she is moderately dilated right ventricle with mildly reduced systolic function and severe tricuspid regurgitation and severe pulmonary hypertension with PASP of 66 mmHg.  Findings are unchanged from prior.  Ena Dawley, MD 03/31/2018

## 2018-03-31 NOTE — Progress Notes (Signed)
PHARMACIST - PHYSICIAN ORDER COMMUNICATION  CONCERNING: P&T Medication Policy on Herbal Medications  DESCRIPTION:  This patient's order for:  Biotin  has been noted.  This product(s) is classified as an "herbal" or natural product. Due to a lack of definitive safety studies or FDA approval, nonstandard manufacturing practices, plus the potential risk of unknown drug-drug interactions while on inpatient medications, the Pharmacy and Therapeutics Committee does not permit the use of "herbal" or natural products of this type within [facility].   ACTION TAKEN: The pharmacy department is unable to verify this order at this time and your patient has been informed of this safety policy. Please reevaluate patient's clinical condition at discharge and address if the herbal or natural product(s) should be resumed at that time.  Alycia Rossetti, PharmD, BCPS Pager: [phone] 12:05 AM

## 2018-03-31 NOTE — Progress Notes (Signed)
PROGRESS NOTE    Carrie [name]  SAY:[id number] DOB: August 02, 1941 DOA: 03/30/2018 PCP: Lucianne Lei, MD    Brief Narrative: Carrie [name] is a 77 y.o. 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   Assessment & Plan:   Principal Problem:   Acute on chronic diastolic (congestive) heart failure (HCC) Active Problems:   Pacemaker - single chamber Medtronic Adapta, 2008   Chronic anticoagulation, ( INR goal 2.0-2.5 due to history of subdural hematoma and liver hemorrhage)   Essential hypertension   Persistent atrial fibrillation (HCC)   PAH (pulmonary artery hypertension) (HCC)   CAD (coronary artery disease)   H/O mitral valve replacement with mechanical valve   Hemoptysis   Elevated troponin   CKD (chronic kidney disease), stage III (HCC)   HLD (hyperlipidemia)   Blurry vision   Diarrhea   Abnormal LFTs   Acute on chronic diastolic heart failure:  Admitted for IV lasix , 80 mg in the morning and 60 mg daily in the afternoon.  Repeat echocardiogram.  Mildly elevated troponins , flat trend, probably from CHF.  Strict intake and output, daily weights.  Resume home dose BB,  Cardiology consulted for recommendations.    Stage 3 CKD Creatinine at baseline.    Mild hemoptysis: resolved.     Persistent atrial fibrillation:  Rate controlled, on BB, on coumadin for anticoagulation.    Mechanical mitral valve:  On coumadin for anti coagulation. Goal of INR is between 2 aand 2.5 , due to history of sub dural hematoma and liver hemorrhage.    Sick sinus syndrome s/p pacemaker   Hypertension: well controlled.    Blurry vision:  Pt  reports improvement. And no other complaints.  Initial CT head showed Mild chronic ischemic white matter disease. Mild diffuse cortical atrophy. Subcortical white matter low density is noted in right cerebellar hemisphere consistent with infarction of indeterminate age;Marland Kitchen Unable to do an MRI because of Pacemaker. Discussed with Dr Leonel Ramsay with Neurology , recommended getting a repeat CT head without contrast in am.  Meanwhile carotid duplex done does not show significant stenosis.  Echocardiogram ordered.  Therapy evaluation with PT and OT.  Pt denies any issues with swallowing.   DVT prophylaxis:coumadin. Code Status: Full code.  Family Communication: None at bedside.  Disposition Plan:  Pending clinical improvement and evaluation by PT.    Consultants:   Cardiology    Procedures: Echocardiogram. And carotid duplex.   Antimicrobials: one dose of Rocephin and Zithromax.   Subjective: Reports sob has improved. Blurry vision improved.  No chest pain, no nausea, vomiting or abdominal pain.    Objective: Vitals:   03/31/18 0857 03/31/18 1100 03/31/18 1300 03/31/18 1513  BP: (!) 110/55 111/75  120/68  Pulse:  63  (!) 58  Resp:  (!) 29    Temp: 98.2 F (36.8 C)  98.2 F (36.8 C) 98.2 F (36.8 C)  TempSrc: Oral  Oral Oral  SpO2: 98% 99%  98%  Weight:      Height:        Intake/Output Summary (Last 24 hours) at 03/31/2018 1519 Last data filed at 03/31/2018 1114 Gross per 24 hour  Intake 100 ml  Output 2250 ml  Net -2150 ml  Filed Weights   03/30/18 1624 03/31/18 0052 03/31/18 0500  Weight: 58.1 kg (128 lb) 56.3 kg (124 lb 1.6 oz) 56.3 kg (124 lb 1.9 oz)    Examination:  General exam: Appears calm and comfortable  Respiratory system: Clear to auscultation. Respiratory effort normal. Cardiovascular system: S1 & S2 heard, irregular, murmer present, pedal edema+ Gastrointestinal system: Abdomen is nondistended, soft and nontender. No organomegaly or masses felt.  Normal bowel sounds heard. Central nervous system: Alert and oriented. No focal neurological deficits. Extremities: Symmetric 5 x 5 power. Skin: No rashes, lesions or ulcers Psychiatry:  Mood & affect appropriate.     Data Reviewed: I have personally reviewed following labs and imaging studies  CBC: Recent Labs  Lab 03/30/18 1639  WBC 4.7  HGB 9.0*  HCT 28.1*  MCV 91.5  PLT 202   Basic Metabolic Panel: Recent Labs  Lab 03/30/18 1639 03/31/18 0713  NA 137 138  K 4.6 3.7  CL 106 101  CO2 21* 25  GLUCOSE 93 88  BUN 19 18  CREATININE 1.05* 1.08*  CALCIUM 9.5 9.5   GFR: Estimated Creatinine Clearance: 33.4 mL/min (A) (by C-G formula based on SCr of 1.08 mg/dL (H)). Liver Function Tests: Recent Labs  Lab 03/30/18 2051  AST 137*  ALT 28  ALKPHOS 117  BILITOT 1.7*  PROT 7.2  ALBUMIN 3.0*   No results for input(s): LIPASE, AMYLASE in the last 168 hours. No results for input(s): AMMONIA in the last 168 hours. Coagulation Profile: Recent Labs  Lab 03/30/18 1639 03/31/18 0713  INR 2.80 2.59   Cardiac Enzymes: Recent Labs  Lab 03/30/18 1639 03/30/18 2051 03/31/18 0112 03/31/18 0713 03/31/18 1204  TROPONINI 0.03* 0.03* 0.03* 0.04* 0.04*   BNP (last 3 results) No results for input(s): PROBNP in the last 8760 hours. HbA1C: Recent Labs    03/31/18 0112  HGBA1C 4.7*   CBG: No results for input(s): GLUCAP in the last 168 hours. Lipid Profile: Recent Labs    03/31/18 0112  CHOL 87  HDL 31*  LDLCALC 47  TRIG 46  CHOLHDL 2.8   Thyroid Function Tests: No results for input(s): TSH, T4TOTAL, FREET4, T3FREE, THYROIDAB in the last 72 hours. Anemia Panel: No results for input(s): VITAMINB12, FOLATE, FERRITIN, TIBC, IRON, RETICCTPCT in the last 72 hours. Sepsis Labs: Recent Labs  Lab 03/30/18 1915 03/30/18 2053  LATICACIDVEN 1.31 0.88    Recent Results (from the past 240 hour(s))  Blood culture (routine x 2)     Status: None (Preliminary result)     Collection Time: 03/30/18  6:59 PM  Result Value Ref Range Status   Specimen Description BLOOD LEFT FOREARM  Final   Special Requests   Final    BOTTLES DRAWN AEROBIC AND ANAEROBIC Blood Culture adequate volume   Culture   Final    NO GROWTH < 24 HOURS Performed at [facility], [place] [address] [place], [place] [address]    Report Status PENDING  Incomplete  MRSA PCR Screening     Status: None   Collection Time: 03/31/18 12:54 AM  Result Value Ref Range Status   MRSA by PCR NEGATIVE NEGATIVE Final    Comment:        The GeneXpert MRSA Assay (FDA approved for NASAL specimens only), is one component of a comprehensive MRSA colonization surveillance program. It is not intended to diagnose MRSA infection nor to guide or monitor treatment for MRSA infections. Performed at [facility], [place]  [address] [place], [place] [address]          Radiology Studies: Dg Chest 2 View  Result Date: 03/30/2018 CLINICAL DATA:  77 y/o  F; pneumothorax on Monday. EXAM: CHEST - 2 VIEW COMPARISON:  03/28/2018 chest radiograph. FINDINGS: Stable cardiomegaly. Single lead pacemaker. Increased right mid and lower lung zone opacities. Central obstruction may be present. Small right effusion. No pneumothorax. Clear left lung. Post median sternotomy. Bones are unremarkable. IMPRESSION: Increased right mid and lower lung zone opacities with stable small right effusion. Findings may represent right middle and lower lobe atelectasis or pneumonia. Central obstruction is possible. Follow-up per prior chest radiograph. Electronically Signed   By: Kristine Garbe M.D.   On: 03/30/2018 17:50   Ct Head Wo Contrast  Result Date: 03/30/2018 CLINICAL DATA:  Headache. EXAM: CT HEAD WITHOUT CONTRAST TECHNIQUE: Contiguous axial images were obtained from the base of the skull through the vertex without intravenous contrast. COMPARISON:  CT scan of March 15, 2013. FINDINGS: Brain: Mild chronic ischemic  white matter disease is noted. Mild diffuse cortical atrophy is noted. New subcortical white matter low density is noted in right cerebellar hemisphere consistent with infarction of indeterminate age. No evidence of mass lesion or hemorrhage is noted. Ventricular size is within normal limits. No mass effect or midline shift is noted. Vascular: Mild chronic ischemic white matter disease is noted. Skull: Normal. Negative for fracture or focal lesion. Sinuses/Orbits: Mild left maxillary sinusitis is noted. Other: None. IMPRESSION: Mild chronic ischemic white matter disease. Mild diffuse cortical atrophy. Subcortical white matter low density is noted in right cerebellar hemisphere consistent with infarction of indeterminate age; MRI is recommended for further evaluation. Electronically Signed   By: Marijo Conception, M.D.   On: 03/30/2018 19:50   Ct Chest W Contrast  Result Date: 03/30/2018 CLINICAL DATA:  Acute respiratory illness EXAM: CT CHEST WITH CONTRAST TECHNIQUE: Multidetector CT imaging of the chest was performed during intravenous contrast administration. CONTRAST:  27mL OMNIPAQUE IOHEXOL 300 MG/ML  SOLN COMPARISON:  Chest x-ray from earlier today FINDINGS: Cardiovascular: Severe cardiomegaly, especially the atria. Status post mitral valve replacement. Single chamber pacer into the right ventricle. No pericardial effusion. Limited opacification of the aorta. There is prominent hepatic vein and hepatic cava reflux and distension that continues into the renal veins. Probable stenosis at the distal left brachiocephalic vein due to pacer lead, which accounts for multiple venous collaterals in the upper chest. Apparent eccentric filling defect at the branching of the left main pulmonary artery is extraluminal based on reformats. No indication of acute pulmonary embolism. Mediastinum/Nodes: Negative for adenopathy. Lungs/Pleura: Right-sided lung opacity reflects middle and lower lobe atelectasis or scarring with  small right effusion. Mild septal thickening and patchy ground-glass opacity, likely mild edema. Upper Abdomen: Venous reflux as noted above.  No acute finding. Musculoskeletal: Degenerative changes in the thoracic spine. No acute finding. IMPRESSION: 1. Radiographic findings reflect scarring/atelectasis in the right middle and lower lobes. 2. Marked cardiomegaly. Elevated right heart pressure with prominent distention and reflux of infra diaphragmatic veins. 3. Mild septal thickening and patchy ground-glass opacity, favor mild edema over infection. Small right pleural effusion. Electronically Signed   By: Monte Fantasia M.D.   On: 03/30/2018 19:50        Scheduled Meds: . diltiazem  120 mg Oral Daily  . docusate sodium  200 mg Oral BID  . furosemide  60 mg Intravenous QPC lunch  . [START ON 04/01/2018] furosemide  80 mg Intravenous BH-q7a  .  metoprolol tartrate  25 mg Oral BID  . multivitamin with minerals  1 tablet Oral Daily  . omega-3 acid ethyl esters  1 g Oral Daily  . potassium chloride  20 mEq Oral Daily  . sodium chloride flush  3 mL Intravenous Q12H  . warfarin  5 mg Oral ONCE-1800  . Warfarin - Pharmacist Dosing Inpatient   Does not apply q1800   Continuous Infusions: . sodium chloride       LOS: 1 day    Time spent: 35 min    Hosie Poisson, MD [facility] Pager [phone]  If 7PM-7AM, please contact night-coverage [email or web address] Password [facility] 03/31/2018, 3:19 PM

## 2018-03-31 NOTE — Care Management Note (Signed)
Case Management Note  Patient Details  Name: Carrie [name] MRN: [id number] Date of Birth: 21-Oct-1940  Subjective/Objective:        Pt admitted with acute on chronic CHF. She is from home with spouse.             Action/Plan: Plan is for patient to d/c home when medically stable. CM following for d/c needs.   Expected Discharge Date:                  Expected Discharge Plan:  Home/Self Care  In-House Referral:     Discharge planning Services     Post Acute Care Choice:    Choice offered to:     DME Arranged:    DME Agency:     HH Arranged:    HH Agency:     Status of Service:  In process, will continue to follow  If discussed at Long Length of Stay Meetings, dates discussed:    Additional Comments:  Pollie Friar, RN 03/31/2018, 2:58 PM

## 2018-03-31 NOTE — Progress Notes (Signed)
*  PRELIMINARY RESULTS* Vascular Ultrasound Carotid Duplex (Doppler) has been completed.   Findings suggest 1-39% internal carotid artery stenosis bilaterally. The right vertebral artery is patent with atypical antegrade flow, suggestive of possible proximal obstruction. The left vertebral artery is patent with antegrade flow.  03/31/2018 2:20 PM Maudry Mayhew, BS, RVT, RDCS, RDMS

## 2018-03-31 NOTE — ED Notes (Signed)
Report attempted x 1

## 2018-04-01 ENCOUNTER — Inpatient Hospital Stay (HOSPITAL_COMMUNITY): Payer: Medicare Other

## 2018-04-01 DIAGNOSIS — R945 Abnormal results of liver function studies: Secondary | ICD-10-CM

## 2018-04-01 DIAGNOSIS — R05 Cough: Secondary | ICD-10-CM

## 2018-04-01 DIAGNOSIS — I2510 Atherosclerotic heart disease of native coronary artery without angina pectoris: Secondary | ICD-10-CM

## 2018-04-01 DIAGNOSIS — R042 Hemoptysis: Secondary | ICD-10-CM

## 2018-04-01 LAB — BASIC METABOLIC PANEL
ANION GAP: 8 (ref 5–15)
BUN: 20 mg/dL (ref 8–23)
CHLORIDE: 104 mmol/L (ref 98–111)
CO2: 25 mmol/L (ref 22–32)
Calcium: 9.3 mg/dL (ref 8.9–10.3)
Creatinine, Ser: 1.08 mg/dL — ABNORMAL HIGH (ref 0.44–1.00)
GFR calc Af Amer: 56 mL/min — ABNORMAL LOW (ref >60)
GFR, EST NON AFRICAN AMERICAN: 49 mL/min — AB (ref >60)
Glucose, Bld: 117 mg/dL — ABNORMAL HIGH (ref 70–99)
POTASSIUM: 3.8 mmol/L (ref 3.5–5.1)
Sodium: 137 mmol/L (ref 135–145)

## 2018-04-01 LAB — HEPATITIS PANEL, ACUTE
HCV Ab: 0.2 s/co ratio (ref 0.0–0.9)
Hep A IgM: NEGATIVE
Hep B C IgM: NEGATIVE
Hepatitis B Surface Ag: NEGATIVE

## 2018-04-01 LAB — PROTIME-INR
INR: 2.13
Prothrombin Time: 23.6 seconds — ABNORMAL HIGH (ref 11.4–15.2)

## 2018-04-01 MED ORDER — AMOXICILLIN-POT CLAVULANATE 875-125 MG PO TABS
1.0000 | ORAL_TABLET | Freq: Two times a day (BID) | ORAL | Status: DC
  Administered 2018-04-01: 1 via ORAL
  Filled 2018-04-01: qty 1

## 2018-04-01 MED ORDER — FUROSEMIDE 20 MG PO TABS: 20.0000 mg | ORAL_TABLET | Freq: Every day | ORAL | 1 refills | Status: DC

## 2018-04-01 MED ORDER — AMOXICILLIN-POT CLAVULANATE 875-125 MG PO TABS: 1.0000 | ORAL_TABLET | Freq: Two times a day (BID) | ORAL | 0 refills | Status: DC

## 2018-04-01 MED ORDER — WARFARIN SODIUM 5 MG PO TABS: 5.0000 mg | ORAL_TABLET | Freq: Once | ORAL | Status: DC

## 2018-04-01 MED ORDER — FUROSEMIDE 20 MG PO TABS
20.0000 mg | ORAL_TABLET | Freq: Every day | ORAL | Status: DC
  Administered 2018-04-01: 20 mg via ORAL
  Filled 2018-04-01: qty 1

## 2018-04-01 MED ORDER — DM-GUAIFENESIN ER 30-600 MG PO TB12: 1.0000 | ORAL_TABLET | Freq: Two times a day (BID) | ORAL | 0 refills | Status: DC | PRN

## 2018-04-01 NOTE — Evaluation (Signed)
Physical Therapy Evaluation Patient Details Name: Carrie [name] MRN: [id number] DOB: 01/26/1941 Today's Date: 04/01/2018   History of Present Illness  Pt admitted 03/30/18 with acute on chronic CHF. PMH: HTN, CVA, MVR, SDH, CAD, pacemaker, CKD. CT shows mild chronic ischemic white matter disease and subcortical white matter low density noted in right cerebellar hemipshere consistent with infarction of indeterminate age.  Clinical Impression  Pt admitted with above diagnosis. Pt currently with functional limitations due to the deficits listed below (see PT Problem List). Patient independent at baseline but does have history of a fall down the steps one month ago. Patient ambulating 200 feet with walker and supervision. Demonstrates increased independence with walker compared to no assistive device; recommended Rollator or RW at home for all mobility and her husband's supervision when negotiating stairs. Pt will benefit from skilled PT to increase their independence and safety with mobility to allow discharge to the venue listed below.       Follow Up Recommendations Home health PT;Supervision for mobility/OOB    Equipment Recommendations  None recommended by PT    Recommendations for Other Services       Precautions / Restrictions Precautions Precautions: Fall Precaution Comments: pt with hx of falls Restrictions Weight Bearing Restrictions: No      Mobility  Bed Mobility Overal bed mobility: Needs Assistance Bed Mobility: Supine to Sit     Supine to sit: Supervision;HOB elevated     General bed mobility comments: OOB in recliner  Transfers Overall transfer level: Needs assistance Equipment used: Rolling walker (2 wheeled) Transfers: Sit to/from Stand Sit to Stand: Supervision         General transfer comment: max cues for hand placement with RW. practiced eccentric control when transitioning from stand to sit  Ambulation/Gait Ambulation/Gait assistance: Min  guard;Supervision Gait Distance (Feet): 200 Feet Assistive device: Rolling walker (2 wheeled);None Gait Pattern/deviations: Step-through pattern;Narrow base of support;Scissoring;Drifts right/left     General Gait Details: patient initially ambulated in room without assistive device with noted scissoring and reaching for furniture, requiring min guard assist. Able to progress to supervision with use of walker for external support. cues for walker proximity.  Stairs            Wheelchair Mobility    Modified Rankin (Stroke Patients Only)       Balance Overall balance assessment: Needs assistance   Sitting balance-Leahy Scale: Fair Sitting balance - Comments: posterior lean with donning front opening gown Postural control: Posterior lean   Standing balance-Leahy Scale: Fair Standing balance comment: reliant on B UE support                             Pertinent Vitals/Pain Pain Assessment: No/denies pain    Home Living Family/patient expects to be discharged to:: Private residence Living Arrangements: Spouse/significant other Available Help at Discharge: Family;Available 24 hours/day Type of Home: House Home Access: Stairs to enter Entrance Stairs-Rails: None Entrance Stairs-Number of Steps: 1 Home Layout: Multi-level;Able to live on main level with bedroom/bathroom(trilevel) Home Equipment: Gilford Rile - 2 wheels;Walker - 4 wheels;Cane - single point;Grab bars - tub/shower      Prior Function Level of Independence: Needs assistance   Gait / Transfers Assistance Needed: walked without a device, but per pt, her husband said she needed to use a device  ADL's / Homemaking Assistance Needed: independent in self care, husband and pt work together on IADL  Comments: History of a fall down  her steps one month ago     Hand Dominance   Dominant Hand: Right    Extremity/Trunk Assessment   Upper Extremity Assessment Upper Extremity Assessment: Defer to OT  evaluation    Lower Extremity Assessment Lower Extremity Assessment: Generalized weakness       Communication   Communication: No difficulties  Cognition Arousal/Alertness: Awake/alert Behavior During Therapy: WFL for tasks assessed/performed;Flat affect Overall Cognitive Status: Within Functional Limits for tasks assessed                                 General Comments: one word responses       General Comments      Exercises     Assessment/Plan    PT Assessment Patient needs continued PT services  PT Problem List Decreased strength;Decreased activity tolerance;Decreased balance;Decreased mobility;Decreased cognition;Decreased safety awareness       PT Treatment Interventions      PT Goals (Current goals can be found in the Care Plan section)  Acute Rehab PT Goals Patient Stated Goal: to go home PT Goal Formulation: With patient Time For Goal Achievement: 04/15/18 Potential to Achieve Goals: Good    Frequency Min 3X/week   Barriers to discharge        Co-evaluation               AM-PAC PT "6 Clicks" Daily Activity  Outcome Measure Difficulty turning over in bed (including adjusting bedclothes, sheets and blankets)?: None Difficulty moving from lying on back to sitting on the side of the bed? : None Difficulty sitting down on and standing up from a chair with arms (e.g., wheelchair, bedside commode, etc,.)?: A Little Help needed moving to and from a bed to chair (including a wheelchair)?: A Little Help needed walking in hospital room?: A Little Help needed climbing 3-5 steps with a railing? : A Little 6 Click Score: 20    End of Session Equipment Utilized During Treatment: Gait belt Activity Tolerance: Patient tolerated treatment well Patient left: in chair;with call bell/phone within reach Nurse Communication: Mobility status PT Visit Diagnosis: Unsteadiness on feet (R26.81);Muscle weakness (generalized) (M62.81)    Time:  1104-1130 PT Time Calculation (min) (ACUTE ONLY): 26 min   Charges:   PT Evaluation $PT Eval Moderate Complexity: 1 Mod PT Treatments $Gait Training: 8-22 mins   PT G Codes:       Ellamae Sia, PT, DPT Acute Rehabilitation Services  Pager: [phone]   Willy Eddy 04/01/2018, 11:54 AM

## 2018-04-01 NOTE — Progress Notes (Signed)
[place] for coumadin Indication: MVR  Allergies  Allergen Reactions  . Codeine Nausea And Vomiting    Patient Measurements: Height: 5\' 1"  (154.9 cm) Weight: 118 lb 1.6 oz (53.6 kg) IBW/kg (Calculated) : 47.8   Vital Signs: Temp: 98.3 F (36.8 C) (07/12 0809) Temp Source: Oral (07/12 0809) BP: 105/67 (07/12 0957) Pulse Rate: 82 (07/12 0957)  Labs: Recent Labs    03/30/18 1639  03/31/18 0112 03/31/18 0713 03/31/18 1204 04/01/18 0338  HGB 9.0*  --   --   --   --   --   HCT 28.1*  --   --   --   --   --   PLT 212  --   --   --   --   --   LABPROT 29.3*  --   --  27.6*  --  23.6*  INR 2.80  --   --  2.59  --  2.13  CREATININE 1.05*  --   --  1.08*  --  1.08*  TROPONINI 0.03*   < > 0.03* 0.04* 0.04*  --    < > = values in this interval not displayed.    Estimated Creatinine Clearance: 33.4 mL/min (A) (by C-G formula based on SCr of 1.08 mg/dL (H)).   Medical History: Past Medical History:  Diagnosis Date  . Anticoagulated on Coumadin    for mech valve and atrial fib  goal 2.0-2.5  . Anxiety   . Arthritis    "right shoulder" (03/15/2013)  . Atrial fibrillation ([place])   . Atrial flutter ([place])   . CAD (coronary artery disease) 08/11/2016  . CHF (congestive heart failure) ([place])   . Chronic combined systolic and diastolic CHF (congestive heart failure) ([place])    a. 02/2016 Echo: EF 45-50%.  . Diverticula of colon   . Exertional shortness of breath   . GERD (gastroesophageal reflux disease)   . Heart murmur   . Hemorrhage intraabdominal 03/17/2012  . History of blood transfusion    "once" (03/15/2013)  . Hyperlipidemia   . Hypertension   . Liver hemorrhage   . Migraines   . 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 Bi-leaflet St. Jude mechanical prosthesis; b. 02/2016 Echo: EF 45-50%, some degree of MR, sev dil LA/RA, sev TR.  . NSTEMI (non-ST elevated myocardial infarction) ([place])    a. 02/2016 elev trop/Cath:  nonobs dzs,   . Pacemaker    medtronic adapta  . Pneumonia 2009   resolved.? OPD Rx  . Severe tricuspid regurgitation    a. 02/2016 Echo: Ef 45-50%, sev TR, PASP 68mmHg.  . Sick sinus syndrome ([place])    Dr Cristopher [place]. EP study negative. Pacemaker 12/15/06 Medtronic  . Stroke [facility])    "they say I had a stroke last year" denies residual on 03/15/2013  . Subdural hematoma [facility])    Assessment: 77 yo lady to continue coumadin for h/o MVR. She also has h/o of SDH and liver hemorrhage, INR goal 2.0-2.5 per card. Hemoptysis noted on admission - has since improved.    PTA dose 5mg  daily, except 7.5mg  on Monday  INR 2.8 on admission, trended down to 2.13 yesterday, received warfarin 5 mg yesterday. Hgb 9, plt 212 on last check on 7/10. Received 1 dose of azithromycin 500 mg on 7/10. No s/sx of bleeding documented.    Goal of Therapy:  INR 2-2.5 Monitor platelets by anticoagulation protocol: Yes   Plan:  Warfarin 5 mg today  Daily PT/INR  Doylene Canard, PharmD Clinical Pharmacist  Pager: [phone] Phone: [phone] 04/01/2018  11:27 AM

## 2018-04-01 NOTE — Consult Note (Signed)
            [facility] CM Primary Care Navigator  04/01/2018  Carrie [name] May 29, 1941 [id number]   Went to see patient at the bedside to identify possible discharge needs butshe wasalreadydischarged per staff. Patient was sent home with home health servicestoday.  Per chart review, patient presented with cough, shortness breath, hemoptysis, bilateral leg edema and blurry vision. (acute on chronic diastolic (congestive) heart failure)  Primary care provider's office is listed as providing transition of care (TOC) follow-up.  Patient has discharge instruction to follow-up withprimary care provider in 1 week and cardiology on 04/22/18.   For additional questions please contact:  Edwena Felty A. Evan Osburn, BSN, RN-BC [facility] PRIMARY CARE Navigator Cell: [phone]

## 2018-04-01 NOTE — Evaluation (Signed)
Occupational Therapy Evaluation Patient Details Name: Carrie [name] MRN: [id number] DOB: [birth year] Today's Date: 04/01/2018    History of Present Illness Pt admitted 03/30/18 with acute on chronic CHF. PMH: HTN, CVA, MVR, SDH, CAD, pacemaker, CKD.   Clinical Impression   Pt was ambulating with a device and modified independent in self care. Pt and husband worked together on IADL. Pt admits to recent fall on the stairs at church and that husband has concerns about pt's safety. Pt presents with generalized weakness, decreased activity tolerance and poor standing balance. Educated pt in importance of using RW at home and having husband supervise her tub transfers. Recommended pt sit to shower on shower seat. Pt verbalizing understanding. Recommending HHOT upon discharge. Will follow acutely.    Follow Up Recommendations  Home health OT;Supervision/Assistance - 24 hour    Equipment Recommendations  Tub/shower seat    Recommendations for Other Services       Precautions / Restrictions Precautions Precautions: Fall Precaution Comments: pt with hx of falls      Mobility Bed Mobility Overal bed mobility: Needs Assistance Bed Mobility: Supine to Sit     Supine to sit: Supervision;HOB elevated     General bed mobility comments: increased time and use of rail  Transfers Overall transfer level: Needs assistance Equipment used: Rolling walker (2 wheeled) Transfers: Sit to/from Stand Sit to Stand: Min guard         General transfer comment: cues for hand placement with RW    Balance Overall balance assessment: Needs assistance   Sitting balance-Leahy Scale: Fair Sitting balance - Comments: posterior lean with donning front opening gown Postural control: Posterior lean   Standing balance-Leahy Scale: Poor Standing balance comment: reliant on B UE support                           ADL either performed or assessed with clinical judgement   ADL Overall ADL's :  Needs assistance/impaired Eating/Feeding: Set up;Sitting Eating/Feeding Details (indicate cue type and reason): assist to open containers Grooming: Wash/dry hands;Standing;Min guard   Upper Body Bathing: Set up;Cueing for UE precautions   Lower Body Bathing: Minimal assistance;Sit to/from stand   Upper Body Dressing : Set up;Sitting   Lower Body Dressing: Min guard;Sit to/from stand   Toilet Transfer: Minimal assistance;Ambulation;RW   Toileting- Water quality scientist and Hygiene: Min guard;Sit to/from stand       Functional mobility during ADLs: Minimal assistance;Rolling walker General ADL Comments: Pt with one LOB with ambulation with RW, requiring min assist to correct.     Vision Patient Visual Report: No change from baseline       Perception     Praxis      Pertinent Vitals/Pain Pain Assessment: No/denies pain     Hand Dominance Right   Extremity/Trunk Assessment Upper Extremity Assessment Upper Extremity Assessment: Overall WFL for tasks assessed(R hand with contracted 3rd and 4th fingers)   Lower Extremity Assessment Lower Extremity Assessment: Generalized weakness       Communication Communication Communication: No difficulties   Cognition Arousal/Alertness: Awake/alert Behavior During Therapy: WFL for tasks assessed/performed Overall Cognitive Status: Within Functional Limits for tasks assessed                                 General Comments: short answers   General Comments       Exercises     Shoulder  Instructions      Home Living Family/patient expects to be discharged to:: Private residence Living Arrangements: Spouse/significant other Available Help at Discharge: Family;Available 24 hours/day Type of Home: House Home Access: Stairs to enter [employer] of Steps: 1 Entrance Stairs-Rails: None Home Layout: Multi-level(trilevel)     Bathroom Shower/Tub: Teacher, early years/pre: Handicapped  height     Home Equipment: Environmental consultant - 2 wheels;Walker - 4 wheels;Cane - single point;Grab bars - tub/shower          Prior Functioning/Environment Level of Independence: Needs assistance  Gait / Transfers Assistance Needed: walked without a device, but per pt, her husband said she needed to use a device ADL's / Homemaking Assistance Needed: independent in self care, husband and pt work together on IADL            OT Problem List: Decreased strength;Decreased activity tolerance;Impaired balance (sitting and/or standing);Decreased knowledge of use of DME or AE;Decreased safety awareness      OT Treatment/Interventions: Self-care/ADL training;DME and/or AE instruction;Patient/family education;Balance training    OT Goals(Current goals can be found in the care plan section) Acute Rehab OT Goals Patient Stated Goal: to go home OT Goal Formulation: With patient Time For Goal Achievement: 04/15/18 Potential to Achieve Goals: Good ADL Goals Pt Will Perform Grooming: with supervision;standing Pt Will Perform Lower Body Bathing: with supervision;sit to/from stand Pt Will Perform Lower Body Dressing: with supervision;sit to/from stand Pt Will Transfer to Toilet: with supervision;ambulating Pt Will Perform Toileting - Clothing Manipulation and hygiene: with supervision;sit to/from stand Pt Will Perform Tub/Shower Transfer: Tub transfer;with supervision;ambulating;shower seat;grab bars;rolling walker  OT Frequency: Min 2X/week   Barriers to D/C:            Co-evaluation              AM-PAC PT "6 Clicks" Daily Activity     Outcome Measure Help from another person eating meals?: A Little Help from another person taking care of personal grooming?: A Little Help from another person toileting, which includes using toliet, bedpan, or urinal?: A Little Help from another person bathing (including washing, rinsing, drying)?: A Little Help from another person to put on and taking off  regular upper body clothing?: A Little Help from another person to put on and taking off regular lower body clothing?: A Little 6 Click Score: 18   End of Session Equipment Utilized During Treatment: Gait belt;Rolling walker Nurse Communication: Mobility status  Activity Tolerance: Patient tolerated treatment well Patient left: in chair;with call bell/phone within reach  OT Visit Diagnosis: Unsteadiness on feet (R26.81);Other abnormalities of gait and mobility (R26.89);Muscle weakness (generalized) (M62.81);History of falling (Z91.81)                Time: 7026-3785 OT Time Calculation (min): 29 min Charges:  OT General Charges $OT Visit: 1 Visit OT Evaluation $OT Eval Moderate Complexity: 1 Mod OT Treatments $Self Care/Home Management : 8-22 mins G-Codes:     April 25, 2018 Nestor Lewandowsky, OTR/L Pager: [phone]  Werner Lean, Haze Boyden 04/25/18, 11:13 AM

## 2018-04-01 NOTE — Progress Notes (Addendum)
Progress Note  Patient Name: Carrie [name] Date of Encounter: 04/01/2018  Primary Cardiologist: [name], MD   Subjective   Pt feeling well today. Reports breathing is improved. Denies chest pain or palpitations.   Inpatient Medications    Scheduled Meds: . diltiazem  120 mg Oral Daily  . docusate sodium  200 mg Oral BID  . furosemide  60 mg Intravenous QPC lunch  . furosemide  80 mg Intravenous BH-q7a  . metoprolol tartrate  25 mg Oral BID  . multivitamin with minerals  1 tablet Oral Daily  . omega-3 acid ethyl esters  1 g Oral Daily  . potassium chloride  20 mEq Oral Daily  . sodium chloride flush  3 mL Intravenous Q12H  . Warfarin - Pharmacist Dosing Inpatient   Does not apply q1800   Continuous Infusions: . sodium chloride     PRN Meds: sodium chloride, albuterol, dextromethorphan-guaiFENesin, hydrALAZINE, morphine injection, nitroGLYCERIN, ondansetron (ZOFRAN) IV, polyethylene glycol, sodium chloride flush, zolpidem   Vital Signs    Vitals:   04/01/18 0348 04/01/18 0600 04/01/18 0605 04/01/18 0809  BP: (!) 96/55 (!) 107/52 (!) 102/58 (!) 93/57  Pulse: 68 99  78  Resp: 13 (!) 31  14  Temp: 98.3 F (36.8 C)   98.3 F (36.8 C)  TempSrc: Oral   Oral  SpO2: 97% 99%  96%  Weight: 118 lb 1.6 oz (53.6 kg)     Height:        Intake/Output Summary (Last 24 hours) at 04/01/2018 0918 Last data filed at 04/01/2018 0356 Gross per 24 hour  Intake 225 ml  Output 1775 ml  Net -1550 ml   Filed Weights   03/31/18 0052 03/31/18 0500 04/01/18 0348  Weight: 124 lb 1.6 oz (56.3 kg) 124 lb 1.9 oz (56.3 kg) 118 lb 1.6 oz (53.6 kg)    Physical Exam   General: Frail, elderly, NAD Skin: Warm, dry, intact  Head: Normocephalic, atraumatic, clear, moist mucus membranes. Neck: Negative for carotid bruits. No JVD Lungs: Diminished in right LL. No wheezes, rales, or rhonchi. Breathing is unlabored. Cardiovascular: Irregularly irregular with S1 S2. No murmurs, rubs or  gallops Abdomen: Soft, non-tender, non-distended with normoactive bowel sounds. No obvious abdominal masses. MSK: Strength and tone appear normal for age. 5/5 in all extremities Extremities: Mild 1+ LE edema. No clubbing or cyanosis. DP/PT pulses 1+ bilaterally Neuro: Alert and oriented. 77 MAE spontaneously. Psych: Responds to questions appropriately with normal affect.    Labs    Chemistry Recent Labs  Lab 03/30/18 1639 03/30/18 2051 03/31/18 0713 04/01/18 0338  NA 137  --  138 137  K 4.6  --  3.7 3.8  CL 106  --  101 104  CO2 21*  --  25 25  GLUCOSE 93  --  88 117*  BUN 19  --  18 20  CREATININE 1.05*  --  1.08* 1.08*  CALCIUM 9.5  --  9.5 9.3  PROT  --  7.2  --   --   ALBUMIN  --  3.0*  --   --   AST  --  137*  --   --   ALT  --  28  --   --   ALKPHOS  --  117  --   --   BILITOT  --  1.7*  --   --   GFRNONAA 50*  --  49* 49*  GFRAA 58*  --  56* 56*  ANIONGAP 10  --  12 8    Hematology Recent Labs  Lab 03/30/18 1639  WBC 4.7  RBC 3.07*  HGB 9.0*  HCT 28.1*  MCV 91.5  MCH 29.3  MCHC 32.0  RDW 34.0*  PLT 212   Cardiac Enzymes Recent Labs  Lab 03/30/18 2051 03/31/18 0112 03/31/18 0713 03/31/18 1204  TROPONINI 0.03* 0.03* 0.04* 0.04*   No results for input(s): TROPIPOC in the last 168 hours.   BNP Recent Labs  Lab 03/30/18 2051  BNP 1,780.8*    Radiology    Dg Chest 2 View  Result Date: 03/30/2018 CLINICAL DATA:  77 y/o  F; pneumothorax on Monday. EXAM: CHEST - 2 VIEW COMPARISON:  03/28/2018 chest radiograph. FINDINGS: Stable cardiomegaly. Single lead pacemaker. Increased right mid and lower lung zone opacities. Central obstruction may be present. Small right effusion. No pneumothorax. Clear left lung. Post median sternotomy. Bones are unremarkable. IMPRESSION: Increased right mid and lower lung zone opacities with stable small right effusion. Findings may represent right middle and lower lobe atelectasis or  pneumonia. Central obstruction is possible. Follow-up per prior chest radiograph. Electronically Signed   By: Kristine Garbe M.D.   On: 03/30/2018 17:50   Ct Head Wo Contrast  Result Date: 03/30/2018 CLINICAL DATA:  Headache. EXAM: CT HEAD WITHOUT CONTRAST TECHNIQUE: Contiguous axial images were obtained from the base of the skull through the vertex without intravenous contrast. COMPARISON:  CT scan of March 15, 2013. FINDINGS: Brain: Mild chronic ischemic white matter disease is noted. Mild diffuse cortical atrophy is noted. New subcortical white matter low density is noted in right cerebellar hemisphere consistent with infarction of indeterminate age. No evidence of mass lesion or hemorrhage is noted. Ventricular size is within normal limits. No mass effect or midline shift is noted. Vascular: Mild chronic ischemic white matter disease is noted. Skull: Normal. Negative for fracture or focal lesion. Sinuses/Orbits: Mild left maxillary sinusitis is noted. Other: None. IMPRESSION: Mild chronic ischemic white matter disease. Mild diffuse cortical atrophy. Subcortical white matter low density is noted in right cerebellar hemisphere consistent with infarction of indeterminate age; MRI is recommended for further evaluation. Electronically Signed   By: Marijo Conception, M.D.   On: 03/30/2018 19:50   Ct Chest W Contrast  Result Date: 03/30/2018 CLINICAL DATA:  Acute respiratory illness EXAM: CT CHEST WITH CONTRAST TECHNIQUE: Multidetector CT imaging of the chest was performed during intravenous contrast administration. CONTRAST:  73mL OMNIPAQUE IOHEXOL 300 MG/ML  SOLN COMPARISON:  Chest x-ray from earlier today FINDINGS: Cardiovascular: Severe cardiomegaly, especially the atria. Status post mitral valve replacement. Single chamber pacer into the right ventricle. No pericardial effusion. Limited opacification of the aorta. There is prominent hepatic vein and hepatic cava reflux and distension that continues  into the renal veins. Probable stenosis at the distal left brachiocephalic vein due to pacer lead, which accounts for multiple venous collaterals in the upper chest. Apparent eccentric filling defect at the branching of the left main pulmonary artery is extraluminal based on reformats. No indication of acute pulmonary embolism. Mediastinum/Nodes: Negative for adenopathy. Lungs/Pleura: Right-sided lung opacity reflects middle and lower lobe atelectasis or scarring with small right effusion. Mild septal thickening and patchy ground-glass opacity, likely mild edema. Upper Abdomen: Venous reflux as noted above.  No acute finding. Musculoskeletal: Degenerative changes in the thoracic spine. No acute finding. IMPRESSION: 1. Radiographic findings reflect scarring/atelectasis in the right middle and lower lobes. 2. Marked cardiomegaly. Elevated right heart pressure with  prominent distention and reflux of infra diaphragmatic veins. 3. Mild septal thickening and patchy ground-glass opacity, favor mild edema over infection. Small right pleural effusion. Electronically Signed   By: Monte Fantasia M.D.   On: 03/30/2018 19:50    Telemetry    04/01/18 Atrial fibrillation HR 66 intermittent pacing- Personally Reviewed  ECG    No new tracings as of 04/01/18 - Personally Reviewed  Cardiac Studies   Echo 03/31/18: Study Conclusions  - Left ventricle: Septal flattening consistent with RV volume /   pressure overload. The cavity size was normal. Wall thickness was   normal. Systolic function was normal. The estimated ejection   fraction was in the range of 60% to 65%. - Aortic valve: There was mild regurgitation. - Mitral valve: MV prosthesis is well seated Motion is difficult to   see Peak and mean gradients through the valve are 19 and 6 mm Hg   respectively. Valve area by continuity equation (using LVOT   flow): 1.21 cm^2. - Left atrium: The atrium was severely dilated. - Right ventricle: The cavity size  was moderately dilated. Systolic   function was mildly reduced. - Right atrium: The atrium was severely dilated. - Tricuspid valve: There was severe regurgitation. - Pulmonary arteries: PA peak pressure: 66 mm Hg (S).  Echo 10/01/16: Study Conclusions - Left ventricle: Systolic function was normal. The estimated ejection fraction was in the range of 50% to 55%. Wall motion was normal; there were no regional wall motion abnormalities. Mitral valve prosthesis and atrial fibrillation prevents evaluation of LV diastolic function. - Ventricular septum: Septal motion showed paradox. The contour showed diastolic flattening. These changes are consistent with RV volume overload. - Aortic valve: Right coronary cusp mobility was moderately restricted. There was trivial regurgitation. - Mitral valve: A mechanical prosthesis was present and functioning normally. - Left atrium: The atrium was severely dilated. - Right ventricle: The cavity size was moderately dilated. Systolic function was mildly to moderately reduced. - Right atrium: The atrium was severely dilated. - Atrial septum: No defect or patent foramen ovale was identified. - Tricuspid valve: There was severe regurgitation. - Pulmonary arteries: Systolic pressure was moderately increased. PA peak pressure: 55 mm Hg (S).  Patient Profile     77 y.o. 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 Karleen Hampshire.  Assessment & Plan    1. Acute on chronic  diastolic heart failure in the setting of URI and missing 2 days of Lasix: -Echocardiogram with improved LVEF, now to 60 to 65% up from 50-55% in 09/2016 -BNP on admission 1781, CXR with right middle lobe collapse and potential underlying consolidation, small right pleural effusion -IV Lasix 60 mg daily, with K supplementation, K+ 3.8 today -Weight, 118lb today, down from 128lb on admission -I&O, net -3 L today, 24-hour total output 2.5 L -Creatinine, 1.08 today -Pt currently below stated baseline weight of 128lb>>>last OP office weight 125lb in 09/2017  2.  Elevated troponin: -Stable, flat>>0.04>0.04 -Likely in the setting of heart failure with demand ischemia, no ACS symptoms -EKG with  atrial fibrillation and no acute ischemic ST-T wave changes -Denies chest pain   3.  Mechanical mitral valve: -Chronic anticoagulation with Coumadin, INR today 2.13 -Coumadin dosing per pharmacy  4.  Atrial fibrillation: -Rate controlled, continue Coumadin with goal INR 2.0-2.5 -INR today, 2.13 -Continue current dose per pharmacy -Diltiazem 120, metoprolol 25 -CHA2DS2VASc =8 (age, female, CHF, HTN, stroke, CAD)  Signed, Kathyrn Drown NP-C [facility] Pager: [phone] 04/01/2018, 9:18 AM    The patient was seen, examined and discussed with Kathyrn Drown, NP  and I agree with the above.   She is now euvolemic, Crea is stable, I would d/c iv Lasix, and start PO lasix 20 mg daily. Minimal troponin elevation with flat trend, sec to demand ischemia, no need for ischemic workup. Her atrial fibrillation is rate controlled, INR is 2.1. Echocardiogram shows normal LVEF 60 to 65% with mild aortic regurgitation, elevated gradients across the mitral valve with mean of 6 mmHg, previously 5 mmHg, she is moderately dilated right ventricle with mildly reduced systolic function and severe tricuspid regurgitation and severe pulmonary hypertension with PASP of 66 mmHg.  Findings are unchanged from prior.  She can be  discharged from cardiac standpoint.  [facility] will sign off.   Medication Recommendations:  As above. Other recommendations (labs, testing, etc):  No further testing. Follow up as an outpatient:  We will arrange.  Ena Dawley, MD 04/01/2018

## 2018-04-01 NOTE — Care Management Note (Addendum)
Case Management Note  Patient Details  Name: BIRDIE FETTY MRN: [id number] Date of Birth: 1940-11-19  Subjective/Objective:       Admitted with CHF,Hx of hypertension, hyperlipidemia, stroke, GERD, mitral valve mechanical valve replacement/ Coumadin, CAD, CHF, sick sinus syndrome, pacemaker placement, CKD-3. Resides with husband.       Iver Nestle (Spouse) Haywood Lasso ([phone]     PCP: [address]   Action/Plan: Transition to home with home health services to follow.  Expected Discharge Date:  04/01/18               Expected Discharge Plan:  Home/Self Care  In-House Referral:     Discharge planning Services     Post Acute Care Choice:    Choice offered to:  Patient  DME Arranged:   n/a DME Agency:   n/a  HH Arranged:  PT, RN, Nurse's Aide [place] Agency:  [place]  Status of Service:  Completed, signed off  If discussed at [place] of Stay Meetings, dates discussed:    Additional Comments:  Sharin Mons, RN 04/01/2018, 3:04 PM

## 2018-04-01 NOTE — Progress Notes (Signed)
Paged [facility]. BP 102/58. 80 of lasix due at 0700

## 2018-04-01 NOTE — Plan of Care (Signed)

## 2018-04-04 LAB — CULTURE, BLOOD (ROUTINE X 2)
Culture: NO GROWTH
Special Requests: ADEQUATE

## 2018-04-06 ENCOUNTER — Telehealth: Payer: Self-pay | Admitting: Cardiovascular Disease

## 2018-04-06 DIAGNOSIS — I252 Old myocardial infarction: Secondary | ICD-10-CM | POA: Diagnosis not present

## 2018-04-06 DIAGNOSIS — Z952 Presence of prosthetic heart valve: Secondary | ICD-10-CM | POA: Diagnosis not present

## 2018-04-06 DIAGNOSIS — M19011 Primary osteoarthritis, right shoulder: Secondary | ICD-10-CM | POA: Diagnosis not present

## 2018-04-06 DIAGNOSIS — Z950 Presence of cardiac pacemaker: Secondary | ICD-10-CM | POA: Diagnosis not present

## 2018-04-06 DIAGNOSIS — I082 Rheumatic disorders of both aortic and tricuspid valves: Secondary | ICD-10-CM | POA: Diagnosis not present

## 2018-04-06 DIAGNOSIS — I130 Hypertensive heart and chronic kidney disease with heart failure and stage 1 through stage 4 chronic kidney disease, or unspecified chronic kidney disease: Secondary | ICD-10-CM | POA: Diagnosis not present

## 2018-04-06 DIAGNOSIS — I2510 Atherosclerotic heart disease of native coronary artery without angina pectoris: Secondary | ICD-10-CM | POA: Diagnosis not present

## 2018-04-06 DIAGNOSIS — N183 Chronic kidney disease, stage 3 (moderate): Secondary | ICD-10-CM | POA: Diagnosis not present

## 2018-04-06 DIAGNOSIS — I4892 Unspecified atrial flutter: Secondary | ICD-10-CM | POA: Diagnosis not present

## 2018-04-06 DIAGNOSIS — I272 Pulmonary hypertension, unspecified: Secondary | ICD-10-CM | POA: Diagnosis not present

## 2018-04-06 DIAGNOSIS — Z7901 Long term (current) use of anticoagulants: Secondary | ICD-10-CM | POA: Diagnosis not present

## 2018-04-06 DIAGNOSIS — Z8679 Personal history of other diseases of the circulatory system: Secondary | ICD-10-CM | POA: Diagnosis not present

## 2018-04-06 DIAGNOSIS — I481 Persistent atrial fibrillation: Secondary | ICD-10-CM | POA: Diagnosis not present

## 2018-04-06 DIAGNOSIS — I5042 Chronic combined systolic (congestive) and diastolic (congestive) heart failure: Secondary | ICD-10-CM | POA: Diagnosis not present

## 2018-04-06 DIAGNOSIS — R042 Hemoptysis: Secondary | ICD-10-CM | POA: Diagnosis not present

## 2018-04-06 DIAGNOSIS — E785 Hyperlipidemia, unspecified: Secondary | ICD-10-CM | POA: Diagnosis not present

## 2018-04-06 DIAGNOSIS — I495 Sick sinus syndrome: Secondary | ICD-10-CM | POA: Diagnosis not present

## 2018-04-06 DIAGNOSIS — K219 Gastro-esophageal reflux disease without esophagitis: Secondary | ICD-10-CM | POA: Diagnosis not present

## 2018-04-06 DIAGNOSIS — I4510 Unspecified right bundle-branch block: Secondary | ICD-10-CM | POA: Diagnosis not present

## 2018-04-06 NOTE — Telephone Encounter (Signed)
Spoke with Donita from [facility] who states she is needing verbal orders for home health nursing. Routing to Dr. Loletha Grayer.

## 2018-04-06 NOTE — Telephone Encounter (Signed)
Spoke with Donita and informed that per Dr. Loletha Grayer they have verbal permission for home health. Verbalized understanding.

## 2018-04-06 NOTE — Telephone Encounter (Signed)
Yes please

## 2018-04-06 NOTE — Discharge Summary (Signed)
Physician Discharge Summary  Carrie [name]:[id number] DOB: [birth year] DOA: 03/30/2018  PCP: Lucianne Lei, MD  Admit date: 03/30/2018 Discharge date: 04/01/2018  Admitted From: Home.  Disposition:  Home.   Recommendations for Outpatient Follow-up:  1. Follow up with PCP in 1-2 weeks 2. Please obtain BMP/CBC in one week 3. Please follow up with cardiology as recommended.   Discharge Condition:stable.  CODE STATUS:full code.  Diet recommendation: Heart Healthy  Brief/Interim Summary: Carrie [name] a 77 y.o.femalewith medical history significant ofhypertension, hyperlipidemia, stroke, GERD, mitral valve mechanical valve replacement on Coumadin (target INR 2.0-2.5 per cardiologist due to history of subdural hematoma and liver hemorrhage), CAD, CHF, sick sinus syndrome, pacemaker placement, CKD-3, who presents with cough, shortness breath, hemoptysis, bilateral leg edema and blurry vision.   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 Changed to oral on discharge Repeat echocardiogram and reviewed.  Mildly elevated troponins , flat trend, probably from CHF.  Strict intake and output, daily weights.  Resume home dose BB,  Cardiology consulted for recommendations.  Recommend outpatient  follow up.    Stage 3 CKD Creatinine at baseline.    Mild hemoptysis: resolved.    Persistent atrial fibrillation:  Rate controlled, on BB, on coumadin for anticoagulation.    Mechanical mitral valve:  On coumadin for anti coagulation. Goal of INR is between 2 aand 2.5 , due to history of sub dural hematoma and liver hemorrhage.    Sick sinus syndrome s/p pacemaker   Hypertension: well controlled.    Blurry vision:  Resolved.  And no other complaints.  Initial CT head showed Mild chronic ischemic white matter disease. Mild diffuse cortical atrophy. Subcortical white matter low density is noted in right cerebellar hemisphere consistent with infarction of indeterminate age;Marland Kitchen Unable to do an MRI because of Pacemaker. Discussed with Dr Leonel Ramsay with Neurology , recommended getting a repeat CT head without contrast , which did not show any changes in the right cerebellar hemisphere.  Meanwhile carotid duplex done does not show significant stenosis.  Echocardiogram ordered and reviewed with the patient,  Therapy evaluation with PT and OT recommending home health.  Pt denies any issues with swallowing.    Discharge Instructions  Discharge Instructions    Diet - low sodium heart healthy   Complete by:  As directed    Discharge instructions   Complete by:  As directed    PLEASE follow up with cardiology as recommended.     Allergies as of 04/01/2018      Reactions   Codeine Nausea And Vomiting      Medication List    TAKE these medications   amoxicillin-clavulanate 875-125 MG tablet Commonly known as:  AUGMENTIN Take 1 tablet by mouth every 12 (twelve) hours.   atorvastatin 40 MG  tablet Commonly known as:  LIPITOR Take 1 tablet (40 mg total) by mouth daily at 6 PM.   Biotin 1000 MCG tablet Take 1,000 mcg by mouth daily.   CARTIA XT 120 MG 24 hr capsule Generic drug:  diltiazem TAKE ONE CAPSULE BY MOUTH DAILY   dextromethorphan-guaiFENesin 30-600  MG 12hr tablet Commonly known as:  MUCINEX DM Take 1 tablet by mouth 2 (two) times daily as needed for cough.   docusate sodium 100 MG capsule Commonly known as:  COLACE Take 200 mg by mouth 2 (two) times daily.   Fish Oil 1000 MG Caps Take 1,000 mg by mouth daily.   furosemide 20 MG tablet Commonly known as:  LASIX Take 1 tablet (20 mg total) by mouth daily. What changed:    medication strength  how much to take   metoprolol tartrate 25 MG tablet Commonly known as:  LOPRESSOR Take 1 tablet (25 mg total) by mouth 2 (two) times daily.   MULTIVITAMINS PO Take 1 tablet by mouth daily.   nitroGLYCERIN 0.4 MG SL tablet Commonly known as:  NITROSTAT Place 1 tablet (0.4 mg total) under the tongue every 5 (five) minutes as needed for chest pain.   polyethylene glycol packet Commonly known as:  MIRALAX / GLYCOLAX Take 17 g by mouth as needed (for constipation). Reported on 03/25/2016   potassium chloride SA 20 MEQ tablet Commonly known as:  K-DUR,KLOR-CON TAKE 1 TABLET BY MOUTH DAILY   warfarin 5 MG tablet Commonly known as:  COUMADIN Take as directed. If you are unsure how to take this medication, talk to your nurse or doctor. Original instructions:  TAKE 1 TO 1 AND 1/2 TABLETS BY MOUTH DAILY AS DIRECTED What changed:  See the new instructions.      Follow-up Information    Almyra Deforest, [place] Follow up on 04/22/2018.   Specialties:  Cardiology, Radiology Why:  Your follow up appointment will be on 04/22/18 at [facility]. Please arrive to your appointment at 0845am.  Contact information: [address] [place] [address] [phone]        Lucianne Lei, MD. Schedule an appointment as soon as possible for a visit in 1 week(s).   Specialty:  Family Medicine Contact information: [address] [place] [address] [phone]        [name], MD .   Specialty:  Cardiology Contact information: [address] [place]  [address] [phone]          Allergies  Allergen Reactions  . Codeine Nausea And Vomiting    Consultations:  cardiology   Procedures/Studies: Dg Chest 2 View  Result Date: 03/30/2018 CLINICAL DATA:  77 y/o  F; pneumothorax on Monday. EXAM: CHEST - 2 VIEW COMPARISON:  03/28/2018 chest radiograph. FINDINGS: Stable cardiomegaly. Single lead pacemaker. Increased right mid and lower lung zone opacities. Central obstruction may be present. Small right effusion. No pneumothorax. Clear left lung. Post median sternotomy. Bones are unremarkable. IMPRESSION: Increased right mid and lower lung zone opacities with stable small right effusion. Findings may represent right middle and lower lobe atelectasis or pneumonia. Central obstruction is possible. Follow-up per prior chest radiograph. Electronically Signed   By: Kristine Garbe M.D.   On: 03/30/2018 17:50   Dg Chest 2 View  Result Date: 03/28/2018 CLINICAL DATA:  Congestion and shortness of breath for several days. Assess for infiltrate. EXAM: CHEST - 2 VIEW COMPARISON:  Chest radiograph September 29, 2016 FINDINGS: RIGHT middle lobe  collapse and possible underlying consolidation. Small RIGHT pleural effusion. LEFT lung is clear. Stable cardiomegaly. Calcified aortic arch. Status post median sternotomy for cardiac valve replacement. Single lead LEFT cardiac pacemaker. No pneumothorax. Soft tissue planes and included osseous structures are non suspicious. IMPRESSION: RIGHT middle lobe collapse and potential underlying consolidation. Small RIGHT pleural effusion. Followup PA and lateral chest X-ray is recommended in 3-4 weeks following trial of antibiotic therapy to ensure resolution and exclude underlying malignancy. Stable cardiomegaly. Aortic Atherosclerosis (ICD10-I70.0). These results will be called to the ordering clinician or representative by the Radiologist Assistant, and communication documented in the PACS or zVision Dashboard.  Electronically Signed   By: Elon Alas M.D.   On: 03/28/2018 16:37   Ct Head Wo Contrast  Result Date: 04/01/2018 CLINICAL DATA:  Follow-up ischemic changes EXAM: CT HEAD WITHOUT CONTRAST TECHNIQUE: Contiguous axial images were obtained from the base of the skull through the vertex without intravenous contrast. COMPARISON:  03/30/2018 FINDINGS: Brain: No evidence of acute infarction, hemorrhage, hydrocephalus, extra-axial collection or mass lesion/mass effect. No findings to suggest age-indeterminate infarct in the right cerebellum on the current CT. Mild age related atrophy. Subcortical white matter and periventricular small vessel ischemic changes. Vascular: Intracranial atherosclerosis. Skull: Normal. Negative for fracture or focal lesion. Sinuses/Orbits: The visualized paranasal sinuses are essentially clear. The mastoid air cells are unopacified. Other: None. IMPRESSION: No evidence of acute intracranial abnormality. No findings to suggest age indeterminate infarct in the right cerebellum on the current CT. Age-related atrophy with small vessel ischemic changes. Electronically Signed   By: Julian Hy M.D.   On: 04/01/2018 09:20   Ct Head Wo Contrast  Result Date: 03/30/2018 CLINICAL DATA:  Headache. EXAM: CT HEAD WITHOUT CONTRAST TECHNIQUE: Contiguous axial images were obtained from the base of the skull through the vertex without intravenous contrast. COMPARISON:  CT scan of March 15, 2013. FINDINGS: Brain: Mild chronic ischemic white matter disease is noted. Mild diffuse cortical atrophy is noted. New subcortical white matter low density is noted in right cerebellar hemisphere consistent with infarction of indeterminate age. No evidence of mass lesion or hemorrhage is noted. Ventricular size is within normal limits. No mass effect or midline shift is noted. Vascular: Mild chronic ischemic white matter disease is noted. Skull: Normal. Negative for fracture or focal lesion. Sinuses/Orbits:  Mild left maxillary sinusitis is noted. Other: None. IMPRESSION: Mild chronic ischemic white matter disease. Mild diffuse cortical atrophy. Subcortical white matter low density is noted in right cerebellar hemisphere consistent with infarction of indeterminate age; MRI is recommended for further evaluation. Electronically Signed   By: Marijo Conception, M.D.   On: 03/30/2018 19:50   Ct Chest W Contrast  Result Date: 03/30/2018 CLINICAL DATA:  Acute respiratory illness EXAM: CT CHEST WITH CONTRAST TECHNIQUE: Multidetector CT imaging of the chest was performed during intravenous contrast administration. CONTRAST:  4mL OMNIPAQUE IOHEXOL 300 MG/ML  SOLN COMPARISON:  Chest x-ray from earlier today FINDINGS: Cardiovascular: Severe cardiomegaly, especially the atria. Status post mitral valve replacement. Single chamber pacer into the right ventricle. No pericardial effusion. Limited opacification of the aorta. There is prominent hepatic vein and hepatic cava reflux and distension that continues into the renal veins. Probable stenosis at the distal left brachiocephalic vein due to pacer lead, which accounts for multiple venous collaterals in the upper chest. Apparent eccentric filling defect at the branching of the left main pulmonary artery is extraluminal based on reformats. No indication of acute pulmonary embolism. Mediastinum/Nodes: Negative for adenopathy. Lungs/Pleura:  Right-sided lung opacity reflects middle and lower lobe atelectasis or scarring with small right effusion. Mild septal thickening and patchy ground-glass opacity, likely mild edema. Upper Abdomen: Venous reflux as noted above.  No acute finding. Musculoskeletal: Degenerative changes in the thoracic spine. No acute finding. IMPRESSION: 1. Radiographic findings reflect scarring/atelectasis in the right middle and lower lobes. 2. Marked cardiomegaly. Elevated right heart pressure with prominent distention and reflux of infra diaphragmatic veins. 3.  Mild septal thickening and patchy ground-glass opacity, favor mild edema over infection. Small right pleural effusion. Electronically Signed   By: Monte Fantasia M.D.   On: 03/30/2018 19:50    Echocardiogram.    Subjective: NO CHEST PAIN , or sob. No nausea, vomiting or headache.   Discharge Exam: Vitals:   04/01/18 1000 04/01/18 1159  BP: 105/67 (!) 97/55  Pulse: 80 75  Resp: (!) 24 (!) 24  Temp:  98.4 F (36.9 C)  SpO2: 96% 100%   Vitals:   04/01/18 0809 04/01/18 0957 04/01/18 1000 04/01/18 1159  BP: (!) 93/57 105/67 105/67 (!) 97/55  Pulse: 78 82 80 75  Resp: 14  (!) 24 (!) 24  Temp: 98.3 F (36.8 C)   98.4 F (36.9 C)  TempSrc: Oral   Oral  SpO2: 96%  96% 100%  Weight:      Height:        General: Pt is alert, awake, not in acute distress Cardiovascular: RRR, S1/S2 +, no rubs, no gallops Respiratory: CTA bilaterally, no wheezing, no rhonchi Abdominal: Soft, NT, ND, bowel sounds + Extremities: no edema, no cyanosis    The results of significant diagnostics from this hospitalization (including imaging, microbiology, ancillary and laboratory) are listed below for reference.     Microbiology: Recent Results (from the past 240 hour(s))  Blood culture (routine x 2)     Status: None   Collection Time: 03/30/18  6:59 PM  Result Value Ref Range Status   Specimen Description BLOOD LEFT FOREARM  Final   Special Requests   Final    BOTTLES DRAWN AEROBIC AND ANAEROBIC Blood Culture adequate volume   Culture   Final    NO GROWTH 5 DAYS Performed at [facility], [address] [place], [place] [address]    Report Status 04/04/2018 FINAL  Final  MRSA PCR Screening     Status: None   Collection Time: 03/31/18 12:54 AM  Result Value Ref Range Status   MRSA by PCR NEGATIVE NEGATIVE Final    Comment:        The GeneXpert MRSA Assay (FDA approved for NASAL specimens only), is one component of a comprehensive MRSA colonization surveillance program. It is  not intended to diagnose MRSA infection nor to guide or monitor treatment for MRSA infections. Performed at [facility], [place] [address] [place], [place] [address]      Labs: BNP (last 3 results) Recent Labs    05/11/17 1223 03/30/18 2051  BNP 1,295.5* 6,812.7*   Basic Metabolic Panel: Recent Labs  Lab 03/30/18 1639 03/31/18 0713 04/01/18 0338  NA 137 138 137  K 4.6 3.7 3.8  CL 106 101 104  CO2 21* 25 25  GLUCOSE 93 88 117*  BUN 19 18 20   CREATININE 1.05* 1.08* 1.08*  CALCIUM 9.5 9.5 9.3   Liver Function Tests: Recent Labs  Lab 03/30/18 2051  AST 137*  ALT 28  ALKPHOS 117  BILITOT 1.7*  PROT 7.2  ALBUMIN 3.0*   No results  for input(s): LIPASE, AMYLASE in the last 168 hours. No results for input(s): AMMONIA in the last 168 hours. CBC: Recent Labs  Lab 03/30/18 1639  WBC 4.7  HGB 9.0*  HCT 28.1*  MCV 91.5  PLT 212   Cardiac Enzymes: Recent Labs  Lab 03/30/18 1639 03/30/18 2051 03/31/18 0112 03/31/18 0713 03/31/18 1204  TROPONINI 0.03* 0.03* 0.03* 0.04* 0.04*   BNP: Invalid input(s): POCBNP CBG: No results for input(s): GLUCAP in the last 168 hours. D-Dimer No results for input(s): DDIMER in the last 72 hours. Hgb A1c No results for input(s): HGBA1C in the last 72 hours. Lipid Profile No results for input(s): CHOL, HDL, LDLCALC, TRIG, CHOLHDL, LDLDIRECT in the last 72 hours. Thyroid function studies No results for input(s): TSH, T4TOTAL, T3FREE, THYROIDAB in the last 72 hours.  Invalid input(s): FREET3 Anemia work up No results for input(s): VITAMINB12, FOLATE, FERRITIN, TIBC, IRON, RETICCTPCT in the last 72 hours. Urinalysis    Component Value Date/Time   COLORURINE YELLOW 03/18/2016 1332   APPEARANCEUR CLEAR 03/18/2016 1332   LABSPEC 1.013 03/18/2016 1332   PHURINE 7.0 03/18/2016 1332   GLUCOSEU NEGATIVE 03/18/2016 1332   HGBUR MODERATE (A) 03/18/2016 1332   BILIRUBINUR NEGATIVE 03/18/2016 1332   KETONESUR NEGATIVE  03/18/2016 1332   PROTEINUR NEGATIVE 03/18/2016 1332   UROBILINOGEN 0.2 03/15/2013 1323   NITRITE NEGATIVE 03/18/2016 1332   LEUKOCYTESUR NEGATIVE 03/18/2016 1332   Sepsis Labs Invalid input(s): PROCALCITONIN,  WBC,  LACTICIDVEN Microbiology Recent Results (from the past 240 hour(s))  Blood culture (routine x 2)     Status: None   Collection Time: 03/30/18  6:59 PM  Result Value Ref Range Status   Specimen Description BLOOD LEFT FOREARM  Final   Special Requests   Final    BOTTLES DRAWN AEROBIC AND ANAEROBIC Blood Culture adequate volume   Culture   Final    NO GROWTH 5 DAYS Performed at [facility], [place] [address] [place], [place] [address]    Report Status 04/04/2018 FINAL  Final  MRSA PCR Screening     Status: None   Collection Time: 03/31/18 12:54 AM  Result Value Ref Range Status   MRSA by PCR NEGATIVE NEGATIVE Final    Comment:        The GeneXpert MRSA Assay (FDA approved for NASAL specimens only), is one component of a comprehensive MRSA colonization surveillance program. It is not intended to diagnose MRSA infection nor to guide or monitor treatment for MRSA infections. Performed at [facility], [place] [address] [place], [place] [address]      Time coordinating discharge:34  minutes  SIGNED:   Hosie Poisson, MD  [facility] 04/06/2018, 8:26 AM Pager   If 7PM-7AM, please contact night-coverage [email or web address] Password [email or web address]

## 2018-04-06 NOTE — Telephone Encounter (Signed)
New Message:      Carrie [name] is calling from [facility] and would like to know if Dr. Loletha Grayer will be signing off on the orders for them to come and do visits with the pt.

## 2018-04-09 DIAGNOSIS — Z952 Presence of prosthetic heart valve: Secondary | ICD-10-CM | POA: Diagnosis not present

## 2018-04-09 DIAGNOSIS — I4510 Unspecified right bundle-branch block: Secondary | ICD-10-CM | POA: Diagnosis not present

## 2018-04-09 DIAGNOSIS — M19011 Primary osteoarthritis, right shoulder: Secondary | ICD-10-CM | POA: Diagnosis not present

## 2018-04-09 DIAGNOSIS — I082 Rheumatic disorders of both aortic and tricuspid valves: Secondary | ICD-10-CM | POA: Diagnosis not present

## 2018-04-09 DIAGNOSIS — N183 Chronic kidney disease, stage 3 (moderate): Secondary | ICD-10-CM | POA: Diagnosis not present

## 2018-04-09 DIAGNOSIS — Z8679 Personal history of other diseases of the circulatory system: Secondary | ICD-10-CM | POA: Diagnosis not present

## 2018-04-09 DIAGNOSIS — R042 Hemoptysis: Secondary | ICD-10-CM | POA: Diagnosis not present

## 2018-04-09 DIAGNOSIS — Z7901 Long term (current) use of anticoagulants: Secondary | ICD-10-CM | POA: Diagnosis not present

## 2018-04-09 DIAGNOSIS — I252 Old myocardial infarction: Secondary | ICD-10-CM | POA: Diagnosis not present

## 2018-04-09 DIAGNOSIS — I272 Pulmonary hypertension, unspecified: Secondary | ICD-10-CM | POA: Diagnosis not present

## 2018-04-09 DIAGNOSIS — I481 Persistent atrial fibrillation: Secondary | ICD-10-CM | POA: Diagnosis not present

## 2018-04-09 DIAGNOSIS — I4892 Unspecified atrial flutter: Secondary | ICD-10-CM | POA: Diagnosis not present

## 2018-04-09 DIAGNOSIS — I495 Sick sinus syndrome: Secondary | ICD-10-CM | POA: Diagnosis not present

## 2018-04-09 DIAGNOSIS — I5042 Chronic combined systolic (congestive) and diastolic (congestive) heart failure: Secondary | ICD-10-CM | POA: Diagnosis not present

## 2018-04-09 DIAGNOSIS — I2510 Atherosclerotic heart disease of native coronary artery without angina pectoris: Secondary | ICD-10-CM | POA: Diagnosis not present

## 2018-04-09 DIAGNOSIS — E785 Hyperlipidemia, unspecified: Secondary | ICD-10-CM | POA: Diagnosis not present

## 2018-04-09 DIAGNOSIS — I130 Hypertensive heart and chronic kidney disease with heart failure and stage 1 through stage 4 chronic kidney disease, or unspecified chronic kidney disease: Secondary | ICD-10-CM | POA: Diagnosis not present

## 2018-04-09 DIAGNOSIS — Z950 Presence of cardiac pacemaker: Secondary | ICD-10-CM | POA: Diagnosis not present

## 2018-04-09 DIAGNOSIS — K219 Gastro-esophageal reflux disease without esophagitis: Secondary | ICD-10-CM | POA: Diagnosis not present

## 2018-04-11 DIAGNOSIS — J398 Other specified diseases of upper respiratory tract: Secondary | ICD-10-CM | POA: Diagnosis not present

## 2018-04-11 DIAGNOSIS — I482 Chronic atrial fibrillation: Secondary | ICD-10-CM | POA: Diagnosis not present

## 2018-04-11 DIAGNOSIS — R040 Epistaxis: Secondary | ICD-10-CM | POA: Diagnosis not present

## 2018-04-11 DIAGNOSIS — I110 Hypertensive heart disease with heart failure: Secondary | ICD-10-CM | POA: Diagnosis not present

## 2018-04-12 ENCOUNTER — Telehealth: Payer: Self-pay | Admitting: Cardiovascular Disease

## 2018-04-12 DIAGNOSIS — I130 Hypertensive heart and chronic kidney disease with heart failure and stage 1 through stage 4 chronic kidney disease, or unspecified chronic kidney disease: Secondary | ICD-10-CM | POA: Diagnosis not present

## 2018-04-12 DIAGNOSIS — E785 Hyperlipidemia, unspecified: Secondary | ICD-10-CM | POA: Diagnosis not present

## 2018-04-12 DIAGNOSIS — K219 Gastro-esophageal reflux disease without esophagitis: Secondary | ICD-10-CM | POA: Diagnosis not present

## 2018-04-12 DIAGNOSIS — Z950 Presence of cardiac pacemaker: Secondary | ICD-10-CM | POA: Diagnosis not present

## 2018-04-12 DIAGNOSIS — Z7901 Long term (current) use of anticoagulants: Secondary | ICD-10-CM | POA: Diagnosis not present

## 2018-04-12 DIAGNOSIS — Z8679 Personal history of other diseases of the circulatory system: Secondary | ICD-10-CM | POA: Diagnosis not present

## 2018-04-12 DIAGNOSIS — I2510 Atherosclerotic heart disease of native coronary artery without angina pectoris: Secondary | ICD-10-CM | POA: Diagnosis not present

## 2018-04-12 DIAGNOSIS — I5042 Chronic combined systolic (congestive) and diastolic (congestive) heart failure: Secondary | ICD-10-CM | POA: Diagnosis not present

## 2018-04-12 DIAGNOSIS — I481 Persistent atrial fibrillation: Secondary | ICD-10-CM | POA: Diagnosis not present

## 2018-04-12 DIAGNOSIS — I495 Sick sinus syndrome: Secondary | ICD-10-CM | POA: Diagnosis not present

## 2018-04-12 DIAGNOSIS — Z952 Presence of prosthetic heart valve: Secondary | ICD-10-CM | POA: Diagnosis not present

## 2018-04-12 DIAGNOSIS — N183 Chronic kidney disease, stage 3 (moderate): Secondary | ICD-10-CM | POA: Diagnosis not present

## 2018-04-12 DIAGNOSIS — R042 Hemoptysis: Secondary | ICD-10-CM | POA: Diagnosis not present

## 2018-04-12 DIAGNOSIS — M19011 Primary osteoarthritis, right shoulder: Secondary | ICD-10-CM | POA: Diagnosis not present

## 2018-04-12 DIAGNOSIS — I082 Rheumatic disorders of both aortic and tricuspid valves: Secondary | ICD-10-CM | POA: Diagnosis not present

## 2018-04-12 DIAGNOSIS — I272 Pulmonary hypertension, unspecified: Secondary | ICD-10-CM | POA: Diagnosis not present

## 2018-04-12 DIAGNOSIS — I4510 Unspecified right bundle-branch block: Secondary | ICD-10-CM | POA: Diagnosis not present

## 2018-04-12 DIAGNOSIS — I4892 Unspecified atrial flutter: Secondary | ICD-10-CM | POA: Diagnosis not present

## 2018-04-12 DIAGNOSIS — I252 Old myocardial infarction: Secondary | ICD-10-CM | POA: Diagnosis not present

## 2018-04-12 NOTE — Telephone Encounter (Signed)
Yes, please MCr

## 2018-04-12 NOTE — Telephone Encounter (Signed)
New Message      [facility]ron Russell [facility]. She is needing verbal order to do 1 week 1 then 2 week 3 routine therapy. For strengthen and endurance.

## 2018-04-12 NOTE — Telephone Encounter (Signed)
Returned call, [facility] aware.    Verbal orders given for PT

## 2018-04-13 ENCOUNTER — Ambulatory Visit (INDEPENDENT_AMBULATORY_CARE_PROVIDER_SITE_OTHER): Payer: Medicare Other | Admitting: Pharmacist

## 2018-04-13 DIAGNOSIS — Z7901 Long term (current) use of anticoagulants: Secondary | ICD-10-CM

## 2018-04-13 DIAGNOSIS — I252 Old myocardial infarction: Secondary | ICD-10-CM | POA: Diagnosis not present

## 2018-04-13 DIAGNOSIS — N183 Chronic kidney disease, stage 3 (moderate): Secondary | ICD-10-CM | POA: Diagnosis not present

## 2018-04-13 DIAGNOSIS — I272 Pulmonary hypertension, unspecified: Secondary | ICD-10-CM | POA: Diagnosis not present

## 2018-04-13 DIAGNOSIS — I4892 Unspecified atrial flutter: Secondary | ICD-10-CM | POA: Diagnosis not present

## 2018-04-13 DIAGNOSIS — I5042 Chronic combined systolic (congestive) and diastolic (congestive) heart failure: Secondary | ICD-10-CM | POA: Diagnosis not present

## 2018-04-13 DIAGNOSIS — R042 Hemoptysis: Secondary | ICD-10-CM | POA: Diagnosis not present

## 2018-04-13 DIAGNOSIS — I481 Persistent atrial fibrillation: Secondary | ICD-10-CM | POA: Diagnosis not present

## 2018-04-13 DIAGNOSIS — Z950 Presence of cardiac pacemaker: Secondary | ICD-10-CM | POA: Diagnosis not present

## 2018-04-13 DIAGNOSIS — Z8679 Personal history of other diseases of the circulatory system: Secondary | ICD-10-CM | POA: Diagnosis not present

## 2018-04-13 DIAGNOSIS — I4510 Unspecified right bundle-branch block: Secondary | ICD-10-CM | POA: Diagnosis not present

## 2018-04-13 DIAGNOSIS — I130 Hypertensive heart and chronic kidney disease with heart failure and stage 1 through stage 4 chronic kidney disease, or unspecified chronic kidney disease: Secondary | ICD-10-CM | POA: Diagnosis not present

## 2018-04-13 DIAGNOSIS — I495 Sick sinus syndrome: Secondary | ICD-10-CM | POA: Diagnosis not present

## 2018-04-13 DIAGNOSIS — I482 Chronic atrial fibrillation, unspecified: Secondary | ICD-10-CM

## 2018-04-13 DIAGNOSIS — Z952 Presence of prosthetic heart valve: Secondary | ICD-10-CM

## 2018-04-13 DIAGNOSIS — K219 Gastro-esophageal reflux disease without esophagitis: Secondary | ICD-10-CM | POA: Diagnosis not present

## 2018-04-13 DIAGNOSIS — I082 Rheumatic disorders of both aortic and tricuspid valves: Secondary | ICD-10-CM | POA: Diagnosis not present

## 2018-04-13 DIAGNOSIS — E785 Hyperlipidemia, unspecified: Secondary | ICD-10-CM | POA: Diagnosis not present

## 2018-04-13 DIAGNOSIS — M19011 Primary osteoarthritis, right shoulder: Secondary | ICD-10-CM | POA: Diagnosis not present

## 2018-04-13 DIAGNOSIS — I2510 Atherosclerotic heart disease of native coronary artery without angina pectoris: Secondary | ICD-10-CM | POA: Diagnosis not present

## 2018-04-13 LAB — CUP PACEART REMOTE DEVICE CHECK
Battery Impedance: 712 Ohm
Battery Remaining Longevity: 80 mo
Battery Voltage: 2.77 V
Brady Statistic RV Percent Paced: 18 %
Implantable Lead Implant Date: 20080326
Implantable Lead Location: 753860
Implantable Lead Model: 4076
Implantable Pulse Generator Implant Date: 20151103
Lead Channel Pacing Threshold Amplitude: 0.875 V
Lead Channel Setting Pacing Amplitude: 2.5 V
Lead Channel Setting Pacing Pulse Width: 0.4 ms
Lead Channel Setting Sensing Sensitivity: 2 mV
MDC IDC MSMT LEADCHNL RA IMPEDANCE VALUE: 0 Ohm
MDC IDC MSMT LEADCHNL RV IMPEDANCE VALUE: 476 Ohm
MDC IDC MSMT LEADCHNL RV PACING THRESHOLD PULSEWIDTH: 0.4 ms
MDC IDC SESS DTM: 20190606113352

## 2018-04-13 LAB — POCT INR: INR: 2.3 (ref 2.0–3.0)

## 2018-04-14 ENCOUNTER — Telehealth: Payer: Self-pay | Admitting: Cardiovascular Disease

## 2018-04-14 NOTE — Telephone Encounter (Signed)
Patient aware of recommendations and verbalized understanding

## 2018-04-14 NOTE — Telephone Encounter (Signed)
Received call from [place] with [facility].  She states patient is in the heart healthy program and weights everyday and the weight is reported to them.  She states she weighed in this AM and this was an increase of 6.4 lbs in 1 day, 8.4 lbs in 5 days.   She states patient reports some SOB, decrease in energy, BL LE edema L>R.   Patient is taking 20 mg furosemide daily.        Routed to MD for review and recommendations.   Jess aware will call patient with recommendations/med changes.

## 2018-04-14 NOTE — Telephone Encounter (Signed)
Please increase furosemide to 40 mg daily for 4 days, call us back with weight, BP and symptoms next Monday MCr

## 2018-04-14 NOTE — Telephone Encounter (Signed)
New Message       Pt c/o swelling: STAT is pt has developed SOB within 24 hours  1) How much weight have you gained and in what time span? 6.4 in one day   2) If swelling, where is the swelling located? Both legs  3) Are you currently taking a fluid pill? yes  4) Are you currently SOB? Yes  5) Do you have a log of your daily weights (if so, list)? Nurse will fax over  6) Have you gained 3 pounds in a day or 5 pounds in a week? Yes  7) Have you traveled recently? No         Patient was discharged from hospital on 07/17

## 2018-04-15 ENCOUNTER — Telehealth: Payer: Self-pay | Admitting: Cardiovascular Disease

## 2018-04-15 DIAGNOSIS — I495 Sick sinus syndrome: Secondary | ICD-10-CM | POA: Diagnosis not present

## 2018-04-15 DIAGNOSIS — Z950 Presence of cardiac pacemaker: Secondary | ICD-10-CM | POA: Diagnosis not present

## 2018-04-15 DIAGNOSIS — Z952 Presence of prosthetic heart valve: Secondary | ICD-10-CM | POA: Diagnosis not present

## 2018-04-15 DIAGNOSIS — I4510 Unspecified right bundle-branch block: Secondary | ICD-10-CM | POA: Diagnosis not present

## 2018-04-15 DIAGNOSIS — I4892 Unspecified atrial flutter: Secondary | ICD-10-CM | POA: Diagnosis not present

## 2018-04-15 DIAGNOSIS — I481 Persistent atrial fibrillation: Secondary | ICD-10-CM | POA: Diagnosis not present

## 2018-04-15 DIAGNOSIS — I082 Rheumatic disorders of both aortic and tricuspid valves: Secondary | ICD-10-CM | POA: Diagnosis not present

## 2018-04-15 DIAGNOSIS — E785 Hyperlipidemia, unspecified: Secondary | ICD-10-CM | POA: Diagnosis not present

## 2018-04-15 DIAGNOSIS — I5042 Chronic combined systolic (congestive) and diastolic (congestive) heart failure: Secondary | ICD-10-CM | POA: Diagnosis not present

## 2018-04-15 DIAGNOSIS — K219 Gastro-esophageal reflux disease without esophagitis: Secondary | ICD-10-CM | POA: Diagnosis not present

## 2018-04-15 DIAGNOSIS — I252 Old myocardial infarction: Secondary | ICD-10-CM | POA: Diagnosis not present

## 2018-04-15 DIAGNOSIS — R042 Hemoptysis: Secondary | ICD-10-CM | POA: Diagnosis not present

## 2018-04-15 DIAGNOSIS — Z7901 Long term (current) use of anticoagulants: Secondary | ICD-10-CM | POA: Diagnosis not present

## 2018-04-15 DIAGNOSIS — M19011 Primary osteoarthritis, right shoulder: Secondary | ICD-10-CM | POA: Diagnosis not present

## 2018-04-15 DIAGNOSIS — I130 Hypertensive heart and chronic kidney disease with heart failure and stage 1 through stage 4 chronic kidney disease, or unspecified chronic kidney disease: Secondary | ICD-10-CM | POA: Diagnosis not present

## 2018-04-15 DIAGNOSIS — Z8679 Personal history of other diseases of the circulatory system: Secondary | ICD-10-CM | POA: Diagnosis not present

## 2018-04-15 DIAGNOSIS — N183 Chronic kidney disease, stage 3 (moderate): Secondary | ICD-10-CM | POA: Diagnosis not present

## 2018-04-15 DIAGNOSIS — I272 Pulmonary hypertension, unspecified: Secondary | ICD-10-CM | POA: Diagnosis not present

## 2018-04-15 DIAGNOSIS — I2510 Atherosclerotic heart disease of native coronary artery without angina pectoris: Secondary | ICD-10-CM | POA: Diagnosis not present

## 2018-04-15 NOTE — Telephone Encounter (Signed)
Returned call to patient's husband and also spoke with OT who is at home. She states patient had 4lb weight gain since yesterday and followed MD recommendation of lasix 40mg  daily. Explained that per note yesterday, patient should take lasix 40mg  daily for 4 days and record daily weights, BP and call on Monday with these readings & symptoms. Patient's O2 is 97%, denies SOB, but OT feels breathing is labored. She is having good UOP on lasix.   Routed to MD as Juluis Rainier

## 2018-04-15 NOTE — Telephone Encounter (Signed)
New Message   Pt c/o swelling: STAT is pt has developed SOB within 24 hours  1) How much weight have you gained and in what time span? 4lbs in 24 hours  2) If swelling, where is the swelling located? Feet and abdomin and hasn't been to the bathroom  3) Are you currently taking a fluid pill? yes  4) Are you currently SOB? Yes OT state 92% and hr 76  5) Do you have a log of your daily weights (if so, list)?   6) Have you gained 3 pounds in a day or 5 pounds in a week? 24 hours  7) Have you traveled recently? no

## 2018-04-18 DIAGNOSIS — I4892 Unspecified atrial flutter: Secondary | ICD-10-CM | POA: Diagnosis not present

## 2018-04-18 DIAGNOSIS — Z950 Presence of cardiac pacemaker: Secondary | ICD-10-CM | POA: Diagnosis not present

## 2018-04-18 DIAGNOSIS — Z8679 Personal history of other diseases of the circulatory system: Secondary | ICD-10-CM | POA: Diagnosis not present

## 2018-04-18 DIAGNOSIS — I2510 Atherosclerotic heart disease of native coronary artery without angina pectoris: Secondary | ICD-10-CM | POA: Diagnosis not present

## 2018-04-18 DIAGNOSIS — I130 Hypertensive heart and chronic kidney disease with heart failure and stage 1 through stage 4 chronic kidney disease, or unspecified chronic kidney disease: Secondary | ICD-10-CM | POA: Diagnosis not present

## 2018-04-18 DIAGNOSIS — I4510 Unspecified right bundle-branch block: Secondary | ICD-10-CM | POA: Diagnosis not present

## 2018-04-18 DIAGNOSIS — Z952 Presence of prosthetic heart valve: Secondary | ICD-10-CM | POA: Diagnosis not present

## 2018-04-18 DIAGNOSIS — I252 Old myocardial infarction: Secondary | ICD-10-CM | POA: Diagnosis not present

## 2018-04-18 DIAGNOSIS — I082 Rheumatic disorders of both aortic and tricuspid valves: Secondary | ICD-10-CM | POA: Diagnosis not present

## 2018-04-18 DIAGNOSIS — R042 Hemoptysis: Secondary | ICD-10-CM | POA: Diagnosis not present

## 2018-04-18 DIAGNOSIS — I272 Pulmonary hypertension, unspecified: Secondary | ICD-10-CM | POA: Diagnosis not present

## 2018-04-18 DIAGNOSIS — N183 Chronic kidney disease, stage 3 (moderate): Secondary | ICD-10-CM | POA: Diagnosis not present

## 2018-04-18 DIAGNOSIS — I481 Persistent atrial fibrillation: Secondary | ICD-10-CM | POA: Diagnosis not present

## 2018-04-18 DIAGNOSIS — M19011 Primary osteoarthritis, right shoulder: Secondary | ICD-10-CM | POA: Diagnosis not present

## 2018-04-18 DIAGNOSIS — I5042 Chronic combined systolic (congestive) and diastolic (congestive) heart failure: Secondary | ICD-10-CM | POA: Diagnosis not present

## 2018-04-18 DIAGNOSIS — K219 Gastro-esophageal reflux disease without esophagitis: Secondary | ICD-10-CM | POA: Diagnosis not present

## 2018-04-18 DIAGNOSIS — Z7901 Long term (current) use of anticoagulants: Secondary | ICD-10-CM | POA: Diagnosis not present

## 2018-04-18 DIAGNOSIS — I495 Sick sinus syndrome: Secondary | ICD-10-CM | POA: Diagnosis not present

## 2018-04-18 DIAGNOSIS — E785 Hyperlipidemia, unspecified: Secondary | ICD-10-CM | POA: Diagnosis not present

## 2018-04-19 DIAGNOSIS — I272 Pulmonary hypertension, unspecified: Secondary | ICD-10-CM | POA: Diagnosis not present

## 2018-04-19 DIAGNOSIS — Z950 Presence of cardiac pacemaker: Secondary | ICD-10-CM | POA: Diagnosis not present

## 2018-04-19 DIAGNOSIS — I082 Rheumatic disorders of both aortic and tricuspid valves: Secondary | ICD-10-CM | POA: Diagnosis not present

## 2018-04-19 DIAGNOSIS — I495 Sick sinus syndrome: Secondary | ICD-10-CM | POA: Diagnosis not present

## 2018-04-19 DIAGNOSIS — Z7901 Long term (current) use of anticoagulants: Secondary | ICD-10-CM | POA: Diagnosis not present

## 2018-04-19 DIAGNOSIS — R042 Hemoptysis: Secondary | ICD-10-CM | POA: Diagnosis not present

## 2018-04-19 DIAGNOSIS — I5042 Chronic combined systolic (congestive) and diastolic (congestive) heart failure: Secondary | ICD-10-CM | POA: Diagnosis not present

## 2018-04-19 DIAGNOSIS — Z952 Presence of prosthetic heart valve: Secondary | ICD-10-CM | POA: Diagnosis not present

## 2018-04-19 DIAGNOSIS — I481 Persistent atrial fibrillation: Secondary | ICD-10-CM | POA: Diagnosis not present

## 2018-04-19 DIAGNOSIS — M19011 Primary osteoarthritis, right shoulder: Secondary | ICD-10-CM | POA: Diagnosis not present

## 2018-04-19 DIAGNOSIS — Z8679 Personal history of other diseases of the circulatory system: Secondary | ICD-10-CM | POA: Diagnosis not present

## 2018-04-19 DIAGNOSIS — I252 Old myocardial infarction: Secondary | ICD-10-CM | POA: Diagnosis not present

## 2018-04-19 DIAGNOSIS — K219 Gastro-esophageal reflux disease without esophagitis: Secondary | ICD-10-CM | POA: Diagnosis not present

## 2018-04-19 DIAGNOSIS — N183 Chronic kidney disease, stage 3 (moderate): Secondary | ICD-10-CM | POA: Diagnosis not present

## 2018-04-19 DIAGNOSIS — I4892 Unspecified atrial flutter: Secondary | ICD-10-CM | POA: Diagnosis not present

## 2018-04-19 DIAGNOSIS — I4510 Unspecified right bundle-branch block: Secondary | ICD-10-CM | POA: Diagnosis not present

## 2018-04-19 DIAGNOSIS — I2510 Atherosclerotic heart disease of native coronary artery without angina pectoris: Secondary | ICD-10-CM | POA: Diagnosis not present

## 2018-04-19 DIAGNOSIS — I130 Hypertensive heart and chronic kidney disease with heart failure and stage 1 through stage 4 chronic kidney disease, or unspecified chronic kidney disease: Secondary | ICD-10-CM | POA: Diagnosis not present

## 2018-04-19 DIAGNOSIS — E785 Hyperlipidemia, unspecified: Secondary | ICD-10-CM | POA: Diagnosis not present

## 2018-04-20 ENCOUNTER — Telehealth: Payer: Self-pay | Admitting: Cardiovascular Disease

## 2018-04-20 DIAGNOSIS — I082 Rheumatic disorders of both aortic and tricuspid valves: Secondary | ICD-10-CM | POA: Diagnosis not present

## 2018-04-20 DIAGNOSIS — I272 Pulmonary hypertension, unspecified: Secondary | ICD-10-CM | POA: Diagnosis not present

## 2018-04-20 DIAGNOSIS — I481 Persistent atrial fibrillation: Secondary | ICD-10-CM | POA: Diagnosis not present

## 2018-04-20 DIAGNOSIS — I4892 Unspecified atrial flutter: Secondary | ICD-10-CM | POA: Diagnosis not present

## 2018-04-20 DIAGNOSIS — Z952 Presence of prosthetic heart valve: Secondary | ICD-10-CM | POA: Diagnosis not present

## 2018-04-20 DIAGNOSIS — K219 Gastro-esophageal reflux disease without esophagitis: Secondary | ICD-10-CM | POA: Diagnosis not present

## 2018-04-20 DIAGNOSIS — N183 Chronic kidney disease, stage 3 (moderate): Secondary | ICD-10-CM | POA: Diagnosis not present

## 2018-04-20 DIAGNOSIS — Z7901 Long term (current) use of anticoagulants: Secondary | ICD-10-CM | POA: Diagnosis not present

## 2018-04-20 DIAGNOSIS — R042 Hemoptysis: Secondary | ICD-10-CM | POA: Diagnosis not present

## 2018-04-20 DIAGNOSIS — E785 Hyperlipidemia, unspecified: Secondary | ICD-10-CM | POA: Diagnosis not present

## 2018-04-20 DIAGNOSIS — I5042 Chronic combined systolic (congestive) and diastolic (congestive) heart failure: Secondary | ICD-10-CM | POA: Diagnosis not present

## 2018-04-20 DIAGNOSIS — M19011 Primary osteoarthritis, right shoulder: Secondary | ICD-10-CM | POA: Diagnosis not present

## 2018-04-20 DIAGNOSIS — I252 Old myocardial infarction: Secondary | ICD-10-CM | POA: Diagnosis not present

## 2018-04-20 DIAGNOSIS — I495 Sick sinus syndrome: Secondary | ICD-10-CM | POA: Diagnosis not present

## 2018-04-20 DIAGNOSIS — I4510 Unspecified right bundle-branch block: Secondary | ICD-10-CM | POA: Diagnosis not present

## 2018-04-20 DIAGNOSIS — I130 Hypertensive heart and chronic kidney disease with heart failure and stage 1 through stage 4 chronic kidney disease, or unspecified chronic kidney disease: Secondary | ICD-10-CM | POA: Diagnosis not present

## 2018-04-20 DIAGNOSIS — Z8679 Personal history of other diseases of the circulatory system: Secondary | ICD-10-CM | POA: Diagnosis not present

## 2018-04-20 DIAGNOSIS — I2510 Atherosclerotic heart disease of native coronary artery without angina pectoris: Secondary | ICD-10-CM | POA: Diagnosis not present

## 2018-04-20 DIAGNOSIS — Z950 Presence of cardiac pacemaker: Secondary | ICD-10-CM | POA: Diagnosis not present

## 2018-04-20 NOTE — Telephone Encounter (Signed)
New Message    Pt c/o swelling: STAT is pt has developed SOB within 24 hours  1) How much weight have you gained and in what time span? 3lbs  2) If swelling, where is the swelling located? Legs   3) Are you currently taking a fluid pill? yes  4) Are you currently SOB? No   5) Do you have a log of your daily weights (if so, list)? 131, 134  Have you gained 3 pounds in a day or 5 pounds in a week? yes 6) Have you traveled recently? No

## 2018-04-20 NOTE — Telephone Encounter (Signed)
Returned call to patient of Dr. Loletha Grayer. She states she followed instructions from last week of increasing lasix to 40mg  x4 days. She states her weight came down a little. She reports she gained 3lbs overnight. She has leg swelling. Advised patient take additional lasix 20mg  daily (total of 40mg  today) and r/s her to see [place] PA on 8/1 @ 130pm. Advised that she bring log of home weights to appointment as well.

## 2018-04-21 ENCOUNTER — Encounter: Payer: Self-pay | Admitting: Physician Assistant

## 2018-04-21 ENCOUNTER — Ambulatory Visit: Payer: Medicare Other | Admitting: Physician Assistant

## 2018-04-21 VITALS — BP 111/65 | HR 73 | Ht 61.0 in | Wt 133.0 lb

## 2018-04-21 DIAGNOSIS — Z950 Presence of cardiac pacemaker: Secondary | ICD-10-CM | POA: Diagnosis not present

## 2018-04-21 DIAGNOSIS — I10 Essential (primary) hypertension: Secondary | ICD-10-CM | POA: Diagnosis not present

## 2018-04-21 DIAGNOSIS — I482 Chronic atrial fibrillation, unspecified: Secondary | ICD-10-CM

## 2018-04-21 DIAGNOSIS — Z952 Presence of prosthetic heart valve: Secondary | ICD-10-CM

## 2018-04-21 DIAGNOSIS — I5033 Acute on chronic diastolic (congestive) heart failure: Secondary | ICD-10-CM | POA: Diagnosis not present

## 2018-04-21 DIAGNOSIS — I2510 Atherosclerotic heart disease of native coronary artery without angina pectoris: Secondary | ICD-10-CM

## 2018-04-21 MED ORDER — FUROSEMIDE 40 MG PO TABS: 80.0000 mg | ORAL_TABLET | Freq: Two times a day (BID) | ORAL | 0 refills | Status: DC

## 2018-04-21 MED ORDER — POTASSIUM CHLORIDE CRYS ER 20 MEQ PO TBCR: 20.0000 meq | EXTENDED_RELEASE_TABLET | Freq: Three times a day (TID) | ORAL | 0 refills | Status: DC

## 2018-04-21 NOTE — Progress Notes (Signed)
Cardiology Office Note    Date:  04/23/2018   ID:  JADIN KAGEL, DOB January 07, 1941, MRN [id number]  PCP:  Lucianne Lei, MD  Cardiologist:  Dr. Sallyanne Kuster   Chief Complaint  Patient presents with  . Follow-up    seen for Dr. Sallyanne Kuster.     History of Present Illness:  Carrie [name] is a 77 y.o. 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  Patient had a NSTEMI in January 2018 and required DES placement to ostial RCA.  Echocardiogram obtained in January 2018 showed EF 50 to 55%, mitral valve prosthesis present, severely dilated left atrium.  Myoview in August 2018 showed EF 67%, no evidence of ischemia or prior infarction.  More recently, patient was admitted in July 2019 with increasing shortness of breath.  On initial arrival, INR was 2.8, BNP elevated at 1781.  Chest x-ray showed edema with small right pleural effusion.  She was admitted for congestive heart failure and underwent IV diuresis.  Repeat echocardiogram shows ejection fraction has improved to 60 to 65%.  She was eventually discharged on 04/01/2018 with a dry weight of 118 pounds.  It is unclear to me why she was discharged on 20 mg daily of Lasix as she was on 60 mg twice daily of Lasix prior to going to the hospital.  During the hospitalization, she was treated with 80 mg a.m. and 60 mg p.m. of IV Lasix.   Patient presents today for cardiology office visit.  She was initially taking 20 mg daily of Lasix after discharge, however quickly had a significant weight gain, she later increase her Lasix to 40 mg daily, and currently take Lasix at 20 mg 3 times daily dose.  Since she already became volume overloaded on Lasix 60 mg twice daily prior to going to the hospital, I  will start her on 80 mg twice daily of Lasix instead.  I also increased her potassium to 20 mEq 3 times daily.  I will obtain a basic metabolic panel today and also in 1 week.  I will see the patient back in 1 week for further assessment.  Even though her current weight is higher than the previous admission weight, the only reason I did not send the patient to the hospital is because she does not have paroxysmal nocturnal dyspnea and her O2 saturation was 99% in the clinic.    Past Medical History:  Diagnosis Date  . Anticoagulated on Coumadin    for mech valve and atrial fib  goal 2.0-2.5  . Anxiety   . Arthritis    "right shoulder" (03/15/2013)  . Atrial fibrillation ([place])   . Atrial flutter ([place])   . CAD (coronary artery disease) 08/11/2016  . CHF (congestive heart failure) ([place])   . Chronic combined systolic and diastolic CHF (congestive heart failure) ([place])    a. 02/2016 Echo: EF 45-50%.  . Diverticula of colon   . Exertional shortness of breath   . GERD (gastroesophageal reflux disease)   . Heart murmur   . Hemorrhage intraabdominal 03/17/2012  . History of blood transfusion    "once" (03/15/2013)  . Hyperlipidemia   . Hypertension   . Liver hemorrhage   . Migraines   . 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 Bi-leaflet St. Jude mechanical prosthesis;  b. 02/2016 Echo: EF 45-50%, some degree of MR, sev dil LA/RA, sev TR.  . NSTEMI (non-ST elevated myocardial infarction) ([place])    a. 02/2016 elev trop/Cath: nonobs dzs,   . Pacemaker    medtronic adapta  . Pneumonia 2009   resolved.? OPD Rx  . Severe tricuspid regurgitation    a. 02/2016 Echo: Ef 45-50%, sev TR, PASP 21mmHg.  . Sick sinus syndrome ([place])    Dr Cristopher Peru. EP study negative. Pacemaker 12/15/06 Medtronic  . Stroke [facility])    "they say I had a stroke last year" denies residual on 03/15/2013  . Subdural hematoma [facility])     Past Surgical History:  Procedure Laterality Date  . APPENDECTOMY    . CARDIAC  CATHETERIZATION  04/02/97   R&L:severe MR/pulmonary hypertension  . CARDIAC CATHETERIZATION N/A 03/16/2016   Procedure: Left Heart Cath and Coronary Angiography;  Surgeon: [name], MD;  Location: [place] CV LAB;  Service: Cardiovascular;  Laterality: N/A;  . CARDIAC CATHETERIZATION N/A 09/30/2016   Procedure: Left Heart Cath and Coronary Angiography;  Surgeon: [name], MD;  Location: [place] CV LAB;  Service: Cardiovascular;  Laterality: N/A;  . CARDIAC CATHETERIZATION N/A 09/30/2016   Procedure: Coronary Stent Intervention;  Surgeon: [name], MD;  3.5 x 12 mm resolute Onyx  to ostial RCA  . CATARACT EXTRACTION W/ INTRAOCULAR LENS  IMPLANT, BILATERAL Bilateral 2013  . CORONARY ANGIOPLASTY    . DILATION AND CURETTAGE OF UTERUS     "had fibroids" (03/15/2013)  . EXPLORATORY LAPAROTOMY     "had a growth on my intestines" (03/15/2013)  . HAMMER TOE SURGERY Right   . INSERT / REPLACE / REMOVE PACEMAKER  12/15/2006   Medtronic  . MITRAL VALVE REPLACEMENT  1998   St Jude mechanical; Dr. [name]  . PACEMAKER PLACEMENT  12/15/06   medtronic adapta for SSS  . PERMANENT PACEMAKER GENERATOR CHANGE N/A 07/24/2014   Procedure: PERMANENT PACEMAKER GENERATOR CHANGE;  Surgeon: [name], MD;  Location: [place] CATH LAB;  Service: Cardiovascular;  Laterality: N/A;  . PERSANTINE CARDIOLITE  08/07/03   mild inf. ischemia   . TEE WITHOUT CARDIOVERSION  09/23/2011   Procedure: TRANSESOPHAGEAL ECHOCARDIOGRAM (TEE);  Surgeon: [name];  Location: [facility] ENDOSCOPY;  Service: Cardiovascular;  Laterality: N/A;  . TONSILLECTOMY    . TUBAL LIGATION    . US ECHOCARDIOGRAPHY  11/19/2011   EF 50-55%,RA mod to severely dilated,LA severely dilated,trace MR,small vegetation or mass on the MV,AOV mildly scleroticmild PI, RV pressure 40-89mmHg    Current Medications: Outpatient Medications Prior to Visit  Medication Sig Dispense Refill  . atorvastatin (LIPITOR) 40 MG tablet Take 1  tablet (40 mg total) by mouth daily at 6 PM. 30 tablet 12  . Biotin 1000 MCG tablet Take 1,000 mcg by mouth daily.    Marland Kitchen CARTIA XT 120 MG 24 hr capsule TAKE ONE CAPSULE BY MOUTH DAILY 90 capsule 2  . dextromethorphan-guaiFENesin (MUCINEX DM) 30-600 MG 12hr tablet Take 1 tablet by mouth 2 (two) times daily as needed for cough. 20 tablet 0  . docusate sodium (COLACE) 100 MG capsule Take 200 mg by mouth 2 (two) times daily.     . metoprolol tartrate (LOPRESSOR) 25 MG tablet Take 1 tablet (25 mg total) by mouth 2 (two) times daily. 180 tablet 3  . Multiple Vitamin (MULTIVITAMINS PO) Take 1 tablet by mouth daily.     . nitroGLYCERIN (NITROSTAT) 0.4 MG SL tablet Place 1 tablet (0.4  mg total) under the tongue every 5 (five) minutes as needed for chest pain. 225 tablet 0  . Omega-3 Fatty Acids (FISH OIL) 1000 MG CAPS Take 1,000 mg by mouth daily.    . polyethylene glycol (MIRALAX / GLYCOLAX) packet Take 17 g by mouth as needed (for constipation). Reported on 03/25/2016    . warfarin (COUMADIN) 5 MG tablet TAKE 1 TO 1 AND 1/2 TABLETS BY MOUTH DAILY AS DIRECTED (Patient taking differently: TAKE 5MG  DAILY EXCEPT ON MONDAYS TAKE 7.5MG .) 120 tablet 1  . furosemide (LASIX) 20 MG tablet Take 1 tablet (20 mg total) by mouth daily. 30 tablet 1  . potassium chloride SA (K-DUR,KLOR-CON) 20 MEQ tablet TAKE 1 TABLET BY MOUTH DAILY 30 tablet 11  . amoxicillin-clavulanate (AUGMENTIN) 875-125 MG tablet Take 1 tablet by mouth every 12 (twelve) hours. 10 tablet 0   No facility-administered medications prior to visit.      Allergies:   Codeine   Social History   Socioeconomic History  . Marital status: Married    Spouse name: Not on file  . Number of children: Not on file  . Years of education: Not on file  . Highest education level: Not on file  Occupational History  . Not on file  Social Needs  . Financial resource strain: Not on file  . Food insecurity:    Worry: Not on file    Inability: Not on file  .  Transportation needs:    Medical: Not on file    Non-medical: Not on file  Tobacco Use  . Smoking status: Never Smoker  . Smokeless tobacco: Never Used  Substance and Sexual Activity  . Alcohol use: No  . Drug use: No  . Sexual activity: Never  Lifestyle  . Physical activity:    Days per week: Not on file    Minutes per session: Not on file  . Stress: Not on file  Relationships  . Social connections:    Talks on phone: Not on file    Gets together: Not on file    Attends religious service: Not on file    Active member of club or organization: Not on file    Attends meetings of clubs or organizations: Not on file    Relationship status: Not on file  Other Topics Concern  . Not on file  Social History Narrative  . Not on file     Family History:  The patient's family history includes Cancer in her mother and sister; Diabetes in her sister; Healthy in her brother, brother, brother, and sister; Heart attack in her maternal grandfather; Kidney disease in her daughter; Leukemia in her sister; Stroke in her brother and father.   ROS:   Please see the history of present illness.    ROS All other systems reviewed and are negative.   PHYSICAL EXAM:   VS:  BP 111/65   Pulse 73   Ht 5\' 1"  (1.549 m)   Wt 133 lb (60.3 kg)   BMI 25.13 kg/m    GEN: Well nourished, well developed, in no acute distress  HEENT: normal  Neck: no JVD, carotid bruits, or masses Cardiac: Irregularly irregular; no rubs, or gallops. 2-3+ edema, 2/6 systolic murmur and click near apex  Respiratory:  clear to auscultation bilaterally, normal work of breathing GI: soft, nontender, nondistended, + BS MS: no deformity or atrophy  Skin: warm and dry, no rash Neuro:  Alert and Oriented x 3, Strength and sensation are intact Psych: euthymic  mood, full affect  Wt Readings from Last 3 Encounters:  04/21/18 133 lb (60.3 kg)  04/01/18 118 lb 1.6 oz (53.6 kg)  10/06/17 125 lb 12.8 oz (57.1 kg)       Studies/Labs Reviewed:   EKG:  EKG is not ordered today.    Recent Labs: 05/11/2017: Magnesium 2.5 03/30/2018: ALT 28; B Natriuretic Peptide 1,780.8; Hemoglobin 9.0; Platelets 212 04/21/2018: BUN 20; Creatinine, Ser 1.04; Potassium 4.8; Sodium 135   Lipid Panel    Component Value Date/Time   CHOL 87 03/31/2018 0112   TRIG 46 03/31/2018 0112   HDL 31 (L) 03/31/2018 0112   CHOLHDL 2.8 03/31/2018 0112   VLDL 9 03/31/2018 0112   LDLCALC 47 03/31/2018 0112    Additional studies/ records that were reviewed today include:   Echo 03/31/2018 LV EF: 60% -   65% Study Conclusions  - Left ventricle: Septal flattening consistent with RV volume /   pressure overload. The cavity size was normal. Wall thickness was   normal. Systolic function was normal. The estimated ejection   fraction was in the range of 60% to 65%. - Aortic valve: There was mild regurgitation. - Mitral valve: MV prosthesis is well seated Motion is difficult to   see Peak and mean gradients through the valve are 19 and 6 mm Hg   respectively. Valve area by continuity equation (using LVOT   flow): 1.21 cm^2. - Left atrium: The atrium was severely dilated. - Right ventricle: The cavity size was moderately dilated. Systolic   function was mildly reduced. - Right atrium: The atrium was severely dilated. - Tricuspid valve: There was severe regurgitation. - Pulmonary arteries: PA peak pressure: 66 mm Hg (S).   ASSESSMENT:    1. Acute on chronic diastolic (congestive) heart failure ([place])   2. H/O mitral valve replacement with mechanical valve   3. Pacemaker   4. Chronic atrial fibrillation (HCC)   5. Essential hypertension   6. Coronary artery disease involving native coronary artery of native heart without angina pectoris      PLAN:  In order of problems listed above:  1. Acute on chronic diastolic heart failure: She is clearly volume overloaded, her dry weight is 118 pounds, her current weight is 133 pounds.   She has at least 2-3+ pitting edema in bilateral lower extremity, fortunately her lung is clear and her O2 saturation is 99%.  It is unclear to me why she was recently discharged on 20 mg daily of Lasix as she was on 60 mg twice daily of Lasix prior to going to the hospital with acute heart failure.  During the hospitalization she was diuresed with 80 mg a.m. and 60 mg p.m. of IV Lasix.  Since her discharge, she has had a significant fluid accumulation, I will start her on 80 mg twice daily of p.o. Lasix.  I also increased her potassium supplement to 20 mEq 3 times daily.  She will need a basic metabolic panel today and also in 1 week.  I will see her in 1 week for follow-up.  2. History of mechanical mitral valve: Continue on Coumadin  3. History of pacemaker: Followed by Dr. Sallyanne Kuster  4. Chronic atrial fibrillation: On Coumadin.  Rate controlled on metoprolol tartrate 12.5 mg twice daily  5. CAD: Denies any chest discomfort, not on aspirin given the need for Coumadin.  Continue Lipitor 40 mg daily  6. Hypertension: Blood pressure well controlled on current medication.    Medication Adjustments/Labs and Tests  Ordered: Current medicines are reviewed at length with the patient today.  Concerns regarding medicines are outlined above.  Medication changes, Labs and Tests ordered today are listed in the Patient Instructions below. Patient Instructions  Medication Instructions: Increase Potassium to 1 tab (20 mEq) three times daily--morning, afternoon, and at bedtime.  Increase furosemide to 80 mg (two 40 mg tablets) twice daily.   Labwork: Your physician recommends that you return for lab work today, and when you return for your next appointment with Isaac Laud.   Follow-Up: Your physician recommends that you schedule a follow-up appointment on Wednesday, 04/27/18 with Almyra Deforest, PA at 1:30 PM.   Any Other Special Instructions are listed below:  Avoid salt.  If you get to 120 lbs, call Isaac Laud to let  him know. Our office number is [phone].   If you need a refill on your cardiac medications before your next appointment, please call your pharmacy.     Hilbert Corrigan, [place]  04/23/2018 9:53 AM    [address], [place], [place]  [address] Phone: [phone]; Fax: [phone]

## 2018-04-21 NOTE — Patient Instructions (Signed)
Medication Instructions: Increase Potassium to 1 tab (20 mEq) three times daily--morning, afternoon, and at bedtime.  Increase furosemide to 80 mg (two 40 mg tablets) twice daily.   Labwork: Your physician recommends that you return for lab work today, and when you return for your next appointment with Isaac Laud.   Follow-Up: Your physician recommends that you schedule a follow-up appointment on Wednesday, 04/27/18 with Almyra Deforest, PA at 1:30 PM.   Any Other Special Instructions are listed below:  Avoid salt.  If you get to 120 lbs, call Isaac Laud to let him know. Our office number is [phone].   If you need a refill on your cardiac medications before your next appointment, please call your pharmacy.

## 2018-04-22 ENCOUNTER — Ambulatory Visit: Payer: Medicare Other | Admitting: Physician Assistant

## 2018-04-22 LAB — BASIC METABOLIC PANEL
BUN/Creatinine Ratio: 19 (ref 12–28)
BUN: 20 mg/dL (ref 8–27)
CO2: 19 mmol/L — AB (ref 20–29)
CREATININE: 1.04 mg/dL — AB (ref 0.57–1.00)
Calcium: 9.8 mg/dL (ref 8.7–10.3)
Chloride: 100 mmol/L (ref 96–106)
GFR calc non Af Amer: 52 mL/min/{1.73_m2} — ABNORMAL LOW (ref >59)
GFR, EST AFRICAN AMERICAN: 60 mL/min/{1.73_m2} (ref >59)
Glucose: 91 mg/dL (ref 65–99)
Potassium: 4.8 mmol/L (ref 3.5–5.2)
SODIUM: 135 mmol/L (ref 134–144)

## 2018-04-22 NOTE — Telephone Encounter (Signed)
Patient was seen, medication adjustment made

## 2018-04-22 NOTE — Progress Notes (Signed)
Stable renal function and electrolyte

## 2018-04-23 ENCOUNTER — Encounter: Payer: Self-pay | Admitting: Physician Assistant

## 2018-04-26 DIAGNOSIS — N183 Chronic kidney disease, stage 3 (moderate): Secondary | ICD-10-CM | POA: Diagnosis not present

## 2018-04-26 DIAGNOSIS — I5042 Chronic combined systolic (congestive) and diastolic (congestive) heart failure: Secondary | ICD-10-CM | POA: Diagnosis not present

## 2018-04-26 DIAGNOSIS — K219 Gastro-esophageal reflux disease without esophagitis: Secondary | ICD-10-CM | POA: Diagnosis not present

## 2018-04-26 DIAGNOSIS — I495 Sick sinus syndrome: Secondary | ICD-10-CM | POA: Diagnosis not present

## 2018-04-26 DIAGNOSIS — I272 Pulmonary hypertension, unspecified: Secondary | ICD-10-CM | POA: Diagnosis not present

## 2018-04-26 DIAGNOSIS — Z950 Presence of cardiac pacemaker: Secondary | ICD-10-CM | POA: Diagnosis not present

## 2018-04-26 DIAGNOSIS — I4892 Unspecified atrial flutter: Secondary | ICD-10-CM | POA: Diagnosis not present

## 2018-04-26 DIAGNOSIS — I082 Rheumatic disorders of both aortic and tricuspid valves: Secondary | ICD-10-CM | POA: Diagnosis not present

## 2018-04-26 DIAGNOSIS — M19011 Primary osteoarthritis, right shoulder: Secondary | ICD-10-CM | POA: Diagnosis not present

## 2018-04-26 DIAGNOSIS — Z7901 Long term (current) use of anticoagulants: Secondary | ICD-10-CM | POA: Diagnosis not present

## 2018-04-26 DIAGNOSIS — R042 Hemoptysis: Secondary | ICD-10-CM | POA: Diagnosis not present

## 2018-04-26 DIAGNOSIS — Z952 Presence of prosthetic heart valve: Secondary | ICD-10-CM | POA: Diagnosis not present

## 2018-04-26 DIAGNOSIS — Z8679 Personal history of other diseases of the circulatory system: Secondary | ICD-10-CM | POA: Diagnosis not present

## 2018-04-26 DIAGNOSIS — I481 Persistent atrial fibrillation: Secondary | ICD-10-CM | POA: Diagnosis not present

## 2018-04-26 DIAGNOSIS — E785 Hyperlipidemia, unspecified: Secondary | ICD-10-CM | POA: Diagnosis not present

## 2018-04-26 DIAGNOSIS — I130 Hypertensive heart and chronic kidney disease with heart failure and stage 1 through stage 4 chronic kidney disease, or unspecified chronic kidney disease: Secondary | ICD-10-CM | POA: Diagnosis not present

## 2018-04-26 DIAGNOSIS — I4510 Unspecified right bundle-branch block: Secondary | ICD-10-CM | POA: Diagnosis not present

## 2018-04-26 DIAGNOSIS — I2510 Atherosclerotic heart disease of native coronary artery without angina pectoris: Secondary | ICD-10-CM | POA: Diagnosis not present

## 2018-04-26 DIAGNOSIS — I252 Old myocardial infarction: Secondary | ICD-10-CM | POA: Diagnosis not present

## 2018-04-27 ENCOUNTER — Ambulatory Visit: Payer: Medicare Other | Admitting: Physician Assistant

## 2018-04-27 ENCOUNTER — Encounter: Payer: Self-pay | Admitting: Physician Assistant

## 2018-04-27 VITALS — BP 116/56 | HR 58 | Ht 61.0 in | Wt 127.0 lb

## 2018-04-27 DIAGNOSIS — I4892 Unspecified atrial flutter: Secondary | ICD-10-CM | POA: Diagnosis not present

## 2018-04-27 DIAGNOSIS — I252 Old myocardial infarction: Secondary | ICD-10-CM | POA: Diagnosis not present

## 2018-04-27 DIAGNOSIS — I482 Chronic atrial fibrillation, unspecified: Secondary | ICD-10-CM

## 2018-04-27 DIAGNOSIS — I4510 Unspecified right bundle-branch block: Secondary | ICD-10-CM | POA: Diagnosis not present

## 2018-04-27 DIAGNOSIS — I2510 Atherosclerotic heart disease of native coronary artery without angina pectoris: Secondary | ICD-10-CM | POA: Diagnosis not present

## 2018-04-27 DIAGNOSIS — Z7901 Long term (current) use of anticoagulants: Secondary | ICD-10-CM | POA: Diagnosis not present

## 2018-04-27 DIAGNOSIS — Z8679 Personal history of other diseases of the circulatory system: Secondary | ICD-10-CM | POA: Diagnosis not present

## 2018-04-27 DIAGNOSIS — N183 Chronic kidney disease, stage 3 (moderate): Secondary | ICD-10-CM | POA: Diagnosis not present

## 2018-04-27 DIAGNOSIS — I082 Rheumatic disorders of both aortic and tricuspid valves: Secondary | ICD-10-CM | POA: Diagnosis not present

## 2018-04-27 DIAGNOSIS — Z950 Presence of cardiac pacemaker: Secondary | ICD-10-CM

## 2018-04-27 DIAGNOSIS — I5042 Chronic combined systolic (congestive) and diastolic (congestive) heart failure: Secondary | ICD-10-CM | POA: Diagnosis not present

## 2018-04-27 DIAGNOSIS — I130 Hypertensive heart and chronic kidney disease with heart failure and stage 1 through stage 4 chronic kidney disease, or unspecified chronic kidney disease: Secondary | ICD-10-CM | POA: Diagnosis not present

## 2018-04-27 DIAGNOSIS — E785 Hyperlipidemia, unspecified: Secondary | ICD-10-CM | POA: Diagnosis not present

## 2018-04-27 DIAGNOSIS — I5032 Chronic diastolic (congestive) heart failure: Secondary | ICD-10-CM | POA: Diagnosis not present

## 2018-04-27 DIAGNOSIS — I272 Pulmonary hypertension, unspecified: Secondary | ICD-10-CM | POA: Diagnosis not present

## 2018-04-27 DIAGNOSIS — Z952 Presence of prosthetic heart valve: Secondary | ICD-10-CM

## 2018-04-27 DIAGNOSIS — I495 Sick sinus syndrome: Secondary | ICD-10-CM | POA: Diagnosis not present

## 2018-04-27 DIAGNOSIS — I481 Persistent atrial fibrillation: Secondary | ICD-10-CM | POA: Diagnosis not present

## 2018-04-27 DIAGNOSIS — I10 Essential (primary) hypertension: Secondary | ICD-10-CM

## 2018-04-27 DIAGNOSIS — M19011 Primary osteoarthritis, right shoulder: Secondary | ICD-10-CM | POA: Diagnosis not present

## 2018-04-27 DIAGNOSIS — R042 Hemoptysis: Secondary | ICD-10-CM | POA: Diagnosis not present

## 2018-04-27 DIAGNOSIS — K219 Gastro-esophageal reflux disease without esophagitis: Secondary | ICD-10-CM | POA: Diagnosis not present

## 2018-04-27 MED ORDER — FUROSEMIDE 40 MG PO TABS: ORAL_TABLET | ORAL | 0 refills | Status: DC

## 2018-04-27 MED ORDER — POTASSIUM CHLORIDE CRYS ER 20 MEQ PO TBCR: 40.0000 meq | EXTENDED_RELEASE_TABLET | Freq: Two times a day (BID) | ORAL | 0 refills | Status: DC

## 2018-04-27 NOTE — Progress Notes (Addendum)
Cardiology Office Note    Date:  04/29/2018   ID:  Carrie [name], DOB July 28, 1941, MRN [id number]  PCP:  Lucianne Lei, MD  Cardiologist:  Dr. Sallyanne Kuster   Chief Complaint  Patient presents with  . 1 week f/u    pt states she is feeling much better    History of Present Illness:  Carrie [name] is a 77 y.o. 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  Patient had a NSTEMI in January 2018 and required DES placement to ostial RCA.  Echocardiogram obtained in January 2018 showed EF 50 to 55%, mitral valve prosthesis present, severely dilated left atrium.  Myoview in August 2018 showed EF 67%, no evidence of ischemia or prior infarction.  More recently, patient was admitted in July 2019 with increasing shortness of breath.  On initial arrival, INR was 2.8, BNP elevated at 1781.  Chest x-ray showed edema with small right pleural effusion.  She was admitted for congestive heart failure and underwent IV diuresis.  Repeat echocardiogram shows ejection fraction has improved to 60 to 65%.  She was eventually discharged on 04/01/2018 with a dry weight of 118 pounds.  It is unclear to me why she was discharged on 20 mg daily of Lasix as she was on 60 mg twice daily of Lasix prior to going to the hospital.  During the hospitalization, she was treated with 80 mg a.m. and 60 mg p.m. of IV Lasix.   I last saw the patient on 04/21/2018.  After her recent discharge, she was initially taking 20 mg daily of Lasix, however quickly had a significant weight gain.  She later increase her Lasix to 40 mg daily.  During the office visit, her weight has increased from 118 pounds to 133 pounds.  I increased her Lasix to 80 mg twice daily and increased her  potassium to 20 mEq 3 times daily.  She is due for basic metabolic panel today.  Patient presents today for cardiology office visit.  She continued to have 2-3+ pitting edema in bilateral lower extremity, her shortness of breath is getting better.  She denies any orthopnea or PND.  Her lung is clear on physical exam.  Her weight has decreased from the previous 133 pounds down to 127 pounds.  She is still volume overloaded at this time however improving.  My target goal for her weight is between 118 pounds to 122 pounds.  I initially wanted to increase her diuretic to 120 mg a.m. and 80 mg p.m. of Lasix and the increase potassium to 40 mEq twice daily.  However during today's blood draw for lab work, lab tech had a hard time controlling her blood and if this is concerning for dehydration.  So for now, I have reduced her Lasix to 80 mg daily and potassium to 20 mcg twice daily.  She will await for further instructions based on lab work results.  It is curious why she would be dehydrated when she is still 9 pounds over her dry weight.   Past Medical History:  Diagnosis Date  . Anticoagulated on Coumadin    for mech valve and atrial fib  goal 2.0-2.5  . Anxiety   . Arthritis    "right shoulder" (03/15/2013)  . Atrial fibrillation ([place])   . Atrial flutter (  [place])   . CAD (coronary artery disease) 08/11/2016  . CHF (congestive heart failure) ([place])   . Chronic combined systolic and diastolic CHF (congestive heart failure) ([place])    a. 02/2016 Echo: EF 45-50%.  . Diverticula of colon   . Exertional shortness of breath   . GERD (gastroesophageal reflux disease)   . Heart murmur   . Hemorrhage intraabdominal 03/17/2012  . History of blood transfusion    "once" (03/15/2013)  . Hyperlipidemia   . Hypertension   . Liver hemorrhage   . Migraines   . 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 Bi-leaflet St. Jude mechanical prosthesis; b. 02/2016 Echo: EF 45-50%, some degree of MR, sev dil LA/RA,  sev TR.  . NSTEMI (non-ST elevated myocardial infarction) ([place])    a. 02/2016 elev trop/Cath: nonobs dzs,   . Pacemaker    medtronic adapta  . Pneumonia 2009   resolved.? OPD Rx  . Severe tricuspid regurgitation    a. 02/2016 Echo: Ef 45-50%, sev TR, PASP 76mmHg.  . Sick sinus syndrome ([place])    Dr Cristopher Peru. EP study negative. Pacemaker 12/15/06 Medtronic  . Stroke [facility])    "they say I had a stroke last year" denies residual on 03/15/2013  . Subdural hematoma [facility])     Past Surgical History:  Procedure Laterality Date  . APPENDECTOMY    . CARDIAC CATHETERIZATION  04/02/97   R&L:severe MR/pulmonary hypertension  . CARDIAC CATHETERIZATION N/A 03/16/2016   Procedure: Left Heart Cath and Coronary Angiography;  Surgeon: [name], MD;  Location: [facility];  Service: Cardiovascular;  Laterality: N/A;  . CARDIAC CATHETERIZATION N/A 09/30/2016   Procedure: Left Heart Cath and Coronary Angiography;  Surgeon: [name], MD;  Location: [facility];  Service: Cardiovascular;  Laterality: N/A;  . CARDIAC CATHETERIZATION N/A 09/30/2016   Procedure: Coronary Stent Intervention;  Surgeon: [name], MD;  3.5 x 12 mm resolute [place]  to ostial RCA  . CATARACT EXTRACTION W/ INTRAOCULAR LENS  IMPLANT, BILATERAL Bilateral 2013  . CORONARY ANGIOPLASTY    . DILATION AND CURETTAGE OF UTERUS     "had fibroids" (03/15/2013)  . EXPLORATORY LAPAROTOMY     "had a growth on my intestines" (03/15/2013)  . HAMMER TOE SURGERY Right   . INSERT / REPLACE / REMOVE PACEMAKER  12/15/2006   Medtronic  . MITRAL VALVE REPLACEMENT  1998   St Jude mechanical; Dr. [name]  . PACEMAKER PLACEMENT  12/15/06   medtronic adapta for SSS  . PERMANENT PACEMAKER GENERATOR CHANGE N/A 07/24/2014   Procedure: PERMANENT PACEMAKER GENERATOR CHANGE;  Surgeon: [name], MD;  Location: [facility];  Service: Cardiovascular;  Laterality: N/A;  . PERSANTINE CARDIOLITE  08/07/03   mild inf. ischemia     . TEE WITHOUT CARDIOVERSION  09/23/2011   Procedure: TRANSESOPHAGEAL ECHOCARDIOGRAM (TEE);  Surgeon: [name];  Location: [facility];  Service: Cardiovascular;  Laterality: N/A;  . TONSILLECTOMY    . TUBAL LIGATION    . US ECHOCARDIOGRAPHY  11/19/2011   EF 50-55%,RA mod to severely dilated,LA severely dilated,trace MR,small vegetation or mass on the MV,AOV mildly scleroticmild PI, RV pressure 40-5mmHg    Current Medications: Outpatient Medications Prior to Visit  Medication Sig Dispense Refill  . atorvastatin (LIPITOR) 40 MG tablet Take 1 tablet (40 mg total) by mouth daily at 6 PM. 30 tablet 12  . Biotin 1000 MCG tablet Take 1,000 mcg by mouth daily.    Marland Kitchen  CARTIA XT 120 MG 24 hr capsule TAKE ONE CAPSULE BY MOUTH DAILY 90 capsule 2  . dextromethorphan-guaiFENesin (MUCINEX DM) 30-600 MG 12hr tablet Take 1 tablet by mouth 2 (two) times daily as needed for cough. 20 tablet 0  . docusate sodium (COLACE) 100 MG capsule Take 200 mg by mouth 2 (two) times daily.     . metoprolol tartrate (LOPRESSOR) 25 MG tablet Take 1 tablet (25 mg total) by mouth 2 (two) times daily. 180 tablet 3  . Multiple Vitamin (MULTIVITAMINS PO) Take 1 tablet by mouth daily.     . nitroGLYCERIN (NITROSTAT) 0.4 MG SL tablet Place 1 tablet (0.4 mg total) under the tongue every 5 (five) minutes as needed for chest pain. 225 tablet 0  . Omega-3 Fatty Acids (FISH OIL) 1000 MG CAPS Take 1,000 mg by mouth daily.    . polyethylene glycol (MIRALAX / GLYCOLAX) packet Take 17 g by mouth as needed (for constipation). Reported on 03/25/2016    . warfarin (COUMADIN) 5 MG tablet TAKE 1 TO 1 AND 1/2 TABLETS BY MOUTH DAILY AS DIRECTED (Patient taking differently: TAKE 5MG  DAILY EXCEPT ON MONDAYS TAKE 7.5MG .) 120 tablet 1  . furosemide (LASIX) 40 MG tablet Take 2 tablets (80 mg total) by mouth 2 (two) times daily. 120 tablet 0  . potassium chloride SA (K-DUR,KLOR-CON) 20 MEQ tablet Take 1 tablet (20 mEq total) by mouth 3 (three) times  daily. 90 tablet 0   No facility-administered medications prior to visit.      Allergies:   Codeine   Social History   Socioeconomic History  . Marital status: Married    Spouse name: Not on file  . Number of children: Not on file  . Years of education: Not on file  . Highest education level: Not on file  Occupational History  . Not on file  Social Needs  . Financial resource strain: Not on file  . Food insecurity:    Worry: Not on file    Inability: Not on file  . Transportation needs:    Medical: Not on file    Non-medical: Not on file  Tobacco Use  . Smoking status: Never Smoker  . Smokeless tobacco: Never Used  Substance and Sexual Activity  . Alcohol use: No  . Drug use: No  . Sexual activity: Never  Lifestyle  . Physical activity:    Days per week: Not on file    Minutes per session: Not on file  . Stress: Not on file  Relationships  . Social connections:    Talks on phone: Not on file    Gets together: Not on file    Attends religious service: Not on file    Active member of club or organization: Not on file    Attends meetings of clubs or organizations: Not on file    Relationship status: Not on file  Other Topics Concern  . Not on file  Social History Narrative  . Not on file     Family History:  The patient's family history includes Cancer in her mother and sister; Diabetes in her sister; Healthy in her brother, brother, brother, and sister; Heart attack in her maternal grandfather; Kidney disease in her daughter; Leukemia in her sister; Stroke in her brother and father.   ROS:   Please see the history of present illness.    ROS All other systems reviewed and are negative.   PHYSICAL EXAM:   VS:  BP (!) 116/56   Pulse Marland Kitchen)  58   Ht 5\' 1"  (1.549 m)   Wt 127 lb (57.6 kg)   BMI 24.00 kg/m    GEN: Well nourished, well developed, in no acute distress  HEENT: normal  Neck: no JVD, carotid bruits, or masses Cardiac: Irregular; no murmurs, rubs, or  gallops. 2-3+ edema  Respiratory:  clear to auscultation bilaterally, normal work of breathing GI: soft, nontender, nondistended, + BS MS: no deformity or atrophy  Skin: warm and dry, no rash Neuro:  Alert and Oriented x 3, Strength and sensation are intact Psych: euthymic mood, full affect  Wt Readings from Last 3 Encounters:  04/27/18 127 lb (57.6 kg)  04/21/18 133 lb (60.3 kg)  04/01/18 118 lb 1.6 oz (53.6 kg)      Studies/Labs Reviewed:   EKG:  EKG is not ordered today.   Recent Labs: 05/11/2017: Magnesium 2.5 03/30/2018: ALT 28; B Natriuretic Peptide 1,780.8; Hemoglobin 9.0; Platelets 212 04/27/2018: BUN 30; Creatinine, Ser 1.21; Potassium 5.1; Sodium 143   Lipid Panel    Component Value Date/Time   CHOL 87 03/31/2018 0112   TRIG 46 03/31/2018 0112   HDL 31 (L) 03/31/2018 0112   CHOLHDL 2.8 03/31/2018 0112   VLDL 9 03/31/2018 0112   LDLCALC 47 03/31/2018 0112    Additional studies/ records that were reviewed today include:   Echo 03/31/2018 LV EF: 60% -   65%  ------------------------------------------------------------------- Indications:      CHF - 428.0.  ------------------------------------------------------------------- History:   PMH:   Atrial fibrillation.  Coronary artery disease. PMH:   Myocardial infarction.  Risk factors:  Hypertension. Dyslipidemia.  ------------------------------------------------------------------- Study Conclusions  - Left ventricle: Septal flattening consistent with RV volume /   pressure overload. The cavity size was normal. Wall thickness was   normal. Systolic function was normal. The estimated ejection   fraction was in the range of 60% to 65%. - Aortic valve: There was mild regurgitation. - Mitral valve: MV prosthesis is well seated Motion is difficult to   see Peak and mean gradients through the valve are 19 and 6 mm Hg   respectively. Valve area by continuity equation (using LVOT   flow): 1.21 cm^2. - Left atrium:  The atrium was severely dilated. - Right ventricle: The cavity size was moderately dilated. Systolic   function was mildly reduced. - Right atrium: The atrium was severely dilated. - Tricuspid valve: There was severe regurgitation. - Pulmonary arteries: PA peak pressure: 66 mm Hg (S).   ASSESSMENT:    1. Chronic diastolic heart failure ([place])   2. Coronary artery disease involving native coronary artery of native heart without angina pectoris   3. H/O mitral valve replacement with mechanical valve   4. Pacemaker   5. Chronic atrial fibrillation (H[facility])   6. Essential hypertension      PLAN:  In order of problems listed above:  1. Chronic diastolic heart failure: She continued to have 2-3+ lower extremity pitting edema.  However her weight has significantly improved.  Initially I want to increase her diuretic, however during today's blood work, our phlebotomist had a hard time was drawing her blood.  I fear she may be a little dehydrated.  Surprisingly, she has not achieved her dry weight of 118.  Will wait for the result of basic metabolic panel before further decision.  For now will reduce Lasix to 80 mg daily and reduce potassium supplement to 20 mEq twice daily  2. CAD: Denies any chest pain.  Continue Lipitor, not on aspirin  given the need for Coumadin  3. Pacemaker: Followed by Dr. Sallyanne Kuster  4. Mechanical mitral valve replacement: On Coumadin  5. Chronic atrial fibrillation: Rate controlled, continue Coumadin  6. Hypertension: Blood pressure well controlled    Medication Adjustments/Labs and Tests Ordered: Current medicines are reviewed at length with the patient today.  Concerns regarding medicines are outlined above.  Medication changes, Labs and Tests ordered today are listed in the Patient Instructions below. Patient Instructions  Medication Instructions:  Reduce Lasix to 80mg  daily. Reduce Potassium to 67meq twice daily Await further instruction based on labwork  result   Labwork: Your physician recommends that you return for lab work in: [place] 1 [place] BMET  Testing/Procedures: NONE   Follow-Up: FOLLOW UP AS SCHEDULED  Any Other Special Instructions Will Be Listed Below (If Applicable). If you need a refill on your cardiac medications before your next appointment, please call your pharmacy.     Hilbert Corrigan, [place]  04/29/2018 11:55 PM    [facility] [address], [place], [place]  [address] Phone: [phone]; Fax: [phone]

## 2018-04-27 NOTE — Patient Instructions (Addendum)
Medication Instructions:  Reduce Lasix to 80mg  daily. Reduce Potassium to 92meq twice daily Await further instruction based on labwork result   Labwork: Your physician recommends that you return for lab work in: [place] [facility] [place] [facility]  Testing/Procedures: NONE   Follow-Up: FOLLOW UP AS SCHEDULED  Any Other Special Instructions Will Be Listed Below (If Applicable). If you need a refill on your cardiac medications before your next appointment, please call your pharmacy.

## 2018-04-28 ENCOUNTER — Telehealth: Payer: Self-pay | Admitting: Cardiovascular Disease

## 2018-04-28 DIAGNOSIS — K219 Gastro-esophageal reflux disease without esophagitis: Secondary | ICD-10-CM | POA: Diagnosis not present

## 2018-04-28 DIAGNOSIS — Z7901 Long term (current) use of anticoagulants: Secondary | ICD-10-CM | POA: Diagnosis not present

## 2018-04-28 DIAGNOSIS — R2689 Other abnormalities of gait and mobility: Secondary | ICD-10-CM

## 2018-04-28 DIAGNOSIS — I2510 Atherosclerotic heart disease of native coronary artery without angina pectoris: Secondary | ICD-10-CM | POA: Diagnosis not present

## 2018-04-28 DIAGNOSIS — I4510 Unspecified right bundle-branch block: Secondary | ICD-10-CM | POA: Diagnosis not present

## 2018-04-28 DIAGNOSIS — Z952 Presence of prosthetic heart valve: Secondary | ICD-10-CM | POA: Diagnosis not present

## 2018-04-28 DIAGNOSIS — M19011 Primary osteoarthritis, right shoulder: Secondary | ICD-10-CM | POA: Diagnosis not present

## 2018-04-28 DIAGNOSIS — I5032 Chronic diastolic (congestive) heart failure: Secondary | ICD-10-CM

## 2018-04-28 DIAGNOSIS — I252 Old myocardial infarction: Secondary | ICD-10-CM | POA: Diagnosis not present

## 2018-04-28 DIAGNOSIS — I5042 Chronic combined systolic (congestive) and diastolic (congestive) heart failure: Secondary | ICD-10-CM | POA: Diagnosis not present

## 2018-04-28 DIAGNOSIS — I130 Hypertensive heart and chronic kidney disease with heart failure and stage 1 through stage 4 chronic kidney disease, or unspecified chronic kidney disease: Secondary | ICD-10-CM | POA: Diagnosis not present

## 2018-04-28 DIAGNOSIS — R042 Hemoptysis: Secondary | ICD-10-CM | POA: Diagnosis not present

## 2018-04-28 DIAGNOSIS — Z950 Presence of cardiac pacemaker: Secondary | ICD-10-CM | POA: Diagnosis not present

## 2018-04-28 DIAGNOSIS — I481 Persistent atrial fibrillation: Secondary | ICD-10-CM | POA: Diagnosis not present

## 2018-04-28 DIAGNOSIS — N183 Chronic kidney disease, stage 3 (moderate): Secondary | ICD-10-CM | POA: Diagnosis not present

## 2018-04-28 DIAGNOSIS — R5381 Other malaise: Secondary | ICD-10-CM

## 2018-04-28 DIAGNOSIS — I082 Rheumatic disorders of both aortic and tricuspid valves: Secondary | ICD-10-CM | POA: Diagnosis not present

## 2018-04-28 DIAGNOSIS — I4892 Unspecified atrial flutter: Secondary | ICD-10-CM | POA: Diagnosis not present

## 2018-04-28 DIAGNOSIS — I272 Pulmonary hypertension, unspecified: Secondary | ICD-10-CM | POA: Diagnosis not present

## 2018-04-28 DIAGNOSIS — I495 Sick sinus syndrome: Secondary | ICD-10-CM | POA: Diagnosis not present

## 2018-04-28 DIAGNOSIS — Z8679 Personal history of other diseases of the circulatory system: Secondary | ICD-10-CM | POA: Diagnosis not present

## 2018-04-28 DIAGNOSIS — E785 Hyperlipidemia, unspecified: Secondary | ICD-10-CM | POA: Diagnosis not present

## 2018-04-28 LAB — BASIC METABOLIC PANEL
BUN / CREAT RATIO: 25 (ref 12–28)
BUN: 30 mg/dL — AB (ref 8–27)
CO2: 13 mmol/L — ABNORMAL LOW (ref 20–29)
CREATININE: 1.21 mg/dL — AB (ref 0.57–1.00)
Calcium: 10.1 mg/dL (ref 8.7–10.3)
Chloride: 107 mmol/L — ABNORMAL HIGH (ref 96–106)
GFR calc Af Amer: 50 mL/min/{1.73_m2} — ABNORMAL LOW (ref >59)
GFR calc non Af Amer: 44 mL/min/{1.73_m2} — ABNORMAL LOW (ref >59)
Glucose: 92 mg/dL (ref 65–99)
Potassium: 5.1 mmol/L (ref 3.5–5.2)
SODIUM: 143 mmol/L (ref 134–144)

## 2018-04-28 NOTE — Progress Notes (Signed)
Kidney function only slightly down, I decreased her diuretic to 80mg  daily and potassium to 20mg  BID during the last visit. Recheck BMET in 1 week. Keep diary on her weight. Call cardiology if weight increase by 3 lbs overnight or 5 lbs in a single week

## 2018-04-28 NOTE — Telephone Encounter (Signed)
Physical therapy ok per Carrie [name]; left Claiborne Billings a messge advising her to send of a form for [place] to sign if needed. Order placed.

## 2018-04-28 NOTE — Telephone Encounter (Signed)
That is reasonable. Thank you.

## 2018-04-28 NOTE — Telephone Encounter (Signed)
Spoke with Claiborne Billings from [facility], who is requesting orders for Physical Therapy (2 times a week for 2 weeks and 1 time a week for 1 week).  Routed to [place], [place]

## 2018-04-28 NOTE — Telephone Encounter (Signed)
New Message         Claiborne Billings with [facility] is needing  Home orders for phyiscial thearpy. 1 to 2 a week for 3 weeks.

## 2018-05-02 NOTE — Telephone Encounter (Signed)
Left message for [place] agreeable with PT and to contact office with any questions or concerns.

## 2018-05-04 DIAGNOSIS — Z7901 Long term (current) use of anticoagulants: Secondary | ICD-10-CM | POA: Diagnosis not present

## 2018-05-04 DIAGNOSIS — K219 Gastro-esophageal reflux disease without esophagitis: Secondary | ICD-10-CM | POA: Diagnosis not present

## 2018-05-04 DIAGNOSIS — I082 Rheumatic disorders of both aortic and tricuspid valves: Secondary | ICD-10-CM | POA: Diagnosis not present

## 2018-05-04 DIAGNOSIS — I4510 Unspecified right bundle-branch block: Secondary | ICD-10-CM | POA: Diagnosis not present

## 2018-05-04 DIAGNOSIS — Z950 Presence of cardiac pacemaker: Secondary | ICD-10-CM | POA: Diagnosis not present

## 2018-05-04 DIAGNOSIS — I272 Pulmonary hypertension, unspecified: Secondary | ICD-10-CM | POA: Diagnosis not present

## 2018-05-04 DIAGNOSIS — Z952 Presence of prosthetic heart valve: Secondary | ICD-10-CM | POA: Diagnosis not present

## 2018-05-04 DIAGNOSIS — I495 Sick sinus syndrome: Secondary | ICD-10-CM | POA: Diagnosis not present

## 2018-05-04 DIAGNOSIS — I130 Hypertensive heart and chronic kidney disease with heart failure and stage 1 through stage 4 chronic kidney disease, or unspecified chronic kidney disease: Secondary | ICD-10-CM | POA: Diagnosis not present

## 2018-05-04 DIAGNOSIS — I4892 Unspecified atrial flutter: Secondary | ICD-10-CM | POA: Diagnosis not present

## 2018-05-04 DIAGNOSIS — N183 Chronic kidney disease, stage 3 (moderate): Secondary | ICD-10-CM | POA: Diagnosis not present

## 2018-05-04 DIAGNOSIS — Z8679 Personal history of other diseases of the circulatory system: Secondary | ICD-10-CM | POA: Diagnosis not present

## 2018-05-04 DIAGNOSIS — I2510 Atherosclerotic heart disease of native coronary artery without angina pectoris: Secondary | ICD-10-CM | POA: Diagnosis not present

## 2018-05-04 DIAGNOSIS — R042 Hemoptysis: Secondary | ICD-10-CM | POA: Diagnosis not present

## 2018-05-04 DIAGNOSIS — I481 Persistent atrial fibrillation: Secondary | ICD-10-CM | POA: Diagnosis not present

## 2018-05-04 DIAGNOSIS — I252 Old myocardial infarction: Secondary | ICD-10-CM | POA: Diagnosis not present

## 2018-05-04 DIAGNOSIS — M19011 Primary osteoarthritis, right shoulder: Secondary | ICD-10-CM | POA: Diagnosis not present

## 2018-05-04 DIAGNOSIS — I5042 Chronic combined systolic (congestive) and diastolic (congestive) heart failure: Secondary | ICD-10-CM | POA: Diagnosis not present

## 2018-05-04 DIAGNOSIS — E785 Hyperlipidemia, unspecified: Secondary | ICD-10-CM | POA: Diagnosis not present

## 2018-05-04 LAB — BASIC METABOLIC PANEL
BUN / CREAT RATIO: 27 (ref 12–28)
BUN: 31 mg/dL — AB (ref 8–27)
CHLORIDE: 105 mmol/L (ref 96–106)
CO2: 17 mmol/L — AB (ref 20–29)
CREATININE: 1.14 mg/dL — AB (ref 0.57–1.00)
Calcium: 9.4 mg/dL (ref 8.7–10.3)
GFR calc Af Amer: 54 mL/min/{1.73_m2} — ABNORMAL LOW (ref >59)
GFR calc non Af Amer: 47 mL/min/{1.73_m2} — ABNORMAL LOW (ref >59)
GLUCOSE: 103 mg/dL — AB (ref 65–99)
Potassium: 4 mmol/L (ref 3.5–5.2)
Sodium: 139 mmol/L (ref 134–144)

## 2018-05-05 DIAGNOSIS — K219 Gastro-esophageal reflux disease without esophagitis: Secondary | ICD-10-CM | POA: Diagnosis not present

## 2018-05-05 DIAGNOSIS — I252 Old myocardial infarction: Secondary | ICD-10-CM | POA: Diagnosis not present

## 2018-05-05 DIAGNOSIS — I5042 Chronic combined systolic (congestive) and diastolic (congestive) heart failure: Secondary | ICD-10-CM | POA: Diagnosis not present

## 2018-05-05 DIAGNOSIS — I272 Pulmonary hypertension, unspecified: Secondary | ICD-10-CM | POA: Diagnosis not present

## 2018-05-05 DIAGNOSIS — I2510 Atherosclerotic heart disease of native coronary artery without angina pectoris: Secondary | ICD-10-CM | POA: Diagnosis not present

## 2018-05-05 DIAGNOSIS — R042 Hemoptysis: Secondary | ICD-10-CM | POA: Diagnosis not present

## 2018-05-05 DIAGNOSIS — E785 Hyperlipidemia, unspecified: Secondary | ICD-10-CM | POA: Diagnosis not present

## 2018-05-05 DIAGNOSIS — I082 Rheumatic disorders of both aortic and tricuspid valves: Secondary | ICD-10-CM | POA: Diagnosis not present

## 2018-05-05 DIAGNOSIS — Z7901 Long term (current) use of anticoagulants: Secondary | ICD-10-CM | POA: Diagnosis not present

## 2018-05-05 DIAGNOSIS — I130 Hypertensive heart and chronic kidney disease with heart failure and stage 1 through stage 4 chronic kidney disease, or unspecified chronic kidney disease: Secondary | ICD-10-CM | POA: Diagnosis not present

## 2018-05-05 DIAGNOSIS — I4892 Unspecified atrial flutter: Secondary | ICD-10-CM | POA: Diagnosis not present

## 2018-05-05 DIAGNOSIS — Z8679 Personal history of other diseases of the circulatory system: Secondary | ICD-10-CM | POA: Diagnosis not present

## 2018-05-05 DIAGNOSIS — Z952 Presence of prosthetic heart valve: Secondary | ICD-10-CM | POA: Diagnosis not present

## 2018-05-05 DIAGNOSIS — I481 Persistent atrial fibrillation: Secondary | ICD-10-CM | POA: Diagnosis not present

## 2018-05-05 DIAGNOSIS — N183 Chronic kidney disease, stage 3 (moderate): Secondary | ICD-10-CM | POA: Diagnosis not present

## 2018-05-05 DIAGNOSIS — I495 Sick sinus syndrome: Secondary | ICD-10-CM | POA: Diagnosis not present

## 2018-05-05 DIAGNOSIS — Z950 Presence of cardiac pacemaker: Secondary | ICD-10-CM | POA: Diagnosis not present

## 2018-05-05 DIAGNOSIS — M19011 Primary osteoarthritis, right shoulder: Secondary | ICD-10-CM | POA: Diagnosis not present

## 2018-05-05 DIAGNOSIS — I4510 Unspecified right bundle-branch block: Secondary | ICD-10-CM | POA: Diagnosis not present

## 2018-05-06 DIAGNOSIS — I252 Old myocardial infarction: Secondary | ICD-10-CM | POA: Diagnosis not present

## 2018-05-06 DIAGNOSIS — Z8679 Personal history of other diseases of the circulatory system: Secondary | ICD-10-CM | POA: Diagnosis not present

## 2018-05-06 DIAGNOSIS — E785 Hyperlipidemia, unspecified: Secondary | ICD-10-CM | POA: Diagnosis not present

## 2018-05-06 DIAGNOSIS — I481 Persistent atrial fibrillation: Secondary | ICD-10-CM | POA: Diagnosis not present

## 2018-05-06 DIAGNOSIS — N183 Chronic kidney disease, stage 3 (moderate): Secondary | ICD-10-CM | POA: Diagnosis not present

## 2018-05-06 DIAGNOSIS — M19011 Primary osteoarthritis, right shoulder: Secondary | ICD-10-CM | POA: Diagnosis not present

## 2018-05-06 DIAGNOSIS — I082 Rheumatic disorders of both aortic and tricuspid valves: Secondary | ICD-10-CM | POA: Diagnosis not present

## 2018-05-06 DIAGNOSIS — I5042 Chronic combined systolic (congestive) and diastolic (congestive) heart failure: Secondary | ICD-10-CM | POA: Diagnosis not present

## 2018-05-06 DIAGNOSIS — I272 Pulmonary hypertension, unspecified: Secondary | ICD-10-CM | POA: Diagnosis not present

## 2018-05-06 DIAGNOSIS — I4892 Unspecified atrial flutter: Secondary | ICD-10-CM | POA: Diagnosis not present

## 2018-05-06 DIAGNOSIS — I4510 Unspecified right bundle-branch block: Secondary | ICD-10-CM | POA: Diagnosis not present

## 2018-05-06 DIAGNOSIS — Z950 Presence of cardiac pacemaker: Secondary | ICD-10-CM | POA: Diagnosis not present

## 2018-05-06 DIAGNOSIS — I495 Sick sinus syndrome: Secondary | ICD-10-CM | POA: Diagnosis not present

## 2018-05-06 DIAGNOSIS — I130 Hypertensive heart and chronic kidney disease with heart failure and stage 1 through stage 4 chronic kidney disease, or unspecified chronic kidney disease: Secondary | ICD-10-CM | POA: Diagnosis not present

## 2018-05-06 DIAGNOSIS — Z952 Presence of prosthetic heart valve: Secondary | ICD-10-CM | POA: Diagnosis not present

## 2018-05-06 DIAGNOSIS — I2510 Atherosclerotic heart disease of native coronary artery without angina pectoris: Secondary | ICD-10-CM | POA: Diagnosis not present

## 2018-05-06 DIAGNOSIS — Z7901 Long term (current) use of anticoagulants: Secondary | ICD-10-CM | POA: Diagnosis not present

## 2018-05-06 DIAGNOSIS — K219 Gastro-esophageal reflux disease without esophagitis: Secondary | ICD-10-CM | POA: Diagnosis not present

## 2018-05-06 DIAGNOSIS — R042 Hemoptysis: Secondary | ICD-10-CM | POA: Diagnosis not present

## 2018-05-09 DIAGNOSIS — I10 Essential (primary) hypertension: Secondary | ICD-10-CM | POA: Diagnosis not present

## 2018-05-11 DIAGNOSIS — I130 Hypertensive heart and chronic kidney disease with heart failure and stage 1 through stage 4 chronic kidney disease, or unspecified chronic kidney disease: Secondary | ICD-10-CM | POA: Diagnosis not present

## 2018-05-11 DIAGNOSIS — Z950 Presence of cardiac pacemaker: Secondary | ICD-10-CM | POA: Diagnosis not present

## 2018-05-11 DIAGNOSIS — I2510 Atherosclerotic heart disease of native coronary artery without angina pectoris: Secondary | ICD-10-CM | POA: Diagnosis not present

## 2018-05-11 DIAGNOSIS — I272 Pulmonary hypertension, unspecified: Secondary | ICD-10-CM | POA: Diagnosis not present

## 2018-05-11 DIAGNOSIS — E785 Hyperlipidemia, unspecified: Secondary | ICD-10-CM | POA: Diagnosis not present

## 2018-05-11 DIAGNOSIS — K219 Gastro-esophageal reflux disease without esophagitis: Secondary | ICD-10-CM | POA: Diagnosis not present

## 2018-05-11 DIAGNOSIS — N183 Chronic kidney disease, stage 3 (moderate): Secondary | ICD-10-CM | POA: Diagnosis not present

## 2018-05-11 DIAGNOSIS — I481 Persistent atrial fibrillation: Secondary | ICD-10-CM | POA: Diagnosis not present

## 2018-05-11 DIAGNOSIS — Z7901 Long term (current) use of anticoagulants: Secondary | ICD-10-CM | POA: Diagnosis not present

## 2018-05-11 DIAGNOSIS — M19011 Primary osteoarthritis, right shoulder: Secondary | ICD-10-CM | POA: Diagnosis not present

## 2018-05-11 DIAGNOSIS — R042 Hemoptysis: Secondary | ICD-10-CM | POA: Diagnosis not present

## 2018-05-11 DIAGNOSIS — I5042 Chronic combined systolic (congestive) and diastolic (congestive) heart failure: Secondary | ICD-10-CM | POA: Diagnosis not present

## 2018-05-11 DIAGNOSIS — I495 Sick sinus syndrome: Secondary | ICD-10-CM | POA: Diagnosis not present

## 2018-05-11 DIAGNOSIS — I082 Rheumatic disorders of both aortic and tricuspid valves: Secondary | ICD-10-CM | POA: Diagnosis not present

## 2018-05-11 DIAGNOSIS — I4892 Unspecified atrial flutter: Secondary | ICD-10-CM | POA: Diagnosis not present

## 2018-05-11 DIAGNOSIS — I252 Old myocardial infarction: Secondary | ICD-10-CM | POA: Diagnosis not present

## 2018-05-11 DIAGNOSIS — Z952 Presence of prosthetic heart valve: Secondary | ICD-10-CM | POA: Diagnosis not present

## 2018-05-11 DIAGNOSIS — Z8679 Personal history of other diseases of the circulatory system: Secondary | ICD-10-CM | POA: Diagnosis not present

## 2018-05-11 DIAGNOSIS — I4510 Unspecified right bundle-branch block: Secondary | ICD-10-CM | POA: Diagnosis not present

## 2018-05-13 DIAGNOSIS — Z950 Presence of cardiac pacemaker: Secondary | ICD-10-CM | POA: Diagnosis not present

## 2018-05-13 DIAGNOSIS — Z8679 Personal history of other diseases of the circulatory system: Secondary | ICD-10-CM | POA: Diagnosis not present

## 2018-05-13 DIAGNOSIS — I082 Rheumatic disorders of both aortic and tricuspid valves: Secondary | ICD-10-CM | POA: Diagnosis not present

## 2018-05-13 DIAGNOSIS — I4510 Unspecified right bundle-branch block: Secondary | ICD-10-CM | POA: Diagnosis not present

## 2018-05-13 DIAGNOSIS — Z952 Presence of prosthetic heart valve: Secondary | ICD-10-CM | POA: Diagnosis not present

## 2018-05-13 DIAGNOSIS — I2510 Atherosclerotic heart disease of native coronary artery without angina pectoris: Secondary | ICD-10-CM | POA: Diagnosis not present

## 2018-05-13 DIAGNOSIS — N183 Chronic kidney disease, stage 3 (moderate): Secondary | ICD-10-CM | POA: Diagnosis not present

## 2018-05-13 DIAGNOSIS — Z7901 Long term (current) use of anticoagulants: Secondary | ICD-10-CM | POA: Diagnosis not present

## 2018-05-13 DIAGNOSIS — R042 Hemoptysis: Secondary | ICD-10-CM | POA: Diagnosis not present

## 2018-05-13 DIAGNOSIS — M19011 Primary osteoarthritis, right shoulder: Secondary | ICD-10-CM | POA: Diagnosis not present

## 2018-05-13 DIAGNOSIS — I4892 Unspecified atrial flutter: Secondary | ICD-10-CM | POA: Diagnosis not present

## 2018-05-13 DIAGNOSIS — I130 Hypertensive heart and chronic kidney disease with heart failure and stage 1 through stage 4 chronic kidney disease, or unspecified chronic kidney disease: Secondary | ICD-10-CM | POA: Diagnosis not present

## 2018-05-13 DIAGNOSIS — I252 Old myocardial infarction: Secondary | ICD-10-CM | POA: Diagnosis not present

## 2018-05-13 DIAGNOSIS — I272 Pulmonary hypertension, unspecified: Secondary | ICD-10-CM | POA: Diagnosis not present

## 2018-05-13 DIAGNOSIS — E785 Hyperlipidemia, unspecified: Secondary | ICD-10-CM | POA: Diagnosis not present

## 2018-05-13 DIAGNOSIS — I495 Sick sinus syndrome: Secondary | ICD-10-CM | POA: Diagnosis not present

## 2018-05-13 DIAGNOSIS — I5042 Chronic combined systolic (congestive) and diastolic (congestive) heart failure: Secondary | ICD-10-CM | POA: Diagnosis not present

## 2018-05-13 DIAGNOSIS — K219 Gastro-esophageal reflux disease without esophagitis: Secondary | ICD-10-CM | POA: Diagnosis not present

## 2018-05-13 DIAGNOSIS — I481 Persistent atrial fibrillation: Secondary | ICD-10-CM | POA: Diagnosis not present

## 2018-05-16 DIAGNOSIS — I238 Other current complications following acute myocardial infarction: Secondary | ICD-10-CM | POA: Diagnosis not present

## 2018-05-16 DIAGNOSIS — I110 Hypertensive heart disease with heart failure: Secondary | ICD-10-CM | POA: Diagnosis not present

## 2018-05-16 DIAGNOSIS — I69311 Memory deficit following cerebral infarction: Secondary | ICD-10-CM | POA: Diagnosis not present

## 2018-05-18 ENCOUNTER — Encounter: Payer: Medicare Other | Admitting: Cardiovascular Disease

## 2018-05-18 ENCOUNTER — Ambulatory Visit (INDEPENDENT_AMBULATORY_CARE_PROVIDER_SITE_OTHER): Payer: Medicare Other | Admitting: Pharmacist Clinician (PhC)/ Clinical Pharmacy Specialist

## 2018-05-18 DIAGNOSIS — Z7901 Long term (current) use of anticoagulants: Secondary | ICD-10-CM

## 2018-05-18 DIAGNOSIS — Z952 Presence of prosthetic heart valve: Secondary | ICD-10-CM

## 2018-05-18 LAB — POCT INR: INR: 2.3 (ref 2.0–3.0)

## 2018-05-19 DIAGNOSIS — Z7901 Long term (current) use of anticoagulants: Secondary | ICD-10-CM | POA: Diagnosis not present

## 2018-05-19 DIAGNOSIS — Z950 Presence of cardiac pacemaker: Secondary | ICD-10-CM | POA: Diagnosis not present

## 2018-05-19 DIAGNOSIS — N183 Chronic kidney disease, stage 3 (moderate): Secondary | ICD-10-CM | POA: Diagnosis not present

## 2018-05-19 DIAGNOSIS — I481 Persistent atrial fibrillation: Secondary | ICD-10-CM | POA: Diagnosis not present

## 2018-05-19 DIAGNOSIS — I4892 Unspecified atrial flutter: Secondary | ICD-10-CM | POA: Diagnosis not present

## 2018-05-19 DIAGNOSIS — E785 Hyperlipidemia, unspecified: Secondary | ICD-10-CM | POA: Diagnosis not present

## 2018-05-19 DIAGNOSIS — Z952 Presence of prosthetic heart valve: Secondary | ICD-10-CM | POA: Diagnosis not present

## 2018-05-19 DIAGNOSIS — I130 Hypertensive heart and chronic kidney disease with heart failure and stage 1 through stage 4 chronic kidney disease, or unspecified chronic kidney disease: Secondary | ICD-10-CM | POA: Diagnosis not present

## 2018-05-19 DIAGNOSIS — M19011 Primary osteoarthritis, right shoulder: Secondary | ICD-10-CM | POA: Diagnosis not present

## 2018-05-19 DIAGNOSIS — I4510 Unspecified right bundle-branch block: Secondary | ICD-10-CM | POA: Diagnosis not present

## 2018-05-19 DIAGNOSIS — Z8679 Personal history of other diseases of the circulatory system: Secondary | ICD-10-CM | POA: Diagnosis not present

## 2018-05-19 DIAGNOSIS — I2510 Atherosclerotic heart disease of native coronary artery without angina pectoris: Secondary | ICD-10-CM | POA: Diagnosis not present

## 2018-05-19 DIAGNOSIS — I252 Old myocardial infarction: Secondary | ICD-10-CM | POA: Diagnosis not present

## 2018-05-19 DIAGNOSIS — I495 Sick sinus syndrome: Secondary | ICD-10-CM | POA: Diagnosis not present

## 2018-05-19 DIAGNOSIS — R042 Hemoptysis: Secondary | ICD-10-CM | POA: Diagnosis not present

## 2018-05-19 DIAGNOSIS — I272 Pulmonary hypertension, unspecified: Secondary | ICD-10-CM | POA: Diagnosis not present

## 2018-05-19 DIAGNOSIS — K219 Gastro-esophageal reflux disease without esophagitis: Secondary | ICD-10-CM | POA: Diagnosis not present

## 2018-05-19 DIAGNOSIS — I082 Rheumatic disorders of both aortic and tricuspid valves: Secondary | ICD-10-CM | POA: Diagnosis not present

## 2018-05-19 DIAGNOSIS — I5042 Chronic combined systolic (congestive) and diastolic (congestive) heart failure: Secondary | ICD-10-CM | POA: Diagnosis not present

## 2018-05-26 ENCOUNTER — Encounter: Payer: Medicare Other | Admitting: *Deleted

## 2018-05-26 ENCOUNTER — Telehealth: Payer: Self-pay | Admitting: Cardiology

## 2018-05-26 NOTE — Telephone Encounter (Signed)
LMOVM reminding pt to send remote transmission.   

## 2018-05-27 ENCOUNTER — Encounter: Payer: Self-pay | Admitting: Cardiology

## 2018-06-01 ENCOUNTER — Ambulatory Visit: Payer: Medicare Other | Admitting: Cardiovascular Disease

## 2018-06-01 ENCOUNTER — Encounter: Payer: Self-pay | Admitting: Cardiovascular Disease

## 2018-06-01 ENCOUNTER — Ambulatory Visit (INDEPENDENT_AMBULATORY_CARE_PROVIDER_SITE_OTHER): Payer: Medicare Other | Admitting: Pharmacist

## 2018-06-01 ENCOUNTER — Ambulatory Visit (INDEPENDENT_AMBULATORY_CARE_PROVIDER_SITE_OTHER): Payer: Medicare Other | Admitting: *Deleted

## 2018-06-01 VITALS — BP 118/58 | HR 58 | Ht 61.0 in | Wt 133.8 lb

## 2018-06-01 DIAGNOSIS — Z7901 Long term (current) use of anticoagulants: Secondary | ICD-10-CM | POA: Diagnosis not present

## 2018-06-01 DIAGNOSIS — I5033 Acute on chronic diastolic (congestive) heart failure: Secondary | ICD-10-CM | POA: Diagnosis not present

## 2018-06-01 DIAGNOSIS — I10 Essential (primary) hypertension: Secondary | ICD-10-CM

## 2018-06-01 DIAGNOSIS — Z952 Presence of prosthetic heart valve: Secondary | ICD-10-CM

## 2018-06-01 DIAGNOSIS — I481 Persistent atrial fibrillation: Secondary | ICD-10-CM | POA: Diagnosis not present

## 2018-06-01 DIAGNOSIS — I2721 Secondary pulmonary arterial hypertension: Secondary | ICD-10-CM

## 2018-06-01 DIAGNOSIS — I495 Sick sinus syndrome: Secondary | ICD-10-CM

## 2018-06-01 DIAGNOSIS — I071 Rheumatic tricuspid insufficiency: Secondary | ICD-10-CM

## 2018-06-01 DIAGNOSIS — I2510 Atherosclerotic heart disease of native coronary artery without angina pectoris: Secondary | ICD-10-CM | POA: Diagnosis not present

## 2018-06-01 DIAGNOSIS — Z950 Presence of cardiac pacemaker: Secondary | ICD-10-CM

## 2018-06-01 DIAGNOSIS — I4811 Longstanding persistent atrial fibrillation: Secondary | ICD-10-CM

## 2018-06-01 LAB — POCT INR: INR: 2.3 (ref 2.0–3.0)

## 2018-06-01 MED ORDER — POTASSIUM CHLORIDE CRYS ER 20 MEQ PO TBCR: EXTENDED_RELEASE_TABLET | ORAL | 3 refills | Status: DC

## 2018-06-01 MED ORDER — FUROSEMIDE 80 MG PO TABS: 80.0000 mg | ORAL_TABLET | Freq: Two times a day (BID) | ORAL | 3 refills | Status: DC

## 2018-06-01 NOTE — Progress Notes (Signed)
Patient ID: Carrie [name], female   DOB: [birth year], 77 y.o.   MRN: [id number] Patient ID: Carrie [name], female   DOB: Jul 11, 1941, 77 y.o.   MRN: [id number]    Cardiology Office Note    Date:  06/03/2018   ID:  Carrie [name], DOB 1941-02-21, MRN [id number]  PCP:  Lucianne Lei, MD  Cardiologist:   [name], MD   Chief Complaint  Patient presents with  . Congestive Heart Failure  . Atrial Fibrillation  . Pacemaker Check  . Coronary Artery Disease    History of Present Illness:  Carrie [name] is a 77 y.o. 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   She has not had any chest pain since her last appointment, but continues to be troubled by substantial lower extremity edema (despite compression stockings), exertional dyspnea likely functional class III and possible orthopnea.  Weight yesterday was 131 pounds (she did not weigh today).  Our office scale shortness 133 pounds and 13 ounces.  She has not had any falls or bleeding problems.  He denies palpitations, syncope or dizziness.  Her blood pressure has not been low (she checks it once a day).  Today she is in atrial fibrillation with controlled ventricular rate.  Her pacemaker interrogation shows that she only has 70% ventricular pacing and her immediate heart rate is in the 70s, with very rare episodes of rapid ventricular response. Battery longevity is estimated at 6.5 years.  The lead parameters remain very good.     Surprisingly, at her 04/21/2017 appointment she was in sinus rhythm, which had not been documented in a long time, but 05/11/2017 her rhythm was clearly irregular again and at all subsequent visits she has been in atrial fibrillation with intermittent ventricular pacing.  She has a single-chamber Medtronic [place] single chamber pacemaker (implant 2008, generator change 2015) set at lower rate limit of 50 bpm, to try to limit the frequency of ventricular pacing, since this seems to worsen heart failure.  In December 2012 she had subarachnoid hemorrhage and in June 2013 she had intrahepatic hemorrhage requiring arterial embolization and we are keeping her INR in the lower range (2.0-2.5). She has normal coronary arteries by previous angiography (2004) normal left ventricular systolic function by echocardiography (Oct 2015).  She has severe tricuspid insufficiency.  Last nuclear stress test 04/29/2017:   The left ventricular ejection fraction is hyperdynamic (>65%).  Nuclear stress EF: 67%.  There was no ST segment deviation noted during stress.  The study is normal.  This is a low risk study.   Normal pharmacologic nuclear stress test with no evidence for prior infarct or ischemia.  Last echo 10/01/2016:  Study Conclusions  - Left ventricle: Systolic function was normal. The estimated ejection fraction was in the range of 50% to 55%. Wall motion was   normal; there were no regional wall motion abnormalities. Mitral valve prosthesis and atrial fibrillation prevents evaluation of   LV diastolic function. - Ventricular septum: Septal motion showed paradox. The contour showed diastolic flattening. These changes are consistent with RV volume overload. - Aortic valve: Right  coronary cusp mobility was moderately restricted. There was trivial regurgitation. - Mitral valve: A mechanical prosthesis was present and functioning normally. - Left atrium: The atrium was severely dilated. - Right  ventricle: The cavity size was moderately dilated. Systolic function was mildly to moderately reduced. - Right atrium: The atrium was severely dilated. - Atrial septum: No defect or patent foramen ovale was identified. - Tricuspid valve: There was severe regurgitation. - Pulmonary arteries: Systolic pressure was moderately increased. PA peak pressure: 55 mm Hg (S).  Past Medical History:  Diagnosis Date  . Anticoagulated on Coumadin    for mech valve and atrial fib  goal 2.0-2.5  . Anxiety   . Arthritis    "right shoulder" (03/15/2013)  . Atrial fibrillation ([place])   . Atrial flutter ([place])   . CAD (coronary artery disease) 08/11/2016  . CHF (congestive heart failure) ([place])   . Chronic combined systolic and diastolic CHF (congestive heart failure) ([place])    a. 02/2016 Echo: EF 45-50%.  . Diverticula of colon   . Exertional shortness of breath   . GERD (gastroesophageal reflux disease)   . Heart murmur   . Hemorrhage intraabdominal 03/17/2012  . History of blood transfusion    "once" (03/15/2013)  . Hyperlipidemia   . Hypertension   . Liver hemorrhage   . Migraines   . 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 Bi-leaflet St. Jude mechanical prosthesis; b. 02/2016 Echo: EF 45-50%, some degree of MR, sev dil LA/RA, sev TR.  . NSTEMI (non-ST elevated myocardial infarction) ([place])    a. 02/2016 elev trop/Cath: nonobs dzs,   . Pacemaker    medtronic adapta  . Pneumonia 2009   resolved.? OPD Rx  . Severe tricuspid regurgitation    a. 02/2016 Echo: Ef 45-50%, sev TR, PASP 50mmHg.  . Sick sinus syndrome ([place])    Dr Cristopher Peru. EP study negative. Pacemaker 12/15/06 Medtronic  . Stroke [facility])    "they say I had a stroke last year" denies residual on 03/15/2013  . Subdural hematoma [facility])     Past Surgical History:  Procedure Laterality Date  . APPENDECTOMY    . CARDIAC CATHETERIZATION  04/02/97   R&L:severe MR/pulmonary hypertension  . CARDIAC CATHETERIZATION N/A 03/16/2016    Procedure: Left Heart Cath and Coronary Angiography;  Surgeon: [name], MD;  Location: [facility];  Service: Cardiovascular;  Laterality: N/A;  . CARDIAC CATHETERIZATION N/A 09/30/2016   Procedure: Left Heart Cath and Coronary Angiography;  Surgeon: [name], MD;  Location: [place];  Service: Cardiovascular;  Laterality: N/A;  . CARDIAC CATHETERIZATION N/A 09/30/2016   Procedure: Coronary Stent Intervention;  Surgeon: [name], MD;  3.5 x 12 mm resolute Onyx  to ostial RCA  . CATARACT EXTRACTION W/ INTRAOCULAR LENS  IMPLANT, BILATERAL Bilateral 2013  . CORONARY ANGIOPLASTY    . DILATION AND CURETTAGE OF UTERUS     "had fibroids" (03/15/2013)  . EXPLORATORY LAPAROTOMY     "had a growth on my intestines" (03/15/2013)  . HAMMER TOE SURGERY Right   . INSERT / REPLACE / REMOVE PACEMAKER  12/15/2006   Medtronic  . MITRAL VALVE REPLACEMENT  1998   St Jude mechanical; Dr. [name]  . PACEMAKER PLACEMENT  12/15/06   medtronic adapta for SSS  . PERMANENT PACEMAKER GENERATOR CHANGE N/A 07/24/2014   Procedure: PERMANENT PACEMAKER GENERATOR CHANGE;  Surgeon: [name], MD;  Location: [facility];  Service: Cardiovascular;  Laterality: N/A;  .  PERSANTINE CARDIOLITE  08/07/03   mild inf. ischemia   . TEE WITHOUT CARDIOVERSION  09/23/2011   Procedure: TRANSESOPHAGEAL ECHOCARDIOGRAM (TEE);  Surgeon: [name];  Location: [facility];  Service: Cardiovascular;  Laterality: N/A;  . TONSILLECTOMY    . TUBAL LIGATION    . US ECHOCARDIOGRAPHY  11/19/2011   EF 50-55%,RA mod to severely dilated,LA severely dilated,trace MR,small vegetation or mass on the MV,AOV mildly scleroticmild PI, RV pressure 40-42mmHg    Outpatient Medications Prior to Visit  Medication Sig Dispense Refill  . atorvastatin (LIPITOR) 40 MG tablet Take 1 tablet (40 mg total) by mouth daily at 6 PM. 30 tablet 12  . Biotin 1000 MCG tablet Take 1,000 mcg by mouth daily.    Marland Kitchen CARTIA XT 120  MG 24 hr capsule TAKE ONE CAPSULE BY MOUTH DAILY 90 capsule 2  . dextromethorphan-guaiFENesin (MUCINEX DM) 30-600 MG 12hr tablet Take 1 tablet by mouth 2 (two) times daily as needed for cough. 20 tablet 0  . docusate sodium (COLACE) 100 MG capsule Take 200 mg by mouth 2 (two) times daily.     . fluticasone (FLONASE) 50 MCG/ACT nasal spray SHAKE LQ AND U 1 SPR IEN BID  1  . metoprolol tartrate (LOPRESSOR) 25 MG tablet Take 1 tablet (25 mg total) by mouth 2 (two) times daily. 180 tablet 3  . Multiple Vitamin (MULTIVITAMINS PO) Take 1 tablet by mouth daily.     . nitroGLYCERIN (NITROSTAT) 0.4 MG SL tablet Place 1 tablet (0.4 mg total) under the tongue every 5 (five) minutes as needed for chest pain. 225 tablet 0  . Omega-3 Fatty Acids (FISH OIL) 1000 MG CAPS Take 1,000 mg by mouth daily.    . polyethylene glycol (MIRALAX / GLYCOLAX) packet Take 17 g by mouth as needed (for constipation). Reported on 03/25/2016    . warfarin (COUMADIN) 5 MG tablet TAKE 1 TO 1 AND 1/2 TABLETS BY MOUTH DAILY AS DIRECTED (Patient taking differently: TAKE 5MG  DAILY EXCEPT ON MONDAYS TAKE 7.5MG .) 120 tablet 1  . furosemide (LASIX) 40 MG tablet Take 120mg  in the morning and 80mg  in the evening. 150 tablet 0  . furosemide (LASIX) 80 MG tablet Take 80 mg by mouth daily.   3  . potassium chloride SA (K-DUR,KLOR-CON) 20 MEQ tablet Take 2 tablets (40 mEq total) by mouth 2 (two) times daily. 120 tablet 0   No facility-administered medications prior to visit.      Allergies:   Codeine   Social History   Socioeconomic History  . Marital status: Married    Spouse name: Not on file  . Number of children: Not on file  . Years of education: Not on file  . Highest education level: Not on file  Occupational History  . Not on file  Social Needs  . Financial resource strain: Not on file  . Food insecurity:    Worry: Not on file    Inability: Not on file  . Transportation needs:    Medical: Not on file    Non-medical: Not on  file  Tobacco Use  . Smoking status: Never Smoker  . Smokeless tobacco: Never Used  Substance and Sexual Activity  . Alcohol use: No  . Drug use: No  . Sexual activity: Never  Lifestyle  . Physical activity:    Days per week: Not on file    Minutes per session: Not on file  . Stress: Not on file  Relationships  . Social connections:  Talks on phone: Not on file    Gets together: Not on file    Attends religious service: Not on file    Active member of club or organization: Not on file    Attends meetings of clubs or organizations: Not on file    Relationship status: Not on file  Other Topics Concern  . Not on file  Social History Narrative  . Not on file     Family History:  The patient's family history includes Cancer in her mother and sister; Diabetes in her sister; Healthy in her brother, brother, brother, and sister; Heart attack in her maternal grandfather; Kidney disease in her daughter; Leukemia in her sister; Stroke in her brother and father.   ROS:   Please see the history of present illness.    ROS All other systems reviewed and are negative.   PHYSICAL EXAM:   VS:  BP (!) 118/58   Pulse (!) 58   Ht 5\' 1"  (1.549 m)   Wt 133 lb 12.8 oz (60.7 kg)   BMI 25.28 kg/m     General: Alert, oriented x3, no distress, appears comfortable was sitting up Head: no evidence of trauma, PERRL, EOMI, no exophtalmos or lid lag, no myxedema, no xanthelasma; normal ears, nose and oropharynx Neck: Average JVP about 6 cm above the sternal angle, with V waves all the way to the angle of the jaw; brisk carotid pulses without delay and no carotid bruits Chest: clear to auscultation, no signs of consolidation by percussion or palpation, normal fremitus, symmetrical and full respiratory excursions Cardiovascular: normal position and quality of the apical impulse, irregular rhythm, crisp prosthetic valve sounds, 3/6 harsh holosystolic murmur heard on both sides of the sternal border,  rubs or gallops Abdomen: no tenderness or distention, no masses by palpation, no arterial bruits, normal bowel sounds, no hepatosplenomegaly, but the liver does appear to be pulsatile Extremities: no clubbing, cyanosis; 2+ ankle and pretibial edema, slightly more prominent on the right side than the left; 2+ radial, ulnar and brachial pulses bilaterally; 2+ right femoral, posterior tibial and dorsalis pedis pulses; 2+ left femoral, posterior tibial and dorsalis pedis pulses; no subclavian or femoral bruits Neurological: grossly nonfocal Psych: Normal mood and affect  Wt Readings from Last 3 Encounters:  06/01/18 133 lb 12.8 oz (60.7 kg)  04/27/18 127 lb (57.6 kg)  04/21/18 133 lb (60.3 kg)      Studies/Labs Reviewed:   EKG:  EKG is ordered today.  Shows atrial fibrillation with several ventricular paced beats and an overall rate of 65 bpm.  During native AV conduction there is a nonspecific intraventricular conduction delay with a total care restoration of about 140 ms.  The QTC is 470 ms  Recent Labs: 03/30/2018: ALT 28; B Natriuretic Peptide 1,780.8; Hemoglobin 9.0; Platelets 212 05/04/2018: BUN 31; Creatinine, Ser 1.14; Potassium 4.0; Sodium 139   Lipid Panel     Component Value Date/Time   CHOL 87 03/31/2018 0112   TRIG 46 03/31/2018 0112   HDL 31 (L) 03/31/2018 0112   CHOLHDL 2.8 03/31/2018 0112   VLDL 9 03/31/2018 0112   LDLCALC 47 03/31/2018 0112     ASSESSMENT:    1. Acute on chronic diastolic (congestive) heart failure ([place])   2. Coronary artery disease involving native coronary artery of native heart without angina pectoris   3. PAH (pulmonary artery hypertension) ([place])   4. Longstanding persistent atrial fibrillation ([place])   5. Pacemaker   6. History of mitral valve  replacement with mechanical valve   7. Rheumatic tricuspid valve regurgitation   8. Essential hypertension   9. Long term current use of anticoagulant      PLAN:  In order of problems listed  above:  1. CHF: Seems more edematous in the past despite being roughly the same weight.  It is possible she is lost some true weight and will try to push the diuretics. NYHA functional class II-III.  Note the fact that she has moderate pulmonary artery hypertension with normal left ventricular end-diastolic pressure (EDP 13 at cath). She probably has primarily fixed pulmonary hypertension related to previous mitral valve disease. Clinical and echo findings are of right heart failure, usually not left heart failure.  We will increase her furosemide to 80 mg twice daily initiated for "dry weight" target of 125 pounds on her home scale.  Asked her to call us if she develops dizziness or hypotension.  Recheck labs next week. 2. CAD s/p NSTEMI and DES-RCA: More than a year since her event, no longer taking antiplatelet agents since she is taking warfarin and she has a history of very severe bleeding complications.  On statin. 3. PAH: This was present even when she had normal LVEDP. Normally functioning mitral valve prosthesis. She has fixed pulmonary hypertension related to previous mitral valve disease. No clear symptoms of obstructive sleep apnea. There is no history of pulmonary embolism or COPD.  It is therefore expected that she will have residual dyspnea even when perfectly euvolemic 4. AFib: Long-term persistent, probably permanent atrial fibrillation, surprisingly at her August 2018 appointment had normal sinus rhythm, but now back in atrial fibrillation. The change in rhythm doesn't seem to be associated with any particular change hemodynamically. Antiarrhythmics do not appear to be justified.  On her current medical regimen and with the current pacemaker settings with seem to have struck the right balance between avoiding high ventricular rate and avoiding excessive ventricular pacing.  No changes made to the medicines.  She definitely appears to do better clinically when she has less right ventricular  pacing. 5. PPM: Normal device function.   Remote download in 3 months, office visit in 6 months 6. Mechanical mitral valve replacement: She has a large diameter valve that had normal function by her echocardiogram.  It is unlikely that she will develop a major increase in transvalvular gradients even when her heart rate is a little faster. 7. Severe TR is the major cause of her edema and the jugular venous distention. She may have some degree of rheumatic valve disease of the tricuspid valve The most important cause is likely to be moderate pulmonary artery hypertension. The presence of a transvalvular pacemaker lead, mechanically preventing the leaflet motion may also be contributory. Physical exam is compatible with severe tricuspid regurgitation. I don't think she would be a great candidate for repeat surgery at age 87, but this opportunity might need to be taken if there is evidence of serious hemodynamic complications or liver abnormalities.  We will try to increase the diuretics to see if we can provide some relief of edema 8. HTN: Normal blood pressure, watch for hypotension with diuresis 9. Anticoagulation with previous severe bleeding complications in her liver and brain, continue keeping INR very tightly in the 2.0-2.5 range.  INR was 2.3 today.  Medication Adjustments/Labs and Tests Ordered: Current medicines are reviewed at length with the patient today.  Concerns regarding medicines are outlined above.  Medication changes, Labs and Tests ordered today are listed in the  Patient Instructions below. Patient Instructions  Medication Instructions: Dr Sallyanne Kuster has recommended making the following medication changes: 1. INCREASE Furosemide to 80 mg TWICE daily 2. CHANGE the way you take Potassium - take 2 tablets in the morning and 1 tablet in the afternoon  Your physician recommends that you weigh, daily, at the same time every day, and in the same amount of clothing. Please record your daily  weights on the handout provided and bring it to your next appointment.  Target weight = 125 pounds  Please call the office once you reach your target weight.  Labwork: Your physician recommends that you return for lab work on Tuesday.  Testing/Procedures: NONE ORDERED  Follow-up: Dr Sallyanne Kuster recommends that you schedule a follow-up appointment in 1 month.  If you need a refill on your cardiac medications before your next appointment, please call your pharmacy.      Signed, [name], MD  06/03/2018 4:20 PM    [facility] [address], [place], [place]  [address] Phone: [phone]; Fax: [phone]

## 2018-06-01 NOTE — Patient Instructions (Signed)
Medication Instructions: Dr Sallyanne Kuster has recommended making the following medication changes: 1. INCREASE Furosemide to 80 mg TWICE daily 2. CHANGE the way you take Potassium - take 2 tablets in the morning and 1 tablet in the afternoon  Your physician recommends that you weigh, daily, at the same time every day, and in the same amount of clothing. Please record your daily weights on the handout provided and bring it to your next appointment.  Target weight = 125 pounds  Please call the office once you reach your target weight.  Labwork: Your physician recommends that you return for lab work on Tuesday.  Testing/Procedures: NONE ORDERED  Follow-up: Dr Sallyanne Kuster recommends that you schedule a follow-up appointment in 1 month.  If you need a refill on your cardiac medications before your next appointment, please call your pharmacy.

## 2018-06-02 NOTE — Progress Notes (Signed)
Remote pacemaker transmission.   

## 2018-06-06 DIAGNOSIS — I5033 Acute on chronic diastolic (congestive) heart failure: Secondary | ICD-10-CM | POA: Diagnosis not present

## 2018-06-07 LAB — COMPREHENSIVE METABOLIC PANEL
ALT: 23 IU/L (ref 0–32)
AST: 134 IU/L — AB (ref 0–40)
Albumin/Globulin Ratio: 1.1 — ABNORMAL LOW (ref 1.2–2.2)
Albumin: 4.1 g/dL (ref 3.5–4.8)
Alkaline Phosphatase: 163 IU/L — ABNORMAL HIGH (ref 39–117)
BILIRUBIN TOTAL: 1.5 mg/dL — AB (ref 0.0–1.2)
BUN/Creatinine Ratio: 28 (ref 12–28)
BUN: 33 mg/dL — AB (ref 8–27)
CHLORIDE: 101 mmol/L (ref 96–106)
CO2: 19 mmol/L — ABNORMAL LOW (ref 20–29)
Calcium: 9.9 mg/dL (ref 8.7–10.3)
Creatinine, Ser: 1.19 mg/dL — ABNORMAL HIGH (ref 0.57–1.00)
GFR calc Af Amer: 51 mL/min/{1.73_m2} — ABNORMAL LOW (ref >59)
GFR calc non Af Amer: 44 mL/min/{1.73_m2} — ABNORMAL LOW (ref >59)
GLUCOSE: 83 mg/dL (ref 65–99)
Globulin, Total: 3.8 g/dL (ref 1.5–4.5)
Potassium: 4.4 mmol/L (ref 3.5–5.2)
Sodium: 137 mmol/L (ref 134–144)
Total Protein: 7.9 g/dL (ref 6.0–8.5)

## 2018-06-08 DIAGNOSIS — Z0000 Encounter for general adult medical examination without abnormal findings: Secondary | ICD-10-CM | POA: Diagnosis not present

## 2018-06-08 DIAGNOSIS — I110 Hypertensive heart disease with heart failure: Secondary | ICD-10-CM | POA: Diagnosis not present

## 2018-06-20 ENCOUNTER — Telehealth: Payer: Self-pay | Admitting: Cardiovascular Disease

## 2018-06-20 ENCOUNTER — Other Ambulatory Visit: Payer: Self-pay | Admitting: Cardiovascular Disease

## 2018-06-20 NOTE — Telephone Encounter (Signed)
Spoke with pt who states she wanted to inform Dr. Loletha Grayer that she has reached her target weight of 125 lbs and feeling good. Informed that I would most certainly relay the good message to Dr. Loletha Grayer.

## 2018-06-20 NOTE — Telephone Encounter (Signed)
Thank you MCr 

## 2018-06-20 NOTE — Telephone Encounter (Signed)
New Message         Patient was told to call when she reached the weight of 125

## 2018-06-22 LAB — CUP PACEART REMOTE DEVICE CHECK
Battery Remaining Longevity: 71 mo
Battery Voltage: 2.76 V
Implantable Lead Implant Date: 20080326
Implantable Lead Location: 753860
Implantable Pulse Generator Implant Date: 20151103
Lead Channel Impedance Value: 0 Ohm
Lead Channel Pacing Threshold Amplitude: 0.875 V
Lead Channel Pacing Threshold Pulse Width: 0.4 ms
Lead Channel Setting Pacing Amplitude: 2.5 V
MDC IDC MSMT BATTERY IMPEDANCE: 713 Ohm
MDC IDC MSMT LEADCHNL RV IMPEDANCE VALUE: 462 Ohm
MDC IDC SESS DTM: 20190911180540
MDC IDC SET LEADCHNL RV PACING PULSEWIDTH: 0.4 ms
MDC IDC SET LEADCHNL RV SENSING SENSITIVITY: 2 mV
MDC IDC STAT BRADY RV PERCENT PACED: 37 %

## 2018-06-29 ENCOUNTER — Ambulatory Visit: Payer: Medicare Other | Admitting: Pharmacist Clinician (PhC)/ Clinical Pharmacy Specialist

## 2018-06-29 DIAGNOSIS — Z7901 Long term (current) use of anticoagulants: Secondary | ICD-10-CM | POA: Diagnosis not present

## 2018-06-29 DIAGNOSIS — Z952 Presence of prosthetic heart valve: Secondary | ICD-10-CM

## 2018-06-29 LAB — POCT INR: INR: 3 (ref 2.0–3.0)

## 2018-07-07 ENCOUNTER — Ambulatory Visit (INDEPENDENT_AMBULATORY_CARE_PROVIDER_SITE_OTHER): Payer: Medicare Other | Admitting: Cardiovascular Disease

## 2018-07-07 ENCOUNTER — Encounter: Payer: Self-pay | Admitting: Cardiovascular Disease

## 2018-07-07 ENCOUNTER — Ambulatory Visit (INDEPENDENT_AMBULATORY_CARE_PROVIDER_SITE_OTHER): Payer: Medicare Other | Admitting: Pharmacist Clinician (PhC)/ Clinical Pharmacy Specialist

## 2018-07-07 VITALS — BP 115/59 | HR 62 | Ht 61.0 in | Wt 129.4 lb

## 2018-07-07 DIAGNOSIS — Z7901 Long term (current) use of anticoagulants: Secondary | ICD-10-CM | POA: Diagnosis not present

## 2018-07-07 DIAGNOSIS — I2510 Atherosclerotic heart disease of native coronary artery without angina pectoris: Secondary | ICD-10-CM

## 2018-07-07 DIAGNOSIS — I4811 Longstanding persistent atrial fibrillation: Secondary | ICD-10-CM | POA: Diagnosis not present

## 2018-07-07 DIAGNOSIS — Z950 Presence of cardiac pacemaker: Secondary | ICD-10-CM | POA: Diagnosis not present

## 2018-07-07 DIAGNOSIS — I071 Rheumatic tricuspid insufficiency: Secondary | ICD-10-CM

## 2018-07-07 DIAGNOSIS — I5032 Chronic diastolic (congestive) heart failure: Secondary | ICD-10-CM

## 2018-07-07 DIAGNOSIS — I10 Essential (primary) hypertension: Secondary | ICD-10-CM

## 2018-07-07 DIAGNOSIS — Z952 Presence of prosthetic heart valve: Secondary | ICD-10-CM | POA: Diagnosis not present

## 2018-07-07 DIAGNOSIS — I2721 Secondary pulmonary arterial hypertension: Secondary | ICD-10-CM

## 2018-07-07 LAB — CUP PACEART INCLINIC DEVICE CHECK
Battery Remaining Longevity: 75 mo
Battery Voltage: 2.76 V
Brady Statistic RV Percent Paced: 15 %
Date Time Interrogation Session: 20191017132214
Implantable Lead Implant Date: 20080326
Implantable Pulse Generator Implant Date: 20151103
Lead Channel Pacing Threshold Amplitude: 0.875 V
Lead Channel Pacing Threshold Pulse Width: 0.4 ms
Lead Channel Setting Pacing Amplitude: 2.5 V
Lead Channel Setting Pacing Pulse Width: 0.4 ms
MDC IDC LEAD LOCATION: 753860
MDC IDC MSMT BATTERY IMPEDANCE: 845 Ohm
MDC IDC MSMT LEADCHNL RA IMPEDANCE VALUE: 0 Ohm
MDC IDC MSMT LEADCHNL RV IMPEDANCE VALUE: 488 Ohm
MDC IDC SET LEADCHNL RV SENSING SENSITIVITY: 2 mV

## 2018-07-07 LAB — POCT INR: INR: 2.4 (ref 2.0–3.0)

## 2018-07-07 NOTE — Patient Instructions (Signed)
Medication Instructions:  Dr Sallyanne Kuster recommends that you continue on your current medications as directed. Please refer to the Current Medication list given to you today.  If you need a refill on your cardiac medications before your next appointment, please call your pharmacy.   Testing/Procedures: Remote monitoring is used to monitor your Pacemaker of ICD from home. This monitoring reduces the number of office visits required to check your device to one time per year. It allows [place] to keep an eye on the functioning of your device to ensure it is working properly. You are scheduled for a device check from home on Wednesday, December 11th, 2019. You may send your transmission at any time that day. If you have a wireless device, the transmission will be sent automatically. After your physician reviews your transmission, you will receive a postcard with your next transmission date.  Follow-Up: At [facility], you and your health needs are our priority.  As part of our continuing mission to provide you with exceptional heart care, we have created designated Provider Care Teams.  These Care Teams include your primary Cardiologist (physician) and Advanced Practice Providers (APPs -  Physician Assistants and Nurse Practitioners) who all work together to provide you with the care you need, when you need it. You will need a follow up appointment in 6 months.  Please call our office 2 months in advance to schedule this appointment.  You may see [name], MD or one of the following Advanced Practice Providers on your designated Care Team: [place], [place] . Fabian Sharp, PA-C

## 2018-07-07 NOTE — Progress Notes (Signed)
Patient ID: Carrie [name], female   DOB: 11/21/1940, 77 y.o.   MRN: [id number] Patient ID: Carrie [name], female   DOB: [birth year], 77 y.o.   MRN: [id number]    Cardiology Office Note    Date:  07/08/2018   ID:  Carrie [name], DOB 02-03-1941, MRN [id number]  PCP:  Lucianne Lei, MD  Cardiologist:   [name], MD   No chief complaint on file.   History of Present Illness:  Carrie [name] is a 77 y.o. 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   At her last appointment she was having problems with substantial lower extremity edema despite wearing compression stockings and we increased her diuretic.  Her weight has decreased to 125 pounds on her home scale, which was our target.  The edema is virtually resolved (while still wearing compression stockings).  She denies dizziness, weakness or muscle cramps.  Office scale overestimates her weight by 4 pounds, compared to her home scale.  She denies falls, injuries, bleeding, angina at rest or with activity, palpitations, syncope.  Her blood pressure has been normal.  She has not had any falls or bleeding problems.  He denies palpitations, syncope or dizziness.  Her blood pressure has not been low (she checks it once a day).  Presenting rhythm is atrial fibrillation with a ventricular rate of 62 bpm.  Surprisingly, at her 04/21/2017 appointment she was in sinus rhythm, which had not been documented in a long time, but 05/11/2017 her rhythm was  clearly irregular again and at all subsequent visits she has been in atrial fibrillation with intermittent ventricular pacing.  She has a single-chamber Medtronic [place] single chamber pacemaker (implant 2008, generator change 2015) set at lower rate limit of 50 bpm, to try to limit the frequency of ventricular pacing, since this seems to worsen heart failure.  In December 2012 she had subarachnoid hemorrhage and in June 2013 she had intrahepatic hemorrhage requiring arterial embolization and we are keeping her INR in the lower range (2.0-2.5). She has normal coronary arteries by previous angiography (2004) normal left ventricular systolic function by echocardiography (Oct 2015).  She has severe tricuspid insufficiency.  Last nuclear stress test 04/29/2017:   The left ventricular ejection fraction is hyperdynamic (>65%).  Nuclear stress EF: 67%.  There was no ST segment deviation noted during stress.  The study is normal.  This is a low risk study.   Normal pharmacologic nuclear stress test with no evidence for prior infarct or ischemia.  Last echo 10/01/2016:  Study Conclusions  - Left ventricle: Systolic function was normal. The estimated ejection fraction was in the range of 50% to 55%. Wall motion was   normal; there were no regional wall motion abnormalities. Mitral valve prosthesis and atrial fibrillation prevents evaluation of   LV diastolic function. - Ventricular septum: Septal motion showed paradox. The contour showed diastolic flattening. These changes are consistent with RV volume overload. - Aortic valve: Right coronary cusp mobility was moderately restricted. There was trivial regurgitation. - Mitral valve: A mechanical prosthesis was present and functioning normally. - Left atrium: The  atrium was severely dilated. - Right ventricle: The cavity size was moderately dilated. Systolic function was mildly to moderately reduced. - Right atrium: The atrium was severely  dilated. - Atrial septum: No defect or patent foramen ovale was identified. - Tricuspid valve: There was severe regurgitation. - Pulmonary arteries: Systolic pressure was moderately increased. PA peak pressure: 55 mm Hg (S).  Past Medical History:  Diagnosis Date  . Anticoagulated on Coumadin    for mech valve and atrial fib  goal 2.0-2.5  . Anxiety   . Arthritis    "right shoulder" (03/15/2013)  . Atrial fibrillation ([place])   . Atrial flutter ([place])   . CAD (coronary artery disease) 08/11/2016  . CHF (congestive heart failure) ([place])   . Chronic combined systolic and diastolic CHF (congestive heart failure) ([place])    a. 02/2016 Echo: EF 45-50%.  . Diverticula of colon   . Exertional shortness of breath   . GERD (gastroesophageal reflux disease)   . Heart murmur   . Hemorrhage intraabdominal 03/17/2012  . History of blood transfusion    "once" (03/15/2013)  . Hyperlipidemia   . Hypertension   . Liver hemorrhage   . Migraines   . 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 Bi-leaflet St. Jude mechanical prosthesis; b. 02/2016 Echo: EF 45-50%, some degree of MR, sev dil LA/RA, sev TR.  . NSTEMI (non-ST elevated myocardial infarction) ([place])    a. 02/2016 elev trop/Cath: nonobs dzs,   . Pacemaker    medtronic adapta  . Pneumonia 2009   resolved.? OPD Rx  . Severe tricuspid regurgitation    a. 02/2016 Echo: Ef 45-50%, sev TR, PASP 53mmHg.  . Sick sinus syndrome ([place])    Dr Cristopher Peru. EP study negative. Pacemaker 12/15/06 Medtronic  . Stroke [facility])    "they say I had a stroke last year" denies residual on 03/15/2013  . Subdural hematoma [facility])     Past Surgical History:  Procedure Laterality Date  . APPENDECTOMY    . CARDIAC CATHETERIZATION  04/02/97   R&L:severe MR/pulmonary hypertension  . CARDIAC CATHETERIZATION N/A 03/16/2016   Procedure: Left Heart Cath and Coronary Angiography;  Surgeon: [name], MD;  Location: [place] [facility];  Service: Cardiovascular;   Laterality: N/A;  . CARDIAC CATHETERIZATION N/A 09/30/2016   Procedure: Left Heart Cath and Coronary Angiography;  Surgeon: [name], MD;  Location: [place] [facility];  Service: Cardiovascular;  Laterality: N/A;  . CARDIAC CATHETERIZATION N/A 09/30/2016   Procedure: Coronary Stent Intervention;  Surgeon: [name], MD;  3.5 x 12 mm resolute Onyx  to ostial RCA  . CATARACT EXTRACTION W/ INTRAOCULAR LENS  IMPLANT, BILATERAL Bilateral 2013  . CORONARY ANGIOPLASTY    . DILATION AND CURETTAGE OF UTERUS     "had fibroids" (03/15/2013)  . EXPLORATORY LAPAROTOMY     "had a growth on my intestines" (03/15/2013)  . HAMMER TOE SURGERY Right   . INSERT / REPLACE / REMOVE PACEMAKER  12/15/2006   Medtronic  . MITRAL VALVE REPLACEMENT  1998   St Jude mechanical; Dr. [name]  . PACEMAKER PLACEMENT  12/15/06   medtronic adapta for SSS  . PERMANENT PACEMAKER GENERATOR CHANGE N/A 07/24/2014   Procedure: PERMANENT PACEMAKER GENERATOR CHANGE;  Surgeon: [name], MD;  Location: [place] [facility];  Service: Cardiovascular;  Laterality: N/A;  . PERSANTINE CARDIOLITE  08/07/03   mild inf. ischemia   . TEE WITHOUT CARDIOVERSION  09/23/2011   Procedure: TRANSESOPHAGEAL ECHOCARDIOGRAM (TEE);  Surgeon: [name];  Location: [facility];  Service: Cardiovascular;  Laterality: N/A;  . TONSILLECTOMY    . TUBAL LIGATION    . US ECHOCARDIOGRAPHY  11/19/2011   EF 50-55%,RA mod to severely dilated,LA severely dilated,trace MR,small vegetation or mass on the MV,AOV mildly scleroticmild PI, RV pressure 40-54mmHg    Outpatient Medications Prior to Visit  Medication Sig Dispense Refill  . atorvastatin (LIPITOR) 40 MG tablet Take 1 tablet (40 mg total) by mouth daily at 6 PM. 30 tablet 12  . Biotin 1000 MCG tablet Take 1,000 mcg by mouth daily.    Marland Kitchen diltiazem (CARDIZEM CD) 120 MG 24 hr capsule TAKE ONE CAPSULE BY MOUTH DAILY 90 capsule 1  . docusate sodium (COLACE) 100 MG capsule Take 200 mg by mouth 2  (two) times daily.     . fluticasone (FLONASE) 50 MCG/ACT nasal spray SHAKE LQ AND U 1 SPR IEN BID  1  . furosemide (LASIX) 80 MG tablet Take 1 tablet (80 mg total) by mouth 2 (two) times daily. 180 tablet 3  . metoprolol tartrate (LOPRESSOR) 25 MG tablet Take 1 tablet (25 mg total) by mouth 2 (two) times daily. 180 tablet 3  . Multiple Vitamin (MULTIVITAMINS PO) Take 1 tablet by mouth daily.     . nitroGLYCERIN (NITROSTAT) 0.4 MG SL tablet Place 1 tablet (0.4 mg total) under the tongue every 5 (five) minutes as needed for chest pain. 225 tablet 0  . Omega-3 Fatty Acids (FISH OIL) 1000 MG CAPS Take 1,000 mg by mouth daily.    . polyethylene glycol (MIRALAX / GLYCOLAX) packet Take 17 g by mouth as needed (for constipation). Reported on 03/25/2016    . potassium chloride SA (K-DUR,KLOR-CON) 20 MEQ tablet Take 2 tablets (40 mEq total) by mouth every morning AND 1 tablet (20 mEq total) every evening. 270 tablet 3  . warfarin (COUMADIN) 5 MG tablet TAKE 1 TO 1 AND 1/2 TABLETS BY MOUTH DAILY AS DIRECTED (Patient taking differently: TAKE 5MG  DAILY EXCEPT ON MONDAYS TAKE 7.5MG .) 120 tablet 1  . dextromethorphan-guaiFENesin (MUCINEX DM) 30-600 MG 12hr tablet Take 1 tablet by mouth 2 (two) times daily as needed for cough. (Patient not taking: Reported on 07/07/2018) 20 tablet 0   No facility-administered medications prior to visit.      Allergies:   Codeine   Social History   Socioeconomic History  . Marital status: Married    Spouse name: Not on file  . Number of children: Not on file  . Years of education: Not on file  . Highest education level: Not on file  Occupational History  . Not on file  Social Needs  . Financial resource strain: Not on file  . Food insecurity:    Worry: Not on file    Inability: Not on file  . Transportation needs:    Medical: Not on file    Non-medical: Not on file  Tobacco Use  . Smoking status: Never Smoker  . Smokeless tobacco: Never Used  Substance and Sexual  Activity  . Alcohol use: No  . Drug use: No  . Sexual activity: Never  Lifestyle  . Physical activity:    Days per week: Not on file    Minutes per session: Not on file  . Stress: Not on file  Relationships  . Social connections:    Talks on phone: Not on file    Gets together: Not on file    Attends religious service: Not on file  Active member of club or organization: Not on file    Attends meetings of clubs or organizations: Not on file    Relationship status: Not on file  Other Topics Concern  . Not on file  Social History Narrative  . Not on file     Family History:  The patient's family history includes Cancer in her mother and sister; Diabetes in her sister; Healthy in her brother, brother, brother, and sister; Heart attack in her maternal grandfather; Kidney disease in her daughter; Leukemia in her sister; Stroke in her brother and father.   ROS:   Please see the history of present illness.    All other systems are reviewed and are negative   PHYSICAL EXAM:   VS:  BP (!) 115/59   Pulse 62   Ht 5\' 1"  (1.549 m)   Wt 129 lb 6.4 oz (58.7 kg)   BMI 24.45 kg/m     General: Alert, oriented x3, no distress, looks comfortable Head: no evidence of trauma, PERRL, EOMI, no exophtalmos or lid lag, no myxedema, no xanthelasma; normal ears, nose and oropharynx Neck: JVP approximately average 2-4 cm above the sternal angle with extremely prominent V waves; brisk carotid pulses without delay and no carotid bruits Chest: clear to auscultation, no signs of consolidation by percussion or palpation, normal fremitus, symmetrical and full respiratory excursions Cardiovascular: normal position and quality of the apical impulse, regular rhythm, normal first and second heart sounds, harsh 3-6 holosystolic murmur heard primarily at the left lower sternal border with hyper pulsatility of the epigastrium, no diastolic murmurs, rubs or gallops Abdomen: no tenderness or distention, no masses by  palpation, no abnormal pulsatility or arterial bruits, normal bowel sounds, no hepatosplenomegaly Extremities: no clubbing, cyanosis or edema; 2+ radial, ulnar and brachial pulses bilaterally; 2+ right femoral, posterior tibial and dorsalis pedis pulses; 2+ left femoral, posterior tibial and dorsalis pedis pulses; no subclavian or femoral bruits Neurological: grossly nonfocal Psych: Normal mood and affect   Wt Readings from Last 3 Encounters:  07/07/18 129 lb 6.4 oz (58.7 kg)  06/01/18 133 lb 12.8 oz (60.7 kg)  04/27/18 127 lb (57.6 kg)      Studies/Labs Reviewed:   EKG:  EKG is not ordered today.  ECG at last appointment showed atrial fibrillation with several ventricular paced beats and an overall rate of 65 bpm.  During native AV conduction there is a nonspecific intraventricular conduction delay with a total QRS duration of about 140 ms.  The QTC is 470 ms  Recent Labs: 03/30/2018: B Natriuretic Peptide 1,780.8; Hemoglobin 9.0; Platelets 212 06/06/2018: ALT 23; BUN 33; Creatinine, Ser 1.19; Potassium 4.4; Sodium 137   Lipid Panel     Component Value Date/Time   CHOL 87 03/31/2018 0112   TRIG 46 03/31/2018 0112   HDL 31 (L) 03/31/2018 0112   CHOLHDL 2.8 03/31/2018 0112   VLDL 9 03/31/2018 0112   LDLCALC 47 03/31/2018 0112     ASSESSMENT:    1. Chronic diastolic heart failure ([place])   2. Coronary artery disease involving native coronary artery of native heart without angina pectoris   3. PAH (pulmonary artery hypertension) ([place])   4. Longstanding persistent atrial fibrillation   5. Pacemaker   6. History of mitral valve replacement with mechanical valve   7. Severe tricuspid regurgitation   8. Essential hypertension   9. Long term current use of anticoagulant      PLAN:  In order of problems listed above:  1. CHF:  Appears closer to euvolemic status.  Reset her "dry weight" at 125 pounds.  NYHA functional class II.  Note the fact that she has moderate pulmonary artery  hypertension with normal left ventricular end-diastolic pressure (EDP 13 at cath). She probably has primarily fixed pulmonary hypertension related to previous mitral valve disease. Clinical and echo findings are of right heart failure, usually not left heart failure.  Continue the current dose of diuretic  2. CAD s/p NSTEMI and DES-RCA: Almost 2 years since her event.  Off antiplatelet agents, on warfarin, history of serious bleeding complications in the past.  Angina free 3. PAH: This will likely lead to some degree of exertional dyspnea even when she is euvolemic.  This was present even when she had normal LVEDP. Normally functioning mitral valve prosthesis. She has fixed pulmonary hypertension related to previous mitral valve disease. No clear symptoms of obstructive sleep apnea. There is no history of pulmonary embolism or COPD.  It is therefore expected that she will have residual dyspnea even when perfectly euvolemic 4. AFib: Long-term persistent, probably permanent atrial fibrillation, surprisingly at her August 2018 appointment had normal sinus rhythm, but now back in atrial fibrillation. The change in rhythm doesn't seem to be associated with any particular change hemodynamically. Antiarrhythmics do not appear to be justified.  Clinical standpoint appears to do better when she has less ventricular pacing.  The current dose of metoprolol seems to be striking the right balance between excessive ventricular pacing and adequate ventricular rate control. 5. PPM: Normal device function.   Remote download in 3 months, office visit in 6 months 6. Mechanical mitral valve replacement: She has a large diameter valve that had normal function by her echocardiogram.  It is unlikely that she will develop a major increase in transvalvular gradients even when her heart rate is a little faster. 7. Severe TR is the major cause of her edema and the jugular venous distention. She may have some degree of rheumatic valve  disease of the tricuspid valve The most important cause is likely to be moderate pulmonary artery hypertension. The presence of a transvalvular pacemaker lead, mechanically preventing the leaflet motion may also be contributory. Physical exam is compatible with severe tricuspid regurgitation. I don't think she would be a great candidate for repeat surgery at age 55, but this opportunity might need to be taken if there is evidence of serious hemodynamic complications or liver abnormalities.  With the increased dose of diuretics she is now no longer edematous, although she still has some degree of hepatomegaly and hyper pulsatility. 8. HTN: Has not developed hypotension with the extra dose of diuretic. 9. Anticoagulation with previous severe bleeding complications in her liver and brain, continue keeping INR very tightly in the 2.0-2.5 range.  INR was 2.4 today.  Medication Adjustments/Labs and Tests Ordered: Current medicines are reviewed at length with the patient today.  Concerns regarding medicines are outlined above.  Medication changes, Labs and Tests ordered today are listed in the Patient Instructions below. Patient Instructions  Medication Instructions:  Dr Sallyanne Kuster recommends that you continue on your current medications as directed. Please refer to the Current Medication list given to you today.  If you need a refill on your cardiac medications before your next appointment, please call your pharmacy.   Testing/Procedures: Remote monitoring is used to monitor your Pacemaker of ICD from home. This monitoring reduces the number of office visits required to check your device to one time per year. It allows [place] to keep  an eye on the functioning of your device to ensure it is working properly. You are scheduled for a device check from home on Wednesday, December 11th, 2019. You may send your transmission at any time that day. If you have a wireless device, the transmission will be sent automatically.  After your physician reviews your transmission, you will receive a postcard with your next transmission date.  Follow-Up: At [facility], you and your health needs are our priority.  As part of our continuing mission to provide you with exceptional heart care, we have created designated Provider Care Teams.  These Care Teams include your primary Cardiologist (physician) and Advanced Practice Providers (APPs -  Physician Assistants and Nurse Practitioners) who all work together to provide you with the care you need, when you need it. You will need a follow up appointment in 6 months.  Please call our office 2 months in advance to schedule this appointment.  You may see [name], MD or one of the following Advanced Practice Providers on your designated Care Team: [place], [place] . Fabian Sharp, PA-C      Signed, [name], MD  07/08/2018 3:33 PM    [address], [place], [place]  [address] Phone: [phone]; Fax: [phone]

## 2018-07-20 DIAGNOSIS — Z950 Presence of cardiac pacemaker: Secondary | ICD-10-CM | POA: Diagnosis not present

## 2018-07-20 DIAGNOSIS — I10 Essential (primary) hypertension: Secondary | ICD-10-CM | POA: Diagnosis not present

## 2018-07-20 DIAGNOSIS — I2721 Secondary pulmonary arterial hypertension: Secondary | ICD-10-CM | POA: Diagnosis not present

## 2018-07-20 DIAGNOSIS — I5032 Chronic diastolic (congestive) heart failure: Secondary | ICD-10-CM | POA: Diagnosis not present

## 2018-07-20 DIAGNOSIS — I4811 Longstanding persistent atrial fibrillation: Secondary | ICD-10-CM | POA: Diagnosis not present

## 2018-07-27 ENCOUNTER — Ambulatory Visit: Payer: Medicare Other | Admitting: Pharmacist

## 2018-07-27 DIAGNOSIS — Z7901 Long term (current) use of anticoagulants: Secondary | ICD-10-CM | POA: Diagnosis not present

## 2018-07-27 DIAGNOSIS — Z952 Presence of prosthetic heart valve: Secondary | ICD-10-CM | POA: Diagnosis not present

## 2018-07-27 LAB — POCT INR: INR: 2.7 (ref 2.0–3.0)

## 2018-08-17 DIAGNOSIS — Z1231 Encounter for screening mammogram for malignant neoplasm of breast: Secondary | ICD-10-CM | POA: Diagnosis not present

## 2018-08-24 ENCOUNTER — Ambulatory Visit: Payer: Medicare Other | Admitting: Pharmacist

## 2018-08-24 DIAGNOSIS — Z7901 Long term (current) use of anticoagulants: Secondary | ICD-10-CM

## 2018-08-24 DIAGNOSIS — Z952 Presence of prosthetic heart valve: Secondary | ICD-10-CM

## 2018-08-24 LAB — POCT INR: INR: 2.4 (ref 2.0–3.0)

## 2018-08-31 ENCOUNTER — Telehealth: Payer: Self-pay | Admitting: Cardiology

## 2018-08-31 NOTE — Telephone Encounter (Signed)
LMOVM reminding pt to send remote transmission.   

## 2018-09-02 ENCOUNTER — Encounter: Payer: Self-pay | Admitting: Cardiology

## 2018-09-22 DIAGNOSIS — I482 Chronic atrial fibrillation, unspecified: Secondary | ICD-10-CM | POA: Diagnosis not present

## 2018-09-22 DIAGNOSIS — I110 Hypertensive heart disease with heart failure: Secondary | ICD-10-CM | POA: Diagnosis not present

## 2018-09-22 DIAGNOSIS — I5032 Chronic diastolic (congestive) heart failure: Secondary | ICD-10-CM | POA: Diagnosis not present

## 2018-09-22 DIAGNOSIS — Z950 Presence of cardiac pacemaker: Secondary | ICD-10-CM | POA: Diagnosis not present

## 2018-09-22 DIAGNOSIS — K439 Ventral hernia without obstruction or gangrene: Secondary | ICD-10-CM | POA: Diagnosis not present

## 2018-09-27 ENCOUNTER — Other Ambulatory Visit: Payer: Self-pay | Admitting: Cardiovascular Disease

## 2018-09-27 NOTE — Telephone Encounter (Signed)
Rx request sent to pharmacy.  

## 2018-09-28 ENCOUNTER — Ambulatory Visit: Payer: Medicare Other | Admitting: Pharmacist

## 2018-09-28 DIAGNOSIS — Z952 Presence of prosthetic heart valve: Secondary | ICD-10-CM | POA: Diagnosis not present

## 2018-09-28 DIAGNOSIS — Z7901 Long term (current) use of anticoagulants: Secondary | ICD-10-CM

## 2018-09-28 LAB — POCT INR: INR: 2.4 (ref 2.0–3.0)

## 2018-10-10 DIAGNOSIS — I5032 Chronic diastolic (congestive) heart failure: Secondary | ICD-10-CM | POA: Diagnosis not present

## 2018-10-10 DIAGNOSIS — K439 Ventral hernia without obstruction or gangrene: Secondary | ICD-10-CM | POA: Diagnosis not present

## 2018-10-10 DIAGNOSIS — Z950 Presence of cardiac pacemaker: Secondary | ICD-10-CM | POA: Diagnosis not present

## 2018-10-10 DIAGNOSIS — I110 Hypertensive heart disease with heart failure: Secondary | ICD-10-CM | POA: Diagnosis not present

## 2018-11-02 ENCOUNTER — Ambulatory Visit: Payer: Medicare Other | Admitting: Pharmacist Clinician (PhC)/ Clinical Pharmacy Specialist

## 2018-11-02 DIAGNOSIS — Z952 Presence of prosthetic heart valve: Secondary | ICD-10-CM | POA: Diagnosis not present

## 2018-11-02 DIAGNOSIS — Z7901 Long term (current) use of anticoagulants: Secondary | ICD-10-CM | POA: Diagnosis not present

## 2018-11-02 LAB — POCT INR: INR: 3.5 — AB (ref 2.0–3.0)

## 2018-11-23 ENCOUNTER — Ambulatory Visit: Payer: Medicare Other | Admitting: Pharmacist Clinician (PhC)/ Clinical Pharmacy Specialist

## 2018-11-23 DIAGNOSIS — Z952 Presence of prosthetic heart valve: Secondary | ICD-10-CM

## 2018-11-23 DIAGNOSIS — Z7901 Long term (current) use of anticoagulants: Secondary | ICD-10-CM | POA: Diagnosis not present

## 2018-11-23 LAB — POCT INR: INR: 3.1 — AB (ref 2.0–3.0)

## 2018-11-28 ENCOUNTER — Other Ambulatory Visit: Payer: Self-pay | Admitting: Cardiovascular Disease

## 2018-12-09 DIAGNOSIS — I110 Hypertensive heart disease with heart failure: Secondary | ICD-10-CM | POA: Diagnosis not present

## 2018-12-09 DIAGNOSIS — I10 Essential (primary) hypertension: Secondary | ICD-10-CM | POA: Diagnosis not present

## 2018-12-09 DIAGNOSIS — I5032 Chronic diastolic (congestive) heart failure: Secondary | ICD-10-CM | POA: Diagnosis not present

## 2018-12-09 DIAGNOSIS — I4811 Longstanding persistent atrial fibrillation: Secondary | ICD-10-CM | POA: Diagnosis not present

## 2018-12-12 ENCOUNTER — Telehealth: Payer: Self-pay

## 2018-12-12 NOTE — Telephone Encounter (Signed)

## 2018-12-14 ENCOUNTER — Ambulatory Visit (INDEPENDENT_AMBULATORY_CARE_PROVIDER_SITE_OTHER): Payer: Medicare Other | Admitting: Pharmacist Clinician (PhC)/ Clinical Pharmacy Specialist

## 2018-12-14 ENCOUNTER — Telehealth: Payer: Self-pay | Admitting: Cardiovascular Disease

## 2018-12-14 ENCOUNTER — Other Ambulatory Visit: Payer: Self-pay

## 2018-12-14 DIAGNOSIS — Z952 Presence of prosthetic heart valve: Secondary | ICD-10-CM | POA: Diagnosis not present

## 2018-12-14 DIAGNOSIS — Z7901 Long term (current) use of anticoagulants: Secondary | ICD-10-CM

## 2018-12-14 LAB — POCT INR: INR: 3.1 — AB (ref 2.0–3.0)

## 2018-12-14 NOTE — Telephone Encounter (Signed)
New Message    *STAT* If patient is at the pharmacy, call can be transferred to refill team.   1. Which medications need to be refilled? (please list name of each medication and dose if known) warfarin (COUMADIN) 5 MG tablet   2. Which pharmacy/location (including street and city if local pharmacy) is medication to be sent to? [facility] [place], [place]  3. Do they need a 30 day or 90 day supply? 90 day

## 2018-12-15 MED ORDER — WARFARIN SODIUM 5 MG PO TABS: ORAL_TABLET | ORAL | 1 refills | Status: DC

## 2019-01-03 ENCOUNTER — Telehealth: Payer: Self-pay

## 2019-01-03 NOTE — Telephone Encounter (Signed)

## 2019-01-04 ENCOUNTER — Ambulatory Visit (INDEPENDENT_AMBULATORY_CARE_PROVIDER_SITE_OTHER): Payer: Medicare Other | Admitting: *Deleted

## 2019-01-04 ENCOUNTER — Other Ambulatory Visit: Payer: Self-pay

## 2019-01-04 DIAGNOSIS — Z5181 Encounter for therapeutic drug level monitoring: Secondary | ICD-10-CM | POA: Diagnosis not present

## 2019-01-04 DIAGNOSIS — Z952 Presence of prosthetic heart valve: Secondary | ICD-10-CM

## 2019-01-04 DIAGNOSIS — Z7901 Long term (current) use of anticoagulants: Secondary | ICD-10-CM | POA: Diagnosis not present

## 2019-01-04 LAB — POCT INR: INR: 3.7 — AB (ref 2.0–3.0)

## 2019-01-05 ENCOUNTER — Telehealth: Payer: Self-pay

## 2019-01-05 NOTE — Telephone Encounter (Signed)
Virtual Visit Pre-Appointment Phone Call  Steps For Call:  1. Confirm consent - "In the setting of the current Covid19 crisis, you are scheduled for a (phone or video) visit with your provider on (01/11/19) at (11AM).  Just as we do with many in-office visits, in order for you to participate in this visit, we must obtain consent.  If you'd like, I can send this to your mychart (if signed up) or email for you to review.  Otherwise, I can obtain your verbal consent now.  All virtual visits are billed to your insurance company just like a normal visit would be.  By agreeing to a virtual visit, we'd like you to understand that the technology does not allow for your provider to perform an examination, and thus may limit your provider's ability to fully assess your condition.  Finally, though the technology is pretty good, we cannot assure that it will always work on either your or our end, and in the setting of a video visit, we may have to convert it to a phone-only visit.  In either situation, we cannot ensure that we have a secure connection.  Are you willing to proceed?" STAFF: Did the patient verbally acknowledge consent to telehealth visit? Document YES/NO here: YES  2. Confirm the BEST phone number to call the day of the visit by including in appointment notes  3. Give patient instructions for WebEx/MyChart download to smartphone as below or Doximity/Doxy.me if video visit (depending on what platform provider is using)  4. Advise patient to be prepared with their blood pressure, heart rate, weight, any heart rhythm information, their current medicines, and a piece of paper and pen handy for any instructions they may receive the day of their visit  5. Inform patient they will receive a phone call 15 minutes prior to their appointment time (may be from unknown caller ID) so they should be prepared to answer  6. Confirm that appointment type is correct in Epic appointment notes (VIDEO vs PHONE)     TELEPHONE CALL NOTE  Sesser has been deemed a candidate for a follow-up tele-health visit to limit community exposure during the Covid-19 pandemic. I spoke with the patient via phone to ensure availability of phone/video source, confirm preferred email & phone number, and discuss instructions and expectations.  I reminded Jerl Santos to be prepared with any vital sign and/or heart rhythm information that could potentially be obtained via home monitoring, at the time of her visit. I reminded ALANDRA SANDO to expect a phone call at the time of her visit if her visit.  Crissie Reese, CMA 01/05/2019 10:30 AM   INSTRUCTIONS FOR DOWNLOADING THE WEBEX APP TO SMARTPHONE  - If Apple, ask patient to go to [employer] and type in WebEx in the search bar. [place] [employer], the blue/green circle. If Android, go to [employer] and type in [employer] in the search bar. The app is free but as with any other app downloads, their phone may require them to verify saved payment information or Apple/Android password.  - The patient does NOT have to create an account. - On the day of the visit, the assist will walk the patient through joining the meeting with the meeting number/password.  INSTRUCTIONS FOR DOWNLOADING THE MYCHART APP TO SMARTPHONE  - The patient must first make sure to have activated MyChart and know their login information - If Apple, go to [employer] and type in  MyChart in the search bar and download the app. If Android, ask patient to go to [employer] and type in [place] in the search bar and download the app. The app is free but as with any other app downloads, their phone may require them to verify saved payment information or Apple/Android password.  - The patient will need to then log into the app with their MyChart username and password, and select [facility] as their healthcare provider to link the account. When it is time for your visit, go to the MyChart  app, find appointments, and click Begin Video Visit. Be sure to Select Allow for your device to access the Microphone and Camera for your visit. You will then be connected, and your provider will be with you shortly.  **If they have any issues connecting, or need assistance please contact MyChart service desk [phone])**  **If using a computer, in order to ensure the best quality for their visit they will need to use either of the following Internet Browsers: [employer], or Google Chrome**  IF USING DOXIMITY or DOXY.ME - The patient will receive a link just prior to their visit, either by text or email (to be determined day of appointment depending on if it's doxy.me or Doximity).     FULL LENGTH CONSENT FOR TELE-HEALTH VISIT   I hereby voluntarily request, consent and authorize [facility] and its employed or contracted physicians, physician assistants, nurse practitioners or other licensed health care professionals (the Practitioner), to provide me with telemedicine health care services (the "Services") as deemed necessary by the treating Practitioner. I acknowledge and consent to receive the Services by the Practitioner via telemedicine. I understand that the telemedicine visit will involve communicating with the Practitioner through live audiovisual communication technology and the disclosure of certain medical information by electronic transmission. I acknowledge that I have been given the opportunity to request an in-person assessment or other available alternative prior to the telemedicine visit and am voluntarily participating in the telemedicine visit.  I understand that I have the right to withhold or withdraw my consent to the use of telemedicine in the course of my care at any time, without affecting my right to future care or treatment, and that the Practitioner or I may terminate the telemedicine visit at any time. I understand that I have the right to inspect all  information obtained and/or recorded in the course of the telemedicine visit and may receive copies of available information for a reasonable fee.  I understand that some of the potential risks of receiving the Services via telemedicine include:  Marland Kitchen Delay or interruption in medical evaluation due to technological equipment failure or disruption; . Information transmitted may not be sufficient (e.g. poor resolution of images) to allow for appropriate medical decision making by the Practitioner; and/or  . In rare instances, security protocols could fail, causing a breach of personal health information.  Furthermore, I acknowledge that it is my responsibility to provide information about my medical history, conditions and care that is complete and accurate to the best of my ability. I acknowledge that Practitioner's advice, recommendations, and/or decision may be based on factors not within their control, such as incomplete or inaccurate data provided by me or distortions of diagnostic images or specimens that may result from electronic transmissions. I understand that the practice of medicine is not an exact science and that Practitioner makes no warranties or guarantees regarding treatment outcomes. I acknowledge that I will receive a copy  of this consent concurrently upon execution via email to the email address I last provided but may also request a printed copy by calling the office of [facility].    I understand that my insurance will be billed for this visit.   I have read or had this consent read to me. . I understand the contents of this consent, which adequately explains the benefits and risks of the Services being provided via telemedicine.  . I have been provided ample opportunity to ask questions regarding this consent and the Services and have had my questions answered to my satisfaction. . I give my informed consent for the services to be provided through the use of telemedicine in my  medical care  By participating in this telemedicine visit I agree to the above.

## 2019-01-10 ENCOUNTER — Telehealth: Payer: Self-pay

## 2019-01-10 ENCOUNTER — Telehealth: Payer: Self-pay | Admitting: Cardiovascular Disease

## 2019-01-10 NOTE — Telephone Encounter (Signed)
Spoke with patient's husband during pre reg call. He has concerns about fluid build up in both legs, says it's a little more than usual.

## 2019-01-10 NOTE — Telephone Encounter (Signed)
Hey Dr Sallyanne Kuster, they moved this patient to your schedule for tomorrow but ill ask about weight and bp when I call her in just a moment.

## 2019-01-10 NOTE — Telephone Encounter (Signed)
Virtual Visit Pre-Appointment Phone Call  "Carrie [name], I am calling you today to discuss your upcoming appointment. We are currently trying to limit exposure to the virus that causes COVID-19 by seeing patients at home rather than in the office."  1. "What is the BEST phone number to call the day of the visit?" - include this in appointment notes  2. "Do you have or have access to (through a family member/friend) a smartphone with video capability that we can use for your visit?" a. If yes - list this number in appt notes as "cell" (if different from BEST phone #) and list the appointment type as a VIDEO visit in appointment notes b. If no - list the appointment type as a PHONE visit in appointment notes  3. Confirm consent - "In the setting of the current Covid19 crisis, you are scheduled for a VIDEO visit with your provider on 01/11/2019 at 3:20pm.  Just as we do with many in-office visits, in order for you to participate in this visit, we must obtain consent.  If you'd like, I can send this to your mychart (if signed up) or email for you to review.  Otherwise, I can obtain your verbal consent now.  All virtual visits are billed to your insurance company just like a normal visit would be.  By agreeing to a virtual visit, we'd like you to understand that the technology does not allow for your provider to perform an examination, and thus may limit your provider's ability to fully assess your condition. If your provider identifies any concerns that need to be evaluated in person, we will make arrangements to do so.  Finally, though the technology is pretty good, we cannot assure that it will always work on either your or our end, and in the setting of a video visit, we may have to convert it to a phone-only visit.  In either situation, we cannot ensure that we have a secure connection.  Are you willing to proceed?" STAFF: Did the patient verbally acknowledge consent to telehealth visit? Document YES/NO  here: YES  4. Advise patient to be prepared - "Two hours prior to your appointment, go ahead and check your blood pressure, pulse, oxygen saturation, and your weight (if you have the equipment to check those) and write them all down. When your visit starts, your provider will ask you for this information. If you have an Apple Watch or Kardia device, please plan to have heart rate information ready on the day of your appointment. Please have a pen and paper handy nearby the day of the visit as well."  5. Give patient instructions for MyChart download to smartphone OR Doximity/Doxy.me as below if video visit (depending on what platform provider is using)  6. Inform patient they will receive a phone call 15 minutes prior to their appointment time (may be from unknown caller ID) so they should be prepared to answer    TELEPHONE CALL NOTE  Carrie [name] has been deemed a candidate for a follow-up tele-health visit to limit community exposure during the Covid-19 pandemic. I spoke with the patient via phone to ensure availability of phone/video source, confirm preferred email & phone number, and discuss instructions and expectations.  I reminded Carrie [name] to be prepared with any vital sign and/or heart rhythm information that could potentially be obtained via home monitoring, at the time of her visit. I reminded Carrie [name] to expect a phone call prior to her  visit.  Harold Hedge, [place] 01/10/2019 11:55 AM   INSTRUCTIONS FOR DOWNLOADING THE MYCHART APP TO SMARTPHONE  - The patient must first make sure to have activated MyChart and know their login information - If Apple, go to [employer] and type in MyChart in the search bar and download the app. If Android, ask patient to go to [employer] and type in [place] in the search bar and download the app. The app is free but as with any other app downloads, their phone may require them to verify saved payment information or Apple/Android  password.  - The patient will need to then log into the app with their MyChart username and password, and select [facility] as their healthcare provider to link the account. When it is time for your visit, go to the MyChart app, find appointments, and click Begin Video Visit. Be sure to Select Allow for your device to access the Microphone and Camera for your visit. You will then be connected, and your provider will be with you shortly.  **If they have any issues connecting, or need assistance please contact MyChart service desk [phone])**  **If using a computer, in order to ensure the best quality for their visit they will need to use either of the following Internet Browsers: [employer], or Google Chrome**  IF USING DOXIMITY or DOXY.ME - The patient will receive a link just prior to their visit by text.     FULL LENGTH CONSENT FOR TELE-HEALTH VISIT   I hereby voluntarily request, consent and authorize [facility] and its employed or contracted physicians, physician assistants, nurse practitioners or other licensed health care professionals (the Practitioner), to provide me with telemedicine health care services (the "Services") as deemed necessary by the treating Practitioner. I acknowledge and consent to receive the Services by the Practitioner via telemedicine. I understand that the telemedicine visit will involve communicating with the Practitioner through live audiovisual communication technology and the disclosure of certain medical information by electronic transmission. I acknowledge that I have been given the opportunity to request an in-person assessment or other available alternative prior to the telemedicine visit and am voluntarily participating in the telemedicine visit.  I understand that I have the right to withhold or withdraw my consent to the use of telemedicine in the course of my care at any time, without affecting my right to future care or treatment,  and that the Practitioner or I may terminate the telemedicine visit at any time. I understand that I have the right to inspect all information obtained and/or recorded in the course of the telemedicine visit and may receive copies of available information for a reasonable fee.  I understand that some of the potential risks of receiving the Services via telemedicine include:  Marland Kitchen Delay or interruption in medical evaluation due to technological equipment failure or disruption; . Information transmitted may not be sufficient (e.g. poor resolution of images) to allow for appropriate medical decision making by the Practitioner; and/or  . In rare instances, security protocols could fail, causing a breach of personal health information.  Furthermore, I acknowledge that it is my responsibility to provide information about my medical history, conditions and care that is complete and accurate to the best of my ability. I acknowledge that Practitioner's advice, recommendations, and/or decision may be based on factors not within their control, such as incomplete or inaccurate data provided by me or distortions of diagnostic images or specimens that may result from electronic transmissions. I  understand that the practice of medicine is not an exact science and that Practitioner makes no warranties or guarantees regarding treatment outcomes. I acknowledge that I will receive a copy of this consent concurrently upon execution via email to the email address I last provided but may also request a printed copy by calling the office of [facility].    I understand that my insurance will be billed for this visit.   I have read or had this consent read to me. . I understand the contents of this consent, which adequately explains the benefits and risks of the Services being provided via telemedicine.  . I have been provided ample opportunity to ask questions regarding this consent and the Services and have had my questions  answered to my satisfaction. . I give my informed consent for the services to be provided through the use of telemedicine in my medical care  By participating in this telemedicine visit I agree to the above.

## 2019-01-10 NOTE — Telephone Encounter (Signed)
Has a video appt this PM with me. Please make sure they have weighed her today, have BP and hopefully we can do a video conference if they have a smart phone. MCr

## 2019-01-11 ENCOUNTER — Telehealth (INDEPENDENT_AMBULATORY_CARE_PROVIDER_SITE_OTHER): Payer: Medicare Other | Admitting: Cardiovascular Disease

## 2019-01-11 ENCOUNTER — Encounter: Payer: Self-pay | Admitting: Cardiovascular Disease

## 2019-01-11 ENCOUNTER — Telehealth: Payer: Medicare Other | Admitting: Cardiovascular Disease

## 2019-01-11 VITALS — BP 128/79 | HR 73 | Ht 61.0 in | Wt 137.6 lb

## 2019-01-11 DIAGNOSIS — N183 Chronic kidney disease, stage 3 unspecified: Secondary | ICD-10-CM

## 2019-01-11 DIAGNOSIS — I110 Hypertensive heart disease with heart failure: Secondary | ICD-10-CM

## 2019-01-11 DIAGNOSIS — I5033 Acute on chronic diastolic (congestive) heart failure: Secondary | ICD-10-CM

## 2019-01-11 DIAGNOSIS — Z952 Presence of prosthetic heart valve: Secondary | ICD-10-CM

## 2019-01-11 DIAGNOSIS — I10 Essential (primary) hypertension: Secondary | ICD-10-CM | POA: Diagnosis not present

## 2019-01-11 DIAGNOSIS — Z955 Presence of coronary angioplasty implant and graft: Secondary | ICD-10-CM

## 2019-01-11 DIAGNOSIS — Z950 Presence of cardiac pacemaker: Secondary | ICD-10-CM

## 2019-01-11 DIAGNOSIS — I4811 Longstanding persistent atrial fibrillation: Secondary | ICD-10-CM

## 2019-01-11 DIAGNOSIS — I495 Sick sinus syndrome: Secondary | ICD-10-CM

## 2019-01-11 DIAGNOSIS — I2510 Atherosclerotic heart disease of native coronary artery without angina pectoris: Secondary | ICD-10-CM | POA: Diagnosis not present

## 2019-01-11 DIAGNOSIS — E7800 Pure hypercholesterolemia, unspecified: Secondary | ICD-10-CM

## 2019-01-11 DIAGNOSIS — I2721 Secondary pulmonary arterial hypertension: Secondary | ICD-10-CM

## 2019-01-11 DIAGNOSIS — I071 Rheumatic tricuspid insufficiency: Secondary | ICD-10-CM

## 2019-01-11 MED ORDER — METOLAZONE 2.5 MG PO TABS: ORAL_TABLET | ORAL | 3 refills | Status: DC

## 2019-01-11 NOTE — Patient Instructions (Addendum)
Please do a pacemaker download today.  We have not received a download since October 2019.  If this does not go through can ask a [employer] pacemaker representative to get in touch with you, to help troubleshoot the problem.  Please pay extra attention to the salt content of your foods.  On the package there will be a report of the sodium content in milligrams and then percent of recommended daily allowance.  Do not use ingredients that have more than 10% of your daily recommended sodium allowance.  Avoid canned foods, deli products, processed foods which all tends to have high salt content.  You will receive a new prescription for metolazone 2.5 mg tablets.  This is a "booster" to help with fluid removal.  You should take it 30 minutes before you take your morning dose of furosemide.  You should only take this occasionally, as needed (not as a daily prescription).  Continue taking furosemide 80 mg every morning and 40 mg every afternoon, potassium supplement (KCl 20 mEq) 2 tablets every morning and 1 tablet every afternoon.  Please continue to weigh daily, every morning and keep a log.  Please call us with your weight this Friday, April 24.  Will schedule labs (basic metabolic panel, magnesium) when you come in for your prothrombin time check on May 6.  Follow up appointment scheduled with Dr.Hogan Hoobler Friday 04/28/19 at 9:20 am

## 2019-01-11 NOTE — Progress Notes (Signed)
Virtual Visit via Telephone Note   This visit type was conducted due to national recommendations for restrictions regarding the COVID-19 Pandemic (e.g. social distancing) in an effort to limit this patient's exposure and mitigate transmission in our community.  Due to her co-morbid illnesses, this patient is at least at moderate risk for complications without adequate follow up.  This format is felt to be most appropriate for this patient at this time.  The patient did not have access to video technology/had technical difficulties with video requiring transitioning to audio format only (telephone).  All issues noted in this document were discussed and addressed.  No physical exam could be performed with this format.  Please refer to the patient's chart for her  consent to telehealth for [facility].   Two different video apps were tried unsuccessfully  Evaluation Performed:  Follow-up visit  Date:  01/11/2019   ID:  Carrie [name], DOB Apr 07, 1941, MRN [id number]  Patient Location: Home Provider Location: Home  PCP:  Lucianne Lei, MD  Cardiologist:  [name], MD  Electrophysiologist:  None   Chief Complaint:  Edema  History of Present Illness:    Carrie [name] is a 78 y.o. 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  She is developed substantial edema of the lower extremities.  The back of her legs are so swollen that it makes it difficult to climb stairs.  She's gained  12 pounds above her "dry weight" (estimated at about 125 pounds)..  Her abdomen feels tight.  She is relatively sedentary and does not complain of dyspnea.  She does not have orthopnea.  She is compliant with furosemide 80 mg in the morning and 40 mg in the afternoon and also takes 40 mg of potassium in the morning and 20 mEq in the afternoon.  She has not had any problems of dizziness or lightheadedness and has not experienced falls.  She does not have palpitations or syncope.  She has not had any recent bleeding problems.  She denies angina pectoris.  She is not aware of any particular salt indiscretion that would have led to the fluid gain.  However, going over diet in detail, we discovered frequent opportunities to improve sodium restriction.  We have not received any downloads from her pacemaker since last fall.  She needs an updated transmitter and has not received it yet.  The patient does not have symptoms concerning for COVID-19 infection (fever, chills, cough, or new shortness of breath).   Surprisingly, at her 04/21/2017 appointment she was in sinus rhythm, which had not been documented in a long time, but 05/11/2017 her rhythm was clearly irregular again and at all subsequent visits she has been in atrial fibrillation with intermittent ventricular pacing.  She has a single-chamber Medtronic Price single chamber pacemaker (implant 2008, generator change 2015) set at lower rate limit of 50 bpm, to try to limit the frequency of ventricular pacing, since this seems to worsen heart failure.  In December 2012 she had subarachnoid hemorrhage and in June 2013 she had intrahepatic hemorrhage requiring arterial embolization and we are  keeping her INR in the lower range (2.0-2.5). She has normal left ventricular systolic function by echocardiography (July 2019).  She has severe tricuspid insufficiency. She had a small non-ST segment elevation myocardial infarction treated with placement of a  drug-eluting (Resolute Onyx 3.5 x 12) stent in the ostium of the right coronary artery on 09/30/2016.  Past Medical History:  Diagnosis Date   Anticoagulated on Coumadin    for mech valve and atrial fib  goal 2.0-2.5   Anxiety    Arthritis    "right shoulder" (03/15/2013)   Atrial fibrillation (HCC)    Atrial flutter (HCC)    CAD (coronary artery disease) 08/11/2016   CHF (congestive heart failure) (HCC)    Chronic combined systolic and diastolic CHF (congestive heart failure) ([place])    a. 02/2016 Echo: EF 45-50%.   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 Bi-leaflet St. Jude mechanical prosthesis; b. 02/2016 Echo: EF 45-50%, some degree of MR, sev dil LA/RA, sev TR.   NSTEMI (non-ST elevated myocardial infarction) ([place])    a. 02/2016 elev trop/Cath: nonobs dzs,    Pacemaker    medtronic adapta   Pneumonia 2009   resolved.? OPD Rx   Severe tricuspid regurgitation    a. 02/2016 Echo: Ef 45-50%, sev TR, PASP 78mmHg.   Sick sinus syndrome (HCC)    Dr Cristopher Peru. EP study negative. Pacemaker 12/15/06 Medtronic   Stroke [facility])    "they say I had a stroke last year" denies residual on 03/15/2013   Subdural hematoma [facility])    Past Surgical History:  Procedure Laterality Date   APPENDECTOMY     CARDIAC CATHETERIZATION  04/02/97   R&L:severe MR/pulmonary hypertension   CARDIAC CATHETERIZATION N/A 03/16/2016   Procedure: Left Heart Cath and Coronary Angiography;  Surgeon: [name], MD;  Location: [place] CV LAB;  Service: Cardiovascular;  Laterality: N/A;   CARDIAC CATHETERIZATION N/A 09/30/2016   Procedure: Left Heart Cath and Coronary Angiography;  Surgeon:  [name], MD;  Location: [place] CV LAB;  Service: Cardiovascular;  Laterality: N/A;   CARDIAC CATHETERIZATION N/A 09/30/2016   Procedure: Coronary Stent Intervention;  Surgeon: [name], MD;  3.5 x 12 mm resolute Onyx  to ostial RCA   CATARACT EXTRACTION W/ INTRAOCULAR LENS  IMPLANT, BILATERAL Bilateral 2013   CORONARY ANGIOPLASTY     DILATION AND CURETTAGE OF UTERUS     "had fibroids" (03/15/2013)   EXPLORATORY LAPAROTOMY     "had a growth on my intestines" (03/15/2013)   HAMMER TOE SURGERY Right    INSERT / REPLACE / REMOVE PACEMAKER  12/15/2006   Medtronic   MITRAL VALVE REPLACEMENT  1998   St Jude mechanical; Dr. [name]   PACEMAKER PLACEMENT  12/15/06   medtronic adapta for SSS   PERMANENT PACEMAKER GENERATOR CHANGE N/A 07/24/2014   Procedure: PERMANENT PACEMAKER GENERATOR CHANGE;  Surgeon: [name], MD;  Location: [place] CATH [facility];  Service: Cardiovascular;  Laterality: N/A;   PERSANTINE CARDIOLITE  08/07/03   mild inf. ischemia    TEE WITHOUT CARDIOVERSION  09/23/2011   Procedure: TRANSESOPHAGEAL ECHOCARDIOGRAM (TEE);  Surgeon: [name];  Location:  [facility];  Service: Cardiovascular;  Laterality: N/A;   TONSILLECTOMY     TUBAL LIGATION     US ECHOCARDIOGRAPHY  11/19/2011   EF 50-55%,RA mod to severely dilated,LA severely dilated,trace MR,small vegetation or mass on the MV,AOV mildly scleroticmild PI, RV pressure 40-42mmHg     Current Meds  Medication Sig   atorvastatin (LIPITOR) 40 MG tablet TAKE 1 TABLET BY MOUTH EVERY DAY AT 6PM   Biotin 1000 MCG tablet Take 1,000 mcg by mouth daily.   dextromethorphan-guaiFENesin (MUCINEX DM) 30-600 MG 12hr tablet Take 1 tablet by mouth 2 (two) times daily as needed for cough.   diltiazem (CARDIZEM CD) 120 MG 24 hr capsule TAKE ONE CAPSULE BY MOUTH DAILY   docusate sodium (COLACE) 100 MG capsule Take 200 mg by mouth 2 (two) times daily.    furosemide (LASIX) 40 MG tablet Take 40 mg by mouth 2  (two) times daily.   metoprolol tartrate (LOPRESSOR) 25 MG tablet Take 1 tablet (25 mg total) by mouth 2 (two) times daily.   Multiple Vitamin (MULTIVITAMINS PO) Take 1 tablet by mouth daily.    nitroGLYCERIN (NITROSTAT) 0.4 MG SL tablet Place 1 tablet (0.4 mg total) under the tongue every 5 (five) minutes as needed for chest pain.   Omega-3 Fatty Acids (FISH OIL) 1000 MG CAPS Take 1,000 mg by mouth daily.   polyethylene glycol (MIRALAX / GLYCOLAX) packet Take 17 g by mouth as needed (for constipation). Reported on 03/25/2016   potassium chloride SA (K-DUR,KLOR-CON) 20 MEQ tablet Take 2 tablets (40 mEq total) by mouth every morning AND 1 tablet (20 mEq total) every evening.   warfarin (COUMADIN) 5 MG tablet TAKE 1 TO 1 AND 1/2 TABLETS BY MOUTH DAILY AS DIRECTED     Allergies:   Codeine   Social History   Tobacco Use   Smoking status: Never Smoker   Smokeless tobacco: Never Used  Substance Use Topics   Alcohol use: No   Drug use: No     Family Hx: The patient's family history includes Cancer in her mother and sister; Diabetes in her sister; Healthy in her brother, brother, brother, and sister; Heart attack in her maternal grandfather; Kidney disease in her daughter; Leukemia in her sister; Stroke in her brother and father.  ROS:   Please see the history of present illness.     All other systems reviewed and are negative.   Prior CV studies:   The following studies were reviewed today:  Most recent pacemaker download October 2019 and echocardiogram July 2019  Labs/Other Tests and Data Reviewed:    EKG:  An ECG dated 03/31/2018 was personally reviewed today and demonstrated:  Atrial fibrillation with controlled ventricular response, rare ventricular paced beats, nonspecific intraventricular conduction delay with QRS of 140 ms.  Recent Labs: 03/30/2018: B Natriuretic Peptide 1,780.8; Hemoglobin 9.0; Platelets 212 06/06/2018: ALT 23; BUN 33; Creatinine, Ser 1.19; Potassium  4.4; Sodium 137   Recent Lipid Panel Lab Results  Component Value Date/Time   CHOL 87 03/31/2018 01:12 AM   TRIG 46 03/31/2018 01:12 AM   HDL 31 (L) 03/31/2018 01:12 AM   CHOLHDL 2.8 03/31/2018 01:12 AM   LDLCALC 47 03/31/2018 01:12 AM    Wt Readings from Last 3 Encounters:  01/11/19 137 lb 9.6 oz (62.4 kg)  07/07/18 129 lb 6.4 oz (58.7 kg)  06/01/18 133 lb 12.8 oz (60.7 kg)     Objective:    Vital Signs:  BP 128/79    Pulse  73    Ht 5\' 1"  (1.549 m)    Wt 137 lb 9.6 oz (62.4 kg)    SpO2 99%    BMI 26.00 kg/m    VITAL SIGNS:  reviewed Unable to examine  ASSESSMENT & PLAN:    1. CHF exacerbation: Preserved left ventricular systolic function.  She has substantial hypervolemia, as before mostly manifesting as right heart failure.  Furosemide is not really helping much despite the increased dose.  We will add a one-time dose of metolazone and touch base again on Friday, April 24 to see how this has worked.  Reinforced the importance of daily weight monitoring.  Discussed sodium restriction in detail. 2. Rt heart failure: Consequence of fixed pulmonary hypertension, likely due to longstanding rheumatic mitral valve disease.  Compounded by severe tricuspid regurgitation. 3. PAH:   substantial pulmonary hypertension persisted even when well diuresed that last cardiac catheterization.  This is consistent with fixed arteriolar disease. 4. TR: related to her pacemaker leads and possible rheumatic tricuspid disease as well.  Not a good candidate for repeat sternotomy and valve repair. 5. S/P mechanical MVR: Need to get a download on her pacemaker.  For atrial fibrillation rate control will be associated with worsening transvalvular gradients and may explain deterioration as well. 6. AFib: Obviously rapid rates could lead to heart failure exacerbation.  However, also need to avoid excessive AV nodal blocking, which has led to increased ventricular pacing, which also appeared to cause heart  failure in the past. 7. Warfarin: She has had serious bleeding complications before (intracranial hemorrhage, intrahepatic bleeding).  We were trying to keep her INR 2.0-2.5.  Noted the potential increased effects of warfarin in the setting of hepatic congestion and the need to monitor her prothrombin time carefully.  Most recent INR was 3.7, Scheduled for repeat INR May 6. 8. CAD: History of non-STEMI and drug-eluting stent to the right coronary artery about 2 years ago.  No current angina, no other manifestations suggestive of acute coronary insufficiency at this time, but this needs to be kept in mind if the heart failure is difficult to treat. 9. HLP: On statin 10. PM: S/P pacemaker representative to try expediting shipping of her new transmitter.   COVID-19 Education: The signs and symptoms of COVID-19 were discussed with the patient and how to seek care for testing (follow up with PCP or arrange E-visit).  The importance of social distancing was discussed today.  Time:   Today, I have spent 32 minutes with the patient with telehealth technology discussing the above problems.     Medication Adjustments/Labs and Tests Ordered: Current medicines are reviewed at length with the patient today.  Concerns regarding medicines are outlined above.   Tests Ordered: No orders of the defined types were placed in this encounter.   Medication Changes: No orders of the defined types were placed in this encounter.   Disposition:  Follow up 04/28/2019  Signed, [name], MD  01/11/2019 3:05 PM    [place]

## 2019-01-12 ENCOUNTER — Other Ambulatory Visit: Payer: Self-pay

## 2019-01-12 MED ORDER — METOLAZONE 2.5 MG PO TABS: ORAL_TABLET | ORAL | 3 refills | Status: DC

## 2019-01-13 ENCOUNTER — Telehealth: Payer: Self-pay

## 2019-01-13 ENCOUNTER — Other Ambulatory Visit: Payer: Self-pay

## 2019-01-13 MED ORDER — METOLAZONE 2.5 MG PO TABS: ORAL_TABLET | ORAL | 3 refills | Status: DC

## 2019-01-13 NOTE — Telephone Encounter (Signed)
Received a refill request for ""90 day supply" for metolazone 2.5mg  per virtual visit Dr Sallyanne Kuster 01-11-2019:  We will add a one-time dose of metolazone and touch base again on Friday, April 24 to see how this has worked. Lasix dosine:She is compliant with furosemide 80 mg in the morning and 40 mg in the afternoon and also takes 40 mg of potassium in the morning and 20 mEq in the afternoon.  Called pt she states that her swelling is no better today than the day of the appointment her weight today is 137# she states that she believes that the weight is up 1# from the 22nd. She is taking lasix 80mg -AM and 40mg -PM and potassium 79mEq-am and 9mEq=pm as ordered She states that she needs another dose of the metolazone. Informed pt I would send to Dr C for further direction.

## 2019-01-14 ENCOUNTER — Telehealth: Payer: Self-pay | Admitting: Cardiology

## 2019-01-14 NOTE — Telephone Encounter (Signed)
Patient called in following up from recent virtual visit this week. Appears from notes she was given metolazone after her visit this week as weight was up and swelling. Reports she took a dose on Thursday but not a lot of significant output. Reports legs seem to be slightly better but weight is up 1 lb today compared to yesterday, 138lbs. Making urine but not as much as she thinks she should. I advised she could take an additional metolazone 2.5mg  tablet today and follow output/weight. She denies any significant breathing issues, mostly just leg swelling. Given ED precautions and advised to call back if symptoms are not improving or weight not responding. Patient understood and thanked me for the follow up call.   Reino Bellis

## 2019-01-16 NOTE — Telephone Encounter (Signed)
Returned call to pt she states that she is much better today. She states that she has been taking her lasix as directed and has only taken her metolazone Thursday and Sunday. She states that she is feeling much better and her swelling is down and is weighing 136# this morning. She denies any SOB or any other sx. She will continue with the medications as ordered.

## 2019-01-16 NOTE — Telephone Encounter (Signed)
She was supposed to receive a Rx for 10 tabs of metolazone which were to be taken as needed, with 3 refills. Do not refill a 90 day supply.

## 2019-01-23 ENCOUNTER — Telehealth: Payer: Self-pay

## 2019-01-23 NOTE — Telephone Encounter (Signed)

## 2019-01-25 ENCOUNTER — Other Ambulatory Visit: Payer: Self-pay

## 2019-01-25 ENCOUNTER — Ambulatory Visit (INDEPENDENT_AMBULATORY_CARE_PROVIDER_SITE_OTHER): Payer: Medicare Other

## 2019-01-25 ENCOUNTER — Other Ambulatory Visit: Payer: Self-pay | Admitting: *Deleted

## 2019-01-25 DIAGNOSIS — Z952 Presence of prosthetic heart valve: Secondary | ICD-10-CM | POA: Diagnosis not present

## 2019-01-25 DIAGNOSIS — I10 Essential (primary) hypertension: Secondary | ICD-10-CM | POA: Diagnosis not present

## 2019-01-25 DIAGNOSIS — N183 Chronic kidney disease, stage 3 unspecified: Secondary | ICD-10-CM

## 2019-01-25 DIAGNOSIS — I5032 Chronic diastolic (congestive) heart failure: Secondary | ICD-10-CM

## 2019-01-25 DIAGNOSIS — Z7901 Long term (current) use of anticoagulants: Secondary | ICD-10-CM | POA: Diagnosis not present

## 2019-01-25 LAB — POCT INR: INR: 2.8 (ref 2.0–3.0)

## 2019-01-25 NOTE — Patient Instructions (Signed)
Description   Called spoke with pt, advised to take 1/2 tablet today, then start taking 1 tablet daily, except 1/2 tablet on Mondays and Thursdays. Recheck INR in 3 weeks.

## 2019-01-26 ENCOUNTER — Telehealth: Payer: Self-pay | Admitting: *Deleted

## 2019-01-26 DIAGNOSIS — N183 Chronic kidney disease, stage 3 unspecified: Secondary | ICD-10-CM

## 2019-01-26 DIAGNOSIS — I10 Essential (primary) hypertension: Secondary | ICD-10-CM

## 2019-01-26 DIAGNOSIS — I5032 Chronic diastolic (congestive) heart failure: Secondary | ICD-10-CM

## 2019-01-26 LAB — BASIC METABOLIC PANEL
BUN/Creatinine Ratio: 27 (ref 12–28)
BUN: 38 mg/dL — ABNORMAL HIGH (ref 8–27)
CO2: 19 mmol/L — ABNORMAL LOW (ref 20–29)
Calcium: 9.5 mg/dL (ref 8.7–10.3)
Chloride: 106 mmol/L (ref 96–106)
Creatinine, Ser: 1.39 mg/dL — ABNORMAL HIGH (ref 0.57–1.00)
GFR calc Af Amer: 42 mL/min/{1.73_m2} — ABNORMAL LOW (ref >59)
GFR calc non Af Amer: 37 mL/min/{1.73_m2} — ABNORMAL LOW (ref >59)
Glucose: 95 mg/dL (ref 65–99)
Potassium: 4.5 mmol/L (ref 3.5–5.2)
Sodium: 142 mmol/L (ref 134–144)

## 2019-01-26 LAB — MAGNESIUM: Magnesium: 2.7 mg/dL — ABNORMAL HIGH (ref 1.6–2.3)

## 2019-01-26 NOTE — Telephone Encounter (Signed)
-----   Message from [name], MD sent at 01/26/2019 12:29 PM EDT ----- Slight worsening of renal function (expected with increased diuretics), but potassium is OK. Magnesium still high, please confirm that she is not on Mag supplements or Mg based laxatives. Recheck BMET and Mg in 1 month please

## 2019-01-26 NOTE — Telephone Encounter (Signed)
Patient made aware of results and verbalized understanding. She stated that she does take Miralax on a regular basis.  Repeat lab orders have been placed.

## 2019-01-27 NOTE — Telephone Encounter (Signed)
Miralax OK, but avoid mylanta, maalox, milk of magnesia, magnesium citrate

## 2019-01-31 NOTE — Telephone Encounter (Signed)
Patient has been made aware and verbalized her understanding.  

## 2019-02-02 ENCOUNTER — Encounter (HOSPITAL_COMMUNITY): Payer: Self-pay | Admitting: Emergency Medicine

## 2019-02-02 ENCOUNTER — Inpatient Hospital Stay (HOSPITAL_COMMUNITY)
Admission: EM | Admit: 2019-02-02 | Discharge: 2019-02-07 | DRG: 286 | Disposition: A | Discharge status: Home Health | Payer: Medicare Other | Attending: Internal Medicine | Admitting: Internal Medicine

## 2019-02-02 ENCOUNTER — Other Ambulatory Visit: Payer: Self-pay

## 2019-02-02 ENCOUNTER — Emergency Department (HOSPITAL_COMMUNITY): Payer: Medicare Other

## 2019-02-02 DIAGNOSIS — Z806 Family history of leukemia: Secondary | ICD-10-CM

## 2019-02-02 DIAGNOSIS — Z833 Family history of diabetes mellitus: Secondary | ICD-10-CM

## 2019-02-02 DIAGNOSIS — Z95810 Presence of automatic (implantable) cardiac defibrillator: Secondary | ICD-10-CM | POA: Diagnosis not present

## 2019-02-02 DIAGNOSIS — I495 Sick sinus syndrome: Secondary | ICD-10-CM | POA: Diagnosis not present

## 2019-02-02 DIAGNOSIS — R269 Unspecified abnormalities of gait and mobility: Secondary | ICD-10-CM | POA: Diagnosis not present

## 2019-02-02 DIAGNOSIS — N183 Chronic kidney disease, stage 3 (moderate): Secondary | ICD-10-CM | POA: Diagnosis present

## 2019-02-02 DIAGNOSIS — Z9889 Other specified postprocedural states: Secondary | ICD-10-CM | POA: Diagnosis not present

## 2019-02-02 DIAGNOSIS — I2721 Secondary pulmonary arterial hypertension: Secondary | ICD-10-CM | POA: Diagnosis not present

## 2019-02-02 DIAGNOSIS — R0602 Shortness of breath: Secondary | ICD-10-CM | POA: Diagnosis present

## 2019-02-02 DIAGNOSIS — Z955 Presence of coronary angioplasty implant and graft: Secondary | ICD-10-CM

## 2019-02-02 DIAGNOSIS — I272 Pulmonary hypertension, unspecified: Secondary | ICD-10-CM | POA: Diagnosis not present

## 2019-02-02 DIAGNOSIS — Z9841 Cataract extraction status, right eye: Secondary | ICD-10-CM

## 2019-02-02 DIAGNOSIS — Z20828 Contact with and (suspected) exposure to other viral communicable diseases: Secondary | ICD-10-CM | POA: Diagnosis present

## 2019-02-02 DIAGNOSIS — I5082 Biventricular heart failure: Secondary | ICD-10-CM | POA: Diagnosis not present

## 2019-02-02 DIAGNOSIS — N179 Acute kidney failure, unspecified: Secondary | ICD-10-CM | POA: Diagnosis present

## 2019-02-02 DIAGNOSIS — Z961 Presence of intraocular lens: Secondary | ICD-10-CM | POA: Diagnosis present

## 2019-02-02 DIAGNOSIS — I50812 Chronic right heart failure: Secondary | ICD-10-CM

## 2019-02-02 DIAGNOSIS — Z952 Presence of prosthetic heart valve: Secondary | ICD-10-CM | POA: Diagnosis not present

## 2019-02-02 DIAGNOSIS — I110 Hypertensive heart disease with heart failure: Secondary | ICD-10-CM | POA: Diagnosis not present

## 2019-02-02 DIAGNOSIS — I130 Hypertensive heart and chronic kidney disease with heart failure and stage 1 through stage 4 chronic kidney disease, or unspecified chronic kidney disease: Principal | ICD-10-CM | POA: Diagnosis present

## 2019-02-02 DIAGNOSIS — I4821 Permanent atrial fibrillation: Secondary | ICD-10-CM | POA: Diagnosis not present

## 2019-02-02 DIAGNOSIS — Z885 Allergy status to narcotic agent status: Secondary | ICD-10-CM

## 2019-02-02 DIAGNOSIS — I340 Nonrheumatic mitral (valve) insufficiency: Secondary | ICD-10-CM | POA: Diagnosis not present

## 2019-02-02 DIAGNOSIS — E876 Hypokalemia: Secondary | ICD-10-CM | POA: Diagnosis present

## 2019-02-02 DIAGNOSIS — R040 Epistaxis: Secondary | ICD-10-CM | POA: Diagnosis present

## 2019-02-02 DIAGNOSIS — R718 Other abnormality of red blood cells: Secondary | ICD-10-CM | POA: Diagnosis not present

## 2019-02-02 DIAGNOSIS — I2729 Other secondary pulmonary hypertension: Secondary | ICD-10-CM | POA: Diagnosis present

## 2019-02-02 DIAGNOSIS — Z8673 Personal history of transient ischemic attack (TIA), and cerebral infarction without residual deficits: Secondary | ICD-10-CM | POA: Diagnosis not present

## 2019-02-02 DIAGNOSIS — I2510 Atherosclerotic heart disease of native coronary artery without angina pectoris: Secondary | ICD-10-CM | POA: Diagnosis present

## 2019-02-02 DIAGNOSIS — J9601 Acute respiratory failure with hypoxia: Secondary | ICD-10-CM | POA: Diagnosis present

## 2019-02-02 DIAGNOSIS — Z79899 Other long term (current) drug therapy: Secondary | ICD-10-CM

## 2019-02-02 DIAGNOSIS — Z9842 Cataract extraction status, left eye: Secondary | ICD-10-CM

## 2019-02-02 DIAGNOSIS — R5381 Other malaise: Secondary | ICD-10-CM | POA: Diagnosis not present

## 2019-02-02 DIAGNOSIS — I482 Chronic atrial fibrillation, unspecified: Secondary | ICD-10-CM | POA: Diagnosis present

## 2019-02-02 DIAGNOSIS — M109 Gout, unspecified: Secondary | ICD-10-CM | POA: Diagnosis present

## 2019-02-02 DIAGNOSIS — Z7901 Long term (current) use of anticoagulants: Secondary | ICD-10-CM

## 2019-02-02 DIAGNOSIS — I5033 Acute on chronic diastolic (congestive) heart failure: Secondary | ICD-10-CM | POA: Diagnosis not present

## 2019-02-02 DIAGNOSIS — R7989 Other specified abnormal findings of blood chemistry: Secondary | ICD-10-CM | POA: Diagnosis present

## 2019-02-02 DIAGNOSIS — I509 Heart failure, unspecified: Secondary | ICD-10-CM

## 2019-02-02 DIAGNOSIS — I50813 Acute on chronic right heart failure: Secondary | ICD-10-CM | POA: Diagnosis not present

## 2019-02-02 DIAGNOSIS — K219 Gastro-esophageal reflux disease without esophagitis: Secondary | ICD-10-CM | POA: Diagnosis present

## 2019-02-02 DIAGNOSIS — Z8249 Family history of ischemic heart disease and other diseases of the circulatory system: Secondary | ICD-10-CM

## 2019-02-02 DIAGNOSIS — I5043 Acute on chronic combined systolic (congestive) and diastolic (congestive) heart failure: Secondary | ICD-10-CM | POA: Diagnosis not present

## 2019-02-02 DIAGNOSIS — Z823 Family history of stroke: Secondary | ICD-10-CM

## 2019-02-02 DIAGNOSIS — I252 Old myocardial infarction: Secondary | ICD-10-CM | POA: Diagnosis not present

## 2019-02-02 DIAGNOSIS — E785 Hyperlipidemia, unspecified: Secondary | ICD-10-CM | POA: Diagnosis present

## 2019-02-02 LAB — CBC WITH DIFFERENTIAL/PLATELET
Abs Immature Granulocytes: 0.01 10*3/uL (ref 0.00–0.07)
Basophils Absolute: 0 10*3/uL (ref 0.0–0.1)
Basophils Relative: 1 %
Eosinophils Absolute: 0.1 10*3/uL (ref 0.0–0.5)
Eosinophils Relative: 3 %
HCT: 28.4 % — ABNORMAL LOW (ref 36.0–46.0)
Hemoglobin: 9.2 g/dL — ABNORMAL LOW (ref 12.0–15.0)
Immature Granulocytes: 0 %
Lymphocytes Relative: 16 %
Lymphs Abs: 0.6 10*3/uL — ABNORMAL LOW (ref 0.7–4.0)
MCH: 28.7 pg (ref 26.0–34.0)
MCHC: 32.4 g/dL (ref 30.0–36.0)
MCV: 88.5 fL (ref 80.0–100.0)
Monocytes Absolute: 0.8 10*3/uL (ref 0.1–1.0)
Monocytes Relative: 21 %
Neutro Abs: 2.3 10*3/uL (ref 1.7–7.7)
Neutrophils Relative %: 59 %
Platelets: 153 10*3/uL (ref 150–400)
RBC: 3.21 MIL/uL — ABNORMAL LOW (ref 3.87–5.11)
RDW: 27.3 % — ABNORMAL HIGH (ref 11.5–15.5)
WBC: 3.9 10*3/uL — ABNORMAL LOW (ref 4.0–10.5)
nRBC: 1 % — ABNORMAL HIGH (ref 0.0–0.2)

## 2019-02-02 LAB — SARS CORONAVIRUS 2 BY RT PCR (HOSPITAL ORDER, PERFORMED IN ~~LOC~~ HOSPITAL LAB): SARS Coronavirus 2: NEGATIVE

## 2019-02-02 LAB — BASIC METABOLIC PANEL
Anion gap: 9 (ref 5–15)
BUN: 39 mg/dL — ABNORMAL HIGH (ref 8–23)
CO2: 23 mmol/L (ref 22–32)
Calcium: 9.4 mg/dL (ref 8.9–10.3)
Chloride: 105 mmol/L (ref 98–111)
Creatinine, Ser: 1.42 mg/dL — ABNORMAL HIGH (ref 0.44–1.00)
GFR calc Af Amer: 41 mL/min — ABNORMAL LOW (ref >60)
GFR calc non Af Amer: 36 mL/min — ABNORMAL LOW (ref >60)
Glucose, Bld: 106 mg/dL — ABNORMAL HIGH (ref 70–99)
Potassium: 4.5 mmol/L (ref 3.5–5.1)
Sodium: 137 mmol/L (ref 135–145)

## 2019-02-02 LAB — PROTIME-INR
INR: 2.2 — ABNORMAL HIGH (ref 0.8–1.2)
Prothrombin Time: 24.5 seconds — ABNORMAL HIGH (ref 11.4–15.2)

## 2019-02-02 LAB — BRAIN NATRIURETIC PEPTIDE: B Natriuretic Peptide: 2329.2 pg/mL — ABNORMAL HIGH (ref 0.0–100.0)

## 2019-02-02 LAB — TROPONIN I: Troponin I: 0.05 ng/mL (ref <0.03)

## 2019-02-02 MED ORDER — ATORVASTATIN CALCIUM 40 MG PO TABS
40.0000 mg | ORAL_TABLET | Freq: Every day | ORAL | Status: DC
  Administered 2019-02-03 – 2019-02-06 (×5): 40 mg via ORAL
  Filled 2019-02-02 (×5): qty 1

## 2019-02-02 MED ORDER — POLYETHYLENE GLYCOL 3350 17 G PO PACK
17.0000 g | PACK | ORAL | Status: DC | PRN
  Filled 2019-02-02: qty 1

## 2019-02-02 MED ORDER — FUROSEMIDE 10 MG/ML IJ SOLN
60.0000 mg | Freq: Two times a day (BID) | INTRAMUSCULAR | Status: DC
  Administered 2019-02-03 (×2): 60 mg via INTRAVENOUS
  Filled 2019-02-02 (×2): qty 6

## 2019-02-02 MED ORDER — FUROSEMIDE 10 MG/ML IJ SOLN
40.0000 mg | Freq: Once | INTRAMUSCULAR | Status: AC
  Administered 2019-02-02: 40 mg via INTRAVENOUS
  Filled 2019-02-02: qty 4

## 2019-02-02 MED ORDER — DOCUSATE SODIUM 100 MG PO CAPS
200.0000 mg | ORAL_CAPSULE | Freq: Two times a day (BID) | ORAL | Status: DC
  Administered 2019-02-03 – 2019-02-07 (×10): 200 mg via ORAL
  Filled 2019-02-02 (×10): qty 2

## 2019-02-02 MED ORDER — FUROSEMIDE 10 MG/ML IJ SOLN
20.0000 mg | Freq: Once | INTRAMUSCULAR | Status: AC
  Administered 2019-02-02: 20 mg via INTRAVENOUS
  Filled 2019-02-02: qty 2

## 2019-02-02 MED ORDER — METOPROLOL TARTRATE 25 MG PO TABS
25.0000 mg | ORAL_TABLET | Freq: Two times a day (BID) | ORAL | Status: DC
  Administered 2019-02-03 – 2019-02-07 (×9): 25 mg via ORAL
  Filled 2019-02-02 (×10): qty 1

## 2019-02-02 NOTE — Progress Notes (Signed)
Patient states she takes a few meds at night, paged MD for orders.

## 2019-02-02 NOTE — ED Notes (Signed)
This RN acting as Art therapist and asked pt if she would like for me to reach out to any family/friend. Pt requested that her sister in law, Mel Almond be called. I spoke with Mel Almond ([phone]) and informed her on pt's care this far. Mel Almond states she will be taking care of pt post discharge. Her [place] is that pt will not be admitted, she feels fully equipped to care for her at home. Will continue to update family.

## 2019-02-02 NOTE — ED Notes (Signed)
This RN updated pt's sister in law, Mel Almond and informed her that pt has a bed on [address]. I gave her the number to the unit. Pt very thankful.

## 2019-02-02 NOTE — ED Notes (Signed)
Malena Peer- sister in law: [phone] Call for updates-pt has memory issues.

## 2019-02-02 NOTE — ED Provider Notes (Signed)
[facility] EMERGENCY DEPARTMENT Provider Note   CSN: [id number] Arrival date & time: 02/02/19  1439    History   Chief Complaint Chief Complaint  Patient presents with  . Shortness of Breath  . Leg Swelling    HPI Carrie [name] is a 78 y.o. 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 She states at baseline she is able to do most of her ADL's but does tend to get SOB on exertion. However, for the past two weeks her SOB has worsened to the point she has not been ambulating much at all and mostly been in bed this week. She states her legs feel weak and more swollen than normal. She has been taking all of her medications as prescribed. No changes to her meds or diet recently. Denies any recent illnesses, sick contacts, fever, chills, headache, n/v, chest pain, chest palpitations, abdominal pain, diarrhea or constipation. She states she has been urinating less than normal the past couple of days.      HPI  Past Medical History:  Diagnosis Date  . Anticoagulated on Coumadin    for mech valve and atrial fib  goal 2.0-2.5  . Anxiety   . Arthritis    "right shoulder" (03/15/2013)  . Atrial fibrillation ([place])   . Atrial flutter ([place])   . CAD (coronary artery disease) 08/11/2016  . CHF (congestive heart failure) ([place])   . Chronic combined systolic and diastolic CHF (congestive heart failure) ([place])    a. 02/2016 Echo: EF 45-50%.  . Diverticula of colon   . Exertional shortness of breath   . GERD (gastroesophageal reflux disease)   . Heart murmur   . Hemorrhage intraabdominal 03/17/2012  . History of blood transfusion    "once" (03/15/2013)  . Hyperlipidemia   . Hypertension   . Liver hemorrhage   .  Migraines   . 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 Bi-leaflet St. Jude mechanical prosthesis; b. 02/2016 Echo: EF 45-50%, some degree of MR, sev dil LA/RA, sev TR.  . NSTEMI (non-ST elevated myocardial infarction) ([place])    a. 02/2016 elev trop/Cath: nonobs dzs,   . Pacemaker    medtronic adapta  . Pneumonia 2009   resolved.? OPD Rx  . Severe tricuspid regurgitation    a. 02/2016 Echo: Ef 45-50%, sev TR, PASP 79mmHg.  . Sick sinus syndrome ([place])    Dr Cristopher Peru. EP study negative. Pacemaker 12/15/06 Medtronic  . Stroke [facility])    "they say I had a stroke last year" denies residual on 03/15/2013  . Subdural hematoma [facility])     Patient Active Problem List   Diagnosis Date Noted  . Diarrhea 03/31/2018  . Abnormal LFTs 03/31/2018  . Hemoptysis 03/30/2018  . Elevated troponin 03/30/2018  . CKD (chronic kidney disease), stage III ([place]) 03/30/2018  . HLD (hyperlipidemia) 03/30/2018  . Blurry vision 03/30/2018  . Status post coronary artery stent placement   . Chronic diastolic heart failure ([place])   . History of mitral valve replacement with mechanical valve   . Hypertensive heart disease with heart failure ([place])   . Pure hypercholesterolemia   . Coronary artery disease involving native coronary artery of native heart without angina pectoris 08/11/2016  . PAH (pulmonary artery hypertension) ([place]) 05/05/2016  .  Anemia due to other cause   . AKI (acute kidney injury) ([place])   . Hyponatremia 03/12/2016  . Chest pain 03/11/2016  . NSTEMI (non-ST elevated myocardial infarction) ([place]) 03/11/2016  . 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 Thrombocytopenia ([place]) 12/03/2014  . SSS (sick sinus syndrome) ([place]) 07/24/2014  . Longstanding persistent atrial fibrillation 07/24/2014  . Pacemaker 07/24/2014  . Tachycardia, a fib with RVR  04/03/2013  . Essential hypertension 04/03/2013  . SAH (subarachnoid hemorrhage), December 2012 03/21/2012  . Hemorrhage, hepatic,  Rx'd with Coumadin reversal and embolisation 03/17/12 03/17/2012  . 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 Chronic anticoagulation, ( INR goal 2.0-2.5 due to history of subdural hematoma and liver hemorrhage) 09/09/2011  . Muscle weakness of lower extremity 09/08/2011  . Pacemaker - single chamber Medtronic Adapta, 2008 09/08/2011  . S/P mitral valve replacement, St Jude 09/06/2011  . CONSTIPATION 02/12/2009    Past Surgical History:  Procedure Laterality Date  . APPENDECTOMY    . CARDIAC CATHETERIZATION  04/02/97   R&L:severe MR/pulmonary hypertension  . CARDIAC CATHETERIZATION N/A 03/16/2016   Procedure: Left Heart Cath and Coronary Angiography;  Surgeon: [name], MD;  Location: [facility] CV LAB;  Service: Cardiovascular;  Laterality: N/A;  . CARDIAC CATHETERIZATION N/A 09/30/2016   Procedure: Left Heart Cath and Coronary Angiography;  Surgeon: [name], MD;  Location: [facility] CV LAB;  Service: Cardiovascular;  Laterality: N/A;  . CARDIAC CATHETERIZATION N/A 09/30/2016   Procedure: Coronary Stent Intervention;  Surgeon: [name], MD;  3.5 x 12 mm resolute Onyx  to ostial RCA  . CATARACT EXTRACTION W/ INTRAOCULAR LENS  IMPLANT, BILATERAL Bilateral 2013  . CORONARY ANGIOPLASTY    . DILATION AND CURETTAGE OF UTERUS     "had fibroids" (03/15/2013)  . EXPLORATORY LAPAROTOMY     "had a growth on my intestines" (03/15/2013)  . HAMMER TOE SURGERY Right   . INSERT / REPLACE / REMOVE PACEMAKER  12/15/2006   Medtronic  . MITRAL VALVE REPLACEMENT  1998   St Jude mechanical; Dr. [name]  . PACEMAKER PLACEMENT  12/15/06   medtronic adapta for SSS  . PERMANENT PACEMAKER GENERATOR CHANGE N/A 07/24/2014   Procedure: PERMANENT PACEMAKER GENERATOR CHANGE;  Surgeon: [name], MD;  Location: [facility] CATH LAB;  Service: Cardiovascular;  Laterality: N/A;  . PERSANTINE CARDIOLITE  08/07/03   mild inf. ischemia   . TEE WITHOUT CARDIOVERSION  09/23/2011    Procedure: TRANSESOPHAGEAL ECHOCARDIOGRAM (TEE);  Surgeon: [name];  Location: [facility] ENDOS[facility];  Service: Cardiovascular;  Laterality: N/A;  . TONSILLECTOMY    . TUBAL LIGATION    . US ECHOCARDIOGRAPHY  11/19/2011   EF 50-55%,RA mod to severely dilated,LA severely dilated,trace MR,small vegetation or mass on the MV,AOV mildly scleroticmild PI, RV pressure 40-33mmHg     OB History   No obstetric history on file.      Home Medications    Prior to Admission medications   Medication Sig Start Date End Date Taking? Authorizing Provider  atorvastatin (LIPITOR) 40 MG tablet TAKE 1 TABLET BY MOUTH EVERY DAY AT [facility] 09/27/18   Croitoru, Mihai, MD  Biotin 1000 MCG tablet Take 1,000 mcg by mouth daily.    [provider]  dextromethorphan-guaiFENesin (MUCINEX DM) 30-600 MG 12hr tablet Take 1 tablet by mouth 2 (two) times daily as needed for cough. 04/01/18   Hosie Poisson, MD  diltiazem (CARDIZEM CD) 120 MG 24 hr capsule TAKE ONE CAPSULE BY MOUTH  DAILY 11/28/18   Croitoru, Mihai, MD  docusate sodium (COLACE) 100 MG capsule Take 200 mg by mouth 2 (two) times daily.     [provider]  furosemide (LASIX) 40 MG tablet Take 40 mg by mouth 2 (two) times daily.    [provider]  metolazone (ZAROXOLYN) 2.5 MG tablet Take 2.5 mg 30 mins before morning dose of Lasix 01/13/19   Croitoru, Mihai, MD  metoprolol tartrate (LOPRESSOR) 25 MG tablet Take 1 tablet (25 mg total) by mouth 2 (two) times daily. 11/10/16   Croitoru, Mihai, MD  Multiple Vitamin (MULTIVITAMINS PO) Take 1 tablet by mouth daily.     [provider]  nitroGLYCERIN (NITROSTAT) 0.4 MG SL tablet Place 1 tablet (0.4 mg total) under the tongue every 5 (five) minutes as needed for chest pain. 11/20/16   Croitoru, Mihai, MD  Omega-3 Fatty Acids (FISH OIL) 1000 MG CAPS Take 1,000 mg by mouth daily.    [provider]  polyethylene glycol (MIRALAX / GLYCOLAX) packet Take 17 g by mouth as needed (for  constipation). Reported on 03/25/2016    [provider]  potassium chloride SA (K-DUR,KLOR-CON) 20 MEQ tablet Take 2 tablets (40 mEq total) by mouth every morning AND 1 tablet (20 mEq total) every evening. 06/01/18   Croitoru, Mihai, MD  warfarin (COUMADIN) 5 MG tablet TAKE 1 TO 1 AND 1/2 TABLETS BY MOUTH DAILY AS DIRECTED 12/15/18   Croitoru, Dani Gobble, MD    Family History Family History  Problem Relation Age of Onset  . Cancer Mother   . Stroke Father   . Cancer Sister   . Leukemia Sister   . Healthy Brother   . Healthy Sister   . Diabetes Sister   . Healthy Brother   . Healthy Brother   . Heart attack Maternal Grandfather   . Kidney disease Daughter   . Stroke Brother     Social History Social History   Tobacco Use  . Smoking status: Never Smoker  . Smokeless tobacco: Never Used  Substance Use Topics  . Alcohol use: No  . Drug use: No     Allergies   Codeine   Review of Systems Review of Systems  Constitutional: Positive for activity change and fatigue. Negative for appetite change, chills, diaphoresis, fever and unexpected weight change.  HENT: Negative for congestion, rhinorrhea, sinus pain and sore throat.   Eyes: Positive for redness. Negative for photophobia, pain and visual disturbance.  Respiratory: Positive for shortness of breath. Negative for cough, chest tightness and wheezing.   Cardiovascular: Positive for leg swelling. Negative for chest pain and palpitations.  Gastrointestinal: Negative for abdominal distention, abdominal pain, constipation, diarrhea, nausea and vomiting.  Genitourinary: Positive for decreased urine volume and difficulty urinating. Negative for dysuria, frequency, hematuria and urgency.  Neurological: Negative for dizziness, weakness, light-headedness, numbness and headaches.     Physical Exam Updated Vital Signs BP 131/81   Pulse 63   Temp 98.5 F (36.9 C) (Oral)   Resp (!) 21   SpO2 100%   Physical Exam  Constitutional:      General: She is not in acute distress.    Appearance: She is well-developed. She is not ill-appearing.  HENT:     Head: Normocephalic and atraumatic.  Eyes:     Comments: Left eye conjunctivitis   Cardiovascular:     Rate and Rhythm: Normal rate and regular rhythm.     Pulses: Normal pulses.     Heart sounds: Murmur present.  Pulmonary:     Effort: Pulmonary effort is normal.     Breath sounds: Rhonchi and rales present.  Chest:     Chest wall: No tenderness.  Abdominal:     General: Abdomen is flat. Bowel sounds are normal. There is no distension.     Palpations: Abdomen is soft.     Tenderness: There is no abdominal tenderness.  Musculoskeletal:        General: Swelling present. No tenderness.     Right lower leg: Edema present.     Left lower leg: Edema present.  Skin:    General: Skin is warm and dry.  Neurological:     Mental Status: She is alert and oriented to person, place, and time.  Psychiatric:        Mood and Affect: Mood normal.        Behavior: Behavior normal.      ED Treatments / Results  Labs (all labs ordered are listed, but only abnormal results are displayed) Labs Reviewed  BASIC METABOLIC PANEL - Abnormal; Notable for the following components:      Result Value   Glucose, Bld 106 (*)    BUN 39 (*)    Creatinine, Ser 1.42 (*)    GFR calc non Af Amer 36 (*)    GFR calc Af Amer 41 (*)    All other components within normal limits  CBC WITH DIFFERENTIAL/PLATELET - Abnormal; Notable for the following components:   WBC 3.9 (*)    RBC 3.21 (*)    Hemoglobin 9.2 (*)    HCT 28.4 (*)    RDW 27.3 (*)    nRBC 1.0 (*)    Lymphs Abs 0.6 (*)    All other components within normal limits  BRAIN NATRIURETIC PEPTIDE - Abnormal; Notable for the following components:   B Natriuretic Peptide 2,329.2 (*)    All other components within normal limits  PROTIME-INR - Abnormal; Notable for the following components:   Prothrombin Time 24.5 (*)     INR 2.2 (*)    All other components within normal limits  TROPONIN I - Abnormal; Notable for the following components:   Troponin I 0.05 (*)    All other components within normal limits  SARS CORONAVIRUS 2 (HOSPITAL ORDER, [facility])  PATHOLOGIST SMEAR REVIEW    EKG Suspect arm lead reversal, interpretation assumes no reversal Atrial fibrillation with a competing junctional pacemaker Non-specific intra-ventricular conduction block Anterolateral infarct, age undetermined   Radiology Dg Chest 2 View  Result Date: 02/02/2019 CLINICAL DATA:  Shortness of breath EXAM: CHEST - 2 VIEW COMPARISON:  03/30/2018 CT chest.  Chest x-ray dated 07/10 2019. FINDINGS: Again identified is massive cardiomegaly. The patient is status post prior median sternotomy. There is a persistent airspace opacity at the right lung base favored to represent a combination of atelectasis and a small right-sided pleural effusion. There is scarring at the right lung base. The left lung field is mostly clear. There is no pneumothorax. There is vascular congestion without overt pulmonary edema. IMPRESSION: 1. Persistent severe cardiomegaly. 2. Opacity at the right lung base favored to represent a combination of a small right-sided pleural effusion and atelectasis. An underlying infiltrate is difficult to exclude but overall this appearance is minimally changed from chest x-ray dated 03/30/2018. 3. No pneumothorax.  Mild vascular congestion. Electronically Signed   By: Constance Holster M.D.   On: 02/02/2019 15:30    Procedures Procedures (including critical care time)  Medications Ordered in ED Medications  furosemide (LASIX) injection 40 mg (40 mg Intravenous Given 02/02/19 1806)     Initial Impression / Assessment and Plan / ED Course  I have reviewed the triage vital signs and the nursing notes.  Pertinent labs & imaging results that were available during my care of the patient were  reviewed by me and considered in my medical decision making (see chart for details).  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   Chest xray shows persistent severe cardiomegaly with an opacity at the right lung base, favored to represent a small right-sided pleural effusion and atelectasis. Mild vascular congestion. CBC with no leukocytosis. Hgb stable 9.2. BMP shows a Cr of 1.42, 1.39 8 days ago; 1.19 8 months ago. Troponin 0.05, likely demand ischemia. BNP 2,329. Likely CHF exacerbation, given 40 mg IV furosemide. Will consult triad for hospital admission.     Final Clinical Impressions(s) / ED Diagnoses   Final diagnoses:  Acute on chronic combined systolic and diastolic congestive heart failure [facility])  Shortness of breath    ED Discharge Orders    None       Rehman, Areeg N, DO 02/02/19 [place]    Quintella Reichert, MD 02/03/19 1110

## 2019-02-02 NOTE — ED Notes (Signed)
EDP notified of trop result

## 2019-02-02 NOTE — Progress Notes (Signed)
Patient arrived to unit, seems a little short of breath. Give pt O2 and instructed on how to use it. VSS. Left eye red, pt states shampoo got into her eye on yesterday and eye was not red before yesterday. Will continue to monitor.

## 2019-02-02 NOTE — ED Notes (Signed)
Lab confirmed they have BNP sample and are running

## 2019-02-02 NOTE — ED Notes (Signed)
ED TO INPATIENT HANDOFF REPORT  ED Nurse Name and Phone #: Jinny Blossom [phone]  S Name/Age/Gender Carrie [name] 78 y.o. female Room/Bed: 039C/039C  Code Status   Code Status: Prior  Home/SNF/Other Home Patient oriented to: self, place and situation Is this baseline? Yes   Triage Complete: Triage complete  Chief Complaint sob/fluid retention  Triage Note Pt reports leg swelling, weakness, SOB x 2 weeks.    Allergies Allergies  Allergen Reactions  . Codeine Nausea And Vomiting    Level of Care/Admitting Diagnosis ED Disposition    ED Disposition Condition [facility]: [place] [100100]  Level of Care: Telemetry Cardiac [103]  I expect the patient will be discharged within 24 hours: No (not a candidate for 5C-Observation unit)  Covid Evaluation: N/A  Diagnosis: CHF (congestive heart failure) ([place]) [947654]  Admitting Physician: Bonnell Public [[id number]]  Attending Physician: Dana Allan I [[id number]]  PT Class (Do Not Modify): Observation [104]  PT Acc Code (Do Not Modify): Observation [10022]       B Medical/Surgery History Past Medical History:  Diagnosis Date  . Anticoagulated on Coumadin    for mech valve and atrial fib  goal 2.0-2.5  . Anxiety   . Arthritis    "right shoulder" (03/15/2013)  . Atrial fibrillation ([place])   . Atrial flutter ([place])   . CAD (coronary artery disease) 08/11/2016  . CHF (congestive heart failure) ([place])   . Chronic combined systolic and diastolic CHF (congestive heart failure) ([place])    a. 02/2016 Echo: EF 45-50%.  . Diverticula of colon   . Exertional shortness of breath   . GERD (gastroesophageal reflux disease)   . Heart murmur   . Hemorrhage intraabdominal 03/17/2012  . History of blood transfusion    "once" (03/15/2013)  . Hyperlipidemia   . Hypertension   . Liver hemorrhage   . Migraines   . 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 Bi-leaflet St. Jude mechanical  prosthesis; b. 02/2016 Echo: EF 45-50%, some degree of MR, sev dil LA/RA, sev TR.  . NSTEMI (non-ST elevated myocardial infarction) ([place])    a. 02/2016 elev trop/Cath: nonobs dzs,   . Pacemaker    medtronic adapta  . Pneumonia 2009   resolved.? OPD Rx  . Severe tricuspid regurgitation    a. 02/2016 Echo: Ef 45-50%, sev TR, PASP 49mmHg.  . Sick sinus syndrome ([place])    Dr Cristopher Peru. EP study negative. Pacemaker 12/15/06 Medtronic  . Stroke [facility])    "they say I had a stroke last year" denies residual on 03/15/2013  . Subdural hematoma [facility]    Past Surgical History:  Procedure Laterality Date  . APPENDECTOMY    . CARDIAC CATHETERIZATION  04/02/97   R&L:severe MR/pulmonary hypertension  . CARDIAC CATHETERIZATION N/A 03/16/2016   Procedure: Left Heart Cath and Coronary Angiography;  Surgeon: [name], MD;  Location: [place] CV LAB;  Service: Cardiovascular;  Laterality: N/A;  . CARDIAC CATHETERIZATION N/A 09/30/2016   Procedure: Left Heart Cath and Coronary Angiography;  Surgeon: [name], MD;  Location: [place] CV LAB;  Service: Cardiovascular;  Laterality: N/A;  . CARDIAC CATHETERIZATION N/A 09/30/2016   Procedure: Coronary Stent Intervention;  Surgeon: [name], MD;  3.5 x 12 mm resolute Onyx  to ostial RCA  . CATARACT EXTRACTION W/ INTRAOCULAR LENS  IMPLANT, BILATERAL Bilateral 2013  . CORONARY ANGIOPLASTY    . DILATION AND CURETTAGE OF UTERUS     "  had fibroids" (03/15/2013)  . EXPLORATORY LAPAROTOMY     "had a growth on my intestines" (03/15/2013)  . HAMMER TOE SURGERY Right   . INSERT / REPLACE / REMOVE PACEMAKER  12/15/2006   Medtronic  . MITRAL VALVE REPLACEMENT  1998   St Jude mechanical; Dr. [name]  . PACEMAKER PLACEMENT  12/15/06   medtronic adapta for SSS  . PERMANENT PACEMAKER GENERATOR CHANGE N/A 07/24/2014   Procedure: PERMANENT PACEMAKER GENERATOR CHANGE;  Surgeon: [name], MD;  Location: [place] CATH LAB;  Service: Cardiovascular;   Laterality: N/A;  . PERSANTINE CARDIOLITE  08/07/03   mild inf. ischemia   . TEE WITHOUT CARDIOVERSION  09/23/2011   Procedure: TRANSESOPHAGEAL ECHOCARDIOGRAM (TEE);  Surgeon: [name];  [facility];  Service: Cardiovascular;  Laterality: N/A;  . TONSILLECTOMY    . TUBAL LIGATION    . US ECHOCARDIOGRAPHY  11/19/2011   EF 50-55%,RA mod to severely dilated,LA severely dilated,trace MR,small vegetation or mass on the MV,AOV mildly scleroticmild PI, RV pressure 40-45mmHg     A IV Location/Drains/Wounds Patient Lines/Drains/Airways Status   Active Line/Drains/Airways    Name:   Placement date:   Placement time:   Site:   Days:   Peripheral IV 02/02/19 Right Antecubital   02/02/19    1618    Antecubital   less than 1   External Urinary Catheter   02/02/19    1618    -   less than 1          Intake/Output Last 24 hours No intake or output data in the 24 hours ending 02/02/19 1857  Labs/Imaging Results for orders placed or performed during the hospital encounter of 02/02/19 (from the past 48 hour(s))  Basic metabolic panel     Status: Abnormal   Collection Time: 02/02/19  4:19 PM  Result Value Ref Range   Sodium 137 135 - 145 mmol/L   Potassium 4.5 3.5 - 5.1 mmol/L   Chloride 105 98 - 111 mmol/L   CO2 23 22 - 32 mmol/L   Glucose, Bld 106 (H) 70 - 99 mg/dL   BUN 39 (H) 8 - 23 mg/dL   Creatinine, Ser 1.42 (H) 0.44 - 1.00 mg/dL   Calcium 9.4 8.9 - 10.3 mg/dL   GFR calc non Af Amer 36 (L) >60 mL/min   GFR calc Af Amer 41 (L) >60 mL/min   Anion gap 9 5 - 15    Comment: Performed at [facility], [address], [place], [place] [address]  CBC with Differential     Status: Abnormal   Collection Time: 02/02/19  4:19 PM  Result Value Ref Range   WBC 3.9 (L) 4.0 - 10.5 K/uL   RBC 3.21 (L) 3.87 - 5.11 MIL/uL   Hemoglobin 9.2 (L) 12.0 - 15.0 g/dL   HCT 28.4 (L) 36.0 - 46.0 %   MCV 88.5 80.0 - 100.0 fL   MCH 28.7 26.0 - 34.0 pg   MCHC 32.4 30.0 - 36.0 g/dL   RDW  27.3 (H) 11.5 - 15.5 %   Platelets 153 150 - 400 K/uL    Comment: REPEATED TO VERIFY PLATELET COUNT CONFIRMED BY SMEAR    nRBC 1.0 (H) 0.0 - 0.2 %   Neutrophils Relative % 59 %   Neutro Abs 2.3 1.7 - 7.7 K/uL   Lymphocytes Relative 16 %   Lymphs Abs 0.6 (L) 0.7 - 4.0 K/uL   Monocytes Relative 21 %   Monocytes Absolute 0.8  0.1 - 1.0 K/uL   Eosinophils Relative 3 %   Eosinophils Absolute 0.1 0.0 - 0.5 K/uL   Basophils Relative 1 %   Basophils Absolute 0.0 0.0 - 0.1 K/uL   Immature Granulocytes 0 %   Abs Immature Granulocytes 0.01 0.00 - 0.07 K/uL   Schistocytes PRESENT    Polychromasia PRESENT    Spherocytes PRESENT     Comment: Performed at [facility] [address], [place], [place] [address]  Brain natriuretic peptide     Status: Abnormal   Collection Time: 02/02/19  4:19 PM  Result Value Ref Range   B Natriuretic Peptide 2,329.2 (H) 0.0 - 100.0 pg/mL    Comment: Performed at [facility] [address] [place], [place] [address]  Protime-INR     Status: Abnormal   Collection Time: 02/02/19  4:28 PM  Result Value Ref Range   Prothrombin Time 24.5 (H) 11.4 - 15.2 seconds   INR 2.2 (H) 0.8 - 1.2    Comment: (NOTE) INR goal varies based on device and disease states. Performed at [facility], [place] [address] [place], [place] [address]   Troponin I - Once     Status: Abnormal   Collection Time: 02/02/19  4:28 PM  Result Value Ref Range   Troponin I 0.05 (HH) <0.03 ng/mL    Comment: CRITICAL RESULT CALLED TO, READ BACK BY AND VERIFIED WITHDorrene German 1722 02/02/2019 D BRADLEY Performed at [facility], [place] [address] [place], [place] [address]    Dg Chest 2 View  Result Date: 02/02/2019 CLINICAL DATA:  Shortness of breath EXAM: CHEST - 2 VIEW COMPARISON:  03/30/2018 CT chest.  Chest x-ray dated 07/10 2019. FINDINGS: Again identified is massive cardiomegaly. The patient is status post prior median sternotomy. There is a persistent airspace  opacity at the right lung base favored to represent a combination of atelectasis and a small right-sided pleural effusion. There is scarring at the right lung base. The left lung field is mostly clear. There is no pneumothorax. There is vascular congestion without overt pulmonary edema. IMPRESSION: 1. Persistent severe cardiomegaly. 2. Opacity at the right lung base favored to represent a combination of a small right-sided pleural effusion and atelectasis. An underlying infiltrate is difficult to exclude but overall this appearance is minimally changed from chest x-ray dated 03/30/2018. 3. No pneumothorax.  Mild vascular congestion. Electronically Signed   By: Constance Holster M.D.   On: 02/02/2019 15:30    Pending Labs Unresulted Labs (From admission, onward)    Start     Ordered   02/02/19 1808  SARS Coronavirus 2 (CEPHEID - Performed in [place] hospital lab), [facility] Order  (Asymptomatic Patients Labs)  Once,   R    Question:  Rule Out  Answer:  Yes   02/02/19 1807   02/02/19 1619  Pathologist smear review  Once,   STAT     02/02/19 1619          Vitals/Pain Today's Vitals   02/02/19 1745 02/02/19 1800 02/02/19 1830 02/02/19 1845  BP:  131/81 129/73 (!) 148/85  Pulse: 66 63 73 72  Resp: 17 (!) 21 19 17   Temp:      TempSrc:      SpO2: 99% 100% 100% 100%  PainSc:        Isolation Precautions No active isolations  Medications Medications  furosemide (LASIX) injection 20 mg (has no administration in time range)  furosemide (  LASIX) injection 40 mg (40 mg Intravenous Given 02/02/19 1806)    Mobility walks with device Low fall risk   Focused Assessments Cardiac Assessment Handoff:    Lab Results  Component Value Date   CKTOTAL 116 09/06/2011   CKMB 2.3 09/06/2011   TROPONINI 0.05 (HH) 02/02/2019   No results found for: DDIMER Does the Patient currently have chest pain? No     R Recommendations: See Admitting Provider Note  Report given to:   Additional  Notes:  Pt alert and oriented, on room air, pt being admitted r/t CHF, BNP and troponin elevated, pt has purewick in place and 20 gauge in RAC

## 2019-02-02 NOTE — ED Notes (Signed)
Attempted report, floor still in report, RN left contact number

## 2019-02-02 NOTE — ED Triage Notes (Signed)
Pt reports leg swelling, weakness, SOB x 2 weeks.

## 2019-02-02 NOTE — H&P (Signed)
History and Physical  Carrie [name] NWG:[id number] DOB: Jul 25, 1941 DOA: 02/02/2019  Referring physician: ER provider PCP: Lucianne Lei, MD  Outpatient Specialists: Cardiology Patient coming from: Home  Chief Complaint: Shortness of breath for 2 weeks  HPI: Patient is a 85-year African-American female with multiple cardiac problems.  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  Patient presents with 2 week history of progressively worsening shortness of breath, leg edema, orthopnea and dyspnea on exertion.  No headache, no neck pain, no chest pain, no fever or chills, no GI symptoms and no urinary symptoms.  On presentation to the hospital, cardiac BNP was greater than 2000 and a chest x-ray revealed severe cardiomegaly with mild pulmonary congestion.  Hospitalist team has been asked to admit patient for further assessment and management.  ED Course: Patient was diuresed with IV Lasix (total of 60 mg).  Patient symptoms improved with diuresis.   Pertinent labs: BMP revealed sodium of 137, potassium of 4.5, chloride 105, CO2 23, BUN of 39 with creatinine of 1.41 blood sugar 106.  Cardiac BNP is 2329.  First troponin is 0.05.  CBC reveals WBC of 3.9, hemoglobin of 9.2, hematocrit of 28.4, MCV of 88.5 with platelet count of 153.  Rapid COVID-19 test came back negative.  INR is 2.2.  Chest x-ray finding is as documented above.  Imaging: independently reviewed.   Review of Systems:  Negative for fever, visual changes, sore throat, rash, new muscle aches, chest pain, dysuria, bleeding, n/v/abdominal pain.  Past Medical History:  Diagnosis Date  . Anticoagulated on Coumadin    for mech valve and atrial fib  goal 2.0-2.5   . Anxiety   . Arthritis    "right shoulder" (03/15/2013)  . Atrial fibrillation ([place])   . Atrial flutter ([place])   . CAD (coronary artery disease) 08/11/2016  . CHF (congestive heart failure) ([place])   . Chronic combined systolic and diastolic CHF (congestive heart failure) ([place])    a. 02/2016 Echo: EF 45-50%.  . Diverticula of colon   . Exertional shortness of breath   . GERD (gastroesophageal reflux disease)   . Heart murmur   . Hemorrhage intraabdominal 03/17/2012  . History of blood transfusion    "once" (03/15/2013)  . Hyperlipidemia   . Hypertension   . Liver hemorrhage   . Migraines   . 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 Bi-leaflet St. Jude mechanical prosthesis; b. 02/2016 Echo: EF 45-50%, some degree of MR, sev dil LA/RA, sev TR.  . NSTEMI (non-ST elevated myocardial infarction) ([place])    a. 02/2016 elev trop/Cath: nonobs dzs,   . Pacemaker    medtronic adapta  . Pneumonia 2009   resolved.? OPD Rx  . Severe tricuspid regurgitation    a. 02/2016 Echo: Ef 45-50%, sev TR, PASP 2mmHg.  . Sick sinus syndrome ([place])    Dr Cristopher Peru. EP study negative. Pacemaker 12/15/06 Medtronic  . Stroke [facility])    "they say I had a stroke last year" denies residual on 03/15/2013  . Subdural hematoma [facility])     Past Surgical History:  Procedure Laterality Date  . APPENDECTOMY    . CARDIAC CATHETERIZATION  04/02/97   R&L:severe MR/pulmonary hypertension  . CARDIAC CATHETERIZATION N/A 03/16/2016   Procedure: Left Heart Cath and Coronary  Angiography;  Surgeon: [name], MD;  Location: [place] CV LAB;  Service: Cardiovascular;  Laterality: N/A;  . CARDIAC CATHETERIZATION N/A 09/30/2016   Procedure: Left Heart Cath and Coronary Angiography;  Surgeon: [name], MD;  Location: [place] CV LAB;  Service: Cardiovascular;  Laterality: N/A;  . CARDIAC CATHETERIZATION N/A 09/30/2016   Procedure: Coronary Stent Intervention;  Surgeon: [name], MD;  3.5 x 12  mm resolute Onyx  to ostial RCA  . CATARACT EXTRACTION W/ INTRAOCULAR LENS  IMPLANT, BILATERAL Bilateral 2013  . CORONARY ANGIOPLASTY    . DILATION AND CURETTAGE OF UTERUS     "had fibroids" (03/15/2013)  . EXPLORATORY LAPAROTOMY     "had a growth on my intestines" (03/15/2013)  . HAMMER TOE SURGERY Right   . INSERT / REPLACE / REMOVE PACEMAKER  12/15/2006   Medtronic  . MITRAL VALVE REPLACEMENT  1998   St Jude mechanical; Dr. [name]  . PACEMAKER PLACEMENT  12/15/06   medtronic adapta for SSS  . PERMANENT PACEMAKER GENERATOR CHANGE N/A 07/24/2014   Procedure: PERMANENT PACEMAKER GENERATOR CHANGE;  Surgeon: [name], MD;  Location: [place] CATH LAB;  Service: Cardiovascular;  Laterality: N/A;  . PERSANTINE CARDIOLITE  08/07/03   mild inf. ischemia   . TEE WITHOUT CARDIOVERSION  09/23/2011   Procedure: TRANSESOPHAGEAL ECHOCARDIOGRAM (TEE);  Surgeon: [name];  [facility];  Service: Cardiovascular;  Laterality: N/A;  . TONSILLECTOMY    . TUBAL LIGATION    . US ECHOCARDIOGRAPHY  11/19/2011   EF 50-55%,RA mod to severely dilated,LA severely dilated,trace MR,small vegetation or mass on the MV,AOV mildly scleroticmild PI, RV pressure 40-70mmHg     reports that she has never smoked. She has never used smokeless tobacco. She reports that she does not drink alcohol or use drugs.  Allergies  Allergen Reactions  . Codeine Nausea And Vomiting    Family History  Problem Relation Age of Onset  . Cancer Mother   . Stroke Father   . Cancer Sister   . Leukemia Sister   . Healthy Brother   . Healthy Sister   . Diabetes Sister   . Healthy Brother   . Healthy Brother   . Heart attack Maternal Grandfather   . Kidney disease Daughter   . Stroke Brother      Prior to Admission medications   Medication Sig Start Date End Date Taking? Authorizing Provider  atorvastatin (LIPITOR) 40 MG tablet TAKE 1 TABLET BY MOUTH EVERY DAY AT [facility] 09/27/18   Croitoru, Mihai, MD  Biotin 1000  MCG tablet Take 1,000 mcg by mouth daily.    [provider]  dextromethorphan-guaiFENesin (MUCINEX DM) 30-600 MG 12hr tablet Take 1 tablet by mouth 2 (two) times daily as needed for cough. 04/01/18   Hosie Poisson, MD  diltiazem (CARDIZEM CD) 120 MG 24 hr capsule TAKE ONE CAPSULE BY MOUTH DAILY 11/28/18   Croitoru, Dani Gobble, MD  docusate sodium (COLACE) 100 MG capsule Take 200 mg by mouth 2 (two) times daily.     [provider]  furosemide (LASIX) 40 MG tablet Take 40 mg by mouth 2 (two) times daily.    [provider]  metolazone (ZAROXOLYN) 2.5 MG tablet Take 2.5 mg 30 mins before morning dose of Lasix 01/13/19   Croitoru, Mihai, MD  metoprolol tartrate (LOPRESSOR) 25 MG tablet Take 1 tablet (25 mg total) by mouth 2 (two) times daily. 11/10/16   Croitoru, Mihai, MD  Multiple Vitamin (MULTIVITAMINS PO)  Take 1 tablet by mouth daily.     [provider]  nitroGLYCERIN (NITROSTAT) 0.4 MG SL tablet Place 1 tablet (0.4 mg total) under the tongue every 5 (five) minutes as needed for chest pain. 11/20/16   Croitoru, Mihai, MD  Omega-3 Fatty Acids (FISH OIL) 1000 MG CAPS Take 1,000 mg by mouth daily.    [provider]  polyethylene glycol (MIRALAX / GLYCOLAX) packet Take 17 g by mouth as needed (for constipation). Reported on 03/25/2016    [provider]  potassium chloride SA (K-DUR,KLOR-CON) 20 MEQ tablet Take 2 tablets (40 mEq total) by mouth every morning AND 1 tablet (20 mEq total) every evening. 06/01/18   Croitoru, Mihai, MD  warfarin (COUMADIN) 5 MG tablet TAKE 1 TO 1 AND 1/2 TABLETS BY MOUTH DAILY AS DIRECTED 12/15/18   Croitoru, Dani Gobble, MD    Physical Exam: Vitals:   02/02/19 1745 02/02/19 1800 02/02/19 1830 02/02/19 1845  BP:  131/81 129/73 (!) 148/85  Pulse: 66 63 73 72  Resp: 17 (!) 21 19 17   Temp:      TempSrc:      SpO2: 99% 100% 100% 100%    Constitutional:  . Appears calm and comfortable Eyes:  . Pallor. No jaundice.  ENMT:  .  external ears, nose appear normal Neck:  . Neck is supple.  Prominent neck veins.   Respiratory:  . CTA bilaterally, no w/r/r.  . Respiratory effort normal. No retractions or accessory muscle use Cardiovascular:  . S1S2 . 1+ to 2 bilateral lower leg edema, seems chronic.    Abdomen:  . Abdomen is soft and non tender. Organs are difficult to assess. Neurologic:  . Awake and alert. . Moves all limbs.  Wt Readings from Last 3 Encounters:  01/11/19 62.4 kg  07/07/18 58.7 kg  06/01/18 60.7 kg    I have personally reviewed following labs and imaging studies  Labs on Admission:  CBC: Recent Labs  Lab 02/02/19 1619  WBC 3.9*  NEUTROABS 2.3  HGB 9.2*  HCT 28.4*  MCV 88.5  PLT 096   Basic Metabolic Panel: Recent Labs  Lab 02/02/19 1619  NA 137  K 4.5  CL 105  CO2 23  GLUCOSE 106*  BUN 39*  CREATININE 1.42*  CALCIUM 9.4   Liver Function Tests: No results for input(s): AST, ALT, ALKPHOS, BILITOT, PROT, ALBUMIN in the last 168 hours. No results for input(s): LIPASE, AMYLASE in the last 168 hours. No results for input(s): AMMONIA in the last 168 hours. Coagulation Profile: Recent Labs  Lab 02/02/19 1628  INR 2.2*   Cardiac Enzymes: Recent Labs  Lab 02/02/19 1628  TROPONINI 0.05*   BNP (last 3 results) No results for input(s): PROBNP in the last 8760 hours. HbA1C: No results for input(s): HGBA1C in the last 72 hours. CBG: No results for input(s): GLUCAP in the last 168 hours. Lipid Profile: No results for input(s): CHOL, HDL, LDLCALC, TRIG, CHOLHDL, LDLDIRECT in the last 72 hours. Thyroid Function Tests: No results for input(s): TSH, T4TOTAL, FREET4, T3FREE, THYROIDAB in the last 72 hours. Anemia Panel: No results for input(s): VITAMINB12, FOLATE, FERRITIN, TIBC, IRON, RETICCTPCT in the last 72 hours. Urine analysis:    Component Value Date/Time   COLORURINE YELLOW 03/18/2016 [place] 03/18/2016 1332   LABSPEC 1.013 03/18/2016 1332    PHURINE 7.0 03/18/2016 1332   GLUCOSEU NEGATIVE 03/18/2016 1332   HGBUR MODERATE (A) 03/18/2016 1332   BILIRUBINUR NEGATIVE 03/18/2016 1332  KETONESUR NEGATIVE 03/18/2016 1332   PROTEINUR NEGATIVE 03/18/2016 1332   UROBILINOGEN 0.2 03/15/2013 1323   NITRITE NEGATIVE 03/18/2016 1332   LEUKOCYTESUR NEGATIVE 03/18/2016 1332   Sepsis Labs: @LABRCNTIP (procalcitonin:4,lacticidven:4) )No results found for this or any previous visit (from the past 240 hour(s)).    Radiological Exams on Admission: Dg Chest 2 View  Result Date: 02/02/2019 CLINICAL DATA:  Shortness of breath EXAM: CHEST - 2 VIEW COMPARISON:  03/30/2018 CT chest.  Chest x-ray dated 07/10 2019. FINDINGS: Again identified is massive cardiomegaly. The patient is status post prior median sternotomy. There is a persistent airspace opacity at the right lung base favored to represent a combination of atelectasis and a small right-sided pleural effusion. There is scarring at the right lung base. The left lung field is mostly clear. There is no pneumothorax. There is vascular congestion without overt pulmonary edema. IMPRESSION: 1. Persistent severe cardiomegaly. 2. Opacity at the right lung base favored to represent a combination of a small right-sided pleural effusion and atelectasis. An underlying infiltrate is difficult to exclude but overall this appearance is minimally changed from chest x-ray dated 03/30/2018. 3. No pneumothorax.  Mild vascular congestion. Electronically Signed   By: Constance Holster M.D.   On: 02/02/2019 15:30    Active Problems:   CHF (congestive heart failure) (HCC)   Assessment/Plan Acute on chronic diastolic congestive heart failure/right-sided heart failure: Admit patient for further assessment and management.   Patient was on Lasix and Zaroxolyn prior to admission. IV Lasix whilst in the hospital Cautious use of diuresis, considering significant component of right heart failure. Consider changing patient's  oral Lasix to torsemide or Bumex on discharge Have a low threshold to consult the cardiology team.  Severe pulmonary hypertension:  Likely contributing to discharge shortness of breath and chronic leg edema/right heart failure. Continue management as above. Guarded long-term prognosis.  Atrial fibrillation/status post mechanical (history of rheumatic fever): Continue anticoagulantion Rate control  Coronary artery disease: Stable Troponin of 0.05 noted Cycle cardiac enzymes   DVT prophylaxis: Anticoagulation (Coumadin) Code Status: Full Family Communication:  Disposition Plan: Likely home in the morning Consults called: Have a low threshold to consult cardiology team Admission status: Observation  Time spent: 65 minutes  Dana Allan, MD  [facility] Pager #: [phone] 7PM-7AM contact night coverage as above  02/02/2019, 6:48 PM

## 2019-02-03 ENCOUNTER — Ambulatory Visit (HOSPITAL_BASED_OUTPATIENT_CLINIC_OR_DEPARTMENT_OTHER): Payer: Medicare Other

## 2019-02-03 DIAGNOSIS — R5381 Other malaise: Secondary | ICD-10-CM | POA: Diagnosis present

## 2019-02-03 DIAGNOSIS — I2721 Secondary pulmonary arterial hypertension: Secondary | ICD-10-CM | POA: Diagnosis not present

## 2019-02-03 DIAGNOSIS — N179 Acute kidney failure, unspecified: Secondary | ICD-10-CM | POA: Diagnosis present

## 2019-02-03 DIAGNOSIS — I5033 Acute on chronic diastolic (congestive) heart failure: Secondary | ICD-10-CM | POA: Diagnosis not present

## 2019-02-03 DIAGNOSIS — E876 Hypokalemia: Secondary | ICD-10-CM | POA: Diagnosis not present

## 2019-02-03 DIAGNOSIS — I361 Nonrheumatic tricuspid (valve) insufficiency: Secondary | ICD-10-CM

## 2019-02-03 DIAGNOSIS — I509 Heart failure, unspecified: Secondary | ICD-10-CM | POA: Diagnosis not present

## 2019-02-03 DIAGNOSIS — I5082 Biventricular heart failure: Secondary | ICD-10-CM | POA: Diagnosis present

## 2019-02-03 DIAGNOSIS — I371 Nonrheumatic pulmonary valve insufficiency: Secondary | ICD-10-CM | POA: Diagnosis not present

## 2019-02-03 DIAGNOSIS — I482 Chronic atrial fibrillation, unspecified: Secondary | ICD-10-CM | POA: Diagnosis not present

## 2019-02-03 DIAGNOSIS — Z8673 Personal history of transient ischemic attack (TIA), and cerebral infarction without residual deficits: Secondary | ICD-10-CM | POA: Diagnosis not present

## 2019-02-03 DIAGNOSIS — Z885 Allergy status to narcotic agent status: Secondary | ICD-10-CM | POA: Diagnosis not present

## 2019-02-03 DIAGNOSIS — Z8249 Family history of ischemic heart disease and other diseases of the circulatory system: Secondary | ICD-10-CM | POA: Diagnosis not present

## 2019-02-03 DIAGNOSIS — Z952 Presence of prosthetic heart valve: Secondary | ICD-10-CM | POA: Diagnosis not present

## 2019-02-03 DIAGNOSIS — I2510 Atherosclerotic heart disease of native coronary artery without angina pectoris: Secondary | ICD-10-CM | POA: Diagnosis present

## 2019-02-03 DIAGNOSIS — R0602 Shortness of breath: Secondary | ICD-10-CM | POA: Diagnosis present

## 2019-02-03 DIAGNOSIS — Z20828 Contact with and (suspected) exposure to other viral communicable diseases: Secondary | ICD-10-CM | POA: Diagnosis present

## 2019-02-03 DIAGNOSIS — M109 Gout, unspecified: Secondary | ICD-10-CM | POA: Diagnosis present

## 2019-02-03 DIAGNOSIS — I130 Hypertensive heart and chronic kidney disease with heart failure and stage 1 through stage 4 chronic kidney disease, or unspecified chronic kidney disease: Secondary | ICD-10-CM | POA: Diagnosis present

## 2019-02-03 DIAGNOSIS — Z823 Family history of stroke: Secondary | ICD-10-CM | POA: Diagnosis not present

## 2019-02-03 DIAGNOSIS — J9601 Acute respiratory failure with hypoxia: Secondary | ICD-10-CM | POA: Diagnosis present

## 2019-02-03 DIAGNOSIS — I4821 Permanent atrial fibrillation: Secondary | ICD-10-CM | POA: Diagnosis not present

## 2019-02-03 DIAGNOSIS — Z9889 Other specified postprocedural states: Secondary | ICD-10-CM | POA: Diagnosis not present

## 2019-02-03 DIAGNOSIS — I252 Old myocardial infarction: Secondary | ICD-10-CM | POA: Diagnosis not present

## 2019-02-03 DIAGNOSIS — I50813 Acute on chronic right heart failure: Secondary | ICD-10-CM | POA: Diagnosis not present

## 2019-02-03 DIAGNOSIS — I340 Nonrheumatic mitral (valve) insufficiency: Secondary | ICD-10-CM | POA: Diagnosis present

## 2019-02-03 DIAGNOSIS — Z833 Family history of diabetes mellitus: Secondary | ICD-10-CM | POA: Diagnosis not present

## 2019-02-03 DIAGNOSIS — R040 Epistaxis: Secondary | ICD-10-CM | POA: Diagnosis present

## 2019-02-03 DIAGNOSIS — I2729 Other secondary pulmonary hypertension: Secondary | ICD-10-CM | POA: Diagnosis present

## 2019-02-03 DIAGNOSIS — R269 Unspecified abnormalities of gait and mobility: Secondary | ICD-10-CM | POA: Diagnosis present

## 2019-02-03 DIAGNOSIS — I5043 Acute on chronic combined systolic (congestive) and diastolic (congestive) heart failure: Secondary | ICD-10-CM | POA: Diagnosis not present

## 2019-02-03 DIAGNOSIS — I495 Sick sinus syndrome: Secondary | ICD-10-CM | POA: Diagnosis present

## 2019-02-03 DIAGNOSIS — Z95810 Presence of automatic (implantable) cardiac defibrillator: Secondary | ICD-10-CM | POA: Diagnosis not present

## 2019-02-03 LAB — CBC
HCT: 26.8 % — ABNORMAL LOW (ref 36.0–46.0)
Hemoglobin: 9 g/dL — ABNORMAL LOW (ref 12.0–15.0)
MCH: 29.1 pg (ref 26.0–34.0)
MCHC: 33.6 g/dL (ref 30.0–36.0)
MCV: 86.7 fL (ref 80.0–100.0)
Platelets: 142 10*3/uL — ABNORMAL LOW (ref 150–400)
RBC: 3.09 MIL/uL — ABNORMAL LOW (ref 3.87–5.11)
RDW: 26.6 % — ABNORMAL HIGH (ref 11.5–15.5)
WBC: 4.1 10*3/uL (ref 4.0–10.5)
nRBC: 0.7 % — ABNORMAL HIGH (ref 0.0–0.2)

## 2019-02-03 LAB — ECHOCARDIOGRAM COMPLETE
Height: 61 in
Weight: 2176.38 oz

## 2019-02-03 LAB — BASIC METABOLIC PANEL
Anion gap: 9 (ref 5–15)
BUN: 37 mg/dL — ABNORMAL HIGH (ref 8–23)
CO2: 24 mmol/L (ref 22–32)
Calcium: 9.8 mg/dL (ref 8.9–10.3)
Chloride: 104 mmol/L (ref 98–111)
Creatinine, Ser: 1.47 mg/dL — ABNORMAL HIGH (ref 0.44–1.00)
GFR calc Af Amer: 39 mL/min — ABNORMAL LOW (ref >60)
GFR calc non Af Amer: 34 mL/min — ABNORMAL LOW (ref >60)
Glucose, Bld: 125 mg/dL — ABNORMAL HIGH (ref 70–99)
Potassium: 3.7 mmol/L (ref 3.5–5.1)
Sodium: 137 mmol/L (ref 135–145)

## 2019-02-03 LAB — PHOSPHORUS: Phosphorus: 3.7 mg/dL (ref 2.5–4.6)

## 2019-02-03 LAB — PROTIME-INR
INR: 2.2 — ABNORMAL HIGH (ref 0.8–1.2)
Prothrombin Time: 24.1 seconds — ABNORMAL HIGH (ref 11.4–15.2)

## 2019-02-03 LAB — PATHOLOGIST SMEAR REVIEW

## 2019-02-03 LAB — MAGNESIUM: Magnesium: 2.4 mg/dL (ref 1.7–2.4)

## 2019-02-03 MED ORDER — WARFARIN SODIUM 2.5 MG PO TABS: 2.5000 mg | ORAL_TABLET | ORAL | Status: DC

## 2019-02-03 MED ORDER — SPIRONOLACTONE 12.5 MG HALF TABLET
12.5000 mg | ORAL_TABLET | Freq: Every day | ORAL | Status: DC
  Administered 2019-02-04: 12.5 mg via ORAL
  Filled 2019-02-03 (×2): qty 1

## 2019-02-03 MED ORDER — WARFARIN - PHARMACIST DOSING INPATIENT
Freq: Every day | Status: DC
  Administered 2019-02-04 – 2019-02-05 (×2)

## 2019-02-03 MED ORDER — POLYVINYL ALCOHOL 1.4 % OP SOLN
1.0000 [drp] | OPHTHALMIC | Status: DC | PRN
  Administered 2019-02-03 – 2019-02-06 (×3): 1 [drp] via OPHTHALMIC
  Filled 2019-02-03: qty 15

## 2019-02-03 MED ORDER — FUROSEMIDE 10 MG/ML IJ SOLN
80.0000 mg | Freq: Two times a day (BID) | INTRAMUSCULAR | Status: DC
  Administered 2019-02-04 – 2019-02-05 (×4): 80 mg via INTRAVENOUS
  Filled 2019-02-03 (×4): qty 8

## 2019-02-03 MED ORDER — WARFARIN SODIUM 5 MG PO TABS
5.0000 mg | ORAL_TABLET | ORAL | Status: DC
  Administered 2019-02-03 – 2019-02-05 (×3): 5 mg via ORAL
  Filled 2019-02-03 (×4): qty 1

## 2019-02-03 NOTE — Progress Notes (Signed)
ANTICOAGULATION CONSULT NOTE - Initial Consult  Pharmacy Consult for coumadin Indication: MVR  Allergies  Allergen Reactions  . Codeine Nausea And Vomiting    Patient Measurements: Height: 5\' 1"  (154.9 cm) Weight: 136 lb 0.4 oz (61.7 kg) IBW/kg (Calculated) : 47.8   Vital Signs: Temp: 98.2 F (36.8 C) (05/15 0019) Temp Source: Oral (05/15 0019) BP: 112/68 (05/15 0019) Pulse Rate: 75 (05/15 0019)  Labs: Recent Labs    02/02/19 1619 02/02/19 1628  HGB 9.2*  --   HCT 28.4*  --   PLT 153  --   LABPROT  --  24.5*  INR  --  2.2*  CREATININE 1.42*  --   TROPONINI  --  0.05*    Estimated Creatinine Clearance: 28 mL/min (A) (by C-G formula based on SCr of 1.42 mg/dL (H)).   Medical History: Past Medical History:  Diagnosis Date  . Anticoagulated on Coumadin    for mech valve and atrial fib  goal 2.0-2.5  . Anxiety   . Arthritis    "right shoulder" (03/15/2013)  . Atrial fibrillation ([place])   . Atrial flutter ([place])   . CAD (coronary artery disease) 08/11/2016  . CHF (congestive heart failure) ([place])   . Chronic combined systolic and diastolic CHF (congestive heart failure) ([place])    a. 02/2016 Echo: EF 45-50%.  . Diverticula of colon   . Exertional shortness of breath   . GERD (gastroesophageal reflux disease)   . Heart murmur   . Hemorrhage intraabdominal 03/17/2012  . History of blood transfusion    "once" (03/15/2013)  . Hyperlipidemia   . Hypertension   . Liver hemorrhage   . Migraines   . 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 Bi-leaflet [employer] mechanical prosthesis; b. 02/2016 Echo: EF 45-50%, some degree of MR, sev dil LA/RA, sev TR.  . NSTEMI (non-ST elevated myocardial infarction) ([place])    a. 02/2016 elev trop/Cath: nonobs dzs,   . Pacemaker    medtronic adapta  . Pneumonia 2009   resolved.? OPD Rx  . Severe tricuspid regurgitation    a. 02/2016 Echo: Ef 45-50%, sev TR, PASP 27mmHg.  . Sick sinus syndrome ([place])    Dr Cristopher [place]. EP  study negative. Pacemaker 12/15/06 Medtronic  . Stroke [facility])    "they say I had a stroke last year" denies residual on 03/15/2013  . Subdural hematoma ([facility])     Medications:  See medication history  Assessment: 78 yo lady to continue coumadin for MVR.  INR on admission 2.2 Goal of Therapy:  INR 2-2.5 Monitor platelets by anticoagulation protocol: Yes   Plan:  F/u home coumadin dose Daily PT/INR  Carrie [name] 02/03/2019,12:28 AM

## 2019-02-03 NOTE — Discharge Instructions (Addendum)
Heart Failure Eating Plan Heart failure, also called congestive heart failure, occurs when your heart does not pump blood well enough to meet your body's needs for oxygen-rich blood. Heart failure is a long-term (chronic) condition. Living with heart failure can be challenging. However, following your health care provider's instructions about a healthy lifestyle and working with a diet and nutrition specialist (dietitian) to choose the right foods may help to improve your symptoms. What are tips for following this plan? General guidelines  Do not eat more than 2,300 mg of salt (sodium) a day. The amount of sodium that is recommended for you may be lower, depending on your condition.  Maintain a healthy body weight as directed. Ask your health care provider what a healthy weight is for you. ? Check your weight every day. ? Work with your health care provider and dietitian to make a plan that is right for you to lose weight or maintain your current weight.  Limit how much fluid you drink. Ask your health care provider or dietitian how much fluid you can have each day.  Limit or avoid alcohol as told by your health care provider or dietitian. Reading food labels  Check food labels for the amount of sodium per serving. Choose foods that have less than 140 mg (milligrams) of sodium in each serving.  Check food labels for the number of calories per serving. This is important if you need to limit your daily calorie intake to lose weight.  Check food labels for the serving size. If you eat more than one serving, you will be eating more sodium and calories than what is listed on the label.  Look for foods that are labeled as "sodium-free," "very low sodium," or "low sodium." ? Foods labeled as "reduced sodium" or "lightly salted" may still have more sodium than what is recommended for you. Cooking  Avoid adding salt when cooking. Ask your health care provider or dietitian before using salt  substitutes.  Season food with salt-free seasonings, spices, or herbs. Check the label of seasoning mixes to make sure they do not contain salt.  Cook with heart-healthy oils, such as olive, canola, soybean, or sunflower oil.  Do not fry foods. Cook foods using low-fat methods, such as baking, boiling, grilling, and broiling.  Limit unhealthy fats when cooking by: ? Removing the skin from poultry, such as chicken. ? Removing all visible fats from meats. ? Skimming the fat off from stews, soups, and gravies before serving them. Meal planning   Limit your intake of: ? Processed, canned, or pre-packaged foods. ? Foods that are high in trans fat, such as fried foods. ? Sweets, desserts, sugary drinks, and other foods with added sugar. ? Full-fat dairy products, such as whole milk.  Eat a balanced diet that includes: ? 4-5 servings of fruit each day and 4-5 servings of vegetables each day. At each meal, try to fill half of your plate with fruits and vegetables. ? Up to 6-8 servings of whole grains each day. ? Up to 2 servings of lean meat, poultry, or fish each day. One serving of meat is equal to 3 oz. This is about the same size as a deck of cards. ? 2 servings of low-fat dairy each day. ? Heart-healthy fats. Healthy fats called omega-3 fatty acids are found in foods such as flaxseed and cold-water fish like sardines, salmon, and mackerel.  Aim to eat 25-35 g (grams) of fiber a day. Foods that are high in fiber  include apples, broccoli, carrots, beans, peas, and whole grains.  Do not add salt or condiments that contain salt (such as soy sauce) to foods before eating.  When eating at a restaurant, ask that your food be prepared with less salt or no salt, if possible.  Try to eat 2 or more vegetarian meals each week.  Eat more home-cooked food and eat less restaurant, buffet, and fast food. Recommended foods The items listed may not be a complete list. Talk with your dietitian about  what dietary choices are best for you. Grains Bread with less than 80 mg of sodium per slice. Whole-wheat pasta, quinoa, and brown rice. Oats and oatmeal. Barley. [place]. Grits and cream of wheat. Whole-grain and whole-wheat cold cereal. Vegetables All fresh vegetables. Vegetables that are frozen without sauce or added salt. Low-sodium or sodium-free canned vegetables. Fruits All fresh, frozen, and canned fruits. Dried fruits, such as raisins, prunes, and cranberries. Meats and other protein foods Lean cuts of meat. Skinless chicken and [place]. Fish with high omega-3 fatty acids, such as salmon, sardines, and other cold-water fishes. Eggs. Dried beans, peas, and edamame. Unsalted nuts and nut butters. Dairy Low-fat or nonfat (skim) milk and dried milk. Rice milk, soy milk, and almond milk. Low-fat or nonfat yogurt. Small amounts of reduced-sodium block cheese. Low-sodium cottage cheese. Fats and oils Olive, canola, soybean, flaxseed, or sunflower oil. Avocado. Sweets and desserts Apple sauce. Granola bars. Sugar-free pudding and gelatin. Frozen fruit bars. Seasoning and other foods Fresh and dried herbs. Lemon or lime juice. Vinegar. Low-sodium ketchup. Salt-free marinades, salad dressings, sauces, and seasonings. Foods to avoid The items listed may not be a complete list. Talk with your dietitian about what dietary choices are best for you. Grains Bread with more than 80 mg of sodium per slice. Hot or cold cereal with more than 140 mg sodium per serving. Salted pretzels and crackers. Pre-packaged breadcrumbs. Bagels, croissants, and biscuits. Vegetables Canned vegetables. Frozen vegetables with sauce or seasonings. Creamed vegetables. [place] fries. Onion rings. Pickled vegetables and sauerkraut. Fruits Fruits that are dried with sodium-containing preservatives. Meats and other protein foods Ribs and chicken wings. Bacon, ham, pepperoni, bologna, salami, and packaged luncheon meats. Hot  dogs, bratwurst, and sausage. Canned meat. Smoked meat and fish. Salted nuts and seeds. Dairy Whole milk, half-and-half, and cream. Buttermilk. Processed cheese, cheese spreads, and cheese curds. Regular cottage cheese. Feta cheese. Shredded cheese. String cheese. Fats and oils Butter, lard, shortening, ghee, and bacon fat. Canned and packaged gravies. Seasoning and other foods Onion salt, garlic salt, table salt, and sea salt. Marinades. Regular salad dressings. Relishes, pickles, and olives. Meat flavorings and tenderizers, and bouillon cubes. Horseradish, ketchup, and mustard. Worcestershire sauce. Teriyaki sauce, soy sauce (including reduced sodium). Hot sauce and Tabasco sauce. Steak sauce, fish sauce, oyster sauce, and cocktail sauce. Taco seasonings. Barbecue sauce. [place] sauce. Summary  A heart failure eating plan includes changes that limit your intake of sodium and unhealthy fat, and it may help you lose weight or maintain a healthy weight. Your health care provider may also recommend limiting how much fluid you drink.  Most people with heart failure should eat no more than 2,300 mg of salt (sodium) a day. The amount of sodium that is recommended for you may be lower, depending on your condition.  Contact your health care provider or dietitian before making any major changes to your diet. This information is not intended to replace advice given to you by your health care provider. Make  sure you discuss any questions you have with your health care provider. Document Released: 01/22/2017 Document Revised: 01/22/2017 Document Reviewed: 01/22/2017 Elsevier Interactive Patient Education  2019 [place].   Heart Failure Action Plan A heart failure action plan helps you understand what to do when you have symptoms of heart failure. Follow the plan that was created by you and your health care provider. Review your plan each time you visit your health care provider. Red zone These signs  and symptoms mean you should get medical help right away:  You have trouble breathing when resting.  You have a dry cough that is getting worse.  You have swelling or pain in your legs or abdomen that is getting worse.  You suddenly gain more than 2-3 lb (0.9-1.4 kg) in a day, or more than 5 lb (2.3 kg) in one week. This amount may be more or less depending on your condition.  You have trouble staying awake or you feel confused.  You have chest pain.  You do not have an appetite.  You pass out. If you experience any of these symptoms:  Call your local emergency services (911 in the [place] right away or seek help at the emergency department of the nearest hospital. Yellow zone These signs and symptoms mean your condition may be getting worse and you should make some changes:  You have trouble breathing when you are active or you need to sleep with extra pillows.  You have swelling in your legs or abdomen.  You gain 2-3 lb (0.9-1.4 kg) in one day, or 5 lb (2.3 kg) in one week. This amount may be more or less depending on your condition.  You get tired easily.  You have trouble sleeping.  You have a dry cough. If you experience any of these symptoms:  Contact your health care provider within the next day.  Your health care provider may adjust your medicines. Green zone These signs mean you are doing well and can continue what you are doing:  You do not have shortness of breath.  You have very little swelling or no new swelling.  Your weight is stable (no gain or loss).  You have a normal activity level.  You do not have chest pain or any other new symptoms. Follow these instructions at home:  Take over-the-counter and prescription medicines only as told by your health care provider.  Weigh yourself daily. Your target weight is __________ lb (__________ kg). ? Call your health care provider if you gain more than __________ lb (__________ kg) in a day, or more than  __________ lb (__________ kg) in one week.  Eat a heart-healthy diet. Work with a diet and nutrition specialist (dietitian) to create an eating plan that is best for you.  Keep all follow-up visits as told by your health care provider. This is important. Where to find more information  American Heart Association: www.heart.org Summary  Follow the action plan that was created by you and your health care provider.  Get help right away if you have any symptoms in the Red zone. This information is not intended to replace advice given to you by your health care provider. Make sure you discuss any questions you have with your health care provider. Document Released: 10/17/2016 Document Revised: 05/12/2017 Document Reviewed: 10/17/2016 Elsevier Interactive Patient Education  2019 [place].   Heart Failure  Heart failure means your heart has trouble pumping blood. This makes it hard for your body to work well.  Heart failure is usually a long-term (chronic) condition. You must take good care of yourself and follow your treatment plan from your doctor. Follow these instructions at home: Medicines  Take over-the-counter and prescription medicines only as told by your doctor. ? Do not stop taking your medicine unless your doctor told you to do that. ? Do not skip any doses. ? Refill your prescriptions before you run out of medicine. You need your medicines every day. Eating and drinking   Eat heart-healthy foods. Talk with a diet and nutrition specialist (dietitian) to make an eating plan.  Choose foods that: ? Have no trans fat. ? Are low in saturated fat and cholesterol.  Choose healthy foods, like: ? Fresh or frozen fruits and vegetables. ? Fish. ? Low-fat (lean) meats. ? Legumes (like beans, peas, and lentils). ? Fat-free or low-fat dairy products. ? Whole-grain foods. ? High-fiber foods.  Limit salt (sodium) if told by your doctor. Ask your nutrition specialist to  recommend heart-healthy seasonings.  Cook in healthy ways instead of frying. Healthy ways of cooking include: ? Roasting. ? Grilling. ? Broiling. ? Baking. ? Poaching. ? Steaming. ? Stir-frying.  Limit how much fluid you drink, if told by your doctor. Lifestyle  Do not smoke or use chewing tobacco. Do not use nicotine gum or patches before talking to your doctor.  Limit alcohol intake to no more than 1 drink a day for non-pregnant women and 2 drinks a day for men. One drink equals 12 oz of beer, 5 oz of wine, or 1 oz of hard liquor. ? Tell your doctor if you drink alcohol many times a week. ? Talk with your doctor about whether any alcohol is safe for you. ? You should stop drinking alcohol: ? If your heart has been damaged by alcohol. ? You have very bad heart failure.  Do not use illegal drugs.  Lose weight if told by your doctor.  Do moderate physical activity if told by your doctor. Ask your doctor what activities are safe for you if: ? You are of older age (elderly). ? You have very bad heart failure. Keep track of important information  Weigh yourself every day. ? Weigh yourself every morning after you pee (urinate) and before breakfast. ? Wear the same amount of clothing each time. ? Write down your daily weight. Give your record to your doctor.  Check and write down your blood pressure as told by your doctor.  Check your pulse as told by your doctor. Dealing with heat and cold  If the weather is very hot: ? Avoid activity that takes a lot of energy. ? Use air conditioning or fans, or find a cooler place. ? Avoid caffeine. ? Avoid alcohol. ? Wear clothing that is loose-fitting, lightweight, and light-colored.  If the weather is very cold: ? Avoid activity that takes a lot of energy. ? Layer your clothes. ? Wear mittens or gloves, a hat, and a scarf when you go outside. ? Avoid alcohol. General instructions  Manage other conditions that you have as told  by your doctor.  Learn to manage stress. If you need help, ask your doctor.  Plan rest periods for when you get tired.  Get education and support as needed.  Get rehab (rehabilitation) to help you stay independent and to help with everyday tasks.  Stay up to date with shots (immunizations), especially pneumococcal and flu (influenza) shots.  Keep all follow-up visits as told by your doctor. This is important. Contact  a doctor if:  You gain weight quickly.  You are more short of breath than normal.  You cannot do your normal activities.  You tire easily.  You cough more than normal, especially with activity.  You have any or more puffiness (swelling) in areas such as your hands, feet, ankles, or belly (abdomen).  You cannot sleep because it is hard to breathe.  You feel like your heart is beating fast (palpitations).  You get dizzy or light-headed when you stand up. Get help right away if:  You have trouble breathing.  You or someone else notices a change in your awareness. This could be trouble staying awake or trouble concentrating.  You have chest pain or discomfort.  You pass out (faint). Summary  Heart failure means your heart has trouble pumping blood.  Make sure you refill your prescriptions before you run out of medicine. You need your medicines every day.  Keep records of your weight and blood pressure to give to your doctor.  Contact a doctor if you gain weight quickly. This information is not intended to replace advice given to you by your health care provider. Make sure you discuss any questions you have with your health care provider. Document Released: 06/16/2008 Document Revised: 06/01/2018 Document Reviewed: 09/29/2016 Elsevier Interactive Patient Education  2019 [employer] Inc[employer]   Heart Failure Exacerbation  Heart failure is a condition in which the heart does not fill up with enough blood, and therefore does not pump enough blood and oxygen to  the body. When this happens, parts of the body do not get the blood and oxygen they need to function properly. This can cause symptoms such as breathing problems, fatigue, swelling, and confusion. Heart failure exacerbation refers to heart failure symptoms that get worse. The symptoms may get worse suddenly or develop slowly over time. Heart failure exacerbation is a serious medical problem that should be treated right away. What are the causes? A heart failure exacerbation can be triggered by:  Not taking your heart failure medicines correctly.  Infections.  Eating an unhealthy diet or a diet that is high in salt (sodium).  Drinking too much fluid.  Drinking alcohol.  Taking illegal drugs, such as cocaine or methamphetamine.  Not exercising. Other causes include:  Other heart conditions such as an irregular heartbeat (arrhythmia).  Anemia.  Other medical problems, such as kidney failure. Sometimes the cause of the exacerbation is not known. What are the signs or symptoms? When heart failure symptoms suddenly or slowly get worse, this may be a sign of heart failure exacerbation. Symptoms of heart failure include:  Breathing problems or shortness of breath.  Chronic coughing or wheezing.  Fatigue.  Nausea or lack of appetite.  Feeling light-headed.  Confusion or memory loss.  Increased heart rate or irregular heartbeat.  Buildup of fluid in the legs, ankles, feet, or abdomen.  Difficulty breathing when lying down. How is this diagnosed? This condition is diagnosed based on:  Your symptoms and medical history.  A physical exam. You may also have tests, including:  Electrocardiogram (ECG). This test measures the electrical activity of your heart.  Echocardiogram. This test uses sound waves to take a picture of your heart to see how well it works.  Blood tests.  Imaging tests, such as: ? Chest X-ray. ? MRI. ? Ultrasound.  Stress test. This test examines  how well your heart functions when you exercise. Your heart is monitored while you exercise on a treadmill or exercise bike. If  you cannot exercise, medicines may be used to increase your heartbeat in place of exercise.  Cardiac catheterization. During this test, a thin, flexible tube (catheter) is inserted into a blood vessel and threaded up to your heart. This test allows your health care provider to check the arteries that lead to your heart (coronary arteries).  Right heart catheterization. During this test, the pressure in your heart is measured. How is this treated? This condition may be treated by:  Adjusting your heart medicines.  Maintaining a healthy lifestyle. This includes: ? Eating a heart-healthy diet that is low in sodium. ? Not using any products that contain nicotine or tobacco, such as cigarettes and e-cigarettes. ? Regular exercise. ? Monitoring your fluid intake. ? Monitoring your weight and reporting changes to your health care provider.  Treating sleep apnea, if you have this condition.  Surgery. This may include: ? Implanting a device that helps both sides of your heart contract at the same time (cardiac resynchronization therapy device). This can help with heart function and relieve heart failure symptoms. ? Implanting a device that can correct heart rhythm problems (implantable cardioverter defibrillator). ? Connecting a device to your heart to help it pump blood (ventricular assist device). ? Heart transplant. Follow these instructions at home: Medicines  Take over-the-counter and prescription medicines only as told by your health care provider.  Do not stop taking your medicines or change the amount you take. If you are having problems or side effects from your medicines, talk to your health care provider.  If you are having difficulty paying for your medicines, contact a social worker or your clinic. There are many programs to assist with medicine  costs.  Talk to your health care provider before starting any new medicines or supplements.  Make sure your health care provider and pharmacist have a list of all the medicines you are taking. Eating and drinking   Avoid drinking alcohol.  Eat a heart-healthy diet as told by your health care provider. This includes: ? Plenty of fruits and vegetables. ? Lean proteins. ? Low-fat dairy. ? Whole grains. ? Foods that are low in sodium. Activity   Exercise regularly as told by your health care provider. Balance exercise with rest.  Ask your health care provider what activities are safe for you. This includes sexual activity, exercise, and daily tasks at home or work. Lifestyle  Do not use any products that contain nicotine or tobacco, such as cigarettes and e-cigarettes. If you need help quitting, ask your health care provider.  Maintain a healthy weight. Ask your health care provider what weight is healthy for you.  Consider joining a patient support group. This can help with emotional problems you may have, such as stress and anxiety. General instructions  Talk to your health care provider about flu and pneumonia vaccines.  Keep a list of medicines that you are taking. This may help in emergency situations.  Keep all follow-up visits as told by your health care provider. This is important. Contact a health care provider if:  You have questions about your medicines or you miss a dose.  You feel anxious, depressed, or stressed.  You have swelling in your feet, ankles, legs, or abdomen.  You have shortness of breath during activity or exercise.  You have a cough.  You have a fever.  You have trouble sleeping.  You gain 2-3 lb (1-1.4 kg) in 24 hours or 5 lb (2.3 kg) in a week. Get help right away if:  You have chest pain.  You have shortness of breath while resting.  You have severe fatigue.  You are confused.  You have severe dizziness.  You have a rapid or  irregular heartbeat.  You have nausea or you vomit.  You have a cough that is worse at night or you cannot lie flat.  You have a cough that will not go away.  You have severe depression or sadness. Summary  When heart failure symptoms get worse, it is called heart failure exacerbation.  Common causes of this condition include taking medicines incorrectly, infections, and drinking alcohol.  This condition may be treated by adjusting medicines, maintaining a healthy lifestyle, or surgery.  Do not stop taking your medicines or change the amount you take. If you are having problems or side effects from your medicines, talk to your health care provider. This information is not intended to replace advice given to you by your health care provider. Make sure you discuss any questions you have with your health care provider. Document Released: 01/19/2017 Document Revised: 01/19/2017 Document Reviewed: 01/19/2017 Elsevier Interactive Patient Education  2019 [place] With Heart Failure  Heart failure is a long-term (chronic) condition in which the heart cannot pump enough blood through the body. When this happens, parts of the body do not get the blood and oxygen they need. There is no cure for heart failure at this time, so it is important for you to take good care of yourself and follow the treatment plan set by your health care provider. If you are living with heart failure, there are ways to help you manage the disease. Follow these instructions at home: Living with heart failure requires you to make changes in your life. Your health care team will teach you about the changes you need to make in order to relieve your symptoms and lower your risk of going to the hospital. Follow the treatment plan as set by your health care provider. Medicines Medicines are important in reducing your heart's workload, slowing the progression of heart failure, and improving your symptoms.  Take  over-the-counter and prescription medicines only as told by your health care provider.  Do not stop taking your medicine unless your health care provider tells you to do that.  Do not skip any dose of your medicine.  Refill prescriptions before you run out of medicine. You need your medicines every day. Eating and drinking   Eat heart-healthy foods. Talk with a dietitian to make an eating plan that is right for you. ? If directed by your health care provider: ? Limit salt (sodium). Lowering your sodium intake may reduce symptoms of heart failure. Ask a dietitian to recommend heart-healthy seasonings. ? Limit your fluid intake. Fluid restriction may reduce symptoms of heart failure. ? Use low-fat cooking methods instead of frying. Low-fat methods include roasting, grilling, broiling, baking, poaching, steaming, and stir-frying. ? Choose foods that contain no trans fat and are low in saturated fat and cholesterol. Healthy choices include fresh or frozen fruits and vegetables, fish, lean meats, legumes, fat-free or low-fat dairy products, and whole-grain or high-fiber foods.  Limit alcohol intake to no more than 1 drink a day for nonpregnant women and 2 drinks a day for men. One drink equals 12 oz of beer, 5 oz of wine, or 1 oz of hard liquor. ? Drinking more than that is harmful to your heart. Tell your health care provider if you drink alcohol several times a week. ?  Talk with your health care provider about whether any level of alcohol use is safe for you. Activity   Ask your health care provider about attending cardiac rehabilitation. These programs include aerobic physical activity, which provides many benefits for your heart.  If no cardiac rehabilitation program is available, ask your health care provider what aerobic exercises are safe for you to do. Lifestyle Make the lifestyle changes recommended by your health care provider. In general:  Lose weight if your health care provider  tells you to do that. Weight loss may reduce symptoms of heart failure.  Do not use any products that contain nicotine or tobacco, such as cigarettes or e-cigarettes. If you need help quitting, ask your health care provider.  Do not use street (illegal) drugs.  Return to your normal activities as told by your health care provider. Ask your health care provider what activities are safe for you. General instructions   Make sure you weigh yourself every day to track your weight. Rapid weight gain may indicate an increase in fluid in your body and may increase the workload of your heart. ? Weigh yourself every morning. Do this after you urinate but before you eat breakfast. ? Wear the same type of clothing, without shoes, each time you weigh yourself. ? Weigh yourself on the same scale and in the same spot each time.  Living with chronic heart failure often leads to emotions such as fear, stress, anxiety, and depression. If you feel any of these emotions and need help coping, contact your health care provider. Other ways to get help include: ? Talking to friends and family members about your condition. They can give you support and guidance. Explain your symptoms to them and, if comfortable, invite them to attend appointments or rehabilitation with you. ? Joining a support group for people with chronic heart failure. Talking with other people who have the same symptoms may give you new ways of coping with your disease and your emotions.  Stay up to date with your shots (vaccines). Staying current on pneumococcal and influenza vaccines is especially important in preventing germs from attacking your airways (respiratory infections).  Keep all follow-up visits as told by your health care provider. This is important. How to recognize changes in your condition You and your family members need to know what changes to watch for in your condition. Watch for the following changes and report them to your  health care provider:  Sudden weight gain. Ask your health care provider what amount of weight gain to report.  Shortness of breath: ? Feeling short of breath while at rest, with no exercise or activity that required great effort. ? Feeling breathless with activity.  Swelling of your lower legs or ankles.  Difficulty sleeping: ? You wake up feeling short of breath. ? You have to use more pillows to raise your head in order to sleep.  Frequent, dry, hacking cough.  Loss of appetite.  Feeling more tired all the time.  Depression or feelings of sadness or hopelessness.  Bloating in the stomach. Where to find more information  Local support groups. Ask your health care provider about groups near you.  The American Heart Association: www.heart.org Contact a health care provider if:  You have a rapid weight gain.  You have increasing shortness of breath that is unusual for you.  You are unable to participate in your usual physical activities.  You tire easily.  You cough more than normal, especially with physical activity.  You have any swelling or more swelling in areas such as your hands, feet, ankles, or abdomen.  You feel like your heart is beating quickly (palpitations).  You become dizzy or light-headed when you stand up. Get help right away if:  You have difficulty breathing.  You notice or your family notices a change in your awareness, such as having trouble staying awake or having difficulty with concentration.  You have pain or discomfort in your chest.  You have an episode of fainting (syncope). Summary  There is no cure for heart failure, so it is important for you to take good care of yourself and follow the treatment plan set by your health care provider.  Medicines are important in reducing your heart's workload, slowing the progression of heart failure, and improving your symptoms.  Living with chronic heart failure often leads to emotions such  as fear, stress, anxiety, and depression. If you are feeling any of these emotions and need help coping, contact your health care provider. This information is not intended to replace advice given to you by your health care provider. Make sure you discuss any questions you have with your health care provider. Document Released: 01/20/2017 Document Revised: 01/20/2017 Document Reviewed: 01/20/2017 Elsevier Interactive Patient Education  2019 [employer].   Shortness of Breath, Adult Shortness of breath means you have trouble breathing. Shortness of breath could be a sign of a medical problem. Follow these instructions at home:   Watch for any changes in your symptoms.  Do not use any products that contain nicotine or tobacco, such as cigarettes, e-cigarettes, and chewing tobacco.  Do not smoke. Smoking can cause shortness of breath. If you need help to quit smoking, ask your doctor.  Avoid things that can make it harder to breathe, such as: ? Mold. ? Dust. ? Air pollution. ? Chemical smells. ? Things that can cause allergy symptoms (allergens), if you have allergies.  Keep your living space clean. Use products that help remove mold and dust.  Rest as needed. Slowly return to your normal activities.  Take over-the-counter and prescription medicines only as told by your doctor. This includes oxygen therapy and inhaled medicines.  Keep all follow-up visits as told by your doctor. This is important. Contact a doctor if:  Your condition does not get better as soon as expected.  You have a hard time doing your normal activities, even after you rest.  You have new symptoms. Get help right away if:  Your shortness of breath gets worse.  You have trouble breathing when you are resting.  You feel light-headed or you pass out (faint).  You have a cough that is not helped by medicines.  You cough up blood.  You have pain with breathing.  You have pain in your chest, arms,  shoulders, or belly (abdomen).  You have a fever.  You cannot walk up stairs.  You cannot exercise the way you normally do. These symptoms may represent a serious problem that is an emergency. Do not wait to see if the symptoms will go away. Get medical help right away. Call your local emergency services (911 in the [place] Do not drive yourself to the hospital. Summary  Shortness of breath is when you have trouble breathing enough air. It can be a sign of a medical problem.  Avoid things that make it hard for you to breathe, such as smoking, pollution, mold, and dust.  Watch for any changes in your symptoms. Contact your doctor if  you do not get better or you get worse. This information is not intended to replace advice given to you by your health care provider. Make sure you discuss any questions you have with your health care provider. Document Released: 02/24/2008 Document Revised: 02/07/2018 Document Reviewed: 02/07/2018 Elsevier Interactive Patient Education  2019 [place] Lip Breathing Pursed lip breathing is a technique to relieve the feeling of being short of breath. Some long-term respiratory conditions, like chronic obstructive pulmonary disease (COPD) and severe asthma, can make it hard to breathe out (exhale) all of the air in your lungs. This can make air that has less oxygen than normal build up in your lungs (air trapping). Trapped air means your lungs fill with less fresh air when you breathe in (inhale). As a result, you feel short of breath. Pursed lip breathing keeps your airways open longer when you exhale and empties more air from your lungs. This makes more space for fresh air when you inhale. Pursed lip breathing can also slow down your breathing and help your body not have to work so hard to breathe. Over time, pursed lip breathing may help you be able to be more physically active and do more activities. How to perform pursed lip breathing Being short of  breath can make you tense and anxious. Before you start this breathing exercise, take a minute to relax your shoulders and close your eyes. Then: 1. Start the exercise by closing your mouth. 2. Breathe in through your nose, taking a normal breath. You can do this at your normal rate of breathing. If you feel you are not getting enough air, breathe in while slowly counting to 2 or 3. 3. Pucker (purse) your lips as if you were going to whistle. 4. Gently tighten your abdomen muscles or press on your belly to help push the air out. 5. Breathe out slowly through your pursed lips. Take at least twice as long to breathe out as it takes you to breathe in. 6. Make sure that you breathe out all of the air, but do not force air out. 7. Repeat the exercise until your breathing improves. Ask your health care provider how often and how long to do this exercise. Follow these instructions at home:  Take over-the-counter and prescription medicines only as told by your health care provider.  Return to your normal activities as told by your health care provider. Ask your health care provider what activities are safe for you.  Do not use any products that contain nicotine or tobacco, such as cigarettes and e-cigarettes. If you need help quitting, ask your health care provider.  Keep all follow-up visits as told by your health care provider. This is important. Contact a health care provider if:  Your shortness of breath gets worse.  You become less able to exercise or be active.  You develop a cough.  You develop a fever. Get help right away if:  You are struggling to breathe.  Your shortness of breath prevents you from engaging in any activity. Summary  Pursed lip breathing is a breathing technique that helps to remove trapped air from your lungs. It helps you get more oxygen into your lungs and makes your body have to work less hard to breathe.  Pursed lip breathing can gradually make you more able  to be physically active.  You can do pursed lip breathing on your own at home.  Ask your health care provider how often and how long  you should do pursed lip breathing. This information is not intended to replace advice given to you by your health care provider. Make sure you discuss any questions you have with your health care provider. Document Released: 06/16/2008 Document Revised: 07/30/2016 Document Reviewed: 07/30/2016 Elsevier Interactive Patient Education  2019 [place] With Heart Failure Caregivers provide physical and emotional support to friends or family members with heart failure. If you are supporting someone who has heart failure, you play an important role in making sure that the person follows schedules, takes medicines, and follows the overall treatment and rehabilitation plan. What do I need to know about heart failure? Heart failure is a serious condition. It means that the heart cannot fill or pump enough blood and oxygen to support the body and its functions. This can cause damage to many parts of the body, including important organs.  The person with heart failure may:  Need to take medicines on a regular basis.  Need to have some procedures or surgery.  Require rehabilitation treatment.  Need to make changes in: ? Lifestyle. ? Diet. ? Physical activity and exercise. Planning for discharge from the hospital Privacy laws require that your friend or family member give you permission to hear private health information before this information will be shared with you. Talk with the health care provider about these rules. Before your friend or family member leaves the hospital after treatment, make sure you understand:  The recovery process after heart failure.  Physical, emotional, behavior, and other changes that occur in the person with heart failure.  The medicines that the person with heart failure has to take and when to take  them.  Whether you will need extra help at home.  The treatments for heart failure.  Changes you have to make at home to make the person safe.  How to lower the person's risk of having other heart problems.  Diet and exercise changes for the person with heart failure.  Whether the person with heart failure will need help using the bathroom, bathing, eating, or doing other activities.  Whether you need to get certain devices or equipment for the person with heart failure.  That heart failure requires emergency medical care if symptoms appear. Caring for yourself It is normal to have many different emotions while caring for someone with heart failure. The lifestyle changes that come with this diagnosis can be stressful and difficult. As a caregiver, you need to:  Monitor yourself for signs of emotional, physical, and mental exhaustion (caregiver burnout). This can happen if you feel overwhelmed.  Take care of yourself so that you can better meet the needs of your loved one and also meet your own needs. Signs of caregiver burnout  Sleep problems.  Difficulty concentrating.  Changes in appetite.  Depressed mood and mood swings.  Feeling emotionally out of control.  Feeling unmotivated to do anything.  Neglecting your loved one, including forgetting to give medicines or keep appointments. Ways to care for yourself   Find a local support group.  Ask friends and family for help.  Get counseling or therapy.  Ask a health care provider for help.  Take time for yourself.  Relax, exercise, and eat healthy. What support is available?  Ask your health care provider for help.  Think about taking classes to learn more about supporting someone with heart failure.  Take a class to become certified in CPR.  Think about joining a support group for people  who are caring for someone with heart failure. It is possible to find such a group in your local community.  Search for  information online from trusted sources such as the American Heart Association: www.heart.org Summary  Before you leave the hospital, make sure you understand how to care for someone with heart failure.  It is normal to have many different emotions while caring for someone with heart failure.  Take care of yourself by asking for help and taking time to relax.  Monitor yourself for caregiver burnout and know when to ask for help. This information is not intended to replace advice given to you by your health care provider. Make sure you discuss any questions you have with your health care provider. Document Released: 01/19/2017 Document Revised: 01/19/2017 Document Reviewed: 01/19/2017 Elsevier Interactive Patient Education  2019 [place] on my medicine - Coumadin   (Warfarin)  This medication education was reviewed with me or my healthcare representative as part of my discharge preparation.  The pharmacist that spoke with me during my hospital stay was:  Wayland Salinas, [facility]  Why was Coumadin prescribed for you? Coumadin was prescribed for you because you have a blood clot or a medical condition that can cause an increased risk of forming blood clots. Blood clots can cause serious health problems by blocking the flow of blood to the heart, lung, or brain. Coumadin can prevent harmful blood clots from forming. As a reminder your indication for Coumadin is:   Blood Clot Prevention After Heart Valve Surgery  What test will check on my response to Coumadin? While on Coumadin (warfarin) you will need to have an INR test regularly to ensure that your dose is keeping you in the desired range. The INR (international normalized ratio) number is calculated from the result of the laboratory test called prothrombin time (PT).  If an INR APPOINTMENT HAS NOT ALREADY BEEN MADE FOR YOU please schedule an appointment to have this lab work done by your health care provider  within 7 days. Your INR goal is usually a number between:  2 to 3 or your provider may give you a more narrow range like 2-2.5.  Ask your health care provider during an office visit what your goal INR is.  What  do you need to  know  About  COUMADIN? Take Coumadin (warfarin) exactly as prescribed by your healthcare provider about the same time each day.  DO NOT stop taking without talking to the doctor who prescribed the medication.  Stopping without other blood clot prevention medication to take the place of Coumadin may increase your risk of developing a new clot or stroke.  Get refills before you run out.  What do you do if you miss a dose? If you miss a dose, take it as soon as you remember on the same day then continue your regularly scheduled regimen the next day.  Do not take two doses of Coumadin at the same time.  Important Safety Information A possible side effect of Coumadin (Warfarin) is an increased risk of bleeding. You should call your healthcare provider right away if you experience any of the following: ? Bleeding from an injury or your nose that does not stop. ? Unusual colored urine (red or dark brown) or unusual colored stools (red or black). ? Unusual bruising for unknown reasons. ? A serious fall or if you hit your head (even if there is no bleeding).  Some foods or medicines interact with Coumadin (warfarin)  and might alter your response to warfarin. To help avoid this: ? Eat a balanced diet, maintaining a consistent amount of Vitamin K. ? Notify your provider about major diet changes you plan to make. ? Avoid alcohol or limit your intake to 1 drink for women and 2 drinks for men per day. (1 drink is 5 oz. wine, 12 oz. beer, or 1.5 oz. liquor.)  Make sure that ANY health care provider who prescribes medication for you knows that you are taking Coumadin (warfarin).  Also make sure the healthcare provider who is monitoring your Coumadin knows when you have started a new  medication including herbals and non-prescription products.  Coumadin (Warfarin)  Major Drug Interactions  Increased Warfarin Effect Decreased Warfarin Effect  Alcohol (large quantities) Antibiotics (esp. Septra/Bactrim, Flagyl, Cipro) Amiodarone (Cordarone) Aspirin (ASA) Cimetidine (Tagamet) Megestrol (Megace) NSAIDs (ibuprofen, naproxen, etc.) Piroxicam (Feldene) Propafenone (Rythmol SR) Propranolol (Inderal) Isoniazid (INH) Posaconazole (Noxafil) Barbiturates (Phenobarbital) Carbamazepine (Tegretol) Chlordiazepoxide (Librium) Cholestyramine (Questran) Griseofulvin Oral Contraceptives Rifampin Sucralfate (Carafate) Vitamin K   Coumadin (Warfarin) Major Herbal Interactions  Increased Warfarin Effect Decreased Warfarin Effect  Garlic Ginseng Ginkgo biloba Coenzyme Q10 Green tea St. Johns wort    Coumadin (Warfarin) FOOD Interactions  Eat a consistent number of servings per week of foods HIGH in Vitamin K (1 serving =  cup)  Collards (cooked, or boiled & drained) Kale (cooked, or boiled & drained) Mustard greens (cooked, or boiled & drained) Parsley *serving size only =  cup Spinach (cooked, or boiled & drained) Swiss chard (cooked, or boiled & drained) Turnip greens (cooked, or boiled & drained)  Eat a consistent number of servings per week of foods MEDIUM-HIGH in Vitamin K (1 serving = 1 cup)  Asparagus (cooked, or boiled & drained) Broccoli (cooked, boiled & drained, or raw & chopped) Brussel sprouts (cooked, or boiled & drained) *serving size only =  cup Lettuce, raw (green leaf, endive, romaine) Spinach, raw Turnip greens, raw & chopped   These websites have more information on Coumadin (warfarin):  FailFactory.se; VeganReport.com.au;

## 2019-02-03 NOTE — Consult Note (Signed)
Cardiology Consult    Patient ID: Carrie [name] MRN: [id number], DOB/AGE: [birth year]   Admit date: 02/02/2019 Date of Consult: 02/03/2019  Primary Physician: Lucianne Lei, MD Primary Cardiologist: [name], MD Requesting Provider: Irene Pap, DO  Patient Profile    Carrie [name] is a 78 y.o. 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 Nevada Crane (Internal Medicine).  History of Present Illness    Carrie [name] is a 78 year old female with the above history who is followed by Dr. Sallyanne Kuster. She last saw Dr. Sallyanne Kuster via a telehealth visit on 01/11/2019 at which time patient reported substantial lower extremity edema that made it difficult to climb stairs as well as a 12 lb weight gain but denies any dyspnea or orthopnea. Therefore, small supply of PRN Metolazone was ordered. Patient reported improvement after 2 doses of Metolazone but called our office again on 01/14/2019 reporting her weight was up although lower extremity edema seemed slightly better. She was advised to take another Metolazone 2.5mg  tablet and follow urine output/weights.   Patient presented to the ED yesterday for evaluation of shortness of breath, weakness, and lower extremity edema. Patient reports increasing dyspnea on exertion, lower extremity edema, and about a 13 lb weight gain over the past 2 weeks. She denies any orthopnea, PND, chest pain, or palpitations. She does state she had an episode last week where she felt faint. She took 3 doses of sublingual Nitroglycerin for this because she thought that  might help and her symptoms actually improved. She denies any syncope. No recent fevers, chills, body aches, or cough. No known exposure to the coronavirus.  In the ED, vitals stable. EKG reportedly showed rate controlled atrial fibrillation (unable to personally review remotely). Troponin minimally elevated at 0.05. Chest x-ray showed persistent severe cardiomegaly with mild vascular congestion and an opacity at the right lung base felt to represent a combination of a small right-sided pleural effusion and atelectasis. BNP elevated at 2,329.2. WBC 3.9, Hgb 9.2, Plts 153. Na 137, K 4.5, Glucose 106, SCr 1.42. COVID-19 testing negative. Patient was admitted for further evaluation.   Patient reports breathing has improved and she has urinated a lot since being started on IV Lasix.  Echo with normal LV function. Well functioning MVR. RV is dilated with moderate HK and evidence of RV pressure volume overload.Marland Kitchen RVSP estimated at 45mm HG but suspect it is higher.   Past Medical History   Past Medical History:  Diagnosis Date   Anticoagulated on Coumadin    for mech valve and atrial fib  goal 2.0-2.5   Anxiety    Arthritis    "right shoulder" (03/15/2013)   Atrial fibrillation (HCC)    Atrial flutter (HCC)    CAD (coronary artery disease) 08/11/2016   CHF (congestive heart failure) (HCC)    Chronic combined systolic and diastolic CHF (congestive heart failure) ([place])    a. 02/2016 Echo: EF 45-50%.   Diverticula of colon    Exertional shortness of breath    GERD (gastroesophageal reflux disease)    Heart murmur  Hemorrhage intraabdominal 03/17/2012   History of blood transfusion    "once" (03/15/2013)   Hyperlipidemia    Hypertension    Liver hemorrhage    Migraines    Mitral valve regurgitation, rheumatic 11/19/2011   a. Bi-leaflet St. Jude mechanical prosthesis; b. 02/2016 Echo: EF 45-50%, some degree of MR, sev dil LA/RA, sev TR.   NSTEMI (non-ST elevated myocardial  infarction) ([place])    a. 02/2016 elev trop/Cath: nonobs dzs,    Pacemaker    medtronic adapta   Pneumonia 2009   resolved.? OPD Rx   Severe tricuspid regurgitation    a. 02/2016 Echo: Ef 45-50%, sev TR, PASP 59mmHg.   Sick sinus syndrome (HCC)    Dr Cristopher Peru. EP study negative. Pacemaker 12/15/06 Medtronic   Stroke [facility])    "they say I had a stroke last year" denies residual on 03/15/2013   Subdural hematoma [facility])     Past Surgical History:  Procedure Laterality Date   APPENDECTOMY     CARDIAC CATHETERIZATION  04/02/97   R&L:severe MR/pulmonary hypertension   CARDIAC CATHETERIZATION N/A 03/16/2016   Procedure: Left Heart Cath and Coronary Angiography;  Surgeon: [name], MD;  Location: [place] [facility];  Service: Cardiovascular;  Laterality: N/A;   CARDIAC CATHETERIZATION N/A 09/30/2016   Procedure: Left Heart Cath and Coronary Angiography;  Surgeon: [name], MD;  Location: [place] [facility];  Service: Cardiovascular;  Laterality: N/A;   CARDIAC CATHETERIZATION N/A 09/30/2016   Procedure: Coronary Stent Intervention;  Surgeon: [name], MD;  3.5 x 12 mm resolute Onyx  to ostial RCA   CATARACT EXTRACTION W/ INTRAOCULAR LENS  IMPLANT, BILATERAL Bilateral 2013   CORONARY ANGIOPLASTY     DILATION AND CURETTAGE OF UTERUS     "had fibroids" (03/15/2013)   EXPLORATORY LAPAROTOMY     "had a growth on my intestines" (03/15/2013)   HAMMER TOE SURGERY Right    INSERT / REPLACE / REMOVE PACEMAKER  12/15/2006   Medtronic   MITRAL VALVE REPLACEMENT  1998   St Jude mechanical; Dr. [name]   PACEMAKER PLACEMENT  12/15/06   medtronic adapta for SSS   PERMANENT PACEMAKER GENERATOR CHANGE N/A 07/24/2014   Procedure: PERMANENT PACEMAKER GENERATOR CHANGE;  Surgeon: [name], MD;  Location: [place] [facility];  Service: Cardiovascular;  Laterality: N/A;   PERSANTINE CARDIOLITE  08/07/03   mild inf. ischemia    TEE WITHOUT CARDIOVERSION  09/23/2011    Procedure: TRANSESOPHAGEAL ECHOCARDIOGRAM (TEE);  Surgeon: [name];  Location: [facility];  Service: Cardiovascular;  Laterality: N/A;   TONSILLECTOMY     TUBAL LIGATION     US ECHOCARDIOGRAPHY  11/19/2011   EF 50-55%,RA mod to severely dilated,LA severely dilated,trace MR,small vegetation or mass on the MV,AOV mildly scleroticmild PI, RV pressure 40-62mmHg     Allergies  Allergies  Allergen Reactions   Codeine Nausea And Vomiting    Inpatient Medications     atorvastatin  40 mg Oral q1800   docusate sodium  200 mg Oral BID   furosemide  60 mg Intravenous BID   metoprolol tartrate  25 mg Oral BID   [START ON 02/09/2019] warfarin  2.5 mg Oral Once per day on Thu   warfarin  5 mg Oral Once per day on Sun Tue Wed Fri Sat   Warfarin - Pharmacist Dosing Inpatient   Does not apply q1800    Family History    Family History  Problem Relation Age of Onset  Cancer Mother    Stroke Father    Cancer Sister    Leukemia Sister    Healthy Brother    Healthy Sister    Diabetes Sister    Healthy Brother    Healthy Brother    Heart attack Maternal Grandfather    Kidney disease Daughter    Stroke Brother    She indicated that her mother is deceased. She indicated that her father is deceased. She indicated that two of her three sisters are alive. She indicated that three of her four brothers are alive. She indicated that the status of her maternal grandfather is unknown. She indicated that the status of her daughter is unknown.   Social History    Social History   Socioeconomic History   Marital status: Married    Spouse name: Not on file   Number of children: Not on file   Years of education: Not on file   Highest education level: Not on file  Occupational History   Not on file  Social Needs   Financial resource strain: Not on file   Food insecurity:    Worry: Not on file    Inability: Not on file   Transportation needs:    Medical:  Not on file    Non-medical: Not on file  Tobacco Use   Smoking status: Never Smoker   Smokeless tobacco: Never Used  Substance and Sexual Activity   Alcohol use: No   Drug use: No   Sexual activity: Never  Lifestyle   Physical activity:    Days per week: Not on file    Minutes per session: Not on file   Stress: Not on file  Relationships   Social connections:    Talks on phone: Not on file    Gets together: Not on file    Attends religious service: Not on file    Active member of club or organization: Not on file    Attends meetings of clubs or organizations: Not on file    Relationship status: Not on file   Intimate partner violence:    Fear of current or ex partner: Not on file    Emotionally abused: Not on file    Physically abused: Not on file    Forced sexual activity: Not on file  Other Topics Concern   Not on file  Social History Narrative   Not on file     Review of Systems    Review of Systems  Constitutional: Negative for chills and fever.  HENT: Negative for congestion and sore throat.   Respiratory: Positive for shortness of breath. Negative for cough and hemoptysis.   Cardiovascular: Positive for leg swelling. Negative for chest pain, palpitations, orthopnea and PND.  Gastrointestinal: Negative for blood in stool, nausea and vomiting.  Genitourinary: Negative for hematuria.  Musculoskeletal: Negative for myalgias.  Neurological: Negative for loss of consciousness.  Endo/Heme/Allergies: Does not bruise/bleed easily.  Psychiatric/Behavioral: Negative for substance abuse.    Physical Exam    Blood pressure 106/60, pulse 72, temperature 98.8 F (37.1 C), temperature source Oral, resp. rate 19, height 5\' 1"  (1.549 m), weight 61.7 kg, SpO2 94 %.    Physical Exam per MD:   Labs    Troponin (Point of Care Test) No results for input(s): TROPIPOC in the last 72 hours. Recent Labs    02/02/19 1628  TROPONINI 0.05*   Lab Results    Component Value Date   WBC 4.1 02/03/2019   HGB 9.0 (  L) 02/03/2019   HCT 26.8 (L) 02/03/2019   MCV 86.7 02/03/2019   PLT 142 (L) 02/03/2019    Recent Labs  Lab 02/03/19 0111  NA 137  K 3.7  CL 104  CO2 24  BUN 37*  CREATININE 1.47*  CALCIUM 9.8  GLUCOSE 125*   Lab Results  Component Value Date   CHOL 87 03/31/2018   HDL 31 (L) 03/31/2018   LDLCALC 47 03/31/2018   TRIG 46 03/31/2018   No results found for: [facility]   Radiology Studies    Dg Chest 2 View  Result Date: 02/02/2019 CLINICAL DATA:  Shortness of breath EXAM: CHEST - 2 VIEW COMPARISON:  03/30/2018 CT chest.  Chest x-ray dated 07/10 2019. FINDINGS: Again identified is massive cardiomegaly. The patient is status post prior median sternotomy. There is a persistent airspace opacity at the right lung base favored to represent a combination of atelectasis and a small right-sided pleural effusion. There is scarring at the right lung base. The left lung field is mostly clear. There is no pneumothorax. There is vascular congestion without overt pulmonary edema. IMPRESSION: 1. Persistent severe cardiomegaly. 2. Opacity at the right lung base favored to represent a combination of a small right-sided pleural effusion and atelectasis. An underlying infiltrate is difficult to exclude but overall this appearance is minimally changed from chest x-ray dated 03/30/2018. 3. No pneumothorax.  Mild vascular congestion. Electronically Signed   By: Constance Holster M.D.   On: 02/02/2019 15:30    EKG     EKG: EKG was personally reviewed and demonstrates: AF with v-pacing   Telemetry: Telemetry was personally reviewed and demonstrates: AF 80-90s  Personally reviewed   Cardiac Imaging    Echocardiogram 03/31/2018: Study Conclusions: - Left ventricle: Septal flattening consistent with RV volume /   pressure overload. The cavity size was normal. Wall thickness was   normal. Systolic function was normal. The estimated ejection    fraction was in the range of 60% to 65%. - Aortic valve: There was mild regurgitation. - Mitral valve: MV prosthesis is well seated Motion is difficult to   see Peak and mean gradients through the valve are 19 and 6 mm Hg   respectively. Valve area by continuity equation (using LVOT   flow): 1.21 cm^2. - Left atrium: The atrium was severely dilated. - Right ventricle: The cavity size was moderately dilated. Systolic   function was mildly reduced. - Right atrium: The atrium was severely dilated. - Tricuspid valve: There was severe regurgitation. - Pulmonary arteries: PA peak pressure: 66 mm Hg (S). _______________  Myoview 04/29/2017:  The left ventricular ejection fraction is hyperdynamic (>65%).  Nuclear stress EF: 67%.  There was no ST segment deviation noted during stress.  The study is normal.  This is a low risk study.   Normal pharmacologic nuclear stress test with no evidence for prior infarct or ischemia.  Assessment & Plan    Acute on Chronic Diastolic CHF - Patient presented with shortness of breath, weakness, and lower extremity edema over the past 2 weeks. - BNP elevated > 2,000. - Echo pending. - Currently being diuresed with IV Lasix 60mg  twice daily. Documented output of 3 L in the past 24 hours.  - Will defer any changes to diuresis to MD who will assess volume status.  - Continue home beta-blocker. - Continue to monitor daily weight, strict I/O's, and renal function.  Right Heart Failure - Most recent Echo from 03/2018 showed moderately dilated RV with mildly  reduced systolic function.  - Felt to be consequence of fixed pulmonary hypertension, which is likely due to long-standing rheumatic mitral valve disease, compounded by severe tricuspid regurgitation. - Current Echo pending.  Pulmonary Hypertension - Per Dr. Victorino December last office note, substantial pulmonary hypertension persisted even when well diuresed at last cardiac catheterization in 09/2016 which  is consistent with fixed arteriolar disease.  - PASP of 66 mmHg on most recent Echo in 03/2018.   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  - Current Echo pending.  Tricuspid Regurgitation - Severe tricuspid regurgitation noted on Echo in 03/2018. - Felt to be related to pacemaker leads and possible rheumatic tricuspid disease as well. Per Dr. Victorino December last office note, patient is not a good candidate for repeat sternotomy and valve repair.    Atrial Fibrillation s/p PPM - Telemetry shows chronic AF  - Potassium 3.7. Goal >4.0. - Magnesium 2.4. Goal >2.0. - Continue home Lopressor 25mg  twice daily. - Patient also on Cardizem CD 120 mg daily at home but it does not look like this was started on admission. Consider restarting if EF is normal. - Continue chronic anticoagulation with Coumadin. Per Dr. Victorino December last office visit note, patient has had serious bleeding complications in the past (intracranial hemorrhage and intrahepatic bleeding). Therefore, trying to keep INR at 2.0 to 2.5.   CAD with Elevated Troponin - Patient has history of CAD with NSTEMI in 06/2017 s/p DES to RCA. - Patient denies any recent angina. - Initial troponin minimally elevated at 0.05 in the ED which is similar to where it was in 03/2018 when she was admitted for CHF. Suspect demand ischemia due to acute CHF. - Continue medical management.  Hyperlipidemia - Continue home satin.   Signed, Darreld Mclean, PA-C 02/03/2019, 11:45 AM Pager: [phone] For questions or updates, please contact   Please consult [email or web address] for contact info under Cardiology/STEMI.  Patient seen and examined with the above-signed Advanced Practice Provider and/or Housestaff. I personally reviewed laboratory data, imaging studies and relevant notes. I  independently examined the patient and formulated the important aspects of the plan. I have edited the note to reflect any of my changes or salient points. I have personally discussed the plan with the patient and/or family.  78 y/o woman with h/o CAD, MR s/p mechanical MVR, permanent AF who presents with 2 week h/o increasing HF symptoms in setting of dietary noncompliance.   Echo: Echo with normal LV function. Well functioning MVR. RV is dilated with moderate HK and evidence of RV pressure volume overload.Marland Kitchen RVSP estimated at 17mm HG but suspect it is higher.    On exam Mildly SOB HEENT: normal Neck: supple. jvP to ear with prominent v waves. Carotids 2+ bilat; no bruits. No lymphadenopathy or thryomegaly appreciated. Cor: PMI nondisplaced. Irregular/ 2/6 AS 2/6 TR mecahnical s1 Lungs: clear Abdomen: soft, nontender, + distended. No hepatosplenomegaly. No bruits or masses. Good bowel sounds. Extremities: no cyanosis, clubbing, rash, 2+ edema Neuro: alert & orientedx3, cranial nerves grossly intact. moves all 4 extremities w/o difficulty. Affect pleasant  She has evidence of significant volume overload in setting of diastolic dysfunction and RV dysfunction due to longstanding MV disease. Will need aggressive diuresis. Place UNNA boots. Hopefully will not need milrinone support. Tricuspid regurgitation likely due to significant PAH +/- tethering from pacer leads. Consider  RHC at some point to further define. Continue AC for AF and MVR  Glori Bickers, MD  6:14 PM

## 2019-02-03 NOTE — Plan of Care (Signed)
  Problem: Education: Goal: Knowledge of General Education information will improve Description: Including pain rating scale, medication(s)/side effects and non-pharmacologic comfort measures Outcome: Progressing   Problem: Health Behavior/Discharge Planning: Goal: Ability to manage health-related needs will improve Outcome: Progressing   Problem: Clinical Measurements: Goal: Respiratory complications will improve Outcome: Progressing   Problem: Activity: Goal: Risk for activity intolerance will decrease Outcome: Progressing   Problem: Safety: Goal: Ability to remain free from injury will improve Outcome: Progressing   

## 2019-02-03 NOTE — Progress Notes (Addendum)
PROGRESS NOTE  Carrie [name] MCN:[id number] DOB: August 07, 1941 DOA: 02/02/2019 PCP: Lucianne Lei, MD  HPI/Recap of past 24 hours: Patient is a 28-year African-American female with multiple cardiac problems.  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  Patient presents with 2 week history of progressively worsening shortness of breath, leg edema, orthopnea and dyspnea on exertion.  No headache, no neck pain, no chest pain, no fever or chills, no GI symptoms and no urinary symptoms.  On presentation to the hospital, cardiac BNP was greater than 2000 and a chest x-ray revealed severe cardiomegaly with mild pulmonary congestion.  Hospitalist team has been asked to admit patient for further assessment and management.  02/03/19: Patient seen and examined at her bedside.  She reports persistent dyspnea at rest.  She denies any chest pain.  Left scleral erythema noted, patient states it is from shampoo in her left eye. Independently reviewed chest x-ray done in admission which reveals significant cardiomegaly with right pleural effusion.  Last 2D echo was done on 03/31/2018 with preserved LVEF.  Repeated 2D echo on 02/03/2019 results are pending.  Cardiology consulted to further assess.  Assessment/Plan: Active Problems:   CHF (congestive heart failure) (HCC)   SOB (shortness of breath)  Acute on chronic diastolic CHF complicated by right sided heart failure Presented with BNP greater than 2300 from her baseline greater than 1700 Independently reviewed chest x-ray on admission which revealed significant cardiomegaly, increase pulmonary vascularity and right pleural effusion. Last 2D echo done on 03/31/2018 revealed LVEF 60 to 65%  2D echo ordered and pending Cardiology consulted to further assess Strict I's and O's and daily weight Currently on Lasix IV 60 mg twice daily Net I&O -1.9 L since admission  Right pleural effusion likely cardiogenic Continue diuretics O2 saturation okay 95% on room air  Non-exertional dyspnea likely secondary to acute on chronic diastolic CHF combined with severe pulmonary hypertension Management as stated above O2 sats stable 95% on room air  Severe pulmonary hypertension Likely contributing to shortness of breath Cardiology consult  Chronic atrial fibrillation Rate controlled Anticoagulated on Coumadin with therapeutic INR 2.2 Continue Lopressor 25 mg twice daily Continue to closely monitor on telemetry  CKD 3 Creatinine at baseline 1.4 with GFR 39 Avoid nephrotoxic agents Monitor urine output  Sick sinus syndrome status post AICD placement No acute issues  Coronary artery disease Continue Lipitor  Physical debility/ambulatory dysfunction Uses a cane at baseline PT OT to assess when no longer dyspneic  Risks: High risk for decompensation due to acute on chronic diastolic CHF, persistent nonexertional dyspnea, severe pulmonary hypertension, multiple comorbidities and advanced age.  Patient will require at least 2 midnights for further assessment and treatment of present condition.  DVT prophylaxis: Anticoagulation (Coumadin) Code Status: Full Family Communication:  Will call to update. Disposition Plan:  Likely home in 1 to 2 days Consults called: Cardiology consulted   Objective: Vitals:   02/03/19 0434 02/03/19 0734 02/03/19 0755 02/03/19 1154  BP: 106/67 (!) 99/56 106/60 115/65  Pulse: 72 81 72 82  Resp: 20 19  16   Temp: 98.5 F (36.9 C) 98.8 F (37.1 C)  98.5 F (36.9 C)  TempSrc: Oral Oral  Oral  SpO2: 95% 94%  98%  Weight:      Height:  Intake/Output Summary (Last 24 hours) at 02/03/2019 1401 Last data filed at 02/03/2019 1357 Gross per  24 hour  Intake 1020 ml  Output 3650 ml  Net -2630 ml   Filed Weights   02/02/19 2020 02/03/19 0020  Weight: 61.6 kg 61.7 kg    Exam:  . General: 78 y.o. year-old female well developed well nourished in no acute distress.  Alert and oriented x3. . Cardiovascular: Regular rate and rhythm with no rubs or gallops.  No thyromegaly.  Bilateral JVD noted. Marland Kitchen Respiratory: Mild rales at bases with no wheezes noted.  Good inspiratory effort. . Abdomen: Soft nontender nondistended with normal bowel sounds x4 quadrants. . Musculoskeletal: 1+ pitting edema lower extremities bilaterally. 2/4 pulses in all 4 extremities. Marland Kitchen Psychiatry: Mood is appropriate for condition and setting   Data Reviewed: CBC: Recent Labs  Lab 02/02/19 1619 02/03/19 0111  WBC 3.9* 4.1  NEUTROABS 2.3  --   HGB 9.2* 9.0*  HCT 28.4* 26.8*  MCV 88.5 86.7  PLT 153 073*   Basic Metabolic Panel: Recent Labs  Lab 02/02/19 1619 02/03/19 0111  NA 137 137  K 4.5 3.7  CL 105 104  CO2 23 24  GLUCOSE 106* 125*  BUN 39* 37*  CREATININE 1.42* 1.47*  CALCIUM 9.4 9.8  MG  --  2.4  PHOS  --  3.7   GFR: Estimated Creatinine Clearance: 27 mL/min (A) (by C-G formula based on SCr of 1.47 mg/dL (H)). Liver Function Tests: No results for input(s): AST, ALT, ALKPHOS, BILITOT, PROT, ALBUMIN in the last 168 hours. No results for input(s): LIPASE, AMYLASE in the last 168 hours. No results for input(s): AMMONIA in the last 168 hours. Coagulation Profile: Recent Labs  Lab 02/02/19 1628 02/03/19 0111  INR 2.2* 2.2*   Cardiac Enzymes: Recent Labs  Lab 02/02/19 1628  TROPONINI 0.05*   BNP (last 3 results) No results for input(s): PROBNP in the last 8760 hours. HbA1C: No results for input(s): HGBA1C in the last 72 hours. CBG: No results for input(s): GLUCAP in the last 168 hours. Lipid Profile: No results for input(s): CHOL, HDL, LDLCALC, TRIG, CHOLHDL, LDLDIRECT in the last 72 hours. Thyroid Function Tests: No  results for input(s): TSH, T4TOTAL, FREET4, T3FREE, THYROIDAB in the last 72 hours. Anemia Panel: No results for input(s): VITAMINB12, FOLATE, FERRITIN, TIBC, IRON, RETICCTPCT in the last 72 hours. Urine analysis:    Component Value Date/Time   COLORURINE YELLOW 03/18/2016 1332   APPEARANCEUR CLEAR 03/18/2016 1332   LABSPEC 1.013 03/18/2016 1332   PHURINE 7.0 03/18/2016 1332   GLUCOSEU NEGATIVE 03/18/2016 1332   HGBUR MODERATE (A) 03/18/2016 1332   BILIRUBINUR NEGATIVE 03/18/2016 1332   KETONESUR NEGATIVE 03/18/2016 1332   PROTEINUR NEGATIVE 03/18/2016 1332   UROBILINOGEN 0.2 03/15/2013 1323   NITRITE NEGATIVE 03/18/2016 1332   LEUKOCYTESUR NEGATIVE 03/18/2016 1332   Sepsis Labs: @LABRCNTIP (procalcitonin:4,lacticidven:4)  ) Recent Results (from the past 240 hour(s))  SARS Coronavirus 2 (CEPHEID - Performed in [place] hospital lab), Hosp Order     Status: None   Collection Time: 02/02/19  6:17 PM  Result Value Ref Range Status   SARS Coronavirus 2 NEGATIVE NEGATIVE Final    Comment: (NOTE) If result is NEGATIVE SARS-CoV-2 target nucleic acids are NOT DETECTED. The SARS-CoV-2 RNA is generally detectable in upper and lower  respiratory specimens during the acute phase of infection. The lowest  concentration of SARS-CoV-2 viral copies this assay can detect is 250  copies / mL. A negative  result does not preclude SARS-CoV-2 infection  and should not be used as the sole basis for treatment or other  patient management decisions.  A negative result may occur with  improper specimen collection / handling, submission of specimen other  than nasopharyngeal swab, presence of viral mutation(s) within the  areas targeted by this assay, and inadequate number of viral copies  (<250 copies / mL). A negative result must be combined with clinical  observations, patient history, and epidemiological information. If result is POSITIVE SARS-CoV-2 target nucleic acids are DETECTED. The  SARS-CoV-2 RNA is generally detectable in upper and lower  respiratory specimens dur ing the acute phase of infection.  Positive  results are indicative of active infection with SARS-CoV-2.  Clinical  correlation with patient history and other diagnostic information is  necessary to determine patient infection status.  Positive results do  not rule out bacterial infection or co-infection with other viruses. If result is PRESUMPTIVE POSTIVE SARS-CoV-2 nucleic acids MAY BE PRESENT.   A presumptive positive result was obtained on the submitted specimen  and confirmed on repeat testing.  While 2019 novel coronavirus  (SARS-CoV-2) nucleic acids may be present in the submitted sample  additional confirmatory testing may be necessary for epidemiological  and / or clinical management purposes  to differentiate between  SARS-CoV-2 and other Sarbecovirus currently known to infect humans.  If clinically indicated additional testing with an alternate test  methodology [phone]) is advised. The SARS-CoV-2 RNA is generally  detectable in upper and lower respiratory sp ecimens during the acute  phase of infection. The expected result is Negative. Fact Sheet for Patients:  StrictlyIdeas.no Fact Sheet for Healthcare Providers: BankingDealers.co.za This test is not yet approved or cleared by the [place] FDA and has been authorized for detection and/or diagnosis of SARS-CoV-2 by FDA under an Emergency Use Authorization (EUA).  This EUA will remain in effect (meaning this test can be used) for the duration of the COVID-19 declaration under Section 564(b)(1) of the Act, 21 U.S.C. section 360bbb-3(b)(1), unless the authorization is terminated or revoked sooner. Performed at [facility], [place] [address] [place], [place] [address]       Studies: Dg Chest 2 View  Result Date: 02/02/2019 CLINICAL DATA:  Shortness of breath EXAM: CHEST - 2 VIEW  COMPARISON:  03/30/2018 CT chest.  Chest x-ray dated 07/10 2019. FINDINGS: Again identified is massive cardiomegaly. The patient is status post prior median sternotomy. There is a persistent airspace opacity at the right lung base favored to represent a combination of atelectasis and a small right-sided pleural effusion. There is scarring at the right lung base. The left lung field is mostly clear. There is no pneumothorax. There is vascular congestion without overt pulmonary edema. IMPRESSION: 1. Persistent severe cardiomegaly. 2. Opacity at the right lung base favored to represent a combination of a small right-sided pleural effusion and atelectasis. An underlying infiltrate is difficult to exclude but overall this appearance is minimally changed from chest x-ray dated 03/30/2018. 3. No pneumothorax.  Mild vascular congestion. Electronically Signed   By: Constance Holster M.D.   On: 02/02/2019 15:30    Scheduled Meds: . atorvastatin  40 mg Oral q1800  . docusate sodium  200 mg Oral BID  . furosemide  60 mg Intravenous BID  . metoprolol tartrate  25 mg Oral BID  . [START ON 02/09/2019] warfarin  2.5 mg Oral Once per day on Thu  . warfarin  5 mg Oral Once per day  on Sun Tue Wed Fri Sat  . Warfarin - Pharmacist Dosing Inpatient   Does not apply q1800    Continuous Infusions:   LOS: 0 days     Kayleen Memos, MD [facility] Pager [phone]  If 7PM-7AM, please contact night-coverage [email or web address] Password [email or web address] 02/03/2019, 2:01 PM

## 2019-02-03 NOTE — Evaluation (Signed)
Physical Therapy Evaluation Patient Details Name: Carrie [name] MRN: [id number] DOB: 1940/12/30 Today's Date: 02/03/2019   History of Present Illness  Patient is a 78 y/o female presenting to the ED on 02/02/2019 with primary complaints of SOB. 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 Admitted for Acute on chronic diastolic congestive heart failure/right-sided heart failure.    Clinical Impression  Patient admitted with the above listed diagnosis. Patient reports requiring light assist/supervision from husband prior to admission, however patient stating husband recent had surgery and is requiring assist at this time. Patient requires Min A for functional mobility with use of RW. Will currently recommend SNF at discharge due to reduced functional mobility and need for increased assist from PT. PT to continue to follow.     Follow Up Recommendations SNF;Supervision/Assistance - 24 hour    Equipment Recommendations  Rolling walker with 5" wheels    Recommendations for Other Services       Precautions / Restrictions Precautions Precautions: Fall Restrictions Weight Bearing Restrictions: No      Mobility  Bed Mobility Overal bed mobility: Needs Assistance Bed Mobility: Supine to Sit     Supine to sit: Min assist     General bed mobility comments: patient pulling up from PT hand  Transfers Overall transfer level: Needs assistance Equipment used: Rolling walker (2 wheeled) Transfers: Sit to/from [facility] Sit to Stand: Min assist Stand pivot transfers: Min assist       General transfer comment: Min A to power up at bedside; Min A for initial steadying and pivot transfers - states she has had trouble with  turning  Ambulation/Gait Ambulation/Gait assistance: Min assist;Min guard Gait Distance (Feet): 25 Feet Assistive device: Rolling walker (2 wheeled) Gait Pattern/deviations: Step-to pattern;Step-through pattern;Decreased stride length;Shuffle Gait velocity: decreased   General Gait Details: Min A with RW for support for short distance ambulation; most difficulty with turning   Stairs            Wheelchair Mobility    Modified Rankin (Stroke Patients Only)       Balance Overall balance assessment: Needs assistance Sitting-balance support: Single extremity supported;Feet supported Sitting balance-Leahy Scale: Fair     Standing balance support: Bilateral upper extremity supported;During functional activity Standing balance-Leahy Scale: Poor Standing balance comment: reliant on UE support                             Pertinent Vitals/Pain Pain Assessment: No/denies pain    Home Living Family/patient expects to be discharged to:: Private residence Living Arrangements: Spouse/significant other Available Help at Discharge: Family;Available 24 hours/day Type of Home: House Home Access: Stairs to enter Entrance Stairs-Rails: None Entrance Stairs-Number of Steps: 1 Home Layout: Multi-level Home Equipment: Cane - single point;Walker - 4 wheels;Bedside commode;Grab bars - tub/shower      Prior Function Level of Independence: Needs assistance   Gait / Transfers Assistance Needed: use of SPC for mobility  ADL's / Homemaking Assistance Needed: husband would assist with ADLs and mobility        Hand Dominance   Dominant Hand: Right    Extremity/Trunk Assessment   Upper Extremity Assessment Upper Extremity Assessment: Defer to OT evaluation    Lower Extremity Assessment Lower Extremity Assessment: Generalized weakness    Cervical /  Trunk Assessment Cervical / Trunk Assessment: Normal  Communication   Communication: HOH  Cognition  Arousal/Alertness: Awake/alert Behavior During Therapy: WFL for tasks assessed/performed Overall Cognitive Status: Within Functional Limits for tasks assessed                                        General Comments      Exercises     Assessment/Plan    PT Assessment Patient needs continued PT services  PT Problem List Decreased strength;Decreased activity tolerance;Decreased balance;Decreased mobility;Decreased knowledge of use of DME;Decreased safety awareness       PT Treatment Interventions DME instruction;Gait training;Stair training;Functional mobility training;Therapeutic activities;Therapeutic exercise;Balance training;Patient/family education    PT Goals (Current goals can be found in the Care Plan section)  Acute Rehab PT Goals Patient Stated Goal: regain mobility PT Goal Formulation: With patient Time For Goal Achievement: 02/17/19 Potential to Achieve Goals: Good    Frequency Min 3X/week   Barriers to discharge Decreased caregiver support husband had surgery this week - nephew is currently providing care for him    Co-evaluation               AM-PAC PT "6 Clicks" Mobility  Outcome Measure Help needed turning from your back to your side while in a flat bed without using bedrails?: A Little Help needed moving from lying on your back to sitting on the side of a flat bed without using bedrails?: A Little Help needed moving to and from a bed to a chair (including a wheelchair)?: A Little Help needed standing up from a chair using your arms (e.g., wheelchair or bedside chair)?: A Little     6 Click Score: 12    End of Session Equipment Utilized During Treatment: Gait belt Activity Tolerance: Patient tolerated treatment well Patient left: in chair;with call bell/phone within reach;with nursing/sitter in room Nurse Communication: Mobility status PT Visit Diagnosis: Unsteadiness on feet (R26.81);Other abnormalities of gait and mobility  (R26.89);Muscle weakness (generalized) (M62.81)    Time: 1333-1400 PT Time Calculation (min) (ACUTE ONLY): 27 min   Charges:   PT Evaluation $PT Eval Moderate Complexity: 1 Mod PT Treatments $Gait Training: 8-22 mins        Lanney Gins, PT, DPT Supplemental Physical Therapist 02/03/19 3:12 PM Pager: [phone] Office: [phone]

## 2019-02-03 NOTE — Progress Notes (Signed)
Orthopedic Tech Progress Note Patient Details:  Carrie [name] [birth year] [id number]  Ortho Devices Type of Ortho Device: Haematologist Ortho Device/Splint Location: bi-lateral Ortho Device/Splint Interventions: Ordered, Application, Adjustment   Post Interventions Patient Tolerated: Well Instructions Provided: Care of device, Adjustment of device   Karolee Stamps 02/03/2019, 8:18 PM

## 2019-02-03 NOTE — Progress Notes (Signed)
ANTICOAGULATION CONSULT NOTE - Follow Up Consult  Pharmacy Consult for Heparin Indication: Mech MVR and h/o afib  Allergies  Allergen Reactions  . Codeine Nausea And Vomiting    Patient Measurements: Height: 5\' 1"  (154.9 cm) Weight: 136 lb 0.4 oz (61.7 kg) IBW/kg (Calculated) : 47.8  Vital Signs: Temp: 98.8 F (37.1 C) (05/15 0734) Temp Source: Oral (05/15 0734) BP: 106/60 (05/15 0755) Pulse Rate: 72 (05/15 0755)  Labs: Recent Labs    02/02/19 1619 02/02/19 1628 02/03/19 0111  HGB 9.2*  --  9.0*  HCT 28.4*  --  26.8*  PLT 153  --  142*  LABPROT  --  24.5* 24.1*  INR  --  2.2* 2.2*  CREATININE 1.42*  --  1.47*  TROPONINI  --  0.05*  --     Estimated Creatinine Clearance: 27 mL/min (A) (by C-G formula based on SCr of 1.47 mg/dL (H)).   Assessment:  Anticoag: Mechanical MVR and afib on Coumadin PTA (goal 2-2.5). INR 2.2. Hgb 9.  - Coum dose PTA 5mg  daily except 2.5mg  Mon/Thurs. Admit INR 2.2.  Goal of Therapy:  IRN 2-2.5 Monitor platelets by anticoagulation protocol: Yes   Plan:  Continue home Coumadin regimen: 5mg  daily except 2.5mg  Mon/Thurs Daily INR  Virgina Deakins S. Alford Highland, PharmD, BCPS Clinical Staff Pharmacist Eilene Ghazi Stillinger 02/03/2019,10:43 AM

## 2019-02-03 NOTE — Progress Notes (Signed)
2D Echocardiogram has been performed.  Carrie [name] 02/03/2019, 11:03 AM

## 2019-02-04 DIAGNOSIS — I482 Chronic atrial fibrillation, unspecified: Secondary | ICD-10-CM

## 2019-02-04 LAB — BASIC METABOLIC PANEL
Anion gap: 12 (ref 5–15)
BUN: 35 mg/dL — ABNORMAL HIGH (ref 8–23)
CO2: 27 mmol/L (ref 22–32)
Calcium: 8.9 mg/dL (ref 8.9–10.3)
Chloride: 97 mmol/L — ABNORMAL LOW (ref 98–111)
Creatinine, Ser: 0.98 mg/dL (ref 0.44–1.00)
GFR calc Af Amer: >60 mL/min (ref >60)
GFR calc non Af Amer: 56 mL/min — ABNORMAL LOW (ref >60)
Glucose, Bld: 93 mg/dL (ref 70–99)
Potassium: 3.1 mmol/L — ABNORMAL LOW (ref 3.5–5.1)
Sodium: 136 mmol/L (ref 135–145)

## 2019-02-04 LAB — PROTIME-INR
INR: 2.2 — ABNORMAL HIGH (ref 0.8–1.2)
Prothrombin Time: 24.1 seconds — ABNORMAL HIGH (ref 11.4–15.2)

## 2019-02-04 MED ORDER — IPRATROPIUM-ALBUTEROL 0.5-2.5 (3) MG/3ML IN SOLN
3.0000 mL | Freq: Two times a day (BID) | RESPIRATORY_TRACT | Status: DC
  Administered 2019-02-04 – 2019-02-06 (×5): 3 mL via RESPIRATORY_TRACT
  Filled 2019-02-04 (×5): qty 3

## 2019-02-04 MED ORDER — SPIRONOLACTONE 25 MG PO TABS
25.0000 mg | ORAL_TABLET | Freq: Every day | ORAL | Status: DC
  Administered 2019-02-05 – 2019-02-07 (×3): 25 mg via ORAL
  Filled 2019-02-04 (×3): qty 1

## 2019-02-04 MED ORDER — POTASSIUM CHLORIDE CRYS ER 20 MEQ PO TBCR
40.0000 meq | EXTENDED_RELEASE_TABLET | Freq: Three times a day (TID) | ORAL | Status: AC
  Administered 2019-02-04 (×3): 40 meq via ORAL
  Filled 2019-02-04 (×3): qty 2

## 2019-02-04 MED ORDER — MAGNESIUM SULFATE 4 GM/100ML IV SOLN
4.0000 g | Freq: Once | INTRAVENOUS | Status: AC
  Administered 2019-02-04: 4 g via INTRAVENOUS
  Filled 2019-02-04: qty 100

## 2019-02-04 MED ORDER — DIAZEPAM 5 MG/ML IJ SOLN
2.5000 mg | Freq: Once | INTRAMUSCULAR | Status: AC
  Administered 2019-02-04: 2.5 mg via INTRAVENOUS
  Filled 2019-02-04: qty 2

## 2019-02-04 NOTE — Progress Notes (Signed)
ANTICOAGULATION CONSULT NOTE - Follow Up Consult  Pharmacy Consult for Heparin Indication: Mech MVR and h/o afib  Allergies  Allergen Reactions  . Codeine Nausea And Vomiting    Patient Measurements: Height: 5\' 1"  (154.9 cm) Weight: 133 lb 4.8 oz (60.5 kg)(scale b) IBW/kg (Calculated) : 47.8  Vital Signs: Temp: 99.3 F (37.4 C) (05/16 0549) Temp Source: Oral (05/16 0549) BP: 116/59 (05/16 0549) Pulse Rate: 91 (05/16 0549)  Labs: Recent Labs    02/02/19 1619 02/02/19 1628 02/03/19 0111 02/04/19 0506 02/04/19 0700  HGB 9.2*  --  9.0*  --   --   HCT 28.4*  --  26.8*  --   --   PLT 153  --  142*  --   --   LABPROT  --  24.5* 24.1* 24.1*  --   INR  --  2.2* 2.2* 2.2*  --   CREATININE 1.42*  --  1.47*  --  0.98  TROPONINI  --  0.05*  --   --   --     Estimated Creatinine Clearance: 40.1 mL/min (by C-G formula based on SCr of 0.98 mg/dL).   Assessment:  Anticoag: 78 yo female with a PMH including Mechanical MVR and afib on Coumadin PTA (goal 2-2.5). Coumadin dose PTA 5mg  daily except 2.5mg  Mon/Thurs. INR on admission 2.2   INR continues to be 2.2 today. H/H 9/26.8, plts 142. No s/s of bleeding reported.   Goal of Therapy:  IRN 2-2.5 Monitor platelets by anticoagulation protocol: Yes   Plan:  Continue home Coumadin regimen: 5mg  daily except 2.5mg  Mon/Thurs Monitor daily INR, CBCs and s/s of bleeding  Gwenlyn Found, [place] D PGY1 Pharmacy Resident  Phone [phone] 02/04/2019   9:18 AM

## 2019-02-04 NOTE — TOC Initial Note (Signed)
Transition of Care [facility]) - Initial/Assessment Note    Patient Details  Name: Carrie [name] MRN: [id number] Date of Birth: 1941/04/28  Transition of Care [facility]) CM/SW Contact:    Wende Neighbors, LCSW Phone Number: 02/04/2019, 1:22 PM  Clinical Narrative:     CSW meet patient at bedside to discuss discharge plans. Patient stated she lives at home with spouse. Patient stated that spouse is being taken care of by his sister while patient is in the hospital. Patient stated she has been at a SNF before in the past and would not mind returning. Patient stated she would like CSW to talk to spouse before making a decision. CSW attempted to call spouse number and also called patients sister in law and left voicemail. CSW will continue to follow patient for support and discharge needs            Expected Discharge Plan: Skilled Nursing Facility Barriers to Discharge: Ship broker, Continued Medical Work up   Patient Goals and CMS Choice Patient states their goals for this hospitalization and ongoing recovery are:: to bak home to husband  CMS Medicare.gov Compare Post Acute Care list provided to:: Patient Choice offered to / list presented to : Patient  Expected Discharge Plan and Services Expected Discharge Plan: [facility] In-house Referral: Clinical Social Work     Living arrangements for the past 2 months: [place]                                      Prior Living Arrangements/Services Living arrangements for the past 2 months: Single Family Home Lives with:: Self Patient language and need for interpreter reviewed:: No Do you feel safe going back to the place where you live?: Yes      Need for Family Participation in Patient Care: Yes (Comment) Care giver support system in place?: Yes (comment)   Criminal Activity/Legal Involvement Pertinent to Current Situation/Hospitalization: Yes - Comment as needed  Activities of Daily Living Home  Assistive Devices/Equipment: Eyeglasses, Cane (specify quad or straight) ADL Screening (condition at time of admission) Patient's cognitive ability adequate to safely complete daily activities?: Yes Is the patient deaf or have difficulty hearing?: No Does the patient have difficulty seeing, even when wearing glasses/contacts?: No Does the patient have difficulty concentrating, remembering, or making decisions?: No Patient able to express need for assistance with ADLs?: Yes Does the patient have difficulty dressing or bathing?: No Independently performs ADLs?: Yes (appropriate for developmental age) Does the patient have difficulty walking or climbing stairs?: Yes Weakness of Legs: Both Weakness of Arms/Hands: Both  Permission Sought/Granted Permission sought to share information with : Family Supports Permission granted to share information with : Yes, Verbal Permission Granted  Share Information with NAME: Carrie [name]     Permission granted to share info w Relationship: spouse  Permission granted to share info w Contact Information: [phone]  Emotional Assessment Appearance:: Appears stated age Attitude/Demeanor/Rapport: Self-Confident, Engaged Affect (typically observed): Accepting Orientation: : Oriented to Self, Oriented to Place, Oriented to  Time, Oriented to Situation Alcohol / Substance Use: Not Applicable Psych Involvement: No (comment)  Admission diagnosis:  Shortness of breath [R06.02] Acute on chronic combined systolic and diastolic congestive heart failure ([place]) [I50.43] Patient Active Problem List   Diagnosis Date Noted  . CHF exacerbation ([place]) 02/03/2019  . CHF (congestive heart failure) ([place]) 02/02/2019  . SOB (shortness of breath)  02/02/2019  . Diarrhea 03/31/2018  . Abnormal LFTs 03/31/2018  . Hemoptysis 03/30/2018  . Elevated troponin 03/30/2018  . CKD (chronic kidney disease), stage III ([place]) 03/30/2018  . HLD (hyperlipidemia) 03/30/2018  . Blurry  vision 03/30/2018  . Status post coronary artery stent placement   . Chronic diastolic heart failure ([place])   . History of mitral valve replacement with mechanical valve   . Hypertensive heart disease with heart failure ([place])   . Pure hypercholesterolemia   . Coronary artery disease involving native coronary artery of native heart without angina pectoris 08/11/2016  . PAH (pulmonary artery hypertension) ([place]) 05/05/2016  . Anemia due to other cause   . AKI (acute kidney injury) ([place])   . Hyponatremia 03/12/2016  . Chest pain 03/11/2016  . NSTEMI (non-ST elevated myocardial infarction) ([place]) 03/11/2016  . 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 Thrombocytopenia ([place]) 12/03/2014  . SSS (sick sinus syndrome) ([place]) 07/24/2014  . Longstanding persistent atrial fibrillation 07/24/2014  . Pacemaker 07/24/2014  . Tachycardia, a fib with RVR  04/03/2013  . Essential hypertension 04/03/2013  . SAH (subarachnoid hemorrhage), December 2012 03/21/2012  . Hemorrhage, hepatic, Rx'd with Coumadin reversal and embolisation 03/17/12 03/17/2012  . 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 Chronic anticoagulation, ( INR goal 2.0-2.5 due to history of subdural hematoma and liver hemorrhage) 09/09/2011  . Muscle weakness of lower extremity 09/08/2011  . Pacemaker - single chamber Medtronic Adapta, 2008 09/08/2011  . S/P mitral valve replacement, St Jude 09/06/2011  . CONSTIPATION 02/12/2009   PCP:  Lucianne Lei, MD Pharmacy:   [facility] [address] [place] [address] Phone: [phone] Fax: [phone]     Social Determinants of Health (SDOH) Interventions    Readmission Risk Interventions No flowsheet data found.

## 2019-02-04 NOTE — NC FL2 (Signed)
[place] MEDICAID FL2 LEVEL OF CARE SCREENING TOOL     IDENTIFICATION  Patient Name: Carrie [name] Birthdate: 04-08-1941 Sex: female Admission Date (Current Location): 02/02/2019  [facility] and [place] Number:  Herbalist and Address:  [facility], [place] [address] [place], [place] [address]      Provider Number: [id number]  Attending Physician Name and Address:  Kayleen Memos, DO  Relative Name and Phone Number:  Lycia Sachdeva, [phone]    Current Level of Care: Hospital Recommended Level of Care: [place] Prior Approval Number:    Date Approved/Denied:   PASRR Number: [id number]  Discharge Plan: SNF    Current Diagnoses: Patient Active Problem List   Diagnosis Date Noted  . CHF exacerbation ([place]) 02/03/2019  . CHF (congestive heart failure) ([place]) 02/02/2019  . SOB (shortness of breath) 02/02/2019  . Diarrhea 03/31/2018  . Abnormal LFTs 03/31/2018  . Hemoptysis 03/30/2018  . Elevated troponin 03/30/2018  . CKD (chronic kidney disease), stage III ([place]) 03/30/2018  . HLD (hyperlipidemia) 03/30/2018  . Blurry vision 03/30/2018  . Status post coronary artery stent placement   . Chronic diastolic heart failure ([place])   . History of mitral valve replacement with mechanical valve   . Hypertensive heart disease with heart failure ([place])   . Pure hypercholesterolemia   . Coronary artery disease involving native coronary artery of native heart without angina pectoris 08/11/2016  . PAH (pulmonary artery hypertension) ([place]) 05/05/2016  . Anemia due to other cause   . AKI (acute kidney injury) ([place])   . Hyponatremia 03/12/2016  . Chest pain 03/11/2016  . NSTEMI (non-ST elevated myocardial infarction) ([place]) 03/11/2016  . 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 Thrombocytopenia ([place]) 12/03/2014  . SSS (sick sinus syndrome) ([place]) 07/24/2014  . Longstanding persistent atrial fibrillation 07/24/2014  .  Pacemaker 07/24/2014  . Tachycardia, a fib with RVR  04/03/2013  . Essential hypertension 04/03/2013  . SAH (subarachnoid hemorrhage), December 2012 03/21/2012  . Hemorrhage, hepatic, Rx'd with Coumadin reversal and embolisation 03/17/12 03/17/2012  . 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 Chronic anticoagulation, ( INR goal 2.0-2.5 due to history of subdural hematoma and liver hemorrhage) 09/09/2011  . Muscle weakness of lower extremity 09/08/2011  . Pacemaker - single chamber Medtronic Adapta, 2008 09/08/2011  . S/P mitral valve replacement, St Jude 09/06/2011  . CONSTIPATION 02/12/2009    Orientation RESPIRATION BLADDER Height & Weight     Self, Time, Situation, Place(patient goes in and out)  O2(2.5 L) Continent, External catheter Weight: 133 lb 4.8 oz (60.5 kg)(scale b) Height:  5\' 1"  (154.9 cm)  BEHAVIORAL SYMPTOMS/MOOD NEUROLOGICAL BOWEL NUTRITION STATUS      Continent Diet(heart healthy)  AMBULATORY STATUS COMMUNICATION OF NEEDS Skin   Limited Assist Verbally Normal                       Personal Care Assistance Level of Assistance  Bathing, Feeding, Dressing Bathing Assistance: Limited assistance Feeding assistance: Independent Dressing Assistance: Limited assistance     Functional Limitations Info  Sight, Hearing, Speech Sight Info: Impaired(left eye redness) Hearing Info: Adequate Speech Info: Adequate    SPECIAL CARE FACTORS FREQUENCY  PT (By licensed PT), OT (By licensed OT)     PT Frequency: 5x wk OT Frequency: 5x wk            Contractures Contractures Info: Not present    Additional Factors Info  Allergies, Code Status Code Status Info: Full Code Allergies Info: CODEINE           Current Medications (02/04/2019):  This is the current hospital active medication list Current Facility-Administered Medications  Medication Dose Route Frequency Provider Last Rate Last Dose  . atorvastatin (LIPITOR) tablet 40 mg  40 mg  Oral q1800 Dana Allan I, MD   40 mg at 02/03/19 1700  . docusate sodium (COLACE) capsule 200 mg  200 mg Oral BID Dana Allan I, MD   200 mg at 02/04/19 0959  . furosemide (LASIX) injection 80 mg  80 mg Intravenous BID Bensimhon, Shaune Pascal, MD   80 mg at 02/04/19 1000  . ipratropium-albuterol (DUONEB) 0.5-2.5 (3) MG/3ML nebulizer solution 3 mL  3 mL Nebulization BID Irene Pap N, DO   3 mL at 02/04/19 1259  . metoprolol tartrate (LOPRESSOR) tablet 25 mg  25 mg Oral BID Dana Allan I, MD   25 mg at 02/04/19 0959  . polyethylene glycol (MIRALAX / GLYCOLAX) packet 17 g  17 g Oral PRN Dana Allan I, MD      . polyvinyl alcohol (LIQUIFILM TEARS) 1.4 % ophthalmic solution 1 drop  1 drop Left Eye PRN Irene Pap N, DO   1 drop at 02/03/19 1659  . potassium chloride SA (K-DUR) CR tablet 40 mEq  40 mEq Oral TID Irene Pap N, DO   40 mEq at 02/04/19 0959  . [START ON 02/05/2019] spironolactone (ALDACTONE) tablet 25 mg  25 mg Oral Daily Bensimhon, Shaune Pascal, MD      . Derrill Memo ON 02/09/2019] warfarin (COUMADIN) tablet 2.5 mg  2.5 mg Oral Once per day on Thu Karren Cobble, RPH      . warfarin (COUMADIN) tablet 5 mg  5 mg Oral Once per day on Sun Tue Wed Fri Sat Karren Cobble, RPH   5 mg at 02/03/19 1700  . Warfarin - Pharmacist Dosing Inpatient   Does not apply q1800 Karren Cobble [facility]         Discharge Medications: Please see discharge summary for a list of discharge medications.  Relevant Imaging Results:  Relevant Lab Results:   Additional Information SS# [id number]  Wende Neighbors, LCSW

## 2019-02-04 NOTE — Progress Notes (Signed)
PROGRESS NOTE  Carrie [name] GYJ:[id number] DOB: October 21, 1940 DOA: 02/02/2019 PCP: Lucianne Lei, MD  HPI/Recap of past 24 hours: Patient is a 78-year African-American female with multiple cardiac problems.  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  Patient presents with 2 week history of progressively worsening shortness of breath, leg edema, orthopnea and dyspnea on exertion.  No headache, no neck pain, no chest pain, no fever or chills, no GI symptoms and no urinary symptoms.  On presentation to the hospital, cardiac BNP was greater than 2000 and a chest x-ray revealed severe cardiomegaly with mild pulmonary congestion.  Hospitalist team has been asked to admit patient for further assessment and management.  02/04/19: Patient seen and examined at her bedside.  No acute events overnight.  Reports dyspnea is improving but still present.  She denies chest pain or palpitations.  She is diuresing well -3.5 L since admission.  Unna boots in place per heart failure team.   Assessment/Plan: Active Problems:   CHF (congestive heart failure) (HCC)   SOB (shortness of breath)   CHF exacerbation (HCC)  Acute on chronic diastolic CHF complicated by right sided heart failure Presented with BNP greater than 2300 from her baseline greater than 1700 Independently reviewed chest x-ray on admission which revealed significant cardiomegaly, increase pulmonary vascularity and right pleural effusion. Last 2D echo done on 03/31/2018 revealed LVEF 60 to 65% 2D echo done on 02/03/2019 showed normal LV function, well-functioning MVR with evidence of RV pressure volume overload.   Heart failure team following Diuresing well net I&O -3.5 L  since admission Continue strict I's and O's and daily weight Continue IV Lasix 80 mg twice daily Management per heart failure team Unna boots in place with improvement of her bilateral lower extremity edema.  Hypokalemia Repleted with oral KCl 40 mEq x 3 Daily BMP  Right pleural effusion likely cardiogenic Continue diuretics O2 saturation okay 95% on room air  Non-exertional dyspnea likely secondary to acute on chronic diastolic CHF combined with severe pulmonary hypertension Management as stated above O2 sats stable 95% on room air  Severe pulmonary hypertension Likely contributing to shortness of breath Cardiology consult  Chronic atrial fibrillation Rate controlled Anticoagulated on Coumadin with therapeutic INR 2.2 Continue Lopressor 25 mg twice daily Continue to closely monitor on telemetry  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  Resolved AKI Baseline creatinine appears to be 0.9 with GFR greater than 60 Presented with creatinine 1.47 with GFR of 39 Creatinine back to baseline at 0.9 with GFR greater than 60 Continue to avoid nephrotoxic agents Continue to monitor urine output Daily BMP   Sick sinus syndrome status post AICD placement No acute issues  Coronary artery disease Continue Lipitor  Physical debility/ambulatory dysfunction Uses a cane at baseline PT OT recommended SNF Family preference is for home with home health PT    DVT prophylaxis: Anticoagulation (Coumadin) Code Status: Full Family Communication:  Will call to update. Disposition Plan:  Likely home in 1 to 2 days or when cardiology signs off. Consults called: Cardiology consulted   Objective: Vitals:   02/04/19 0549 02/04/19 1003 02/04/19 1146 02/04/19 1300  BP: (!) 116/59 Marland Kitchen)  110/53 117/78   Pulse: 91 70 67   Resp:  20 20   Temp: 99.3 F  (37.4 C) 98.9 F (37.2 C) 98.4 F (36.9 C)   TempSrc: Oral Oral Oral   SpO2: 90% 94% 96% 97%  Weight: 60.5 kg     Height:        Intake/Output Summary (Last 24 hours) at 02/04/2019 1454 Last data filed at 02/04/2019 1350 Gross per 24 hour  Intake 640 ml  Output 2650 ml  Net -2010 ml   Filed Weights   02/02/19 2020 02/03/19 0020 02/04/19 0549  Weight: 61.6 kg 61.7 kg 60.5 kg    Exam:  . General: 78 y.o. year-old female well-developed well-nourished in no acute distress.  Alert and oriented x3.   . Cardiovascular: Irregular rate and rhythm with no rubs or gallops.  No JVD or thyromegaly noted.   Marland Kitchen Respiratory: Mild rales at bases with no wheezes noted.  Good inspiratory effort. . Abdomen: Soft nontender nondistended with normal bowel sounds x4 quadrants.   . Musculoskeletal: Unna boot in place. Marland Kitchen Psychiatry: Mood is appropriate for condition and setting.   Data Reviewed: CBC: Recent Labs  Lab 02/02/19 1619 02/03/19 0111  WBC 3.9* 4.1  NEUTROABS 2.3  --   HGB 9.2* 9.0*  HCT 28.4* 26.8*  MCV 88.5 86.7  PLT 153 401*   Basic Metabolic Panel: Recent Labs  Lab 02/02/19 1619 02/03/19 0111 02/04/19 0700  NA 137 137 136  K 4.5 3.7 3.1*  CL 105 104 97*  CO2 23 24 27   GLUCOSE 106* 125* 93  BUN 39* 37* 35*  CREATININE 1.42* 1.47* 0.98  CALCIUM 9.4 9.8 8.9  MG  --  2.4  --   PHOS  --  3.7  --    GFR: Estimated Creatinine Clearance: 40.1 mL/min (by C-G formula based on SCr of 0.98 mg/dL). Liver Function Tests: No results for input(s): AST, ALT, ALKPHOS, BILITOT, PROT, ALBUMIN in the last 168 hours. No results for input(s): LIPASE, AMYLASE in the last 168 hours. No results for input(s): AMMONIA in the last 168 hours. Coagulation Profile: Recent Labs  Lab 02/02/19 1628 02/03/19 0111 02/04/19 0506  INR 2.2* 2.2* 2.2*   Cardiac Enzymes: Recent Labs  Lab 02/02/19 1628  TROPONINI 0.05*   BNP (last 3 results) No results for input(s): PROBNP in the last 8760  hours. HbA1C: No results for input(s): HGBA1C in the last 72 hours. CBG: No results for input(s): GLUCAP in the last 168 hours. Lipid Profile: No results for input(s): CHOL, HDL, LDLCALC, TRIG, CHOLHDL, LDLDIRECT in the last 72 hours. Thyroid Function Tests: No results for input(s): TSH, T4TOTAL, FREET4, T3FREE, THYROIDAB in the last 72 hours. Anemia Panel: No results for input(s): VITAMINB12, FOLATE, FERRITIN, TIBC, IRON, RETICCTPCT in the last 72 hours. Urine analysis:    Component Value Date/Time   COLORURINE YELLOW 03/18/2016 1332   APPEARANCEUR CLEAR 03/18/2016 1332   LABSPEC 1.013 03/18/2016 1332   PHURINE 7.0 03/18/2016 1332   GLUCOSEU NEGATIVE 03/18/2016 1332   HGBUR MODERATE (A) 03/18/2016 1332   BILIRUBINUR NEGATIVE 03/18/2016 1332   KETONESUR NEGATIVE 03/18/2016 1332   PROTEINUR NEGATIVE 03/18/2016 1332   UROBILINOGEN 0.2 03/15/2013 1323   NITRITE NEGATIVE 03/18/2016 1332   LEUKOCYTESUR NEGATIVE 03/18/2016 1332   Sepsis Labs: @LABRCNTIP (procalcitonin:4,lacticidven:4)  ) Recent Results (from the past 240 hour(s))  SARS Coronavirus 2 (CEPHEID - Performed in [place] hospital lab), Hosp Order     Status: None   Collection  Time: 02/02/19  6:17 PM  Result Value Ref Range Status   SARS Coronavirus 2 NEGATIVE NEGATIVE Final    Comment: (NOTE) If result is NEGATIVE SARS-CoV-2 target nucleic acids are NOT DETECTED. The SARS-CoV-2 RNA is generally detectable in upper and lower  respiratory specimens during the acute phase of infection. The lowest  concentration of SARS-CoV-2 viral copies this assay can detect is 250  copies / mL. A negative result does not preclude SARS-CoV-2 infection  and should not be used as the sole basis for treatment or other  patient management decisions.  A negative result may occur with  improper specimen collection / handling, submission of specimen other  than nasopharyngeal swab, presence of viral mutation(s) within the  areas targeted  by this assay, and inadequate number of viral copies  (<250 copies / mL). A negative result must be combined with clinical  observations, patient history, and epidemiological information. If result is POSITIVE SARS-CoV-2 target nucleic acids are DETECTED. The SARS-CoV-2 RNA is generally detectable in upper and lower  respiratory specimens dur ing the acute phase of infection.  Positive  results are indicative of active infection with SARS-CoV-2.  Clinical  correlation with patient history and other diagnostic information is  necessary to determine patient infection status.  Positive results do  not rule out bacterial infection or co-infection with other viruses. If result is PRESUMPTIVE POSTIVE SARS-CoV-2 nucleic acids MAY BE PRESENT.   A presumptive positive result was obtained on the submitted specimen  and confirmed on repeat testing.  While 2019 novel coronavirus  (SARS-CoV-2) nucleic acids may be present in the submitted sample  additional confirmatory testing may be necessary for epidemiological  and / or clinical management purposes  to differentiate between  SARS-CoV-2 and other Sarbecovirus currently known to infect humans.  If clinically indicated additional testing with an alternate test  methodology [phone]) is advised. The SARS-CoV-2 RNA is generally  detectable in upper and lower respiratory sp ecimens during the acute  phase of infection. The expected result is Negative. Fact Sheet for Patients:  StrictlyIdeas.no Fact Sheet for Healthcare Providers: BankingDealers.co.za This test is not yet approved or cleared by the [place] FDA and has been authorized for detection and/or diagnosis of SARS-CoV-2 by FDA under an Emergency Use Authorization (EUA).  This EUA will remain in effect (meaning this test can be used) for the duration of the COVID-19 declaration under Section 564(b)(1) of the Act, 21 U.S.C. section  360bbb-3(b)(1), unless the authorization is terminated or revoked sooner. Performed at [facility], [place] [address] [place], [place] [address]       Studies: No results found.  Scheduled Meds: . atorvastatin  40 mg Oral q1800  . docusate sodium  200 mg Oral BID  . furosemide  80 mg Intravenous BID  . ipratropium-albuterol  3 mL Nebulization BID  . metoprolol tartrate  25 mg Oral BID  . potassium chloride  40 mEq Oral TID  . [START ON 02/05/2019] spironolactone  25 mg Oral Daily  . [START ON 02/09/2019] warfarin  2.5 mg Oral Once per day on Thu  . warfarin  5 mg Oral Once per day on Sun Tue Wed Fri Sat  . Warfarin - Pharmacist Dosing Inpatient   Does not apply q1800    Continuous Infusions:   LOS: 1 day     Kayleen Memos, MD [facility] Pager [phone]  If 7PM-7AM, please contact night-coverage [email or web address] Password [email or web address] 02/04/2019, 2:54 PM

## 2019-02-04 NOTE — Progress Notes (Addendum)
Advanced Heart Failure Rounding Note   Subjective:    Diuresing well on IV lasix. Weight down 4 pounds. Breathing better. Denies SOB, orthopnea or PND. No CP. Renal function stable.    Objective:   Weight Range:  Vital Signs:   Temp:  [97 F (36.1 C)-99.3 F (37.4 C)] 98.9 F (37.2 C) (05/16 1003) Pulse Rate:  [57-91] 70 (05/16 1003) Resp:  [16-20] 20 (05/16 1003) BP: (101-135)/(53-71) 110/53 (05/16 1003) SpO2:  [90 %-99 %] 94 % (05/16 1003) Weight:  [60.5 kg] 60.5 kg (05/16 0549) Last BM Date: 02/03/19  Weight change: Filed Weights   02/02/19 2020 02/03/19 0020 02/04/19 0549  Weight: 61.6 kg 61.7 kg 60.5 kg    Intake/Output:   Intake/Output Summary (Last 24 hours) at 02/04/2019 1033 Last data filed at 02/04/2019 0900 Gross per 24 hour  Intake 700 ml  Output 2350 ml  Net -1650 ml     Physical Exam: General:  Sitting up in bed Well appearing. No resp difficulty HEENT: normal Neck: supple. JVP 9-10 prominent v-waves . Carotids 2+ bilat; no bruits. No lymphadenopathy or thryomegaly appreciated. Cor: PMI nondisplaced. Irregular rate & rhythm. 2/6 AS/MR/TR. Mechanical s1 Lungs: clear Abdomen: soft, nontender, nondistended. No hepatosplenomegaly. No bruits or masses. Good bowel sounds. Extremities: no cyanosis, clubbing, rash, 1-2+ edema + UNNA boots  Neuro: alert & orientedx3, cranial nerves grossly intact. moves all 4 extremities w/o difficulty. Affect pleasant  Telemetry: AF 70-80s Personally reviewed   Labs: Basic Metabolic Panel: Recent Labs  Lab 02/02/19 1619 02/03/19 0111 02/04/19 0700  NA 137 137 136  K 4.5 3.7 3.1*  CL 105 104 97*  CO2 23 24 27   GLUCOSE 106* 125* 93  BUN 39* 37* 35*  CREATININE 1.42* 1.47* 0.98  CALCIUM 9.4 9.8 8.9  MG  --  2.4  --   PHOS  --  3.7  --     Liver Function Tests: No results for input(s): AST, ALT, ALKPHOS, BILITOT, PROT, ALBUMIN in the last 168 hours. No results for input(s): LIPASE, AMYLASE in the last 168  hours. No results for input(s): AMMONIA in the last 168 hours.  CBC: Recent Labs  Lab 02/02/19 1619 02/03/19 0111  WBC 3.9* 4.1  NEUTROABS 2.3  --   HGB 9.2* 9.0*  HCT 28.4* 26.8*  MCV 88.5 86.7  PLT 153 142*    Cardiac Enzymes: Recent Labs  Lab 02/02/19 1628  TROPONINI 0.05*    BNP: BNP (last 3 results) Recent Labs    03/30/18 2051 02/02/19 1619  BNP 1,780.8* 2,329.2*    ProBNP (last 3 results) No results for input(s): PROBNP in the last 8760 hours.    Other results:  Imaging: Dg Chest 2 View  Result Date: 02/02/2019 CLINICAL DATA:  Shortness of breath EXAM: CHEST - 2 VIEW COMPARISON:  03/30/2018 CT chest.  Chest x-ray dated 07/10 2019. FINDINGS: Again identified is massive cardiomegaly. The patient is status post prior median sternotomy. There is a persistent airspace opacity at the right lung base favored to represent a combination of atelectasis and a small right-sided pleural effusion. There is scarring at the right lung base. The left lung field is mostly clear. There is no pneumothorax. There is vascular congestion without overt pulmonary edema. IMPRESSION: 1. Persistent severe cardiomegaly. 2. Opacity at the right lung base favored to represent a combination of a small right-sided pleural effusion and atelectasis. An underlying infiltrate is difficult to exclude but overall this appearance is minimally changed from  chest x-ray dated 03/30/2018. 3. No pneumothorax.  Mild vascular congestion. Electronically Signed   By: Constance Holster M.D.   On: 02/02/2019 15:30      Medications:     Scheduled Medications: . atorvastatin  40 mg Oral q1800  . docusate sodium  200 mg Oral BID  . furosemide  80 mg Intravenous BID  . metoprolol tartrate  25 mg Oral BID  . potassium chloride  40 mEq Oral TID  . spironolactone  12.5 mg Oral Daily  . [START ON 02/09/2019] warfarin  2.5 mg Oral Once per day on Thu  . warfarin  5 mg Oral Once per day on Sun Tue Wed Fri Sat   . Warfarin - Pharmacist Dosing Inpatient   Does not apply q1800     Infusions: . magnesium sulfate bolus IVPB 4 g (02/04/19 1020)     PRN Medications:  polyethylene glycol, polyvinyl alcohol   Assessment/Plan:   1. 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 Well functioning MVR. RV is dilated with moderate HK and evidence of RV pressure volume overload.Marland Kitchen RVSP estimated at 64mm HG but suspect it is higher.  - BNP elevated > 2,000. - Continues to improve with IV lasix. Renal function stable.  - Continue diuresis - Continue UNNA boots.  - Will defer any changes to diuresis to MD who will assess volume status.  - Continue home beta-blocker. - Continue to monitor daily weight, strict I/O's, and renal function.  2. 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 Pulmonary Hypertension - Per Dr. Victorino December last office note, substantial pulmonary hypertension persisted even when well diuresed at last cardiac catheterization in 09/2016 which is consistent with fixed arteriolar disease.   4. 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  - MVR stable on echo  5. Tricuspid Regurgitation - Severe tricuspid regurgitation on echo  - Felt to be related to [facility] and possible tethering of TV due to pacemaker leads. Per Dr. Victorino December last office note, patient is not a good candidate for repeat sternotomy and valve repair.    6. Permanent Atrial Fibrillation s/p PPM - Rate controlled. INR 2.2  - Continue chronic anticoagulation with Coumadin. Per Dr. Victorino December last office visit note, patient has had  serious bleeding complications in the past (intracranial hemorrhage and intrahepatic bleeding). Therefore, trying to keep INR at 2.0 to 2.5. Discussed dosing with PharmD personally.   9. CAD with Elevated Troponin - Patient has history of CAD with NSTEMI in 06/2017 s/p DES to RCA. - Patient denies any recent angina. - Initial troponin minimally elevated at 0.05 in the ED which is similar to where it was in 03/2018 when she was admitted for CHF. Suspect demand ischemia due to acute CHF. - Continue medical management.  10. Hypokalemia/hypomagnesemia - supp - spiro added  Length of Stay: 1   Glori Bickers MD 02/04/2019, 10:33 AM  Advanced Heart Failure Team Pager [phone] (M-F; 7a - 4p)  Please contact [facility] for night-coverage after hours (4p -7a ) and weekends on [email or web address]

## 2019-02-04 NOTE — Progress Notes (Signed)
Valium 2.5mg  iv given & wasted 7.5mg  IN STERICYCLE with [employer], [employer]

## 2019-02-04 NOTE — Plan of Care (Signed)

## 2019-02-05 LAB — CBC
HCT: 26.4 % — ABNORMAL LOW (ref 36.0–46.0)
Hemoglobin: 8.9 g/dL — ABNORMAL LOW (ref 12.0–15.0)
MCH: 28.7 pg (ref 26.0–34.0)
MCHC: 33.7 g/dL (ref 30.0–36.0)
MCV: 85.2 fL (ref 80.0–100.0)
Platelets: 145 10*3/uL — ABNORMAL LOW (ref 150–400)
RBC: 3.1 MIL/uL — ABNORMAL LOW (ref 3.87–5.11)
RDW: 25 % — ABNORMAL HIGH (ref 11.5–15.5)
WBC: 5.7 10*3/uL (ref 4.0–10.5)
nRBC: 0 % (ref 0.0–0.2)

## 2019-02-05 LAB — BASIC METABOLIC PANEL
Anion gap: 11 (ref 5–15)
BUN: 38 mg/dL — ABNORMAL HIGH (ref 8–23)
CO2: 28 mmol/L (ref 22–32)
Calcium: 9.1 mg/dL (ref 8.9–10.3)
Chloride: 98 mmol/L (ref 98–111)
Creatinine, Ser: 1.32 mg/dL — ABNORMAL HIGH (ref 0.44–1.00)
GFR calc Af Amer: 45 mL/min — ABNORMAL LOW (ref >60)
GFR calc non Af Amer: 39 mL/min — ABNORMAL LOW (ref >60)
Glucose, Bld: 96 mg/dL (ref 70–99)
Potassium: 4.4 mmol/L (ref 3.5–5.1)
Sodium: 137 mmol/L (ref 135–145)

## 2019-02-05 LAB — PROTIME-INR
INR: 2.1 — ABNORMAL HIGH (ref 0.8–1.2)
Prothrombin Time: 23.6 seconds — ABNORMAL HIGH (ref 11.4–15.2)

## 2019-02-05 MED ORDER — COLCHICINE 0.6 MG PO TABS
0.6000 mg | ORAL_TABLET | Freq: Every day | ORAL | Status: AC
  Administered 2019-02-05 – 2019-02-07 (×3): 0.6 mg via ORAL
  Filled 2019-02-05 (×3): qty 1

## 2019-02-05 MED ORDER — ASPIRIN 81 MG PO CHEW
81.0000 mg | CHEWABLE_TABLET | ORAL | Status: AC
  Administered 2019-02-06: 81 mg via ORAL
  Filled 2019-02-05: qty 1

## 2019-02-05 NOTE — Plan of Care (Signed)

## 2019-02-05 NOTE — Progress Notes (Signed)
ANTICOAGULATION CONSULT NOTE - Follow Up Consult  Pharmacy Consult for Heparin Indication: Mech MVR and h/o afib  Allergies  Allergen Reactions  . Codeine Nausea And Vomiting    Patient Measurements: Height: 5\' 1"  (154.9 cm) Weight: 136 lb 14.5 oz (62.1 kg) IBW/kg (Calculated) : 47.8  Vital Signs: Temp: 98.8 F (37.1 C) (05/17 0418) Temp Source: Oral (05/17 0418) BP: 101/56 (05/17 0418) Pulse Rate: 80 (05/17 0418)  Labs: Recent Labs    02/02/19 1619  02/02/19 1628 02/03/19 0111 02/04/19 0506 02/04/19 0700 02/05/19 0610  HGB 9.2*  --   --  9.0*  --   --  8.9*  HCT 28.4*  --   --  26.8*  --   --  26.4*  PLT 153  --   --  142*  --   --  145*  LABPROT  --    < > 24.5* 24.1* 24.1*  --  23.6*  INR  --    < > 2.2* 2.2* 2.2*  --  2.1*  CREATININE 1.42*  --   --  1.47*  --  0.98 1.32*  TROPONINI  --   --  0.05*  --   --   --   --    < > = values in this interval not displayed.    Estimated Creatinine Clearance: 30.1 mL/min (A) (by C-G formula based on SCr of 1.32 mg/dL (H)).   Assessment:  Anticoag: 78 yo female with a PMH including Mechanical MVR and afib on Coumadin PTA (goal 2-2.5). Coumadin dose PTA 5mg  daily except 2.5mg  Mon/Thurs. INR on admission 2.2   INR continues to be 2.1 today. H/H 8.9/26.4, plts 145. No s/s of bleeding reported.   Goal of Therapy:  IRN 2-2.5 Monitor platelets by anticoagulation protocol: Yes   Plan:  Continue home Coumadin regimen: 5mg  daily except 2.5mg  Mon/Thurs Monitor daily INR, CBCs and s/s of bleeding  Gwenlyn Found, [place] D PGY1 Pharmacy Resident  Phone [phone] 02/05/2019   9:16 AM

## 2019-02-05 NOTE — Progress Notes (Signed)
Advanced Heart Failure Rounding Note   Subjective:    Diuresing well on IV lasix. I/Os -1.5L overnight but weight recorded as up. (likely inaccurate)  Denies SOB, orthopnea or PND. LE edema improving. Creatinine 1.4 -> 0.98 -> 1.3.  INR 2.1  No bleeding   Objective:   Weight Range:  Vital Signs:   Temp:  [98 F (36.7 C)-98.9 F (37.2 C)] 98 F (36.7 C) (05/17 1020) Pulse Rate:  [62-93] 93 (05/17 1020) Resp:  [18-28] 20 (05/17 1020) BP: (101-126)/(56-78) 102/56 (05/17 1020) SpO2:  [94 %-100 %] 96 % (05/17 1020) Weight:  [62.1 kg] 62.1 kg (05/17 0303) Last BM Date: 02/03/19  Weight change: Filed Weights   02/03/19 0020 02/04/19 0549 02/05/19 0303  Weight: 61.7 kg 60.5 kg 62.1 kg    Intake/Output:   Intake/Output Summary (Last 24 hours) at 02/05/2019 1134 Last data filed at 02/05/2019 0900 Gross per 24 hour  Intake 990 ml  Output 3000 ml  Net -2010 ml     Physical Exam: General:  Sitting up in bed No resp difficulty HEENT: normal Neck: supple. JVP 7-8 Carotids 2+ bilat; no bruits. No lymphadenopathy or thryomegaly appreciated. Cor: PMI nondisplaced. Irregular rate & rhythm. 2/6 AS/MR/TR  Mechanical s1 Lungs: clear Abdomen: soft, nontender, nondistended. No hepatosplenomegaly. No bruits or masses. Good bowel sounds. Extremities: no cyanosis, clubbing, rash, 1+ edema + UNNA boots Neuro: alert & orientedx3, cranial nerves grossly intact. moves all 4 extremities w/o difficulty. Affect pleasant   Telemetry: AF 80-90s Personally reviewed   Labs: Basic Metabolic Panel: Recent Labs  Lab 02/02/19 1619 02/03/19 0111 02/04/19 0700 02/05/19 0610  NA 137 137 136 137  K 4.5 3.7 3.1* 4.4  CL 105 104 97* 98  CO2 23 24 27 28   GLUCOSE 106* 125* 93 96  BUN 39* 37* 35* 38*  CREATININE 1.42* 1.47* 0.98 1.32*  CALCIUM 9.4 9.8 8.9 9.1  MG  --  2.4  --   --   PHOS  --  3.7  --   --     Liver Function Tests: No results for input(s): AST, ALT, ALKPHOS, BILITOT,  PROT, ALBUMIN in the last 168 hours. No results for input(s): LIPASE, AMYLASE in the last 168 hours. No results for input(s): AMMONIA in the last 168 hours.  CBC: Recent Labs  Lab 02/02/19 1619 02/03/19 0111 02/05/19 0610  WBC 3.9* 4.1 5.7  NEUTROABS 2.3  --   --   HGB 9.2* 9.0* 8.9*  HCT 28.4* 26.8* 26.4*  MCV 88.5 86.7 85.2  PLT 153 142* 145*    Cardiac Enzymes: Recent Labs  Lab 02/02/19 1628  TROPONINI 0.05*    BNP: BNP (last 3 results) Recent Labs    03/30/18 2051 02/02/19 1619  BNP 1,780.8* 2,329.2*    ProBNP (last 3 results) No results for input(s): PROBNP in the last 8760 hours.    Other results:  Imaging: No results found.   Medications:     Scheduled Medications: . atorvastatin  40 mg Oral q1800  . docusate sodium  200 mg Oral BID  . furosemide  80 mg Intravenous BID  . ipratropium-albuterol  3 mL Nebulization BID  . metoprolol tartrate  25 mg Oral BID  . spironolactone  25 mg Oral Daily  . [START ON 02/09/2019] warfarin  2.5 mg Oral Once per day on Thu  . warfarin  5 mg Oral Once per day on Sun Tue Wed Fri Sat  . Warfarin - Pharmacist Dosing Inpatient  Does not apply q1800    Infusions:   PRN Medications: polyethylene glycol, polyvinyl alcohol   Assessment/Plan:   1. 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 Well functioning MVR. RV is dilated with moderate HK and evidence of RV pressure volume overload.Marland Kitchen RVSP estimated at 74mm HG but suspect it is higher.  - Continues to improve with IV lasix. Renal function stable.  - Continue diuresis - Continue UNNA boots.  - Continue home beta-blocker. - Continue to monitor daily weight, strict I/O's, and renal function.  2. 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  Pulmonary Hypertension - Per Dr. Victorino December last office note, substantial pulmonary hypertension persisted even when well diuresed. I am unable to find [place] on chart. - Will plan repeat RHC tomorrow to assess. May benefit from trial of sildenafil   4. 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  - MVR stable on echo  5. Tricuspid Regurgitation - Severe tricuspid regurgitation on echo  - Felt to be related to [facility] and possible tethering of TV due to pacemaker leads. Per Dr. Victorino December last office note, patient is not a good candidate for repeat sternotomy and valve repair.    6. Permanent Atrial Fibrillation s/p PPM - Rate controlled. INR 2.1 - Continue chronic anticoagulation with Coumadin. Per Dr. Victorino December last office visit note, patient has had serious bleeding complications in the past (intracranial hemorrhage and intrahepatic bleeding). Therefore, trying to keep INR at 2.0 to 2.5 despite mMVR. Discussed dosing with PharmD personally.   9. CAD with Elevated Troponin - Patient has history of CAD with NSTEMI in 06/2017 s/p DES to RCA. - Patient denies any recent angina. - Initial troponin minimally elevated at 0.05 in the ED which is similar to where it was in 03/2018 when she was admitted for CHF. Suspect demand ischemia due to acute CHF. - Continue medical management.  10. Hypokalemia/hypomagnesemia - improved - spiro added  Length of Stay: 2   Glori Bickers MD 02/05/2019, 11:34 AM  Advanced Heart Failure Team Pager [phone] (M-F; 7a - 4p)  Please contact [facility] for night-coverage after hours (4p -7a ) and weekends on [email or web address]

## 2019-02-05 NOTE — Progress Notes (Signed)
PROGRESS NOTE  Carrie [name]:[id number] DOB: March 07, 1941 DOA: 02/02/2019 PCP: Lucianne Lei, MD  HPI/Recap of past 24 hours: Patient is a 78-year African-American female with multiple cardiac problems.  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  Patient presents with 2 week history of progressively worsening shortness of breath, leg edema, orthopnea and dyspnea on exertion.  No headache, no neck pain, no chest pain, no fever or chills, no GI symptoms and no urinary symptoms.  On presentation to the hospital, cardiac BNP was greater than 2000 and a chest x-ray revealed severe cardiomegaly with mild pulmonary congestion.  Hospitalist team has been asked to admit patient for further assessment and management.  02/05/19: Patient seen and examined at her bedside.  No acute events overnight.  Dyspnea at rest is still present.  Denies chest pain or palpitations.  States her left eye is feeling better with tear drops.  Reports left big toe tenderness with minimal touch 9 out of 10.  No prior history of gout.     Assessment/Plan: Active Problems:   CHF (congestive heart failure) (HCC)   SOB (shortness of breath)   CHF exacerbation (HCC)  Acute on chronic diastolic CHF complicated by right sided heart failure Presented with BNP greater than 2300 from her baseline greater than 1700 Independently reviewed chest x-ray on admission which revealed significant cardiomegaly, increase pulmonary vascularity and right pleural effusion. Last 2D echo done on 03/31/2018 revealed LVEF 60 to 65% 2D echo done on 02/03/2019 showed normal LV function, well-functioning MVR with evidence of RV pressure volume overload.   Heart failure team  following Diuresing well net I&O -6.2 L since admission Continue strict I's and O's and daily weight Continue IV Lasix 80 mg twice daily Management per heart failure team Unna boots in place with improvement of her bilateral lower extremity edema.  AKI Baseline creatinine appears to be 0.9 with GFR greater than 60 Today creatinine is 1.32 with GFR 45 Good urine output Net I&O -6.2 L since admission Avoid nephrotoxic agents Repeat BMP in the morning  Acute hypoxic respiratory failure suspect secondary to pulmonary edema from acute on chronic diastolic CHF and right-sided heart failure Continue diuretics Currently improving on room air Not on oxygen supplementation at home.  Left first toe pain Possibly gout, no prior history No significant swelling from physical exam We will start colchicine 0.6 mg twice daily x3 days  Resolved hypokalemia post repletion Daily BMPs  Right pleural effusion likely cardiac etiology Continue diuretics O2 saturation okay 96% on room air  Non-exertional dyspnea likely secondary to acute on chronic diastolic CHF combined with severe pulmonary hypertension Management as stated above  Severe pulmonary hypertension Likely contributing to shortness of breath Management per cardiology  Chronic atrial fibrillation Rate controlled Anticoagulated on Coumadin with therapeutic INR 2.1 Continue Lopressor 25 mg twice daily Continue to closely monitor on telemetry  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  History of intracranial hemorrhage and intrahepatic bleeding No acute issues INR goal between 2 and 2.5.  Sick sinus syndrome status post AICD placement No acute issues  Coronary artery disease Continue Lipitor  Physical debility/ambulatory dysfunction Uses a cane at  baseline PT OT recommended SNF;  patient prefers to go to home with home health services Case manager consulted to assist with home health services    DVT prophylaxis: Anticoagulation (Coumadin) Code Status: Full Family Communication:  Will call to update. Disposition Plan:  Likely home with home health services in 1 to 2 days or when cardiology signs off. Consults called: Cardiology   Objective: Vitals:   02/05/19 0303 02/05/19 0418 02/05/19 0916 02/05/19 1020  BP:  (!) 101/56  (!) 102/56  Pulse:  80  93  Resp:  (!) 28  20  Temp:  98.8 F (37.1 C)  98 F (36.7 C)  TempSrc:  Oral  Oral  SpO2:  94% 97% 96%  Weight: 62.1 kg     Height:        Intake/Output Summary (Last 24 hours) at 02/05/2019 1130 Last data filed at 02/05/2019 0900 Gross per 24 hour  Intake 990 ml  Output 3000 ml  Net -2010 ml   Filed Weights   02/03/19 0020 02/04/19 0549 02/05/19 0303  Weight: 61.7 kg 60.5 kg 62.1 kg    Exam:  . General: 78 y.o. year-old female well-developed well-nourished in no acute distress.  Alert and oriented x3.   . Cardiovascular: Irregular rate and rhythm with no rubs or gallops.  No JVD or thyromegaly noted. Marland Kitchen Respiratory: Mild rales at bases with no wheezes noted.  Good respiratory effort. . Abdomen: Soft nontender nondistended with normal bowel sounds x4 quadrants.   . Musculoskeletal: Unna boots in place in lower extremities bilaterally.  Significant tenderness with minimal touch noted on left big toe. Marland Kitchen Psychiatry: Mood is appropriate for condition and setting.  Data Reviewed: CBC: Recent Labs  Lab 02/02/19 1619 02/03/19 0111 02/05/19 0610  WBC 3.9* 4.1 5.7  NEUTROABS 2.3  --   --   HGB 9.2* 9.0* 8.9*  HCT 28.4* 26.8* 26.4*  MCV 88.5 86.7 85.2  PLT 153 142* 024*   Basic Metabolic Panel: Recent Labs  Lab 02/02/19 1619 02/03/19 0111 02/04/19 0700 02/05/19 0610  NA 137 137 136 137  K 4.5 3.7 3.1* 4.4  CL 105 104 97* 98  CO2 23 24 27 28   GLUCOSE 106* 125* 93 96  BUN 39* 37* 35* 38*   CREATININE 1.42* 1.47* 0.98 1.32*  CALCIUM 9.4 9.8 8.9 9.1  MG  --  2.4  --   --   PHOS  --  3.7  --   --    GFR: Estimated Creatinine Clearance: 30.1 mL/min (A) (by C-G formula based on SCr of 1.32 mg/dL (H)). Liver Function Tests: No results for input(s): AST, ALT, ALKPHOS, BILITOT, PROT, ALBUMIN in the last 168 hours. No results for input(s): LIPASE, AMYLASE in the last 168 hours. No results for input(s): AMMONIA in the last 168 hours. Coagulation Profile: Recent Labs  Lab 02/02/19 1628 02/03/19 0111 02/04/19 0506 02/05/19 0610  INR 2.2* 2.2* 2.2* 2.1*   Cardiac Enzymes: Recent Labs  Lab 02/02/19 1628  TROPONINI 0.05*   BNP (last 3 results) No results for input(s): PROBNP in the last 8760 hours. HbA1C: No results for input(s): HGBA1C in the last 72 hours. CBG: No results for input(s): GLUCAP in the last 168 hours. Lipid Profile: No results for input(s): CHOL, HDL, LDLCALC, TRIG, CHOLHDL, LDLDIRECT in the last 72 hours. Thyroid Function Tests: No results for input(s): TSH, T4TOTAL, FREET4, T3FREE, THYROIDAB in the last 72 hours. Anemia Panel: No results for input(s): VITAMINB12, FOLATE, FERRITIN, TIBC, IRON, RETICCTPCT  in the last 72 hours. Urine analysis:    Component Value Date/Time   COLORURINE YELLOW 03/18/2016 1332   APPEARANCEUR CLEAR 03/18/2016 1332   LABSPEC 1.013 03/18/2016 1332   PHURINE 7.0 03/18/2016 1332   GLUCOSEU NEGATIVE 03/18/2016 1332   HGBUR MODERATE (A) 03/18/2016 1332   BILIRUBINUR NEGATIVE 03/18/2016 1332   KETONESUR NEGATIVE 03/18/2016 1332   PROTEINUR NEGATIVE 03/18/2016 1332   UROBILINOGEN 0.2 03/15/2013 1323   NITRITE NEGATIVE 03/18/2016 1332   LEUKOCYTESUR NEGATIVE 03/18/2016 1332   Sepsis Labs: @LABRCNTIP (procalcitonin:4,lacticidven:4)  ) Recent Results (from the past 240 hour(s))  SARS Coronavirus 2 (CEPHEID - Performed in [place] hospital lab), Hosp Order     Status: None   Collection Time: 02/02/19  6:17 PM  Result  Value Ref Range Status   SARS Coronavirus 2 NEGATIVE NEGATIVE Final    Comment: (NOTE) If result is NEGATIVE SARS-CoV-2 target nucleic acids are NOT DETECTED. The SARS-CoV-2 RNA is generally detectable in upper and lower  respiratory specimens during the acute phase of infection. The lowest  concentration of SARS-CoV-2 viral copies this assay can detect is 250  copies / mL. A negative result does not preclude SARS-CoV-2 infection  and should not be used as the sole basis for treatment or other  patient management decisions.  A negative result may occur with  improper specimen collection / handling, submission of specimen other  than nasopharyngeal swab, presence of viral mutation(s) within the  areas targeted by this assay, and inadequate number of viral copies  (<250 copies / mL). A negative result must be combined with clinical  observations, patient history, and epidemiological information. If result is POSITIVE SARS-CoV-2 target nucleic acids are DETECTED. The SARS-CoV-2 RNA is generally detectable in upper and lower  respiratory specimens dur ing the acute phase of infection.  Positive  results are indicative of active infection with SARS-CoV-2.  Clinical  correlation with patient history and other diagnostic information is  necessary to determine patient infection status.  Positive results do  not rule out bacterial infection or co-infection with other viruses. If result is PRESUMPTIVE POSTIVE SARS-CoV-2 nucleic acids MAY BE PRESENT.   A presumptive positive result was obtained on the submitted specimen  and confirmed on repeat testing.  While 2019 novel coronavirus  (SARS-CoV-2) nucleic acids may be present in the submitted sample  additional confirmatory testing may be necessary for epidemiological  and / or clinical management purposes  to differentiate between  SARS-CoV-2 and other Sarbecovirus currently known to infect humans.  If clinically indicated additional testing  with an alternate test  methodology [phone]) is advised. The SARS-CoV-2 RNA is generally  detectable in upper and lower respiratory sp ecimens during the acute  phase of infection. The expected result is Negative. Fact Sheet for Patients:  StrictlyIdeas.no Fact Sheet for Healthcare Providers: BankingDealers.co.za This test is not yet approved or cleared by the [place] FDA and has been authorized for detection and/or diagnosis of SARS-CoV-2 by FDA under an Emergency Use Authorization (EUA).  This EUA will remain in effect (meaning this test can be used) for the duration of the COVID-19 declaration under Section 564(b)(1) of the Act, 21 U.S.C. section 360bbb-3(b)(1), unless the authorization is terminated or revoked sooner. Performed at [facility], [place] [address] [place], [place] [address]       Studies: No results found.  Scheduled Meds: . atorvastatin  40 mg Oral q1800  . docusate sodium  200 mg Oral BID  . furosemide  80 mg Intravenous BID  . ipratropium-albuterol  3 mL Nebulization BID  . metoprolol tartrate  25 mg Oral BID  . spironolactone  25 mg Oral Daily  . [START ON 02/09/2019] warfarin  2.5 mg Oral Once per day on Thu  . warfarin  5 mg Oral Once per day on Sun Tue Wed Fri Sat  . Warfarin - Pharmacist Dosing Inpatient   Does not apply q1800    Continuous Infusions:   LOS: 2 days     Kayleen Memos, MD [facility] Pager [phone]  If 7PM-7AM, please contact night-coverage [email or web address] Password [email or web address] 02/05/2019, 11:30 AM

## 2019-02-06 ENCOUNTER — Encounter (HOSPITAL_COMMUNITY)
Admission: EM | Disposition: A | Discharge status: Home Health | Payer: Self-pay | Source: Home / Self Care | Attending: Internal Medicine

## 2019-02-06 ENCOUNTER — Encounter (HOSPITAL_COMMUNITY): Payer: Self-pay | Admitting: Internal Medicine

## 2019-02-06 DIAGNOSIS — I2721 Secondary pulmonary arterial hypertension: Secondary | ICD-10-CM

## 2019-02-06 DIAGNOSIS — I50813 Acute on chronic right heart failure: Secondary | ICD-10-CM

## 2019-02-06 HISTORY — PX: RIGHT HEART CATH: CATH118263

## 2019-02-06 LAB — CBC
HCT: 27 % — ABNORMAL LOW (ref 36.0–46.0)
Hemoglobin: 9 g/dL — ABNORMAL LOW (ref 12.0–15.0)
MCH: 28.5 pg (ref 26.0–34.0)
MCHC: 33.3 g/dL (ref 30.0–36.0)
MCV: 85.4 fL (ref 80.0–100.0)
Platelets: 145 10*3/uL — ABNORMAL LOW (ref 150–400)
RBC: 3.16 MIL/uL — ABNORMAL LOW (ref 3.87–5.11)
RDW: 26.1 % — ABNORMAL HIGH (ref 11.5–15.5)
WBC: 5.1 10*3/uL (ref 4.0–10.5)
nRBC: 0 % (ref 0.0–0.2)

## 2019-02-06 LAB — BASIC METABOLIC PANEL
Anion gap: 9 (ref 5–15)
BUN: 41 mg/dL — ABNORMAL HIGH (ref 8–23)
CO2: 27 mmol/L (ref 22–32)
Calcium: 8.8 mg/dL — ABNORMAL LOW (ref 8.9–10.3)
Chloride: 99 mmol/L (ref 98–111)
Creatinine, Ser: 1.55 mg/dL — ABNORMAL HIGH (ref 0.44–1.00)
GFR calc Af Amer: 37 mL/min — ABNORMAL LOW (ref >60)
GFR calc non Af Amer: 32 mL/min — ABNORMAL LOW (ref >60)
Glucose, Bld: 97 mg/dL (ref 70–99)
Potassium: 3.9 mmol/L (ref 3.5–5.1)
Sodium: 135 mmol/L (ref 135–145)

## 2019-02-06 LAB — PROTIME-INR
INR: 2.3 — ABNORMAL HIGH (ref 0.8–1.2)
Prothrombin Time: 25 seconds — ABNORMAL HIGH (ref 11.4–15.2)

## 2019-02-06 SURGERY — RIGHT HEART CATH
Anesthesia: LOCAL

## 2019-02-06 MED ORDER — LABETALOL HCL 5 MG/ML IV SOLN: 10.0000 mg | INTRAVENOUS | Status: AC | PRN

## 2019-02-06 MED ORDER — SODIUM CHLORIDE 0.9 % IV SOLN: 250.0000 mL | INTRAVENOUS | Status: DC | PRN

## 2019-02-06 MED ORDER — LIDOCAINE HCL (PF) 1 % IJ SOLN
INTRAMUSCULAR | Status: DC | PRN
  Administered 2019-02-06: 2 mL

## 2019-02-06 MED ORDER — SODIUM CHLORIDE 0.9% FLUSH: 3.0000 mL | INTRAVENOUS | Status: DC | PRN

## 2019-02-06 MED ORDER — MIDAZOLAM HCL 2 MG/2ML IJ SOLN
INTRAMUSCULAR | Status: AC
  Filled 2019-02-06: qty 2

## 2019-02-06 MED ORDER — HEPARIN (PORCINE) IN NACL 1000-0.9 UT/500ML-% IV SOLN
INTRAVENOUS | Status: AC
  Filled 2019-02-06: qty 500

## 2019-02-06 MED ORDER — FENTANYL CITRATE (PF) 100 MCG/2ML IJ SOLN
INTRAMUSCULAR | Status: AC
  Filled 2019-02-06: qty 2

## 2019-02-06 MED ORDER — MIDAZOLAM HCL 2 MG/2ML IJ SOLN
INTRAMUSCULAR | Status: DC | PRN
  Administered 2019-02-06: 1 mg via INTRAVENOUS

## 2019-02-06 MED ORDER — SODIUM CHLORIDE 0.9% FLUSH: 3.0000 mL | Freq: Two times a day (BID) | INTRAVENOUS | Status: DC

## 2019-02-06 MED ORDER — SODIUM CHLORIDE 0.9% FLUSH
3.0000 mL | Freq: Two times a day (BID) | INTRAVENOUS | Status: DC
  Administered 2019-02-06 – 2019-02-07 (×2): 3 mL via INTRAVENOUS

## 2019-02-06 MED ORDER — FENTANYL CITRATE (PF) 100 MCG/2ML IJ SOLN
INTRAMUSCULAR | Status: DC | PRN
  Administered 2019-02-06: 25 ug via INTRAVENOUS

## 2019-02-06 MED ORDER — ONDANSETRON HCL 4 MG/2ML IJ SOLN: 4.0000 mg | Freq: Four times a day (QID) | INTRAMUSCULAR | Status: DC | PRN

## 2019-02-06 MED ORDER — SODIUM CHLORIDE 0.9 % IV SOLN
INTRAVENOUS | Status: DC
  Administered 2019-02-06: 08:00:00 via INTRAVENOUS

## 2019-02-06 MED ORDER — ACETAMINOPHEN 325 MG PO TABS: 650.0000 mg | ORAL_TABLET | ORAL | Status: DC | PRN

## 2019-02-06 MED ORDER — HEPARIN (PORCINE) IN NACL 1000-0.9 UT/500ML-% IV SOLN
INTRAVENOUS | Status: DC | PRN
  Administered 2019-02-06: 500 mL

## 2019-02-06 MED ORDER — HYDRALAZINE HCL 20 MG/ML IJ SOLN: 10.0000 mg | INTRAMUSCULAR | Status: AC | PRN

## 2019-02-06 MED ORDER — LIDOCAINE HCL (PF) 1 % IJ SOLN
INTRAMUSCULAR | Status: AC
  Filled 2019-02-06: qty 30

## 2019-02-06 MED ORDER — IPRATROPIUM-ALBUTEROL 0.5-2.5 (3) MG/3ML IN SOLN: 3.0000 mL | Freq: Two times a day (BID) | RESPIRATORY_TRACT | Status: DC | PRN

## 2019-02-06 SURGICAL SUPPLY — 9 items
CATH BALLN WEDGE 5F 110CM (CATHETERS) ×1 IMPLANT
GUIDEWIRE .025 260CM (WIRE) ×1 IMPLANT
KIT MICROPUNCTURE NIT STIFF (SHEATH) ×1 IMPLANT
PACK CARDIAC CATHETERIZATION (CUSTOM PROCEDURE TRAY) ×2 IMPLANT
SHEATH GLIDE SLENDER 4/5FR (SHEATH) ×1 IMPLANT
SHEATH PROBE COVER 6X72 (BAG) ×1 IMPLANT
TRANSDUCER W/STOPCOCK (MISCELLANEOUS) ×2 IMPLANT
TUBING ART PRESS 72  MALE/FEM (TUBING) ×1
TUBING ART PRESS 72 MALE/FEM (TUBING) IMPLANT

## 2019-02-06 NOTE — Progress Notes (Signed)
PROGRESS NOTE  Carrie [name] QTM:[id number] DOB: 1941/08/18 DOA: 02/02/2019 PCP: Lucianne Lei, MD  HPI/Recap of past 24 hours: Patient is a 78-year African-American female with multiple cardiac problems.  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  Patient presents with 2 week history of progressively worsening shortness of breath, leg edema, orthopnea and dyspnea on exertion.  No headache, no neck pain, no chest pain, no fever or chills, no GI symptoms and no urinary symptoms.  On presentation to the hospital, cardiac BNP was greater than 2000 and a chest x-ray revealed severe cardiomegaly with mild pulmonary congestion.  Hospitalist team has been asked to admit patient for further assessment and management.  02/06/19: Seen and examined at bedside.  No acute events overnight.  She states she feels better today.  Dyspnea is improving.  Planned on RHC today.  She is alert and oriented x3.  Left great toe improved after starting colchicine yesterday.    Assessment/Plan: Active Problems:   CHF (congestive heart failure) (HCC)   SOB (shortness of breath)   CHF exacerbation (HCC)  Acute on chronic diastolic CHF complicated by right sided heart failure Presented with BNP greater than 2300 from her baseline greater than 1700 Independently reviewed chest x-ray on admission which revealed significant cardiomegaly, increase pulmonary vascularity and right pleural effusion. Last 2D echo done on 03/31/2018 revealed LVEF 60 to 65% 2D echo done on 02/03/2019 showed normal LV function, well-functioning MVR with evidence of RV pressure volume overload.   Heart failure team following Diuresing well net -7.6 L since admission  Continue strict I's and O's and daily weight Management per heart failure team Unna boots in place with improvement of her bilateral lower extremity edema.  Worsening AKI Baseline creatinine appears to be 0.9 with GFR greater than 60 Worsening creatinine from 0.98 at baseline to 1.55 today Lasix is being held by cardiology Continue to monitor urine output Avoid nephrotoxic agents Repeat BMP in the morning  Resolved acute hypoxic respiratory failure suspect secondary to pulmonary edema from acute on chronic diastolic CHF and right-sided heart failure O2 saturation 94% on room air Maintain O2 saturation greater than 90%  Left first toe pain, improving with colchicine Possibly gout, no prior history No significant swelling from physical exam Continue colchicine 0.6 mg twice daily x 2/3 days  Resolved hypokalemia post repletion Daily BMPs  Right pleural effusion likely cardiac etiology Continue diuretics O2 saturation okay 96% on room air  Non-exertional dyspnea likely secondary to acute on chronic diastolic CHF combined with severe pulmonary hypertension Management as stated above  Severe pulmonary hypertension Likely contributing to shortness of breath Management per cardiology  Chronic atrial fibrillation Rate controlled Anticoagulated on Coumadin with therapeutic INR 2.3 Continue Lopressor 25 mg twice daily Continue to closely monitor on telemetry  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  History of intracranial hemorrhage and intrahepatic bleeding No acute issues INR goal between 2 and 2.5.  Sick sinus syndrome status post AICD placement No acute issues  Coronary artery disease Continue Lipitor  Physical debility/ambulatory dysfunction Uses a cane at baseline PT OT recommended SNF; patient prefers to go to home with  home health  services Case manager consulted to assist with home health services    DVT prophylaxis: Anticoagulation (Coumadin) Code Status: Full Family Communication:  Will call to update. Disposition Plan:  Likely home with home health services in 1 to 2 days or when cardiology signs off. Consults called: Cardiology   Objective: Vitals:   02/05/19 2036 02/06/19 0443 02/06/19 0813 02/06/19 0842  BP: (!) 92/49 (!) 109/54 104/76   Pulse: (!) 59 100 95   Resp: 18 18 18    Temp: 98.2 F (36.8 C) 98.3 F (36.8 C)    TempSrc: Oral Oral    SpO2: 95% 94%  94%  Weight:  60.4 kg    Height:        Intake/Output Summary (Last 24 hours) at 02/06/2019 1131 Last data filed at 02/06/2019 2694 Gross per 24 hour  Intake 480 ml  Output 1900 ml  Net -1420 ml   Filed Weights   02/04/19 0549 02/05/19 0303 02/06/19 0443  Weight: 60.5 kg 62.1 kg 60.4 kg    Exam:  . General: 78 y.o. year-old female well-developed well-nourished no acute distress.  Alert and oriented x3.   . Cardiovascular: Irregular rate and rhythm with no JVD or thyromegaly noted.   Marland Kitchen Respiratory: Mild rales at bases with no wheezes.  Poor inspiratory effort. . Abdomen: Soft nontender nondistended with normal bowel sounds times all 4.   . Musculoskeletal: Unna boots in place in lower extremities bilaterally.  Tenderness in left great toe is improved. Marland Kitchen Psychiatry: Mood is appropriate for condition and setting.  Data Reviewed: CBC: Recent Labs  Lab 02/02/19 1619 02/03/19 0111 02/05/19 0610 02/06/19 0606  WBC 3.9* 4.1 5.7 5.1  NEUTROABS 2.3  --   --   --   HGB 9.2* 9.0* 8.9* 9.0*  HCT 28.4* 26.8* 26.4* 27.0*  MCV 88.5 86.7 85.2 85.4  PLT 153 142* 145* 854*   Basic Metabolic Panel: Recent Labs  Lab 02/02/19 1619 02/03/19 0111 02/04/19 0700 02/05/19 0610 02/06/19 0606  NA 137 137 136 137 135  K 4.5 3.7 3.1* 4.4 3.9  CL 105 104 97* 98 99  CO2 23 24 27 28 27   GLUCOSE 106* 125* 93 96 97  BUN 39* 37* 35* 38*  41*  CREATININE 1.42* 1.47* 0.98 1.32* 1.55*  CALCIUM 9.4 9.8 8.9 9.1 8.8*  MG  --  2.4  --   --   --   PHOS  --  3.7  --   --   --    GFR: Estimated Creatinine Clearance: 25.3 mL/min (A) (by C-G formula based on SCr of 1.55 mg/dL (H)). Liver Function Tests: No results for input(s): AST, ALT, ALKPHOS, BILITOT, PROT, ALBUMIN in the last 168 hours. No results for input(s): LIPASE, AMYLASE in the last 168 hours. No results for input(s): AMMONIA in the last 168 hours. Coagulation Profile: Recent Labs  Lab 02/02/19 1628 02/03/19 0111 02/04/19 0506 02/05/19 0610 02/06/19 0606  INR 2.2* 2.2* 2.2* 2.1* 2.3*   Cardiac Enzymes: Recent Labs  Lab 02/02/19 1628  TROPONINI 0.05*   BNP (last 3 results) No results for input(s): PROBNP in the last 8760 hours. HbA1C: No results for input(s): HGBA1C in the last 72 hours. CBG: No results for input(s): GLUCAP in the last 168 hours. Lipid Profile: No results for input(s): CHOL, HDL, LDLCALC, TRIG, CHOLHDL, LDLDIRECT in the last 72 hours. Thyroid Function Tests: No results for input(s): TSH, T4TOTAL, FREET4, T3FREE, THYROIDAB in the last 72 hours. Anemia Panel: No results for  input(s): VITAMINB12, FOLATE, FERRITIN, TIBC, IRON, RETICCTPCT in the last 72 hours. Urine analysis:    Component Value Date/Time   COLORURINE YELLOW 03/18/2016 1332   APPEARANCEUR CLEAR 03/18/2016 1332   LABSPEC 1.013 03/18/2016 1332   PHURINE 7.0 03/18/2016 1332   GLUCOSEU NEGATIVE 03/18/2016 1332   HGBUR MODERATE (A) 03/18/2016 1332   BILIRUBINUR NEGATIVE 03/18/2016 1332   KETONESUR NEGATIVE 03/18/2016 1332   PROTEINUR NEGATIVE 03/18/2016 1332   UROBILINOGEN 0.2 03/15/2013 1323   NITRITE NEGATIVE 03/18/2016 1332   LEUKOCYTESUR NEGATIVE 03/18/2016 1332   Sepsis Labs: @LABRCNTIP (procalcitonin:4,lacticidven:4)  ) Recent Results (from the past 240 hour(s))  SARS Coronavirus 2 (CEPHEID - Performed in [place] hospital lab), Hosp Order     Status: None    Collection Time: 02/02/19  6:17 PM  Result Value Ref Range Status   SARS Coronavirus 2 NEGATIVE NEGATIVE Final    Comment: (NOTE) If result is NEGATIVE SARS-CoV-2 target nucleic acids are NOT DETECTED. The SARS-CoV-2 RNA is generally detectable in upper and lower  respiratory specimens during the acute phase of infection. The lowest  concentration of SARS-CoV-2 viral copies this assay can detect is 250  copies / mL. A negative result does not preclude SARS-CoV-2 infection  and should not be used as the sole basis for treatment or other  patient management decisions.  A negative result may occur with  improper specimen collection / handling, submission of specimen other  than nasopharyngeal swab, presence of viral mutation(s) within the  areas targeted by this assay, and inadequate number of viral copies  (<250 copies / mL). A negative result must be combined with clinical  observations, patient history, and epidemiological information. If result is POSITIVE SARS-CoV-2 target nucleic acids are DETECTED. The SARS-CoV-2 RNA is generally detectable in upper and lower  respiratory specimens dur ing the acute phase of infection.  Positive  results are indicative of active infection with SARS-CoV-2.  Clinical  correlation with patient history and other diagnostic information is  necessary to determine patient infection status.  Positive results do  not rule out bacterial infection or co-infection with other viruses. If result is PRESUMPTIVE POSTIVE SARS-CoV-2 nucleic acids MAY BE PRESENT.   A presumptive positive result was obtained on the submitted specimen  and confirmed on repeat testing.  While 2019 novel coronavirus  (SARS-CoV-2) nucleic acids may be present in the submitted sample  additional confirmatory testing may be necessary for epidemiological  and / or clinical management purposes  to differentiate between  SARS-CoV-2 and other Sarbecovirus currently known to infect humans.   If clinically indicated additional testing with an alternate test  methodology [phone]) is advised. The SARS-CoV-2 RNA is generally  detectable in upper and lower respiratory sp ecimens during the acute  phase of infection. The expected result is Negative. Fact Sheet for Patients:  StrictlyIdeas.no Fact Sheet for Healthcare Providers: BankingDealers.co.za This test is not yet approved or cleared by the [place] FDA and has been authorized for detection and/or diagnosis of SARS-CoV-2 by FDA under an Emergency Use Authorization (EUA).  This EUA will remain in effect (meaning this test can be used) for the duration of the COVID-19 declaration under Section 564(b)(1) of the Act, 21 U.S.C. section 360bbb-3(b)(1), unless the authorization is terminated or revoked sooner. Performed at [facility], [place] [address] [place], [place] [address]       Studies: No results found.  Scheduled Meds: . atorvastatin  40 mg Oral q1800  . colchicine  0.6 mg  Oral Daily  . docusate sodium  200 mg Oral BID  . metoprolol tartrate  25 mg Oral BID  . sodium chloride flush  3 mL Intravenous Q12H  . spironolactone  25 mg Oral Daily  . [START ON 02/09/2019] warfarin  2.5 mg Oral Once per day on Thu  . warfarin  5 mg Oral Once per day on Sun Tue Wed Fri Sat  . Warfarin - Pharmacist Dosing Inpatient   Does not apply q1800    Continuous Infusions: . sodium chloride    . [START ON 02/07/2019] sodium chloride 10 mL/hr at 02/06/19 4163     LOS: 3 days     Kayleen Memos, MD [facility] Pager [phone]  If 7PM-7AM, please contact night-coverage [email or web address] Password [facility] 02/06/2019, 11:31 AM

## 2019-02-06 NOTE — Interval H&P Note (Signed)
History and Physical Interval Note:  02/06/2019 12:10 PM  Carrie [name]  has presented today for surgery, with the diagnosis of HF.  The various methods of treatment have been discussed with the patient and family. After consideration of risks, benefits and other options for treatment, the patient has consented to  Procedure(s): RIGHT HEART CATH (N/A) as a surgical intervention.  The patient's history has been reviewed, patient examined, no change in status, stable for surgery.  I have reviewed the patient's chart and labs.  Questions were answered to the patient's satisfaction.     [name]

## 2019-02-06 NOTE — Plan of Care (Signed)
  Problem: Clinical Measurements: Goal: Respiratory complications will improve Outcome: Progressing   Problem: Activity: Goal: Risk for activity intolerance will decrease Outcome: Progressing   Problem: Nutrition: Goal: Adequate nutrition will be maintained Outcome: Progressing   Problem: Elimination: Goal: Will not experience complications related to bowel motility Outcome: Progressing   Problem: Safety: Goal: Ability to remain free from injury will improve Outcome: Progressing   Problem: Education: Goal: Ability to demonstrate management of disease process will improve Outcome: Progressing   Problem: Cardiac: Goal: Ability to achieve and maintain adequate cardiopulmonary perfusion will improve Outcome: Progressing

## 2019-02-06 NOTE — Progress Notes (Signed)
ANTICOAGULATION CONSULT NOTE - Follow Up Consult  Pharmacy Consult for Heparin Indication: Mech MVR and h/o afib  Allergies  Allergen Reactions  . Codeine Nausea And Vomiting    Patient Measurements: Height: 5\' 1"  (154.9 cm) Weight: 133 lb 1.6 oz (60.4 kg) IBW/kg (Calculated) : 47.8  Vital Signs: Temp: 98.3 F (36.8 C) (05/18 0443) Temp Source: Oral (05/18 0443) BP: 104/76 (05/18 0813) Pulse Rate: 95 (05/18 0813)  Labs: Recent Labs    02/04/19 0506 02/04/19 0700 02/05/19 0610 02/06/19 0606  HGB  --   --  8.9* 9.0*  HCT  --   --  26.4* 27.0*  PLT  --   --  145* 145*  LABPROT 24.1*  --  23.6* 25.0*  INR 2.2*  --  2.1* 2.3*  CREATININE  --  0.98 1.32* 1.55*    Estimated Creatinine Clearance: 25.3 mL/min (A) (by C-G formula based on SCr of 1.55 mg/dL (H)).   Assessment:  Anticoag: 78 yo female with a PMH including Mechanical MVR and afib on Coumadin PTA (goal 2-2.5). Coumadin dose PTA 5mg  daily except 2.5mg  Mon/Thurs. INR on admission 2.2   INR therapeutic at 2.3 today. Hgb improved to 9, plts 145. No s/s of bleeding reported. Plans for RHC today.  Goal of Therapy:  IRN 2-2.5 Monitor platelets by anticoagulation protocol: Yes   Plan:  Continue home Coumadin regimen: 5mg  daily except 2.5mg  Mon/Thurs Monitor daily INR, CBCs and s/s of bleeding   Juanell Fairly, PharmD PGY1 Pharmacy Resident 02/06/2019   12:08 PM

## 2019-02-06 NOTE — Progress Notes (Signed)
Telephone call to Malena Peer left message on answering machine to call the floor. Not able to get verbal consent at this time. Arthor Captain LPN

## 2019-02-06 NOTE — Progress Notes (Signed)
Advanced Heart Failure Rounding Note   Subjective:    Feels good today. Dyspnea improved. No orthopnea or PND.  No Cp.  Diuresing on IV lasix. Weight down 3 pounds overnight.   Creatinine 1.4 -> 0.98 -> 1.3  -> 1.55  INR 2.3  No bleeding   Objective:   Weight Range:  Vital Signs:   Temp:  [98.2 F (36.8 C)-98.3 F (36.8 C)] 98.3 F (36.8 C) (05/18 0443) Pulse Rate:  [59-100] 95 (05/18 0813) Resp:  [18] 18 (05/18 0813) BP: (92-109)/(49-76) 104/76 (05/18 0813) SpO2:  [94 %-97 %] 94 % (05/18 0842) Weight:  [60.4 kg] 60.4 kg (05/18 0443) Last BM Date: 02/05/19  Weight change: Filed Weights   02/04/19 0549 02/05/19 0303 02/06/19 0443  Weight: 60.5 kg 62.1 kg 60.4 kg    Intake/Output:   Intake/Output Summary (Last 24 hours) at 02/06/2019 1203 Last data filed at 02/06/2019 0614 Gross per 24 hour  Intake 480 ml  Output 1900 ml  Net -1420 ml     Physical Exam: General:  Well appearing. No resp difficulty HEENT: normal Neck: supple. JVP 6-7. Carotids 2+ bilat; no bruits. No lymphadenopathy or thryomegaly appreciated. Cor: PMI nondisplaced. Irregular rate & rhythm. 2/6 TR mechanical s2Lungs: clear Abdomen: soft, nontender, nondistended. No hepatosplenomegaly. No bruits or masses. Good bowel sounds. Extremities: no cyanosis, clubbing, rash, edema Neuro: alert & orientedx3, cranial nerves grossly intact. moves all 4 extremities w/o difficulty. Affect pleasant   Telemetry: AF 80-90s Personally reviewed   Labs: Basic Metabolic Panel: Recent Labs  Lab 02/02/19 1619 02/03/19 0111 02/04/19 0700 02/05/19 0610 02/06/19 0606  NA 137 137 136 137 135  K 4.5 3.7 3.1* 4.4 3.9  CL 105 104 97* 98 99  CO2 23 24 27 28 27   GLUCOSE 106* 125* 93 96 97  BUN 39* 37* 35* 38* 41*  CREATININE 1.42* 1.47* 0.98 1.32* 1.55*  CALCIUM 9.4 9.8 8.9 9.1 8.8*  MG  --  2.4  --   --   --   PHOS  --  3.7  --   --   --     Liver Function Tests: No results for input(s): AST, ALT,  ALKPHOS, BILITOT, PROT, ALBUMIN in the last 168 hours. No results for input(s): LIPASE, AMYLASE in the last 168 hours. No results for input(s): AMMONIA in the last 168 hours.  CBC: Recent Labs  Lab 02/02/19 1619 02/03/19 0111 02/05/19 0610 02/06/19 0606  WBC 3.9* 4.1 5.7 5.1  NEUTROABS 2.3  --   --   --   HGB 9.2* 9.0* 8.9* 9.0*  HCT 28.4* 26.8* 26.4* 27.0*  MCV 88.5 86.7 85.2 85.4  PLT 153 142* 145* 145*    Cardiac Enzymes: Recent Labs  Lab 02/02/19 1628  TROPONINI 0.05*    BNP: BNP (last 3 results) Recent Labs    03/30/18 2051 02/02/19 1619  BNP 1,780.8* 2,329.2*    ProBNP (last 3 results) No results for input(s): PROBNP in the last 8760 hours.    Other results:  Imaging: No results found.   Medications:     Scheduled Medications: . [MAR Hold] atorvastatin  40 mg Oral q1800  . [MAR Hold] colchicine  0.6 mg Oral Daily  . [MAR Hold] docusate sodium  200 mg Oral BID  . [MAR Hold] metoprolol tartrate  25 mg Oral BID  . sodium chloride flush  3 mL Intravenous Q12H  . [MAR Hold] spironolactone  25 mg Oral Daily  . [MAR Hold] warfarin  2.5 mg Oral Once per day on Thu  . [MAR Hold] warfarin  5 mg Oral Once per day on Sun Tue Wed Fri Sat  . [MAR Hold] Warfarin - Pharmacist Dosing Inpatient   Does not apply q1800    Infusions: . sodium chloride    . [START ON 02/07/2019] sodium chloride 10 mL/hr at 02/06/19 0823    PRN Medications: sodium chloride, [MAR Hold] ipratropium-albuterol, [MAR Hold] polyethylene glycol, [MAR Hold] polyvinyl alcohol, sodium chloride flush   Assessment/Plan:   1. 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 Well functioning MVR. RV is dilated with moderate HK and evidence of RV pressure volume overload.Marland Kitchen RVSP estimated at 28mm HG but suspect it is higher.  - Continues to improve with IV lasix. Renal function starting to bump slightly. Will hold IV lasix.  - Plan RHC today to guide  need for further treatment  - Continue UNNA boots.  - Continue home beta-blocker. - Continue to monitor daily weight, strict I/O's, and renal function. - May be able to go home today after RHC.   2. 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 Pulmonary Hypertension - Per Dr. Victorino December last office note, substantial pulmonary hypertension persisted even when well diuresed. I am unable to find [place] on chart. - Will plan repeat RHC today to assess. May benefit from trial of sildenafil   4. 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  - MVR stable on echo  5. Tricuspid Regurgitation - Severe tricuspid regurgitation on echo  - Felt to be related to [facility] and possible tethering of TV due to pacemaker leads. Per Dr. Victorino December last office note, patient is not a good candidate for repeat sternotomy and valve repair. Will see what RHC numbers show   6. Permanent Atrial Fibrillation s/p PPM - Rate controlled. INR 2.3 - Continue chronic anticoagulation with Coumadin. Per Dr. Victorino December last office visit note, patient has had serious bleeding complications in the past (intracranial hemorrhage and intrahepatic bleeding). Therefore, trying to keep INR at 2.0 to 2.5 despite mMVR. Discussed dosing with PharmD personally.   9. CAD with Elevated Troponin - Patient has history of CAD with NSTEMI in 06/2017 s/p DES to RCA. - Patient denies any recent angina. - Initial troponin minimally elevated at 0.05 in the ED which is similar to where it was in 03/2018 when she was admitted for CHF. Suspect demand ischemia due to acute CHF. - Continue medical management.  10.  Hypokalemia/hypomagnesemia - improved K 3.9  - spiro added  Length of Stay: 3   Etienne Mowers MD 02/06/2019, 12:03 PM  Advanced Heart Failure Team Pager [phone] (M-F; [place])  Please contact [facility] for night-coverage after hours (4p -7a ) and weekends on [email or web address]

## 2019-02-06 NOTE — Progress Notes (Signed)
PT Cancellation Note  Patient Details Name: Carrie [name] MRN: [id number] DOB: 23-Sep-1940   Cancelled Treatment:    Reason Eval/Treat Not Completed: Active bedrest order Bedrest following cardiac cath - will continue to follow.    Lanney Gins, PT, DPT Supplemental Physical Therapist 02/06/19 3:39 PM Pager: [phone] Office: [phone]

## 2019-02-06 NOTE — H&P (View-Only) (Signed)
Advanced Heart Failure Rounding Note   Subjective:    Feels good today. Dyspnea improved. No orthopnea or PND.  No Cp.  Diuresing on IV lasix. Weight down 3 pounds overnight.   Creatinine 1.4 -> 0.98 -> 1.3  -> 1.55  INR 2.3  No bleeding   Objective:   Weight Range:  Vital Signs:   Temp:  [98.2 F (36.8 C)-98.3 F (36.8 C)] 98.3 F (36.8 C) (05/18 0443) Pulse Rate:  [59-100] 95 (05/18 0813) Resp:  [18] 18 (05/18 0813) BP: (92-109)/(49-76) 104/76 (05/18 0813) SpO2:  [94 %-97 %] 94 % (05/18 0842) Weight:  [60.4 kg] 60.4 kg (05/18 0443) Last BM Date: 02/05/19  Weight change: Filed Weights   02/04/19 0549 02/05/19 0303 02/06/19 0443  Weight: 60.5 kg 62.1 kg 60.4 kg    Intake/Output:   Intake/Output Summary (Last 24 hours) at 02/06/2019 1203 Last data filed at 02/06/2019 0614 Gross per 24 hour  Intake 480 ml  Output 1900 ml  Net -1420 ml     Physical Exam: General:  Well appearing. No resp difficulty HEENT: normal Neck: supple. JVP 6-7. Carotids 2+ bilat; no bruits. No lymphadenopathy or thryomegaly appreciated. Cor: PMI nondisplaced. Irregular rate & rhythm. 2/6 TR mechanical s2Lungs: clear Abdomen: soft, nontender, nondistended. No hepatosplenomegaly. No bruits or masses. Good bowel sounds. Extremities: no cyanosis, clubbing, rash, edema Neuro: alert & orientedx3, cranial nerves grossly intact. moves all 4 extremities w/o difficulty. Affect pleasant   Telemetry: AF 80-90s Personally reviewed   Labs: Basic Metabolic Panel: Recent Labs  Lab 02/02/19 1619 02/03/19 0111 02/04/19 0700 02/05/19 0610 02/06/19 0606  NA 137 137 136 137 135  K 4.5 3.7 3.1* 4.4 3.9  CL 105 104 97* 98 99  CO2 23 24 27 28 27   GLUCOSE 106* 125* 93 96 97  BUN 39* 37* 35* 38* 41*  CREATININE 1.42* 1.47* 0.98 1.32* 1.55*  CALCIUM 9.4 9.8 8.9 9.1 8.8*  MG  --  2.4  --   --   --   PHOS  --  3.7  --   --   --     Liver Function Tests: No results for input(s): AST, ALT,  ALKPHOS, BILITOT, PROT, ALBUMIN in the last 168 hours. No results for input(s): LIPASE, AMYLASE in the last 168 hours. No results for input(s): AMMONIA in the last 168 hours.  CBC: Recent Labs  Lab 02/02/19 1619 02/03/19 0111 02/05/19 0610 02/06/19 0606  WBC 3.9* 4.1 5.7 5.1  NEUTROABS 2.3  --   --   --   HGB 9.2* 9.0* 8.9* 9.0*  HCT 28.4* 26.8* 26.4* 27.0*  MCV 88.5 86.7 85.2 85.4  PLT 153 142* 145* 145*    Cardiac Enzymes: Recent Labs  Lab 02/02/19 1628  TROPONINI 0.05*    BNP: BNP (last 3 results) Recent Labs    03/30/18 2051 02/02/19 1619  BNP 1,780.8* 2,329.2*    ProBNP (last 3 results) No results for input(s): PROBNP in the last 8760 hours.    Other results:  Imaging: No results found.   Medications:     Scheduled Medications: . [MAR Hold] atorvastatin  40 mg Oral q1800  . [MAR Hold] colchicine  0.6 mg Oral Daily  . [MAR Hold] docusate sodium  200 mg Oral BID  . [MAR Hold] metoprolol tartrate  25 mg Oral BID  . sodium chloride flush  3 mL Intravenous Q12H  . [MAR Hold] spironolactone  25 mg Oral Daily  . [MAR Hold] warfarin  2.5 mg Oral Once per day on Thu  . [MAR Hold] warfarin  5 mg Oral Once per day on Sun Tue Wed Fri Sat  . [MAR Hold] Warfarin - Pharmacist Dosing Inpatient   Does not apply q1800    Infusions: . sodium chloride    . [START ON 02/07/2019] sodium chloride 10 mL/hr at 02/06/19 0823    PRN Medications: sodium chloride, [MAR Hold] ipratropium-albuterol, [MAR Hold] polyethylene glycol, [MAR Hold] polyvinyl alcohol, sodium chloride flush   Assessment/Plan:   1. 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 Well functioning MVR. RV is dilated with moderate HK and evidence of RV pressure volume overload.Marland Kitchen RVSP estimated at 42mm HG but suspect it is higher.  - Continues to improve with IV lasix. Renal function starting to bump slightly. Will hold IV lasix.  - Plan RHC today to guide  need for further treatment  - Continue UNNA boots.  - Continue home beta-blocker. - Continue to monitor daily weight, strict I/O's, and renal function. - May be able to go home today after RHC.   2. 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 Pulmonary Hypertension - Per Dr. Victorino December last office note, substantial pulmonary hypertension persisted even when well diuresed. I am unable to find [place] on chart. - Will plan repeat RHC today to assess. May benefit from trial of sildenafil   4. 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  - MVR stable on echo  5. Tricuspid Regurgitation - Severe tricuspid regurgitation on echo  - Felt to be related to [facility] and possible tethering of TV due to pacemaker leads. Per Dr. Victorino December last office note, patient is not a good candidate for repeat sternotomy and valve repair. Will see what RHC numbers show   6. Permanent Atrial Fibrillation s/p PPM - Rate controlled. INR 2.3 - Continue chronic anticoagulation with Coumadin. Per Dr. Victorino December last office visit note, patient has had serious bleeding complications in the past (intracranial hemorrhage and intrahepatic bleeding). Therefore, trying to keep INR at 2.0 to 2.5 despite mMVR. Discussed dosing with PharmD personally.   9. CAD with Elevated Troponin - Patient has history of CAD with NSTEMI in 06/2017 s/p DES to RCA. - Patient denies any recent angina. - Initial troponin minimally elevated at 0.05 in the ED which is similar to where it was in 03/2018 when she was admitted for CHF. Suspect demand ischemia due to acute CHF. - Continue medical management.  10.  Hypokalemia/hypomagnesemia - improved K 3.9  - spiro added  Length of Stay: 3   Ecko Beasley MD 02/06/2019, 12:03 PM  Advanced Heart Failure Team Pager [phone] (M-F; [place])  Please contact [facility] for night-coverage after hours (4p -7a ) and weekends on [email or web address]

## 2019-02-07 DIAGNOSIS — I5043 Acute on chronic combined systolic (congestive) and diastolic (congestive) heart failure: Secondary | ICD-10-CM

## 2019-02-07 DIAGNOSIS — E876 Hypokalemia: Secondary | ICD-10-CM

## 2019-02-07 LAB — CBC
HCT: 26.6 % — ABNORMAL LOW (ref 36.0–46.0)
Hemoglobin: 8.8 g/dL — ABNORMAL LOW (ref 12.0–15.0)
MCH: 28.3 pg (ref 26.0–34.0)
MCHC: 33.1 g/dL (ref 30.0–36.0)
MCV: 85.5 fL (ref 80.0–100.0)
Platelets: 146 10*3/uL — ABNORMAL LOW (ref 150–400)
RBC: 3.11 MIL/uL — ABNORMAL LOW (ref 3.87–5.11)
RDW: 25.8 % — ABNORMAL HIGH (ref 11.5–15.5)
WBC: 5.7 10*3/uL (ref 4.0–10.5)
nRBC: 0 % (ref 0.0–0.2)

## 2019-02-07 LAB — POCT I-STAT EG7
Acid-Base Excess: 5 mmol/L — ABNORMAL HIGH (ref 0.0–2.0)
Acid-Base Excess: 6 mmol/L — ABNORMAL HIGH (ref 0.0–2.0)
Acid-Base Excess: 4 mmol/L — ABNORMAL HIGH (ref 0.0–2.0)
Bicarbonate: 30.8 mmol/L — ABNORMAL HIGH (ref 20.0–28.0)
Bicarbonate: 31.9 mmol/L — ABNORMAL HIGH (ref 20.0–28.0)
Bicarbonate: 30 mmol/L — ABNORMAL HIGH (ref 20.0–28.0)
Calcium, Ion: 1.17 mmol/L (ref 1.15–1.40)
Calcium, Ion: 1.21 mmol/L (ref 1.15–1.40)
Calcium, Ion: 1.12 mmol/L — ABNORMAL LOW (ref 1.15–1.40)
HCT: 28 % — ABNORMAL LOW (ref 36.0–46.0)
HCT: 28 % — ABNORMAL LOW (ref 36.0–46.0)
HCT: 26 % — ABNORMAL LOW (ref 36.0–46.0)
Hemoglobin: 9.5 g/dL — ABNORMAL LOW (ref 12.0–15.0)
Hemoglobin: 9.5 g/dL — ABNORMAL LOW (ref 12.0–15.0)
Hemoglobin: 8.8 g/dL — ABNORMAL LOW (ref 12.0–15.0)
O2 Saturation: 69 %
O2 Saturation: 76 %
O2 Saturation: 67 %
Potassium: 3.9 mmol/L (ref 3.5–5.1)
Potassium: 4.1 mmol/L (ref 3.5–5.1)
Potassium: 3.8 mmol/L (ref 3.5–5.1)
Sodium: 139 mmol/L (ref 135–145)
Sodium: 139 mmol/L (ref 135–145)
Sodium: 141 mmol/L (ref 135–145)
TCO2: 32 mmol/L (ref 22–32)
TCO2: 33 mmol/L — ABNORMAL HIGH (ref 22–32)
TCO2: 32 mmol/L (ref 22–32)
pCO2, Ven: 52.6 mmHg (ref 44.0–60.0)
pCO2, Ven: 52.8 mmHg (ref 44.0–60.0)
pCO2, Ven: 51.9 mmHg (ref 44.0–60.0)
pH, Ven: 7.375 (ref 7.250–7.430)
pH, Ven: 7.388 (ref 7.250–7.430)
pH, Ven: 7.371 (ref 7.250–7.430)
pO2, Ven: 37 mmHg (ref 32.0–45.0)
pO2, Ven: 43 mmHg (ref 32.0–45.0)
pO2, Ven: 37 mmHg (ref 32.0–45.0)

## 2019-02-07 LAB — BASIC METABOLIC PANEL
Anion gap: 9 (ref 5–15)
BUN: 41 mg/dL — ABNORMAL HIGH (ref 8–23)
CO2: 26 mmol/L (ref 22–32)
Calcium: 8.9 mg/dL (ref 8.9–10.3)
Chloride: 100 mmol/L (ref 98–111)
Creatinine, Ser: 1.09 mg/dL — ABNORMAL HIGH (ref 0.44–1.00)
GFR calc Af Amer: 57 mL/min — ABNORMAL LOW (ref >60)
GFR calc non Af Amer: 49 mL/min — ABNORMAL LOW (ref >60)
Glucose, Bld: 99 mg/dL (ref 70–99)
Potassium: 4 mmol/L (ref 3.5–5.1)
Sodium: 135 mmol/L (ref 135–145)

## 2019-02-07 LAB — PROTIME-INR
INR: 2 — ABNORMAL HIGH (ref 0.8–1.2)
Prothrombin Time: 22.5 seconds — ABNORMAL HIGH (ref 11.4–15.2)

## 2019-02-07 LAB — NOVEL CORONAVIRUS, NAA (HOSP ORDER, SEND-OUT TO REF LAB; TAT 18-24 HRS): SARS-CoV-2, NAA: NOT DETECTED

## 2019-02-07 MED ORDER — FUROSEMIDE 80 MG PO TABS: 80.0000 mg | ORAL_TABLET | Freq: Two times a day (BID) | ORAL | 0 refills | Status: DC

## 2019-02-07 MED ORDER — SPIRONOLACTONE 25 MG PO TABS: 12.5000 mg | ORAL_TABLET | Freq: Every day | ORAL | 0 refills | Status: DC

## 2019-02-07 MED ORDER — FUROSEMIDE 80 MG PO TABS
80.0000 mg | ORAL_TABLET | Freq: Two times a day (BID) | ORAL | Status: DC
  Administered 2019-02-07: 80 mg via ORAL
  Filled 2019-02-07: qty 1

## 2019-02-07 MED ORDER — SPIRONOLACTONE 12.5 MG HALF TABLET: 12.5000 mg | ORAL_TABLET | Freq: Every day | ORAL | Status: DC

## 2019-02-07 MED ORDER — METOLAZONE 2.5 MG PO TABS: ORAL_TABLET | ORAL | 0 refills | Status: DC

## 2019-02-07 MED ORDER — OXYMETAZOLINE HCL 0.05 % NA SOLN: 1.0000 | Freq: Two times a day (BID) | NASAL | 0 refills | Status: DC | PRN

## 2019-02-07 MED ORDER — COLCHICINE 0.6 MG PO TABS: 0.6000 mg | ORAL_TABLET | Freq: Every day | ORAL | 0 refills | Status: DC

## 2019-02-07 MED ORDER — SPIRONOLACTONE 25 MG PO TABS: 25.0000 mg | ORAL_TABLET | Freq: Every day | ORAL | 0 refills | Status: DC

## 2019-02-07 MED ORDER — WARFARIN SODIUM 2.5 MG PO TABS: 2.5000 mg | ORAL_TABLET | ORAL | Status: DC

## 2019-02-07 MED ORDER — OXYMETAZOLINE HCL 0.05 % NA SOLN
1.0000 | Freq: Two times a day (BID) | NASAL | Status: DC | PRN
  Filled 2019-02-07: qty 30

## 2019-02-07 MED ORDER — POLYVINYL ALCOHOL 1.4 % OP SOLN: 1.0000 [drp] | OPHTHALMIC | 0 refills | Status: AC | PRN

## 2019-02-07 NOTE — Progress Notes (Signed)
Advanced Heart Failure Rounding Note   Subjective:    Feels good. Breathing much improved. Anxious to go home.   RHC yesterday showed elevated R-sided pressures due to severe TR and mild to moderate PAH  INR 2.0  No bleeding   Objective:   Weight Range:  Vital Signs:   Temp:  [97.7 F (36.5 C)-99.3 F (37.4 C)] 98.7 F (37.1 C) (05/19 1110) Pulse Rate:  [0-162] 72 (05/19 1110) Resp:  [0-103] 18 (05/19 0629) BP: (102-132)/(48-93) 126/58 (05/19 1110) SpO2:  [0 %-100 %] 94 % (05/19 1110) Weight:  [60 kg] 60 kg (05/19 0629) Last BM Date: 02/05/19  Weight change: Filed Weights   02/05/19 0303 02/06/19 0443 02/07/19 0629  Weight: 62.1 kg 60.4 kg 60 kg    Intake/Output:   Intake/Output Summary (Last 24 hours) at 02/07/2019 1128 Last data filed at 02/07/2019 0940 Gross per 24 hour  Intake 633.12 ml  Output 1400 ml  Net -766.88 ml     Physical Exam: General:  Lying in bed. No resp difficulty HEENT: normal Neck: supple. JVP to jaw with prominent v waves Carotids 2+ bilat; no bruits. No lymphadenopathy or thryomegaly appreciated. Cor: PMI nondisplaced. Irregular rate & rhythm. 2/6 TR mechanical s1 Lungs: clear Abdomen: soft, nontender, nondistended. No hepatosplenomegaly. No bruits or masses. Good bowel sounds. Extremities: no cyanosis, clubbing, rash, edema Neuro: alert & orientedx3, cranial nerves grossly intact. moves all 4 extremities w/o difficulty. Affect pleasant  Telemetry: AF 70-80s Personally reviewed   Labs: Basic Metabolic Panel: Recent Labs  Lab 02/03/19 0111 02/04/19 0700 02/05/19 0610 02/06/19 0606 02/06/19 1244 02/06/19 1252 02/07/19 0527  NA 137 136 137 135 139 139 135  K 3.7 3.1* 4.4 3.9 4.1 3.9 4.0  CL 104 97* 98 99  --   --  100  CO2 24 27 28 27   --   --  26  GLUCOSE 125* 93 96 97  --   --  99  BUN 37* 35* 38* 41*  --   --  41*  CREATININE 1.47* 0.98 1.32* 1.55*  --   --  1.09*  CALCIUM 9.8 8.9 9.1 8.8*  --   --  8.9  MG 2.4  --    --   --   --   --   --   PHOS 3.7  --   --   --   --   --   --     Liver Function Tests: No results for input(s): AST, ALT, ALKPHOS, BILITOT, PROT, ALBUMIN in the last 168 hours. No results for input(s): LIPASE, AMYLASE in the last 168 hours. No results for input(s): AMMONIA in the last 168 hours.  CBC: Recent Labs  Lab 02/02/19 1619 02/03/19 0111 02/05/19 0610 02/06/19 0606 02/06/19 1244 02/06/19 1252 02/07/19 0527  WBC 3.9* 4.1 5.7 5.1  --   --  5.7  NEUTROABS 2.3  --   --   --   --   --   --   HGB 9.2* 9.0* 8.9* 9.0* 9.5* 9.5* 8.8*  HCT 28.4* 26.8* 26.4* 27.0* 28.0* 28.0* 26.6*  MCV 88.5 86.7 85.2 85.4  --   --  85.5  PLT 153 142* 145* 145*  --   --  146*    Cardiac Enzymes: Recent Labs  Lab 02/02/19 1628  TROPONINI 0.05*    BNP: BNP (last 3 results) Recent Labs    03/30/18 2051 02/02/19 1619  BNP 1,780.8* 2,329.2*    ProBNP (last  3 results) No results for input(s): PROBNP in the last 8760 hours.    Other results:  Imaging: No results found.   Medications:     Scheduled Medications: . atorvastatin  40 mg Oral q1800  . docusate sodium  200 mg Oral BID  . metoprolol tartrate  25 mg Oral BID  . sodium chloride flush  3 mL Intravenous Q12H  . spironolactone  25 mg Oral Daily  . [START ON 02/09/2019] warfarin  2.5 mg Oral Once per day on Mon Thu  . warfarin  5 mg Oral Once per day on Sun Tue Wed Fri Sat  . Warfarin - Pharmacist Dosing Inpatient   Does not apply q1800    Infusions: . sodium chloride      PRN Medications: sodium chloride, acetaminophen, ipratropium-albuterol, ondansetron (ZOFRAN) IV, oxymetazoline, polyethylene glycol, polyvinyl alcohol, sodium chloride flush   Assessment/Plan:   1. 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 Well functioning MVR. RV is dilated with moderate HK and evidence of RV pressure volume overload.Marland Kitchen RVSP estimated at 58mm HG but suspect it is higher.  -  RHC as above with mild to moderate PAH and severe TR. Unfortunately not candidate for TV clip currently.  - Can go home today. Would increase lasix from 80/40 to 80 bid and add spiro 12.5 daily. Will arrange close f/u. Can consider switching to torsemide as needed. Metoalzone 2.5 prn for weight 136 or greater  2. 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 Pulmonary Hypertension - As above. See [place] data from yesterday  4. 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  - MVR stable on echo  5. Tricuspid Regurgitation - Severe tricuspid regurgitation on echo  - Felt to be related to [facility] + tethering of TV due to pacemaker leads. Patient is not a good candidate for repeat sternotomy and valve repair. Unfortunately not candidate for TV clip due to presence of PM wire   6. Permanent Atrial Fibrillation s/p PPM - Rate controlled. INR 2.0 - Continue chronic anticoagulation with Coumadin. Per Dr. Victorino December last office visit note, patient has had serious bleeding complications in the past (intracranial hemorrhage and intrahepatic bleeding). Therefore, trying to keep INR at 2.0 to 2.5 despite mMVR. Discussed dosing with PharmD personally.   9. CAD with Elevated Troponin - Patient has history of CAD with NSTEMI in 06/2017 s/p DES to RCA. - Patient denies any recent angina. - Initial troponin minimally elevated at 0.05 in the ED which is similar to where it was in 03/2018 when she was admitted for CHF. Suspect demand ischemia due to acute CHF. - Continue medical management.  10. Hypokalemia/hypomagnesemia - improved K 4.0 - spiro added  Ok for d/c from my standpoint. HF meds as above.   Length of Stay: 4   Glori Bickers MD 02/07/2019, 11:28 AM  Advanced Heart Failure Team Pager  [phone] (M-F; 7a - 4p)  Please contact [facility] for night-coverage after hours (4p -7a ) and weekends on [email or web address]

## 2019-02-07 NOTE — Progress Notes (Signed)
Physical Therapy Treatment Patient Details Name: Carrie [name] MRN: [id number] DOB: 10/22/1940 Today's [name]: 02/07/2019    History of Present Illness Patient is a 78 y/o female presenting to the ED on 02/02/2019 with primary complaints of SOB. 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 Admitted for Acute on chronic diastolic congestive heart failure/right-sided heart failure.    PT Comments    Pt very pleasant with left nare bleeding throughout session. Pt able to maintain 93-95% on RA throughout session. Pt with improved gait tolerance able to walk in hall and perform 2 stairs. Pt unable to complete flight of stairs she would need to get to her bedroom and reports she will sleep on couch downstairs. Pt educated for need for increased mobility and walking program with assist of spouse and pt would benefit from [place]. Will continue to follow.    Follow Up Recommendations  Home health PT;Supervision/Assistance - 24 hour     Equipment Recommendations  Rolling walker with 5" wheels    Recommendations for Other Services       Precautions / Restrictions Precautions Precautions: Fall Precaution Comments: bil LE edema    Mobility  Bed Mobility Overal bed mobility: Needs Assistance Bed Mobility: Supine to Sit     Supine to sit: Min assist     General bed mobility comments: pt reliant on rail with HOB 30 degrees, physical assist to bring legs to eOB and fully scoot pelvis to EOB  Transfers Overall transfer level: Needs assistance   Transfers: Sit to/from Stand Sit to Stand: Min assist         General transfer comment: min assist to rise from bed and toilet with cues for hand placement  Ambulation/Gait Ambulation/Gait assistance: Min assist Gait  Distance (Feet): 250 Feet Assistive device: Rolling walker (2 wheeled) Gait Pattern/deviations: Step-through pattern;Decreased stride length;Trunk flexed   Gait velocity interpretation: <1.8 ft/sec, indicate of risk for recurrent falls General Gait Details: pt with min assist to control RW as pt drifts left continually. PT with cues for posture, position in RW, safety and direction   Stairs Stairs: Yes Stairs assistance: Min guard Stair Management: One rail Right;Forwards;Step to pattern Number of Stairs: 2 General stair comments: pt able to ascend 2 steps with reliance on rail. Fatigue limited ability and unable to complete full flight mimicking transition to bedroom at home   Wheelchair Mobility    Modified Rankin (Stroke Patients Only)       Balance Overall balance assessment: Needs assistance Sitting-balance support: Feet supported Sitting balance-Leahy Scale: Good     Standing balance support: Bilateral upper extremity supported;During functional activity Standing balance-Leahy Scale: Poor                              Cognition Arousal/Alertness: Awake/alert Behavior During Therapy: WFL for tasks assessed/performed Overall Cognitive Status: Within Functional Limits for tasks assessed                                        Exercises      General Comments        Pertinent Vitals/Pain Pain Assessment: No/denies pain    Home Living  Prior Function            PT Goals (current goals can now be found in the care plan section) Progress towards PT goals: Progressing toward goals    Frequency           PT Plan Discharge plan needs to be updated    Co-evaluation              AM-PAC PT "6 Clicks" Mobility   Outcome Measure  Help needed turning from your back to your side while in a flat bed without using bedrails?: A Little Help needed moving from lying on your back to sitting on the  side of a flat bed without using bedrails?: A Little Help needed moving to and from a bed to a chair (including a wheelchair)?: A Little Help needed standing up from a chair using your arms (e.g., wheelchair or bedside chair)?: A Little Help needed to walk in hospital room?: A Little Help needed climbing 3-5 steps with a railing? : A Lot 6 Click Score: 17    End of Session Equipment Utilized During Treatment: Gait belt Activity Tolerance: Patient tolerated treatment well Patient left: in chair;with call bell/phone within reach Nurse Communication: Mobility status PT Visit Diagnosis: Unsteadiness on feet (R26.81);Other abnormalities of gait and mobility (R26.89);Muscle weakness (generalized) (M62.81)     Time: 8867-7373 PT Time Calculation (min) (ACUTE ONLY): 44 min  Charges:  $Gait Training: 23-37 mins $Therapeutic Activity: 8-22 mins                     [place], PT Acute Rehabilitation Services Pager: [phone] Office: [place] 02/07/2019, 11:11 AM

## 2019-02-07 NOTE — Progress Notes (Addendum)
ANTICOAGULATION CONSULT NOTE - Follow Up Consult  Pharmacy Consult for Heparin Indication: Mech MVR and h/o afib  Allergies  Allergen Reactions  . Codeine Nausea And Vomiting    Patient Measurements: Height: 5\' 1"  (154.9 cm) Weight: 132 lb 3.2 oz (60 kg)(scale b) IBW/kg (Calculated) : 47.8  Vital Signs: Temp: 97.7 F (36.5 C) (05/19 0629) Temp Source: Oral (05/19 0629) BP: 106/55 (05/19 0629) Pulse Rate: 59 (05/19 0629)  Labs: Recent Labs    02/05/19 0610 02/06/19 0606 02/07/19 0527  HGB 8.9* 9.0* 8.8*  HCT 26.4* 27.0* 26.6*  PLT 145* 145* 146*  LABPROT 23.6* 25.0* 22.5*  INR 2.1* 2.3* 2.0*  CREATININE 1.32* 1.55* 1.09*    Estimated Creatinine Clearance: 36 mL/min (A) (by C-G formula based on SCr of 1.09 mg/dL (H)).   Assessment:  Anticoag: 78 yo female with a PMH including Mechanical MVR and afib on Coumadin PTA (goal 2-2.5). Coumadin dose PTA 5mg  daily except 2.5mg  Mon/Thurs. INR on admission 2.2   INR therapeutic at 2 today. Pt did not receive dose last night. Hgb stable at 8.8. No s/s of bleeding reported.  Goal of Therapy:  IRN 2-2.5 Monitor platelets by anticoagulation protocol: Yes   Plan:  Continue home Coumadin regimen: 5mg  daily except 2.5mg  Mon/Thurs - will monitor for any increased doses needed for missed dose  Monitor daily INR, CBCs and s/s of bleeding   Juanell Fairly, PharmD PGY1 Pharmacy Resident 02/07/2019   8:30 AM

## 2019-02-07 NOTE — TOC Progression Note (Signed)
Transition of Care [facility]) - Progression Note    Patient Details  Name: Carrie [name] MRN: [id number] Date of Birth: 1940/12/14  Transition of Care [facility]) CM/SW [place], LCSW Phone Number: 02/07/2019, 8:24 AM  Clinical Narrative: Per weekend CSW handoff, "patient sister in law stated she would like patient to go [facility] with [facility]) . Spoke with Corene Cornea from [facility] and he stated they should be able to take her back."  Expected Discharge Plan: [place] Barriers to Discharge: Ship broker, Continued Medical Work up  Expected Discharge Plan and Services Expected Discharge Plan: [place] In-house Referral: Clinical Social Work     Living arrangements for the past 2 months: Single Family Home                                       Social Determinants of Health (SDOH) Interventions    Readmission Risk Interventions No flowsheet data found.

## 2019-02-07 NOTE — TOC Transition Note (Signed)
Transition of Care [facility]) - CM/SW Discharge Note   Patient Details  Name: Carrie [name] MRN: [id number] Date of Birth: 17-May-1941  [facility]) CM/SW Contact:  Royston Bake, RN Phone Number: 02/07/2019, 1:50 PM   Clinical Narrative:    Patient for discharge home today; [place] provided by [facility] rolling walker ordered and will be delivered to the patient's room today prior to dc home.   Final next level of care: Skilled Nursing Facility Barriers to Discharge: Ship broker, Continued Medical Work up   Patient Goals and CMS Choice Patient states their goals for this hospitalization and ongoing recovery are:: to bak home to husband  CMS Medicare.gov Compare Post Acute Care list provided to:: Patient Choice offered to / list presented to : Patient  Discharge Placement                       Discharge Plan and Services In-house Referral: Clinical Social Work                                   Social Determinants of Health ([place]) Interventions     Readmission Risk Interventions No flowsheet data found.

## 2019-02-07 NOTE — Discharge Summary (Addendum)
Discharge Summary  Carrie [name]:[id number] DOB: 1941-08-11  PCP: Lucianne Lei, MD  Admit date: 02/02/2019 Discharge date: 02/07/2019  Time spent: 35 minutes  Recommendations for Outpatient Follow-up:  1. Follow-up with your cardiologist 2. Follow-up with your PCP 3. [place] 4. Take your medications as prescribed 5. Continue physical therapy 6. Fall precautions  Discharge Diagnoses:  Active Hospital Problems   Diagnosis Date Noted   CHF exacerbation ([place]) 02/03/2019   CHF (congestive heart failure) ([facility] [facility]) 02/02/2019   SOB (shortness of breath) 02/02/2019    Resolved Hospital Problems  No resolved problems to display.    Discharge Condition: Stable  Diet recommendation: Heart healthy low-sodium diet  Vitals:   02/07/19 1048 02/07/19 1110  BP:  (!) 126/58  Pulse: 65 72  Resp:    Temp:  98.7 F (37.1 C)  SpO2: 95% 94%    History of present illness:   Patient is a 78-year African-American female with multiple cardiac problems. 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 Patient presents with 2 week history of progressivelyworsening shortness of breath, leg edema, orthopnea and dyspnea on exertion. No headache, no neck pain, no chest pain, no fever or chills, no GI symptoms and no urinary symptoms. On presentation to the hospital, cardiac BNP was greater than 2000 and a chest x-ray revealed severe cardiomegaly with mild pulmonary congestion. Hospitalist team asked to admit patient for further assessment and management.  Heart failure team consulted and followed.  RHC done on 02/06/2019 showing mild to moderate PAH but main issue is severe TR.  Per cardiology she is  not a candidate for surgical TVR.  Recommendations to continue medical therapy.   02/07/19: Patient seen and examined at her bedside.  No acute events overnight.  States she feels better today.  She denies any chest pain, palpitations or dyspnea at rest.  Had left great toe tenderness on palpation yesterday which is now improved with colchicine.  Possible gout, will follow-up outpatient with her PCP.  On the day of discharge, the patient was hemodynamically stable.  Labs and vital signs reviewed and are stable.  She will need to follow-up with cardiology and her primary care provider posthospitalization.  She will also need to continue physical therapy and apply fall precautions.    Hospital Course:  Active Problems:   CHF (congestive heart failure) (HCC)   SOB (shortness of breath)   CHF exacerbation ([facility])  Acute on chronic diastolic CHF complicated by right sided heart failure Presented with BNP greater than 2300 from her baseline greater than 1700 Independently reviewed chest x-ray on admission which revealed significant cardiomegaly, increase pulmonary vascularity and right pleural effusion. Last 2D echo done on 03/31/2018 revealed LVEF 60 to 65% 2D echo done on 02/03/2019 showed normal LV function, well-functioning MVR with evidence of RV pressure volume overload.   Heart failure team followed Diuresed well net -8.4 L since admission with resolution of her symptoms. Unna boots in place with improvement of her bilateral lower extremity edema. Continue cardiac medications Follow-up with cardiology outpatient  Discussed with Cardiology on 02/07/19. Recommendations as followed: Discharge with HHS and follow up with cardiology. Take following medications: -Lasix po 80 mg BID -Spironolactone 12.5 mg daily -Metolazone 2.5 mg prn with weight >137 lbs. Current weight is  132 lbs.  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 Post RHC done on 02/06/2019 Per cardiology continue medical therapy Not a  candidate for surgical TAVR  Resolving AKI Baseline creatinine appears to be 0.9 with GFR greater than 60 Creatinine on 02/07/2019 was improved 1.09 from 1.55 on 02/06/2019 Good urine output Follow-up with your primary care provider outpatient  Resolved acute hypoxic respiratory failure suspect secondary to pulmonary edema from acute on chronic diastolic CHF and right-sided heart failure O2 saturation 94% on room air Maintain O2 saturation greater than 90%  Mild epistaxis, likely from dry nares Afrin BID prn INR therapeutic 2.0 Denies pain, states has a prior hx of epistaxis.  Left first toe pain, possibly acute gout flare improving with colchicine Possibly gout but no known prior history No significant swelling from physical exam Continue colchicine 0.6 mg daily x 4 days  Resolved hypokalemia post repletion  Right pleural effusion likely cardiac etiology Continue diuretics O2 saturation okay 94% on room air  Resolved non-exertional dyspnea likely secondary to acute on chronic diastolic CHF combined with severe pulmonary hypertension Management as stated above  Chronic atrial fibrillation Rate controlled Anticoagulated on Coumadin with therapeutic INR 2.0 Continue Lopressor 25 mg twice daily Continue to closely monitor on telemetry  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  History of intracranial hemorrhage and intrahepatic bleeding No acute issues INR goal between 2 and 2.5.  Sick sinus syndrome status post AICD placement No acute issues  Coronary artery disease Continue Lipitor  Physical debility/ambulatory dysfunction Uses a cane at baseline PT OT recommended SNF; patient prefers to go to home with home health services Case manager consulted to assist with home health services     Code  Status:Full  Consults called: Cardiology     Procedures:  [place]   Discharge Exam: BP (!) 126/58 (BP Location: Left Arm)    Pulse 72    Temp 98.7 F (37.1 C) (Oral)    Resp 18    Ht 5\' 1"  (1.549 m)    Wt 60 kg Comment: scale b   SpO2 94%    BMI 24.98 kg/m   General: 78 y.o. year-old female well developed well nourished in no acute distress.  Alert and oriented x3.  Cardiovascular: Regular rate and rhythm with no rubs or gallops.  No thyromegaly or JVD noted.    Respiratory: Clear to auscultation with no wheezes or rales. Good inspiratory effort.  Abdomen: Soft nontender nondistended with normal bowel sounds x4 quadrants.  Musculoskeletal: Unna boots in place in lower extremities bilaterally.    Psychiatry: Mood is appropriate for condition and setting  Discharge Instructions You were cared for by a hospitalist during your hospital stay. If you have any questions about your discharge medications or the care you received while you were in the hospital after you are discharged, you can call the unit and asked to speak with the hospitalist on call if the hospitalist that took care of you is not available. Once you are discharged, your primary care physician will handle any further medical issues. Please note that NO REFILLS for any discharge medications will be authorized once you are discharged, as it is imperative that you return to your primary care physician (or establish a relationship with a primary care physician if you do not have one) for your aftercare needs so that they can reassess your need for medications  and monitor your lab values.   Allergies as of 02/07/2019      Reactions   Codeine Nausea And Vomiting      Medication List    STOP taking these medications   diltiazem 120 MG 24 hr capsule Commonly known as:  CARDIZEM CD     TAKE these medications   atorvastatin 40 MG tablet Commonly known as:  LIPITOR TAKE 1 TABLET BY MOUTH EVERY DAY AT 6PM What changed:   See the new instructions.   Biotin 1000 MCG tablet Take 1,000 mcg by mouth daily.   colchicine 0.6 MG tablet Take 1 tablet (0.6 mg total) by mouth daily for 4 days.   dextromethorphan-guaiFENesin 30-600 MG 12hr tablet Commonly known as:  MUCINEX DM Take 1 tablet by mouth 2 (two) times daily as needed for cough.   docusate sodium 100 MG capsule Commonly known as:  COLACE Take 200 mg by mouth 2 (two) times daily.   Fish Oil 1000 MG Caps Take 1,000 mg by mouth daily.   furosemide 80 MG tablet Commonly known as:  LASIX Take 1 tablet (80 mg total) by mouth 2 (two) times daily. What changed:    medication strength  how much to take   metolazone 2.5 MG tablet Commonly known as:  ZAROXOLYN Take 2.5 mg when your weight is above 137 lbs as recommended by cardiology. What changed:  additional instructions   metoprolol tartrate 25 MG tablet Commonly known as:  LOPRESSOR Take 1 tablet (25 mg total) by mouth 2 (two) times daily.   MULTIVITAMINS PO Take 1 tablet by mouth daily.   nitroGLYCERIN 0.4 MG SL tablet Commonly known as:  NITROSTAT Place 1 tablet (0.4 mg total) under the tongue every 5 (five) minutes as needed for chest pain.   oxymetazoline 0.05 % nasal spray Commonly known as:  AFRIN Place 1 spray into both nostrils 2 (two) times daily as needed for congestion.   polyethylene glycol 17 g packet Commonly known as:  MIRALAX / GLYCOLAX Take 17 g by mouth as needed (for constipation). Reported on 03/25/2016   polyvinyl alcohol 1.4 % ophthalmic solution Commonly known as:  LIQUIFILM TEARS Place 1 drop into the left eye as needed for dry eyes.   potassium chloride SA 20 MEQ tablet Commonly known as:  K-DUR Take 2 tablets (40 mEq total) by mouth every morning AND 1 tablet (20 mEq total) every evening.   spironolactone 25 MG tablet Commonly known as:  ALDACTONE Take 0.5 tablets (12.5 mg total) by mouth daily. Start taking on:  Feb 08, 2019   warfarin 5 MG  tablet Commonly known as:  COUMADIN Take as directed. If you are unsure how to take this medication, talk to your nurse or doctor. Original instructions:  TAKE 1 TO 1 AND 1/2 TABLETS BY MOUTH DAILY AS DIRECTED What changed:    how much to take  how to take this  when to take this  additional instructions            Durable Medical Equipment  (From admission, onward)         Start     Ordered   02/04/19 0640  For home use only DME Walker rolling  Once    Comments:  Rolling walker With 5" wheels  Question:  Patient needs a walker to treat with the following condition  Answer:  Ambulatory dysfunction   02/04/19 0639         Allergies  Allergen Reactions  Codeine Nausea And Vomiting   Follow-up Information    Lucianne Lei, MD. Call in 1 day(s).   Specialty:  Family Medicine Why:  Please call for post hospital follow-up appointment. Contact information: [address]  [place] [address] [phone]        [name], MD .   Specialty:  Cardiology Contact information: [address] [place] [address] [place] [address] [phone]        [facility] Follow up.   Specialty:  Cardiology Why:  Follow up in heart failure clinic.  Contact information: [address] [id number] [place] [phone]           The results of significant diagnostics from this hospitalization (including imaging, microbiology, ancillary and laboratory) are listed below for reference.    Significant Diagnostic Studies: Dg Chest 2 View  Result Date: 02/02/2019 CLINICAL DATA:  Shortness of breath EXAM: CHEST - 2 VIEW COMPARISON:  03/30/2018 CT chest.  Chest x-ray dated 07/10 2019. FINDINGS: Again identified is massive cardiomegaly. The patient is status post prior median sternotomy. There is a persistent airspace opacity at the right lung base favored to represent a combination of  atelectasis and a small right-sided pleural effusion. There is scarring at the right lung base. The left lung field is mostly clear. There is no pneumothorax. There is vascular congestion without overt pulmonary edema. IMPRESSION: 1. Persistent severe cardiomegaly. 2. Opacity at the right lung base favored to represent a combination of a small right-sided pleural effusion and atelectasis. An underlying infiltrate is difficult to exclude but overall this appearance is minimally changed from chest x-ray dated 03/30/2018. 3. No pneumothorax.  Mild vascular congestion. Electronically Signed   By: Constance Holster M.D.   On: 02/02/2019 15:30    Microbiology: Recent Results (from the past 240 hour(s))  SARS Coronavirus 2 (CEPHEID - Performed in [facility] hospital lab), Hosp Order     Status: None   Collection Time: 02/02/19  6:17 PM  Result Value Ref Range Status   SARS Coronavirus 2 NEGATIVE NEGATIVE Final    Comment: (NOTE) If result is NEGATIVE SARS-CoV-2 target nucleic acids are NOT DETECTED. The SARS-CoV-2 RNA is generally detectable in upper and lower  respiratory specimens during the acute phase of infection. The lowest  concentration of SARS-CoV-2 viral copies this assay can detect is 250  copies / mL. A negative result does not preclude SARS-CoV-2 infection  and should not be used as the sole basis for treatment or other  patient management decisions.  A negative result may occur with  improper specimen collection / handling, submission of specimen other  than nasopharyngeal swab, presence of viral mutation(s) within the  areas targeted by this assay, and inadequate number of viral copies  (<250 copies / mL). A negative result must be combined with clinical  observations, patient history, and epidemiological information. If result is POSITIVE SARS-CoV-2 target nucleic acids are DETECTED. The SARS-CoV-2 RNA is generally detectable in upper and lower  respiratory specimens dur ing the  acute phase of infection.  Positive  results are indicative of active infection with SARS-CoV-2.  Clinical  correlation with patient history and other diagnostic information is  necessary to determine patient infection status.  Positive results do  not rule out bacterial infection or co-infection with other viruses. If result is PRESUMPTIVE POSTIVE SARS-CoV-2 nucleic acids MAY BE PRESENT.   A presumptive positive result was obtained on the submitted  specimen  and confirmed on repeat testing.  While 2019 novel coronavirus  (SARS-CoV-2) nucleic acids may be present in the submitted sample  additional confirmatory testing may be necessary for epidemiological  and / or clinical management purposes  to differentiate between  SARS-CoV-2 and other Sarbecovirus currently known to infect humans.  If clinically indicated additional testing with an alternate test  methodology [phone]) is advised. The SARS-CoV-2 RNA is generally  detectable in upper and lower respiratory sp ecimens during the acute  phase of infection. The expected result is Negative. Fact Sheet for Patients:  StrictlyIdeas.no Fact Sheet for Healthcare Providers: BankingDealers.co.za This test is not yet approved or cleared by the [place] FDA and has been authorized for detection and/or diagnosis of SARS-CoV-2 by FDA under an Emergency Use Authorization (EUA).  This EUA will remain in effect (meaning this test can be used) for the duration of the COVID-19 declaration under Section 564(b)(1) of the Act, 21 U.S.C. section 360bbb-3(b)(1), unless the authorization is terminated or revoked sooner. Performed at [facility], [place] [address], [place], [place] [address]      Labs: Basic Metabolic Panel: Recent Labs  Lab 02/03/19 0111 02/04/19 0700 02/05/19 0610 02/06/19 0606 02/06/19 1244 02/06/19 1252 02/07/19 0527  NA 137 136 137 135 139 139 135  K 3.7 3.1* 4.4 3.9  4.1 3.9 4.0  CL 104 97* 98 99  --   --  100  CO2 24 27 28 27   --   --  26  GLUCOSE 125* 93 96 97  --   --  99  BUN 37* 35* 38* 41*  --   --  41*  CREATININE 1.47* 0.98 1.32* 1.55*  --   --  1.09*  CALCIUM 9.8 8.9 9.1 8.8*  --   --  8.9  MG 2.4  --   --   --   --   --   --   PHOS 3.7  --   --   --   --   --   --    Liver Function Tests: No results for input(s): AST, ALT, ALKPHOS, BILITOT, PROT, ALBUMIN in the last 168 hours. No results for input(s): LIPASE, AMYLASE in the last 168 hours. No results for input(s): AMMONIA in the last 168 hours. CBC: Recent Labs  Lab 02/02/19 1619 02/03/19 0111 02/05/19 0610 02/06/19 0606 02/06/19 1244 02/06/19 1252 02/07/19 0527  WBC 3.9* 4.1 5.7 5.1  --   --  5.7  NEUTROABS 2.3  --   --   --   --   --   --   HGB 9.2* 9.0* 8.9* 9.0* 9.5* 9.5* 8.8*  HCT 28.4* 26.8* 26.4* 27.0* 28.0* 28.0* 26.6*  MCV 88.5 86.7 85.2 85.4  --   --  85.5  PLT 153 142* 145* 145*  --   --  146*   Cardiac Enzymes: Recent Labs  Lab 02/02/19 1628  TROPONINI 0.05*   BNP: BNP (last 3 results) Recent Labs    03/30/18 2051 02/02/19 1619  BNP 1,780.8* 2,329.2*    ProBNP (last 3 results) No results for input(s): PROBNP in the last 8760 hours.  CBG: No results for input(s): GLUCAP in the last 168 hours.     Signed:  Kayleen Memos, MD [facility] 02/07/2019, 11:44 AM

## 2019-02-09 DIAGNOSIS — I50810 Right heart failure, unspecified: Secondary | ICD-10-CM | POA: Diagnosis not present

## 2019-02-09 DIAGNOSIS — N183 Chronic kidney disease, stage 3 (moderate): Secondary | ICD-10-CM | POA: Diagnosis not present

## 2019-02-09 DIAGNOSIS — I5033 Acute on chronic diastolic (congestive) heart failure: Secondary | ICD-10-CM | POA: Diagnosis not present

## 2019-02-09 DIAGNOSIS — Z8673 Personal history of transient ischemic attack (TIA), and cerebral infarction without residual deficits: Secondary | ICD-10-CM | POA: Diagnosis not present

## 2019-02-09 DIAGNOSIS — I272 Pulmonary hypertension, unspecified: Secondary | ICD-10-CM | POA: Diagnosis not present

## 2019-02-09 DIAGNOSIS — I130 Hypertensive heart and chronic kidney disease with heart failure and stage 1 through stage 4 chronic kidney disease, or unspecified chronic kidney disease: Secondary | ICD-10-CM | POA: Diagnosis not present

## 2019-02-09 DIAGNOSIS — Z950 Presence of cardiac pacemaker: Secondary | ICD-10-CM | POA: Diagnosis not present

## 2019-02-09 DIAGNOSIS — I252 Old myocardial infarction: Secondary | ICD-10-CM | POA: Diagnosis not present

## 2019-02-09 DIAGNOSIS — Z955 Presence of coronary angioplasty implant and graft: Secondary | ICD-10-CM | POA: Diagnosis not present

## 2019-02-09 DIAGNOSIS — I4821 Permanent atrial fibrillation: Secondary | ICD-10-CM | POA: Diagnosis not present

## 2019-02-09 DIAGNOSIS — D631 Anemia in chronic kidney disease: Secondary | ICD-10-CM | POA: Diagnosis not present

## 2019-02-09 DIAGNOSIS — I2510 Atherosclerotic heart disease of native coronary artery without angina pectoris: Secondary | ICD-10-CM | POA: Diagnosis not present

## 2019-02-09 DIAGNOSIS — I5022 Chronic systolic (congestive) heart failure: Secondary | ICD-10-CM | POA: Diagnosis not present

## 2019-02-09 DIAGNOSIS — Z7901 Long term (current) use of anticoagulants: Secondary | ICD-10-CM | POA: Diagnosis not present

## 2019-02-09 DIAGNOSIS — K219 Gastro-esophageal reflux disease without esophagitis: Secondary | ICD-10-CM | POA: Diagnosis not present

## 2019-02-09 DIAGNOSIS — Z952 Presence of prosthetic heart valve: Secondary | ICD-10-CM | POA: Diagnosis not present

## 2019-02-09 DIAGNOSIS — E7800 Pure hypercholesterolemia, unspecified: Secondary | ICD-10-CM | POA: Diagnosis not present

## 2019-02-09 DIAGNOSIS — M1990 Unspecified osteoarthritis, unspecified site: Secondary | ICD-10-CM | POA: Diagnosis not present

## 2019-02-09 DIAGNOSIS — M109 Gout, unspecified: Secondary | ICD-10-CM | POA: Diagnosis not present

## 2019-02-09 DIAGNOSIS — I4892 Unspecified atrial flutter: Secondary | ICD-10-CM | POA: Diagnosis not present

## 2019-02-09 DIAGNOSIS — I082 Rheumatic disorders of both aortic and tricuspid valves: Secondary | ICD-10-CM | POA: Diagnosis not present

## 2019-02-09 DIAGNOSIS — I495 Sick sinus syndrome: Secondary | ICD-10-CM | POA: Diagnosis not present

## 2019-02-10 ENCOUNTER — Telehealth: Payer: Self-pay

## 2019-02-10 DIAGNOSIS — M109 Gout, unspecified: Secondary | ICD-10-CM | POA: Diagnosis not present

## 2019-02-10 DIAGNOSIS — I272 Pulmonary hypertension, unspecified: Secondary | ICD-10-CM | POA: Diagnosis not present

## 2019-02-10 DIAGNOSIS — Z7901 Long term (current) use of anticoagulants: Secondary | ICD-10-CM | POA: Diagnosis not present

## 2019-02-10 DIAGNOSIS — E7800 Pure hypercholesterolemia, unspecified: Secondary | ICD-10-CM | POA: Diagnosis not present

## 2019-02-10 DIAGNOSIS — I130 Hypertensive heart and chronic kidney disease with heart failure and stage 1 through stage 4 chronic kidney disease, or unspecified chronic kidney disease: Secondary | ICD-10-CM | POA: Diagnosis not present

## 2019-02-10 DIAGNOSIS — D631 Anemia in chronic kidney disease: Secondary | ICD-10-CM | POA: Diagnosis not present

## 2019-02-10 DIAGNOSIS — I2510 Atherosclerotic heart disease of native coronary artery without angina pectoris: Secondary | ICD-10-CM | POA: Diagnosis not present

## 2019-02-10 DIAGNOSIS — Z950 Presence of cardiac pacemaker: Secondary | ICD-10-CM | POA: Diagnosis not present

## 2019-02-10 DIAGNOSIS — I5033 Acute on chronic diastolic (congestive) heart failure: Secondary | ICD-10-CM | POA: Diagnosis not present

## 2019-02-10 DIAGNOSIS — M1990 Unspecified osteoarthritis, unspecified site: Secondary | ICD-10-CM | POA: Diagnosis not present

## 2019-02-10 DIAGNOSIS — Z952 Presence of prosthetic heart valve: Secondary | ICD-10-CM | POA: Diagnosis not present

## 2019-02-10 DIAGNOSIS — I4821 Permanent atrial fibrillation: Secondary | ICD-10-CM | POA: Diagnosis not present

## 2019-02-10 DIAGNOSIS — Z955 Presence of coronary angioplasty implant and graft: Secondary | ICD-10-CM | POA: Diagnosis not present

## 2019-02-10 DIAGNOSIS — I495 Sick sinus syndrome: Secondary | ICD-10-CM | POA: Diagnosis not present

## 2019-02-10 DIAGNOSIS — I4892 Unspecified atrial flutter: Secondary | ICD-10-CM | POA: Diagnosis not present

## 2019-02-10 DIAGNOSIS — N183 Chronic kidney disease, stage 3 (moderate): Secondary | ICD-10-CM | POA: Diagnosis not present

## 2019-02-10 DIAGNOSIS — I082 Rheumatic disorders of both aortic and tricuspid valves: Secondary | ICD-10-CM | POA: Diagnosis not present

## 2019-02-10 DIAGNOSIS — K219 Gastro-esophageal reflux disease without esophagitis: Secondary | ICD-10-CM | POA: Diagnosis not present

## 2019-02-10 DIAGNOSIS — Z8673 Personal history of transient ischemic attack (TIA), and cerebral infarction without residual deficits: Secondary | ICD-10-CM | POA: Diagnosis not present

## 2019-02-10 DIAGNOSIS — I5022 Chronic systolic (congestive) heart failure: Secondary | ICD-10-CM | POA: Diagnosis not present

## 2019-02-10 DIAGNOSIS — I252 Old myocardial infarction: Secondary | ICD-10-CM | POA: Diagnosis not present

## 2019-02-10 DIAGNOSIS — I50810 Right heart failure, unspecified: Secondary | ICD-10-CM | POA: Diagnosis not present

## 2019-02-10 NOTE — Telephone Encounter (Signed)

## 2019-02-14 ENCOUNTER — Ambulatory Visit (INDEPENDENT_AMBULATORY_CARE_PROVIDER_SITE_OTHER): Payer: Medicare Other | Admitting: *Deleted

## 2019-02-14 DIAGNOSIS — Z7901 Long term (current) use of anticoagulants: Secondary | ICD-10-CM | POA: Diagnosis not present

## 2019-02-14 DIAGNOSIS — Z952 Presence of prosthetic heart valve: Secondary | ICD-10-CM

## 2019-02-14 DIAGNOSIS — I480 Paroxysmal atrial fibrillation: Secondary | ICD-10-CM | POA: Diagnosis not present

## 2019-02-14 DIAGNOSIS — I482 Chronic atrial fibrillation, unspecified: Secondary | ICD-10-CM | POA: Diagnosis not present

## 2019-02-14 DIAGNOSIS — I110 Hypertensive heart disease with heart failure: Secondary | ICD-10-CM | POA: Diagnosis not present

## 2019-02-14 LAB — POCT INR: INR: 1.6 — AB (ref 2.0–3.0)

## 2019-02-14 NOTE — Patient Instructions (Addendum)
Description   Called spoke with pt, advised to take 1.5  tablets today, then continue taking 1 tablet daily, except 1/2 tablet on Mondays and Thursdays. Recheck INR in 1 week.

## 2019-02-15 ENCOUNTER — Other Ambulatory Visit: Payer: Self-pay | Admitting: *Deleted

## 2019-02-15 ENCOUNTER — Telehealth: Payer: Self-pay | Admitting: Cardiovascular Disease

## 2019-02-15 DIAGNOSIS — I272 Pulmonary hypertension, unspecified: Secondary | ICD-10-CM | POA: Diagnosis not present

## 2019-02-15 DIAGNOSIS — Z952 Presence of prosthetic heart valve: Secondary | ICD-10-CM | POA: Diagnosis not present

## 2019-02-15 DIAGNOSIS — I130 Hypertensive heart and chronic kidney disease with heart failure and stage 1 through stage 4 chronic kidney disease, or unspecified chronic kidney disease: Secondary | ICD-10-CM | POA: Diagnosis not present

## 2019-02-15 DIAGNOSIS — M109 Gout, unspecified: Secondary | ICD-10-CM | POA: Diagnosis not present

## 2019-02-15 DIAGNOSIS — M1990 Unspecified osteoarthritis, unspecified site: Secondary | ICD-10-CM | POA: Diagnosis not present

## 2019-02-15 DIAGNOSIS — Z950 Presence of cardiac pacemaker: Secondary | ICD-10-CM | POA: Diagnosis not present

## 2019-02-15 DIAGNOSIS — I4892 Unspecified atrial flutter: Secondary | ICD-10-CM | POA: Diagnosis not present

## 2019-02-15 DIAGNOSIS — I50810 Right heart failure, unspecified: Secondary | ICD-10-CM | POA: Diagnosis not present

## 2019-02-15 DIAGNOSIS — Z8673 Personal history of transient ischemic attack (TIA), and cerebral infarction without residual deficits: Secondary | ICD-10-CM | POA: Diagnosis not present

## 2019-02-15 DIAGNOSIS — I082 Rheumatic disorders of both aortic and tricuspid valves: Secondary | ICD-10-CM | POA: Diagnosis not present

## 2019-02-15 DIAGNOSIS — Z7901 Long term (current) use of anticoagulants: Secondary | ICD-10-CM | POA: Diagnosis not present

## 2019-02-15 DIAGNOSIS — I5033 Acute on chronic diastolic (congestive) heart failure: Secondary | ICD-10-CM | POA: Diagnosis not present

## 2019-02-15 DIAGNOSIS — I4821 Permanent atrial fibrillation: Secondary | ICD-10-CM | POA: Diagnosis not present

## 2019-02-15 DIAGNOSIS — I2510 Atherosclerotic heart disease of native coronary artery without angina pectoris: Secondary | ICD-10-CM | POA: Diagnosis not present

## 2019-02-15 DIAGNOSIS — Z955 Presence of coronary angioplasty implant and graft: Secondary | ICD-10-CM | POA: Diagnosis not present

## 2019-02-15 DIAGNOSIS — I5022 Chronic systolic (congestive) heart failure: Secondary | ICD-10-CM | POA: Diagnosis not present

## 2019-02-15 DIAGNOSIS — K219 Gastro-esophageal reflux disease without esophagitis: Secondary | ICD-10-CM | POA: Diagnosis not present

## 2019-02-15 DIAGNOSIS — I252 Old myocardial infarction: Secondary | ICD-10-CM | POA: Diagnosis not present

## 2019-02-15 DIAGNOSIS — E7800 Pure hypercholesterolemia, unspecified: Secondary | ICD-10-CM | POA: Diagnosis not present

## 2019-02-15 DIAGNOSIS — D631 Anemia in chronic kidney disease: Secondary | ICD-10-CM | POA: Diagnosis not present

## 2019-02-15 DIAGNOSIS — N183 Chronic kidney disease, stage 3 (moderate): Secondary | ICD-10-CM | POA: Diagnosis not present

## 2019-02-15 DIAGNOSIS — I495 Sick sinus syndrome: Secondary | ICD-10-CM | POA: Diagnosis not present

## 2019-02-15 NOTE — Telephone Encounter (Signed)
New Message     Donita from [facility] is calling because the Pt is wearing leg wraps and she is concerned because they have been on for over a week and shes not sure if she should move them.    Please call

## 2019-02-15 NOTE — Telephone Encounter (Signed)
Reviewed discharge summary and not sure directions on leg wraps will forward to CHF clinic to get directions ./cy

## 2019-02-16 DIAGNOSIS — I252 Old myocardial infarction: Secondary | ICD-10-CM | POA: Diagnosis not present

## 2019-02-16 DIAGNOSIS — E7800 Pure hypercholesterolemia, unspecified: Secondary | ICD-10-CM | POA: Diagnosis not present

## 2019-02-16 DIAGNOSIS — K219 Gastro-esophageal reflux disease without esophagitis: Secondary | ICD-10-CM | POA: Diagnosis not present

## 2019-02-16 DIAGNOSIS — I272 Pulmonary hypertension, unspecified: Secondary | ICD-10-CM | POA: Diagnosis not present

## 2019-02-16 DIAGNOSIS — I4821 Permanent atrial fibrillation: Secondary | ICD-10-CM | POA: Diagnosis not present

## 2019-02-16 DIAGNOSIS — I50810 Right heart failure, unspecified: Secondary | ICD-10-CM | POA: Diagnosis not present

## 2019-02-16 DIAGNOSIS — I4892 Unspecified atrial flutter: Secondary | ICD-10-CM | POA: Diagnosis not present

## 2019-02-16 DIAGNOSIS — I495 Sick sinus syndrome: Secondary | ICD-10-CM | POA: Diagnosis not present

## 2019-02-16 DIAGNOSIS — N183 Chronic kidney disease, stage 3 (moderate): Secondary | ICD-10-CM | POA: Diagnosis not present

## 2019-02-16 DIAGNOSIS — I082 Rheumatic disorders of both aortic and tricuspid valves: Secondary | ICD-10-CM | POA: Diagnosis not present

## 2019-02-16 DIAGNOSIS — I130 Hypertensive heart and chronic kidney disease with heart failure and stage 1 through stage 4 chronic kidney disease, or unspecified chronic kidney disease: Secondary | ICD-10-CM | POA: Diagnosis not present

## 2019-02-16 DIAGNOSIS — I5022 Chronic systolic (congestive) heart failure: Secondary | ICD-10-CM | POA: Diagnosis not present

## 2019-02-16 DIAGNOSIS — Z7901 Long term (current) use of anticoagulants: Secondary | ICD-10-CM | POA: Diagnosis not present

## 2019-02-16 DIAGNOSIS — Z955 Presence of coronary angioplasty implant and graft: Secondary | ICD-10-CM | POA: Diagnosis not present

## 2019-02-16 DIAGNOSIS — D631 Anemia in chronic kidney disease: Secondary | ICD-10-CM | POA: Diagnosis not present

## 2019-02-16 DIAGNOSIS — M1990 Unspecified osteoarthritis, unspecified site: Secondary | ICD-10-CM | POA: Diagnosis not present

## 2019-02-16 DIAGNOSIS — M109 Gout, unspecified: Secondary | ICD-10-CM | POA: Diagnosis not present

## 2019-02-16 DIAGNOSIS — Z8673 Personal history of transient ischemic attack (TIA), and cerebral infarction without residual deficits: Secondary | ICD-10-CM | POA: Diagnosis not present

## 2019-02-16 DIAGNOSIS — Z952 Presence of prosthetic heart valve: Secondary | ICD-10-CM | POA: Diagnosis not present

## 2019-02-16 DIAGNOSIS — I5033 Acute on chronic diastolic (congestive) heart failure: Secondary | ICD-10-CM | POA: Diagnosis not present

## 2019-02-16 DIAGNOSIS — I2510 Atherosclerotic heart disease of native coronary artery without angina pectoris: Secondary | ICD-10-CM | POA: Diagnosis not present

## 2019-02-16 DIAGNOSIS — Z950 Presence of cardiac pacemaker: Secondary | ICD-10-CM | POA: Diagnosis not present

## 2019-02-17 ENCOUNTER — Inpatient Hospital Stay (HOSPITAL_COMMUNITY): Payer: Medicare Other

## 2019-02-17 ENCOUNTER — Encounter (HOSPITAL_COMMUNITY): Payer: Self-pay | Admitting: Certified Registered Nurse Anesthetist

## 2019-02-17 ENCOUNTER — Emergency Department (HOSPITAL_COMMUNITY): Payer: Medicare Other

## 2019-02-17 ENCOUNTER — Emergency Department (HOSPITAL_COMMUNITY): Payer: Medicare Other | Admitting: Anesthesiology

## 2019-02-17 ENCOUNTER — Inpatient Hospital Stay (HOSPITAL_COMMUNITY)
Admission: EM | Admit: 2019-02-17 | Discharge: 2019-02-24 | DRG: 023 | Disposition: A | Discharge status: Rehabilitation Facility | Payer: Medicare Other | Attending: Neurology | Admitting: Neurology

## 2019-02-17 ENCOUNTER — Encounter (HOSPITAL_COMMUNITY)
Admission: EM | Disposition: A | Discharge status: Rehabilitation Facility | Payer: Self-pay | Source: Home / Self Care | Attending: Neurology

## 2019-02-17 DIAGNOSIS — I63511 Cerebral infarction due to unspecified occlusion or stenosis of right middle cerebral artery: Secondary | ICD-10-CM

## 2019-02-17 DIAGNOSIS — E873 Alkalosis: Secondary | ICD-10-CM | POA: Diagnosis not present

## 2019-02-17 DIAGNOSIS — I6349 Cerebral infarction due to embolism of other cerebral artery: Secondary | ICD-10-CM | POA: Diagnosis not present

## 2019-02-17 DIAGNOSIS — Z9889 Other specified postprocedural states: Secondary | ICD-10-CM | POA: Diagnosis not present

## 2019-02-17 DIAGNOSIS — I071 Rheumatic tricuspid insufficiency: Secondary | ICD-10-CM | POA: Diagnosis not present

## 2019-02-17 DIAGNOSIS — Z952 Presence of prosthetic heart valve: Secondary | ICD-10-CM | POA: Diagnosis not present

## 2019-02-17 DIAGNOSIS — I63411 Cerebral infarction due to embolism of right middle cerebral artery: Secondary | ICD-10-CM | POA: Diagnosis not present

## 2019-02-17 DIAGNOSIS — I69328 Other speech and language deficits following cerebral infarction: Secondary | ICD-10-CM | POA: Diagnosis not present

## 2019-02-17 DIAGNOSIS — I5043 Acute on chronic combined systolic (congestive) and diastolic (congestive) heart failure: Secondary | ICD-10-CM | POA: Diagnosis not present

## 2019-02-17 DIAGNOSIS — I5032 Chronic diastolic (congestive) heart failure: Secondary | ICD-10-CM | POA: Diagnosis not present

## 2019-02-17 DIAGNOSIS — N179 Acute kidney failure, unspecified: Secondary | ICD-10-CM | POA: Diagnosis not present

## 2019-02-17 DIAGNOSIS — K219 Gastro-esophageal reflux disease without esophagitis: Secondary | ICD-10-CM | POA: Diagnosis present

## 2019-02-17 DIAGNOSIS — I69398 Other sequelae of cerebral infarction: Secondary | ICD-10-CM | POA: Diagnosis not present

## 2019-02-17 DIAGNOSIS — R0902 Hypoxemia: Secondary | ICD-10-CM | POA: Diagnosis not present

## 2019-02-17 DIAGNOSIS — I2510 Atherosclerotic heart disease of native coronary artery without angina pectoris: Secondary | ICD-10-CM | POA: Diagnosis not present

## 2019-02-17 DIAGNOSIS — R0602 Shortness of breath: Secondary | ICD-10-CM

## 2019-02-17 DIAGNOSIS — T462X5A Adverse effect of other antidysrhythmic drugs, initial encounter: Secondary | ICD-10-CM | POA: Diagnosis present

## 2019-02-17 DIAGNOSIS — K920 Hematemesis: Secondary | ICD-10-CM | POA: Diagnosis not present

## 2019-02-17 DIAGNOSIS — Z7901 Long term (current) use of anticoagulants: Secondary | ICD-10-CM

## 2019-02-17 DIAGNOSIS — R269 Unspecified abnormalities of gait and mobility: Secondary | ICD-10-CM | POA: Diagnosis not present

## 2019-02-17 DIAGNOSIS — Z950 Presence of cardiac pacemaker: Secondary | ICD-10-CM | POA: Diagnosis present

## 2019-02-17 DIAGNOSIS — R29715 NIHSS score 15: Secondary | ICD-10-CM | POA: Diagnosis not present

## 2019-02-17 DIAGNOSIS — I4821 Permanent atrial fibrillation: Secondary | ICD-10-CM | POA: Diagnosis present

## 2019-02-17 DIAGNOSIS — J189 Pneumonia, unspecified organism: Secondary | ICD-10-CM | POA: Diagnosis not present

## 2019-02-17 DIAGNOSIS — G479 Sleep disorder, unspecified: Secondary | ICD-10-CM

## 2019-02-17 DIAGNOSIS — E46 Unspecified protein-calorie malnutrition: Secondary | ICD-10-CM | POA: Diagnosis present

## 2019-02-17 DIAGNOSIS — E876 Hypokalemia: Secondary | ICD-10-CM | POA: Diagnosis not present

## 2019-02-17 DIAGNOSIS — I69391 Dysphagia following cerebral infarction: Secondary | ICD-10-CM | POA: Diagnosis not present

## 2019-02-17 DIAGNOSIS — E785 Hyperlipidemia, unspecified: Secondary | ICD-10-CM | POA: Diagnosis present

## 2019-02-17 DIAGNOSIS — Z8679 Personal history of other diseases of the circulatory system: Secondary | ICD-10-CM | POA: Diagnosis not present

## 2019-02-17 DIAGNOSIS — I082 Rheumatic disorders of both aortic and tricuspid valves: Secondary | ICD-10-CM | POA: Diagnosis present

## 2019-02-17 DIAGNOSIS — R4020 Unspecified coma: Secondary | ICD-10-CM | POA: Diagnosis not present

## 2019-02-17 DIAGNOSIS — J9690 Respiratory failure, unspecified, unspecified whether with hypoxia or hypercapnia: Secondary | ICD-10-CM

## 2019-02-17 DIAGNOSIS — Z20828 Contact with and (suspected) exposure to other viral communicable diseases: Secondary | ICD-10-CM | POA: Diagnosis present

## 2019-02-17 DIAGNOSIS — R042 Hemoptysis: Secondary | ICD-10-CM | POA: Diagnosis not present

## 2019-02-17 DIAGNOSIS — Z833 Family history of diabetes mellitus: Secondary | ICD-10-CM

## 2019-02-17 DIAGNOSIS — R29818 Other symptoms and signs involving the nervous system: Secondary | ICD-10-CM | POA: Diagnosis not present

## 2019-02-17 DIAGNOSIS — I609 Nontraumatic subarachnoid hemorrhage, unspecified: Secondary | ICD-10-CM | POA: Diagnosis not present

## 2019-02-17 DIAGNOSIS — N3289 Other specified disorders of bladder: Secondary | ICD-10-CM | POA: Diagnosis not present

## 2019-02-17 DIAGNOSIS — I6200 Nontraumatic subdural hemorrhage, unspecified: Secondary | ICD-10-CM | POA: Diagnosis not present

## 2019-02-17 DIAGNOSIS — Z823 Family history of stroke: Secondary | ICD-10-CM

## 2019-02-17 DIAGNOSIS — R791 Abnormal coagulation profile: Secondary | ICD-10-CM | POA: Diagnosis present

## 2019-02-17 DIAGNOSIS — J849 Interstitial pulmonary disease, unspecified: Secondary | ICD-10-CM | POA: Diagnosis not present

## 2019-02-17 DIAGNOSIS — R531 Weakness: Secondary | ICD-10-CM | POA: Diagnosis not present

## 2019-02-17 DIAGNOSIS — R680 Hypothermia, not associated with low environmental temperature: Secondary | ICD-10-CM | POA: Diagnosis not present

## 2019-02-17 DIAGNOSIS — M19011 Primary osteoarthritis, right shoulder: Secondary | ICD-10-CM | POA: Diagnosis not present

## 2019-02-17 DIAGNOSIS — I5033 Acute on chronic diastolic (congestive) heart failure: Secondary | ICD-10-CM | POA: Diagnosis not present

## 2019-02-17 DIAGNOSIS — Z4682 Encounter for fitting and adjustment of non-vascular catheter: Secondary | ICD-10-CM | POA: Diagnosis not present

## 2019-02-17 DIAGNOSIS — J9601 Acute respiratory failure with hypoxia: Secondary | ICD-10-CM | POA: Diagnosis not present

## 2019-02-17 DIAGNOSIS — R29810 Facial weakness: Secondary | ICD-10-CM | POA: Diagnosis present

## 2019-02-17 DIAGNOSIS — I959 Hypotension, unspecified: Secondary | ICD-10-CM | POA: Diagnosis not present

## 2019-02-17 DIAGNOSIS — Z806 Family history of leukemia: Secondary | ICD-10-CM

## 2019-02-17 DIAGNOSIS — N183 Chronic kidney disease, stage 3 unspecified: Secondary | ICD-10-CM | POA: Diagnosis present

## 2019-02-17 DIAGNOSIS — I130 Hypertensive heart and chronic kidney disease with heart failure and stage 1 through stage 4 chronic kidney disease, or unspecified chronic kidney disease: Secondary | ICD-10-CM | POA: Diagnosis not present

## 2019-02-17 DIAGNOSIS — I252 Old myocardial infarction: Secondary | ICD-10-CM | POA: Diagnosis not present

## 2019-02-17 DIAGNOSIS — G8194 Hemiplegia, unspecified affecting left nondominant side: Secondary | ICD-10-CM | POA: Diagnosis not present

## 2019-02-17 DIAGNOSIS — I639 Cerebral infarction, unspecified: Secondary | ICD-10-CM | POA: Diagnosis not present

## 2019-02-17 DIAGNOSIS — I618 Other nontraumatic intracerebral hemorrhage: Secondary | ICD-10-CM | POA: Diagnosis not present

## 2019-02-17 DIAGNOSIS — Z6824 Body mass index (BMI) 24.0-24.9, adult: Secondary | ICD-10-CM

## 2019-02-17 DIAGNOSIS — Z955 Presence of coronary angioplasty implant and graft: Secondary | ICD-10-CM | POA: Diagnosis not present

## 2019-02-17 DIAGNOSIS — I6601 Occlusion and stenosis of right middle cerebral artery: Secondary | ICD-10-CM | POA: Diagnosis not present

## 2019-02-17 DIAGNOSIS — Z885 Allergy status to narcotic agent status: Secondary | ICD-10-CM

## 2019-02-17 DIAGNOSIS — J9600 Acute respiratory failure, unspecified whether with hypoxia or hypercapnia: Secondary | ICD-10-CM | POA: Diagnosis not present

## 2019-02-17 DIAGNOSIS — J90 Pleural effusion, not elsewhere classified: Secondary | ICD-10-CM | POA: Diagnosis not present

## 2019-02-17 DIAGNOSIS — I4891 Unspecified atrial fibrillation: Secondary | ICD-10-CM

## 2019-02-17 DIAGNOSIS — R471 Dysarthria and anarthria: Secondary | ICD-10-CM | POA: Diagnosis present

## 2019-02-17 DIAGNOSIS — I495 Sick sinus syndrome: Secondary | ICD-10-CM | POA: Diagnosis not present

## 2019-02-17 DIAGNOSIS — I2729 Other secondary pulmonary hypertension: Secondary | ICD-10-CM | POA: Diagnosis present

## 2019-02-17 DIAGNOSIS — R1310 Dysphagia, unspecified: Secondary | ICD-10-CM | POA: Diagnosis present

## 2019-02-17 DIAGNOSIS — Z01818 Encounter for other preprocedural examination: Secondary | ICD-10-CM

## 2019-02-17 DIAGNOSIS — I5042 Chronic combined systolic (congestive) and diastolic (congestive) heart failure: Secondary | ICD-10-CM | POA: Diagnosis not present

## 2019-02-17 DIAGNOSIS — D649 Anemia, unspecified: Secondary | ICD-10-CM | POA: Diagnosis present

## 2019-02-17 DIAGNOSIS — D638 Anemia in other chronic diseases classified elsewhere: Secondary | ICD-10-CM | POA: Diagnosis not present

## 2019-02-17 DIAGNOSIS — S065X9A Traumatic subdural hemorrhage with loss of consciousness of unspecified duration, initial encounter: Secondary | ICD-10-CM | POA: Diagnosis not present

## 2019-02-17 DIAGNOSIS — T17910A Gastric contents in respiratory tract, part unspecified causing asphyxiation, initial encounter: Secondary | ICD-10-CM | POA: Diagnosis not present

## 2019-02-17 DIAGNOSIS — G8191 Hemiplegia, unspecified affecting right dominant side: Secondary | ICD-10-CM | POA: Diagnosis not present

## 2019-02-17 DIAGNOSIS — D62 Acute posthemorrhagic anemia: Secondary | ICD-10-CM | POA: Diagnosis not present

## 2019-02-17 DIAGNOSIS — G43909 Migraine, unspecified, not intractable, without status migrainosus: Secondary | ICD-10-CM | POA: Diagnosis present

## 2019-02-17 DIAGNOSIS — Z8249 Family history of ischemic heart disease and other diseases of the circulatory system: Secondary | ICD-10-CM

## 2019-02-17 DIAGNOSIS — I69392 Facial weakness following cerebral infarction: Secondary | ICD-10-CM | POA: Diagnosis not present

## 2019-02-17 DIAGNOSIS — I482 Chronic atrial fibrillation, unspecified: Secondary | ICD-10-CM | POA: Diagnosis not present

## 2019-02-17 DIAGNOSIS — R4781 Slurred speech: Secondary | ICD-10-CM | POA: Diagnosis not present

## 2019-02-17 DIAGNOSIS — I69354 Hemiplegia and hemiparesis following cerebral infarction affecting left non-dominant side: Secondary | ICD-10-CM | POA: Diagnosis not present

## 2019-02-17 DIAGNOSIS — J9811 Atelectasis: Secondary | ICD-10-CM | POA: Diagnosis not present

## 2019-02-17 DIAGNOSIS — K5730 Diverticulosis of large intestine without perforation or abscess without bleeding: Secondary | ICD-10-CM | POA: Diagnosis present

## 2019-02-17 DIAGNOSIS — Z9861 Coronary angioplasty status: Secondary | ICD-10-CM

## 2019-02-17 DIAGNOSIS — I69351 Hemiplegia and hemiparesis following cerebral infarction affecting right dominant side: Secondary | ICD-10-CM | POA: Diagnosis not present

## 2019-02-17 HISTORY — PX: RADIOLOGY WITH ANESTHESIA: SHX6223

## 2019-02-17 HISTORY — PX: IR ANGIO VERTEBRAL SEL SUBCLAVIAN INNOMINATE UNI R MOD SED: IMG5365

## 2019-02-17 HISTORY — PX: IR CT HEAD LTD: IMG2386

## 2019-02-17 HISTORY — PX: IR PERCUTANEOUS ART THROMBECTOMY/INFUSION INTRACRANIAL INC DIAG ANGIO: IMG6087

## 2019-02-17 LAB — DIFFERENTIAL
Abs Immature Granulocytes: 0.01 10*3/uL (ref 0.00–0.07)
Basophils Absolute: 0 10*3/uL (ref 0.0–0.1)
Basophils Relative: 1 %
Eosinophils Absolute: 0.1 10*3/uL (ref 0.0–0.5)
Eosinophils Relative: 3 %
Immature Granulocytes: 0 %
Lymphocytes Relative: 18 %
Lymphs Abs: 0.7 10*3/uL (ref 0.7–4.0)
Monocytes Absolute: 0.6 10*3/uL (ref 0.1–1.0)
Monocytes Relative: 16 %
Neutro Abs: 2.3 10*3/uL (ref 1.7–7.7)
Neutrophils Relative %: 62 %

## 2019-02-17 LAB — CBC
HCT: 29.9 % — ABNORMAL LOW (ref 36.0–46.0)
Hemoglobin: 9.4 g/dL — ABNORMAL LOW (ref 12.0–15.0)
MCH: 29.4 pg (ref 26.0–34.0)
MCHC: 31.4 g/dL (ref 30.0–36.0)
MCV: 93.4 fL (ref 80.0–100.0)
Platelets: 143 10*3/uL — ABNORMAL LOW (ref 150–400)
RBC: 3.2 MIL/uL — ABNORMAL LOW (ref 3.87–5.11)
RDW: 26.9 % — ABNORMAL HIGH (ref 11.5–15.5)
WBC: 3.7 10*3/uL — ABNORMAL LOW (ref 4.0–10.5)
nRBC: 0.5 % — ABNORMAL HIGH (ref 0.0–0.2)

## 2019-02-17 LAB — URINALYSIS, ROUTINE W REFLEX MICROSCOPIC
Bilirubin Urine: NEGATIVE
Glucose, UA: NEGATIVE mg/dL
Ketones, ur: NEGATIVE mg/dL
Nitrite: NEGATIVE
Protein, ur: NEGATIVE mg/dL
Specific Gravity, Urine: 1.016 (ref 1.005–1.030)
pH: 7 (ref 5.0–8.0)

## 2019-02-17 LAB — I-STAT CHEM 8, ED
BUN: 46 mg/dL — ABNORMAL HIGH (ref 8–23)
Calcium, Ion: 1.24 mmol/L (ref 1.15–1.40)
Chloride: 109 mmol/L (ref 98–111)
Creatinine, Ser: 1.4 mg/dL — ABNORMAL HIGH (ref 0.44–1.00)
Glucose, Bld: 89 mg/dL (ref 70–99)
HCT: 33 % — ABNORMAL LOW (ref 36.0–46.0)
Hemoglobin: 11.2 g/dL — ABNORMAL LOW (ref 12.0–15.0)
Potassium: 4.9 mmol/L (ref 3.5–5.1)
Sodium: 140 mmol/L (ref 135–145)
TCO2: 23 mmol/L (ref 22–32)

## 2019-02-17 LAB — SARS CORONAVIRUS 2 BY RT PCR (HOSPITAL ORDER, PERFORMED IN ~~LOC~~ HOSPITAL LAB): SARS Coronavirus 2: NEGATIVE

## 2019-02-17 LAB — RAPID URINE DRUG SCREEN, HOSP PERFORMED
Amphetamines: NOT DETECTED
Barbiturates: NOT DETECTED
Benzodiazepines: NOT DETECTED
Cocaine: NOT DETECTED
Opiates: NOT DETECTED
Tetrahydrocannabinol: NOT DETECTED

## 2019-02-17 LAB — GLUCOSE, CAPILLARY
Glucose-Capillary: 163 mg/dL — ABNORMAL HIGH (ref 70–99)
Glucose-Capillary: 54 mg/dL — ABNORMAL LOW (ref 70–99)
Glucose-Capillary: 88 mg/dL (ref 70–99)
Glucose-Capillary: 87 mg/dL (ref 70–99)

## 2019-02-17 LAB — COMPREHENSIVE METABOLIC PANEL
ALT: 23 U/L (ref 0–44)
AST: 98 U/L — ABNORMAL HIGH (ref 15–41)
Albumin: 3.3 g/dL — ABNORMAL LOW (ref 3.5–5.0)
Alkaline Phosphatase: 129 U/L — ABNORMAL HIGH (ref 38–126)
Anion gap: 10 (ref 5–15)
BUN: 41 mg/dL — ABNORMAL HIGH (ref 8–23)
CO2: 20 mmol/L — ABNORMAL LOW (ref 22–32)
Calcium: 9.6 mg/dL (ref 8.9–10.3)
Chloride: 107 mmol/L (ref 98–111)
Creatinine, Ser: 1.38 mg/dL — ABNORMAL HIGH (ref 0.44–1.00)
GFR calc Af Amer: 43 mL/min — ABNORMAL LOW (ref >60)
GFR calc non Af Amer: 37 mL/min — ABNORMAL LOW (ref >60)
Glucose, Bld: 94 mg/dL (ref 70–99)
Potassium: 4.9 mmol/L (ref 3.5–5.1)
Sodium: 137 mmol/L (ref 135–145)
Total Bilirubin: 1.2 mg/dL (ref 0.3–1.2)
Total Protein: 8 g/dL (ref 6.5–8.1)

## 2019-02-17 LAB — POCT I-STAT 7, (LYTES, BLD GAS, ICA,H+H)
Acid-base deficit: 1 mmol/L (ref 0.0–2.0)
Bicarbonate: 20.7 mmol/L (ref 20.0–28.0)
Calcium, Ion: 1.25 mmol/L (ref 1.15–1.40)
HCT: 31 % — ABNORMAL LOW (ref 36.0–46.0)
Hemoglobin: 10.5 g/dL — ABNORMAL LOW (ref 12.0–15.0)
O2 Saturation: 99 %
Patient temperature: 94.4
Potassium: 3.9 mmol/L (ref 3.5–5.1)
Sodium: 140 mmol/L (ref 135–145)
TCO2: 21 mmol/L — ABNORMAL LOW (ref 22–32)
pCO2 arterial: 23 mmHg — ABNORMAL LOW (ref 32.0–48.0)
pH, Arterial: 7.553 — ABNORMAL HIGH (ref 7.350–7.450)
pO2, Arterial: 130 mmHg — ABNORMAL HIGH (ref 83.0–108.0)

## 2019-02-17 LAB — MAGNESIUM: Magnesium: 2.4 mg/dL (ref 1.7–2.4)

## 2019-02-17 LAB — TSH: TSH: 3.983 u[IU]/mL (ref 0.350–4.500)

## 2019-02-17 LAB — PROTIME-INR
INR: 1.9 — ABNORMAL HIGH (ref 0.8–1.2)
Prothrombin Time: 21.9 seconds — ABNORMAL HIGH (ref 11.4–15.2)

## 2019-02-17 LAB — CBG MONITORING, ED: Glucose-Capillary: 113 mg/dL — ABNORMAL HIGH (ref 70–99)

## 2019-02-17 LAB — CORTISOL: Cortisol, Plasma: 9.4 ug/dL

## 2019-02-17 LAB — MRSA PCR SCREENING: MRSA by PCR: NEGATIVE

## 2019-02-17 LAB — ETHANOL: Alcohol, Ethyl (B): <10 mg/dL (ref <10)

## 2019-02-17 LAB — APTT: aPTT: 39 seconds — ABNORMAL HIGH (ref 24–36)

## 2019-02-17 SURGERY — IR WITH ANESTHESIA
Anesthesia: General

## 2019-02-17 MED ORDER — FENTANYL CITRATE (PF) 100 MCG/2ML IJ SOLN
INTRAMUSCULAR | Status: AC
  Filled 2019-02-17: qty 2

## 2019-02-17 MED ORDER — PHENYLEPHRINE HCL-NACL 10-0.9 MG/250ML-% IV SOLN
0.0000 ug/min | INTRAVENOUS | Status: DC
  Administered 2019-02-17: 50 ug/min via INTRAVENOUS
  Administered 2019-02-17: 20 ug/min via INTRAVENOUS
  Administered 2019-02-18: 50 ug/min via INTRAVENOUS
  Administered 2019-02-18: 40 ug/min via INTRAVENOUS
  Filled 2019-02-17 (×5): qty 250

## 2019-02-17 MED ORDER — METOPROLOL TARTRATE 25 MG PO TABS: 25.0000 mg | ORAL_TABLET | Freq: Two times a day (BID) | ORAL | Status: DC

## 2019-02-17 MED ORDER — PROPOFOL 500 MG/50ML IV EMUL
INTRAVENOUS | Status: DC | PRN
  Administered 2019-02-17: 50 ug/kg/min via INTRAVENOUS

## 2019-02-17 MED ORDER — FENTANYL CITRATE (PF) 100 MCG/2ML IJ SOLN
50.0000 ug | INTRAMUSCULAR | Status: DC | PRN
  Administered 2019-02-19: 50 ug via INTRAVENOUS
  Filled 2019-02-17: qty 2

## 2019-02-17 MED ORDER — TIROFIBAN HCL IN NACL 5-0.9 MG/100ML-% IV SOLN
INTRAVENOUS | Status: AC
  Filled 2019-02-17: qty 100

## 2019-02-17 MED ORDER — PHENYLEPHRINE HCL (PRESSORS) 10 MG/ML IV SOLN
INTRAVENOUS | Status: DC | PRN
  Administered 2019-02-17 (×2): 80 ug via INTRAVENOUS

## 2019-02-17 MED ORDER — DILTIAZEM HCL-DEXTROSE 100-5 MG/100ML-% IV SOLN (PREMIX): 5.0000 mg/h | INTRAVENOUS | Status: DC

## 2019-02-17 MED ORDER — CLEVIDIPINE BUTYRATE 0.5 MG/ML IV EMUL
0.0000 mg/h | INTRAVENOUS | Status: DC
  Administered 2019-02-17: 1 mg/h via INTRAVENOUS
  Filled 2019-02-17 (×2): qty 50

## 2019-02-17 MED ORDER — POLYVINYL ALCOHOL 1.4 % OP SOLN
1.0000 [drp] | OPHTHALMIC | Status: DC | PRN
  Filled 2019-02-17: qty 15

## 2019-02-17 MED ORDER — SODIUM CHLORIDE 0.9 % IV SOLN
INTRAVENOUS | Status: DC | PRN
  Administered 2019-02-17: 45 ug/min via INTRAVENOUS

## 2019-02-17 MED ORDER — TICAGRELOR 90 MG PO TABS
ORAL_TABLET | ORAL | Status: AC
  Filled 2019-02-17: qty 2

## 2019-02-17 MED ORDER — DEXTROSE 50 % IV SOLN
INTRAVENOUS | Status: AC
  Filled 2019-02-17: qty 50

## 2019-02-17 MED ORDER — SENNOSIDES-DOCUSATE SODIUM 8.6-50 MG PO TABS: 1.0000 | ORAL_TABLET | Freq: Every evening | ORAL | Status: DC | PRN

## 2019-02-17 MED ORDER — IOHEXOL 300 MG/ML  SOLN
150.0000 mL | Freq: Once | INTRAMUSCULAR | Status: AC | PRN
  Administered 2019-02-17: 76 mL via INTRAVENOUS

## 2019-02-17 MED ORDER — DILTIAZEM HCL-DEXTROSE 100-5 MG/100ML-% IV SOLN (PREMIX)
5.0000 mg/h | INTRAVENOUS | Status: DC
  Filled 2019-02-17 (×2): qty 100

## 2019-02-17 MED ORDER — METOPROLOL TARTRATE 25 MG PO TABS
25.0000 mg | ORAL_TABLET | Freq: Two times a day (BID) | ORAL | Status: DC
  Administered 2019-02-18: 25 mg via ORAL
  Filled 2019-02-17: qty 1

## 2019-02-17 MED ORDER — NITROGLYCERIN 0.4 MG SL SUBL: 0.4000 mg | SUBLINGUAL_TABLET | SUBLINGUAL | Status: DC | PRN

## 2019-02-17 MED ORDER — ROCURONIUM BROMIDE 50 MG/5ML IV SOLN
INTRAVENOUS | Status: DC | PRN
  Administered 2019-02-17: 40 mg via INTRAVENOUS
  Administered 2019-02-17: 60 mg via INTRAVENOUS

## 2019-02-17 MED ORDER — POTASSIUM CHLORIDE CRYS ER 20 MEQ PO TBCR: 20.0000 meq | EXTENDED_RELEASE_TABLET | Freq: Three times a day (TID) | ORAL | Status: DC

## 2019-02-17 MED ORDER — ACETAMINOPHEN 160 MG/5ML PO SOLN: 650.0000 mg | ORAL | Status: DC | PRN

## 2019-02-17 MED ORDER — SODIUM CHLORIDE (PF) 0.9 % IJ SOLN
INTRAVENOUS | Status: DC | PRN
  Administered 2019-02-17 (×3): 25 ug via INTRA_ARTERIAL

## 2019-02-17 MED ORDER — SPIRONOLACTONE 12.5 MG HALF TABLET
12.5000 mg | ORAL_TABLET | Freq: Every day | ORAL | Status: DC
  Administered 2019-02-18 – 2019-02-24 (×7): 12.5 mg via ORAL
  Filled 2019-02-17 (×7): qty 1

## 2019-02-17 MED ORDER — ROCURONIUM BROMIDE 50 MG/5ML IV SOSY
PREFILLED_SYRINGE | INTRAVENOUS | Status: DC | PRN
  Administered 2019-02-17: 50 mg via INTRAVENOUS

## 2019-02-17 MED ORDER — CEFAZOLIN SODIUM-DEXTROSE 2-4 GM/100ML-% IV SOLN
INTRAVENOUS | Status: AC
  Filled 2019-02-17: qty 100

## 2019-02-17 MED ORDER — ATORVASTATIN CALCIUM 40 MG PO TABS
40.0000 mg | ORAL_TABLET | Freq: Every day | ORAL | Status: DC
  Administered 2019-02-17: 40 mg via ORAL
  Filled 2019-02-17: qty 1

## 2019-02-17 MED ORDER — ACETAMINOPHEN 325 MG PO TABS
650.0000 mg | ORAL_TABLET | ORAL | Status: DC | PRN
  Filled 2019-02-17: qty 2

## 2019-02-17 MED ORDER — CHLORHEXIDINE GLUCONATE CLOTH 2 % EX PADS
6.0000 | MEDICATED_PAD | Freq: Every day | CUTANEOUS | Status: DC
  Administered 2019-02-17 – 2019-02-24 (×6): 6 via TOPICAL

## 2019-02-17 MED ORDER — FUROSEMIDE 20 MG PO TABS
80.0000 mg | ORAL_TABLET | Freq: Two times a day (BID) | ORAL | Status: DC
  Administered 2019-02-18: 80 mg via ORAL
  Filled 2019-02-17: qty 4

## 2019-02-17 MED ORDER — LIDOCAINE HCL 1 % IJ SOLN
INTRAMUSCULAR | Status: AC
  Filled 2019-02-17: qty 20

## 2019-02-17 MED ORDER — METOPROLOL TARTRATE 5 MG/5ML IV SOLN
INTRAVENOUS | Status: AC
  Filled 2019-02-17: qty 5

## 2019-02-17 MED ORDER — EPHEDRINE SULFATE 50 MG/ML IJ SOLN
INTRAMUSCULAR | Status: DC | PRN
  Administered 2019-02-17: 5 mg via INTRAVENOUS

## 2019-02-17 MED ORDER — PROPOFOL 1000 MG/100ML IV EMUL
5.0000 ug/kg/min | INTRAVENOUS | Status: DC
  Administered 2019-02-17 – 2019-02-18 (×2): 25 ug/kg/min via INTRAVENOUS
  Filled 2019-02-17: qty 100

## 2019-02-17 MED ORDER — CHLORHEXIDINE GLUCONATE 0.12% ORAL RINSE (MEDLINE KIT)
15.0000 mL | Freq: Two times a day (BID) | OROMUCOSAL | Status: DC
  Administered 2019-02-17 – 2019-02-19 (×4): 15 mL via OROMUCOSAL

## 2019-02-17 MED ORDER — NITROGLYCERIN 1 MG/10 ML FOR IR/CATH LAB
INTRA_ARTERIAL | Status: AC
  Filled 2019-02-17: qty 10

## 2019-02-17 MED ORDER — ORAL CARE MOUTH RINSE
15.0000 mL | OROMUCOSAL | Status: DC
  Administered 2019-02-17 – 2019-02-19 (×17): 15 mL via OROMUCOSAL

## 2019-02-17 MED ORDER — EPTIFIBATIDE 20 MG/10ML IV SOLN
INTRAVENOUS | Status: AC
  Filled 2019-02-17: qty 10

## 2019-02-17 MED ORDER — ACETAMINOPHEN 650 MG RE SUPP: 650.0000 mg | RECTAL | Status: DC | PRN

## 2019-02-17 MED ORDER — FAMOTIDINE 40 MG/5ML PO SUSR
20.0000 mg | Freq: Every day | ORAL | Status: DC
  Administered 2019-02-17 – 2019-02-18 (×2): 20 mg via ORAL
  Filled 2019-02-17 (×2): qty 2.5

## 2019-02-17 MED ORDER — ONDANSETRON HCL 4 MG/2ML IJ SOLN
INTRAMUSCULAR | Status: DC | PRN
  Administered 2019-02-17: 4 mg via INTRAVENOUS

## 2019-02-17 MED ORDER — SODIUM CHLORIDE 0.9 % IV SOLN: INTRAVENOUS | Status: DC

## 2019-02-17 MED ORDER — SODIUM CHLORIDE 0.9 % IV SOLN
INTRAVENOUS | Status: DC
  Administered 2019-02-17 (×3): via INTRAVENOUS

## 2019-02-17 MED ORDER — PROPOFOL 1000 MG/100ML IV EMUL
INTRAVENOUS | Status: AC
  Filled 2019-02-17: qty 100

## 2019-02-17 MED ORDER — BISACODYL 10 MG RE SUPP: 10.0000 mg | Freq: Every day | RECTAL | Status: DC | PRN

## 2019-02-17 MED ORDER — FENTANYL CITRATE (PF) 250 MCG/5ML IJ SOLN
INTRAMUSCULAR | Status: DC | PRN
  Administered 2019-02-17 (×4): 25 ug via INTRAVENOUS

## 2019-02-17 MED ORDER — ASPIRIN 325 MG PO TABS
ORAL_TABLET | ORAL | Status: AC
  Filled 2019-02-17: qty 1

## 2019-02-17 MED ORDER — CEFAZOLIN SODIUM-DEXTROSE 2-3 GM-%(50ML) IV SOLR
INTRAVENOUS | Status: DC | PRN
  Administered 2019-02-17: 2 g via INTRAVENOUS

## 2019-02-17 MED ORDER — ALBUTEROL SULFATE (2.5 MG/3ML) 0.083% IN NEBU
2.5000 mg | INHALATION_SOLUTION | Freq: Four times a day (QID) | RESPIRATORY_TRACT | Status: DC | PRN
  Administered 2019-02-20 – 2019-02-22 (×7): 2.5 mg via RESPIRATORY_TRACT
  Filled 2019-02-17 (×7): qty 3

## 2019-02-17 MED ORDER — DEXTROSE 50 % IV SOLN
25.0000 g | INTRAVENOUS | Status: AC
  Administered 2019-02-17: 16:00:00 25 g via INTRAVENOUS

## 2019-02-17 MED ORDER — STROKE: EARLY STAGES OF RECOVERY BOOK
Freq: Once | Status: AC
  Administered 2019-02-17: 17:00:00
  Filled 2019-02-17: qty 1

## 2019-02-17 MED ORDER — ETOMIDATE 2 MG/ML IV SOLN
INTRAVENOUS | Status: DC | PRN
  Administered 2019-02-17: 20 mg via INTRAVENOUS

## 2019-02-17 MED ORDER — CLOPIDOGREL BISULFATE 300 MG PO TABS
ORAL_TABLET | ORAL | Status: AC
  Filled 2019-02-17: qty 1

## 2019-02-17 MED ORDER — ACETAMINOPHEN 325 MG PO TABS: 650.0000 mg | ORAL_TABLET | ORAL | Status: DC | PRN

## 2019-02-17 MED ORDER — DOCUSATE SODIUM 50 MG/5ML PO LIQD: 100.0000 mg | Freq: Two times a day (BID) | ORAL | Status: DC | PRN

## 2019-02-17 MED ORDER — FUROSEMIDE 20 MG PO TABS: 80.0000 mg | ORAL_TABLET | Freq: Two times a day (BID) | ORAL | Status: DC

## 2019-02-17 MED ORDER — IOHEXOL 350 MG/ML SOLN
75.0000 mL | Freq: Once | INTRAVENOUS | Status: AC | PRN
  Administered 2019-02-17: 75 mL via INTRAVENOUS

## 2019-02-17 MED ORDER — ONDANSETRON HCL 4 MG/2ML IJ SOLN
4.0000 mg | Freq: Four times a day (QID) | INTRAMUSCULAR | Status: DC | PRN
  Administered 2019-02-22: 4 mg via INTRAVENOUS
  Filled 2019-02-17: qty 2

## 2019-02-17 NOTE — Sedation Documentation (Signed)
Pt under the care of anesthesia  

## 2019-02-17 NOTE — Progress Notes (Signed)
Pt transported to 4N24 accompanied by CRNA and RRT without incident; stable VS. Report given to TK bedside. RN checked groin with Probation officer and agrees with assessment. Writer's last NIH, groin, and pulse assessment at 1435.

## 2019-02-17 NOTE — Anesthesia Preprocedure Evaluation (Addendum)
Anesthesia Evaluation   Patient unresponsive    Reviewed: Allergy & Precautions, Patient's Chart, lab work & pertinent test results, Unable to perform ROS - Chart review onlyPreop documentation limited or incomplete due to emergent nature of procedure.  History of Anesthesia Complications Negative for: history of anesthetic complications  Airway Mallampati: Intubated       Dental   Pulmonary    breath sounds clear to auscultation   + intubated    Cardiovascular hypertension, Pt. on home beta blockers and Pt. on medications + CAD, + Past MI and +CHF  + dysrhythmias Atrial Fibrillation + pacemaker (hx SSS) + Valvular Problems/Murmurs (TR) MR  Rhythm:Regular Rate:Normal   '20 RHC - RA = 27 (v waves to 40 with ventricularized wave form) RV = 54/18 PA =69/23 (39) PCW = 19 (v = 23) Fick cardiac output/index = 5.6/3.5 PVR = 3.5 WU FA sat = 99% PA sat = 67%, 69% SVC sat = 73% Assessment/Plan: She is well diuresed. She had mild-moderate PAH but main issues is severe TR. She is not candidate for surgical TVR. Could consider experimental TV clip but currently not enrolling patient with pacemaker wires. Continue medical therapy.   '20 TTE - EF 60-65%. Moderately increased left ventricular wall thickness. There is abnormal septal motion consistent with RV pacemaker and right ventricular volume and pressure overload. The right ventricle has moderately reduced systolic function. The cavity was moderately enlarged. LA and RA sizes were severely dilated. A MVR mechanical valve is present in the mitral position. Tricuspid valve regurgitation is moderate-severe. Mild AI and AS.    Neuro/Psych  Headaches, PSYCHIATRIC DISORDERS Anxiety  Hx SDH  CVA, Residual Symptoms    GI/Hepatic GERD  ,  Endo/Other    Renal/GU CRFRenal disease     Musculoskeletal  (+) Arthritis ,   Abdominal   Peds  Hematology  (+) anemia ,  Thrombocytopenia     Anesthesia Other Findings   Reproductive/Obstetrics                            Anesthesia Physical Anesthesia Plan  ASA: IV and emergent  Anesthesia Plan: General   Post-op Pain Management:    Induction: Inhalational  PONV Risk Score and Plan: 3 and Treatment may vary due to age or medical condition and Ondansetron  Airway Management Planned: Oral ETT  Additional Equipment: Arterial line  Intra-op Plan:   Post-operative Plan: Post-operative intubation/ventilation  Informed Consent:     Only emergency history available and History available from chart only  Plan Discussed with: CRNA and Anesthesiologist  Anesthesia Plan Comments:        Anesthesia Quick Evaluation

## 2019-02-17 NOTE — ED Notes (Signed)
Pt sister Diane [phone]

## 2019-02-17 NOTE — H&P (Addendum)
NEURO HOSPITALIST CONSULT NOTE   Requestig physician: Dr. Johnney Killian  Reason for Consult: Left hemiplegia  History obtained from:  EMS and Chart     HPI:                                                                                                                                          Carrie [name] is an 78 y.o. female who presents as a Code Stroke with sudden onset of right hemiplegia. Time of symptom onset was witnessed by family at 81. She got up from seated position, was able to walk a few steps, then collapsed. She has atrial fibrillation and a mechanical heart valve, anticoagulated with warfarin. Per EMS, she had an LVO score of 6 with findings including right sided weakness. On arrival to the ED she had right sided gaze preference, left facial droop and left hemiplegia. The patient was anxious and agitated but with no specific complaints.    She has an extensive PMHx. Her stroke risk factors include a-fib, mechanincal heart valve, CAD, CHF, HLD, HTN, NSTEMI in 2017 and prior stroke. She has a pacemaker and a history of subdural hematoma as well.   tPA given: No, on warfarin with INR of 1.9 mRS = 0 based on history provided by EMS  Past Medical History:  Diagnosis Date  . Anticoagulated on Coumadin    for mech valve and atrial fib  goal 2.0-2.5  . Anxiety   . Arthritis    "right shoulder" (03/15/2013)  . Atrial fibrillation ([place])   . Atrial flutter ([place])   . CAD (coronary artery disease) 08/11/2016  . CHF (congestive heart failure) ([place])   . Chronic combined systolic and diastolic CHF (congestive heart failure) ([place])    a. 02/2016 Echo: EF 45-50%.  . Diverticula of colon   . Exertional shortness of breath   . GERD (gastroesophageal reflux disease)   . Heart murmur   . Hemorrhage intraabdominal 03/17/2012  . History of blood transfusion    "once" (03/15/2013)  . Hyperlipidemia   . Hypertension   . Liver hemorrhage   . Migraines   . 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 Bi-leaflet St. Jude mechanical prosthesis; b. 02/2016 Echo: EF 45-50%, some degree of MR, sev dil LA/RA, sev TR.  . NSTEMI (non-ST elevated myocardial infarction) ([place])    a. 02/2016 elev trop/Cath: nonobs dzs,   . Pacemaker    medtronic adapta  . Pneumonia 2009   resolved.? OPD Rx  . Severe tricuspid regurgitation    a. 02/2016 Echo: Ef 45-50%, sev TR, PASP 79mmHg.  . Sick sinus syndrome ([place])    Dr Cristopher Peru. EP study negative. Pacemaker 12/15/06 Medtronic  . Stroke [facility])    "they say I had a stroke  last year" denies residual on 03/15/2013  . Subdural hematoma [facility])     Past Surgical History:  Procedure Laterality Date  . APPENDECTOMY    . CARDIAC CATHETERIZATION  04/02/97   R&L:severe MR/pulmonary hypertension  . CARDIAC CATHETERIZATION N/A 03/16/2016   Procedure: Left Heart Cath and Coronary Angiography;  Surgeon: [name], MD;  Location: [place] CV LAB;  Service: Cardiovascular;  Laterality: N/A;  . CARDIAC CATHETERIZATION N/A 09/30/2016   Procedure: Left Heart Cath and Coronary Angiography;  Surgeon: [name], MD;  Location: [place] CV LAB;  Service: Cardiovascular;  Laterality: N/A;  . CARDIAC CATHETERIZATION N/A 09/30/2016   Procedure: Coronary Stent Intervention;  Surgeon: [name], MD;  3.5 x 12 mm resolute Onyx  to ostial RCA  . CATARACT EXTRACTION W/ INTRAOCULAR LENS  IMPLANT, BILATERAL Bilateral 2013  . CORONARY ANGIOPLASTY    . DILATION AND CURETTAGE OF UTERUS     "had fibroids" (03/15/2013)  . EXPLORATORY LAPAROTOMY     "had a growth on my intestines" (03/15/2013)  . HAMMER TOE SURGERY Right   . INSERT / REPLACE / REMOVE PACEMAKER  12/15/2006   Medtronic  . MITRAL VALVE REPLACEMENT  1998   St Jude mechanical; Dr. [name]  . PACEMAKER PLACEMENT  12/15/06   medtronic adapta for SSS  . PERMANENT PACEMAKER GENERATOR CHANGE N/A 07/24/2014   Procedure: PERMANENT PACEMAKER GENERATOR CHANGE;  Surgeon:  [name], MD;  Location: [place] CATH LAB;  Service: Cardiovascular;  Laterality: N/A;  . PERSANTINE CARDIOLITE  08/07/03   mild inf. ischemia   . RIGHT HEART CATH N/A 02/06/2019   Procedure: RIGHT HEART CATH;  Surgeon: [name], MD;  Location: [place] CV LAB;  Service: Cardiovascular;  Laterality: N/A;  . TEE WITHOUT CARDIOVERSION  09/23/2011   Procedure: TRANSESOPHAGEAL ECHOCARDIOGRAM (TEE);  Surgeon: [name];  Location: [facility] ENDOS[facility];  Service: Cardiovascular;  Laterality: N/A;  . TONSILLECTOMY    . TUBAL LIGATION    . US ECHOCARDIOGRAPHY  11/19/2011   EF 50-55%,RA mod to severely dilated,LA severely dilated,trace MR,small vegetation or mass on the MV,AOV mildly scleroticmild PI, RV pressure 40-87mmHg    Family History  Problem Relation Age of Onset  . Cancer Mother   . Stroke Father   . Cancer Sister   . Leukemia Sister   . Healthy Brother   . Healthy Sister   . Diabetes Sister   . Healthy Brother   . Healthy Brother   . Heart attack Maternal Grandfather   . Kidney disease Daughter   . Stroke Brother               Social History:  reports that she has never smoked. She has never used smokeless tobacco. She reports that she does not drink alcohol or use drugs.  Allergies  Allergen Reactions  . Codeine Nausea And Vomiting    HOME MEDICATIONS:  ROS:                                                                                                                                       As per HPI. Detailed ROS deferred in the context of acuity of presentation.    Weight 59 kg.   General Examination:                                                                                                       Physical Exam  HEENT-  [place]/AT    Lungs- Respirations unlabored Extremities- No edema  Neurological Examination Mental Status: Awake  and alert, able to follow all left sided commands. Anxious affect. Speech fluent with intact naming for common objects. Mild dysarthria.  Cranial Nerves: II: Extinction to DSS on the left. Able to detect motion in left hemifield when the stimulation is unilateral. PERRL.  III,IV, VI: No ptosis. Right gaze preference; able to cross midline to the left with difficulty.  VII: Left facial droop.  VIII: hearing intact to voice IX,X: Mild hypophonia XI: Head preferentially rotated to right.  XII: Midline tongue extension Motor: RUE: 5/5 proximally and distally RLE: 5/5 proximally and distally LUE: 4-/5 proximally and distally LLE: 4-/5 proximally and distally Sensory: Decreased sensation to LUE. Also with extinction to DSS on the left with lower extremity stimulation.  Deep Tendon Reflexes:  2+ bilateral brachioradialis and biceps 0 patellae bilaterally 2+ achilles bilaterally Plantars: Right: downgoing   Left: downgoing Cerebellar: No ataxia with FNF on the right. No ataxia with FNF on left but significant latency of motor response to command, as well as slow movement.  Gait: Deferred  Initial NIHSS in CT: 15 Follow up NIHSS after CTA: 10   Lab Results: Basic Metabolic Panel: No results for input(s): NA, K, CL, CO2, GLUCOSE, BUN, CREATININE, CALCIUM, MG, PHOS in the last 168 hours.  CBC: No results for input(s): WBC, NEUTROABS, HGB, HCT, MCV, PLT in the last 168 hours.  Cardiac Enzymes: No results for input(s): CKTOTAL, CKMB, CKMBINDEX, TROPONINI in the last 168 hours.  Lipid Panel: No results for input(s): CHOL, TRIG, HDL, CHOLHDL, VLDL, LDLCALC in the last 168 hours.  Imaging: No results found.   Assessment: 78 year old female with atrial fibrillation and prosthetic heart valve, prior stroke, on warfarin, who presents as a Code Stroke with acute onset of left hemiparesis and rightward gaze deviation.  1. Exam findings are most consistent with extensive right MCA territory  ischemia versus infarction 2. CT head negative for ICH or acute hypodensity 3. CTA head/neck reveals right M1 occlusion 4. Not a tPA candidate due to anticoagulation with warfarin with INR of 1.9 5. Recently had an echocardiogram on 5/15  Recommendations: 1. The patient is an endovascular candidate. Preliminary consent for procedure obtained from the patient after discussion of risks/benefits including 50% average chance of improvement and approximately 10% chance of clinical worsening due to potential hemorrhagic complications of procedure. All questions answered.  2. Following VIR, will admit to the ICU under the Neurology service with plan to include frequent neuro checks and BP management 3. When stable after transfer to the ICU, will monitor with telemetry. Will obtain repeat CT head in 24 hours to rule out secondary hemorrhagic complications. If CT head at 24 hours is negative for hemorrhage or very large area of infarction, will resume anticoagulation.  4. Unable to obtain MRI due to pacemaker 5. Continue atorvastatin 6. When stable, will obtain PT/OT and speech assessments 7. DVT prophylaxis with SCDs 8. Patient being intubated in the ED prior to VIR 9. Has recent negative Covid-19 tests. This is being repeated today.  10. Rate control for a-fib with RVR: PRN labetalol and diltiazem gtt titratable to 5-15 mg/hr  70 minutes spent in the emergent neurological evaluation and management of this critically ill acute stroke patient   Electronically signed: Dr. Kerney Elbe 02/17/2019, 11:32 AM

## 2019-02-17 NOTE — Progress Notes (Signed)
Neurology paged regarding patient's neuro exam.  STAT head CT ordered.

## 2019-02-17 NOTE — Code Documentation (Signed)
78yo female arriving to [facility] via [place] at 65. Patient from home where she was witnessed to have left sided weakness, facial droop and slurred speech at 1015. EMS called and activated a code stroke LVO+. Stroke team at the bedside on patient arrival. Labs drawn and patient cleared for CT by Dr. Johnney Killian. IR pre-notified of potential endovascular candidate. Of note, patient on warfarin with INR 1.6 3 days ago. Patient to CT with team. Initial NIHSS 15, see documentation for details and code stroke times. Patient with right gaze preference, no blink to threat on the left, left facial droop, left arm flaccid, left leg antigravity, dysarthria and extinction. CT completed followed by CTA head and neck. CTA showing right MCA occlusion. Patient reports she has been taking her warfarin as directed. INR resulted at 1.9, contraindicating patient for treatment with tPA. Patient reports that she walks with a cane, but does most ADLs independently. IR notified. Patient back to ED room for intubation by ED team. Patient with improvement in left sided weakness, see documentation. Patient intubated and transported to IR for endovascular intervention. Bedside handoff with IR RN Sallie.

## 2019-02-17 NOTE — Progress Notes (Signed)
Patient ID: Carrie [name], female   DOB: [birth year], 78 y.o.   MRN: [id number] INR. Post revascularization CT brain No ICH or mass effect or shift. RT groin soft . No hematoma . 74F angioseal applied at puncture site for hemostasis. Distal pulses Both DPs and PTs palpable  Unchanged. S.Jorgia Manthei MD

## 2019-02-17 NOTE — ED Provider Notes (Signed)
[facility] EMERGENCY DEPARTMENT Provider Note   CSN: [id number] Arrival date & time: 02/17/19  1118  An emergency department physician performed an initial assessment on this suspected stroke patient at 1120(pfieffer).  History   Chief Complaint No chief complaint on file.   HPI Carrie [name] is a 77 y.o. female.     HPI   Carrie [name] is a 78 y.o. female, with a history of CHF, pacemaker, HTN, hyperlipidemia, GERD, subdural hematoma, NSTEMI, presenting to the ED with left-sided extremity weakness and facial droop with last normal at 10:15 AM.  Patient denies any headache, fall, fever/chills, recent illness, chest pain, shortness of breath, abdominal pain, or any other complaints.    Past Medical History:  Diagnosis Date   Anticoagulated on Coumadin    for mech valve and atrial fib  goal 2.0-2.5   Anxiety    Arthritis    "right shoulder" (03/15/2013)   Atrial fibrillation (HCC)    Atrial flutter (HCC)    CAD (coronary artery disease) 08/11/2016   CHF (congestive heart failure) (HCC)    Chronic combined systolic and diastolic CHF (congestive heart failure) ([place])    a. 02/2016 Echo: EF 45-50%.   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 Bi-leaflet St. Jude mechanical prosthesis; b. 02/2016 Echo: EF 45-50%, some degree of MR, sev dil LA/RA, sev TR.   NSTEMI (non-ST elevated myocardial infarction) ([place])    a. 02/2016 elev trop/Cath: nonobs dzs,    Pacemaker    medtronic adapta   Pneumonia 2009   resolved.? OPD Rx   Severe tricuspid regurgitation    a. 02/2016 Echo: Ef 45-50%, sev TR, PASP 89mHg.   Sick sinus syndrome (HCC)    Dr GCristopher Peru EP study negative. Pacemaker 12/15/06 Medtronic   Stroke ([facility]    "they say I had a stroke last year" denies residual on 03/15/2013   Subdural hematoma ([facility]     Patient Active Problem List   Diagnosis Date Noted   CHF exacerbation ([facility] [facility] 02/03/2019   CHF (congestive heart failure) ([facility] 02/02/2019   SOB (shortness of breath) 02/02/2019   Diarrhea 03/31/2018   Abnormal LFTs 03/31/2018   Hemoptysis 03/30/2018   Elevated troponin 03/30/2018   CKD (chronic kidney disease), stage III ([facility] 03/30/2018   HLD (hyperlipidemia) 03/30/2018   Blurry vision 03/30/2018   Status post coronary artery stent placement    Chronic diastolic heart failure ([facility]    History of mitral valve replacement with mechanical valve    Hypertensive heart disease with heart failure ([facility]    Pure hypercholesterolemia    Coronary artery disease involving native coronary artery of native heart without angina pectoris 08/11/2016   PAH (pulmonary artery hypertension) ([facility] 05/05/2016   Anemia due to other cause    AKI (acute kidney injury) ([facility]    Hyponatremia 03/12/2016   Chest pain 03/11/2016   NSTEMI (non-ST elevated myocardial infarction) ([place] 03/11/2016   Moderate to severe tricuspid regurgitation 11/15/2015   Thrombocytopenia ([facility] 12/03/2014   SSS (sick sinus syndrome) ([facility] 07/24/2014   Longstanding persistent atrial fibrillation 07/24/2014   Pacemaker 07/24/2014   Tachycardia, a fib with  RVR  04/03/2013   Essential hypertension 04/03/2013   SAH (subarachnoid hemorrhage), December 2012 03/21/2012   Hemorrhage, hepatic, Rx'd with Coumadin reversal and embolisation 03/17/12 03/17/2012   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   Chronic anticoagulation, ( INR goal 2.0-2.5 due to history of subdural hematoma and liver hemorrhage) 09/09/2011   Muscle weakness of lower extremity 09/08/2011   Pacemaker - single chamber Medtronic Adapta, 2008  09/08/2011   S/P mitral valve replacement, St Jude 09/06/2011   CONSTIPATION 02/12/2009    Past Surgical History:  Procedure Laterality Date   APPENDECTOMY     CARDIAC CATHETERIZATION  04/02/97   R&L:severe MR/pulmonary hypertension   CARDIAC CATHETERIZATION N/A 03/16/2016   Procedure: Left Heart Cath and Coronary Angiography;  Surgeon: [name], MD;  Location: [place] CV LAB;  Service: Cardiovascular;  Laterality: N/A;   CARDIAC CATHETERIZATION N/A 09/30/2016   Procedure: Left Heart Cath and Coronary Angiography;  Surgeon: [name], MD;  Location: [place] CV LAB;  Service: Cardiovascular;  Laterality: N/A;   CARDIAC CATHETERIZATION N/A 09/30/2016   Procedure: Coronary Stent Intervention;  Surgeon: [name], MD;  3.5 x 12 mm resolute Onyx  to ostial RCA   CATARACT EXTRACTION W/ INTRAOCULAR LENS  IMPLANT, BILATERAL Bilateral 2013   CORONARY ANGIOPLASTY     DILATION AND CURETTAGE OF UTERUS     "had fibroids" (03/15/2013)   EXPLORATORY LAPAROTOMY     "had a growth on my intestines" (03/15/2013)   HAMMER TOE SURGERY Right    INSERT / REPLACE / REMOVE PACEMAKER  12/15/2006   Medtronic   MITRAL VALVE REPLACEMENT  1998   St Jude mechanical; Dr. [name]   PACEMAKER PLACEMENT  12/15/06   medtronic adapta for SSS   PERMANENT PACEMAKER GENERATOR CHANGE N/A 07/24/2014   Procedure: PERMANENT PACEMAKER GENERATOR CHANGE;  Surgeon: [name], MD;  Location: [place] CATH LAB;  Service: Cardiovascular;  Laterality: N/A;   PERSANTINE CARDIOLITE  08/07/03   mild inf. ischemia    RIGHT HEART CATH N/A 02/06/2019   Procedure: RIGHT HEART CATH;  Surgeon: [name], MD;  Location: [place] CV LAB;  Service: Cardiovascular;  Laterality: N/A;   TEE WITHOUT CARDIOVERSION  09/23/2011   Procedure: TRANSESOPHAGEAL ECHOCARDIOGRAM (TEE);  Surgeon: [name];  Location: [facility];  Service: Cardiovascular;  Laterality: N/A;   TONSILLECTOMY      TUBAL LIGATION     US ECHOCARDIOGRAPHY  11/19/2011   EF 50-55%,RA mod to severely dilated,LA severely dilated,trace MR,small vegetation or mass on the MV,AOV mildly scleroticmild PI, RV pressure 40-12mHg     OB History   No obstetric history on file.      Home Medications    Prior to Admission medications   Medication Sig Start Date End Date Taking? Authorizing Provider  atorvastatin (LIPITOR) 40 MG tablet TAKE 1 TABLET BY MOUTH EVERY DAY AT 6PM Patient taking differently: Take 40 mg by mouth daily at 6 PM.  09/27/18   Croitoru, Mihai, MD  Biotin 1000 MCG tablet Take 1,000 mcg by mouth daily.    [provider]  colchicine 0.6 MG tablet Take 1 tablet (0.6 mg total) by mouth daily for 4 days. 02/07/19 02/11/19  HKayleen Memos DO  dextromethorphan-guaiFENesin (MUCINEX DM) 30-600 MG 12hr tablet Take 1 tablet by mouth 2 (two) times daily as needed for cough. 04/01/18   AHosie Poisson MD  docusate sodium (COLACE) 100 MG capsule Take 200 mg by mouth 2 (two) times daily.  [provider]  furosemide (LASIX) 80 MG tablet Take 1 tablet (80 mg total) by mouth 2 (two) times daily. 02/07/19   Kayleen Memos, DO  metolazone (ZAROXOLYN) 2.5 MG tablet Take 2.5 mg when your weight is above 137 lbs as recommended by cardiology. 02/07/19   Kayleen Memos, DO  metoprolol tartrate (LOPRESSOR) 25 MG tablet Take 1 tablet (25 mg total) by mouth 2 (two) times daily. 11/10/16   Croitoru, Mihai, MD  Multiple Vitamin (MULTIVITAMINS PO) Take 1 tablet by mouth daily.     [provider]  nitroGLYCERIN (NITROSTAT) 0.4 MG SL tablet Place 1 tablet (0.4 mg total) under the tongue every 5 (five) minutes as needed for chest pain. 11/20/16   Croitoru, Mihai, MD  Omega-3 Fatty Acids (FISH OIL) 1000 MG CAPS Take 1,000 mg by mouth daily.    [provider]  oxymetazoline (AFRIN) 0.05 % nasal spray Place 1 spray into both nostrils 2 (two) times daily as needed for congestion. 02/07/19   Kayleen Memos, DO  polyethylene glycol (MIRALAX / GLYCOLAX) packet Take 17 g by mouth as needed (for constipation). Reported on 03/25/2016    [provider]  polyvinyl alcohol (LIQUIFILM TEARS) 1.4 % ophthalmic solution Place 1 drop into the left eye as needed for dry eyes. 02/07/19   Kayleen Memos, DO  potassium chloride SA (K-DUR,KLOR-CON) 20 MEQ tablet Take 2 tablets (40 mEq total) by mouth every morning AND 1 tablet (20 mEq total) every evening. 06/01/18   Croitoru, Mihai, MD  spironolactone (ALDACTONE) 25 MG tablet Take 0.5 tablets (12.5 mg total) by mouth daily. 02/08/19   Kayleen Memos, DO  warfarin (COUMADIN) 5 MG tablet TAKE 1 TO 1 AND 1/2 TABLETS BY MOUTH DAILY AS DIRECTED Patient taking differently: Take 2.5-5 mg by mouth See admin instructions. Taking 1/2 tablet (2.5) mg on Monday and Thursday. All other days 35m tablet 12/15/18   Croitoru, Mihai, MD    Family History Family History  Problem Relation Age of Onset   Cancer Mother    Stroke Father    Cancer Sister    Leukemia Sister    Healthy Brother    Healthy Sister    Diabetes Sister    Healthy Brother    Healthy Brother    Heart attack Maternal Grandfather    Kidney disease Daughter    Stroke Brother     Social History Social History   Tobacco Use   Smoking status: Never Smoker   Smokeless tobacco: Never Used  Substance Use Topics   Alcohol use: No   Drug use: No     Allergies   Codeine   Review of Systems Review of Systems  Constitutional: Negative for chills, diaphoresis and fever.  Respiratory: Negative for cough and shortness of breath.   Cardiovascular: Negative for chest pain.  Gastrointestinal: Negative for abdominal pain, diarrhea, nausea and vomiting.  Musculoskeletal: Negative for neck pain.  Neurological: Positive for facial asymmetry, speech difficulty and weakness. Negative for headaches.  All other systems reviewed and are negative.    Physical Exam Updated Vital Signs BP  (!) 109/94 (BP Location: Right Arm)    Pulse (!) 148    Resp 19    Wt 59 kg    SpO2 100%    BMI 24.58 kg/m   Physical Exam Vitals signs and nursing note reviewed.  Constitutional:      General: She is not in acute distress.    Appearance: She is well-developed.  She is not diaphoretic.  HENT:     Head: Normocephalic and atraumatic.     Mouth/Throat:     Mouth: Mucous membranes are moist.     Pharynx: Oropharynx is clear.     Comments: Maintaining her own airway and oral secretions. Eyes:     Extraocular Movements: Extraocular movements intact.     Conjunctiva/sclera: Conjunctivae normal.     Pupils: Pupils are equal, round, and reactive to light.  Neck:     Musculoskeletal: Neck supple.  Cardiovascular:     Rate and Rhythm: Normal rate and regular rhythm.     Pulses: Normal pulses.          Radial pulses are 2+ on the right side and 2+ on the left side.       Posterior tibial pulses are 2+ on the right side and 2+ on the left side.     Heart sounds: Normal heart sounds.     Comments: Tactile temperature in the extremities appropriate and equal bilaterally. Pulmonary:     Effort: Pulmonary effort is normal. No respiratory distress.     Breath sounds: Normal breath sounds.  Abdominal:     Palpations: Abdomen is soft.     Tenderness: There is no abdominal tenderness. There is no guarding.  Musculoskeletal:     Right lower leg: No edema.     Left lower leg: No edema.  Lymphadenopathy:     Cervical: No cervical adenopathy.  Skin:    General: Skin is warm and dry.  Neurological:     Mental Status: She is alert.     Comments: Patient is alert and oriented. Grip strength weaker on the left. Upon my exam, strength in the left arm left leg are 4/5. Strength 5/5 on the right. Left-sided facial droop. Some slurred speech.  Psychiatric:        Mood and Affect: Mood and affect normal.        Speech: Speech normal.        Behavior: Behavior normal.      ED Treatments / Results   Labs (all labs ordered are listed, but only abnormal results are displayed) Labs Reviewed  PROTIME-INR - Abnormal; Notable for the following components:      Result Value   Prothrombin Time 21.9 (*)    INR 1.9 (*)    All other components within normal limits  APTT - Abnormal; Notable for the following components:   aPTT 39 (*)    All other components within normal limits  CBC - Abnormal; Notable for the following components:   WBC 3.7 (*)    RBC 3.20 (*)    Hemoglobin 9.4 (*)    HCT 29.9 (*)    RDW 26.9 (*)    Platelets 143 (*)    nRBC 0.5 (*)    All other components within normal limits  COMPREHENSIVE METABOLIC PANEL - Abnormal; Notable for the following components:   CO2 20 (*)    BUN 41 (*)    Creatinine, Ser 1.38 (*)    Albumin 3.3 (*)    AST 98 (*)    Alkaline Phosphatase 129 (*)    GFR calc non Af Amer 37 (*)    GFR calc Af Amer 43 (*)    All other components within normal limits  I-STAT CHEM 8, ED - Abnormal; Notable for the following components:   BUN 46 (*)    Creatinine, Ser 1.40 (*)    Hemoglobin 11.2 (*)  HCT 33.0 (*)    All other components within normal limits  CBG MONITORING, ED - Abnormal; Notable for the following components:   Glucose-Capillary 113 (*)    All other components within normal limits  SARS CORONAVIRUS 2 (HOSPITAL ORDER, [place] LAB)  DIFFERENTIAL  ETHANOL  RAPID URINE DRUG SCREEN, HOSP PERFORMED  URINALYSIS, ROUTINE W REFLEX MICROSCOPIC   BUN  Date Value Ref Range Status  02/17/2019 46 (H) 8 - 23 mg/dL Final  02/17/2019 41 (H) 8 - 23 mg/dL Final  02/07/2019 41 (H) 8 - 23 mg/dL Final  02/06/2019 41 (H) 8 - 23 mg/dL Final  01/25/2019 38 (H) 8 - 27 mg/dL Final  06/06/2018 33 (H) 8 - 27 mg/dL Final  05/04/2018 31 (H) 8 - 27 mg/dL Final  04/27/2018 30 (H) 8 - 27 mg/dL Final  12/03/2014 18.7 7.0 - 26.0 mg/dL Final   BUN, Bld  Date Value Ref Range Status  03/14/2010 14 7 - 22 mg/dL Final  08/19/2009 18 7  - 22 mg/dL Final  07/09/2009 16 7 - 22 mg/dL Final   Creatinine  Date Value Ref Range Status  12/03/2014 0.9 0.6 - 1.1 mg/dL Final   Creat  Date Value Ref Range Status  10/27/2016 0.95 (H) 0.60 - 0.93 mg/dL Final    Comment:      For patients > or = 77 years of age: The upper reference limit for Creatinine is approximately 13% higher for people identified as African-American.     09/02/2016 1.01 (H) 0.60 - 0.93 mg/dL Final    Comment:      For patients > or = 77 years of age: The upper reference limit for Creatinine is approximately 13% higher for people identified as African-American.     07/18/2014 0.94 0.50 - 1.10 mg/dL Final  10/28/2011 0.63 0.50 - 1.10 mg/dL Final   Creatinine, Ser  Date Value Ref Range Status  02/17/2019 1.40 (H) 0.44 - 1.00 mg/dL Final  02/17/2019 1.38 (H) 0.44 - 1.00 mg/dL Final  02/07/2019 1.09 (H) 0.44 - 1.00 mg/dL Final  02/06/2019 1.55 (H) 0.44 - 1.00 mg/dL Final   Hemoglobin  Date Value Ref Range Status  02/17/2019 11.2 (L) 12.0 - 15.0 g/dL Final  02/17/2019 9.4 (L) 12.0 - 15.0 g/dL Final  02/07/2019 8.8 (L) 12.0 - 15.0 g/dL Final  02/06/2019 9.5 (L) 12.0 - 15.0 g/dL Final   HGB  Date Value Ref Range Status  12/17/2016 9.6 (L) 11.6 - 15.9 g/dL Final  12/09/2015 11.7 11.6 - 15.9 g/dL Final  12/03/2014 14.4 11.6 - 15.9 g/dL Final  11/30/2013 13.7 11.6 - 15.9 g/dL Final    EKG None  Radiology Ct Angio Head W Or Wo Contrast  Result Date: 02/17/2019 CLINICAL DATA:  Code stroke.  Left-sided weakness EXAM: CT ANGIOGRAPHY HEAD AND NECK TECHNIQUE: Multidetector CT imaging of the head and neck was performed using the standard protocol during bolus administration of intravenous contrast. Multiplanar CT image reconstructions and MIPs were obtained to evaluate the vascular anatomy. Carotid stenosis measurements (when applicable) are obtained utilizing NASCET criteria, using the distal internal carotid diameter as the denominator. CONTRAST:   32m OMNIPAQUE IOHEXOL 350 MG/ML SOLN COMPARISON:  02/17/2019 FINDINGS: CTA NECK FINDINGS Aortic arch: Mild atherosclerotic disease in the aortic arch without acute abnormality. Mild atherosclerotic disease proximal great vessels which are widely patent. Right carotid system: Right carotid widely patent with minimal atherosclerotic disease at the bifurcation. Left carotid system: Left carotid widely patent with  minimal atherosclerotic disease at the bifurcation. Vertebral arteries: Both vertebral arteries widely patent to the basilar. Mild stenosis at the origin of the right vertebral artery. Skeleton: No acute abnormality. Other neck: Negative for mass or adenopathy in the neck. Chronic diffuse dilatation of the jugular veins bilaterally right greater than left as seen on prior CT chest 03/30/2018. SVC was not occluded on the prior study. There is a left-sided pacemaker. The left innominate is mildly narrowed but not significantly so. Marked enlargement of the superior ophthalmic vein bilaterally likely is due to elevated venous pressures. Upper chest: Mild patchy airspace disease in the lung apices bilaterally. Possible edema or pneumonia. Review of the MIP images confirms the above findings CTA HEAD FINDINGS Anterior circulation: Atherosclerotic calcification in the cavernous carotid bilaterally not causing significant stenosis. Anterior cerebral arteries patent bilaterally. Left middle cerebral artery widely patent. Occlusion right M1 segment. There is delayed filling of right M2 branches due to collateral circulation. Posterior circulation: Both vertebral arteries patent to the basilar. PICA patent bilaterally. Basilar widely patent. Superior cerebellar and posterior cerebral arteries patent bilaterally. Venous sinuses: Marked enlargement of the superior ophthalmic vein bilaterally. This may be partially thrombosed on the left but not the right. No evidence of carotid cavernous fistula. Superior sagittal sinus  patent. Transverse sinus patent bilaterally. Anatomic variants: None Delayed phase: Not performed Review of the MIP images confirms the above findings IMPRESSION: 1. Occlusion right M1 segment which appears acute. Delayed collateral flow to right M2 branches. 2. No significant carotid or vertebral artery stenosis in the neck. 3. Marked enlargement of the superior ophthalmic vein bilaterally. No evidence of carotid cavernous fistula. Marked dilatation of the jugular veins bilaterally is chronic and likely due to elevated right heart pressures. The patient has history of cardiac valvular disease. 4. Results were texted to Dr. Cheral Marker at the time of the report. Electronically Signed   By: Franchot Gallo M.D.   On: 02/17/2019 12:01   Ct Angio Neck W Or Wo Contrast  Result Date: 02/17/2019 CLINICAL DATA:  Code stroke.  Left-sided weakness EXAM: CT ANGIOGRAPHY HEAD AND NECK TECHNIQUE: Multidetector CT imaging of the head and neck was performed using the standard protocol during bolus administration of intravenous contrast. Multiplanar CT image reconstructions and MIPs were obtained to evaluate the vascular anatomy. Carotid stenosis measurements (when applicable) are obtained utilizing NASCET criteria, using the distal internal carotid diameter as the denominator. CONTRAST:  3m OMNIPAQUE IOHEXOL 350 MG/ML SOLN COMPARISON:  02/17/2019 FINDINGS: CTA NECK FINDINGS Aortic arch: Mild atherosclerotic disease in the aortic arch without acute abnormality. Mild atherosclerotic disease proximal great vessels which are widely patent. Right carotid system: Right carotid widely patent with minimal atherosclerotic disease at the bifurcation. Left carotid system: Left carotid widely patent with minimal atherosclerotic disease at the bifurcation. Vertebral arteries: Both vertebral arteries widely patent to the basilar. Mild stenosis at the origin of the right vertebral artery. Skeleton: No acute abnormality. Other neck: Negative for  mass or adenopathy in the neck. Chronic diffuse dilatation of the jugular veins bilaterally right greater than left as seen on prior CT chest 03/30/2018. SVC was not occluded on the prior study. There is a left-sided pacemaker. The left innominate is mildly narrowed but not significantly so. Marked enlargement of the superior ophthalmic vein bilaterally likely is due to elevated venous pressures. Upper chest: Mild patchy airspace disease in the lung apices bilaterally. Possible edema or pneumonia. Review of the MIP images confirms the above findings CTA HEAD FINDINGS Anterior circulation:  Atherosclerotic calcification in the cavernous carotid bilaterally not causing significant stenosis. Anterior cerebral arteries patent bilaterally. Left middle cerebral artery widely patent. Occlusion right M1 segment. There is delayed filling of right M2 branches due to collateral circulation. Posterior circulation: Both vertebral arteries patent to the basilar. PICA patent bilaterally. Basilar widely patent. Superior cerebellar and posterior cerebral arteries patent bilaterally. Venous sinuses: Marked enlargement of the superior ophthalmic vein bilaterally. This may be partially thrombosed on the left but not the right. No evidence of carotid cavernous fistula. Superior sagittal sinus patent. Transverse sinus patent bilaterally. Anatomic variants: None Delayed phase: Not performed Review of the MIP images confirms the above findings IMPRESSION: 1. Occlusion right M1 segment which appears acute. Delayed collateral flow to right M2 branches. 2. No significant carotid or vertebral artery stenosis in the neck. 3. Marked enlargement of the superior ophthalmic vein bilaterally. No evidence of carotid cavernous fistula. Marked dilatation of the jugular veins bilaterally is chronic and likely due to elevated right heart pressures. The patient has history of cardiac valvular disease. 4. Results were texted to Dr. Cheral Marker at the time of  the report. Electronically Signed   By: Franchot Gallo M.D.   On: 02/17/2019 12:01   Ct Head Code Stroke Wo Contrast`  Result Date: 02/17/2019 CLINICAL DATA:  Code stroke.  Left-sided weakness. EXAM: CT HEAD WITHOUT CONTRAST TECHNIQUE: Contiguous axial images were obtained from the base of the skull through the vertex without intravenous contrast. COMPARISON:  CT head 04/01/2018 FINDINGS: Brain: Image quality degraded by motion Moderate atrophy. Negative for hydrocephalus. Negative for acute hemorrhage or mass Patchy hypodensity in the white matter bilaterally including the anterior basal ganglia bilaterally is stable. No area of acute infarct or edema. Chronic ischemic changes in the cerebellum bilaterally are stable. Vascular: Negative for hyperdense vessel Skull: Negative Sinuses/Orbits: Paranasal sinuses clear. Bilateral cataract surgery. Other: None ASPECTS ([place] Stroke Program Early CT Score) - Ganglionic level infarction (caudate, lentiform nuclei, internal capsule, insula, M1-M3 cortex): 7 - Supraganglionic infarction (M4-M6 cortex): 3 Total score (0-10 with 10 being normal): 10 IMPRESSION: 1. No acute intracranial abnormality 2. ASPECTS is 10 3. These results were called by telephone at the time of interpretation on 02/17/2019 at 11:39 am to Dr. Kerney Elbe , who verbally acknowledged these results. Electronically Signed   By: Franchot Gallo M.D.   On: 02/17/2019 11:39    Procedures .Critical Care Performed by: Lorayne Bender, PA-C Authorized by: Lorayne Bender, PA-C   Critical care provider statement:    Critical care time (minutes):  35   Critical care time was exclusive of:  Separately billable procedures and treating other patients   Critical care was necessary to treat or prevent imminent or life-threatening deterioration of the following conditions:  CNS failure or compromise   Critical care was time spent personally by me on the following activities:  Development of treatment plan with  patient or surrogate, discussions with consultants, examination of patient, obtaining history from patient or surrogate, ordering and performing treatments and interventions, ordering and review of laboratory studies, ordering and review of radiographic studies, pulse oximetry and re-evaluation of patient's condition   I assumed direction of critical care for this patient from another provider in my specialty: no   Procedure Name: Intubation Date/Time: 02/17/2019 12:09 PM Performed by: Lorayne Bender, PA-C Pre-anesthesia Checklist: Patient identified, Emergency Drugs available, Suction available, Patient being monitored and Timeout performed Oxygen Delivery Method: Nasal cannula Preoxygenation: Pre-oxygenation with 100% oxygen Induction Type: Rapid  sequence Laryngoscope Size: Mac and 4 Number of attempts: 1 Airway Equipment and Method: Stylet Placement Confirmation: ETT inserted through vocal cords under direct vision,  Breath sounds checked- equal and bilateral and CO2 detector Secured at: 23 cm Tube secured with: ETT holder Dental Injury: Teeth and Oropharynx as per pre-operative assessment       (including critical care time)  Medications Ordered in ED Medications  tirofiban (AGGRASTAT) 5-0.9 MG/100ML-% injection (has no administration in time range)  ticagrelor (BRILINTA) 90 MG tablet (has no administration in time range)  aspirin 325 MG tablet (has no administration in time range)  clopidogrel (PLAVIX) 300 MG tablet (has no administration in time range)  lidocaine (XYLOCAINE) 1 % (with pres) injection (has no administration in time range)  nitroGLYCERIN 100 mcg/mL intra-arterial injection (has no administration in time range)  eptifibatide (INTEGRILIN) 20 MG/10ML injection (has no administration in time range)  propofol (DIPRIVAN) 1000 MG/100ML infusion (25 mcg/kg/min  Canceled Entry 02/17/19 1213)  etomidate (AMIDATE) injection (20 mg Intravenous Given 02/17/19 1205)  rocuronium  (ZEMURON) injection (40 mg Intravenous Given 02/17/19 1208)  fentaNYL (SUBLIMAZE) 100 MCG/2ML injection (has no administration in time range)  ceFAZolin (ANCEF) 2-4 GM/100ML-% IVPB (has no administration in time range)  metoprolol tartrate (LOPRESSOR) 5 MG/5ML injection (has no administration in time range)  diltiazem (CARDIZEM) 100 mg in dextrose 5% 127m (1 mg/mL) infusion (has no administration in time range)  propofol (DIPRIVAN) 1000 MG/100ML infusion (35 mcg/kg/min  59 kg Intravenous Rate/Dose Change 02/17/19 1217)  iohexol (OMNIPAQUE) 350 MG/ML injection 75 mL (75 mLs Intravenous Contrast Given 02/17/19 1135)     Initial Impression / Assessment and Plan / ED Course  I have reviewed the triage vital signs and the nursing notes.  Pertinent labs & imaging results that were available during my care of the patient were reviewed by me and considered in my medical decision making (see chart for details).        Patient presents with left-sided facial droop and extremity weakness.  Code stroke activated prior to my interaction with the patient.  Neurology team, under the direction of Dr. LCheral Marker evaluated patient and assisted with coordinating care from the time of her arrival. Patient found to have right MCA occlusion, which would clinically correlate with her symptoms.  She was determined not to be a TPA candidate due to her INR of 1.9.  However, she was determined to be an IR candidate.  Intubation was performed to facilitate this. Intubation without episodes of hypoxia.  However, she did have an episode of tachycardia post intubation requiring Lopressor push, which stabilized the patient's heart rate.  She was then placed on a Cardizem drip continued heart rate control. Patient to be admitted through neurology service.   Findings and plan of care discussed with BNoemi Chapel MD. Dr. MSabra Heckpersonally evaluated and examined this patient.   Final Clinical Impressions(s) / ED Diagnoses    Final diagnoses:  Acute right MCA stroke ([facility]    ED Discharge Orders    None       JLayla Maw05/29/20 1239    MNoemi Chapel MD 02/18/19 1047

## 2019-02-17 NOTE — ED Triage Notes (Signed)
Pt from home witnessed fall by family. Pt normally walks with cane and has independent ADLs

## 2019-02-17 NOTE — Procedures (Signed)
S/P RT common carotid  Arteriogram followed by complete revascularization of Rt MCA M 1 occlusion with x 1 pass with  69mm  X 27mm embotrap and penumbra aspiration catheter achieving a TICI 3 revascularization.

## 2019-02-17 NOTE — Progress Notes (Signed)
Patient arrive to 4NICU approximately 1430.  Groin site stable and hemostasis achieved at 1350 per OR RN.  First vitals on unit at 1438.  CCM at bedside to assess patient.  RN will continue to monitor.

## 2019-02-17 NOTE — Consult Note (Addendum)
NAME:  Carrie [name], MRN:  [id number], DOB:  [birth year], LOS: 0 ADMISSION DATE:  02/17/2019, CONSULTATION DATE:  5/29 REFERRING MD: Dr. Estanislado Pandy, CHIEF COMPLAINT:  AMS   Brief History   [name] who presented to [facility] 5/29 with left sided weakness, facial droop and slurred speech.  Work up consistent with large vessel occlusion and patient taken emergently to neuro IR for revascularization.  History of present illness   [name] who presented to [facility] 5/29 with left sided weakness, facial droop and slurred speech. The patient was evaluated for CVA with CT Head which revealed no acute abnormality.  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  INR was elevated and she was not a candidate for tPA.  The patient was taken to interventional radiology per Dr. 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  The patient was returned to ICU on mechanical ventilation.    PCCM consulted for assistance with post-operative ICU management.   Past Medical History  SSS s/p Pacemker Severe TR  Rheumatic Mitral Valve Disease  HTN  HLD NSTEMI  CHF - chronic combined  CAD  AF - on anticoagulation  SDH   Significant Hospital Events   5/29 Admit with R M1 occlusion   Consults:  PCCM   Procedures:  ETT 5/29 >>   Significant Diagnostic Tests:  5/29 CT Head CODE Stroke >> no acute abnormality  5/29 CTA Head / Neck >> acute occlusion of the right M1 segment, delayed collateral flow to the R M2 branches.  Marked dilatation of the jugular veins bilaterally likely due to elevated right heart pressures.   Micro Data:  COVD 5/29 >> negative   Antimicrobials:    Interim history/subjective:  As above   Objective   Blood pressure (!) 138/94, pulse 64, temperature 98.2 F (36.8 C), temperature source Oral, resp. rate 18, weight 59 kg, SpO2 100 %.     Vent Mode: PRVC FiO2 (%):  [100 %] 100 % Set Rate:  [16 bmp] 16 bmp Vt Set:  [380 mL] 380 mL PEEP:  [5 cmH20] 5 cmH20 Plateau Pressure:  [20 cmH20] 20 cmH20   Intake/Output Summary (Last 24 hours) at 02/17/2019 1439 Last data filed at 02/17/2019 1400 Gross per 24 hour  Intake -  Output 375 ml  Net -375 ml   Filed Weights   02/17/19 1100  Weight: 59 kg    Examination: General: elderly female lying in bed on vent in NAD HEENT: MM pink/moist, ETT, pupils 19mm =/R bilaterally Neuro: sedate CV: s1s2 rrr, no m/r/g, mitral valve click (hx replacement), midline sternotomy scar PULM: even/non-labored, lungs bilaterally coarse  VO:HYWV, non-tender, bsx4 active  Extremities: warm/dry, trace to 1+ BLE edema  Skin: no rashes or lesions  Resolved Hospital Problem list      Assessment & Plan:    Right M1 Occlusion / CVA -s/p revascularization of right common carotid  Hx SDH  P: SBP goal 120-140  Neosynephrine for MAP goal as above Further neuro imaging per Stroke Service  PAD protocol for sedation, RASS Goal 0 to -1   Acute Respiratory Insufficiency requiring Mechanical Ventilation  -in setting of CVA P: PRVC 8cc/kg  Wean PEEP / FiO2 for sats > 90% Hx of elevated right heart pressures may prove to be difficult to wean from vent  Daily SBT / WUA  ABG in one hour  CXR to confirm ETT   AKI  P: Trend BMP / urinary output Replace electrolytes as indicated Avoid nephrotoxic agents, ensure adequate renal perfusion  Chronic Combined CHF  SSS s/p Pacemaker  Hx Bioprosthetic MV AF - on coumadin P: Restart diuretics in am  Hold coumadin  EKG now  Defer ASA decision to Neurology  Continue lopressor for rhythm stabilization  Lipitor   Anemia P: Trend CBC  Transfuse per ICU guidelines   Hypothermia  P: Bair hugger  Assess TSH, cortisol  Best practice:  Diet: NPO  Pain/Anxiety/Delirium protocol (if indicated): PAD - propofol + PRN fentanyl  VAP protocol (if  indicated): In place  DVT prophylaxis: SCD's  GI prophylaxis: Pepcid  Glucose control: n/a  Mobility: bed rest  Code Status: Full Code  Family Communication: Updated per primary MD Disposition: ICU   Labs   CBC: Recent Labs  Lab 02/17/19 1132 02/17/19 1148  WBC 3.7*  --   NEUTROABS 2.3  --   HGB 9.4* 11.2*  HCT 29.9* 33.0*  MCV 93.4  --   PLT 143*  --     Basic Metabolic Panel: Recent Labs  Lab 02/17/19 1132 02/17/19 1148  NA 137 140  K 4.9 4.9  CL 107 109  CO2 20*  --   GLUCOSE 94 89  BUN 41* 46*  CREATININE 1.38* 1.40*  CALCIUM 9.6  --    GFR: Estimated Creatinine Clearance: 27.8 mL/min (A) (by C-G formula based on SCr of 1.4 mg/dL (H)). Recent Labs  Lab 02/17/19 1132  WBC 3.7*    Liver Function Tests: Recent Labs  Lab 02/17/19 1132  AST 98*  ALT 23  ALKPHOS 129*  BILITOT 1.2  PROT 8.0  ALBUMIN 3.3*   No results for input(s): LIPASE, AMYLASE in the last 168 hours. No results for input(s): AMMONIA in the last 168 hours.  ABG    Component Value Date/Time   PHART 7.463 (H) 09/06/2011 1403   PCO2ART 31.0 (L) 09/06/2011 1403   PO2ART 74.2 (L) 09/06/2011 1403   HCO3 30.8 (H) 02/06/2019 1252   TCO2 23 02/17/2019 1148   ACIDBASEDEF 0.4 09/06/2011 1403   O2SAT 76.0 02/06/2019 1252     Coagulation Profile: Recent Labs  Lab 02/14/19 1306 02/17/19 1132  INR 1.6* 1.9*    Cardiac Enzymes: No results for input(s): CKTOTAL, CKMB, CKMBINDEX, TROPONINI in the last 168 hours.  HbA1C: Hgb A1c MFr Bld  Date/Time Value Ref Range Status  03/31/2018 01:12 AM 4.7 (L) 4.8 - 5.6 % Final    Comment:    (NOTE) Pre diabetes:          5.7%-6.4% Diabetes:              >6.4% Glycemic control for   <7.0% adults with diabetes   03/15/2013 04:10 PM 5.5 <5.7 % Final    Comment:    (NOTE)                                                                       According to the ADA Clinical Practice Recommendations for 2011, when HbA1c is used as a screening  test:  >=6.5%   Diagnostic of Diabetes Mellitus           (  if abnormal result is confirmed) 5.7-6.4%   Increased risk of developing Diabetes Mellitus References:Diagnosis and Classification of Diabetes Mellitus,Diabetes GQQP,6195,09(TOIZT 1):S62-S69 and Standards of Medical Care in         Diabetes - 2011,Diabetes IWPY,0998,33 (Suppl 1):S11-S61.    CBG: Recent Labs  Lab 02/17/19 1225  GLUCAP 113*    Review of Systems:   Unable to complete as patient is altered on vent.   Past Medical History  She,  has a past medical history of Anticoagulated on Coumadin, Anxiety, Arthritis, Atrial fibrillation ([place]), Atrial flutter ([place]), CAD (coronary artery disease) (08/11/2016), CHF (congestive heart failure) ([place]), Chronic combined systolic and diastolic CHF (congestive heart failure) ([place]), Diverticula of colon, Exertional shortness of breath, GERD (gastroesophageal reflux disease), Heart murmur, Hemorrhage intraabdominal (03/17/2012), History of blood transfusion, Hyperlipidemia, Hypertension, Liver hemorrhage, Migraines, Mitral valve regurgitation, 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   Surgical History    Past Surgical History:  Procedure Laterality Date  . APPENDECTOMY    . CARDIAC CATHETERIZATION  04/02/97   R&L:severe MR/pulmonary hypertension  . CARDIAC CATHETERIZATION N/A 03/16/2016   Procedure: Left Heart Cath and Coronary Angiography;  Surgeon: [name], MD;  Location: [place] CV LAB;  Service: Cardiovascular;  Laterality: N/A;  . CARDIAC CATHETERIZATION N/A 09/30/2016   Procedure: Left Heart Cath and Coronary Angiography;  Surgeon: [name], MD;  Location: [place] CV LAB;  Service: Cardiovascular;  Laterality: N/A;  . CARDIAC CATHETERIZATION N/A 09/30/2016   Procedure: Coronary Stent  Intervention;  Surgeon: [name], MD;  3.5 x 12 mm resolute Onyx  to ostial RCA  . CATARACT EXTRACTION W/ INTRAOCULAR LENS  IMPLANT, BILATERAL Bilateral 2013  . CORONARY ANGIOPLASTY    . DILATION AND CURETTAGE OF UTERUS     "had fibroids" (03/15/2013)  . EXPLORATORY LAPAROTOMY     "had a growth on my intestines" (03/15/2013)  . HAMMER TOE SURGERY Right   . INSERT / REPLACE / REMOVE PACEMAKER  12/15/2006   Medtronic  . MITRAL VALVE REPLACEMENT  1998   [employer] Jude mechanical; Dr. [name]  . PACEMAKER PLACEMENT  12/15/06   medtronic adapta for SSS  . PERMANENT PACEMAKER GENERATOR CHANGE N/A 07/24/2014   Procedure: PERMANENT PACEMAKER GENERATOR CHANGE;  Surgeon: [name], MD;  Location: [place] CATH LAB;  Service: Cardiovascular;  Laterality: N/A;  . PERSANTINE CARDIOLITE  08/07/03   mild inf. ischemia   . RIGHT HEART CATH N/A 02/06/2019   Procedure: RIGHT HEART CATH;  Surgeon: [name], MD;  Location: [place] CV LAB;  Service: Cardiovascular;  Laterality: N/A;  . TEE WITHOUT CARDIOVERSION  09/23/2011   Procedure: TRANSESOPHAGEAL ECHOCARDIOGRAM (TEE);  Surgeon: [name];  Location: [facility];  Service: Cardiovascular;  Laterality: N/A;  . TONSILLECTOMY    . TUBAL LIGATION    . US ECHOCARDIOGRAPHY  11/19/2011   EF 50-55%,RA mod to severely dilated,LA severely dilated,trace MR,small vegetation or mass on the MV,AOV mildly scleroticmild PI, RV pressure 40-49mmHg     Social History   reports that she has never smoked. She has never used smokeless tobacco. She reports that she does not drink alcohol or use drugs.   Family History   Her family history includes Cancer in her mother and sister; Diabetes in her sister; Healthy in her brother, brother, brother, and sister; Heart attack in her maternal grandfather; Kidney disease in her daughter;  Leukemia in her sister; Stroke in her brother and father.   Allergies Allergies  Allergen Reactions  . Codeine Nausea And  Vomiting     Home Medications  Prior to Admission medications   Medication Sig Start Date End Date Taking? Authorizing Provider  atorvastatin (LIPITOR) 40 MG tablet TAKE 1 TABLET BY MOUTH EVERY DAY AT 6PM Patient taking differently: Take 40 mg by mouth daily at 6 PM.  09/27/18   Croitoru, Mihai, MD  Biotin 1000 MCG tablet Take 1,000 mcg by mouth daily.    [provider]  colchicine 0.6 MG tablet Take 1 tablet (0.6 mg total) by mouth daily for 4 days. 02/07/19 02/11/19  Kayleen Memos, DO  dextromethorphan-guaiFENesin (MUCINEX DM) 30-600 MG 12hr tablet Take 1 tablet by mouth 2 (two) times daily as needed for cough. 04/01/18   Hosie Poisson, MD  docusate sodium (COLACE) 100 MG capsule Take 200 mg by mouth 2 (two) times daily.     [provider]  furosemide (LASIX) 80 MG tablet Take 1 tablet (80 mg total) by mouth 2 (two) times daily. 02/07/19   Kayleen Memos, DO  metolazone (ZAROXOLYN) 2.5 MG tablet Take 2.5 mg when your weight is above 137 lbs as recommended by cardiology. 02/07/19   Kayleen Memos, DO  metoprolol tartrate (LOPRESSOR) 25 MG tablet Take 1 tablet (25 mg total) by mouth 2 (two) times daily. 11/10/16   Croitoru, Mihai, MD  Multiple Vitamin (MULTIVITAMINS PO) Take 1 tablet by mouth daily.     [provider]  nitroGLYCERIN (NITROSTAT) 0.4 MG SL tablet Place 1 tablet (0.4 mg total) under the tongue every 5 (five) minutes as needed for chest pain. 11/20/16   Croitoru, Mihai, MD  Omega-3 Fatty Acids (FISH OIL) 1000 MG CAPS Take 1,000 mg by mouth daily.    [provider]  oxymetazoline (AFRIN) 0.05 % nasal spray Place 1 spray into both nostrils 2 (two) times daily as needed for congestion. 02/07/19   Kayleen Memos, DO  polyethylene glycol (MIRALAX / GLYCOLAX) packet Take 17 g by mouth as needed (for constipation). Reported on 03/25/2016    [provider]  polyvinyl alcohol (LIQUIFILM TEARS) 1.4 % ophthalmic solution Place 1 drop into the left eye as  needed for dry eyes. 02/07/19   Kayleen Memos, DO  potassium chloride SA (K-DUR,KLOR-CON) 20 MEQ tablet Take 2 tablets (40 mEq total) by mouth every morning AND 1 tablet (20 mEq total) every evening. 06/01/18   Croitoru, Mihai, MD  spironolactone (ALDACTONE) 25 MG tablet Take 0.5 tablets (12.5 mg total) by mouth daily. 02/08/19   Kayleen Memos, DO  warfarin (COUMADIN) 5 MG tablet TAKE 1 TO 1 AND 1/2 TABLETS BY MOUTH DAILY AS DIRECTED Patient taking differently: Take 2.5-5 mg by mouth See admin instructions. Taking 1/2 tablet (2.5) mg on Monday and Thursday. All other days 5mg  tablet 12/15/18   Croitoru, Dani Gobble, MD     Critical care time: 35 minutes      Noe Gens, NP-C [facility] Pgr: [phone] or if no answer [phone] 02/17/2019, 2:39 PM

## 2019-02-17 NOTE — Progress Notes (Signed)
Pt belongings: coral colored towel, cut light blue tshirt, cut gray tshirt. No jewelry, electronics, wallet or other valuables.

## 2019-02-17 NOTE — Anesthesia Postprocedure Evaluation (Signed)
Anesthesia Post Note  Patient: Carrie [name]  Procedure(s) Performed: IR WITH ANESTHESIA (N/A )     Patient location during evaluation: ICU Anesthesia Type: General Level of consciousness: sedated Pain management: pain level controlled Vital Signs Assessment: post-procedure vital signs reviewed and stable Respiratory status: patient remains intubated per anesthesia plan Cardiovascular status: stable Postop Assessment: no apparent nausea or vomiting Anesthetic complications: no    Last Vitals:  Vitals:   02/17/19 1500 02/17/19 1524  BP: 137/77 137/77  Pulse: 77 60  Resp: (!) 22 (!) 23  Temp: (!) 34.7 C   SpO2: 100%     Last Pain:  Vitals:   02/17/19 1500  TempSrc: Rectal                 Audry Pili

## 2019-02-17 NOTE — ED Provider Notes (Signed)
MSE was initiated and I personally evaluated the patient and placed orders (if any) at  11:23 AM on Feb 17, 2019.  The patient appears stable so that the remainder of the MSE may be completed by another provider.  Arrives as code stroke with time of onset witnessed this morning 1015 with dense left-sided weakness.  Per EMS patient had LVO criteria.  Patient is anticoagulated.  Patient is awake answering questions.  She does have evident left facial droop.  Airway is patent.  She is not having any sonorous respirations.  Stable for CT scan and ongoing management by neurology   Charlesetta Shanks, MD 02/17/19 1125

## 2019-02-17 NOTE — Transfer of Care (Signed)
Immediate Anesthesia Transfer of Care Note  Patient: Carrie [name]  Procedure(s) Performed: IR WITH ANESTHESIA (N/A )  Patient Location: NICU  Anesthesia Type:General  Level of Consciousness: Patient remains intubated per anesthesia plan  Airway & Oxygen Therapy: Patient remains intubated per anesthesia plan and Patient placed on Ventilator (see vital sign flow sheet for setting)  Post-op Assessment: Report given to RN and Post -op Vital signs reviewed and stable  Post vital signs: Reviewed and stable  Last Vitals:  Vitals Value Taken Time  BP 137/77 02/17/2019  3:00 PM  Temp    Pulse 67 02/17/2019  3:04 PM  Resp 23 02/17/2019  3:04 PM  SpO2 100 % 02/17/2019  3:04 PM  Vitals shown include unvalidated device data.  Last Pain:  Vitals:   02/17/19 1226  TempSrc: Oral         Complications: No apparent anesthesia complications

## 2019-02-17 NOTE — ED Notes (Signed)
Stoke labs sent to main lab

## 2019-02-17 NOTE — ED Provider Notes (Signed)
Medical screening examination/treatment/procedure(s) were conducted as a shared visit with non-physician practitioner(s) and myself.  I personally evaluated the patient during the encounter.  Clinical Impression:   Final diagnoses:  Acute right MCA stroke ([place])    The pt has acute onset of L sided facial droop - with unilateral weakness - speech intact - LSN 10:15 AM - in scanner - has abnormal CT confirming pathology - needs intubation and IR per dr. Yvetta Coder request.  Pt INR > 1.7, not a candidate for TPA.  I was present for intubation - went smooth - pt intubated on 1st attempt by Mr. Caryl Asp - see separate note.  Pt went into rapid afib during intubation - was given metoprolol and then a cardizem drop pt to go to IR  The pt has only 1 IV access - I placed a L EJ - 20 gauge on one attempt.  Angiocath insertion Performed by: Johnna Acosta  Consent: Verbal consent obtained. Risks and benefits: risks, benefits and alternatives were discussed Time out: Immediately prior to procedure a "time out" was called to verify the correct patient, procedure, equipment, support staff and site/side marked as required.  Preparation: Patient was prepped and draped in the usual sterile fashion.  Vein Location: L IJ  Not Ultrasound Guided  Gauge: 20  Normal blood return and flush without difficulty Patient tolerance: Patient tolerated the procedure well with no immediate complications.  I was present for the sedation of this patient by physician assistant Mechele Claude.  The intubation went well, the patient was able to go to interventional radiology   .Critical Care Performed by: Noemi Chapel, MD Authorized by: Noemi Chapel, MD   Critical care provider statement:    Critical care time (minutes):  35   Critical care time was exclusive of:  Separately billable procedures and treating other patients and teaching time   Critical care was necessary to treat or prevent imminent or life-threatening  deterioration of the following conditions:  CNS failure or compromise   Critical care was time spent personally by me on the following activities:  Blood draw for specimens, development of treatment plan with patient or surrogate, discussions with consultants, evaluation of patient's response to treatment, examination of patient, obtaining history from patient or surrogate, ordering and performing treatments and interventions, ordering and review of laboratory studies, ordering and review of radiographic studies, pulse oximetry, re-evaluation of patient's condition and review of old charts      Noemi Chapel, MD 02/18/19 1046

## 2019-02-17 NOTE — Sedation Documentation (Signed)
Report given to TK.

## 2019-02-18 ENCOUNTER — Inpatient Hospital Stay (HOSPITAL_COMMUNITY): Payer: Medicare Other

## 2019-02-18 DIAGNOSIS — D62 Acute posthemorrhagic anemia: Secondary | ICD-10-CM

## 2019-02-18 DIAGNOSIS — Z950 Presence of cardiac pacemaker: Secondary | ICD-10-CM

## 2019-02-18 DIAGNOSIS — J9600 Acute respiratory failure, unspecified whether with hypoxia or hypercapnia: Secondary | ICD-10-CM | POA: Diagnosis not present

## 2019-02-18 DIAGNOSIS — I959 Hypotension, unspecified: Secondary | ICD-10-CM

## 2019-02-18 LAB — HEMOGLOBIN AND HEMATOCRIT, BLOOD
HCT: 26 % — ABNORMAL LOW (ref 36.0–46.0)
Hemoglobin: 8.8 g/dL — ABNORMAL LOW (ref 12.0–15.0)

## 2019-02-18 LAB — LIPID PANEL
Cholesterol: 81 mg/dL (ref 0–200)
HDL: 28 mg/dL — ABNORMAL LOW (ref >40)
LDL Cholesterol: 41 mg/dL (ref 0–99)
Total CHOL/HDL Ratio: 2.9 RATIO
Triglycerides: 59 mg/dL (ref <150)
VLDL: 12 mg/dL (ref 0–40)

## 2019-02-18 LAB — GLUCOSE, CAPILLARY
Glucose-Capillary: 80 mg/dL (ref 70–99)
Glucose-Capillary: 83 mg/dL (ref 70–99)
Glucose-Capillary: 89 mg/dL (ref 70–99)
Glucose-Capillary: 92 mg/dL (ref 70–99)
Glucose-Capillary: 86 mg/dL (ref 70–99)
Glucose-Capillary: 97 mg/dL (ref 70–99)

## 2019-02-18 LAB — BASIC METABOLIC PANEL
Anion gap: 12 (ref 5–15)
BUN: 30 mg/dL — ABNORMAL HIGH (ref 8–23)
CO2: 18 mmol/L — ABNORMAL LOW (ref 22–32)
Calcium: 9 mg/dL (ref 8.9–10.3)
Chloride: 109 mmol/L (ref 98–111)
Creatinine, Ser: 1.29 mg/dL — ABNORMAL HIGH (ref 0.44–1.00)
GFR calc Af Amer: 46 mL/min — ABNORMAL LOW (ref >60)
GFR calc non Af Amer: 40 mL/min — ABNORMAL LOW (ref >60)
Glucose, Bld: 84 mg/dL (ref 70–99)
Potassium: 3.5 mmol/L (ref 3.5–5.1)
Sodium: 139 mmol/L (ref 135–145)

## 2019-02-18 LAB — CBC WITH DIFFERENTIAL/PLATELET
Abs Immature Granulocytes: 0.03 10*3/uL (ref 0.00–0.07)
Basophils Absolute: 0 10*3/uL (ref 0.0–0.1)
Basophils Relative: 0 %
Eosinophils Absolute: 0.1 10*3/uL (ref 0.0–0.5)
Eosinophils Relative: 1 %
HCT: 25.9 % — ABNORMAL LOW (ref 36.0–46.0)
Hemoglobin: 8.8 g/dL — ABNORMAL LOW (ref 12.0–15.0)
Immature Granulocytes: 0 %
Lymphocytes Relative: 10 %
Lymphs Abs: 0.8 10*3/uL (ref 0.7–4.0)
MCH: 29 pg (ref 26.0–34.0)
MCHC: 34 g/dL (ref 30.0–36.0)
MCV: 85.5 fL (ref 80.0–100.0)
Monocytes Absolute: 1.2 10*3/uL — ABNORMAL HIGH (ref 0.1–1.0)
Monocytes Relative: 16 %
Neutro Abs: 5.4 10*3/uL (ref 1.7–7.7)
Neutrophils Relative %: 73 %
Platelets: 175 10*3/uL (ref 150–400)
RBC: 3.03 MIL/uL — ABNORMAL LOW (ref 3.87–5.11)
RDW: 25.4 % — ABNORMAL HIGH (ref 11.5–15.5)
WBC: 7.5 10*3/uL (ref 4.0–10.5)
nRBC: 0.3 % — ABNORMAL HIGH (ref 0.0–0.2)

## 2019-02-18 LAB — TRIGLYCERIDES: Triglycerides: 59 mg/dL (ref <150)

## 2019-02-18 LAB — HEMOGLOBIN A1C
Hgb A1c MFr Bld: 5.2 % (ref 4.8–5.6)
Mean Plasma Glucose: 102.54 mg/dL

## 2019-02-18 MED ORDER — SODIUM CHLORIDE 0.9 % IV SOLN: INTRAVENOUS | Status: DC

## 2019-02-18 MED ORDER — FUROSEMIDE 20 MG PO TABS
80.0000 mg | ORAL_TABLET | Freq: Two times a day (BID) | ORAL | Status: DC
  Administered 2019-02-19: 80 mg
  Filled 2019-02-18: qty 4

## 2019-02-18 MED ORDER — METOPROLOL TARTRATE 25 MG PO TABS: 25.0000 mg | ORAL_TABLET | Freq: Two times a day (BID) | ORAL | Status: DC

## 2019-02-18 MED ORDER — SENNOSIDES-DOCUSATE SODIUM 8.6-50 MG PO TABS: 1.0000 | ORAL_TABLET | Freq: Every evening | ORAL | Status: DC | PRN

## 2019-02-18 MED ORDER — ATORVASTATIN CALCIUM 40 MG PO TABS: 40.0000 mg | ORAL_TABLET | Freq: Every day | ORAL | Status: DC

## 2019-02-18 MED ORDER — HEPARIN (PORCINE) 25000 UT/250ML-% IV SOLN
1200.0000 [IU]/h | INTRAVENOUS | Status: DC
  Administered 2019-02-18: 800 [IU]/h via INTRAVENOUS
  Administered 2019-02-20 – 2019-02-21 (×2): 1200 [IU]/h via INTRAVENOUS
  Filled 2019-02-18 (×4): qty 250

## 2019-02-18 MED ORDER — FAMOTIDINE 40 MG/5ML PO SUSR: 20.0000 mg | Freq: Every day | ORAL | Status: DC

## 2019-02-18 NOTE — Progress Notes (Signed)
PT Cancellation Note  Patient Details Name: Carrie [name] MRN: [id number] DOB: 12-Aug-1941   Cancelled Treatment:    Reason Eval/Treat Not Completed: Active bedrest order   Duncan Dull 02/18/2019, 8:42 AM

## 2019-02-18 NOTE — Progress Notes (Signed)
ANTICOAGULATION CONSULT NOTE - Initial Consult  Pharmacy Consult for heparin Indication: atrial fibrillation and mechanical valve  Allergies  Allergen Reactions  . Codeine Nausea And Vomiting    Patient Measurements: Height: 5\' 1"  (154.9 cm) Weight: 130 lb 1.1 oz (59 kg) IBW/kg (Calculated) : 47.8 Heparin Dosing Weight: 59kg  Vital Signs: Temp: 99.9 F (37.7 C) (05/30 1600) Temp Source: Axillary (05/30 1600) BP: 115/62 (05/30 1830) Pulse Rate: 80 (05/30 1715)  Labs: Recent Labs    02/17/19 1132 02/17/19 1148 02/17/19 1544 02/18/19 0314  HGB 9.4* 11.2* 10.5* 8.8*  HCT 29.9* 33.0* 31.0* 25.9*  PLT 143*  --   --  175  APTT 39*  --   --   --   LABPROT 21.9*  --   --   --   INR 1.9*  --   --   --   CREATININE 1.38* 1.40*  --  1.29*    Estimated Creatinine Clearance: 30.2 mL/min (A) (by C-G formula based on SCr of 1.29 mg/dL (H)).   Medical History: Past Medical History:  Diagnosis Date  . Anticoagulated on Coumadin    for mech valve and atrial fib  goal 2.0-2.5  . Anxiety   . Arthritis    "right shoulder" (03/15/2013)  . Atrial fibrillation ([place])   . Atrial flutter ([place])   . CAD (coronary artery disease) 08/11/2016  . CHF (congestive heart failure) ([place])   . Chronic combined systolic and diastolic CHF (congestive heart failure) ([place])    a. 02/2016 Echo: EF 45-50%.  . Diverticula of colon   . Exertional shortness of breath   . GERD (gastroesophageal reflux disease)   . Heart murmur   . Hemorrhage intraabdominal 03/17/2012  . History of blood transfusion    "once" (03/15/2013)  . Hyperlipidemia   . Hypertension   . Liver hemorrhage   . Migraines   . 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 Bi-leaflet [employer] mechanical prosthesis; b. 02/2016 Echo: EF 45-50%, some degree of MR, sev dil LA/RA, sev TR.  . NSTEMI (non-ST elevated myocardial infarction) ([place])    a. 02/2016 elev trop/Cath: nonobs dzs,   . Pacemaker    medtronic adapta  . Pneumonia  2009   resolved.? OPD Rx  . Severe tricuspid regurgitation    a. 02/2016 Echo: Ef 45-50%, sev TR, PASP 22mmHg.  . Sick sinus syndrome ([place])    Dr Cristopher Peru. EP study negative. Pacemaker 12/15/06 Medtronic  . Stroke [facility]    "they say I had a stroke last year" denies residual on 03/15/2013  . Subdural hematoma ([facility])     Medications:  Infusions:  . clevidipine Stopped (02/17/19 1959)  . diltiazem (CARDIZEM) infusion    . heparin    . phenylephrine (NEO-SYNEPHRINE) Adult infusion 10 mcg/min (02/18/19 1700)  . propofol (DIPRIVAN) infusion Stopped (02/18/19 0810)    Assessment: 79 yof s/p MCA M1 occlusion s/p IR revascularization. She is on chronic warfarin for afib and mechanical valve. INR yesterday was subtherapeutic at 1.9. To start IV heparin. Hgb is low at 8.8 but platelets are WNL. No bleeding noted.   Goal of Therapy:  Heparin level 0.3-0.7 units/ml Monitor platelets by anticoagulation protocol: Yes   Plan:  Heparin gtt 800 units/hr - NO BOLUSES Check an 8 hr heparin level Daily heparin level and CBC  Kamrin Spath, Rande Lawman 02/18/2019,6:52 PM

## 2019-02-18 NOTE — Progress Notes (Signed)
Initial Nutrition Assessment  RD working remotely.  DOCUMENTATION CODES:   Not applicable  INTERVENTION:   -If unable to extubated within 48 hours, recommend:  Initiate TF with Vital 1.5 at goal rate of 40 ml/h (960 ml per day) and Prostat 30 ml daily to provide 1460 kcals, 80 gm protein, 733 ml free water daily.  TF to provide 1600 kcals with addition of propofol  NUTRITION DIAGNOSIS:   Inadequate oral intake related to inability to eat as evidenced by NPO status.  GOAL:   Provide needs based on ASPEN/SCCM guidelines  MONITOR:   Vent status, Labs, Weight trends, Skin, I & O's  REASON FOR ASSESSMENT:   Ventilator    ASSESSMENT:    Carrie [name] is an 78 y.o. female who presents as a Code Stroke with sudden onset of right hemiplegia. Time of symptom onset was witnessed by family at 61. She got up from seated position, was able to walk a few steps, then collapsed. She has atrial fibrillation and a mechanical heart valve, anticoagulated with warfarin. Per EMS, she had an LVO score of 6 with findings including right sided weakness. On arrival to the ED she had right sided gaze preference, left facial droop and left hemiplegia. The patient was anxious and agitated but with no specific complaints.   Pt admitted with extensive right MCA territory ischemia versus infarction.   5/29- S/P RT common carotid  Arteriogram followed by complete revascularization of Rt MCA M 1 occlusion  Patient is currently intubated on ventilator support MV: 11.9 L/min Temp (24hrs), Avg:98.3 F (36.8 C), Min:94.4 F (34.7 C), Max:100.2 F (37.9 C)  Propofol: 5.31 ml/hr (provides 140 kcals daily)  MAP: 84  Reviewed I/O's: -589 ml x 24 hours  UOP: 2.9 L x 24 hours  Per PCCM notes, pt is making good progress; awake, interactive and following commands on the vent. Plan to extubate within the next 24-48 hours.   Reviewed wt hx; wt has been stable over the past year.   Labs reviewed: CBGS:  80-83.  NUTRITION - FOCUSED PHYSICAL EXAM:    Most Recent Value  Orbital Region  Unable to assess  Upper Arm Region  Unable to assess  Thoracic and Lumbar Region  Unable to assess  Buccal Region  Unable to assess  Temple Region  Unable to assess  Clavicle Bone Region  Unable to assess  Clavicle and Acromion Bone Region  Unable to assess  Scapular Bone Region  Unable to assess  Dorsal Hand  Unable to assess  Patellar Region  Unable to assess  Anterior Thigh Region  Unable to assess  Posterior Calf Region  Unable to assess  Edema (RD Assessment)  Unable to assess  Hair  Unable to assess  Eyes  Unable to assess  Mouth  Unable to assess  Skin  Unable to assess  Nails  Unable to assess       Diet Order:   Diet Order            Diet NPO time specified  Diet effective now        Diet NPO time specified  Diet effective now              EDUCATION NEEDS:   No education needs have been identified at this time  Skin:  Skin Assessment: Reviewed RN Assessment  Last BM:  Unknown  Height:   Ht Readings from Last 1 Encounters:  02/17/19 5\' 1"  (1.549 m)    Weight:  Wt Readings from Last 1 Encounters:  02/17/19 59 kg    Ideal Body Weight:  47.7 kg  BMI:  Body mass index is 24.58 kg/m.  Estimated Nutritional Needs:   Kcal:  1463  Protein:  75-90 grams  Fluid:  > 1.4 L    Jefferey Lippmann A. Jimmye Norman, RD, LDN, [place] Registered Dietitian II Certified Diabetes Care and Education Specialist Pager: [phone] After hours Pager: [phone]

## 2019-02-18 NOTE — Progress Notes (Signed)
STROKE TEAM PROGRESS NOTE   SUBJECTIVE (INTERVAL HISTORY) Her RT is at the bedside.  Pt still intubated, not able to extubate due to tachypnea on weaning trial and needed high PS. However, she is awake alert and following simple commands. She pointed to ET and wanted it out.    OBJECTIVE Vitals:   02/18/19 0645 02/18/19 0700 02/18/19 0759 02/18/19 0800  BP: (S) (!) 112/59 125/65 137/61   Pulse: 77 90 85   Resp: (!) 29 (!) 29 (!) 34   Temp:    99.4 F (37.4 C)  TempSrc:    Axillary  SpO2: 100% 100% 100%   Weight:      Height:        CBC:  Recent Labs  Lab 02/17/19 1132  02/17/19 1544 02/18/19 0314  WBC 3.7*  --   --  7.5  NEUTROABS 2.3  --   --  5.4  HGB 9.4*   < > 10.5* 8.8*  HCT 29.9*   < > 31.0* 25.9*  MCV 93.4  --   --  85.5  PLT 143*  --   --  175   < > = values in this interval not displayed.    Basic Metabolic Panel:  Recent Labs  Lab 02/17/19 1132 02/17/19 1148 02/17/19 1500 02/17/19 1544 02/18/19 0314  NA 137 140  --  140 139  K 4.9 4.9  --  3.9 3.5  CL 107 109  --   --  109  CO2 20*  --   --   --  18*  GLUCOSE 94 89  --   --  84  BUN 41* 46*  --   --  30*  CREATININE 1.38* 1.40*  --   --  1.29*  CALCIUM 9.6  --   --   --  9.0  MG  --   --  2.4  --   --     Lipid Panel:     Component Value Date/Time   CHOL 81 02/18/2019 0314   TRIG 59 02/18/2019 0314   TRIG 59 02/18/2019 0314   HDL 28 (L) 02/18/2019 0314   CHOLHDL 2.9 02/18/2019 0314   VLDL 12 02/18/2019 0314   LDLCALC 41 02/18/2019 0314   HgbA1c:  Lab Results  Component Value Date   HGBA1C 5.2 02/18/2019   Urine Drug Screen:     Component Value Date/Time   LABOPIA NONE DETECTED 02/17/2019 1500   COCAINSCRNUR NONE DETECTED 02/17/2019 1500   LABBENZ NONE DETECTED 02/17/2019 1500   AMPHETMU NONE DETECTED 02/17/2019 1500   THCU NONE DETECTED 02/17/2019 1500   LABBARB NONE DETECTED 02/17/2019 1500    Alcohol Level     Component Value Date/Time   ETH <10 02/17/2019 1132     IMAGING  Ct Angio Head W Or Wo Contrast Ct Angio Neck W Or Wo Contrast 02/17/2019 IMPRESSION:  1. Occlusion right M1 segment which appears acute. Delayed collateral flow to right M2 branches.  2. No significant carotid or vertebral artery stenosis in the neck.  3. Marked enlargement of the superior ophthalmic vein bilaterally. No evidence of carotid cavernous fistula. Marked dilatation of the jugular veins bilaterally is chronic and likely due to elevated right heart pressures. The patient has history of cardiac valvular disease.   Repeat CT Head WO Contrast Result Date: 02/18/2019 CLINICAL DATA:  Acute stroke, follow-up EXAM: CT HEAD WITHOUT CONTRAST TECHNIQUE: Contiguous axial images were obtained from the base of the skull through the vertex without  intravenous contrast. COMPARISON:  02/17/2019 FINDINGS: Brain: Stable atrophy and chronic white matter microvascular changes. No acute intracranial hemorrhage, definite new infarction, mass lesion, midline shift, herniation, hydrocephalus, or extra-axial fluid collection. No focal mass effect or edema. Gray-white matter differentiation maintained. Cisterns are patent. Cerebellar atrophy as well. Vascular: Intracranial atherosclerosis.  No hyperdense vessel. Skull: Normal. Negative for fracture or focal lesion. Sinuses/Orbits: Scattered sinus air-fluid levels may be related to recent intubation and NG tube. Mastoids are clear. Orbits unremarkable. Other: None. IMPRESSION: No acute intracranial abnormality by noncontrast CT. Stable atrophy and chronic white matter microvascular changes. Electronically Signed   By: Jerilynn Mages.  Shick M.D.   On: 02/18/2019 18:00   Ct Head Wo Contrast 02/17/2019 IMPRESSION:  No acute intracranial abnormality. Atrophy and chronic microvascular ischemic change.   Ct Head Code Stroke Wo Contrast 02/17/2019 IMPRESSION:  1. No acute intracranial abnormality  2. ASPECTS is 10   Transthoracic Echocardiogram   02/03/2019 IMPRESSIONS  1. The left ventricle has normal systolic function with an ejection fraction of 60-65%. The cavity size was normal. There is moderately increased left ventricular wall thickness. Left ventricular diastolic function could not be evaluated due to mitral valve replacement/repair. There is abnormal septal motion consistent with RV pacemaker and right ventricular volume and pressure overload.  2. The right ventricle has moderately reduced systolic function. The cavity was moderately enlarged. There is no increase in right ventricular wall thickness.  3. Left atrial size was severely dilated.  4. Right atrial size was severely dilated.  5. A MVR mechanical valve is present in the mitral position.  6. The tricuspid valve is grossly normal. Tricuspid valve regurgitation is moderate-severe.  7. The aortic valve is tricuspid. Mild calcification of the aortic valve. Aortic valve regurgitation is mild by color flow Doppler. Mild stenosis of the aortic valve.  8. The inferior vena cava was dilated in size with <50% respiratory variability.  9. The interatrial septum was not well visualized. 10. When compared to the prior study: 03/31/2018: LVEF 60-65%, RVSP 66 mmHg.   EKG - atrial fibrillation - ventricular response 68 BPM (See cardiology reading for complete details)   PHYSICAL EXAM  Temp:  [94.4 F (34.7 C)-100.2 F (37.9 C)] 99.4 F (37.4 C) (05/30 0800) Pulse Rate:  [39-148] 86 (05/30 1115) Resp:  [15-34] 32 (05/30 1115) BP: (100-156)/(45-113) 123/57 (05/30 1115) SpO2:  [96 %-100 %] 100 % (05/30 1115) FiO2 (%):  [40 %-100 %] 40 % (05/30 0817)  General - Well nourished, well developed, intubated on low dose sedation.  Ophthalmologic - fundi not visualized due to noncooperation.  Cardiovascular - irregularly irregular heart rate and rhythm.  Neuro - intubated on low dose propofol, but awake alert and eyes open, and following simple commands. PERRL, EOMI, no gaze  preference, visual field full, tracking bilaterally. Corneal reflex present, gag and cough present. Breathing over the vent.  Facial symmetry not able to test due to ET tube.  Tongue midline on protrusion. BUE 4/5 and BLE 3+/5. DTR 1+ and no babinski. Sensation symmetrical, coordination seems to have Left FTN mild dysmetria and gait not tested.   ASSESSMENT/PLAN Carrie [name] is a 78 y.o. female with history of a-fib, mechanincal heart valve, warfarin therapy, pacemaker, prior SDH, CAD, CHF, HLD, HTN, NSTEMI in 2017 and prior stroke presenting after she collapsed with right sided gaze preference, left facial droop and left hemiplegia. She did not receive IV t-PA due to warfarin anticoagulation with INR 1.9.  Stroke: suspected Rt MCA  infarct with right M1 cut off s/p IR with TICI3 reperfusion, likely due to mechanical fall and A. fib with subtherapeutic INR  Resultant no focal deficit  CT head - No acute intracranial abnormality   Repeat Head CT -no acute intracranial abnormality  MRI head - PPM not compatible with MRI  CTA H&N - Occlusion right M1 segment which appears acute.   IR with TICI3 reperfusion  2D Echo - EF 60 - 65%. No cardiac source of emboli identified.   [employer] Virus 2  - negative  LDL - 41  HgbA1c - 5.2  INR 1.9 on admission  UDS  - negative  VTE prophylaxis - SCDs  warfarin daily prior to admission, now on Heparin IV for mechanical valve and atrial fibrillation  Ongoing aggressive stroke risk factor management  Therapy recommendations:  pending  Disposition:  Pending  Mechanical mitral valve  Followed by cardiology  On Coumadin PTA  INR 1.9 on admission  INR goal 2.0-2.5 due to history of SDH and liver hemorrhage  Currently on heparin IV  Chronic A. 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  Also found to have small SAH suspected posttraumatic versus septic emboli, which subsequently resolved, put on heparin drip with Coumadin.  Respiratory failure  Intubated for procedure  Difficulty extubation due to tachycardia on weaning trial  Needing high PS at this time  CCM on board  Extubate as able  Hypotension History of hypertension  BP on the lower side . Currently on low-dose neo . BP goal normotensive . IV fluid @ 40  Hyperlipidemia  Lipid lowering medication PTA:  Lipitor 40 mg daily  LDL 41, goal < 70  Current lipid lowering medication:  Lipitor 40 mg daily  Continue statin at discharge  Anemia  Hemoglobin 9.4-10.5-8.8  H&H stat pending  Currently on IV heparin, close CBC monitoring  PRBC transfusion if Hb < 7  Other Stroke Risk Factors  Advanced age  Family hx stroke (father and brother)  Coronary artery disease, non-STEMI 2017  Migraines  CHF  Other Active Problems  Anemia - 9.4->10.5->8.8  CKD stage III creatinine - 1.38->1.40->1.29  Pacemaker  History of SDH  Hospital day # 1  This patient is critically ill due to right MCA stroke status post IR, mechanical valve, chronic A. fib, chronic anticoagulation, anemia, respiratory failure and at significant risk of neurological worsening, death form recurrent stroke, hemorrhagic conversion, heart failure, hemorrhagic shock, anemia. This patient's care requires constant monitoring of vital signs, hemodynamics, respiratory and cardiac monitoring, review of multiple databases, neurological assessment, discussion with family, other specialists and medical decision making of high complexity. I spent 40 minutes of neurocritical care time in the care of this patient.  To contact Stroke Continuity provider, please refer to [email or web address] After hours, contact General Neurology

## 2019-02-18 NOTE — Progress Notes (Signed)
Called husband and updated. He is aware that she is doing very well neurologically but still requiring ventilator support.

## 2019-02-18 NOTE — Progress Notes (Signed)
OT Cancellation Note  Patient Details Name: Carrie [name] MRN: [id number] DOB: 1940/10/12   Cancelled Treatment:    Reason Eval/Treat Not Completed: Patient not medically ready.  Will reattempt.   Lucille Passy, OTR/L Acute Rehabilitation Services Pager [phone] Office [phone]   Lucille Passy M 02/18/2019, 3:00 PM

## 2019-02-18 NOTE — Progress Notes (Signed)
Clarified BP goals with Dr. Leonel Ramsay. New SBP goals now 100-140

## 2019-02-18 NOTE — Progress Notes (Signed)
Patient placed back on full vent support due to increased RR (mid 40's).  MD and RN notified.

## 2019-02-18 NOTE — Progress Notes (Signed)
NAME:  Carrie [name], MRN:  [id number], DOB:  1941/06/02, LOS: 1 ADMISSION DATE:  02/17/2019, CONSULTATION DATE:  02/17/2019 REFERRING MD:  Dr. Estanislado Pandy, CHIEF COMPLAINT:  AMS   Brief History   [name] who presented to [facility] 5/29 with left sided weakness, facial droop and slurred speech.  Work up consistent with large vessel occlusion and patient taken emergently to neuro IR for revascularization.  History of present illness   [name] who presented to [facility] 5/29 with left sided weakness, facial droop and slurred speech. The patient was evaluated for CVA with CT Head which revealed no acute abnormality.  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  INR was elevated and she was not a candidate for tPA.  The patient was taken to interventional radiology per Dr. 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  The patient was returned to ICU on mechanical ventilation.    PCCM consulted for assistance with post-operative ICU management.    Past Medical History  SSS s/p Pacemker Severe TR  Rheumatic Mitral Valve Disease  HTN  HLD NSTEMI  CHF - chronic combined  CAD  AF - on anticoagulation  SDH   Significant Hospital Events   5/29-right M1 occlusion-->thrombectomy  Consults:  PCCM  Procedures:  5/29 ETT   Significant Diagnostic Tests:  5/29 CT Head CODE Stroke >> no acute abnormality  5/29 CTA Head / Neck >> acute occlusion of the right M1 segment, delayed collateral flow to the R M2 branches.  Marked dilatation of the jugular veins bilaterally likely due to elevated right heart pressures.    Micro Data:  COVID negative 5/29  Antimicrobials:  n/a   Interim history/subjective:  Post procedure 529, sedation was continued but the patient was still nonresponsive after what was deemed to be an appropriate amount of time for the  sedation to wear off.  She was taken urgently repeat CT of the head, which is unchanged.  Overnight her neurologic status has improved markedly.  She is awake, interactive, following commands, moving all of her extremities, and demonstrating good strength on her left side.  Still continues to be somewhat tachypneic. Objective   Blood pressure 137/61, pulse 85, temperature 99.1 F (37.3 C), temperature source Oral, resp. rate (!) 34, height 5\' 1"  (1.549 m), weight 59 kg, SpO2 100 %.    Vent Mode: PSV;CPAP FiO2 (%):  [40 %-100 %] 40 % Set Rate:  [10 bmp-16 bmp] 10 bmp Vt Set:  [380 mL] 380 mL PEEP:  [5 cmH20] 5 cmH20 Pressure Support:  [18 cmH20] 18 cmH20 Plateau Pressure:  [18 cmH20-21 cmH20] 21 cmH20   Intake/Output Summary (Last 24 hours) at 02/18/2019 0835 Last data filed at 02/18/2019 0700 Gross per 24 hour  Intake 2435.29 ml  Output 3024 ml  Net -588.71 ml   Filed Weights   02/17/19 1100  Weight: 59 kg    Examination: General: NAD HENT: AT/[place], tongue midline Lungs: CTA R, rhonchi left, which seem to clear with requested deep inspiration and cough Cardiovascular: RRR, no r/m/g Abdomen: SND Extremities: no edema, 2+ pulses radial, DP Neuro: CN II-XII grossly intact to confrontation. Responds appropriately to Y/N questions, MAE on request GU: foley in place, urine clear  Resolved Hospital Problem list     Assessment & Plan:  Right M1 Occlusion / CVA -s/p revascularization  of right common carotid  Hx SDH  P: SBP goal 120-140; pressors off Further neuro imaging per Stroke Service  PAD protocol for sedation, RASS Goal 0 to -1   Acute Respiratory Insufficiency requiring Mechanical Ventilation  -in setting of CVA P: PRVC 8cc/kg  Wean PEEP / FiO2 for sats > 90% Hx of elevated right heart pressures may prove to be difficult to wean from vent  Daily SBT / WUA; not much progress this morning, back on full support   AKI  P: Trend BMP / urinary output Replace  electrolytes as indicated Avoid nephrotoxic agents, ensure adequate renal perfusion  Chronic Combined CHF  SSS s/p Pacemaker  Hx Bioprosthetic MV AF - on coumadin P: Restart diuretics in am  Hold coumadin  Defer ASA decision to Neurology  Continue lopressor for rhythm stabilization  Lipitor   Anemia P: Trend CBC  Transfuse per ICU guidelines   Hypothermia  P: Bair hugger  Assess TSH, cortisol  Best practice:  Diet: NPO, hope to extubate in next 24-48 hours  Pain/Anxiety/Delirium protocol (if indicated): n/a VAP protocol (if indicated): in place DVT prophylaxis: SCDs GI prophylaxis: PPI, HOB >30 Glucose control: monitor Mobility: bed Code Status: full Family Communication: will update today Disposition: 4N ICU  Labs   CBC: Recent Labs  Lab 02/17/19 1132 02/17/19 1148 02/17/19 1544 02/18/19 0314  WBC 3.7*  --   --  7.5  NEUTROABS 2.3  --   --  5.4  HGB 9.4* 11.2* 10.5* 8.8*  HCT 29.9* 33.0* 31.0* 25.9*  MCV 93.4  --   --  85.5  PLT 143*  --   --  240    Basic Metabolic Panel: Recent Labs  Lab 02/17/19 1132 02/17/19 1148 02/17/19 1500 02/17/19 1544 02/18/19 0314  NA 137 140  --  140 139  K 4.9 4.9  --  3.9 3.5  CL 107 109  --   --  109  CO2 20*  --   --   --  18*  GLUCOSE 94 89  --   --  84  BUN 41* 46*  --   --  30*  CREATININE 1.38* 1.40*  --   --  1.29*  CALCIUM 9.6  --   --   --  9.0  MG  --   --  2.4  --   --    GFR: Estimated Creatinine Clearance: 30.2 mL/min (A) (by C-G formula based on SCr of 1.29 mg/dL (H)). Recent Labs  Lab 02/17/19 1132 02/18/19 0314  WBC 3.7* 7.5    Liver Function Tests: Recent Labs  Lab 02/17/19 1132  AST 98*  ALT 23  ALKPHOS 129*  BILITOT 1.2  PROT 8.0  ALBUMIN 3.3*   No results for input(s): LIPASE, AMYLASE in the last 168 hours. No results for input(s): AMMONIA in the last 168 hours.  ABG    Component Value Date/Time   PHART 7.553 (H) 02/17/2019 1544   PCO2ART 23.0 (L) 02/17/2019 1544    PO2ART 130.0 (H) 02/17/2019 1544   HCO3 20.7 02/17/2019 1544   TCO2 21 (L) 02/17/2019 1544   ACIDBASEDEF 1.0 02/17/2019 1544   O2SAT 99.0 02/17/2019 1544     Coagulation Profile: Recent Labs  Lab 02/14/19 1306 02/17/19 1132  INR 1.6* 1.9*    Cardiac Enzymes: No results for input(s): CKTOTAL, CKMB, CKMBINDEX, TROPONINI in the last 168 hours.  HbA1C: Hgb A1c MFr Bld  Date/Time Value Ref Range Status  02/18/2019 03:14 AM 5.2 4.8 -  5.6 % Final    Comment:    (NOTE) Pre diabetes:          5.7%-6.4% Diabetes:              >6.4% Glycemic control for   <7.0% adults with diabetes   03/31/2018 01:12 AM 4.7 (L) 4.8 - 5.6 % Final    Comment:    (NOTE) Pre diabetes:          5.7%-6.4% Diabetes:              >6.4% Glycemic control for   <7.0% adults with diabetes     CBG: Recent Labs  Lab 02/17/19 1628 02/17/19 1928 02/17/19 2319 02/18/19 0328 02/18/19 0758  GLUCAP 163* 88 87 80 83    Review of Systems:   Unable to obtain; pt intubated  Past Medical History  She,  has a past medical history of Anticoagulated on Coumadin, Anxiety, Arthritis, Atrial fibrillation (HCC), Atrial flutter ([place]), CAD (coronary artery disease) (08/11/2016), CHF (congestive heart failure) ([place]), Chronic combined systolic and diastolic CHF (congestive heart failure) ([place]), Diverticula of colon, Exertional shortness of breath, GERD (gastroesophageal reflux disease), Heart murmur, Hemorrhage intraabdominal (03/17/2012), History of blood transfusion, Hyperlipidemia, Hypertension, Liver hemorrhage, Migraines, Mitral valve regurgitation, 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   Surgical History    Past Surgical History:  Procedure Laterality Date   APPENDECTOMY     CARDIAC CATHETERIZATION  04/02/97   R&L:severe MR/pulmonary  hypertension   CARDIAC CATHETERIZATION N/A 03/16/2016   Procedure: Left Heart Cath and Coronary Angiography;  Surgeon: [name], MD;  Location: [place] [facility];  Service: Cardiovascular;  Laterality: N/A;   CARDIAC CATHETERIZATION N/A 09/30/2016   Procedure: Left Heart Cath and Coronary Angiography;  Surgeon: [name], MD;  Location: [place] [facility];  Service: Cardiovascular;  Laterality: N/A;   CARDIAC CATHETERIZATION N/A 09/30/2016   Procedure: Coronary Stent Intervention;  Surgeon: [name], MD;  3.5 x 12 mm resolute Onyx  to ostial RCA   CATARACT EXTRACTION W/ INTRAOCULAR LENS  IMPLANT, BILATERAL Bilateral 2013   CORONARY ANGIOPLASTY     DILATION AND CURETTAGE OF UTERUS     "had fibroids" (03/15/2013)   EXPLORATORY LAPAROTOMY     "had a growth on my intestines" (03/15/2013)   HAMMER TOE SURGERY Right    INSERT / REPLACE / REMOVE PACEMAKER  12/15/2006   Medtronic   MITRAL VALVE REPLACEMENT  1998   St Jude mechanical; Dr. [name]   PACEMAKER PLACEMENT  12/15/06   medtronic adapta for SSS   PERMANENT PACEMAKER GENERATOR CHANGE N/A 07/24/2014   Procedure: PERMANENT PACEMAKER GENERATOR CHANGE;  Surgeon: [name], MD;  Location: [facility];  Service: Cardiovascular;  Laterality: N/A;   PERSANTINE CARDIOLITE  08/07/03   mild inf. ischemia    RIGHT HEART CATH N/A 02/06/2019   Procedure: RIGHT HEART CATH;  Surgeon: [name], MD;  Location: [facility];  Service: Cardiovascular;  Laterality: N/A;   TEE WITHOUT CARDIOVERSION  09/23/2011   Procedure: TRANSESOPHAGEAL ECHOCARDIOGRAM (TEE);  Surgeon: [name];  Location: [facility];  Service: Cardiovascular;  Laterality: N/A;   TONSILLECTOMY     TUBAL LIGATION     US ECHOCARDIOGRAPHY  11/19/2011   EF 50-55%,RA mod to severely dilated,LA severely dilated,trace MR,small vegetation or mass on the MV,AOV mildly scleroticmild PI, RV pressure 40-93mmHg  Social History    reports that she has never smoked. She has never used smokeless tobacco. She reports that she does not drink alcohol or use drugs.   Family History   Her family history includes Cancer in her mother and sister; Diabetes in her sister; Healthy in her brother, brother, brother, and sister; Heart attack in her maternal grandfather; Kidney disease in her daughter; Leukemia in her sister; Stroke in her brother and father.   Allergies Allergies  Allergen Reactions   Codeine Nausea And Vomiting     Home Medications  Prior to Admission medications   Medication Sig Start Date End Date Taking? Authorizing Provider  atorvastatin (LIPITOR) 40 MG tablet TAKE 1 TABLET BY MOUTH EVERY DAY AT 6PM Patient taking differently: Take 40 mg by mouth daily at 6 PM.  09/27/18   Croitoru, Mihai, MD  Biotin 1000 MCG tablet Take 1,000 mcg by mouth daily.    [provider]  colchicine 0.6 MG tablet Take 1 tablet (0.6 mg total) by mouth daily for 4 days. 02/07/19 02/11/19  Kayleen Memos, DO  dextromethorphan-guaiFENesin (MUCINEX DM) 30-600 MG 12hr tablet Take 1 tablet by mouth 2 (two) times daily as needed for cough. 04/01/18   Hosie Poisson, MD  docusate sodium (COLACE) 100 MG capsule Take 200 mg by mouth 2 (two) times daily.     [provider]  furosemide (LASIX) 80 MG tablet Take 1 tablet (80 mg total) by mouth 2 (two) times daily. 02/07/19   Kayleen Memos, DO  metolazone (ZAROXOLYN) 2.5 MG tablet Take 2.5 mg when your weight is above 137 lbs as recommended by cardiology. 02/07/19   Kayleen Memos, DO  metoprolol tartrate (LOPRESSOR) 25 MG tablet Take 1 tablet (25 mg total) by mouth 2 (two) times daily. 11/10/16   Croitoru, Mihai, MD  Multiple Vitamin (MULTIVITAMINS PO) Take 1 tablet by mouth daily.     [provider]  nitroGLYCERIN (NITROSTAT) 0.4 MG SL tablet Place 1 tablet (0.4 mg total) under the tongue every 5 (five) minutes as needed for chest pain. 11/20/16   Croitoru, Mihai, MD  Omega-3  Fatty Acids (FISH OIL) 1000 MG CAPS Take 1,000 mg by mouth daily.    [provider]  oxymetazoline (AFRIN) 0.05 % nasal spray Place 1 spray into both nostrils 2 (two) times daily as needed for congestion. 02/07/19   Kayleen Memos, DO  polyethylene glycol (MIRALAX / GLYCOLAX) packet Take 17 g by mouth as needed (for constipation). Reported on 03/25/2016    [provider]  polyvinyl alcohol (LIQUIFILM TEARS) 1.4 % ophthalmic solution Place 1 drop into the left eye as needed for dry eyes. 02/07/19   Kayleen Memos, DO  potassium chloride SA (K-DUR,KLOR-CON) 20 MEQ tablet Take 2 tablets (40 mEq total) by mouth every morning AND 1 tablet (20 mEq total) every evening. 06/01/18   Croitoru, Mihai, MD  spironolactone (ALDACTONE) 25 MG tablet Take 0.5 tablets (12.5 mg total) by mouth daily. 02/08/19   Kayleen Memos, DO  warfarin (COUMADIN) 5 MG tablet TAKE 1 TO 1 AND 1/2 TABLETS BY MOUTH DAILY AS DIRECTED Patient taking differently: Take 2.5-5 mg by mouth See admin instructions. Taking 1/2 tablet (2.5) mg on Monday and Thursday. All other days 5mg  tablet 12/15/18   Croitoru, Dani Gobble, MD     Critical care time: I have independently seen and examined the patient, reviewed data, and developed an assessment and plan. A total of 35 minutes were spent in  critical care assessment and medical decision making. This critical care time does not reflect procedure time, or teaching time or supervisory time of PA/NP/Med student/Med Resident, etc but could involve care discussion time.     Bonna Gains, MD PhD 02/18/19 8:46 AM

## 2019-02-18 NOTE — Progress Notes (Signed)
SLP Cancellation Note  Patient Details Name: Carrie [name] MRN: [id number] DOB: 01/26/1941   Cancelled treatment:       Reason Eval/Treat Not Completed: Patient not medically ready - remains on the vent per chart review. Will f/u for readiness.   Venita Sheffield Pier Bosher 02/18/2019, 8:10 AM  Pollyann Glen, M.A. [place] Acute Environmental education officer [phone] Office [phone]

## 2019-02-18 NOTE — Progress Notes (Signed)
Pt transported to /from CT scan via vent w/ no apparent complications.

## 2019-02-18 NOTE — Progress Notes (Signed)
Referring Physician(s): Code stroke  Supervising Physician: [name]  Patient Status:  [facility] - In-pt  Chief Complaint:  S/P RT common carotid  Arteriogram followed by complete revascularization of Rt MCA M 1 occlusion with x 1 pass with  91mm  X 51mm embotrap and penumbra aspiration catheter achieving a TICI 3 revascularization.  Subjective:  Patient remains intubated. Opens eyes to voice and follows commands.  Allergies: Codeine  Medications: Prior to Admission medications   Medication Sig Start Date End Date Taking? Authorizing Provider  atorvastatin (LIPITOR) 40 MG tablet TAKE 1 TABLET BY MOUTH EVERY DAY AT 6PM Patient taking differently: Take 40 mg by mouth daily at 6 PM.  09/27/18   Croitoru, Mihai, MD  Biotin 1000 MCG tablet Take 1,000 mcg by mouth daily.    [provider]  colchicine 0.6 MG tablet Take 1 tablet (0.6 mg total) by mouth daily for 4 days. 02/07/19 02/11/19  Kayleen Memos, DO  dextromethorphan-guaiFENesin (MUCINEX DM) 30-600 MG 12hr tablet Take 1 tablet by mouth 2 (two) times daily as needed for cough. 04/01/18   Hosie Poisson, MD  docusate sodium (COLACE) 100 MG capsule Take 200 mg by mouth 2 (two) times daily.     [provider]  furosemide (LASIX) 80 MG tablet Take 1 tablet (80 mg total) by mouth 2 (two) times daily. 02/07/19   Kayleen Memos, DO  metolazone (ZAROXOLYN) 2.5 MG tablet Take 2.5 mg when your weight is above 137 lbs as recommended by cardiology. 02/07/19   Kayleen Memos, DO  metoprolol tartrate (LOPRESSOR) 25 MG tablet Take 1 tablet (25 mg total) by mouth 2 (two) times daily. 11/10/16   Croitoru, Mihai, MD  Multiple Vitamin (MULTIVITAMINS PO) Take 1 tablet by mouth daily.     [provider]  nitroGLYCERIN (NITROSTAT) 0.4 MG SL tablet Place 1 tablet (0.4 mg total) under the tongue every 5 (five) minutes as needed for chest pain. 11/20/16   Croitoru, Mihai, MD  Omega-3 Fatty Acids (FISH OIL) 1000 MG CAPS Take 1,000  mg by mouth daily.    [provider]  oxymetazoline (AFRIN) 0.05 % nasal spray Place 1 spray into both nostrils 2 (two) times daily as needed for congestion. 02/07/19   Kayleen Memos, DO  polyethylene glycol (MIRALAX / GLYCOLAX) packet Take 17 g by mouth as needed (for constipation). Reported on 03/25/2016    [provider]  polyvinyl alcohol (LIQUIFILM TEARS) 1.4 % ophthalmic solution Place 1 drop into the left eye as needed for dry eyes. 02/07/19   Kayleen Memos, DO  potassium chloride SA (K-DUR,KLOR-CON) 20 MEQ tablet Take 2 tablets (40 mEq total) by mouth every morning AND 1 tablet (20 mEq total) every evening. 06/01/18   Croitoru, Mihai, MD  spironolactone (ALDACTONE) 25 MG tablet Take 0.5 tablets (12.5 mg total) by mouth daily. 02/08/19   Kayleen Memos, DO  warfarin (COUMADIN) 5 MG tablet TAKE 1 TO 1 AND 1/2 TABLETS BY MOUTH DAILY AS DIRECTED Patient taking differently: Take 2.5-5 mg by mouth See admin instructions. Taking 1/2 tablet (2.5) mg on Monday and Thursday. All other days 5mg  tablet 12/15/18   Croitoru, Mihai, MD     Vital Signs: BP 137/61    Pulse 85    Temp 99.4 F (37.4 C) (Axillary)    Resp (!) 34    Ht 5\' 1"  (1.549 m)    Wt 59 kg    SpO2 100%    BMI 24.58  kg/m   Physical Exam Intubated Opens eyes to voice. Nods appropriately to questions 5/5 grip strength bilaterally No facial droop. Right groin stick looks good, no hematoma or pseudoanuerysm.  Imaging: Ct Angio Head W Or Wo Contrast  Result Date: 02/17/2019 CLINICAL DATA:  Code stroke.  Left-sided weakness EXAM: CT ANGIOGRAPHY HEAD AND NECK TECHNIQUE: Multidetector CT imaging of the head and neck was performed using the standard protocol during bolus administration of intravenous contrast. Multiplanar CT image reconstructions and MIPs were obtained to evaluate the vascular anatomy. Carotid stenosis measurements (when applicable) are obtained utilizing NASCET criteria, using the distal internal carotid  diameter as the denominator. CONTRAST:  17mL OMNIPAQUE IOHEXOL 350 MG/ML SOLN COMPARISON:  02/17/2019 FINDINGS: CTA NECK FINDINGS Aortic arch: Mild atherosclerotic disease in the aortic arch without acute abnormality. Mild atherosclerotic disease proximal great vessels which are widely patent. Right carotid system: Right carotid widely patent with minimal atherosclerotic disease at the bifurcation. Left carotid system: Left carotid widely patent with minimal atherosclerotic disease at the bifurcation. Vertebral arteries: Both vertebral arteries widely patent to the basilar. Mild stenosis at the origin of the right vertebral artery. Skeleton: No acute abnormality. Other neck: Negative for mass or adenopathy in the neck. Chronic diffuse dilatation of the jugular veins bilaterally right greater than left as seen on prior CT chest 03/30/2018. SVC was not occluded on the prior study. There is a left-sided pacemaker. The left innominate is mildly narrowed but not significantly so. Marked enlargement of the superior ophthalmic vein bilaterally likely is due to elevated venous pressures. Upper chest: Mild patchy airspace disease in the lung apices bilaterally. Possible edema or pneumonia. Review of the MIP images confirms the above findings CTA HEAD FINDINGS Anterior circulation: Atherosclerotic calcification in the cavernous carotid bilaterally not causing significant stenosis. Anterior cerebral arteries patent bilaterally. Left middle cerebral artery widely patent. Occlusion right M1 segment. There is delayed filling of right M2 branches due to collateral circulation. Posterior circulation: Both vertebral arteries patent to the basilar. PICA patent bilaterally. Basilar widely patent. Superior cerebellar and posterior cerebral arteries patent bilaterally. Venous sinuses: Marked enlargement of the superior ophthalmic vein bilaterally. This may be partially thrombosed on the left but not the right. No evidence of carotid  cavernous fistula. Superior sagittal sinus patent. Transverse sinus patent bilaterally. Anatomic variants: None Delayed phase: Not performed Review of the MIP images confirms the above findings IMPRESSION: 1. Occlusion right M1 segment which appears acute. Delayed collateral flow to right M2 branches. 2. No significant carotid or vertebral artery stenosis in the neck. 3. Marked enlargement of the superior ophthalmic vein bilaterally. No evidence of carotid cavernous fistula. Marked dilatation of the jugular veins bilaterally is chronic and likely due to elevated right heart pressures. The patient has history of cardiac valvular disease. 4. Results were texted to Dr. Cheral Marker at the time of the report. Electronically Signed   By: Franchot Gallo M.D.   On: 02/17/2019 12:01   Ct Head Wo Contrast  Result Date: 02/17/2019 CLINICAL DATA:  Altered level of consciousness. Patient status post cerebral angiogram and treatment of a right MCA embolus today. EXAM: CT HEAD WITHOUT CONTRAST TECHNIQUE: Contiguous axial images were obtained from the base of the skull through the vertex without intravenous contrast. COMPARISON:  Head CT scan 02/17/2019. FINDINGS: Brain: No evidence of acute infarction, hemorrhage, hydrocephalus, extra-axial collection or mass lesion/mass effect. Cortical atrophy and chronic microvascular ischemic change noted. Vascular: No hyperdense vessel or unexpected calcification. Skull: Intact.  No focal lesion. Sinuses/Orbits:  Small mucous retention cyst or polyp right maxillary sinus and very short air-fluid level in the right maxillary sinus noted. Scattered ethmoid air cell disease. Other: Endotracheal tube noted. IMPRESSION: No acute intracranial abnormality. Atrophy and chronic microvascular ischemic change. Electronically Signed   By: Inge Rise M.D.   On: 02/17/2019 17:19   Ct Angio Neck W Or Wo Contrast  Result Date: 02/17/2019 CLINICAL DATA:  Code stroke.  Left-sided weakness EXAM: CT  ANGIOGRAPHY HEAD AND NECK TECHNIQUE: Multidetector CT imaging of the head and neck was performed using the standard protocol during bolus administration of intravenous contrast. Multiplanar CT image reconstructions and MIPs were obtained to evaluate the vascular anatomy. Carotid stenosis measurements (when applicable) are obtained utilizing NASCET criteria, using the distal internal carotid diameter as the denominator. CONTRAST:  27mL OMNIPAQUE IOHEXOL 350 MG/ML SOLN COMPARISON:  02/17/2019 FINDINGS: CTA NECK FINDINGS Aortic arch: Mild atherosclerotic disease in the aortic arch without acute abnormality. Mild atherosclerotic disease proximal great vessels which are widely patent. Right carotid system: Right carotid widely patent with minimal atherosclerotic disease at the bifurcation. Left carotid system: Left carotid widely patent with minimal atherosclerotic disease at the bifurcation. Vertebral arteries: Both vertebral arteries widely patent to the basilar. Mild stenosis at the origin of the right vertebral artery. Skeleton: No acute abnormality. Other neck: Negative for mass or adenopathy in the neck. Chronic diffuse dilatation of the jugular veins bilaterally right greater than left as seen on prior CT chest 03/30/2018. SVC was not occluded on the prior study. There is a left-sided pacemaker. The left innominate is mildly narrowed but not significantly so. Marked enlargement of the superior ophthalmic vein bilaterally likely is due to elevated venous pressures. Upper chest: Mild patchy airspace disease in the lung apices bilaterally. Possible edema or pneumonia. Review of the MIP images confirms the above findings CTA HEAD FINDINGS Anterior circulation: Atherosclerotic calcification in the cavernous carotid bilaterally not causing significant stenosis. Anterior cerebral arteries patent bilaterally. Left middle cerebral artery widely patent. Occlusion right M1 segment. There is delayed filling of right M2  branches due to collateral circulation. Posterior circulation: Both vertebral arteries patent to the basilar. PICA patent bilaterally. Basilar widely patent. Superior cerebellar and posterior cerebral arteries patent bilaterally. Venous sinuses: Marked enlargement of the superior ophthalmic vein bilaterally. This may be partially thrombosed on the left but not the right. No evidence of carotid cavernous fistula. Superior sagittal sinus patent. Transverse sinus patent bilaterally. Anatomic variants: None Delayed phase: Not performed Review of the MIP images confirms the above findings IMPRESSION: 1. Occlusion right M1 segment which appears acute. Delayed collateral flow to right M2 branches. 2. No significant carotid or vertebral artery stenosis in the neck. 3. Marked enlargement of the superior ophthalmic vein bilaterally. No evidence of carotid cavernous fistula. Marked dilatation of the jugular veins bilaterally is chronic and likely due to elevated right heart pressures. The patient has history of cardiac valvular disease. 4. Results were texted to Dr. Cheral Marker at the time of the report. Electronically Signed   By: Franchot Gallo M.D.   On: 02/17/2019 12:01   Dg Chest Port 1 View  Result Date: 02/17/2019 CLINICAL DATA:  Encounter for intubation. EXAM: PORTABLE CHEST 1 VIEW COMPARISON:  Radiograph of Feb 02, 2019. FINDINGS: Stable cardiomegaly. Status post cardiac valve repair. Endotracheal tube is seen projected over tracheal air shadow with distal tip approximately 1.5 cm above the carina. Nasogastric tube is seen entering stomach. Single lead left-sided pacemaker is unchanged. No pneumothorax is noted. Left  lung is clear. Stable right lower lobe atelectasis and small pleural effusion is noted. Fluid is noted in the right minor fissure. Bony thorax is unremarkable. IMPRESSION: Endotracheal and nasogastric tubes are in good position. Stable cardiomegaly. Stable right lower lobe atelectasis and small pleural  effusion is noted. Increased amount of fluid is seen in right minor fissure. Electronically Signed   By: Marijo Conception M.D.   On: 02/17/2019 12:51   Ct Head Code Stroke Wo Contrast`  Result Date: 02/17/2019 CLINICAL DATA:  Code stroke.  Left-sided weakness. EXAM: CT HEAD WITHOUT CONTRAST TECHNIQUE: Contiguous axial images were obtained from the base of the skull through the vertex without intravenous contrast. COMPARISON:  CT head 04/01/2018 FINDINGS: Brain: Image quality degraded by motion Moderate atrophy. Negative for hydrocephalus. Negative for acute hemorrhage or mass Patchy hypodensity in the white matter bilaterally including the anterior basal ganglia bilaterally is stable. No area of acute infarct or edema. Chronic ischemic changes in the cerebellum bilaterally are stable. Vascular: Negative for hyperdense vessel Skull: Negative Sinuses/Orbits: Paranasal sinuses clear. Bilateral cataract surgery. Other: None ASPECTS ([place] Stroke Program Early CT Score) - Ganglionic level infarction (caudate, lentiform nuclei, internal capsule, insula, M1-M3 cortex): 7 - Supraganglionic infarction (M4-M6 cortex): 3 Total score (0-10 with 10 being normal): 10 IMPRESSION: 1. No acute intracranial abnormality 2. ASPECTS is 10 3. These results were called by telephone at the time of interpretation on 02/17/2019 at 11:39 am to Dr. Kerney Elbe , who verbally acknowledged these results. Electronically Signed   By: Franchot Gallo M.D.   On: 02/17/2019 11:39    Labs:  CBC: Recent Labs    02/06/19 0606  02/07/19 0527 02/17/19 1132 02/17/19 1148 02/17/19 1544 02/18/19 0314  WBC 5.1  --  5.7 3.7*  --   --  7.5  HGB 9.0*   < > 8.8* 9.4* 11.2* 10.5* 8.8*  HCT 27.0*   < > 26.6* 29.9* 33.0* 31.0* 25.9*  PLT 145*  --  146* 143*  --   --  175   < > = values in this interval not displayed.    COAGS: Recent Labs    02/06/19 0606 02/07/19 0527 02/14/19 1306 02/17/19 1132  INR 2.3* 2.0* 1.6* 1.9*  APTT  --    --   --  39*    BMP: Recent Labs    02/06/19 0606  02/07/19 0527 02/17/19 1132 02/17/19 1148 02/17/19 1544 02/18/19 0314  NA 135   < > 135 137 140 140 139  K 3.9   < > 4.0 4.9 4.9 3.9 3.5  CL 99  --  100 107 109  --  109  CO2 27  --  26 20*  --   --  18*  GLUCOSE 97  --  99 94 89  --  84  BUN 41*  --  41* 41* 46*  --  30*  CALCIUM 8.8*  --  8.9 9.6  --   --  9.0  CREATININE 1.55*  --  1.09* 1.38* 1.40*  --  1.29*  GFRNONAA 32*  --  49* 37*  --   --  40*  GFRAA 37*  --  57* 43*  --   --  46*   < > = values in this interval not displayed.    LIVER FUNCTION TESTS: Recent Labs    03/30/18 2051 06/06/18 1407 02/17/19 1132  BILITOT 1.7* 1.5* 1.2  AST 137* 134* 98*  ALT 28 23 23   ALKPHOS  117 163* 129*  PROT 7.2 7.9 8.0  ALBUMIN 3.0* 4.1 3.3*    Assessment and Plan:  S/P RT common carotid  Arteriogram followed by complete revascularization of Rt MCA M 1 occlusion with x 1 pass with  5mm  X 43mm embotrap and penumbra aspiration catheter achieving a TICI 3 revascularization.  Hopefully extubate today.  Groin stick ok. Ok for patient to sit up now  Electronically Signed: Murrell Redden, PA-C 02/18/2019, 10:05 AM    I spent a total of 15 Minutes at the the patient's bedside AND on the patient's hospital floor or unit, greater than 50% of which was counseling/coordinating care for stroke/ s/p revasc by Deveshwar.

## 2019-02-19 ENCOUNTER — Other Ambulatory Visit: Payer: Self-pay

## 2019-02-19 DIAGNOSIS — I4821 Permanent atrial fibrillation: Secondary | ICD-10-CM

## 2019-02-19 DIAGNOSIS — Z01818 Encounter for other preprocedural examination: Secondary | ICD-10-CM

## 2019-02-19 DIAGNOSIS — E876 Hypokalemia: Secondary | ICD-10-CM

## 2019-02-19 LAB — BASIC METABOLIC PANEL
Anion gap: 13 (ref 5–15)
BUN: 25 mg/dL — ABNORMAL HIGH (ref 8–23)
CO2: 19 mmol/L — ABNORMAL LOW (ref 22–32)
Calcium: 9.1 mg/dL (ref 8.9–10.3)
Chloride: 109 mmol/L (ref 98–111)
Creatinine, Ser: 1.31 mg/dL — ABNORMAL HIGH (ref 0.44–1.00)
GFR calc Af Amer: 45 mL/min — ABNORMAL LOW (ref >60)
GFR calc non Af Amer: 39 mL/min — ABNORMAL LOW (ref >60)
Glucose, Bld: 113 mg/dL — ABNORMAL HIGH (ref 70–99)
Potassium: 2.8 mmol/L — ABNORMAL LOW (ref 3.5–5.1)
Sodium: 141 mmol/L (ref 135–145)

## 2019-02-19 LAB — GLUCOSE, CAPILLARY
Glucose-Capillary: 104 mg/dL — ABNORMAL HIGH (ref 70–99)
Glucose-Capillary: 99 mg/dL (ref 70–99)
Glucose-Capillary: 94 mg/dL (ref 70–99)

## 2019-02-19 LAB — CBC
HCT: 26.7 % — ABNORMAL LOW (ref 36.0–46.0)
Hemoglobin: 9.1 g/dL — ABNORMAL LOW (ref 12.0–15.0)
MCH: 29.2 pg (ref 26.0–34.0)
MCHC: 34.1 g/dL (ref 30.0–36.0)
MCV: 85.6 fL (ref 80.0–100.0)
Platelets: 170 10*3/uL (ref 150–400)
RBC: 3.12 MIL/uL — ABNORMAL LOW (ref 3.87–5.11)
RDW: 26.1 % — ABNORMAL HIGH (ref 11.5–15.5)
WBC: 7.6 10*3/uL (ref 4.0–10.5)
nRBC: 0.3 % — ABNORMAL HIGH (ref 0.0–0.2)

## 2019-02-19 LAB — HEPARIN LEVEL (UNFRACTIONATED)
Heparin Unfractionated: <0.1 IU/mL — ABNORMAL LOW (ref 0.30–0.70)
Heparin Unfractionated: 0.25 IU/mL — ABNORMAL LOW (ref 0.30–0.70)
Heparin Unfractionated: 0.29 IU/mL — ABNORMAL LOW (ref 0.30–0.70)

## 2019-02-19 LAB — TRIGLYCERIDES: Triglycerides: 34 mg/dL (ref <150)

## 2019-02-19 MED ORDER — DOCUSATE SODIUM 50 MG/5ML PO LIQD: 100.0000 mg | Freq: Two times a day (BID) | ORAL | Status: DC | PRN

## 2019-02-19 MED ORDER — FAMOTIDINE 40 MG/5ML PO SUSR
20.0000 mg | Freq: Every day | ORAL | Status: DC
  Administered 2019-02-20 – 2019-02-22 (×3): 20 mg via ORAL
  Filled 2019-02-19 (×3): qty 2.5

## 2019-02-19 MED ORDER — POTASSIUM CHLORIDE CRYS ER 20 MEQ PO TBCR
20.0000 meq | EXTENDED_RELEASE_TABLET | Freq: Once | ORAL | Status: AC
  Administered 2019-02-19: 17:00:00 20 meq via ORAL
  Filled 2019-02-19: qty 1

## 2019-02-19 MED ORDER — FUROSEMIDE 80 MG PO TABS
80.0000 mg | ORAL_TABLET | Freq: Two times a day (BID) | ORAL | Status: DC
  Administered 2019-02-19 – 2019-02-21 (×4): 80 mg via ORAL
  Filled 2019-02-19 (×2): qty 1
  Filled 2019-02-19: qty 4
  Filled 2019-02-19: qty 1

## 2019-02-19 MED ORDER — POTASSIUM CHLORIDE CRYS ER 10 MEQ PO TBCR
10.0000 meq | EXTENDED_RELEASE_TABLET | Freq: Once | ORAL | Status: AC
  Administered 2019-02-19: 10 meq via ORAL
  Filled 2019-02-19: qty 1

## 2019-02-19 MED ORDER — WARFARIN - PHARMACIST DOSING INPATIENT
Freq: Every day | Status: DC
  Administered 2019-02-19: 22:00:00

## 2019-02-19 MED ORDER — PRO-STAT SUGAR FREE PO LIQD: 30.0000 mL | Freq: Every day | ORAL | Status: DC

## 2019-02-19 MED ORDER — SENNOSIDES-DOCUSATE SODIUM 8.6-50 MG PO TABS: 1.0000 | ORAL_TABLET | Freq: Every evening | ORAL | Status: DC | PRN

## 2019-02-19 MED ORDER — POTASSIUM CHLORIDE 10 MEQ/100ML IV SOLN
10.0000 meq | INTRAVENOUS | Status: DC
  Administered 2019-02-19 (×3): 10 meq via INTRAVENOUS
  Filled 2019-02-19 (×3): qty 100

## 2019-02-19 MED ORDER — VITAL 1.5 CAL PO LIQD
1000.0000 mL | ORAL | Status: DC
  Filled 2019-02-19: qty 1000

## 2019-02-19 MED ORDER — ATORVASTATIN CALCIUM 40 MG PO TABS
40.0000 mg | ORAL_TABLET | Freq: Every day | ORAL | Status: DC
  Administered 2019-02-19 – 2019-02-22 (×4): 40 mg via ORAL
  Filled 2019-02-19 (×4): qty 1

## 2019-02-19 MED ORDER — WARFARIN SODIUM 5 MG PO TABS
5.0000 mg | ORAL_TABLET | Freq: Once | ORAL | Status: AC
  Administered 2019-02-19: 5 mg via ORAL
  Filled 2019-02-19: qty 1

## 2019-02-19 MED ORDER — METOPROLOL TARTRATE 25 MG PO TABS
25.0000 mg | ORAL_TABLET | Freq: Two times a day (BID) | ORAL | Status: DC
  Administered 2019-02-19 – 2019-02-24 (×10): 25 mg via ORAL
  Filled 2019-02-19 (×10): qty 1

## 2019-02-19 NOTE — Progress Notes (Signed)
Pt arrived to unit, aox4

## 2019-02-19 NOTE — Progress Notes (Signed)
ANTICOAGULATION CONSULT NOTE   Pharmacy Consult for heparin Indication: atrial fibrillation and mechanical valve  Allergies  Allergen Reactions  . Codeine Nausea And Vomiting    Patient Measurements: Height: 5\' 1"  (154.9 cm) Weight: 130 lb 1.1 oz (59 kg) IBW/kg (Calculated) : 47.8 Heparin Dosing Weight: 59kg  Vital Signs: Temp: 98.1 F (36.7 C) (05/31 0300) Temp Source: Axillary (05/31 0300) BP: 116/69 (05/31 0500) Pulse Rate: 60 (05/31 0500)  Labs: Recent Labs    02/17/19 1132 02/17/19 1148 02/17/19 1544 02/18/19 0314 02/18/19 1904 02/19/19 0358  HGB 9.4* 11.2* 10.5* 8.8* 8.8*  --   HCT 29.9* 33.0* 31.0* 25.9* 26.0*  --   PLT 143*  --   --  175  --   --   APTT 39*  --   --   --   --   --   LABPROT 21.9*  --   --   --   --   --   INR 1.9*  --   --   --   --   --   HEPARINUNFRC  --   --   --   --   --  <0.10*  CREATININE 1.38* 1.40*  --  1.29*  --  1.31*    Estimated Creatinine Clearance: 29.7 mL/min (A) (by C-G formula based on SCr of 1.31 mg/dL (H)).   Medical History: Past Medical History:  Diagnosis Date  . Anticoagulated on Coumadin    for mech valve and atrial fib  goal 2.0-2.5  . Anxiety   . Arthritis    "right shoulder" (03/15/2013)  . Atrial fibrillation ([place])   . Atrial flutter ([place])   . CAD (coronary artery disease) 08/11/2016  . CHF (congestive heart failure) ([place])   . Chronic combined systolic and diastolic CHF (congestive heart failure) ([place])    a. 02/2016 Echo: EF 45-50%.  . Diverticula of colon   . Exertional shortness of breath   . GERD (gastroesophageal reflux disease)   . Heart murmur   . Hemorrhage intraabdominal 03/17/2012  . History of blood transfusion    "once" (03/15/2013)  . Hyperlipidemia   . Hypertension   . Liver hemorrhage   . Migraines   . 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 Bi-leaflet [employer] mechanical prosthesis; b. 02/2016 Echo: EF 45-50%, some degree of MR, sev dil LA/RA, sev TR.  . NSTEMI (non-ST  elevated myocardial infarction) ([place])    a. 02/2016 elev trop/Cath: nonobs dzs,   . Pacemaker    medtronic adapta  . Pneumonia 2009   resolved.? OPD Rx  . Severe tricuspid regurgitation    a. 02/2016 Echo: Ef 45-50%, sev TR, PASP 58mmHg.  . Sick sinus syndrome ([place])    Dr Cristopher [place]. EP study negative. Pacemaker 12/15/06 Medtronic  . Stroke [facility])    "they say I had a stroke last year" denies residual on 03/15/2013  . Subdural hematoma ([facility])     Medications:  Infusions:  . sodium chloride 40 mL/hr at 02/19/19 0500  . clevidipine Stopped (02/17/19 1959)  . diltiazem (CARDIZEM) infusion    . heparin 800 Units/hr (02/19/19 0500)  . phenylephrine (NEO-SYNEPHRINE) Adult infusion 10 mcg/min (02/18/19 1700)  . propofol (DIPRIVAN) infusion Stopped (02/18/19 0810)    Assessment: 16 yof s/p MCA M1 occlusion s/p IR revascularization. She is on chronic warfarin for afib and mechanical valve. INR yesterday was subtherapeutic at 1.9. To start IV heparin.  Heparin level this am <0.1 units/ml  Goal of Therapy:  Heparin level 0.3-0.7 units/ml Monitor platelets by anticoagulation protocol: Yes   Plan:  Increase Heparin gtt 1000 units/hr - NO BOLUSES Check an 6 hr heparin level Daily heparin level and CBC  Xzavien Harada Poteet 02/19/2019,5:48 AM

## 2019-02-19 NOTE — Progress Notes (Signed)
STROKE TEAM PROGRESS NOTE   SUBJECTIVE (INTERVAL HISTORY) No family is at the bedside.  She is sitting in chair, extubated this am, tolerating well. Slow talking but AAOx3 and no focal deficit. On heparin IV, will restart coumadin.   OBJECTIVE Vitals:   02/19/19 0500 02/19/19 0600 02/19/19 0700 02/19/19 0800  BP: 116/69 102/62 (!) 131/56 121/81  Pulse: 60 (!) 53 64 (!) 59  Resp: (!) 26 (!) 27 (!) 26 (!) 22  Temp:    98 F (36.7 C)  TempSrc:    Axillary  SpO2: 100% 98% 100% 100%  Weight:      Height:        CBC:  Recent Labs  Lab 02/17/19 1132  02/18/19 0314 02/18/19 1904 02/19/19 0358  WBC 3.7*  --  7.5  --  7.6  NEUTROABS 2.3  --  5.4  --   --   HGB 9.4*   < > 8.8* 8.8* 9.1*  HCT 29.9*   < > 25.9* 26.0* 26.7*  MCV 93.4  --  85.5  --  85.6  PLT 143*  --  175  --  170   < > = values in this interval not displayed.    Basic Metabolic Panel:  Recent Labs  Lab 02/17/19 1500  02/18/19 0314 02/19/19 0358  NA  --    < > 139 141  K  --    < > 3.5 2.8*  CL  --   --  109 109  CO2  --   --  18* 19*  GLUCOSE  --   --  84 113*  BUN  --   --  30* 25*  CREATININE  --   --  1.29* 1.31*  CALCIUM  --   --  9.0 9.1  MG 2.4  --   --   --    < > = values in this interval not displayed.    Lipid Panel:     Component Value Date/Time   CHOL 81 02/18/2019 0314   TRIG 34 02/19/2019 0358   HDL 28 (L) 02/18/2019 0314   CHOLHDL 2.9 02/18/2019 0314   VLDL 12 02/18/2019 0314   LDLCALC 41 02/18/2019 0314   HgbA1c:  Lab Results  Component Value Date   HGBA1C 5.2 02/18/2019   Urine Drug Screen:     Component Value Date/Time   LABOPIA NONE DETECTED 02/17/2019 1500   COCAINSCRNUR NONE DETECTED 02/17/2019 1500   LABBENZ NONE DETECTED 02/17/2019 1500   AMPHETMU NONE DETECTED 02/17/2019 1500   THCU NONE DETECTED 02/17/2019 1500   LABBARB NONE DETECTED 02/17/2019 1500    Alcohol Level     Component Value Date/Time   ETH <10 02/17/2019 1132    IMAGING  Ct Angio Head W Or  Wo Contrast Ct Angio Neck W Or Wo Contrast 02/17/2019 IMPRESSION:  1. Occlusion right M1 segment which appears acute. Delayed collateral flow to right M2 branches.  2. No significant carotid or vertebral artery stenosis in the neck.  3. Marked enlargement of the superior ophthalmic vein bilaterally. No evidence of carotid cavernous fistula. Marked dilatation of the jugular veins bilaterally is chronic and likely due to elevated right heart pressures. The patient has history of cardiac valvular disease.   Repeat CT Head WO Contrast Result Date: 02/18/2019 CLINICAL DATA:  Acute stroke, follow-up EXAM: CT HEAD WITHOUT CONTRAST TECHNIQUE: Contiguous axial images were obtained from the base of the skull through the vertex without intravenous contrast. COMPARISON:  02/17/2019 FINDINGS:  Brain: Stable atrophy and chronic white matter microvascular changes. No acute intracranial hemorrhage, definite new infarction, mass lesion, midline shift, herniation, hydrocephalus, or extra-axial fluid collection. No focal mass effect or edema. Gray-white matter differentiation maintained. Cisterns are patent. Cerebellar atrophy as well. Vascular: Intracranial atherosclerosis.  No hyperdense vessel. Skull: Normal. Negative for fracture or focal lesion. Sinuses/Orbits: Scattered sinus air-fluid levels may be related to recent intubation and NG tube. Mastoids are clear. Orbits unremarkable. Other: None. IMPRESSION: No acute intracranial abnormality by noncontrast CT. Stable atrophy and chronic white matter microvascular changes. Electronically Signed   By: Jerilynn Mages.  Shick M.D.   On: 02/18/2019 18:00   Ct Head Wo Contrast 02/17/2019 IMPRESSION:  No acute intracranial abnormality. Atrophy and chronic microvascular ischemic change.   Ct Head Code Stroke Wo Contrast 02/17/2019 IMPRESSION:  1. No acute intracranial abnormality  2. ASPECTS is 10   Transthoracic Echocardiogram  02/03/2019 IMPRESSIONS  1. The left ventricle has  normal systolic function with an ejection fraction of 60-65%. The cavity size was normal. There is moderately increased left ventricular wall thickness. Left ventricular diastolic function could not be evaluated due to mitral valve replacement/repair. There is abnormal septal motion consistent with RV pacemaker and right ventricular volume and pressure overload.  2. The right ventricle has moderately reduced systolic function. The cavity was moderately enlarged. There is no increase in right ventricular wall thickness.  3. Left atrial size was severely dilated.  4. Right atrial size was severely dilated.  5. A MVR mechanical valve is present in the mitral position.  6. The tricuspid valve is grossly normal. Tricuspid valve regurgitation is moderate-severe.  7. The aortic valve is tricuspid. Mild calcification of the aortic valve. Aortic valve regurgitation is mild by color flow Doppler. Mild stenosis of the aortic valve.  8. The inferior vena cava was dilated in size with <50% respiratory variability.  9. The interatrial septum was not well visualized. 10. When compared to the prior study: 03/31/2018: LVEF 60-65%, RVSP 66 mmHg.   EKG - atrial fibrillation - ventricular response 68 BPM (See cardiology reading for complete details)   PHYSICAL EXAM  Temp:  [98 F (36.7 C)-99.9 F (37.7 C)] 98 F (36.7 C) (05/31 0800) Pulse Rate:  [41-114] 59 (05/31 0800) Resp:  [18-34] 22 (05/31 0800) BP: (102-175)/(47-133) 121/81 (05/31 0800) SpO2:  [97 %-100 %] 100 % (05/31 0800) FiO2 (%):  [40 %] 40 % (05/31 0800)  General - Well nourished, well developed, intubated on low dose sedation.  Ophthalmologic - fundi not visualized due to noncooperation.  Cardiovascular - irregularly irregular heart rate and rhythm.  Neuro - intubated on low dose propofol, but awake alert and eyes open, and following simple commands. PERRL, EOMI, no gaze preference, visual field full, tracking bilaterally. Corneal reflex  present, gag and cough present. Breathing over the vent.  Facial symmetry not able to test due to ET tube.  Tongue midline on protrusion. BUE 4/5 and BLE 3+/5. DTR 1+ and no babinski. Sensation symmetrical, coordination seems to have Left FTN mild dysmetria and gait not tested.   ASSESSMENT/PLAN Ms. ADASYN MCADAMS is a 78 y.o. female with history of a-fib, mechanincal heart valve, warfarin therapy, pacemaker, prior SDH, CAD, CHF, HLD, HTN, NSTEMI in 2017 and prior stroke presenting after she collapsed with right sided gaze preference, left facial droop and left hemiplegia. She did not receive IV t-PA due to warfarin anticoagulation with INR 1.9.  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 fib with subtherapeutic INR  Resultant no focal deficit  CT head - No acute intracranial abnormality   Repeat Head CT -no acute intracranial abnormality  MRI head - PPM not compatible with MRI  CTA H&N - Occlusion right M1 segment which appears acute.   IR with TICI3 reperfusion  2D Echo - EF 60 - 65%. No cardiac source of emboli identified.   [employer] Virus 2  - negative  LDL - 41  HgbA1c - 5.2  INR 1.9 on admission  UDS  - negative  VTE prophylaxis - SCDs  warfarin daily prior to admission, now on Heparin IV for mechanical valve and atrial fibrillation, will start coumadin today  Ongoing aggressive stroke risk factor management  Therapy recommendations:  pending  Disposition:  Pending  Mechanical mitral valve  Followed by cardiology  On Coumadin PTA  INR 1.9 on admission  INR goal 2.0-2.5 due to history of SDH and liver hemorrhage  Currently on heparin IV, will start coumadin today  Chronic A. 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  Also found to have small SAH suspected posttraumatic versus septic emboli, which subsequently resolved, put on heparin drip bridged to Coumadin.   Respiratory failure, resolved  Was intubated for procedure  Now extubated  CCM on board  Tolerating well  Hypotension History of hypertension  BP stable . Currently off neo . BP goal normotensive . IV fluid @ 40  Hyperlipidemia  Lipid lowering medication PTA:  Lipitor 40 mg daily  LDL 41, goal < 70  Current lipid lowering medication:  Lipitor 40 mg daily  Continue statin at discharge  Anemia  Hemoglobin 9.4-10.5-8.8->9.1  Currently on IV heparin and coumadin  close CBC monitoring  PRBC transfusion if Hb < 7  Dysphagia   Extubated 5/31  Speech following  Has not passed swallow yet  Continue IVF for now  Other Stroke Risk Factors  Advanced age  Family hx stroke (father and brother)  Coronary artery disease, non-STEMI 2017  Migraines  CHF  Other Active Problems  CKD stage III creatinine - 1.38->1.40->1.29->1.31  Pacemaker  History of SDH  Hypokalemia - 3.5->2.8 - supplemented  Hospital day # 2  This patient is critically ill due to right MCA stroke status post IR, mechanical valve, chronic A. fib, chronic anticoagulation, anemia, respiratory failure and at significant risk of neurological worsening, death form recurrent stroke, hemorrhagic conversion, heart failure, hemorrhagic shock, anemia. This patient's care requires constant monitoring of vital signs, hemodynamics, respiratory and cardiac monitoring, review of multiple databases, neurological assessment, discussion with family, other specialists and medical decision making of high complexity. I spent 30 minutes of neurocritical care time in the care of this patient.  Rosalin Hawking, MD PhD Stroke Neurology 02/19/2019 10:51 AM  To contact  Stroke Continuity provider, please refer to [email or web address] After hours, contact General Neurology

## 2019-02-19 NOTE — Progress Notes (Signed)
PT Cancellation Note  Patient Details Name: Carrie [name] MRN: [id number] DOB: 1941-04-20   Cancelled Treatment:    Reason Eval/Treat Not Completed: Patient not medically ready   Noted she remains intubated at this time; Would also need bedrest order lifted to proceed with PT evaluation;   Will follow for appropriateness of PT eval;   Roney Marion, [place] Pager [phone] Office [phone]    [place] 02/19/2019, 8:19 AM

## 2019-02-19 NOTE — Progress Notes (Signed)
[place] for heparin & warfarin Indication: atrial fibrillation and mechanical valve  Allergies  Allergen Reactions  . Codeine Nausea And Vomiting    Patient Measurements: Height: 5\' 1"  (154.9 cm) Weight: 130 lb 1.1 oz (59 kg) IBW/kg (Calculated) : 47.8 Heparin Dosing Weight: 59kg  Vital Signs: Temp: 98 F (36.7 C) (05/31 0800) Temp Source: Axillary (05/31 0800) BP: 121/81 (05/31 0836) Pulse Rate: 97 (05/31 0836)  Labs: Recent Labs    02/17/19 1132 02/17/19 1148  02/18/19 0314 02/18/19 1904 02/19/19 0358 02/19/19 1132  HGB 9.4* 11.2*   < > 8.8* 8.8* 9.1*  --   HCT 29.9* 33.0*   < > 25.9* 26.0* 26.7*  --   PLT 143*  --   --  175  --  170  --   APTT 39*  --   --   --   --   --   --   LABPROT 21.9*  --   --   --   --   --   --   INR 1.9*  --   --   --   --   --   --   HEPARINUNFRC  --   --   --   --   --  <0.10* 0.25*  CREATININE 1.38* 1.40*  --  1.29*  --  1.31*  --    < > = values in this interval not displayed.    Estimated Creatinine Clearance: 29.7 mL/min (A) (by C-G formula based on SCr of 1.31 mg/dL (H)).   Medical History: Past Medical History:  Diagnosis Date  . Anticoagulated on Coumadin    for mech valve and atrial fib  goal 2.0-2.5  . Anxiety   . Arthritis    "right shoulder" (03/15/2013)  . Atrial fibrillation ([place])   . Atrial flutter ([place])   . CAD (coronary artery disease) 08/11/2016  . CHF (congestive heart failure) ([place])   . Chronic combined systolic and diastolic CHF (congestive heart failure) ([place])    a. 02/2016 Echo: EF 45-50%.  . Diverticula of colon   . Exertional shortness of breath   . GERD (gastroesophageal reflux disease)   . Heart murmur   . Hemorrhage intraabdominal 03/17/2012  . History of blood transfusion    "once" (03/15/2013)  . Hyperlipidemia   . Hypertension   . Liver hemorrhage   . Migraines   . 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 Bi-leaflet St. Jude mechanical  prosthesis; b. 02/2016 Echo: EF 45-50%, some degree of MR, sev dil LA/RA, sev TR.  . NSTEMI (non-ST elevated myocardial infarction) ([place])    a. 02/2016 elev trop/Cath: nonobs dzs,   . Pacemaker    medtronic adapta  . Pneumonia 2009   resolved.? OPD Rx  . Severe tricuspid regurgitation    a. 02/2016 Echo: Ef 45-50%, sev TR, PASP 84mmHg.  . Sick sinus syndrome ([place])    Dr Cristopher [place]. EP study negative. Pacemaker 12/15/06 Medtronic  . Stroke [facility])    "they say I had a stroke last year" denies residual on 03/15/2013  . Subdural hematoma ([facility])     Medications:  Infusions:  . clevidipine Stopped (02/17/19 1959)  . diltiazem (CARDIZEM) infusion    . heparin 1,000 Units/hr (02/19/19 1829)  . phenylephrine (NEO-SYNEPHRINE) Adult infusion 10 mcg/min (02/18/19 1700)  . potassium chloride 10 mEq (02/19/19 1222)  . propofol (DIPRIVAN) infusion Stopped (02/18/19 0810)    Assessment: 62 yof s/p MCA M1  occlusion s/p IR revascularization. She is on chronic warfarin for afib and mechanical valve. INR on admission was subtherapeutic at 1.9. She is being bridged with heparin and will restart Coumadin tonight. Heparin level remains slightly subtherapeutic at 0.25 on 1000 units/hr.  Goal of Therapy:  Heparin level 0.3-0.5 units/ml INR 2-2.5 Monitor platelets by anticoagulation protocol: Yes   Plan:  Increase Heparin gtt to 1100 units/hr - NO BOLUSES Check heparin level in 6 hours  Daily heparin level and CBC Coumadin 5mg  today - her usual Sunday dose Daily PT/INR  Legrand Como, Pharm.D., BCPS, BCIDP Clinical Pharmacist Phone: [phone] Please check AMION for all [place] numbers 02/19/2019, 12:59 PM

## 2019-02-19 NOTE — Procedures (Signed)
Extubation Procedure Note  Patient Details:   Name: Carrie [name] DOB: Feb 01, 1941 MRN: [id number]   Airway Documentation:    Vent end date: 02/19/19 Vent end time: 0833   Evaluation  O2 sats: stable throughout Complications: No apparent complications Patient did tolerate procedure well. Bilateral Breath Sounds: Clear, Diminished   Yes   Positive cuff leak noted. Patient placed on [place] 3 L with humidity, no stridor noted. Patient able to reach 325 mL using the incentive spirometer.  Bayard Beaver 02/19/2019, 8:39 AM

## 2019-02-19 NOTE — Progress Notes (Signed)
ANTICOAGULATION CONSULT NOTE   Pharmacy Consult for heparin Indication: atrial fibrillation and mechanical valve  Allergies  Allergen Reactions  . Codeine Nausea And Vomiting    Patient Measurements: Height: 5\' 1"  (154.9 cm) Weight: 130 lb 1.1 oz (59 kg) IBW/kg (Calculated) : 47.8 Heparin Dosing Weight: 59kg  Vital Signs: Temp: 97.8 F (36.6 C) (05/31 1943) Temp Source: Oral (05/31 1943) BP: 125/69 (05/31 1943) Pulse Rate: 76 (05/31 1943)  Labs: Recent Labs    02/17/19 1132 02/17/19 1148  02/18/19 0314 02/18/19 1904 02/19/19 0358 02/19/19 1132 02/19/19 1935  HGB 9.4* 11.2*   < > 8.8* 8.8* 9.1*  --   --   HCT 29.9* 33.0*   < > 25.9* 26.0* 26.7*  --   --   PLT 143*  --   --  175  --  170  --   --   APTT 39*  --   --   --   --   --   --   --   LABPROT 21.9*  --   --   --   --   --   --   --   INR 1.9*  --   --   --   --   --   --   --   HEPARINUNFRC  --   --   --   --   --  <0.10* 0.25* 0.29*  CREATININE 1.38* 1.40*  --  1.29*  --  1.31*  --   --    < > = values in this interval not displayed.    Estimated Creatinine Clearance: 29.7 mL/min (A) (by C-G formula based on SCr of 1.31 mg/dL (H)).  Medications:  Infusions:  . heparin 1,100 Units/hr (02/19/19 1500)    Assessment: 37 yof s/p MCA M1 occlusion s/p IR revascularization. She is on chronic warfarin for afib and mechanical valve. INR on admission was subtherapeutic at 1.9 so she was initiated on IV heparin. Heparin level remains slightly subtherapeutic at 0.29.   Goal of Therapy:  Heparin level 0.3-0.5 units/ml INR 2-2.5 Monitor platelets by anticoagulation protocol: Yes   Plan:  Increase Heparin gtt to 1200 units/hr - NO BOLUSES F/u AM heparin level Daily heparin level and CBC  Salome Arnt, PharmD, BCPS Please see AMION for all pharmacy numbers 02/19/2019 8:35 PM

## 2019-02-19 NOTE — Evaluation (Signed)
Physical Therapy Evaluation Patient Details Name: Carrie [name] MRN: [id number] DOB: Feb 08, 1941 Today's Date: 02/19/2019   History of Present Illness  [name] who presented [facility] with left sided weakness, facial droop and slurred speech. 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 . The patient was taken to interventional radiology per Dr. 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 The patient was returned to ICU on mechanical ventilation, extubated 5/31  Clinical Impression   Pt admitted with above diagnosis. Pt currently with functional limitations due to the deficits listed below (see PT Problem List). Walking with cane/RW prior to admission; Presents with decr functional mobility, decr balance, incr fall risk;  Pt will benefit from skilled PT to increase their independence and safety with mobility to allow discharge to the venue listed below.       Follow Up Recommendations CIR    Equipment Recommendations  Rolling walker with 5" wheels    Recommendations for Other Services Rehab consult     Precautions / Restrictions Precautions Precautions: Fall Precaution Comments: bil LE edema Restrictions Weight Bearing Restrictions: No      Mobility  Bed Mobility Overal bed mobility: Needs Assistance Bed Mobility: Supine to Sit     Supine to sit: Min assist     General bed mobility comments: Cues for technqiue, and min assist to elevate trunk to sit  Transfers Overall transfer level: Needs assistance Equipment used: Rolling walker (2 wheeled) Transfers: Sit to/from Stand Sit to Stand: Min assist Stand pivot transfers: Mod assist;+2 safety/equipment       General transfer comment: Min assist to rise; Mod assist to keep balance with more dynamic acitivty of pivotal steps bed to chair  Ambulation/Gait                 Stairs            Wheelchair Mobility    Modified Rankin (Stroke Patients Only) Modified Rankin (Stroke Patients Only) Pre-Morbid Rankin Score: Slight disability Modified Rankin: Moderately severe disability     Balance     Sitting balance-Leahy Scale: Fair       Standing balance-Leahy Scale: Poor                               Pertinent Vitals/Pain Pain Assessment: No/denies pain    Home Living Family/patient expects to be discharged to:: Private residence Living Arrangements: Spouse/significant other Available Help at Discharge: Family;Available 24 hours/day Type of Home: House Home Access: Stairs to enter Entrance Stairs-Rails: None Entrance Stairs-Number of Steps: 1 Home Layout: Multi-level Home Equipment: Cane - single point;Walker - 4 wheels;Bedside commode;Grab bars - tub/shower      Prior Function Level of Independence: Needs assistance   Gait / Transfers Assistance Needed: use of SPC for mobility  ADL's / Homemaking Assistance Needed: husband would assist with ADLs and mobility  Comments: History of a fall down her steps one month ago     Hand Dominance   Dominant Hand: Right    Extremity/Trunk Assessment   Upper Extremity Assessment Upper Extremity Assessment: Defer to OT evaluation    Lower Extremity Assessment Lower Extremity Assessment: Generalized weakness    Cervical / Trunk Assessment Cervical / Trunk Assessment: Normal  Communication   Communication: HOH  Cognition Arousal/Alertness: Awake/alert Behavior  During Therapy: WFL for tasks assessed/performed Overall Cognitive Status: Within Functional Limits for tasks assessed                                        General Comments General comments (skin integrity, edema, etc.): VSS throughout    Exercises     Assessment/Plan    PT Assessment Patient needs continued PT services  PT Problem List Decreased strength;Decreased  activity tolerance;Decreased balance;Decreased mobility;Decreased knowledge of use of DME;Decreased safety awareness       PT Treatment Interventions DME instruction;Gait training;Stair training;Functional mobility training;Therapeutic activities;Therapeutic exercise;Balance training;Patient/family education    PT Goals (Current goals can be found in the Care Plan section)  Acute Rehab PT Goals Patient Stated Goal: Agreeable to getting OOB PT Goal Formulation: With patient Time For Goal Achievement: 03/05/19 Potential to Achieve Goals: Good    Frequency Min 4X/week   Barriers to discharge Decreased caregiver support Husband recently had surgery; Nephew is helping them     Co-evaluation               AM-PAC PT "6 Clicks" Mobility  Outcome Measure Help needed turning from your back to your side while in a flat bed without using bedrails?: A Little Help needed moving from lying on your back to sitting on the side of a flat bed without using bedrails?: None Help needed moving to and from a bed to a chair (including a wheelchair)?: A Lot Help needed standing up from a chair using your arms (e.g., wheelchair or bedside chair)?: A Lot Help needed to walk in hospital room?: A Lot Help needed climbing 3-5 steps with a railing? : A Lot 6 Click Score: 15    End of Session Equipment Utilized During Treatment: Gait belt Activity Tolerance: Patient tolerated treatment well Patient left: in chair;with call bell/phone within reach;with chair alarm set Nurse Communication: Mobility status PT Visit Diagnosis: Unsteadiness on feet (R26.81);Other abnormalities of gait and mobility (R26.89);Muscle weakness (generalized) (M62.81)    Time: 8889-1694 PT Time Calculation (min) (ACUTE ONLY): 25 min   Charges:   PT Evaluation $PT Eval Moderate Complexity: 1 Mod PT Treatments $Therapeutic Activity: 8-22 mins        Roney Marion, PT  Acute Rehabilitation Services Pager  [phone] Office [place] 02/19/2019, 3:52 PM

## 2019-02-19 NOTE — Progress Notes (Signed)
Rehab Admissions Coordinator Note:  Per PT recommendation, this patient was screened by Jhonnie Garner for appropriateness for an Inpatient Acute Rehab Consult.  At this time, we are recommending an Inpatient Rehab consult. AC to contact MD to request order.   Jhonnie Garner 02/19/2019, 5:12 PM  I can be reached at [phone].

## 2019-02-19 NOTE — Progress Notes (Signed)
NAME:  Carrie [name], MRN:  [id number], DOB:  1941-01-10, LOS: 2 ADMISSION DATE:  02/17/2019, CONSULTATION DATE:  02/17/2019 REFERRING MD:  Dr. Estanislado Pandy, CHIEF COMPLAINT:  AMS   Brief History   [name] who presented to [facility] 5/29 with left sided weakness, facial droop and slurred speech.  Work up consistent with large vessel occlusion and patient taken emergently to neuro IR for revascularization.  History of present illness   [name] who presented to [facility] 5/29 with left sided weakness, facial droop and slurred speech. The patient was evaluated for CVA with CT Head which revealed no acute abnormality.  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  INR was elevated and she was not a candidate for tPA.  The patient was taken to interventional radiology per Dr. 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  The patient was returned to ICU on mechanical ventilation.    PCCM consulted for assistance with post-operative ICU management.    Past Medical History  SSS s/p Pacemker Severe TR  Rheumatic Mitral Valve Disease  HTN  HLD NSTEMI  CHF - chronic combined  CAD  AF - on anticoagulation  SDH   Significant Hospital Events   5/29-right M1 occlusion-->thrombectomy  Consults:  PCCM  Procedures:  5/29 ETT   Significant Diagnostic Tests:  5/29 CT Head CODE Stroke >> no acute abnormality  5/29 CTA Head / Neck >> acute occlusion of the right M1 segment, delayed collateral flow to the R M2 branches.  Marked dilatation of the jugular veins bilaterally likely due to elevated right heart pressures.    Micro Data:  COVID negative 5/29  Antimicrobials:  n/a   Interim history/subjective:   She is awake, interactive, following commands, answers questions appropriately. Moving all of her extremities, and demonstrating good strength on  her left side.  Vent weaning yesterday. Still continues to be somewhat tachypneic, but when asked can achieve TV >500cc Objective   Blood pressure (!) 131/56, pulse 64, temperature 98.1 F (36.7 C), temperature source Axillary, resp. rate (!) 26, height 5\' 1"  (1.549 m), weight 59 kg, SpO2 100 %.    Vent Mode: PRVC FiO2 (%):  [40 %] 40 % Set Rate:  [10 bmp] 10 bmp Vt Set:  [380 mL] 380 mL PEEP:  [5 cmH20] 5 cmH20 Pressure Support:  [16 cmH20] 16 cmH20 Plateau Pressure:  [17 cmH20-19 cmH20] 18 cmH20   Intake/Output Summary (Last 24 hours) at 02/19/2019 0823 Last data filed at 02/19/2019 0700 Gross per 24 hour  Intake 1776.27 ml  Output 2675 ml  Net -898.73 ml   Filed Weights   02/17/19 1100  Weight: 59 kg    Examination: General: NAD HENT: AT/[place], tongue midline Lungs: CTAB Cardiovascular: sinus Abdomen: SND Extremities: no edema, 2+ pulses radial, DP Neuro: CN II-XII grossly intact to confrontation. Responds appropriately to Y/N questions, MAE on request GU: foley in place, urine clear  Resolved Hospital Problem list     Assessment & Plan:  Right M1 Occlusion / CVA -s/p revascularization of right common carotid  Hx SDH  P: SBP goal 120-140; pressors off Further neuro imaging per Stroke Service  PAD protocol for sedation, RASS Goal 0 to -1   Acute Respiratory Insufficiency requiring Mechanical Ventilation  -in setting of CVA P:  trial extubation this a.m.    AKI  P: Trend BMP / urinary output Replace electrolytes as indicated Avoid nephrotoxic agents, ensure adequate renal perfusion  Chronic Combined CHF  SSS s/p Pacemaker  Hx Bioprosthetic MV AF - on coumadin P: Restart diuretics in am  Hold coumadin  Defer ASA decision to Neurology  Continue lopressor for rhythm stabilization  Lipitor   Anemia P: Trend CBC Hgb today 9.1 Transfuse per ICU guidelines   Hypothermia  P: normalized   Best practice:  Diet: NPO, TF if reintubate  Pain/Anxiety/Delirium protocol (if indicated): n/a VAP protocol (if indicated): in place; d/c after extubation DVT prophylaxis: SCDs GI prophylaxis: PPI, HOB >30 Glucose control: monitor Mobility: bed Code Status: full Family Communication: will update today Disposition: 4N ICU  Labs   CBC: Recent Labs  Lab 02/17/19 1132 02/17/19 1148 02/17/19 1544 02/18/19 0314 02/18/19 1904 02/19/19 0358  WBC 3.7*  --   --  7.5  --  7.6  NEUTROABS 2.3  --   --  5.4  --   --   HGB 9.4* 11.2* 10.5* 8.8* 8.8* 9.1*  HCT 29.9* 33.0* 31.0* 25.9* 26.0* 26.7*  MCV 93.4  --   --  85.5  --  85.6  PLT 143*  --   --  175  --  518    Basic Metabolic Panel: Recent Labs  Lab 02/17/19 1132 02/17/19 1148 02/17/19 1500 02/17/19 1544 02/18/19 0314 02/19/19 0358  NA 137 140  --  140 139 141  K 4.9 4.9  --  3.9 3.5 2.8*  CL 107 109  --   --  109 109  CO2 20*  --   --   --  18* 19*  GLUCOSE 94 89  --   --  84 113*  BUN 41* 46*  --   --  30* 25*  CREATININE 1.38* 1.40*  --   --  1.29* 1.31*  CALCIUM 9.6  --   --   --  9.0 9.1  MG  --   --  2.4  --   --   --    GFR: Estimated Creatinine Clearance: 29.7 mL/min (A) (by C-G formula based on SCr of 1.31 mg/dL (H)). Recent Labs  Lab 02/17/19 1132 02/18/19 0314 02/19/19 0358  WBC 3.7* 7.5 7.6    Liver Function Tests: Recent Labs  Lab 02/17/19 1132  AST 98*  ALT 23  ALKPHOS 129*  BILITOT 1.2  PROT 8.0  ALBUMIN 3.3*   No results for input(s): LIPASE, AMYLASE in the last 168 hours. No results for input(s): AMMONIA in the last 168 hours.  ABG    Component Value Date/Time   PHART 7.553 (H) 02/17/2019 1544   PCO2ART 23.0 (L) 02/17/2019 1544   PO2ART 130.0 (H) 02/17/2019 1544   HCO3 20.7 02/17/2019 1544   TCO2 21 (L) 02/17/2019 1544   ACIDBASEDEF 1.0 02/17/2019 1544   O2SAT 99.0 02/17/2019 1544     Coagulation Profile: Recent Labs  Lab 02/14/19 1306 02/17/19 1132  INR 1.6* 1.9*    Cardiac Enzymes: No results for input(s):  CKTOTAL, CKMB, CKMBINDEX, TROPONINI in the last 168 hours.  HbA1C: Hgb A1c MFr Bld  Date/Time Value Ref Range Status  02/18/2019 03:14 AM 5.2 4.8 - 5.6 % Final    Comment:    (NOTE) Pre diabetes:          5.7%-6.4% Diabetes:              >6.4% Glycemic control for   <7.0% adults with diabetes  03/31/2018 01:12 AM 4.7 (L) 4.8 - 5.6 % Final    Comment:    (NOTE) Pre diabetes:          5.7%-6.4% Diabetes:              >6.4% Glycemic control for   <7.0% adults with diabetes     CBG: Recent Labs  Lab 02/18/19 1637 02/18/19 1955 02/18/19 2310 02/19/19 0317 02/19/19 0747  GLUCAP 92 86 97 104* 99    Review of Systems:   Unable to obtain; pt intubated  Past Medical History  She,  has a past medical history of Anticoagulated on Coumadin, Anxiety, Arthritis, Atrial fibrillation ([place]), Atrial flutter ([place]), CAD (coronary artery disease) (08/11/2016), CHF (congestive heart failure) (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   Surgical History    Past Surgical History:  Procedure Laterality Date  . APPENDECTOMY    . CARDIAC CATHETERIZATION  04/02/97   R&L:severe MR/pulmonary hypertension  . CARDIAC CATHETERIZATION N/A 03/16/2016   Procedure: Left Heart Cath and Coronary Angiography;  Surgeon: [name], MD;  Location: [place] [facility];  Service: Cardiovascular;  Laterality: N/A;  . CARDIAC CATHETERIZATION N/A 09/30/2016   Procedure: Left Heart Cath and Coronary  Angiography;  Surgeon: [name], MD;  Location: [place] [facility];  Service: Cardiovascular;  Laterality: N/A;  . CARDIAC CATHETERIZATION N/A 09/30/2016   Procedure: Coronary Stent Intervention;  Surgeon: [name], MD;  3.5 x 12 mm resolute Onyx  to ostial RCA  . CATARACT EXTRACTION W/ INTRAOCULAR LENS  IMPLANT, BILATERAL Bilateral 2013  . CORONARY ANGIOPLASTY    . DILATION AND CURETTAGE OF UTERUS     "had fibroids" (03/15/2013)  . EXPLORATORY LAPAROTOMY     "had a growth on my intestines" (03/15/2013)  . HAMMER TOE SURGERY Right   . INSERT / REPLACE / REMOVE PACEMAKER  12/15/2006   Medtronic  . MITRAL VALVE REPLACEMENT  1998   St Jude mechanical; Dr. [name]  . PACEMAKER PLACEMENT  12/15/06   medtronic adapta for SSS  . PERMANENT PACEMAKER GENERATOR CHANGE N/A 07/24/2014   Procedure: PERMANENT PACEMAKER GENERATOR CHANGE;  Surgeon: [name], MD;  Location: [place];  Service: Cardiovascular;  Laterality: N/A;  . PERSANTINE CARDIOLITE  08/07/03   mild inf. ischemia   . RIGHT HEART CATH N/A 02/06/2019   Procedure: RIGHT HEART CATH;  Surgeon: [name], MD;  Location: [place] [facility];  Service: Cardiovascular;  Laterality: N/A;  . TEE WITHOUT CARDIOVERSION  09/23/2011   Procedure: TRANSESOPHAGEAL ECHOCARDIOGRAM (TEE);  Surgeon: [name];  Location: [facility];  Service: Cardiovascular;  Laterality: N/A;  . TONSILLECTOMY    . TUBAL LIGATION    . US ECHOCARDIOGRAPHY  11/19/2011   EF 50-55%,RA mod to severely dilated,LA severely dilated,trace MR,small vegetation or mass on the MV,AOV mildly scleroticmild PI, RV pressure 40-19mmHg     Social History   reports that she has never smoked. She has never used smokeless tobacco. She reports that she does not drink alcohol or use drugs.   Family History   Her family history includes Cancer in her mother and sister; Diabetes in her  sister; Healthy in her brother, brother, brother, and sister; Heart attack in  her maternal grandfather; Kidney disease in her daughter; Leukemia in her sister; Stroke in her brother and father.   Allergies Allergies  Allergen Reactions  . Codeine Nausea And Vomiting     Home Medications  Prior to Admission medications   Medication Sig Start Date End Date Taking? Authorizing Provider  atorvastatin (LIPITOR) 40 MG tablet TAKE 1 TABLET BY MOUTH EVERY DAY AT 6PM Patient taking differently: Take 40 mg by mouth daily at 6 PM.  09/27/18   Croitoru, Mihai, MD  Biotin 1000 MCG tablet Take 1,000 mcg by mouth daily.    [provider]  colchicine 0.6 MG tablet Take 1 tablet (0.6 mg total) by mouth daily for 4 days. 02/07/19 02/11/19  Kayleen Memos, DO  dextromethorphan-guaiFENesin (MUCINEX DM) 30-600 MG 12hr tablet Take 1 tablet by mouth 2 (two) times daily as needed for cough. 04/01/18   Hosie Poisson, MD  docusate sodium (COLACE) 100 MG capsule Take 200 mg by mouth 2 (two) times daily.     [provider]  furosemide (LASIX) 80 MG tablet Take 1 tablet (80 mg total) by mouth 2 (two) times daily. 02/07/19   Kayleen Memos, DO  metolazone (ZAROXOLYN) 2.5 MG tablet Take 2.5 mg when your weight is above 137 lbs as recommended by cardiology. 02/07/19   Kayleen Memos, DO  metoprolol tartrate (LOPRESSOR) 25 MG tablet Take 1 tablet (25 mg total) by mouth 2 (two) times daily. 11/10/16   Croitoru, Mihai, MD  Multiple Vitamin (MULTIVITAMINS PO) Take 1 tablet by mouth daily.     [provider]  nitroGLYCERIN (NITROSTAT) 0.4 MG SL tablet Place 1 tablet (0.4 mg total) under the tongue every 5 (five) minutes as needed for chest pain. 11/20/16   Croitoru, Mihai, MD  Omega-3 Fatty Acids (FISH OIL) 1000 MG CAPS Take 1,000 mg by mouth daily.    [provider]  oxymetazoline (AFRIN) 0.05 % nasal spray Place 1 spray into both nostrils 2 (two) times daily as needed for congestion. 02/07/19   Kayleen Memos, DO  polyethylene glycol (MIRALAX / GLYCOLAX) packet Take 17 g by  mouth as needed (for constipation). Reported on 03/25/2016    [provider]  polyvinyl alcohol (LIQUIFILM TEARS) 1.4 % ophthalmic solution Place 1 drop into the left eye as needed for dry eyes. 02/07/19   Kayleen Memos, DO  potassium chloride SA (K-DUR,KLOR-CON) 20 MEQ tablet Take 2 tablets (40 mEq total) by mouth every morning AND 1 tablet (20 mEq total) every evening. 06/01/18   Croitoru, Mihai, MD  spironolactone (ALDACTONE) 25 MG tablet Take 0.5 tablets (12.5 mg total) by mouth daily. 02/08/19   Kayleen Memos, DO  warfarin (COUMADIN) 5 MG tablet TAKE 1 TO 1 AND 1/2 TABLETS BY MOUTH DAILY AS DIRECTED Patient taking differently: Take 2.5-5 mg by mouth See admin instructions. Taking 1/2 tablet (2.5) mg on Monday and Thursday. All other days 5mg  tablet 12/15/18   Croitoru, Dani Gobble, MD     Critical care time: I have independently seen and examined the patient, reviewed data, and developed an assessment and plan. A total of 35 minutes were spent in critical care assessment and medical decision making. This critical care time does not reflect procedure time, or teaching time or supervisory time of PA/NP/Med student/Med Resident, etc but could involve care discussion time.     Bonna Gains, MD PhD 02/19/19 8:23 AM

## 2019-02-20 ENCOUNTER — Encounter (HOSPITAL_COMMUNITY): Payer: Self-pay | Admitting: Interventional Radiology

## 2019-02-20 ENCOUNTER — Inpatient Hospital Stay (HOSPITAL_COMMUNITY): Payer: Medicare Other

## 2019-02-20 DIAGNOSIS — R0602 Shortness of breath: Secondary | ICD-10-CM

## 2019-02-20 DIAGNOSIS — D638 Anemia in other chronic diseases classified elsewhere: Secondary | ICD-10-CM

## 2019-02-20 LAB — CBC
HCT: 26.1 % — ABNORMAL LOW (ref 36.0–46.0)
Hemoglobin: 8.6 g/dL — ABNORMAL LOW (ref 12.0–15.0)
MCH: 29.4 pg (ref 26.0–34.0)
MCHC: 33 g/dL (ref 30.0–36.0)
MCV: 89.1 fL (ref 80.0–100.0)
Platelets: 162 10*3/uL (ref 150–400)
RBC: 2.93 MIL/uL — ABNORMAL LOW (ref 3.87–5.11)
RDW: 25.4 % — ABNORMAL HIGH (ref 11.5–15.5)
WBC: 6.9 10*3/uL (ref 4.0–10.5)
nRBC: 0.3 % — ABNORMAL HIGH (ref 0.0–0.2)

## 2019-02-20 LAB — BASIC METABOLIC PANEL
Anion gap: 9 (ref 5–15)
BUN: 24 mg/dL — ABNORMAL HIGH (ref 8–23)
CO2: 22 mmol/L (ref 22–32)
Calcium: 9 mg/dL (ref 8.9–10.3)
Chloride: 106 mmol/L (ref 98–111)
Creatinine, Ser: 1.13 mg/dL — ABNORMAL HIGH (ref 0.44–1.00)
GFR calc Af Amer: 54 mL/min — ABNORMAL LOW (ref >60)
GFR calc non Af Amer: 47 mL/min — ABNORMAL LOW (ref >60)
Glucose, Bld: 92 mg/dL (ref 70–99)
Potassium: 3.1 mmol/L — ABNORMAL LOW (ref 3.5–5.1)
Sodium: 137 mmol/L (ref 135–145)

## 2019-02-20 LAB — PROTIME-INR
INR: 1.7 — ABNORMAL HIGH (ref 0.8–1.2)
Prothrombin Time: 20.1 seconds — ABNORMAL HIGH (ref 11.4–15.2)

## 2019-02-20 LAB — MAGNESIUM: Magnesium: 1.8 mg/dL (ref 1.7–2.4)

## 2019-02-20 LAB — HEPARIN LEVEL (UNFRACTIONATED): Heparin Unfractionated: 0.34 IU/mL (ref 0.30–0.70)

## 2019-02-20 MED ORDER — ENSURE ENLIVE PO LIQD
237.0000 mL | Freq: Two times a day (BID) | ORAL | Status: DC
  Administered 2019-02-20 – 2019-02-22 (×4): 237 mL via ORAL

## 2019-02-20 MED ORDER — POTASSIUM CHLORIDE CRYS ER 20 MEQ PO TBCR
40.0000 meq | EXTENDED_RELEASE_TABLET | ORAL | Status: AC
  Administered 2019-02-20 (×3): 40 meq via ORAL
  Filled 2019-02-20 (×3): qty 2

## 2019-02-20 MED ORDER — METOPROLOL TARTRATE 12.5 MG HALF TABLET
12.5000 mg | ORAL_TABLET | Freq: Once | ORAL | Status: AC
  Administered 2019-02-20: 12.5 mg via ORAL
  Filled 2019-02-20: qty 1

## 2019-02-20 NOTE — Progress Notes (Signed)
Nutrition Follow-up   RD working remotely.  DOCUMENTATION CODES:   Not applicable  INTERVENTION:  Provide Ensure Enlive po BID, each supplement provides 350 kcal and 20 grams of protein  Encourage adequate PO intake.   NUTRITION DIAGNOSIS:   Inadequate oral intake related to inability to eat as evidenced by NPO status; diet advanced; improving  GOAL:   Patient will meet greater than or equal to 90% of their needs  MONITOR:   PO intake, Supplement acceptance, Labs, Weight trends, I & O's, Skin  REASON FOR ASSESSMENT:   Ventilator    ASSESSMENT:    Carrie [name] is an 78 y.o. female who presents as a Code Stroke with sudden onset of right hemiplegia. Time of symptom onset was witnessed by family at 55. She got up from seated position, was able to walk a few steps, then collapsed. She has atrial fibrillation and a mechanical heart valve, anticoagulated with warfarin. Per EMS, she had an LVO score of 6 with findings including right sided weakness. On arrival to the ED she had right sided gaze preference, left facial droop and left hemiplegia. The patient was anxious and agitated but with no specific complaints.   Extubated 5/31. Diet has been advanced to a heart healthy diet with thin liquids. Meal completion has been 50%. RD to order nutritional supplements to aid in caloric and protein needs. Labs and medications reviewed.   Diet Order:   Diet Order            Diet Heart Room service appropriate? Yes with Assist; Fluid consistency: Thin  Diet effective now              EDUCATION NEEDS:   No education needs have been identified at this time  Skin:  Skin Assessment: Reviewed RN Assessment  Last BM:  5/31  Height:   Ht Readings from Last 1 Encounters:  02/17/19 5\' 1"  (1.549 m)    Weight:   Wt Readings from Last 1 Encounters:  02/17/19 59 kg    Ideal Body Weight:  47.7 kg  BMI:  Body mass index is 24.58 kg/m.  Estimated Nutritional Needs:   Kcal:   1650-1850  Protein:  75-90 grams  Fluid:  1.6 - 1.8 L/day   Corrin Parker, MS, RD, LDN Pager # [phone] After hours/ weekend pager # [phone]

## 2019-02-20 NOTE — Progress Notes (Signed)
Physical Therapy Treatment Patient Details Name: Carrie [name] MRN: [id number] DOB: [birth year] Today's Date: 02/20/2019    History of Present Illness [name] who presented to [facility] 5/29 with left sided weakness, facial droop and slurred speech. 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 . The patient was taken to interventional radiology per Dr. 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 The patient was returned to ICU on mechanical ventilation, extubated 5/31    PT Comments    Pt making steady progress with mobility.    Follow Up Recommendations  CIR     Equipment Recommendations  None recommended by PT    Recommendations for Other Services Rehab consult     Precautions / Restrictions Precautions Precautions: Fall Restrictions Weight Bearing Restrictions: No    Mobility  Bed Mobility Overal bed mobility: Needs Assistance Bed Mobility: Supine to Sit     Supine to sit: Min assist     General bed mobility comments: Assist to elevate trunk into sitting  Transfers Overall transfer level: Needs assistance Equipment used: 4-wheeled walker Transfers: Sit to/from Stand Sit to Stand: Min assist         General transfer comment: Assist to bring hips up and for balance. Pt slow to rise.  Ambulation/Gait Ambulation/Gait assistance: Min assist Gait Distance (Feet): 50 Feet Assistive device: 4-wheeled walker Gait Pattern/deviations: Step-through pattern;Trunk flexed;Narrow base of support;Decreased step length - right;Decreased step length - left;Shuffle Gait velocity: decreased Gait velocity interpretation: <1.31 ft/sec, indicative of household ambulator General Gait Details: Assist for balance and support. Pt with flexed trunk as well as flexed knees. Verbal cues to stand more erect and look up.   Stairs              Wheelchair Mobility    Modified Rankin (Stroke Patients Only) Modified Rankin (Stroke Patients Only) Pre-Morbid Rankin Score: Slight disability Modified Rankin: Moderately severe disability     Balance Overall balance assessment: Needs assistance Sitting-balance support: No upper extremity supported;Feet supported Sitting balance-Leahy Scale: Fair     Standing balance support: Bilateral upper extremity supported Standing balance-Leahy Scale: Poor Standing balance comment: rollator and min assist for static standing                            Cognition Arousal/Alertness: Awake/alert Behavior During Therapy: WFL for tasks assessed/performed Overall Cognitive Status: No family/caregiver present to determine baseline cognitive functioning Area of Impairment: Problem solving                             Problem Solving: Slow processing;Requires verbal cues        Exercises      General Comments        Pertinent Vitals/Pain      Home Living                      Prior Function            PT Goals (current goals can now be found in the care plan section) Progress towards PT goals: Progressing toward goals    Frequency    Min 4X/week      PT Plan Current plan remains appropriate    Co-evaluation  AM-PAC PT "6 Clicks" Mobility   Outcome Measure  Help needed turning from your back to your side while in a flat bed without using bedrails?: A Little Help needed moving from lying on your back to sitting on the side of a flat bed without using bedrails?: A Little Help needed moving to and from a bed to a chair (including a wheelchair)?: A Little Help needed standing up from a chair using your arms (e.g., wheelchair or bedside chair)?: A Little Help needed to walk in hospital room?: A Little Help needed climbing 3-5 steps with a railing? : A Lot 6 Click Score: 17    End of Session Equipment  Utilized During Treatment: Gait belt Activity Tolerance: Patient tolerated treatment well Patient left: in chair;with call bell/phone within reach;Other (comment)(with OT present) Nurse Communication: Mobility status PT Visit Diagnosis: Unsteadiness on feet (R26.81);Other abnormalities of gait and mobility (R26.89);Muscle weakness (generalized) (M62.81)     Time: 0086-7619 PT Time Calculation (min) (ACUTE ONLY): 22 min  Charges:  $Gait Training: 8-22 mins                     [place] Pager [phone] Office [place] 02/20/2019, 2:05 PM

## 2019-02-20 NOTE — Evaluation (Deleted)
Speech Language Pathology Evaluation Patient Details Name: Carrie [name] MRN: [id number] DOB: 05-02-1941 Today's Date: 02/20/2019 Time: 4098-1191 SLP Time Calculation (min) (ACUTE ONLY): 14 min  Problem List:  Patient Active Problem List   Diagnosis Date Noted  . Encounter for intubation   . Acute respiratory failure ([place])   . Embolism of right middle cerebral artery 02/17/2019  . CHF exacerbation ([place]) 02/03/2019  . CHF (congestive heart failure) ([place]) 02/02/2019  . SOB (shortness of breath) 02/02/2019  . Diarrhea 03/31/2018  . Abnormal LFTs 03/31/2018  . Hemoptysis 03/30/2018  . Elevated troponin 03/30/2018  . CKD (chronic kidney disease), stage III ([place]) 03/30/2018  . HLD (hyperlipidemia) 03/30/2018  . Blurry vision 03/30/2018  . Status post coronary artery stent placement   . Chronic diastolic heart failure ([place])   . History of mitral valve replacement with mechanical valve   . Hypertensive heart disease with heart failure ([place])   . Pure hypercholesterolemia   . Coronary artery disease involving native coronary artery of native heart without angina pectoris 08/11/2016  . PAH (pulmonary artery hypertension) ([place]) 05/05/2016  . Anemia due to other cause   . AKI (acute kidney injury) ([place])   . Hyponatremia 03/12/2016  . Chest pain 03/11/2016  . NSTEMI (non-ST elevated myocardial infarction) ([place]) 03/11/2016  . 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 Thrombocytopenia ([place]) 12/03/2014  . SSS (sick sinus syndrome) ([place]) 07/24/2014  . Longstanding persistent atrial fibrillation 07/24/2014  . Pacemaker 07/24/2014  . Tachycardia, a fib with RVR  04/03/2013  . Essential hypertension 04/03/2013  . SAH (subarachnoid hemorrhage), December 2012 03/21/2012  . Hemorrhage, hepatic, Rx'd with Coumadin reversal and embolisation 03/17/12 03/17/2012  . 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 Chronic anticoagulation, ( INR goal 2.0-2.5 due to history of  subdural hematoma and liver hemorrhage) 09/09/2011  . Muscle weakness of lower extremity 09/08/2011  . Pacemaker - single chamber Medtronic Adapta, 2008 09/08/2011  . S/P mitral valve replacement, St Jude 09/06/2011  . CONSTIPATION 02/12/2009   Past Medical History:  Past Medical History:  Diagnosis Date  . Anticoagulated on Coumadin    for mech valve and atrial fib  goal 2.0-2.5  . Anxiety   . Arthritis    "right shoulder" (03/15/2013)  . Atrial fibrillation ([place])   . Atrial flutter ([place])   . CAD (coronary artery disease) 08/11/2016  . CHF (congestive heart failure) ([place])   . Chronic combined systolic and diastolic CHF (congestive heart failure) ([place])    a. 02/2016 Echo: EF 45-50%.  . Diverticula of colon   . Exertional shortness of breath   . GERD (gastroesophageal reflux disease)   . Heart murmur   . Hemorrhage intraabdominal 03/17/2012  . History of blood transfusion    "once" (03/15/2013)  . Hyperlipidemia   . Hypertension   . Liver hemorrhage   . Migraines   . 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 Bi-leaflet St. Jude mechanical prosthesis; b. 02/2016 Echo: EF 45-50%, some degree of MR, sev dil LA/RA, sev TR.  . NSTEMI (non-ST elevated myocardial infarction) ([place])    a. 02/2016 elev trop/Cath: nonobs dzs,   . Pacemaker    medtronic adapta  . Pneumonia 2009   resolved.? OPD Rx  . Severe tricuspid regurgitation    a. 02/2016 Echo: Ef 45-50%, sev TR, PASP 56mmHg.  . Sick sinus syndrome ([place])    Dr Cristopher Peru. EP study negative. Pacemaker 12/15/06 Medtronic  . Stroke [facility])    "  they say I had a stroke last year" denies residual on 03/15/2013  . Subdural hematoma [facility])    Past Surgical History:  Past Surgical History:  Procedure Laterality Date  . APPENDECTOMY    . CARDIAC CATHETERIZATION  04/02/97   R&L:severe MR/pulmonary hypertension  . CARDIAC CATHETERIZATION N/A 03/16/2016   Procedure: Left Heart Cath and Coronary Angiography;  Surgeon: [name], MD;  Location: [place] CV LAB;  Service: Cardiovascular;  Laterality: N/A;  . CARDIAC CATHETERIZATION N/A 09/30/2016   Procedure: Left Heart Cath and Coronary Angiography;  Surgeon: [name], MD;  Location: [place] CV LAB;  Service: Cardiovascular;  Laterality: N/A;  . CARDIAC CATHETERIZATION N/A 09/30/2016   Procedure: Coronary Stent Intervention;  Surgeon: [name], MD;  3.5 x 12 mm resolute Onyx  to ostial RCA  . CATARACT EXTRACTION W/ INTRAOCULAR LENS  IMPLANT, BILATERAL Bilateral 2013  . CORONARY ANGIOPLASTY    . DILATION AND CURETTAGE OF UTERUS     "had fibroids" (03/15/2013)  . EXPLORATORY LAPAROTOMY     "had a growth on my intestines" (03/15/2013)  . HAMMER TOE SURGERY Right   . INSERT / REPLACE / REMOVE PACEMAKER  12/15/2006   Medtronic  . MITRAL VALVE REPLACEMENT  1998   St Jude mechanical; Dr. [name]  . PACEMAKER PLACEMENT  12/15/06   medtronic adapta for SSS  . PERMANENT PACEMAKER GENERATOR CHANGE N/A 07/24/2014   Procedure: PERMANENT PACEMAKER GENERATOR CHANGE;  Surgeon: [name], MD;  Location: [place] CATH LAB;  Service: Cardiovascular;  Laterality: N/A;  . PERSANTINE CARDIOLITE  08/07/03   mild inf. ischemia   . RIGHT HEART CATH N/A 02/06/2019   Procedure: RIGHT HEART CATH;  Surgeon: [name], MD;  Location: [place] CV LAB;  Service: Cardiovascular;  Laterality: N/A;  . TEE WITHOUT CARDIOVERSION  09/23/2011   Procedure: TRANSESOPHAGEAL ECHOCARDIOGRAM (TEE);  Surgeon: [name];  Location: [facility] ENDOSCOPY;  Service: Cardiovascular;  Laterality: N/A;  . TONSILLECTOMY    . TUBAL LIGATION    . US ECHOCARDIOGRAPHY  11/19/2011   EF 50-55%,RA mod to severely dilated,LA severely dilated,trace MR,small vegetation or mass on the MV,AOV mildly scleroticmild PI, RV pressure 40-95mmHg   HPI:  78 y/o who presented to [facility] 5/29 with left sided weakness, facial droop and slurred speech. CTA of the head/ neck showed acute occlusion of the  right M1 segment, delayed collateral flow to the R M2 branches. Patient underwent a common carotid arteriogram followed by complete revascularization of the right MCA M1 occlusion. Intubated 5/29-5/31. CXR Stable right pleural effusion and bibasilar subsegmental atelectasis.   Assessment / Plan / Recommendation Clinical Impression  Pt demonstrates functional and safe consumption of various textures. No cranial nerve involvement affecting oral structures. Audible wheeze present during respirations- SLP reiterated use of incentive spirometer. Several episodes of eructation and therapist offered suggestions to mitigate symptoms of reflux she affirmed. Recommend she continue regular texture, thin liquids and ST will follow up with pt to further ensure safety and reiterate strategies.      SLP Assessment  SLP Visit Diagnosis: Dysphagia, unspecified (R13.10)    Follow Up Recommendations  None    Frequency and Duration min 1 x/week         SLP Evaluation Cognition  Orientation Level: Oriented to person;Oriented to place;Oriented to time;Disoriented to situation       Comprehension       Expression     Oral / Motor  Oral Motor/Sensory Function Overall  Oral Motor/Sensory Function: Within functional limits   GO                    Houston Siren 02/20/2019, 9:39 AM   Orbie Pyo Colvin Caroli.Ed Risk analyst [phone] Office [phone]  '

## 2019-02-20 NOTE — Progress Notes (Signed)
STROKE TEAM PROGRESS NOTE   SUBJECTIVE (INTERVAL HISTORY) Patient sitting in chair, complains of mild coughing today.  Denies shortness of breath.  Repeat CXR showed no acute lung disease.  INR 1.7 today, still on heparin IV.  Pending CIR.  OBJECTIVE Vitals:   02/19/19 2311 02/20/19 0354 02/20/19 0746 02/20/19 1240  BP: (!) 105/58 (!) 101/56 122/65 114/73  Pulse: 78 (!) 52 73 65  Resp: 16 16 18 17   Temp: 98.4 F (36.9 C) 98 F (36.7 C) 98.2 F (36.8 C) 98.2 F (36.8 C)  TempSrc: Oral Oral Oral Oral  SpO2: 95% 94% 96% 100%  Weight:      Height:        CBC:  Recent Labs  Lab 02/17/19 1132  02/18/19 0314  02/19/19 0358 02/20/19 0730  WBC 3.7*  --  7.5  --  7.6 6.9  NEUTROABS 2.3  --  5.4  --   --   --   HGB 9.4*   < > 8.8*   < > 9.1* 8.6*  HCT 29.9*   < > 25.9*   < > 26.7* 26.1*  MCV 93.4  --  85.5  --  85.6 89.1  PLT 143*  --  175  --  170 162   < > = values in this interval not displayed.    Basic Metabolic Panel:  Recent Labs  Lab 02/17/19 1500  02/19/19 0358 02/20/19 0730  NA  --    < > 141 137  K  --    < > 2.8* 3.1*  CL  --    < > 109 106  CO2  --    < > 19* 22  GLUCOSE  --    < > 113* 92  BUN  --    < > 25* 24*  CREATININE  --    < > 1.31* 1.13*  CALCIUM  --    < > 9.1 9.0  MG 2.4  --   --  1.8   < > = values in this interval not displayed.    Lipid Panel:     Component Value Date/Time   CHOL 81 02/18/2019 0314   TRIG 34 02/19/2019 0358   HDL 28 (L) 02/18/2019 0314   CHOLHDL 2.9 02/18/2019 0314   VLDL 12 02/18/2019 0314   LDLCALC 41 02/18/2019 0314   HgbA1c:  Lab Results  Component Value Date   HGBA1C 5.2 02/18/2019   Urine Drug Screen:     Component Value Date/Time   LABOPIA NONE DETECTED 02/17/2019 1500   COCAINSCRNUR NONE DETECTED 02/17/2019 1500   LABBENZ NONE DETECTED 02/17/2019 1500   AMPHETMU NONE DETECTED 02/17/2019 1500   THCU NONE DETECTED 02/17/2019 1500   LABBARB NONE DETECTED 02/17/2019 1500    Alcohol Level      Component Value Date/Time   ETH <10 02/17/2019 1132    IMAGING Ct Head Code Stroke Wo Contrast 02/17/2019 IMPRESSION:  1. No acute intracranial abnormality  2. ASPECTS is 10   Ct Angio Head W Or Wo Contrast Ct Angio Neck W Or Wo Contrast 02/17/2019 IMPRESSION:  1. Occlusion right M1 segment which appears acute. Delayed collateral flow to right M2 branches.  2. No significant carotid or vertebral artery stenosis in the neck.  3. Marked enlargement of the superior ophthalmic vein bilaterally. No evidence of carotid cavernous fistula. Marked dilatation of the jugular veins bilaterally is chronic and likely due to elevated right heart pressures. The patient has history of cardiac  valvular disease.   Ct Head Wo Contrast 02/17/2019 IMPRESSION:  No acute intracranial abnormality. Atrophy and chronic microvascular ischemic change.   Repeat CT Head WO Contrast 02/18/2019 No acute intracranial abnormality by noncontrast CT. Stable atrophy and chronic white matter microvascular changes.   Transthoracic Echocardiogram  02/03/2019 IMPRESSIONS  1. The left ventricle has normal systolic function with an ejection fraction of 60-65%. The cavity size was normal. There is moderately increased left ventricular wall thickness. Left ventricular diastolic function could not be evaluated due to mitral valve replacement/repair. There is abnormal septal motion consistent with RV pacemaker and right ventricular volume and pressure overload.  2. The right ventricle has moderately reduced systolic function. The cavity was moderately enlarged. There is no increase in right ventricular wall thickness.  3. Left atrial size was severely dilated.  4. Right atrial size was severely dilated.  5. A MVR mechanical valve is present in the mitral position.  6. The tricuspid valve is grossly normal. Tricuspid valve regurgitation is moderate-severe.  7. The aortic valve is tricuspid. Mild calcification of the aortic valve.  Aortic valve regurgitation is mild by color flow Doppler. Mild stenosis of the aortic valve.  8. The inferior vena cava was dilated in size with <50% respiratory variability.  9. The interatrial septum was not well visualized. 10. When compared to the prior study: 03/31/2018: LVEF 60-65%, RVSP 66 mmHg.  EKG - atrial fibrillation - ventricular response 68 BPM (See cardiology reading for complete details)   PHYSICAL EXAM  Temp:  [97.4 F (36.3 C)-98.4 F (36.9 C)] 98.2 F (36.8 C) (06/01 1240) Pulse Rate:  [52-78] 65 (06/01 1240) Resp:  [16-28] 17 (06/01 1240) BP: (101-128)/(56-73) 114/73 (06/01 1240) SpO2:  [93 %-100 %] 100 % (06/01 1240)  General - Well nourished, well developed, intermittent coughing.  Ophthalmologic - fundi not visualized due to noncooperation.  Cardiovascular - irregularly irregular heart rate and rhythm.  Neuro - awake alert orientated to time place and people.  Language fluent, following simple commands, intact naming and repetition. PERRL, EOMI, no gaze preference, visual field full, tracking bilaterally. Facial symmetry and tongue midline on protrusion. BUE 4/5 and BLE 3+/5. DTR 1+ and no babinski. Sensation symmetrical, coordination seems to have Left FTN mild dysmetria and gait not tested.   ASSESSMENT/PLAN Ms. AJOONI KARAM is a 78 y.o. female with history of a-fib, mechanincal heart valve, warfarin therapy, pacemaker, prior SDH, CAD, CHF, HLD, HTN, NSTEMI in 2017 and prior stroke presenting after she collapsed with right sided gaze preference, left facial droop and left hemiplegia. She did not receive IV t-PA due to warfarin anticoagulation with INR 1.9.  Stroke: suspected Rt MCA infarct with right M1 cut off s/p IR with TICI3 reperfusion, likely due to mechanical valve and A. fib with subtherapeutic INR  CT head - No acute intracranial abnormality   Repeat Head CT -no acute intracranial abnormality  MRI head - PPM not compatible with MRI  CTA H&N -  Occlusion right M1 segment which appears acute.   IR with TICI3 reperfusion  2D Echo - EF 60 - 65%. No cardiac source of emboli identified.   Sars Corona Virus 2  - negative  LDL - 41  HgbA1c - 5.2  INR 1.9 -> 1.7  UDS  - negative  VTE prophylaxis - IV hep->warfarin  warfarin daily prior to admission, on Heparin IV and Coumadin given mechanical valve and atrial fibrillation.  INR goal 2.0-2.5  INR now 1.7  Therapy recommendations:  CIR - ?  Possible HH with husband. Rehab admissions coordinator is following up with husband  Disposition:  Pending  Mechanical mitral valve  Followed by cardiology  On Coumadin PTA  INR 1.9 on admission  INR goal 2.0-2.5 due to history of SDH and liver hemorrhage  Currently on heparin IV with bridge to coumadin   INR 1.9->1.7  Chronic A. Fib  On Coumadin PTA  Rate controlled   INR goal 2.0-2.5 due to history of SDH and liver hemorrhage  INR 1.9 on admission  Currently on heparin IV with bridge to coumadin   INR 1.9-> 1.7  History of [facility]  08/2011 patient admitted for mitral endocarditis, TEE showed vegetation on prosthetic mitral valve.  Also found to have small SAH suspected posttraumatic versus septic emboli, which subsequently resolved, put on heparin drip bridged to Coumadin.   Respiratory failure, resolved  Was intubated for procedure  Extubated 02/19/19  Repeat CXR x 2 no active disease  Denies SOB  Hypotension History of hypertension  BP stable . Currently off neo . On Lasix and spironolactone . BP goal normotensive . IV fluid @ 40  Hyperlipidemia  Lipid lowering medication PTA:  Lipitor 40 mg daily  LDL 41, goal < 70  Current lipid lowering medication:  Lipitor 40 mg daily  Continue statin at discharge  Anemia  Hemoglobin 9.4-10.5-8.8-9.1-8.6  Currently on IV heparin and coumadin  close CBC monitoring  PRBC transfusion if Hb < 7  Dysphagia   Extubated 5/31  Speech following  On  diet  Other Stroke Risk Factors  Advanced age  Family hx stroke (father and brother)  Coronary artery disease, non-STEMI 2017  Migraines  CHF  Other Active Problems  CKD stage III creatinine - 1.38->1.40->1.29->1.31->1.13  Pacemaker  History of SDH  Hypokalemia - 3.5->2.8->3.1 - supplement   Intermittent coughing - monitoring - CXR neg  Hospital day # 3  Rosalin Hawking, MD PhD Stroke Neurology 02/20/2019 2:26 PM  To contact Stroke Continuity provider, please refer to [email or web address] After hours, contact General Neurology

## 2019-02-20 NOTE — Progress Notes (Signed)
Paged on call MD. Have had call from remote tele about pts heart rate bouncing up to the 170's but returns back to her 80-100's. Pt in known Afib. Pt received her night time Lopressor. Repeat VS are 101/54 (67), HR 101. Pt reports feeling like heart is racing at times; no other complaints.  On call MD paged and made aware of non sustained Afib with RVR. New order for additional 12.5 mg of Lopressor. Will continue to monitor.

## 2019-02-20 NOTE — Progress Notes (Signed)
ANTICOAGULATION CONSULT NOTE   Pharmacy Consult for heparin Indication: atrial fibrillation and mechanical valve  Allergies  Allergen Reactions  . Codeine Nausea And Vomiting    Patient Measurements: Height: 5\' 1"  (154.9 cm) Weight: 130 lb 1.1 oz (59 kg) IBW/kg (Calculated) : 47.8 Heparin Dosing Weight: 59kg  Vital Signs: Temp: 98.2 F (36.8 C) (06/01 0746) Temp Source: Oral (06/01 0746) BP: 122/65 (06/01 0746) Pulse Rate: 73 (06/01 0746)  Labs: Recent Labs    02/17/19 1132  02/18/19 0314 02/18/19 1904  02/19/19 0358 02/19/19 1132 02/19/19 1935 02/20/19 0730  HGB 9.4*   < > 8.8* 8.8*  --  9.1*  --   --   --   HCT 29.9*   < > 25.9* 26.0*  --  26.7*  --   --   --   PLT 143*  --  175  --   --  170  --   --   --   APTT 39*  --   --   --   --   --   --   --   --   LABPROT 21.9*  --   --   --   --   --   --   --  20.1*  INR 1.9*  --   --   --   --   --   --   --  1.7*  HEPARINUNFRC  --   --   --   --    < > <0.10* 0.25* 0.29* 0.34  CREATININE 1.38*   < > 1.29*  --   --  1.31*  --   --  1.13*   < > = values in this interval not displayed.    Estimated Creatinine Clearance: 34.4 mL/min (A) (by C-G formula based on SCr of 1.13 mg/dL (H)).  Medications:  Infusions:  . heparin 1,200 Units/hr (02/19/19 2105)    Assessment: 81 yof s/p MCA M1 occlusion s/p IR revascularization. She is on chronic warfarin for afib and mechanical valve. INR on admission was subtherapeutic at 1.9 so she was initiated on IV heparin as a bridge back to therapeutic warfarin.  INR this am 1.7 (low) Hep lvl within goal 0.34   Goal of Therapy:  Heparin level 0.3-0.5 units/ml INR 2-2.5 Monitor platelets by anticoagulation protocol: Yes   Plan:  Continue Heparin gtt to 1200 units/hr - NO BOLUSES Warfarin 5 mg x 1 Daily Hep Lvl INR CBC  Levester Fresh, PharmD, BCPS, BCCCP Clinical Pharmacist [phone]  Please check AMION for all [place] numbers  02/20/2019 9:20 AM

## 2019-02-20 NOTE — Progress Notes (Addendum)
Inpatient Rehab Admissions:  Inpatient Rehab Consult received.  I met with patient at the bedside for rehabilitation assessment and to discuss goals and expectations of an inpatient rehab admission.  She is open to CIR stay, however mentioned that her husband provides a lot of assistance to her at home. Unsure of pt's cognitive baseline.  Will need to f/u with her spouse to confirm whether or not she is at her functional baseline and whether CIR is most appropriate venue of care.  I will attempt to reach him today and continue to follow.   Addendum: I was able to reach pt's husband, Jeneen Rinks.  He states that she was mod I at home for mobility, but that immediately prior to her most recent admissions he was having to assist with lower body ADLs 2/2 LE edema.  He is hopeful she will be able to come for CIR and is agreeable to providing 24/7 supervision at her discharge.  I will need insurance authorization before I can proceed with a possible admission, and I will open up a case today.   Signed: Shann Medal, PT, DPT Admissions Coordinator [phone] 02/20/19  11:57 AM

## 2019-02-20 NOTE — Progress Notes (Signed)
Patient ID: Carrie [name], female   DOB: [birth year], 78 y.o.   MRN: [id number]   IR Round Note via phone RN Laverda Sorenson  Procedure 5/29: complete revascularization of Rt MCA  Extubated 5/31 Moving all 4s Follows all commands  Rt groin NT no bleeding; No hematoma Rt foot 2+ pulses  Some wheezing but breathing well and has PRN breathing tx Says" feels better"  Will report to Dr Estanislado Pandy

## 2019-02-20 NOTE — Evaluation (Signed)
Occupational Therapy Evaluation Patient Details Name: Carrie [name] MRN: [id number] DOB: [birth year] Today's Date: 02/20/2019    History of Present Illness [name] who presented to [facility] 5/29 with left sided weakness, facial droop and slurred speech. 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 . The patient was taken to interventional radiology per Dr. 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 The patient was returned to ICU on mechanical ventilation, extubated 5/31   Clinical Impression   PTA patient reports use cane for mobility and spouse assisting with ADLs as needed (mainly LB dressing). Pt admitted for above, limited by problem list below including impaired balance, generalized weakness, and decreased activity tolerance.  Pt requires min-mod assist for transfers, min guard for grooming at sink, max assist for LB ADLs and setup assist for UB ADLs.  Patient requires increased time and effort for all activities. She will benefit from continued OT services while admitted and after dc at CIR level in order to maximize independence and return to PLOF. Will follow.     Follow Up Recommendations  CIR;Supervision/Assistance - 24 hour    Equipment Recommendations  None recommended by OT    Recommendations for Other Services Rehab consult     Precautions / Restrictions Precautions Precautions: Fall Restrictions Weight Bearing Restrictions: No      Mobility Bed Mobility Overal bed mobility: Needs Assistance Bed Mobility: Supine to Sit     Supine to sit: Min assist     General bed mobility comments: OOB upon entry  Transfers Overall transfer level: Needs assistance Equipment used: Rolling walker (2 wheeled) Transfers: Sit to/from Stand Sit to Stand: Min assist;Mod assist         General transfer comment: min assist  from recliner, mod assist from commode for power up and balance; cueing for hand placement and safety     Balance Overall balance assessment: Needs assistance Sitting-balance support: No upper extremity supported;Feet supported Sitting balance-Leahy Scale: Fair     Standing balance support: No upper extremity supported;During functional activity Standing balance-Leahy Scale: Poor Standing balance comment: relaint on support, but able to groom at sink without UE support (min assist)                           ADL either performed or assessed with clinical judgement   ADL Overall ADL's : Needs assistance/impaired     Grooming: Min guard;Standing   Upper Body Bathing: Set up;Sitting   Lower Body Bathing: Moderate assistance;Sit to/from stand   Upper Body Dressing : Set up;Sitting   Lower Body Dressing: Maximal assistance;Sit to/from stand   Toilet Transfer: Ambulation;Moderate assistance;RW;Grab bars   Toileting- Clothing Manipulation and Hygiene: Moderate assistance;Sit to/from stand Toileting - Clothing Manipulation Details (indicate cue type and reason): pt incontient of bladder during mobility to restroom, mod assist for clothing mgmt/hygeine upon reaching commode     Functional mobility during ADLs: Rolling walker;Minimal assistance       Vision         Perception     Praxis      Pertinent Vitals/Pain Pain Assessment: No/denies pain     Hand Dominance Right   Extremity/Trunk Assessment Upper Extremity Assessment Upper Extremity Assessment: Generalized weakness   Lower Extremity Assessment Lower Extremity Assessment: Defer to PT evaluation   Cervical / Trunk  Assessment Cervical / Trunk Assessment: Normal   Communication Communication Communication: HOH   Cognition Arousal/Alertness: Awake/alert Behavior During Therapy: WFL for tasks assessed/performed Overall Cognitive Status: No family/caregiver present to determine baseline cognitive  functioning Area of Impairment: Problem solving;Safety/judgement;Awareness                         Safety/Judgement: Decreased awareness of safety Awareness: Emergent Problem Solving: Slow processing;Decreased initiation;Difficulty sequencing;Requires verbal cues;Requires tactile cues     General Comments       Exercises     Shoulder Instructions      Home Living Family/patient expects to be discharged to:: Private residence Living Arrangements: Spouse/significant other Available Help at Discharge: Family;Available 24 hours/day Type of Home: House Home Access: Stairs to enter [employer] of Steps: 1 Entrance Stairs-Rails: None Home Layout: Multi-level Alternate Level Stairs-Number of Steps: 7 then 7 Alternate Level Stairs-Rails: Right Bathroom Shower/Tub: Teacher, early years/pre: Handicapped height     Home Equipment: Cane - single point;Walker - 4 wheels;Bedside commode;Grab bars - tub/shower          Prior Functioning/Environment Level of Independence: Needs assistance  Gait / Transfers Assistance Needed: use of SPC for mobility ADL's / Homemaking Assistance Needed: pt reports spouse assist with LB ADLS (compression hose/shoes), but she was able to complete all other self care without assist; basin bathes only            OT Problem List: Decreased strength;Decreased activity tolerance;Impaired balance (sitting and/or standing);Decreased safety awareness;Decreased knowledge of use of DME or AE;Decreased knowledge of precautions      OT Treatment/Interventions: Self-care/ADL training;Energy conservation;DME and/or AE instruction;Therapeutic activities;Patient/family education;Balance training;Therapeutic exercise    OT Goals(Current goals can be found in the care plan section) Acute Rehab OT Goals Patient Stated Goal: to get better OT Goal Formulation: With patient Time For Goal Achievement: 03/06/19 Potential to Achieve Goals:  Good ADL Goals Pt Will Perform Grooming: with supervision;standing Pt Will Perform Lower Body Dressing: with min assist;sit to/from stand Pt Will Transfer to Toilet: with supervision;ambulating Pt Will Perform Toileting - Clothing Manipulation and hygiene: with modified independence;sit to/from stand Pt/caregiver will Perform Home Exercise Program: Both right and left upper extremity;Increased strength;With written HEP provided  OT Frequency: Min 2X/week   Barriers to D/C:            Co-evaluation              AM-PAC OT "6 Clicks" Daily Activity     Outcome Measure Help from another person eating meals?: A Little Help from another person taking care of personal grooming?: A Little Help from another person toileting, which includes using toliet, bedpan, or urinal?: A Lot Help from another person bathing (including washing, rinsing, drying)?: A Lot Help from another person to put on and taking off regular upper body clothing?: None Help from another person to put on and taking off regular lower body clothing?: A Lot 6 Click Score: 16   End of Session Equipment Utilized During Treatment: Gait belt;Rolling walker Nurse Communication: Mobility status  Activity Tolerance: Patient tolerated treatment well Patient left: in chair;with call bell/phone within reach;with nursing/sitter in room;with chair alarm set  OT Visit Diagnosis: Other abnormalities of gait and mobility (R26.89);Muscle weakness (generalized) (M62.81)                Time: 6962-9528 OT Time Calculation (min): 37 min Charges:  OT General Charges $OT Visit: 1 Visit OT Evaluation $  OT Eval Moderate Complexity: 1 Mod OT Treatments $Self Care/Home Management : 8-22 mins  Delight Stare, OT Acute Rehabilitation Services Pager [phone] Office [phone]   Delight Stare 02/20/2019, 2:15 PM

## 2019-02-20 NOTE — Evaluation (Addendum)
Clinical/Bedside Swallow Evaluation Patient Details  Name: Carrie [name] MRN: [id number] Date of Birth: 13-Jul-1941  Today's Date: 02/20/2019 Time: SLP Start Time (ACUTE ONLY): 0913 SLP Stop Time (ACUTE ONLY): 0927 SLP Time Calculation (min) (ACUTE ONLY): 14 min  Past Medical History:  Past Medical History:  Diagnosis Date  . Anticoagulated on Coumadin    for mech valve and atrial fib  goal 2.0-2.5  . Anxiety   . Arthritis    "right shoulder" (03/15/2013)  . Atrial fibrillation ([place])   . Atrial flutter ([place])   . CAD (coronary artery disease) 08/11/2016  . CHF (congestive heart failure) ([place])   . Chronic combined systolic and diastolic CHF (congestive heart failure) ([place])    a. 02/2016 Echo: EF 45-50%.  . Diverticula of colon   . Exertional shortness of breath   . GERD (gastroesophageal reflux disease)   . Heart murmur   . Hemorrhage intraabdominal 03/17/2012  . History of blood transfusion    "once" (03/15/2013)  . Hyperlipidemia   . Hypertension   . Liver hemorrhage   . Migraines   . 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 Bi-leaflet St. Jude mechanical prosthesis; b. 02/2016 Echo: EF 45-50%, some degree of MR, sev dil LA/RA, sev TR.  . NSTEMI (non-ST elevated myocardial infarction) ([place])    a. 02/2016 elev trop/Cath: nonobs dzs,   . Pacemaker    medtronic adapta  . Pneumonia 2009   resolved.? OPD Rx  . Severe tricuspid regurgitation    a. 02/2016 Echo: Ef 45-50%, sev TR, PASP 42mmHg.  . Sick sinus syndrome ([place])    Dr Cristopher Peru. EP study negative. Pacemaker 12/15/06 Medtronic  . Stroke [facility])    "they say I had a stroke last year" denies residual on 03/15/2013  . Subdural hematoma [facility])    Past Surgical History:  Past Surgical History:  Procedure Laterality Date  . APPENDECTOMY    . CARDIAC CATHETERIZATION  04/02/97   R&L:severe MR/pulmonary hypertension  . CARDIAC CATHETERIZATION N/A 03/16/2016   Procedure: Left Heart Cath and Coronary Angiography;   Surgeon: [name], MD;  Location: [place] CV LAB;  Service: Cardiovascular;  Laterality: N/A;  . CARDIAC CATHETERIZATION N/A 09/30/2016   Procedure: Left Heart Cath and Coronary Angiography;  Surgeon: [name], MD;  Location: [place] CV LAB;  Service: Cardiovascular;  Laterality: N/A;  . CARDIAC CATHETERIZATION N/A 09/30/2016   Procedure: Coronary Stent Intervention;  Surgeon: [name], MD;  3.5 x 12 mm resolute Onyx  to ostial RCA  . CATARACT EXTRACTION W/ INTRAOCULAR LENS  IMPLANT, BILATERAL Bilateral 2013  . CORONARY ANGIOPLASTY    . DILATION AND CURETTAGE OF UTERUS     "had fibroids" (03/15/2013)  . EXPLORATORY LAPAROTOMY     "had a growth on my intestines" (03/15/2013)  . HAMMER TOE SURGERY Right   . INSERT / REPLACE / REMOVE PACEMAKER  12/15/2006   Medtronic  . MITRAL VALVE REPLACEMENT  1998   St Jude mechanical; Dr. [name]  . PACEMAKER PLACEMENT  12/15/06   medtronic adapta for SSS  . PERMANENT PACEMAKER GENERATOR CHANGE N/A 07/24/2014   Procedure: PERMANENT PACEMAKER GENERATOR CHANGE;  Surgeon: [name], MD;  Location: [place] CATH LAB;  Service: Cardiovascular;  Laterality: N/A;  . PERSANTINE CARDIOLITE  08/07/03   mild inf. ischemia   . RIGHT HEART CATH N/A 02/06/2019   Procedure: RIGHT HEART CATH;  Surgeon: [name], MD;  Location: [place] CV LAB;  Service: Cardiovascular;  Laterality: N/A;  . TEE WITHOUT CARDIOVERSION  09/23/2011   Procedure: TRANSESOPHAGEAL ECHOCARDIOGRAM (TEE);  Surgeon: [name];  Location: [facility] ENDOSCOPY;  Service: Cardiovascular;  Laterality: N/A;  . TONSILLECTOMY    . TUBAL LIGATION    . US ECHOCARDIOGRAPHY  11/19/2011   EF 50-55%,RA mod to severely dilated,LA severely dilated,trace MR,small vegetation or mass on the MV,AOV mildly scleroticmild PI, RV pressure 40-55mmHg   HPI:  78 y/o who presented to [facility] 5/29 with left sided weakness, facial droop and slurred speech. CTA of the head/ neck showed acute  occlusion of the right M1 segment, delayed collateral flow to the R M2 branches. Patient underwent a common carotid arteriogram followed by complete revascularization of the right MCA M1 occlusion. Intubated 5/29-5/31. CXR Stable right pleural effusion and bibasilar subsegmental atelectasis.   Assessment / Plan / Recommendation Clinical Impression  Pt demonstrates functional and safe consumption of various textures. No cranial nerve involvement affecting oral structures. Audible wheeze present during respirations- SLP reiterated use of incentive spirometer. Several episodes of eructation and therapist offered suggestions to mitigate symptoms of reflux she affirmed. Recommend she continue regular texture, thin liquids and ST will follow up with pt to further ensure safety, reiterate strategies and perform speech-cognitive evaluation.  SLP Visit Diagnosis: Dysphagia, unspecified (R13.10)    Aspiration Risk  Mild aspiration risk    Diet Recommendation Regular;Thin liquid   Liquid Administration via: Straw;Cup Medication Administration: Whole meds with liquid Supervision: Patient able to self feed Compensations: Slow rate;Small sips/bites Postural Changes: Remain upright for at least 30 minutes after po intake;Seated upright at 90 degrees    Other  Recommendations Oral Care Recommendations: Oral care BID   Follow up Recommendations None      Frequency and Duration min 1 x/week  1 week       Prognosis Prognosis for Safe Diet Advancement: Good      Swallow Study   General HPI: 78 y/o who [facility] 5/29 with left sided weakness, facial droop and slurred speech. CTA of the head/ neck showed acute occlusion of the right M1 segment, delayed collateral flow to the R M2 branches. Patient underwent a common carotid arteriogram followed by complete revascularization of the right MCA M1 occlusion. Intubated 5/29-5/31. CXR Stable right pleural effusion and bibasilar subsegmental  atelectasis. Type of Study: Bedside Swallow Evaluation Previous Swallow Assessment: (none) Diet Prior to this Study: Regular;Thin liquids Temperature Spikes Noted: No Respiratory Status: Room air History of Recent Intubation: Yes Length of Intubations (days): 3 days Date extubated: 02/19/19 Behavior/Cognition: Alert;Cooperative;Pleasant mood Oral Cavity Assessment: Within Functional Limits Oral Care Completed by SLP: No Oral Cavity - Dentition: Other (Comment)(has partial. ) Vision: Functional for self-feeding Self-Feeding Abilities: Able to feed self Patient Positioning: Upright in bed Baseline Vocal Quality: Normal Volitional Cough: Weak;Other (Comment)(audible wheeze during respirations) Volitional Swallow: Able to elicit    Oral/Motor/Sensory Function Overall Oral Motor/Sensory Function: Within functional limits   Ice Chips Ice chips: Not tested   Thin Liquid Thin Liquid: Within functional limits Presentation: Cup;Straw    Nectar Thick Nectar Thick Liquid: Not tested   Honey Thick Honey Thick Liquid: Not tested   Puree Puree: Within functional limits   Solid     Solid: Within functional limits      Houston Siren 02/20/2019,9:41 AM   Orbie Pyo Colvin Caroli.Ed Risk analyst [phone] Office [phone]

## 2019-02-20 NOTE — Care Management Important Message (Signed)
Important Message  Patient Details  Name: Carrie [name] MRN: [id number] Date of Birth: [birth year]   Medicare Important Message Given:  Yes  Due to illness patient is not ale to sign/Unsigned copy left.   [name] 02/20/2019, 3:20 PM

## 2019-02-21 ENCOUNTER — Inpatient Hospital Stay (HOSPITAL_COMMUNITY): Payer: Medicare Other

## 2019-02-21 ENCOUNTER — Encounter (HOSPITAL_COMMUNITY): Payer: Self-pay | Admitting: Interventional Radiology

## 2019-02-21 DIAGNOSIS — Z8679 Personal history of other diseases of the circulatory system: Secondary | ICD-10-CM

## 2019-02-21 DIAGNOSIS — I5033 Acute on chronic diastolic (congestive) heart failure: Secondary | ICD-10-CM

## 2019-02-21 LAB — RENAL FUNCTION PANEL
Albumin: 2.8 g/dL — ABNORMAL LOW (ref 3.5–5.0)
Anion gap: 8 (ref 5–15)
BUN: 31 mg/dL — ABNORMAL HIGH (ref 8–23)
CO2: 22 mmol/L (ref 22–32)
Calcium: 9.1 mg/dL (ref 8.9–10.3)
Chloride: 105 mmol/L (ref 98–111)
Creatinine, Ser: 1.23 mg/dL — ABNORMAL HIGH (ref 0.44–1.00)
GFR calc Af Amer: 49 mL/min — ABNORMAL LOW (ref >60)
GFR calc non Af Amer: 42 mL/min — ABNORMAL LOW (ref >60)
Glucose, Bld: 120 mg/dL — ABNORMAL HIGH (ref 70–99)
Phosphorus: 2.2 mg/dL — ABNORMAL LOW (ref 2.5–4.6)
Potassium: 4.4 mmol/L (ref 3.5–5.1)
Sodium: 135 mmol/L (ref 135–145)

## 2019-02-21 LAB — BASIC METABOLIC PANEL
Anion gap: 9 (ref 5–15)
BUN: 31 mg/dL — ABNORMAL HIGH (ref 8–23)
CO2: 20 mmol/L — ABNORMAL LOW (ref 22–32)
Calcium: 8.8 mg/dL — ABNORMAL LOW (ref 8.9–10.3)
Chloride: 107 mmol/L (ref 98–111)
Creatinine, Ser: 1.28 mg/dL — ABNORMAL HIGH (ref 0.44–1.00)
GFR calc Af Amer: 47 mL/min — ABNORMAL LOW (ref >60)
GFR calc non Af Amer: 40 mL/min — ABNORMAL LOW (ref >60)
Glucose, Bld: 102 mg/dL — ABNORMAL HIGH (ref 70–99)
Potassium: 5.2 mmol/L — ABNORMAL HIGH (ref 3.5–5.1)
Sodium: 136 mmol/L (ref 135–145)

## 2019-02-21 LAB — CBC
HCT: 26.6 % — ABNORMAL LOW (ref 36.0–46.0)
Hemoglobin: 8.7 g/dL — ABNORMAL LOW (ref 12.0–15.0)
MCH: 28.9 pg (ref 26.0–34.0)
MCHC: 32.7 g/dL (ref 30.0–36.0)
MCV: 88.4 fL (ref 80.0–100.0)
Platelets: 161 10*3/uL (ref 150–400)
RBC: 3.01 MIL/uL — ABNORMAL LOW (ref 3.87–5.11)
RDW: 25.2 % — ABNORMAL HIGH (ref 11.5–15.5)
WBC: 6.6 10*3/uL (ref 4.0–10.5)
nRBC: 0.8 % — ABNORMAL HIGH (ref 0.0–0.2)

## 2019-02-21 LAB — HEPARIN LEVEL (UNFRACTIONATED): Heparin Unfractionated: 0.37 IU/mL (ref 0.30–0.70)

## 2019-02-21 LAB — PROTIME-INR
INR: 1.8 — ABNORMAL HIGH (ref 0.8–1.2)
Prothrombin Time: 20.8 seconds — ABNORMAL HIGH (ref 11.4–15.2)

## 2019-02-21 MED ORDER — FUROSEMIDE 10 MG/ML IJ SOLN
80.0000 mg | Freq: Two times a day (BID) | INTRAMUSCULAR | Status: DC
  Administered 2019-02-21 – 2019-02-22 (×2): 80 mg via INTRAVENOUS
  Filled 2019-02-21 (×2): qty 8

## 2019-02-21 MED ORDER — ASPIRIN EC 81 MG PO TBEC
81.0000 mg | DELAYED_RELEASE_TABLET | Freq: Every day | ORAL | Status: DC
  Administered 2019-02-22: 81 mg via ORAL
  Filled 2019-02-21: qty 1

## 2019-02-21 MED ORDER — GUAIFENESIN 100 MG/5ML PO SOLN
5.0000 mL | ORAL | Status: DC | PRN
  Administered 2019-02-21 – 2019-02-22 (×2): 100 mg via ORAL
  Filled 2019-02-21 (×2): qty 5

## 2019-02-21 MED ORDER — ASPIRIN 81 MG PO CHEW
CHEWABLE_TABLET | ORAL | Status: AC
  Administered 2019-02-21: 81 mg
  Filled 2019-02-21: qty 1

## 2019-02-21 MED ORDER — WARFARIN SODIUM 6 MG PO TABS
6.0000 mg | ORAL_TABLET | Freq: Once | ORAL | Status: AC
  Administered 2019-02-21: 6 mg via ORAL
  Filled 2019-02-21: qty 1

## 2019-02-21 NOTE — Evaluation (Signed)
Speech Language Pathology Evaluation Patient Details Name: Carrie [name] MRN: [id number] DOB: 1941/07/02 Today's Date: 02/21/2019 Time: 4742-5956 SLP Time Calculation (min) (ACUTE ONLY): 24 min  Problem List:  Patient Active Problem List   Diagnosis Date Noted  . Encounter for intubation   . Acute respiratory failure ([place])   . Embolism of right middle cerebral artery 02/17/2019  . CHF exacerbation ([place]) 02/03/2019  . CHF (congestive heart failure) ([place]) 02/02/2019  . SOB (shortness of breath) 02/02/2019  . Diarrhea 03/31/2018  . Abnormal LFTs 03/31/2018  . Hemoptysis 03/30/2018  . Elevated troponin 03/30/2018  . CKD (chronic kidney disease), stage III ([place]) 03/30/2018  . HLD (hyperlipidemia) 03/30/2018  . Blurry vision 03/30/2018  . Status post coronary artery stent placement   . Chronic diastolic heart failure ([place])   . History of mitral valve replacement with mechanical valve   . Hypertensive heart disease with heart failure ([place])   . Pure hypercholesterolemia   . Coronary artery disease involving native coronary artery of native heart without angina pectoris 08/11/2016  . PAH (pulmonary artery hypertension) ([place]) 05/05/2016  . Anemia due to other cause   . AKI (acute kidney injury) ([place])   . Hyponatremia 03/12/2016  . Chest pain 03/11/2016  . NSTEMI (non-ST elevated myocardial infarction) ([place]) 03/11/2016  . 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 Thrombocytopenia ([employer]) 12/03/2014  . SSS (sick sinus syndrome) ([place]) 07/24/2014  . Longstanding persistent atrial fibrillation 07/24/2014  . Pacemaker 07/24/2014  . Tachycardia, a fib with RVR  04/03/2013  . Essential hypertension 04/03/2013  . SAH (subarachnoid hemorrhage), December 2012 03/21/2012  . Hemorrhage, hepatic, Rx'd with Coumadin reversal and embolisation 03/17/12 03/17/2012  . 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 Chronic anticoagulation, ( INR goal 2.0-2.5 due to history of  subdural hematoma and liver hemorrhage) 09/09/2011  . Muscle weakness of lower extremity 09/08/2011  . Pacemaker - single chamber Medtronic Adapta, 2008 09/08/2011  . S/P mitral valve replacement, St Jude 09/06/2011  . CONSTIPATION 02/12/2009   Past Medical History:  Past Medical History:  Diagnosis Date  . Anticoagulated on Coumadin    for mech valve and atrial fib  goal 2.0-2.5  . Anxiety   . Arthritis    "right shoulder" (03/15/2013)  . Atrial fibrillation ([place])   . Atrial flutter ([place])   . CAD (coronary artery disease) 08/11/2016  . CHF (congestive heart failure) ([place])   . Chronic combined systolic and diastolic CHF (congestive heart failure) ([place])    a. 02/2016 Echo: EF 45-50%.  . Diverticula of colon   . Exertional shortness of breath   . GERD (gastroesophageal reflux disease)   . Heart murmur   . Hemorrhage intraabdominal 03/17/2012  . History of blood transfusion    "once" (03/15/2013)  . Hyperlipidemia   . Hypertension   . Liver hemorrhage   . Migraines   . 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 Bi-leaflet St. Jude mechanical prosthesis; b. 02/2016 Echo: EF 45-50%, some degree of MR, sev dil LA/RA, sev TR.  . NSTEMI (non-ST elevated myocardial infarction) ([place])    a. 02/2016 elev trop/Cath: nonobs dzs,   . Pacemaker    medtronic adapta  . Pneumonia 2009   resolved.? OPD Rx  . Severe tricuspid regurgitation    a. 02/2016 Echo: Ef 45-50%, sev TR, PASP 84mmHg.  . Sick sinus syndrome ([place])    Dr Cristopher Peru. EP study negative. Pacemaker 12/15/06 Medtronic  . Stroke [facility])    "  they say I had a stroke last year" denies residual on 03/15/2013  . Subdural hematoma [facility])    Past Surgical History:  Past Surgical History:  Procedure Laterality Date  . APPENDECTOMY    . CARDIAC CATHETERIZATION  04/02/97   R&L:severe MR/pulmonary hypertension  . CARDIAC CATHETERIZATION N/A 03/16/2016   Procedure: Left Heart Cath and Coronary Angiography;  Surgeon: [name], MD;  Location: [place] CV LAB;  Service: Cardiovascular;  Laterality: N/A;  . CARDIAC CATHETERIZATION N/A 09/30/2016   Procedure: Left Heart Cath and Coronary Angiography;  Surgeon: [name], MD;  Location: [place] CV LAB;  Service: Cardiovascular;  Laterality: N/A;  . CARDIAC CATHETERIZATION N/A 09/30/2016   Procedure: Coronary Stent Intervention;  Surgeon: [name], MD;  3.5 x 12 mm resolute Onyx  to ostial RCA  . CATARACT EXTRACTION W/ INTRAOCULAR LENS  IMPLANT, BILATERAL Bilateral 2013  . CORONARY ANGIOPLASTY    . DILATION AND CURETTAGE OF UTERUS     "had fibroids" (03/15/2013)  . EXPLORATORY LAPAROTOMY     "had a growth on my intestines" (03/15/2013)  . HAMMER TOE SURGERY Right   . INSERT / REPLACE / REMOVE PACEMAKER  12/15/2006   Medtronic  . MITRAL VALVE REPLACEMENT  1998   St Jude mechanical; Dr. [name]  . PACEMAKER PLACEMENT  12/15/06   medtronic adapta for SSS  . PERMANENT PACEMAKER GENERATOR CHANGE N/A 07/24/2014   Procedure: PERMANENT PACEMAKER GENERATOR CHANGE;  Surgeon: [name], MD;  Location: [place] CATH LAB;  Service: Cardiovascular;  Laterality: N/A;  . PERSANTINE CARDIOLITE  08/07/03   mild inf. ischemia   . RADIOLOGY WITH ANESTHESIA N/A 02/17/2019   Procedure: IR WITH ANESTHESIA;  Surgeon: [name], MD;  Location: [place];  Service: Radiology;  Laterality: N/A;  . RIGHT HEART CATH N/A 02/06/2019   Procedure: RIGHT HEART CATH;  Surgeon: [name], MD;  Location: [place] CV LAB;  Service: Cardiovascular;  Laterality: N/A;  . TEE WITHOUT CARDIOVERSION  09/23/2011   Procedure: TRANSESOPHAGEAL ECHOCARDIOGRAM (TEE);  Surgeon: [name];  Location: [facility];  Service: Cardiovascular;  Laterality: N/A;  . TONSILLECTOMY    . TUBAL LIGATION    . US ECHOCARDIOGRAPHY  11/19/2011   EF 50-55%,RA mod to severely dilated,LA severely dilated,trace MR,small vegetation or mass on the MV,AOV mildly scleroticmild PI, RV pressure  40-32mmHg   HPI:  78 y/o who presented [facility] with left sided weakness, facial droop and slurred speech. CTA of the head/ neck showed acute occlusion of the right M1 segment, delayed collateral flow to the R M2 branches. Patient underwent a common carotid arteriogram followed by complete revascularization of the right MCA M1 occlusion. Intubated 5/29-5/31. CXR Stable right pleural effusion and bibasilar subsegmental atelectasis. CT of the head of 02/18/19 was negative for acute changes.    Assessment / Plan / Recommendation Clinical Impression  Pt reported that she is currently retired and lives with her husband. Per the pt, her husband currently manages their finances, does laundry, and cooks. She stated that she still manages her own medications but her day otherwise consists of her watching TV and resting. She denied any changes in speech, language or cognition. Her speech and language skills are currently within normal limits and no overt cognitive deficits were noted during this evaluation. Further skilled SLP services are not clinically indicated at this time for speech, language or cognition but SLP will follow to ensure tolerance of the recommended diet. Pt, and  nursing were educated regarding results and recommendations; both parties verbalized understanding as well as agreement with plan of care.    SLP Assessment  SLP Recommendation/Assessment: Patient does not need any further Speech Language Pathology Services(for speech, language or cognition)    Follow Up Recommendations  None    Frequency and Duration           SLP Evaluation Cognition  Overall Cognitive Status: Within Functional Limits for tasks assessed Arousal/Alertness: Awake/alert Orientation Level: Oriented X4 Attention: Sustained;Focused Focused Attention: Appears intact Sustained Attention: Appears intact Memory: Impaired Memory Impairment: Retrieval deficit;Decreased recall of new information(Immediate: 3/3;  Delayed: 1/3 with cues: 2/2) Awareness: Appears intact Problem Solving: Appears intact(5/5) Executive Function: Reasoning;Sequencing Reasoning: Impaired Reasoning Impairment: Verbal complex(Abstraction: 1/3; with cues: 2/2)       Comprehension  Auditory Comprehension Overall Auditory Comprehension: Appears within functional limits for tasks assessed Basic Biographical Questions: (5/5) Complex Questions: (5/5) Paragraph Comprehension (via yes/no questions): (3/4) Commands: Impaired Two Step Basic Commands: (4/4) Multistep Basic Commands: (3/4) Conversation: Complex Visual Recognition/Discrimination Discrimination: Within Function Limits Reading Comprehension Reading Status: Within funtional limits    Expression Expression Primary Mode of Expression: Verbal Verbal Expression Overall Verbal Expression: Appears within functional limits for tasks assessed Initiation: No impairment Automatic Speech: Counting;Day of week;Month of year(WNL) Level of Generative/Spontaneous Verbalization: Conversation Repetition: No impairment(5/5) Naming: No impairment Responsive: (5/5) Confrontation: Within functional limits(10/10) Convergent: (Sentence completion: 5/5) Pragmatics: No impairment   Oral / Motor  Oral Motor/Sensory Function Overall Oral Motor/Sensory Function: Within functional limits Motor Speech Overall Motor Speech: Appears within functional limits for tasks assessed Respiration: Within functional limits Phonation: Normal Resonance: Within functional limits Articulation: Within functional limitis Intelligibility: Intelligible Motor Planning: Witnin functional limits Motor Speech Errors: Not applicable   Felicie Kocher I. Hardin Negus, [place], [place] Office number [phone] Pager [place] 02/21/2019, 11:37 AM

## 2019-02-21 NOTE — Consult Note (Signed)
   [facility] Inpatient Consult   02/21/2019  Carrie [name] 10-07-1940 [id number]    Patient'schart has been reviewed for 30 day readmission, with 2 hospitalizations in past 6 months; has high risk score of 28%for unplanned readmission and as benefit from her United HealthcareMedicare plan. Noted that patient was previously active with [facility] care coordinator and [facility] SW in the past. Our community based plan of care has focused on disease management and community resource support.  Review of patient's medical record,show asfollows:  [name] is a 78 y.o. female with history of a-fib, mechanical heart valve, warfarin therapy, pacemaker, prior SDH, CAD, CHF, HLD, HTN, NSTEMI in 2017 and prior stroke--   presenting after she collapsed with right-sided gaze preference, left facial droop and left hemiplegia. She did not receive IV t-PA due to warfarin anticoagulation with INR 1.9. Stroke: suspected Rt MCA infarct with right M1 cut off s/p IR with TICI3 reperfusion, likely due to mechanical fall and A. fib with subtherapeutic INR She underwent a common carotid arteriogram followed by complete revascularization of the right MCA M1 occlusion. The patient was returned to ICU on mechanical ventilation and extubated on 5/31.   Her primary care provider is Dr. Lucianne Lei with [facility].  Per PT and OT notes reviewed, currentlyrecommending inpatientlevel rehab therapies ([facility]IR[facility]). Per Inpatient Rehab Admissions Coordinator note, patient's husband is hopeful that she will be able to go to CIR and is agreeable to providing 24/7 supervision at her discharge.  Will followalong fordisposition and if there are any changes in dispositionand needs for appropriate community follow-up, please referto [facility] care management.  Of note, [facility] Care Management services does not replace or interfere with any services arranged by transition of care CM or social work.   For  questions and additional information, please call:  Embrie Mikkelsen A. Jeriah Skufca, BSN, RN-BC [facility] Liaison Cell: [phone]

## 2019-02-21 NOTE — Plan of Care (Signed)
  Problem: Activity: Goal: Risk for activity intolerance will decrease Outcome: Progressing   Problem: Pain Managment: Goal: General experience of comfort will improve Outcome: Progressing   Problem: Safety: Goal: Ability to remain free from injury will improve Outcome: Progressing   Problem: Skin Integrity: Goal: Risk for impaired skin integrity will decrease Outcome: Progressing   Problem: Ischemic Stroke/TIA Tissue Perfusion: Goal: Complications of ischemic stroke/TIA will be minimized Outcome: Progressing

## 2019-02-21 NOTE — Progress Notes (Signed)
STROKE TEAM PROGRESS NOTE   SUBJECTIVE (INTERVAL HISTORY) Patient sitting in chair, continue to have mild coughing and shortness of breath. Expiratory wheezing can be heard. Repeat CXR showed vascular congestion with minimal pulmonary edema.  INR 1.8 today, still on heparin IV.  Pending CIR.   OBJECTIVE Vitals:   02/21/19 0749 02/21/19 0856 02/21/19 1156 02/21/19 1611  BP: (!) 99/56 (!) 104/57 112/70 (!) 107/52  Pulse: (!) 56 64 77 (!) 57  Resp: 17  16 18   Temp: 98.3 F (36.8 C)  98.1 F (36.7 C) 98.4 F (36.9 C)  TempSrc: Oral  Axillary Oral  SpO2: 96%  97% 96%  Weight:      Height:        CBC:  Recent Labs  Lab 02/17/19 1132  02/18/19 0314  02/20/19 0730 02/21/19 0400  WBC 3.7*  --  7.5   < > 6.9 6.6  NEUTROABS 2.3  --  5.4  --   --   --   HGB 9.4*   < > 8.8*   < > 8.6* 8.7*  HCT 29.9*   < > 25.9*   < > 26.1* 26.6*  MCV 93.4  --  85.5   < > 89.1 88.4  PLT 143*  --  175   < > 162 161   < > = values in this interval not displayed.    Basic Metabolic Panel:  Recent Labs  Lab 02/17/19 1500  02/20/19 0730 02/21/19 0400 02/21/19 0952  NA  --    < > 137 136 135  K  --    < > 3.1* 5.2* 4.4  CL  --    < > 106 107 105  CO2  --    < > 22 20* 22  GLUCOSE  --    < > 92 102* 120*  BUN  --    < > 24* 31* 31*  CREATININE  --    < > 1.13* 1.28* 1.23*  CALCIUM  --    < > 9.0 8.8* 9.1  MG 2.4  --  1.8  --   --   PHOS  --   --   --   --  2.2*   < > = values in this interval not displayed.    Lipid Panel:     Component Value Date/Time   CHOL 81 02/18/2019 0314   TRIG 34 02/19/2019 0358   HDL 28 (L) 02/18/2019 0314   CHOLHDL 2.9 02/18/2019 0314   VLDL 12 02/18/2019 0314   LDLCALC 41 02/18/2019 0314   HgbA1c:  Lab Results  Component Value Date   HGBA1C 5.2 02/18/2019   Urine Drug Screen:     Component Value Date/Time   LABOPIA NONE DETECTED 02/17/2019 1500   COCAINSCRNUR NONE DETECTED 02/17/2019 1500   LABBENZ NONE DETECTED 02/17/2019 1500   AMPHETMU NONE  DETECTED 02/17/2019 1500   THCU NONE DETECTED 02/17/2019 1500   LABBARB NONE DETECTED 02/17/2019 1500    Alcohol Level     Component Value Date/Time   ETH <10 02/17/2019 1132    IMAGING Ct Head Code Stroke Wo Contrast 02/17/2019 IMPRESSION:  1. No acute intracranial abnormality  2. ASPECTS is 10   Ct Angio Head W Or Wo Contrast Ct Angio Neck W Or Wo Contrast 02/17/2019 IMPRESSION:  1. Occlusion right M1 segment which appears acute. Delayed collateral flow to right M2 branches.  2. No significant carotid or vertebral artery stenosis in the neck.  3. Marked  enlargement of the superior ophthalmic vein bilaterally. No evidence of carotid cavernous fistula. Marked dilatation of the jugular veins bilaterally is chronic and likely due to elevated right heart pressures. The patient has history of cardiac valvular disease.   Ct Head Wo Contrast 02/17/2019 IMPRESSION:  No acute intracranial abnormality. Atrophy and chronic microvascular ischemic change.   Repeat CT Head WO Contrast 02/18/2019 No acute intracranial abnormality by noncontrast CT. Stable atrophy and chronic white matter microvascular changes.   Transthoracic Echocardiogram  02/03/2019 IMPRESSIONS  1. The left ventricle has normal systolic function with an ejection fraction of 60-65%. The cavity size was normal. There is moderately increased left ventricular wall thickness. Left ventricular diastolic function could not be evaluated due to mitral valve replacement/repair. There is abnormal septal motion consistent with RV pacemaker and right ventricular volume and pressure overload.  2. The right ventricle has moderately reduced systolic function. The cavity was moderately enlarged. There is no increase in right ventricular wall thickness.  3. Left atrial size was severely dilated.  4. Right atrial size was severely dilated.  5. A MVR mechanical valve is present in the mitral position.  6. The tricuspid valve is grossly normal.  Tricuspid valve regurgitation is moderate-severe.  7. The aortic valve is tricuspid. Mild calcification of the aortic valve. Aortic valve regurgitation is mild by color flow Doppler. Mild stenosis of the aortic valve.  8. The inferior vena cava was dilated in size with <50% respiratory variability.  9. The interatrial septum was not well visualized. 10. When compared to the prior study: 03/31/2018: LVEF 60-65%, RVSP 66 mmHg.  EKG - atrial fibrillation - ventricular response 68 BPM (See cardiology reading for complete details)   PHYSICAL EXAM  Temp:  [98.1 F (36.7 C)-98.7 F (37.1 C)] 98.4 F (36.9 C) (06/02 1611) Pulse Rate:  [56-97] 57 (06/02 1611) Resp:  [15-18] 18 (06/02 1611) BP: (99-119)/(52-94) 107/52 (06/02 1611) SpO2:  [95 %-99 %] 96 % (06/02 1611)  General - Well nourished, well developed, intermittent coughing.  Ophthalmologic - fundi not visualized due to noncooperation.  Cardiovascular - irregularly irregular heart rate and rhythm.  Neuro - awake alert orientated to time place and people.  Language fluent, following simple commands, intact naming and repetition. PERRL, EOMI, no gaze preference, visual field full, tracking bilaterally. Facial symmetry and tongue midline on protrusion. BUE 4/5 and BLE 3+/5. DTR 1+ and no babinski. Sensation symmetrical, coordination seems to have Left FTN mild dysmetria and gait not tested.   ASSESSMENT/PLAN [name] is a 78 y.o. female with history of a-fib, mechanincal heart valve, warfarin therapy, pacemaker, prior SDH, CAD, CHF, HLD, HTN, NSTEMI in 2017 and prior stroke presenting after she collapsed with right sided gaze preference, left facial droop and left hemiplegia. She did not receive IV t-PA due to warfarin anticoagulation with INR 1.9.  Stroke: suspected Rt MCA infarct with right M1 cut off s/p IR with TICI3 reperfusion, likely due to mechanical valve and A. fib with subtherapeutic INR  CT head - No acute intracranial  abnormality   Repeat Head CT -no acute intracranial abnormality  MRI head - PPM not compatible with MRI  CTA H&N - Occlusion right M1 segment which appears acute.   IR with TICI3 reperfusion  2D Echo - EF 60 - 65%. No cardiac source of emboli identified.   Sars Corona Virus 2  - negative  LDL - 41  HgbA1c - 5.2  INR 1.9 -> 1.7->1.8  UDS  - negative  VTE prophylaxis - IV hep->warfarin  warfarin daily prior to admission, on Heparin IV and Coumadin given mechanical valve and atrial fibrillation.  INR goal changed to 2.5-3.0 after discussion with Dr. Percival Spanish. Also per Dr. Percival Spanish, added aspirin to warfarin given class I indication.   Therapy recommendations:  CIR   Disposition:  Pending  Mechanical mitral valve  Followed by cardiology  On Coumadin PTA  INR 1.9 on admission  INR goal changed to 2.5-3.0 due to history of SDH and liver hemorrhage (normal condition, INR goal 2.5-3.5)  Currently on heparin IV with bridge to coumadin   Add ASA 81 based on class I recommendation  INR 1.9->1.7->1.8  Chronic A. Fib  On Coumadin PTA  Rate controlled   INR goal 2.5-3.0 due to history of SDH and liver hemorrhage  INR 1.9 on admission  Currently on heparin IV with bridge to coumadin   INR 1.9-> 1.7->1.8  History of [facility]  08/2011 patient admitted for mitral endocarditis, TEE showed vegetation on prosthetic mitral valve.  Also found to have small SAH suspected posttraumatic versus septic emboli, which subsequently resolved, put on heparin drip bridged to Coumadin.   Respiratory failure, resolved  Was intubated for procedure  Extubated 02/19/19  Repeat CXR today w/ vascula congestion and prob minimal pulm edema, R basilar atx  Reports to card SOB not at baseline  Will add bronchdilators  Cardiology on board, changed diuretics today to IV.   Hypotension History of hypertension  BP stable . Currently off neo . On Lasix and spironolactone->switch to IV  lasix today, possibly back to po in the am . BP goal normotensive . IVF discontinued  Hyperlipidemia  Lipid lowering medication PTA:  Lipitor 40 mg daily  LDL 41, goal < 70  Current lipid lowering medication:  Lipitor 40 mg daily  Continue statin at discharge  Anemia  Hemoglobin 9.4-10.5-8.8-9.1-8.6-8.7  Currently on IV heparin and coumadin  close CBC monitoring  PRBC transfusion if Hb < 7  Dysphagia   Extubated 5/31  Speech following  On diet  Other Stroke Risk Factors  Advanced age  Family hx stroke (father and brother)  Coronary artery disease, non-STEMI 2017  Migraines  CHF  Other Active Problems  CKD stage III creatinine - 1.38->1.40->1.29->1.31->1.13->1.23  Pacemaker  History of SDH  Hypokalemia - 3.5->2.8->3.1->5.2-4.4  Intermittent coughing - monitoring - CXR neg  Hospital day # 4  Rosalin Hawking, MD PhD Stroke Neurology 02/21/2019 4:49 PM  To contact Stroke Continuity provider, please refer to [email or web address] After hours, contact General Neurology

## 2019-02-21 NOTE — Progress Notes (Signed)
Pt has had   wheezing throughout her upper lung fields. PRN breathing treatments have been administered; which relieve symptoms. MD notified. IV lasix administered as ordered with PRN breathing treatment. Will continue to monitor.

## 2019-02-21 NOTE — Progress Notes (Signed)
ANTICOAGULATION CONSULT NOTE   Pharmacy Consult for heparin Indication: atrial fibrillation and mechanical valve  Allergies  Allergen Reactions  . Codeine Nausea And Vomiting    Patient Measurements: Height: 5\' 1"  (154.9 cm) Weight: 130 lb 1.1 oz (59 kg) IBW/kg (Calculated) : 47.8 Heparin Dosing Weight: 59kg  Vital Signs: Temp: 98.6 F (37 C) (06/02 0350) Temp Source: Oral (06/02 0350) BP: 100/64 (06/02 0350) Pulse Rate: 60 (06/02 0350)  Labs: Recent Labs    02/19/19 0358  02/19/19 1935 02/20/19 0730 02/21/19 0400  HGB 9.1*  --   --  8.6* 8.7*  HCT 26.7*  --   --  26.1* 26.6*  PLT 170  --   --  162 161  LABPROT  --   --   --  20.1* 20.8*  INR  --   --   --  1.7* 1.8*  HEPARINUNFRC <0.10*   < > 0.29* 0.34 0.37  CREATININE 1.31*  --   --  1.13* 1.28*   < > = values in this interval not displayed.    Estimated Creatinine Clearance: 30.4 mL/min (A) (by C-G formula based on SCr of 1.28 mg/dL (H)).  Medications:  Infusions:  . heparin 1,200 Units/hr (02/21/19 0600)    Assessment: 3 yof s/p MCA M1 occlusion s/p IR revascularization. She is on chronic warfarin for afib and mechanical valve. INR on admission was subtherapeutic at 1.9 so she was initiated on IV heparin as a bridge back to therapeutic warfarin.  INR this am 1.8 (low) - appears to have missed warfarin on 6/1 (no order found).  Hep lvl within goal 0.37  Goal of Therapy:  Heparin level 0.3-0.5 units/ml INR 2-2.5 Monitor platelets by anticoagulation protocol: Yes   Plan:  Continue Heparin gtt to 1200 units/hr - NO BOLUSES Warfarin 6 mg x 1 Daily Hep Lvl INR CBC  Sloan Leiter, PharmD, BCPS, BCCCP Clinical Pharmacist Clinical phone 02/21/2019 until 3:30 - [phone]  Please check AMION for all [place] numbers  02/21/2019 7:05 AM

## 2019-02-21 NOTE — Progress Notes (Signed)
Physical Therapy Treatment Patient Details Name: Carrie [name] MRN: [id number] DOB: April 17, 1941 Today's Date: 02/21/2019    History of Present Illness [name] who presented to [facility] 5/29 with left sided weakness, facial droop and slurred speech. 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 . The patient was taken to interventional radiology per Dr. 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 The patient was returned to ICU on mechanical ventilation, extubated 5/31    PT Comments    Pt moving slowly this morning and required increased time and min assist for all aspects of mobility. RW utilized instead of rollator because of this. Continue to feel CIR is appropriate - pt is likely able to tolerate more activity, however pt needed to have a bowel movement during session which caused a time limitation. Will continue to follow.    Follow Up Recommendations  CIR;Supervision/Assistance - 24 hour     Equipment Recommendations  None recommended by PT    Recommendations for Other Services Rehab consult     Precautions / Restrictions Precautions Precautions: Fall Precaution Comments: bil LE edema Restrictions Weight Bearing Restrictions: No    Mobility  Bed Mobility Overal bed mobility: Needs Assistance Bed Mobility: Supine to Sit     Supine to sit: Min assist     General bed mobility comments: Increased time and min assist to raise trunk and transition fully to EOB. HOB elevated.   Transfers Overall transfer level: Needs assistance Equipment used: Rolling walker (2 wheeled) Transfers: Sit to/from Stand Sit to Stand: Min assist         General transfer comment: Min assist to power-up to full stand. Transfer required increased time to complete - pt moving very slowly, especially to extend trunk to full stand.    Ambulation/Gait Ambulation/Gait assistance: Min assist Gait Distance (Feet): 25 Feet Assistive device: Rolling walker (2 wheeled) Gait Pattern/deviations: Step-through pattern;Trunk flexed;Narrow base of support;Decreased step length - right;Decreased step length - left;Shuffle Gait velocity: Very slow Gait velocity interpretation: <1.31 ft/sec, indicative of household ambulator General Gait Details: Assist for balance and support. Pt with flexed trunk as well as flexed knees. Verbal cues to improve posture and look up.   Stairs             Wheelchair Mobility    Modified Rankin (Stroke Patients Only) Modified Rankin (Stroke Patients Only) Pre-Morbid Rankin Score: Slight disability Modified Rankin: Moderately severe disability     Balance Overall balance assessment: Needs assistance Sitting-balance support: No upper extremity supported;Feet supported Sitting balance-Leahy Scale: Fair     Standing balance support: No upper extremity supported;During functional activity Standing balance-Leahy Scale: Poor                              Cognition Arousal/Alertness: Awake/alert Behavior During Therapy: WFL for tasks assessed/performed Overall Cognitive Status: No family/caregiver present to determine baseline cognitive functioning Area of Impairment: Problem solving;Safety/judgement;Awareness;Following commands                       Following Commands: Follows one step commands consistently;Follows one step commands with increased time;Follows multi-step commands inconsistently Safety/Judgement: Decreased awareness of safety Awareness: Emergent Problem Solving: Slow processing;Decreased initiation;Difficulty sequencing;Requires verbal cues;Requires tactile cues        Exercises  General Comments        Pertinent Vitals/Pain Pain Assessment: No/denies pain    Home Living                      Prior Function            PT  Goals (current goals can now be found in the care plan section) Acute Rehab PT Goals Patient Stated Goal: to get better PT Goal Formulation: With patient Time For Goal Achievement: 03/05/19 Potential to Achieve Goals: Good Progress towards PT goals: Progressing toward goals    Frequency    Min 4X/week      PT Plan Current plan remains appropriate    Co-evaluation              AM-PAC PT "6 Clicks" Mobility   Outcome Measure  Help needed turning from your back to your side while in a flat bed without using bedrails?: A Little Help needed moving from lying on your back to sitting on the side of a flat bed without using bedrails?: A Little Help needed moving to and from a bed to a chair (including a wheelchair)?: A Little Help needed standing up from a chair using your arms (e.g., wheelchair or bedside chair)?: A Little Help needed to walk in hospital room?: A Little Help needed climbing 3-5 steps with a railing? : A Lot 6 Click Score: 17    End of Session Equipment Utilized During Treatment: Gait belt Activity Tolerance: Patient tolerated treatment well Patient left: in chair;with call bell/phone within reach;Other (comment)(with OT present) Nurse Communication: Mobility status PT Visit Diagnosis: Unsteadiness on feet (R26.81);Other abnormalities of gait and mobility (R26.89);Muscle weakness (generalized) (M62.81)     Time: 3532-9924 PT Time Calculation (min) (ACUTE ONLY): 33 min  Charges:  $Gait Training: 23-37 mins                     Rolinda Roan, PT, DPT Acute Rehabilitation Services Pager: [phone] Office: [phone]    Thelma Comp 02/21/2019, 8:58 AM

## 2019-02-21 NOTE — Discharge Summary (Addendum)
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 Anticoagulated on Coumadin    for mech valve and atrial fib  goal 2.0-2.5  . Anxiety   . Arthritis    "right shoulder" (03/15/2013)  . Atrial fibrillation ([place])   . Atrial flutter ([place])   . CAD (coronary artery disease) 08/11/2016  . Chronic combined systolic and diastolic CHF (congestive heart failure) ([place])    a. 02/2016 Echo: EF 45-50%.  . Diverticula of colon   . GERD (gastroesophageal reflux disease)   . Hemorrhage intraabdominal 03/17/2012  . Hyperlipidemia   . Hypertension   . Liver hemorrhage   . Migraines   . 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 Bi-leaflet St. Jude mechanical prosthesis; b. 02/2016 Echo: EF 45-50%, some degree of MR, sev dil LA/RA, sev TR.  . NSTEMI (non-ST elevated myocardial infarction) ([place])    a. 02/2016 elev trop/Cath: nonobs dzs,   . Pacemaker    medtronic adapta  . Severe tricuspid regurgitation    a. 02/2016 Echo: Ef 45-50%, sev TR, PASP 37mmHg.  . Sick sinus syndrome ([place])    Dr Carrie [name]. EP study negative. Pacemaker 12/15/06 Medtronic  . Stroke [facility])    "they say I had a stroke  last year" denies residual on 03/15/2013  . Subdural hematoma [facility])    Past Surgical History:  Procedure Laterality Date  . APPENDECTOMY    . CARDIAC CATHETERIZATION  04/02/97   R&L:severe MR/pulmonary hypertension  . CARDIAC CATHETERIZATION N/A 03/16/2016   Procedure: Left Heart Cath and Coronary Angiography;  Surgeon: [name], MD;  Location: [place] CV LAB;  Service: Cardiovascular;  Laterality: N/A;  . CARDIAC CATHETERIZATION N/A 09/30/2016   Procedure: Left Heart Cath and Coronary Angiography;  Surgeon: [name], MD;  Location: [place] CV LAB;  Service: Cardiovascular;  Laterality: N/A;  . CARDIAC CATHETERIZATION N/A 09/30/2016   Procedure: Coronary Stent Intervention;  Surgeon: [name], MD;  3.5 x 12 mm resolute Onyx  to ostial RCA  . CATARACT EXTRACTION W/ INTRAOCULAR LENS  IMPLANT, BILATERAL Bilateral 2013  . CORONARY ANGIOPLASTY    . DILATION AND CURETTAGE OF UTERUS     "had fibroids" (03/15/2013)  . EXPLORATORY LAPAROTOMY     "had a growth on my intestines" (03/15/2013)  . HAMMER TOE SURGERY Right   . INSERT / REPLACE / REMOVE PACEMAKER  12/15/2006   Medtronic  . IR ANGIO VERTEBRAL SEL SUBCLAVIAN INNOMINATE UNI R MOD SED  02/17/2019  . IR CT HEAD LTD  02/17/2019  . IR PERCUTANEOUS ART THROMBECTOMY/INFUSION INTRACRANIAL INC DIAG ANGIO  02/17/2019  . MITRAL VALVE REPLACEMENT  1998   St Jude  mechanical; Dr. [name]  . PACEMAKER PLACEMENT  12/15/06   medtronic adapta for SSS  . PERMANENT PACEMAKER GENERATOR CHANGE N/A 07/24/2014   Procedure: PERMANENT PACEMAKER GENERATOR CHANGE;  Surgeon: [name], MD;  Location: [place] CATH LAB;  Service: Cardiovascular;  Laterality: N/A;  . PERSANTINE CARDIOLITE  08/07/03   mild inf. ischemia   . RADIOLOGY WITH ANESTHESIA N/A 02/17/2019   Procedure: IR WITH ANESTHESIA;  Surgeon: [name], MD;  Location: [place];  Service: Radiology;  Laterality: N/A;  . RIGHT HEART CATH N/A 02/06/2019   Procedure: RIGHT  HEART CATH;  Surgeon: [name], MD;  Location: [place] CV LAB;  Service: Cardiovascular;  Laterality: N/A;  . TEE WITHOUT CARDIOVERSION  09/23/2011   Procedure: TRANSESOPHAGEAL ECHOCARDIOGRAM (TEE);  Surgeon: [name];  Location: [facility] ENDOSCOPY;  Service: Cardiovascular;  Laterality: N/A;  . TONSILLECTOMY    . TUBAL LIGATION    . US ECHOCARDIOGRAPHY  11/19/2011   EF 50-55%,RA mod to severely dilated,LA severely dilated,trace MR,small vegetation or mass on the MV,AOV mildly scleroticmild PI, RV pressure 40-58mmHg    Medications to be continued on Rehab Allergies as of 02/24/2019      Reactions   Codeine Nausea And Vomiting      Medication List    STOP taking these medications   atorvastatin 40 MG tablet Commonly known as:  LIPITOR   dextromethorphan-guaiFENesin 30-600 MG 12hr tablet Commonly known as:  MUCINEX DM   docusate sodium 100 MG capsule Commonly known as:  COLACE Replaced by:  docusate 50 MG/5ML liquid   metolazone 2.5 MG tablet Commonly known as:  ZAROXOLYN   polyethylene glycol 17 g packet Commonly known as:  MIRALAX / GLYCOLAX   potassium chloride SA 20 MEQ tablet Commonly known as:  K-DUR     TAKE these medications   albuterol (2.5 MG/3ML) 0.083% nebulizer solution Commonly known as:  PROVENTIL Take 3 mLs (2.5 mg total) by nebulization every 6 (six) hours as needed for wheezing or shortness of breath.   aspirin 81 MG EC tablet Take 1 tablet (81 mg total) by mouth daily. Start taking on:  February 25, 2019   Biotin 1000 MCG tablet Take 1,000 mcg by mouth daily.   bisacodyl 10 MG suppository Commonly known as:  DULCOLAX Place 1 suppository (10 mg total) rectally daily as needed for moderate constipation.   docusate 50 MG/5ML liquid Commonly known as:  COLACE Take 10 mLs (100 mg total) by mouth 2 (two) times daily as needed for mild constipation. Replaces:  docusate sodium 100 MG capsule   feeding supplement (ENSURE ENLIVE) Liqd Take 237  mLs by mouth 2 (two) times daily between meals.   Fish Oil 1000 MG Caps Take 1,000 mg by mouth daily.   furosemide 80 MG tablet Commonly known as:  LASIX Take 1 tablet (80 mg total) by mouth 2 (two) times daily.   guaiFENesin 100 MG/5ML Soln Commonly known as:  ROBITUSSIN Take 5 mLs (100 mg total) by mouth every 6 (six) hours.   metoprolol tartrate 25 MG tablet Commonly known as:  LOPRESSOR Take 1 tablet (25 mg total) by mouth 2 (two) times daily.   morphine 2 MG/ML injection Inject 0.25-0.5 mLs (0.5-1 mg total) into the vein every 6 (six) hours as needed.   MULTIVITAMINS PO Take 1 tablet by mouth daily.   nitroGLYCERIN 0.4 MG SL tablet Commonly known as:  NITROSTAT Place 1 tablet (0.4 mg total) under the tongue every 5 (five) minutes as needed  for chest pain.   ondansetron 4 MG/2ML Soln injection Commonly known as:  ZOFRAN Inject 2 mLs (4 mg total) into the vein every 6 (six) hours as needed for nausea or vomiting.   pantoprazole 40 MG injection Commonly known as:  PROTONIX Inject 40 mg into the vein daily.   polyvinyl alcohol 1.4 % ophthalmic solution Commonly known as:  LIQUIFILM TEARS Place 1 drop into the left eye as needed for dry eyes.   senna-docusate 8.6-50 MG tablet Commonly known as:  Senokot-S Take 1 tablet by mouth at bedtime as needed for mild constipation.   spironolactone 25 MG tablet Commonly known as:  ALDACTONE Take 0.5 tablets (12.5 mg total) by mouth daily.   warfarin 5 MG tablet Commonly known as:  COUMADIN Take as directed. If you are unsure how to take this medication, talk to your nurse or doctor. Original instructions:  Dose per pharmacist on [facility] while on rehab unit. They will adjust What changed:  additional instructions   warfarin 5 MG tablet Commonly known as:  COUMADIN Take as directed. If you are unsure how to take this medication, talk to your nurse or doctor. Original instructions:  Take 1 tablet (5 mg total) by mouth one time  only at 6 PM. What changed:  You were already taking a medication with the same name, and this prescription was added. Make sure you understand how and when to take each.       LABORATORY STUDIES CBC    Component Value Date/Time   WBC 5.6 02/24/2019 0338   RBC 2.80 (L) 02/24/2019 0338   HGB 8.2 (L) 02/24/2019 0338   HGB 9.6 (L) 12/17/2016 1057   HCT 24.6 (L) 02/24/2019 0338   HCT 29.2 (L) 12/17/2016 1057   PLT 173 02/24/2019 0338   PLT 158 12/17/2016 1057   MCV 87.9 02/24/2019 0338   MCV 84.9 12/17/2016 1057   MCH 29.3 02/24/2019 0338   MCHC 33.3 02/24/2019 0338   RDW 25.9 (H) 02/24/2019 0338   RDW 28.8 (H) 12/17/2016 1057   LYMPHSABS 0.8 02/18/2019 0314   LYMPHSABS 0.8 (L) 12/17/2016 1057   MONOABS 1.2 (H) 02/18/2019 0314   MONOABS 0.7 12/17/2016 1057   EOSABS 0.1 02/18/2019 0314   EOSABS 0.1 12/17/2016 1057   EOSABS 0.2 03/14/2010 1318   BASOSABS 0.0 02/18/2019 0314   BASOSABS 0.1 12/17/2016 1057   CMP    Component Value Date/Time   NA 136 02/24/2019 0338   NA 142 01/25/2019 1145   NA 139 12/03/2014 0959   K 3.5 02/24/2019 0338   K 3.9 12/03/2014 0959   CL 100 02/24/2019 0338   CL 100 03/14/2010 1318   CO2 26 02/24/2019 0338   CO2 26 12/03/2014 0959   GLUCOSE 97 02/24/2019 0338   GLUCOSE 77 12/03/2014 0959   GLUCOSE 101 03/14/2010 1318   BUN 31 (H) 02/24/2019 0338   BUN 38 (H) 01/25/2019 1145   BUN 18.7 12/03/2014 0959   CREATININE 1.19 (H) 02/24/2019 0338   CREATININE 0.95 (H) 10/27/2016 1546   CREATININE 0.9 12/03/2014 0959   CALCIUM 9.3 02/24/2019 0338   CALCIUM 10.5 (H) 12/03/2014 0959   PROT 8.0 02/17/2019 1132   PROT 7.9 06/06/2018 1407   PROT 8.9 (H) 12/03/2014 0959   ALBUMIN 2.8 (L) 02/21/2019 0952   ALBUMIN 4.1 06/06/2018 1407   ALBUMIN 4.0 12/03/2014 0959   AST 98 (H) 02/17/2019 1132   AST 58 (H) 12/03/2014 0959   ALT 23 02/17/2019 1132  ALT 24 12/03/2014 0959   ALKPHOS 129 (H) 02/17/2019 1132   ALKPHOS 106 12/03/2014 0959   BILITOT  1.2 02/17/2019 1132   BILITOT 1.5 (H) 06/06/2018 1407   BILITOT 0.50 12/03/2014 0959   GFRNONAA 44 (L) 02/24/2019 0338   GFRAA 51 (L) 02/24/2019 0338   COAGS Lab Results  Component Value Date   INR 1.6 (H) 02/24/2019   INR 1.6 (H) 02/23/2019   INR 1.5 (H) 02/22/2019   Lipid Panel    Component Value Date/Time   CHOL 81 02/18/2019 0314   TRIG 34 02/19/2019 0358   HDL 28 (L) 02/18/2019 0314   CHOLHDL 2.9 02/18/2019 0314   VLDL 12 02/18/2019 0314   LDLCALC 41 02/18/2019 0314   HgbA1C  Lab Results  Component Value Date   HGBA1C 5.2 02/18/2019   Urinalysis    Component Value Date/Time   COLORURINE STRAW (A) 02/17/2019 1500   APPEARANCEUR CLEAR 02/17/2019 1500   LABSPEC 1.016 02/17/2019 1500   PHURINE 7.0 02/17/2019 1500   GLUCOSEU NEGATIVE 02/17/2019 1500   HGBUR SMALL (A) 02/17/2019 1500   BILIRUBINUR NEGATIVE 02/17/2019 1500   KETONESUR NEGATIVE 02/17/2019 1500   PROTEINUR NEGATIVE 02/17/2019 1500   UROBILINOGEN 0.2 03/15/2013 1323   NITRITE NEGATIVE 02/17/2019 1500   LEUKOCYTESUR MODERATE (A) 02/17/2019 1500   Urine Drug Screen     Component Value Date/Time   LABOPIA NONE DETECTED 02/17/2019 1500   COCAINSCRNUR NONE DETECTED 02/17/2019 1500   LABBENZ NONE DETECTED 02/17/2019 1500   AMPHETMU NONE DETECTED 02/17/2019 1500   THCU NONE DETECTED 02/17/2019 1500   LABBARB NONE DETECTED 02/17/2019 1500    Alcohol Level    Component Value Date/Time   ETH <10 02/17/2019 1132     SIGNIFICANT DIAGNOSTIC STUDIES Ct Head Code Stroke Wo Contrast 02/17/2019 IMPRESSION:  1. No acute intracranial abnormality  2. ASPECTS is 10   Ct Angio Head W Or Wo Contrast Ct Angio Neck W Or Wo Contrast 02/17/2019 IMPRESSION:  1. Occlusion right M1 segment which appears acute. Delayed collateral flow to right M2 branches.  2. No significant carotid or vertebral artery stenosis in the neck.  3. Marked enlargement of the superior ophthalmic vein bilaterally. No evidence of carotid  cavernous fistula. Marked dilatation of the jugular veins bilaterally is chronic and likely due to elevated right heart pressures. The patient has history of cardiac valvular disease.   Ct Head Wo Contrast 02/17/2019 IMPRESSION:  No acute intracranial abnormality. Atrophy and chronic microvascular ischemic change.   Repeat CT Head WO Contrast 02/18/2019 IMPRESSION:  No acute intracranial abnormality by noncontrast CT. Stable atrophy and chronic white matter microvascular changes.   Ct Head Wo Contrast 02/22/2019 1132 IMPRESSION:  Unusual acute posterior fossa hemorrhage. In the right posterior fossa subdural location is favored and in the prepontine cistern clot appears subarachnoid. There is cerebellar mass effect with patent fourth ventricle.  Ct Head Wo Contrast 02/22/2019 1800 IMPRESSION:  Stable to slightly improved RIGHT posterior fossa extra-axial hematoma, favored to be subdural. Continued surveillance is warranted.  Ct Head Wo Contrast 02/24/2019 1800 The extra-axial hematoma along the floor of the posterior fossa on the right is becoming smaller and less dense. Maximal thickness measured at 5 mm presently. No significant mass effect. Small amount of blood dependent in the occipital horns of the lateral ventricles. No hydrocephalus.  Transthoracic Echocardiogram  02/03/2019 IMPRESSIONS 1. The left ventricle has normal systolic function with an ejection fraction of 60-65%. The cavity size was normal. There is moderately  increased left ventricular wall thickness. Left ventricular diastolic function could not be evaluated due to mitral valve replacement/repair. There is abnormal septal motion consistent with RV pacemaker and right ventricular volume and pressure overload. 2. The right ventricle has moderately reduced systolic function. The cavity was moderately enlarged. There is no increase in right ventricular wall thickness. 3. Left atrial size was severely  dilated. 4. Right atrial size was severely dilated. 5. A MVR mechanical valve is present in the mitral position. 6. The tricuspid valve is grossly normal. Tricuspid valve regurgitation is moderate-severe. 7. The aortic valve is tricuspid. Mild calcification of the aortic valve. Aortic valve regurgitation is mild by color flow Doppler. Mild stenosis of the aortic valve. 8. The inferior vena cava was dilated in size with <50% respiratory variability. 9. The interatrial septum was not well visualized. 10. When compared to the prior study: 03/31/2018: LVEF 60-65%, RVSP 66 mmHg.  CXR 02/23/2019 Mild vascular congestion and small right pleural effusion. 02/22/2019 Stable cardiomegaly and vascular congestion with small right pleural effusion.  EKG - atrial fibrillation - ventricular response 68 BPM (See cardiology reading for complete details)     HISTORY OF PRESENT ILLNESS Carrie [name] is an 78 y.o. female who presents as a Code Stroke with sudden onset of right hemiplegia. Time of symptom onset was witnessed by family at 17 on 02/17/2019. She got up from seated position, was able to walk a few steps, then collapsed. She has atrial fibrillation and a mechanical heart valve, anticoagulated with warfarin. Per EMS, she had an LVO score of 6 with findings including right sided weakness. On arrival to the ED she had right sided gaze preference, left facial droop and left hemiplegia. The patient was anxious and agitated but with no specific complaints.    She has an extensive PMHx. Her stroke risk factors include a-fib, mechanincal heart valve, CAD, CHF, HLD, HTN, NSTEMI in 2017 and prior stroke. She has a pacemaker and a history of subdural hematoma as well. She was not given tPA as she is o, on warfarin with INR of 1.9. mRS = 0 based on history provided by [place] Carrie [name] is a 78 y.o. female with history of a-fib, mechanincal heart valve, warfarin therapy, pacemaker,  prior SDH, CAD, CHF, HLD, HTN, NSTEMI in 2017 and prior stroke presenting after she collapsed with right sided gaze preference, left facial droop and left hemiplegia. She did not receive IV t-PA due to warfarin anticoagulation with INR 1.9.  Stroke: suspected Rt MCA infarct with right M1 cut off s/p IR with TICI3 reperfusion, likely due to mechanical valve and A. fib with subtherapeutic INR. Developed R posterior fossa SDH and prepontine SAH  Code Stroke CT head - No acute intracranial abnormality. APECTS 10   MRI head - PPM not compatible with MRI  CTA H&N - Occlusion right M1 segment which appears acute.   IR with TICI3 reperfusion  Repeat Head CT 5/29 -no acute intracranial abnormality  Repeat Head CT 5/30 -no acute intracranial abnormality  Patient with vomiting 6/3  Head CT 6/3 1132 -unusual acute posterior fossa SDH and prepontine cistern SAH. Cerebellar mass effect on 4th ventricle  Repeat Heat CT 6/3 1800 showed stable/improved SDH/SAH  INR 1.9 -> 1.7->1.8->1.5->1.6  Protamine given  Repeat Heat CT 6/5 posterior fossa hmg decreasing now 31mm. No mass effect. Small hmg in occipital horns of the lat ventricle  2D Echo - EF 60 - 65%. No cardiac source of  emboli identified.   Sars Corona Virus 2  - negative  LDL - 41  HgbA1c - 5.2  UDS  - negative  warfarin daily prior to admission, in hospital treated with Heparin IV and Coumadin given mechanical valve and atrial fibrillation with INR goal changed to 2.5-3.0 after discussion with Dr. Percival Spanish along with addition of aspirin to warfarin given class I indication. However, all stopped with new hmg inpatient. Once hemorrhage resolved,  restarted aspirin 81 and coumadin with INR goal now 2.0-2.5.   Therapy recommendations:  CIR   Disposition:  CIR  SDH and SAH  Headache with nausea vomiting  CT stat 6/3 showed right posterior fossa SDH with perimesencephalic SAH  Heparin reversed with protamine  Coumadin and  aspirin discontinued  Repeat CT 6/3 showed stable/improved SDH and SAH  Repeat Heat CT 6/5 posterior fossa hmg decreasing now 51mm. No mass effect. Small hmg in occipital horns of the lat ventricle  INR 1.9 -> 1.7->1.8->1.5->1.6->1.6  Continue monitoring  Etiology likely due to coughing in the setting of severe increased venous pressure given significantly engorged IJ on CTA neck and history of pulmonary hypertension  Continue guanfacine every 6 hours scheduled  Restarted aspirin 81 and coumadin 6/5, goal INR 2.0-2.5  Mechanical mitral valve  Followed by cardiology  On Coumadin PTA  INR 1.9 on admission  INR goal changed to 2.0-2.5 due to history of SDH and liver hemorrhage along with SDH and SAH d/t severe coughing this admission (normally INR goal would be 2.5-3.5)  On ASA 81 based on class I recommendation  INR 1.9->1.7->1.8->1.5->1.6->1.6  Pharmacy dosing warfarin - goal INR 2.0-2.5  Chronic A. 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  Also found to have small SAH suspected posttraumatic versus septic emboli, which subsequently resolved, put on heparin drip bridged to Coumadin.   Respiratory failure, resolved  Was intubated for procedure  Extubated 02/19/19  CXRyesterdayw/ vascularcongestion and prob minimal pulm edema, R basilar atx  Repeat CXR today stable vascular congestion, small R pericardial effusion  Onbronchdilators  Cardiology on board - will follow on rehab  Hypoxia w/ new cough and hemoptysis  Likely etiology aspiration w/ severe cough  Improving  Prn morphine  Trend fever and WBC  No abx at this time  If worsens, CT chest and reconsult  CCM  Hypotension History of hypertension  BP stable  off neo  On Lasix and spironolactone  BP goal normotensive  Hyperlipidemia  Lipid lowering medication PTA:  Lipitor 40 mg daily  LDL 41, goal < 70  Current lipid lowering medication:  Lipitor 40 mg daily  Given low LDL and repeat hmg, will discontinue statin d/t risk  Follow up in 6-8 weeks  Anemia  Hemoglobin 9.4-10.5-8.8-9.1-8.6-8.7-8.5-8.2  Restarted aspirin 81 and coumadin 6/5, goal INR 2.0-2.5  close CBC monitoring  PRBC transfusion if Hb < 7  Dysphagia   Extubated 5/31  Speech following  On diet  On Ensure Enlive  Other Stroke Risk Factors  Advanced age  Family hx stroke (father and brother)  Coronary artery disease, non-STEMI 2017  Migraines  CHF  Other Active Problems  CKD stage III creatinine - 1.38->1.40->1.29->1.31->1.13->1.23>1.16->1.21->1.19  Pacemaker  History of SDH  Hypokalemia -  3.5->2.8->3.1->5.2-4.4-4.7-3.9-3.5  DISCHARGE EXAM Blood pressure (!) 119/57, pulse 81, temperature 98 F (36.7 C), temperature source Oral, resp. rate (!) 26, height 5\' 1"  (1.549 m), weight 58.8 kg, SpO2 95 %. General - Well nourished, well developed, mild lethargy but not in distress  Ophthalmologic - fundi not visualized due to noncooperation.  Cardiovascular - irregularly irregular heart rate and rhythm.  Neuro - mild lethargy, but awake alert orientated to time place and people.  Language fluent, following simple commands, intact naming and repetition. PERRL, EOMI, no gaze preference, visual field full, tracking bilaterally. Facial symmetry and tongue midline on protrusion. BUE 4/5 and BLE 3+/5. DTR 1+ and no babinski. Sensation symmetrical, coordination seems to have Left FTN mild dysmetria and gait not tested.  Discharge Diet  Heart healthy thin liquids  DISCHARGE PLAN  Disposition:  Transfer to [place] for ongoing PT, OT and ST  warfarin daily for  secondary stroke prevention, goal INR 2.0-2.5, phamacy dosing AND aspirin 81 mg daiy  Monitor INR, CBC  Recommend ongoing stroke risk factor control by Primary Care Physician at time of discharge from inpatient rehabilitation.  Follow-up Carrie Lei, MD in 2 weeks following discharge from rehab.  Follow-up in [facility] in 4 weeks following discharge from rehab, office to schedule an appointment.   40 minutes were spent preparing discharge.  Rosalin Hawking, MD PhD Stroke Neurology 02/24/2019 6:40 PM

## 2019-02-21 NOTE — Consult Note (Addendum)
Cardiology Consultation:   Patient ID: [name] MRN: [id number]; DOB: Jan 21, 1941  Admit date: 02/17/2019 Date of Consult: 02/21/2019  Primary Care Provider: Lucianne Lei, MD Primary Cardiologist: [name], MD  Patient Profile:   Carrie [name] is a 78 y.o. 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 Erlinda Hong.   Admitted 02/03/19 with acute on Chronic Diastolic CHF with biventricular HF. Echo with normal LV function. Well functioning MVR. RV is dilated with moderate HK and evidence of RV pressure volume overload.Marland Kitchen RVSP estimated at 48mm HG but suspected it is higher.RHC during admission showed mild to moderate PAH and severe TR. Unfortunately not candidate for TV clip due to PM wire. The patient was followed by Dr. Haroldine Laws during admission.   History of Present Illness:   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 fib with subtherapeutic INR of 1.9. She did not receive IV t-PA due to warfarin. Treated with IV heparin and coumadin. Extubated 5/31. INR of 1.7 today.  Cardiology is consulted today for shortness of breath.  However, patient reports her shortness of breath is chronic and has been gradually worsened since discharge 2 weeks ago.  Patient sleeps on recliner chronically.  Denies lower extremity edema, chest pain, palpitation or melena.  Review of telemetry shows atrial fibrillation with rate mostly in 70,  intermittently goes to 100s-personally reviewed.  Past Medical History:  Diagnosis Date   Anticoagulated on Coumadin    for mech valve and atrial fib  goal 2.0-2.5   Anxiety    Arthritis    "right shoulder" (03/15/2013)   Atrial fibrillation (HCC)    Atrial flutter (HCC)    CAD (coronary artery disease) 08/11/2016   CHF (congestive heart failure) (HCC)    Chronic combined systolic and diastolic CHF (congestive heart failure) ([place])    a. 02/2016 Echo: EF 45-50%.   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 Bi-leaflet St. Jude mechanical prosthesis; b. 02/2016 Echo: EF 45-50%, some degree of MR, sev dil LA/RA, sev TR.   NSTEMI (non-ST elevated myocardial infarction) ([place])    a. 02/2016 elev trop/Cath: nonobs dzs,    Pacemaker    medtronic adapta   Pneumonia 2009   resolved.? OPD Rx   Severe tricuspid regurgitation    a. 02/2016 Echo: Ef 45-50%, sev TR, PASP 82mmHg.   Sick sinus syndrome (HCC)    Dr Cristopher Peru. EP study negative. Pacemaker 12/15/06 Medtronic   Stroke [facility])    "they say I had a stroke last year" denies residual on 03/15/2013   Subdural hematoma ([place])  Past Surgical History:  Procedure Laterality Date   APPENDECTOMY     CARDIAC CATHETERIZATION  04/02/97   R&L:severe MR/pulmonary hypertension   CARDIAC CATHETERIZATION N/A 03/16/2016   Procedure: Left Heart Cath and Coronary Angiography;  Surgeon: [name], MD;  Location: [place] CV LAB;  Service: Cardiovascular;  Laterality: N/A;   CARDIAC CATHETERIZATION N/A 09/30/2016   Procedure: Left Heart Cath and Coronary Angiography;  Surgeon: [name], MD;  Location: [place] CV [facility];   Service: Cardiovascular;  Laterality: N/A;   CARDIAC CATHETERIZATION N/A 09/30/2016   Procedure: Coronary Stent Intervention;  Surgeon: [name], MD;  3.5 x 12 mm resolute Onyx  to ostial RCA   CATARACT EXTRACTION W/ INTRAOCULAR LENS  IMPLANT, BILATERAL Bilateral 2013   CORONARY ANGIOPLASTY     DILATION AND CURETTAGE OF UTERUS     "had fibroids" (03/15/2013)   EXPLORATORY LAPAROTOMY     "had a growth on my intestines" (03/15/2013)   HAMMER TOE SURGERY Right    INSERT / REPLACE / REMOVE PACEMAKER  12/15/2006   Medtronic   IR CT HEAD LTD  02/17/2019   IR PERCUTANEOUS ART THROMBECTOMY/INFUSION INTRACRANIAL INC DIAG ANGIO  02/17/2019   MITRAL VALVE REPLACEMENT  1998   St Jude mechanical; Dr. [name]   PACEMAKER PLACEMENT  12/15/06   medtronic adapta for SSS   PERMANENT PACEMAKER GENERATOR CHANGE N/A 07/24/2014   Procedure: PERMANENT PACEMAKER GENERATOR CHANGE;  Surgeon: [name], MD;  Location: [place] CATH LAB;  Service: Cardiovascular;  Laterality: N/A;   PERSANTINE CARDIOLITE  08/07/03   mild inf. ischemia    RADIOLOGY WITH ANESTHESIA N/A 02/17/2019   Procedure: IR WITH ANESTHESIA;  Surgeon: [name], MD;  Location: [place];  Service: Radiology;  Laterality: N/A;   RIGHT HEART CATH N/A 02/06/2019   Procedure: RIGHT HEART CATH;  Surgeon: [name], MD;  Location: [place] CV LAB;  Service: Cardiovascular;  Laterality: N/A;   TEE WITHOUT CARDIOVERSION  09/23/2011   Procedure: TRANSESOPHAGEAL ECHOCARDIOGRAM (TEE);  Surgeon: [name];  Location: [facility];  Service: Cardiovascular;  Laterality: N/A;   TONSILLECTOMY     TUBAL LIGATION     US ECHOCARDIOGRAPHY  11/19/2011   EF 50-55%,RA mod to severely dilated,LA severely dilated,trace MR,small vegetation or mass on the MV,AOV mildly scleroticmild PI, RV pressure 40-29mmHg     Inpatient Medications: Scheduled Meds:  atorvastatin  40 mg Oral q1800   Chlorhexidine Gluconate Cloth  6 each  Topical Daily   famotidine  20 mg Oral Daily   feeding supplement (ENSURE ENLIVE)  237 mL Oral BID BM   furosemide  80 mg Oral BID   metoprolol tartrate  25 mg Oral BID   spironolactone  12.5 mg Oral Daily   warfarin  6 mg Oral ONCE-1800   Warfarin - Pharmacist Dosing Inpatient   Does not apply q1800   Continuous Infusions:  heparin 1,200 Units/hr (02/21/19 0600)   PRN Meds: acetaminophen **OR** [DISCONTINUED] acetaminophen (TYLENOL) oral liquid 160 mg/5 mL **OR** acetaminophen, albuterol, bisacodyl, docusate, nitroGLYCERIN, ondansetron (ZOFRAN) IV, polyvinyl alcohol, senna-docusate  Allergies:    Allergies  Allergen Reactions   Codeine Nausea And Vomiting    Social History:   Social History   Socioeconomic History   Marital status: Married    Spouse name: Not on file   Number of children: Not on file   Years of education: Not on file   Highest education level: Not on file  Occupational History  Not on file  Social Needs   Financial resource strain: Not on file   Food insecurity:    Worry: Not on file    Inability: Not on file   Transportation needs:    Medical: Not on file    Non-medical: Not on file  Tobacco Use   Smoking status: Never Smoker   Smokeless tobacco: Never Used  Substance and Sexual Activity   Alcohol use: No   Drug use: No   Sexual activity: Never  Lifestyle   Physical activity:    Days per week: Not on file    Minutes per session: Not on file   Stress: Not on file  Relationships   Social connections:    Talks on phone: Not on file    Gets together: Not on file    Attends religious service: Not on file    Active member of club or organization: Not on file    Attends meetings of clubs or organizations: Not on file    Relationship status: Not on file   Intimate partner violence:    Fear of current or ex partner: Not on file    Emotionally abused: Not on file    Physically abused: Not on file    Forced sexual  activity: Not on file  Other Topics Concern   Not on file  Social History Narrative   Not on file    Family History:    Family History  Problem Relation Age of Onset   Cancer Mother    Stroke Father    Cancer Sister    Leukemia Sister    Healthy Brother    Healthy Sister    Diabetes Sister    Healthy Brother    Healthy Brother    Heart attack Maternal Grandfather    Kidney disease Daughter    Stroke Brother      ROS:  Please see the history of present illness.  All other ROS reviewed and negative.     Physical Exam/Data:   Vitals:   02/21/19 0350 02/21/19 0749 02/21/19 0856 02/21/19 1156  BP: 100/64 (!) 99/56 (!) 104/57 112/70  Pulse: 60 (!) 56 64 77  Resp: 15 17  16   Temp: 98.6 F (37 C) 98.3 F (36.8 C)  98.1 F (36.7 C)  TempSrc: Oral Oral  Axillary  SpO2: 99% 96%  97%  Weight:      Height:        Intake/Output Summary (Last 24 hours) at 02/21/2019 1413 Last data filed at 02/21/2019 0810 Gross per 24 hour  Intake 554.69 ml  Output 800 ml  Net -245.31 ml   Last 3 Weights 02/17/2019 02/07/2019 02/06/2019  Weight (lbs) 130 lb 1.1 oz 132 lb 3.2 oz 133 lb 1.6 oz  Weight (kg) 59 kg 59.966 kg 60.374 kg     Body mass index is 24.58 kg/m.  General: Frail ill-appearing female in no acute distress HEENT: normal Lymph: no adenopathy Neck: no JVD Endocrine:  No thryomegaly Vascular: No carotid bruits; FA pulses 2+ bilaterally without bruits  Cardiac:  normal S1, S2; irregularly irregular, +  murmur  Lungs:  clear to auscultation bilaterally, no wheezing, rhonchi or rales  Abd: soft, nontender, no hepatomegaly  Ext: no edema Musculoskeletal:  No deformities, BUE and BLE strength normal and equal Skin: warm and dry  Neuro:  CNs 2-12 intact, no focal abnormalities noted Psych:  Normal affect   EKG:  The EKG was personally reviewed and demonstrates:  afib at  rate of 68 bpm  Relevant CV Studies:  RIGHT HEART CATH  02/06/19  Conclusion    Findings:  RA = 27 (v waves to 40 with ventricularized wave form) RV = 54/18 PA =69/23 (39) PCW = 19 (v = 23) Fick cardiac output/index = 5.6/3.5 PVR = 3.5 WU FA sat = 99% PA sat = 67%, 69% SVC sat = 73%  Assessment/Plan:  She is well diuresed. She had mild-moderate PAH but main issues is severe TR. She is not candidate for surgical TVR. Could consider experimental TV clip but currently not enrolling patient with pacemaker wires. Continue medical therapy.   Possibly home in am. If creatinine stable.  D/w Dr. William Hamburger.   Glori Bickers, MD     Echo 02/03/2019  IMPRESSIONS    1. The left ventricle has normal systolic function with an ejection fraction of 60-65%. The cavity size was normal. There is moderately increased left ventricular wall thickness. Left ventricular diastolic function could not be evaluated due to mitral  valve replacement/repair. There is abnormal septal motion consistent with RV pacemaker and right ventricular volume and pressure overload.  2. The right ventricle has moderately reduced systolic function. The cavity was moderately enlarged. There is no increase in right ventricular wall thickness.  3. Left atrial size was severely dilated.  4. Right atrial size was severely dilated.  5. A MVR mechanical valve is present in the mitral position.  6. The tricuspid valve is grossly normal. Tricuspid valve regurgitation is moderate-severe.  7. The aortic valve is tricuspid. Mild calcification of the aortic valve. Aortic valve regurgitation is mild by color flow Doppler. Mild stenosis of the aortic valve.  8. The inferior vena cava was dilated in size with <50% respiratory variability.  9. The interatrial septum was not well visualized. 10. When compared to the prior study: 03/31/2018: LVEF 60-65%, RVSP 66 mmHg.  Laboratory Data:  Chemistry Recent Labs  Lab 02/20/19 0730 02/21/19 0400 02/21/19 0952  NA 137 136 135  K 3.1* 5.2* 4.4  CL 106 107  105  CO2 22 20* 22  GLUCOSE 92 102* 120*  BUN 24* 31* 31*  CREATININE 1.13* 1.28* 1.23*  CALCIUM 9.0 8.8* 9.1  GFRNONAA 47* 40* 42*  GFRAA 54* 47* 49*  ANIONGAP 9 9 8     Recent Labs  Lab 02/17/19 1132 02/21/19 0952  PROT 8.0  --   ALBUMIN 3.3* 2.8*  AST 98*  --   ALT 23  --   ALKPHOS 129*  --   BILITOT 1.2  --    Hematology Recent Labs  Lab 02/19/19 0358 02/20/19 0730 02/21/19 0400  WBC 7.6 6.9 6.6  RBC 3.12* 2.93* 3.01*  HGB 9.1* 8.6* 8.7*  HCT 26.7* 26.1* 26.6*  MCV 85.6 89.1 88.4  MCH 29.2 29.4 28.9  MCHC 34.1 33.0 32.7  RDW 26.1* 25.4* 25.2*  PLT 170 162 161   Radiology/Studies:  Dg Chest 1 View  Result Date: 02/18/2019 CLINICAL DATA:  Respiratory failure EXAM: CHEST  1 VIEW COMPARISON:  02/17/2019 FINDINGS: Endotracheal tube and NG tube are stable. Single lead left subclavian pacemaker device is stable. The heart remains markedly enlarged. Normal pulmonary vascularity. No Kerley B lines. Minimal subsegmental atelectasis at the left base is stable. Small right pleural effusion and basilar atelectasis are stable. No pneumothorax. IMPRESSION: Stable right pleural effusion and bibasilar subsegmental atelectasis. Electronically Signed   By: Marybelle Killings M.D.   On: 02/18/2019 10:05   Ct Head Wo Contrast  Result Date:  02/18/2019 CLINICAL DATA:  Acute stroke, follow-up EXAM: CT HEAD WITHOUT CONTRAST TECHNIQUE: Contiguous axial images were obtained from the base of the skull through the vertex without intravenous contrast. COMPARISON:  02/17/2019 FINDINGS: Brain: Stable atrophy and chronic white matter microvascular changes. No acute intracranial hemorrhage, definite new infarction, mass lesion, midline shift, herniation, hydrocephalus, or extra-axial fluid collection. No focal mass effect or edema. Gray-white matter differentiation maintained. Cisterns are patent. Cerebellar atrophy as well. Vascular: Intracranial atherosclerosis.  No hyperdense vessel. Skull: Normal.  Negative for fracture or focal lesion. Sinuses/Orbits: Scattered sinus air-fluid levels may be related to recent intubation and NG tube. Mastoids are clear. Orbits unremarkable. Other: None. IMPRESSION: No acute intracranial abnormality by noncontrast CT. Stable atrophy and chronic white matter microvascular changes. Electronically Signed   By: Jerilynn Mages.  Shick M.D.   On: 02/18/2019 18:00   Ct Head Wo Contrast  Result Date: 02/17/2019 CLINICAL DATA:  Altered level of consciousness. Patient status post cerebral angiogram and treatment of a right MCA embolus today. EXAM: CT HEAD WITHOUT CONTRAST TECHNIQUE: Contiguous axial images were obtained from the base of the skull through the vertex without intravenous contrast. COMPARISON:  Head CT scan 02/17/2019. FINDINGS: Brain: No evidence of acute infarction, hemorrhage, hydrocephalus, extra-axial collection or mass lesion/mass effect. Cortical atrophy and chronic microvascular ischemic change noted. Vascular: No hyperdense vessel or unexpected calcification. Skull: Intact.  No focal lesion. Sinuses/Orbits: Small mucous retention cyst or polyp right maxillary sinus and very short air-fluid level in the right maxillary sinus noted. Scattered ethmoid air cell disease. Other: Endotracheal tube noted. IMPRESSION: No acute intracranial abnormality. Atrophy and chronic microvascular ischemic change. Electronically Signed   By: Inge Rise M.D.   On: 02/17/2019 17:19   Dg Chest Port 1 View  Result Date: 02/21/2019 CLINICAL DATA:  Shortness of breath EXAM: PORTABLE CHEST 1 VIEW COMPARISON:  Portable exam 1037 hours compared to 02/20/2019 FINDINGS: LEFT subclavian pacemaker lead projects over RIGHT ventricle. Enlargement of cardiac silhouette post median sternotomy and MVR. Rotated to the RIGHT. Pulmonary vascular congestion. Probable minimal perihilar edema with atelectasis at RIGHT base. No pleural effusion or pneumothorax. IMPRESSION: Enlargement of cardiac silhouette with  vascular congestion and probable minimal pulmonary edema. RIGHT basilar atelectasis. Electronically Signed   By: Lavonia Dana M.D.   On: 02/21/2019 10:55   Dg Chest Port 1 View  Result Date: 02/20/2019 CLINICAL DATA:  Cough. EXAM: PORTABLE CHEST 1 VIEW COMPARISON:  Radiographs of Feb 18, 2019. FINDINGS: Stable cardiomegaly. Atherosclerosis of thoracic aorta is noted. Stable position of left-sided pacemaker. Endotracheal and nasogastric tubes have been removed. No pneumothorax is noted. Left lung is clear. Stable right lung atelectasis is noted. No significant pleural effusion is noted. Bony thorax is unremarkable. IMPRESSION: Endotracheal and nasogastric tubes have been removed. Stable right lung atelectasis. Aortic Atherosclerosis (ICD10-I70.0). Electronically Signed   By: Marijo Conception M.D.   On: 02/20/2019 14:35    Assessment and Plan:   1. Shortness of breath -Seems multifactorial.  Patient with significant valvular heart disease history as summarized above. -Right heart cath with mild to moderate pulmonary hypertension and severe TR last admission.  She was compliant with her diuretics.  Reports gradual worsening of dyspnea since discharge. -Chest x-ray today showed vascular congestion with minimal pulmonary edema. -On exam unable to her any crackles however patient had a diffuse wheezing. -She is on Lasix 80 mg twice daily.  ?  Pulmonary disease.    2.  Status post mechanical valve replacement -Recent echo 01/2019  showed normal functioning well.  3.  Persistent atrial fibrillation s/p pacemaker -Patient on chronic anticoagulation with Coumadin.  Currently on heparin bridge due to subtherapeutic INR.  4.  Severe tricuspid regurgitation -Felt to be related to pulmonary hypertension.  Did not felt candidate for PV clip due to presence of pacemaker wire.  5.  Chronic diastolic heart failure - Does not seems to have exacerbation   6. CVA likely 2nd to mechanical valve in setting of  subtherapeutic INR of 1.9 on admission. - s/p IR revascularization   For questions or updates, please contact [place] Please consult [email or web address] for contact info under    Jarrett Soho, PA  02/21/2019 2:13 PM   History and all data above reviewed.  Patient examined.  I agree with the findings as above.  She reports SOB not at baseline.  Denies pain.  She is weak.   The patient exam reveals QMV:[id number], mechanical S1  ,  Lungs: Decreased breath sounds and diffuse wheezing.   ,  Abd: Positive bowel sounds, no rebound no guarding, Ext No edema  .  All available labs, radiology testing, previous records reviewed. Agree with documented assessment and plan. SOB:  Agree multifactorial.  Discussed with primary team and will use bronchodilators.  Also I will switch to IV Lasix today.  Possibly back to PO in the AM.  MVR:  Should be as ASA plus warfarin (class I indication).  Also subtherapeutic on warfarin.  Will need to be followed closely post hospitalization.    Jeneen Rinks Buck Mcaffee  3:42 PM  02/21/2019

## 2019-02-22 ENCOUNTER — Inpatient Hospital Stay (HOSPITAL_COMMUNITY): Payer: Medicare Other

## 2019-02-22 DIAGNOSIS — S065X9A Traumatic subdural hemorrhage with loss of consciousness of unspecified duration, initial encounter: Secondary | ICD-10-CM

## 2019-02-22 DIAGNOSIS — I6601 Occlusion and stenosis of right middle cerebral artery: Secondary | ICD-10-CM

## 2019-02-22 DIAGNOSIS — T17910A Gastric contents in respiratory tract, part unspecified causing asphyxiation, initial encounter: Secondary | ICD-10-CM

## 2019-02-22 DIAGNOSIS — E46 Unspecified protein-calorie malnutrition: Secondary | ICD-10-CM | POA: Diagnosis present

## 2019-02-22 DIAGNOSIS — R791 Abnormal coagulation profile: Secondary | ICD-10-CM | POA: Diagnosis present

## 2019-02-22 DIAGNOSIS — I69391 Dysphagia following cerebral infarction: Secondary | ICD-10-CM

## 2019-02-22 DIAGNOSIS — R042 Hemoptysis: Secondary | ICD-10-CM

## 2019-02-22 DIAGNOSIS — I609 Nontraumatic subarachnoid hemorrhage, unspecified: Secondary | ICD-10-CM

## 2019-02-22 LAB — BASIC METABOLIC PANEL
Anion gap: 9 (ref 5–15)
BUN: 34 mg/dL — ABNORMAL HIGH (ref 8–23)
CO2: 22 mmol/L (ref 22–32)
Calcium: 9.1 mg/dL (ref 8.9–10.3)
Chloride: 102 mmol/L (ref 98–111)
Creatinine, Ser: 1.16 mg/dL — ABNORMAL HIGH (ref 0.44–1.00)
GFR calc Af Amer: 53 mL/min — ABNORMAL LOW (ref >60)
GFR calc non Af Amer: 45 mL/min — ABNORMAL LOW (ref >60)
Glucose, Bld: 101 mg/dL — ABNORMAL HIGH (ref 70–99)
Potassium: 4.7 mmol/L (ref 3.5–5.1)
Sodium: 133 mmol/L — ABNORMAL LOW (ref 135–145)

## 2019-02-22 LAB — PROTIME-INR
INR: 1.5 — ABNORMAL HIGH (ref 0.8–1.2)
Prothrombin Time: 18.1 seconds — ABNORMAL HIGH (ref 11.4–15.2)

## 2019-02-22 LAB — CBC
HCT: 25.6 % — ABNORMAL LOW (ref 36.0–46.0)
Hemoglobin: 8.5 g/dL — ABNORMAL LOW (ref 12.0–15.0)
MCH: 29.2 pg (ref 26.0–34.0)
MCHC: 33.2 g/dL (ref 30.0–36.0)
MCV: 88 fL (ref 80.0–100.0)
Platelets: 163 10*3/uL (ref 150–400)
RBC: 2.91 MIL/uL — ABNORMAL LOW (ref 3.87–5.11)
RDW: 24.7 % — ABNORMAL HIGH (ref 11.5–15.5)
WBC: 6.3 10*3/uL (ref 4.0–10.5)
nRBC: 0.3 % — ABNORMAL HIGH (ref 0.0–0.2)

## 2019-02-22 LAB — HEPARIN LEVEL (UNFRACTIONATED): Heparin Unfractionated: 0.37 IU/mL (ref 0.30–0.70)

## 2019-02-22 LAB — GLUCOSE, CAPILLARY: Glucose-Capillary: 121 mg/dL — ABNORMAL HIGH (ref 70–99)

## 2019-02-22 MED ORDER — ASPIRIN 81 MG PO TBEC: 81.0000 mg | DELAYED_RELEASE_TABLET | Freq: Every day | ORAL | Status: DC

## 2019-02-22 MED ORDER — NITROGLYCERIN 0.4 MG SL SUBL: 0.4000 mg | SUBLINGUAL_TABLET | SUBLINGUAL | 12 refills | Status: AC | PRN

## 2019-02-22 MED ORDER — PROTAMINE SULFATE 10 MG/ML IV SOLN
25.0000 mg | Freq: Once | INTRAVENOUS | Status: AC
  Administered 2019-02-22: 25 mg via INTRAVENOUS
  Filled 2019-02-22: qty 2.5

## 2019-02-22 MED ORDER — HEPARIN (PORCINE) 25000 UT/250ML-% IV SOLN: 1200.0000 [IU]/h | INTRAVENOUS | Status: DC

## 2019-02-22 MED ORDER — GUAIFENESIN 100 MG/5ML PO SOLN: 5.0000 mL | ORAL | 0 refills | Status: DC | PRN

## 2019-02-22 MED ORDER — FAMOTIDINE 40 MG/5ML PO SUSR: 20.0000 mg | Freq: Every day | ORAL | 0 refills | Status: DC

## 2019-02-22 MED ORDER — PANTOPRAZOLE SODIUM 40 MG IV SOLR
40.0000 mg | INTRAVENOUS | Status: DC
  Administered 2019-02-22 – 2019-02-24 (×3): 40 mg via INTRAVENOUS
  Filled 2019-02-22 (×3): qty 40

## 2019-02-22 MED ORDER — FUROSEMIDE 80 MG PO TABS
80.0000 mg | ORAL_TABLET | Freq: Two times a day (BID) | ORAL | Status: DC
  Administered 2019-02-22 – 2019-02-24 (×4): 80 mg via ORAL
  Filled 2019-02-22 (×4): qty 1

## 2019-02-22 MED ORDER — MORPHINE SULFATE (PF) 2 MG/ML IV SOLN: 0.5000 mg | Freq: Four times a day (QID) | INTRAVENOUS | Status: DC | PRN

## 2019-02-22 MED ORDER — METOPROLOL TARTRATE 25 MG PO TABS: 25.0000 mg | ORAL_TABLET | Freq: Two times a day (BID) | ORAL | Status: DC

## 2019-02-22 MED ORDER — WARFARIN SODIUM 5 MG PO TABS: ORAL_TABLET | ORAL | 1 refills | Status: DC

## 2019-02-22 MED ORDER — FUROSEMIDE 10 MG/ML IJ SOLN: 80.0000 mg | Freq: Two times a day (BID) | INTRAMUSCULAR | 0 refills | Status: DC

## 2019-02-22 MED ORDER — ALBUTEROL SULFATE (2.5 MG/3ML) 0.083% IN NEBU: 2.5000 mg | INHALATION_SOLUTION | Freq: Four times a day (QID) | RESPIRATORY_TRACT | 12 refills | Status: AC | PRN

## 2019-02-22 MED ORDER — ONDANSETRON HCL 4 MG/2ML IJ SOLN: 4.0000 mg | Freq: Four times a day (QID) | INTRAMUSCULAR | 0 refills | Status: DC | PRN

## 2019-02-22 MED ORDER — GUAIFENESIN 100 MG/5ML PO SOLN
5.0000 mL | Freq: Four times a day (QID) | ORAL | Status: DC
  Administered 2019-02-22 – 2019-02-24 (×7): 100 mg via ORAL
  Filled 2019-02-22 (×7): qty 5

## 2019-02-22 MED ORDER — SENNOSIDES-DOCUSATE SODIUM 8.6-50 MG PO TABS: 1.0000 | ORAL_TABLET | Freq: Every evening | ORAL | Status: DC | PRN

## 2019-02-22 MED ORDER — SPIRONOLACTONE 25 MG PO TABS: 12.5000 mg | ORAL_TABLET | Freq: Every day | ORAL | Status: DC

## 2019-02-22 MED ORDER — BISACODYL 10 MG RE SUPP: 10.0000 mg | Freq: Every day | RECTAL | 0 refills | Status: DC | PRN

## 2019-02-22 MED ORDER — HYDRALAZINE HCL 20 MG/ML IJ SOLN: 5.0000 mg | INTRAMUSCULAR | Status: DC | PRN

## 2019-02-22 MED ORDER — ATORVASTATIN CALCIUM 40 MG PO TABS: 40.0000 mg | ORAL_TABLET | Freq: Every day | ORAL | Status: DC

## 2019-02-22 MED ORDER — DOCUSATE SODIUM 50 MG/5ML PO LIQD: 100.0000 mg | Freq: Two times a day (BID) | ORAL | 0 refills | Status: DC | PRN

## 2019-02-22 MED ORDER — ENSURE ENLIVE PO LIQD: 237.0000 mL | Freq: Two times a day (BID) | ORAL | 12 refills | Status: DC

## 2019-02-22 NOTE — Progress Notes (Signed)
Neurologist on called text paged that patient had bloody sputum vital signs charted in Epic and reported.Arthor Captain LPN

## 2019-02-22 NOTE — Progress Notes (Signed)
Patient with emesis X 2. Zofran given with not yet enough time to assess efficacy. Small amount clotted blood in emesis. Continues leak blood from previous IV site to R forearm. Dressing changed. MD notified. Wendee Copp

## 2019-02-22 NOTE — Progress Notes (Signed)
NAME:  Carrie [name], MRN:  [id number], DOB:  04-28-1941, LOS: 5 ADMISSION DATE:  02/17/2019, CONSULTATION DATE:  02/17/2019 REFERRING MD:  Dr. Estanislado Pandy, CHIEF COMPLAINT:  AMS   Brief History   [name] who presented to [facility] 5/29 with left sided weakness, facial droop and slurred speech.  Work up consistent with large vessel occlusion and patient taken emergently to neuro IR for revascularization.  Past Medical History  SSS s/p Pacemker, Severe TR, Rheumatic Mitral Valve Disease , HTN, HLD, NSTEMI , CHF - chronic combined, CAD, AF - on anticoagulation, SDH   Significant Hospital Events   5/29-right M1 occlusion-->thrombectomy 5/31 extubated, tx to floor 6/3 worse cough and developed vomiting. SDH on CT head. Respiratory distress. PCCM consulted.   Consults:  PCCM  Procedures:  5/29 ETT   Significant Diagnostic Tests:  5/15 Echo > LVEF 60-65%. Severe biatrial enlargement, moderate to severe TR 5/18 RHC > She had mild-moderate PAH but main issues is severe TR. She is not candidate for surgical TVR. Could consider experimental TV clip but currently not enrolling patient with pacemaker wires. Continue medical therapy 5/29 CT Head CODE Stroke >> no acute abnormality  5/29 CTA Head / Neck >> acute occlusion of the right M1 segment, delayed collateral flow to the R M2 branches.  Marked dilatation of the jugular veins bilaterally likely due to elevated right heart pressures.  6/3 CT head > Unusual acute posterior fossa hemorrhage. In the right posterior fossa subdural location is favored and in the prepontine cistern clot appears subarachnoid. There is cerebellar mass effect with patent fourth ventricle.   Micro Data:  COVID negative 5/29   Antimicrobials:    Interim history/subjective:  Had been recovering well until 6/2 PM when she had an episode of blood tinged sputum. Then 6/3 AM she developed vomiting x 2. Repeat CT of the head was done, which demonstrated unusual posterior fossa  subdural hemorrhage. Worsening respiratory distress.   Objective   Blood pressure (!) 114/54, pulse 96, temperature 97.9 F (36.6 C), resp. rate (!) 22, height 5\' 1"  (1.549 m), weight 59 kg, SpO2 100 %.        Intake/Output Summary (Last 24 hours) at 02/22/2019 1301 Last data filed at 02/22/2019 1100 Gross per 24 hour  Intake 300 ml  Output 1925 ml  Net -1625 ml   Filed Weights   02/17/19 1100  Weight: 59 kg    Examination: General: elderly female in NAD HENT: NA/AT, PERRL, moderate to severe JVD  Lungs: Upper airway secretions not cleared with cough. Lung sounds clear.  Cardiovascular: RRR, no MRG Abdomen: Soft, non-tender, non-distended Extremities: no edema, no acute deformity Neuro: alert, oriented, answers questions appropriately.   Resolved Hospital Problem list   Acute Respiratory Insufficiency requiring Mechanical Ventilation (perioperative)  Assessment & Plan:   Right M1 Occlusion / CVA - s/p revascularization of right common carotid  Subdural hematoma in the posterior fossa with mass effect (felt to be secondary to elevated venous pressures with new onset cough and vomiting.) - Management per neurology - Heparin reversed with protamine - Did not reverse coumadin as INR was 1.5  Hypoxia has been on 3L since extubation and remains on 3L, but now concern for below Possible aspiration pneumonia: in the setting of vomiting and questionable airway protection.  - Continue supplemental O2 to keep SpO2 > 90% - Defer empiric antibiotics for now, low threshold to start - CXR now - will defer sputum culture as cough felt to contribute  to SDH  Hemoptysis: no clear etiology. She has been receiving heparin and warfarin. INR has been subtherapeutic.  - morphine q 6 hours PRN for cough suppression. Caution with listed intolerance to codeine (nausea and vomiting) - Follow CXR - Heparin has been reversed and warfarin/ASA held.   Chronic diastolic CHF  SSS s/p Pacemaker  Hx  Bioprosthetic MV AF - on coumadin P: Cardiology following Increased diuresis 6/2 ASA + warfarin recommended prior to SDH  Best practice:  Diet: Per primary Pain/Anxiety/Delirium protocol (if indicated): n/a VAP protocol (if indicated):N/a DVT prophylaxis: SCDs GI prophylaxis: PPI, HOB >30 Glucose control: monitor Mobility: bed Code Status: full Family Communication: per priamry Disposition: SDU  Labs   CBC: Recent Labs  Lab 02/17/19 1132  02/18/19 0314 02/18/19 1904 02/19/19 0358 02/20/19 0730 02/21/19 0400 02/22/19 0319  WBC 3.7*  --  7.5  --  7.6 6.9 6.6 6.3  NEUTROABS 2.3  --  5.4  --   --   --   --   --   HGB 9.4*   < > 8.8* 8.8* 9.1* 8.6* 8.7* 8.5*  HCT 29.9*   < > 25.9* 26.0* 26.7* 26.1* 26.6* 25.6*  MCV 93.4  --  85.5  --  85.6 89.1 88.4 88.0  PLT 143*  --  175  --  170 162 161 163   < > = values in this interval not displayed.    Basic Metabolic Panel: Recent Labs  Lab 02/17/19 1500  02/19/19 0358 02/20/19 0730 02/21/19 0400 02/21/19 0952 02/22/19 0319  NA  --    < > 141 137 136 135 133*  K  --    < > 2.8* 3.1* 5.2* 4.4 4.7  CL  --    < > 109 106 107 105 102  CO2  --    < > 19* 22 20* 22 22  GLUCOSE  --    < > 113* 92 102* 120* 101*  BUN  --    < > 25* 24* 31* 31* 34*  CREATININE  --    < > 1.31* 1.13* 1.28* 1.23* 1.16*  CALCIUM  --    < > 9.1 9.0 8.8* 9.1 9.1  MG 2.4  --   --  1.8  --   --   --   PHOS  --   --   --   --   --  2.2*  --    < > = values in this interval not displayed.   GFR: Estimated Creatinine Clearance: 33.5 mL/min (A) (by C-G formula based on SCr of 1.16 mg/dL (H)). Recent Labs  Lab 02/19/19 0358 02/20/19 0730 02/21/19 0400 02/22/19 0319  WBC 7.6 6.9 6.6 6.3    Liver Function Tests: Recent Labs  Lab 02/17/19 1132 02/21/19 0952  AST 98*  --   ALT 23  --   ALKPHOS 129*  --   BILITOT 1.2  --   PROT 8.0  --   ALBUMIN 3.3* 2.8*   No results for input(s): LIPASE, AMYLASE in the last 168 hours. No results for  input(s): AMMONIA in the last 168 hours.  ABG    Component Value Date/Time   PHART 7.553 (H) 02/17/2019 1544   PCO2ART 23.0 (L) 02/17/2019 1544   PO2ART 130.0 (H) 02/17/2019 1544   HCO3 20.7 02/17/2019 1544   TCO2 21 (L) 02/17/2019 1544   ACIDBASEDEF 1.0 02/17/2019 1544   O2SAT 99.0 02/17/2019 1544     Coagulation Profile: Recent Labs  Lab 02/17/19 1132 02/20/19 0730 02/21/19 0400 02/22/19 0319  INR 1.9* 1.7* 1.8* 1.5*    Cardiac Enzymes: No results for input(s): CKTOTAL, CKMB, CKMBINDEX, TROPONINI in the last 168 hours.  HbA1C: Hgb A1c MFr Bld  Date/Time Value Ref Range Status  02/18/2019 03:14 AM 5.2 4.8 - 5.6 % Final    Comment:    (NOTE) Pre diabetes:          5.7%-6.4% Diabetes:              >6.4% Glycemic control for   <7.0% adults with diabetes   03/31/2018 01:12 AM 4.7 (L) 4.8 - 5.6 % Final    Comment:    (NOTE) Pre diabetes:          5.7%-6.4% Diabetes:              >6.4% Glycemic control for   <7.0% adults with diabetes     CBG: Recent Labs  Lab 02/18/19 1955 02/18/19 2310 02/19/19 0317 02/19/19 0747 02/19/19 [place], AGACNP-BC [place] Pager [phone] or [phone]  02/22/2019 1:27 PM

## 2019-02-22 NOTE — Progress Notes (Signed)
Progress Note  Patient Name: Carrie [name] Date of Encounter: 02/22/2019  Primary Cardiologist:   [name], MD   Subjective   She is somnolent but seems to denies SOB or pain.    Inpatient Medications    Scheduled Meds: . atorvastatin  40 mg Oral q1800  . Chlorhexidine Gluconate Cloth  6 each Topical Daily  . furosemide  80 mg Intravenous BID  . guaiFENesin  5 mL Oral Q6H  . metoprolol tartrate  25 mg Oral BID  . pantoprazole (PROTONIX) IV  40 mg Intravenous Q24H  . spironolactone  12.5 mg Oral Daily   Continuous Infusions:  PRN Meds: acetaminophen **OR** [DISCONTINUED] acetaminophen (TYLENOL) oral liquid 160 mg/5 mL **OR** acetaminophen, albuterol, bisacodyl, docusate, hydrALAZINE, morphine injection, nitroGLYCERIN, ondansetron (ZOFRAN) IV, polyvinyl alcohol, senna-docusate   Vital Signs    Vitals:   02/22/19 0348 02/22/19 0557 02/22/19 0836 02/22/19 1256  BP: 93/63  (!) 114/54   Pulse: 69  96   Resp:   (!) 22   Temp: 98.1 F (36.7 C)  98.2 F (36.8 C) 97.9 F (36.6 C)  TempSrc: Oral  Oral   SpO2: 99% 99% 100%   Weight:      Height:        Intake/Output Summary (Last 24 hours) at 02/22/2019 1336 Last data filed at 02/22/2019 1100 Gross per 24 hour  Intake 300 ml  Output 1925 ml  Net -1625 ml   Filed Weights   02/17/19 1100  Weight: 59 kg    Telemetry    Atrial fib with moderately elevated atrial rate - Personally Reviewed  ECG    NA - Personally Reviewed  Physical Exam   GEN: No acute distress.   Neck: No  JVD Cardiac: Irrebular RR, mechanical S1.   Respiratory:    Few scattered wheezes, improved air movement, few crackles GI: Soft, nontender, non-distended  MS: No  edema; No deformity. Neuro:  Nonfocal  Psych: Normal affect   Labs    Chemistry Recent Labs  Lab 02/17/19 1132  02/21/19 0400 02/21/19 0952 02/22/19 0319  NA 137   < > 136 135 133*  K 4.9   < > 5.2* 4.4 4.7  CL 107   < > 107 105 102  CO2 20*   < > 20* 22 22   GLUCOSE 94   < > 102* 120* 101*  BUN 41*   < > 31* 31* 34*  CREATININE 1.38*   < > 1.28* 1.23* 1.16*  CALCIUM 9.6   < > 8.8* 9.1 9.1  PROT 8.0  --   --   --   --   ALBUMIN 3.3*  --   --  2.8*  --   AST 98*  --   --   --   --   ALT 23  --   --   --   --   ALKPHOS 129*  --   --   --   --   BILITOT 1.2  --   --   --   --   GFRNONAA 37*   < > 40* 42* 45*  GFRAA 43*   < > 47* 49* 53*  ANIONGAP 10   < > 9 8 9    < > = values in this interval not displayed.     Hematology Recent Labs  Lab 02/20/19 0730 02/21/19 0400 02/22/19 0319  WBC 6.9 6.6 6.3  RBC 2.93* 3.01* 2.91*  HGB 8.6* 8.7* 8.5*  HCT  26.1* 26.6* 25.6*  MCV 89.1 88.4 88.0  MCH 29.4 28.9 29.2  MCHC 33.0 32.7 33.2  RDW 25.4* 25.2* 24.7*  PLT 162 161 163    Cardiac EnzymesNo results for input(s): TROPONINI in the last 168 hours. No results for input(s): TROPIPOC in the last 168 hours.   BNPNo results for input(s): BNP, PROBNP in the last 168 hours.   DDimer No results for input(s): DDIMER in the last 168 hours.   Radiology    Dg Chest 2 View  Result Date: 02/22/2019 CLINICAL DATA:  Hemoptysis EXAM: CHEST - 2 VIEW COMPARISON:  Yesterday FINDINGS: Cardiopericardial enlargement and vascular pedicle widening. There has been mitral valve replacement. Vascular congestion and right pleural effusion with presumed atelectasis. No pneumothorax. Single chamber pacer into the right ventricle. IMPRESSION: Stable cardiomegaly and vascular congestion with small right pleural effusion. Electronically Signed   By: Monte Fantasia M.D.   On: 02/22/2019 05:29   Ct Head Wo Contrast  Result Date: 02/22/2019 CLINICAL DATA:  Stroke follow-up EXAM: CT HEAD WITHOUT CONTRAST TECHNIQUE: Contiguous axial images were obtained from the base of the skull through the vertex without intravenous contrast. COMPARISON:  02/18/2019 FINDINGS: Brain: Interval posterior fossa hemorrhage with hematocrit level along the lateral right cerebellum measuring up to 14  mm in thickness. This collection is favored subdural in location, but there is also hemorrhage in the prepontine cistern that appears to be more subarachnoid. There is mass effect on the right cerebellum with fourth ventricular distortion but no hydrocephalus. Chronic small vessel ischemic injury to the supratentorial white matter and caudate nuclei. Vascular: No hyperdense vessel. Skull: Negative Sinuses/Orbits: Bilateral cataract resection. 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 Jugular veins are dilated on prior CTA. There is history of valvular cardiac disease which is the presumed cause. Other: Critical Value/emergent results were called by telephone at the time of interpretation on 02/22/2019 at 11:42 am to Dr. Rosalin Hawking , who verbally acknowledged these results. IMPRESSION: Unusual acute posterior fossa hemorrhage. In the right posterior fossa subdural location is favored and in the prepontine cistern clot appears subarachnoid. There is cerebellar mass effect with patent fourth ventricle. Electronically Signed   By: Monte Fantasia M.D.   On: 02/22/2019 11:50   Dg Chest Port 1 View  Result Date: 02/21/2019 CLINICAL DATA:  Shortness of breath EXAM: PORTABLE CHEST 1 VIEW COMPARISON:  Portable exam 1037 hours compared to 02/20/2019 FINDINGS: LEFT subclavian pacemaker lead projects over RIGHT ventricle. Enlargement of cardiac silhouette post median sternotomy and MVR. Rotated to the RIGHT. Pulmonary vascular congestion. Probable minimal perihilar edema with atelectasis at RIGHT base. No pleural effusion or pneumothorax. IMPRESSION: Enlargement of cardiac silhouette with vascular congestion and probable minimal pulmonary edema. RIGHT basilar atelectasis. Electronically Signed   By: Lavonia Dana M.D.   On: 02/21/2019 10:55   Dg Chest Port 1 View  Result Date: 02/20/2019 CLINICAL DATA:  Cough. EXAM: PORTABLE CHEST 1 VIEW COMPARISON:  Radiographs of Feb 18, 2019.  FINDINGS: Stable cardiomegaly. Atherosclerosis of thoracic aorta is noted. Stable position of left-sided pacemaker. Endotracheal and nasogastric tubes have been removed. No pneumothorax is noted. Left lung is clear. Stable right lung atelectasis is noted. No significant pleural effusion is noted. Bony thorax is unremarkable. IMPRESSION: Endotracheal and nasogastric tubes have been removed. Stable right lung atelectasis. Aortic Atherosclerosis (ICD10-I70.0). Electronically Signed   By: Marijo Conception M.D.   On: 02/20/2019 14:35    Cardiac Studies   ECHO:  1. The left ventricle has normal systolic function with an ejection fraction of 60-65%. The cavity size was normal. There is moderately increased left ventricular wall thickness. Left ventricular diastolic function could not be evaluated due to mitral  valve replacement/repair. There is abnormal septal motion consistent with RV pacemaker and right ventricular volume and pressure overload.  2. The right ventricle has moderately reduced systolic function. The cavity was moderately enlarged. There is no increase in right ventricular wall thickness.  3. Left atrial size was severely dilated.  4. Right atrial size was severely dilated.  5. A MVR mechanical valve is present in the mitral position.  6. The tricuspid valve is grossly normal. Tricuspid valve regurgitation is moderate-severe.  7. The aortic valve is tricuspid. Mild calcification of the aortic valve. Aortic valve regurgitation is mild by color flow Doppler. Mild stenosis of the aortic valve.  8. The inferior vena cava was dilated in size with <50% respiratory variability.  9. The interatrial septum was not well visualized. 10. When compared to the prior study: 03/31/2018: LVEF 60-65%, RVSP 66 mmHg.  Patient Profile     78 y.o. 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 Erlinda Hong.   Assessment & Plan    SOB:  Much improved.  Change back to PO Lasix today.  Good urine output.    MVR:  Continue ASA 81 mg and Warfarin.   ATRIAL FIB:  Rate is OK for the given clinical scenario.   Continue current therapy.     For questions or updates, please contact [place] Please consult [email or web address] for contact info under Cardiology/STEMI.   Signed, Minus Breeding, MD  02/22/2019, 1:36 PM

## 2019-02-22 NOTE — Progress Notes (Signed)
STROKE TEAM PROGRESS NOTE   SUBJECTIVE (INTERVAL HISTORY) Pt developed headache, with nausea vomiting x3.  Received Zofran.  Heparin on hold.  Had a CT stat showed right posterior fossa subdural hematoma with paramedian SAH.  Heparin discontinued, reversed with protamine.  Coumadin discontinued, INR this morning 1.5, no reversal needed.  CCM consulted, patient respiratory stabilized, no need transfer to ICU.  Patient gradually getting better, no more nausea vomiting, repeat CT in the evening showed stable/improved subdural hematoma and SAH.  OBJECTIVE Vitals:   02/21/19 2330 02/22/19 0348 02/22/19 0557 02/22/19 0836  BP:  93/63  (!) 114/54  Pulse:  69  96  Resp:    (!) 22  Temp:  98.1 F (36.7 C)  98.2 F (36.8 C)  TempSrc:  Oral  Oral  SpO2: 97% 99% 99% 100%  Weight:      Height:        CBC:  Recent Labs  Lab 02/17/19 1132  02/18/19 0314  02/21/19 0400 02/22/19 0319  WBC 3.7*  --  7.5   < > 6.6 6.3  NEUTROABS 2.3  --  5.4  --   --   --   HGB 9.4*   < > 8.8*   < > 8.7* 8.5*  HCT 29.9*   < > 25.9*   < > 26.6* 25.6*  MCV 93.4  --  85.5   < > 88.4 88.0  PLT 143*  --  175   < > 161 163   < > = values in this interval not displayed.    Basic Metabolic Panel:  Recent Labs  Lab 02/17/19 1500  02/20/19 0730  02/21/19 0952 02/22/19 0319  NA  --    < > 137   < > 135 133*  K  --    < > 3.1*   < > 4.4 4.7  CL  --    < > 106   < > 105 102  CO2  --    < > 22   < > 22 22  GLUCOSE  --    < > 92   < > 120* 101*  BUN  --    < > 24*   < > 31* 34*  CREATININE  --    < > 1.13*   < > 1.23* 1.16*  CALCIUM  --    < > 9.0   < > 9.1 9.1  MG 2.4  --  1.8  --   --   --   PHOS  --   --   --   --  2.2*  --    < > = values in this interval not displayed.    Lipid Panel:     Component Value Date/Time   CHOL 81 02/18/2019 0314   TRIG 34 02/19/2019 0358   HDL 28 (L) 02/18/2019 0314   CHOLHDL 2.9 02/18/2019 0314   VLDL 12 02/18/2019 0314   LDLCALC 41 02/18/2019 0314   HgbA1c:  Lab  Results  Component Value Date   HGBA1C 5.2 02/18/2019   Urine Drug Screen:     Component Value Date/Time   LABOPIA NONE DETECTED 02/17/2019 1500   COCAINSCRNUR NONE DETECTED 02/17/2019 1500   LABBENZ NONE DETECTED 02/17/2019 1500   AMPHETMU NONE DETECTED 02/17/2019 1500   THCU NONE DETECTED 02/17/2019 1500   LABBARB NONE DETECTED 02/17/2019 1500    Alcohol Level     Component Value Date/Time   ETH <10 02/17/2019 1132  IMAGING Ct Head Code Stroke Wo Contrast 02/17/2019 IMPRESSION:  1. No acute intracranial abnormality  2. ASPECTS is 10   Ct Angio Head W Or Wo Contrast Ct Angio Neck W Or Wo Contrast 02/17/2019 IMPRESSION:  1. Occlusion right M1 segment which appears acute. Delayed collateral flow to right M2 branches.  2. No significant carotid or vertebral artery stenosis in the neck.  3. Marked enlargement of the superior ophthalmic vein bilaterally. No evidence of carotid cavernous fistula. Marked dilatation of the jugular veins bilaterally is chronic and likely due to elevated right heart pressures. The patient has history of cardiac valvular disease.   Ct Head Wo Contrast 02/17/2019 IMPRESSION:  No acute intracranial abnormality. Atrophy and chronic microvascular ischemic change.   Repeat CT Head WO Contrast 02/18/2019 No acute intracranial abnormality by noncontrast CT. Stable atrophy and chronic white matter microvascular changes.   Ct Head Wo Contrast 02/22/2019 1132 IMPRESSION:  Unusual acute posterior fossa hemorrhage. In the right posterior fossa subdural location is favored and in the prepontine cistern clot appears subarachnoid. There is cerebellar mass effect with patent fourth ventricle.  Ct Head Wo Contrast 02/22/2019 1800 IMPRESSION:  Stable to slightly improved RIGHT posterior fossa extra-axial hematoma, favored to be subdural. Continued surveillance is warranted.  Transthoracic Echocardiogram  02/03/2019 IMPRESSIONS  1. The left ventricle has  normal systolic function with an ejection fraction of 60-65%. The cavity size was normal. There is moderately increased left ventricular wall thickness. Left ventricular diastolic function could not be evaluated due to mitral valve replacement/repair. There is abnormal septal motion consistent with RV pacemaker and right ventricular volume and pressure overload.  2. The right ventricle has moderately reduced systolic function. The cavity was moderately enlarged. There is no increase in right ventricular wall thickness.  3. Left atrial size was severely dilated.  4. Right atrial size was severely dilated.  5. A MVR mechanical valve is present in the mitral position.  6. The tricuspid valve is grossly normal. Tricuspid valve regurgitation is moderate-severe.  7. The aortic valve is tricuspid. Mild calcification of the aortic valve. Aortic valve regurgitation is mild by color flow Doppler. Mild stenosis of the aortic valve.  8. The inferior vena cava was dilated in size with <50% respiratory variability.  9. The interatrial septum was not well visualized. 10. When compared to the prior study: 03/31/2018: LVEF 60-65%, RVSP 66 mmHg.  EKG - atrial fibrillation - ventricular response 68 BPM (See cardiology reading for complete details)   PHYSICAL EXAM  Temp:  [98.1 F (36.7 C)-99.1 F (37.3 C)] 98.2 F (36.8 C) (06/03 0836) Pulse Rate:  [57-96] 96 (06/03 0836) Resp:  [16-22] 22 (06/03 0836) BP: (93-115)/(52-66) 114/54 (06/03 0836) SpO2:  [95 %-100 %] 100 % (06/03 0836)  General - Well nourished, well developed, intermittent coughing with gag.  Ophthalmologic - fundi not visualized due to noncooperation.  Cardiovascular - irregularly irregular heart rate and rhythm.  Neuro - lethargic, but awake alert orientated to time place and people.  Language fluent, following simple commands, intact naming and repetition. PERRL, EOMI, no gaze preference, visual field full, tracking bilaterally. Facial  symmetry and tongue midline on protrusion. BUE 4/5 and BLE 3+/5. DTR 1+ and no babinski. Sensation symmetrical, coordination seems to have Left FTN mild dysmetria and gait not tested.   ASSESSMENT/PLAN [name] is a 78 y.o. female with history of a-fib, mechanincal heart valve, warfarin therapy, pacemaker, prior SDH, CAD, CHF, HLD, HTN, NSTEMI in 2017 and prior stroke  presenting after she collapsed with right sided gaze preference, left facial droop and left hemiplegia. She did not receive IV t-PA due to warfarin anticoagulation with INR 1.9.  Stroke: suspected Rt MCA infarct with right M1 cut off s/p IR with TICI3 reperfusion, likely due to mechanical valve and A. fib with subtherapeutic INR. Developed R posterior fossa SDH and prepontine SAH  Code Stroke CT head - No acute intracranial abnormality. APECTS 10   MRI head - PPM not compatible with MRI  CTA H&N - Occlusion right M1 segment which appears acute.   IR with TICI3 reperfusion  Repeat Head CT 5/29 -no acute intracranial abnormality  Repeat Head CT 5/30 -no acute intracranial abnormality  Patient with vomiting 6/3  Head CT 6/3 1132 -unusual acute posterior fossa SDH and perimesencephalic SAH. Cerebellar mass effect on 4th ventricle  INR 1.9 -> 1.7->1.8->1.5  Protamine given  Repeat Heat CT 6/3 1800 showed stable/improved SDH/SAH  2D Echo - EF 60 - 65%. No cardiac source of emboli identified.   Sars Corona Virus 2  - negative  LDL - 41  HgbA1c - 5.2  UDS  - negative  VTE prophylaxis - IV hep->warfarin  warfarin daily prior to admission, treated with Heparin IV and Coumadin given mechanical valve and atrial fibrillation.  INR goal changed to 2.5-3.0 after discussion with Dr. Percival Spanish. Also per Dr. Percival Spanish, added aspirin to warfarin given class I indication.->heparin, warfarin and asa now stopped given hmg   Therapy recommendations:  CIR   Disposition:  Pending  SDH and SAH  Headache with nausea  vomiting  CT stat showed right posterior fossa SDH with perimesencephalic SAH  Heparin reversed with protamine  Coumadin and aspirin discontinued  INR 1.5 this morning  Repeat CT showed stable/improved SDH and SAH  Continue monitoring  Etiology likely due to coughing in the setting of severe increased venous pressure given significantly engorged IJ on CTA neck and history of pulmonary hypertension  Continue guanfacine every 6 hours scheduled  N.p.o. except meds for now  Mechanical mitral valve  Followed by cardiology  On Coumadin PTA  INR 1.9 on admission  INR goal changed to 2.0-2.5 due to history of SDH and liver hemorrhage (normal condition, INR goal 2.5-3.5)  Stop heparin IV due to SDH and SAH  Stop ASA 81 due to SDH and SAH  INR 1.9->1.7->1.8->1.5  Protamine given 6/3  Chronic A. 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  Also found to have small SAH suspected posttraumatic versus septic emboli, which subsequently resolved, put on heparin drip bridged to Coumadin.   Respiratory failure, resolved  Was intubated for procedure  Extubated 02/19/19  CXR yesterday w/ vascular congestion and prob minimal pulm edema, R basilar atx  Repeat CXR today stable vascular congestion, small R pericardial effusion  On bronchdilators  Cardiology on board, changed diuretics yesterday to IV, likely back to po today, card will follow in rehab.  Hypotension History of hypertension  BP stable  off neo  On Lasix and spironolactone  BP goal normotensive  Hyperlipidemia  Lipid lowering medication PTA:  Lipitor 40 mg daily  LDL 41, goal < 70  DC Lipitor for now due to  SDH/SAH  Continue statin at discharge  Anemia  Hemoglobin 9.4-10.5-8.8-9.1-8.6-8.7-8.5  Treated with IV heparin and coumadin , now off d/t SAH/SDH  close CBC monitoring  PRBC transfusion if Hb < 7  Dysphagia   Extubated 5/31  Speech following  Has on diet, currently n.p.o. due to vomiting and concern of aspiration  Other Stroke Risk Factors  Advanced age  Family hx stroke (father and brother)  Coronary artery disease, non-STEMI 2017  Migraines  CHF  Other Active Problems  CKD stage III creatinine - 1.38->1.40->1.29->1.31->1.13->1.23>1.16  Pacemaker  History of SDH  Hypokalemia - 3.5->2.8->3.1->5.2-4.4-4.7  Intermittent coughing - monitoring - CXR neg. Given new SAH/SDH - CCM consulted  Hospital day # 5  I spent  35 minutes in total face-to-face time with the patient, more than 50% of which was spent in counseling and coordination of care, reviewing test results, images and medication, and discussing the diagnosis of stroke status post thrombectomy, A. fib, mechanical valve, posterior fossa SDH, perimesencephalic SAH, dysphasia and anemia treatment plan and potential prognosis. This patient's care requiresreview of multiple databases, neurological assessment, discussion with family, other specialists and medical decision making of high complexity. Discussed with Dr. Elsworth Soho and CCM team.   Rosalin Hawking, MD PhD Stroke Neurology 02/22/2019 12:08 PM  To contact Stroke Continuity provider, please refer to [email or web address] After hours, contact General Neurology

## 2019-02-22 NOTE — Progress Notes (Signed)
Inpatient Rehab Admissions Coordinator:   Discussed with RN and note pt with change in status.  Will continue to follow workup to determine whether pt ready to d/c to CIR today or tomorrow.    Shann Medal, PT, DPT Admissions Coordinator [phone] 02/22/19  11:37 AM

## 2019-02-22 NOTE — Progress Notes (Signed)
Plan is for CIR when pt is medically ready. TOC following.

## 2019-02-22 NOTE — Progress Notes (Signed)
OT Cancellation Note  Patient Details Name: Carrie [name] MRN: [id number] DOB: Jan 31, 1941   Cancelled Treatment:    Reason Eval/Treat Not Completed: Medical issues which prohibited therapy, pt with decline in status; repeat imaging revealing subdural hematoma.  RN requesting OT to hold today.  Will follow and see as medically appropriate and able.   Delight Stare, OT Acute Rehabilitation Services Pager [phone] Office [phone]   Delight Stare 02/22/2019, 1:53 PM

## 2019-02-22 NOTE — Progress Notes (Signed)
Physical Therapy Treatment Patient Details Name: Carrie [name] MRN: [id number] DOB: 05-09-1941 Today's Date: 02/22/2019    History of Present Illness [name] who presented to [facility] 5/29 with left sided weakness, facial droop and slurred speech. 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 . The patient was taken to interventional radiology per Dr. 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 The patient was returned to ICU on mechanical ventilation, extubated 5/31. 6/3 CT revealed new acute posterior fossa hemorrhage.     PT Comments    Noted status change and new findings on CT. Pt was repositioned in bed and HOB elevated due to coughing up blood and vomiting. Will re-evaluate pt at next available time and continue to assess for most appropriate d/c disposition.    Follow Up Recommendations  CIR;Supervision/Assistance - 24 hour     Equipment Recommendations  None recommended by PT    Recommendations for Other Services Rehab consult     Precautions / Restrictions Precautions Precautions: Fall Restrictions Weight Bearing Restrictions: No    Mobility  Bed Mobility               General bed mobility comments: +2 assist required for repositioning in the bed.   Transfers                    Ambulation/Gait                 Stairs             Wheelchair Mobility    Modified Rankin (Stroke Patients Only)       Balance                                            Cognition Arousal/Alertness: Lethargic Behavior During Therapy: Flat affect                                          Exercises      General Comments        Pertinent Vitals/Pain Pain Assessment: No/denies pain    Home Living                      Prior Function             PT Goals (current goals can now be found in the care plan section) Acute Rehab PT Goals PT Goal Formulation: With patient Time For Goal Achievement: 03/05/19 Potential to Achieve Goals: Good Progress towards PT goals: Progressing toward goals    Frequency    Min 4X/week      PT Plan Current plan remains appropriate    Co-evaluation              AM-PAC PT "6 Clicks" Mobility   Outcome Measure  Help needed turning from your back to your side while in a flat bed without using bedrails?: A Little Help needed moving from lying on your back to sitting on the side of a flat bed without using bedrails?: A Little Help needed moving to and from a bed to a chair (including a wheelchair)?: A  Little Help needed standing up from a chair using your arms (e.g., wheelchair or bedside chair)?: A Little Help needed to walk in hospital room?: A Little Help needed climbing 3-5 steps with a railing? : A Lot 6 Click Score: 17    End of Session Equipment Utilized During Treatment: Gait belt Activity Tolerance: Patient tolerated treatment well Patient left: in bed;with call bell/phone within reach;with bed alarm set Nurse Communication: Mobility status PT Visit Diagnosis: Unsteadiness on feet (R26.81);Other abnormalities of gait and mobility (R26.89);Muscle weakness (generalized) (M62.81)     Time: 5830-9407 PT Time Calculation (min) (ACUTE ONLY): 16 min  Charges:  $Therapeutic Activity: 8-22 mins                     Carrie [name], PT, DPT Acute Rehabilitation Services Pager: [phone] Office: [phone]    Carrie [name] 02/22/2019, 12:12 PM

## 2019-02-23 ENCOUNTER — Inpatient Hospital Stay (HOSPITAL_COMMUNITY): Payer: Medicare Other

## 2019-02-23 DIAGNOSIS — I272 Pulmonary hypertension, unspecified: Secondary | ICD-10-CM

## 2019-02-23 DIAGNOSIS — I5043 Acute on chronic combined systolic (congestive) and diastolic (congestive) heart failure: Secondary | ICD-10-CM

## 2019-02-23 LAB — BASIC METABOLIC PANEL
Anion gap: 10 (ref 5–15)
BUN: 32 mg/dL — ABNORMAL HIGH (ref 8–23)
CO2: 24 mmol/L (ref 22–32)
Calcium: 9.4 mg/dL (ref 8.9–10.3)
Chloride: 101 mmol/L (ref 98–111)
Creatinine, Ser: 1.21 mg/dL — ABNORMAL HIGH (ref 0.44–1.00)
GFR calc Af Amer: 50 mL/min — ABNORMAL LOW (ref >60)
GFR calc non Af Amer: 43 mL/min — ABNORMAL LOW (ref >60)
Glucose, Bld: 95 mg/dL (ref 70–99)
Potassium: 3.9 mmol/L (ref 3.5–5.1)
Sodium: 135 mmol/L (ref 135–145)

## 2019-02-23 LAB — CBC
HCT: 25.4 % — ABNORMAL LOW (ref 36.0–46.0)
Hemoglobin: 8.5 g/dL — ABNORMAL LOW (ref 12.0–15.0)
MCH: 29.7 pg (ref 26.0–34.0)
MCHC: 33.5 g/dL (ref 30.0–36.0)
MCV: 88.8 fL (ref 80.0–100.0)
Platelets: 176 10*3/uL (ref 150–400)
RBC: 2.86 MIL/uL — ABNORMAL LOW (ref 3.87–5.11)
RDW: 25 % — ABNORMAL HIGH (ref 11.5–15.5)
WBC: 5.2 10*3/uL (ref 4.0–10.5)
nRBC: 0.4 % — ABNORMAL HIGH (ref 0.0–0.2)

## 2019-02-23 LAB — PROTIME-INR
INR: 1.6 — ABNORMAL HIGH (ref 0.8–1.2)
Prothrombin Time: 18.5 seconds — ABNORMAL HIGH (ref 11.4–15.2)

## 2019-02-23 NOTE — Progress Notes (Signed)
NAME:  Carrie [name], MRN:  [id number], DOB:  1941/01/12, LOS: 6 ADMISSION DATE:  02/17/2019, CONSULTATION DATE:  02/17/2019 REFERRING MD:  Dr. Estanislado Pandy, CHIEF COMPLAINT:  AMS   Brief History   [name] who presented to [facility] 5/29 with left sided weakness, facial droop and slurred speech.  Work up consistent with large vessel occlusion and patient taken emergently to neuro IR for revascularization.  Past Medical History  SSS s/p Pacemker, Severe TR, Rheumatic Mitral Valve Disease , HTN, HLD, NSTEMI , CHF - chronic combined, CAD, AF - on anticoagulation, SDH   Significant Hospital Events   5/29-right M1 occlusion-->thrombectomy 5/31 extubated, tx to floor 6/3 worse cough and developed vomiting. SDH on CT head. Respiratory distress. PCCM consulted.  6/4 cough and hemoptysis improving. Working dx probable pneumonitis as result of vomiting resulting in hemoptysis  Consults:  PCCM  Procedures:  5/29 ETT   Significant Diagnostic Tests:  5/15 Echo > LVEF 60-65%. Severe biatrial enlargement, moderate to severe TR 5/18 RHC > She had mild-moderate PAH but main issues is severe TR. She is not candidate for surgical TVR. Could consider experimental TV clip but currently not enrolling patient with pacemaker wires. Continue medical therapy 5/29 CT Head CODE Stroke >> no acute abnormality  5/29 CTA Head / Neck >> acute occlusion of the right M1 segment, delayed collateral flow to the R M2 branches.  Marked dilatation of the jugular veins bilaterally likely due to elevated right heart pressures.  6/3 CT head > Unusual acute posterior fossa hemorrhage. In the right posterior fossa subdural location is favored and in the prepontine cistern clot appears subarachnoid. There is cerebellar mass effect with patent fourth ventricle.   Micro Data:  COVID negative 5/29   Antimicrobials:    Interim history/subjective:  Feels better  Cough improving   Objective   Blood pressure (Abnormal) 106/54, pulse 65,  temperature 98.4 F (36.9 C), temperature source Oral, resp. rate (Abnormal) 23, height 5\' 1"  (1.549 m), weight 59 kg, SpO2 100 %.        Intake/Output Summary (Last 24 hours) at 02/23/2019 0927 Last data filed at 02/23/2019 2836 Gross per 24 hour  Intake no documentation  Output 1475 ml  Net -1475 ml   Filed Weights   02/17/19 1100  Weight: 59 kg    Examination:  General pleasant 78 year old female up in chair. Stopping PT currently d/t HA HENT NCAT no JVD Pulm clear to auscultation. Improved cough and decreased hemoptysis  Card reg irreg abd not tender + bowel sounds Neuro intact Ext brisk CR no edema  gu voids  Resolved Hospital Problem list   Acute Respiratory Insufficiency requiring Mechanical Ventilation (perioperative)  Assessment & Plan:   Right M1 Occlusion / CVA - s/p revascularization of right common carotid  Subdural hematoma in the posterior fossa with mass effect (felt to be secondary to elevated venous pressures with new onset cough and vomiting.) Plan  Per neuro team   Hypoxia w/ new cough and hemoptysis; has chronic interstitial changes on CXR. Recent vomiting episode would make possible aspiration event most likely explanation  -clinically improving Plan Cont PRN morphine  Trend fever and WBC curve. Seems to be improving w/out abx so can hold off Will f/u in am.  We are holding [facility] d/t her SDH anyway. I expect the hemoptysis should resolve & we should be able to resume [facility] when it is safe from neuro-standpoint She will need high resolution CT of chest as  out-pt to better evaluate what seems to be chronic interstitial changes    Chronic diastolic CHF  SSS s/p Pacemaker  Hx Bioprosthetic MV AF - on coumadin Plan Cardiology following. Cardiology following Lasix per primary/cards service  Had Holding asa and warfarin for now given hemoptysis    Best practice:  Diet: Per primary Pain/Anxiety/Delirium protocol (if indicated): n/a VAP protocol (if  indicated):N/a DVT prophylaxis: SCDs GI prophylaxis: PPI, HOB >30 Glucose control: monitor Mobility: bed Code Status: full Family Communication: per priamry Disposition: cont to monitor   Erick Colace ACNP-BC [facility] Pager # [phone] OR # [phone] if no answer   02/23/2019 9:27 AM

## 2019-02-23 NOTE — Progress Notes (Signed)
Physical Therapy Treatment Patient Details Name: Carrie [name] MRN: [id number] DOB: May 18, 1941 Today's Date: 02/23/2019    History of Present Illness [name] who presented to [facility] 5/29 with left sided weakness, facial droop and slurred speech. 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 . The patient was taken to interventional radiology per Dr. 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 The patient was returned to ICU on mechanical ventilation, extubated 5/31. 6/3 CT revealed new acute posterior fossa hemorrhage.     PT Comments    Pt re-evaluated this session 2 new SDH found yesterday on CT. Pt has had a functional decline since prior to SDH, and tolerance for functional activity is decreased as well. I am hesitant to change d/c recommendations to SNF as I feel CIR would still be the most beneficial, however would like to see how pt progresses next session prior to making any official changes. Will continue to follow    Follow Up Recommendations  CIR;Supervision/Assistance - 24 hour     Equipment Recommendations  None recommended by PT    Recommendations for Other Services Rehab consult     Precautions / Restrictions Precautions Precautions: Fall Precaution Comments: bil LE edema Restrictions Weight Bearing Restrictions: No    Mobility  Bed Mobility Overal bed mobility: Needs Assistance Bed Mobility: Supine to Sit     Supine to sit: Mod assist     General bed mobility comments: increased time and effort, assist to pivot hips to full EOB position.  Transfers Overall transfer level: Needs assistance Equipment used: Rolling walker (2 wheeled) Transfers: Sit to/from [facility] Sit to Stand: Mod assist Stand pivot transfers: Mod assist;+2 safety/equipment       General transfer comment: Assist  to powerup. Cues for sequencing and assist to steady during pivoting. Cues for hand placement. Increased time required throughout.   Ambulation/Gait             General Gait Details: Gait training deferred this session as pt with 9/10 headache with supine>sit and sit>stand. Pt reports improvement with sitting in chair.    Stairs             Wheelchair Mobility    Modified Rankin (Stroke Patients Only) Modified Rankin (Stroke Patients Only) Pre-Morbid Rankin Score: Slight disability Modified Rankin: Moderately severe disability     Balance Overall balance assessment: Needs assistance Sitting-balance support: Bilateral upper extremity supported;Feet supported Sitting balance-Leahy Scale: Fair Sitting balance - Comments: onset of 9/10 headache pain once sitting EOb   Standing balance support: No upper extremity supported;During functional activity Standing balance-Leahy Scale: Poor Standing balance comment: reliant on BUE support. initially in trunk flexed position, cues for upright position                            Cognition Arousal/Alertness: Awake/alert Behavior During Therapy: Flat affect Overall Cognitive Status: No family/caregiver present to determine baseline cognitive functioning Area of Impairment: Following commands;Safety/judgement;Awareness;Problem solving                       Following Commands: Follows one step commands consistently;Follows one step commands with increased time;Follows multi-step commands inconsistently Safety/Judgement: Decreased awareness of safety Awareness: Emergent Problem Solving: Slow processing;Decreased initiation;Difficulty sequencing;Requires verbal cues General Comments: minimal verbalizations, pleasant  Exercises      General Comments General comments (skin integrity, edema, etc.): VSS throughout      Pertinent Vitals/Pain Pain Assessment: 0-10 Pain Score: 9  Pain Location: R  frontotemporal headache with supine to sit and sit to stand Pain Descriptors / Indicators: Headache Pain Intervention(s): Monitored during session    Home Living                      Prior Function            PT Goals (current goals can now be found in the care plan section) Acute Rehab PT Goals Patient Stated Goal: to get better PT Goal Formulation: With patient Time For Goal Achievement: 03/05/19 Potential to Achieve Goals: Good Progress towards PT goals: Progressing toward goals    Frequency    Min 4X/week      PT Plan Current plan remains appropriate    Co-evaluation PT/OT/SLP Co-Evaluation/Treatment: Yes Reason for Co-Treatment: Complexity of the patient's impairments (multi-system involvement);For patient/therapist safety;To address functional/ADL transfers PT goals addressed during session: Mobility/safety with mobility;Balance;Proper use of DME OT goals addressed during session: ADL's and self-care      AM-PAC PT "6 Clicks" Mobility   Outcome Measure  Help needed turning from your back to your side while in a flat bed without using bedrails?: A Little Help needed moving from lying on your back to sitting on the side of a flat bed without using bedrails?: A Little Help needed moving to and from a bed to a chair (including a wheelchair)?: A Lot Help needed standing up from a chair using your arms (e.g., wheelchair or bedside chair)?: A Little Help needed to walk in hospital room?: A Lot Help needed climbing 3-5 steps with a railing? : Total 6 Click Score: 14    End of Session Equipment Utilized During Treatment: Gait belt Activity Tolerance: Patient tolerated treatment well Patient left: in bed;with call bell/phone within reach;with bed alarm set Nurse Communication: Mobility status PT Visit Diagnosis: Unsteadiness on feet (R26.81);Other abnormalities of gait and mobility (R26.89);Muscle weakness (generalized) (M62.81)     Time: 6063-0160 PT  Time Calculation (min) (ACUTE ONLY): 28 min  Charges:   Re-Evaluation                     Rolinda Roan, PT, DPT Acute Rehabilitation Services Pager: [phone] Office: [phone]    Thelma Comp 02/23/2019, 1:56 PM

## 2019-02-23 NOTE — Progress Notes (Signed)
STROKE TEAM PROGRESS NOTE   SUBJECTIVE (INTERVAL HISTORY) Pt sitting in bed, walking with PT/OT. She stated that she still tired although she slept overnight but not in good sleep. She had no more N/V and neuro stable. No more HA today. Passed swallow and put on diet.   OBJECTIVE Vitals:   02/22/19 2351 02/23/19 0351 02/23/19 0835 02/23/19 1154  BP: (!) 105/59 110/60 (!) 106/54 105/66  Pulse: 82 68 65 81  Resp: 19 (!) 27 (!) 23 20  Temp: 98.1 F (36.7 C) 98.1 F (36.7 C) 98.4 F (36.9 C) 98.3 F (36.8 C)  TempSrc: Oral Oral Oral Oral  SpO2: 100% 97% 100% 100%  Weight:      Height:        CBC:  Recent Labs  Lab 02/17/19 1132  02/18/19 0314  02/22/19 0319 02/23/19 0539  WBC 3.7*  --  7.5   < > 6.3 5.2  NEUTROABS 2.3  --  5.4  --   --   --   HGB 9.4*   < > 8.8*   < > 8.5* 8.5*  HCT 29.9*   < > 25.9*   < > 25.6* 25.4*  MCV 93.4  --  85.5   < > 88.0 88.8  PLT 143*  --  175   < > 163 176   < > = values in this interval not displayed.    Basic Metabolic Panel:  Recent Labs  Lab 02/17/19 1500  02/20/19 0730  02/21/19 0952 02/22/19 0319 02/23/19 0539  NA  --    < > 137   < > 135 133* 135  K  --    < > 3.1*   < > 4.4 4.7 3.9  CL  --    < > 106   < > 105 102 101  CO2  --    < > 22   < > 22 22 24   GLUCOSE  --    < > 92   < > 120* 101* 95  BUN  --    < > 24*   < > 31* 34* 32*  CREATININE  --    < > 1.13*   < > 1.23* 1.16* 1.21*  CALCIUM  --    < > 9.0   < > 9.1 9.1 9.4  MG 2.4  --  1.8  --   --   --   --   PHOS  --   --   --   --  2.2*  --   --    < > = values in this interval not displayed.    Lipid Panel:     Component Value Date/Time   CHOL 81 02/18/2019 0314   TRIG 34 02/19/2019 0358   HDL 28 (L) 02/18/2019 0314   CHOLHDL 2.9 02/18/2019 0314   VLDL 12 02/18/2019 0314   LDLCALC 41 02/18/2019 0314   HgbA1c:  Lab Results  Component Value Date   HGBA1C 5.2 02/18/2019   Urine Drug Screen:     Component Value Date/Time   LABOPIA NONE DETECTED 02/17/2019  1500   COCAINSCRNUR NONE DETECTED 02/17/2019 1500   LABBENZ NONE DETECTED 02/17/2019 1500   AMPHETMU NONE DETECTED 02/17/2019 1500   THCU NONE DETECTED 02/17/2019 1500   LABBARB NONE DETECTED 02/17/2019 1500    Alcohol Level     Component Value Date/Time   ETH <10 02/17/2019 1132    IMAGING Ct Head Code Stroke Wo Contrast 02/17/2019 IMPRESSION:  1. No acute intracranial abnormality  2. ASPECTS is 10   Ct Angio Head W Or Wo Contrast Ct Angio Neck W Or Wo Contrast 02/17/2019 IMPRESSION:  1. Occlusion right M1 segment which appears acute. Delayed collateral flow to right M2 branches.  2. No significant carotid or vertebral artery stenosis in the neck.  3. Marked enlargement of the superior ophthalmic vein bilaterally. No evidence of carotid cavernous fistula. Marked dilatation of the jugular veins bilaterally is chronic and likely due to elevated right heart pressures. The patient has history of cardiac valvular disease.   Ct Head Wo Contrast 02/17/2019 IMPRESSION:  No acute intracranial abnormality. Atrophy and chronic microvascular ischemic change.   Repeat CT Head WO Contrast 02/18/2019 No acute intracranial abnormality by noncontrast CT. Stable atrophy and chronic white matter microvascular changes.   Ct Head Wo Contrast 02/22/2019 1132 IMPRESSION:  Unusual acute posterior fossa hemorrhage. In the right posterior fossa subdural location is favored and in the prepontine cistern clot appears subarachnoid. There is cerebellar mass effect with patent fourth ventricle.  Ct Head Wo Contrast 02/22/2019 1800 IMPRESSION:  Stable to slightly improved RIGHT posterior fossa extra-axial hematoma, favored to be subdural. Continued surveillance is warranted.  CXR 02/23/2019 Mild vascular congestion and small right pleural effusion.  Transthoracic Echocardiogram  02/03/2019 IMPRESSIONS  1. The left ventricle has normal systolic function with an ejection fraction of 60-65%. The cavity  size was normal. There is moderately increased left ventricular wall thickness. Left ventricular diastolic function could not be evaluated due to mitral valve replacement/repair. There is abnormal septal motion consistent with RV pacemaker and right ventricular volume and pressure overload.  2. The right ventricle has moderately reduced systolic function. The cavity was moderately enlarged. There is no increase in right ventricular wall thickness.  3. Left atrial size was severely dilated.  4. Right atrial size was severely dilated.  5. A MVR mechanical valve is present in the mitral position.  6. The tricuspid valve is grossly normal. Tricuspid valve regurgitation is moderate-severe.  7. The aortic valve is tricuspid. Mild calcification of the aortic valve. Aortic valve regurgitation is mild by color flow Doppler. Mild stenosis of the aortic valve.  8. The inferior vena cava was dilated in size with <50% respiratory variability.  9. The interatrial septum was not well visualized. 10. When compared to the prior study: 03/31/2018: LVEF 60-65%, RVSP 66 mmHg.  EKG - atrial fibrillation - ventricular response 68 BPM (See cardiology reading for complete details)   PHYSICAL EXAM  Temp:  [97.8 F (36.6 C)-98.4 F (36.9 C)] 98.3 F (36.8 C) (06/04 1154) Pulse Rate:  [49-82] 81 (06/04 1154) Resp:  [18-27] 20 (06/04 1154) BP: (105-123)/(54-66) 105/66 (06/04 1154) SpO2:  [97 %-100 %] 100 % (06/04 1154)  General - Well nourished, well developed, mild lethargy but not in distress  Ophthalmologic - fundi not visualized due to noncooperation.  Cardiovascular - irregularly irregular heart rate and rhythm.  Neuro - mild lethargy, but awake alert orientated to time place and people.  Language fluent, following simple commands, intact naming and repetition. PERRL, EOMI, no gaze preference, visual field full, tracking bilaterally. Facial symmetry and tongue midline on protrusion. BUE 4/5 and BLE 3+/5. DTR  1+ and no babinski. Sensation symmetrical, coordination seems to have Left FTN mild dysmetria and gait not tested.   ASSESSMENT/PLAN [name] is a 78 y.o. female with history of a-fib, mechanincal heart valve, warfarin therapy, pacemaker, prior SDH, CAD, CHF, HLD, HTN, NSTEMI in  2017 and prior stroke presenting after she collapsed with right sided gaze preference, left facial droop and left hemiplegia. She did not receive IV t-PA due to warfarin anticoagulation with INR 1.9.  Stroke: suspected Rt MCA infarct with right M1 cut off s/p IR with TICI3 reperfusion, likely due to mechanical valve and A. fib with subtherapeutic INR. Developed R posterior fossa SDH and pre-pontine SAH  Code Stroke CT head - No acute intracranial abnormality. APECTS 10   MRI head - PPM not compatible with MRI  CTA H&N - Occlusion right M1 segment which appears acute.   IR with TICI3 reperfusion  Repeat Head CT 5/29 -no acute intracranial abnormality  Repeat Head CT 5/30 -no acute intracranial abnormality  Head CT 6/3 1132 - unusual acute posterior fossa SDH and perimesencephalic SAH. Cerebellar mass effect on 4th ventricle  Repeat Heat CT 6/3 1800 showed stable/improved SDH/SAH  INR 1.9 -> 1.7->1.8->1.5->1.6  2D Echo - EF 60 - 65%. No cardiac source of emboli identified.   Sars Corona Virus 2  - negative  LDL - 41  HgbA1c - 5.2  UDS  - negative  VTE prophylaxis - IV hep->warfarin  warfarin daily prior to admission, treated with Heparin IV and Coumadin given mechanical valve and atrial fibrillation.  INR goal changed to 2.5-3.0 after discussion with Dr. Percival Spanish. Also per Dr. Percival Spanish, added aspirin to warfarin given class I indication.->heparin, warfarin and asa now stopped given hmg. Plan to restart heparin and coumadin after repeat CT 6/5 if neuro stable  Therapy recommendations:  CIR   Disposition:  Pending  Plan d/t to CIR tomorrow if pt remains stable and bed available.  SDH and  SAH  Headache with nausea vomiting  CT stat 6/3 showed right posterior fossa SDH with perimesencephalic SAH  Heparin reversed with protamine  Coumadin and aspirin discontinued  Repeat CT 6/3 showed stable/improved SDH and SAH  CT repeat 6/5 pending  INR 1.9 -> 1.7->1.8->1.5->1.6  Continue monitoring  Etiology likely due to coughing in the setting of severe increased venous pressure given significantly engorged IJ on CTA neck and history of pulmonary hypertension  Continue guanfacine every 6 hours scheduled  Plan to restart heparin and coumadin after CT repeat 6/5 if neuro stable  Mechanical mitral valve  Followed by cardiology  On Coumadin PTA  INR 1.9 on admission  INR goal changed to 2.0-2.5 due to history of SDH/SAH and liver hemorrhage (normal condition, INR goal 2.5-3.5)  Stop heparin IV due to SDH and SAH  Stop ASA 81 due to SDH and SAH  INR 1.9->1.7->1.8->1.5->1.6  Plan to restart heparin and coumadin after CT repeat 6/5 if neuro stable  Chronic A. 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  Also found to have small SAH suspected posttraumatic versus septic emboli, which subsequently resolved, put on heparin drip bridged to Coumadin.   Acute on chronic CHF   h/o R heart failure and severe TR w/ pulm HTN   TR is "felt to be related to PAH+tethering of TV due to pacemaker leads. Patient is not a good candidate for repeat sternotomy and valve repair.Unfortunately not candidate for TV clip due to presence of PM wire."  Cardiology on board  CXR today stable vascular congestion, small right pericardial effusion  Continue Lasix and  spironolactone  Hypotension History of hypertension  BP stable  off neo  On Lasix and spironolactone  BP goal normotensive  Hyperlipidemia  Lipid lowering medication PTA:  Lipitor 40 mg daily  LDL 41, goal < 70  DC Lipitor for now due to SDH/SAH  Continue statin at discharge  Anemia  Hemoglobin 9.4-10.5-8.8-9.1-8.6-8.7-8.5  Treated with IV heparin and coumadin , now off d/t SAH/SDH  close CBC monitoring  PRBC transfusion if Hb < 7  Dysphagia   Extubated 5/31  Speech following  was n.p.o. due to vomiting and concern of aspiration  Now passed swallow, on diet  Other Stroke Risk Factors  Advanced age  Family hx stroke (father and brother)  Coronary artery disease, non-STEMI 2017  Migraines  Other Active Problems  CKD stage III creatinine - 1.38->1.40->1.29->1.31->1.13->1.23>1.16->1.21  SSS w/p Pacemaker  Hypokalemia - 3.5->2.8->3.1->5.2-4.4-4.7-3.9  Intermittent coughing with hemoptysis - resolved- CXR neg  Hospital day # 6   Carrie Hawking, MD PhD Stroke Neurology 02/23/2019 2:32 PM  To contact Stroke Continuity provider, please refer to [email or web address] After hours, contact General Neurology

## 2019-02-23 NOTE — Progress Notes (Addendum)
Progress Note  Patient Name: Carrie [name] Date of Encounter: 02/23/2019  Primary Cardiologist: [name], MD  Subjective   Feeling pretty good. No CP or SOB. Slow cadence of speech but A+Ox3. She was named by the doctor that delivered her. Still with hemoptysis in suction.  No longer on ASA or heparin due to intracranial bleed.  Inpatient Medications    Scheduled Meds: . Chlorhexidine Gluconate Cloth  6 each Topical Daily  . furosemide  80 mg Oral BID  . guaiFENesin  5 mL Oral Q6H  . metoprolol tartrate  25 mg Oral BID  . pantoprazole (PROTONIX) IV  40 mg Intravenous Q24H  . spironolactone  12.5 mg Oral Daily   Continuous Infusions:  PRN Meds: acetaminophen **OR** [DISCONTINUED] acetaminophen (TYLENOL) oral liquid 160 mg/5 mL **OR** acetaminophen, albuterol, bisacodyl, docusate, hydrALAZINE, morphine injection, nitroGLYCERIN, ondansetron (ZOFRAN) IV, polyvinyl alcohol, senna-docusate   Vital Signs    Vitals:   02/22/19 1957 02/22/19 2351 02/23/19 0351 02/23/19 0835  BP: 123/66 (!) 105/59 110/60 (!) 106/54  Pulse: (!) 49 82 68 65  Resp: 18 19 (!) 27 (!) 23  Temp: 98 F (36.7 C) 98.1 F (36.7 C) 98.1 F (36.7 C) 98.4 F (36.9 C)  TempSrc: Oral Oral Oral Oral  SpO2: 100% 100% 97% 100%  Weight:      Height:        Intake/Output Summary (Last 24 hours) at 02/23/2019 0948 Last data filed at 02/23/2019 0624 Gross per 24 hour  Intake -  Output 1475 ml  Net -1475 ml   Last 3 Weights 02/17/2019 02/07/2019 02/06/2019  Weight (lbs) 130 lb 1.1 oz 132 lb 3.2 oz 133 lb 1.6 oz  Weight (kg) 59 kg 59.966 kg 60.374 kg     Telemetry    Atrial fib HR 70s-90s - Personally Reviewed   Physical Exam   GEN: No acute distress.  HEENT: Normocephalic, atraumatic, sclera non-icteric. Neck: No JVD or bruits. Cardiac: Irregularly irregular, 2/6 SEM RSB, mechanical S1, prominent S2 Radials/DP/PT 1+ and equal bilaterally.  Respiratory: Few scattered wheezes, coarse. Breathing is  unlabored. GI: Soft, nontender, non-distended, BS +x 4. MS: no deformity. Extremities: No clubbing or cyanosis. No edema. Distal pedal pulses are 2+ and equal bilaterally. Neuro:  AAOx3 but slow candence of speech. Follows commands. Psych:  Responds to questions appropriately with a normal affect.  Labs    Chemistry Recent Labs  Lab 02/17/19 1132  02/21/19 0952 02/22/19 0319 02/23/19 0539  NA 137   < > 135 133* 135  K 4.9   < > 4.4 4.7 3.9  CL 107   < > 105 102 101  CO2 20*   < > 22 22 24   GLUCOSE 94   < > 120* 101* 95  BUN 41*   < > 31* 34* 32*  CREATININE 1.38*   < > 1.23* 1.16* 1.21*  CALCIUM 9.6   < > 9.1 9.1 9.4  PROT 8.0  --   --   --   --   ALBUMIN 3.3*  --  2.8*  --   --   AST 98*  --   --   --   --   ALT 23  --   --   --   --   ALKPHOS 129*  --   --   --   --   BILITOT 1.2  --   --   --   --   GFRNONAA 37*   < >  42* 45* 43*  GFRAA 43*   < > 49* 53* 50*  ANIONGAP 10   < > 8 9 10    < > = values in this interval not displayed.     Hematology Recent Labs  Lab 02/21/19 0400 02/22/19 0319 02/23/19 0539  WBC 6.6 6.3 5.2  RBC 3.01* 2.91* 2.86*  HGB 8.7* 8.5* 8.5*  HCT 26.6* 25.6* 25.4*  MCV 88.4 88.0 88.8  MCH 28.9 29.2 29.7  MCHC 32.7 33.2 33.5  RDW 25.2* 24.7* 25.0*  PLT 161 163 176    Cardiac EnzymesNo results for input(s): TROPONINI in the last 168 hours. No results for input(s): TROPIPOC in the last 168 hours.   BNPNo results for input(s): BNP, PROBNP in the last 168 hours.   DDimer No results for input(s): DDIMER in the last 168 hours.   Radiology    Dg Chest 2 View  Result Date: 02/22/2019 CLINICAL DATA:  Hemoptysis EXAM: CHEST - 2 VIEW COMPARISON:  Yesterday FINDINGS: Cardiopericardial enlargement and vascular pedicle widening. There has been mitral valve replacement. Vascular congestion and right pleural effusion with presumed atelectasis. No pneumothorax. Single chamber pacer into the right ventricle. IMPRESSION: Stable cardiomegaly and  vascular congestion with small right pleural effusion. Electronically Signed   By: Monte Fantasia M.D.   On: 02/22/2019 05:29   Ct Head Wo Contrast  Result Date: 02/22/2019 CLINICAL DATA:  Subdural hemorrhage follow-up. EXAM: CT HEAD WITHOUT CONTRAST TECHNIQUE: Contiguous axial images were obtained from the base of the skull through the vertex without intravenous contrast. COMPARISON:  02/22/2019. FINDINGS: Brain: Unchanged, and unusual RIGHT posterior fossa extra-axial hematoma, favored to be subdural, with hematocrit level of dense red cells above which is layering supernatant. Overall thickness is no worse, may be minimally improved, 10-12 mm. There is still mass effect on the fourth ventricle with RIGHT-to-LEFT shift, which remains concerning. In the prepontine cistern, subarachnoid blood is observed, also no worse. No parenchymal hemorrhage or hydrocephalus. Atrophy and small vessel disease. Vascular: Calcification of the cavernous internal carotid arteries consistent with cerebrovascular atherosclerotic disease. No signs of intracranial large vessel occlusion. Skull: Surprisingly, no visible skull fracture or sutural diastasis. No pneumocephalus. Sinuses/Orbits: Unchanged sinuses. BILATERAL orbital varices. Other: None. IMPRESSION: Stable to slightly improved RIGHT posterior fossa extra-axial hematoma, favored to be subdural. Continued surveillance is warranted. Electronically Signed   By: Staci Righter M.D.   On: 02/22/2019 18:23   Ct Head Wo Contrast  Result Date: 02/22/2019 CLINICAL DATA:  Stroke follow-up EXAM: CT HEAD WITHOUT CONTRAST TECHNIQUE: Contiguous axial images were obtained from the base of the skull through the vertex without intravenous contrast. COMPARISON:  02/18/2019 FINDINGS: Brain: Interval posterior fossa hemorrhage with hematocrit level along the lateral right cerebellum measuring up to 14 mm in thickness. This collection is favored subdural in location, but there is also  hemorrhage in the prepontine cistern that appears to be more subarachnoid. There is mass effect on the right cerebellum with fourth ventricular distortion but no hydrocephalus. Chronic small vessel ischemic injury to the supratentorial white matter and caudate nuclei. Vascular: No hyperdense vessel. Skull: Negative Sinuses/Orbits: Bilateral cataract resection. 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 Jugular veins are dilated on prior CTA. There is history of valvular cardiac disease which is the presumed cause. Other: Critical Value/emergent results were called by telephone at the time of interpretation on 02/22/2019 at 11:42 am to Dr. Rosalin Hawking , who verbally acknowledged these results. IMPRESSION: Unusual acute posterior  fossa hemorrhage. In the right posterior fossa subdural location is favored and in the prepontine cistern clot appears subarachnoid. There is cerebellar mass effect with patent fourth ventricle. Electronically Signed   By: Monte Fantasia M.D.   On: 02/22/2019 11:50   Dg Chest Port 1 View  Result Date: 02/22/2019 CLINICAL DATA:  78 year old female with hemoptysis, aspiration. EXAM: PORTABLE CHEST 1 VIEW COMPARISON:  0449 hours today and earlier. FINDINGS: Portable AP semi upright view at 1444 hours. Stable cardiomegaly and mediastinal contours. Stable left chest single lead cardiac pacemaker. Lung volumes and ventilation are stable since 02/17/2019. Cardiomegaly with possible small right pleural effusion. No overt edema. No pneumothorax. No new pulmonary opacity. Paucity of bowel gas in the upper abdomen. IMPRESSION: Stable ventilation since 02/17/2019. No new cardiopulmonary abnormality. Electronically Signed   By: Genevie Ann M.D.   On: 02/22/2019 14:58   Dg Chest Port 1 View  Result Date: 02/21/2019 CLINICAL DATA:  Shortness of breath EXAM: PORTABLE CHEST 1 VIEW COMPARISON:  Portable exam 1037 hours compared to 02/20/2019 FINDINGS: LEFT subclavian  pacemaker lead projects over RIGHT ventricle. Enlargement of cardiac silhouette post median sternotomy and MVR. Rotated to the RIGHT. Pulmonary vascular congestion. Probable minimal perihilar edema with atelectasis at RIGHT base. No pleural effusion or pneumothorax. IMPRESSION: Enlargement of cardiac silhouette with vascular congestion and probable minimal pulmonary edema. RIGHT basilar atelectasis. Electronically Signed   By: Lavonia Dana M.D.   On: 02/21/2019 10:55    Cardiac Studies   2D echo 02/03/19 IMPRESSIONS    1. The left ventricle has normal systolic function with an ejection fraction of 60-65%. The cavity size was normal. There is moderately increased left ventricular wall thickness. Left ventricular diastolic function could not be evaluated due to mitral  valve replacement/repair. There is abnormal septal motion consistent with RV pacemaker and right ventricular volume and pressure overload.  2. The right ventricle has moderately reduced systolic function. The cavity was moderately enlarged. There is no increase in right ventricular wall thickness.  3. Left atrial size was severely dilated.  4. Right atrial size was severely dilated.  5. A MVR mechanical valve is present in the mitral position.  6. The tricuspid valve is grossly normal. Tricuspid valve regurgitation is moderate-severe.  7. The aortic valve is tricuspid. Mild calcification of the aortic valve. Aortic valve regurgitation is mild by color flow Doppler. Mild stenosis of the aortic valve.  8. The inferior vena cava was dilated in size with <50% respiratory variability.  9. The interatrial septum was not well visualized. 10. When compared to the prior study: 03/31/2018: LVEF 60-65%, RVSP 66 mmHg.  Patient Profile     78 y.o. 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 She was admitted with stroke and M1 occlusion treated with thrombectomy. Stroke felt due to atrial fib and mechanical valve with subtherapeutic INR. On 6/3 she developed headache, vomiting, cough and respiratory distress with SDH/SAH on  CT head. PCCM consulted -  working dx probable pneumonitis as result of vomiting resulting in hemoptysis. Intracranial bleed felt likely due to coughing in the setting of severe increased venous pressure given significantly  engorged IJ on CTA neck and history of pulmonary hypertension. IV heparin and aspirin were stopped, and prootamine given. INR goal changed to 2-2.5 given SDH and liver hemorrhage. Cardiology following for SOB.  Assessment & Plan    1. Acute stroke felt due to mechanical valve/afib and subtherapeutic INR, s/p thrombectomy complicated by subsequent SAH/SDH with aspirin and heparin on hold. Difficult situation. Remains at risk for recurrent embolic events due to mechanical valve and atrial fib but per neuro unable to use heparin/ASA due to intracranial bleed and also with intermittent hemoptysis being followed by pulm. Goal INR now documented as 2-2.5.  2. Shortness of breath with acute on chronic diastolic CHF - improved breathing. Continue oral Lasix and spironolactone. Has not been weighed in several days, will order. She is -3.6L.  3. H/o right heart failure and severe TR with pulm HTN - seen by [facility] team last admission 5/19 with RHC. Per their notes, TR is "felt to be related to [facility] + tethering of TV due to pacemaker leads. Patient is not a good candidate for repeat sternotomy and valve repair. Unfortunately not candidate for TV clip due to presence of PM wire."  4. Anemia - per primary  teams.  5. Permanent atrial fibrillation - HR OK on metoprolol.  For questions or updates, please contact [place] Please consult [email or web address] for contact info under Cardiology/STEMI.  Signed, Charlie Pitter, PA-C 02/23/2019, 9:48 AM

## 2019-02-23 NOTE — Progress Notes (Signed)
Occupational Therapy Treatment Patient Details Name: Carrie [name] MRN: [id number] DOB: 11/01/1940 Today's Date: 02/23/2019    History of present illness [name] who presented [facility] 5/[facility] with left sided weakness, facial droop and slurred speech. 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 . The patient was taken to interventional radiology per Dr. 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 The patient was returned to ICU on mechanical ventilation, extubated 5/31. 6/3 CT revealed new acute posterior fossa hemorrhage.    OT comments  Pt progressing towards acute OT goals. Focus of session was bed mobility, grooming task, and simulated toilet transfer. Pt was min-mod A +2 for safety with stand pivot transfer. Pt with onset of 9/10 headache in R temporal area once sitting EOB but agreeable to continue working with therapy. D/c plan remains appropriate.   Follow Up Recommendations  CIR;Supervision/Assistance - 24 hour    Equipment Recommendations  None recommended by OT    Recommendations for Other Services      Precautions / Restrictions Precautions Precautions: Fall Restrictions Weight Bearing Restrictions: No       Mobility Bed Mobility Overal bed mobility: Needs Assistance Bed Mobility: Supine to Sit     Supine to sit: Mod assist     General bed mobility comments: increased time and effort, assist to pivot hips to full EOB position.  Transfers Overall transfer level: Needs assistance Equipment used: Rolling walker (2 wheeled) Transfers: Sit to/from [facility] Sit to Stand: Mod assist Stand pivot transfers: Mod assist;+2 safety/equipment       General transfer comment: Assist to powerup. Cues for sequencing and assist to steady during pivoting. Cues for hand placement.    Balance Overall  balance assessment: Needs assistance Sitting-balance support: Bilateral upper extremity supported;Feet supported Sitting balance-Leahy Scale: Fair Sitting balance - Comments: onset of 9/10 headache pain once sitting EOb   Standing balance support: No upper extremity supported;During functional activity Standing balance-Leahy Scale: Poor Standing balance comment: reliant on BUE support. initially in trunk flexed position, cues for upright position                           ADL either performed or assessed with clinical judgement   ADL Overall ADL's : Needs assistance/impaired     Grooming: Set up;Wash/dry face                   Toilet Transfer: Moderate assistance;+2 for safety/equipment;Stand-pivot;BSC;RW Toilet Transfer Details (indicate cue type and reason): simulated with EOB>recliner. Increased time and effort. Cues for sequencing movements           General ADL Comments: Pt completed grooming task in bed, bed mobility and SPT to recliner.     Vision       Perception     Praxis      Cognition Arousal/Alertness: Awake/alert Behavior During Therapy: Flat affect Overall Cognitive Status: No family/caregiver present to determine baseline cognitive functioning Area of Impairment: Following commands;Safety/judgement;Awareness;Problem solving                       Following Commands: Follows one step commands consistently;Follows one step commands with increased time;Follows multi-step commands inconsistently Safety/Judgement: Decreased awareness of safety Awareness: Emergent Problem Solving: Slow processing;Decreased initiation;Difficulty sequencing;Requires verbal cues General Comments: minimal verbalizations,  pleasant        Exercises     Shoulder Instructions       General Comments VSS throughout    Pertinent Vitals/ Pain       Pain Assessment: No/denies pain  Home Living                                           Prior Functioning/Environment              Frequency  Min 2X/week        Progress Toward Goals  OT Goals(current goals can now be found in the care plan section)  Progress towards OT goals: Progressing toward goals  Acute Rehab OT Goals Patient Stated Goal: to get better OT Goal Formulation: With patient Time For Goal Achievement: 03/06/19 Potential to Achieve Goals: Good ADL Goals Pt Will Perform Grooming: with supervision;standing Pt Will Perform Lower Body Dressing: with min assist;sit to/from stand Pt Will Transfer to Toilet: with supervision;ambulating Pt Will Perform Toileting - Clothing Manipulation and hygiene: with modified independence;sit to/from stand Pt/caregiver will Perform Home Exercise Program: Both right and left upper extremity;Increased strength;With written HEP provided  Plan Discharge plan remains appropriate    Co-evaluation    PT/OT/SLP Co-Evaluation/Treatment: Yes Reason for Co-Treatment: To address functional/ADL transfers;For patient/therapist safety   OT goals addressed during session: ADL's and self-care      AM-PAC OT "6 Clicks" Daily Activity     Outcome Measure   Help from another person eating meals?: A Little Help from another person taking care of personal grooming?: A Little Help from another person toileting, which includes using toliet, bedpan, or urinal?: A Lot Help from another person bathing (including washing, rinsing, drying)?: A Lot Help from another person to put on and taking off regular upper body clothing?: None Help from another person to put on and taking off regular lower body clothing?: A Lot 6 Click Score: 16    End of Session Equipment Utilized During Treatment: Gait belt;Rolling walker;Oxygen  OT Visit Diagnosis: Other abnormalities of gait and mobility (R26.89);Muscle weakness (generalized) (M62.81)   Activity Tolerance Other (comment);Patient tolerated treatment well(onset of 9/10 headache pain  once EOB)   Patient Left in chair;with call bell/phone within reach;with chair alarm set   Nurse Communication          Time: [id number] OT Time Calculation (min): 25 min  Charges: OT General Charges $OT Visit: 1 Visit OT Treatments $Self Care/Home Management : 8-22 mins  Carrie [name], OT Acute Rehabilitation Services Pager: [phone] Office: [phone]    Carrie [name] 02/23/2019, 11:05 AM

## 2019-02-23 NOTE — Plan of Care (Signed)
  Problem: Clinical Measurements: Goal: Respiratory complications will improve Outcome: Progressing   Problem: Clinical Measurements: Goal: Diagnostic test results will improve Outcome: Progressing   Problem: Clinical Measurements: Goal: Cardiovascular complication will be avoided Outcome: Progressing   Problem: Activity: Goal: Risk for activity intolerance will decrease Outcome: Progressing   Problem: Elimination: Goal: Will not experience complications related to bowel motility Outcome: Progressing   Problem: Pain Managment: Goal: General experience of comfort will improve Outcome: Progressing   Problem: Safety: Goal: Ability to remain free from injury will improve Outcome: Progressing

## 2019-02-23 NOTE — Progress Notes (Signed)
  Speech Language Pathology Treatment: Dysphagia  Patient Details Name: Carrie [name] MRN: [id number] DOB: 03-05-1941 Today's Date: 02/23/2019 Time: 5638-9373 SLP Time Calculation (min) (ACUTE ONLY): 8 min  Assessment / Plan / Recommendation Clinical Impression  Following up for dysphagia. Pt developed hypoxia w/ new cough and hemoptysis and has chronic interstitial changes on CXR. Critical care consulted who reported recent vomiting episode would make possible aspiration event most likely explanation. Today there were no s/s aspiration with thin straw sips or cracker. Some minimal dyspnea, likely baseline. Reviewed strategies to decrease effects of GERD. Continue regular texture, thin and discharge ST at this time. Likely transfer to CIR soon.   HPI HPI: 78 y/o who presented to [facility] 5/29 with left sided weakness, facial droop and slurred speech. CTA of the head/ neck showed acute occlusion of the right M1 segment, delayed collateral flow to the R M2 branches. Patient underwent a common carotid arteriogram followed by complete revascularization of the right MCA M1 occlusion. Intubated 5/29-5/31. CXR Stable right pleural effusion and bibasilar subsegmental atelectasis. CT of the head of 02/18/19 was negative for acute changes.       SLP Plan  All goals met;Discharge SLP treatment due to (comment)       Recommendations  Diet recommendations: Regular;Thin liquid Liquids provided via: Straw;Cup Medication Administration: Whole meds with liquid Supervision: Patient able to self feed Compensations: Slow rate;Small sips/bites Postural Changes and/or Swallow Maneuvers: Seated upright 90 degrees;Upright 30-60 min after meal                Oral Care Recommendations: Oral care BID Follow up Recommendations: None SLP Visit Diagnosis: Dysphagia, unspecified (R13.10) Plan: All goals met;Discharge SLP treatment due to (comment)       GO                Houston Siren 02/23/2019, 11:25  AM  Orbie Pyo Colvin Caroli.Ed Risk analyst [phone] Office [phone]

## 2019-02-23 NOTE — Progress Notes (Signed)
Inpatient Rehab Admissions Coordinator:    Met with pt at bedside.  She states she feels much better today except for HA pain.  Still open to CIR. Will look for possible admission tomorrow pending bed availability.   Shann Medal, PT, DPT Admissions Coordinator [phone] 02/23/19  11:55 AM

## 2019-02-24 ENCOUNTER — Inpatient Hospital Stay (HOSPITAL_COMMUNITY): Payer: Medicare Other

## 2019-02-24 ENCOUNTER — Other Ambulatory Visit: Payer: Self-pay

## 2019-02-24 ENCOUNTER — Inpatient Hospital Stay (HOSPITAL_COMMUNITY)
Admission: RE | Admit: 2019-02-24 | Discharge: 2019-03-08 | DRG: 056 | Disposition: A | Discharge status: Home Health | Payer: Medicare Other | Source: Intra-hospital | Attending: Physical Medicine & Rehabilitation | Admitting: Physical Medicine & Rehabilitation

## 2019-02-24 DIAGNOSIS — R519 Headache, unspecified: Secondary | ICD-10-CM

## 2019-02-24 DIAGNOSIS — I482 Chronic atrial fibrillation, unspecified: Secondary | ICD-10-CM

## 2019-02-24 DIAGNOSIS — I639 Cerebral infarction, unspecified: Secondary | ICD-10-CM | POA: Diagnosis present

## 2019-02-24 DIAGNOSIS — I63411 Cerebral infarction due to embolism of right middle cerebral artery: Secondary | ICD-10-CM | POA: Diagnosis present

## 2019-02-24 DIAGNOSIS — R791 Abnormal coagulation profile: Secondary | ICD-10-CM | POA: Diagnosis not present

## 2019-02-24 DIAGNOSIS — I63511 Cerebral infarction due to unspecified occlusion or stenosis of right middle cerebral artery: Secondary | ICD-10-CM

## 2019-02-24 DIAGNOSIS — N3289 Other specified disorders of bladder: Secondary | ICD-10-CM | POA: Diagnosis present

## 2019-02-24 DIAGNOSIS — N183 Chronic kidney disease, stage 3 (moderate): Secondary | ICD-10-CM | POA: Diagnosis not present

## 2019-02-24 DIAGNOSIS — Z7901 Long term (current) use of anticoagulants: Secondary | ICD-10-CM

## 2019-02-24 DIAGNOSIS — I69351 Hemiplegia and hemiparesis following cerebral infarction affecting right dominant side: Principal | ICD-10-CM

## 2019-02-24 DIAGNOSIS — G479 Sleep disorder, unspecified: Secondary | ICD-10-CM | POA: Diagnosis not present

## 2019-02-24 DIAGNOSIS — I495 Sick sinus syndrome: Secondary | ICD-10-CM | POA: Diagnosis not present

## 2019-02-24 DIAGNOSIS — I130 Hypertensive heart and chronic kidney disease with heart failure and stage 1 through stage 4 chronic kidney disease, or unspecified chronic kidney disease: Secondary | ICD-10-CM | POA: Diagnosis present

## 2019-02-24 DIAGNOSIS — I2510 Atherosclerotic heart disease of native coronary artery without angina pectoris: Secondary | ICD-10-CM | POA: Diagnosis present

## 2019-02-24 DIAGNOSIS — R269 Unspecified abnormalities of gait and mobility: Secondary | ICD-10-CM | POA: Diagnosis not present

## 2019-02-24 DIAGNOSIS — Z8679 Personal history of other diseases of the circulatory system: Secondary | ICD-10-CM | POA: Diagnosis not present

## 2019-02-24 DIAGNOSIS — I5042 Chronic combined systolic (congestive) and diastolic (congestive) heart failure: Secondary | ICD-10-CM | POA: Diagnosis present

## 2019-02-24 DIAGNOSIS — F419 Anxiety disorder, unspecified: Secondary | ICD-10-CM | POA: Diagnosis present

## 2019-02-24 DIAGNOSIS — Z950 Presence of cardiac pacemaker: Secondary | ICD-10-CM

## 2019-02-24 DIAGNOSIS — Z955 Presence of coronary angioplasty implant and graft: Secondary | ICD-10-CM | POA: Diagnosis not present

## 2019-02-24 DIAGNOSIS — I69328 Other speech and language deficits following cerebral infarction: Secondary | ICD-10-CM

## 2019-02-24 DIAGNOSIS — J9601 Acute respiratory failure with hypoxia: Secondary | ICD-10-CM

## 2019-02-24 DIAGNOSIS — E785 Hyperlipidemia, unspecified: Secondary | ICD-10-CM | POA: Diagnosis not present

## 2019-02-24 DIAGNOSIS — I6349 Cerebral infarction due to embolism of other cerebral artery: Secondary | ICD-10-CM | POA: Diagnosis not present

## 2019-02-24 DIAGNOSIS — I5032 Chronic diastolic (congestive) heart failure: Secondary | ICD-10-CM | POA: Diagnosis not present

## 2019-02-24 DIAGNOSIS — D649 Anemia, unspecified: Secondary | ICD-10-CM

## 2019-02-24 DIAGNOSIS — Z9889 Other specified postprocedural states: Secondary | ICD-10-CM | POA: Diagnosis not present

## 2019-02-24 DIAGNOSIS — R042 Hemoptysis: Secondary | ICD-10-CM | POA: Diagnosis present

## 2019-02-24 DIAGNOSIS — Z952 Presence of prosthetic heart valve: Secondary | ICD-10-CM

## 2019-02-24 DIAGNOSIS — K219 Gastro-esophageal reflux disease without esophagitis: Secondary | ICD-10-CM | POA: Diagnosis present

## 2019-02-24 DIAGNOSIS — I252 Old myocardial infarction: Secondary | ICD-10-CM | POA: Diagnosis not present

## 2019-02-24 DIAGNOSIS — I609 Nontraumatic subarachnoid hemorrhage, unspecified: Secondary | ICD-10-CM | POA: Diagnosis present

## 2019-02-24 DIAGNOSIS — R351 Nocturia: Secondary | ICD-10-CM | POA: Diagnosis present

## 2019-02-24 DIAGNOSIS — I69392 Facial weakness following cerebral infarction: Secondary | ICD-10-CM | POA: Diagnosis not present

## 2019-02-24 DIAGNOSIS — R0902 Hypoxemia: Secondary | ICD-10-CM | POA: Diagnosis not present

## 2019-02-24 DIAGNOSIS — I69398 Other sequelae of cerebral infarction: Secondary | ICD-10-CM | POA: Diagnosis not present

## 2019-02-24 DIAGNOSIS — Z885 Allergy status to narcotic agent status: Secondary | ICD-10-CM

## 2019-02-24 DIAGNOSIS — I6200 Nontraumatic subdural hemorrhage, unspecified: Secondary | ICD-10-CM

## 2019-02-24 DIAGNOSIS — I071 Rheumatic tricuspid insufficiency: Secondary | ICD-10-CM | POA: Diagnosis not present

## 2019-02-24 DIAGNOSIS — S065X9A Traumatic subdural hemorrhage with loss of consciousness of unspecified duration, initial encounter: Secondary | ICD-10-CM | POA: Diagnosis not present

## 2019-02-24 DIAGNOSIS — K920 Hematemesis: Secondary | ICD-10-CM | POA: Diagnosis not present

## 2019-02-24 DIAGNOSIS — R531 Weakness: Secondary | ICD-10-CM | POA: Diagnosis not present

## 2019-02-24 DIAGNOSIS — Z79899 Other long term (current) drug therapy: Secondary | ICD-10-CM

## 2019-02-24 DIAGNOSIS — Z823 Family history of stroke: Secondary | ICD-10-CM

## 2019-02-24 LAB — CBC
HCT: 24.6 % — ABNORMAL LOW (ref 36.0–46.0)
Hemoglobin: 8.2 g/dL — ABNORMAL LOW (ref 12.0–15.0)
MCH: 29.3 pg (ref 26.0–34.0)
MCHC: 33.3 g/dL (ref 30.0–36.0)
MCV: 87.9 fL (ref 80.0–100.0)
Platelets: 173 10*3/uL (ref 150–400)
RBC: 2.8 MIL/uL — ABNORMAL LOW (ref 3.87–5.11)
RDW: 25.9 % — ABNORMAL HIGH (ref 11.5–15.5)
WBC: 5.6 10*3/uL (ref 4.0–10.5)
nRBC: 0 % (ref 0.0–0.2)

## 2019-02-24 LAB — BASIC METABOLIC PANEL
Anion gap: 10 (ref 5–15)
BUN: 31 mg/dL — ABNORMAL HIGH (ref 8–23)
CO2: 26 mmol/L (ref 22–32)
Calcium: 9.3 mg/dL (ref 8.9–10.3)
Chloride: 100 mmol/L (ref 98–111)
Creatinine, Ser: 1.19 mg/dL — ABNORMAL HIGH (ref 0.44–1.00)
GFR calc Af Amer: 51 mL/min — ABNORMAL LOW (ref >60)
GFR calc non Af Amer: 44 mL/min — ABNORMAL LOW (ref >60)
Glucose, Bld: 97 mg/dL (ref 70–99)
Potassium: 3.5 mmol/L (ref 3.5–5.1)
Sodium: 136 mmol/L (ref 135–145)

## 2019-02-24 LAB — PROTIME-INR
INR: 1.6 — ABNORMAL HIGH (ref 0.8–1.2)
Prothrombin Time: 18.7 seconds — ABNORMAL HIGH (ref 11.4–15.2)

## 2019-02-24 MED ORDER — POLYETHYLENE GLYCOL 3350 17 G PO PACK
17.0000 g | PACK | Freq: Every day | ORAL | Status: DC | PRN
  Administered 2019-02-25: 17 g via ORAL
  Filled 2019-02-24: qty 1

## 2019-02-24 MED ORDER — ASPIRIN EC 81 MG PO TBEC
81.0000 mg | DELAYED_RELEASE_TABLET | Freq: Every day | ORAL | Status: DC
  Administered 2019-02-25 – 2019-03-08 (×12): 81 mg via ORAL
  Filled 2019-02-24 (×12): qty 1

## 2019-02-24 MED ORDER — ASPIRIN EC 81 MG PO TBEC
81.0000 mg | DELAYED_RELEASE_TABLET | Freq: Every day | ORAL | Status: DC
  Administered 2019-02-24: 81 mg via ORAL
  Filled 2019-02-24: qty 1

## 2019-02-24 MED ORDER — GUAIFENESIN 100 MG/5ML PO SOLN: 5.0000 mL | Freq: Four times a day (QID) | ORAL | 0 refills | Status: AC

## 2019-02-24 MED ORDER — PANTOPRAZOLE SODIUM 40 MG IV SOLR
40.0000 mg | Freq: Every day | INTRAVENOUS | Status: DC
  Administered 2019-02-25: 40 mg via INTRAVENOUS
  Filled 2019-02-24: qty 40

## 2019-02-24 MED ORDER — WARFARIN - PHARMACIST DOSING INPATIENT: Freq: Every day | Status: DC

## 2019-02-24 MED ORDER — FUROSEMIDE 80 MG PO TABS: 80.0000 mg | ORAL_TABLET | Freq: Two times a day (BID) | ORAL | Status: DC

## 2019-02-24 MED ORDER — PANTOPRAZOLE SODIUM 40 MG IV SOLR: 40.0000 mg | INTRAVENOUS | Status: DC

## 2019-02-24 MED ORDER — ACETAMINOPHEN 325 MG PO TABS: 325.0000 mg | ORAL_TABLET | ORAL | Status: DC | PRN

## 2019-02-24 MED ORDER — WARFARIN SODIUM 5 MG PO TABS
5.0000 mg | ORAL_TABLET | Freq: Once | ORAL | Status: AC
  Administered 2019-02-24: 5 mg via ORAL
  Filled 2019-02-24: qty 1

## 2019-02-24 MED ORDER — PROCHLORPERAZINE MALEATE 5 MG PO TABS: 5.0000 mg | ORAL_TABLET | Freq: Four times a day (QID) | ORAL | Status: DC | PRN

## 2019-02-24 MED ORDER — NITROGLYCERIN 0.4 MG SL SUBL: 0.4000 mg | SUBLINGUAL_TABLET | SUBLINGUAL | Status: DC | PRN

## 2019-02-24 MED ORDER — GUAIFENESIN-DM 100-10 MG/5ML PO SYRP: 5.0000 mL | ORAL_SOLUTION | Freq: Four times a day (QID) | ORAL | Status: DC | PRN

## 2019-02-24 MED ORDER — DIPHENHYDRAMINE HCL 12.5 MG/5ML PO ELIX: 12.5000 mg | ORAL_SOLUTION | Freq: Four times a day (QID) | ORAL | Status: DC | PRN

## 2019-02-24 MED ORDER — SPIRONOLACTONE 12.5 MG HALF TABLET
12.5000 mg | ORAL_TABLET | Freq: Every day | ORAL | Status: DC
  Administered 2019-02-25 – 2019-03-08 (×11): 12.5 mg via ORAL
  Filled 2019-02-24 (×12): qty 1

## 2019-02-24 MED ORDER — PROCHLORPERAZINE 25 MG RE SUPP: 12.5000 mg | Freq: Four times a day (QID) | RECTAL | Status: DC | PRN

## 2019-02-24 MED ORDER — GUAIFENESIN 100 MG/5ML PO SOLN
5.0000 mL | Freq: Four times a day (QID) | ORAL | Status: DC
  Administered 2019-02-24 – 2019-03-08 (×42): 100 mg via ORAL
  Filled 2019-02-24 (×21): qty 5
  Filled 2019-02-24: qty 10
  Filled 2019-02-24 (×15): qty 5
  Filled 2019-02-24: qty 25
  Filled 2019-02-24 (×5): qty 5

## 2019-02-24 MED ORDER — PROCHLORPERAZINE EDISYLATE 10 MG/2ML IJ SOLN: 5.0000 mg | Freq: Four times a day (QID) | INTRAMUSCULAR | Status: DC | PRN

## 2019-02-24 MED ORDER — FLEET ENEMA 7-19 GM/118ML RE ENEM: 1.0000 | ENEMA | Freq: Once | RECTAL | Status: DC | PRN

## 2019-02-24 MED ORDER — POLYETHYLENE GLYCOL 3350 17 G PO PACK: 17.0000 g | PACK | Freq: Every day | ORAL | Status: DC | PRN

## 2019-02-24 MED ORDER — ALUM & MAG HYDROXIDE-SIMETH 200-200-20 MG/5ML PO SUSP: 30.0000 mL | ORAL | Status: DC | PRN

## 2019-02-24 MED ORDER — BISACODYL 10 MG RE SUPP: 10.0000 mg | Freq: Every day | RECTAL | Status: DC | PRN

## 2019-02-24 MED ORDER — ASPIRIN 81 MG PO TBEC: 81.0000 mg | DELAYED_RELEASE_TABLET | Freq: Every day | ORAL | Status: AC

## 2019-02-24 MED ORDER — TRAZODONE HCL 50 MG PO TABS
25.0000 mg | ORAL_TABLET | Freq: Every evening | ORAL | Status: DC | PRN
  Administered 2019-02-26: 25 mg via ORAL
  Filled 2019-02-24: qty 1

## 2019-02-24 MED ORDER — POLYVINYL ALCOHOL 1.4 % OP SOLN
1.0000 [drp] | OPHTHALMIC | Status: DC | PRN
  Filled 2019-02-24: qty 15

## 2019-02-24 MED ORDER — METOPROLOL TARTRATE 25 MG PO TABS
25.0000 mg | ORAL_TABLET | Freq: Two times a day (BID) | ORAL | Status: DC
  Administered 2019-02-24 – 2019-03-08 (×21): 25 mg via ORAL
  Filled 2019-02-24 (×23): qty 1

## 2019-02-24 MED ORDER — ONDANSETRON HCL 4 MG/2ML IJ SOLN: 4.0000 mg | Freq: Four times a day (QID) | INTRAMUSCULAR | Status: DC | PRN

## 2019-02-24 MED ORDER — ALBUTEROL SULFATE (2.5 MG/3ML) 0.083% IN NEBU
2.5000 mg | INHALATION_SOLUTION | Freq: Four times a day (QID) | RESPIRATORY_TRACT | Status: DC | PRN
  Administered 2019-02-25 – 2019-03-05 (×3): 2.5 mg via RESPIRATORY_TRACT
  Filled 2019-02-24 (×3): qty 3

## 2019-02-24 MED ORDER — FUROSEMIDE 40 MG PO TABS
80.0000 mg | ORAL_TABLET | Freq: Two times a day (BID) | ORAL | Status: DC
  Administered 2019-02-24 – 2019-03-08 (×24): 80 mg via ORAL
  Filled 2019-02-24 (×24): qty 2

## 2019-02-24 MED ORDER — MORPHINE SULFATE (PF) 2 MG/ML IV SOLN: 0.5000 mg | Freq: Four times a day (QID) | INTRAVENOUS | 0 refills | Status: DC | PRN

## 2019-02-24 MED ORDER — WARFARIN SODIUM 5 MG PO TABS: 5.0000 mg | ORAL_TABLET | Freq: Once | ORAL | Status: DC

## 2019-02-24 MED ORDER — ACETAMINOPHEN 325 MG PO TABS
325.0000 mg | ORAL_TABLET | ORAL | Status: DC | PRN
  Administered 2019-02-26 – 2019-03-08 (×24): 650 mg via ORAL
  Filled 2019-02-24 (×23): qty 2
  Filled 2019-02-24: qty 1
  Filled 2019-02-24 (×2): qty 2

## 2019-02-24 MED ORDER — BISACODYL 10 MG RE SUPP
10.0000 mg | Freq: Every day | RECTAL | Status: DC | PRN
  Administered 2019-02-24: 10 mg via RECTAL
  Filled 2019-02-24: qty 1

## 2019-02-24 MED ORDER — WARFARIN - PHARMACIST DOSING INPATIENT
Freq: Every day | Status: DC
  Administered 2019-03-05 – 2019-03-07 (×3)

## 2019-02-24 MED ORDER — DOCUSATE SODIUM 50 MG/5ML PO LIQD: 100.0000 mg | Freq: Two times a day (BID) | ORAL | Status: DC | PRN

## 2019-02-24 NOTE — Progress Notes (Addendum)
   Plan noted for patient to go to CIR. Not clear how long stay will be, so please contact cardiology team at discharge from CIR to arrange follow-up. Dayna Dunn PA-C  [facility] will sign off.   Medication Recommendations:  As per [facility] Other recommendations (labs, testing, etc):  None Follow up as an outpatient:  As above, we would follow in CIR if notified on admission.

## 2019-02-24 NOTE — Care Management Important Message (Signed)
Important Message  Patient Details  Name: Carrie [name] MRN: [id number] Date of Birth: November 23, 1940   Medicare Important Message Given:  Yes    [name] 02/24/2019, 2:27 PM

## 2019-02-24 NOTE — Progress Notes (Signed)
Physical Therapy Treatment Patient Details Name: Carrie [name] MRN: [id number] DOB: 1940/10/03 Today's Date: 02/24/2019    History of Present Illness [name] who presented to [facility] 5/29 with left sided weakness, facial droop and slurred speech. 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 . The patient was taken to interventional radiology per Dr. 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 The patient was returned to ICU on mechanical ventilation, extubated 5/31. 6/3 CT revealed new acute posterior fossa hemorrhage.     PT Comments    Pt progressing towards physical therapy goals. She was able to tolerate increased functional activity today and ambulated ~150 feet around the nurses' station with RW for support and chair follow for safety. Pt required min-mod assist for balance support during OOB mobility, with 1 instance of max assist required at the sink as pt was attempting to fix hair with BUE's. CIR remains appropriate. Will continue to follow and progress as able per POC.     Follow Up Recommendations  CIR;Supervision/Assistance - 24 hour     Equipment Recommendations  None recommended by PT    Recommendations for Other Services Rehab consult     Precautions / Restrictions Precautions Precautions: Fall Precaution Comments: bil LE edema Restrictions Weight Bearing Restrictions: No    Mobility  Bed Mobility Overal bed mobility: Needs Assistance Bed Mobility: Supine to Sit     Supine to sit: Min assist     General bed mobility comments: increased time and effort, assist to pivot hips to full EOB position.  Transfers Overall transfer level: Needs assistance Equipment used: Rolling walker (2 wheeled) Transfers: Sit to/from [facility] Sit to Stand: Min assist         General transfer comment:  VC's for hand placement on seated surface for safety. Increased time to power-up to full stand.   Ambulation/Gait Ambulation/Gait assistance: Min assist;Mod assist Gait Distance (Feet): 150 Feet Assistive device: Rolling walker (2 wheeled) Gait Pattern/deviations: Step-through pattern;Trunk flexed;Narrow base of support;Decreased step length - right;Decreased step length - left;Shuffle Gait velocity: Very slow Gait velocity interpretation: <1.31 ft/sec, indicative of household ambulator General Gait Details: Min-mod assist required for balance support with RW. As pt progressed with gait training, more consistently requiring min assist.    Stairs             Wheelchair Mobility    Modified Rankin (Stroke Patients Only) Modified Rankin (Stroke Patients Only) Pre-Morbid Rankin Score: Slight disability Modified Rankin: Moderately severe disability     Balance Overall balance assessment: Needs assistance Sitting-balance support: Bilateral upper extremity supported;Feet supported Sitting balance-Leahy Scale: Fair     Standing balance support: No upper extremity supported;During functional activity Standing balance-Leahy Scale: Poor Standing balance comment: 1 instance of max assist required as pt was standing at the sink and attempting to fix her hair.                             Cognition Arousal/Alertness: Awake/alert Behavior During Therapy: Flat affect Overall Cognitive Status: No family/caregiver present to determine baseline cognitive functioning Area of Impairment: Following commands;Safety/judgement;Awareness;Problem solving                       Following Commands: Follows one step commands consistently;Follows one step commands with increased  time;Follows multi-step commands inconsistently Safety/Judgement: Decreased awareness of safety Awareness: Emergent Problem Solving: Slow processing;Decreased initiation;Difficulty sequencing;Requires  verbal cues General Comments: Pleasant - increased time to respond at times      Exercises      General Comments        Pertinent Vitals/Pain Pain Assessment: No/denies pain Pain Intervention(s): Monitored during session    Home Living                      Prior Function            PT Goals (current goals can now be found in the care plan section) Acute Rehab PT Goals Patient Stated Goal: to get better PT Goal Formulation: With patient Time For Goal Achievement: 03/05/19 Potential to Achieve Goals: Good Progress towards PT goals: Progressing toward goals    Frequency    Min 4X/week      PT Plan Current plan remains appropriate    Co-evaluation              AM-PAC PT "6 Clicks" Mobility   Outcome Measure  Help needed turning from your back to your side while in a flat bed without using bedrails?: A Little Help needed moving from lying on your back to sitting on the side of a flat bed without using bedrails?: A Little Help needed moving to and from a bed to a chair (including a wheelchair)?: A Lot Help needed standing up from a chair using your arms (e.g., wheelchair or bedside chair)?: A Little Help needed to walk in hospital room?: A Lot Help needed climbing 3-5 steps with a railing? : Total 6 Click Score: 14    End of Session Equipment Utilized During Treatment: Gait belt Activity Tolerance: Patient tolerated treatment well Patient left: in bed;with call bell/phone within reach;with bed alarm set Nurse Communication: Mobility status PT Visit Diagnosis: Unsteadiness on feet (R26.81);Other abnormalities of gait and mobility (R26.89);Muscle weakness (generalized) (M62.81)     Time: 0786-7544 PT Time Calculation (min) (ACUTE ONLY): 33 min  Charges:  $Gait Training: 23-37 mins                     Carrie [name], PT, DPT Acute Rehabilitation Services Pager: [phone] Office: [phone]    Thelma Comp 02/24/2019, 11:20 AM

## 2019-02-24 NOTE — Progress Notes (Signed)
Carrie [name] for warfarin Indication: atrial fibrillation and mechanical valve  Allergies  Allergen Reactions  . Codeine Nausea And Vomiting    Patient Measurements: Height: 5\' 1"  (154.9 cm) Weight: 129 lb 10.1 oz (58.8 kg) IBW/kg (Calculated) : 47.8 Heparin Dosing Weight: 59kg  Vital Signs: Temp: 98 F (36.7 C) (06/05 0746) Temp Source: Oral (06/05 0746) BP: 104/64 (06/05 0814) Pulse Rate: 81 (06/05 0814)  Labs: Recent Labs    02/22/19 0319 02/23/19 0539 02/24/19 0338  HGB 8.5* 8.5* 8.2*  HCT 25.6* 25.4* 24.6*  PLT 163 176 173  LABPROT 18.1* 18.5* 18.7*  INR 1.5* 1.6* 1.6*  HEPARINUNFRC 0.37  --   --   CREATININE 1.16* 1.21* 1.19*    Estimated Creatinine Clearance: 32.6 mL/min (A) (by C-G formula based on SCr of 1.19 mg/dL (H)).  Assessment: 17 yof s/p MCA M1 occlusion s/p IR revascularization. She is on chronic warfarin for afib and mechanical valve. INR on admission was subtherapeutic at 1.9 so she was initiated on IV heparin as a bridge back to therapeutic warfarin. Unfortunately she developed an SDH and SAH. Heparin was reversed and warfarin held.   To resume warfarin today with new INR goal of 2-2.5.   Goal of Therapy:  INR 2-2.5 Monitor platelets by anticoagulation protocol: Yes   Plan:  Warfarin 5mg  PO x 1 tonight Daily INR *Anticipate discharge warfarin regimen will need to be adjusted to accommodate new INR goal  Carrie [name], PharmD, BCPS Please see AMION for all pharmacy numbers 02/24/2019 9:23 AM

## 2019-02-24 NOTE — H&P (Signed)
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  History taken from chart review and patient.  CTA head/neck done revealing acute occlusion of right M1 brach with delayed collateral flow and marked enlargement of superior bilateral ophthalmic vein.  INR subtherapeutic at admission and she underwent cerebral angio with complete revascularization of R-MCA M1 segment with TICI revascularization. Dr. Erlinda Hong felt that stroke was cardioembolic due to subtherapeutic INR in setting of A fib/mechanical valve. She was started on IV heparin and coumadin 5/31.  She has had issue with intermittent coughing and developed diffuse wheezing.  CXR negative--continues on supplemental oxygen. Cardiology consulted for input due to reports of progressive SOB for 2-3 weeks PTA and felt that symptoms multifactorial she was briefly treated with IV diuretics.  Hospital course further complicated by acute on chronic anemia, with steady decline. She developed N/V with hematemesis and H/A on 6/3.  Stat CT head reviewed, showing posterior right SDH.  Per report, right posterior fossa SDH with paramedian SAH and heparin reversed with protamine and Coumadin held.  Dr. Vaughan Browner felt that hemoptysis likely due to pneumonitis from bleeding and anticoagulation. He  questioned development of interstitial lung disease due to amiodarone leading to SOB. Her symptoms are improving and he recommends chest CT if hemoptysis worsens.  CT head reviewed, showing improvement.  Coumadin/ASA to be resumed with INR goal 2.0-2.5.  GI symptoms and HA have  resolved but she continues to have balance deficits and weakness affecting ADLs and mobility. CIR recommended due to functional decline.      Review of Systems  Constitutional: Negative for chills and fever.  HENT: Negative for hearing loss and tinnitus.   Eyes: Negative for blurred vision and double vision.  Respiratory: Negative for cough and shortness of breath.   Cardiovascular: Negative for chest pain, palpitations and leg swelling.  Gastrointestinal: Negative for heartburn and nausea.  Genitourinary: Negative for dysuria.  Musculoskeletal: Positive for joint pain (right knee). Negative for myalgias.  Skin: Negative for itching and rash.  Neurological: Positive for focal weakness. Negative for dizziness and headaches.  Psychiatric/Behavioral: The patient has insomnia. The patient is not nervous/anxious.   All other systems reviewed and are negative.  Past Medical History:  Diagnosis Date   Anticoagulated on Coumadin    for mech valve and atrial fib  goal 2.0-2.5   Anxiety    Arthritis    "right shoulder" (03/15/2013)   Atrial fibrillation (HCC)    Atrial flutter (HCC)    CAD (coronary artery disease) 08/11/2016   Chronic combined systolic and diastolic CHF (congestive heart failure) ([place])    a. 02/2016 Echo: EF 45-50%.   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 Bi-leaflet St. Jude mechanical prosthesis; b. 02/2016 Echo: EF 45-50%, some degree of MR, sev dil LA/RA, sev TR.   NSTEMI (non-ST elevated myocardial infarction) ([place])    a. 02/2016 elev trop/Cath:  nonobs dzs,    Pacemaker    medtronic adapta   Severe tricuspid regurgitation    a. 02/2016 Echo: Ef 45-50%, sev TR, PASP 79mmHg.   Sick sinus syndrome (HCC)    Dr Cristopher Peru. EP study negative. Pacemaker 12/15/06  Medtronic   Stroke [facility])    "they say I had a stroke last year" denies residual on 03/15/2013   Subdural hematoma [facility])     Past Surgical History:  Procedure Laterality Date   APPENDECTOMY     CARDIAC CATHETERIZATION  04/02/97   R&L:severe MR/pulmonary hypertension   CARDIAC CATHETERIZATION N/A 03/16/2016   Procedure: Left Heart Cath and Coronary Angiography;  Surgeon: [name], MD;  Location: [place] [facility];  Service: Cardiovascular;  Laterality: N/A;   CARDIAC CATHETERIZATION N/A 09/30/2016   Procedure: Left Heart Cath and Coronary Angiography;  Surgeon: [name], MD;  Location: [place] [facility];  Service: Cardiovascular;  Laterality: N/A;   CARDIAC CATHETERIZATION N/A 09/30/2016   Procedure: Coronary Stent Intervention;  Surgeon: [name], MD;  3.5 x 12 mm resolute Onyx  to ostial RCA   CATARACT EXTRACTION W/ INTRAOCULAR LENS  IMPLANT, BILATERAL Bilateral 2013   CORONARY ANGIOPLASTY     DILATION AND CURETTAGE OF UTERUS     "had fibroids" (03/15/2013)   EXPLORATORY LAPAROTOMY     "had a growth on my intestines" (03/15/2013)   HAMMER TOE SURGERY Right    INSERT / REPLACE / REMOVE PACEMAKER  12/15/2006   Medtronic   IR ANGIO VERTEBRAL SEL SUBCLAVIAN INNOMINATE UNI R MOD SED  02/17/2019   IR CT HEAD LTD  02/17/2019   IR PERCUTANEOUS ART THROMBECTOMY/INFUSION INTRACRANIAL INC DIAG ANGIO  02/17/2019   MITRAL VALVE REPLACEMENT  1998   St Jude mechanical; Dr. [name]   PACEMAKER PLACEMENT  12/15/06   medtronic adapta for SSS   PERMANENT PACEMAKER GENERATOR CHANGE N/A 07/24/2014   Procedure: PERMANENT PACEMAKER GENERATOR CHANGE;  Surgeon: [name], MD;  Location: [place] CATH LAB;  Service: Cardiovascular;  Laterality: N/A;   PERSANTINE CARDIOLITE  08/07/03   mild inf. ischemia    RADIOLOGY WITH ANESTHESIA N/A 02/17/2019   Procedure: IR WITH ANESTHESIA;  Surgeon: [name], MD;  Location: [place];  Service: Radiology;  Laterality: N/A;     RIGHT HEART CATH N/A 02/06/2019   Procedure: RIGHT HEART CATH;  Surgeon: [name], MD;  Location: [place] [facility];  Service: Cardiovascular;  Laterality: N/A;   TEE WITHOUT CARDIOVERSION  09/23/2011   Procedure: TRANSESOPHAGEAL ECHOCARDIOGRAM (TEE);  Surgeon: [name];  Location: [facility];  Service: Cardiovascular;  Laterality: N/A;   TONSILLECTOMY     TUBAL LIGATION     US ECHOCARDIOGRAPHY  11/19/2011   EF 50-55%,RA mod to severely dilated,LA severely dilated,trace MR,small vegetation or mass on the MV,AOV mildly scleroticmild PI, RV pressure 40-93mmHg    Family History  Problem Relation Age of Onset   Cancer Mother    Stroke Father    Cancer Sister    Leukemia Sister    Healthy Brother    Healthy Sister    Diabetes Sister    Healthy Brother    Healthy Brother    Heart attack Maternal Grandfather    Kidney disease Daughter    Stroke Brother     Social History:  Married. Husband with recent diagnosis of bladder cancer. Used to work for [employer]. She reports that she has never smoked. She has never used smokeless tobacco. She  reports that she does not drink alcohol or use drugs.    Allergies  Allergen Reactions   Codeine Nausea And Vomiting    Medications Prior to Admission  Medication Sig Dispense Refill   atorvastatin (LIPITOR) 40 MG tablet TAKE 1 TABLET BY MOUTH EVERY DAY AT 6PM (Patient taking differently: Take 40 mg by mouth daily at 6 PM. ) 30 tablet 6   Biotin 1000 MCG tablet Take 1,000 mcg by mouth daily.     dextromethorphan-guaiFENesin (MUCINEX DM) 30-600 MG 12hr tablet Take 1 tablet by mouth 2 (two) times daily as needed for cough. 20 tablet 0   docusate sodium (COLACE) 100 MG capsule Take 200 mg by mouth 2 (two) times daily.      furosemide (LASIX) 80 MG tablet Take 1 tablet (80 mg total) by mouth 2 (two) times daily. 60 tablet 0   metolazone (ZAROXOLYN) 2.5 MG tablet Take 2.5 mg when your weight is above 137 lbs  as recommended by cardiology. (Patient taking differently: Take 2.5 mg by mouth as needed. Take 2.5 mg when your weight is above 137 lbs as recommended by cardiology.) 10 tablet 0   metoprolol tartrate (LOPRESSOR) 25 MG tablet Take 1 tablet (25 mg total) by mouth 2 (two) times daily. 180 tablet 3   Multiple Vitamin (MULTIVITAMINS PO) Take 1 tablet by mouth daily.      nitroGLYCERIN (NITROSTAT) 0.4 MG SL tablet Place 1 tablet (0.4 mg total) under the tongue every 5 (five) minutes as needed for chest pain. 225 tablet 0   Omega-3 Fatty Acids (FISH OIL) 1000 MG CAPS Take 1,000 mg by mouth daily.     polyethylene glycol (MIRALAX / GLYCOLAX) packet Take 17 g by mouth as needed (for constipation). Reported on 03/25/2016     polyvinyl alcohol (LIQUIFILM TEARS) 1.4 % ophthalmic solution Place 1 drop into the left eye as needed for dry eyes. 15 mL 0   potassium chloride SA (K-DUR,KLOR-CON) 20 MEQ tablet Take 2 tablets (40 mEq total) by mouth every morning AND 1 tablet (20 mEq total) every evening. 270 tablet 3   spironolactone (ALDACTONE) 25 MG tablet Take 0.5 tablets (12.5 mg total) by mouth daily. 30 tablet 0   [DISCONTINUED] warfarin (COUMADIN) 5 MG tablet TAKE 1 TO 1 AND 1/2 TABLETS BY MOUTH DAILY AS DIRECTED (Patient taking differently: Take 2.5-5 mg by mouth See admin instructions. Taking 1/2 tablet (2.5) mg on Monday and Thursday. All other days 5mg  tablet) 120 tablet 1    Drug Regimen Review  Drug regimen was reviewed and remains appropriate with no significant issues identified  Home: Home Living Family/patient expects to be discharged to:: Private residence Living Arrangements: Spouse/significant other Available Help at Discharge: Family, Available 24 hours/day Type of Home: House Home Access: Stairs to enter Technical brewer of Steps: 1 Entrance Stairs-Rails: None Home Layout: Multi-level Alternate Level Stairs-Number of Steps: 7 then 7 Alternate Level Stairs-Rails:  Right Bathroom Shower/Tub: Chiropodist: Handicapped height Home Equipment: [place] - single point, Environmental consultant - 4 wheels, Bedside commode, Grab bars - tub/shower  Lives With: Spouse   Functional History: Prior Function Level of Independence: Needs assistance Gait / Transfers Assistance Needed: use of SPC for mobility ADL's / Homemaking Assistance Needed: pt reports spouse assist with LB ADLS (compression hose/shoes), but she was able to complete all other self care without assist; basin bathes only Comments: History of a fall down her steps one month ago  Functional Status:  Mobility: Bed Mobility Overal bed  mobility: Needs Assistance Bed Mobility: Supine to Sit Supine to sit: Mod assist General bed mobility comments: increased time and effort, assist to pivot hips to full EOB position. Transfers Overall transfer level: Needs assistance Equipment used: Rolling walker (2 wheeled) Transfers: Sit to/from Stand, [employer] Transfers Sit to Stand: Mod assist Stand pivot transfers: Mod assist, +2 safety/equipment General transfer comment: Assist to powerup. Cues for sequencing and assist to steady during pivoting. Cues for hand placement. Increased time required throughout.  Ambulation/Gait Ambulation/Gait assistance: Min assist Gait Distance (Feet): 25 Feet Assistive device: Rolling walker (2 wheeled) Gait Pattern/deviations: Step-through pattern, Trunk flexed, Narrow base of support, Decreased step length - right, Decreased step length - left, Shuffle General Gait Details: Gait training deferred this session as pt with 9/10 headache with supine>sit and sit>stand. Pt reports improvement with sitting in chair.  Gait velocity: Very slow Gait velocity interpretation: <1.31 ft/sec, indicative of household ambulator    ADL: ADL Overall ADL's : Needs assistance/impaired Grooming: Set up, Wash/dry face Upper Body Bathing: Set up, Sitting Lower Body Bathing: Moderate  assistance, Sit to/from stand Upper Body Dressing : Set up, Sitting Lower Body Dressing: Maximal assistance, Sit to/from stand Toilet Transfer: Moderate assistance, +2 for safety/equipment, Stand-pivot, BSC, RW Toilet Transfer Details (indicate cue type and reason): simulated with EOB>recliner. Increased time and effort. Cues for sequencing movements Toileting- Clothing Manipulation and Hygiene: Moderate assistance, Sit to/from stand Toileting - Clothing Manipulation Details (indicate cue type and reason): pt incontient of bladder during mobility to restroom, mod assist for clothing mgmt/hygeine upon reaching commode Functional mobility during ADLs: Rolling walker, Minimal assistance General ADL Comments: Pt completed grooming task in bed, bed mobility and SPT to recliner.  Cognition: Cognition Overall Cognitive Status: No family/caregiver present to determine baseline cognitive functioning Arousal/Alertness: Awake/alert Orientation Level: Oriented X4 Attention: Sustained, Focused Focused Attention: Appears intact Sustained Attention: Appears intact Memory: Impaired Memory Impairment: Retrieval deficit, Decreased recall of new information(Immediate: 3/3; Delayed: 1/3 with cues: 2/2) Awareness: Appears intact Problem Solving: Appears intact(5/5) Executive Function: Reasoning, Sequencing Reasoning: Impaired Reasoning Impairment: Verbal complex(Abstraction: 1/3; with cues: 2/2) Cognition Arousal/Alertness: Awake/alert Behavior During Therapy: Flat affect Overall Cognitive Status: No family/caregiver present to determine baseline cognitive functioning Area of Impairment: Following commands, Safety/judgement, Awareness, Problem solving Following Commands: Follows one step commands consistently, Follows one step commands with increased time, Follows multi-step commands inconsistently Safety/Judgement: Decreased awareness of safety Awareness: Emergent Problem Solving: Slow processing,  Decreased initiation, Difficulty sequencing, Requires verbal cues General Comments: minimal verbalizations, pleasant   Blood pressure 107/65, pulse (!) 52, temperature 98 F (36.7 C), temperature source Oral, resp. rate (!) 26, height 5\' 1"  (1.549 m), weight 58.8 kg, SpO2 95 %. Physical Exam  Nursing note and vitals reviewed. Constitutional: She is oriented to person, place, and time. She appears well-developed and well-nourished.  HENT:  Head: Normocephalic and atraumatic.  Eyes: EOM are normal. Right eye exhibits no discharge. Left eye exhibits no discharge.  Respiratory: Effort normal. No respiratory distress. She has no wheezes. She exhibits no tenderness.  + [place]  GI: She exhibits no distension.  Musculoskeletal:        General: No edema.     Comments: No edema or tenderness in extremities  Neurological: She is alert and oriented to person, place, and time.  Up in chair leaning to the right.  Speech clear.  Alert oriented x2 Able to follow one and two step commands with increased time Motor: Grossly 4+-5/5 throughout  Skin: Skin is warm  and dry.  Psychiatric: Her affect is blunt. Her speech is delayed and tangential. She is slowed.    Results for orders placed or performed during the hospital encounter of 02/17/19 (from the past 48 hour(s))  Glucose, capillary     Status: Abnormal   Collection Time: 02/22/19  4:23 PM  Result Value Ref Range   Glucose-Capillary 121 (H) 70 - 99 mg/dL  Protime-INR     Status: Abnormal   Collection Time: 02/23/19  5:39 AM  Result Value Ref Range   Prothrombin Time 18.5 (H) 11.4 - 15.2 seconds   INR 1.6 (H) 0.8 - 1.2    Comment: (NOTE) INR goal varies based on device and disease states. Performed at [facility], [place] [address] [place], [place] [address]   Basic metabolic panel     Status: Abnormal   Collection Time: 02/23/19  5:39 AM  Result Value Ref Range   Sodium 135 135 - 145 mmol/L   Potassium 3.9 3.5 - 5.1 mmol/L     Comment: NO VISIBLE HEMOLYSIS   Chloride 101 98 - 111 mmol/L   CO2 24 22 - 32 mmol/L   Glucose, Bld 95 70 - 99 mg/dL   BUN 32 (H) 8 - 23 mg/dL   Creatinine, Ser 1.21 (H) 0.44 - 1.00 mg/dL   Calcium 9.4 8.9 - 10.3 mg/dL   GFR calc non Af Amer 43 (L) >60 mL/min   GFR calc Af Amer 50 (L) >60 mL/min   Anion gap 10 5 - 15    Comment: Performed at [facility], [place] [address] [place], [place] [address]  CBC     Status: Abnormal   Collection Time: 02/23/19  5:39 AM  Result Value Ref Range   WBC 5.2 4.0 - 10.5 K/uL   RBC 2.86 (L) 3.87 - 5.11 MIL/uL   Hemoglobin 8.5 (L) 12.0 - 15.0 g/dL   HCT 25.4 (L) 36.0 - 46.0 %   MCV 88.8 80.0 - 100.0 fL   MCH 29.7 26.0 - 34.0 pg   MCHC 33.5 30.0 - 36.0 g/dL   RDW 25.0 (H) 11.5 - 15.5 %   Platelets 176 150 - 400 K/uL    Comment: REPEATED TO VERIFY   nRBC 0.4 (H) 0.0 - 0.2 %    Comment: Performed at [facility], [place] [address], [place], [place] [address]  Protime-INR     Status: Abnormal   Collection Time: 02/24/19  3:38 AM  Result Value Ref Range   Prothrombin Time 18.7 (H) 11.4 - 15.2 seconds   INR 1.6 (H) 0.8 - 1.2    Comment: (NOTE) INR goal varies based on device and disease states. Performed at [facility], [place] [address] [place], [place] [address]   Basic metabolic panel     Status: Abnormal   Collection Time: 02/24/19  3:38 AM  Result Value Ref Range   Sodium 136 135 - 145 mmol/L   Potassium 3.5 3.5 - 5.1 mmol/L   Chloride 100 98 - 111 mmol/L   CO2 26 22 - 32 mmol/L   Glucose, Bld 97 70 - 99 mg/dL   BUN 31 (H) 8 - 23 mg/dL   Creatinine, Ser 1.19 (H) 0.44 - 1.00 mg/dL   Calcium 9.3 8.9 - 10.3 mg/dL   GFR calc non Af Amer 44 (L) >60 mL/min   GFR calc Af Amer 51 (L) >60 mL/min   Anion gap 10 5 - 15  Comment: Performed at [facility], [place] [address] [place], [place] [address]  CBC     Status: Abnormal   Collection Time: 02/24/19  3:38 AM  Result Value Ref Range   WBC 5.6 4.0 - 10.5 K/uL   RBC 2.80  (L) 3.87 - 5.11 MIL/uL   Hemoglobin 8.2 (L) 12.0 - 15.0 g/dL   HCT 24.6 (L) 36.0 - 46.0 %   MCV 87.9 80.0 - 100.0 fL   MCH 29.3 26.0 - 34.0 pg   MCHC 33.3 30.0 - 36.0 g/dL   RDW 25.9 (H) 11.5 - 15.5 %   Platelets 173 150 - 400 K/uL    Comment: REPEATED TO VERIFY PLATELET COUNT CONFIRMED BY SMEAR    nRBC 0.0 0.0 - 0.2 %    Comment: Performed at [facility], [place] [address], [place], [place] [address]   Ct Head Wo Contrast  Result Date: 02/22/2019 CLINICAL DATA:  Subdural hemorrhage follow-up. EXAM: CT HEAD WITHOUT CONTRAST TECHNIQUE: Contiguous axial images were obtained from the base of the skull through the vertex without intravenous contrast. COMPARISON:  02/22/2019. FINDINGS: Brain: Unchanged, and unusual RIGHT posterior fossa extra-axial hematoma, favored to be subdural, with hematocrit level of dense red cells above which is layering supernatant. Overall thickness is no worse, may be minimally improved, 10-12 mm. There is still mass effect on the fourth ventricle with RIGHT-to-LEFT shift, which remains concerning. In the prepontine cistern, subarachnoid blood is observed, also no worse. No parenchymal hemorrhage or hydrocephalus. Atrophy and small vessel disease. Vascular: Calcification of the cavernous internal carotid arteries consistent with cerebrovascular atherosclerotic disease. No signs of intracranial large vessel occlusion. Skull: Surprisingly, no visible skull fracture or sutural diastasis. No pneumocephalus. Sinuses/Orbits: Unchanged sinuses. BILATERAL orbital varices. Other: None. IMPRESSION: Stable to slightly improved RIGHT posterior fossa extra-axial hematoma, favored to be subdural. Continued surveillance is warranted. Electronically Signed   By: Staci Righter M.D.   On: 02/22/2019 18:23   Ct Head Wo Contrast  Result Date: 02/22/2019 CLINICAL DATA:  Stroke follow-up EXAM: CT HEAD WITHOUT CONTRAST TECHNIQUE: Contiguous axial images were obtained from the base of the skull  through the vertex without intravenous contrast. COMPARISON:  02/18/2019 FINDINGS: Brain: Interval posterior fossa hemorrhage with hematocrit level along the lateral right cerebellum measuring up to 14 mm in thickness. This collection is favored subdural in location, but there is also hemorrhage in the prepontine cistern that appears to be more subarachnoid. There is mass effect on the right cerebellum with fourth ventricular distortion but no hydrocephalus. Chronic small vessel ischemic injury to the supratentorial white matter and caudate nuclei. Vascular: No hyperdense vessel. Skull: Negative Sinuses/Orbits: Bilateral cataract resection. 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 Jugular veins are dilated on prior CTA. There is history of valvular cardiac disease which is the presumed cause. Other: Critical Value/emergent results were called by telephone at the time of interpretation on 02/22/2019 at 11:42 am to Dr. Rosalin Hawking , who verbally acknowledged these results. IMPRESSION: Unusual acute posterior fossa hemorrhage. In the right posterior fossa subdural location is favored and in the prepontine cistern clot appears subarachnoid. There is cerebellar mass effect with patent fourth ventricle. Electronically Signed   By: Monte Fantasia M.D.   On: 02/22/2019 11:50   Dg Chest Port 1 View  Result Date: 02/23/2019 CLINICAL DATA:  Shortness of breath EXAM: PORTABLE CHEST 1 VIEW COMPARISON:  02/22/2019 FINDINGS: Cardiac shadow is enlarged but stable. Pacemaker is again  noted and stable. Small enlarging right pleural effusion is seen. Mild vascular congestion is noted slightly increased from the prior exam. No bony abnormality is noted. IMPRESSION: Mild vascular congestion and small right pleural effusion. Electronically Signed   By: Inez Catalina M.D.   On: 02/23/2019 09:49   Dg Chest Port 1 View  Result Date: 02/22/2019 CLINICAL DATA:  78 year old female with hemoptysis,  aspiration. EXAM: PORTABLE CHEST 1 VIEW COMPARISON:  0449 hours today and earlier. FINDINGS: Portable AP semi upright view at 1444 hours. Stable cardiomegaly and mediastinal contours. Stable left chest single lead cardiac pacemaker. Lung volumes and ventilation are stable since 02/17/2019. Cardiomegaly with possible small right pleural effusion. No overt edema. No pneumothorax. No new pulmonary opacity. Paucity of bowel gas in the upper abdomen. IMPRESSION: Stable ventilation since 02/17/2019. No new cardiopulmonary abnormality. Electronically Signed   By: Genevie Ann M.D.   On: 02/22/2019 14:58       Medical Problem List and Plan: 1.  Balance deficits and weakness affecting ADLs and mobility  secondary to cardioembolic stroke complicated by SDH/SAH  Admit to CIR 2.  St Jude's Mitral valve/A fib/ Antithrombotics: -DVT/anticoagulation:  Pharmaceutical: Coumadin   INR subtherapeutic at present  -antiplatelet therapy: low dose ASA 3. Pain Management: tylenol prn.  4. Mood: LCSW to follow for evaluation and support.  Team to provide ego support.   -antipsychotic agents: NA 5. Neuropsych: This patient is not fully capable of making decisions on her own behalf. 6. Skin/Wound Care: routine pressure relief measures.  7. Fluids/Electrolytes/Nutrition: Monitor I/O. Intake improving.  8. CAD/Chronic diastolic CHF: Heart Healthy diet. SOB improving.  Strict I/O's. Continue lasix bid, spironolactone, lopressor with low dose ASA. Filed Weights   02/17/19 1100 02/24/19 0500  Weight: 59 kg 58.8 kg  9. Pneumonitis with hematemesis/Hypoxia: Continue IV protonix. Will monitor for now.   Wean supplemental oxygen as tolerated 10. Chronic A fib/ SSS s/p PPM: Monitor HR tid--continue amiodarone. On coumdin.  Monitor with increased mobility 11. Acute on chronic anemia?:  Check iron panel.  Follow with serial checks. Transfuse if Hgb < 7.0.  Add iron supplement.  12. CKD: Baseline SCr-1.3? Encourage fluid intake.   BMP ordered for Monday. 13. Sleep disturbance:  Add trazodone prn.    Bary Leriche, PA-C 02/24/2019

## 2019-02-24 NOTE — PMR Pre-admission (Signed)
PMR Admission Coordinator Pre-Admission Assessment  Patient: Carrie [name] is an 78 y.o., female MRN: [id number] DOB: 03/17/1941 Height: 5' 1"  (154.9 cm) Weight: 58.8 kg  Insurance Information HMO: yes    PPO:      PCP:      IPA:      80/20:      OTHER:  PRIMARY: [employer] Medicare      Policy#: [id number]      Subscriber: patient CM Name: Philippa Chester       Phone#:      Fax#:  Pre-Cert#: [id number], [place] for CIR admission provided by [facility] on 6/3 with updates due to Dorthula Nettles 7 days from admission (6/11) at [phone]      Employer: n/a Benefits:  Phone #: [phone]     Name:  Eff. Date: 09/21/2018     Deduct: $0      Out of Pocket Max: $3600 (met $62.14)      Life Max: n/a CIR: $295/day for days 1-5      SNF: 20 full days Outpatient: $30/visit     Co-Pay:  Home Health: 100      Co-Pay:  DME: 80%     Co-Pay: 20% Providers: in network  SECONDARY:       Policy#:       Subscriber:  CM Name:       Phone#:      Fax#:  Pre-Cert#:       Employer:  Benefits:  Phone #:      Name:  Eff. Date:      Deduct:       Out of Pocket Max:       Life Max:  CIR:       SNF:  Outpatient:      Co-Pay:  Home Health:       Co-Pay:  DME:      Co-Pay:   Medicaid Application Date:       Case Manager:  Disability Application Date:       Case Worker:   The "Data Collection Information Summary" for patients in Inpatient Rehabilitation Facilities with attached "Privacy Act [place] Records" was provided and verbally reviewed with: Patient and Family  Emergency Contact Information Contact Information    Name Relation Home Work [place] [phone]  [phone]   Malena Peer Sister   [place] Relative   [phone]      Current Medical History  Patient Admitting Diagnosis: R MCA CVA, posterior SDH/SAH  History of Present Illness: Pt is a 78 y/o female with PMH of afib (mechanical heart valve and warfarin therapy), pacemaker, prior SDH, CAD, CHF, HTN,  NSTEMI in 2017, and prior 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  She did not receive tPA due to wafarin therapy.  CTA revealed right M1 which was re-vascularized with IR.  Repeat head CTs on 5/29 and 5/30 stable.  2D echo and dopplers negative.  On 6/3 pt developed worsening mental status/weakness with + hemoptysis, stat CT revealed acute R posterior fossa SDH and pre-pontine SAH.  Heparin and coumadin reversed.  Pt repeat CT on 6/5 showed nearly resolved SDH/SAH.  Pharmacy consulted for resumption of coumadin without heparin. Therapy evaluations completed and recommended CIR.     Complete NIHSS TOTAL: 0  Patient's medical record from [facility] has been reviewed by the rehabilitation admission coordinator and physician.  Past Medical History  Past Medical History:  Diagnosis Date  . Anticoagulated on Coumadin    for mech valve and atrial fib  goal 2.0-2.5  . Anxiety   . Arthritis    "right shoulder" (03/15/2013)  . Atrial fibrillation ([place])   . Atrial flutter ([place])   . CAD (coronary artery disease) 08/11/2016  . Chronic combined systolic and diastolic CHF (congestive heart failure) ([place])    a. 02/2016 Echo: EF 45-50%.  . Diverticula of colon   . GERD (gastroesophageal reflux disease)   . Hemorrhage intraabdominal 03/17/2012  . Hyperlipidemia   . Hypertension   . Liver hemorrhage   . Migraines   . 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 Bi-leaflet St. Jude mechanical prosthesis; b. 02/2016 Echo: EF 45-50%, some degree of MR, sev dil LA/RA, sev TR.  . NSTEMI (non-ST elevated myocardial infarction) ([place])    a. 02/2016 elev trop/Cath: nonobs dzs,   . Pacemaker    medtronic adapta  . Severe tricuspid regurgitation    a. 02/2016 Echo: Ef 45-50%, sev TR, PASP 53mHg.  . Sick sinus syndrome ([facility]    Dr GCristopher Peru EP study negative. Pacemaker 12/15/06 Medtronic  . Stroke ([facility]    "they say I had a stroke last year" denies  residual on 03/15/2013  . Subdural hematoma (HCC)     Family History   family history includes Cancer in her mother and sister; Diabetes in her sister; Healthy in her brother, brother, brother, and sister; Heart attack in her maternal grandfather; Kidney disease in her daughter; Leukemia in her sister; Stroke in her brother and father.  Prior Rehab/Hospitalizations Has the patient had prior rehab or hospitalizations prior to admission? Yes  Has the patient had major surgery during 100 days prior to admission? Yes   Current Medications  Current Facility-Administered Medications:  .  acetaminophen (TYLENOL) tablet 650 mg, 650 mg, Oral, Q4H PRN **OR** [DISCONTINUED] acetaminophen (TYLENOL) solution 650 mg, 650 mg, Per Tube, Q4H PRN **OR** acetaminophen (TYLENOL) suppository 650 mg, 650 mg, Rectal, Q4H PRN, LKerney Elbe MD .  albuterol (PROVENTIL) (2.5 MG/3ML) 0.083% nebulizer solution 2.5 mg, 2.5 mg, Nebulization, Q6H PRN, GMagdalen Spatz NP, 2.5 mg at 02/22/19 0557 .  aspirin EC tablet 81 mg, 81 mg, Oral, Daily, XRosalin Hawking MD, 81 mg at 02/24/19 1050 .  bisacodyl (DULCOLAX) suppository 10 mg, 10 mg, Rectal, Daily PRN, GMagdalen Spatz NP .  Chlorhexidine Gluconate Cloth 2 % PADS 6 each, 6 each, Topical, Daily, DLuanne Bras MD, 6 each at 02/24/19 0813 .  docusate (COLACE) 50 MG/5ML liquid 100 mg, 100 mg, Oral, BID PRN, XRosalin Hawking MD .  furosemide (LASIX) tablet 80 mg, 80 mg, Oral, BID, HMinus Breeding MD, 80 mg at 02/24/19 0814 .  guaiFENesin (ROBITUSSIN) 100 MG/5ML solution 100 mg, 5 mL, Oral, Q6H, XRosalin Hawking MD, 100 mg at 02/24/19 03474.  hydrALAZINE (APRESOLINE) injection 5-10 mg, 5-10 mg, Intravenous, Q2H PRN, XRosalin Hawking MD .  metoprolol tartrate (LOPRESSOR) tablet 25 mg, 25 mg, Oral, BID, XRosalin Hawking MD, 25 mg at 02/24/19 02595.  morphine 2 MG/ML injection 0.5-1 mg, 0.5-1 mg, Intravenous, Q6H PRN, HCorey Harold NP .  nitroGLYCERIN (NITROSTAT) SL tablet 0.4 mg, 0.4 mg,  Sublingual, Q5 min PRN, LKerney Elbe MD .  ondansetron ([facility] injection 4 mg, 4 mg, Intravenous, Q6H PRN, Louk, Alexandra M, PA-C, 4 mg at 02/22/19 1014 .  pantoprazole (PROTONIX) injection 40 mg, 40 mg, Intravenous, Q24H, XRosalin Hawking MD, 40 mg at 02/23/19 1515 .  polyvinyl  alcohol (LIQUIFILM TEARS) 1.4 % ophthalmic solution 1 drop, 1 drop, Left Eye, PRN, Kerney Elbe, MD .  senna-docusate (Senokot-S) tablet 1 tablet, 1 tablet, Oral, QHS PRN, Rosalin Hawking, MD .  spironolactone (ALDACTONE) tablet 12.5 mg, 12.5 mg, Oral, Daily, Kerney Elbe, MD, 12.5 mg at 02/24/19 0814 .  warfarin (COUMADIN) tablet 5 mg, 5 mg, Oral, ONCE-1800, Rumbarger, Valeda Malm, RPH .  Warfarin - Pharmacist Dosing Inpatient, , Does not apply, q1800, Rumbarger, Valeda Malm, RPH  Patients Current Diet:  Diet Order            Diet full liquid Room service appropriate? No; Fluid consistency: Thin  Diet effective now              Precautions / Restrictions Precautions Precautions: Fall Precaution Comments: bil LE edema Restrictions Weight Bearing Restrictions: No   Has the patient had 2 or more falls or a fall with injury in the past year? Yes  Prior Activity Level Limited Community (1-2x/wk): "home body" per pt and husband, not driving  Prior Functional Level Self Care: Did the patient need help bathing, dressing, using the toilet or eating? Needed some help.   Indoor Mobility: Did the patient need assistance with walking from room to room (with or without device)? Independent  Stairs: Did the patient need assistance with internal or external stairs (with or without device)? Needed some help  Functional Cognition: Did the patient need help planning regular tasks such as shopping or remembering to take medications? Needed some help  Home Assistive Devices / [place] Devices/Equipment: Eyeglasses, Radio producer (specify quad or straight) Home Equipment: Cane - single point, Walker - 4 wheels, Bedside  commode, Grab bars - tub/shower  Prior Device Use: Indicate devices/aids used by the patient prior to current illness, exacerbation or injury? cane  Current Functional Level Cognition  Arousal/Alertness: Awake/alert Overall Cognitive Status: No family/caregiver present to determine baseline cognitive functioning Orientation Level: Oriented X4 Following Commands: Follows one step commands consistently, Follows one step commands with increased time, Follows multi-step commands inconsistently Safety/Judgement: Decreased awareness of safety General Comments: Pleasant - increased time to respond at times Attention: Sustained, Focused Focused Attention: Appears intact Sustained Attention: Appears intact Memory: Impaired Memory Impairment: Retrieval deficit, Decreased recall of new information(Immediate: 3/3; Delayed: 1/3 with cues: 2/2) Awareness: Appears intact Problem Solving: Appears intact(5/5) Executive Function: Reasoning, Sequencing Reasoning: Impaired Reasoning Impairment: Verbal complex(Abstraction: 1/3; with cues: 2/2)    Extremity Assessment (includes Sensation/Coordination)  Upper Extremity Assessment: Generalized weakness  Lower Extremity Assessment: Defer to PT evaluation    ADLs  Overall ADL's : Needs assistance/impaired Grooming: Set up, Wash/dry face Upper Body Bathing: Set up, Sitting Lower Body Bathing: Moderate assistance, Sit to/from stand Upper Body Dressing : Set up, Sitting Lower Body Dressing: Maximal assistance, Sit to/from stand Toilet Transfer: Moderate assistance, +2 for safety/equipment, Stand-pivot, BSC, RW Toilet Transfer Details (indicate cue type and reason): simulated with EOB>recliner. Increased time and effort. Cues for sequencing movements Toileting- Clothing Manipulation and Hygiene: Moderate assistance, Sit to/from stand Toileting - Clothing Manipulation Details (indicate cue type and reason): pt incontient of bladder during mobility to  restroom, mod assist for clothing mgmt/hygeine upon reaching commode Functional mobility during ADLs: Rolling walker, Minimal assistance General ADL Comments: Pt completed grooming task in bed, bed mobility and SPT to recliner.    Mobility  Overal bed mobility: Needs Assistance Bed Mobility: Supine to Sit Supine to sit: Min assist General bed mobility comments: increased time and effort, assist to pivot  hips to full EOB position.    Transfers  Overall transfer level: Needs assistance Equipment used: Rolling walker (2 wheeled) Transfers: Sit to/from Stand, [employer] Transfers Sit to Stand: Min assist Stand pivot transfers: Mod assist, +2 safety/equipment General transfer comment: VC's for hand placement on seated surface for safety. Increased time to power-up to full stand.     Ambulation / Gait / Stairs / Wheelchair Mobility  Ambulation/Gait Ambulation/Gait assistance: Min assist, Mod assist Gait Distance (Feet): 150 Feet Assistive device: Rolling walker (2 wheeled) Gait Pattern/deviations: Step-through pattern, Trunk flexed, Narrow base of support, Decreased step length - right, Decreased step length - left, Shuffle General Gait Details: Min-mod assist required for balance support with RW. As pt progressed with gait training, more consistently requiring min assist.  Gait velocity: Very slow Gait velocity interpretation: <1.31 ft/sec, indicative of household ambulator    Posture / Balance Dynamic Sitting Balance Sitting balance - Comments: onset of 9/10 headache pain once sitting EOb Balance Overall balance assessment: Needs assistance Sitting-balance support: Bilateral upper extremity supported, Feet supported Sitting balance-Leahy Scale: Fair Sitting balance - Comments: onset of 9/10 headache pain once sitting EOb Standing balance support: No upper extremity supported, During functional activity Standing balance-Leahy Scale: Poor Standing balance comment: 1 instance of max  assist required as pt was standing at the sink and attempting to fix her hair.     Special needs/care consideration BiPAP/CPAP no CPM no Continuous Drip IV no Dialysis no        Days no Life Vest no Oxygen no Special Bed no Trach Size no Wound Vac (area) no      Location n/a Skin intact                              Bowel mgmt: continent, last BM 02/21/2019 Bladder mgmt: continent Diabetic mgmt: no Behavioral consideration no Chemo/radiation no   Previous Home Environment (from acute therapy documentation) Living Arrangements: Spouse/significant other  Lives With: Spouse Available Help at Discharge: Family, Available 24 hours/day Type of Home: House Home Layout: Multi-level Alternate Level Stairs-Rails: Right Alternate Level Stairs-Number of Steps: 7 then 7 Home Access: Stairs to enter Entrance Stairs-Rails: None Entrance Stairs-Number of Steps: 1 Bathroom Shower/Tub: Chiropodist: Handicapped height Home Care Services: No  Discharge Living Setting Plans for Discharge Living Setting: Patient's home Type of Home at Discharge: House Discharge Home Layout: Multi-level, Bed/bath upstairs Alternate Level Stairs-Rails: Right Alternate Level Stairs-Number of Steps: 14 Discharge Home Access: Stairs to enter Entrance Stairs-Rails: None Entrance Stairs-Number of Steps: 1 Discharge Bathroom Shower/Tub: Tub/shower unit Discharge Bathroom Toilet: Handicapped height Discharge Bathroom Accessibility: Yes How Accessible: Accessible via walker Does the patient have any problems obtaining your medications?: No  Social/Family/Support Systems Anticipated Caregiver: pt's spouse, Jeneen Rinks Anticipated Ambulance person Information: [phone] Ability/Limitations of Caregiver: just had back/bladder surgery, can provide supervision to [facility] Caregiver Availability: 24/7 Discharge Plan Discussed with Primary Caregiver: Yes Is Caregiver In Agreement with Plan?: Yes Does  Caregiver/Family have Issues with Lodging/Transportation while Pt is in Rehab?: No  Goals/Additional Needs Patient/Family Goal for Rehab: PT/OT supervision, SLP min asisst Expected length of stay: 18-20 days Dietary Needs: reg/thin Pt/Family Agrees to Admission and willing to participate: Yes Program Orientation Provided & Reviewed with Pt/Caregiver Including Roles  & Responsibilities: Yes  Possible need for SNF placement upon discharge: no  Patient Condition: I have reviewed medical records from [facility], spoken with CM, and patient  and spouse. I met with patient at the bedside and discussed with spouse via phone for inpatient rehabilitation assessment.  Patient will benefit from ongoing PT, OT and SLP, can actively participate in 3 hours of therapy a day 5 days of the week, and can make measurable gains during the admission.  Patient will also benefit from the coordinated team approach during an Inpatient Acute Rehabilitation admission.  The patient will receive intensive therapy as well as Rehabilitation physician, nursing, social worker, and care management interventions.  Due to safety, skin/wound care, disease management, medication administration, pain management and patient education the patient requires 24 hour a day rehabilitation nursing.  The patient is currently mod +2 with mobility and basic ADLs.  Discharge setting and therapy post discharge at home with home health is anticipated.  Patient has agreed to participate in the Acute Inpatient Rehabilitation Program and will admit today.  Preadmission Screen Completed By:  Michel Santee, PT, DPT 02/24/2019 11:21 AM ______________________________________________________________________   Discussed status with Dr. Posey Pronto on 02/24/19  at 11:21 AM  and received approval for admission today.  Admission Coordinator:  Michel Santee, PT, DPT 11:21 AM Ladonya Grumbling 02/24/19    Assessment/Plan: Diagnosis: R MCA CVA, posterior  SDH/SAH  1. Does the need for close, 24 hr/day Medical supervision in concert with the patient's rehab needs make it unreasonable for this patient to be served in a less intensive setting? Yes  2. Co-Morbidities requiring supervision/potential complications: afib (mechanical heart valve and warfarin therapy), pacemaker, prior SDH, CAD, CHF, HTN, NSTEMI in 2017, and prior CVA  3. Due to safety, disease management and patient education, does the patient require 24 hr/day rehab nursing? Yes 4. Does the patient require coordinated care of a physician, rehab nurse, PT (1-2 hrs/day, 5 days/week), OT (1-2 hrs/day, 5 days/week) and SLP (1-2 hrs/day, 5 days/week) to address physical and functional deficits in the context of the above medical diagnosis(es)? Yes Addressing deficits in the following areas: balance, endurance, locomotion, strength, transferring, bathing, dressing, toileting and cognition 5. Can the patient actively participate in an intensive therapy program of at least 3 hrs of therapy 5 days a week? Yes 6. The potential for patient to make measurable gains while on inpatient rehab is excellent 7. Anticipated functional outcomes upon discharge from inpatients are: supervision PT, supervision OT, supervision SLP 8. Estimated rehab length of stay to reach the above functional goals is: 12-16 days. 9. Anticipated D/C setting: Home 10. Anticipated post D/C treatments: HH therapy and Home excercise program 11. Overall Rehab/Functional Prognosis: good  MD Signature: Delice Lesch, MD, ABPMR

## 2019-02-24 NOTE — Progress Notes (Signed)
NAME:  Carrie [name], MRN:  [id number], DOB:  08-17-1941, LOS: 7 ADMISSION DATE:  02/17/2019, CONSULTATION DATE:  02/17/2019 REFERRING MD:  Dr. Estanislado Pandy, CHIEF COMPLAINT:  AMS   Brief History   [name] who presented to [facility] 5/29 with left sided weakness, facial droop and slurred speech.  Work up consistent with large vessel occlusion and patient taken emergently to neuro IR for revascularization.  Past Medical History  SSS s/p Pacemker, Severe TR, Rheumatic Mitral Valve Disease , HTN, HLD, NSTEMI , CHF - chronic combined, CAD, AF - on anticoagulation, SDH   Significant Hospital Events   5/29-right M1 occlusion-->thrombectomy 5/31 extubated, tx to floor 6/3 worse cough and developed vomiting. SDH on CT head. Respiratory distress. PCCM consulted.  6/4 cough and hemoptysis improving. Working dx probable pneumonitis as result of vomiting resulting in hemoptysis  Consults:  PCCM  Procedures:  5/29 >> 5/31 ETT   Significant Diagnostic Tests:  5/15 Echo > LVEF 60-65%. Severe biatrial enlargement, moderate to severe TR 5/18 RHC > She had mild-moderate PAH but main issues is severe TR. She is not candidate for surgical TVR. Could consider experimental TV clip but currently not enrolling patient with pacemaker wires. Continue medical therapy 5/29 CT Head CODE Stroke >> no acute abnormality  5/29 CTA Head / Neck >> acute occlusion of the right M1 segment, delayed collateral flow to the R M2 branches.  Marked dilatation of the jugular veins bilaterally likely due to elevated right heart pressures.  6/3 CT head > Unusual acute posterior fossa hemorrhage. In the right posterior fossa subdural location is favored and in the prepontine cistern clot appears subarachnoid. There is cerebellar mass effect with patent fourth ventricle.   Micro Data:  COVID negative 5/29   Antimicrobials:    Interim history/subjective:  Continues to improve Has minimal hemoptysis Cough is better  Objective   Blood  pressure 104/64, pulse 81, temperature 98 F (36.7 C), temperature source Oral, resp. rate (!) 26, height 5\' 1"  (1.549 m), weight 58.8 kg, SpO2 95 %.        Intake/Output Summary (Last 24 hours) at 02/24/2019 1019 Last data filed at 02/24/2019 7782 Gross per 24 hour  Intake -  Output 2500 ml  Net -2500 ml   Filed Weights   02/17/19 1100 02/24/19 0500  Weight: 59 kg 58.8 kg    Examination: Gen:      No acute distress HEENT:  EOMI, sclera anicteric Neck:     No masses; no thyromegaly Lungs:    Clear to auscultation bilaterally; normal respiratory effort CV:         Regular rate and rhythm; no murmurs Abd:      + bowel sounds; soft, non-tender; no palpable masses, no distension Ext:    No edema; adequate peripheral perfusion Skin:      Warm and dry; no rash Neuro: alert and oriented x 3 Psych: normal mood and affect  Resolved Hospital Problem list   Acute Respiratory Insufficiency requiring Mechanical Ventilation (perioperative)  Assessment & Plan:   Right M1 Occlusion / CVA - s/p revascularization of right common carotid  Subdural hematoma in the posterior fossa with mass effect (felt to be secondary to elevated venous pressures with new onset cough and vomiting.) Plan  Per neuro team   Hypoxia w/ new cough and hemoptysis Recent vomiting episode would make possible aspiration event most likely explanation  Hemoptysis is probably secondary to cough in the setting of anticoagulation. Overall she is improving.  Plan Cont PRN morphine  Trend fever and WBC curve. Seems to be improving w/out abx so can hold off Noted plans to restart anticoagulation today.  If hemoptysis worsens then she will need a CT chest and please consult [place] again We will be available as needed  Chronic diastolic CHF  SSS s/p Pacemaker  Hx Bioprosthetic MV AF - on coumadin Plan Cardiology following. Lasix per primary/cards service   Marshell Garfinkel MD [place] [facility]  02/24/2019, 10:23 AM

## 2019-02-24 NOTE — Progress Notes (Signed)
Inpatient Rehab Admissions Coordinator:   I have approval from Dr. Erlinda Hong and insurance approval for pt to admit to CIR today. I have let pt/family, and CSW know.   Shann Medal, PT, DPT Admissions Coordinator [phone] 02/24/19  11:19 AM

## 2019-02-24 NOTE — Progress Notes (Signed)
Report called to [facility], RN on 4W.  Patient VSS and transferred to [place] [facility].

## 2019-02-24 NOTE — Progress Notes (Signed)
[name], MD  Physician  Physical Medicine and Rehabilitation  PMR Pre-admission  Signed  Date of Service:  02/24/2019 10:08 AM       Related encounter: ED to Hosp-Admission (Current) from 02/17/2019 in [place] [facility]      Signed         Show:Clear all [x] Manual[x] Template[] Copied  Added by: [x] Posey Pronto, Domenick Bookbinder, MD[x] Michel Santee, PT  [] Hover for details PMR Admission Coordinator Pre-Admission Assessment  Patient: Carrie [name] is an 78 y.o., female MRN: [id number] DOB: Feb 03, 1941 Height: 5' 1"  (154.9 cm) Weight: 58.8 kg  Insurance Information HMO: yes    PPO:      PCP:      IPA:      80/20:      OTHER:  PRIMARY: U[employer] Medicare      Policy#: [id number]      Subscriber: patient CM Name: Philippa Chester       Phone#:      Fax#:  Pre-Cert#: [id number], [place] for [place] admission provided by [facility] on 6/3 with updates due to Dorthula Nettles 7 days from admission (6/11) at [phone]      Employer: n/a Benefits:  Phone #: [phone]     Name:  Eff. Date: 09/21/2018     Deduct: $0      Out of Pocket Max: $3600 (met $62.14)      Life Max: n/a CIR: $295/day for days 1-5      SNF: 20 full days Outpatient: $30/visit     Co-Pay:  Home Health: 100      Co-Pay:  DME: 80%     Co-Pay: 20% Providers: in network  SECONDARY:       Policy#:       Subscriber:  CM Name:       Phone#:      Fax#:  Pre-Cert#:       Employer:  Benefits:  Phone #:      Name:  Eff. Date:      Deduct:       Out of Pocket Max:       Life Max:  CIR:       SNF:  Outpatient:      Co-Pay:  Home Health:       Co-Pay:  DME:      Co-Pay:   Medicaid Application Date:       Case Manager:  Disability Application Date:       Case Worker:   The "Data Collection Information Summary" for patients in Inpatient Rehabilitation Facilities with attached "Privacy Act [place] Records" was provided and verbally reviewed with: Patient and Family  Emergency Contact Information          Contact Information    Name Relation Home Work [place] [phone]  [phone]   Malena Peer Sister   [place] Relative   [phone]      Current Medical History  Patient Admitting Diagnosis: R MCA CVA, posterior SDH/SAH  History of Present Illness: Pt is a 78 y/o female with PMH of afib (mechanical heart valve and warfarin therapy), pacemaker, prior SDH, CAD, CHF, HTN, NSTEMI in 2017, and prior 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  She did not receive tPA due to wafarin therapy.  CTA revealed right M1 which was re-vascularized with IR.  Repeat head CTs on 5/29 and 5/30 stable.  2D echo and dopplers negative.  On 6/3 pt  developed worsening mental status/weakness with + hemoptysis, stat CT revealed acute R posterior fossa SDH and pre-pontine SAH.  Heparin and coumadin reversed.  Pt repeat CT on 6/5 showed nearly resolved SDH/SAH.  Pharmacy consulted for resumption of coumadin without heparin. Therapy evaluations completed and recommended CIR.     Complete NIHSS TOTAL: 0  Patient's medical record from [facility] has been reviewed by the rehabilitation admission coordinator and physician.  Past Medical History      Past Medical History:  Diagnosis Date  . Anticoagulated on Coumadin    for mech valve and atrial fib  goal 2.0-2.5  . Anxiety   . Arthritis    "right shoulder" (03/15/2013)  . Atrial fibrillation ([place])   . Atrial flutter ([place])   . CAD (coronary artery disease) 08/11/2016  . Chronic combined systolic and diastolic CHF (congestive heart failure) ([place])    a. 02/2016 Echo: EF 45-50%.  . Diverticula of colon   . GERD (gastroesophageal reflux disease)   . Hemorrhage intraabdominal 03/17/2012  . Hyperlipidemia   . Hypertension   . Liver hemorrhage   . Migraines   . 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 Bi-leaflet St. Jude mechanical  prosthesis; b. 02/2016 Echo: EF 45-50%, some degree of MR, sev dil LA/RA, sev TR.  . NSTEMI (non-ST elevated myocardial infarction) ([place])    a. 02/2016 elev trop/Cath: nonobs dzs,   . Pacemaker    medtronic adapta  . Severe tricuspid regurgitation    a. 02/2016 Echo: Ef 45-50%, sev TR, PASP 9mHg.  . Sick sinus syndrome ([place]    Dr GCristopher Peru EP study negative. Pacemaker 12/15/06 Medtronic  . Stroke ([facility]    "they say I had a stroke last year" denies residual on 03/15/2013  . Subdural hematoma ([facility])     Family History   family history includes Cancer in her mother and sister; Diabetes in her sister; Healthy in her brother, brother, brother, and sister; Heart attack in her maternal grandfather; Kidney disease in her daughter; Leukemia in her sister; Stroke in her brother and father.  Prior Rehab/Hospitalizations Has the patient had prior rehab or hospitalizations prior to admission? Yes  Has the patient had major surgery during 100 days prior to admission? Yes             Current Medications  Current Facility-Administered Medications:  .  acetaminophen (TYLENOL) tablet 650 mg, 650 mg, Oral, Q4H PRN **OR** [DISCONTINUED] acetaminophen (TYLENOL) solution 650 mg, 650 mg, Per Tube, Q4H PRN **OR** acetaminophen (TYLENOL) suppository 650 mg, 650 mg, Rectal, Q4H PRN, LKerney Elbe MD .  albuterol (PROVENTIL) (2.5 MG/3ML) 0.083% nebulizer solution 2.5 mg, 2.5 mg, Nebulization, Q6H PRN, GMagdalen Spatz NP, 2.5 mg at 02/22/19 0557 .  aspirin EC tablet 81 mg, 81 mg, Oral, Daily, XRosalin Hawking MD, 81 mg at 02/24/19 1050 .  bisacodyl (DULCOLAX) suppository 10 mg, 10 mg, Rectal, Daily PRN, GMagdalen Spatz NP .  Chlorhexidine Gluconate Cloth 2 % PADS 6 each, 6 each, Topical, Daily, DLuanne Bras MD, 6 each at 02/24/19 0813 .  docusate (COLACE) 50 MG/5ML liquid 100 mg, 100 mg, Oral, BID PRN, XRosalin Hawking MD .  furosemide (LASIX) tablet 80 mg, 80 mg, Oral, BID, HMinus Breeding MD,  80 mg at 02/24/19 0814 .  guaiFENesin (ROBITUSSIN) 100 MG/5ML solution 100 mg, 5 mL, Oral, Q6H, XRosalin Hawking MD, 100 mg at 02/24/19 09476.  hydrALAZINE (APRESOLINE) injection 5-10 mg, 5-10 mg, Intravenous, Q2H PRN, XRosalin Hawking MD .  metoprolol tartrate (LOPRESSOR) tablet 25 mg, 25 mg, Oral, BID, Rosalin Hawking, MD, 25 mg at 02/24/19 0814 .  morphine 2 MG/ML injection 0.5-1 mg, 0.5-1 mg, Intravenous, Q6H PRN, Corey Harold, NP .  nitroGLYCERIN (NITROSTAT) SL tablet 0.4 mg, 0.4 mg, Sublingual, Q5 min PRN, Kerney Elbe, MD .  ondansetron [facility]) injection 4 mg, 4 mg, Intravenous, Q6H PRN, Louk, Alexandra M, PA-C, 4 mg at 02/22/19 1014 .  pantoprazole (PROTONIX) injection 40 mg, 40 mg, Intravenous, Q24H, Rosalin Hawking, MD, 40 mg at 02/23/19 1515 .  polyvinyl alcohol (LIQUIFILM TEARS) 1.4 % ophthalmic solution 1 drop, 1 drop, Left Eye, PRN, Kerney Elbe, MD .  senna-docusate (Senokot-S) tablet 1 tablet, 1 tablet, Oral, QHS PRN, Rosalin Hawking, MD .  spironolactone (ALDACTONE) tablet 12.5 mg, 12.5 mg, Oral, Daily, Kerney Elbe, MD, 12.5 mg at 02/24/19 0814 .  warfarin (COUMADIN) tablet 5 mg, 5 mg, Oral, ONCE-1800, Rumbarger, Valeda Malm, RPH .  Warfarin - Pharmacist Dosing Inpatient, , Does not apply, q1800, Rumbarger, Valeda Malm, RPH  Patients Current Diet:     Diet Order                  Diet full liquid Room service appropriate? No; Fluid consistency: Thin  Diet effective now               Precautions / Restrictions Precautions Precautions: Fall Precaution Comments: bil LE edema Restrictions Weight Bearing Restrictions: No   Has the patient had 2 or more falls or a fall with injury in the past year? Yes  Prior Activity Level Limited Community (1-2x/wk): "home body" per pt and husband, not driving  Prior Functional Level Self Care: Did the patient need help bathing, dressing, using the toilet or eating? Needed some help.   Indoor Mobility: Did the patient need assistance  with walking from room to room (with or without device)? Independent  Stairs: Did the patient need assistance with internal or external stairs (with or without device)? Needed some help  Functional Cognition: Did the patient need help planning regular tasks such as shopping or remembering to take medications? Needed some help  Home Assistive Devices / [place] Devices/Equipment: Eyeglasses, Radio producer (specify quad or straight) Home Equipment: Cane - single point, Walker - 4 wheels, Bedside commode, Grab bars - tub/shower  Prior Device Use: Indicate devices/aids used by the patient prior to current illness, exacerbation or injury? cane  Current Functional Level Cognition  Arousal/Alertness: Awake/alert Overall Cognitive Status: No family/caregiver present to determine baseline cognitive functioning Orientation Level: Oriented X4 Following Commands: Follows one step commands consistently, Follows one step commands with increased time, Follows multi-step commands inconsistently Safety/Judgement: Decreased awareness of safety General Comments: Pleasant - increased time to respond at times Attention: Sustained, Focused Focused Attention: Appears intact Sustained Attention: Appears intact Memory: Impaired Memory Impairment: Retrieval deficit, Decreased recall of new information(Immediate: 3/3; Delayed: 1/3 with cues: 2/2) Awareness: Appears intact Problem Solving: Appears intact(5/5) Executive Function: Reasoning, Sequencing Reasoning: Impaired Reasoning Impairment: Verbal complex(Abstraction: 1/3; with cues: 2/2)    Extremity Assessment (includes Sensation/Coordination)  Upper Extremity Assessment: Generalized weakness  Lower Extremity Assessment: Defer to PT evaluation    ADLs  Overall ADL's : Needs assistance/impaired Grooming: Set up, Wash/dry face Upper Body Bathing: Set up, Sitting Lower Body Bathing: Moderate assistance, Sit to/from stand Upper Body  Dressing : Set up, Sitting Lower Body Dressing: Maximal assistance, Sit to/from stand Toilet Transfer: Moderate assistance, +2 for safety/equipment, Stand-pivot, BSC, RW Toilet Transfer Details (indicate  cue type and reason): simulated with EOB>recliner. Increased time and effort. Cues for sequencing movements Toileting- Clothing Manipulation and Hygiene: Moderate assistance, Sit to/from stand Toileting - Clothing Manipulation Details (indicate cue type and reason): pt incontient of bladder during mobility to restroom, mod assist for clothing mgmt/hygeine upon reaching commode Functional mobility during ADLs: Rolling walker, Minimal assistance General ADL Comments: Pt completed grooming task in bed, bed mobility and SPT to recliner.    Mobility  Overal bed mobility: Needs Assistance Bed Mobility: Supine to Sit Supine to sit: Min assist General bed mobility comments: increased time and effort, assist to pivot hips to full EOB position.    Transfers  Overall transfer level: Needs assistance Equipment used: Rolling walker (2 wheeled) Transfers: Sit to/from Stand, [employer] Transfers Sit to Stand: Min assist Stand pivot transfers: Mod assist, +2 safety/equipment General transfer comment: VC's for hand placement on seated surface for safety. Increased time to power-up to full stand.     Ambulation / Gait / Stairs / Wheelchair Mobility  Ambulation/Gait Ambulation/Gait assistance: Min assist, Mod assist Gait Distance (Feet): 150 Feet Assistive device: Rolling walker (2 wheeled) Gait Pattern/deviations: Step-through pattern, Trunk flexed, Narrow base of support, Decreased step length - right, Decreased step length - left, Shuffle General Gait Details: Min-mod assist required for balance support with RW. As pt progressed with gait training, more consistently requiring min assist.  Gait velocity: Very slow Gait velocity interpretation: <1.31 ft/sec, indicative of household ambulator     Posture / Balance Dynamic Sitting Balance Sitting balance - Comments: onset of 9/10 headache pain once sitting EOb Balance Overall balance assessment: Needs assistance Sitting-balance support: Bilateral upper extremity supported, Feet supported Sitting balance-Leahy Scale: Fair Sitting balance - Comments: onset of 9/10 headache pain once sitting EOb Standing balance support: No upper extremity supported, During functional activity Standing balance-Leahy Scale: Poor Standing balance comment: 1 instance of max assist required as pt was standing at the sink and attempting to fix her hair.     Special needs/care consideration BiPAP/CPAP no CPM no Continuous Drip IV no Dialysis no        Days no Life Vest no Oxygen no Special Bed no Trach Size no Wound Vac (area) no      Location n/a Skin intact                              Bowel mgmt: continent, last BM 02/21/2019 Bladder mgmt: continent Diabetic mgmt: no Behavioral consideration no Chemo/radiation no   Previous Home Environment (from acute therapy documentation) Living Arrangements: Spouse/significant other  Lives With: Spouse Available Help at Discharge: Family, Available 24 hours/day Type of Home: House Home Layout: Multi-level Alternate Level Stairs-Rails: Right Alternate Level Stairs-Number of Steps: 7 then 7 Home Access: Stairs to enter Entrance Stairs-Rails: None Entrance Stairs-Number of Steps: 1 Bathroom Shower/Tub: Chiropodist: Handicapped height Home Care Services: No  Discharge Living Setting Plans for Discharge Living Setting: Patient's home Type of Home at Discharge: House Discharge Home Layout: Multi-level, Bed/bath upstairs Alternate Level Stairs-Rails: Right Alternate Level Stairs-Number of Steps: 14 Discharge Home Access: Stairs to enter Entrance Stairs-Rails: None Entrance Stairs-Number of Steps: 1 Discharge Bathroom Shower/Tub: Tub/shower unit Discharge Bathroom Toilet:  Handicapped height Discharge Bathroom Accessibility: Yes How Accessible: Accessible via walker Does the patient have any problems obtaining your medications?: No  Social/Family/Support Systems Anticipated Caregiver: pt's spouse, Jeneen Rinks Anticipated Ambulance person Information: [phone] Ability/Limitations of Caregiver: just had  back/bladder surgery, can provide supervision to [facility] Caregiver Availability: 24/7 Discharge Plan Discussed with Primary Caregiver: Yes Is Caregiver In Agreement with Plan?: Yes Does Caregiver/Family have Issues with Lodging/Transportation while Pt is in Rehab?: No  Goals/Additional Needs Patient/Family Goal for Rehab: PT/OT supervision, SLP min asisst Expected length of stay: 18-20 days Dietary Needs: reg/thin Pt/Family Agrees to Admission and willing to participate: Yes Program Orientation Provided & Reviewed with Pt/Caregiver Including Roles  & Responsibilities: Yes  Possible need for SNF placement upon discharge: no  Patient Condition: I have reviewed medical records from [facility], spoken with CM, and patient and spouse. I met with patient at the bedside and discussed with spouse via phone for inpatient rehabilitation assessment.  Patient will benefit from ongoing PT, OT and SLP, can actively participate in 3 hours of therapy a day 5 days of the week, and can make measurable gains during the admission.  Patient will also benefit from the coordinated team approach during an Inpatient Acute Rehabilitation admission.  The patient will receive intensive therapy as well as Rehabilitation physician, nursing, social worker, and care management interventions.  Due to safety, skin/wound care, disease management, medication administration, pain management and patient education the patient requires 24 hour a day rehabilitation nursing.  The patient is currently mod +2 with mobility and basic ADLs.  Discharge setting and therapy post discharge at home with  home health is anticipated.  Patient has agreed to participate in the Acute Inpatient Rehabilitation Program and will admit today.  Preadmission Screen Completed By:  Michel Santee, PT, DPT 02/24/2019 11:21 AM ______________________________________________________________________   Discussed status with Dr. Posey Pronto on 02/24/19  at 11:21 AM  and received approval for admission today.  Admission Coordinator:  Michel Santee, PT, DPT 11:21 AM Dorleen Grumbling 02/24/19    Assessment/Plan: Diagnosis: R MCA CVA, posterior SDH/SAH  1. Does the need for close, 24 hr/day Medical supervision in concert with the patient's rehab needs make it unreasonable for this patient to be served in a less intensive setting? Yes  2. Co-Morbidities requiring supervision/potential complications: afib (mechanical heart valve and warfarin therapy), pacemaker, prior SDH, CAD, CHF, HTN, NSTEMI in 2017, and prior CVA  3. Due to safety, disease management and patient education, does the patient require 24 hr/day rehab nursing? Yes 4. Does the patient require coordinated care of a physician, rehab nurse, PT (1-2 hrs/day, 5 days/week), OT (1-2 hrs/day, 5 days/week) and SLP (1-2 hrs/day, 5 days/week) to address physical and functional deficits in the context of the above medical diagnosis(es)? Yes Addressing deficits in the following areas: balance, endurance, locomotion, strength, transferring, bathing, dressing, toileting and cognition 5. Can the patient actively participate in an intensive therapy program of at least 3 hrs of therapy 5 days a week? Yes 6. The potential for patient to make measurable gains while on inpatient rehab is excellent 7. Anticipated functional outcomes upon discharge from inpatients are: supervision PT, supervision OT, supervision SLP 8. Estimated rehab length of stay to reach the above functional goals is: 12-16 days. 9. Anticipated D/C setting: Home 10. Anticipated post D/C treatments: HH therapy and Home  excercise program 11. Overall Rehab/Functional Prognosis: good  MD Signature: Delice Lesch, MD, ABPMR        Revision History

## 2019-02-24 NOTE — H&P (Addendum)
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  History taken from chart review and patient.  CTA head/neck done revealing acute occlusion of right M1 brach with delayed collateral flow and marked enlargement of superior bilateral ophthalmic vein.  INR subtherapeutic at admission and she underwent cerebral angio with complete revascularization of R-MCA M1 segment with TICI revascularization. Dr. Erlinda Hong felt that stroke was cardioembolic due to subtherapeutic INR in setting of A fib/mechanical valve. She was started on IV heparin and coumadin 5/31.  She has had issue with intermittent coughing and developed diffuse wheezing.  CXR negative--continues on supplemental oxygen. Cardiology consulted for input due to reports of progressive SOB for 2-3 weeks PTA and felt that symptoms multifactorial she was briefly treated with IV diuretics.  Hospital course further complicated by acute on chronic anemia, with steady decline. She developed N/V with hematemesis and H/A on 6/3.  Stat CT head reviewed, showing posterior right SDH.  Per report, right posterior fossa SDH with paramedian SAH and heparin reversed with protamine and Coumadin held.  Dr. Vaughan Browner felt that hemoptysis likely due to pneumonitis from bleeding and anticoagulation. He  questioned development of interstitial lung disease due to amiodarone leading to SOB. Her symptoms are improving and he recommends chest CT if hemoptysis worsens.  CT head reviewed, showing improvement.  Coumadin/ASA to be resumed with INR goal 2.0-2.5.  GI symptoms and HA have  resolved but she continues to have balance deficits and weakness affecting ADLs and mobility. CIR recommended due to functional decline. Please also see preadmission assessment from earlier today.  Review of Systems  Constitutional: Negative for chills and fever.  HENT: Negative for hearing loss and tinnitus.   Eyes: Negative for blurred vision and double vision.  Respiratory: Negative for cough and shortness of breath.   Cardiovascular: Negative for chest pain, palpitations and leg swelling.  Gastrointestinal: Negative for heartburn and nausea.  Genitourinary: Negative for dysuria.  Musculoskeletal: Positive for joint pain (right knee). Negative for myalgias.  Skin: Negative for itching and rash.  Neurological: Positive for focal weakness. Negative for dizziness and headaches.  Psychiatric/Behavioral: The patient has insomnia. The patient is not nervous/anxious.   All other systems reviewed and are negative.  Past Medical History:  Diagnosis Date   Anticoagulated on Coumadin    for mech valve and atrial fib  goal 2.0-2.5   Anxiety    Arthritis    "right shoulder" (03/15/2013)   Atrial fibrillation (HCC)    Atrial flutter (HCC)    CAD (coronary artery disease) 08/11/2016   Chronic combined systolic and diastolic CHF (congestive heart failure) ([place])    a. 02/2016 Echo: EF 45-50%.   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 Bi-leaflet St. Jude mechanical prosthesis; b. 02/2016 Echo: EF 45-50%, some degree of MR, sev dil LA/RA, sev TR.   NSTEMI (non-ST elevated myocardial infarction) ([place])  a. 02/2016 elev trop/Cath: nonobs dzs,    Pacemaker    medtronic adapta   Severe tricuspid regurgitation    a. 02/2016 Echo: Ef 45-50%, sev TR, PASP 62mmHg.   Sick sinus syndrome (HCC)    Dr  Cristopher Peru. EP study negative. Pacemaker 12/15/06 Medtronic   Stroke [facility])    "they say I had a stroke last year" denies residual on 03/15/2013   Subdural hematoma [facility])     Past Surgical History:  Procedure Laterality Date   APPENDECTOMY     CARDIAC CATHETERIZATION  04/02/97   R&L:severe MR/pulmonary hypertension   CARDIAC CATHETERIZATION N/A 03/16/2016   Procedure: Left Heart Cath and Coronary Angiography;  Surgeon: [name], MD;  Location: [place] CV LAB;  Service: Cardiovascular;  Laterality: N/A;   CARDIAC CATHETERIZATION N/A 09/30/2016   Procedure: Left Heart Cath and Coronary Angiography;  Surgeon: [name], MD;  Location: [place] CV LAB;  Service: Cardiovascular;  Laterality: N/A;   CARDIAC CATHETERIZATION N/A 09/30/2016   Procedure: Coronary Stent Intervention;  Surgeon: [name], MD;  3.5 x 12 mm resolute Onyx  to ostial RCA   CATARACT EXTRACTION W/ INTRAOCULAR LENS  IMPLANT, BILATERAL Bilateral 2013   CORONARY ANGIOPLASTY     DILATION AND CURETTAGE OF UTERUS     "had fibroids" (03/15/2013)   EXPLORATORY LAPAROTOMY     "had a growth on my intestines" (03/15/2013)   HAMMER TOE SURGERY Right    INSERT / REPLACE / REMOVE PACEMAKER  12/15/2006   Medtronic   IR ANGIO VERTEBRAL SEL SUBCLAVIAN INNOMINATE UNI R MOD SED  02/17/2019   IR CT HEAD LTD  02/17/2019   IR PERCUTANEOUS ART THROMBECTOMY/INFUSION INTRACRANIAL INC DIAG ANGIO  02/17/2019   MITRAL VALVE REPLACEMENT  1998   St Jude mechanical; Dr. [name]   PACEMAKER PLACEMENT  12/15/06   medtronic adapta for SSS   PERMANENT PACEMAKER GENERATOR CHANGE N/A 07/24/2014   Procedure: PERMANENT PACEMAKER GENERATOR CHANGE;  Surgeon: [name], MD;  Location: [place] CATH LAB;  Service: Cardiovascular;  Laterality: N/A;   PERSANTINE CARDIOLITE  08/07/03   mild inf. ischemia    RADIOLOGY WITH ANESTHESIA N/A 02/17/2019   Procedure: IR WITH ANESTHESIA;  Surgeon: [name], MD;   Location: [place];  Service: Radiology;  Laterality: N/A;   RIGHT HEART CATH N/A 02/06/2019   Procedure: RIGHT HEART CATH;  Surgeon: [name], MD;  Location: [place] CV LAB;  Service: Cardiovascular;  Laterality: N/A;   TEE WITHOUT CARDIOVERSION  09/23/2011   Procedure: TRANSESOPHAGEAL ECHOCARDIOGRAM (TEE);  Surgeon: [name];  Location: [facility];  Service: Cardiovascular;  Laterality: N/A;   TONSILLECTOMY     TUBAL LIGATION     US ECHOCARDIOGRAPHY  11/19/2011   EF 50-55%,RA mod to severely dilated,LA severely dilated,trace MR,small vegetation or mass on the MV,AOV mildly scleroticmild PI, RV pressure 40-63mmHg    Family History  Problem Relation Age of Onset   Cancer Mother    Stroke Father    Cancer Sister    Leukemia Sister    Healthy Brother    Healthy Sister    Diabetes Sister    Healthy Brother    Healthy Brother    Heart attack Maternal Grandfather    Kidney disease Daughter    Stroke Brother     Social History:  Married. Husband with recent diagnosis of bladder cancer. Used to work for [employer]. She reports that she has never smoked. She has never used  smokeless tobacco. She reports that she does not drink alcohol or use drugs.    Allergies  Allergen Reactions   Codeine Nausea And Vomiting    Medications Prior to Admission  Medication Sig Dispense Refill   atorvastatin (LIPITOR) 40 MG tablet TAKE 1 TABLET BY MOUTH EVERY DAY AT 6PM (Patient taking differently: Take 40 mg by mouth daily at 6 PM. ) 30 tablet 6   Biotin 1000 MCG tablet Take 1,000 mcg by mouth daily.     dextromethorphan-guaiFENesin (MUCINEX DM) 30-600 MG 12hr tablet Take 1 tablet by mouth 2 (two) times daily as needed for cough. 20 tablet 0   docusate sodium (COLACE) 100 MG capsule Take 200 mg by mouth 2 (two) times daily.      furosemide (LASIX) 80 MG tablet Take 1 tablet (80 mg total) by mouth 2 (two) times daily. 60 tablet 0   metolazone (ZAROXOLYN) 2.5  MG tablet Take 2.5 mg when your weight is above 137 lbs as recommended by cardiology. (Patient taking differently: Take 2.5 mg by mouth as needed. Take 2.5 mg when your weight is above 137 lbs as recommended by cardiology.) 10 tablet 0   metoprolol tartrate (LOPRESSOR) 25 MG tablet Take 1 tablet (25 mg total) by mouth 2 (two) times daily. 180 tablet 3   Multiple Vitamin (MULTIVITAMINS PO) Take 1 tablet by mouth daily.      nitroGLYCERIN (NITROSTAT) 0.4 MG SL tablet Place 1 tablet (0.4 mg total) under the tongue every 5 (five) minutes as needed for chest pain. 225 tablet 0   Omega-3 Fatty Acids (FISH OIL) 1000 MG CAPS Take 1,000 mg by mouth daily.     polyethylene glycol (MIRALAX / GLYCOLAX) packet Take 17 g by mouth as needed (for constipation). Reported on 03/25/2016     polyvinyl alcohol (LIQUIFILM TEARS) 1.4 % ophthalmic solution Place 1 drop into the left eye as needed for dry eyes. 15 mL 0   potassium chloride SA (K-DUR,KLOR-CON) 20 MEQ tablet Take 2 tablets (40 mEq total) by mouth every morning AND 1 tablet (20 mEq total) every evening. 270 tablet 3   spironolactone (ALDACTONE) 25 MG tablet Take 0.5 tablets (12.5 mg total) by mouth daily. 30 tablet 0   [DISCONTINUED] warfarin (COUMADIN) 5 MG tablet TAKE 1 TO 1 AND 1/2 TABLETS BY MOUTH DAILY AS DIRECTED (Patient taking differently: Take 2.5-5 mg by mouth See admin instructions. Taking 1/2 tablet (2.5) mg on Monday and Thursday. All other days 5mg  tablet) 120 tablet 1    Drug Regimen Review  Drug regimen was reviewed and remains appropriate with no significant issues identified  Home: Home Living Family/patient expects to be discharged to:: Private residence Living Arrangements: Spouse/significant other Available Help at Discharge: Family, Available 24 hours/day Type of Home: House Home Access: Stairs to enter Technical brewer of Steps: 1 Entrance Stairs-Rails: None Home Layout: Multi-level Alternate Level Stairs-Number of  Steps: 7 then 7 Alternate Level Stairs-Rails: Right Bathroom Shower/Tub: Chiropodist: Handicapped height Home Equipment: [place] - single point, Environmental consultant - 4 wheels, Bedside commode, Grab bars - tub/shower  Lives With: Spouse   Functional History: Prior Function Level of Independence: Needs assistance Gait / Transfers Assistance Needed: use of SPC for mobility ADL's / Homemaking Assistance Needed: pt reports spouse assist with LB ADLS (compression hose/shoes), but she was able to complete all other self care without assist; basin bathes only Comments: History of a fall down her steps one month ago  Functional Status:  Mobility: Bed  Mobility Overal bed mobility: Needs Assistance Bed Mobility: Supine to Sit Supine to sit: Mod assist General bed mobility comments: increased time and effort, assist to pivot hips to full EOB position. Transfers Overall transfer level: Needs assistance Equipment used: Rolling walker (2 wheeled) Transfers: Sit to/from Stand, [employer] Transfers Sit to Stand: Mod assist Stand pivot transfers: Mod assist, +2 safety/equipment General transfer comment: Assist to powerup. Cues for sequencing and assist to steady during pivoting. Cues for hand placement. Increased time required throughout.  Ambulation/Gait Ambulation/Gait assistance: Min assist Gait Distance (Feet): 25 Feet Assistive device: Rolling walker (2 wheeled) Gait Pattern/deviations: Step-through pattern, Trunk flexed, Narrow base of support, Decreased step length - right, Decreased step length - left, Shuffle General Gait Details: Gait training deferred this session as pt with 9/10 headache with supine>sit and sit>stand. Pt reports improvement with sitting in chair.  Gait velocity: Very slow Gait velocity interpretation: <1.31 ft/sec, indicative of household ambulator    ADL: ADL Overall ADL's : Needs assistance/impaired Grooming: Set up, Wash/dry face Upper Body Bathing: Set  up, Sitting Lower Body Bathing: Moderate assistance, Sit to/from stand Upper Body Dressing : Set up, Sitting Lower Body Dressing: Maximal assistance, Sit to/from stand Toilet Transfer: Moderate assistance, +2 for safety/equipment, Stand-pivot, BSC, RW Toilet Transfer Details (indicate cue type and reason): simulated with EOB>recliner. Increased time and effort. Cues for sequencing movements Toileting- Clothing Manipulation and Hygiene: Moderate assistance, Sit to/from stand Toileting - Clothing Manipulation Details (indicate cue type and reason): pt incontient of bladder during mobility to restroom, mod assist for clothing mgmt/hygeine upon reaching commode Functional mobility during ADLs: Rolling walker, Minimal assistance General ADL Comments: Pt completed grooming task in bed, bed mobility and SPT to recliner.  Cognition: Cognition Overall Cognitive Status: No family/caregiver present to determine baseline cognitive functioning Arousal/Alertness: Awake/alert Orientation Level: Oriented X4 Attention: Sustained, Focused Focused Attention: Appears intact Sustained Attention: Appears intact Memory: Impaired Memory Impairment: Retrieval deficit, Decreased recall of new information(Immediate: 3/3; Delayed: 1/3 with cues: 2/2) Awareness: Appears intact Problem Solving: Appears intact(5/5) Executive Function: Reasoning, Sequencing Reasoning: Impaired Reasoning Impairment: Verbal complex(Abstraction: 1/3; with cues: 2/2) Cognition Arousal/Alertness: Awake/alert Behavior During Therapy: Flat affect Overall Cognitive Status: No family/caregiver present to determine baseline cognitive functioning Area of Impairment: Following commands, Safety/judgement, Awareness, Problem solving Following Commands: Follows one step commands consistently, Follows one step commands with increased time, Follows multi-step commands inconsistently Safety/Judgement: Decreased awareness of safety Awareness:  Emergent Problem Solving: Slow processing, Decreased initiation, Difficulty sequencing, Requires verbal cues General Comments: minimal verbalizations, pleasant   Blood pressure 107/65, pulse (!) 52, temperature 98 F (36.7 C), temperature source Oral, resp. rate (!) 26, height 5\' 1"  (1.549 m), weight 58.8 kg, SpO2 95 %. Physical Exam  Nursing note and vitals reviewed. Constitutional: She is oriented to person, place, and time. She appears well-developed and well-nourished.  HENT:  Head: Normocephalic and atraumatic.  Eyes: EOM are normal. Right eye exhibits no discharge. Left eye exhibits no discharge.  Respiratory: Effort normal. No respiratory distress. She has no wheezes. She exhibits no tenderness.  + [place]  GI: She exhibits no distension.  Musculoskeletal:        General: No edema.     Comments: No edema or tenderness in extremities  Neurological: She is alert and oriented to person, place, and time.  Up in chair leaning to the right.  Speech clear.  Alert oriented x2 Able to follow one and two step commands with increased time Motor: Grossly 4+-5/5 throughout  Skin:  Skin is warm and dry.  Psychiatric: Her affect is blunt. Her speech is delayed and tangential. She is slowed.    Results for orders placed or performed during the hospital encounter of 02/17/19 (from the past 48 hour(s))  Glucose, capillary     Status: Abnormal   Collection Time: 02/22/19  4:23 PM  Result Value Ref Range   Glucose-Capillary 121 (H) 70 - 99 mg/dL  Protime-INR     Status: Abnormal   Collection Time: 02/23/19  5:39 AM  Result Value Ref Range   Prothrombin Time 18.5 (H) 11.4 - 15.2 seconds   INR 1.6 (H) 0.8 - 1.2    Comment: (NOTE) INR goal varies based on device and disease states. Performed at [facility], [place] [address] [place], [place] [address]   Basic metabolic panel     Status: Abnormal   Collection Time: 02/23/19  5:39 AM  Result Value Ref Range   Sodium 135 135 - 145 mmol/L    Potassium 3.9 3.5 - 5.1 mmol/L    Comment: NO VISIBLE HEMOLYSIS   Chloride 101 98 - 111 mmol/L   CO2 24 22 - 32 mmol/L   Glucose, Bld 95 70 - 99 mg/dL   BUN 32 (H) 8 - 23 mg/dL   Creatinine, Ser 1.21 (H) 0.44 - 1.00 mg/dL   Calcium 9.4 8.9 - 10.3 mg/dL   GFR calc non Af Amer 43 (L) >60 mL/min   GFR calc Af Amer 50 (L) >60 mL/min   Anion gap 10 5 - 15    Comment: Performed at [facility], [place] [address], [place], [place] [address]  CBC     Status: Abnormal   Collection Time: 02/23/19  5:39 AM  Result Value Ref Range   WBC 5.2 4.0 - 10.5 K/uL   RBC 2.86 (L) 3.87 - 5.11 MIL/uL   Hemoglobin 8.5 (L) 12.0 - 15.0 g/dL   HCT 25.4 (L) 36.0 - 46.0 %   MCV 88.8 80.0 - 100.0 fL   MCH 29.7 26.0 - 34.0 pg   MCHC 33.5 30.0 - 36.0 g/dL   RDW 25.0 (H) 11.5 - 15.5 %   Platelets 176 150 - 400 K/uL    Comment: REPEATED TO VERIFY   nRBC 0.4 (H) 0.0 - 0.2 %    Comment: Performed at [facility], [place] [address] [place], [place] [address]  Protime-INR     Status: Abnormal   Collection Time: 02/24/19  3:38 AM  Result Value Ref Range   Prothrombin Time 18.7 (H) 11.4 - 15.2 seconds   INR 1.6 (H) 0.8 - 1.2    Comment: (NOTE) INR goal varies based on device and disease states. Performed at [facility], [place] [address] [place], [place] [address]   Basic metabolic panel     Status: Abnormal   Collection Time: 02/24/19  3:38 AM  Result Value Ref Range   Sodium 136 135 - 145 mmol/L   Potassium 3.5 3.5 - 5.1 mmol/L   Chloride 100 98 - 111 mmol/L   CO2 26 22 - 32 mmol/L   Glucose, Bld 97 70 - 99 mg/dL   BUN 31 (H) 8 - 23 mg/dL   Creatinine, Ser 1.19 (H) 0.44 - 1.00 mg/dL   Calcium 9.3 8.9 - 10.3 mg/dL   GFR calc non Af Amer 44 (L) >60 mL/min   GFR calc Af Amer 51 (L) >60 mL/min   Anion gap 10 5 - 15  Comment: Performed at [facility], [place] [address], [place], [place] [address]  CBC     Status: Abnormal   Collection Time: 02/24/19  3:38 AM  Result Value Ref Range    WBC 5.6 4.0 - 10.5 K/uL   RBC 2.80 (L) 3.87 - 5.11 MIL/uL   Hemoglobin 8.2 (L) 12.0 - 15.0 g/dL   HCT 24.6 (L) 36.0 - 46.0 %   MCV 87.9 80.0 - 100.0 fL   MCH 29.3 26.0 - 34.0 pg   MCHC 33.3 30.0 - 36.0 g/dL   RDW 25.9 (H) 11.5 - 15.5 %   Platelets 173 150 - 400 K/uL    Comment: REPEATED TO VERIFY PLATELET COUNT CONFIRMED BY SMEAR    nRBC 0.0 0.0 - 0.2 %    Comment: Performed at [facility], [place] [address] [place], [place] [address]   Ct Head Wo Contrast  Result Date: 02/22/2019 CLINICAL DATA:  Subdural hemorrhage follow-up. EXAM: CT HEAD WITHOUT CONTRAST TECHNIQUE: Contiguous axial images were obtained from the base of the skull through the vertex without intravenous contrast. COMPARISON:  02/22/2019. FINDINGS: Brain: Unchanged, and unusual RIGHT posterior fossa extra-axial hematoma, favored to be subdural, with hematocrit level of dense red cells above which is layering supernatant. Overall thickness is no worse, may be minimally improved, 10-12 mm. There is still mass effect on the fourth ventricle with RIGHT-to-LEFT shift, which remains concerning. In the prepontine cistern, subarachnoid blood is observed, also no worse. No parenchymal hemorrhage or hydrocephalus. Atrophy and small vessel disease. Vascular: Calcification of the cavernous internal carotid arteries consistent with cerebrovascular atherosclerotic disease. No signs of intracranial large vessel occlusion. Skull: Surprisingly, no visible skull fracture or sutural diastasis. No pneumocephalus. Sinuses/Orbits: Unchanged sinuses. BILATERAL orbital varices. Other: None. IMPRESSION: Stable to slightly improved RIGHT posterior fossa extra-axial hematoma, favored to be subdural. Continued surveillance is warranted. Electronically Signed   By: Staci Righter M.D.   On: 02/22/2019 18:23   Ct Head Wo Contrast  Result Date: 02/22/2019 CLINICAL DATA:  Stroke follow-up EXAM: CT HEAD WITHOUT CONTRAST TECHNIQUE: Contiguous axial images were  obtained from the base of the skull through the vertex without intravenous contrast. COMPARISON:  02/18/2019 FINDINGS: Brain: Interval posterior fossa hemorrhage with hematocrit level along the lateral right cerebellum measuring up to 14 mm in thickness. This collection is favored subdural in location, but there is also hemorrhage in the prepontine cistern that appears to be more subarachnoid. There is mass effect on the right cerebellum with fourth ventricular distortion but no hydrocephalus. Chronic small vessel ischemic injury to the supratentorial white matter and caudate nuclei. Vascular: No hyperdense vessel. Skull: Negative Sinuses/Orbits: Bilateral cataract resection. 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 Jugular veins are dilated on prior CTA. There is history of valvular cardiac disease which is the presumed cause. Other: Critical Value/emergent results were called by telephone at the time of interpretation on 02/22/2019 at 11:42 am to Dr. Rosalin Hawking , who verbally acknowledged these results. IMPRESSION: Unusual acute posterior fossa hemorrhage. In the right posterior fossa subdural location is favored and in the prepontine cistern clot appears subarachnoid. There is cerebellar mass effect with patent fourth ventricle. Electronically Signed   By: Monte Fantasia M.D.   On: 02/22/2019 11:50   Dg Chest Port 1 View  Result Date: 02/23/2019 CLINICAL DATA:  Shortness of breath EXAM: PORTABLE CHEST 1 VIEW COMPARISON:  02/22/2019 FINDINGS: Cardiac shadow is enlarged but stable. Pacemaker is again  noted and stable. Small enlarging right pleural effusion is seen. Mild vascular congestion is noted slightly increased from the prior exam. No bony abnormality is noted. IMPRESSION: Mild vascular congestion and small right pleural effusion. Electronically Signed   By: Inez Catalina M.D.   On: 02/23/2019 09:49   Dg Chest Port 1 View  Result Date: 02/22/2019 CLINICAL DATA:   78 year old female with hemoptysis, aspiration. EXAM: PORTABLE CHEST 1 VIEW COMPARISON:  0449 hours today and earlier. FINDINGS: Portable AP semi upright view at 1444 hours. Stable cardiomegaly and mediastinal contours. Stable left chest single lead cardiac pacemaker. Lung volumes and ventilation are stable since 02/17/2019. Cardiomegaly with possible small right pleural effusion. No overt edema. No pneumothorax. No new pulmonary opacity. Paucity of bowel gas in the upper abdomen. IMPRESSION: Stable ventilation since 02/17/2019. No new cardiopulmonary abnormality. Electronically Signed   By: Genevie Ann M.D.   On: 02/22/2019 14:58       Medical Problem List and Plan: 1.  Balance deficits and weakness affecting ADLs and mobility  secondary to cardioembolic stroke complicated by SDH/SAH  Admit to CIR 2.  St Jude's Mitral valve/A fib/ Antithrombotics: -DVT/anticoagulation:  Pharmaceutical: Coumadin   INR subtherapeutic at present  -antiplatelet therapy: low dose ASA 3. Pain Management: tylenol prn.  4. Mood: LCSW to follow for evaluation and support.  Team to provide ego support.   -antipsychotic agents: NA 5. Neuropsych: This patient is not fully capable of making decisions on her own behalf. 6. Skin/Wound Care: routine pressure relief measures.  7. Fluids/Electrolytes/Nutrition: Monitor I/O. Intake improving.  8. CAD/Chronic diastolic CHF: Heart Healthy diet. SOB improving.  Strict I/O's. Continue lasix bid, spironolactone, lopressor with low dose ASA. Filed Weights   02/17/19 1100 02/24/19 0500  Weight: 59 kg 58.8 kg  9. Pneumonitis with hematemesis/Hypoxia: Continue IV protonix. Will monitor for now.   Wean supplemental oxygen as tolerated 10. Chronic A fib/ SSS s/p PPM: Monitor HR tid--continue amiodarone. On coumdin.  Monitor with increased mobility 11. Acute on chronic anemia?:  Check iron panel.  Follow with serial checks. Transfuse if Hgb < 7.0.  Add iron supplement.  12. CKD: Baseline  SCr-1.3? Encourage fluid intake.  BMP ordered for Monday. 13. Sleep disturbance:  Add trazodone prn.    Post Admission Physician Evaluation: 1. Preadmission assessment reviewed and changes made below. 2. Functional deficits secondary  to cardioembolic stroke complicated by SDH/SAH. 3. Patient is admitted to receive collaborative, interdisciplinary care between the physiatrist, rehab nursing staff, and therapy team. 4. Patient's level of medical complexity and substantial therapy needs in context of that medical necessity cannot be provided at a lesser intensity of care such as a SNF. 5. Patient has experienced substantial functional loss from his/her baseline which was documented above under the "Functional History" and "Functional Status" headings.  Judging by the patient's diagnosis, physical exam, and functional history, the patient has potential for functional progress which will result in measurable gains while on inpatient rehab.  These gains will be of substantial and practical use upon discharge  in facilitating mobility and self-care at the household level. 6. Physiatrist will provide 24 hour management of medical needs as well as oversight of the therapy plan/treatment and provide guidance as appropriate regarding the interaction of the two. 7. 24 hour rehab nursing will assist with safety, disease management, medication administration and patient education  and help integrate therapy concepts, techniques,education, etc. 8. PT will assess and treat for/with: Lower extremity strength, range of motion, stamina, balance, functional  mobility, safety, adaptive techniques and equipment, coping skills, pain control, stroke education. Goals are: Supervision. 9. OT will assess and treat for/with: ADL's, functional mobility, safety, upper extremity strength, adaptive techniques and equipment, ego support, and community reintegration.   Goals are: Supervision. Therapy may proceed with showering this  patient. 10. SLP will assess and treat for/with: Cognition.  Goals are: Supervision/mod I. 11. Case Management and Social Worker will assess and treat for psychological issues and discharge planning. 12. Team conference will be held weekly to assess progress toward goals and to determine barriers to discharge. 13. Patient will receive at least 3 hours of therapy per day at least 5 days per week. 14. ELOS: 12-16 days.       15. Prognosis:  good  I have personally performed a face to face diagnostic evaluation, including, but not limited to relevant history and physical exam findings, of this patient and developed relevant assessment and plan.  Additionally, I have reviewed and concur with the physician assistant's documentation above.  Delice Lesch, MD, ABPMR Bary Leriche, PA-C 02/24/2019

## 2019-02-25 ENCOUNTER — Inpatient Hospital Stay (HOSPITAL_COMMUNITY): Payer: Medicare Other | Admitting: Speech Pathology

## 2019-02-25 ENCOUNTER — Inpatient Hospital Stay (HOSPITAL_COMMUNITY): Payer: Medicare Other | Admitting: Occupational Therapy

## 2019-02-25 ENCOUNTER — Inpatient Hospital Stay (HOSPITAL_COMMUNITY): Payer: Medicare Other | Admitting: Physical Therapy

## 2019-02-25 DIAGNOSIS — I63411 Cerebral infarction due to embolism of right middle cerebral artery: Secondary | ICD-10-CM

## 2019-02-25 DIAGNOSIS — I6349 Cerebral infarction due to embolism of other cerebral artery: Secondary | ICD-10-CM

## 2019-02-25 LAB — COMPREHENSIVE METABOLIC PANEL
ALT: 21 U/L (ref 0–44)
AST: 83 U/L — ABNORMAL HIGH (ref 15–41)
Albumin: 2.8 g/dL — ABNORMAL LOW (ref 3.5–5.0)
Alkaline Phosphatase: 102 U/L (ref 38–126)
Anion gap: 9 (ref 5–15)
BUN: 29 mg/dL — ABNORMAL HIGH (ref 8–23)
CO2: 27 mmol/L (ref 22–32)
Calcium: 9.4 mg/dL (ref 8.9–10.3)
Chloride: 99 mmol/L (ref 98–111)
Creatinine, Ser: 1.14 mg/dL — ABNORMAL HIGH (ref 0.44–1.00)
GFR calc Af Amer: 54 mL/min — ABNORMAL LOW (ref >60)
GFR calc non Af Amer: 46 mL/min — ABNORMAL LOW (ref >60)
Glucose, Bld: 96 mg/dL (ref 70–99)
Potassium: 3.3 mmol/L — ABNORMAL LOW (ref 3.5–5.1)
Sodium: 135 mmol/L (ref 135–145)
Total Bilirubin: 1.6 mg/dL — ABNORMAL HIGH (ref 0.3–1.2)
Total Protein: 7.4 g/dL (ref 6.5–8.1)

## 2019-02-25 LAB — CBC WITH DIFFERENTIAL/PLATELET
Abs Immature Granulocytes: 0.02 10*3/uL (ref 0.00–0.07)
Basophils Absolute: 0 10*3/uL (ref 0.0–0.1)
Basophils Relative: 1 %
Eosinophils Absolute: 0.2 10*3/uL (ref 0.0–0.5)
Eosinophils Relative: 3 %
HCT: 26.3 % — ABNORMAL LOW (ref 36.0–46.0)
Hemoglobin: 8.7 g/dL — ABNORMAL LOW (ref 12.0–15.0)
Immature Granulocytes: 0 %
Lymphocytes Relative: 10 %
Lymphs Abs: 0.7 10*3/uL (ref 0.7–4.0)
MCH: 29 pg (ref 26.0–34.0)
MCHC: 33.1 g/dL (ref 30.0–36.0)
MCV: 87.7 fL (ref 80.0–100.0)
Monocytes Absolute: 1.1 10*3/uL — ABNORMAL HIGH (ref 0.1–1.0)
Monocytes Relative: 18 %
Neutro Abs: 4.3 10*3/uL (ref 1.7–7.7)
Neutrophils Relative %: 68 %
Platelets: 207 10*3/uL (ref 150–400)
RBC: 3 MIL/uL — ABNORMAL LOW (ref 3.87–5.11)
RDW: 26.1 % — ABNORMAL HIGH (ref 11.5–15.5)
WBC: 6.3 10*3/uL (ref 4.0–10.5)
nRBC: 0.3 % — ABNORMAL HIGH (ref 0.0–0.2)

## 2019-02-25 LAB — PROTIME-INR
INR: 1.5 — ABNORMAL HIGH (ref 0.8–1.2)
Prothrombin Time: 18.1 seconds — ABNORMAL HIGH (ref 11.4–15.2)

## 2019-02-25 LAB — OCCULT BLOOD X 1 CARD TO LAB, STOOL: Fecal Occult Bld: NEGATIVE

## 2019-02-25 MED ORDER — PANTOPRAZOLE SODIUM 40 MG PO TBEC
40.0000 mg | DELAYED_RELEASE_TABLET | Freq: Every day | ORAL | Status: DC
  Administered 2019-02-26 – 2019-03-07 (×10): 40 mg via ORAL
  Filled 2019-02-25 (×10): qty 1

## 2019-02-25 MED ORDER — WARFARIN SODIUM 5 MG PO TABS
5.0000 mg | ORAL_TABLET | Freq: Once | ORAL | Status: AC
  Administered 2019-02-25: 5 mg via ORAL
  Filled 2019-02-25: qty 1

## 2019-02-25 NOTE — Evaluation (Signed)
Speech Language Pathology Assessment and Plan  Patient Details  Name: Carrie [name] MRN: [id number] Date of Birth: 04-03-1941  EVALUATION ONLY  Today's Date: 02/25/2019 SLP Individual Time: 0730-0830 SLP Individual Time Calculation (min): 60 min   Problem List:  Patient Active Problem List   Diagnosis Date Noted  . 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 Embolic stroke ([place]) 80/11/4915  . Acute right MCA stroke ([place])   . Cerebrovascular accident (CVA) ([place])   . Sleep disturbance   . Acute on chronic anemia   . Subtherapeutic international normalized ratio (INR) 02/22/2019  . Dysphagia following cerebrovascular accident (CVA) 02/22/2019  . Malnutrition ([place]) 02/22/2019  . Encounter for intubation   . Acute respiratory failure ([place])   . 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 CHF exacerbation ([place]) 02/03/2019  . CHF (congestive heart failure) ([place]) 02/02/2019  . SOB (shortness of breath) 02/02/2019  . Diarrhea 03/31/2018  . Abnormal LFTs 03/31/2018  . Hemoptysis 03/30/2018  . Elevated troponin 03/30/2018  . CKD (chronic kidney disease), stage III ([place]) 03/30/2018  . HLD (hyperlipidemia) 03/30/2018  . Blurry vision 03/30/2018  . Status post coronary artery stent placement   . Chronic diastolic heart failure ([place])   . History of mitral valve replacement with mechanical valve   . Hypertensive heart disease with heart failure ([place])   . Pure hypercholesterolemia   . Coronary artery disease involving native coronary artery of native heart without angina pectoris 08/11/2016  . PAH (pulmonary artery hypertension) ([place]) 05/05/2016  . Anemia   . AKI (acute kidney injury) ([place])   . Hyponatremia 03/12/2016  . Chest pain 03/11/2016  . NSTEMI (non-ST elevated myocardial infarction) ([place]) 03/11/2016  . 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 Thrombocytopenia ([place]) 12/03/2014  . SSS (sick  sinus syndrome) ([place]) 07/24/2014  . Longstanding persistent atrial fibrillation 07/24/2014  . Pacemaker 07/24/2014  . Tachycardia, a fib with RVR  04/03/2013  . Essential hypertension 04/03/2013  . SAH (subarachnoid hemorrhage), December 2012 03/21/2012  . Hemorrhage, hepatic, Rx'd with Coumadin reversal and embolisation 03/17/12 03/17/2012  . 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 Chronic anticoagulation, ( INR goal 2.0-2.5 due to history of subdural hematoma and liver hemorrhage) 09/09/2011  . Muscle weakness of lower extremity 09/08/2011  . Pacemaker - single chamber Medtronic Adapta, 2008 09/08/2011  . Atrial fibrillation ([place]) 09/06/2011  . S/P mitral valve replacement, St Jude 09/06/2011  . CONSTIPATION 02/12/2009   Past Medical History:  Past Medical History:  Diagnosis Date  . Anticoagulated on Coumadin    for mech valve and atrial fib  goal 2.0-2.5  . Anxiety   . Arthritis    "right shoulder" (03/15/2013)  . Atrial fibrillation ([place])   . Atrial flutter ([place])   . CAD (coronary artery disease) 08/11/2016  . Chronic combined systolic and diastolic CHF (congestive heart failure) ([place])    a. 02/2016 Echo: EF 45-50%.  . Diverticula of colon   . GERD (gastroesophageal reflux disease)   . Hemorrhage intraabdominal 03/17/2012  . Hyperlipidemia   . Hypertension   . Liver hemorrhage   . Migraines   . 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 Bi-leaflet St. Jude mechanical prosthesis; b. 02/2016 Echo: EF 45-50%, some degree of MR, sev dil LA/RA, sev TR.  . NSTEMI (non-ST elevated myocardial infarction) ([place])    a. 02/2016 elev trop/Cath: nonobs dzs,   . Pacemaker  medtronic adapta  . Severe tricuspid regurgitation    a. 02/2016 Echo: Ef 45-50%, sev TR, PASP 43mHg.  . Sick sinus syndrome ([place]    Dr GCristopher Peru EP study negative. Pacemaker 12/15/06 Medtronic  . Stroke ([facility]    "they say I had a stroke last year" denies residual on 03/15/2013  .  Subdural hematoma ([facility]    Past Surgical History:  Past Surgical History:  Procedure Laterality Date  . APPENDECTOMY    . CARDIAC CATHETERIZATION  04/02/97   R&L:severe MR/pulmonary hypertension  . CARDIAC CATHETERIZATION N/A 03/16/2016   Procedure: Left Heart Cath and Coronary Angiography;  Surgeon: JJettie Booze MD;  [facility];  Service: Cardiovascular;  Laterality: N/A;  . CARDIAC CATHETERIZATION N/A 09/30/2016   Procedure: Left Heart Cath and Coronary Angiography;  Surgeon: MWellington Hampshire MD;  [facility];  Service: Cardiovascular;  Laterality: N/A;  . CARDIAC CATHETERIZATION N/A 09/30/2016   Procedure: Coronary Stent Intervention;  Surgeon: MWellington Hampshire MD;  3.5 x 12 mm resolute Onyx  to ostial RCA  . CATARACT EXTRACTION W/ INTRAOCULAR LENS  IMPLANT, BILATERAL Bilateral 2013  . CORONARY ANGIOPLASTY    . DILATION AND CURETTAGE OF UTERUS     "had fibroids" (03/15/2013)  . EXPLORATORY LAPAROTOMY     "had a growth on my intestines" (03/15/2013)  . HAMMER TOE SURGERY Right   . INSERT / REPLACE / REMOVE PACEMAKER  12/15/2006   Medtronic  . IR ANGIO VERTEBRAL SEL SUBCLAVIAN INNOMINATE UNI R MOD SED  02/17/2019  . IR CT HEAD LTD  02/17/2019  . IR PERCUTANEOUS ART THROMBECTOMY/INFUSION INTRACRANIAL INC DIAG ANGIO  02/17/2019  . MITRAL VALVE REPLACEMENT  1998   St Jude mechanical; Dr. GServando Snare . PACEMAKER PLACEMENT  12/15/06   medtronic adapta for SSS  . PERMANENT PACEMAKER GENERATOR CHANGE N/A 07/24/2014   Procedure: PERMANENT PACEMAKER GENERATOR CHANGE;  Surgeon: MSanda Klein MD;  [facility];  Service: Cardiovascular;  Laterality: N/A;  . PERSANTINE CARDIOLITE  08/07/03   mild inf. ischemia   . RADIOLOGY WITH ANESTHESIA N/A 02/17/2019   Procedure: IR WITH ANESTHESIA;  Surgeon: DLuanne Bras MD;  [place] [facility]  Service: Radiology;  Laterality: N/A;  . RIGHT HEART CATH N/A 02/06/2019   Procedure: RIGHT HEART CATH;  Surgeon:  BJolaine Artist MD;  [facility];  Service: Cardiovascular;  Laterality: N/A;  . TEE WITHOUT CARDIOVERSION  09/23/2011   Procedure: TRANSESOPHAGEAL ECHOCARDIOGRAM (TEE);  Surgeon: KPixie Casino  [facility];  Service: Cardiovascular;  Laterality: N/A;  . TONSILLECTOMY    . TUBAL LIGATION    . [place]HY  11/19/2011   EF 50-55%,RA mod to severely dilated,LA severely dilated,trace MR,small vegetation or mass on the MV,AOV mildly scleroticmild PI, RV pressure 40-547mg    Assessment / Plan / Recommendation Clinical Impression Carrie [name] a [age]ear old female with history of CAF, CAD, St Jude MVR -chronic coumadin, moderate to severe TVR,  SDH,  chronic diastolic CHF with recent admission 5/14-5/19/20 for acute excacerbation, PPM, HTN, CKD who was admitted 02/17/19 with sudden onset of right sided weakness, left facial droop and slurred speech. CTA head/neck done revealing acute occlusion of right M1 brach with delayed collateral flow and marked enlargement of superior bilateral ophthalmic vein.  INR subtherapeutic at admission and she underwent cerebral angio with complete revascularization of R-MCA M1 segment with TICI revascularization. Dr. XuErlinda Hongelt that stroke was cardioembolic  due to subtherapeutic INR in setting of A fib/mechanical valve. She was started on IV heparin and coumadin 5/31. She has had issue with intermittent coughing and developed diffuse wheezing.  CXR negative--continues on supplemental oxygen. Cardiology consulted for input due to reports of progressive SOB for 2-3 weeks PTA and felt that symptoms multifactorial she was briefly treated with IV diuretics.  Hospital course further complicated by acute on chronic anemia, with steady decline. She developed N/V with hematemesis and H/A on 6/3.  Stat CT head reviewed, showing posterior right SDH.  Per report, right posterior fossa SDH with paramedian SAH and heparin reversed with protamine and  Coumadin held.  Dr. Vaughan Browner felt that hemoptysis likely due to pneumonitis from bleeding and anticoagulation. He  questioned development of interstitial lung disease due to amiodarone leading to SOB. Her symptoms are improving and he recommends chest CT if hemoptysis worsens.  Repeat CT head reviewed, showing improvement. GI symptoms and HA have resolved but she continues to have balance deficits and weakness affecting ADLs and mobility. CIR recommended due to functional decline with admission on 02/24/19.    From chart review, it is not clear why pt remains on full liquid diet. Per ST report on 02/23/19, pt was appropriate for upgrade to regular diet textures, thin liquids with medicine whole with thin liquids. No oropharyngeal dysphagia observed and ST signed off. Despite extensive chart review by this wirter and nursing, there is no documentation to indicate decline in ability of rationale for full liquid diet. This Probation officer spoke with attending MD and provided skilled observation of pt consuming regular textures with thin liquids via straw. Pt with no overt s/s of aspiration or dysphagia during consumption. From an oropharyngeal standpoint, pt continues to be appropriate for regular diet with thin liquids. Will consult with MD to assure there are not any medical contraindications for upgrade.   Cognitive linguistic evaluation completed. Pt is alert and orineted x 4, able to initiate self-feeding, demonstrate selective attention to task for 45 minutes, demonstrate good basic problem solving and basic insight into current physical deficits. Pt able to recall medicines and purpose of each as well as course of medical condition/hospital stay as well as follow 2 step verbal directions. At baseline, pt with substantial cardiorespiratory issues that limited mobility and limited full participation in ADLs. This Probation officer spoke with pt's husband via phone and he endorses that pt is at cognitive baseline. Additionally, he  reports that pt required ~ 50% physical assistance for ADLs d/t cardiorespiratory deficits. As such, he performs all household activities. ST to sign off as no acute deficits are identified.    Skilled Therapeutic Interventions          Skilled treatment session focused on completion of above mentioned assessments as well as recommendation for diet upgrade pending MD approval. Education also provided to pt and her husband (via phone) on findings of cognitive linguistic evaluation with endorsement of pt at baseline. During communication and problem solving activities, pt presents with slow deliberate communication/interactions but pt's husband states this is baseline. All questions answered to pt and husband's satisfaction. Interdisciplinary team consulted with no acute evidence of cognitive deficits identified by team.    SLP Assessment  Patient does not need any further Speech Lanaguage Pathology Services    Recommendations  SLP Diet Recommendations: Age appropriate regular solids;Thin Liquid Administration via: Cup;Straw Medication Administration: Whole meds with liquid Supervision: Patient able to self feed Compensations: Slow rate;Small sips/bites;Minimize environmental distractions Postural Changes and/or Swallow Maneuvers: Seated  upright 90 degrees;Upright 30-60 min after meal Oral Care Recommendations: Oral care BID Patient destination: Home Follow up Recommendations: None Equipment Recommended: None recommended by SLP           Pain    Prior Functioning Cognitive/Linguistic Baseline: Within functional limits Type of Home: House  Lives With: Spouse Available Help at Discharge: Family;Available 24 hours/day Vocation: Retired  Industrial/product designer Term Goals: No short term Landscape architect to [place] for [place]  Recommendations for other services: None   Discharge Criteria: Patient will be discharged from SLP if patient refuses treatment 3 consecutive times without medical  reason, if treatment goals not met, if there is a change in medical status, if patient makes no progress towards goals or if patient is discharged from hospital.  The above assessment, treatment plan, treatment alternatives and goals were discussed and mutually agreed upon: by patient and by family  Kayston Jodoin 02/25/2019, 10:33 AM

## 2019-02-25 NOTE — Progress Notes (Signed)
Pt admitted to room 4W23 from 3W. Pt denied pain or discomfort at time of admission. Oriented to floor, room, call bell, and no fall policy. Pt stable at time of admission.   Gerald Stabs, RN

## 2019-02-25 NOTE — Progress Notes (Signed)
[place] for warfarin Indication: atrial fibrillation and mechanical valve  Allergies  Allergen Reactions  . Codeine Nausea And Vomiting    Patient Measurements: Height: 5\' 1"  (154.9 cm) Weight: 118 lb 2.7 oz (53.6 kg) IBW/kg (Calculated) : 47.8  Vital Signs: Temp: 98.7 F (37.1 C) (06/06 1554) Temp Source: Oral (06/06 1554) BP: 113/54 (06/06 1554) Pulse Rate: 61 (06/06 1554)  Labs: Recent Labs    02/23/19 0539 02/24/19 0338 02/25/19 0547  HGB 8.5* 8.2* 8.7*  HCT 25.4* 24.6* 26.3*  PLT 176 173 207  LABPROT 18.5* 18.7* 18.1*  INR 1.6* 1.6* 1.5*  CREATININE 1.21* 1.19* 1.14*    Estimated Creatinine Clearance: 31.2 mL/min (A) (by C-G formula based on SCr of 1.14 mg/dL (H)).  Assessment: 22 yof s/p MCA M1 occlusion s/p IR revascularization. She is on chronic warfarin for afib and mechanical valve. INR on admission was subtherapeutic at 1.9 so she was initiated on IV heparin as a bridge back to therapeutic warfarin. Unfortunately she developed an SDH and SAH. Heparin was reversed and warfarin held.   Warfarin resumed on 6/5 with new INR goal of 2-2.5.   INR = 1.5 today CBC: Hgb 8.7 low/stable, Pltc WNL  Goal of Therapy:  INR 2-2.5 Monitor platelets by anticoagulation protocol: Yes   Plan:  Warfarin 5mg  PO x 1 tonight Daily INR *Anticipate discharge warfarin regimen will need to be adjusted to accommodate new INR goal  Lindell Spar, PharmD, BCPS Please see AMION for all pharmacy numbers 02/25/2019 5:44 PM

## 2019-02-25 NOTE — Evaluation (Signed)
Physical Therapy Assessment and Plan  Patient Details  Name: Carrie [name] MRN: [id number] Date of Birth: 06-15-1941  PT Diagnosis: Abnormal posture, Abnormality of gait, Cognitive deficits, Difficulty walking, Edema, Hemiparesis non-dominant, Impaired cognition, Muscle weakness and Pain in head Rehab Potential: Good ELOS: 8-10 days   Today's Date: 02/25/2019 PT Individual Time: 0623-7628 PT Individual Time Calculation (min): 61 min    Problem List:  Patient Active Problem List   Diagnosis Date Noted  . 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 Embolic stroke ([place]) 31/51/7616  . Acute right MCA stroke ([place])   . Cerebrovascular accident (CVA) ([place])   . Sleep disturbance   . Acute on chronic anemia   . Subtherapeutic international normalized ratio (INR) 02/22/2019  . Dysphagia following cerebrovascular accident (CVA) 02/22/2019  . Malnutrition ([place]) 02/22/2019  . Encounter for intubation   . Acute respiratory failure ([place])   . 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 CHF exacerbation ([place]) 02/03/2019  . CHF (congestive heart failure) ([place]) 02/02/2019  . SOB (shortness of breath) 02/02/2019  . Diarrhea 03/31/2018  . Abnormal LFTs 03/31/2018  . Hemoptysis 03/30/2018  . Elevated troponin 03/30/2018  . CKD (chronic kidney disease), stage III ([place]) 03/30/2018  . HLD (hyperlipidemia) 03/30/2018  . Blurry vision 03/30/2018  . Status post coronary artery stent placement   . Chronic diastolic heart failure ([place])   . History of mitral valve replacement with mechanical valve   . Hypertensive heart disease with heart failure ([place])   . Pure hypercholesterolemia   . Coronary artery disease involving native coronary artery of native heart without angina pectoris 08/11/2016  . PAH (pulmonary artery hypertension) ([place]) 05/05/2016  . Anemia   . AKI (acute kidney injury) ([place])   . Hyponatremia 03/12/2016  . Chest pain  03/11/2016  . NSTEMI (non-ST elevated myocardial infarction) ([place]) 03/11/2016  . 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 Thrombocytopenia ([place]) 12/03/2014  . SSS (sick sinus syndrome) ([place]) 07/24/2014  . Longstanding persistent atrial fibrillation 07/24/2014  . Pacemaker 07/24/2014  . Tachycardia, a fib with RVR  04/03/2013  . Essential hypertension 04/03/2013  . SAH (subarachnoid hemorrhage), December 2012 03/21/2012  . Hemorrhage, hepatic, Rx'd with Coumadin reversal and embolisation 03/17/12 03/17/2012  . 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 Chronic anticoagulation, ( INR goal 2.0-2.5 due to history of subdural hematoma and liver hemorrhage) 09/09/2011  . Muscle weakness of lower extremity 09/08/2011  . Pacemaker - single chamber Medtronic Adapta, 2008 09/08/2011  . Atrial fibrillation ([place]) 09/06/2011  . S/P mitral valve replacement, St Jude 09/06/2011  . CONSTIPATION 02/12/2009    Past Medical History:  Past Medical History:  Diagnosis Date  . Anticoagulated on Coumadin    for mech valve and atrial fib  goal 2.0-2.5  . Anxiety   . Arthritis    "right shoulder" (03/15/2013)  . Atrial fibrillation ([place])   . Atrial flutter ([place])   . CAD (coronary artery disease) 08/11/2016  . Chronic combined systolic and diastolic CHF (congestive heart failure) ([place])    a. 02/2016 Echo: EF 45-50%.  . Diverticula of colon   . GERD (gastroesophageal reflux disease)   . Hemorrhage intraabdominal 03/17/2012  . Hyperlipidemia   . Hypertension   . Liver hemorrhage   . Migraines   . 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 Bi-leaflet St. Jude mechanical prosthesis; b. 02/2016 Echo: EF 45-50%, some degree of MR, sev  dil LA/RA, sev TR.  . NSTEMI (non-ST elevated myocardial infarction) ([place])    a. 02/2016 elev trop/Cath: nonobs dzs,   . Pacemaker    medtronic adapta  . Severe tricuspid regurgitation    a. 02/2016 Echo: Ef 45-50%, sev TR, PASP  18mHg.  . Sick sinus syndrome ([place]    Dr GCristopher Peru EP study negative. Pacemaker 12/15/06 Medtronic  . Stroke ([facility]    "they say I had a stroke last year" denies residual on 03/15/2013  . Subdural hematoma ([facility]    Past Surgical History:  Past Surgical History:  Procedure Laterality Date  . APPENDECTOMY    . CARDIAC CATHETERIZATION  04/02/97   R&L:severe MR/pulmonary hypertension  . CARDIAC CATHETERIZATION N/A 03/16/2016   Procedure: Left Heart Cath and Coronary Angiography;  Surgeon: JJettie Booze MD;  [facility];  Service: Cardiovascular;  Laterality: N/A;  . CARDIAC CATHETERIZATION N/A 09/30/2016   Procedure: Left Heart Cath and Coronary Angiography;  Surgeon: MWellington Hampshire MD;  [facility];  Service: Cardiovascular;  Laterality: N/A;  . CARDIAC CATHETERIZATION N/A 09/30/2016   Procedure: Coronary Stent Intervention;  Surgeon: MWellington Hampshire MD;  3.5 x 12 mm resolute Onyx  to ostial RCA  . CATARACT EXTRACTION W/ INTRAOCULAR LENS  IMPLANT, BILATERAL Bilateral 2013  . CORONARY ANGIOPLASTY    . DILATION AND CURETTAGE OF UTERUS     "had fibroids" (03/15/2013)  . EXPLORATORY LAPAROTOMY     "had a growth on my intestines" (03/15/2013)  . HAMMER TOE SURGERY Right   . INSERT / REPLACE / REMOVE PACEMAKER  12/15/2006   Medtronic  . IR ANGIO VERTEBRAL SEL SUBCLAVIAN INNOMINATE UNI R MOD SED  02/17/2019  . IR CT HEAD LTD  02/17/2019  . IR PERCUTANEOUS ART THROMBECTOMY/INFUSION INTRACRANIAL INC DIAG ANGIO  02/17/2019  . MITRAL VALVE REPLACEMENT  1998   St Jude mechanical; Dr. GServando Snare . PACEMAKER PLACEMENT  12/15/06   medtronic adapta for SSS  . PERMANENT PACEMAKER GENERATOR CHANGE N/A 07/24/2014   Procedure: PERMANENT PACEMAKER GENERATOR CHANGE;  Surgeon: MSanda Klein MD;  [facility];  Service: Cardiovascular;  Laterality: N/A;  . PERSANTINE CARDIOLITE  08/07/03   mild inf. ischemia   . RADIOLOGY WITH ANESTHESIA N/A 02/17/2019    Procedure: IR WITH ANESTHESIA;  Surgeon: DLuanne Bras MD;  [place] [facility]  Service: Radiology;  Laterality: N/A;  . RIGHT HEART CATH N/A 02/06/2019   Procedure: RIGHT HEART CATH;  Surgeon: BJolaine Artist MD;  [facility];  Service: Cardiovascular;  Laterality: N/A;  . TEE WITHOUT CARDIOVERSION  09/23/2011   Procedure: TRANSESOPHAGEAL ECHOCARDIOGRAM (TEE);  Surgeon: KPixie Casino  [facility];  Service: Cardiovascular;  Laterality: N/A;  . TONSILLECTOMY    . TUBAL LIGATION    . [place]ARDIOGRAPHY  11/19/2011   EF 50-55%,RA mod to severely dilated,LA severely dilated,trace MR,small vegetation or mass on the MV,AOV mildly scleroticmild PI, RV pressure 40-557mg    Assessment & Plan Clinical Impression: Patient is a [age].o. year old female with history of CAF, CAD, St Jude MVR -chronic coumadin, moderate to severe TVR,  SDH,  chronic diastolic CHF with recent admission 5/14-5/19/20 for acute excacerbation, PPM, HTN, CKD who was admitted 02/17/19 with sudden onset of right sided weakness, left facial droop and slurred speech.  History taken from chart review and patient.  CTA head/neck done revealing acute occlusion of right M1 brach with delayed collateral flow and  marked enlargement of superior bilateral ophthalmic vein.  INR subtherapeutic at admission and she underwent cerebral angio with complete revascularization of R-MCA M1 segment with TICI revascularization. Dr. Erlinda Hong felt that stroke was cardioembolic due to subtherapeutic INR in setting of A fib/mechanical valve. She was started on IV heparin and coumadin 5/31.  She has had issue with intermittent coughing and developed diffuse wheezing.  CXR negative--continues on supplemental oxygen. Cardiology consulted for input due to reports of progressive SOB for 2-3 weeks PTA and felt that symptoms multifactorial she was briefly treated with IV diuretics.  Hospital course further complicated by acute on chronic  anemia, with steady decline. She developed N/V with hematemesis and H/A on 6/3.  Stat CT head reviewed, showing posterior right SDH.  Per report, right posterior fossa SDH with paramedian SAH and heparin reversed with protamine and Coumadin held.  Dr. Vaughan Browner felt that hemoptysis likely due to pneumonitis from bleeding and anticoagulation. He  questioned development of interstitial lung disease due to amiodarone leading to SOB. Her symptoms are improving and he recommends chest CT if hemoptysis worsens.  CT head reviewed, showing improvement.  Coumadin/ASA to be resumed with INR goal 2.0-2.5.  GI symptoms and HA have resolved but she continues to have balance deficits and weakness affecting ADLs and mobility. CIR recommended due to functional decline. Patient transferred to CIR on 02/24/2019 .   Patient currently requires min with mobility secondary to muscle weakness, decreased cardiorespiratoy endurance, impaired timing and sequencing, unbalanced muscle activation and decreased motor planning, decreased attention to left, decreased initiation, decreased attention, decreased awareness, decreased problem solving, decreased safety awareness and delayed processing and decreased sitting balance, decreased standing balance, decreased postural control and decreased balance strategies.  Prior to hospitalization, patient was min with mobility and lived with Spouse(James) in a House home.  Home access is 2STEStairs to enter.  Patient will benefit from skilled PT intervention to maximize safe functional mobility, minimize fall risk and decrease caregiver burden for planned discharge home with 24 hour assist.  Anticipate patient will benefit from follow up [facility] at discharge.  PT - End of Session Activity Tolerance: Tolerates 30+ min activity with multiple rests Endurance Deficit: Yes Endurance Deficit Description: requires seated rest breaks PT Assessment Rehab Potential (ACUTE/IP ONLY): Good PT Barriers to Discharge:  [place] home environment;Home environment access/layout;Other (comments) PT Barriers to Discharge Comments: impaired awareness of LOB PT Patient demonstrates impairments in the following area(s): Balance;Pain;Perception;Safety;Motor;Endurance;Edema PT Transfers Functional Problem(s): Bed Mobility;Bed to Chair;Car;Furniture PT Locomotion Functional Problem(s): Ambulation;Stairs;Wheelchair Mobility PT Plan PT Intensity: Minimum of 1-2 x/day ,45 to 90 minutes PT Frequency: 5 out of 7 days PT Duration Estimated Length of Stay: 8-10 days PT Treatment/Interventions: Ambulation/gait training;Cognitive remediation/compensation;Discharge planning;DME/adaptive equipment instruction;Functional mobility training;Pain management;Psychosocial support;Splinting/orthotics;Therapeutic Activities;UE/LE Strength taining/ROM;Visual/perceptual remediation/compensation;Balance/vestibular training;Community reintegration;Disease management/prevention;Functional electrical stimulation;Neuromuscular re-education;Patient/family education;Skin care/wound management;Stair training;Therapeutic Exercise;UE/LE Coordination activities;Wheelchair propulsion/positioning PT Transfers Anticipated Outcome(s): supervision PT Locomotion Anticipated Outcome(s): CGA ambulating with LRAD PT Recommendation Recommendations for Other Services: Therapeutic Recreation consult Therapeutic Recreation Interventions: Pet therapy;Kitchen group Follow Up Recommendations: Home health PT;24 hour supervision/assistance Patient destination: Home Equipment Recommended: To be determined  Skilled Therapeutic Intervention Evaluation completed (see details above and below) with education on PT POC and goals and individual treatment initiated with focus on transfers, gait training, stair navigation, and activity tolerance as well as education regarding daily therapy schedule, weekly team meetings, purpose of PT evaluation, and other CIR information. Pt  received sitting in w/c finishing lunch and agreeable to therapy session. Transported to/from  gym in w/c. Sit<>stand with min assist for balance due to intermittent posterior lean. Ambulated 69f using RW with min assist for balance and AD management - pt demonstrating significantly decreased gait speed, poor B LE foot clearance, decreased B LE step length. When turning to sit in chair pt has posterior LOB requiring max assist to pivot hips and sit in chair - pt demonstrates no protective response or attempts to regain balance. Pt demonstrates significanlty impaired processing and impaired motor planning requiring significantly increased time to perform mobility tasks as well as very slow movements when performing tasks. While ambulating pt reports onset of R sided headache with no additional details provided - provided seated rest break for pain management. Ascended/descended 4 (6" height) steps using bilateral handrails with min assist for balance with step-to gait pattern and cuing throughout for sequencing. Transported back to room in w/c and pt left sitting in w/c with needs in reach and seat belt alarm on.   PT Evaluation Precautions/Restrictions Precautions Precautions: Fall Restrictions Weight Bearing Restrictions: No Pain Pain Assessment Pain Scale: 0-10 Pain Score: 0-No pain(reports she currently has no pain but that sometimes she has "real bad" headache pain on the right) Pain Intervention(s): Other (Comment)(therapy to tolerance)  During ambulation pt reports onset of R sided headache - no further details provided. Home Living/Prior Functioning Home Living Available Help at Discharge: Family;Available 24 hours/day Type of Home: House Home Access: Stairs to enter [employer]enter[employer] Steps: 2STE Entrance Stairs-Rails: None Home Layout: Multi-level;1/2 bath on main level Alternate Level Stairs-Number of Steps: 7 steps to 2nd floor - pt reports she is having a chair lift put in  but is unable to recall if there are handrails on the steps Alternate Level Stairs-Rails: Right Additional Comments: Pt reports residing on 1st level of home PTA. There is a walker accessible half bath where she'd use the toilet. She would complete sponge bathing at the kitchen sink and use SPC for mobility. Spouse provided A for ambulation and self care PTA  Lives With: Spouse(James) Prior Function Level of Independence: Needs assistance with ADLs;Needs assistance with homemaking;Needs assistance with gait;Needs assistance with tranfers(ambulates with SPC and assistance from husband)  Able to Take Stairs?: Yes(with assistance) Driving: No Vision/Perception  Perception Perception: Impaired Inattention/Neglect: Other (comment)(noted decreased attention to L UE support on handrail when performing stair navigation) Praxis Praxis: Intact  Cognition Overall Cognitive Status: No family/caregiver present to determine baseline cognitive functioning Arousal/Alertness: Awake/alert Orientation Level: Oriented X4 Attention: Focused;Sustained Focused Attention: Appears intact Sustained Attention: Impaired(becomes easily distracted during functional mobility ) Awareness: Impaired(pt has posterior LOB when turning to sit in chair and demonstrates no awareness of this) Safety/Judgment: Impaired(decreased awareness of balance impairments) Sensation Sensation Light Touch: Appears Intact(B LEs) Hot/Cold: Not tested Proprioception: Appears Intact Stereognosis: Not tested Coordination Gross Motor Movements are Fluid and Coordinated: No Fine Motor Movements are Fluid and Coordinated: No Coordination and Movement Description: Slow and deliberate Finger Nose Finger Test: Intact bilaterally  Motor  Motor Motor: Other (comment) Motor - Skilled Clinical Observations: generalized weakness  Mobility Transfers Transfers: Sit to Stand;Stand to Sit;Stand Pivot Transfers Sit to Stand: Minimal Assistance -  Patient > 75% Stand to Sit: Minimal Assistance - Patient > 75% Stand Pivot Transfers: Minimal Assistance - Patient > 75%;Contact Guard/Touching assist Stand Pivot Transfer Details: Tactile cues for sequencing;Visual cues for safe use of DME/AE;Verbal cues for technique;Verbal cues for gait pattern;Verbal cues for precautions/safety;Verbal cues for safe use of DME/AE Transfer (Assistive device): Rolling walker Locomotion  Gait Ambulation: Yes Gait Assistance: Minimal Assistance - Patient > 75% Gait Distance (Feet): 74 Feet Assistive device: Rolling walker Gait Assistance Details: Tactile cues for initiation;Tactile cues for weight shifting;Verbal cues for technique;Verbal cues for gait pattern;Verbal cues for safe use of DME/AE;Verbal cues for sequencing Gait Gait: Yes Gait Pattern: Impaired Gait Pattern: Decreased step length - right;Decreased step length - left;Decreased stride length;Poor foot clearance - left;Poor foot clearance - right;Decreased hip/knee flexion - left;Decreased hip/knee flexion - right Gait velocity: significantly decreased  Stairs / Additional Locomotion Stairs: Yes Stairs Assistance: Minimal Assistance - Patient > 75% Stair Management Technique: Two rails Number of Stairs: 4 Height of Stairs: 6 Wheelchair Mobility Wheelchair Mobility: No  Trunk/Postural Assessment  Cervical Assessment Cervical Assessment: Within Functional Limits Thoracic Assessment Thoracic Assessment: Within Functional Limits Lumbar Assessment Lumbar Assessment: Exceptions to WFL(posterior pelvic tilt) Postural Control Postural Control: Deficits on evaluation Righting Reactions: absent Protective Responses: absent Postural Limitations: decreased  Balance Balance Balance Assessed: Yes Dynamic Sitting Balance Dynamic Sitting - Balance Support: During functional activity;Feet supported Dynamic Sitting - Level of Assistance: 4: Min assist Static Standing Balance Static Standing -  Balance Support: During functional activity;Bilateral upper extremity supported Static Standing - Level of Assistance: 4: Min assist Dynamic Standing Balance Dynamic Standing - Balance Support: During functional activity;Bilateral upper extremity supported Dynamic Standing - Level of Assistance: 4: Min assist Extremity Assessment  RLE Assessment RLE Assessment: Exceptions to [facility] RLE Strength Right Hip Flexion: 4-/5 Right Knee Flexion: 4/5 Right Knee Extension: 4/5 Right Ankle Dorsiflexion: 4/5 Right Ankle Plantar Flexion: 4/5 LLE Assessment LLE Assessment: Exceptions to [facility] LLE Strength Left Hip Flexion: 3+/5 Left Knee Flexion: 4-/5 Left Knee Extension: 4/5 Left Ankle Dorsiflexion: 4-/5 Left Ankle Plantar Flexion: 4-/5    Refer to Care Plan for Long Term Goals  Recommendations for other services: Therapeutic Recreation  Pet therapy and Kitchen group  Discharge Criteria: Patient will be discharged from PT if patient refuses treatment 3 consecutive times without medical reason, if treatment goals not met, if there is a change in medical status, if patient makes no progress towards goals or if patient is discharged from hospital.  The above assessment, treatment plan, treatment alternatives and goals were discussed and mutually agreed upon: by patient  Tawana Scale, PT, DPT 02/25/2019, 1:24 PM

## 2019-02-25 NOTE — Progress Notes (Signed)
Carrie [name] is a 78 y.o. female admitted for CIR yesterday with functional deficits following a cardioembolic stroke complicated by SDH/SAH.  Past Medical History:  Diagnosis Date  . Anticoagulated on Coumadin    for mech valve and atrial fib  goal 2.0-2.5  . Anxiety   . Arthritis    "right shoulder" (03/15/2013)  . Atrial fibrillation ([place])   . Atrial flutter ([place])   . CAD (coronary artery disease) 08/11/2016  . Chronic combined systolic and diastolic CHF (congestive heart failure) ([place])    a. 02/2016 Echo: EF 45-50%.  . Diverticula of colon   . GERD (gastroesophageal reflux disease)   . Hemorrhage intraabdominal 03/17/2012  . Hyperlipidemia   . Hypertension   . Liver hemorrhage   . Migraines   . 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 Bi-leaflet St. Jude mechanical prosthesis; b. 02/2016 Echo: EF 45-50%, some degree of MR, sev dil LA/RA, sev TR.  . NSTEMI (non-ST elevated myocardial infarction) ([place])    a. 02/2016 elev trop/Cath: nonobs dzs,   . Pacemaker    medtronic adapta  . Severe tricuspid regurgitation    a. 02/2016 Echo: Ef 45-50%, sev TR, PASP 108mmHg.  . Sick sinus syndrome ([place])    Dr Cristopher Peru. EP study negative. Pacemaker 12/15/06 Medtronic  . Stroke [facility])    "they say I had a stroke last year" denies residual on 03/15/2013  . Subdural hematoma (HCC)      Subjective: No new complaints. No new problems. Slept well. Feeling OK.  Objective: Vital signs in last 24 hours: Temp:  [97.7 F (36.5 C)-98.5 F (36.9 C)] 98.5 F (36.9 C) (06/06 0355) Pulse Rate:  [67-97] 69 (06/06 0355) Resp:  [16-20] 20 (06/06 0355) BP: (111-124)/(57-69) 111/69 (06/06 0355) SpO2:  [92 %-100 %] 93 % (06/06 0355) Weight:  [53.3 kg-53.6 kg] 53.6 kg (06/06 0500) Weight change:  Last BM Date: 02/24/19  Intake/Output from previous day: 06/05 0701 - 06/06 0700 In: 354 [P.O.:354] Out: 400 [Urine:400] Last cbgs: CBG (last 3)  Recent Labs    02/22/19 1623  GLUCAP 121*    Patient Vitals for the past 24 hrs:  BP Temp Temp src Pulse Resp SpO2 Height Weight  02/25/19 0500 - - - - - - - 53.6 kg  02/25/19 0355 111/69 98.5 F (36.9 C) Oral 69 20 93 % - -  02/24/19 1936 119/66 97.7 F (36.5 C) Oral 97 18 96 % - -  02/24/19 1555 113/62 98.1 F (36.7 C) Oral 67 17 92 % - -  02/24/19 1526 - - - - - - 5\' 1"  (1.549 m) 53.3 kg       Physical Exam General: No apparent distress;  sitting up in chair HEENT: not dry Lungs: Normal effort. Lungs clear to auscultation, no crackles or wheezes. Cardiovascular: Rhythm irregular.  Grade 3/6 systolic murmur Abdomen: S/NT/ND; BS(+) Musculoskeletal:  unchanged Neurological: No new neurological deficits Wounds: N/A    Skin: clear IV site left arm Mental state: Alert, oriented, cooperative    Lab Results: BMET    Component Value Date/Time   NA 135 02/25/2019 0547   NA 142 01/25/2019 1145   NA 139 12/03/2014 0959   K 3.3 (L) 02/25/2019 0547   K 3.9 12/03/2014 0959   CL 99 02/25/2019 0547   CL 100 03/14/2010 1318   CO2 27 02/25/2019 0547   CO2 26 12/03/2014 0959   GLUCOSE 96 02/25/2019 0547   GLUCOSE 77 12/03/2014 0959  GLUCOSE 101 03/14/2010 1318   BUN 29 (H) 02/25/2019 0547   BUN 38 (H) 01/25/2019 1145   BUN 18.7 12/03/2014 0959   CREATININE 1.14 (H) 02/25/2019 0547   CREATININE 0.95 (H) 10/27/2016 1546   CREATININE 0.9 12/03/2014 0959   CALCIUM 9.4 02/25/2019 0547   CALCIUM 10.5 (H) 12/03/2014 0959   GFRNONAA 46 (L) 02/25/2019 0547   GFRAA 54 (L) 02/25/2019 0547   CBC    Component Value Date/Time   WBC 6.3 02/25/2019 0547   RBC 3.00 (L) 02/25/2019 0547   HGB 8.7 (L) 02/25/2019 0547   HGB 9.6 (L) 12/17/2016 1057   HCT 26.3 (L) 02/25/2019 0547   HCT 29.2 (L) 12/17/2016 1057   PLT 207 02/25/2019 0547   PLT 158 12/17/2016 1057   MCV 87.7 02/25/2019 0547   MCV 84.9 12/17/2016 1057   MCH 29.0 02/25/2019 0547   MCHC 33.1 02/25/2019 0547   RDW 26.1 (H) 02/25/2019 0547   RDW 28.8 (H) 12/17/2016  1057   LYMPHSABS 0.7 02/25/2019 0547   LYMPHSABS 0.8 (L) 12/17/2016 1057   MONOABS 1.1 (H) 02/25/2019 0547   MONOABS 0.7 12/17/2016 1057   EOSABS 0.2 02/25/2019 0547   EOSABS 0.1 12/17/2016 1057   EOSABS 0.2 03/14/2010 1318   BASOSABS 0.0 02/25/2019 0547   BASOSABS 0.1 12/17/2016 1057   Lab Results  Component Value Date   INR 1.5 (H) 02/25/2019   INR 1.6 (H) 02/24/2019   INR 1.6 (H) 02/23/2019      Studies/Results: Ct Head Wo Contrast  Result Date: 02/24/2019 CLINICAL DATA:  Followup subdural hemorrhage EXAM: CT HEAD WITHOUT CONTRAST TECHNIQUE: Contiguous axial images were obtained from the base of the skull through the vertex without intravenous contrast. COMPARISON:  02/22/2019 FINDINGS: Brain: There is diminishing size in density a presumably subdural hematoma along the floor of the posterior cranial fossa on the right. Thickness presently is approximately 5 mm. No significant mass effect. No fourth ventricular compromise. No sign of acute infarction. Atrophy and chronic small-vessel ischemic changes throughout the brain as seen previously. Small amount of blood dependent in the occipital horns of the lateral ventricles. No hydrocephalus. Vascular: There is atherosclerotic calcification of the major vessels at the base of the brain. Skull: Negative Sinuses/Orbits: Mucosal inflammation of the right maxillary sinus. Other sinuses are clear. Orbits show chronic venous varices on each side. Other: None IMPRESSION: The extra-axial hematoma along the floor of the posterior fossa on the right is becoming smaller and less dense. Maximal thickness measured at 5 mm presently. No significant mass effect. Small amount of blood dependent in the occipital horns of the lateral ventricles. No hydrocephalus. Electronically Signed   By: Nelson Chimes M.D.   On: 02/24/2019 10:09    Medications: I have reviewed the patient's current medications.  Assessment/Plan:  Functional deficits with weakness and  unsteady gait following cardioembolic stroke.  Continue CIR.  Advance diet History of chronic atrial fibrillation/status post St. Jude's MVR.  Continue to titrate Coumadin therapy CAD/chronic diastolic heart failure.  Stable    Length of stay, days: 1  Marletta Lor , MD 02/25/2019, 10:54 AM

## 2019-02-25 NOTE — Evaluation (Signed)
Occupational Therapy Assessment and Plan  Patient Details  Name: DEMETRIS MEINHARDT MRN: [id number] Date of Birth: 1941/01/31  OT Diagnosis: abnormal posture and muscle weakness (generalized) Rehab Potential: Rehab Potential (ACUTE ONLY): Excellent ELOS: 8-10 days   Today's Date: 02/25/2019 OT Individual Time: 0900-1000 OT Individual Time Calculation (min): 60 min     Problem List:  Patient Active Problem List   Diagnosis Date Noted  . 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 Embolic stroke ([place]) 29/52/8413  . Acute right MCA stroke ([place])   . Cerebrovascular accident (CVA) ([place])   . Sleep disturbance   . Acute on chronic anemia   . Subtherapeutic international normalized ratio (INR) 02/22/2019  . Dysphagia following cerebrovascular accident (CVA) 02/22/2019  . Malnutrition ([place]) 02/22/2019  . Encounter for intubation   . Acute respiratory failure ([place])   . 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 CHF exacerbation ([place]) 02/03/2019  . CHF (congestive heart failure) ([place]) 02/02/2019  . SOB (shortness of breath) 02/02/2019  . Diarrhea 03/31/2018  . Abnormal LFTs 03/31/2018  . Hemoptysis 03/30/2018  . Elevated troponin 03/30/2018  . CKD (chronic kidney disease), stage III ([place]) 03/30/2018  . HLD (hyperlipidemia) 03/30/2018  . Blurry vision 03/30/2018  . Status post coronary artery stent placement   . Chronic diastolic heart failure ([place])   . History of mitral valve replacement with mechanical valve   . Hypertensive heart disease with heart failure ([place])   . Pure hypercholesterolemia   . Coronary artery disease involving native coronary artery of native heart without angina pectoris 08/11/2016  . PAH (pulmonary artery hypertension) ([place]) 05/05/2016  . Anemia   . AKI (acute kidney injury) ([place])   . Hyponatremia 03/12/2016  . Chest pain 03/11/2016  . NSTEMI (non-ST elevated myocardial infarction) ([place])  03/11/2016  . 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 Thrombocytopenia ([place]) 12/03/2014  . SSS (sick sinus syndrome) ([place]) 07/24/2014  . Longstanding persistent atrial fibrillation 07/24/2014  . Pacemaker 07/24/2014  . Tachycardia, a fib with RVR  04/03/2013  . Essential hypertension 04/03/2013  . SAH (subarachnoid hemorrhage), December 2012 03/21/2012  . Hemorrhage, hepatic, Rx'd with Coumadin reversal and embolisation 03/17/12 03/17/2012  . 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 Chronic anticoagulation, ( INR goal 2.0-2.5 due to history of subdural hematoma and liver hemorrhage) 09/09/2011  . Muscle weakness of lower extremity 09/08/2011  . Pacemaker - single chamber Medtronic Adapta, 2008 09/08/2011  . Atrial fibrillation ([place]) 09/06/2011  . S/P mitral valve replacement, St Jude 09/06/2011  . CONSTIPATION 02/12/2009    Past Medical History:  Past Medical History:  Diagnosis Date  . Anticoagulated on Coumadin    for mech valve and atrial fib  goal 2.0-2.5  . Anxiety   . Arthritis    "right shoulder" (03/15/2013)  . Atrial fibrillation ([place])   . Atrial flutter ([place])   . CAD (coronary artery disease) 08/11/2016  . Chronic combined systolic and diastolic CHF (congestive heart failure) ([place])    a. 02/2016 Echo: EF 45-50%.  . Diverticula of colon   . GERD (gastroesophageal reflux disease)   . Hemorrhage intraabdominal 03/17/2012  . Hyperlipidemia   . Hypertension   . Liver hemorrhage   . Migraines   . 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 Bi-leaflet St. Jude mechanical prosthesis; b. 02/2016 Echo: EF 45-50%, some degree of MR, sev dil LA/RA, sev TR.  . NSTEMI (non-ST elevated  myocardial infarction) ([place])    a. 02/2016 elev trop/Cath: nonobs dzs,   . Pacemaker    medtronic adapta  . Severe tricuspid regurgitation    a. 02/2016 Echo: Ef 45-50%, sev TR, PASP 28mHg.  . Sick sinus syndrome ([place]    Dr GCristopher Peru EP study  negative. Pacemaker 12/15/06 Medtronic  . Stroke ([facility]    "they say I had a stroke last year" denies residual on 03/15/2013  . Subdural hematoma ([facility]    Past Surgical History:  Past Surgical History:  Procedure Laterality Date  . APPENDECTOMY    . CARDIAC CATHETERIZATION  04/02/97   R&L:severe MR/pulmonary hypertension  . CARDIAC CATHETERIZATION N/A 03/16/2016   Procedure: Left Heart Cath and Coronary Angiography;  Surgeon: JJettie Booze MD;  [facility];  Service: Cardiovascular;  Laterality: N/A;  . CARDIAC CATHETERIZATION N/A 09/30/2016   Procedure: Left Heart Cath and Coronary Angiography;  Surgeon: MWellington Hampshire MD;  [facility];  Service: Cardiovascular;  Laterality: N/A;  . CARDIAC CATHETERIZATION N/A 09/30/2016   Procedure: Coronary Stent Intervention;  Surgeon: MWellington Hampshire MD;  3.5 x 12 mm resolute Onyx  to ostial RCA  . CATARACT EXTRACTION W/ INTRAOCULAR LENS  IMPLANT, BILATERAL Bilateral 2013  . CORONARY ANGIOPLASTY    . DILATION AND CURETTAGE OF UTERUS     "had fibroids" (03/15/2013)  . EXPLORATORY LAPAROTOMY     "had a growth on my intestines" (03/15/2013)  . HAMMER TOE SURGERY Right   . INSERT / REPLACE / REMOVE PACEMAKER  12/15/2006   Medtronic  . IR ANGIO VERTEBRAL SEL SUBCLAVIAN INNOMINATE UNI R MOD SED  02/17/2019  . IR CT HEAD LTD  02/17/2019  . IR PERCUTANEOUS ART THROMBECTOMY/INFUSION INTRACRANIAL INC DIAG ANGIO  02/17/2019  . MITRAL VALVE REPLACEMENT  1998   St Jude mechanical; Dr. GServando Snare . PACEMAKER PLACEMENT  12/15/06   medtronic adapta for SSS  . PERMANENT PACEMAKER GENERATOR CHANGE N/A 07/24/2014   Procedure: PERMANENT PACEMAKER GENERATOR CHANGE;  Surgeon: MSanda Klein MD;  [facility];  Service: Cardiovascular;  Laterality: N/A;  . PERSANTINE CARDIOLITE  08/07/03   mild inf. ischemia   . RADIOLOGY WITH ANESTHESIA N/A 02/17/2019   Procedure: IR WITH ANESTHESIA;  Surgeon: DLuanne Bras MD;   [facility]  Service: Radiology;  Laterality: N/A;  . RIGHT HEART CATH N/A 02/06/2019   Procedure: RIGHT HEART CATH;  Surgeon: BJolaine Artist MD;  [facility];  Service: Cardiovascular;  Laterality: N/A;  . TEE WITHOUT CARDIOVERSION  09/23/2011   Procedure: TRANSESOPHAGEAL ECHOCARDIOGRAM (TEE);  Surgeon: KPixie Casino  [facility];  Service: Cardiovascular;  Laterality: N/A;  . TONSILLECTOMY    . TUBAL LIGATION    . [place]CHOCARDIOGRAPHY  11/19/2011   EF 50-55%,RA mod to severely dilated,LA severely dilated,trace MR,small vegetation or mass on the MV,AOV mildly scleroticmild PI, RV pressure 40-574mg    Assessment & Plan Clinical Impression: SuNOORA LOCASCIOs a [age]ear old female with history of CAF, CAD, St Jude MVR -chronic coumadin, moderate to severe TVR,  SDH,  chronic diastolic CHF with recent admission 5/14-5/19/20 for acute excacerbation, PPM, HTN, CKD who was admitted 02/17/19 with sudden onset of right sided weakness, left facial droop and slurred speech.  History taken from chart review and patient.  CTA head/neck done revealing acute occlusion of right M1 brach with delayed collateral flow and marked enlargement of superior bilateral ophthalmic vein.  INR subtherapeutic at admission and she underwent cerebral angio with complete revascularization of R-MCA M1 segment with TICI revascularization. Dr. Erlinda Hong felt that stroke was cardioembolic due to subtherapeutic INR in setting of A fib/mechanical valve. She was started on IV heparin and coumadin 5/31.  She has had issue with intermittent coughing and developed diffuse wheezing.  CXR negative--continues on supplemental oxygen. Cardiology consulted for input due to reports of progressive SOB for 2-3 weeks PTA and felt that symptoms multifactorial she was briefly treated with IV diuretics.  Hospital course further complicated by acute on chronic anemia, with steady decline. She developed N/V with hematemesis and  H/A on 6/3.  Stat CT head reviewed, showing posterior right SDH.  Per report, right posterior fossa SDH with paramedian SAH and heparin reversed with protamine and Coumadin held.  Dr. Vaughan Browner felt that hemoptysis likely due to pneumonitis from bleeding and anticoagulation. He  questioned development of interstitial lung disease due to amiodarone leading to SOB. Her symptoms are improving and he recommends chest CT if hemoptysis worsens.  CT head reviewed, showing improvement.  Coumadin/ASA to be resumed with INR goal 2.0-2.5.  GI symptoms and HA have resolved but she continues to have balance deficits and weakness affecting ADLs and mobility. CIR recommended due to functional decline. Please also see preadmission assessment from earlier today.  Patient currently requires min-max with basic self-care skills secondary to muscle weakness, decreased cardiorespiratoy endurance and decreased sitting balance, decreased standing balance, decreased postural control and decreased balance strategies.  Prior to hospitalization, patient could complete BADLs with min.  Patient will benefit from skilled intervention to increase independence with basic self-care skills prior to discharge home with spouse.  Anticipate patient will require 24 hour supervision and minimal physical assistance and follow up home health.  OT - End of Session Endurance Deficit: Yes OT Assessment Rehab Potential (ACUTE ONLY): Excellent OT Barriers to Discharge: Medical stability OT Patient demonstrates impairments in the following area(s): Balance;Endurance;Motor;Safety OT Basic ADL's Functional Problem(s): Grooming;Bathing;Dressing;Toileting OT Advanced ADL's Functional Problem(s): Simple Meal Preparation OT Transfers Functional Problem(s): Toilet OT Additional Impairment(s): None OT Plan OT Intensity: Minimum of 1-2 x/day, 45 to 90 minutes OT Frequency: 5 out of 7 days OT Duration/Estimated Length of Stay: 8-10 days OT  Treatment/Interventions: Balance/vestibular training;Disease mangement/prevention;Neuromuscular re-education;Self Care/advanced ADL retraining;Therapeutic Exercise;Wheelchair propulsion/positioning;DME/adaptive equipment instruction;Pain management;UE/LE Strength taining/ROM;Community reintegration;Patient/family education;UE/LE Coordination activities;Therapeutic Activities;Psychosocial support;Functional mobility training;Discharge planning OT Self Feeding Anticipated Outcome(s): No goal OT Basic Self-Care Anticipated Outcome(s): Supervision/cuing-Min A   OT Toileting Anticipated Outcome(s): Min A  OT Bathroom Transfers Anticipated Outcome(s): Supervision/cuing  OT Recommendation Patient destination: Home Follow Up Recommendations: Home health OT Equipment Recommended: To be determined Equipment Details: hospital bed. Pt reports currently sleeping on small couch and rolling off of it >5 times    Skilled Therapeutic Intervention Skilled OT session completed with focus on initial evaluation, education on OT role/POC, and establishment of patient-centered goals.   Pt greeted in bed with no c/o pain. Supine<sit from flat bed without bedrails completed with Max A. Per pt, she sleeps on a couch at home and has had >5 falls by rolling off of it. Discussed d/c with plan for a new bed and she verbalized understanding. She completed bathing/dressing (EOB sit<stand using device) and oral care/grooming tasks (standing at sink during session). Pt with Rt lean and posterior bias while sitting unsupported EOB. Min A for anterior weight shifting during dynamic tasks. She needed assist for washing lower legs, which was normal PTA. Sit<stand completed with  Min A using RW. Pt able to complete perihygiene herself with Min A for balance due to posterior bias/Rt lean in standing also. She was able to stand while OT donned brief. Then she donned a hospital gown due to not having clothing from home. Pt ambulated a short  distance from bed to sink to complete oral care. Pt required Min A and vcs for staying inside of walker and increasing step length. Used mirror for visual feedback in order to improve upright posture. Pt stood for 7 minutes during oral care and handwashing with steady assist for balance. Throughout session, pt with very slow and deliberate functional movements. At end of session pt returned to w/c and was left with all needs within reach and safety belt fastened.   OT Evaluation Precautions/Restrictions  Precautions Precautions: Fall Restrictions Weight Bearing Restrictions: No General Chart Reviewed: Yes Family/Caregiver Present: No Pain Pain Assessment Pain Scale: 0-10 Pain Score: 0-No pain Home Living/Prior Functioning Home Living Available Help at Discharge: Family, Available 24 hours/day Type of Home: House Entrance Stairs-Number of Steps: 1 Home Layout: Multi-level Alternate Level Stairs-Number of Steps: 7 then 7 Bathroom Shower/Tub: Chiropodist: Handicapped height Bathroom Accessibility: Yes Additional Comments: Pt reports residing on 1st level of home PTA. There is a walker accessible half bath where she'd use the toilet. She would complete sponge bathing at the kitchen sink and use SPC for mobility. 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, employed by [employer] and Hobbies: Bible studies, going to [place] with friends  Prior Function Level of Independence: Needs assistance with ADLs, Needs assistance with tranfers, Requires assistive device for independence, Needs assistance with  gait Driving: No Vocation: Retired ADL ADL Eating: Not assessed Grooming: Contact guard(oral care) Where Assessed-Grooming: Standing at sink Upper Body Bathing: Contact guard Where Assessed-Upper Body Bathing: Edge of bed Lower Body Bathing: Minimal assistance Where Assessed-Lower Body Bathing: Edge of bed Upper Body Dressing: Minimal assistance Where Assessed-Upper Body Dressing: Edge of bed Lower Body Dressing: Maximal assistance Where Assessed-Lower Body Dressing: Edge of bed Toileting: Not assessed Toilet Transfer: Not assessed Tub/Shower Transfer: Not assessed Vision Baseline Vision/History: Wears glasses Wears Glasses: At all times Patient Visual Report: No change from baseline Vision Assessment?: No apparent visual deficits Additional Comments: Pt reported not having glasses at time of eval, however able to read her patient information on the wall board + room clock without issue.  Perception  Perception: Within Functional Limits Praxis Praxis: Intact Cognition Overall Cognitive Status: Within Functional Limits for tasks assessed Arousal/Alertness: Awake/alert Orientation Level: Person;Place;Situation Person: Oriented Place: Oriented Situation: Oriented Year: 2020(when given option of 2) Month: June Day of Week: Correct Memory: Appears intact Attention: Selective Sustained Attention: Appears intact Selective Attention: Appears intact Awareness: Appears intact Problem Solving: Appears intact Safety/Judgment: Appears intact Sensation Sensation Light Touch: Appears Intact(B UEs) Proprioception: Appears Intact Coordination Gross Motor Movements are Fluid and Coordinated: No Fine Motor Movements are Fluid and Coordinated: No Coordination and Movement Description: Slow and deliberate Finger Nose Finger Test: Intact bilaterally  Motor  Motor Motor: Abnormal postural alignment and control;Other (comment) Motor - Skilled Clinical Observations: generalized  weakness Mobility    Bathroom transfers not assessed Trunk/Postural  Assessment  Cervical Assessment Cervical Assessment: Exceptions to WFL(forward head) Thoracic Assessment Thoracic Assessment: (posterior bias, Rt lateral lean in sitting/standing. Rt shoulder elevation) Lumbar Assessment Lumbar Assessment: Exceptions to WFL(posterior pelvic tilt) Postural Control Postural Control: Deficits on evaluation(Posterior balances loses while sitting unsupported during self care. Able to correct with vcs)  Balance Balance Balance Assessed: Yes Dynamic Sitting Balance Dynamic Sitting - Level of Assistance: 4: Min assist Sitting balance - Comments: Washing UB while EOB Dynamic Standing Balance Dynamic Standing - Comments: Min A for balance during erihygiene completion using device Extremity/Trunk Assessment RUE Assessment RUE Assessment: Within Functional Limits Active Range of Motion (AROM) Comments: WNL General Strength Comments: 3+/5 grossly  LUE Assessment LUE Assessment: Within Functional Limits Active Range of Motion (AROM) Comments: WNL General Strength Comments: 3+/5 grossly      Refer to Care Plan for Long Term Goals  Recommendations for other services: None    Discharge Criteria: Patient will be discharged from OT if patient refuses treatment 3 consecutive times without medical reason, if treatment goals not met, if there is a change in medical status, if patient makes no progress towards goals or if patient is discharged from hospital.  The above assessment, treatment plan, treatment alternatives and goals were discussed and mutually agreed upon: by patient  Skeet Simmer 02/25/2019, 12:39 PM

## 2019-02-25 NOTE — Plan of Care (Signed)
  Problem: Consults Goal: RH STROKE PATIENT EDUCATION Description See Patient Education module for education specifics  Outcome: Progressing Goal: Nutrition Consult-if indicated Outcome: Progressing Goal: Diabetes Guidelines if Diabetic/Glucose > 140 Description If diabetic or lab glucose is > 140 mg/dl - Initiate Diabetes/Hyperglycemia Guidelines & Document Interventions  Outcome: Progressing   Problem: RH BOWEL ELIMINATION Goal: RH STG MANAGE BOWEL WITH ASSISTANCE Description STG Manage Bowel with min Assistance.  Outcome: Progressing Goal: RH STG MANAGE BOWEL W/MEDICATION W/ASSISTANCE Description STG Manage Bowel with Medication with min Assistance.  Outcome: Progressing   Problem: RH BLADDER ELIMINATION Goal: RH STG MANAGE BLADDER WITH ASSISTANCE Description STG Manage Bladder With min Assistance  Outcome: Progressing   Problem: RH SKIN INTEGRITY Goal: RH STG SKIN FREE OF INFECTION/BREAKDOWN Description Assess skin every shift  Outcome: Progressing Goal: RH STG MAINTAIN SKIN INTEGRITY WITH ASSISTANCE Description STG Maintain Skin Integrity With min Assistance.  Outcome: Progressing   Problem: RH SAFETY Goal: RH STG ADHERE TO SAFETY PRECAUTIONS W/ASSISTANCE/DEVICE Description STG Adhere to Safety Precautions With min Assistance/Device.  Outcome: Progressing Goal: RH STG DECREASED RISK OF FALL WITH ASSISTANCE Description STG Decreased Risk of Fall With min Assistance.  Outcome: Progressing   Problem: RH COGNITION-NURSING Goal: RH STG USES MEMORY AIDS/STRATEGIES W/ASSIST TO PROBLEM SOLVE Description STG Uses Memory Aids/Strategies With Assistance to Problem Solve. Outcome: Progressing Goal: RH STG ANTICIPATES NEEDS/CALLS FOR ASSIST W/ASSIST/CUES Description STG Anticipates Needs/Calls for Assist With Assistance/Cues. Outcome: Progressing   Problem: RH PAIN MANAGEMENT Goal: RH STG PAIN MANAGED AT OR BELOW PT'S PAIN GOAL Description Pain level less than 3  on scale 0-10  Outcome: Progressing   Problem: RH KNOWLEDGE DEFICIT Goal: RH STG INCREASE KNOWLEDGE OF HYPERTENSION Description Pt will be able to state at least one diet modification to prevent hypertension.  Outcome: Progressing Goal: RH STG INCREASE KNOWLEDGE OF DYSPHAGIA/FLUID INTAKE Description Pt will be able to maintain at least 60% of meal intake.  Outcome: Progressing Goal: RH STG INCREASE KNOWLEGDE OF HYPERLIPIDEMIA Description Pt will be able to name medication used to treat hyperlipidemia  Outcome: Progressing Goal: RH STG INCREASE KNOWLEDGE OF STROKE PROPHYLAXIS Description Pt will be able to state at least 2 medication used to prevent stroke.  Outcome: Progressing

## 2019-02-26 LAB — CBC
HCT: 23.7 % — ABNORMAL LOW (ref 36.0–46.0)
Hemoglobin: 8 g/dL — ABNORMAL LOW (ref 12.0–15.0)
MCH: 29.3 pg (ref 26.0–34.0)
MCHC: 33.8 g/dL (ref 30.0–36.0)
MCV: 86.8 fL (ref 80.0–100.0)
Platelets: 180 10*3/uL (ref 150–400)
RBC: 2.73 MIL/uL — ABNORMAL LOW (ref 3.87–5.11)
RDW: 25 % — ABNORMAL HIGH (ref 11.5–15.5)
WBC: 5.7 10*3/uL (ref 4.0–10.5)
nRBC: 0 % (ref 0.0–0.2)

## 2019-02-26 LAB — PROTIME-INR
INR: 1.4 — ABNORMAL HIGH (ref 0.8–1.2)
Prothrombin Time: 16.6 seconds — ABNORMAL HIGH (ref 11.4–15.2)

## 2019-02-26 MED ORDER — WARFARIN SODIUM 5 MG PO TABS
5.0000 mg | ORAL_TABLET | Freq: Once | ORAL | Status: AC
  Administered 2019-02-26: 18:00:00 5 mg via ORAL
  Filled 2019-02-26: qty 1

## 2019-02-26 NOTE — Progress Notes (Signed)
Carrie [name] is a 78 y.o. 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  Past Medical History:  Diagnosis Date  . Anticoagulated on Coumadin    for mech valve and atrial fib  goal 2.0-2.5  . Anxiety   . Arthritis    "right shoulder" (03/15/2013)  . Atrial fibrillation ([place])   . Atrial flutter ([place])   . CAD (coronary artery disease) 08/11/2016  . Chronic combined systolic and diastolic CHF (congestive heart failure) ([place])    a. 02/2016 Echo: EF 45-50%.  . Diverticula of colon   . GERD (gastroesophageal reflux disease)   . Hemorrhage intraabdominal 03/17/2012  . Hyperlipidemia   . Hypertension   . Liver hemorrhage   . Migraines   . 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 Bi-leaflet St. Jude mechanical prosthesis; b. 02/2016 Echo: EF 45-50%, some degree of MR, sev dil LA/RA, sev TR.  . NSTEMI (non-ST elevated myocardial infarction) ([place])    a. 02/2016 elev trop/Cath: nonobs dzs,   . Pacemaker    medtronic adapta  . Severe tricuspid regurgitation    a. 02/2016 Echo: Ef 45-50%, sev TR, PASP 59mmHg.  . Sick sinus syndrome ([place])    Dr Cristopher Peru. EP study negative. Pacemaker 12/15/06 Medtronic  . Stroke [facility])    "they say I had a stroke last year" denies residual on 03/15/2013  . Subdural hematoma ([facility])      Subjective: No new complaints. No new problems. Slept well.  Tolerating regular diet  Objective: Vital signs in last 24 hours: Temp:  [98 F (36.7 C)-98.7 F (37.1 C)] 98.6 F (37 C) (06/07 0449) Pulse Rate:  [56-61] 56 (06/07 0449) Resp:  [16] 16 (06/07 0449) BP: (99-113)/(50-57) 99/50 (06/07 0449) SpO2:  [91 %-93 %] 91 % (06/07 0449) Weight:  [54.1 kg] 54.1 kg (06/07 0449) Weight change: 0.8 kg Last BM Date: 02/25/19  Intake/Output from previous day: 06/06 0701 - 06/07 0700 In: 948 [P.O.:948] Out: 750 [Urine:750]  Patient Vitals for the past 24 hrs:  BP Temp Temp src Pulse Resp  SpO2 Weight  02/26/19 0449 (!) 99/50 98.6 F (37 C) Oral (!) 56 16 91 % 54.1 kg  02/25/19 1942 (!) 105/57 98 F (36.7 C) Oral (!) 58 16 93 % -  02/25/19 1554 (!) 113/54 98.7 F (37.1 C) Oral 61 - 92 % -   Lab Results  Component Value Date   INR 1.4 (H) 02/26/2019   INR 1.5 (H) 02/25/2019   INR 1.6 (H) 02/24/2019    Physical Exam General: No apparent distress   HEENT: Unremarkable Lungs: Normal effort. Lungs clear to auscultation, no crackles or wheezes. Cardiovascular: Irregular rhythm with a grade 3/6 systolic murmur heard diffusely Abdomen: S/NT/ND; BS(+) Musculoskeletal:  unchanged Neurological: No new neurological deficits Wounds: N/A    Skin: clear IV catheter left arm Mental state: Alert, oriented, cooperative    Lab Results: BMET    Component Value Date/Time   NA 135 02/25/2019 0547   NA 142 01/25/2019 1145   NA 139 12/03/2014 0959   K 3.3 (L) 02/25/2019 0547   K 3.9 12/03/2014 0959   CL 99 02/25/2019 0547   CL 100 03/14/2010 1318   CO2 27 02/25/2019 0547   CO2 26 12/03/2014 0959   GLUCOSE 96 02/25/2019 0547   GLUCOSE 77 12/03/2014 0959   GLUCOSE 101 03/14/2010 1318   BUN 29 (H) 02/25/2019 0547   BUN 38 (H)  01/25/2019 1145   BUN 18.7 12/03/2014 0959   CREATININE 1.14 (H) 02/25/2019 0547   CREATININE 0.95 (H) 10/27/2016 1546   CREATININE 0.9 12/03/2014 0959   CALCIUM 9.4 02/25/2019 0547   CALCIUM 10.5 (H) 12/03/2014 0959   GFRNONAA 46 (L) 02/25/2019 0547   GFRAA 54 (L) 02/25/2019 0547   CBC    Component Value Date/Time   WBC 5.7 02/26/2019 0554   RBC 2.73 (L) 02/26/2019 0554   HGB 8.0 (L) 02/26/2019 0554   HGB 9.6 (L) 12/17/2016 1057   HCT 23.7 (L) 02/26/2019 0554   HCT 29.2 (L) 12/17/2016 1057   PLT 180 02/26/2019 0554   PLT 158 12/17/2016 1057   MCV 86.8 02/26/2019 0554   MCV 84.9 12/17/2016 1057   MCH 29.3 02/26/2019 0554   MCHC 33.8 02/26/2019 0554   RDW 25.0 (H) 02/26/2019 0554   RDW 28.8 (H) 12/17/2016 1057   LYMPHSABS 0.7  02/25/2019 0547   LYMPHSABS 0.8 (L) 12/17/2016 1057   MONOABS 1.1 (H) 02/25/2019 0547   MONOABS 0.7 12/17/2016 1057   EOSABS 0.2 02/25/2019 0547   EOSABS 0.1 12/17/2016 1057   EOSABS 0.2 03/14/2010 1318   BASOSABS 0.0 02/25/2019 0547   BASOSABS 0.1 12/17/2016 1057     Medications: I have reviewed the patient's current medications.  Assessment/Plan:  Functional deficits following cardioembolic stroke with weakness and unsteady gait.  Continue CIR Chronic atrial fibrillation/status post [facility] Jude MVR/chronic Coumadin anticoagulation.  INR remains subtherapeutic.  Follow-up pharmacology CAD/chronic diastolic heart failure stable    Length of stay, days: 2  Marletta Lor , MD 02/26/2019, 9:13 AM

## 2019-02-26 NOTE — Progress Notes (Signed)
[place] for warfarin Indication: atrial fibrillation and mechanical valve  Allergies  Allergen Reactions  . Codeine Nausea And Vomiting    Patient Measurements: Height: 5\' 1"  (154.9 cm) Weight: 119 lb 4.3 oz (54.1 kg) IBW/kg (Calculated) : 47.8  Vital Signs: Temp: 98.6 F (37 C) (06/07 0449) Temp Source: Oral (06/07 0449) BP: 99/50 (06/07 0449) Pulse Rate: 56 (06/07 0449)  Labs: Recent Labs    02/24/19 0338 02/25/19 0547 02/26/19 0554  HGB 8.2* 8.7* 8.0*  HCT 24.6* 26.3* 23.7*  PLT 173 207 180  LABPROT 18.7* 18.1* 16.6*  INR 1.6* 1.5* 1.4*  CREATININE 1.19* 1.14*  --     Estimated Creatinine Clearance: 31.2 mL/min (A) (by C-G formula based on SCr of 1.14 mg/dL (H)).  Assessment: 28 yof s/p MCA M1 occlusion s/p IR revascularization. She is on chronic warfarin for afib and mechanical valve. INR on admission was subtherapeutic at 1.9 so she was initiated on IV heparin as a bridge back to therapeutic warfarin. Unfortunately she developed an SDH and SAH. Heparin was reversed and warfarin held.   Warfarin resumed on 6/5 with new INR goal of 2-2.5.   INR = 1.4 today after 2 x 5mg  Warfarin CBC: Hgb 8.0 low/stable, Pltc WNL  Goal of Therapy:  INR 2-2.5 Monitor platelets by anticoagulation protocol: Yes   Plan:  Warfarin 5mg  PO x 1 tonight Daily INR *Anticipate discharge warfarin regimen will need to be adjusted to accommodate new INR goal   Minda Ditto PharmD [phone] 02/26/2019 8:35 AM

## 2019-02-27 ENCOUNTER — Inpatient Hospital Stay (HOSPITAL_COMMUNITY): Payer: Medicare Other

## 2019-02-27 ENCOUNTER — Inpatient Hospital Stay (HOSPITAL_COMMUNITY): Payer: Medicare Other | Admitting: Occupational Therapy

## 2019-02-27 DIAGNOSIS — Z9889 Other specified postprocedural states: Secondary | ICD-10-CM

## 2019-02-27 DIAGNOSIS — S065X9A Traumatic subdural hemorrhage with loss of consciousness of unspecified duration, initial encounter: Secondary | ICD-10-CM

## 2019-02-27 DIAGNOSIS — I071 Rheumatic tricuspid insufficiency: Secondary | ICD-10-CM

## 2019-02-27 LAB — BASIC METABOLIC PANEL
Anion gap: 10 (ref 5–15)
BUN: 40 mg/dL — ABNORMAL HIGH (ref 8–23)
CO2: 22 mmol/L (ref 22–32)
Calcium: 8.8 mg/dL — ABNORMAL LOW (ref 8.9–10.3)
Chloride: 99 mmol/L (ref 98–111)
Creatinine, Ser: 1.44 mg/dL — ABNORMAL HIGH (ref 0.44–1.00)
GFR calc Af Amer: 40 mL/min — ABNORMAL LOW (ref >60)
GFR calc non Af Amer: 35 mL/min — ABNORMAL LOW (ref >60)
Glucose, Bld: 128 mg/dL — ABNORMAL HIGH (ref 70–99)
Potassium: 3.6 mmol/L (ref 3.5–5.1)
Sodium: 131 mmol/L — ABNORMAL LOW (ref 135–145)

## 2019-02-27 LAB — CBC
HCT: 24.8 % — ABNORMAL LOW (ref 36.0–46.0)
Hemoglobin: 8.5 g/dL — ABNORMAL LOW (ref 12.0–15.0)
MCH: 29.7 pg (ref 26.0–34.0)
MCHC: 34.3 g/dL (ref 30.0–36.0)
MCV: 86.7 fL (ref 80.0–100.0)
Platelets: 174 10*3/uL (ref 150–400)
RBC: 2.86 MIL/uL — ABNORMAL LOW (ref 3.87–5.11)
RDW: 25.5 % — ABNORMAL HIGH (ref 11.5–15.5)
WBC: 5.5 10*3/uL (ref 4.0–10.5)
nRBC: 0.4 % — ABNORMAL HIGH (ref 0.0–0.2)

## 2019-02-27 LAB — PROTIME-INR
INR: 1.5 — ABNORMAL HIGH (ref 0.8–1.2)
Prothrombin Time: 17.5 seconds — ABNORMAL HIGH (ref 11.4–15.2)

## 2019-02-27 MED ORDER — WARFARIN SODIUM 5 MG PO TABS
5.0000 mg | ORAL_TABLET | Freq: Once | ORAL | Status: AC
  Administered 2019-02-27: 17:00:00 5 mg via ORAL
  Filled 2019-02-27: qty 1

## 2019-02-27 NOTE — Progress Notes (Signed)
Physical Therapy Session Note  Patient Details  Name: Carrie [name] MRN: [id number] Date of Birth: November 16, 1940  Today's Date: 02/27/2019 PT Individual Time: 0915-1028 PT Individual Time Calculation (min): 73 min   Short Term Goals: Week 1:  PT Short Term Goal 1 (Week 1): Pt will perform supine<>sit with min assist PT Short Term Goal 2 (Week 1): Pt will perform bed<>chair transfers using LRAD with CGA PT Short Term Goal 3 (Week 1): Pt will ambulate at least 109ft using LRAD with CGA PT Short Term Goal 4 (Week 1): Pt will ascend/descend 2 steps using unilateral handrail or LRAD per home set-up with CGA  Skilled Therapeutic Interventions/Progress Updates:    Pt supine in bed upon PT arrival, agreeable to therapy tx and denies pain. Pt reports having to use the bathroom. Pt transferred to sitting EOB with min assist and increased time, cues for techniques. Pt performed sit<>stand with min assist and RW, ambulated x 10 ft into bathroom with RW and min assist, decreased gait speed, cues for increased step length. Pt transferred to toilet, noted to be incontinent of bladder with a soiled brief. Pt maintained standing balance with use of grab bar and min assist while pt performed pericare, therapist donned clean brief total assist. Pt performed stand pivot to w/c with min assist and cues for foot placement. Pt seated in w/c at the sink to wash hands. Pt transported to the gym. Therapist provided pt with different size w/c and cushion this session to allow for better positioning and fit. Pt performed stand pivot from w/c>w/c with RW and min assist, increased time to complete secondary to short/small steps and slow movements. Pt worked on standing balance and standing activity tolerance this session with UE support on RW while reaching overhead for horseshoes to increase trunk extension and then tossing, min assist x 2 trials. Pt worked on sit<>stands from w/c this session with RW x 5 with cues for hand placement  and tactile/verbal cues for anterior weightshift. Pt transported to the dayroom and used kinetron this session for LE strengthening and endurance on resistance level 40 cm/sec, 4 x 1 min bouts. Pt transported back to room and left in w/c with needs in reach and chair alarm set.   Therapy Documentation Precautions:  Precautions Precautions: Fall Restrictions Weight Bearing Restrictions: No    Therapy/Group: Individual Therapy  Netta Corrigan, PT, DPT 02/27/2019, 9:29 AM

## 2019-02-27 NOTE — Progress Notes (Signed)
[place] PHYSICAL MEDICINE & REHABILITATION PROGRESS NOTE   Subjective/Complaints:  Pt feels like she talks slower than normal, no pain c/os except LLE occ  ROS- denies CP, SOB, N/V/D, denies hemoptysis  Objective:   No results found. Recent Labs    02/25/19 0547 02/26/19 0554  WBC 6.3 5.7  HGB 8.7* 8.0*  HCT 26.3* 23.7*  PLT 207 180   Recent Labs    02/25/19 0547  NA 135  K 3.3*  CL 99  CO2 27  GLUCOSE 96  BUN 29*  CREATININE 1.14*  CALCIUM 9.4    Intake/Output Summary (Last 24 hours) at 02/27/2019 0831 Last data filed at 02/27/2019 0742 Gross per 24 hour  Intake 716 ml  Output -  Net 716 ml     Physical Exam: Vital Signs Blood pressure (!) 100/57, pulse 60, temperature 98.5 F (36.9 C), temperature source Oral, resp. rate 16, height 5\' 1"  (1.549 m), weight 54.2 kg, SpO2 92 %.   General: No acute distress Mood and affect are appropriate Heart: Regular rate and rhythm no rubs murmurs or extra sounds Lungs: Clear to auscultation, breathing unlabored, no rales or wheezes Abdomen: Positive bowel sounds, soft nontender to palpation, nondistended Extremities: No clubbing, cyanosis, or edema Skin: No evidence of breakdown, no evidence of rash Neurologic: Cranial nerves II through XII intact, motor strength is 5/5 in bilateral deltoid, bicep, tricep, grip, hip flexor, knee extensors, ankle dorsiflexor and plantar flexor Sensory exam normal sensation to light touch and proprioception in bilateral upper and lower extremities Cerebellar exam normal finger to nose to finger as well as heel to shin in bilateral upper and lower extremities Musculoskeletal: Full range of motion in all 4 extremities. No joint swelling   Assessment/Plan: 1. Functional deficits secondary to Right post fossa SDH  which require 3+ hours per day of interdisciplinary therapy in a comprehensive inpatient rehab setting.  Physiatrist is providing close team supervision and 24 hour management of  active medical problems listed below.  Physiatrist and rehab team continue to assess barriers to discharge/monitor patient progress toward functional and medical goals  Care Tool:  Bathing    Body parts bathed by patient: Right arm, Left arm, Chest, Abdomen, Front perineal area, Buttocks, Right upper leg, Left upper leg, Face   Body parts bathed by helper: Right lower leg, Left lower leg     Bathing assist Assist Level: Minimal Assistance - Patient > 75%     Upper Body Dressing/Undressing Upper body dressing   What is the patient wearing?: Hospital gown only    Upper body assist Assist Level: Minimal Assistance - Patient > 75%    Lower Body Dressing/Undressing Lower body dressing      What is the patient wearing?: Incontinence brief     Lower body assist Assist for lower body dressing: Moderate Assistance - Patient 50 - 74%     Toileting Toileting Toileting Activity did not occur Landscape architect and hygiene only): Refused  Toileting assist Assist for toileting: Moderate Assistance - Patient 50 - 74%     Transfers Chair/bed transfer  Transfers assist     Chair/bed transfer assist level: Moderate Assistance - Patient 50 - 74%     Locomotion Ambulation   Ambulation assist      Assist level: Minimal Assistance - Patient > 75% Assistive device: Walker-rolling Max distance: 20ft   Walk 10 feet activity   Assist     Assist level: Minimal Assistance - Patient > 75% Assistive device: Walker-rolling  Walk 50 feet activity   Assist    Assist level: Minimal Assistance - Patient > 75% Assistive device: Walker-rolling    Walk 150 feet activity   Assist Walk 150 feet activity did not occur: Safety/medical concerns         Walk 10 feet on uneven surface  activity   Assist Walk 10 feet on uneven surfaces activity did not occur: Safety/medical concerns         Wheelchair     Assist Will patient use wheelchair at discharge?: (TBD  )             Wheelchair 50 feet with 2 turns activity    Assist            Wheelchair 150 feet activity     Assist          Medical Problem List and Plan: 1.  Balance deficits and weakness affecting ADLs and mobility  secondary to cardioembolic stroke complicated by SDH/SAH right posterior fossa              CIR PT, OT, SLP  2.  St Jude's Mitral valve/A fib/ Antithrombotics: -DVT/anticoagulation:  Pharmaceutical: Coumadin              INR subtherapeutic at present             -antiplatelet therapy: low dose ASA 3. Pain Management: tylenol prn.  4. Mood: LCSW to follow for evaluation and support.  Team to provide ego support.              -antipsychotic agents: NA 5. Neuropsych: This patient is not fully capable of making decisions on her own behalf. 6. Skin/Wound Care: routine pressure relief measures.  7. Fluids/Electrolytes/Nutrition: Monitor I/O. Intake improving.  8. CAD/Chronic diastolic FBP:PHKFEXMD heart disease with tricuspid regurg, MV replacement  Heart Healthy diet. SOB improving.  Strict I/O's. Continue lasix bid, spironolactone, lopressor with low dose ASA. Filed Weights   02/17/19 1100 02/24/19 0500  Weight: 59 kg 58.8 kg  9. Pneumonitis with hematemesis/Hypoxia: Continue IV protonix. Will monitor for now.              Wean supplemental oxygen as tolerated 10. Chronic A fib/ SSS s/p PPM: Monitor HR tid--continue amiodarone. On coumdin.             Monitor with increased mobility 11. Acute on chronic anemia?:  Check iron panel.  Follow with serial checks. Transfuse if Hgb < 7.0.  Add iron supplement. Had hemoptysis but not ongoing,  stool guaic neg 6/6 12. CKD: Baseline SCr-1.3? Encourage fluid intake.  BMP ordered for Monday. 13. Sleep disturbance:  Add trazodone prn.       LOS: 3 days A FACE TO FACE EVALUATION WAS PERFORMED  Charlett Blake 02/27/2019, 8:31 AM

## 2019-02-27 NOTE — Progress Notes (Signed)
[place] for warfarin Indication: atrial fibrillation and mechanical valve  Allergies  Allergen Reactions  . Codeine Nausea And Vomiting    Patient Measurements: Height: 5\' 1"  (154.9 cm) Weight: 119 lb 8.2 oz (54.2 kg) IBW/kg (Calculated) : 47.8  Vital Signs: Temp: 98.5 F (36.9 C) (06/08 0310) Temp Source: Oral (06/08 0310) BP: 100/57 (06/08 0310) Pulse Rate: 60 (06/08 0310)  Labs: Recent Labs    02/25/19 0547 02/26/19 0554  HGB 8.7* 8.0*  HCT 26.3* 23.7*  PLT 207 180  LABPROT 18.1* 16.6*  INR 1.5* 1.4*  CREATININE 1.14*  --     Estimated Creatinine Clearance: 31.2 mL/min (A) (by C-G formula based on SCr of 1.14 mg/dL (H)).  Assessment: 24 yof s/p MCA M1 occlusion s/p IR revascularization. She is on chronic warfarin for afib and mechanical valve. INR on admission was subtherapeutic at 1.9 so she was initiated on IV heparin as a bridge back to therapeutic warfarin. Unfortunately she developed an SDH and SAH. Heparin was reversed and warfarin held.   Warfarin resumed on 6/5 with new INR goal of 2-2.5.   Goal of Therapy:  INR 2-2.5 Monitor platelets by anticoagulation protocol: Yes   Plan:  Repeat Warfarin 5mg  PO x 1 tonight Daily INR *Anticipate discharge warfarin regimen will need to be adjusted to accommodate new INR goal   Tad Moore PharmD [phone] 02/27/2019 8:30 AM

## 2019-02-27 NOTE — Progress Notes (Signed)
Occupational Therapy Session Note  Patient Details  Name: Carrie [name] MRN: [id number] Date of Birth: 1940/11/20  Today's Date: 02/27/2019 OT Individual Time: 4098-1191 OT Individual Time Calculation (min): 72 min    Short Term Goals: Week 1:  OT Short Term Goal 1 (Week 1): STGs=LTGs due to ELOS  Skilled Therapeutic Interventions/Progress Updates:    Pt completed bathing and dressing sit to stand at the sink this session.  Pt with increased processing time to questions asked by therapist some of the time with delayed answers.  This may have been attributable to pt eating when therapist entered and that she was not able to alternate her attention between the tasks.  She was able to complete bathing with overall min assist, supervision for UB.  She needed mod assist for donning pants with total assist for gripper socks.  Decreased ability to efficiently reach either foot when working on bathing and when trying to donn all clothing.  Supervision for UB dressing.  Finished session with pt staying in the wheelchair with call button and phone in place with safety belt in place.    Therapy Documentation Precautions:  Precautions Precautions: Fall Restrictions Weight Bearing Restrictions: No  Pain: Pain Assessment Pain Scale: Faces Pain Score: 0-No pain ADL: See Care Tool Section for some details of ADL  Therapy/Group: Individual Therapy  Tasheema Perrone OTR/L 02/27/2019, 3:37 PM

## 2019-02-27 NOTE — IPOC Note (Signed)
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 ([place])   Embolic stroke ([place])     Functional Problem List: Nursing Bowel, Endurance, Safety, Bladder, Pain  PT Balance, Pain, Perception, Safety, Motor, Endurance, Edema  OT Balance, Endurance, Motor, Safety  SLP    TR         Basic ADL's: OT Grooming, Bathing, Dressing, Toileting     Advanced  ADL's: OT Simple Meal Preparation     Transfers: PT Bed Mobility, Bed to Chair, Car, Chief Operating Officer: PT Ambulation, Stairs, Wheelchair Mobility     Additional Impairments: OT None  SLP None      TR      Anticipated Outcomes Item Anticipated Outcome  Self Feeding No goal  Swallowing      Basic self-care  Supervision/cuing-Min A   Toileting  Min A    Bathroom Transfers Supervision/cuing   Bowel/Bladder  continent of bowel and bladder with min assist  Transfers  supervision  Locomotion  CGA ambulating with LRAD  Communication     Cognition     Pain  pain less than 3 on scale 0-10  Safety/Judgment  remain free of injury, fall prevention   Therapy Plan: PT Intensity: Minimum of 1-2 x/day ,45 to 90 minutes PT Frequency: 5 out of 7 days PT Duration Estimated Length of Stay: 8-10 days OT Intensity: Minimum of 1-2 x/day, 45 to 90 minutes OT Frequency: 5 out of 7 days OT Duration/Estimated Length of Stay: 8-10 days     Due to the current state of emergency, patients may not be receiving their 3-hours of Medicare-mandated therapy.   Team Interventions: Nursing Interventions Bowel Management, Pain Management, Bladder Management, Medication Management  PT interventions Ambulation/gait training, Cognitive remediation/compensation, Discharge planning, DME/adaptive equipment instruction, Functional  mobility training, Pain management, Psychosocial support, Splinting/orthotics, Therapeutic Activities, UE/LE Strength taining/ROM, Visual/perceptual remediation/compensation, Training and development officer, Community reintegration, Disease management/prevention, Technical sales engineer stimulation, Neuromuscular re-education, Patient/family education, Skin care/wound management, Stair training, Therapeutic Exercise, UE/LE Coordination activities, Wheelchair propulsion/positioning  OT Interventions Training and development officer, Disease mangement/prevention, Neuromuscular re-education, Self Care/advanced ADL retraining, Therapeutic Exercise, Wheelchair propulsion/positioning, DME/adaptive equipment instruction, Pain management, UE/LE Strength taining/ROM, Academic librarian, Barrister's clerk education, UE/LE Coordination activities, Therapeutic Activities, Psychosocial support, Functional mobility training, Discharge planning  SLP Interventions    TR Interventions    SW/CM Interventions Discharge Planning, Psychosocial Support, Patient/Family Education   Barriers to Discharge MD  Medical stability  Nursing      PT Inaccessible home environment, Home environment access/layout, Other (comments) impaired awareness of LOB  OT Medical stability    SLP      SW       Team Discharge Planning: Destination: PT-Home ,OT- Home , SLP-Home Projected Follow-up: PT-Home health PT, 24 hour supervision/assistance, OT-  Home health OT, SLP-None Projected Equipment Needs: PT-To be determined, OT- To be determined, SLP-None recommended by SLP Equipment Details: PT- , OT-hospital bed. Pt reports currently sleeping on small couch and rolling off of it >5 times  Patient/family involved in discharge planning: PT- Patient,  OT-Patient, SLP-Patient, Family member/caregiver  MD ELOS: 8-10 day Medical Rehab Prognosis:  Excellent Assessment: The patient has been admitted for CIR therapies with the diagnosis of right mca  infarct. The team will be addressing functional mobility, strength, stamina, balance, safety, adaptive techniques and equipment,  self-care, bowel and bladder mgt, patient and caregiver education, NMR, visual-spatial awareness . Goals have been set at supervision to min/CG assist for mobility and self-care.   Due to the current state of emergency, patients may not be receiving their 3 hours per day of Medicare-mandated therapy.    Meredith Staggers, MD, FAAPMR      See Team Conference Notes for weekly updates to the plan of care

## 2019-02-28 ENCOUNTER — Encounter (HOSPITAL_COMMUNITY): Payer: Self-pay | Admitting: Interventional Radiology

## 2019-02-28 ENCOUNTER — Inpatient Hospital Stay (HOSPITAL_COMMUNITY): Payer: Medicare Other | Admitting: Occupational Therapy

## 2019-02-28 ENCOUNTER — Inpatient Hospital Stay (HOSPITAL_COMMUNITY): Payer: Medicare Other

## 2019-02-28 LAB — PROTIME-INR
INR: 1.5 — ABNORMAL HIGH (ref 0.8–1.2)
Prothrombin Time: 18.2 seconds — ABNORMAL HIGH (ref 11.4–15.2)

## 2019-02-28 LAB — OCCULT BLOOD X 1 CARD TO LAB, STOOL: Fecal Occult Bld: NEGATIVE

## 2019-02-28 MED ORDER — WARFARIN SODIUM 7.5 MG PO TABS
7.5000 mg | ORAL_TABLET | Freq: Once | ORAL | Status: AC
  Administered 2019-02-28: 18:00:00 7.5 mg via ORAL
  Filled 2019-02-28: qty 1

## 2019-02-28 NOTE — Progress Notes (Signed)
[facility] PHYSICAL MEDICINE & REHABILITATION PROGRESS NOTE   Subjective/Complaints:  "my coffee is cold " No other c/os   ROS- denies CP, SOB, N/V/D, denies hemoptysis  Objective:   No results found. Recent Labs    02/26/19 0554 02/27/19 0842  WBC 5.7 5.5  HGB 8.0* 8.5*  HCT 23.7* 24.8*  PLT 180 174   Recent Labs    02/27/19 0842  NA 131*  K 3.6  CL 99  CO2 22  GLUCOSE 128*  BUN 40*  CREATININE 1.44*  CALCIUM 8.8*    Intake/Output Summary (Last 24 hours) at 02/28/2019 0839 Last data filed at 02/27/2019 1836 Gross per 24 hour  Intake 476 ml  Output -  Net 476 ml     Physical Exam: Vital Signs Blood pressure (!) 93/50, pulse (!) 58, temperature 98.5 F (36.9 C), resp. rate 20, height 5\' 1"  (1.549 m), weight 55.3 kg, SpO2 100 %.   General: No acute distress Mood and affect are appropriate Heart: Regular rate and rhythm no rubs murmurs or extra sounds Lungs: Clear to auscultation, breathing unlabored, no rales or wheezes Abdomen: Positive bowel sounds, soft nontender to palpation, nondistended Extremities: No clubbing, cyanosis, or edema Skin: No evidence of breakdown, no evidence of rash Neurologic: Cranial nerves II through XII intact, motor strength is 5/5 in bilateral deltoid, bicep, tricep, grip, hip flexor, knee extensors, ankle dorsiflexor and plantar flexor Sensory exam normal sensation to light touch and proprioception in bilateral upper and lower extremities Cerebellar exam normal finger to nose to finger as well as heel to shin in bilateral upper and lower extremities Musculoskeletal: Full range of motion in all 4 extremities. No joint swelling   Assessment/Plan: 1. Functional deficits secondary to Right post fossa SDH  which require 3+ hours per day of interdisciplinary therapy in a comprehensive inpatient rehab setting.  Physiatrist is providing close team supervision and 24 hour management of active medical problems listed below.  Physiatrist  and rehab team continue to assess barriers to discharge/monitor patient progress toward functional and medical goals  Care Tool:  Bathing    Body parts bathed by patient: Right arm, Left arm, Chest, Abdomen, Front perineal area, Buttocks, Face, Left lower leg, Right lower leg, Left upper leg, Right upper leg   Body parts bathed by helper: Right lower leg, Left lower leg     Bathing assist Assist Level: Minimal Assistance - Patient > 75%     Upper Body Dressing/Undressing Upper body dressing   What is the patient wearing?: Pull over shirt    Upper body assist Assist Level: Supervision/Verbal cueing    Lower Body Dressing/Undressing Lower body dressing      What is the patient wearing?: Incontinence brief, Pants     Lower body assist Assist for lower body dressing: Moderate Assistance - Patient 50 - 74%     Toileting Toileting Toileting Activity did not occur Landscape architect and hygiene only): Refused  Toileting assist Assist for toileting: Moderate Assistance - Patient 50 - 74%     Transfers Chair/bed transfer  Transfers assist     Chair/bed transfer assist level: Moderate Assistance - Patient 50 - 74%     Locomotion Ambulation   Ambulation assist      Assist level: Minimal Assistance - Patient > 75% Assistive device: Walker-rolling Max distance: 10 ft   Walk 10 feet activity   Assist     Assist level: Minimal Assistance - Patient > 75% Assistive device: Walker-rolling   Walk 50  feet activity   Assist    Assist level: Minimal Assistance - Patient > 75% Assistive device: Walker-rolling    Walk 150 feet activity   Assist Walk 150 feet activity did not occur: Safety/medical concerns         Walk 10 feet on uneven surface  activity   Assist Walk 10 feet on uneven surfaces activity did not occur: Safety/medical concerns         Wheelchair     Assist Will patient use wheelchair at discharge?: (TBD )              Wheelchair 50 feet with 2 turns activity    Assist            Wheelchair 150 feet activity     Assist          Medical Problem List and Plan: 1.  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  [employer] Jude's Mitral valve/A fib/ Antithrombotics: -DVT/anticoagulation:  Pharmaceutical: Coumadin              INR subtherapeutic at present             -antiplatelet therapy: low dose ASA 3. Pain Management: tylenol prn.  4. Mood: LCSW to follow for evaluation and support.  Team to provide ego support.              -antipsychotic agents: NA 5. Neuropsych: This patient is not fully capable of making decisions on her own behalf. 6. Skin/Wound Care: routine pressure relief measures.  7. Fluids/Electrolytes/Nutrition: Monitor I/O. Intake improving.  8. CAD/Chronic diastolic NMM:[id number] heart disease with tricuspid regurg, MV replacement  Heart Healthy diet. SOB improving.  Strict I/O's. Continue lasix bid, spironolactone, lopressor with low dose ASA. Filed Weights   02/17/19 1100 02/24/19 0500  Weight: 59 kg 58.8 kg  9. Pneumonitis with hematemesis/Hypoxia: Continue IV protonix. Will monitor for now.              Wean supplemental oxygen as tolerated 10. Chronic A fib/ SSS s/p PPM: Monitor HR tid--continue amiodarone. On coumdin.             Monitor with increased mobility 11. Acute on chronic anemia?:  Check iron panel.  Follow with serial checks. Transfuse if Hgb < 7.0.  Add iron supplement. Had hemoptysis but not ongoing,  stool guaic neg 6/6 12. CKD: Baseline SCr-1.3? Encourage fluid intake.  creat mildly elevated over baseline at 1.44 13. Sleep disturbance:  Add trazodone prn.       LOS: 4 days A FACE TO FACE EVALUATION WAS PERFORMED  Charlett Blake 02/28/2019, 8:39 AM

## 2019-02-28 NOTE — Progress Notes (Signed)
Physical Therapy Session Note  Patient Details  Name: Carrie [name] MRN: [id number] Date of Birth: July 30, 1941  Today's Date: 02/28/2019 PT Individual Time: 1130-1228 and 8315-1761 PT Individual Time Calculation (min): 58 min and 71 min   Short Term Goals: Week 1:  PT Short Term Goal 1 (Week 1): Pt will perform supine<>sit with min assist PT Short Term Goal 2 (Week 1): Pt will perform bed<>chair transfers using LRAD with CGA PT Short Term Goal 3 (Week 1): Pt will ambulate at least 91ft using LRAD with CGA PT Short Term Goal 4 (Week 1): Pt will ascend/descend 2 steps using unilateral handrail or LRAD per home set-up with CGA  Skilled Therapeutic Interventions/Progress Updates:   Session 1:  Pt seated in w/c upon PT arrival, agreeable to therapy tx and denies pain. Pt transported to the gym in w/c. Pt ambulated x 15 ft to the mat with RW and min assist, increased cues with turning for foot placement and RW management. Pt ambulated 2 x 85 ft this session working on gait and endurance, cues for increased step length, upright posture and RW management during turns. Pt worked on standing balance and foot clearance to perform toe taps on aerobic step this session, B UE support on RW, min assist with cues for techniques. Pt worked on standing balance and upright posture to perform overhead reaching activity for horseshoes and then tossed them, x 2 trials with CGA. Pt performed 2 x 5 sit<>stands from mat with UE support pushing from mat, min assist with cues for techniques, working on LE strength. Pt worked on standing balance without UE support to perform ball toss activity, close supervision. Pt worked on standing balance and upright posture to perform overhead ball taps with each UE x 10. Pt transferred to w/c with min assist and transported back to room, left in w/c with needs in reach and chair alarm set.   Session 2: Pt seated in w/c upon PT arrival, agreeable to therapy tx and denies pain. Pt  transported to the gym. Pt ascended/descended 4 steps this session with B rails and min assist, step to pattern with cues for techniques, increased time to complete. Pt ambulated x 10 ft to the mat with RW and min assist. Pt worked on standing balance and standing activity tolerance while completing PVC pipe tree puzzle x 5 min, supervision for balance and min cues for techniques. Pt ambulated 2 x 80 ft this session with RW and min assist working on gait and activity tolerance, cues for increased step length and upright posture. Pt worked on dynamic standing balance while standing on airex tossing horseshoes, standing on airex with feet together and no UE support, standing on airex with eyes closed, all of these with CGA-min assist. Pt and therapist dicsussed using a transport w/c in the community for longer distances, pt agreeable and interested in getting w/c. Pt reports he husband would be able to push her in a transported w/c, this therapist will follow up with social work on this. Pt and therapist also discussed having her husband come in soon for family education. Pt transferred to w/c with min assist and RW, transported to dayroom. Pt transferred w/c<>nustep this session with min assist and RW, used nustep x 8 minutes for global strengthening and endurance on workload 5. Pt transported back to room and left in w/c with needs in reach and chair alarm set.   Therapy Documentation Precautions:  Precautions Precautions: Fall Restrictions Weight Bearing Restrictions: No  Therapy/Group: Individual Therapy  Netta Corrigan, PT, DPT 02/28/2019, 7:55 AM

## 2019-02-28 NOTE — Progress Notes (Signed)
ANTICOAGULATION CONSULT NOTE   Pharmacy Consult for Warfarin Indication: atrial fibrillation and mechanical valve  Allergies  Allergen Reactions  . Codeine Nausea And Vomiting    Patient Measurements: Height: 5\' 1"  (154.9 cm) Weight: 121 lb 14.6 oz (55.3 kg) IBW/kg (Calculated) : 47.8  Vital Signs: Temp: 98.5 F (36.9 C) (06/09 0822) BP: 93/50 (06/09 0822) Pulse Rate: 58 (06/09 0822)  Labs: Recent Labs    02/26/19 0554 02/27/19 0842 02/28/19 0511  HGB 8.0* 8.5*  --   HCT 23.7* 24.8*  --   PLT 180 174  --   LABPROT 16.6* 17.5* 18.2*  INR 1.4* 1.5* 1.5*  CREATININE  --  1.44*  --     Estimated Creatinine Clearance: 24.7 mL/min (A) (by C-G formula based on SCr of 1.44 mg/dL (H)).  Assessment: 81 yof s/p MCA M1 occlusion s/p IR revascularization. She is on chronic warfarin for afib and mechanical valve. INR on admission was subtherapeutic at 1.9 so she was initiated on IV heparin as a bridge back to therapeutic warfarin. Unfortunately she developed an SDH and SAH. Heparin was reversed and warfarin held.   Warfarin resumed on 6/5 with new INR goal of 2-2.5.   Goal of Therapy:  INR 2-2.5 Monitor platelets by anticoagulation protocol: Yes   Plan:  Warfarin 7.5 mg po x 1 Daily INR *Anticipate discharge warfarin regimen will need to be adjusted to accommodate new INR goal   Tad Moore PharmD [phone] 02/28/2019 12:47 PM

## 2019-02-28 NOTE — Plan of Care (Signed)
  Problem: Consults Goal: RH STROKE PATIENT EDUCATION Description See Patient Education module for education specifics  Outcome: Progressing Goal: Nutrition Consult-if indicated Outcome: Progressing Goal: Diabetes Guidelines if Diabetic/Glucose > 140 Description If diabetic or lab glucose is > 140 mg/dl - Initiate Diabetes/Hyperglycemia Guidelines & Document Interventions  Outcome: Progressing   Problem: RH BOWEL ELIMINATION Goal: RH STG MANAGE BOWEL WITH ASSISTANCE Description STG Manage Bowel with min Assistance.  Outcome: Progressing Goal: RH STG MANAGE BOWEL W/MEDICATION W/ASSISTANCE Description STG Manage Bowel with Medication with min Assistance.  Outcome: Progressing   Problem: RH BLADDER ELIMINATION Goal: RH STG MANAGE BLADDER WITH ASSISTANCE Description STG Manage Bladder With min Assistance  Outcome: Progressing   Problem: RH SKIN INTEGRITY Goal: RH STG SKIN FREE OF INFECTION/BREAKDOWN Description Assess skin every shift  Outcome: Progressing Goal: RH STG MAINTAIN SKIN INTEGRITY WITH ASSISTANCE Description STG Maintain Skin Integrity With min Assistance.  Outcome: Progressing   Problem: RH SAFETY Goal: RH STG ADHERE TO SAFETY PRECAUTIONS W/ASSISTANCE/DEVICE Description STG Adhere to Safety Precautions With min Assistance/Device.  Outcome: Progressing Goal: RH STG DECREASED RISK OF FALL WITH ASSISTANCE Description STG Decreased Risk of Fall With min Assistance.  Outcome: Progressing   Problem: RH COGNITION-NURSING Goal: RH STG USES MEMORY AIDS/STRATEGIES W/ASSIST TO PROBLEM SOLVE Description STG Uses Memory Aids/Strategies With Assistance to Problem Solve. Outcome: Progressing Goal: RH STG ANTICIPATES NEEDS/CALLS FOR ASSIST W/ASSIST/CUES Description STG Anticipates Needs/Calls for Assist With Assistance/Cues. Outcome: Progressing   Problem: RH PAIN MANAGEMENT Goal: RH STG PAIN MANAGED AT OR BELOW PT'S PAIN GOAL Description Pain level less than 3  on scale 0-10  Outcome: Progressing   Problem: RH KNOWLEDGE DEFICIT Goal: RH STG INCREASE KNOWLEDGE OF HYPERTENSION Description Pt will be able to state at least one diet modification to prevent hypertension.  Outcome: Progressing Goal: RH STG INCREASE KNOWLEDGE OF DYSPHAGIA/FLUID INTAKE Description Pt will be able to maintain at least 60% of meal intake.  Outcome: Progressing Goal: RH STG INCREASE KNOWLEGDE OF HYPERLIPIDEMIA Description Pt will be able to name medication used to treat hyperlipidemia  Outcome: Progressing Goal: RH STG INCREASE KNOWLEDGE OF STROKE PROPHYLAXIS Description Pt will be able to state at least 2 medication used to prevent stroke.  Outcome: Progressing

## 2019-02-28 NOTE — Progress Notes (Signed)
Occupational Therapy Session Note  Patient Details  Name: Carrie [name] MRN: [id number] Date of Birth: October 12, 1940  Today's Date: 02/28/2019 OT Individual Time: 2025-4270 OT Individual Time Calculation (min): 69 min    Short Term Goals: Week 1:  OT Short Term Goal 1 (Week 1): STGs=LTGs due to ELOS  Skilled Therapeutic Interventions/Progress Updates:    Pt began session with transfer from supine to sit EOB with HOB elevated 25 degrees.  Mod assist needed to complete transfer and bring LEs off of the edge of bed and to scoot out.  Pt with increased dyspnea with activity, but O2 sats at 93%.  She completed functional mobility to the bathroom to use the toilet with min assist and increased time secondary to moving very slowly.  Pt demonstrated bladder incontinence on the way to the bathroom, which she was aware of.  Mod assist needed for toileting tasks with min assist for transfer out to the sink in the wheelchair.  She was able to wash her front and back peri area with min assist and then donn brief at the same level with increased time.  Mod assist for donning pants sit to stand.  She then completed grooming tasks of washing her face and hands as well as brushing her teeth with setup only from seated position.  Finished session with pt in the wheelchair at bedside with call button and phone in reach and safety alarm belt in place.    Therapy Documentation Precautions:  Precautions Precautions: Fall Restrictions Weight Bearing Restrictions: No   Pain: Pain Assessment Pain Scale: Faces Pain Score: 0-No pain Faces Pain Scale: Hurts a little bit Pain Type: Acute pain Pain Location: Heel Pain Orientation: Left Pain Descriptors / Indicators: Discomfort Pain Onset: Other (Comment)(when in bed) Pain Intervention(s): Emotional support;Repositioned Multiple Pain Sites: No ADL: See Care Tool Section for some details of ADL  Therapy/Group: Individual Therapy  Jatziri Goffredo OTR/L 02/28/2019,  10:29 AM

## 2019-02-28 NOTE — Progress Notes (Signed)
Inpatient [facility] Individual Statement of Services  Patient Name:  Carrie [name]  Date:  02/28/2019  Welcome to the [facility].  Our goal is to provide you with an individualized program based on your diagnosis and situation, designed to meet your specific needs.  With this comprehensive rehabilitation program, you will be expected to participate in at least 3 hours of rehabilitation therapies Monday-Friday, with modified therapy programming on the weekends.  Your rehabilitation program will include the following services:  Physical Therapy (PT), Occupational Therapy (OT), Speech Therapy (ST), Case Management (Social Worker), Rehabilitation Medicine, Nutrition Services and Pharmacy Services  Weekly team conferences will be held on Wednesdays to discuss your progress.  Your Social Worker will talk with you frequently to get your input and to update you on team discussions.  Team conferences with you and your family in attendance may also be held.  Expected length of stay: 8 to 10 days  Overall anticipated outcome: Supervision; contact guard assistance for bed mobility, car transfers, and ambulation; minimal assistance for toilet transfers, lower body dressing, and stairs.  Depending on your progress and recovery, your program may change. Your Social Worker will coordinate services and will keep you informed of any changes. Your Social Worker's name and contact numbers are listed  below.  The following services may also be recommended but are not provided by the [facility] will be made to provide these services after discharge if needed.  Arrangements include referral to agencies that provide these services.  Your insurance has been verified to be:  [employer] Your primary doctor is:  Dr. Lucianne Lei  Pertinent  information will be shared with your doctor and your insurance company.  Social Worker:  Alfonse Alpers, LCSW  [phone] or [phone]  Information discussed with and copy given to patient by: Trey Sailors, 02/28/2019, 4:02 PM

## 2019-02-28 NOTE — Progress Notes (Signed)
Social Work Assessment and Plan  Patient Details  Name: Carrie [name] MRN: [id number] Date of Birth: 05/25/1941  Today's Date: 02/28/2019  Problem List:  Patient Active Problem List   Diagnosis Date Noted  . 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 Embolic stroke ([place]) 00/71/2197  . Acute right MCA stroke ([place])   . Cerebrovascular accident (CVA) ([place])   . Sleep disturbance   . Acute on chronic anemia   . Subtherapeutic international normalized ratio (INR) 02/22/2019  . Dysphagia following cerebrovascular accident (CVA) 02/22/2019  . Malnutrition ([place]) 02/22/2019  . Encounter for intubation   . Acute respiratory failure ([place])   . 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 CHF exacerbation ([place]) 02/03/2019  . CHF (congestive heart failure) ([place]) 02/02/2019  . SOB (shortness of breath) 02/02/2019  . Diarrhea 03/31/2018  . Abnormal LFTs 03/31/2018  . Hemoptysis 03/30/2018  . Elevated troponin 03/30/2018  . CKD (chronic kidney disease), stage III ([place]) 03/30/2018  . HLD (hyperlipidemia) 03/30/2018  . Blurry vision 03/30/2018  . Status post coronary artery stent placement   . Chronic diastolic heart failure ([place])   . History of mitral valve replacement with mechanical valve   . Hypertensive heart disease with heart failure ([place])   . Pure hypercholesterolemia   . Coronary artery disease involving native coronary artery of native heart without angina pectoris 08/11/2016  . PAH (pulmonary artery hypertension) ([place]) 05/05/2016  . Anemia   . AKI (acute kidney injury) ([place])   . Hyponatremia 03/12/2016  . Chest pain 03/11/2016  . NSTEMI (non-ST elevated myocardial infarction) ([place]) 03/11/2016  . 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 Thrombocytopenia ([place]) 12/03/2014  . SSS (sick sinus syndrome) ([place]) 07/24/2014  . Longstanding persistent atrial fibrillation 07/24/2014  . Pacemaker 07/24/2014   . Tachycardia, a fib with RVR  04/03/2013  . Essential hypertension 04/03/2013  . SAH (subarachnoid hemorrhage), December 2012 03/21/2012  . Hemorrhage, hepatic, Rx'd with Coumadin reversal and embolisation 03/17/12 03/17/2012  . 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 Chronic anticoagulation, ( INR goal 2.0-2.5 due to history of subdural hematoma and liver hemorrhage) 09/09/2011  . Muscle weakness of lower extremity 09/08/2011  . Pacemaker - single chamber Medtronic Adapta, 2008 09/08/2011  . Atrial fibrillation ([place]) 09/06/2011  . S/P mitral valve replacement, St Jude 09/06/2011  . CONSTIPATION 02/12/2009   Past Medical History:  Past Medical History:  Diagnosis Date  . Anticoagulated on Coumadin    for mech valve and atrial fib  goal 2.0-2.5  . Anxiety   . Arthritis    "right shoulder" (03/15/2013)  . Atrial fibrillation ([place])   . Atrial flutter ([place])   . CAD (coronary artery disease) 08/11/2016  . Chronic combined systolic and diastolic CHF (congestive heart failure) ([place])    a. 02/2016 Echo: EF 45-50%.  . Diverticula of colon   . GERD (gastroesophageal reflux disease)   . Hemorrhage intraabdominal 03/17/2012  . Hyperlipidemia   . Hypertension   . Liver hemorrhage   . Migraines   . 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 Bi-leaflet St. Jude mechanical prosthesis; b. 02/2016 Echo: EF 45-50%, some degree of MR, sev dil LA/RA, sev TR.  . NSTEMI (non-ST elevated myocardial infarction) ([place])    a. 02/2016 elev trop/Cath: nonobs dzs,   . Pacemaker    medtronic adapta  . Severe tricuspid regurgitation    a. 02/2016 Echo: Ef 45-50%, sev  TR, PASP 61mHg.  . Sick sinus syndrome ([place]    Dr GCristopher Peru EP study negative. Pacemaker 12/15/06 Medtronic  . Stroke ([facility]    "they say I had a stroke last year" denies residual on 03/15/2013  . Subdural hematoma ([facility]    Past Surgical History:  Past Surgical History:  Procedure Laterality Date  .  APPENDECTOMY    . CARDIAC CATHETERIZATION  04/02/97   R&L:severe MR/pulmonary hypertension  . CARDIAC CATHETERIZATION N/A 03/16/2016   Procedure: Left Heart Cath and Coronary Angiography;  Surgeon: JJettie Booze MD;  [facility];  Service: Cardiovascular;  Laterality: N/A;  . CARDIAC CATHETERIZATION N/A 09/30/2016   Procedure: Left Heart Cath and Coronary Angiography;  Surgeon: MWellington Hampshire MD;  [facility];  Service: Cardiovascular;  Laterality: N/A;  . CARDIAC CATHETERIZATION N/A 09/30/2016   Procedure: Coronary Stent Intervention;  Surgeon: MWellington Hampshire MD;  3.5 x 12 mm resolute Onyx  to ostial RCA  . CATARACT EXTRACTION W/ INTRAOCULAR LENS  IMPLANT, BILATERAL Bilateral 2013  . CORONARY ANGIOPLASTY    . DILATION AND CURETTAGE OF UTERUS     "had fibroids" (03/15/2013)  . EXPLORATORY LAPAROTOMY     "had a growth on my intestines" (03/15/2013)  . HAMMER TOE SURGERY Right   . INSERT / REPLACE / REMOVE PACEMAKER  12/15/2006   Medtronic  . IR ANGIO VERTEBRAL SEL SUBCLAVIAN INNOMINATE UNI R MOD SED  02/17/2019  . IR CT HEAD LTD  02/17/2019  . IR PERCUTANEOUS ART THROMBECTOMY/INFUSION INTRACRANIAL INC DIAG ANGIO  02/17/2019  . MITRAL VALVE REPLACEMENT  1998   St Jude mechanical; Dr. GServando Snare . PACEMAKER PLACEMENT  12/15/06   medtronic adapta for SSS  . PERMANENT PACEMAKER GENERATOR CHANGE N/A 07/24/2014   Procedure: PERMANENT PACEMAKER GENERATOR CHANGE;  Surgeon: MSanda Klein MD;  [facility];  Service: Cardiovascular;  Laterality: N/A;  . PERSANTINE CARDIOLITE  08/07/03   mild inf. ischemia   . RADIOLOGY WITH ANESTHESIA N/A 02/17/2019   Procedure: IR WITH ANESTHESIA;  Surgeon: DLuanne Bras MD;  [facility]: [facility]  Service: Radiology;  Laterality: N/A;  . RIGHT HEART CATH N/A 02/06/2019   Procedure: RIGHT HEART CATH;  Surgeon: BJolaine Artist MD;  [facility];  Service: Cardiovascular;  Laterality: N/A;  . TEE  WITHOUT CARDIOVERSION  09/23/2011   Procedure: TRANSESOPHAGEAL ECHOCARDIOGRAM (TEE);  Surgeon: KPixie Casino  Location: [facility];  Service: Cardiovascular;  Laterality: N/A;  . TONSILLECTOMY    . TUBAL LIGATION    . UKoreaECHOCARDIOGRAPHY  11/19/2011   EF 50-55%,RA mod to severely dilated,LA severely dilated,trace MR,small vegetation or mass on the MV,AOV mildly scleroticmild PI, RV pressure 40-572mg   Social History:  reports that she has never smoked. She has never used smokeless tobacco. She reports that she does not drink alcohol or use drugs.  Family / Support Systems Marital Status: Married How Long?: 5830ears Patient Roles: Spouse, Parent, Other (Comment)(neighbor; friend; church member; sister) Spouse/Significant Other: Carrie [name] husband - [phone]); ([phone]) Children: 2 grown children - [place] Caregiver: pt's spouse, JaJeneen Rinksbility/Limitations of Caregiver: Husband just had back/bladder surgery, can provide supervision to [facility] Admissions Coordinator.  Pt told CSW that he heals quickly and he feels he can provide the min A pt needs for some tasks.  He was doing these things for pt PTA. Caregiver Availability: 24/7  Family Dynamics: close, supportive husband/family  Social History Preferred language: English Religion: Baptist Cultural Background: [employer] Read: Yes Write: Yes Employment Status: Retired Public relations account executive Issues: none reported Guardian/Conservator: MD has determined that pt is not fully capable of making her own decisions.     Abuse/Neglect Abuse/Neglect Assessment Can Be Completed: Yes Physical Abuse: Denies Verbal Abuse: Denies Sexual Abuse: Denies Exploitation of patient/patient's resources: Denies Self-Neglect: Denies  Emotional Status Pt's affect, behavior and adjustment status: Pt was quiet and took extra time to answer questions, but pt was pleasant with  CSW and reports feeling well emotionally. Recent Psychosocial Issues: husband had surgery in the last couple of months - mass removed and no need for further cancer treatments Psychiatric History: none reported Substance Abuse History: none reported  Patient / Family Perceptions, Expectations & Goals Pt/Family understanding of illness & functional limitations: Pt/husband report a good understanding of pt's condition and limitations.  Husband reports helping pt PTA with many of the tasks she will need help with now. Premorbid pt/family roles/activities: Pt likes to garden and go to church. Anticipated changes in roles/activities/participation: Pt hopes to resume these activities as she is able. Pt/family expectations/goals: Pt wants to get home to her husband and her flowers.  Community Resources Express Scripts: None Transportation available at discharge: husband Resource referrals recommended: Support group (specify)(stroke support group)  Discharge Planning Living Arrangements: Spouse/significant other Support Systems: Spouse/significant other, Other relatives, Children, Church/faith community Type of Residence: Private residence Google Resources: Multimedia programmer (specify)(United Electrical engineer) Financial Resources: Radio broadcast assistant Screen Referred: No Money Management: Spouse Does the patient have any problems obtaining your medications?: No Home Management: Pt's husband takes care of all of this. Patient/Family Preliminary Plans: Pt plans to return to her home where her husband reports that he can provide up to min A. Social Work Anticipated Follow Up Needs: HH/OP, Support Group Expected length of stay: 8 to 10 days  Clinical Impression CSW met with pt and we called husband on the phone so CSW could introduce self and role of CSW, as well as to complete assessment.  Husband is very devoted to pt and misses having her home with him. He has been helping her  physically for a while and does all of the cleaning and cooking.  He feel he can handle her care at home, even with his recent surgery.  He's open to coming in for family education.  Pt reports feeling well emotionally and is ready to work on CIR so that she can return home.  Pt has no concerns/questions/needs at this time.  CSW will continue to follow and assist as needed.  Dempsey Ahonen, Silvestre Mesi 02/28/2019, 4:32 PM

## 2019-03-01 ENCOUNTER — Inpatient Hospital Stay (HOSPITAL_COMMUNITY): Payer: Medicare Other

## 2019-03-01 ENCOUNTER — Inpatient Hospital Stay (HOSPITAL_COMMUNITY): Payer: Medicare Other | Admitting: Occupational Therapy

## 2019-03-01 LAB — PROTIME-INR
INR: 1.5 — ABNORMAL HIGH (ref 0.8–1.2)
Prothrombin Time: 18.2 seconds — ABNORMAL HIGH (ref 11.4–15.2)

## 2019-03-01 LAB — OCCULT BLOOD X 1 CARD TO LAB, STOOL: Fecal Occult Bld: NEGATIVE

## 2019-03-01 MED ORDER — WARFARIN SODIUM 7.5 MG PO TABS
7.5000 mg | ORAL_TABLET | Freq: Once | ORAL | Status: AC
  Administered 2019-03-01: 7.5 mg via ORAL
  Filled 2019-03-01: qty 1

## 2019-03-01 NOTE — Progress Notes (Signed)
Occupational Therapy Session Note  Patient Details  Name: Carrie [name] MRN: [id number] Date of Birth: [birth year]  Today's Date: 03/01/2019 OT Individual Time: 0177-9390 OT Individual Time Calculation (min): 66 min    Short Term Goals: Week 1:  OT Short Term Goal 1 (Week 1): STGs=LTGs due to ELOS  Skilled Therapeutic Interventions/Progress Updates:    Pt completed bathing and dressing sit to stand at the sink during session.  She was able to complete UB bathing with supervision as well as dressing.  She needed min assist for LB bathing sit to stand and for cleaning peri area in standing.  Noted bladder incontinence in standing as well.  She completed donning LB clothing with mod assist and increased time, with therapist assist to help cross his LEs and maintain in order to thread clothing.  She was able to complete functional mobility with use of the RW from the wheelchair with min assist and mod demonstrational cueing for upright posture.  LOB noted to the left in standing with min assist to correct.  She ambulated down the hallway and back to the room to complete session approximately 90 ft.  Finished session with pt in the wheelchair with call button and phone in reach and safety alarm belt in place.    Therapy Documentation Precautions:  Precautions Precautions: Fall Restrictions Weight Bearing Restrictions: No  Pain: Pain Assessment Pain Scale: Faces Faces Pain Scale: Hurts a little bit Pain Type: Acute pain Pain Location: Head Pain Orientation: Right;Anterior Pain Descriptors / Indicators: Discomfort Pain Onset: With Activity Pain Intervention(s): Repositioned ADL: See Care Tool Section for some details of ADLs  Therapy/Group: Individual Therapy  Nejla Reasor OTR/L 03/01/2019, 11:24 AM

## 2019-03-01 NOTE — Progress Notes (Signed)
ANTICOAGULATION CONSULT NOTE   Pharmacy Consult for Warfarin Indication: atrial fibrillation and mechanical valve  Allergies  Allergen Reactions  . Codeine Nausea And Vomiting    Patient Measurements: Height: 5\' 1"  (154.9 cm) Weight: 119 lb 8.2 oz (54.2 kg) IBW/kg (Calculated) : 47.8  Vital Signs: Temp: 98 F (36.7 C) (06/10 0355) Temp Source: Oral (06/10 0355) BP: 109/56 (06/10 0355) Pulse Rate: 78 (06/10 0806)  Labs: Recent Labs    02/27/19 0842 02/28/19 0511 03/01/19 0643  HGB 8.5*  --   --   HCT 24.8*  --   --   PLT 174  --   --   LABPROT 17.5* 18.2* 18.2*  INR 1.5* 1.5* 1.5*  CREATININE 1.44*  --   --     Estimated Creatinine Clearance: 24.7 mL/min (A) (by C-G formula based on SCr of 1.44 mg/dL (H)).  Assessment: 36 yof s/p MCA M1 occlusion s/p IR revascularization. She is on chronic warfarin for afib and mechanical valve. INR on admission was subtherapeutic at 1.9 so she was initiated on IV heparin as a bridge back to therapeutic warfarin. Unfortunately she developed an SDH and SAH. Heparin was reversed and warfarin held.   Warfarin resumed on 6/5 with new INR goal of 2-2.5.  INR slow to increase - > 1.5 again today  Goal of Therapy:  INR 2-2.5 Monitor platelets by anticoagulation protocol: Yes   Plan:  Repeat Warfarin 7.5 mg po x 1 Daily INR *Anticipate discharge warfarin regimen will need to be adjusted to accommodate new INR goal   Tad Moore PharmD [phone] 03/01/2019 11:21 AM

## 2019-03-01 NOTE — Progress Notes (Signed)
Physical Therapy Session Note  Patient Details  Name: Carrie [name] MRN: [id number] Date of Birth: 1941/02/27  Today's Date: 03/01/2019 PT Individual Time: 0802-0900 and 1330-1430 PT Individual Time Calculation (min): 58 min and 60 min    Short Term Goals: Week 1:  PT Short Term Goal 1 (Week 1): Pt will perform supine<>sit with min assist PT Short Term Goal 2 (Week 1): Pt will perform bed<>chair transfers using LRAD with CGA PT Short Term Goal 3 (Week 1): Pt will ambulate at least 44ft using LRAD with CGA PT Short Term Goal 4 (Week 1): Pt will ascend/descend 2 steps using unilateral handrail or LRAD per home set-up with CGA  Skilled Therapeutic Interventions/Progress Updates:    Session 1:  Pt supine in bed upon PT arrival, agreeable to therapy tx and denies pain. Pt reports having to use the bathroom. Pt transferred to sitting EOB with min assist and increased time, cues for techniques. Pt performed sit<>stand with min assist and RW, ambulated x 10 ft into bathroom with RW and min assist, decreased gait speed, cues for increased step length. Pt transferred to toilet, noted to be incontinent of bladder with a soiled brief. Pt also able to void more once on the toilet, continent of bladder. Pt maintained standing balance with use of grab bar and min assist while pt performed pericare with set up assist, therapist donned clean brief total assist. Pt ambulated x 10 ft to w/c with min assist. Pt seated in w/c while therapist looped LEs through pants, pt performed sit<>Stand min assist and pulled pants over hips. Pt seated in w/c at the sink brushed teeth and washed face this session with set up assist. Pt transported to the gym and ambulated x 80 ft this session with RW and min assist, cues for upright posture and increased step length. Pt performed x 5 sit<>stands this session from mat without AD working on LE strengthening and powering up. Pt worked on standing balance this session without AD while  performing horseshoe toss activity x 2 trials, CGA. Pt performed stand pivot to w/c and transported back to room, left in w/c with needs in reach and chair alarm set.   Session 2: Pt seated in w/c upon PT arrival, agreeable to therapy tx and denies pain. Pt reports having to use the bathroom. Pt transferred to sitting EOB with min assist and increased time, cues for techniques. Pt performed sit<>stand with min assist and RW, ambulated x 10 ft into bathroom with RW and min assist, decreased gait speed, cues for increased step length. Pt transferred to toilet, noted to be incontinent of bladder with a soiled brief. Pt also able to void more once on the toilet, continent of bladder and bowel. Pt maintained standing balance with use of grab bar and min assist while pt performed pericare with set up assist, therapist donned clean brief total assist, pt pulled pants over hips min assist. Pt ambulated x 10 ft to w/c with min assist. Pt seated in w/c at sink to wash hands. Therapist donned shoes for time management. Pt transported to dayroom. Pt used nustep this session x5 minutes for global strengthening and endurance, cues for increased strides. Pt ambulated x 100 ft this session with RW and min assist, cues for increased step length, more cues needed during turn to sit. Pt worked on dynamic standing balance and step length to perform forwards/backwards ambulation with RW x 20 ft in each direction with increased cues during backwards gait for increased  step length, min assist.  Pt worked on sidestepping with UE support on the rail in hallway 2 x 10 ft in each direction with cues for increased step length, min assist with facilitation for lateral weightshifting. Pt transported back to room and left in w/c with needs in reach and chair alarm set.     Therapy Documentation Precautions:  Precautions Precautions: Fall Restrictions Weight Bearing Restrictions: No    Therapy/Group: Individual Therapy  Netta Corrigan, PT, DPT 03/01/2019, 7:52 AM

## 2019-03-01 NOTE — Progress Notes (Signed)
[place] PHYSICAL MEDICINE & REHABILITATION PROGRESS NOTE   Subjective/Complaints:  Poor po fluid intake per RN, poor balance, slow processing speed  ROS- denies CP, SOB, N/V/D, denies hemoptysis  Objective:   No results found. Recent Labs    02/27/19 0842  WBC 5.5  HGB 8.5*  HCT 24.8*  PLT 174   Recent Labs    02/27/19 0842  NA 131*  K 3.6  CL 99  CO2 22  GLUCOSE 128*  BUN 40*  CREATININE 1.44*  CALCIUM 8.8*    Intake/Output Summary (Last 24 hours) at 03/01/2019 1116 Last data filed at 03/01/2019 0730 Gross per 24 hour  Intake 660 ml  Output -  Net 660 ml     Physical Exam: Vital Signs Blood pressure (!) 109/56, pulse 78, temperature 98 F (36.7 C), temperature source Oral, resp. rate 19, height 5' 1"  (1.549 m), weight 54.2 kg, SpO2 95 %.   General: No acute distress Mood and affect are appropriate Heart: Regular rate and rhythm no rubs murmurs or extra sounds Lungs: Clear to auscultation, breathing unlabored, no rales or wheezes Abdomen: Positive bowel sounds, soft nontender to palpation, nondistended Extremities: No clubbing, cyanosis, or edema Skin: No evidence of breakdown, no evidence of rash Neurologic: Cranial nerves II through XII intact, motor strength is 5/5 in bilateral deltoid, bicep, tricep, grip, hip flexor, knee extensors, ankle dorsiflexor and plantar flexor Sensory exam normal sensation to light touch and proprioception in bilateral upper and lower extremities Cerebellar exam normal finger to nose to finger as well as heel to shin in bilateral upper and lower extremities Musculoskeletal: Full range of motion in all 4 extremities. No joint swelling   Assessment/Plan: 1. Functional deficits secondary to Right post fossa SDH  which require 3+ hours per day of interdisciplinary therapy in a comprehensive inpatient rehab setting.  Physiatrist is providing close team supervision and 24 hour management of active medical problems listed  below.  Physiatrist and rehab team continue to assess barriers to discharge/monitor patient progress toward functional and medical goals  Care Tool:  Bathing    Body parts bathed by patient: Right arm, Left arm, Chest, Abdomen, Front perineal area, Buttocks, Face, Left lower leg, Right lower leg, Left upper leg, Right upper leg   Body parts bathed by helper: Right lower leg, Left lower leg     Bathing assist Assist Level: Minimal Assistance - Patient > 75%     Upper Body Dressing/Undressing Upper body dressing   What is the patient wearing?: Pull over shirt    Upper body assist Assist Level: Supervision/Verbal cueing    Lower Body Dressing/Undressing Lower body dressing      What is the patient wearing?: Incontinence brief, Pants     Lower body assist Assist for lower body dressing: Moderate Assistance - Patient 50 - 74%     Toileting Toileting Toileting Activity did not occur Landscape architect and hygiene only): Refused  Toileting assist Assist for toileting: Moderate Assistance - Patient 50 - 74%     Transfers Chair/bed transfer  Transfers assist     Chair/bed transfer assist level: Minimal Assistance - Patient > 75%     Locomotion Ambulation   Ambulation assist      Assist level: Contact Guard/Touching assist Assistive device: Walker-rolling Max distance: 80 ft   Walk 10 feet activity   Assist     Assist level: Contact Guard/Touching assist Assistive device: Walker-rolling   Walk 50 feet activity   Assist    Assist  level: Contact Guard/Touching assist Assistive device: Walker-rolling    Walk 150 feet activity   Assist Walk 150 feet activity did not occur: Safety/medical concerns         Walk 10 feet on uneven surface  activity   Assist Walk 10 feet on uneven surfaces activity did not occur: Safety/medical concerns         Wheelchair     Assist Will patient use wheelchair at discharge?: (TBD )              Wheelchair 50 feet with 2 turns activity    Assist            Wheelchair 150 feet activity     Assist          Medical Problem List and Plan: 1.  Balance deficits and weakness affecting ADLs and mobility  secondary to cardioembolic stroke complicated by SDH/SAH right posterior fossa              CIR PT, OT, SLP  Team conference today please see physician documentation under team conference tab, met with team face-to-face to discuss problems,progress, and goals. Formulized individual treatment plan based on medical history, underlying problem and comorbidities. 2.  St Jude's Mitral valve/A fib/ Antithrombotics: -DVT/anticoagulation:  Pharmaceutical: Coumadin              INR subtherapeutic at present             -antiplatelet therapy: low dose ASA 3. Pain Management: tylenol prn.  4. Mood: LCSW to follow for evaluation and support.  Team to provide ego support.              -antipsychotic agents: NA 5. Neuropsych: This patient is not fully capable of making decisions on her own behalf. 6. Skin/Wound Care: routine pressure relief measures.  7. Fluids/Electrolytes/Nutrition: Monitor I/O. Intake improving.  8. CAD/Chronic diastolic YSH:UOHFGBMS heart disease with tricuspid regurg, MV replacement  Heart Healthy diet. SOB improving.  Strict I/O's. Continue lasix bid, spironolactone, lopressor with low dose ASA. Filed Weights   02/17/19 1100 02/24/19 0500  Weight: 59 kg 58.8 kg  9. Pneumonitis with hematemesis/Hypoxia: Continue IV protonix. Will monitor for now.              Wean supplemental oxygen as tolerated 10. Chronic A fib/ SSS s/p PPM: Monitor HR tid--continue amiodarone. On coumdin.             Monitor with increased mobility 11. Acute on chronic anemia?:  Check iron panel.  Follow with serial checks. Transfuse if Hgb < 7.0.  Add iron supplement. Had hemoptysis but not ongoing,  stool guaic neg 6/6 12. CKD: Baseline SCr-1.3? Encourage fluid intake.  creat mildly  elevated over baseline at 1.44 13. Sleep disturbance:  Add trazodone prn.       LOS: 5 days A FACE TO FACE EVALUATION WAS PERFORMED  Charlett Blake 03/01/2019, 11:16 AM

## 2019-03-02 ENCOUNTER — Inpatient Hospital Stay (HOSPITAL_COMMUNITY): Payer: Medicare Other | Admitting: Occupational Therapy

## 2019-03-02 ENCOUNTER — Inpatient Hospital Stay (HOSPITAL_COMMUNITY): Payer: Medicare Other | Admitting: Physical Therapy

## 2019-03-02 LAB — PROTIME-INR
INR: 1.6 — ABNORMAL HIGH (ref 0.8–1.2)
Prothrombin Time: 18.5 seconds — ABNORMAL HIGH (ref 11.4–15.2)

## 2019-03-02 MED ORDER — TOPIRAMATE 25 MG PO TABS
25.0000 mg | ORAL_TABLET | Freq: Two times a day (BID) | ORAL | Status: DC
  Administered 2019-03-02 – 2019-03-08 (×13): 25 mg via ORAL
  Filled 2019-03-02 (×13): qty 1

## 2019-03-02 MED ORDER — WARFARIN SODIUM 7.5 MG PO TABS
7.5000 mg | ORAL_TABLET | Freq: Once | ORAL | Status: AC
  Administered 2019-03-02: 7.5 mg via ORAL
  Filled 2019-03-02: qty 1

## 2019-03-02 MED ORDER — OXYBUTYNIN CHLORIDE 5 MG PO TABS
2.5000 mg | ORAL_TABLET | Freq: Every day | ORAL | Status: DC
  Administered 2019-03-02 – 2019-03-07 (×6): 2.5 mg via ORAL
  Filled 2019-03-02 (×6): qty 1

## 2019-03-02 NOTE — Progress Notes (Signed)
ANTICOAGULATION CONSULT NOTE   Pharmacy Consult for Warfarin Indication: atrial fibrillation and mechanical valve  Allergies  Allergen Reactions  . Codeine Nausea And Vomiting    Patient Measurements: Height: 5\' 1"  (154.9 cm) Weight: 119 lb 12.1 oz (54.3 kg) IBW/kg (Calculated) : 47.8  Vital Signs: Temp: 98.2 F (36.8 C) (06/11 0305) Temp Source: Oral (06/11 0305) BP: 121/50 (06/11 0305) Pulse Rate: 68 (06/11 0305)  Labs: Recent Labs    02/27/19 0842 02/28/19 0511 03/01/19 0643 03/02/19 0605  HGB 8.5*  --   --   --   HCT 24.8*  --   --   --   PLT 174  --   --   --   LABPROT 17.5* 18.2* 18.2* 18.5*  INR 1.5* 1.5* 1.5* 1.6*  CREATININE 1.44*  --   --   --     Estimated Creatinine Clearance: 24.7 mL/min (A) (by C-G formula based on SCr of 1.44 mg/dL (H)).  Assessment: 58 yof s/p MCA M1 occlusion s/p IR revascularization. She is on chronic warfarin for afib and mechanical valve. INR on admission was subtherapeutic at 1.9 so she was initiated on IV heparin as a bridge back to therapeutic warfarin. Unfortunately she developed an SDH and SAH. Heparin was reversed and warfarin held. Warfarin was resumed 6/5 without bridging and lower INR goal of 2-2.5.   INR is increasing slowly however remains SUBtherapeutic (INR 1.6 << 1.5, goal of 2-2.5). No CBC today - noted orders placed for 6/12 AM.  Goal of Therapy:  INR 2-2.5 Monitor platelets by anticoagulation protocol: Yes   Plan:  - Repeat Warfarin 7.5 mg po x 1 - Daily INR *Anticipate discharge warfarin regimen will need to be adjusted to accommodate new INR goal  Thank you for allowing pharmacy to be a part of this patient's care.  Alycia Rossetti, PharmD, BCPS Clinical Pharmacist Clinical phone for 03/02/2019: [phone] 03/02/2019 8:35 AM   **Pharmacist phone directory can now be found on [email or web address] ([email or web address]).  Listed under [place].

## 2019-03-02 NOTE — Progress Notes (Signed)
Social Work Patient ID: Carrie [name], female   DOB: 1940-11-26, 78 y.o.   MRN: [id number]   CSW met with pt and spoke with husband via telephone to update them on team conference discussion and targeted d/c date of 03-08-19. Husband is pleased that pt is coming home next week.  He will come in for family education 03-03-19 to see how pt is doing and what she may need help with.  CSW will arrange [facility] and order DME closer to d/c.  CSW remains available to assist as needed.

## 2019-03-02 NOTE — Progress Notes (Signed)
Occupational Therapy Session Note  Patient Details  Name: Carrie [name] MRN: [id number] Date of Birth: [birth year]  Today's Date: 03/02/2019 OT Individual Time: 5248-1859 OT Individual Time Calculation (min): 70 min    Short Term Goals: Week 1:  OT Short Term Goal 1 (Week 1): STGs=LTGs due to ELOS  Skilled Therapeutic Interventions/Progress Updates:    Pt completed tub/shower transfers to start session with use of the tub/shower bench and RW for support.  Increased time and mod assist needed to transfer into the shower with mod assist needed for lifting her LEs over the edge of the tub and for scooting in.  She needed mod assist for transfer out as well and to not slide too far forward on the front seat edge when working on lifting her LEs over out of the tub.  She then completed toilet transfer from wheelchair to the 3:1 over the toilet with min assist.  Clothing management completed with min assist as well as toilet hygiene.  She then transferred from the wheelchair in the dayroom to the Nustep for strengthening and endurance building.  She was able to complete the first 6 min interval with resistance on level 5 and average number of steps at 30.  The 2nd set was completed on level 6 with average number of steps at 30 per minute.  Returned to room at end of session with pt up in the wheelchair and call button and phone in reach with safety belt alarm in place.    Therapy Documentation Precautions:  Precautions Precautions: Fall Restrictions Weight Bearing Restrictions: No  Pain: Pain Assessment Pain Score: 0-No pain Faces Pain Scale: Hurts a little bit Pain Type: Acute pain Pain Location: Buttocks Pain Orientation: Medial Pain Descriptors / Indicators: Discomfort Pain Onset: On-going Pain Intervention(s): Repositioned ADL: See Care Tool Section for some details of ADL  Therapy/Group: Individual Therapy  Dmarius Reeder OTR/L  03/02/2019, 2:14 PM

## 2019-03-02 NOTE — Progress Notes (Signed)
[facility] PHYSICAL MEDICINE & REHABILITATION PROGRESS NOTE   Subjective/Complaints:  Frequent urination last night.  Continues to have headaches has had episode where she saw a pinwheel out of the corner of her eye during a headache.  ROS- denies CP, SOB, N/V/D, denies hemoptysis  Objective:   No results found. No results for input(s): WBC, HGB, HCT, PLT in the last 72 hours. No results for input(s): NA, K, CL, CO2, GLUCOSE, BUN, CREATININE, CALCIUM in the last 72 hours.  Intake/Output Summary (Last 24 hours) at 03/02/2019 1611 Last data filed at 03/02/2019 0700 Gross per 24 hour  Intake 360 ml  Output -  Net 360 ml     Physical Exam: Vital Signs Blood pressure 131/63, pulse 80, temperature 98 F (36.7 C), resp. rate 20, height 5\' 1"  (1.549 m), weight 54.3 kg, SpO2 97 %.   General: No acute distress Mood and affect are appropriate Heart: Regular rate and rhythm no rubs murmurs or extra sounds Lungs: Clear to auscultation, breathing unlabored, no rales or wheezes Abdomen: Positive bowel sounds, soft nontender to palpation, nondistended Extremities: No clubbing, cyanosis, or edema Skin: No evidence of breakdown, no evidence of rash Neurologic: Cranial nerves II through XII intact, motor strength is 5/5 in bilateral deltoid, bicep, tricep, grip, hip flexor, knee extensors, ankle dorsiflexor and plantar flexor Sensory exam normal sensation to light touch and proprioception in bilateral upper and lower extremities Cerebellar exam normal finger to nose to finger as well as heel to shin in bilateral upper and lower extremities Musculoskeletal: Full range of motion in all 4 extremities. No joint swelling   Assessment/Plan: 1. Functional deficits secondary to Right post fossa SDH  which require 3+ hours per day of interdisciplinary therapy in a comprehensive inpatient rehab setting.  Physiatrist is providing close team supervision and 24 hour management of active medical problems  listed below.  Physiatrist and rehab team continue to assess barriers to discharge/monitor patient progress toward functional and medical goals  Care Tool:  Bathing    Body parts bathed by patient: Right arm, Left arm, Chest, Abdomen, Front perineal area, Buttocks, Right upper leg, Face, Left lower leg, Right lower leg, Left upper leg   Body parts bathed by helper: Right lower leg, Left lower leg     Bathing assist Assist Level: Minimal Assistance - Patient > 75%     Upper Body Dressing/Undressing Upper body dressing   What is the patient wearing?: Pull over shirt    Upper body assist Assist Level: Supervision/Verbal cueing    Lower Body Dressing/Undressing Lower body dressing      What is the patient wearing?: Pants     Lower body assist Assist for lower body dressing: Minimal Assistance - Patient > 75%     Toileting Toileting Toileting Activity did not occur Landscape architect and hygiene only): Refused  Toileting assist Assist for toileting: Minimal Assistance - Patient > 75%     Transfers Chair/bed transfer  Transfers assist     Chair/bed transfer assist level: Minimal Assistance - Patient > 75%     Locomotion Ambulation   Ambulation assist      Assist level: Minimal Assistance - Patient > 75% Assistive device: Walker-rolling Max distance: 90'   Walk 10 feet activity   Assist     Assist level: Contact Guard/Touching assist Assistive device: Walker-rolling   Walk 50 feet activity   Assist    Assist level: Contact Guard/Touching assist Assistive device: Walker-rolling    Walk 150 feet activity  Assist Walk 150 feet activity did not occur: Safety/medical concerns         Walk 10 feet on uneven surface  activity   Assist Walk 10 feet on uneven surfaces activity did not occur: Safety/medical concerns         Wheelchair     Assist Will patient use wheelchair at discharge?: (TBD )             Wheelchair 50  feet with 2 turns activity    Assist            Wheelchair 150 feet activity     Assist          Medical Problem List and Plan: 1.  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  St Jude's Mitral valve/A fib/ Antithrombotics: -DVT/anticoagulation:  Pharmaceutical: Coumadin              INR subtherapeutic at present             -antiplatelet therapy: low dose ASA 3. Pain Management: tylenol prn.  4. Mood: LCSW to follow for evaluation and support.  Team to provide ego support.              -antipsychotic agents: NA 5. Neuropsych: This patient is not fully capable of making decisions on her own behalf. 6. Skin/Wound Care: routine pressure relief measures.  7. Fluids/Electrolytes/Nutrition: Monitor I/O. Intake improving.  8. CAD/Chronic diastolic CNO:[id number] heart disease with tricuspid regurg, MV replacement  Heart Healthy diet. SOB improving.  Strict I/O's. Continue lasix bid, spironolactone, lopressor with low dose ASA. Filed Weights   02/17/19 1100 02/24/19 0500  Weight: 59 kg 58.8 kg  9. Pneumonitis with hematemesis/Hypoxia: Continue IV protonix. Will monitor for now.              Wean supplemental oxygen as tolerated 10. Chronic A fib/ SSS s/p PPM: Monitor HR tid--continue amiodarone. On coumadin.             Monitor with increased mobility 11. Acute on chronic anemia?:  Check iron panel.  Follow with serial checks. Transfuse if Hgb < 7.0.  Add iron supplement. Had hemoptysis but not ongoing,  stool guaic neg 6/6 12. CKD: Baseline SCr-1.3? Encourage fluid intake.  creat mildly elevated over baseline at 1.44 13. Sleep disturbance:  Add trazodone prn.       LOS: 6 days A FACE TO FACE EVALUATION WAS PERFORMED  Charlett Blake 03/02/2019, 4:11 PM

## 2019-03-02 NOTE — Progress Notes (Signed)
Occupational Therapy Session Note  Patient Details  Name: Carrie [name] MRN: [id number] Date of Birth: 02-12-1941  Today's Date: 03/02/2019 OT Individual Time: 1102-1201 OT Individual Time Calculation (min): 59 min    Short Term Goals: Week 1:  OT Short Term Goal 1 (Week 1): STGs=LTGs due to ELOS  Skilled Therapeutic Interventions/Progress Updates:    Pt completed bathing and dressing sit to stand at the sink.  Supervision for UB bathing and dressing with min assist for LB sit to stand.  Carrie [name] was able to cross her LEs without assistance for washing her feet and for donning her pants.  Carrie [name] did need min assist for maintaining them up and donning gripper socks however.  Min instructional cueing for hand placement with sit to stand and min guard for standing balance when washing her front and back peri area.  Carrie [name] donned UB clothing with setup as well.  Finished session with call button and phone in reach and chair alarm in place.    Therapy Documentation Precautions:  Precautions Precautions: Fall Restrictions Weight Bearing Restrictions: No  Pain: Pain Assessment Pain Score: 0-No pain ADL: See Care Tool for details of ADL  Therapy/Group: Individual Therapy  Ramiyah Mcclenahan OTR/L 03/02/2019, 12:23 PM

## 2019-03-02 NOTE — Patient Care Conference (Signed)
Inpatient RehabilitationTeam Conference and Plan of Care Update Date: 03/01/2019   Time: 11:15 AM    Patient Name: Carrie [name]      Medical Record Number: [id number]  Date of Birth: March 26, 1941 Sex: Female         Room/Bed: 4W23C/4W23C-01 Payor Info: Payor: [employer] park [employer] MEDICARE / Plan: [employer] MEDICARE / Product Type: *No Product type* /    Admitting Diagnosis: CVA 2 Team  Lt CVA; 24-15days  Admit Date/Time:  02/24/2019  2:33 PM Admission Comments: No comment available   Primary Diagnosis:  <principal problem not specified> Principal Problem: <principal problem not specified>  Patient Active Problem List   Diagnosis Date Noted  . 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 Embolic stroke ([place]) 62/95/2841  . Acute right MCA stroke ([place])   . Cerebrovascular accident (CVA) ([place])   . Sleep disturbance   . Acute on chronic anemia   . Subtherapeutic international normalized ratio (INR) 02/22/2019  . Dysphagia following cerebrovascular accident (CVA) 02/22/2019  . Malnutrition ([place]) 02/22/2019  . Encounter for intubation   . Acute respiratory failure ([place])   . 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 CHF exacerbation ([place]) 02/03/2019  . CHF (congestive heart failure) ([place]) 02/02/2019  . SOB (shortness of breath) 02/02/2019  . Diarrhea 03/31/2018  . Abnormal LFTs 03/31/2018  . Hemoptysis 03/30/2018  . Elevated troponin 03/30/2018  . CKD (chronic kidney disease), stage III ([place]) 03/30/2018  . HLD (hyperlipidemia) 03/30/2018  . Blurry vision 03/30/2018  . Status post coronary artery stent placement   . Chronic diastolic heart failure ([place])   . History of mitral valve replacement with mechanical valve   . Hypertensive heart disease with heart failure ([place])   . Pure hypercholesterolemia   . Coronary artery disease involving native coronary artery of native heart without angina pectoris 08/11/2016  . PAH  (pulmonary artery hypertension) ([place]) 05/05/2016  . Anemia   . AKI (acute kidney injury) ([place])   . Hyponatremia 03/12/2016  . Chest pain 03/11/2016  . NSTEMI (non-ST elevated myocardial infarction) ([place]) 03/11/2016  . 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 Thrombocytopenia ([place]) 12/03/2014  . SSS (sick sinus syndrome) ([place]) 07/24/2014  . Longstanding persistent atrial fibrillation 07/24/2014  . Pacemaker 07/24/2014  . Tachycardia, a fib with RVR  04/03/2013  . Essential hypertension 04/03/2013  . SAH (subarachnoid hemorrhage), December 2012 03/21/2012  . Hemorrhage, hepatic, Rx'd with Coumadin reversal and embolisation 03/17/12 03/17/2012  . 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 Chronic anticoagulation, ( INR goal 2.0-2.5 due to history of subdural hematoma and liver hemorrhage) 09/09/2011  . Muscle weakness of lower extremity 09/08/2011  . Pacemaker - single chamber Medtronic Adapta, 2008 09/08/2011  . Atrial fibrillation ([place]) 09/06/2011  . S/P mitral valve replacement, St Jude 09/06/2011  . CONSTIPATION 02/12/2009    Expected Discharge Date: Expected Discharge Date: 03/08/19  Team Members Present: Physician leading conference: Dr. Alysia Penna Social Worker Present: Alfonse Alpers, LCSW Nurse Present: Isla Pence, RN PT Present: Michaelene Song, PT OT Present: Clyda Greener, OT SLP Present: Stormy Fabian, SLP PPS Coordinator present : Ileana Ladd, PT     Current Status/Progress Goal Weekly Team Focus  Medical   poor balance, slow processing speed  reduce fall risk ,   d/c planning   Bowel/Bladder   continent of bowel & bladder with episodes of urgency with leakage, LBM 02/28/19  less episodes of leakage  monitor & assist as needed   Swallow/Nutrition/ Hydration             ADL's   Pt currently needs supervision for UB selfcare with min assist for LB bathing and mod assist for LB dressing.  Functional transfers are at a min  assist level with use of the RW.  Pt moves very slowly with transfers and needs cueing for walker safety and for not reaching out to surfaces.    supervision to min assist level  selfcare retraining, balance retraining, neuromuscular re-education, therapeutic activities, pt/family educaiton, therapeutic exercise   Mobility   CGA-min assist for all mobility, gait with the RW up to 80 ft  supervision-CGA  balance, transfers, gait, endurance, family education   Communication             Safety/Cognition/ Behavioral Observations            Pain   c/o headache, has tylenol prn  pain scale <4/10  assess & treat as needed   Skin   admitted with DTI left heel  no acquired pressure injuries, signs of healing to left heel  assess q shift    Rehab Goals Patient on target to meet rehab goals: Yes Rehab Goals Revised: none *See Care Plan and progress notes for long and short-term goals.     Barriers to Discharge  Current Status/Progress Possible Resolutions Date Resolved   Physician    Medical stability     progressing with therapy program   cont rehab caregiver training      Nursing                  PT                    OT                  SLP                SW                Discharge Planning/Teaching Needs:  Pt to return home with her husband to provide 24/7 supervision and min A, as needed.  Husband will come for family education on 03-03-19.   Team Discussion:  Pt with right MCA stroke and subdural bleed who is doing well medically.  BUN and creatinine are slightly elevated and needs to drink more fluids.  Pt is continent of bowel with some urgency of bladder.  Pt has a 2nd negative stool.  Pt has a left heel pressure injury.  She takes tylenol for headaches and RN will encourage fluids because pt wants IV out.  OT is supervision for UB bathing and dressing and min A for LB bathing and mod A for LB dressing.  Pt moves slowly with transfers and turns.  She's min A with txs with RW  and needs more time to process.  Pt has mostly S level goals and some min A.  Pt is min A with RW for 80'.  She is slow, especially with turning and she needs cueing.  Pt can do 4 steps with min A.  ST evaluated pt and signed off.  Revisions to Treatment Plan:  none    Continued Need for Acute Rehabilitation Level of Care: The patient requires daily medical management by a physician with specialized training in physical medicine and rehabilitation for the following conditions: Daily direction of a multidisciplinary physical rehabilitation program to ensure safe treatment while  eliciting the highest outcome that is of practical value to the patient.: Yes Daily medical management of patient stability for increased activity during participation in an intensive rehabilitation regime.: Yes Daily analysis of laboratory values and/or radiology reports with any subsequent need for medication adjustment of medical intervention for : Neurological problems   I attest that I was present, lead the team conference, and concur with the assessment and plan of the team. Team conference was held via web/ teleconference due to [place] - 19.    Jamond Neels, Silvestre Mesi 03/02/2019, 10:09 AM

## 2019-03-02 NOTE — Progress Notes (Signed)
Physical Therapy Session Note  Patient Details  Name: Carrie [name] MRN: [id number] Date of Birth: 1940/12/23  Today's Date: 03/02/2019 PT Individual Time: 0900-1002 PT Individual Time Calculation (min): 62 min   Short Term Goals: Week 1:  PT Short Term Goal 1 (Week 1): Pt will perform supine<>sit with min assist PT Short Term Goal 2 (Week 1): Pt will perform bed<>chair transfers using LRAD with CGA PT Short Term Goal 3 (Week 1): Pt will ambulate at least 60ft using LRAD with CGA PT Short Term Goal 4 (Week 1): Pt will ascend/descend 2 steps using unilateral handrail or LRAD per home set-up with CGA  Skilled Therapeutic Interventions/Progress Updates:    Pt received supine in bed with MD exiting upon therapist arrival and pt agreeable to therapy session. Supine>sit, HOB slightly elevated and using bedrails, with supervision and increased time. Upon sitting on EOB pt has sudden onset of increased respiratory rate and shallow, quick breathing then pt states "I should have told the doctor about my breathing" - in <20 seconds breathing returns back to normal - pt states her HR increases and she has some anxiety when this occurs - RN notified and present to assess pt's HR. Sit<>stand EOB/BSC/w/c<>RW with CGA for steadying throughout session. Ambulated ~26ft to/from bathroom using RW with CGA for steadying - pt able to perform AD management with significantly increased time. Performed standing LB clothing management with B UE support on grabbar/RW with CGA for balance and max assist for clothing for time management. Continent of bladder. Performed standing balance tasks of hand hygiene and oral care standing at sink with RW and UE support on sink with CGA throughout for steadying - pt requires increased time to perform these tasks. Transported to/from gym in w/c. Ambulated 55ft using RW with CGA and demonstrating significantly decreased gait speed, decreased BLE step lengths, and decreased speed of movements  in general. Requires significantly increased time to turn and sit in w/c after ambulating with pt reporting her body wont move when she tells it to. Transported back to room in w/c and left sitting in w/c with needs in reach and seat belt alarm on.  Therapy Documentation Precautions:  Precautions Precautions: Fall Restrictions Weight Bearing Restrictions: No  Pain:   Upon sitting on EOB reports her bottom is sore and she has stiffness in her low back/hip region - RN notified and pt educated on sidelying for pressure relief as well as importance of moving more frequently during the day to decrease the stiffness.   Therapy/Group: Individual Therapy  Tawana Scale, PT, DPT 03/02/2019, 7:48 AM

## 2019-03-03 ENCOUNTER — Inpatient Hospital Stay (HOSPITAL_COMMUNITY): Payer: Medicare Other | Admitting: Physical Therapy

## 2019-03-03 ENCOUNTER — Encounter (HOSPITAL_COMMUNITY): Payer: Medicare Other | Admitting: Occupational Therapy

## 2019-03-03 ENCOUNTER — Inpatient Hospital Stay (HOSPITAL_COMMUNITY): Payer: Medicare Other | Admitting: Occupational Therapy

## 2019-03-03 ENCOUNTER — Ambulatory Visit (HOSPITAL_COMMUNITY): Payer: Medicare Other | Admitting: Physical Therapy

## 2019-03-03 LAB — PROTIME-INR
INR: 1.6 — ABNORMAL HIGH (ref 0.8–1.2)
Prothrombin Time: 19.2 seconds — ABNORMAL HIGH (ref 11.4–15.2)

## 2019-03-03 MED ORDER — WARFARIN SODIUM 7.5 MG PO TABS
7.5000 mg | ORAL_TABLET | Freq: Once | ORAL | Status: AC
  Administered 2019-03-03: 17:00:00 7.5 mg via ORAL
  Filled 2019-03-03: qty 1

## 2019-03-03 MED ORDER — WARFARIN SODIUM 5 MG PO TABS: 10.0000 mg | ORAL_TABLET | Freq: Once | ORAL | Status: DC

## 2019-03-03 NOTE — Plan of Care (Signed)
  Problem: RH Comprehension Communication Goal: LTG Patient will comprehend basic/complex auditory (SLP) Description: LTG: Patient will comprehend basic/complex auditory information with cues (SLP). Flowsheets (Taken 03/03/2019 1642) LTG: Patient will comprehend: Basic auditory information LTG: Patient will comprehend auditory information with cueing (SLP): Minimal Assistance - Patient > 75%   Problem: RH Problem Solving Goal: LTG Patient will demonstrate problem solving for (SLP) Description: LTG:  Patient will demonstrate problem solving for basic/complex daily situations with cues  (SLP) Flowsheets (Taken 03/03/2019 1642) LTG: Patient will demonstrate problem solving for (SLP): Basic daily situations LTG Patient will demonstrate problem solving for: Moderate Assistance - Patient 50 - 74%   Problem: RH Memory Goal: LTG Patient will use memory compensatory aids to (SLP) Description: LTG:  Patient will use memory compensatory aids to recall biographical/new, daily complex information with cues (SLP) Flowsheets (Taken 03/03/2019 1642) LTG: Patient will use memory compensatory aids to (SLP): Moderate Assistance - Patient 50 - 74%   Problem: RH Attention Goal: LTG Patient will demonstrate this level of attention during functional activites (SLP) Description: LTG:  Patient will will demonstrate this level of attention during functional activites (SLP) Flowsheets (Taken 03/03/2019 1642) Patient will demonstrate during cognitive/linguistic activities the attention type of: Sustained Patient will demonstrate this level of attention during cognitive/linguistic activities in: Controlled LTG: Patient will demonstrate this level of attention during cognitive/linguistic activities with assistance of (SLP): Moderate Assistance - Patient 50 - 74% Number of minutes patient will demonstrate attention during cognitive/linguistic activities: 15 minutes   Problem: RH Awareness Goal: LTG: Patient will  demonstrate awareness during functional activites type of (SLP) Description: LTG: Patient will demonstrate awareness during functional activites type of (SLP) Flowsheets (Taken 03/03/2019 1642) Patient will demonstrate during cognitive/linguistic activities awareness type of: Intellectual LTG: Patient will demonstrate awareness during cognitive/linguistic activities with assistance of (SLP): Moderate Assistance - Patient 50 - 74%

## 2019-03-03 NOTE — Progress Notes (Signed)
[facility] PHYSICAL MEDICINE & REHABILITATION PROGRESS NOTE   Subjective/Complaints:  Slept well , no HA or Nocturia   ROS- denies CP, SOB, N/V/D,  Objective:   No results found. No results for input(s): WBC, HGB, HCT, PLT in the last 72 hours. No results for input(s): NA, K, CL, CO2, GLUCOSE, BUN, CREATININE, CALCIUM in the last 72 hours.  Intake/Output Summary (Last 24 hours) at 03/03/2019 0855 Last data filed at 03/03/2019 0800 Gross per 24 hour  Intake 240 ml  Output -  Net 240 ml     Physical Exam: Vital Signs Blood pressure 110/68, pulse 68, temperature 99.2 F (37.3 C), temperature source Oral, resp. rate 17, height 5\' 1"  (1.549 m), weight 55.8 kg, SpO2 96 %.   General: No acute distress Mood and affect are appropriate Heart: Regular rate and rhythm no rubs murmurs or extra sounds Lungs: Clear to auscultation, breathing unlabored, no rales or wheezes Abdomen: Positive bowel sounds, soft nontender to palpation, nondistended Extremities: No clubbing, cyanosis, or edema Skin: No evidence of breakdown, no evidence of rash Neurologic: Cranial nerves II through XII intact, motor strength is 5/5 in bilateral deltoid, bicep, tricep, grip, hip flexor, knee extensors, ankle dorsiflexor and plantar flexor Sensory exam normal sensation to light touch and proprioception in bilateral upper and lower extremities Cerebellar exam normal finger to nose to finger as well as heel to shin in bilateral upper and lower extremities Musculoskeletal: Full range of motion in all 4 extremities. No joint swelling   Assessment/Plan: 1. Functional deficits secondary to Right post fossa SDH  which require 3+ hours per day of interdisciplinary therapy in a comprehensive inpatient rehab setting.  Physiatrist is providing close team supervision and 24 hour management of active medical problems listed below.  Physiatrist and rehab team continue to assess barriers to discharge/monitor patient  progress toward functional and medical goals  Care Tool:  Bathing    Body parts bathed by patient: Right arm, Left arm, Chest, Abdomen, Front perineal area, Buttocks, Right upper leg, Face, Left lower leg, Right lower leg, Left upper leg   Body parts bathed by helper: Right lower leg, Left lower leg     Bathing assist Assist Level: Minimal Assistance - Patient > 75%     Upper Body Dressing/Undressing Upper body dressing   What is the patient wearing?: Pull over shirt    Upper body assist Assist Level: Supervision/Verbal cueing    Lower Body Dressing/Undressing Lower body dressing      What is the patient wearing?: Pants     Lower body assist Assist for lower body dressing: Minimal Assistance - Patient > 75%     Toileting Toileting Toileting Activity did not occur Landscape architect and hygiene only): Refused  Toileting assist Assist for toileting: Minimal Assistance - Patient > 75%     Transfers Chair/bed transfer  Transfers assist     Chair/bed transfer assist level: Contact Guard/Touching assist     Locomotion Ambulation   Ambulation assist      Assist level: Contact Guard/Touching assist Assistive device: Walker-rolling Max distance: 90FT   Walk 10 feet activity   Assist     Assist level: Contact Guard/Touching assist Assistive device: Walker-rolling   Walk 50 feet activity   Assist    Assist level: Contact Guard/Touching assist Assistive device: Walker-rolling    Walk 150 feet activity   Assist Walk 150 feet activity did not occur: Safety/medical concerns         Walk 10 feet on  uneven surface  activity   Assist Walk 10 feet on uneven surfaces activity did not occur: Safety/medical concerns         Wheelchair     Assist Will patient use wheelchair at discharge?: (TBD )             Wheelchair 50 feet with 2 turns activity    Assist            Wheelchair 150 feet activity     Assist           Medical Problem List and Plan: 1.  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  St Jude's Mitral valve/A fib/ Antithrombotics: -DVT/anticoagulation:  Pharmaceutical: Coumadin              INR 1.6             -antiplatelet therapy: low dose ASA 3. Pain Management: tylenol prn.  Vascular HA - cont topiramate 25mg  BID- improved  4. Mood: LCSW to follow for evaluation and support.  Team to provide ego support.              -antipsychotic agents: NA 5. Neuropsych: This patient is not fully capable of making decisions on her own behalf. 6. Skin/Wound Care: routine pressure relief measures.  7. Fluids/Electrolytes/Nutrition: Monitor I/O. Intake improving.  8. CAD/Chronic diastolic [id number] heart disease with tricuspid regurg, MV replacement  Heart Healthy diet. SOB improving.  Strict I/O's. Continue lasix bid, spironolactone, lopressor with low dose ASA. Filed Weights   02/17/19 1100 02/24/19 0500  Weight: 59 kg 58.8 kg  9. Pneumonitis with hematemesis/Hypoxia: Continue IV protonix. Will monitor for now.              Wean supplemental oxygen as tolerated 10. Chronic A fib/ SSS s/p PPM: Monitor HR tid--continue amiodarone. On coumadin.             Monitor with increased mobility 11. Acute on chronic anemia?:  Check iron panel.  Follow with serial checks. Transfuse if Hgb < 7.0.  Add iron supplement. Had hemoptysis but not ongoing,  stool guaic neg 6/6 Repeat CBC in am 6/15 12. CKD: Baseline SCr-1.3? Encourage fluid intake.  creat mildly elevated over baseline at 1.44, repeat BMET 6/15 13. Sleep disturbance:  Add trazodone prn.   14.  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

## 2019-03-03 NOTE — Progress Notes (Signed)
Occupational Therapy Weekly Progress Note  Patient Details  Name: Carrie [name] MRN: [id number] Date of Birth: 08/23/1941  Beginning of progress report period: February 27, 2019 End of progress report period: March 03, 2019  Today's Date: 03/03/2019 OT Individual Time: 5885-0277 OT Individual Time Calculation (min): 45 min    Pt is currently making great progress with OT at this time.  She is able to complete all UB selfcare at supervision level and LB selfcare sit to stand with min assist for bathing and  mod assist for dressing.  Toilet transfers and toileting tasks area also currently min assist level with shower tub transfers with mod assist using a tub bench.  She continues to move slower with mobility and transfers secondary to decreased motor planning, and needs mod instructional cueing to take larger steps.  Overall feel she is on target for established min assist to supervision with anticipation of discharge home 6/17.  Will continue with current OT POC until discharge.    Patient continues to demonstrate the following deficits: muscle weakness and decreased standing balance and decreased balance strategies and therefore will continue to benefit from skilled OT intervention to enhance overall performance with BADL.  Patient progressing toward long term goals..  Continue plan of care.  OT Short Term Goals Week 2:  OT Short Term Goal 1 (Week 2): Pt will continue to work on established LTGs set at supervision level  to min assist for discharge.  Skilled Therapeutic Interventions/Progress Updates:    Completed family education with pt's spouse on current level of selfcare and functional transfers.  Practiced tub/shower transfers with use of the tub bench for support and mod facilitation.  Pt's spouse reports that she was not moving as slow as she is now with transfers.  Discussed need for a hand held shower at discharge and they already have grab bars for support.  Spouse reports that at times,  he was having to assist her with some aspects of selfcare and dressing depending on her progress and feels comfortable providing assist at her current level.  Took pt back to the room at end of session with call button and phone in reach.    Therapy Documentation Precautions:  Precautions Precautions: Fall Restrictions Weight Bearing Restrictions: No  Pain: Pain Assessment Pain Scale: Faces Pain Score: 0-No pain ADL: See Care Tool Section for some details of ADL  Therapy/Group: Individual Therapy  Carrie [name] except 03/03/2019, 3:51 PM

## 2019-03-03 NOTE — Evaluation (Addendum)
Speech Language Pathology Assessment and Plan  Patient Details  Name: Carrie [name] MRN: [id number] Date of Birth: May 28, 1941  SLP Diagnosis: Cognitive Impairments  Rehab Potential: Fair ELOS: ELOS 6/17    Today's Date: 03/03/2019 SLP Individual Time: 1438-1500 SLP Individual Time Calculation (min): 22 min   Problem List:  Patient Active Problem List   Diagnosis Date Noted  . 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 Embolic stroke ([place]) 19/62/2297  . Acute right MCA stroke ([place])   . Cerebrovascular accident (CVA) ([place])   . Sleep disturbance   . Acute on chronic anemia   . Subtherapeutic international normalized ratio (INR) 02/22/2019  . Dysphagia following cerebrovascular accident (CVA) 02/22/2019  . Malnutrition ([place]) 02/22/2019  . Encounter for intubation   . Acute respiratory failure ([place])   . 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 CHF exacerbation ([place]) 02/03/2019  . CHF (congestive heart failure) ([place]) 02/02/2019  . SOB (shortness of breath) 02/02/2019  . Diarrhea 03/31/2018  . Abnormal LFTs 03/31/2018  . Hemoptysis 03/30/2018  . Elevated troponin 03/30/2018  . CKD (chronic kidney disease), stage III ([place]) 03/30/2018  . HLD (hyperlipidemia) 03/30/2018  . Blurry vision 03/30/2018  . Status post coronary artery stent placement   . Chronic diastolic heart failure ([place])   . History of mitral valve replacement with mechanical valve   . Hypertensive heart disease with heart failure ([place])   . Pure hypercholesterolemia   . Coronary artery disease involving native coronary artery of native heart without angina pectoris 08/11/2016  . PAH (pulmonary artery hypertension) ([place]) 05/05/2016  . Anemia   . AKI (acute kidney injury) ([place])   . Hyponatremia 03/12/2016  . Chest pain 03/11/2016  . NSTEMI (non-ST elevated myocardial infarction) ([place]) 03/11/2016  . Moderate to severe tricuspid regurgitation  11/15/2015  . Thrombocytopenia ([place]) 12/03/2014  . SSS (sick sinus syndrome) ([place]) 07/24/2014  . Longstanding persistent atrial fibrillation 07/24/2014  . Pacemaker 07/24/2014  . Tachycardia, a fib with RVR  04/03/2013  . Essential hypertension 04/03/2013  . SAH (subarachnoid hemorrhage), December 2012 03/21/2012  . Hemorrhage, hepatic, Rx'd with Coumadin reversal and embolisation 03/17/12 03/17/2012  . 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 Chronic anticoagulation, ( INR goal 2.0-2.5 due to history of subdural hematoma and liver hemorrhage) 09/09/2011  . Muscle weakness of lower extremity 09/08/2011  . Pacemaker - single chamber Medtronic Adapta, 2008 09/08/2011  . Atrial fibrillation ([place]) 09/06/2011  . S/P mitral valve replacement, St Jude 09/06/2011  . CONSTIPATION 02/12/2009   Past Medical History:  Past Medical History:  Diagnosis Date  . Anticoagulated on Coumadin    for mech valve and atrial fib  goal 2.0-2.5  . Anxiety   . Arthritis    "right shoulder" (03/15/2013)  . Atrial fibrillation ([place])   . Atrial flutter ([place])   . CAD (coronary artery disease) 08/11/2016  . Chronic combined systolic and diastolic CHF (congestive heart failure) ([place])    a. 02/2016 Echo: EF 45-50%.  . Diverticula of colon   . GERD (gastroesophageal reflux disease)   . Hemorrhage intraabdominal 03/17/2012  . Hyperlipidemia   . Hypertension   . Liver hemorrhage   . Migraines   . 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 Bi-leaflet St. Jude mechanical prosthesis; b. 02/2016 Echo: EF 45-50%, some degree of MR, sev dil LA/RA, sev TR.  . NSTEMI (non-ST elevated myocardial infarction) ([place])    a. 02/2016  elev trop/Cath: nonobs dzs,   . Pacemaker    medtronic adapta  . Severe tricuspid regurgitation    a. 02/2016 Echo: Ef 45-50%, sev TR, PASP 2mHg.  . Sick sinus syndrome ([place]    Dr GCristopher Peru EP study negative. Pacemaker 12/15/06 Medtronic  . Stroke ([facility]    "they  say I had a stroke last year" denies residual on 03/15/2013  . Subdural hematoma ([facility]    Past Surgical History:  Past Surgical History:  Procedure Laterality Date  . APPENDECTOMY    . CARDIAC CATHETERIZATION  04/02/97   R&L:severe MR/pulmonary hypertension  . CARDIAC CATHETERIZATION N/A 03/16/2016   Procedure: Left Heart Cath and Coronary Angiography;  Surgeon: JJettie Booze MD;  [facility];  Service: Cardiovascular;  Laterality: N/A;  . CARDIAC CATHETERIZATION N/A 09/30/2016   Procedure: Left Heart Cath and Coronary Angiography;  Surgeon: MWellington Hampshire MD;  [facility];  Service: Cardiovascular;  Laterality: N/A;  . CARDIAC CATHETERIZATION N/A 09/30/2016   Procedure: Coronary Stent Intervention;  Surgeon: MWellington Hampshire MD;  3.5 x 12 mm resolute Onyx  to ostial RCA  . CATARACT EXTRACTION W/ INTRAOCULAR LENS  IMPLANT, BILATERAL Bilateral 2013  . CORONARY ANGIOPLASTY    . DILATION AND CURETTAGE OF UTERUS     "had fibroids" (03/15/2013)  . EXPLORATORY LAPAROTOMY     "had a growth on my intestines" (03/15/2013)  . HAMMER TOE SURGERY Right   . INSERT / REPLACE / REMOVE PACEMAKER  12/15/2006   Medtronic  . IR ANGIO VERTEBRAL SEL SUBCLAVIAN INNOMINATE UNI R MOD SED  02/17/2019  . IR CT HEAD LTD  02/17/2019  . IR PERCUTANEOUS ART THROMBECTOMY/INFUSION INTRACRANIAL INC DIAG ANGIO  02/17/2019  . MITRAL VALVE REPLACEMENT  1998   St Jude mechanical; Dr. GServando Snare . PACEMAKER PLACEMENT  12/15/06   medtronic adapta for SSS  . PERMANENT PACEMAKER GENERATOR CHANGE N/A 07/24/2014   Procedure: PERMANENT PACEMAKER GENERATOR CHANGE;  Surgeon: MSanda Klein MD;  [facility];  Service: Cardiovascular;  Laterality: N/A;  . PERSANTINE CARDIOLITE  08/07/03   mild inf. ischemia   . RADIOLOGY WITH ANESTHESIA N/A 02/17/2019   Procedure: IR WITH ANESTHESIA;  Surgeon: DLuanne Bras MD;  [facility]  Service: Radiology;  Laterality: N/A;  . RIGHT  HEART CATH N/A 02/06/2019   Procedure: RIGHT HEART CATH;  Surgeon: BJolaine Artist MD;  [facility];  Service: Cardiovascular;  Laterality: N/A;  . TEE WITHOUT CARDIOVERSION  09/23/2011   Procedure: TRANSESOPHAGEAL ECHOCARDIOGRAM (TEE);  Surgeon: KPixie Casino  [facility];  Service: Cardiovascular;  Laterality: N/A;  . TONSILLECTOMY    . TUBAL LIGATION    . [place]OCARDIOGRAPHY  11/19/2011   EF 50-55%,RA mod to severely dilated,LA severely dilated,trace MR,small vegetation or mass on the MV,AOV mildly scleroticmild PI, RV pressure 40-542mg    Assessment / Plan / Recommendation Clinical Impression  Pt's husband has not been present d/t COVID restrictions. Although this wrProbation officerpoke with pt's husband who stated that he provided for all of pt's needs and performed 50% aid when pt had to perform ADLs prior to this admission. Pt present for caregiver education today with PT and OT. He was concerned with pt's overall "slowed ability." While he is able to provide for all of her needs (as he did before) he would like cognition evaluation. During face to face conversation with pt and her husband, he is inconsistent with his  descriptions and concerns. However, pt does present with delayed response times, recall of activities throughout the day, decreased task initiation, sustained attention, repetition of directions for understanding and possibility of decreased safety awareness. To create a safer discharge plan, recommend initiating skilled ST to target the above mentioned areas.    Skilled Therapeutic Interventions          Skilled treatment session focused on attempting to determine baseline function, completing education with pt's husband and answering all questions to satisfaction.    SLP Assessment  Patient will need skilled [facility] during CIR admission    Recommendations  Patient destination: Home Follow up Recommendations: Home Health  SLP;24 hour supervision/assistance Equipment Recommended: None recommended by SLP    SLP Frequency 3 to 5 out of 7 days   SLP Duration  SLP Intensity  SLP Treatment/Interventions ELOS 6/17  Minumum of 1-2 x/day, 30 to 90 minutes  Cognitive remediation/compensation;Patient/family education    Pain Pain Assessment Pain Scale: Faces Pain Score: 0-No pain  Prior Functioning Cognitive/Linguistic Baseline: Within functional limits Type of Home: House  Lives With: Spouse Available Help at Discharge: Family;Available 24 hours/day Vocation: Retired  Industrial/product designer Term Goals: Week 1: SLP Short Term Goal 1 (Week 1): Pt will initiate basic familiar task in 7 out of 10 opportunities with Mod A cues. SLP Short Term Goal 2 (Week 1): Pt will demonstrate general safety awareness by answering [facility] questions with 75% accuracy and Mod A cues. SLP Short Term Goal 3 (Week 1): Pt will demonstrate sustained attention to basic familiar task for 10 minutes with Mod A cues. SLP Short Term Goal 4 (Week 1): Pt will follow 2 step simple direction in 7 out of 10 opportunties with Mod A cues. SLP Short Term Goal 5 (Week 1): Pt will utilize external memory aid to recall information with Mod A cues.  Refer to Care Plan for Long Term Goals  Recommendations for other services: None   Discharge Criteria: Patient will be discharged from SLP if patient refuses treatment 3 consecutive times without medical reason, if treatment goals not met, if there is a change in medical status, if patient makes no progress towards goals or if patient is discharged from hospital.  The above assessment, treatment plan, treatment alternatives and goals were discussed and mutually agreed upon: by patient and by family  Dreonna Hussein 03/03/2019, 4:46 PM

## 2019-03-03 NOTE — Progress Notes (Signed)
Physical Therapy Session Note  Patient Details  Name: Carrie [name] MRN: [id number] Date of Birth: Feb 28, 1941  Today's Date: 03/03/2019 PT Individual Time: 1107-1205 and 1349-1436 PT Individual Time Calculation (min): 58 min and 47 min  Short Term Goals: Week 1:  PT Short Term Goal 1 (Week 1): Pt will perform supine<>sit with min assist PT Short Term Goal 2 (Week 1): Pt will perform bed<>chair transfers using LRAD with CGA PT Short Term Goal 3 (Week 1): Pt will ambulate at least 94ft using LRAD with CGA PT Short Term Goal 4 (Week 1): Pt will ascend/descend 2 steps using unilateral handrail or LRAD per home set-up with CGA  Skilled Therapeutic Interventions/Progress Updates:    Session 1: Pt received sitting in w/c with nursing staff present for medication administration and pt agreeable to therapy session. Therapist donned B shoes max assist for time management. Transported to/from gym in w/c. Performed sit<>stand w/c<>RW with CGA for steadying throughout session. Patient continues to demonstrate significantly delayed processing with motor planning impairments and significantly decreased speed of movements (especially when turning). Reports R headache pain of 7/10, denies visual changes, and pt repeatedly states "It'll go away" and deferred intervention. Ambulated ~63ft using RW with min assist for balance and AD management especially when turning with sequential cuing for stepping and AD management - manual facilitation for lateral weightshifting during ambulation to decrease freezing of movement. Approximately 1 minute after walking while taking a seated rest break pt starts coughing then wheezing and has difficulty catching her breath - therapist provided cuing for breathing and emotional support and pt's breathing returned to normal in <30 seconds. Pt reports headache pain is gone. Ascended/descended 2 steps using L HHA to replicate home environment (no rails) with mod assist for lifting and  balance then performed next 2 steps with L handrail and R HHA to replicate indoor stairs at home with min/mod assist for lifting/balance - descended 4 steps using B UE support on L handrail and step-to pattern with min/mod assist for balance on descent and assist for placement of leading L LE to allow room for other foot on step. Ascended/descended 4 steps using L handrail and R HHA on ascent and B UE support on handrail descending with step-to pattern and mod assist for lifting/lowering/balance throughout as well as for L LE placement on step during descent to allow room for other foot. Pt states "My head is throbbing." Transported back to room in w/c and pt reports needing to use bathroom. Pt left sitting in w/c with seat belt alarm on and nursing staff notified of headache pain and pt's need for assist to bathroom.   Session 2: Pt received sitting in wheelchair with her husband, Jeneen Rinks, present for family education and OT exiting upon PT arrival. Pt/family agreeable to therapy session. Pt's husband reports that he has had 2 chair lifts installed in the home to get up to/down from the bedroom/bathroom on 2nd floor. Pt's husband reports there are 2STE home without handrails and therapist educated him on the need for bilateral handrails. Pt transported to/from gym in w/c. Pt/family educated on proper car transfer technique with visual demonstration provided with verbal education. Pt performed ambulatory car transfer using RW with min assist from therapist for balance and B LE management in/out of vehicle. Pt/husband educated on need for bilateral handrails on outdoor stairs to enter home. Pt ascended/descended 4 steps using bilateral handrails with step-to pattern and min assist for balance throughout. Pt's husband reports that patient is moving  approximately 50% slower than she did prior to hospitalization and reports that he has noted pt's lack of verbal response to his questions/statements - Speech Therapist  notified of husband's noticed differences in the patient's communication. Pt transported back to room in w/c and reports needing to use bathroom. Stand pivot w/c>BSC over toilet using grab bars with min assist for balance and mod assist for LB clothing management due to urgency. Stand>sit with min assist. Pt left sitting on BSC over toilet with nursing staff present to assist pt further.   Therapy Documentation Precautions:  Precautions Precautions: Fall Restrictions Weight Bearing Restrictions: No  Pain:   Session 1: Reports right headache pain level of 7/10 at beginning of session that decreased after walking and then increased after stair navigation - RN notified.  Session 2: No reports of pain during session.   Therapy/Group: Individual Therapy  Tawana Scale, PT, DPT 03/03/2019, 8:00 AM

## 2019-03-03 NOTE — Progress Notes (Addendum)
ANTICOAGULATION CONSULT NOTE   Pharmacy Consult for Warfarin Indication: atrial fibrillation and mechanical valve  Allergies  Allergen Reactions  . Codeine Nausea And Vomiting    Patient Measurements: Height: 5\' 1"  (154.9 cm) Weight: 123 lb 0.3 oz (55.8 kg) IBW/kg (Calculated) : 47.8  Vital Signs: Temp: 98.1 F (36.7 C) (06/12 1446) Temp Source: Oral (06/12 1446) BP: 115/71 (06/12 1446) Pulse Rate: 65 (06/12 1446)  Labs: Recent Labs    03/01/19 0643 03/02/19 0605 03/03/19 0618  LABPROT 18.2* 18.5* 19.2*  INR 1.5* 1.6* 1.6*    Estimated Creatinine Clearance: 24.7 mL/min (A) (by C-G formula based on SCr of 1.44 mg/dL (H)).  Assessment: 68 yof s/p MCA M1 occlusion s/p IR revascularization. She is on chronic warfarin for afib and mechanical valve. INR on admission was subtherapeutic at 1.9 so she was initiated on IV heparin as a bridge back to therapeutic warfarin. Unfortunately she developed an SDH and SAH. Heparin was reversed and warfarin held. Warfarin was resumed 6/5 without bridging and lower INR goal of 2-2.5.   INR is increasing slowly however remains SUBtherapeutic (INR 1.6 << 1.5, goal of 2-2.5). CBC on 6/8 reveals stable Hgb of 8.5, Pltc WNL.   Goal of Therapy:  INR 2-2.5 Monitor platelets by anticoagulation protocol: Yes   Plan:  - Repeat Warfarin 7.5 mg PO x 1 - Daily INR *Anticipate discharge warfarin regimen will need to be adjusted to accommodate new INR goal  Thank you for allowing pharmacy to be a part of this patient's care.  Lindell Spar, PharmD, BCPS Clinical Pharmacist 03/03/2019 3:42 PM   **Pharmacist phone directory can now be found on [email or web address] ([email or web address]).  Listed under [place].

## 2019-03-03 NOTE — Progress Notes (Signed)
Occupational Therapy Session Note  Patient Details  Name: Carrie [name] MRN: [id number] Date of Birth: 12/27/1940  Today's Date: 03/03/2019 OT Individual Time: 2671-2458 OT Individual Time Calculation (min): 46 min    Short Term Goals: Week 1:  OT Short Term Goal 1 (Week 1): STGs=LTGs due to ELOS  Skilled Therapeutic Interventions/Progress Updates:    Pt completed transfer from supine to sit EOB with min assist and for transfer to the wheelchair.  She was able to complete bathing sit to stand at the sink.  Supervision for UB bathing and min guard for washing peri area and buttocks.  She did not attempt washing other parts of her body secondary to time and taking a "good" bath yesterday.  She was able to complete UB dressing with supervision with therapist completing the threading of her brief and pants with max assist secondary to increased time.  Min assist for pulling pants over hips.  She also completed oral hygiene with supervision from seated position.  Finished session with pt in the wheelchair and call button and phone in reach.  Safety belt alarm in place as well.    Therapy Documentation Precautions:  Precautions Precautions: Fall Restrictions Weight Bearing Restrictions: No   Pain: Pain Assessment Pain Scale: 0-10 Pain Score: 0-No pain ADL: See Care Tool Section for some details of ADL  Therapy/Group: Individual Therapy  Mirinda Monte OTR/L 03/03/2019, 12:09 PM

## 2019-03-04 ENCOUNTER — Inpatient Hospital Stay (HOSPITAL_COMMUNITY): Payer: Medicare Other

## 2019-03-04 ENCOUNTER — Inpatient Hospital Stay (HOSPITAL_COMMUNITY): Payer: Medicare Other | Admitting: Physical Therapy

## 2019-03-04 DIAGNOSIS — I2510 Atherosclerotic heart disease of native coronary artery without angina pectoris: Secondary | ICD-10-CM

## 2019-03-04 DIAGNOSIS — I5032 Chronic diastolic (congestive) heart failure: Secondary | ICD-10-CM

## 2019-03-04 LAB — PROTIME-INR
INR: 1.8 — ABNORMAL HIGH (ref 0.8–1.2)
Prothrombin Time: 21 seconds — ABNORMAL HIGH (ref 11.4–15.2)

## 2019-03-04 MED ORDER — WARFARIN SODIUM 7.5 MG PO TABS
7.5000 mg | ORAL_TABLET | Freq: Once | ORAL | Status: AC
  Administered 2019-03-04: 18:00:00 7.5 mg via ORAL
  Filled 2019-03-04: qty 1

## 2019-03-04 NOTE — Progress Notes (Signed)
Speech Language Pathology Daily Session Note  Patient Details  Name: Carrie [name] MRN: [id number] Date of Birth: [birth year]  Today's Date: 03/04/2019 SLP Individual Time: 1110-1201 SLP Individual Time Calculation (min): 51 min  Short Term Goals: Week 1: SLP Short Term Goal 1 (Week 1): Pt will initiate basic familiar task in 7 out of 10 opportunities with Mod A cues. SLP Short Term Goal 2 (Week 1): Pt will demonstrate general safety awareness by answering [facility] questions with 75% accuracy and Mod A cues. SLP Short Term Goal 3 (Week 1): Pt will demonstrate sustained attention to basic familiar task for 10 minutes with Mod A cues. SLP Short Term Goal 4 (Week 1): Pt will follow 2 step simple direction in 7 out of 10 opportunties with Mod A cues. SLP Short Term Goal 5 (Week 1): Pt will utilize external memory aid to recall information with Mod A cues.  Skilled Therapeutic Interventions: Skilled ST services focused on cognitive skills. SLP facilitated safety awareness given simple [place] pertaining to ambulation at home an din current facility, pt answered 4 out 4 with supervision A verbal cues. SLP facilitated task initiation and sustained attention in card sorting task by color and then by shape in fields of 6, pt required supervision A verbal cues for initiation and sustained attention for 10 minute interval. Although, when SLP increased complexity of task, car task played at simplest level ([place]), pt required mod A verbal cues for initiation, sustained attention and min A verbal cues for recall given visual aid for three rules. Pt was left in room with call bell within reach and chair alarm set. ST recommends to continue skilled ST services.      Pain Pain Assessment Pain Score: 0-No pain  Therapy/Group: Individual Therapy  Domique Reardon  [facility] 03/04/2019, 12:04 PM

## 2019-03-04 NOTE — Progress Notes (Signed)
[place] PHYSICAL MEDICINE & REHABILITATION PROGRESS NOTE   Subjective/Complaints:  Patient is sitting up in bed eating.  She has no specific complaints.  She did sleep well last night.  She denies any chest pain, nausea or vomiting.  She does admit to chronic, intermittent shortness of breath related to her "heart problems".  Objective:   Physical Exam: Vital Signs Blood pressure 111/64, pulse 74, temperature 99.3 F (37.4 C), temperature source Oral, resp. rate 20, height 5\' 1"  (1.549 m), weight 53 kg, SpO2 95 %.  Thin, elderly female in no acute distress.  HEENT exam atraumatic, normocephalic, extraocular muscles are intact.  Neck is supple without lymphadenopathy or thyromegaly.  There is some jugular venous distention.  Chest is clear to auscultation without any increased work of breathing.  There are no crackles or wheezes. Cardiac exam S1 and S2 are irregular.  On palpation she has what appears to be a pacemaker.  There is a left ventricular heave.  Hyperdynamic precordium.  There is a slight thrill over the apex.  There is a 4/6 holosystolic murmur at the apex. Extremities without edema Neurologic exam she is alert.   Assessment/Plan: 1. Functional deficits secondary to Right post fossa SDH   Medical Problem List and Plan: 1.  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  2.  St Jude's Mitral valve/A fib/ Antithrombotics: -DVT/anticoagulation:  Pharmaceutical: Coumadin        Lab Results  Component Value Date   INR 1.6 (H) 03/03/2019   INR 1.6 (H) 03/02/2019   INR 1.5 (H) 03/01/2019                -antiplatelet therapy: low dose ASA 3. Pain Management: tylenol prn.  Vascular HA - cont topiramate 25mg  BID- improved  4. Mood: LCSW to follow for evaluation and support.  Team to provide ego support.              -antipsychotic agents: NA 5.  Neuropsych: This patient is not fully capable of making decisions on her own behalf. 6. Skin/Wound Care: routine pressure relief measures.  7. Fluids/Electrolytes/Nutrition: Monitor I/O. Intake improving.  8. CAD/Chronic diastolic UMP:NTIRWERX heart disease  Currently stable. Filed Weights   02/17/19 1100 02/24/19 0500  Weight: 59 kg 58.8 kg  9. Pneumonitis with hematemesis/Hypoxia: Continue IV protonix. Will monitor for now.              Wean supplemental oxygen as tolerated 10. Chronic A fib/ SSS s/p PPM: Monitor HR tid--continue amiodarone. On coumadin.             Monitor with increased mobility 11. Acute on chronic anemia?:  Check iron panel.  Follow with serial checks. Transfuse if Hgb < 7.0.  Add iron supplement. Had hemoptysis but not ongoing,  stool guaic neg 6/6 Repeat CBC in am 6/15 12. CKD: Baseline SCr-1.3? Encourage fluid intake.  creat mildly elevated over baseline at 1.44, repeat BMET 6/15 13. Sleep disturbance:  Add trazodone prn.   14.  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

## 2019-03-04 NOTE — Progress Notes (Signed)
Physical Therapy Weekly Progress Note  Patient Details  Name: Carrie [name] MRN: [id number] Date of Birth: 12-14-1940  Beginning of progress report period: February 25, 2019 End of progress report period: March 04, 2019  Today's Date: 03/04/2019 PT Individual Time: 1351-1511 PT Individual Time Calculation (min): 80 min   Patient has met 3 of 4 short term goals.  Patient progressing well with therapy and demonstrates improvements in functional mobility since evaluation; however, continues to demonstrate delayed processing and initiation of movements in addition to significantly decreased speed of movement. Patient demonstrates ability to perform transfers and ambulate 140f using RW with CGA.  Patient continues to demonstrate the following deficits muscle weakness, decreased cardiorespiratoy endurance, impaired timing and sequencing and decreased motor planning,  , decreased initiation, decreased attention, decreased memory and delayed processing and decreased standing balance and decreased balance strategies and therefore will continue to benefit from skilled PT intervention to increase functional independence with mobility.  Patient progressing toward long term goals..  Continue plan of care.  PT Short Term Goals Week 1:  PT Short Term Goal 1 (Week 1): Pt will perform supine<>sit with min assist PT Short Term Goal 1 - Progress (Week 1): Met PT Short Term Goal 2 (Week 1): Pt will perform bed<>chair transfers using LRAD with CGA PT Short Term Goal 2 - Progress (Week 1): Met PT Short Term Goal 3 (Week 1): Pt will ambulate at least 537fusing LRAD with CGA PT Short Term Goal 3 - Progress (Week 1): Met PT Short Term Goal 4 (Week 1): Pt will ascend/descend 2 steps using unilateral handrail or LRAD per home set-up with CGA PT Short Term Goal 4 - Progress (Week 1): Progressing toward goal(used B HRs as pt's husband planning on having them installed prior to pt's D/C) Week 2:  PT Short Term Goal 1 (Week  2): = to LTGs based on ELOS  Skilled Therapeutic Interventions/Progress Updates:  Ambulation/gait training;Cognitive remediation/compensation;Discharge planning;DME/adaptive equipment instruction;Functional mobility training;Pain management;Psychosocial support;Splinting/orthotics;Therapeutic Activities;UE/LE Strength taining/ROM;Visual/perceptual remediation/compensation;Balance/vestibular training;Community reintegration;Disease management/prevention;Functional electrical stimulation;Neuromuscular re-education;Patient/family education;Skin care/wound management;Stair training;Therapeutic Exercise;UE/LE Coordination activities;Wheelchair propulsion/positioning   Pt received sitting in w/c and agreeable to therapy session.  Transported to/from gym in w/c. Ambulated 10020fsing RW with CGA for steadying/safety and multimodal cuing throughout for sequencing of stepping and AD management - pt continues to demonstrate significantly decreased gait speed; however, improved from initial evaluation. Ascended/descended 4 (6" height) steps using bilateral handrails (as plan is for pt's husband to have B HRs installed at home) with reciprocal pattern and min assist for balance/steadying primarily during descent. Stand pivot transfer w/c>EOM using RW with CGA. Performed alternate BLE heel taps on 4" step with B UE support on RW and CGA for steadying - pt demonstrates impaired L LE motor initiation and decreased speed of movement compared to R LE - therefore performed repeated L LE step up/down to create motor plan and increase speed of movement with pt demonstrating improvement then transitioned back to alternate B LE while sustaining the improved LE initiation/speed. Performed forward/backwards stepping over line on floor using RW focusing on larger step length with CGA for steadying. Ambulated [id number] L HHA and min assist for balance with pt demonstrating increased speed of movement but also increased postural sway  and unsteadiness compared to when ambulating with RW. Transported back to room in w/c. Stand pivot transfer w/c<>BSC over toilet using grab bars with min assist due to urgency. Performed LB clothing management with mod assist due to urgency, continent  bladder, and peri-care with set-up assist. Seated hand hygiene in w/c at sink. Pt left sitting in w/c with needs in reach and seat belt alarm on.   Therapy Documentation Precautions:  Precautions Precautions: Fall Restrictions Weight Bearing Restrictions: No  Pain: Denies pain during therapy session.  Therapy/Group: Individual Therapy  Tawana Scale, PT, DPT 03/04/2019, 1:02 PM

## 2019-03-04 NOTE — Progress Notes (Signed)
ANTICOAGULATION CONSULT NOTE   Pharmacy Consult for Warfarin Indication: atrial fibrillation and mechanical valve  Allergies  Allergen Reactions  . Codeine Nausea And Vomiting    Patient Measurements: Height: 5\' 1"  (154.9 cm) Weight: 116 lb 13.5 oz (53 kg) IBW/kg (Calculated) : 47.8  Vital Signs: Temp: 98.5 F (36.9 C) (06/13 1332) Temp Source: Oral (06/13 1332) BP: 97/51 (06/13 1332) Pulse Rate: 79 (06/13 1332)  Labs: Recent Labs    03/02/19 0605 03/03/19 0618 03/04/19 1256  LABPROT 18.5* 19.2* 21.0*  INR 1.6* 1.6* 1.8*    Estimated Creatinine Clearance: 24.7 mL/min (A) (by C-G formula based on SCr of 1.44 mg/dL (H)).  Assessment: 60 yof s/p MCA M1 occlusion s/p IR revascularization. She is on chronic warfarin for afib and mechanical valve. INR on admission was subtherapeutic at 1.9 so she was initiated on IV heparin as a bridge back to therapeutic warfarin. Unfortunately she developed an SDH and SAH. Heparin was reversed and warfarin held. Warfarin was resumed 6/5 without bridging and lower INR goal of 2-2.5.   INR remains subtherapeutic but is increasing slowly (INR 1.8 << 1.6 << 1.5, goal of 2-2.5). CBC on 6/8 reveals stable Hgb of 8.5, Pltc WNL.   Goal of Therapy:  INR 2-2.5 Monitor platelets by anticoagulation protocol: Yes   Plan:  - Repeat Warfarin 7.5 mg PO x 1 - Daily INR *Anticipate discharge warfarin regimen will need to be adjusted to accommodate new INR goal  Thank you for allowing pharmacy to be a part of this patient's care.  Lindell Spar, PharmD, BCPS Clinical Pharmacist 03/04/2019 2:18 PM   **Pharmacist phone directory can now be found on [email or web address] ([email or web address]).  Listed under [place].

## 2019-03-05 DIAGNOSIS — R531 Weakness: Secondary | ICD-10-CM

## 2019-03-05 DIAGNOSIS — R269 Unspecified abnormalities of gait and mobility: Secondary | ICD-10-CM

## 2019-03-05 DIAGNOSIS — I69398 Other sequelae of cerebral infarction: Secondary | ICD-10-CM

## 2019-03-05 LAB — PROTIME-INR
INR: 2.2 — ABNORMAL HIGH (ref 0.8–1.2)
Prothrombin Time: 24.1 seconds — ABNORMAL HIGH (ref 11.4–15.2)

## 2019-03-05 MED ORDER — WARFARIN SODIUM 7.5 MG PO TABS
7.5000 mg | ORAL_TABLET | Freq: Once | ORAL | Status: AC
  Administered 2019-03-05: 17:00:00 7.5 mg via ORAL
  Filled 2019-03-05: qty 1

## 2019-03-05 NOTE — Progress Notes (Signed)
ANTICOAGULATION CONSULT NOTE   Pharmacy Consult for Warfarin Indication: atrial fibrillation and mechanical valve  Allergies  Allergen Reactions  . Codeine Nausea And Vomiting    Patient Measurements: Height: 5\' 1"  (154.9 cm) Weight: 122 lb 9.2 oz (55.6 kg) IBW/kg (Calculated) : 47.8  Vital Signs: Temp: 98.5 F (36.9 C) (06/14 0326) Temp Source: Oral (06/14 0326) BP: 114/60 (06/14 0758) Pulse Rate: 74 (06/14 0758)  Labs: Recent Labs    03/03/19 0618 03/04/19 1256  LABPROT 19.2* 21.0*  INR 1.6* 1.8*    Estimated Creatinine Clearance: 24.7 mL/min (A) (by C-G formula based on SCr of 1.44 mg/dL (H)).  Assessment: 3 yof s/p MCA M1 occlusion s/p IR revascularization. She is on chronic warfarin for afib and mechanical MVR. INR on admission was subtherapeutic at 1.9 so she was initiated on IV heparin as a bridge back to therapeutic warfarin. Unfortunately she developed an SDH and SAH. Heparin was reversed and warfarin held. Warfarin was resumed 6/5 without bridging and lower INR goal of 2-2.5.   Today, 03/05/2019 INR therapeutic at 2.2, first therapeutic level since Warfarin resumed 6/5 (INR 1.6 >> 1.8 (6/13)) Po intake adequate No s/s bleed. Last CBC 6/8, Plt wnl  Goal of Therapy:  INR 2-2.5 Monitor platelets by anticoagulation protocol: Yes   Plan:  - Warfarin 7.5mg  today at 1800 (hesitate to reduce dose with therapeutic INR) - Daily INR *Anticipate discharge warfarin regimen will need to be adjusted to accommodate new INR goal   Thank you for allowing pharmacy to be a part of this patient's care.  Minda Ditto PharmD [phone] Clinical Pharmacist 03/05/2019 10:45 AM   **Pharmacist phone directory can now be found on [email or web address] ([email or web address]).  Listed under [place].

## 2019-03-05 NOTE — Progress Notes (Signed)
[place] PHYSICAL MEDICINE & REHABILITATION PROGRESS NOTE   Subjective/Complaints: Patient feels well this morning.  She sitting up eating.  She slept well.  She denies any pain.  She states her shortness of breath is somewhat improved.  Objective:   Physical Exam: Vital Signs Blood pressure 112/61, pulse 78, temperature 98.5 F (36.9 C), temperature source Oral, resp. rate 20, height 5\' 1"  (1.549 m), weight 55.6 kg, SpO2 95 %.  Elderly, frail appearing female in no acute distress. HEENT exam atraumatic, normocephalic, extraocular muscles are intact. Neck is supple. No jugular venous distention no thyromegaly. Chest clear to auscultation without increased work of breathing. Cardiac exam S1 and S2 are irregular, 4/6 HSM heard best at apex and Right sternal border Abdominal exam active bowel sounds, soft, nontender. Extremities no edema. Neurologic exam she is alert without any motor sensory deficits. Gait is normal.    Assessment/Plan: 1. Functional deficits secondary to Right post fossa SDH   Medical Problem List and Plan: 1.  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  2.  St Jude's Mitral valve/A fib/ Antithrombotics: -DVT/anticoagulation:  Pharmaceutical: Coumadin        Lab Results  Component Value Date   INR 1.8 (H) 03/04/2019   INR 1.6 (H) 03/03/2019   INR 1.6 (H) 03/02/2019  pharmacy adjusting dose              -antiplatelet therapy: low dose ASA 3. Pain Management: headaches have improved/resolved 4. Mood: LCSW to follow for evaluation and support.  Team to provide ego support.              -antipsychotic agents: NA 5. Neuropsych: This patient is not fully capable of making decisions on her own behalf. 6. Skin/Wound Care: routine pressure relief measures.  7. Fluids/Electrolytes/Nutrition: Monitor I/O. Intake improving.  8. CAD/Chronic  diastolic [id number] heart disease  Currently stable. Filed Weights   02/17/19 1100 02/24/19 0500  Weight: 59 kg 58.8 kg  9. Pneumonitis with hematemesis/Hypoxia: Continue IV protonix. Will monitor for now.              Wean supplemental oxygen as tolerated 10. Chronic A fib/ SSS s/p PPM: Monitor HR tid--continue amiodarone. On coumadin.             Monitor with increased mobility 11. Acute on chronic anemia?:  Check iron panel.  Follow with serial checks. Transfuse if Hgb < 7.0.  Add iron supplement. Had hemoptysis but not ongoing,  stool guaic neg 6/6 Repeat CBC in am 6/15 12. CKD: Baseline SCr-1.3? Encourage fluid intake.  creat mildly elevated over baseline at 1.44, repeat BMET 6/15 13. Sleep disturbance:  Add trazodone prn.   14.  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

## 2019-03-06 ENCOUNTER — Inpatient Hospital Stay (HOSPITAL_COMMUNITY): Payer: Medicare Other | Admitting: Occupational Therapy

## 2019-03-06 ENCOUNTER — Inpatient Hospital Stay (HOSPITAL_COMMUNITY): Payer: Medicare Other

## 2019-03-06 ENCOUNTER — Inpatient Hospital Stay (HOSPITAL_COMMUNITY): Payer: Medicare Other | Admitting: Speech Pathology

## 2019-03-06 DIAGNOSIS — Z8679 Personal history of other diseases of the circulatory system: Secondary | ICD-10-CM

## 2019-03-06 MED ORDER — WARFARIN SODIUM 5 MG PO TABS
5.0000 mg | ORAL_TABLET | Freq: Once | ORAL | Status: AC
  Administered 2019-03-06: 18:00:00 5 mg via ORAL
  Filled 2019-03-06: qty 1

## 2019-03-06 NOTE — Progress Notes (Signed)
ANTICOAGULATION CONSULT NOTE   Pharmacy Consult for Warfarin Indication: atrial fibrillation and mechanical valve  Allergies  Allergen Reactions  . Codeine Nausea And Vomiting    Patient Measurements: Height: 5\' 1"  (154.9 cm) Weight: 222 lb 10.6 oz (101 kg) IBW/kg (Calculated) : 47.8  Vital Signs: Temp: 98.7 F (37.1 C) (06/15 0517) Temp Source: Oral (06/15 0517) BP: 110/60 (06/15 0517) Pulse Rate: 58 (06/15 0517)  Labs: Recent Labs    03/04/19 1256 03/05/19 1042  LABPROT 21.0* 24.1*  INR 1.8* 2.2*    Estimated Creatinine Clearance: 35.7 mL/min (A) (by C-G formula based on SCr of 1.44 mg/dL (H)).  Assessment: 74 yof s/p MCA M1 occlusion s/p IR revascularization. She is on chronic warfarin for afib and mechanical MVR. INR on admission was subtherapeutic at 1.9 so she was initiated on IV heparin as a bridge back to therapeutic warfarin. Unfortunately she developed an SDH and SAH. Heparin was reversed and warfarin held. Warfarin was resumed 6/5 without bridging and lower INR goal of 2-2.5.   Today, 03/06/2019 INR therapeutic at 2.2, first therapeutic level since Warfarin resumed 6/5 (INR 1.6 >> 1.8 (6/13)) Po intake adequate No s/s bleed. Last CBC 6/8, Plt wnl  Goal of Therapy:  INR 2-2.5 Monitor platelets by anticoagulation protocol: Yes   Plan:  Unable to obtain INR this morning/afternoon, d/w PA Will dose Warfarin at 5mg  today to attempt to maintain therapeutic INR  - Daily INR *Anticipate discharge warfarin regimen will need to be adjusted to accommodate new INR goal   Thank you for allowing pharmacy to be a part of this patient's care.  Minda Ditto PharmD [phone] Clinical Pharmacist 03/06/2019 12:35 PM   **Pharmacist phone directory can now be found on [email or web address] ([email or web address]).  Listed under [place].

## 2019-03-06 NOTE — Progress Notes (Signed)
Four different lab techs have been unsuccessful in trying to draw PT/INR from patient. Olin Hauser love, PA aware; Eilene Ghazi, MD aware. Cancelled new order stat lab for today. Coumadin order for 5mg  for today, continue plan of care. No s/s of distress at this time.  Audie Clear, LPN

## 2019-03-06 NOTE — Progress Notes (Signed)
Occupational Therapy Session Note  Patient Details  Name: Carrie [name] MRN: [id number] Date of Birth: Mar 03, 1941  Today's Date: 03/06/2019 OT Individual Time: 0786-7544 OT Individual Time Calculation (min): 68 min    Short Term Goals: Week 2:  OT Short Term Goal 1 (Week 2): Pt will continue to work on established LTGs set at supervision level  to min assist for discharge.  Skilled Therapeutic Interventions/Progress Updates:    Pt completed toilet transfer and toileting with min assist to begin session.  Increased time needed to complete transfer to the toilet with use of the RW for support.  She was then able to transfer over to the tub bench with min guard assist using the RW.  She needed min guard assist and min instructional cueing for sequencing bathing.  She then transferred out to the wheelchair for dressing with min guard assist and increased time.  Supervision for UB dressing with min assist for LB dressing sit to stand to complete session.  She was then left sitting in the wheelchair at bedside with call button and phone in reach and safety belt in place.    Therapy Documentation Precautions:  Precautions Precautions: Fall Restrictions Weight Bearing Restrictions: No  Pain: Pain Assessment Pain Scale: Faces Pain Score: 0-No pain ADL: See Care Tool Section for some details of ADL  Therapy/Group: Individual Therapy  Dabria Wadas OTR/L 03/06/2019, 11:29 AM

## 2019-03-06 NOTE — Progress Notes (Signed)
Occupational Therapy Session Note  Patient Details  Name: Carrie [name] MRN: [id number] Date of Birth: Jun 30, 1941  Today's Date: 03/06/2019 OT Individual Time: 1335-1416 OT Individual Time Calculation (min): 41 min    Short Term Goals: Week 2:  OT Short Term Goal 1 (Week 2): Pt will continue to work on established LTGs set at supervision level  to min assist for discharge.  Skilled Therapeutic Interventions/Progress Updates:    Pt worked on sit to stand, standing balance, and short distance functional mobility during session.  At start of session, pt was sitting up in her wheelchair with slight right lean noted.  She was then taken down to the dayroom where she transferred stand pivot with min guard assist to the therapy mat.  Min guard assist for sit to stand and standing from the mat while reaching side to side to pick up bean bags and toss them to the target.  She then slowly ambulated to the target and reached down and picked them up with min guard assist.  She completed four intervals of this with two 2-3 minute rest breaks in between.  Noted pt also with right lean in standing and with functional mobility during task.  Finished session with return to the wheelchair and pt being rolled back to her room.  She was left up in the wheelchair with call button and phone in reach and alarm belt in place.    Therapy Documentation Precautions:  Precautions Precautions: Fall Restrictions Weight Bearing Restrictions: No  Pain: Pain Assessment Pain Scale: Faces Pain Score: 0-No pain  Therapy/Group: Individual Therapy  Gwyn Hieronymus OTR/L 03/06/2019, 3:50 PM

## 2019-03-06 NOTE — Progress Notes (Signed)
[facility] PHYSICAL MEDICINE & REHABILITATION PROGRESS NOTE   Subjective/Complaints:  Pt aware of D/C date  ROS- denies CP, SOB, N/V/D, denies hemoptysis  Objective:   No results found. No results for input(s): WBC, HGB, HCT, PLT in the last 72 hours. No results for input(s): NA, K, CL, CO2, GLUCOSE, BUN, CREATININE, CALCIUM in the last 72 hours.  Intake/Output Summary (Last 24 hours) at 03/06/2019 0829 Last data filed at 03/05/2019 1817 Gross per 24 hour  Intake 416 ml  Output -  Net 416 ml     Physical Exam: Vital Signs Blood pressure 110/60, pulse (!) 58, temperature 98.7 F (37.1 C), temperature source Oral, resp. rate 18, height 5\' 1"  (1.549 m), weight 101 kg, SpO2 98 %.   General: No acute distress Mood and affect are appropriate Heart: Regular rate and rhythm no rubs murmurs or extra sounds Lungs: Clear to auscultation, breathing unlabored, no rales or wheezes Abdomen: Positive bowel sounds, soft nontender to palpation, nondistended Extremities: No clubbing, cyanosis, or edema Skin: No evidence of breakdown, no evidence of rash Neurologic: Cranial nerves II through XII intact, motor strength is 5/5 in bilateral deltoid, bicep, tricep, grip, hip flexor, knee extensors, ankle dorsiflexor and plantar flexor  Musculoskeletal: Full range of motion in all 4 extremities. No joint swelling   Assessment/Plan: 1. Functional deficits secondary to Right post fossa SDH  which require 3+ hours per day of interdisciplinary therapy in a comprehensive inpatient rehab setting.  Physiatrist is providing close team supervision and 24 hour management of active medical problems listed below.  Physiatrist and rehab team continue to assess barriers to discharge/monitor patient progress toward functional and medical goals  Care Tool:  Bathing    Body parts bathed by patient: Right arm, Left arm, Chest, Abdomen, Front perineal area, Buttocks, Right upper leg, Face, Left lower leg,  Right lower leg, Left upper leg   Body parts bathed by helper: Right lower leg, Left lower leg     Bathing assist Assist Level: Minimal Assistance - Patient > 75%     Upper Body Dressing/Undressing Upper body dressing   What is the patient wearing?: Pull over shirt    Upper body assist Assist Level: Supervision/Verbal cueing    Lower Body Dressing/Undressing Lower body dressing      What is the patient wearing?: Pants     Lower body assist Assist for lower body dressing: Minimal Assistance - Patient > 75%     Toileting Toileting Toileting Activity did not occur Landscape architect and hygiene only): Refused  Toileting assist Assist for toileting: Minimal Assistance - Patient > 75%     Transfers Chair/bed transfer  Transfers assist     Chair/bed transfer assist level: Contact Guard/Touching assist(stand pivot with RW)     Locomotion Ambulation   Ambulation assist      Assist level: Contact Guard/Touching assist Assistive device: Walker-rolling Max distance: 146ft   Walk 10 feet activity   Assist     Assist level: Contact Guard/Touching assist Assistive device: Walker-rolling   Walk 50 feet activity   Assist    Assist level: Contact Guard/Touching assist Assistive device: Walker-rolling    Walk 150 feet activity   Assist Walk 150 feet activity did not occur: Safety/medical concerns         Walk 10 feet on uneven surface  activity   Assist Walk 10 feet on uneven surfaces activity did not occur: Safety/medical concerns         Wheelchair  Assist Will patient use wheelchair at discharge?: (TBD )             Wheelchair 50 feet with 2 turns activity    Assist            Wheelchair 150 feet activity     Assist          Medical Problem List and Plan: 1.  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  St Jude's Mitral valve/A fib/ Antithrombotics: -DVT/anticoagulation:  Pharmaceutical: Coumadin              INR subtherapeutic at present             -antiplatelet therapy: low dose ASA 3. Pain Management: tylenol prn.  4. Mood: LCSW to follow for evaluation and support.  Team to provide ego support.              -antipsychotic agents: NA 5. Neuropsych: This patient is not fully capable of making decisions on her own behalf. 6. Skin/Wound Care: routine pressure relief measures.  7. Fluids/Electrolytes/Nutrition: Monitor I/O. Intake improving.  8. CAD/Chronic diastolic ZOX:WRUEAVWU heart disease with tricuspid regurg, MV replacement  Heart Healthy diet. SOB improving.  Strict I/O's. Continue lasix bid, spironolactone, lopressor with low dose ASA. Filed Weights   02/17/19 1100 02/24/19 0500  Weight: 59 kg 58.8 kg  9. Pneumonitis with hematemesis/Hypoxia: Continue IV protonix. Will monitor for now.              Wean supplemental oxygen as tolerated 10. Chronic A fib/ SSS s/p PPM: Monitor HR tid--continue amiodarone. On coumadin.             Monitor with increased mobility 11. Acute on chronic anemia?:  Check iron panel.  Follow with serial checks. Transfuse if Hgb < 7.0.  Add iron supplement. Had hemoptysis but not ongoing,  stool guaic neg 6/6 12. CKD: Baseline SCr-1.3? Encourage fluid intake.  creat mildly elevated over baseline at 1.44 13. Sleep disturbance:  Add trazodone prn.       LOS: 10 days A FACE TO FACE EVALUATION WAS PERFORMED  Carrie [name] 03/06/2019, 8:29 AM

## 2019-03-06 NOTE — Progress Notes (Addendum)
Speech Language Pathology Daily Session Note  Patient Details  Name: Carrie [name] MRN: [id number] Date of Birth: Feb 17, 1941  Today's Date: 03/06/2019 SLP Individual Time: 1445-1500 SLP Individual Time Calculation (min): 15 min  Short Term Goals: Week 1: SLP Short Term Goal 1 (Week 1): Pt will initiate basic familiar task in 7 out of 10 opportunities with Mod A cues. SLP Short Term Goal 2 (Week 1): Pt will demonstrate general safety awareness by answering [facility] questions with 75% accuracy and Mod A cues. SLP Short Term Goal 3 (Week 1): Pt will demonstrate sustained attention to basic familiar task for 10 minutes with Mod A cues. SLP Short Term Goal 4 (Week 1): Pt will follow 2 step simple direction in 7 out of 10 opportunties with Mod A cues. SLP Short Term Goal 5 (Week 1): Pt will utilize external memory aid to recall information with Mod A cues.  Skilled Therapeutic Interventions:  Skilled treatment session focused on cognition goals. SLP facilitated session by providing education on need to initiate task in timely manner. Pt given sorting task with goal of initiating task in timely manner. Pt able to increase task initiation. Education also provided on compensatory memory strategies with handout given. All questions answered to pt's satisfaction. Pt left upright in wheelchair with all needs within reach. Continue per current plan of care.      Pain Pain Assessment Pain Scale: Faces Pain Score: 0-No pain  Therapy/Group: Individual Therapy  Sharmin Foulk 03/06/2019, 5:00 PM

## 2019-03-06 NOTE — Progress Notes (Signed)
Physical Therapy Session Note  Patient Details  Name: Carrie [name] MRN: [id number] Date of Birth: 08-Jul-1941  Today's Date: 03/06/2019 PT Individual Time: 0370-9643 PT Individual Time Calculation (min): 72 min   Short Term Goals: Week 2:  PT Short Term Goal 1 (Week 2): = to LTGs based on ELOS  Skilled Therapeutic Interventions/Progress Updates:    Pt supine in bed using the bedpan upon PT arrival, agreeable to therapy tx and denies pain. Pt continent of bladder, pt performed rolling with supervision while therapist removed bedpan. Pt performed pericare with set up assist, donned clean brief with assist. Therapist educated pt on trying to get up to go to the bathroom as she will have to when she goes home in a few days. Pt transferred to sitting EOB with min assist, donned pants and performed sit<>stand with min assist to pull pants over hips. Pt performed stand pivot to w/c with CGA and increased time. Pt seated in w/c to brush teeth, brush hair and wash face, set up assist. Pt transported to the gym. Pt ambulated from gym>rehab apartment with RW and CGA x 100 ft, cues for increased step length and foot placement with turns. Pt performed furniture transfer this session on/off the couch with min assist to stand from lower surface. Pt ambulated back to the gym x 100 ft with RW and CGA. Pt ambulated 2 x 55 ft from mat<>steps this session with RW and CGA, cues for increased step length. Pt worked on Industrial/product designer this session per home set up. Pt ascended/descended 4 steps without using rails, step to pattern with therapist providing mod assist going up the steps, min assist going down the steps with pt using therapists shoulders for UE support. Pt performed stand pivot to w/c with CGA and transported back to room. Pt left seated in w/c in care of NT.   Therapy Documentation Precautions:  Precautions Precautions: Fall Restrictions Weight Bearing Restrictions: No    Therapy/Group: Individual  Therapy  Netta Corrigan, PT, DPT 03/06/2019, 7:50 AM

## 2019-03-07 ENCOUNTER — Inpatient Hospital Stay (HOSPITAL_COMMUNITY): Payer: Medicare Other | Admitting: Occupational Therapy

## 2019-03-07 ENCOUNTER — Inpatient Hospital Stay (HOSPITAL_COMMUNITY): Payer: Medicare Other

## 2019-03-07 ENCOUNTER — Inpatient Hospital Stay (HOSPITAL_COMMUNITY): Payer: Medicare Other | Admitting: Physical Therapy

## 2019-03-07 LAB — CBC
HCT: 34.7 % — ABNORMAL LOW (ref 36.0–46.0)
Hemoglobin: 11.7 g/dL — ABNORMAL LOW (ref 12.0–15.0)
MCH: 29.1 pg (ref 26.0–34.0)
MCHC: 33.7 g/dL (ref 30.0–36.0)
MCV: 86.3 fL (ref 80.0–100.0)
Platelets: 192 10*3/uL (ref 150–400)
RBC: 4.02 MIL/uL (ref 3.87–5.11)
RDW: 28.6 % — ABNORMAL HIGH (ref 11.5–15.5)
WBC: 5.1 10*3/uL (ref 4.0–10.5)
nRBC: 0.6 % — ABNORMAL HIGH (ref 0.0–0.2)

## 2019-03-07 LAB — BASIC METABOLIC PANEL
Anion gap: 9 (ref 5–15)
BUN: 32 mg/dL — ABNORMAL HIGH (ref 8–23)
CO2: 22 mmol/L (ref 22–32)
Calcium: 9.2 mg/dL (ref 8.9–10.3)
Chloride: 103 mmol/L (ref 98–111)
Creatinine, Ser: 1.4 mg/dL — ABNORMAL HIGH (ref 0.44–1.00)
GFR calc Af Amer: 42 mL/min — ABNORMAL LOW (ref >60)
GFR calc non Af Amer: 36 mL/min — ABNORMAL LOW (ref >60)
Glucose, Bld: 119 mg/dL — ABNORMAL HIGH (ref 70–99)
Potassium: 3.6 mmol/L (ref 3.5–5.1)
Sodium: 134 mmol/L — ABNORMAL LOW (ref 135–145)

## 2019-03-07 LAB — PROTIME-INR
INR: 2.5 — ABNORMAL HIGH (ref 0.8–1.2)
Prothrombin Time: 26.6 seconds — ABNORMAL HIGH (ref 11.4–15.2)

## 2019-03-07 MED ORDER — WARFARIN SODIUM 2.5 MG PO TABS
2.5000 mg | ORAL_TABLET | Freq: Once | ORAL | Status: AC
  Administered 2019-03-07: 18:00:00 2.5 mg via ORAL
  Filled 2019-03-07: qty 1

## 2019-03-07 NOTE — Progress Notes (Signed)
Patient ID: Carrie [name], female   DOB: 08-05-1941, 78 y.o.   MRN: [id number]      Diagnosis codes:   I63.411; I69.351; I62.00; I60.9   Height:   5'1"             Weight:   123         Patient suffers from a right MCA CVA with posterior SDH/SAH which impairs their ability to perform daily activities like toileting, feeding, grooming, and bathing in the home.  A walker or cane will not resolve issue with performing activities of daily living.  A wheelchair will allow patient to safely perform daily activities.  Patient is not able to propel themselves in the home using a standard wheelchair due to general weakness and endurance .  A caregiver is available, willing, and able to provide assistance with the wheelchair.

## 2019-03-07 NOTE — Progress Notes (Signed)
Physical Therapy Session Note  Patient Details  Name: Carrie [name] MRN: [id number] Date of Birth: April 04, 1941  Today's Date: 03/07/2019 PT Individual Time: 1100-1200 PT Individual Time Calculation (min): 60 min   Short Term Goals: Week 2:  PT Short Term Goal 1 (Week 2): = to LTGs based on ELOS  Skilled Therapeutic Interventions/Progress Updates:   Pt received seated in w/c in room finishing having her hair braided by NT. Pt agreeable to therapy with no complaints of pain. Pt reports urge to urinate. Sit to stand with Supervision to RW. Pt begins to urinate upon standing, ambulation to bathroom with RW and Supervision. Toilet transfer with Supervision. Pt is dependent for brief change following urinary incontinence. Pt's husband Jeneen Rinks present for hands-on family education session. Demonstrated how to assist pt with BUE on shoulders or forearms of person assisting to ascend/descend 4 stairs with no handrails, min A. Pt's husband is able to demo ability to safely assist pt on stairs. He is also able to demonstrate how to safely assist pt in/out of a car with min HHA. Education with Jeneen Rinks about pt's delayed processing and increased time needed to follow verbal and tactile cues with transfers and mobility. He states his understanding. Pt left seated in w/c in room with needs in reach, quick release belt and chair alarm in place at end of session.  Therapy Documentation Precautions:  Precautions Precautions: Fall Restrictions Weight Bearing Restrictions: No   Therapy/Group: Individual Therapy   Excell Seltzer, PT, DPT  03/07/2019, 12:29 PM

## 2019-03-07 NOTE — Progress Notes (Signed)
Speech Language Pathology Discharge Summary  Patient Details  Name: Carrie [name] MRN: [id number] Date of Birth: 1941/02/15    Patient has met 5 of 5 long term goals.  Patient to discharge at overall Mod level.    Clinical Impression/Discharge Summary:   Pt is currently functioning at Mod A for all activities and per husband's report, pt was at this level at baseline. Pt's husband states that he has been and is willing to continue to provide this level of support. All education completed.   Care Partner:  Caregiver Able to Provide Assistance: Yes  Type of Caregiver Assistance: Physical;Cognitive  Recommendation:  None      Equipment:   N/A  Reasons for discharge: Discharged from hospital   Patient/Family Agrees with Progress Made and Goals Achieved: Yes    Corrina Steffensen 03/07/2019, 6:49 PM

## 2019-03-07 NOTE — Progress Notes (Signed)
Physical Therapy Discharge Summary  Patient Details  Name: Carrie [name] MRN: [id number] Date of Birth: 03/26/1941  Today's Date: 03/07/2019 PT Individual Time: 1535-1635 PT Individual Time Calculation (min): 60 min    Patient has met 9 of 10 long term goals due to improved activity tolerance, improved balance, improved postural control, increased strength and improved activity tolerance.  Patient to discharge at an ambulatory level Min Assist.   Patient's care partner attended family education training and is independent to provide the necessary physical assistance at discharge.  Reasons goals not met: Patient continues to require min assist for car transfers.  Recommendation:  Patient will benefit from ongoing skilled PT services in home health setting to continue to advance safe functional mobility, address ongoing impairments in strength, bed mobility, transfers, gait, stair navigation, standing balance, activity tolerance, and minimize fall risk.  Equipment: No equipment provided - pt has all necessary equipment  Reasons for discharge: treatment goals met and discharge from hospital  Patient/family agrees with progress made and goals achieved: Yes   Skilled Therapeutic Interventions/Progress Updates:  Patient received supine in bed and agreeable to therapy session. Supine<>sit without bed features with supervision for safety and significantly increased time due to decreased speed of movement. Sit>stand EOB>RW with close supervision for safety. Stand pivot transfer EOB>w/c using RW with close supervision for safety and significantly increased time with pt continuing to demonstrate delayed/impaired motor planning and significantly decreased speed of movement - these impairments are noted to be more impaired during stand pivot transfers due to it requiring increased steps to perform mobility task. Transported to/from gym in w/c. Ambulated 88ft using RW with CGA for safety/steadying.  Performed furniture transfer form recliner with close supervision for safety. Ambulated ~15ft up/down ramp using RW with CGA for steadying. Transported back to room and pt left sitting in w/c with needs in reach and seat belt alarm on.   PT Discharge Precautions/Restrictions Precautions Precautions: Fall Restrictions Weight Bearing Restrictions: No Pain Pain Assessment Pain Scale: 0-10 Pain Score: 0-No pain(reports headache pain earlier today and that she had medication for it) Pain Descriptors / Indicators: Headache Pain Intervention(s): Other (Comment)(therapy to tolerance) Vision/Perception  Perception Perception: Within Functional Limits Praxis Praxis: Impaired Praxis Impairment Details: Motor planning Praxis-Other Comments: Delayed initiation of movements, poor motor planning to participate in MMT requiring max cuing and external targets, and significantly decreased speed of movement for all functional mobility tasks  Cognition Overall Cognitive Status: Impaired/Different from baseline Arousal/Alertness: Awake/alert Orientation Level: Oriented X4 Attention: Focused;Sustained Focused Attention: Appears intact Sustained Attention: Appears intact Awareness: Appears intact Safety/Judgment: Appears intact Sensation Sensation Light Touch: Appears Intact Hot/Cold: Not tested Proprioception: Appears Intact Stereognosis: Not tested Coordination Gross Motor Movements are Fluid and Coordinated: No Coordination and Movement Description: Significantly decreased speed of movements with all activities but movements are coordinated Motor  Motor Motor: Other (comment);Abnormal postural alignment and control Motor - Discharge Observations: still with generalized weakness, slow movements with all activities and right sided lean in sitting and standing  Mobility Bed Mobility Bed Mobility: Supine to Sit;Sit to Supine Supine to Sit: Supervision/Verbal cueing Sit to Supine:  Supervision/Verbal cueing Transfers Transfers: Sit to Stand;Stand to Sit;Stand Pivot Transfers Sit to Stand: Supervision/Verbal cueing Stand to Sit: Supervision/Verbal cueing Stand Pivot Transfers: Supervision/Verbal cueing Stand Pivot Transfer Details: Verbal cues for safe use of DME/AE;Verbal cues for sequencing;Verbal cues for technique;Verbal cues for gait pattern Transfer (Assistive device): Rolling walker Locomotion  Gait Ambulation: Yes Gait Assistance: Contact Guard/Touching assist Gait Distance (  Feet): 88 Feet Assistive device: Rolling walker Gait Assistance Details: Verbal cues for gait pattern;Verbal cues for technique;Verbal cues for safe use of DME/AE Gait Gait: Yes Gait Pattern: Impaired Gait Pattern: Decreased stride length;Decreased step length - left;Decreased step length - right;Poor foot clearance - left;Poor foot clearance - right;Trunk flexed(significantly decreased speed of movement) Gait velocity: significantly decreased  Stairs / Additional Locomotion Stairs: Yes(performed with other PT today) Stairs Assistance: Minimal Assistance - Patient > 75% Stair Management Technique: No rails Number of Stairs: 4 Height of Stairs: 6 Ramp: Contact Guard/touching assist Wheelchair Mobility Wheelchair Mobility: No  Trunk/Postural Assessment  Cervical Assessment Cervical Assessment: Within Functional Limits Thoracic Assessment Thoracic Assessment: Within Functional Limits Lumbar Assessment Lumbar Assessment: Within Functional Limits Postural Control Postural Control: Deficits on evaluation Righting Reactions: impaired Protective Responses: impaired Postural Limitations: decreased  Balance Balance Balance Assessed: Yes Static Sitting Balance Static Sitting - Balance Support: Feet supported Static Sitting - Level of Assistance: 5: Stand by assistance Dynamic Sitting Balance Dynamic Sitting - Balance Support: Feet supported Dynamic Sitting - Level of Assistance:  5: Stand by assistance Static Standing Balance Static Standing - Balance Support: During functional activity;Bilateral upper extremity supported Static Standing - Level of Assistance: 5: Stand by assistance;4: Min assist Dynamic Standing Balance Dynamic Standing - Balance Support: During functional activity;Bilateral upper extremity supported Dynamic Standing - Level of Assistance: 5: Stand by assistance;4: Min assist Extremity Assessment  RLE Strength Right Hip Flexion: 4/5 Right Knee Flexion: 4+/5 Right Knee Extension: 4+/5 Right Ankle Dorsiflexion: 4/5 Right Ankle Plantar Flexion: 4/5 LLE Assessment LLE Assessment: Exceptions to [facility] LLE Strength Left Hip Flexion: 4/5 Left Knee Flexion: 4/5 Left Knee Extension: 4+/5 Left Ankle Dorsiflexion: 4-/5 Left Ankle Plantar Flexion: 4-/5    Tawana Scale, PT, DPT 03/07/2019, 3:34 PM

## 2019-03-07 NOTE — Progress Notes (Signed)
Occupational Therapy Discharge Summary  Patient Details  Name: Carrie [name] MRN: [id number] Date of Birth: April 15, 1941  Today's Date: 03/07/2019 OT Individual Time: 0900-1002 OT Individual Time Calculation (min): 62 min   Session Note:  Pt completed toilet transfer with use of the RW with min guard assist.  She was able to complete toileting with min guard assist from the 3:1 before transferring to the wheelchair at the sink.  She completed all bathing with supervision sit to stand at the sink.  Min assist for LB dressing with supervision for UB dressing.  Finished session with NT in the room and pt continuing to work on grooming task of brushing her teeth.    Patient has met 6 of 8 long term goals due to improved activity tolerance, improved balance, postural control, ability to compensate for deficits, improved attention and improved awareness.  Patient to discharge at overall Supervision level.  Patient's care partner is independent to provide the necessary physical and cognitive assistance at discharge.    Reasons goals not met: Pt needs min assist for dynamic standing balance as well as min guard assist for toilet transfers  Recommendation:  Patient will benefit from ongoing skilled OT services in home health setting to continue to advance functional skills in the area of BADL and Reduce care partner burden.  Pt still with slow movements during all selfcare tasks and functional transfers to the toilet, bed, and shower bench.  Feel she will benefit from continued OT for further progression with ADLs as she currently is at a min assist to supervision level.    Equipment: 3;1 and tub bench  Reasons for discharge: treatment goals met and discharge from hospital  Patient/family agrees with progress made and goals achieved: Yes  OT Discharge Precautions/Restrictions  Precautions Precautions: Fall Restrictions Weight Bearing Restrictions: No Pain Pain Assessment Pain Scale: Faces Pain  Score: 0-No pain ADL ADL Eating: Independent Where Assessed-Eating: Wheelchair Grooming: Supervision/safety Where Assessed-Grooming: Sitting at sink Upper Body Bathing: Supervision/safety Where Assessed-Upper Body Bathing: Wheelchair Lower Body Bathing: Supervision/safety Where Assessed-Lower Body Bathing: Wheelchair Upper Body Dressing: Supervision/safety Where Assessed-Upper Body Dressing: Wheelchair Lower Body Dressing: Minimal assistance Where Assessed-Lower Body Dressing: Wheelchair Toileting: Minimal assistance Where Assessed-Toileting: Bedside Commode Toilet Transfer: Therapist, music Method: Counselling psychologist: Radiographer, therapeutic: Moderate assistance Tub/Shower Transfer Method: Stand pivot Tub/Shower Equipment: Facilities manager: Environmental education officer Method: Heritage manager: Gaffer Baseline Vision/History: Wears glasses Wears Glasses: At all times Patient Visual Report: No change from baseline Vision Assessment?: No apparent visual deficits Perception  Perception: Within Functional Limits Praxis Praxis: Impaired Praxis Impairment Details: Ideomotor Praxis-Other Comments: Pt with slow movements during all mobility and transfers indicative of some motor planning delay. Cognition Overall Cognitive Status: Impaired/Different from baseline Arousal/Alertness: Awake/alert Orientation Level: Oriented X4 Attention: Selective;Sustained Focused Attention: Appears intact Sustained Attention: Appears intact Sustained Attention Impairment: Functional basic Selective Attention: Impaired Memory: Impaired Memory Impairment: Decreased recall of new information Awareness: Impaired Awareness Impairment: Anticipatory impairment;Emergent impairment Problem Solving: Impaired Problem Solving Impairment: Functional basic Executive Function:  Initiating Safety/Judgment: Impaired Sensation Sensation Light Touch: Appears Intact Hot/Cold: Appears Intact Proprioception: Appears Intact Stereognosis: Not tested Coordination Gross Motor Movements are Fluid and Coordinated: No Fine Motor Movements are Fluid and Coordinated: No Coordination and Movement Description: Movements are slow with all activities Motor  Motor Motor: Abnormal postural alignment and control Motor - Discharge Observations: still with generalized weakness, slow movements  with all activities and right sided lean in sitting and standing Mobility  Bed Mobility Bed Mobility: Supine to Sit Supine to Sit: Supervision/Verbal cueing Transfers Sit to Stand: Supervision/Verbal cueing Stand to Sit: Supervision/Verbal cueing  Trunk/Postural Assessment  Cervical Assessment Cervical Assessment: Within Functional Limits Thoracic Assessment Thoracic Assessment: Within Functional Limits Lumbar Assessment Lumbar Assessment: Exceptions to WFL(posterior pelvic tilt)  Balance Balance Balance Assessed: Yes Static Sitting Balance Static Sitting - Balance Support: Right upper extremity supported;Feet supported Static Sitting - Level of Assistance: 5: Stand by assistance Dynamic Sitting Balance Dynamic Sitting - Balance Support: During functional activity Dynamic Sitting - Level of Assistance: 5: Stand by assistance Static Standing Balance Static Standing - Balance Support: During functional activity Static Standing - Level of Assistance: 5: Stand by assistance Dynamic Standing Balance Dynamic Standing - Balance Support: During functional activity Dynamic Standing - Level of Assistance: 4: Min assist Extremity/Trunk Assessment RUE Assessment RUE Assessment: Within Functional Limits Active Range of Motion (AROM) Comments: WNL General Strength Comments: 3+/5 grossly  LUE Assessment LUE Assessment: Within Functional Limits Active Range of Motion (AROM) Comments:  WNL General Strength Comments: 3+/5 grossly    Jester Klingberg OTR/L 03/07/2019, 12:48 PM

## 2019-03-07 NOTE — Progress Notes (Signed)
ANTICOAGULATION CONSULT NOTE   Pharmacy Consult for Warfarin Indication: atrial fibrillation and mechanical valve  Allergies  Allergen Reactions  . Codeine Nausea And Vomiting    Patient Measurements: Height: 5\' 1"  (154.9 cm) Weight: 223 lb 2.8 oz (101.2 kg) IBW/kg (Calculated) : 47.8  Vital Signs: Temp: 98 F (36.7 C) (06/16 0514) Temp Source: Oral (06/16 0514) BP: 131/68 (06/16 0514) Pulse Rate: 50 (06/16 0514)  Labs: Recent Labs    03/04/19 1256 03/05/19 1042 03/07/19 0720  LABPROT 21.0* 24.1* 26.6*  INR 1.8* 2.2* 2.5*    Estimated Creatinine Clearance: 35.7 mL/min (A) (by C-G formula based on SCr of 1.44 mg/dL (H)).  Assessment: 66 yof s/p MCA M1 occlusion s/p IR revascularization. She is on chronic warfarin for afib and mechanical MVR. INR on admission was subtherapeutic at 1.9 so she was initiated on IV heparin as a bridge back to therapeutic warfarin. Unfortunately she developed an SDH and SAH. Heparin was reversed and warfarin held. Warfarin was resumed 6/5 without bridging and lower INR goal of 2-2.5.   Today, 03/07/2019 INR therapeutic at 2.5 Po intake adequate No s/s bleed. Last CBC 6/8, Plt wnl  Goal of Therapy:  INR 2-2.5 Monitor platelets by anticoagulation protocol: Yes   Plan:  Will dose Warfarin at 2.5 mg today to attempt to maintain therapeutic INR   INR TTS *Anticipate discharge warfarin regimen will need to be adjusted to accommodate new INR goal   Allyse Fregeau A. Levada Dy, PharmD, [place] Pager: [phone] Please utilize Amion for appropriate phone number to reach the unit pharmacist ([place])

## 2019-03-07 NOTE — Progress Notes (Signed)
[facility] PHYSICAL MEDICINE & REHABILITATION PROGRESS NOTE   Subjective/Complaints:  Patient has no new complaints this morning.  ROS- denies CP, SOB, N/V/D, denies hemoptysis  Objective:   No results found. No results for input(s): WBC, HGB, HCT, PLT in the last 72 hours. No results for input(s): NA, K, CL, CO2, GLUCOSE, BUN, CREATININE, CALCIUM in the last 72 hours.  Intake/Output Summary (Last 24 hours) at 03/07/2019 0903 Last data filed at 03/07/2019 0759 Gross per 24 hour  Intake 358 ml  Output -  Net 358 ml     Physical Exam: Vital Signs Blood pressure 131/68, pulse (!) 50, temperature 98 F (36.7 C), temperature source Oral, resp. rate 16, height 5\' 1"  (1.549 m), weight 101.2 kg, SpO2 97 %.   General: No acute distress Mood and affect are appropriate Heart: Regular rate and rhythm no rubs murmurs or extra sounds Lungs: Clear to auscultation, breathing unlabored, no rales or wheezes Abdomen: Positive bowel sounds, soft nontender to palpation, nondistended Extremities: No clubbing, cyanosis, or edema Skin: No evidence of breakdown, no evidence of rash Neurologic: Cranial nerves II through XII intact, motor strength is 5/5 in bilateral deltoid, bicep, tricep, grip, hip flexor, knee extensors, ankle dorsiflexor and plantar flexor  Musculoskeletal: Full range of motion in all 4 extremities. No joint swelling   Assessment/Plan: 1. Functional deficits secondary to Right post fossa SDH  which require 3+ hours per day of interdisciplinary therapy in a comprehensive inpatient rehab setting.  Physiatrist is providing close team supervision and 24 hour management of active medical problems listed below.  Physiatrist and rehab team continue to assess barriers to discharge/monitor patient progress toward functional and medical goals  Care Tool:  Bathing    Body parts bathed by patient: Right arm, Left arm, Chest, Abdomen, Front perineal area, Buttocks, Right upper leg,  Left upper leg, Right lower leg, Left lower leg, Face   Body parts bathed by helper: Right lower leg, Left lower leg     Bathing assist Assist Level: Contact Guard/Touching assist     Upper Body Dressing/Undressing Upper body dressing   What is the patient wearing?: Pull over shirt    Upper body assist Assist Level: Set up assist    Lower Body Dressing/Undressing Lower body dressing      What is the patient wearing?: Pants     Lower body assist Assist for lower body dressing: Minimal Assistance - Patient > 75%     Toileting Toileting Toileting Activity did not occur Landscape architect and hygiene only): Refused  Toileting assist Assist for toileting: Contact Guard/Touching assist     Transfers Chair/bed transfer  Transfers assist     Chair/bed transfer assist level: Contact Guard/Touching assist     Locomotion Ambulation   Ambulation assist      Assist level: Minimal Assistance - Patient > 75% Assistive device: Walker-rolling Max distance: 10'   Walk 10 feet activity   Assist     Assist level: Contact Guard/Touching assist Assistive device: Walker-rolling   Walk 50 feet activity   Assist    Assist level: Contact Guard/Touching assist Assistive device: Walker-rolling    Walk 150 feet activity   Assist Walk 150 feet activity did not occur: Safety/medical concerns         Walk 10 feet on uneven surface  activity   Assist Walk 10 feet on uneven surfaces activity did not occur: Safety/medical concerns         Wheelchair     Assist Will  patient use wheelchair at discharge?: (TBD )             Wheelchair 50 feet with 2 turns activity    Assist            Wheelchair 150 feet activity     Assist          Medical Problem List and Plan: 1.  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  2.  St Jude's Mitral valve/A fib/ Antithrombotics: -DVT/anticoagulation:  Pharmaceutical: Coumadin              INR subtherapeutic at present             -antiplatelet therapy: low dose ASA 3. Pain Management: tylenol prn.  4. Mood: LCSW to follow for evaluation and support.  Team to provide ego support.              -antipsychotic agents: NA 5. Neuropsych: This patient is not fully capable of making decisions on her own behalf. 6. Skin/Wound Care: routine pressure relief measures.  7. Fluids/Electrolytes/Nutrition: Monitor I/O. Intake improving.  8. CAD/Chronic diastolic ONG:EXBMWUXL heart disease with tricuspid regurg, MV replacement  Heart Healthy diet. SOB improving.  Strict I/O's. Continue lasix bid, spironolactone, lopressor with low dose ASA. Filed Weights   02/17/19 1100 02/24/19 0500  Weight: 59 kg 58.8 kg  9. Pneumonitis with hematemesis/Hypoxia: Continue IV protonix. Will monitor for now.              Wean supplemental oxygen as tolerated 10. Chronic A fib/ SSS s/p PPM: Monitor HR tid--continue amiodarone. On coumadin.             Monitor with increased mobility 11. Acute on chronic anemia?:  Check iron panel.  Follow with serial checks. Transfuse if Hgb < 7.0.  Add iron supplement. Had hemoptysis but not ongoing,  stool guaic neg 6/6 12. CKD: Baseline SCr-1.3? Encourage fluid intake.  creat mildly elevated over baseline at 1.44 13. Sleep disturbance:  Add trazodone prn.       LOS: 11 days A FACE TO FACE EVALUATION WAS PERFORMED  Charlett Blake 03/07/2019, 9:03 AM

## 2019-03-08 ENCOUNTER — Other Ambulatory Visit: Payer: Self-pay | Admitting: Physical Medicine and Rehabilitation

## 2019-03-08 DIAGNOSIS — R519 Headache, unspecified: Secondary | ICD-10-CM

## 2019-03-08 DIAGNOSIS — I6200 Nontraumatic subdural hemorrhage, unspecified: Secondary | ICD-10-CM

## 2019-03-08 MED ORDER — WARFARIN SODIUM 2.5 MG PO TABS: 2.5000 mg | ORAL_TABLET | Freq: Every day | ORAL | 0 refills | Status: DC

## 2019-03-08 MED ORDER — TOPIRAMATE 25 MG PO TABS: 25.0000 mg | ORAL_TABLET | Freq: Two times a day (BID) | ORAL | 1 refills | Status: DC

## 2019-03-08 MED ORDER — OXYBUTYNIN CHLORIDE 5 MG PO TABS: 2.5000 mg | ORAL_TABLET | Freq: Every day | ORAL | 1 refills | Status: AC

## 2019-03-08 MED ORDER — FUROSEMIDE 80 MG PO TABS: 80.0000 mg | ORAL_TABLET | Freq: Two times a day (BID) | ORAL | 0 refills | Status: DC

## 2019-03-08 MED ORDER — ACETAMINOPHEN 325 MG PO TABS: 325.0000 mg | ORAL_TABLET | ORAL | Status: AC | PRN

## 2019-03-08 MED ORDER — PANTOPRAZOLE SODIUM 40 MG PO TBEC: 40.0000 mg | DELAYED_RELEASE_TABLET | Freq: Every day | ORAL | 1 refills | Status: AC

## 2019-03-08 MED ORDER — METOPROLOL TARTRATE 25 MG PO TABS: 25.0000 mg | ORAL_TABLET | Freq: Two times a day (BID) | ORAL | 1 refills | Status: DC

## 2019-03-08 MED ORDER — WARFARIN SODIUM 2.5 MG PO TABS: 2.5000 mg | ORAL_TABLET | Freq: Once | ORAL | Status: DC

## 2019-03-08 MED ORDER — SPIRONOLACTONE 25 MG PO TABS: 12.5000 mg | ORAL_TABLET | Freq: Every day | ORAL | 1 refills | Status: AC

## 2019-03-08 NOTE — Progress Notes (Signed)
Patient alert and oriented and ready for discharge. Patient has no complaints of no pain and is going home with husband. Algis Liming, PA has given discharge orders and the patient and husband understand discharge instructions. Patient left unit via wheelchair with nurse tech and I with no complications getting into car.

## 2019-03-08 NOTE — Progress Notes (Signed)
[facility] PHYSICAL MEDICINE & REHABILITATION PROGRESS NOTE   Subjective/Complaints:  Pt aware of d/c today , she denies pains except for occ mild HA that is generally improving   ROS- denies CP, SOB, N/V/D, denies hemoptysis  Objective:   No results found. Recent Labs    03/07/19 1232  WBC 5.1  HGB 11.7*  HCT 34.7*  PLT 192   Recent Labs    03/07/19 1425  NA 134*  K 3.6  CL 103  CO2 22  GLUCOSE 119*  BUN 32*  CREATININE 1.40*  CALCIUM 9.2    Intake/Output Summary (Last 24 hours) at 03/08/2019 0943 Last data filed at 03/08/2019 0824 Gross per 24 hour  Intake 804 ml  Output -  Net 804 ml     Physical Exam: Vital Signs Blood pressure 107/68, pulse 67, temperature 98.1 F (36.7 C), temperature source Oral, resp. rate 16, height 5\' 1"  (1.549 m), weight 54.9 kg, SpO2 95 %.   General: No acute distress Mood and affect are appropriate Heart: Regular rate and rhythm no rubs murmurs or extra sounds Lungs: Clear to auscultation, breathing unlabored, no rales or wheezes Abdomen: Positive bowel sounds, soft nontender to palpation, nondistended Extremities: No clubbing, cyanosis, or edema Skin: No evidence of breakdown, no evidence of rash Neurologic: Cranial nerves II through XII intact, motor strength is 5/5 in bilateral deltoid, bicep, tricep, grip, hip flexor, knee extensors, ankle dorsiflexor and plantar flexor  Musculoskeletal: Full range of motion in all 4 extremities. No joint swelling   Assessment/Plan:  1. Functional deficits secondary to Right post fossa SDH   Stable for D/C today F/u PCP in 3-4 weeks F/u PM&R 2 weeks See D/C summary  See D/C instructions  Care Tool:  Bathing    Body parts bathed by patient: Right arm, Left arm, Chest, Abdomen, Front perineal area, Buttocks, Right upper leg, Left upper leg, Right lower leg, Left lower leg, Face   Body parts bathed by helper: Right lower leg, Left lower leg     Bathing assist Assist Level:  Supervision/Verbal cueing     Upper Body Dressing/Undressing Upper body dressing   What is the patient wearing?: Pull over shirt    Upper body assist Assist Level: Supervision/Verbal cueing    Lower Body Dressing/Undressing Lower body dressing      What is the patient wearing?: Incontinence brief, Pants     Lower body assist Assist for lower body dressing: Minimal Assistance - Patient > 75%     Toileting Toileting Toileting Activity did not occur Landscape architect and hygiene only): Refused  Toileting assist Assist for toileting: Minimal Assistance - Patient > 75%     Transfers Chair/bed transfer  Transfers assist     Chair/bed transfer assist level: Supervision/Verbal cueing     Locomotion Ambulation   Ambulation assist      Assist level: Contact Guard/Touching assist Assistive device: Walker-rolling Max distance: 24ft   Walk 10 feet activity   Assist     Assist level: Contact Guard/Touching assist Assistive device: Walker-rolling   Walk 50 feet activity   Assist    Assist level: Contact Guard/Touching assist Assistive device: Walker-rolling    Walk 150 feet activity   Assist Walk 150 feet activity did not occur: Safety/medical concerns         Walk 10 feet on uneven surface  activity   Assist Walk 10 feet on uneven surfaces activity did not occur: Safety/medical concerns   Assist level: Contact Guard/Touching assist(up/down ramp) Assistive  device: Aeronautical engineer Will patient use wheelchair at discharge?: No             Wheelchair 50 feet with 2 turns activity    Assist            Wheelchair 150 feet activity     Assist          Medical Problem List and Plan: 1.  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  St Jude's Mitral  valve/A fib/ Antithrombotics: -DVT/anticoagulation:  Pharmaceutical: Coumadin              INR subtherapeutic at present             -antiplatelet therapy: low dose ASA 3. Pain Management: tylenol prn.  4. Mood: LCSW to follow for evaluation and support.  Team to provide ego support.              -antipsychotic agents: NA 5. Neuropsych: This patient is not fully capable of making decisions on her own behalf. 6. Skin/Wound Care: routine pressure relief measures.  7. Fluids/Electrolytes/Nutrition: Monitor I/O. Intake improving.  8. CAD/Chronic diastolic YYQ:MGNOIBBC heart disease with tricuspid regurg, MV replacement  Heart Healthy diet. SOB improving.  Strict I/O's. Continue lasix bid, spironolactone, lopressor with low dose ASA. Filed Weights   02/17/19 1100 02/24/19 0500  Weight: 59 kg 58.8 kg  9. Pneumonitis with hematemesis/Hypoxia: Continue IV protonix. Will monitor for now.              Wean supplemental oxygen as tolerated 10. Chronic A fib/ SSS s/p PPM: Monitor HR tid--continue amiodarone. On coumadin.             Monitor with increased mobility 11. Acute on chronic anemia?:  improved 12. CKD: Baseline SCr-1.3? Encourage fluid intake.  creat mildly elevated over baseline but stable  13. Sleep disturbance:  Add trazodone prn.       LOS: 12 days A FACE TO [place] Lanitra Battaglini 03/08/2019, 9:43 AM

## 2019-03-08 NOTE — Progress Notes (Signed)
ANTICOAGULATION CONSULT NOTE   Pharmacy Consult for Warfarin Indication: atrial fibrillation and mechanical valve  Allergies  Allergen Reactions  . Codeine Nausea And Vomiting    Patient Measurements: Height: 5\' 1"  (154.9 cm) Weight: 121 lb 0.5 oz (54.9 kg) IBW/kg (Calculated) : 47.8  Vital Signs: Temp: 98.1 F (36.7 C) (06/17 0345) Temp Source: Oral (06/17 0345) BP: 107/68 (06/17 0345) Pulse Rate: 67 (06/17 0345)  Labs: Recent Labs    03/05/19 1042 03/07/19 0720 03/07/19 1232 03/07/19 1425  HGB  --   --  11.7*  --   HCT  --   --  34.7*  --   PLT  --   --  192  --   LABPROT 24.1* 26.6*  --   --   INR 2.2* 2.5*  --   --   CREATININE  --   --   --  1.40*    Estimated Creatinine Clearance: 25.4 mL/min (A) (by C-G formula based on SCr of 1.4 mg/dL (H)).  Assessment: 58 yof s/p MCA M1 occlusion s/p IR revascularization. She is on chronic warfarin for afib and mechanical MVR. INR on admission was subtherapeutic at 1.9 so she was initiated on IV heparin as a bridge back to therapeutic warfarin. Unfortunately she developed an SDH and SAH. Heparin was reversed and warfarin held. Warfarin was resumed 6/5 without bridging and lower INR goal of 2-2.5.   Today, 03/08/2019 INR being drawn qTTS, no INR today Po intake adequate No s/s bleed. Last CBC 6/8, Plt wnl  Goal of Therapy:  INR 2-2.5 Monitor platelets by anticoagulation protocol: Yes   Plan:  Will dose Warfarin at 2.5 mg today  INR in AM   INR TTS *Anticipate discharge warfarin regimen will need to be adjusted to accommodate new INR goal   Carrie [name] A. Levada Dy, PharmD, [place] Please utilize Amion for appropriate phone number to reach the unit pharmacist ([facility])

## 2019-03-08 NOTE — Discharge Summary (Signed)
Physician Discharge Summary  Patient ID: Carrie [name] MRN: [id number] DOB/AGE: Mar 16, 1941 78 y.o.  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)   Embolic stroke (HCC)   Acute on chronic anemia   New onset of headaches due to bleed   Discharged Condition: stable   Significant Diagnostic Studies: N/A   Labs:  Basic Metabolic Panel: BMP Latest Ref Rng & Units 03/07/2019 02/27/2019 02/25/2019  Glucose 70 - 99 mg/dL 119(H) 128(H) 96  BUN 8 - 23 mg/dL 32(H) 40(H) 29(H)  Creatinine 0.44 - 1.00 mg/dL 1.40(H) 1.44(H) 1.14(H)  BUN/Creat Ratio 12 - 28 - - -  Sodium 135 - 145 mmol/L 134(L) 131(L) 135  Potassium 3.5 - 5.1 mmol/L 3.6 3.6 3.3(L)  Chloride 98 - 111 mmol/L 103 99 99  CO2 22 - 32 mmol/L 22 22 27   Calcium 8.9 - 10.3 mg/dL 9.2 8.8(L) 9.4    CBC: CBC Latest Ref Rng & Units 03/07/2019 02/27/2019 02/26/2019  WBC 4.0 - 10.5 K/uL 5.1 5.5 5.7  Hemoglobin 12.0 - 15.0 g/dL 11.7(L) 8.5(L) 8.0(L)  Hematocrit 36.0 - 46.0 % 34.7(L) 24.8(L) 23.7(L)  Platelets 150 - 400 K/uL 192 174 180   Lab Results  Component Value Date   INR 2.5 (H) 03/07/2019   INR 2.2 (H) 03/05/2019   INR 1.8 (H) 03/04/2019   CBG: No results for input(s): GLUCAP in the last 168 hours.  Brief HPI:   Carrie [name] is a 78 year old female with history of CAF,CAD, 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 CTA done revealing acute occlusion of right M1 branch and she underwent cerebral angio with complete revascularization of R-MCA M1 segment. Dr Erlinda Hong felt that stroke was cardioembolic due to subtherapeutic INR and she was started on IV  heparin.   Hospital course significant of acute on chronic CHF and she was treated with IV diuresis. She developed acute on chronic anemia with steady decline in H/H. She developed N/V with hematemesis and HA. Stat CT head done revealing right SDH and paramedian SAH and heparin reversed. Pulmonary felt that hemoptysis was due to pneumonitis from bleeding and anticoagulation--to check CT chest if bleeding recurred. Follow up CT showed improvement and coumadin/ASA resumed with INR goal 2.0- 2.5. GI symptoms had resolved but she continued to be limited by balance deficits and weakness affecting ADLs and mobility. CIR recommended due to functional decline.   Hospital Course: Carrie [name] was admitted to rehab 02/24/2019 for inpatient therapies to consist of PT, ST and OT at least three hours five days a week. Past admission physiatrist, therapy team and rehab RN have worked together to provide customized collaborative inpatient rehab. She continued to report persistent HA and was started on Topamax with improvement in symptoms. Follow up CBC showed that ABLA is improving. She continues on low dose ASA and continue on coumadin with new goal rate of 2.0 - 2.5 range. Coumadin is therapeutic at 2.5.    Blood pressure and HR have been monitored on tid basis and have been controlled.  CHF compensated on current regimen of Lasix, spironolactone and lopressor.  IV Protonix was change to  oral route and no GI symptoms reported. PO intake has been good and  SCr is up to 1.4 . Respiratory status is stable and oxygen has been weaned off.  She has been making steady progress and is at supervision to min assist level. She will continue to receive follow up [place], [place] and [place] [facility] [place] after discharge.    Rehab course: During patient's stay in rehab weekly team conferences were held to monitor patient's progress, set goals and discuss barriers to discharge. At admission, patient required min to max assist with ADLs  and min assist with mobility. Her husband  that patient required cognitive assistance PTA and currently she displayed delayed responses with decrease in processing and recall. She  has had improvement in activity tolerance, balance, postural control as well as ability to compensate for deficits.  She requires supervision with UB dressing and toileting and min assist with LB dressing.   She continues to require increased time to complete all functional BADL tasks.  She requires min assist with mobility.  She requires mod assist with all cognitive tasks and her husband reported that this was her baseline during family ed. He will continue to provide all physical and cognitive assistance as needed past discharge.    Disposition:  Home  Diet: Heart Healthy.  Special Instructions: 1. New INR goal of 2- 2.5 range due to SDH. 2. HHRN to draw protime on 06/19 with results to [facility].    Discharge Instructions    Ambulatory referral to Physical Medicine Rehab   Complete by: As directed    1-2 weeks transitional care appt     Allergies as of 03/08/2019      Reactions   Codeine Nausea And Vomiting      Medication List    STOP taking these medications   Biotin 1000 MCG tablet   bisacodyl 10 MG suppository Commonly known as: DULCOLAX   feeding supplement (ENSURE ENLIVE) Liqd   Fish Oil 1000 MG Caps   morphine 2 MG/ML injection   ondansetron 4 MG/2ML Soln injection Commonly known as: ZOFRAN   pantoprazole 40 MG injection Commonly known as: PROTONIX Replaced by: pantoprazole 40 MG tablet   senna-docusate 8.6-50 MG tablet Commonly known as: Senokot-S     TAKE these medications   acetaminophen 325 MG tablet Commonly known as: TYLENOL Take 1-2 tablets (325-650 mg total) by mouth every 4 (four) hours as needed for mild pain.   albuterol (2.5 MG/3ML) 0.083% nebulizer solution Commonly known as: PROVENTIL Take 3 mLs (2.5 mg total) by nebulization every 6 (six) hours  as needed for wheezing or shortness of breath.   aspirin 81 MG EC tablet Take 1 tablet (81 mg total) by mouth daily.   docusate 50 MG/5ML liquid Commonly known as: COLACE Take 10 mLs (100 mg total) by mouth 2 (two) times daily as needed for mild constipation. Notes to patient: Over the counter for constipation   furosemide 80 MG tablet Commonly known as: LASIX Take 1 tablet (80 mg total) by mouth 2 (two) times daily.   guaiFENesin 100 MG/5ML Soln Commonly known as: ROBITUSSIN Take 5 mLs (100 mg total) by mouth every 6 (six) hours. Notes to patient: Over the counter   metoprolol tartrate 25 MG tablet Commonly known as: LOPRESSOR Take 1 tablet (25 mg total) by mouth 2 (two) times daily.   MULTIVITAMINS PO Take 1 tablet by mouth daily.   nitroGLYCERIN 0.4 MG SL tablet Commonly known as: NITROSTAT Place  1 tablet (0.4 mg total) under the tongue every 5 (five) minutes as needed for chest pain.   oxybutynin 5 MG tablet Commonly known as: DITROPAN Take 0.5 tablets (2.5 mg total) by mouth at bedtime.   pantoprazole 40 MG tablet Commonly known as: PROTONIX Take 1 tablet (40 mg total) by mouth daily at 12 noon. Replaces: pantoprazole 40 MG injection   polyvinyl alcohol 1.4 % ophthalmic solution Commonly known as: LIQUIFILM TEARS Place 1 drop into the left eye as needed for dry eyes.   spironolactone 25 MG tablet Commonly known as: ALDACTONE Take 0.5 tablets (12.5 mg total) by mouth daily.   topiramate 25 MG tablet Commonly known as: TOPAMAX Take 1 tablet (25 mg total) by mouth 2 (two) times daily.   warfarin 2.5 MG tablet Commonly known as: COUMADIN Take as directed. If you are unsure how to take this medication, talk to your nurse or doctor. Original instructions: Take 1 tablet (2.5 mg total) by mouth daily at 6 PM. What changed:   medication strength  how much to take  how to take this  when to take this  additional instructions  Another medication with the  same name was removed. Continue taking this medication, and follow the directions you see here.      Follow-up Information    Lucianne Lei, MD. Go on 03/14/2019.   Specialty: Family Medicine Why: @ 2:30 PM Contact information: [address] [place] [address] [phone]        Croitoru, Dani Gobble, MD. Call in 1 day(s).   Specialty: Cardiology Why: to set appointment for follow up in 2-3 weeks Contact information: [address] [place] [address] [phone]        [facility]. Call on 03/10/2019.   Why: for follow up appointment if you have not heard from them.  Contact information: [address]     [address] [place] [address] [phone]       Charlett Blake, MD Follow up.   Specialty: Physical Medicine and Rehabilitation Why: Office will call you with follow up appointment Contact information: [place] [address] [phone]           Signed: Bary Leriche 03/08/2019, 11:28 AM

## 2019-03-09 NOTE — Discharge Instructions (Signed)
Inpatient Rehab Discharge Instructions  Carrie [name] Discharge date and time: 03/08/19    Activities/Precautions/ Functional Status: Activity: no lifting, driving, or strenuous exercise ill cleared by MD Diet: cardiac diet Wound Care: none needed    Functional status:  ___ No restrictions     ___ Walk up steps independently _X__ 24/7 supervision/assistance   ___ Walk up steps with assistance ___ Intermittent supervision/assistance  ___ Bathe/dress independently ___ Walk with walker     _X__ Bathe/dress with assistance ___ Walk Independently    ___ Shower independently ___ Walk with assistance    ___ Shower with assistance _X__ No alcohol     ___ Return to work/school ________  COMMUNITY REFERRALS UPON DISCHARGE:   Home Health:   PT     OT     RN     Agency:  [facility] Phone:  [phone] Medical Equipment/Items Ordered:  Transport chair; 3-in-1  Agency/Supplier:  [employer]       Phone:  [phone]  GENERAL COMMUNITY RESOURCES FOR PATIENT/FAMILY: Support Groups:  [facility] Stroke Support Group                              Meets the second Thursday of each month from 6 - 7 PM (except June, July, August)                              [facility], [address], [place]                              For more information, call [phone]  Special Instructions: 1. HHRN to draw protime on 06/19 with results to [facility].  2. Husband to assist with medication management.    My questions have been answered and I understand these instructions. I will adhere to these goals and the provided educational materials after my discharge from the hospital.  Patient/Caregiver Signature _______________________________ Date __________  Clinician Signature _______________________________________ Date __________  Please bring this form and your medication list with you to all your follow-up  doctor's appointments.

## 2019-03-09 NOTE — Progress Notes (Signed)
Social Work Discharge Note  The overall goal for the admission was met for:   Discharge location: Yes - home with husband  Length of Stay: Yes - 12 days  Discharge activity level: Yes - supervision to min A  Home/community participation: Yes  Services provided included: MD, RD, PT, OT, SLP, RN, Pharmacy and SW  Financial Services: Private Insurance: [employer]  Follow-up services arranged: Home Health: PT/OT/RN from [facility], DME: Transport chair and 3-in-1 from [employer] and Patient/Family has no preference for HH/DME agencies  Comments (or additional information): Pt's husband came in for multiple family education sessions and he feels prepared to care for pt at home.  Patient/Family verbalized understanding of follow-up arrangements: Yes  Individual responsible for coordination of the follow-up plan: pt's husband, Corlette Ciano [phone] (h); [phone] (m)  Confirmed correct DME delivered: Trey Sailors 03/09/2019    Sahib Pella, Silvestre Mesi

## 2019-03-10 DIAGNOSIS — I2510 Atherosclerotic heart disease of native coronary artery without angina pectoris: Secondary | ICD-10-CM | POA: Diagnosis not present

## 2019-03-10 DIAGNOSIS — M109 Gout, unspecified: Secondary | ICD-10-CM | POA: Diagnosis not present

## 2019-03-10 DIAGNOSIS — I4821 Permanent atrial fibrillation: Secondary | ICD-10-CM | POA: Diagnosis not present

## 2019-03-10 DIAGNOSIS — N183 Chronic kidney disease, stage 3 (moderate): Secondary | ICD-10-CM | POA: Diagnosis not present

## 2019-03-10 DIAGNOSIS — Z950 Presence of cardiac pacemaker: Secondary | ICD-10-CM | POA: Diagnosis not present

## 2019-03-10 DIAGNOSIS — I130 Hypertensive heart and chronic kidney disease with heart failure and stage 1 through stage 4 chronic kidney disease, or unspecified chronic kidney disease: Secondary | ICD-10-CM | POA: Diagnosis not present

## 2019-03-10 DIAGNOSIS — I5022 Chronic systolic (congestive) heart failure: Secondary | ICD-10-CM | POA: Diagnosis not present

## 2019-03-10 DIAGNOSIS — I495 Sick sinus syndrome: Secondary | ICD-10-CM | POA: Diagnosis not present

## 2019-03-10 DIAGNOSIS — K219 Gastro-esophageal reflux disease without esophagitis: Secondary | ICD-10-CM | POA: Diagnosis not present

## 2019-03-10 DIAGNOSIS — Z955 Presence of coronary angioplasty implant and graft: Secondary | ICD-10-CM | POA: Diagnosis not present

## 2019-03-10 DIAGNOSIS — Z952 Presence of prosthetic heart valve: Secondary | ICD-10-CM | POA: Diagnosis not present

## 2019-03-10 DIAGNOSIS — I4892 Unspecified atrial flutter: Secondary | ICD-10-CM | POA: Diagnosis not present

## 2019-03-10 DIAGNOSIS — I272 Pulmonary hypertension, unspecified: Secondary | ICD-10-CM | POA: Diagnosis not present

## 2019-03-10 DIAGNOSIS — D631 Anemia in chronic kidney disease: Secondary | ICD-10-CM | POA: Diagnosis not present

## 2019-03-10 DIAGNOSIS — M1990 Unspecified osteoarthritis, unspecified site: Secondary | ICD-10-CM | POA: Diagnosis not present

## 2019-03-10 DIAGNOSIS — Z7901 Long term (current) use of anticoagulants: Secondary | ICD-10-CM | POA: Diagnosis not present

## 2019-03-10 DIAGNOSIS — I50810 Right heart failure, unspecified: Secondary | ICD-10-CM | POA: Diagnosis not present

## 2019-03-10 DIAGNOSIS — E7800 Pure hypercholesterolemia, unspecified: Secondary | ICD-10-CM | POA: Diagnosis not present

## 2019-03-10 DIAGNOSIS — Z8673 Personal history of transient ischemic attack (TIA), and cerebral infarction without residual deficits: Secondary | ICD-10-CM | POA: Diagnosis not present

## 2019-03-10 DIAGNOSIS — I252 Old myocardial infarction: Secondary | ICD-10-CM | POA: Diagnosis not present

## 2019-03-10 DIAGNOSIS — I082 Rheumatic disorders of both aortic and tricuspid valves: Secondary | ICD-10-CM | POA: Diagnosis not present

## 2019-03-10 DIAGNOSIS — I5033 Acute on chronic diastolic (congestive) heart failure: Secondary | ICD-10-CM | POA: Diagnosis not present

## 2019-03-12 DIAGNOSIS — I5033 Acute on chronic diastolic (congestive) heart failure: Secondary | ICD-10-CM | POA: Diagnosis not present

## 2019-03-12 DIAGNOSIS — Z7901 Long term (current) use of anticoagulants: Secondary | ICD-10-CM | POA: Diagnosis not present

## 2019-03-12 DIAGNOSIS — N183 Chronic kidney disease, stage 3 (moderate): Secondary | ICD-10-CM | POA: Diagnosis not present

## 2019-03-12 DIAGNOSIS — K219 Gastro-esophageal reflux disease without esophagitis: Secondary | ICD-10-CM | POA: Diagnosis not present

## 2019-03-12 DIAGNOSIS — I082 Rheumatic disorders of both aortic and tricuspid valves: Secondary | ICD-10-CM | POA: Diagnosis not present

## 2019-03-12 DIAGNOSIS — M109 Gout, unspecified: Secondary | ICD-10-CM | POA: Diagnosis not present

## 2019-03-12 DIAGNOSIS — Z8673 Personal history of transient ischemic attack (TIA), and cerebral infarction without residual deficits: Secondary | ICD-10-CM | POA: Diagnosis not present

## 2019-03-12 DIAGNOSIS — Z950 Presence of cardiac pacemaker: Secondary | ICD-10-CM | POA: Diagnosis not present

## 2019-03-12 DIAGNOSIS — D631 Anemia in chronic kidney disease: Secondary | ICD-10-CM | POA: Diagnosis not present

## 2019-03-12 DIAGNOSIS — I4821 Permanent atrial fibrillation: Secondary | ICD-10-CM | POA: Diagnosis not present

## 2019-03-12 DIAGNOSIS — Z952 Presence of prosthetic heart valve: Secondary | ICD-10-CM | POA: Diagnosis not present

## 2019-03-12 DIAGNOSIS — E7800 Pure hypercholesterolemia, unspecified: Secondary | ICD-10-CM | POA: Diagnosis not present

## 2019-03-12 DIAGNOSIS — I252 Old myocardial infarction: Secondary | ICD-10-CM | POA: Diagnosis not present

## 2019-03-12 DIAGNOSIS — I4892 Unspecified atrial flutter: Secondary | ICD-10-CM | POA: Diagnosis not present

## 2019-03-12 DIAGNOSIS — I5022 Chronic systolic (congestive) heart failure: Secondary | ICD-10-CM | POA: Diagnosis not present

## 2019-03-12 DIAGNOSIS — I272 Pulmonary hypertension, unspecified: Secondary | ICD-10-CM | POA: Diagnosis not present

## 2019-03-12 DIAGNOSIS — I130 Hypertensive heart and chronic kidney disease with heart failure and stage 1 through stage 4 chronic kidney disease, or unspecified chronic kidney disease: Secondary | ICD-10-CM | POA: Diagnosis not present

## 2019-03-12 DIAGNOSIS — I2510 Atherosclerotic heart disease of native coronary artery without angina pectoris: Secondary | ICD-10-CM | POA: Diagnosis not present

## 2019-03-12 DIAGNOSIS — I495 Sick sinus syndrome: Secondary | ICD-10-CM | POA: Diagnosis not present

## 2019-03-12 DIAGNOSIS — M1990 Unspecified osteoarthritis, unspecified site: Secondary | ICD-10-CM | POA: Diagnosis not present

## 2019-03-12 DIAGNOSIS — I50810 Right heart failure, unspecified: Secondary | ICD-10-CM | POA: Diagnosis not present

## 2019-03-12 DIAGNOSIS — Z955 Presence of coronary angioplasty implant and graft: Secondary | ICD-10-CM | POA: Diagnosis not present

## 2019-03-13 ENCOUNTER — Telehealth: Payer: Self-pay | Admitting: *Deleted

## 2019-03-13 DIAGNOSIS — I4821 Permanent atrial fibrillation: Secondary | ICD-10-CM | POA: Diagnosis not present

## 2019-03-13 DIAGNOSIS — I50810 Right heart failure, unspecified: Secondary | ICD-10-CM | POA: Diagnosis not present

## 2019-03-13 DIAGNOSIS — I272 Pulmonary hypertension, unspecified: Secondary | ICD-10-CM | POA: Diagnosis not present

## 2019-03-13 DIAGNOSIS — I5033 Acute on chronic diastolic (congestive) heart failure: Secondary | ICD-10-CM | POA: Diagnosis not present

## 2019-03-13 DIAGNOSIS — I5022 Chronic systolic (congestive) heart failure: Secondary | ICD-10-CM | POA: Diagnosis not present

## 2019-03-13 DIAGNOSIS — N183 Chronic kidney disease, stage 3 (moderate): Secondary | ICD-10-CM | POA: Diagnosis not present

## 2019-03-13 DIAGNOSIS — I495 Sick sinus syndrome: Secondary | ICD-10-CM | POA: Diagnosis not present

## 2019-03-13 DIAGNOSIS — M1990 Unspecified osteoarthritis, unspecified site: Secondary | ICD-10-CM | POA: Diagnosis not present

## 2019-03-13 DIAGNOSIS — I130 Hypertensive heart and chronic kidney disease with heart failure and stage 1 through stage 4 chronic kidney disease, or unspecified chronic kidney disease: Secondary | ICD-10-CM | POA: Diagnosis not present

## 2019-03-13 DIAGNOSIS — Z8673 Personal history of transient ischemic attack (TIA), and cerebral infarction without residual deficits: Secondary | ICD-10-CM | POA: Diagnosis not present

## 2019-03-13 DIAGNOSIS — I082 Rheumatic disorders of both aortic and tricuspid valves: Secondary | ICD-10-CM | POA: Diagnosis not present

## 2019-03-13 DIAGNOSIS — I2510 Atherosclerotic heart disease of native coronary artery without angina pectoris: Secondary | ICD-10-CM | POA: Diagnosis not present

## 2019-03-13 DIAGNOSIS — D631 Anemia in chronic kidney disease: Secondary | ICD-10-CM | POA: Diagnosis not present

## 2019-03-13 DIAGNOSIS — I4892 Unspecified atrial flutter: Secondary | ICD-10-CM | POA: Diagnosis not present

## 2019-03-13 DIAGNOSIS — Z955 Presence of coronary angioplasty implant and graft: Secondary | ICD-10-CM | POA: Diagnosis not present

## 2019-03-13 DIAGNOSIS — Z7901 Long term (current) use of anticoagulants: Secondary | ICD-10-CM | POA: Diagnosis not present

## 2019-03-13 DIAGNOSIS — M109 Gout, unspecified: Secondary | ICD-10-CM | POA: Diagnosis not present

## 2019-03-13 DIAGNOSIS — K219 Gastro-esophageal reflux disease without esophagitis: Secondary | ICD-10-CM | POA: Diagnosis not present

## 2019-03-13 DIAGNOSIS — I252 Old myocardial infarction: Secondary | ICD-10-CM | POA: Diagnosis not present

## 2019-03-13 DIAGNOSIS — E7800 Pure hypercholesterolemia, unspecified: Secondary | ICD-10-CM | POA: Diagnosis not present

## 2019-03-13 DIAGNOSIS — Z950 Presence of cardiac pacemaker: Secondary | ICD-10-CM | POA: Diagnosis not present

## 2019-03-13 DIAGNOSIS — Z952 Presence of prosthetic heart valve: Secondary | ICD-10-CM | POA: Diagnosis not present

## 2019-03-13 NOTE — Telephone Encounter (Signed)
Erlene Quan OT called for 2wk4 POC.  Approval given.

## 2019-03-14 DIAGNOSIS — I519 Heart disease, unspecified: Secondary | ICD-10-CM | POA: Diagnosis not present

## 2019-03-14 DIAGNOSIS — Z8673 Personal history of transient ischemic attack (TIA), and cerebral infarction without residual deficits: Secondary | ICD-10-CM | POA: Diagnosis not present

## 2019-03-16 ENCOUNTER — Telehealth: Payer: Self-pay | Admitting: Cardiovascular Disease

## 2019-03-16 ENCOUNTER — Ambulatory Visit (INDEPENDENT_AMBULATORY_CARE_PROVIDER_SITE_OTHER): Payer: Medicare Other | Admitting: Internal Medicine

## 2019-03-16 DIAGNOSIS — Z955 Presence of coronary angioplasty implant and graft: Secondary | ICD-10-CM | POA: Diagnosis not present

## 2019-03-16 DIAGNOSIS — I5033 Acute on chronic diastolic (congestive) heart failure: Secondary | ICD-10-CM | POA: Diagnosis not present

## 2019-03-16 DIAGNOSIS — Z952 Presence of prosthetic heart valve: Secondary | ICD-10-CM | POA: Diagnosis not present

## 2019-03-16 DIAGNOSIS — E7800 Pure hypercholesterolemia, unspecified: Secondary | ICD-10-CM | POA: Diagnosis not present

## 2019-03-16 DIAGNOSIS — I50810 Right heart failure, unspecified: Secondary | ICD-10-CM | POA: Diagnosis not present

## 2019-03-16 DIAGNOSIS — K219 Gastro-esophageal reflux disease without esophagitis: Secondary | ICD-10-CM | POA: Diagnosis not present

## 2019-03-16 DIAGNOSIS — N183 Chronic kidney disease, stage 3 (moderate): Secondary | ICD-10-CM | POA: Diagnosis not present

## 2019-03-16 DIAGNOSIS — I4821 Permanent atrial fibrillation: Secondary | ICD-10-CM | POA: Diagnosis not present

## 2019-03-16 DIAGNOSIS — I4892 Unspecified atrial flutter: Secondary | ICD-10-CM | POA: Diagnosis not present

## 2019-03-16 DIAGNOSIS — I130 Hypertensive heart and chronic kidney disease with heart failure and stage 1 through stage 4 chronic kidney disease, or unspecified chronic kidney disease: Secondary | ICD-10-CM | POA: Diagnosis not present

## 2019-03-16 DIAGNOSIS — Z8673 Personal history of transient ischemic attack (TIA), and cerebral infarction without residual deficits: Secondary | ICD-10-CM | POA: Diagnosis not present

## 2019-03-16 DIAGNOSIS — Z950 Presence of cardiac pacemaker: Secondary | ICD-10-CM | POA: Diagnosis not present

## 2019-03-16 DIAGNOSIS — D631 Anemia in chronic kidney disease: Secondary | ICD-10-CM | POA: Diagnosis not present

## 2019-03-16 DIAGNOSIS — I2510 Atherosclerotic heart disease of native coronary artery without angina pectoris: Secondary | ICD-10-CM | POA: Diagnosis not present

## 2019-03-16 DIAGNOSIS — Z7901 Long term (current) use of anticoagulants: Secondary | ICD-10-CM

## 2019-03-16 DIAGNOSIS — I082 Rheumatic disorders of both aortic and tricuspid valves: Secondary | ICD-10-CM | POA: Diagnosis not present

## 2019-03-16 DIAGNOSIS — M1990 Unspecified osteoarthritis, unspecified site: Secondary | ICD-10-CM | POA: Diagnosis not present

## 2019-03-16 DIAGNOSIS — I272 Pulmonary hypertension, unspecified: Secondary | ICD-10-CM | POA: Diagnosis not present

## 2019-03-16 DIAGNOSIS — I5022 Chronic systolic (congestive) heart failure: Secondary | ICD-10-CM | POA: Diagnosis not present

## 2019-03-16 DIAGNOSIS — M109 Gout, unspecified: Secondary | ICD-10-CM | POA: Diagnosis not present

## 2019-03-16 DIAGNOSIS — I495 Sick sinus syndrome: Secondary | ICD-10-CM | POA: Diagnosis not present

## 2019-03-16 DIAGNOSIS — I252 Old myocardial infarction: Secondary | ICD-10-CM | POA: Diagnosis not present

## 2019-03-16 LAB — POCT INR: INR: 1.5 — AB (ref 2.0–3.0)

## 2019-03-16 NOTE — Telephone Encounter (Signed)
°  Home Health Nurse reports Edema +3.    Pt c/o swelling: STAT is pt has developed SOB within 24 hours  1) How much weight have you gained and in what time span? 2 pounds  2) If swelling, where is the swelling located? Feet and legs  3) Are you currently taking a fluid pill? yes  4) Are you currently SOB? no  5) Do you have a log of your daily weights (if so, list)? no  6) Have you gained 3 pounds in a day or 5 pounds in a week? no  7) Have you traveled recently? no    Home Health Nurse needs clarification on two medications:   1. Name of Medication:  atorvastatin 40mg   Potassium 20 meq  2. How are you currently taking this medication (dosage and times per day)?Patient is not taking.   3. Are you having a reaction (difficulty breathing--STAT)? no  4. What is your medication issue? She has a bottle of both of the medications in her possession. The bottle of atorvastatin states to take 1x daily, and the potassium states to take 2 tablets in the morning and 1 in the afternoon (total of 3x daily)  Husband reports that the patient is NOT taking this medication, This medication is not on her discharge list nor her list of current medications. The home health nurse just wants to clarify that the patient is NOT supposed to be taking those   Home Health Nurse can do labs to check BMETvia a verbal order  if necessary

## 2019-03-16 NOTE — Telephone Encounter (Signed)
Needs F/U next week with an APP to evaluate swelling

## 2019-03-16 NOTE — Telephone Encounter (Signed)
Spoke with home health nurse Amy- she states that Carrie [name] was discharged from hospital last week due to a stroke- she went today to check her PT/INR and noticed swelling in her legs. It was +3 edema noted from knee down. Very tight and swollen. They did not keep a current weight log, but she weighed her today and she was 122.6. pt denies being SOB or having CP, her O2 states were normal even when standing to do weight was 96%. Home health nurse did educate on proper elevation, and to start a weight log. She states she had some sort of stockings on but they were cutting into her skin so they took them off. Nurse states she does not have any fluid sounds in her lungs at this time. But she is wondering if we should get a verbal order to do a BMET as she can draw it for her at her home.   Nurse also states patient has Atorvastatin and Potassium in the home but not taking them and wants to make sure this is accurate.   Patient was also educated on how to take lasix as she was taking the second dose at 7:00 PM and not sleeping due to having to use the restroom. She now takes AM dose, and second dose at 3:00 PM. Patient also on Spironolactone 0.5 tablet as well.   Advised would route to MD and nurse.

## 2019-03-20 ENCOUNTER — Encounter (HOSPITAL_COMMUNITY): Payer: Self-pay | Admitting: Certified Registered"

## 2019-03-20 ENCOUNTER — Inpatient Hospital Stay (HOSPITAL_COMMUNITY)
Admission: EM | Admit: 2019-03-20 | Discharge: 2019-03-28 | DRG: 510 | Disposition: A | Discharge status: Skilled Nursing Facility | Payer: Medicare Other | Attending: Family Medicine | Admitting: Family Medicine

## 2019-03-20 ENCOUNTER — Other Ambulatory Visit: Payer: Self-pay

## 2019-03-20 ENCOUNTER — Emergency Department (HOSPITAL_COMMUNITY): Payer: Medicare Other

## 2019-03-20 ENCOUNTER — Inpatient Hospital Stay (HOSPITAL_COMMUNITY): Payer: Medicare Other

## 2019-03-20 ENCOUNTER — Encounter (HOSPITAL_COMMUNITY): Payer: Self-pay | Admitting: Emergency Medicine

## 2019-03-20 DIAGNOSIS — I4891 Unspecified atrial fibrillation: Secondary | ICD-10-CM | POA: Diagnosis present

## 2019-03-20 DIAGNOSIS — Z1159 Encounter for screening for other viral diseases: Secondary | ICD-10-CM | POA: Diagnosis not present

## 2019-03-20 DIAGNOSIS — S299XXA Unspecified injury of thorax, initial encounter: Secondary | ICD-10-CM | POA: Diagnosis not present

## 2019-03-20 DIAGNOSIS — Z9842 Cataract extraction status, left eye: Secondary | ICD-10-CM

## 2019-03-20 DIAGNOSIS — I639 Cerebral infarction, unspecified: Secondary | ICD-10-CM | POA: Diagnosis not present

## 2019-03-20 DIAGNOSIS — M19011 Primary osteoarthritis, right shoulder: Secondary | ICD-10-CM | POA: Diagnosis present

## 2019-03-20 DIAGNOSIS — S62109A Fracture of unspecified carpal bone, unspecified wrist, initial encounter for closed fracture: Secondary | ICD-10-CM

## 2019-03-20 DIAGNOSIS — K219 Gastro-esophageal reflux disease without esophagitis: Secondary | ICD-10-CM | POA: Diagnosis present

## 2019-03-20 DIAGNOSIS — W19XXXD Unspecified fall, subsequent encounter: Secondary | ICD-10-CM | POA: Diagnosis not present

## 2019-03-20 DIAGNOSIS — Z8249 Family history of ischemic heart disease and other diseases of the circulatory system: Secondary | ICD-10-CM

## 2019-03-20 DIAGNOSIS — Z7982 Long term (current) use of aspirin: Secondary | ICD-10-CM

## 2019-03-20 DIAGNOSIS — I5032 Chronic diastolic (congestive) heart failure: Secondary | ICD-10-CM | POA: Diagnosis present

## 2019-03-20 DIAGNOSIS — F0280 Dementia in other diseases classified elsewhere without behavioral disturbance: Secondary | ICD-10-CM | POA: Diagnosis present

## 2019-03-20 DIAGNOSIS — S199XXA Unspecified injury of neck, initial encounter: Secondary | ICD-10-CM | POA: Diagnosis not present

## 2019-03-20 DIAGNOSIS — I130 Hypertensive heart and chronic kidney disease with heart failure and stage 1 through stage 4 chronic kidney disease, or unspecified chronic kidney disease: Secondary | ICD-10-CM | POA: Diagnosis not present

## 2019-03-20 DIAGNOSIS — I2721 Secondary pulmonary arterial hypertension: Secondary | ICD-10-CM | POA: Diagnosis not present

## 2019-03-20 DIAGNOSIS — S62101A Fracture of unspecified carpal bone, right wrist, initial encounter for closed fracture: Secondary | ICD-10-CM | POA: Diagnosis not present

## 2019-03-20 DIAGNOSIS — I110 Hypertensive heart disease with heart failure: Secondary | ICD-10-CM | POA: Diagnosis present

## 2019-03-20 DIAGNOSIS — S52571A Other intraarticular fracture of lower end of right radius, initial encounter for closed fracture: Principal | ICD-10-CM | POA: Diagnosis present

## 2019-03-20 DIAGNOSIS — N183 Chronic kidney disease, stage 3 unspecified: Secondary | ICD-10-CM | POA: Diagnosis present

## 2019-03-20 DIAGNOSIS — Z7901 Long term (current) use of anticoagulants: Secondary | ICD-10-CM | POA: Diagnosis not present

## 2019-03-20 DIAGNOSIS — S52601A Unspecified fracture of lower end of right ulna, initial encounter for closed fracture: Secondary | ICD-10-CM

## 2019-03-20 DIAGNOSIS — M25531 Pain in right wrist: Secondary | ICD-10-CM

## 2019-03-20 DIAGNOSIS — Z950 Presence of cardiac pacemaker: Secondary | ICD-10-CM | POA: Diagnosis present

## 2019-03-20 DIAGNOSIS — Z9841 Cataract extraction status, right eye: Secondary | ICD-10-CM

## 2019-03-20 DIAGNOSIS — I252 Old myocardial infarction: Secondary | ICD-10-CM | POA: Diagnosis not present

## 2019-03-20 DIAGNOSIS — W19XXXA Unspecified fall, initial encounter: Secondary | ICD-10-CM | POA: Diagnosis not present

## 2019-03-20 DIAGNOSIS — S52611A Displaced fracture of right ulna styloid process, initial encounter for closed fracture: Secondary | ICD-10-CM | POA: Diagnosis not present

## 2019-03-20 DIAGNOSIS — S52531A Colles' fracture of right radius, initial encounter for closed fracture: Secondary | ICD-10-CM | POA: Diagnosis not present

## 2019-03-20 DIAGNOSIS — I214 Non-ST elevation (NSTEMI) myocardial infarction: Secondary | ICD-10-CM | POA: Diagnosis present

## 2019-03-20 DIAGNOSIS — F0390 Unspecified dementia without behavioral disturbance: Secondary | ICD-10-CM | POA: Diagnosis present

## 2019-03-20 DIAGNOSIS — I2510 Atherosclerotic heart disease of native coronary artery without angina pectoris: Secondary | ICD-10-CM | POA: Diagnosis not present

## 2019-03-20 DIAGNOSIS — R718 Other abnormality of red blood cells: Secondary | ICD-10-CM | POA: Diagnosis not present

## 2019-03-20 DIAGNOSIS — I5042 Chronic combined systolic (congestive) and diastolic (congestive) heart failure: Secondary | ICD-10-CM | POA: Diagnosis not present

## 2019-03-20 DIAGNOSIS — Z8673 Personal history of transient ischemic attack (TIA), and cerebral infarction without residual deficits: Secondary | ICD-10-CM

## 2019-03-20 DIAGNOSIS — Y92002 Bathroom of unspecified non-institutional (private) residence single-family (private) house as the place of occurrence of the external cause: Secondary | ICD-10-CM | POA: Diagnosis not present

## 2019-03-20 DIAGNOSIS — Z806 Family history of leukemia: Secondary | ICD-10-CM

## 2019-03-20 DIAGNOSIS — I495 Sick sinus syndrome: Secondary | ICD-10-CM | POA: Diagnosis not present

## 2019-03-20 DIAGNOSIS — N390 Urinary tract infection, site not specified: Secondary | ICD-10-CM | POA: Diagnosis present

## 2019-03-20 DIAGNOSIS — W010XXA Fall on same level from slipping, tripping and stumbling without subsequent striking against object, initial encounter: Secondary | ICD-10-CM | POA: Diagnosis present

## 2019-03-20 DIAGNOSIS — S52611D Displaced fracture of right ulna styloid process, subsequent encounter for closed fracture with routine healing: Secondary | ICD-10-CM | POA: Diagnosis not present

## 2019-03-20 DIAGNOSIS — S065X0A Traumatic subdural hemorrhage without loss of consciousness, initial encounter: Secondary | ICD-10-CM | POA: Diagnosis not present

## 2019-03-20 DIAGNOSIS — I69398 Other sequelae of cerebral infarction: Secondary | ICD-10-CM | POA: Diagnosis not present

## 2019-03-20 DIAGNOSIS — T45511A Poisoning by anticoagulants, accidental (unintentional), initial encounter: Secondary | ICD-10-CM | POA: Diagnosis not present

## 2019-03-20 DIAGNOSIS — I63411 Cerebral infarction due to embolism of right middle cerebral artery: Secondary | ICD-10-CM | POA: Diagnosis present

## 2019-03-20 DIAGNOSIS — Z79899 Other long term (current) drug therapy: Secondary | ICD-10-CM

## 2019-03-20 DIAGNOSIS — Z4789 Encounter for other orthopedic aftercare: Secondary | ICD-10-CM | POA: Diagnosis not present

## 2019-03-20 DIAGNOSIS — M6281 Muscle weakness (generalized): Secondary | ICD-10-CM | POA: Diagnosis not present

## 2019-03-20 DIAGNOSIS — E785 Hyperlipidemia, unspecified: Secondary | ICD-10-CM | POA: Diagnosis not present

## 2019-03-20 DIAGNOSIS — S52501A Unspecified fracture of the lower end of right radius, initial encounter for closed fracture: Secondary | ICD-10-CM | POA: Diagnosis not present

## 2019-03-20 DIAGNOSIS — F419 Anxiety disorder, unspecified: Secondary | ICD-10-CM | POA: Diagnosis present

## 2019-03-20 DIAGNOSIS — B9620 Unspecified Escherichia coli [E. coli] as the cause of diseases classified elsewhere: Secondary | ICD-10-CM | POA: Diagnosis not present

## 2019-03-20 DIAGNOSIS — Y92230 Patient room in hospital as the place of occurrence of the external cause: Secondary | ICD-10-CM | POA: Diagnosis not present

## 2019-03-20 DIAGNOSIS — S52501D Unspecified fracture of the lower end of right radius, subsequent encounter for closed fracture with routine healing: Secondary | ICD-10-CM | POA: Diagnosis not present

## 2019-03-20 DIAGNOSIS — Z885 Allergy status to narcotic agent status: Secondary | ICD-10-CM

## 2019-03-20 DIAGNOSIS — I480 Paroxysmal atrial fibrillation: Secondary | ICD-10-CM | POA: Diagnosis not present

## 2019-03-20 DIAGNOSIS — I6203 Nontraumatic chronic subdural hemorrhage: Secondary | ICD-10-CM | POA: Diagnosis present

## 2019-03-20 DIAGNOSIS — Z955 Presence of coronary angioplasty implant and graft: Secondary | ICD-10-CM

## 2019-03-20 DIAGNOSIS — Z952 Presence of prosthetic heart valve: Secondary | ICD-10-CM | POA: Diagnosis not present

## 2019-03-20 DIAGNOSIS — Z7401 Bed confinement status: Secondary | ICD-10-CM | POA: Diagnosis not present

## 2019-03-20 DIAGNOSIS — F0150 Vascular dementia without behavioral disturbance: Secondary | ICD-10-CM | POA: Diagnosis not present

## 2019-03-20 DIAGNOSIS — I63511 Cerebral infarction due to unspecified occlusion or stenosis of right middle cerebral artery: Secondary | ICD-10-CM | POA: Diagnosis present

## 2019-03-20 DIAGNOSIS — I4811 Longstanding persistent atrial fibrillation: Secondary | ICD-10-CM | POA: Diagnosis present

## 2019-03-20 DIAGNOSIS — I361 Nonrheumatic tricuspid (valve) insufficiency: Secondary | ICD-10-CM | POA: Diagnosis not present

## 2019-03-20 DIAGNOSIS — Z823 Family history of stroke: Secondary | ICD-10-CM

## 2019-03-20 DIAGNOSIS — I10 Essential (primary) hypertension: Secondary | ICD-10-CM | POA: Diagnosis not present

## 2019-03-20 DIAGNOSIS — R410 Disorientation, unspecified: Secondary | ICD-10-CM | POA: Diagnosis not present

## 2019-03-20 DIAGNOSIS — D696 Thrombocytopenia, unspecified: Secondary | ICD-10-CM | POA: Diagnosis present

## 2019-03-20 DIAGNOSIS — M2550 Pain in unspecified joint: Secondary | ICD-10-CM | POA: Diagnosis not present

## 2019-03-20 DIAGNOSIS — S6291XD Unspecified fracture of right wrist and hand, subsequent encounter for fracture with routine healing: Secondary | ICD-10-CM | POA: Diagnosis not present

## 2019-03-20 DIAGNOSIS — Z961 Presence of intraocular lens: Secondary | ICD-10-CM | POA: Diagnosis present

## 2019-03-20 DIAGNOSIS — Z833 Family history of diabetes mellitus: Secondary | ICD-10-CM

## 2019-03-20 LAB — CBC WITH DIFFERENTIAL/PLATELET
Abs Immature Granulocytes: 0.02 10*3/uL (ref 0.00–0.07)
Basophils Absolute: 0.1 10*3/uL (ref 0.0–0.1)
Basophils Relative: 1 %
Eosinophils Absolute: 0.2 10*3/uL (ref 0.0–0.5)
Eosinophils Relative: 4 %
HCT: 25.8 % — ABNORMAL LOW (ref 36.0–46.0)
Hemoglobin: 8 g/dL — ABNORMAL LOW (ref 12.0–15.0)
Immature Granulocytes: 0 %
Lymphocytes Relative: 14 %
Lymphs Abs: 0.8 10*3/uL (ref 0.7–4.0)
MCH: 28.8 pg (ref 26.0–34.0)
MCHC: 31 g/dL (ref 30.0–36.0)
MCV: 92.8 fL (ref 80.0–100.0)
Monocytes Absolute: 0.9 10*3/uL (ref 0.1–1.0)
Monocytes Relative: 16 %
Neutro Abs: 3.7 10*3/uL (ref 1.7–7.7)
Neutrophils Relative %: 65 %
Platelets: 188 10*3/uL (ref 150–400)
RBC: 2.78 MIL/uL — ABNORMAL LOW (ref 3.87–5.11)
RDW: 35.1 % — ABNORMAL HIGH (ref 11.5–15.5)
WBC: 5.6 10*3/uL (ref 4.0–10.5)
nRBC: 0.4 % — ABNORMAL HIGH (ref 0.0–0.2)

## 2019-03-20 LAB — URINALYSIS, ROUTINE W REFLEX MICROSCOPIC
Bilirubin Urine: NEGATIVE
Glucose, UA: NEGATIVE mg/dL
Hgb urine dipstick: NEGATIVE
Ketones, ur: NEGATIVE mg/dL
Nitrite: NEGATIVE
Protein, ur: NEGATIVE mg/dL
Specific Gravity, Urine: 1.009 (ref 1.005–1.030)
pH: 7 (ref 5.0–8.0)

## 2019-03-20 LAB — BASIC METABOLIC PANEL
Anion gap: 11 (ref 5–15)
BUN: 38 mg/dL — ABNORMAL HIGH (ref 8–23)
CO2: 24 mmol/L (ref 22–32)
Calcium: 9.9 mg/dL (ref 8.9–10.3)
Chloride: 105 mmol/L (ref 98–111)
Creatinine, Ser: 1.46 mg/dL — ABNORMAL HIGH (ref 0.44–1.00)
GFR calc Af Amer: 40 mL/min — ABNORMAL LOW (ref >60)
GFR calc non Af Amer: 34 mL/min — ABNORMAL LOW (ref >60)
Glucose, Bld: 104 mg/dL — ABNORMAL HIGH (ref 70–99)
Potassium: 4 mmol/L (ref 3.5–5.1)
Sodium: 140 mmol/L (ref 135–145)

## 2019-03-20 LAB — SARS CORONAVIRUS 2 BY RT PCR (HOSPITAL ORDER, PERFORMED IN ~~LOC~~ HOSPITAL LAB): SARS Coronavirus 2: NEGATIVE

## 2019-03-20 LAB — TROPONIN I (HIGH SENSITIVITY)
Troponin I (High Sensitivity): 39 ng/L — ABNORMAL HIGH (ref <18)
Troponin I (High Sensitivity): 40 ng/L — ABNORMAL HIGH (ref <18)

## 2019-03-20 LAB — BRAIN NATRIURETIC PEPTIDE: B Natriuretic Peptide: 2182.8 pg/mL — ABNORMAL HIGH (ref 0.0–100.0)

## 2019-03-20 LAB — PROTIME-INR
INR: 1.8 — ABNORMAL HIGH (ref 0.8–1.2)
Prothrombin Time: 20.8 seconds — ABNORMAL HIGH (ref 11.4–15.2)

## 2019-03-20 MED ORDER — OXYBUTYNIN CHLORIDE 5 MG PO TABS
2.5000 mg | ORAL_TABLET | Freq: Every day | ORAL | Status: DC
  Administered 2019-03-20 – 2019-03-27 (×8): 2.5 mg via ORAL
  Filled 2019-03-20 (×2): qty 0.5
  Filled 2019-03-20: qty 1
  Filled 2019-03-20 (×6): qty 0.5

## 2019-03-20 MED ORDER — MORPHINE SULFATE (PF) 2 MG/ML IV SOLN
2.0000 mg | Freq: Once | INTRAVENOUS | Status: AC
  Administered 2019-03-20: 2 mg via INTRAVENOUS
  Filled 2019-03-20: qty 1

## 2019-03-20 MED ORDER — METOPROLOL TARTRATE 25 MG PO TABS
25.0000 mg | ORAL_TABLET | Freq: Two times a day (BID) | ORAL | Status: DC
  Administered 2019-03-20 – 2019-03-28 (×16): 25 mg via ORAL
  Filled 2019-03-20 (×16): qty 1

## 2019-03-20 MED ORDER — CHLORHEXIDINE GLUCONATE 4 % EX LIQD
60.0000 mL | Freq: Once | CUTANEOUS | Status: DC
  Filled 2019-03-20: qty 60

## 2019-03-20 MED ORDER — LIDOCAINE HCL 1 % IJ SOLN
10.0000 mL | Freq: Once | INTRAMUSCULAR | Status: AC
  Administered 2019-03-20: 16:00:00
  Filled 2019-03-20: qty 10

## 2019-03-20 MED ORDER — TOPIRAMATE 25 MG PO TABS
25.0000 mg | ORAL_TABLET | Freq: Two times a day (BID) | ORAL | Status: DC
  Administered 2019-03-20 – 2019-03-28 (×16): 25 mg via ORAL
  Filled 2019-03-20 (×17): qty 1

## 2019-03-20 MED ORDER — HEPARIN (PORCINE) 25000 UT/250ML-% IV SOLN
750.0000 [IU]/h | INTRAVENOUS | Status: DC
  Filled 2019-03-20: qty 250

## 2019-03-20 MED ORDER — PROTAMINE SULFATE 10 MG/ML IV SOLN
50.0000 mg | INTRAVENOUS | Status: AC
  Administered 2019-03-20: 50 mg via INTRAVENOUS
  Filled 2019-03-20: qty 5

## 2019-03-20 MED ORDER — NITROGLYCERIN 0.4 MG SL SUBL: 0.4000 mg | SUBLINGUAL_TABLET | SUBLINGUAL | Status: DC | PRN

## 2019-03-20 MED ORDER — CEFAZOLIN SODIUM-DEXTROSE 2-4 GM/100ML-% IV SOLN
2.0000 g | INTRAVENOUS | Status: DC
  Filled 2019-03-20: qty 100

## 2019-03-20 MED ORDER — POVIDONE-IODINE 10 % EX SWAB: 2.0000 "application " | Freq: Once | CUTANEOUS | Status: DC

## 2019-03-20 MED ORDER — GUAIFENESIN 100 MG/5ML PO SOLN: 5.0000 mL | Freq: Every evening | ORAL | Status: DC | PRN

## 2019-03-20 MED ORDER — FUROSEMIDE 80 MG PO TABS
80.0000 mg | ORAL_TABLET | Freq: Two times a day (BID) | ORAL | Status: DC
  Administered 2019-03-20 – 2019-03-28 (×14): 80 mg via ORAL
  Filled 2019-03-20: qty 2
  Filled 2019-03-20 (×13): qty 1

## 2019-03-20 MED ORDER — ENSURE PRE-SURGERY PO LIQD
296.0000 mL | Freq: Once | ORAL | Status: DC
  Filled 2019-03-20: qty 296

## 2019-03-20 MED ORDER — LIDOCAINE HCL (PF) 1 % IJ SOLN
INTRAMUSCULAR | Status: AC
  Administered 2019-03-20: 16:00:00
  Filled 2019-03-20: qty 30

## 2019-03-20 MED ORDER — POLYVINYL ALCOHOL 1.4 % OP SOLN
1.0000 [drp] | OPHTHALMIC | Status: DC | PRN
  Filled 2019-03-20: qty 15

## 2019-03-20 MED ORDER — ACETAMINOPHEN 325 MG PO TABS
325.0000 mg | ORAL_TABLET | ORAL | Status: DC | PRN
  Administered 2019-03-20 – 2019-03-27 (×10): 650 mg via ORAL
  Filled 2019-03-20 (×10): qty 2

## 2019-03-20 MED ORDER — HEPARIN BOLUS VIA INFUSION
2700.0000 [IU] | Freq: Once | INTRAVENOUS | Status: AC
  Administered 2019-03-20: 2700 [IU] via INTRAVENOUS
  Filled 2019-03-20 (×2): qty 2700

## 2019-03-20 MED ORDER — ALBUTEROL SULFATE (2.5 MG/3ML) 0.083% IN NEBU: 2.5000 mg | INHALATION_SOLUTION | Freq: Four times a day (QID) | RESPIRATORY_TRACT | Status: DC | PRN

## 2019-03-20 MED ORDER — SPIRONOLACTONE 12.5 MG HALF TABLET
12.5000 mg | ORAL_TABLET | Freq: Every day | ORAL | Status: DC
  Administered 2019-03-21 – 2019-03-28 (×8): 12.5 mg via ORAL
  Filled 2019-03-20 (×9): qty 1

## 2019-03-20 NOTE — ED Triage Notes (Signed)
Pt states she tripped and fell this morning, hitting her head, and injuring her right wrist.

## 2019-03-20 NOTE — Progress Notes (Signed)
Unit is out of telemetry boxes. Called [facility] to make them aware and see if they have any luck finding one.  Waiting on call back.

## 2019-03-20 NOTE — Progress Notes (Signed)
Orthopedic Tech Progress Note Patient Details:  Carrie [name] Apr 18, 1941 [id number]  Ortho Devices Type of Ortho Device: Sugartong splint       Melony Overly T 03/20/2019, 4:24 PM

## 2019-03-20 NOTE — ED Notes (Signed)
Carrie [name] (husband) [phone] cell phone [phone] home

## 2019-03-20 NOTE — ED Notes (Signed)
Husband waiting outside, left his cell phone at home, if needed call sort RN

## 2019-03-20 NOTE — Progress Notes (Addendum)
Patient responsive, A&Ox1 to self at baseline and answering questions appropriately. No signs of bleeding. Patient denies any chest pain, lightheadedness, dizziness, or dyspnea. Vital signs recorded. Spoke with patients husband at 10:10pm to give an update on patients condition and explained that she would be transferred to another unit, he did not have any questions and told him. We would contact him with further updates. On call MD K. Schorr notified.

## 2019-03-20 NOTE — ED Notes (Addendum)
ED TO INPATIENT HANDOFF REPORT  ED Nurse Name and Phone #: [phone]  S Name/Age/Gender Carrie [name] 78 y.o. female Room/Bed: 047C/047C  Code Status   Code Status: Prior  Home/SNF/Other Home Patient oriented to: self and place Is this baseline? Yes   Triage Complete: Triage complete  Chief Complaint wrist injury  Triage Note Pt states she tripped and fell this morning, hitting her head, and injuring her right wrist.    Allergies Allergies  Allergen Reactions  . Codeine Nausea And Vomiting    Level of Care/Admitting Diagnosis ED Disposition    ED Disposition Condition [facility]: [place] [[id number]]  Level of Care: Telemetry Medical [893]  Covid Evaluation: Screening Protocol (No Symptoms)  Diagnosis: Right wrist fracture [[id number]]  Admitting Physician: Vashti Hey [[id number]]  Attending Physician: Vashti Hey [[id number]]  Estimated length of stay: past midnight tomorrow  Certification:: I certify this patient will need inpatient services for at least 2 midnights  PT Class (Do Not Modify): Inpatient [101]  PT Acc Code (Do Not Modify): Private [1]       B Medical/Surgery History Past Medical History:  Diagnosis Date  . Anticoagulated on Coumadin    for mech valve and atrial fib  goal 2.0-2.5  . Anxiety   . Arthritis    "right shoulder" (03/15/2013)  . Atrial fibrillation ([place])   . Atrial flutter ([place])   . CAD (coronary artery disease) 08/11/2016  . Chronic combined systolic and diastolic CHF (congestive heart failure) ([place])    a. 02/2016 Echo: EF 45-50%.  . Diverticula of colon   . GERD (gastroesophageal reflux disease)   . Hemorrhage intraabdominal 03/17/2012  . Hyperlipidemia   . Hypertension   . Liver hemorrhage   . Migraines   . 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 Bi-leaflet St. Jude mechanical prosthesis; b. 02/2016 Echo: EF 45-50%, some degree of MR, sev dil LA/RA, sev  TR.  . NSTEMI (non-ST elevated myocardial infarction) ([place])    a. 02/2016 elev trop/Cath: nonobs dzs,   . Pacemaker    medtronic adapta  . Severe tricuspid regurgitation    a. 02/2016 Echo: Ef 45-50%, sev TR, PASP 15mmHg.  . Sick sinus syndrome ([place])    Dr Cristopher Peru. EP study negative. Pacemaker 12/15/06 Medtronic  . Stroke [facility])    "they say I had a stroke last year" denies residual on 03/15/2013  . Subdural hematoma [facility])    Past Surgical History:  Procedure Laterality Date  . APPENDECTOMY    . CARDIAC CATHETERIZATION  04/02/97   R&L:severe MR/pulmonary hypertension  . CARDIAC CATHETERIZATION N/A 03/16/2016   Procedure: Left Heart Cath and Coronary Angiography;  Surgeon: [name], MD;  Location: [place] CV LAB;  Service: Cardiovascular;  Laterality: N/A;  . CARDIAC CATHETERIZATION N/A 09/30/2016   Procedure: Left Heart Cath and Coronary Angiography;  Surgeon: [name], MD;  Location: [place] CV LAB;  Service: Cardiovascular;  Laterality: N/A;  . CARDIAC CATHETERIZATION N/A 09/30/2016   Procedure: Coronary Stent Intervention;  Surgeon: [name], MD;  3.5 x 12 mm resolute Onyx  to ostial RCA  . CATARACT EXTRACTION W/ INTRAOCULAR LENS  IMPLANT, BILATERAL Bilateral 2013  . CORONARY ANGIOPLASTY    . DILATION AND CURETTAGE OF UTERUS     "had fibroids" (03/15/2013)  . EXPLORATORY LAPAROTOMY     "had a growth on my intestines" (03/15/2013)  . HAMMER TOE SURGERY Right   .  INSERT / REPLACE / REMOVE PACEMAKER  12/15/2006   Medtronic  . IR ANGIO VERTEBRAL SEL SUBCLAVIAN INNOMINATE UNI R MOD SED  02/17/2019  . IR CT HEAD LTD  02/17/2019  . IR PERCUTANEOUS ART THROMBECTOMY/INFUSION INTRACRANIAL INC DIAG ANGIO  02/17/2019  . MITRAL VALVE REPLACEMENT  1998   St Jude mechanical; Dr. [name]  . PACEMAKER PLACEMENT  12/15/06   medtronic adapta for SSS  . PERMANENT PACEMAKER GENERATOR CHANGE N/A 07/24/2014   Procedure: PERMANENT PACEMAKER GENERATOR CHANGE;  Surgeon:  [name], MD;  Location: [place] CATH LAB;  Service: Cardiovascular;  Laterality: N/A;  . PERSANTINE CARDIOLITE  08/07/03   mild inf. ischemia   . RADIOLOGY WITH ANESTHESIA N/A 02/17/2019   Procedure: IR WITH ANESTHESIA;  Surgeon: [name], MD;  Location: [place];  Service: Radiology;  Laterality: N/A;  . RIGHT HEART CATH N/A 02/06/2019   Procedure: RIGHT HEART CATH;  Surgeon: [name], MD;  Location: [place] CV LAB;  Service: Cardiovascular;  Laterality: N/A;  . TEE WITHOUT CARDIOVERSION  09/23/2011   Procedure: TRANSESOPHAGEAL ECHOCARDIOGRAM (TEE);  Surgeon: [name];  [facility]: [facility];  Service: Cardiovascular;  Laterality: N/A;  . TONSILLECTOMY    . TUBAL LIGATION    . US ECHOCARDIOGRAPHY  11/19/2011   EF 50-55%,RA mod to severely dilated,LA severely dilated,trace MR,small vegetation or mass on the MV,AOV mildly scleroticmild PI, RV pressure 40-39mmHg     A IV Location/Drains/Wounds Patient Lines/Drains/Airways Status   Active Line/Drains/Airways    Name:   Placement date:   Placement time:   Site:   Days:   Peripheral IV 03/20/19 Left;Anterior Forearm   03/20/19    1612    Forearm   less than 1   Pressure Injury 02/24/19 Heel Left Deep Tissue Injury - Purple or maroon localized area of discolored intact skin or blood-filled blister due to damage of underlying soft tissue from pressure and/or shear. darken area on left heel measured 2.5x1.5cm   02/24/19    1526     24          Intake/Output Last 24 hours No intake or output data in the 24 hours ending 03/20/19 1737  Labs/Imaging Results for orders placed or performed during the hospital encounter of 03/20/19 (from the past 48 hour(s))  CBC with Differential     Status: Abnormal   Collection Time: 03/20/19  1:25 PM  Result Value Ref Range   WBC 5.6 4.0 - 10.5 K/uL   RBC 2.78 (L) 3.87 - 5.11 MIL/uL   Hemoglobin 8.0 (L) 12.0 - 15.0 g/dL   HCT 25.8 (L) 36.0 - 46.0 %   MCV 92.8 80.0 - 100.0 fL    MCH 28.8 26.0 - 34.0 pg   MCHC 31.0 30.0 - 36.0 g/dL   RDW 35.1 (H) 11.5 - 15.5 %   Platelets 188 150 - 400 K/uL    Comment: REPEATED TO VERIFY PLATELET COUNT CONFIRMED BY SMEAR    nRBC 0.4 (H) 0.0 - 0.2 %   Neutrophils Relative % 65 %   Neutro Abs 3.7 1.7 - 7.7 K/uL   Lymphocytes Relative 14 %   Lymphs Abs 0.8 0.7 - 4.0 K/uL   Monocytes Relative 16 %   Monocytes Absolute 0.9 0.1 - 1.0 K/uL   Eosinophils Relative 4 %   Eosinophils Absolute 0.2 0.0 - 0.5 K/uL   Basophils Relative 1 %   Basophils Absolute 0.1 0.0 - 0.1 K/uL   Immature Granulocytes 0 %  Abs Immature Granulocytes 0.02 0.00 - 0.07 K/uL   Schistocytes PRESENT    Target Cells PRESENT     Comment: Performed at [facility], [place] [address] [place], [place] [address]  Basic metabolic panel     Status: Abnormal   Collection Time: 03/20/19  1:25 PM  Result Value Ref Range   Sodium 140 135 - 145 mmol/L   Potassium 4.0 3.5 - 5.1 mmol/L   Chloride 105 98 - 111 mmol/L   CO2 24 22 - 32 mmol/L   Glucose, Bld 104 (H) 70 - 99 mg/dL   BUN 38 (H) 8 - 23 mg/dL   Creatinine, Ser 1.46 (H) 0.44 - 1.00 mg/dL   Calcium 9.9 8.9 - 10.3 mg/dL   GFR calc non Af Amer 34 (L) >60 mL/min   GFR calc Af Amer 40 (L) >60 mL/min   Anion gap 11 5 - 15    Comment: Performed at [facility], [place] [address], [place], [place] [address]  Troponin I (High Sensitivity)     Status: Abnormal   Collection Time: 03/20/19  1:25 PM  Result Value Ref Range   Troponin I (High Sensitivity) 39 (H) <18 ng/L    Comment: (NOTE) Elevated high sensitivity troponin I (hsTnI) values and significant  changes across serial measurements may suggest ACS but many other  chronic and acute conditions are known to elevate hsTnI results.  Refer to the "Links" section for chest pain algorithms and additional  guidance. Performed at [facility], [place] [address] [place], [place] [address]   Brain natriuretic peptide     Status: Abnormal   Collection  Time: 03/20/19  1:25 PM  Result Value Ref Range   B Natriuretic Peptide 2,182.8 (H) 0.0 - 100.0 pg/mL    Comment: Performed at [place] [address], [place], [place] [address]  Protime-INR     Status: Abnormal   Collection Time: 03/20/19  1:25 PM  Result Value Ref Range   Prothrombin Time 20.8 (H) 11.4 - 15.2 seconds   INR 1.8 (H) 0.8 - 1.2    Comment: (NOTE) INR goal varies based on device and disease states. Performed at [facility], [place] [address], [place], [place] [address]   Urinalysis, Routine w reflex microscopic     Status: Abnormal   Collection Time: 03/20/19  2:04 PM  Result Value Ref Range   Color, Urine YELLOW YELLOW   APPearance HAZY (A) CLEAR   Specific Gravity, Urine 1.009 1.005 - 1.030   pH 7.0 5.0 - 8.0   Glucose, UA NEGATIVE NEGATIVE mg/dL   Hgb urine dipstick NEGATIVE NEGATIVE   Bilirubin Urine NEGATIVE NEGATIVE   Ketones, ur NEGATIVE NEGATIVE mg/dL   Protein, ur NEGATIVE NEGATIVE mg/dL   Nitrite NEGATIVE NEGATIVE   Leukocytes,Ua SMALL (A) NEGATIVE   RBC / HPF 0-5 0 - 5 RBC/hpf   WBC, UA 0-5 0 - 5 WBC/hpf   Bacteria, UA RARE (A) NONE SEEN    Comment: Performed at [facility] [address], [place], [place] [address]  Troponin I (High Sensitivity)     Status: Abnormal   Collection Time: 03/20/19  2:18 PM  Result Value Ref Range   Troponin I (High Sensitivity) 40 (H) <18 ng/L    Comment: (NOTE) Elevated high sensitivity troponin I (hsTnI) values and significant  changes across serial measurements may suggest ACS but many other  chronic and acute conditions are known to  elevate hsTnI results.  Refer to the "Links" section for chest pain algorithms and additional  guidance. Performed at [facility], [place] [address], [place], [place] [address]    Dg Chest 1 View  Result Date: 03/20/2019 CLINICAL DATA:  Recent fall EXAM: CHEST  1 VIEW COMPARISON:  02/23/2019 FINDINGS: Cardiac shadow remains enlarged. Postsurgical changes  as well as a pacing device are again seen. Mild central vascular congestion is noted stable from the prior study. Stable small right pleural effusion is noted. No focal infiltrate is seen. IMPRESSION: Stable vascular congestion and small right pleural effusion. Electronically Signed   By: Inez Catalina M.D.   On: 03/20/2019 13:12   Dg Wrist Complete Right  Result Date: 03/20/2019 CLINICAL DATA:  Recent fall with wrist pain, initial encounter EXAM: RIGHT WRIST - COMPLETE 3+ VIEW COMPARISON:  None. FINDINGS: Comminuted distal radial fracture is noted with impaction and posterior angulation at the fracture site. There is approximately 3/4 width bone displacement posteriorly. Avulsion fracture of the ulnar styloid is noted as well. There appears to be intra-articular involvement of the radial fracture. Soft tissue swelling is noted. IMPRESSION: Distal radial and ulnar fractures as described. Intra-articular involvement of the radial fracture is seen as well as posterior displacement and angulation. Electronically Signed   By: Inez Catalina M.D.   On: 03/20/2019 13:11   Ct Head Wo Contrast  Result Date: 03/20/2019 CLINICAL DATA:  Status post fall, striking her head EXAM: CT HEAD WITHOUT CONTRAST CT CERVICAL SPINE WITHOUT CONTRAST TECHNIQUE: Multidetector CT imaging of the head and cervical spine was performed following the standard protocol without intravenous contrast. Multiplanar CT image reconstructions of the cervical spine were also generated. COMPARISON:  02/24/2019, 02/22/2019 FINDINGS: CT HEAD FINDINGS Brain: No evidence of acute infarction, acute hemorrhage, extra-axial collection, ventriculomegaly, or mass effect. 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 Generalized cerebral atrophy. Periventricular white matter low attenuation likely secondary to microangiopathy. Vascular: Cerebrovascular atherosclerotic calcifications are noted. Skull: Negative for fracture or focal  lesion. Sinuses/Orbits: Visualized portions of the orbits are unremarkable. Mastoid sinuses are clear. Right maxillary sinus mucosal thickening. Mild right ethmoid sinus mucosal thickening. Other: None. CT CERVICAL SPINE FINDINGS Alignment: Normal. Skull base and vertebrae: No acute fracture. No primary bone lesion or focal pathologic process. Soft tissues and spinal canal: No prevertebral fluid or swelling. No visible canal hematoma. Disc levels: Degenerative disc disease with disc height loss C5-6. Bilateral facet arthropathy at C6-7 on the right. No foraminal stenosis. Upper chest: Lung apices are clear. Other: Thoracic aortic atherosclerosis. IMPRESSION: 1. No acute intracranial pathology. Subacute-chronic right posterior fossa subdural hematoma. 2. No acute osseous injury of the cervical spine. 3.  Aortic Atherosclerosis (ICD10-I70.0). Electronically Signed   By: Kathreen Devoid   On: 03/20/2019 13:00   Ct Cervical Spine Wo Contrast  Result Date: 03/20/2019 CLINICAL DATA:  Status post fall, striking her head EXAM: CT HEAD WITHOUT CONTRAST CT CERVICAL SPINE WITHOUT CONTRAST TECHNIQUE: Multidetector CT imaging of the head and cervical spine was performed following the standard protocol without intravenous contrast. Multiplanar CT image reconstructions of the cervical spine were also generated. COMPARISON:  02/24/2019, 02/22/2019 FINDINGS: CT HEAD FINDINGS Brain: No evidence of acute infarction, acute hemorrhage, extra-axial collection, ventriculomegaly, or mass effect. 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 Generalized cerebral atrophy. Periventricular white matter low attenuation likely secondary to microangiopathy. Vascular: Cerebrovascular atherosclerotic calcifications are noted. Skull: Negative for fracture or focal lesion. Sinuses/Orbits: Visualized portions of the orbits are  unremarkable. Mastoid sinuses are clear. Right maxillary sinus mucosal thickening. Mild right  ethmoid sinus mucosal thickening. Other: None. CT CERVICAL SPINE FINDINGS Alignment: Normal. Skull base and vertebrae: No acute fracture. No primary bone lesion or focal pathologic process. Soft tissues and spinal canal: No prevertebral fluid or swelling. No visible canal hematoma. Disc levels: Degenerative disc disease with disc height loss C5-6. Bilateral facet arthropathy at C6-7 on the right. No foraminal stenosis. Upper chest: Lung apices are clear. Other: Thoracic aortic atherosclerosis. IMPRESSION: 1. No acute intracranial pathology. Subacute-chronic right posterior fossa subdural hematoma. 2. No acute osseous injury of the cervical spine. 3.  Aortic Atherosclerosis (ICD10-I70.0). Electronically Signed   By: Kathreen Devoid   On: 03/20/2019 13:00    Pending Labs Unresulted Labs (From admission, onward)    Start     Ordered   03/20/19 1604  SARS Coronavirus 2 (CEPHEID- Performed in [place] hospital lab), Hosp Order  (Symptomatic Patients Labs with Precautions )  Once,   STAT     03/20/19 1604   03/20/19 1426  Urine culture  ONCE - STAT,   STAT     03/20/19 1425   03/20/19 1325  Pathologist smear review  Once,   STAT     03/20/19 1325          Vitals/Pain Today's Vitals   03/20/19 1534 03/20/19 1545 03/20/19 1600 03/20/19 1645  BP:  105/66 107/62 (!) 121/59  Pulse:      Resp:  (!) 28 19 18   Temp:      TempSrc:      SpO2:  97% 99% 98%  Weight:      Height:      PainSc: 5        Isolation Precautions No active isolations  Medications Medications  morphine 2 MG/ML injection 2 mg (2 mg Intravenous Given 03/20/19 1418)  lidocaine (XYLOCAINE) 1 % (with pres) injection 10 mL ( Other Given by Other 03/20/19 1533)    Mobility walks with device High fall risk   Focused Assessments Cardiac Assessment Handoff:  Cardiac Rhythm: Atrial fibrillation Lab Results  Component Value Date   CKTOTAL 116 09/06/2011   CKMB 2.3 09/06/2011   TROPONINI 0.05 (HH) 02/02/2019   No results  found for: DDIMER Does the Patient currently have chest pain? No     R Recommendations: See Admitting Provider Note  Report given to:   Additional Notes:  Plan for ORIF tomorrow with Dr. Lenon Curt. Please keep NPO after MN and hold coumadin until after surgery. If primary team feel anticoagulation is necessary in the interim would suggest IV heparin with plan to hold 6h before surgery.  Covid Negative

## 2019-03-20 NOTE — Significant Event (Addendum)
Rapid Response Event Note  Overview:Notified by Hilbert Bible, NP that pt was given 25,000 unit bolus of heparin instead of 2700 units Time Called: 2145 Arrival Time: 2230 Event Type: Other (Comment)(heparin bolused at increased dose than ordered)  Initial Focused Assessment: Pt laying in bed with eyes closed, awakens easily when name called, Pt is alert and oriented x 3, however does have some confusion (dementia at baseline). Pupils 6, round, and brisk. Pt moves all extremities to command. R arm is in a cast so extremity movement is somewhat restricted at this time. Bruise on L arm noted from admission. No s/s of active bleeding at this time. Interventions:  Plan of Care (if not transferred): Transferred to 2C10. Event Summary: Name of Physician Notified: Schorr, NP at 2145(NP called RRT)    at    Outcome: Transferred (Comment)(2C10)     Linna Darner, Carren Rang

## 2019-03-20 NOTE — Progress Notes (Signed)
Report has been given to [facility] nurse. All questions answered. Mr. Troup, patients husband contacted and made aware of transfer and given units number in case any additional information is needed.

## 2019-03-20 NOTE — Anesthesia Preprocedure Evaluation (Deleted)
Anesthesia Evaluation    Reviewed: Allergy & Precautions, Patient's Chart, lab work & pertinent test results  History of Anesthesia Complications Negative for: history of anesthetic complications  Airway        Dental   Pulmonary neg pulmonary ROS,           Cardiovascular hypertension, + CAD, + Past MI, + Cardiac Stents and +CHF  + dysrhythmias Atrial Fibrillation + pacemaker + Valvular Problems/Murmurs (s/p MVR; severe TR)      Neuro/Psych PSYCHIATRIC DISORDERS Dementia CVA    GI/Hepatic Neg liver ROS, GERD  ,  Endo/Other  negative endocrine ROS  Renal/GU Renal InsufficiencyRenal disease  negative genitourinary   Musculoskeletal negative musculoskeletal ROS (+)   Abdominal   Peds  Hematology negative hematology ROS (+) anemia ,   Anesthesia Other Findings   Reproductive/Obstetrics                             Anesthesia Physical Anesthesia Plan  ASA: III  Anesthesia Plan: Regional   Post-op Pain Management:  Regional for Post-op pain   Induction:   PONV Risk Score and Plan: 2 and Ondansetron, Propofol infusion and Treatment may vary due to age or medical condition  Airway Management Planned: Nasal Cannula, Natural Airway and Simple Face Mask  Additional Equipment: None  Intra-op Plan:   Post-operative Plan:   Informed Consent:   Plan Discussed with:   Anesthesia Plan Comments:         Anesthesia Quick Evaluation

## 2019-03-20 NOTE — Progress Notes (Signed)
ANTICOAGULATION CONSULT NOTE - Initial Consult  Pharmacy Consult for Heparin Indication: Mechanical heart valve and atrial fibrillation  Allergies  Allergen Reactions  . Codeine Nausea And Vomiting    Patient Measurements: Height: 5\' 1"  (154.9 cm) Weight: 119 lb (54 kg) IBW/kg (Calculated) : 47.8 Heparin Dosing Weight: 54 kg  Vital Signs: Temp: 98.5 F (36.9 C) (06/29 1153) Temp Source: Oral (06/29 1153) BP: 121/59 (06/29 1645) Pulse Rate: 74 (06/29 1530)  Labs: Recent Labs    03/20/19 1325 03/20/19 1418  HGB 8.0*  --   HCT 25.8*  --   PLT 188  --   LABPROT 20.8*  --   INR 1.8*  --   CREATININE 1.46*  --   TROPONINIHS 39* 40*    Estimated Creatinine Clearance: 24.4 mL/min (A) (by C-G formula based on SCr of 1.46 mg/dL (H)).   Medical History: Past Medical History:  Diagnosis Date  . Anticoagulated on Coumadin    for mech valve and atrial fib  goal 2.0-2.5  . Anxiety   . Arthritis    "right shoulder" (03/15/2013)  . Atrial fibrillation ([place])   . Atrial flutter ([place])   . CAD (coronary artery disease) 08/11/2016  . Chronic combined systolic and diastolic CHF (congestive heart failure) ([place])    a. 02/2016 Echo: EF 45-50%.  . Diverticula of colon   . GERD (gastroesophageal reflux disease)   . Hemorrhage intraabdominal 03/17/2012  . Hyperlipidemia   . Hypertension   . Liver hemorrhage   . Migraines   . 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 Bi-leaflet St. Jude mechanical prosthesis; b. 02/2016 Echo: EF 45-50%, some degree of MR, sev dil LA/RA, sev TR.  . NSTEMI (non-ST elevated myocardial infarction) ([place])    a. 02/2016 elev trop/Cath: nonobs dzs,   . Pacemaker    medtronic adapta  . Severe tricuspid regurgitation    a. 02/2016 Echo: Ef 45-50%, sev TR, PASP 31mmHg.  . Sick sinus syndrome ([place])    Dr Cristopher Peru. EP study negative. Pacemaker 12/15/06 Medtronic  . Stroke [facility])    "they say I had a stroke last year" denies residual on 03/15/2013   . Subdural hematoma [facility])      Assessment: Patient is a 78 year old female with past medical history of atrial fibrillation, mechanical heart valve on Coumadin (goal INR 2-2.5), CAD, combined systolic and diastolic heart failure, chronic subdural hematoma, and STEMI, sick sinus syndrome status post pacemaker, dementia presenting for fall with distal radius fracture.  Pharmacy is consulted to start a heparin drip for the mechanical valve and atrial fibrillation.  Goal of Therapy:  Heparin level 0.3-0.7 units/ml Monitor platelets by anticoagulation protocol: Yes   Plan:  Give 2700 units bolus x 1  Start heparin infusion at 750 units/hr Draw a heparin level 8 hours after the start of the infusion and daily Monitor H&H and Platelets while on heparin infusion   Alanda Slim, PharmD, [facility] Clinical Pharmacist Please see AMION for all Pharmacists' Contact Phone Numbers 03/20/2019, 5:55 PM

## 2019-03-20 NOTE — Progress Notes (Signed)
Shift event note:  Notified by Caryl Pina, RN regarding pt receiving accidental overdose of IV Heparin. Pt was d/t get 2700 unit IV bolus of Heparin then a continuous infusion at 750 units/hr or 7.5 ml/hr. D/t incorrect programming of IV med pump pt actually rec'd nearly entire bag of Heparin (per Rx approximately 25,000 units IV Heparin). RN reports there are currently no s/s bleeding. Pt is alert and able to answer baseline questions though there is h/o some baseline dementia. VSS.  Plan: 1. Heparin overdose: Med administration error. RN to safety portal event.  Dr Myna Hidalgo on [facility] made aware. "Mindy" RR RN notified to place pt on RR assessment radar. Consulted w/ Rx. Will give Protamine Sulfate 50 mg IV now. Rx: "Benjamine Mola" suspects it may take more than one dose to reverse Heparin load. Rx to monitor Heparin levels and notify this NP if further Protamine Sulfate indicated. Will transfer to progressive unit for closer monitoring. "Caryl Pina" RN ask to notify husband of event. Will continue to monitor closely on progressive care unit.  Jeryl Columbia, NP-C [facility] Pager [phone]

## 2019-03-20 NOTE — Telephone Encounter (Signed)
Call placed to Amy, home health nurse. She stated that the patient went to the ED for a fall.  She wanted to clarify if the patient needed to be on Potassium and Atorvastatin since the medications were not on the patient's list but the patient does have the medication in the home. According to epic, the patient is not currently on these medications.  Amy will call back tomorrow with an update.

## 2019-03-20 NOTE — ED Notes (Signed)
Ortho PA at the bedside 

## 2019-03-20 NOTE — ED Notes (Signed)
Pt went to UC this AM and has imaging and summary at bedside.

## 2019-03-20 NOTE — Consult Note (Signed)
Reason for Consult:Right wrist fx Referring Physician: B Carrie [name] is an 78 y.o. female.  HPI: Carrie [name] had a syncopal event earlier today and fell. She hurt her right wrist. She was brought to the ED where x-rays showed a distal radius fx and orthopedic surgery was consulted. She c/o localized pain to the wrist. She is RHD.  Past Medical History:  Diagnosis Date  . Anticoagulated on Coumadin    for mech valve and atrial fib  goal 2.0-2.5  . Anxiety   . Arthritis    "right shoulder" (03/15/2013)  . Atrial fibrillation ([place])   . Atrial flutter ([place])   . CAD (coronary artery disease) 08/11/2016  . Chronic combined systolic and diastolic CHF (congestive heart failure) ([place])    a. 02/2016 Echo: EF 45-50%.  . Diverticula of colon   . GERD (gastroesophageal reflux disease)   . Hemorrhage intraabdominal 03/17/2012  . Hyperlipidemia   . Hypertension   . Liver hemorrhage   . Migraines   . 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 Bi-leaflet St. Jude mechanical prosthesis; b. 02/2016 Echo: EF 45-50%, some degree of MR, sev dil LA/RA, sev TR.  . NSTEMI (non-ST elevated myocardial infarction) ([place])    a. 02/2016 elev trop/Cath: nonobs dzs,   . Pacemaker    medtronic adapta  . Severe tricuspid regurgitation    a. 02/2016 Echo: Ef 45-50%, sev TR, PASP 38mmHg.  . Sick sinus syndrome ([place])    Dr Cristopher Peru. EP study negative. Pacemaker 12/15/06 Medtronic  . Stroke [facility])    "they say I had a stroke last year" denies residual on 03/15/2013  . Subdural hematoma [facility])     Past Surgical History:  Procedure Laterality Date  . APPENDECTOMY    . CARDIAC CATHETERIZATION  04/02/97   R&L:severe MR/pulmonary hypertension  . CARDIAC CATHETERIZATION N/A 03/16/2016   Procedure: Left Heart Cath and Coronary Angiography;  Surgeon: [name], MD;  Location: [place] [facility];  Service: Cardiovascular;  Laterality: N/A;  . CARDIAC CATHETERIZATION N/A 09/30/2016   Procedure: Left  Heart Cath and Coronary Angiography;  Surgeon: [name], MD;  Location: [place] [facility];  Service: Cardiovascular;  Laterality: N/A;  . CARDIAC CATHETERIZATION N/A 09/30/2016   Procedure: Coronary Stent Intervention;  Surgeon: [name], MD;  3.5 x 12 mm resolute Onyx  to ostial RCA  . CATARACT EXTRACTION W/ INTRAOCULAR LENS  IMPLANT, BILATERAL Bilateral 2013  . CORONARY ANGIOPLASTY    . DILATION AND CURETTAGE OF UTERUS     "had fibroids" (03/15/2013)  . EXPLORATORY LAPAROTOMY     "had a growth on my intestines" (03/15/2013)  . HAMMER TOE SURGERY Right   . INSERT / REPLACE / REMOVE PACEMAKER  12/15/2006   Medtronic  . IR ANGIO VERTEBRAL SEL SUBCLAVIAN INNOMINATE UNI R MOD SED  02/17/2019  . IR CT HEAD LTD  02/17/2019  . IR PERCUTANEOUS ART THROMBECTOMY/INFUSION INTRACRANIAL INC DIAG ANGIO  02/17/2019  . MITRAL VALVE REPLACEMENT  1998   St Jude mechanical; Dr. [name]  . PACEMAKER PLACEMENT  12/15/06   medtronic adapta for SSS  . PERMANENT PACEMAKER GENERATOR CHANGE N/A 07/24/2014   Procedure: PERMANENT PACEMAKER GENERATOR CHANGE;  Surgeon: [name], MD;  Location: [facility];  Service: Cardiovascular;  Laterality: N/A;  . PERSANTINE CARDIOLITE  08/07/03   mild inf. ischemia   . RADIOLOGY WITH ANESTHESIA N/A 02/17/2019   Procedure: IR WITH ANESTHESIA;  Surgeon: [name], MD;  Location:  [facility] OR;  Service: Radiology;  Laterality: N/A;  . RIGHT HEART CATH N/A 02/06/2019   Procedure: RIGHT HEART CATH;  Surgeon: [name], MD;  Location: [place] [facility];  Service: Cardiovascular;  Laterality: N/A;  . TEE WITHOUT CARDIOVERSION  09/23/2011   Procedure: TRANSESOPHAGEAL ECHOCARDIOGRAM (TEE);  Surgeon: [name];  Location: [facility];  Service: Cardiovascular;  Laterality: N/A;  . TONSILLECTOMY    . TUBAL LIGATION    . US ECHOCARDIOGRAPHY  11/19/2011   EF 50-55%,RA mod to severely dilated,LA severely dilated,trace MR,small vegetation or mass on the  MV,AOV mildly scleroticmild PI, RV pressure 40-26mmHg    Family History  Problem Relation Age of Onset  . Cancer Mother   . Stroke Father   . Cancer Sister   . Leukemia Sister   . Healthy Brother   . Healthy Sister   . Diabetes Sister   . Healthy Brother   . Healthy Brother   . Heart attack Maternal Grandfather   . Kidney disease Daughter   . Stroke Brother     Social History:  reports that she has never smoked. She has never used smokeless tobacco. She reports that she does not drink alcohol or use drugs.  Allergies:  Allergies  Allergen Reactions  . Codeine Nausea And Vomiting    Medications: I have reviewed the patient's current medications.  Results for orders placed or performed during the hospital encounter of 03/20/19 (from the past 48 hour(s))  CBC with Differential     Status: Abnormal   Collection Time: 03/20/19  1:25 PM  Result Value Ref Range   WBC 5.6 4.0 - 10.5 K/uL   RBC 2.78 (L) 3.87 - 5.11 MIL/uL   Hemoglobin 8.0 (L) 12.0 - 15.0 g/dL   HCT 25.8 (L) 36.0 - 46.0 %   MCV 92.8 80.0 - 100.0 fL   MCH 28.8 26.0 - 34.0 pg   MCHC 31.0 30.0 - 36.0 g/dL   RDW 35.1 (H) 11.5 - 15.5 %   Platelets 188 150 - 400 K/uL    Comment: REPEATED TO VERIFY PLATELET COUNT CONFIRMED BY SMEAR    nRBC 0.4 (H) 0.0 - 0.2 %   Neutrophils Relative % 65 %   Neutro Abs 3.7 1.7 - 7.7 K/uL   Lymphocytes Relative 14 %   Lymphs Abs 0.8 0.7 - 4.0 K/uL   Monocytes Relative 16 %   Monocytes Absolute 0.9 0.1 - 1.0 K/uL   Eosinophils Relative 4 %   Eosinophils Absolute 0.2 0.0 - 0.5 K/uL   Basophils Relative 1 %   Basophils Absolute 0.1 0.0 - 0.1 K/uL   Immature Granulocytes 0 %   Abs Immature Granulocytes 0.02 0.00 - 0.07 K/uL   Schistocytes PRESENT    Target Cells PRESENT     Comment: Performed at [facility], [address] [place], [place] [address]  Basic metabolic panel     Status: Abnormal   Collection Time: 03/20/19  1:25 PM  Result Value Ref Range   Sodium 140  135 - 145 mmol/L   Potassium 4.0 3.5 - 5.1 mmol/L   Chloride 105 98 - 111 mmol/L   CO2 24 22 - 32 mmol/L   Glucose, Bld 104 (H) 70 - 99 mg/dL   BUN 38 (H) 8 - 23 mg/dL   Creatinine, Ser 1.46 (H) 0.44 - 1.00 mg/dL   Calcium 9.9 8.9 - 10.3 mg/dL   GFR calc non Af Amer 34 (L) >60 mL/min  GFR calc Af Amer 40 (L) >60 mL/min   Anion gap 11 5 - 15    Comment: Performed at [facility] [address], [place], [place] [address]  Troponin I (High Sensitivity)     Status: Abnormal   Collection Time: 03/20/19  1:25 PM  Result Value Ref Range   Troponin I (High Sensitivity) 39 (H) <18 ng/L    Comment: (NOTE) Elevated high sensitivity troponin I (hsTnI) values and significant  changes across serial measurements may suggest ACS but many other  chronic and acute conditions are known to elevate hsTnI results.  Refer to the "Links" section for chest pain algorithms and additional  guidance. Performed at [facility], [place] [address], [place], [place] [address]   Brain natriuretic peptide     Status: Abnormal   Collection Time: 03/20/19  1:25 PM  Result Value Ref Range   B Natriuretic Peptide 2,182.8 (H) 0.0 - 100.0 pg/mL    Comment: Performed at [facility] [address] [place], [place] [address]  Protime-INR     Status: Abnormal   Collection Time: 03/20/19  1:25 PM  Result Value Ref Range   Prothrombin Time 20.8 (H) 11.4 - 15.2 seconds   INR 1.8 (H) 0.8 - 1.2    Comment: (NOTE) INR goal varies based on device and disease states. Performed at [facility], [place] [address], [place], [place] [address]   Urinalysis, Routine w reflex microscopic     Status: Abnormal   Collection Time: 03/20/19  2:04 PM  Result Value Ref Range   Color, Urine YELLOW YELLOW   APPearance HAZY (A) CLEAR   Specific Gravity, Urine 1.009 1.005 - 1.030   pH 7.0 5.0 - 8.0   Glucose, UA NEGATIVE NEGATIVE mg/dL   Hgb urine dipstick NEGATIVE NEGATIVE   Bilirubin Urine NEGATIVE NEGATIVE    Ketones, ur NEGATIVE NEGATIVE mg/dL   Protein, ur NEGATIVE NEGATIVE mg/dL   Nitrite NEGATIVE NEGATIVE   Leukocytes,Ua SMALL (A) NEGATIVE   RBC / HPF 0-5 0 - 5 RBC/hpf   WBC, UA 0-5 0 - 5 WBC/hpf   Bacteria, UA RARE (A) NONE SEEN    Comment: Performed at [facility] [address] [place], [place] [address]    Dg Chest 1 View  Result Date: 03/20/2019 CLINICAL DATA:  Recent fall EXAM: CHEST  1 VIEW COMPARISON:  02/23/2019 FINDINGS: Cardiac shadow remains enlarged. Postsurgical changes as well as a pacing device are again seen. Mild central vascular congestion is noted stable from the prior study. Stable small right pleural effusion is noted. No focal infiltrate is seen. IMPRESSION: Stable vascular congestion and small right pleural effusion. Electronically Signed   By: Inez Catalina M.D.   On: 03/20/2019 13:12   Dg Wrist Complete Right  Result Date: 03/20/2019 CLINICAL DATA:  Recent fall with wrist pain, initial encounter EXAM: RIGHT WRIST - COMPLETE 3+ VIEW COMPARISON:  None. FINDINGS: Comminuted distal radial fracture is noted with impaction and posterior angulation at the fracture site. There is approximately 3/4 width bone displacement posteriorly. Avulsion fracture of the ulnar styloid is noted as well. There appears to be intra-articular involvement of the radial fracture. Soft tissue swelling is noted. IMPRESSION: Distal radial and ulnar fractures as described. Intra-articular involvement of the radial fracture is seen as well as posterior displacement and angulation. Electronically Signed   By: Inez Catalina M.D.   On: 03/20/2019 13:11   Ct Head Wo Contrast  Result Date: 03/20/2019 CLINICAL DATA:  Status post fall, striking her head EXAM: CT HEAD WITHOUT CONTRAST CT CERVICAL SPINE WITHOUT CONTRAST TECHNIQUE: Multidetector CT imaging of the head and cervical spine was performed following the standard protocol without intravenous contrast. Multiplanar CT image reconstructions of the  cervical spine were also generated. COMPARISON:  02/24/2019, 02/22/2019 FINDINGS: CT HEAD FINDINGS Brain: No evidence of acute infarction, acute hemorrhage, extra-axial collection, ventriculomegaly, or mass effect. 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 Generalized cerebral atrophy. Periventricular white matter low attenuation likely secondary to microangiopathy. Vascular: Cerebrovascular atherosclerotic calcifications are noted. Skull: Negative for fracture or focal lesion. Sinuses/Orbits: Visualized portions of the orbits are unremarkable. Mastoid sinuses are clear. Right maxillary sinus mucosal thickening. Mild right ethmoid sinus mucosal thickening. Other: None. CT CERVICAL SPINE FINDINGS Alignment: Normal. Skull base and vertebrae: No acute fracture. No primary bone lesion or focal pathologic process. Soft tissues and spinal canal: No prevertebral fluid or swelling. No visible canal hematoma. Disc levels: Degenerative disc disease with disc height loss C5-6. Bilateral facet arthropathy at C6-7 on the right. No foraminal stenosis. Upper chest: Lung apices are clear. Other: Thoracic aortic atherosclerosis. IMPRESSION: 1. No acute intracranial pathology. Subacute-chronic right posterior fossa subdural hematoma. 2. No acute osseous injury of the cervical spine. 3.  Aortic Atherosclerosis (ICD10-I70.0). Electronically Signed   By: Kathreen Devoid   On: 03/20/2019 13:00   Ct Cervical Spine Wo Contrast  Result Date: 03/20/2019 CLINICAL DATA:  Status post fall, striking her head EXAM: CT HEAD WITHOUT CONTRAST CT CERVICAL SPINE WITHOUT CONTRAST TECHNIQUE: Multidetector CT imaging of the head and cervical spine was performed following the standard protocol without intravenous contrast. Multiplanar CT image reconstructions of the cervical spine were also generated. COMPARISON:  02/24/2019, 02/22/2019 FINDINGS: CT HEAD FINDINGS Brain: No evidence of acute infarction, acute hemorrhage,  extra-axial collection, ventriculomegaly, or mass effect. 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 Generalized cerebral atrophy. Periventricular white matter low attenuation likely secondary to microangiopathy. Vascular: Cerebrovascular atherosclerotic calcifications are noted. Skull: Negative for fracture or focal lesion. Sinuses/Orbits: Visualized portions of the orbits are unremarkable. Mastoid sinuses are clear. Right maxillary sinus mucosal thickening. Mild right ethmoid sinus mucosal thickening. Other: None. CT CERVICAL SPINE FINDINGS Alignment: Normal. Skull base and vertebrae: No acute fracture. No primary bone lesion or focal pathologic process. Soft tissues and spinal canal: No prevertebral fluid or swelling. No visible canal hematoma. Disc levels: Degenerative disc disease with disc height loss C5-6. Bilateral facet arthropathy at C6-7 on the right. No foraminal stenosis. Upper chest: Lung apices are clear. Other: Thoracic aortic atherosclerosis. IMPRESSION: 1. No acute intracranial pathology. Subacute-chronic right posterior fossa subdural hematoma. 2. No acute osseous injury of the cervical spine. 3.  Aortic Atherosclerosis (ICD10-I70.0). Electronically Signed   By: Kathreen Devoid   On: 03/20/2019 13:00    Review of Systems  Constitutional: Negative for weight loss.  HENT: Negative for ear discharge, ear pain, hearing loss and tinnitus.   Eyes: Negative for blurred vision, double vision, photophobia and pain.  Respiratory: Negative for cough, sputum production and shortness of breath.   Cardiovascular: Negative for chest pain.  Gastrointestinal: Negative for abdominal pain, nausea and vomiting.  Genitourinary: Negative for dysuria, flank pain, frequency and urgency.  Musculoskeletal: Positive for joint pain (Right wrist). Negative for back pain, falls, myalgias and neck pain.  Neurological: Positive for loss of consciousness. Negative for dizziness, tingling,  sensory change, focal weakness and headaches.  Endo/Heme/Allergies: Does not bruise/bleed easily.  Psychiatric/Behavioral:  Negative for depression, memory loss and substance abuse. The patient is not nervous/anxious.    Blood pressure 108/71, pulse 67, temperature 98.5 F (36.9 C), temperature source Oral, resp. rate (!) 23, height 5\' 1"  (1.549 m), weight 54 kg, SpO2 96 %. Physical Exam  Constitutional: She appears well-developed and well-nourished. No distress.  HENT:  Head: Normocephalic and atraumatic.  Eyes: Conjunctivae are normal. Right eye exhibits no discharge. Left eye exhibits no discharge. No scleral icterus.  Neck: Normal range of motion.  Cardiovascular: Normal rate and regular rhythm.  Respiratory: Effort normal. No respiratory distress.  Musculoskeletal:     Comments: Right shoulder, elbow, wrist, digits- no skin wounds, wrist deformity, mod TTP  Sens  Ax/R/M/U intact  Mot   Ax/ R/ PIN/ M/ AIN/ U grossly intact  Rad 2+  Neurological: She is alert.  Skin: Skin is warm and dry. She is not diaphoretic.  Psychiatric: She has a normal mood and affect. Her behavior is normal.    Assessment/Plan: Right wrist fx -- Will attempt to reduce and splint. Plan for ORIF tomorrow with Dr. Lenon Curt. Please keep NPO after MN and hold coumadin until after surgery. If primary team feel anticoagulation is necessary in the interim would suggest IV heparin with plan to hold 6h before surgery. Syncope -- Workup per primary team Multiple medical problems including atrial fibrillation, mechanical heart valve on Coumadin (goal INR 2-2.5), CAD, combined systolic and diastolic heart failure, chronic subdural hematoma, and STEMI, sick sinus syndrome status post pacemaker, dementia -- per primary service    Lisette Abu, PA-C Orthopedic Surgery [phone] 03/20/2019, 3:05 PM

## 2019-03-20 NOTE — H&P (Signed)
History and Physical:    Carrie [name]   QMV:[id number] DOB: 1940/11/18 DOA: 03/20/2019  Referring MD/provider: PA Valere Dross  PCP: Lucianne Lei, MD   Patient coming from: Home  Chief Complaint: Fall with wrist fracture  History of Present Illness:   Carrie [name] is an 78 y.o. 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  History is primarily per patient and ED documentation.  Patient states she fell earlier today.  She denies that she had chest pain, palpitation, dizziness.  She denies that she had loss of consciousness.  She states she "just fell".  She does not think she hit her head.  She did have wrist pain and understands that she needs to have surgery in the morning.  At present she has no acute complaints other than that she is hungry and thirsty.  She feels her pain is well controlled.  She denies any recent illnesses.  Denies any recent fevers or chills.  Denies cough.  Denies nausea vomiting or diarrhea.  Denies abdominal pain.  Denies dysuria.  ED Course:  The patient was noted to have a right wrist fracture.  She was seen by orthopedics who request [facility] admit given multiple medical problems.  ROS:   ROS   Review of Systems: As per HPI  Past Medical History:   Past Medical History:  Diagnosis Date   Anticoagulated on Coumadin    for mech valve and atrial fib  goal 2.0-2.5   Anxiety    Arthritis    "right shoulder" (03/15/2013)   Atrial fibrillation (HCC)    Atrial flutter (HCC)    CAD (coronary artery disease) 08/11/2016   Chronic combined systolic and diastolic CHF (congestive heart failure) ([place])    a. 02/2016 Echo: EF 45-50%.   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 Bi-leaflet St. Jude mechanical prosthesis; b. 02/2016 Echo: EF 45-50%, some degree of MR, sev dil LA/RA, sev TR.   NSTEMI (non-ST elevated myocardial infarction) ([place])    a. 02/2016 elev trop/Cath: nonobs dzs,    Pacemaker    medtronic adapta   Severe tricuspid regurgitation    a. 02/2016 Echo: Ef 45-50%, sev TR, PASP 75mmHg.   Sick sinus syndrome (HCC)    Dr Cristopher Peru. EP study negative. Pacemaker 12/15/06 Medtronic   Stroke [facility])    "they say I had a stroke last year" denies residual on 03/15/2013   Subdural hematoma [facility])     Past Surgical History:   Past Surgical History:  Procedure Laterality Date   APPENDECTOMY     CARDIAC CATHETERIZATION  04/02/97   R&L:severe MR/pulmonary hypertension   CARDIAC CATHETERIZATION N/A 03/16/2016   Procedure: Left Heart Cath and Coronary Angiography;  Surgeon: [name], MD;  Location: [place] CV LAB;  Service: Cardiovascular;  Laterality: N/A;   CARDIAC CATHETERIZATION N/A 09/30/2016   Procedure: Left Heart Cath and Coronary Angiography;  Surgeon: [name], MD;  Location: [place] CV LAB;  Service: Cardiovascular;  Laterality: N/A;   CARDIAC CATHETERIZATION N/A 09/30/2016   Procedure: Coronary Stent Intervention;  Surgeon: [name], MD;  3.5 x 12 mm resolute Onyx  to ostial RCA   CATARACT EXTRACTION W/ INTRAOCULAR LENS  IMPLANT, BILATERAL Bilateral 2013   CORONARY ANGIOPLASTY     DILATION AND CURETTAGE OF UTERUS     "had fibroids" (03/15/2013)   EXPLORATORY LAPAROTOMY     "had a growth on my intestines" (03/15/2013)   HAMMER TOE SURGERY Right    INSERT / REPLACE / REMOVE PACEMAKER  12/15/2006   Medtronic   IR ANGIO VERTEBRAL SEL SUBCLAVIAN INNOMINATE UNI R MOD SED  02/17/2019   IR CT HEAD LTD  02/17/2019   IR PERCUTANEOUS ART  THROMBECTOMY/INFUSION INTRACRANIAL INC DIAG ANGIO  02/17/2019   MITRAL VALVE REPLACEMENT  1998   St Jude mechanical; Dr. [name]   PACEMAKER PLACEMENT  12/15/06   medtronic adapta for SSS   PERMANENT PACEMAKER GENERATOR CHANGE N/A 07/24/2014   Procedure: PERMANENT PACEMAKER GENERATOR CHANGE;  Surgeon: [name], MD;  Location: [place] CATH LAB;  Service: Cardiovascular;  Laterality: N/A;   PERSANTINE CARDIOLITE  08/07/03   mild inf. ischemia    RADIOLOGY WITH ANESTHESIA N/A 02/17/2019   Procedure: IR WITH ANESTHESIA;  Surgeon: [name], MD;  Location: [place];  Service: Radiology;  Laterality: N/A;   RIGHT HEART CATH N/A 02/06/2019   Procedure: RIGHT HEART CATH;  Surgeon: [name], MD;  Location: [place] CV LAB;  Service: Cardiovascular;  Laterality: N/A;   TEE WITHOUT CARDIOVERSION  09/23/2011   Procedure: TRANSESOPHAGEAL ECHOCARDIOGRAM (TEE);  Surgeon: [name];  Location: [facility];  Service: Cardiovascular;  Laterality: N/A;   TONSILLECTOMY     TUBAL LIGATION     US ECHOCARDIOGRAPHY  11/19/2011   EF 50-55%,RA mod to severely dilated,LA severely dilated,trace MR,small vegetation or mass on the MV,AOV mildly scleroticmild PI, RV pressure 40-18mmHg    Social History:   Social History   Socioeconomic History   Marital status: Married    Spouse name: Not on file   Number of children: Not on file   Years of education: Not on file   Highest education level: Not on file  Occupational History   Not on file  Social Needs   Financial resource strain: Not on file   Food insecurity    Worry: Not on file    Inability: Not on file   Transportation needs    Medical: Not on file    Non-medical: Not on file  Tobacco Use   Smoking status: Never Smoker   Smokeless tobacco: Never Used  Substance and Sexual Activity   Alcohol use: No   Drug use: No   Sexual activity: Never  Lifestyle   Physical activity    Days per week: Not on file      Minutes per session: Not on file   Stress: Not on file  Relationships   Social connections    Talks on phone: Not on file    Gets together: Not on file    Attends religious service: Not on file    Active member of club or organization: Not on file    Attends meetings of clubs or organizations: Not on file    Relationship status: Not on file   Intimate partner violence    Fear of current or ex partner: Not on file    Emotionally abused: Not on file    Physically abused: Not on file    Forced sexual activity: Not on file  Other Topics Concern   Not on file  Social History Narrative  Not on file    Allergies   Codeine  Family history:   Family History  Problem Relation Age of Onset   Cancer Mother    Stroke Father    Cancer Sister    Leukemia Sister    Healthy Brother    Healthy Sister    Diabetes Sister    Healthy Brother    Healthy Brother    Heart attack Maternal Grandfather    Kidney disease Daughter    Stroke Brother     Current Medications:   Prior to Admission medications   Medication Sig Start Date End Date Taking? Authorizing Provider  acetaminophen (TYLENOL) 325 MG tablet Take 1-2 tablets (325-650 mg total) by mouth every 4 (four) hours as needed for mild pain. 03/08/19  Yes Love, Ivan Anchors, PA-C  aspirin EC 81 MG EC tablet Take 1 tablet (81 mg total) by mouth daily. 02/25/19  Yes Donzetta Starch, NP  furosemide (LASIX) 80 MG tablet TAKE 1 TABLET BY MOUTH TWICE DAILY Patient taking differently: Take 80 mg by mouth 2 (two) times daily.  03/08/19  Yes Kirsteins, Luanna Salk, MD  guaiFENesin (ROBITUSSIN) 100 MG/5ML SOLN Take 5 mLs (100 mg total) by mouth every 6 (six) hours. Patient taking differently: Take 5 mLs by mouth at bedtime as needed for cough.  02/24/19  Yes Donzetta Starch, NP  metoprolol tartrate (LOPRESSOR) 25 MG tablet TAKE 1 TABLET BY MOUTH TWICE DAILY Patient taking differently: Take 25 mg by mouth 2 (two) times daily.  03/08/19  Yes  Kirsteins, Luanna Salk, MD  Multiple Vitamin (MULTIVITAMINS PO) Take 1 tablet by mouth daily.    Yes [provider]  nitroGLYCERIN (NITROSTAT) 0.4 MG SL tablet Place 1 tablet (0.4 mg total) under the tongue every 5 (five) minutes as needed for chest pain. 02/22/19  Yes Donzetta Starch, NP  oxybutynin (DITROPAN) 5 MG tablet Take 0.5 tablets (2.5 mg total) by mouth at bedtime. 03/08/19  Yes Love, Ivan Anchors, PA-C  pantoprazole (PROTONIX) 40 MG tablet Take 1 tablet (40 mg total) by mouth daily at 12 noon. 03/08/19  Yes Love, Ivan Anchors, PA-C  polyvinyl alcohol (LIQUIFILM TEARS) 1.4 % ophthalmic solution Place 1 drop into the left eye as needed for dry eyes. 02/07/19  Yes Kayleen Memos, DO  spironolactone (ALDACTONE) 25 MG tablet Take 0.5 tablets (12.5 mg total) by mouth daily. 03/08/19  Yes Love, Ivan Anchors, PA-C  topiramate (TOPAMAX) 25 MG tablet TAKE 1 TABLET BY MOUTH TWICE DAILY Patient taking differently: Take 25 mg by mouth 2 (two) times daily.  03/08/19  Yes Kirsteins, Luanna Salk, MD  warfarin (COUMADIN) 2.5 MG tablet TAKE 1 TABLET BY MOUTH EVERY EVENING AT 6PM Patient taking differently: Take 5 mg by mouth one time only at 6 PM.  03/08/19  Yes Kirsteins, Luanna Salk, MD  albuterol (PROVENTIL) (2.5 MG/3ML) 0.083% nebulizer solution Take 3 mLs (2.5 mg total) by nebulization every 6 (six) hours as needed for wheezing or shortness of breath. Patient not taking: Reported on 03/20/2019 02/22/19   Donzetta Starch, NP  docusate (COLACE) 50 MG/5ML liquid Take 10 mLs (100 mg total) by mouth 2 (two) times daily as needed for mild constipation. Patient not taking: Reported on 03/20/2019 02/22/19   Donzetta Starch, NP    Physical Exam:   Vitals:   03/20/19 1530 03/20/19 1545 03/20/19 1600 03/20/19 1645  BP: (!) 116/59 105/66 107/62 (!) 121/59  Pulse: 74     Resp: (!) 27 Marland Kitchen)  28 19 18   Temp:      TempSrc:      SpO2: 97% 97% 99% 98%  Weight:      Height:         Physical Exam: Blood pressure (!) 121/59, pulse 74,  temperature 98.5 F (36.9 C), temperature source Oral, resp. rate 18, height 5\' 1"  (1.549 m), weight 54 kg, SpO2 98 %. Gen: Comfortable appearing patient with somewhat of a blank gaze who is able to answer yes/no questions apparently accurately. Eyes: Sclerae anicteric. Conjunctiva mildly injected. Chest: Moderately good air entry bilaterally with no adventitious sounds.  CV: Distant, irregular, 2/6 systolic murmur at apex.   Abdomen: NABS, soft, nondistended, nontender. No tenderness to light or deep palpation. No rebound, no guarding. Extremities: Forearm is splinted.  1+ edema bilaterally.   Neuro: Alert and oriented times 2, person and place.    Data Review:    Labs: Basic Metabolic Panel: Recent Labs  Lab 03/20/19 1325  NA 140  K 4.0  CL 105  CO2 24  GLUCOSE 104*  BUN 38*  CREATININE 1.46*  CALCIUM 9.9   Liver Function Tests: No results for input(s): AST, ALT, ALKPHOS, BILITOT, PROT, ALBUMIN in the last 168 hours. No results for input(s): LIPASE, AMYLASE in the last 168 hours. No results for input(s): AMMONIA in the last 168 hours. CBC: Recent Labs  Lab 03/20/19 1325  WBC 5.6  NEUTROABS 3.7  HGB 8.0*  HCT 25.8*  MCV 92.8  PLT 188   Cardiac Enzymes: No results for input(s): CKTOTAL, CKMB, CKMBINDEX, TROPONINI in the last 168 hours.  BNP (last 3 results) No results for input(s): PROBNP in the last 8760 hours. CBG: No results for input(s): GLUCAP in the last 168 hours.  Urinalysis    Component Value Date/Time   COLORURINE YELLOW 03/20/2019 1404   APPEARANCEUR HAZY (A) 03/20/2019 1404   LABSPEC 1.009 03/20/2019 1404   PHURINE 7.0 03/20/2019 1404   GLUCOSEU NEGATIVE 03/20/2019 1404   HGBUR NEGATIVE 03/20/2019 1404   BILIRUBINUR NEGATIVE 03/20/2019 1404   KETONESUR NEGATIVE 03/20/2019 1404   PROTEINUR NEGATIVE 03/20/2019 1404   UROBILINOGEN 0.2 03/15/2013 1323   NITRITE NEGATIVE 03/20/2019 1404   LEUKOCYTESUR SMALL (A) 03/20/2019 1404       Radiographic Studies: Dg Chest 1 View  Result Date: 03/20/2019 CLINICAL DATA:  Recent fall EXAM: CHEST  1 VIEW COMPARISON:  02/23/2019 FINDINGS: Cardiac shadow remains enlarged. Postsurgical changes as well as a pacing device are again seen. Mild central vascular congestion is noted stable from the prior study. Stable small right pleural effusion is noted. No focal infiltrate is seen. IMPRESSION: Stable vascular congestion and small right pleural effusion. Electronically Signed   By: Inez Catalina M.D.   On: 03/20/2019 13:12   Dg Wrist Complete Right  Result Date: 03/20/2019 CLINICAL DATA:  Recent fall with wrist pain, initial encounter EXAM: RIGHT WRIST - COMPLETE 3+ VIEW COMPARISON:  None. FINDINGS: Comminuted distal radial fracture is noted with impaction and posterior angulation at the fracture site. There is approximately 3/4 width bone displacement posteriorly. Avulsion fracture of the ulnar styloid is noted as well. There appears to be intra-articular involvement of the radial fracture. Soft tissue swelling is noted. IMPRESSION: Distal radial and ulnar fractures as described. Intra-articular involvement of the radial fracture is seen as well as posterior displacement and angulation. Electronically Signed   By: Inez Catalina M.D.   On: 03/20/2019 13:11   Ct Head Wo Contrast  Result Date: 03/20/2019 CLINICAL  DATA:  Status post fall, striking her head EXAM: CT HEAD WITHOUT CONTRAST CT CERVICAL SPINE WITHOUT CONTRAST TECHNIQUE: Multidetector CT imaging of the head and cervical spine was performed following the standard protocol without intravenous contrast. Multiplanar CT image reconstructions of the cervical spine were also generated. COMPARISON:  02/24/2019, 02/22/2019 FINDINGS: CT HEAD FINDINGS Brain: No evidence of acute infarction, acute hemorrhage, extra-axial collection, ventriculomegaly, or mass effect. 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  Generalized cerebral atrophy. Periventricular white matter low attenuation likely secondary to microangiopathy. Vascular: Cerebrovascular atherosclerotic calcifications are noted. Skull: Negative for fracture or focal lesion. Sinuses/Orbits: Visualized portions of the orbits are unremarkable. Mastoid sinuses are clear. Right maxillary sinus mucosal thickening. Mild right ethmoid sinus mucosal thickening. Other: None. CT CERVICAL SPINE FINDINGS Alignment: Normal. Skull base and vertebrae: No acute fracture. No primary bone lesion or focal pathologic process. Soft tissues and spinal canal: No prevertebral fluid or swelling. No visible canal hematoma. Disc levels: Degenerative disc disease with disc height loss C5-6. Bilateral facet arthropathy at C6-7 on the right. No foraminal stenosis. Upper chest: Lung apices are clear. Other: Thoracic aortic atherosclerosis. IMPRESSION: 1. No acute intracranial pathology. Subacute-chronic right posterior fossa subdural hematoma. 2. No acute osseous injury of the cervical spine. 3.  Aortic Atherosclerosis (ICD10-I70.0). Electronically Signed   By: Kathreen Devoid   On: 03/20/2019 13:00   Ct Cervical Spine Wo Contrast  Result Date: 03/20/2019 CLINICAL DATA:  Status post fall, striking her head EXAM: CT HEAD WITHOUT CONTRAST CT CERVICAL SPINE WITHOUT CONTRAST TECHNIQUE: Multidetector CT imaging of the head and cervical spine was performed following the standard protocol without intravenous contrast. Multiplanar CT image reconstructions of the cervical spine were also generated. COMPARISON:  02/24/2019, 02/22/2019 FINDINGS: CT HEAD FINDINGS Brain: No evidence of acute infarction, acute hemorrhage, extra-axial collection, ventriculomegaly, or mass effect. 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 Generalized cerebral atrophy. Periventricular white matter low attenuation likely secondary to microangiopathy. Vascular: Cerebrovascular atherosclerotic  calcifications are noted. Skull: Negative for fracture or focal lesion. Sinuses/Orbits: Visualized portions of the orbits are unremarkable. Mastoid sinuses are clear. Right maxillary sinus mucosal thickening. Mild right ethmoid sinus mucosal thickening. Other: None. CT CERVICAL SPINE FINDINGS Alignment: Normal. Skull base and vertebrae: No acute fracture. No primary bone lesion or focal pathologic process. Soft tissues and spinal canal: No prevertebral fluid or swelling. No visible canal hematoma. Disc levels: Degenerative disc disease with disc height loss C5-6. Bilateral facet arthropathy at C6-7 on the right. No foraminal stenosis. Upper chest: Lung apices are clear. Other: Thoracic aortic atherosclerosis. IMPRESSION: 1. No acute intracranial pathology. Subacute-chronic right posterior fossa subdural hematoma. 2. No acute osseous injury of the cervical spine. 3.  Aortic Atherosclerosis (ICD10-I70.0). Electronically Signed   By: Kathreen Devoid   On: 03/20/2019 13:00    EKG: Independently reviewed.  Atrial fibrillation at 90.  Occasional PVC.  Interventricular conduction delay.  Poor R wave progression.  Q in L, F and V4 through V6.  Axis is unclear to me.   Assessment/Plan:   Principal Problem:   Right wrist fracture Active Problems:   Chronic anticoagulation, ( INR goal 2.0-2.5 due to history of subdural hematoma and liver hemorrhage)   Longstanding persistent atrial fibrillation   Hypertensive heart disease with heart failure (HCC)   Dementia (HCC)   CAD (coronary artery disease)  78 year old female with some dementia had a fall earlier today which sounds like it was a mechanical fall to me, has a right wrist fracture.  She is admitted to [facility] for medical management of multiple medical problems pending ORIF in the morning.  He does not appear to have any acute decompensated medical problems at present.  RIGHT WRIST FRACTURE Seen by orthopedics, for ORIF in the morning N.p.o.  after midnight Heparin consult per pharmacy, hold warfarin pending surgery in the morning Hold aspirin until orthopedics feels it is okay to restart Pain is well managed per patient at present  [place] Patient is clear that she did not lose consciousness.  She had no chest pain or palpitations.  She said she just fell and was not confused, just had a hard time getting up. Further work-up warranted, but I will keep patient on telemetry given known A. fib  ANTICOAGULATED STATE On warfarin for atrial fibrillation and mechanical heart valve. Hold warfarin, heparin per pharmacy consult as noted above.  ATRIAL FIBRILLATION Rate is well controlled on metoprolol Change warfarin to heparin pending surgery in the morning.  CAD No evidence for any acute cardiac symptoms. Holding aspirin pending surgery tomorrow, this can be restarted per orthopedic discretion Continue beta-blocker.  H/O DIASTOLIC HEART DYSFUNCTION No evidence of acute decompensation of heart failure Continue spironolactone, Lasix and metoprolol at present doses  DEMENTIA Secondary to multiple strokes and subdural hematoma She is however able to answer questions appropriately when asked simple yes/no questions. She does know that she is at [facility] and that she broke her right wrist.     Other information:   DVT prophylaxis: Lovenox ordered. Code Status: Full code. Family Communication: Attempted to call patient's husband however did not pick up the phone Disposition Plan: Home Consults called: Orthopedics Admission status: Inpatient   Carrie [name] [facility]  If 7PM-7AM, please contact night-coverage [email or web address] Password [facility] 03/20/2019, 5:47 PM

## 2019-03-20 NOTE — Progress Notes (Signed)
Patient arrived to 2C10, transported by RR and 5N RN.  Pt A&Ox4.  VSS. Will continue to monitor.

## 2019-03-20 NOTE — ED Provider Notes (Signed)
[facility] EMERGENCY DEPARTMENT Provider Note   CSN: [id number] Arrival date & time: 03/20/19  1128     History   Chief Complaint Chief Complaint  Patient presents with   Fall   Wrist Injury    HPI Carrie [name] is a 78 y.o. female.     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  She reports that this morning she does not know how or why she fell but she went up on the ground.  She thinks she did hurt her head.  She is also complaining of pain in her right wrist.  She reports she had a cough her husband for him to come pick her up.  Currently she is reporting pain in her head but no other pain.  She denies any chest pain, shortness of breath, but does not remember she had dizziness or lightheadedness prior to the fall.  She reports she has chronic lower extremity swelling.  She denies any fevers, chills, cough.  Past Medical History:  Diagnosis Date   Anticoagulated on Coumadin    for mech valve and atrial fib  goal 2.0-2.5   Anxiety    Arthritis    "right shoulder" (03/15/2013)   Atrial fibrillation (HCC)    Atrial flutter (HCC)    CAD (coronary artery disease) 08/11/2016   Chronic combined systolic and diastolic CHF (congestive heart failure) ([place])    a. 02/2016 Echo: EF 45-50%.   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 Bi-leaflet St. Jude mechanical prosthesis; b. 02/2016 Echo: EF 45-50%, some degree of MR, sev dil LA/RA, sev TR.   NSTEMI (non-ST elevated myocardial infarction) ([place])    a. 02/2016  elev trop/Cath: nonobs dzs,    Pacemaker    medtronic adapta   Severe tricuspid regurgitation    a. 02/2016 Echo: Ef 45-50%, sev TR, PASP 21mmHg.   Sick sinus syndrome (HCC)    Dr Cristopher Peru. EP study negative. 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 ([place]) 32/44/0102   Embolic stroke ([place]) 72/53/6644   Acute right MCA stroke ([place])    Cerebrovascular accident (CVA) ([place])    Sleep disturbance    Acute on chronic anemia    Subtherapeutic international normalized ratio (INR) 02/22/2019   Dysphagia following cerebrovascular accident (CVA) 02/22/2019   Malnutrition ([place]) 02/22/2019   Encounter for intubation    Acute respiratory failure (HCC)    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 02/17/2019   CHF exacerbation ([place]) 02/03/2019   CHF (congestive heart failure) ([place]) 02/02/2019   SOB (shortness of breath) 02/02/2019   Diarrhea 03/31/2018   Abnormal LFTs 03/31/2018   Hemoptysis 03/30/2018   Elevated troponin 03/30/2018   CKD (chronic  kidney disease), stage III ([place]) 03/30/2018   HLD (hyperlipidemia) 03/30/2018   Blurry vision 03/30/2018   Status post coronary artery stent placement    Chronic diastolic heart failure (HCC)    History of mitral valve replacement with mechanical valve    Hypertensive heart disease with heart failure ([place])    Pure hypercholesterolemia    Coronary artery disease involving native coronary artery of native heart without angina pectoris 08/11/2016   PAH (pulmonary artery hypertension) ([place]) 05/05/2016   Anemia    AKI (acute kidney injury) ([place])    Hyponatremia 03/12/2016   Chest pain  03/11/2016   NSTEMI (non-ST elevated myocardial infarction) ([place]) 03/11/2016   Moderate to severe tricuspid regurgitation 11/15/2015   Thrombocytopenia ([place]) 12/03/2014   SSS (sick sinus syndrome) ([place]) 07/24/2014   Longstanding persistent atrial fibrillation 07/24/2014   Pacemaker 07/24/2014   Tachycardia, a fib with RVR  04/03/2013   Essential hypertension 04/03/2013   SAH (subarachnoid hemorrhage), December 2012 03/21/2012   Hemorrhage, hepatic, Rx'd with Coumadin reversal and embolisation 03/17/12 03/17/2012   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   Chronic anticoagulation, ( INR goal 2.0-2.5 due to history of subdural hematoma and liver hemorrhage) 09/09/2011   Muscle weakness of lower extremity 09/08/2011   Pacemaker - single chamber Medtronic Adapta, 2008 09/08/2011   Atrial fibrillation ([place]) 09/06/2011   S/P mitral valve replacement, St Jude 09/06/2011   CONSTIPATION 02/12/2009    Past Surgical History:  Procedure Laterality Date   APPENDECTOMY     CARDIAC CATHETERIZATION  04/02/97   R&L:severe MR/pulmonary hypertension   CARDIAC CATHETERIZATION N/A 03/16/2016   Procedure: Left Heart Cath and Coronary Angiography;  Surgeon: [name], MD;  Location: [place] CV LAB;  Service: Cardiovascular;  Laterality: N/A;   CARDIAC CATHETERIZATION N/A 09/30/2016   Procedure: Left Heart Cath and Coronary Angiography;  Surgeon: [name], MD;  Location: [place] CV LAB;  Service: Cardiovascular;  Laterality: N/A;   CARDIAC CATHETERIZATION N/A 09/30/2016   Procedure: Coronary Stent Intervention;  Surgeon: [name], MD;  3.5 x 12 mm resolute Onyx  to ostial RCA   CATARACT EXTRACTION W/ INTRAOCULAR LENS  IMPLANT, BILATERAL Bilateral 2013   CORONARY ANGIOPLASTY     DILATION AND CURETTAGE OF UTERUS     "had fibroids" (03/15/2013)   EXPLORATORY LAPAROTOMY     "had a growth on my intestines" (03/15/2013)   HAMMER TOE  SURGERY Right    INSERT / REPLACE / REMOVE PACEMAKER  12/15/2006   Medtronic   IR ANGIO VERTEBRAL SEL SUBCLAVIAN INNOMINATE UNI R MOD SED  02/17/2019   IR CT HEAD LTD  02/17/2019   IR PERCUTANEOUS ART THROMBECTOMY/INFUSION INTRACRANIAL INC DIAG ANGIO  02/17/2019   MITRAL VALVE REPLACEMENT  1998   St Jude mechanical; Dr. [name]   PACEMAKER PLACEMENT  12/15/06   medtronic adapta for SSS   PERMANENT PACEMAKER GENERATOR CHANGE N/A 07/24/2014   Procedure: PERMANENT PACEMAKER GENERATOR CHANGE;  Surgeon: [name], MD;  Location: [place] CATH LAB;  Service: Cardiovascular;  Laterality: N/A;   PERSANTINE CARDIOLITE  08/07/03   mild inf. ischemia    RADIOLOGY WITH ANESTHESIA N/A 02/17/2019   Procedure: IR WITH ANESTHESIA;  Surgeon: [name], MD;  Location: [place];  Service: Radiology;  Laterality: N/A;   RIGHT HEART CATH N/A 02/06/2019   Procedure: RIGHT HEART CATH;  Surgeon: [name], MD;  Location: [place] CV LAB;  Service: Cardiovascular;  Laterality: N/A;   TEE WITHOUT CARDIOVERSION  09/23/2011   Procedure: TRANSESOPHAGEAL ECHOCARDIOGRAM (TEE);  Surgeon: [name];  Location: [facility] END[facility];  Service: Cardiovascular;  Laterality: N/A;   TONSILLECTOMY     TUBAL LIGATION     US ECHOCARDIOGRAPHY  11/19/2011   EF 50-55%,RA mod to severely dilated,LA severely dilated,trace MR,small vegetation or mass on the MV,AOV mildly scleroticmild PI, RV pressure 40-80mmHg     OB History   No obstetric history on file.      Home Medications    Prior to Admission medications   Medication Sig Start Date End Date Taking? Authorizing Provider  acetaminophen (TYLENOL) 325 MG tablet Take 1-2 tablets (325-650 mg total) by mouth every 4 (four) hours as needed for mild pain. 03/08/19  Yes Love, Ivan Anchors, PA-C  aspirin EC 81 MG EC tablet Take 1 tablet (81 mg total) by mouth daily. 02/25/19  Yes Donzetta Starch, NP  furosemide (LASIX) 80 MG tablet TAKE 1 TABLET BY MOUTH TWICE  DAILY Patient taking differently: Take 80 mg by mouth 2 (two) times daily.  03/08/19  Yes Kirsteins, Luanna Salk, MD  guaiFENesin (ROBITUSSIN) 100 MG/5ML SOLN Take 5 mLs (100 mg total) by mouth every 6 (six) hours. Patient taking differently: Take 5 mLs by mouth at bedtime as needed for cough.  02/24/19  Yes Donzetta Starch, NP  metoprolol tartrate (LOPRESSOR) 25 MG tablet TAKE 1 TABLET BY MOUTH TWICE DAILY Patient taking differently: Take 25 mg by mouth 2 (two) times daily.  03/08/19  Yes Kirsteins, Luanna Salk, MD  Multiple Vitamin (MULTIVITAMINS PO) Take 1 tablet by mouth daily.    Yes [provider]  nitroGLYCERIN (NITROSTAT) 0.4 MG SL tablet Place 1 tablet (0.4 mg total) under the tongue every 5 (five) minutes as needed for chest pain. 02/22/19  Yes Donzetta Starch, NP  oxybutynin (DITROPAN) 5 MG tablet Take 0.5 tablets (2.5 mg total) by mouth at bedtime. 03/08/19  Yes Love, Ivan Anchors, PA-C  pantoprazole (PROTONIX) 40 MG tablet Take 1 tablet (40 mg total) by mouth daily at 12 noon. 03/08/19  Yes Love, Ivan Anchors, PA-C  polyvinyl alcohol (LIQUIFILM TEARS) 1.4 % ophthalmic solution Place 1 drop into the left eye as needed for dry eyes. 02/07/19  Yes Kayleen Memos, DO  spironolactone (ALDACTONE) 25 MG tablet Take 0.5 tablets (12.5 mg total) by mouth daily. 03/08/19  Yes Love, Ivan Anchors, PA-C  topiramate (TOPAMAX) 25 MG tablet TAKE 1 TABLET BY MOUTH TWICE DAILY Patient taking differently: Take 25 mg by mouth 2 (two) times daily.  03/08/19  Yes Kirsteins, Luanna Salk, MD  warfarin (COUMADIN) 2.5 MG tablet TAKE 1 TABLET BY MOUTH EVERY EVENING AT 6PM Patient taking differently: Take 5 mg by mouth one time only at 6 PM.  03/08/19  Yes Kirsteins, Luanna Salk, MD  albuterol (PROVENTIL) (2.5 MG/3ML) 0.083% nebulizer solution Take 3 mLs (2.5 mg total) by nebulization every 6 (six) hours as needed for wheezing or shortness of breath. Patient not taking: Reported on 03/20/2019 02/22/19   Donzetta Starch, NP  docusate (COLACE) 50  MG/5ML liquid Take 10 mLs (100 mg total) by mouth 2 (two) times daily as needed for mild constipation. Patient not taking: Reported on 03/20/2019 02/22/19   Donzetta Starch, NP    Family History Family History  Problem Relation Age of Onset   Cancer Mother    Stroke Father    Cancer Sister    Leukemia Sister    Healthy Brother    Healthy Sister  Diabetes Sister    Healthy Brother    Healthy Brother    Heart attack Maternal Grandfather    Kidney disease Daughter    Stroke Brother     Social History Social History   Tobacco Use   Smoking status: Never Smoker   Smokeless tobacco: Never Used  Substance Use Topics   Alcohol use: No   Drug use: No     Allergies   Codeine   Review of Systems Review of Systems  Constitutional: Negative for chills and fever.  HENT: Negative for congestion and sore throat.   Respiratory: Negative for cough, chest tightness and shortness of breath.   Cardiovascular: Positive for leg swelling. Negative for chest pain and palpitations.  Gastrointestinal: Negative for abdominal pain, nausea and vomiting.  Genitourinary: Negative for dysuria and flank pain.  Musculoskeletal: Positive for arthralgias. Negative for back pain and myalgias.  Skin: Negative for rash.  Neurological: Positive for headaches.   Unable to complete remainder of ROS due to dementia.  Physical Exam Updated Vital Signs BP 113/65    Pulse 85    Temp 98.5 F (36.9 C) (Oral)    Resp (!) 25    Ht 5\' 1"  (1.549 m)    Wt 54 kg    SpO2 98%    BMI 22.48 kg/m   Physical Exam Vitals signs and nursing note reviewed.  Constitutional:      General: She is not in acute distress.    Appearance: She is well-developed. She is not ill-appearing or diaphoretic.     Comments: Follow commands and answers questions appropriately.  HENT:     Head: Normocephalic and atraumatic.     Mouth/Throat:     Mouth: Mucous membranes are moist.  Eyes:     Conjunctiva/sclera:  Conjunctivae normal.     Pupils: Pupils are equal, round, and reactive to light.  Neck:     Musculoskeletal: Normal range of motion and neck supple.  Cardiovascular:     Rate and Rhythm: Normal rate and regular rhythm.     Heart sounds: S1 normal and S2 normal. Murmur present.  Pulmonary:     Effort: Pulmonary effort is normal.     Breath sounds: Rales present. No wheezing.     Comments: Diminished lung sounds right lung base.  Abdominal:     General: There is no distension.     Palpations: Abdomen is soft.     Tenderness: There is no abdominal tenderness. There is no guarding.  Musculoskeletal: Normal range of motion.        General: No deformity.     Right lower leg: Edema present.     Left lower leg: Edema present.     Comments: Symmetric and b/l pitting LE edema.  Lymphadenopathy:     Cervical: No cervical adenopathy.  Skin:    General: Skin is warm and dry.     Findings: No erythema or rash.  Neurological:     Mental Status: She is alert.     Comments: Cranial nerves grossly intact. Patient moves extremities symmetrically and with good coordination.  Psychiatric:        Behavior: Behavior normal.        Thought Content: Thought content normal.        Judgment: Judgment normal.      ED Treatments / Results  Labs (all labs ordered are listed, but only abnormal results are displayed) Labs Reviewed  URINALYSIS, ROUTINE W REFLEX MICROSCOPIC - Abnormal; Notable for the following  components:      Result Value   APPearance HAZY (*)    Leukocytes,Ua SMALL (*)    Bacteria, UA RARE (*)    All other components within normal limits  PROTIME-INR - Abnormal; Notable for the following components:   Prothrombin Time 20.8 (*)    INR 1.8 (*)    All other components within normal limits  URINE CULTURE  CBC WITH DIFFERENTIAL/PLATELET  BASIC METABOLIC PANEL  TROPONIN I (HIGH SENSITIVITY)  TROPONIN I (HIGH SENSITIVITY)  BRAIN NATRIURETIC PEPTIDE    EKG      Radiology Dg  Chest 1 View  Result Date: 03/20/2019 CLINICAL DATA:  Recent fall EXAM: CHEST  1 VIEW COMPARISON:  02/23/2019 FINDINGS: Cardiac shadow remains enlarged. Postsurgical changes as well as a pacing device are again seen. Mild central vascular congestion is noted stable from the prior study. Stable small right pleural effusion is noted. No focal infiltrate is seen. IMPRESSION: Stable vascular congestion and small right pleural effusion. Electronically Signed   By: Inez Catalina M.D.   On: 03/20/2019 13:12   Dg Wrist Complete Right  Result Date: 03/20/2019 CLINICAL DATA:  Recent fall with wrist pain, initial encounter EXAM: RIGHT WRIST - COMPLETE 3+ VIEW COMPARISON:  None. FINDINGS: Comminuted distal radial fracture is noted with impaction and posterior angulation at the fracture site. There is approximately 3/4 width bone displacement posteriorly. Avulsion fracture of the ulnar styloid is noted as well. There appears to be intra-articular involvement of the radial fracture. Soft tissue swelling is noted. IMPRESSION: Distal radial and ulnar fractures as described. Intra-articular involvement of the radial fracture is seen as well as posterior displacement and angulation. Electronically Signed   By: Inez Catalina M.D.   On: 03/20/2019 13:11   Ct Head Wo Contrast  Result Date: 03/20/2019 CLINICAL DATA:  Status post fall, striking her head EXAM: CT HEAD WITHOUT CONTRAST CT CERVICAL SPINE WITHOUT CONTRAST TECHNIQUE: Multidetector CT imaging of the head and cervical spine was performed following the standard protocol without intravenous contrast. Multiplanar CT image reconstructions of the cervical spine were also generated. COMPARISON:  02/24/2019, 02/22/2019 FINDINGS: CT HEAD FINDINGS Brain: No evidence of acute infarction, acute hemorrhage, extra-axial collection, ventriculomegaly, or mass effect. 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 Generalized cerebral atrophy. Periventricular  white matter low attenuation likely secondary to microangiopathy. Vascular: Cerebrovascular atherosclerotic calcifications are noted. Skull: Negative for fracture or focal lesion. Sinuses/Orbits: Visualized portions of the orbits are unremarkable. Mastoid sinuses are clear. Right maxillary sinus mucosal thickening. Mild right ethmoid sinus mucosal thickening. Other: None. CT CERVICAL SPINE FINDINGS Alignment: Normal. Skull base and vertebrae: No acute fracture. No primary bone lesion or focal pathologic process. Soft tissues and spinal canal: No prevertebral fluid or swelling. No visible canal hematoma. Disc levels: Degenerative disc disease with disc height loss C5-6. Bilateral facet arthropathy at C6-7 on the right. No foraminal stenosis. Upper chest: Lung apices are clear. Other: Thoracic aortic atherosclerosis. IMPRESSION: 1. No acute intracranial pathology. Subacute-chronic right posterior fossa subdural hematoma. 2. No acute osseous injury of the cervical spine. 3.  Aortic Atherosclerosis (ICD10-I70.0). Electronically Signed   By: Kathreen Devoid   On: 03/20/2019 13:00   Ct Cervical Spine Wo Contrast  Result Date: 03/20/2019 CLINICAL DATA:  Status post fall, striking her head EXAM: CT HEAD WITHOUT CONTRAST CT CERVICAL SPINE WITHOUT CONTRAST TECHNIQUE: Multidetector CT imaging of the head and cervical spine was performed following the standard protocol without intravenous contrast. Multiplanar CT image reconstructions of the  cervical spine were also generated. COMPARISON:  02/24/2019, 02/22/2019 FINDINGS: CT HEAD FINDINGS Brain: No evidence of acute infarction, acute hemorrhage, extra-axial collection, ventriculomegaly, or mass effect. 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 Generalized cerebral atrophy. Periventricular white matter low attenuation likely secondary to microangiopathy. Vascular: Cerebrovascular atherosclerotic calcifications are noted. Skull: Negative for  fracture or focal lesion. Sinuses/Orbits: Visualized portions of the orbits are unremarkable. Mastoid sinuses are clear. Right maxillary sinus mucosal thickening. Mild right ethmoid sinus mucosal thickening. Other: None. CT CERVICAL SPINE FINDINGS Alignment: Normal. Skull base and vertebrae: No acute fracture. No primary bone lesion or focal pathologic process. Soft tissues and spinal canal: No prevertebral fluid or swelling. No visible canal hematoma. Disc levels: Degenerative disc disease with disc height loss C5-6. Bilateral facet arthropathy at C6-7 on the right. No foraminal stenosis. Upper chest: Lung apices are clear. Other: Thoracic aortic atherosclerosis. IMPRESSION: 1. No acute intracranial pathology. Subacute-chronic right posterior fossa subdural hematoma. 2. No acute osseous injury of the cervical spine. 3.  Aortic Atherosclerosis (ICD10-I70.0). Electronically Signed   By: Kathreen Devoid   On: 03/20/2019 13:00    Procedures Procedures (including critical care time)  Medications Ordered in ED Medications  morphine 2 MG/ML injection 2 mg (2 mg Intravenous Given 03/20/19 1418)     Initial Impression / Assessment and Plan / ED Course  I have reviewed the triage vital signs and the nursing notes.  Pertinent labs & imaging results that were available during my care of the patient were reviewed by me and considered in my medical decision making (see chart for details).  Clinical Course as of Mar 20 1603  Mon Mar 20, 2019  1221 Talked with CT-pt placed as higher priority due to history of bleeding. They will come and get her next.    [AM]  2409 Will call hand surgery.   DG Wrist Complete Right [AM]  7353 Spoke with Hilbert Odor PA who will come see the patient.    [AM]  2992 Subtherapeutic.   INR(!): 1.8 [AM]  1434 Updated pt's husband.    [AM]  4268 Appears at baseline.   B Natriuretic Peptide(!): 2,182.8 [AM]  3419 Spoke with Dr. [name] who will admit patient. Appreciate her  involvement.    [AM]    Clinical Course User Index [AM] Albesa Seen, PA-C       This is a medically complex 78 year old female presenting for fall.  Also question syncope given that patient cannot remember why or how she fell to the ground.  She thinks she hit her head.  Her husband was contacted and he does not think that the patient syncopized and thought he tripped over her feet.  Work-up demonstrating distal radius and ulna fracture that is angulated and dorsally displaced.  INR subtherapeutic at 1.8 today.  Patient's chronic subdural hematoma is stable on CT scan.  No acute cervical spine abnormality.  EKG in atrial fibrillation, but with no acute changes from prior.  Hand surgery was consulted who will come to evaluate the patient.  Spoke with Silvestre Gunner, PA-C who states that Dr. Lenon Curt surgically correct the fracture tomorrow.  PA Hilbert Odor to reduce in ER with hematoma block. Will admit to medicine.   Patient to be admitted to [facility] hospitalist per Dr. [name]. Appreciate her involvement.   This is a shared visit with Dr. Nat Christen. Patient was independently evaluated by this attending physician. Attending physician consulted in evaluation and management.  Final Clinical Impressions(s) / ED  Diagnoses   Final diagnoses:  Other closed intra-articular fracture of distal end of right radius, initial encounter  Closed fracture of distal end of right ulna, unspecified fracture morphology, initial encounter  Fall, initial encounter    ED Discharge Orders    None       Tamala Julian 03/20/19 1606    Nat Christen, MD 03/27/19 [phone]

## 2019-03-20 NOTE — Telephone Encounter (Signed)
F/U Message            Amy is calling again to get clarification on the medication that the patient is suppose to be taking but she is not. Pls call Amy at [phone]

## 2019-03-21 ENCOUNTER — Telehealth: Payer: Self-pay | Admitting: *Deleted

## 2019-03-21 ENCOUNTER — Inpatient Hospital Stay (HOSPITAL_COMMUNITY): Payer: Medicare Other

## 2019-03-21 ENCOUNTER — Encounter: Payer: Medicare Other | Admitting: Registered Nurse

## 2019-03-21 ENCOUNTER — Encounter (HOSPITAL_COMMUNITY)
Admission: EM | Disposition: A | Discharge status: Skilled Nursing Facility | Payer: Self-pay | Source: Home / Self Care | Attending: Internal Medicine

## 2019-03-21 DIAGNOSIS — I2721 Secondary pulmonary arterial hypertension: Secondary | ICD-10-CM

## 2019-03-21 DIAGNOSIS — F0150 Vascular dementia without behavioral disturbance: Secondary | ICD-10-CM

## 2019-03-21 DIAGNOSIS — I63411 Cerebral infarction due to embolism of right middle cerebral artery: Secondary | ICD-10-CM

## 2019-03-21 DIAGNOSIS — I495 Sick sinus syndrome: Secondary | ICD-10-CM

## 2019-03-21 DIAGNOSIS — I214 Non-ST elevation (NSTEMI) myocardial infarction: Secondary | ICD-10-CM

## 2019-03-21 DIAGNOSIS — I361 Nonrheumatic tricuspid (valve) insufficiency: Secondary | ICD-10-CM

## 2019-03-21 DIAGNOSIS — Z952 Presence of prosthetic heart valve: Secondary | ICD-10-CM

## 2019-03-21 DIAGNOSIS — I5032 Chronic diastolic (congestive) heart failure: Secondary | ICD-10-CM

## 2019-03-21 DIAGNOSIS — S52601A Unspecified fracture of lower end of right ulna, initial encounter for closed fracture: Secondary | ICD-10-CM

## 2019-03-21 DIAGNOSIS — W19XXXA Unspecified fall, initial encounter: Secondary | ICD-10-CM

## 2019-03-21 DIAGNOSIS — I10 Essential (primary) hypertension: Secondary | ICD-10-CM

## 2019-03-21 DIAGNOSIS — N183 Chronic kidney disease, stage 3 (moderate): Secondary | ICD-10-CM

## 2019-03-21 DIAGNOSIS — Z950 Presence of cardiac pacemaker: Secondary | ICD-10-CM

## 2019-03-21 DIAGNOSIS — I480 Paroxysmal atrial fibrillation: Secondary | ICD-10-CM

## 2019-03-21 LAB — ECHOCARDIOGRAM LIMITED
Height: 61 in
Weight: 1952 oz

## 2019-03-21 LAB — BASIC METABOLIC PANEL
Anion gap: 9 (ref 5–15)
BUN: 35 mg/dL — ABNORMAL HIGH (ref 8–23)
CO2: 24 mmol/L (ref 22–32)
Calcium: 9.5 mg/dL (ref 8.9–10.3)
Chloride: 104 mmol/L (ref 98–111)
Creatinine, Ser: 1.34 mg/dL — ABNORMAL HIGH (ref 0.44–1.00)
GFR calc Af Amer: 44 mL/min — ABNORMAL LOW (ref >60)
GFR calc non Af Amer: 38 mL/min — ABNORMAL LOW (ref >60)
Glucose, Bld: 116 mg/dL — ABNORMAL HIGH (ref 70–99)
Potassium: 3.8 mmol/L (ref 3.5–5.1)
Sodium: 137 mmol/L (ref 135–145)

## 2019-03-21 LAB — CBC
HCT: 24.1 % — ABNORMAL LOW (ref 36.0–46.0)
Hemoglobin: 8 g/dL — ABNORMAL LOW (ref 12.0–15.0)
MCH: 30.3 pg (ref 26.0–34.0)
MCHC: 33.2 g/dL (ref 30.0–36.0)
MCV: 91.3 fL (ref 80.0–100.0)
Platelets: 174 10*3/uL (ref 150–400)
RBC: 2.64 MIL/uL — ABNORMAL LOW (ref 3.87–5.11)
RDW: 33.6 % — ABNORMAL HIGH (ref 11.5–15.5)
WBC: 6.3 10*3/uL (ref 4.0–10.5)
nRBC: 0.3 % — ABNORMAL HIGH (ref 0.0–0.2)

## 2019-03-21 LAB — PROTIME-INR
INR: 2.3 — ABNORMAL HIGH (ref 0.8–1.2)
Prothrombin Time: 25.1 seconds — ABNORMAL HIGH (ref 11.4–15.2)

## 2019-03-21 LAB — HEPARIN LEVEL (UNFRACTIONATED)
Heparin Unfractionated: 1.07 IU/mL — ABNORMAL HIGH (ref 0.30–0.70)
Heparin Unfractionated: 0.3 IU/mL (ref 0.30–0.70)

## 2019-03-21 LAB — MRSA PCR SCREENING: MRSA by PCR: NEGATIVE

## 2019-03-21 LAB — APTT: aPTT: >200 seconds (ref 24–36)

## 2019-03-21 LAB — PATHOLOGIST SMEAR REVIEW

## 2019-03-21 SURGERY — OPEN REDUCTION INTERNAL FIXATION (ORIF) WRIST FRACTURE
Anesthesia: Choice | Laterality: Right

## 2019-03-21 MED ORDER — HEPARIN (PORCINE) 25000 UT/250ML-% IV SOLN
500.0000 [IU]/h | INTRAVENOUS | Status: DC
  Administered 2019-03-21: 500 [IU]/h via INTRAVENOUS
  Filled 2019-03-21: qty 250

## 2019-03-21 MED ORDER — VITAMIN K1 10 MG/ML IJ SOLN
3.0000 mg | Freq: Once | INTRAVENOUS | Status: AC
  Administered 2019-03-21: 3 mg via INTRAVENOUS
  Filled 2019-03-21: qty 0.3

## 2019-03-21 MED ORDER — HEPARIN SODIUM (PORCINE) 5000 UNIT/ML IJ SOLN
5000.0000 [IU] | Freq: Three times a day (TID) | INTRAMUSCULAR | Status: AC
  Administered 2019-03-21 (×2): 5000 [IU] via SUBCUTANEOUS
  Filled 2019-03-21 (×2): qty 1

## 2019-03-21 NOTE — Telephone Encounter (Signed)
Call placed to Amy, home health [phone] notified. She will make sure that she gets PT/OT.

## 2019-03-21 NOTE — Telephone Encounter (Signed)
Pt admitted [facility] with wrist fracture currently in surgery

## 2019-03-21 NOTE — Progress Notes (Signed)
[place] for Heparin Indication: Mechanical heart valve and atrial fibrillation  Allergies  Allergen Reactions  . Codeine Nausea And Vomiting    Patient Measurements: Height: 5\' 1"  (154.9 cm) Weight: 119 lb (54 kg) IBW/kg (Calculated) : 47.8 Heparin Dosing Weight: 54 kg  Vital Signs: Temp: 97.7 F (36.5 C) (06/30 0300) Temp Source: Oral (06/30 0300) BP: 135/87 (06/30 0300) Pulse Rate: 88 (06/30 0300)  Labs: Recent Labs    03/20/19 1325 03/20/19 1418 03/21/19 0125  HGB 8.0*  --  8.0*  HCT 25.8*  --  24.1*  PLT 188  --  174  APTT  --   --  >200*  LABPROT 20.8*  --  25.1*  INR 1.8*  --  2.3*  HEPARINUNFRC  --   --  1.07*  CREATININE 1.46*  --  1.34*  TROPONINIHS 39* 40*  --     Estimated Creatinine Clearance: 26.5 mL/min (A) (by C-G formula based on SCr of 1.34 mg/dL (H)).  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  Pharmacy is consulted to start a heparin drip for the mechanical valve and atrial fibrillation.  Heparin bag was infused accidentally at one time.  Protamine 50 mg given IV.  Heparin level drawn this am 1.07 units/ml.  INR 2.3  No bleeding per RN  Goal of Therapy:  Heparin level 0.3-0.7 units/ml Monitor platelets by anticoagulation protocol: Yes   Plan:  Continue to hold heparin Check heparin level in 4 hours  Thanks for allowing pharmacy to be a part of this patient's care.  Excell Seltzer, PharmD Clinical Pharmacist

## 2019-03-21 NOTE — Progress Notes (Signed)
Sister, Diane, called.  She is frustrated that patient has not given permission to share her info and HIPAA prevents sharing of information over the phone.  Diane was told that we would have to contact the patient's husband (and/or legal guardian?) for permission for authority.

## 2019-03-21 NOTE — Progress Notes (Signed)
CRITICAL VALUE ALERT  Critical Value:  PTT >200  Date & Time Notified:  03/21/19 at 0355  Provider Notified: Santiago Glad, [facility]  Orders Received/Actions taken: No new orders.  Continue to monitor patient.

## 2019-03-21 NOTE — Evaluation (Signed)
Physical Therapy Evaluation Patient Details Name: Carrie [name] MRN: [id number] DOB: Jan 20, 1941 Today's Date: 03/21/2019   History of Present Illness  78 y.o. 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  Admitted on 03/20/19 following a fall and was found to have R wrist fx. Scheduled for surgery 6/30 currently postponed.   Clinical Impression  PTA pt living with husband in multistory home, with stair lift and 3 steps to enter, bedroom and full bath upstairs. Pt reports using RW but not using it when she fell in the hallway at home. Pt report husband does cooking and cleaning. Per prior hospitalization documentation pt was living on 1st floor taking bird baths. Pt is very flat with increased difficulty stating her birth date, and recalling home set up without prompting. Pt is limited in safe mobility by R wrist NWB, orthostatics and A fib as well as decreased strength and balance. Pt requires modA for bed mobility and sit>stand and maxA to pivot to chair. PT recommends SNF level rehab at discharge to improve strength and balance prior to going home. PT will continue to follow acutely.  Orthostatic BPs  Supine 126/60  Sitting 120/56  After pivot to chair 96/65  With sitting 3 min 120/61       Follow Up Recommendations SNF    Equipment Recommendations  Other (comment)(TBD at next venue)    Recommendations for Other Services OT consult     Precautions / Restrictions Precautions Precautions: Fall Restrictions Weight Bearing Restrictions: Yes RUE Weight Bearing: Non weight bearing      Mobility  Bed Mobility Overal bed mobility: Needs Assistance Bed Mobility: Supine to Sit     Supine to sit: Mod assist     General bed mobility comments:  modA for coming to EoB due to inablity to utilize R UE, assist with pad scoot of hips  Transfers Overall transfer level: Needs assistance Equipment used: 1 person hand held assist Transfers: Sit to/from [facility] Sit to Stand: Mod assist Stand pivot transfers: Max assist       General transfer comment: modA for power up with therapist, pt unable to take steps with maximal support and verbal and tactile cuing, maxA for pivot to recliner, pt found to be orthostatic with sitting        Balance Overall balance assessment: Needs assistance Sitting-balance support: No upper extremity supported;Feet supported Sitting balance-Leahy Scale: Fair     Standing balance support: Bilateral upper extremity supported Standing balance-Leahy Scale: Zero                               Pertinent Vitals/Pain Pain Assessment: Faces Faces Pain Scale: Hurts whole lot Pain Location: R wrist Pain Descriptors / Indicators: Guarding;Grimacing Pain Intervention(s): Limited activity within patient's tolerance;Monitored during session;Repositioned    Home Living Family/patient expects to be discharged to:: Private residence Living Arrangements: Spouse/significant other Available Help at Discharge: Family;Available 24 hours/day Type of Home: House Home Access: Stairs to enter Entrance Stairs-Rails: Can reach both Entrance Stairs-Number of Steps: 2 Home Layout: Multi-level;1/2 bath on main level Home Equipment: Cane - single point;Walker - 4 wheels;Bedside commode;Grab bars - tub/shower      Prior Function Level of Independence: Needs assistance   Gait / Transfers Assistance Needed: Utilizes RW, but was not  using when she fell  ADL's / Homemaking Assistance Needed: husband assists with bathing, dressing and does all iADLs        Hand Dominance        Extremity/Trunk Assessment   Upper Extremity Assessment Upper Extremity Assessment: RUE deficits/detail RUE  Deficits / Details: R wrist fx RUE: Unable to fully assess due to immobilization;Unable to fully assess due to pain    Lower Extremity Assessment Lower Extremity Assessment: Generalized weakness;Difficult to assess due to impaired cognition    Cervical / Trunk Assessment Cervical / Trunk Assessment: Kyphotic  Communication   Communication: HOH  Cognition Arousal/Alertness: Awake/alert Behavior During Therapy: Flat affect Overall Cognitive Status: Impaired/Different from baseline Area of Impairment: Following commands;Safety/judgement;Awareness;Problem solving                       Following Commands: Follows one step commands with increased time;Follows multi-step commands with increased time Safety/Judgement: Decreased awareness of safety Awareness: Emergent Problem Solving: Slow processing;Decreased initiation;Difficulty sequencing;Requires verbal cues;Requires tactile cues General Comments: very slow processing, difficulty       General Comments General comments (skin integrity, edema, etc.): HR range from 66-108 bpm during session         Assessment/Plan    PT Assessment Patient needs continued PT services  PT Problem List Decreased strength;Decreased activity tolerance;Decreased range of motion;Decreased balance;Decreased mobility;Decreased coordination;Decreased cognition;Decreased safety awareness;Cardiopulmonary status limiting activity;Pain       PT Treatment Interventions DME instruction;Gait training;Functional mobility training;Therapeutic activities;Therapeutic exercise;Balance training;Cognitive remediation;Patient/family education    PT Goals (Current goals can be found in the Care Plan section)  Acute Rehab PT Goals Patient Stated Goal: none stated PT Goal Formulation: Patient unable to participate in goal setting Time For Goal Achievement: 04/04/19 Potential to Achieve Goals: Fair    Frequency Min 2X/week    AM-PAC PT "6 Clicks" Mobility   Outcome Measure Help needed turning from your back to your side while in a flat bed without using bedrails?: A Little Help needed moving from lying on your back to sitting on the side of a flat bed without using bedrails?: A Lot Help needed moving to and from a bed to a chair (including a wheelchair)?: A Lot Help needed standing up from a chair using your arms (e.g., wheelchair or bedside chair)?: A Lot Help needed to walk in hospital room?: A Lot Help needed climbing 3-5 steps with a railing? : Total 6 Click Score: 12    End of Session Equipment Utilized During Treatment: Gait belt Activity Tolerance: Treatment limited secondary to medical complications (Comment)(orthostatics) Patient left: in chair;with call bell/phone within reach;Other (comment)(chair alarm pad placed, no box in room RN notified) Nurse Communication: Mobility status;Other (comment)(need for assist with eating as pt is R handed) PT Visit Diagnosis: Unsteadiness on feet (R26.81);Other abnormalities of gait and mobility (R26.89);Muscle weakness (generalized) (M62.81);Difficulty in walking, not elsewhere classified (R26.2);Dizziness and giddiness (R42);History of falling (Z91.81);Repeated falls (R29.6);Pain Pain - Right/Left: Right Pain - part of body: (wrist)    Time: 8502-7741 PT Time Calculation (min) (ACUTE ONLY): 22 min   Charges:   PT Evaluation $PT Eval Moderate Complexity: 1 Mod          Rylyn Zawistowski B. Migdalia Dk PT, DPT Acute Rehabilitation Services Pager [phone] Office [phone]   [place] 03/21/2019, 2:15 PM

## 2019-03-21 NOTE — Progress Notes (Signed)
ANTICOAGULATION CONSULT NOTE - Initial Consult  Pharmacy Consult for heparin Indication: mechanical mitral valve, atrial fibrillation  Allergies  Allergen Reactions  . Codeine Nausea And Vomiting    Patient Measurements: Height: 5\' 1"  (154.9 cm) Weight: 122 lb (55.3 kg) IBW/kg (Calculated) : 47.8 Heparin Dosing Weight: 54kg  Vital Signs: Temp: 98.4 F (36.9 C) (06/30 0744) Temp Source: Oral (06/30 0744) BP: 120/53 (06/30 0744) Pulse Rate: 86 (06/30 0744)  Labs: Recent Labs    03/20/19 1325 03/20/19 1418 03/21/19 0125 03/21/19 0702  HGB 8.0*  --  8.0*  --   HCT 25.8*  --  24.1*  --   PLT 188  --  174  --   APTT  --   --  >200*  --   LABPROT 20.8*  --  25.1*  --   INR 1.8*  --  2.3*  --   HEPARINUNFRC  --   --  1.07* 0.30  CREATININE 1.46*  --  1.34*  --   TROPONINIHS 39* 40*  --   --     Estimated Creatinine Clearance: 26.5 mL/min (A) (by C-G formula based on SCr of 1.34 mg/dL (H)).   Medical History: Past Medical History:  Diagnosis Date  . Anticoagulated on Coumadin    for mech valve and atrial fib  goal 2.0-2.5  . Anxiety   . Arthritis    "right shoulder" (03/15/2013)  . Atrial fibrillation ([place])   . Atrial flutter ([place])   . CAD (coronary artery disease) 08/11/2016  . Chronic combined systolic and diastolic CHF (congestive heart failure) ([place])    a. 02/2016 Echo: EF 45-50%.  . Diverticula of colon   . GERD (gastroesophageal reflux disease)   . Hemorrhage intraabdominal 03/17/2012  . Hyperlipidemia   . Hypertension   . Liver hemorrhage   . Migraines   . 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 Bi-leaflet St. Jude mechanical prosthesis; b. 02/2016 Echo: EF 45-50%, some degree of MR, sev dil LA/RA, sev TR.  . NSTEMI (non-ST elevated myocardial infarction) ([place])    a. 02/2016 elev trop/Cath: nonobs dzs,   . Pacemaker    medtronic adapta  . Severe tricuspid regurgitation    a. 02/2016 Echo: Ef 45-50%, sev TR, PASP 33mmHg.  . Sick sinus  syndrome ([place])    Dr Cristopher Peru. EP study negative. Pacemaker 12/15/06 Medtronic  . Stroke [facility])    "they say I had a stroke last year" denies residual on 03/15/2013  . Subdural hematoma ([facility])     Assessment: 32 yoF on warfarin at home for hx of AFib and mechanical MVR. INR goal previously 2-2.5 with hx of SAH, but was adjusted by anticoagulation clinic to 2.5-3 on 03/16/19. Pt admitted s/p fall with INR 1.8 and plans for surgery today. Pt started on heparin drip and was inadvertently given overdose by nursing with pump infusion error. Pt received protamine 50mg  x1 overnight and heparin level has now trended down to 0.30 at 0700. INR was 2.3 this morning, pt now s/p vitamin K IV x1.  Goal of Therapy:  Heparin level 0.3-0.5 units/ml Monitor platelets by anticoagulation protocol: Yes   Plan:  -Restart heparin at 500 units/hr -Check heparin level in 4hr   Arrie Senate, PharmD, BCPS Clinical Pharmacist [phone] Please check AMION for all [place] numbers 03/21/2019

## 2019-03-21 NOTE — Progress Notes (Signed)
Had short runs of v-tach, asymptomatic. Continue to monitor.

## 2019-03-21 NOTE — Progress Notes (Signed)
  Echocardiogram 2D Echocardiogram has been performed.  Carrie [name] 03/21/2019, 12:05 PM

## 2019-03-21 NOTE — Telephone Encounter (Signed)
Left message for patient to call and schedule appointment this week (per message from Dr. Saverio Danker an APP for swelling

## 2019-03-21 NOTE — Progress Notes (Signed)
Hand MD Dr. Lenon Curt contacted and given an update on overdosage of Heparin and the status of patient & transfer.

## 2019-03-21 NOTE — Telephone Encounter (Signed)
Spoke with Carrie [name].  Mrs. Coral fell and broke her arm and she in back in the hospital.

## 2019-03-21 NOTE — Progress Notes (Signed)
Heparin event noted, pt may eat, no surgery today. Will reevaluate this afternoon for possible OR tomorrow.

## 2019-03-21 NOTE — Progress Notes (Addendum)
ANTICOAGULATION CONSULT NOTE - Follow-Up Consult  Pharmacy Consult for heparin Indication: mechanical mitral valve, atrial fibrillation  Allergies  Allergen Reactions  . Codeine Nausea And Vomiting    Patient Measurements: Height: 5\' 1"  (154.9 cm) Weight: 122 lb (55.3 kg) IBW/kg (Calculated) : 47.8 Heparin Dosing Weight: 54kg  Vital Signs: Temp: 98.4 F (36.9 C) (06/30 0744) Temp Source: Oral (06/30 1242) BP: 118/70 (06/30 1242) Pulse Rate: 85 (06/30 1242)  Labs: Recent Labs    03/20/19 1325 03/20/19 1418 03/21/19 0125 03/21/19 0702  HGB 8.0*  --  8.0*  --   HCT 25.8*  --  24.1*  --   PLT 188  --  174  --   APTT  --   --  >200*  --   LABPROT 20.8*  --  25.1*  --   INR 1.8*  --  2.3*  --   HEPARINUNFRC  --   --  1.07* 0.30  CREATININE 1.46*  --  1.34*  --   TROPONINIHS 39* 40*  --   --     Estimated Creatinine Clearance: 26.5 mL/min (A) (by C-G formula based on SCr of 1.34 mg/dL (H)).   Medical History: Past Medical History:  Diagnosis Date  . Anticoagulated on Coumadin    for mech valve and atrial fib  goal 2.0-2.5  . Anxiety   . Arthritis    "right shoulder" (03/15/2013)  . Atrial fibrillation ([place])   . Atrial flutter ([place])   . CAD (coronary artery disease) 08/11/2016  . Chronic combined systolic and diastolic CHF (congestive heart failure) ([place])    a. 02/2016 Echo: EF 45-50%.  . Diverticula of colon   . GERD (gastroesophageal reflux disease)   . Hemorrhage intraabdominal 03/17/2012  . Hyperlipidemia   . Hypertension   . Liver hemorrhage   . Migraines   . 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 Bi-leaflet St. Jude mechanical prosthesis; b. 02/2016 Echo: EF 45-50%, some degree of MR, sev dil LA/RA, sev TR.  . NSTEMI (non-ST elevated myocardial infarction) ([place])    a. 02/2016 elev trop/Cath: nonobs dzs,   . Pacemaker    medtronic adapta  . Severe tricuspid regurgitation    a. 02/2016 Echo: Ef 45-50%, sev TR, PASP 13mmHg.  . Sick sinus  syndrome ([place])    Dr Cristopher Peru. EP study negative. Pacemaker 12/15/06 Medtronic  . Stroke [facility])    "they say I had a stroke last year" denies residual on 03/15/2013  . Subdural hematoma (H[facility])     Assessment: 56 yoF on warfarin at home for hx of AFib and mechanical MVR. INR goal previously 2-2.5 with hx of SAH, but was adjusted by anticoagulation clinic to 2.5-3 on 03/16/19. Pt admitted s/p fall with INR 1.8 and plans for surgery today. Pt started on heparin drip and was inadvertently given overdose by nursing with pump infusion error. Pt received protamine 50mg  x1 overnight and heparin level has now trended down to 0.30 at 0700. INR was 2.3 this morning, pt now s/p vitamin K IV x1.  Heparin level normalized this am s/p protamine overnight. However, after resuming low-dose heparin infusion, phlebotomy unable to draw heparin levels. Discussed with Dr. Sherral Hammers, will stop heparin infusion and given subcutaneous heparin 5000 units q8h (VTE ppx dosing) since we are unable to monitor heparin infusion with levels which might potentially increase due to rebound effects with protamine (and pt has hx recent head bleed). Heparin SQ will stop prior to midnight in anticipation of surgery  tomorrow morning.   Goal of Therapy:  Heparin level 0.3-0.5 units/ml Monitor platelets by anticoagulation protocol: Yes   Plan:  -Hold IV heparin -Begin heparin 5000 units SQ TID through today -Will need to follow-up longterm anticoagulation plans post-op   Arrie Senate, PharmD, BCPS Clinical Pharmacist [phone] Please check AMION for all [place] numbers 03/21/2019

## 2019-03-21 NOTE — Progress Notes (Signed)
PROGRESS NOTE    Carrie [name]  DVV:[id number] DOB: 08/01/1941 DOA: 03/20/2019 PCP: Lucianne Lei, MD   Brief Narrative:  78 y.o. 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  She denies that she had chest pain, palpitation, dizziness.  She denies that she had loss of consciousness.  She states she "just fell".  She does not think she hit her head.  She did have wrist pain and understands that she needs to have surgery in the morning.  At present she has no acute complaints other than that she is hungry and thirsty.  She feels her pain is well controlled.  She denies any recent illnesses.  Denies any recent fevers or chills.  Denies cough.  Denies nausea vomiting or diarrhea.  Denies abdominal pain.  Denies dysuria.  ED Course:  The patient was noted to have a right wrist fracture.  She was seen by orthopedics who request [facility] admit given multiple medical    Subjective: A/O x4, however still confused (when asked how she fell patient repeats over and over again I fell: Perseveration), negative CP, negative S OB, negative abdominal pain.   Assessment & Plan:   Principal Problem:   Right wrist fracture Active Problems:   Atrial fibrillation (HCC)   S/P mitral valve replacement, [employer]   Chronic anticoagulation, ( INR goal 2.0-2.5 due to history of subdural hematoma and liver hemorrhage)   Essential hypertension   SSS (sick sinus syndrome) (HCC)   Longstanding persistent atrial fibrillation   Pacemaker   NSTEMI (non-ST elevated myocardial infarction) ([place])   PAH (pulmonary artery hypertension) ([place])   Hypertensive heart disease with heart failure (HCC)   Chronic diastolic heart failure (HCC)   CKD (chronic kidney disease), stage  III (HCC)   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 Dayna Barker per his note he states plans on reevaluating patient this afternoon and possibly performing surgery in the A.m.   -N.p.o. after midnight - Hold heparin after midnight - Hold all aspirin, NSAIDs until post surgery.  Mechanical fall/syncope? -Patient very confused concerning what caused/how she fell.   - Per admission note patient stated negative chest pain or palpitations, but given her confusion as to the fall incident not convinced she fully recalls what occurred. -Obtain echocardiogram -Obtain orthostatic vitals -Obtain TSH  Chronic Diastolic CHF/sick sinus syndrome - strict in and out -Daily weights -Lasix 80 mg BID -Metoprolol 25 mg BID -Spironolactone 12.5 mg daily  Atrial fibrillation -Was on warfarin at home discontinued in preparation for surgery -Monitor INR  -Vitamin K, 3 mg -Continue heparin Recent Labs  Lab 03/16/19 03/20/19 1325 03/21/19 0125  INR 1.5* 1.8* 2.3*  -See CHF -Currently rate controlled  Heparin overdose - Learn from pharmacy that overnight patient was overdose on heparin.  Noted in chart that Dr. Dayna Barker was notified of overdose.  After discussing case with pharmacy we have decided that we will only treat patient's A. fib with subcu dose of heparin as heparin was going to be stopped at midnight tonight in preparation for her surgery in the a.m.   CAD - See CHF  CKD stage III baselineCr~1.32) -Monitor closely Recent Labs  Lab 03/20/19 1325 03/21/19 0125  CREATININE 1.46* 1.34*   Dementia -Secondary to multiple strokes and subdural hematoma. - As previously mentioned A/O x4 however patient has perseveration and get some recent fax wrong such as events leading up to her fall. -Baseline?   DVT  prophylaxis: Heparin Code Status: Full Family Communication: None Disposition Plan: TBD   Consultants:  Dr. Dayna Barker Surgery   Procedures/Significant Events:     I have personally reviewed and interpreted all radiology studies and my findings are as above.  VENTILATOR SETTINGS:    Cultures   Antimicrobials: Anti-infectives (From admission, onward)   Start     Stop   03/21/19 0600  ceFAZolin (ANCEF) IVPB 2g/100 mL premix  Status:  Discontinued     03/20/19 2338       Devices    LINES / TUBES:      Continuous Infusions:  heparin     phytonadione (VITAMIN K) IV       Objective: Vitals:   03/21/19 0500 03/21/19 0600 03/21/19 0620 03/21/19 0744  BP: 111/65 (!) 142/82  (!) 120/53  Pulse: 76 91  86  Resp: 17 19  19   Temp:    98.4 F (36.9 C)  TempSrc:    Oral  SpO2: 100% 96%  94%  Weight:   55.3 kg   Height:        Intake/Output Summary (Last 24 hours) at 03/21/2019 0942 Last data filed at 03/21/2019 0747 Gross per 24 hour  Intake --  Output 650 ml  Net -650 ml   Filed Weights   03/20/19 1141 03/21/19 0620  Weight: 54 kg 55.3 kg    Examination:  General: A/O x4, (perseveration), no acute respiratory distress Eyes: negative scleral hemorrhage, negative anisocoria, negative icterus ENT: Negative Runny nose, negative gingival bleeding, Neck:  Negative scars, masses, torticollis, lymphadenopathy, JVD Lungs: Clear to auscultation bilaterally without wheezes or crackles Cardiovascular: Regular irregular rhythm and rate, wthout murmur gallop or rub normal S1 and loud S2 Abdomen: negative abdominal pain, nondistended, positive soft, bowel sounds, no rebound, no ascites, no appreciable mass Extremities: No significant cyanosis, clubbing, or edema bilateral lower extremities Skin: Negative rashes, lesions, ulcers, except for RIGHT upper extremity which is in a splint for immobilization. Psychiatric:  Negative depression, negative anxiety,  negative fatigue, negative mania  Central nervous system:  Cranial nerves II through XII intact, tongue/uvula midline, all extremities muscle strength 5/5, sensation intact throughout,  negative dysarthria, negative expressive aphasia, negative receptive aphasia.  Positive perseveration (baseline?)  .     Data Reviewed: Care during the described time interval was provided by me .  I have reviewed this patient's available data, including medical history, events of note, physical examination, and all test results as part of my evaluation.   CBC: Recent Labs  Lab 03/20/19 1325 03/21/19 0125  WBC 5.6 6.3  NEUTROABS 3.7  --   HGB 8.0* 8.0*  HCT 25.8* 24.1*  MCV 92.8 91.3  PLT 188 678   Basic Metabolic Panel: Recent Labs  Lab 03/20/19 1325 03/21/19 0125  NA 140 137  K 4.0 3.8  CL 105 104  CO2 24 24  GLUCOSE 104* 116*  BUN 38* 35*  CREATININE 1.46* 1.34*  CALCIUM 9.9 9.5   GFR: Estimated Creatinine Clearance: 26.5 mL/min (A) (by C-G formula based on SCr of 1.34 mg/dL (H)). Liver Function Tests: No results for input(s): AST, ALT, ALKPHOS, BILITOT, PROT, ALBUMIN in  the last 168 hours. No results for input(s): LIPASE, AMYLASE in the last 168 hours. No results for input(s): AMMONIA in the last 168 hours. Coagulation Profile: Recent Labs  Lab 03/16/19 03/20/19 1325 03/21/19 0125  INR 1.5* 1.8* 2.3*   Cardiac Enzymes: No results for input(s): CKTOTAL, CKMB, CKMBINDEX, TROPONINI in the last 168 hours. BNP (last 3 results) No results for input(s): PROBNP in the last 8760 hours. HbA1C: No results for input(s): HGBA1C in the last 72 hours. CBG: No results for input(s): GLUCAP in the last 168 hours. Lipid Profile: No results for input(s): CHOL, HDL, LDLCALC, TRIG, CHOLHDL, LDLDIRECT in the last 72 hours. Thyroid Function Tests: No results for input(s): TSH, T4TOTAL, FREET4, T3FREE, THYROIDAB in the last 72 hours. Anemia Panel: No results for input(s): VITAMINB12, FOLATE,  FERRITIN, TIBC, IRON, RETICCTPCT in the last 72 hours. Urine analysis:    Component Value Date/Time   COLORURINE YELLOW 03/20/2019 1404   APPEARANCEUR HAZY (A) 03/20/2019 1404   LABSPEC 1.009 03/20/2019 1404   PHURINE 7.0 03/20/2019 1404   GLUCOSEU NEGATIVE 03/20/2019 1404   HGBUR NEGATIVE 03/20/2019 1404   BILIRUBINUR NEGATIVE 03/20/2019 1404   KETONESUR NEGATIVE 03/20/2019 1404   PROTEINUR NEGATIVE 03/20/2019 1404   UROBILINOGEN 0.2 03/15/2013 1323   NITRITE NEGATIVE 03/20/2019 1404   LEUKOCYTESUR SMALL (A) 03/20/2019 1404   Sepsis Labs: @LABRCNTIP (procalcitonin:4,lacticidven:4)  ) Recent Results (from the past 240 hour(s))  SARS Coronavirus 2 (CEPHEID- Performed in [place] hospital lab), Hosp Order     Status: None   Collection Time: 03/20/19  4:04 PM   Specimen: Nasopharyngeal Swab  Result Value Ref Range Status   SARS Coronavirus 2 NEGATIVE NEGATIVE Final    Comment: (NOTE) If result is NEGATIVE SARS-CoV-2 target nucleic acids are NOT DETECTED. The SARS-CoV-2 RNA is generally detectable in upper and lower  respiratory specimens during the acute phase of infection. The lowest  concentration of SARS-CoV-2 viral copies this assay can detect is 250  copies / mL. A negative result does not preclude SARS-CoV-2 infection  and should not be used as the sole basis for treatment or other  patient management decisions.  A negative result may occur with  improper specimen collection / handling, submission of specimen other  than nasopharyngeal swab, presence of viral mutation(s) within the  areas targeted by this assay, and inadequate number of viral copies  (<250 copies / mL). A negative result must be combined with clinical  observations, patient history, and epidemiological information. If result is POSITIVE SARS-CoV-2 target nucleic acids are DETECTED. The SARS-CoV-2 RNA is generally detectable in upper and lower  respiratory specimens dur ing the acute phase of  infection.  Positive  results are indicative of active infection with SARS-CoV-2.  Clinical  correlation with patient history and other diagnostic information is  necessary to determine patient infection status.  Positive results do  not rule out bacterial infection or co-infection with other viruses. If result is PRESUMPTIVE POSTIVE SARS-CoV-2 nucleic acids MAY BE PRESENT.   A presumptive positive result was obtained on the submitted specimen  and confirmed on repeat testing.  While 2019 novel coronavirus  (SARS-CoV-2) nucleic acids may be present in the submitted sample  additional confirmatory testing may be necessary for epidemiological  and / or clinical management purposes  to differentiate between  SARS-CoV-2 and other Sarbecovirus currently known to infect humans.  If clinically indicated additional testing with an alternate test  methodology [phone]) is advised. The SARS-CoV-2 RNA is generally  detectable in  upper and lower respiratory sp ecimens during the acute  phase of infection. The expected result is Negative. Fact Sheet for Patients:  StrictlyIdeas.no Fact Sheet for Healthcare Providers: BankingDealers.co.za This test is not yet approved or cleared by the [place] FDA and has been authorized for detection and/or diagnosis of SARS-CoV-2 by FDA under an Emergency Use Authorization (EUA).  This EUA will remain in effect (meaning this test can be used) for the duration of the COVID-19 declaration under Section 564(b)(1) of the Act, 21 U.S.C. section 360bbb-3(b)(1), unless the authorization is terminated or revoked sooner. Performed at [facility], [place] [address], [place], [place] [address]   MRSA PCR Screening     Status: None   Collection Time: 03/20/19 11:40 PM   Specimen: Nasal Mucosa; Nasopharyngeal  Result Value Ref Range Status   MRSA by PCR NEGATIVE NEGATIVE Final    Comment:        The GeneXpert MRSA  Assay (FDA approved for NASAL specimens only), is one component of a comprehensive MRSA colonization surveillance program. It is not intended to diagnose MRSA infection nor to guide or monitor treatment for MRSA infections. Performed at [facility], [place] [address], [place], [place] [address]          Radiology Studies: Dg Chest 1 View  Result Date: 03/20/2019 CLINICAL DATA:  Recent fall EXAM: CHEST  1 VIEW COMPARISON:  02/23/2019 FINDINGS: Cardiac shadow remains enlarged. Postsurgical changes as well as a pacing device are again seen. Mild central vascular congestion is noted stable from the prior study. Stable small right pleural effusion is noted. No focal infiltrate is seen. IMPRESSION: Stable vascular congestion and small right pleural effusion. Electronically Signed   By: Inez Catalina M.D.   On: 03/20/2019 13:12   Dg Wrist 2 Views Right  Result Date: 03/20/2019 CLINICAL DATA:  Wrist fracture post casting EXAM: RIGHT WRIST - 2 VIEW COMPARISON:  03/20/2019 FINDINGS: Interval placement of cast. Improved alignment post reduction. Decreased dorsal displacement of comminuted fracture distal radius. Avulsion of the ulnar styloid. IMPRESSION: Significantly improved alignment of dorsally displaced fracture distal radius. Fracture of the ulnar styloid also noted. Electronically Signed   By: Franchot Gallo M.D.   On: 03/20/2019 18:15   Dg Wrist Complete Right  Result Date: 03/20/2019 CLINICAL DATA:  Recent fall with wrist pain, initial encounter EXAM: RIGHT WRIST - COMPLETE 3+ VIEW COMPARISON:  None. FINDINGS: Comminuted distal radial fracture is noted with impaction and posterior angulation at the fracture site. There is approximately 3/4 width bone displacement posteriorly. Avulsion fracture of the ulnar styloid is noted as well. There appears to be intra-articular involvement of the radial fracture. Soft tissue swelling is noted. IMPRESSION: Distal radial and ulnar fractures as  described. Intra-articular involvement of the radial fracture is seen as well as posterior displacement and angulation. Electronically Signed   By: Inez Catalina M.D.   On: 03/20/2019 13:11   Ct Head Wo Contrast  Result Date: 03/20/2019 CLINICAL DATA:  Status post fall, striking her head EXAM: CT HEAD WITHOUT CONTRAST CT CERVICAL SPINE WITHOUT CONTRAST TECHNIQUE: Multidetector CT imaging of the head and cervical spine was performed following the standard protocol without intravenous contrast. Multiplanar CT image reconstructions of the cervical spine were also generated. COMPARISON:  02/24/2019, 02/22/2019 FINDINGS: CT HEAD FINDINGS Brain: No evidence of acute infarction, acute hemorrhage, extra-axial collection, ventriculomegaly, or mass effect. 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 Generalized cerebral atrophy. Periventricular white matter low attenuation  likely secondary to microangiopathy. Vascular: Cerebrovascular atherosclerotic calcifications are noted. Skull: Negative for fracture or focal lesion. Sinuses/Orbits: Visualized portions of the orbits are unremarkable. Mastoid sinuses are clear. Right maxillary sinus mucosal thickening. Mild right ethmoid sinus mucosal thickening. Other: None. CT CERVICAL SPINE FINDINGS Alignment: Normal. Skull base and vertebrae: No acute fracture. No primary bone lesion or focal pathologic process. Soft tissues and spinal canal: No prevertebral fluid or swelling. No visible canal hematoma. Disc levels: Degenerative disc disease with disc height loss C5-6. Bilateral facet arthropathy at C6-7 on the right. No foraminal stenosis. Upper chest: Lung apices are clear. Other: Thoracic aortic atherosclerosis. IMPRESSION: 1. No acute intracranial pathology. Subacute-chronic right posterior fossa subdural hematoma. 2. No acute osseous injury of the cervical spine. 3.  Aortic Atherosclerosis (ICD10-I70.0). Electronically Signed   By: Kathreen Devoid    On: 03/20/2019 13:00   Ct Cervical Spine Wo Contrast  Result Date: 03/20/2019 CLINICAL DATA:  Status post fall, striking her head EXAM: CT HEAD WITHOUT CONTRAST CT CERVICAL SPINE WITHOUT CONTRAST TECHNIQUE: Multidetector CT imaging of the head and cervical spine was performed following the standard protocol without intravenous contrast. Multiplanar CT image reconstructions of the cervical spine were also generated. COMPARISON:  02/24/2019, 02/22/2019 FINDINGS: CT HEAD FINDINGS Brain: No evidence of acute infarction, acute hemorrhage, extra-axial collection, ventriculomegaly, or mass effect. 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 Generalized cerebral atrophy. Periventricular white matter low attenuation likely secondary to microangiopathy. Vascular: Cerebrovascular atherosclerotic calcifications are noted. Skull: Negative for fracture or focal lesion. Sinuses/Orbits: Visualized portions of the orbits are unremarkable. Mastoid sinuses are clear. Right maxillary sinus mucosal thickening. Mild right ethmoid sinus mucosal thickening. Other: None. CT CERVICAL SPINE FINDINGS Alignment: Normal. Skull base and vertebrae: No acute fracture. No primary bone lesion or focal pathologic process. Soft tissues and spinal canal: No prevertebral fluid or swelling. No visible canal hematoma. Disc levels: Degenerative disc disease with disc height loss C5-6. Bilateral facet arthropathy at C6-7 on the right. No foraminal stenosis. Upper chest: Lung apices are clear. Other: Thoracic aortic atherosclerosis. IMPRESSION: 1. No acute intracranial pathology. Subacute-chronic right posterior fossa subdural hematoma. 2. No acute osseous injury of the cervical spine. 3.  Aortic Atherosclerosis (ICD10-I70.0). Electronically Signed   By: Kathreen Devoid   On: 03/20/2019 13:00        Scheduled Meds:  furosemide  80 mg Oral BID   metoprolol tartrate  25 mg Oral BID   oxybutynin  2.5 mg Oral QHS    spironolactone  12.5 mg Oral Daily   topiramate  25 mg Oral BID   Continuous Infusions:  heparin     phytonadione (VITAMIN K) IV       LOS: 1 day   The patient is critically ill with multiple organ systems failure and requires high complexity decision making for assessment and support, frequent evaluation and titration of therapies, application of advanced monitoring technologies and extensive interpretation of multiple databases. Critical Care Time devoted to patient care services described in this note  Time spent: 40 minutes     Yostin Malacara, Geraldo Docker, MD [facility] Pager [phone]  If 7PM-7AM, please contact night-coverage [email or web address] Password [email or web address] 03/21/2019, 9:42 AM

## 2019-03-21 NOTE — Plan of Care (Signed)

## 2019-03-22 DIAGNOSIS — N390 Urinary tract infection, site not specified: Secondary | ICD-10-CM

## 2019-03-22 DIAGNOSIS — I63511 Cerebral infarction due to unspecified occlusion or stenosis of right middle cerebral artery: Secondary | ICD-10-CM

## 2019-03-22 LAB — BASIC METABOLIC PANEL
Anion gap: 11 (ref 5–15)
BUN: 32 mg/dL — ABNORMAL HIGH (ref 8–23)
CO2: 25 mmol/L (ref 22–32)
Calcium: 9.6 mg/dL (ref 8.9–10.3)
Chloride: 99 mmol/L (ref 98–111)
Creatinine, Ser: 1.16 mg/dL — ABNORMAL HIGH (ref 0.44–1.00)
Glucose, Bld: 107 mg/dL — ABNORMAL HIGH (ref 70–99)
Potassium: 3.5 mmol/L (ref 3.5–5.1)
Sodium: 135 mmol/L (ref 135–145)

## 2019-03-22 LAB — URINE CULTURE: Culture: >=100000 — AB

## 2019-03-22 LAB — CBC
HCT: 24.4 % — ABNORMAL LOW (ref 36.0–46.0)
Hemoglobin: 8 g/dL — ABNORMAL LOW (ref 12.0–15.0)
MCH: 29.6 pg (ref 26.0–34.0)
MCHC: 32.8 g/dL (ref 30.0–36.0)
MCV: 90.4 fL (ref 80.0–100.0)
Platelets: 187 10*3/uL (ref 150–400)
RBC: 2.7 MIL/uL — ABNORMAL LOW (ref 3.87–5.11)
RDW: 34.7 % — ABNORMAL HIGH (ref 11.5–15.5)
WBC: 6.9 10*3/uL (ref 4.0–10.5)
nRBC: 0.4 % — ABNORMAL HIGH (ref 0.0–0.2)

## 2019-03-22 LAB — PROTIME-INR
INR: 1.4 — ABNORMAL HIGH (ref 0.8–1.2)
Prothrombin Time: 16.8 seconds — ABNORMAL HIGH (ref 11.4–15.2)

## 2019-03-22 LAB — HEPARIN LEVEL (UNFRACTIONATED)
Heparin Unfractionated: <0.1 IU/mL — ABNORMAL LOW (ref 0.30–0.70)
Heparin Unfractionated: <0.1 IU/mL — ABNORMAL LOW (ref 0.30–0.70)

## 2019-03-22 LAB — TSH: TSH: 2.625 u[IU]/mL (ref 0.350–4.500)

## 2019-03-22 LAB — MAGNESIUM: Magnesium: 2.3 mg/dL (ref 1.7–2.4)

## 2019-03-22 MED ORDER — HEPARIN (PORCINE) 25000 UT/250ML-% IV SOLN
900.0000 [IU]/h | INTRAVENOUS | Status: AC
  Administered 2019-03-22: 750 [IU]/h via INTRAVENOUS

## 2019-03-22 MED ORDER — SODIUM CHLORIDE 0.9 % IV SOLN
1.0000 g | INTRAVENOUS | Status: AC
  Administered 2019-03-22 – 2019-03-24 (×3): 1 g via INTRAVENOUS
  Filled 2019-03-22 (×3): qty 10

## 2019-03-22 NOTE — Progress Notes (Addendum)
PROGRESS NOTE    BRYNA RAZAVI  DVV:[id number] DOB: 11/21/1940 DOA: 03/20/2019 PCP: Lucianne Lei, MD   Brief Narrative:  78 y.o. 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 She denies that she had chest pain, palpitation, dizziness.  She denies that she had loss of consciousness.  She states she "just fell".  She does not think she hit her head.  She did have wrist pain and understands that she needs to have surgery in the morning.  At present she has no acute complaints other than that she is hungry and thirsty.  She feels her pain is well controlled. She denies any recent illnesses.  Denies any recent fevers or chills.  Denies cough.  Denies nausea vomiting or diarrhea.  Denies abdominal pain.  Denies dysuria. The patient was noted to have a right wrist fracture. She was seen by orthopedics who request [facility] hospitalist admit given multiple medical.  Subjective: A/O x4, somewhat confused, unable to explain exactly how she fell.  Husband over the phone at bedside indicates she had what appears to be a mechanical fall after slipping the restroom at home.  Currently complains of some right wrist pain but otherwise declines headache, fever, chills, nausea, vomiting, diarrhea, constipation.  Assessment & Plan:   Principal Problem:   Right wrist fracture Active Problems:   Acute lower UTI   Atrial fibrillation (HCC)   S/P mitral valve replacement, St Jude   Chronic anticoagulation, ( INR goal 2.0-2.5 due to history of subdural hematoma and liver hemorrhage)   Essential hypertension   SSS (sick sinus syndrome) (HCC)   Longstanding persistent atrial fibrillation   Pacemaker   NSTEMI (non-ST elevated myocardial infarction) ([place])   PAH (pulmonary artery  hypertension) ([place])   Hypertensive heart disease with heart failure (HCC)   Chronic diastolic heart failure (HCC)   CKD (chronic kidney disease), stage III (HCC)   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 (HCC)   Acute right MCA stroke (HCC)   Dementia (HCC)   CAD (coronary artery disease)  Right wrist fracture status post mechanical fall POA Patient evaluated by orthopedic surgery Dr. Dayna Barker  Previous surgery held-elevated PTT after heparin over-dosing N.p.o. after midnight Holding heparin as below Hold all aspirin, NSAIDs until post surgery.  Mechanical fall/syncope? Patient very confused concerning what caused/how she fell.   Per admission note patient stated negative chest pain or palpitations, but given her confusion as to the fall incident not convinced she fully recalls what occurred. TSH WNL Orthostatics not performed Echo is grossly unchanged from prior per report - severe TR, EF 55-60%  Acute UTI, POA Is urine culture remarkable for E. coli, susceptibilities to follow GI patient's ambulatory dysfunction and fall as above with resulting fracture  Chronic Diastolic CHF/sick sinus syndrome, not in acute exacerbation Strict in and out Daily weights Lasix 80 mg BID Metoprolol 25 mg BID Spironolactone 12.5 mg daily  Atrial fibrillation, rate controlled Was on warfarin at home discontinued in preparation for surgery Monitor INR - 1.5 > 1.8 > 2.3 > 1.4 Vitamin K - 3 mg previously given Continue heparin See CHF Currently rate controlled  Heparin overdose, resolving Learn from pharmacy that overnight patient was overdose on heparin. Noted in chart that Dr. Dayna Barker was notified of overdose. After discussing case  with pharmacy we have decided that we will only treat patient's A. fib with subcu dose of heparin as heparin was going to be stopped at midnight tonight in preparation for her surgery in the a.m.  CAD Stable, see CHF  CKD stage III  baselineCr~1.32) Monitor closely At baseline  Dementia Secondary to multiple strokes and previous subdural hematoma. Per husband baseline appears to be alert to person and able to answer general simple questions about current situation (pain, hungry, etc).  DVT prophylaxis: Heparin Code Status: Full Family Communication: Husband over the phone Disposition Plan: Patient, continues to require further evaluation for surgical repair of right wrist fracture, the PT OT evaluation for possible need for placement is discharged home.  Husband is concerned that patient may require more assistance than he is capable of given his advanced age and limited strength.  Consultants  Dr. Dayna Barker Surgery, Ortho   Anti-infectives (From admission, onward)   Start     Stop   03/21/19 0600  ceFAZolin (ANCEF) IVPB 2g/100 mL premix  Status:  Discontinued     03/20/19 2338     Objective: Vitals:   03/21/19 2319 03/22/19 0300 03/22/19 0908 03/22/19 1114  BP: 118/65 135/63 121/76 (!) 116/55  Pulse: 89 61 66 (!) 57  Resp: 20 19 (!) 22 (!) 23  Temp: 97.9 F (36.6 C) 97.6 F (36.4 C) (!) 97.4 F (36.3 C) 98 F (36.7 C)  TempSrc: Oral Oral Oral Oral  SpO2: 99% 100% 97% 100%  Weight:      Height:        Intake/Output Summary (Last 24 hours) at 03/22/2019 1326 Last data filed at 03/22/2019 0908 Gross per 24 hour  Intake 205 ml  Output 1300 ml  Net -1095 ml   Filed Weights   03/20/19 1141 03/21/19 0620  Weight: 54 kg 55.3 kg    Examination: General:  Pleasantly resting in bed, No acute distress.  To person and general situation only. HEENT:  Normocephalic atraumatic.  Sclerae nonicteric, noninjected.  Extraocular movements intact bilaterally. Neck:  Without mass or deformity.  Trachea is midline. Lungs:  Clear to auscultate bilaterally without rhonchi, wheeze, or rales. Heart:  Regular rate and rhythm.  Without murmurs, rubs, or gallops. Abdomen:  Soft, nontender, nondistended.  Without  guarding or rebound. Extremities: Without cyanosis, clubbing, edema, right wrist splinted, bandage clean dry intact Vascular:  Dorsalis pedis and posterior tibial pulses palpable bilaterally. Skin:  Warm and dry, no erythema, no ulcerations.  CBC: Recent Labs  Lab 03/20/19 1325 03/21/19 0125 03/22/19 0321  WBC 5.6 6.3 6.9  NEUTROABS 3.7  --   --   HGB 8.0* 8.0* 8.0*  HCT 25.8* 24.1* 24.4*  MCV 92.8 91.3 90.4  PLT 188 174 416   Basic Metabolic Panel: Recent Labs  Lab 03/20/19 1325 03/21/19 0125 03/22/19 0321  NA 140 137 135  K 4.0 3.8 3.5  CL 105 104 99  CO2 24 24 25   GLUCOSE 104* 116* 107*  BUN 38* 35* 32*  CREATININE 1.46* 1.34* 1.16*  CALCIUM 9.9 9.5 9.6  MG  --   --  2.3   GFR: Estimated Creatinine Clearance: 30.6 mL/min (A) (by C-G formula based on SCr of 1.16 mg/dL (H)). Liver Function Tests: No results for input(s): AST, ALT, ALKPHOS, BILITOT, PROT, ALBUMIN in the last 168 hours. No results for input(s): LIPASE, AMYLASE in the last 168 hours. No results for input(s): AMMONIA in the last 168 hours. Coagulation Profile: Recent Labs  Lab  03/16/19 03/20/19 1325 03/21/19 0125 03/22/19 0321  INR 1.5* 1.8* 2.3* 1.4*   Cardiac Enzymes: No results for input(s): CKTOTAL, CKMB, CKMBINDEX, TROPONINI in the last 168 hours. BNP (last 3 results) No results for input(s): PROBNP in the last 8760 hours. HbA1C: No results for input(s): HGBA1C in the last 72 hours. CBG: No results for input(s): GLUCAP in the last 168 hours. Lipid Profile: No results for input(s): CHOL, HDL, LDLCALC, TRIG, CHOLHDL, LDLDIRECT in the last 72 hours. Thyroid Function Tests: Recent Labs    03/22/19 0321  TSH 2.625   Anemia Panel: No results for input(s): VITAMINB12, FOLATE, FERRITIN, TIBC, IRON, RETICCTPCT in the last 72 hours. Urine analysis:    Component Value Date/Time   COLORURINE YELLOW 03/20/2019 1404   APPEARANCEUR HAZY (A) 03/20/2019 1404   LABSPEC 1.009 03/20/2019 1404    PHURINE 7.0 03/20/2019 1404   GLUCOSEU NEGATIVE 03/20/2019 1404   HGBUR NEGATIVE 03/20/2019 1404   BILIRUBINUR NEGATIVE 03/20/2019 1404   KETONESUR NEGATIVE 03/20/2019 1404   PROTEINUR NEGATIVE 03/20/2019 1404   UROBILINOGEN 0.2 03/15/2013 1323   NITRITE NEGATIVE 03/20/2019 1404   LEUKOCYTESUR SMALL (A) 03/20/2019 1404    Recent Results (from the past 240 hour(s))  Urine culture     Status: Abnormal   Collection Time: 03/20/19  3:29 PM   Specimen: Urine, Catheterized  Result Value Ref Range Status   Specimen Description URINE, CATHETERIZED  Final   Special Requests   Final    NONE Performed at [facility], [place] [address] [place], [place] [address]    Culture >=100,000 COLONIES/mL ESCHERICHIA COLI (A)  Final   Report Status 03/22/2019 FINAL  Final   Organism ID, Bacteria ESCHERICHIA COLI (A)  Final      Susceptibility   Escherichia coli - MIC*    AMPICILLIN <=2 SENSITIVE Sensitive     CEFAZOLIN <=4 SENSITIVE Sensitive     CEFTRIAXONE <=1 SENSITIVE Sensitive     CIPROFLOXACIN <=0.25 SENSITIVE Sensitive     GENTAMICIN <=1 SENSITIVE Sensitive     IMIPENEM <=0.25 SENSITIVE Sensitive     NITROFURANTOIN <=16 SENSITIVE Sensitive     TRIMETH/SULFA <=20 SENSITIVE Sensitive     AMPICILLIN/SULBACTAM <=2 SENSITIVE Sensitive     PIP/TAZO <=4 SENSITIVE Sensitive     Extended ESBL NEGATIVE Sensitive     * >=100,000 COLONIES/mL ESCHERICHIA COLI  SARS Coronavirus 2 (CEPHEID- Performed in [place] hospital lab), Hosp Order     Status: None   Collection Time: 03/20/19  4:04 PM   Specimen: Nasopharyngeal Swab  Result Value Ref Range Status   SARS Coronavirus 2 NEGATIVE NEGATIVE Final    Comment: (NOTE) If result is NEGATIVE SARS-CoV-2 target nucleic acids are NOT DETECTED. The SARS-CoV-2 RNA is generally detectable in upper and lower  respiratory specimens during the acute phase of infection. The lowest  concentration of SARS-CoV-2 viral copies this assay can detect is 250   copies / mL. A negative result does not preclude SARS-CoV-2 infection  and should not be used as the sole basis for treatment or other  patient management decisions.  A negative result may occur with  improper specimen collection / handling, submission of specimen other  than nasopharyngeal swab, presence of viral mutation(s) within the  areas targeted by this assay, and inadequate number of viral copies  (<250 copies / mL). A negative result must be combined with clinical  observations, patient history, and epidemiological information. If result is POSITIVE SARS-CoV-2 target nucleic acids are DETECTED.  The SARS-CoV-2 RNA is generally detectable in upper and lower  respiratory specimens dur ing the acute phase of infection.  Positive  results are indicative of active infection with SARS-CoV-2.  Clinical  correlation with patient history and other diagnostic information is  necessary to determine patient infection status.  Positive results do  not rule out bacterial infection or co-infection with other viruses. If result is PRESUMPTIVE POSTIVE SARS-CoV-2 nucleic acids MAY BE PRESENT.   A presumptive positive result was obtained on the submitted specimen  and confirmed on repeat testing.  While 2019 novel coronavirus  (SARS-CoV-2) nucleic acids may be present in the submitted sample  additional confirmatory testing may be necessary for epidemiological  and / or clinical management purposes  to differentiate between  SARS-CoV-2 and other Sarbecovirus currently known to infect humans.  If clinically indicated additional testing with an alternate test  methodology [phone]) is advised. The SARS-CoV-2 RNA is generally  detectable in upper and lower respiratory sp ecimens during the acute  phase of infection. The expected result is Negative. Fact Sheet for Patients:  StrictlyIdeas.no Fact Sheet for Healthcare Providers: BankingDealers.co.za  This test is not yet approved or cleared by the [place] FDA and has been authorized for detection and/or diagnosis of SARS-CoV-2 by FDA under an Emergency Use Authorization (EUA).  This EUA will remain in effect (meaning this test can be used) for the duration of the COVID-19 declaration under Section 564(b)(1) of the Act, 21 U.S.C. section 360bbb-3(b)(1), unless the authorization is terminated or revoked sooner. Performed at [facility], [place] [address], [place], [place] [address]   MRSA PCR Screening     Status: None   Collection Time: 03/20/19 11:40 PM   Specimen: Nasal Mucosa; Nasopharyngeal  Result Value Ref Range Status   MRSA by PCR NEGATIVE NEGATIVE Final    Comment:        The GeneXpert MRSA Assay (FDA approved for NASAL specimens only), is one component of a comprehensive MRSA colonization surveillance program. It is not intended to diagnose MRSA infection nor to guide or monitor treatment for MRSA infections. Performed at [facility], [place] [address], [place],  [address]      Radiology Studies: Dg Wrist 2 Views Right  Result Date: 03/20/2019 CLINICAL DATA:  Wrist fracture post casting EXAM: RIGHT WRIST - 2 VIEW COMPARISON:  03/20/2019 FINDINGS: Interval placement of cast. Improved alignment post reduction. Decreased dorsal displacement of comminuted fracture distal radius. Avulsion of the ulnar styloid. IMPRESSION: Significantly improved alignment of dorsally displaced fracture distal radius. Fracture of the ulnar styloid also noted. Electronically Signed   By: Franchot Gallo M.D.   On: 03/20/2019 18:15   Scheduled Meds: . furosemide  80 mg Oral BID  . metoprolol tartrate  25 mg Oral BID  . oxybutynin  2.5 mg Oral QHS  . spironolactone  12.5 mg Oral Daily  . topiramate  25 mg Oral BID   Continuous Infusions: . cefTRIAXone (ROCEPHIN)  IV 1 g (03/22/19 1121)  . heparin 750 Units/hr (03/22/19 1248)     LOS: 2 days   Little Ishikawa, MD [facility] Pager [phone]  If 7PM-7AM, please contact night-coverage [email or web address] Password [email or web address] 03/22/2019, 1:26 PM

## 2019-03-22 NOTE — TOC Initial Note (Signed)
Transition of Care [facility]) - Initial/Assessment Note    Patient Details  Name: Carrie [name] MRN: [id number] Date of Birth: 13-May-1941  Transition of Care [facility]) CM/SW Contact:    Candie Chroman, LCSW Phone Number: 03/22/2019, 1:41 PM  Clinical Narrative: CSW met with patient, introduced role, and explained that PT recommendations would be discussed. Patient agreeable to SNF placement. First preference is [facility] but they are not currently accepting new patients because someone tested positive for COVID-19. Provided CMS scores for facilities within 25 miles of her zip code. Asked her to find another preference. No further concerns. CSW encouraged patient to contact CSW as needed. CSW will continue to follow patient for support and facilitate discharge to SNF once medically stable.                 Expected Discharge Plan: Skilled Nursing Facility Barriers to Discharge: Insurance Authorization, Continued Medical Work up   Patient Goals and CMS Choice Patient states their goals for this hospitalization and ongoing recovery are:: "To be able to use my right wrist." CMS Medicare.gov Compare Post Acute Care list provided to:: Patient    Expected Discharge Plan and Services Expected Discharge Plan: [place] Choice: [place] arrangements for the past 2 months: Single Family Home                                      Prior Living Arrangements/Services Living arrangements for the past 2 months: Single Family Home Lives with:: Spouse Patient language and need for interpreter reviewed:: Yes(No needs.) Do you feel safe going back to the place where you live?: Yes      Need for Family Participation in Patient Care: Yes (Comment) Care giver support system in place?: Yes (comment) Current home services: Home OT, Home PT, DME, Home RN Criminal Activity/Legal Involvement Pertinent to Current Situation/Hospitalization: No -  Comment as needed  Activities of Daily Living Home Assistive Devices/Equipment: Gilford Rile (specify type) ADL Screening (condition at time of admission) Patient's cognitive ability adequate to safely complete daily activities?: Yes Is the patient deaf or have difficulty hearing?: Yes Does the patient have difficulty seeing, even when wearing glasses/contacts?: No Does the patient have difficulty concentrating, remembering, or making decisions?: Yes Patient able to express need for assistance with ADLs?: Yes Does the patient have difficulty dressing or bathing?: No Independently performs ADLs?: Yes (appropriate for developmental age) Does the patient have difficulty walking or climbing stairs?: Yes Weakness of Legs: Both Weakness of Arms/Hands: Right  Permission Sought/Granted Permission sought to share information with : Facility Art therapist granted to share information with : Yes, Verbal Permission Granted     Permission granted to share info w AGENCY: SNF's        Emotional Assessment Appearance:: Appears stated age Attitude/Demeanor/Rapport: Engaged, Gracious Affect (typically observed): Accepting, Appropriate, Calm, Pleasant Orientation: : Oriented to Self, Oriented to Place, Oriented to  Time, Oriented to Situation Alcohol / Substance Use: Never Used Psych Involvement: No (comment)  Admission diagnosis:  Wrist fracture [S62.109A] Fall [W19.XXXA] Fall, initial encounter [id number]] Wrist pain, acute, right [M25.531] Other closed intra-articular fracture of distal end of right radius, initial encounter [S52.571A] Closed fracture of distal end of right ulna, unspecified fracture morphology, initial encounter [S52.601A] Patient Active Problem List   Diagnosis Date Noted  . Right wrist fracture 03/20/2019  .  Dementia ([place]) 03/20/2019  . CAD (coronary artery disease) 03/20/2019  . Subdural hemorrhage ([place]) 03/08/2019  . New onset of headaches due to bleed  03/08/2019  . 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 Embolic stroke ([place]) 79/89/2119  . Acute right MCA stroke ([place])   . Cerebrovascular accident (CVA) ([place])   . Sleep disturbance   . Acute on chronic anemia   . Subtherapeutic international normalized ratio (INR) 02/22/2019  . Dysphagia following cerebrovascular accident (CVA) 02/22/2019  . Malnutrition ([place]) 02/22/2019  . Encounter for intubation   . Acute respiratory failure ([place])   . 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 CHF exacerbation ([place]) 02/03/2019  . CHF (congestive heart failure) ([place]) 02/02/2019  . SOB (shortness of breath) 02/02/2019  . Diarrhea 03/31/2018  . Abnormal LFTs 03/31/2018  . Hemoptysis 03/30/2018  . Elevated troponin 03/30/2018  . CKD (chronic kidney disease), stage III ([place]) 03/30/2018  . HLD (hyperlipidemia) 03/30/2018  . Blurry vision 03/30/2018  . Status post coronary artery stent placement   . Chronic diastolic heart failure ([place])   . History of mitral valve replacement with mechanical valve   . Hypertensive heart disease with heart failure ([place])   . Pure hypercholesterolemia   . Coronary artery disease involving native coronary artery of native heart without angina pectoris 08/11/2016  . PAH (pulmonary artery hypertension) ([place]) 05/05/2016  . Anemia   . AKI (acute kidney injury) ([place])   . Hyponatremia 03/12/2016  . Chest pain 03/11/2016  . NSTEMI (non-ST elevated myocardial infarction) ([place]) 03/11/2016  . 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 Thrombocytopenia ([place]) 12/03/2014  . SSS (sick sinus syndrome) ([place]) 07/24/2014  . Longstanding persistent atrial fibrillation 07/24/2014  . Pacemaker 07/24/2014  . Tachycardia, a fib with RVR  04/03/2013  . Essential hypertension 04/03/2013  . SAH (subarachnoid hemorrhage), December 2012 03/21/2012  . Hemorrhage, hepatic, Rx'd with Coumadin reversal and  embolisation 03/17/12 03/17/2012  . 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 Chronic anticoagulation, ( INR goal 2.0-2.5 due to history of subdural hematoma and liver hemorrhage) 09/09/2011  . Muscle weakness of lower extremity 09/08/2011  . Pacemaker - single chamber Medtronic Adapta, 2008 09/08/2011  . Acute lower UTI 09/06/2011  . Atrial fibrillation ([place]) 09/06/2011  . S/P mitral valve replacement, St Jude 09/06/2011  . CONSTIPATION 02/12/2009   PCP:  Lucianne Lei, MD Pharmacy:   [facility], [place] [address] Phone: [phone] Fax: [phone]     Social Determinants of Health (SDOH) Interventions    Readmission Risk Interventions No flowsheet data found.

## 2019-03-22 NOTE — Progress Notes (Addendum)
ANTICOAGULATION CONSULT NOTE - Follow-Up Consult  Pharmacy Consult for heparin Indication: mechanical mitral valve, atrial fibrillation  Allergies  Allergen Reactions  . Codeine Nausea And Vomiting    Patient Measurements: Height: 5\' 1"  (154.9 cm) Weight: 122 lb (55.3 kg) IBW/kg (Calculated) : 47.8 Heparin Dosing Weight: 54kg  Vital Signs: Temp: 98 F (36.7 C) (07/01 1114) Temp Source: Oral (07/01 1114) BP: 116/55 (07/01 1114) Pulse Rate: 57 (07/01 1114)  Labs: Recent Labs    03/20/19 1325 03/20/19 1418 03/21/19 0125 03/21/19 0702 03/22/19 0321  HGB 8.0*  --  8.0*  --  8.0*  HCT 25.8*  --  24.1*  --  24.4*  PLT 188  --  174  --  187  APTT  --   --  >200*  --   --   LABPROT 20.8*  --  25.1*  --  16.8*  INR 1.8*  --  2.3*  --  1.4*  HEPARINUNFRC  --   --  1.07* 0.30  --   CREATININE 1.46*  --  1.34*  --  1.16*  TROPONINIHS 39* 40*  --   --   --     Estimated Creatinine Clearance: 30.6 mL/min (A) (by C-G formula based on SCr of 1.16 mg/dL (H)).   Medical History: Past Medical History:  Diagnosis Date  . Anticoagulated on Coumadin    for mech valve and atrial fib  goal 2.0-2.5  . Anxiety   . Arthritis    "right shoulder" (03/15/2013)  . Atrial fibrillation ([place])   . Atrial flutter ([place])   . CAD (coronary artery disease) 08/11/2016  . Chronic combined systolic and diastolic CHF (congestive heart failure) ([place])    a. 02/2016 Echo: EF 45-50%.  . Diverticula of colon   . GERD (gastroesophageal reflux disease)   . Hemorrhage intraabdominal 03/17/2012  . Hyperlipidemia   . Hypertension   . Liver hemorrhage   . Migraines   . 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 Bi-leaflet St. Jude mechanical prosthesis; b. 02/2016 Echo: EF 45-50%, some degree of MR, sev dil LA/RA, sev TR.  . NSTEMI (non-ST elevated myocardial infarction) ([place])    a. 02/2016 elev trop/Cath: nonobs dzs,   . Pacemaker    medtronic adapta  . Severe tricuspid regurgitation    a.  02/2016 Echo: Ef 45-50%, sev TR, PASP 55mmHg.  . Sick sinus syndrome ([place])    Dr Cristopher Peru. EP study negative. Pacemaker 12/15/06 Medtronic  . Stroke [facility])    "they say I had a stroke last year" denies residual on 03/15/2013  . Subdural hematoma (HCC)     Assessment: 49 yoF on warfarin at home for hx of AFib and mechanical MVR. Pt admitted s/p fall with  plans for surgery, however pt received inadvertent heparin overdose on 6/29 pm. Pt received protamine and vitamin K for INR of 2.3, with heparin reversed. Heparin infusion resumed but phlebotomy unable to draw heparin levels for monitoring, so heparin converted to VTE prophylaxis dosing in anticipation of surgery 7/1 given inability to monitor safely after overdose.  INR down to 1.4 this morning, surgery postponed until tomorrow at the earliest. Per MD, ok to resume heparin infusion and stop at midnight. Will check baseline heparin level prior to infusion to ensure heparin has appropriately cleared - repeat is undetectable indicating heparin has cleared appropriately.   Goal of Therapy:  Heparin level 0.3-0.5 units/ml Monitor platelets by anticoagulation protocol: Yes   Plan:  -Heparin 750 units/hr - stop  at midnight -Check 8hr heparin level   Arrie Senate, PharmD, BCPS Clinical Pharmacist [phone] Please check AMION for all [place] numbers 03/22/2019

## 2019-03-22 NOTE — NC FL2 (Signed)
[facility] LEVEL OF CARE SCREENING TOOL     IDENTIFICATION  Patient Name: Carrie [name] Birthdate: 08-17-1941 Sex: female Admission Date (Current Location): 03/20/2019  [facility] and [place] Number:  Herbalist and Address:         Provider Number: [id number]  Attending Physician Name and Address:  Little Ishikawa, MD  Relative Name and Phone Number:       Current Level of Care: Hospital Recommended Level of Care: [place] Prior Approval Number:    Date Approved/Denied:   PASRR Number: [id number]  Discharge Plan: SNF    Current Diagnoses: Patient Active Problem List   Diagnosis Date Noted  . Right wrist fracture 03/20/2019  . Dementia ([place]) 03/20/2019  . CAD (coronary artery disease) 03/20/2019  . Subdural hemorrhage ([place]) 03/08/2019  . New onset of headaches due to bleed 03/08/2019  . 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 Embolic stroke ([place]) 03/50/0938  . Acute right MCA stroke ([place])   . Cerebrovascular accident (CVA) ([place])   . Sleep disturbance   . Acute on chronic anemia   . Subtherapeutic international normalized ratio (INR) 02/22/2019  . Dysphagia following cerebrovascular accident (CVA) 02/22/2019  . Malnutrition ([place]) 02/22/2019  . Encounter for intubation   . Acute respiratory failure ([place])   . 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 CHF exacerbation ([place]) 02/03/2019  . CHF (congestive heart failure) ([place]) 02/02/2019  . SOB (shortness of breath) 02/02/2019  . Diarrhea 03/31/2018  . Abnormal LFTs 03/31/2018  . Hemoptysis 03/30/2018  . Elevated troponin 03/30/2018  . CKD (chronic kidney disease), stage III ([place]) 03/30/2018  . HLD (hyperlipidemia) 03/30/2018  . Blurry vision 03/30/2018  . Status post coronary artery stent placement   . Chronic diastolic heart failure ([place])   . History of mitral valve replacement with mechanical  valve   . Hypertensive heart disease with heart failure ([place])   . Pure hypercholesterolemia   . Coronary artery disease involving native coronary artery of native heart without angina pectoris 08/11/2016  . PAH (pulmonary artery hypertension) ([place]) 05/05/2016  . Anemia   . AKI (acute kidney injury) ([place])   . Hyponatremia 03/12/2016  . Chest pain 03/11/2016  . NSTEMI (non-ST elevated myocardial infarction) ([place]) 03/11/2016  . 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 Thrombocytopenia ([place]) 12/03/2014  . SSS (sick sinus syndrome) ([place]) 07/24/2014  . Longstanding persistent atrial fibrillation 07/24/2014  . Pacemaker 07/24/2014  . Tachycardia, a fib with RVR  04/03/2013  . Essential hypertension 04/03/2013  . SAH (subarachnoid hemorrhage), December 2012 03/21/2012  . Hemorrhage, hepatic, Rx'd with Coumadin reversal and embolisation 03/17/12 03/17/2012  . 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 Chronic anticoagulation, ( INR goal 2.0-2.5 due to history of subdural hematoma and liver hemorrhage) 09/09/2011  . Muscle weakness of lower extremity 09/08/2011  . Pacemaker - single chamber Medtronic Adapta, 2008 09/08/2011  . Acute lower UTI 09/06/2011  . Atrial fibrillation ([place]) 09/06/2011  . S/P mitral valve replacement, St Jude 09/06/2011  . CONSTIPATION 02/12/2009    Orientation RESPIRATION BLADDER Height & Weight     Self, Time, Situation, Place  O2(Nasal Canula 2 L) Incontinent, External catheter Weight: 122 lb (55.3 kg) Height:  5\' 1"  (154.9 cm)  BEHAVIORAL SYMPTOMS/MOOD NEUROLOGICAL BOWEL NUTRITION STATUS  (None) (Dementia) Continent Diet(Currently NPO for procedure. Diet recommendations will be on discharge summary.)  AMBULATORY STATUS COMMUNICATION OF  NEEDS Skin   Extensive Assist Verbally Bruising, Other (Comment)(Deep tissue injury on left heel: Foam prn (Lift to assess every shift).)                       Personal Care Assistance  Level of Assistance  Bathing, Dressing, Feeding Bathing Assistance: Maximum assistance Feeding assistance: Limited assistance Dressing Assistance: Maximum assistance     Functional Limitations Info  Sight, Speech, Hearing Sight Info: Adequate Hearing Info: Adequate Speech Info: Adequate    SPECIAL CARE FACTORS FREQUENCY  PT (By licensed PT), OT (By licensed OT)     PT Frequency: 5 x week OT Frequency: 5 x week            Contractures Contractures Info: Not present    Additional Factors Info  Code Status, Allergies Code Status Info: Full code Allergies Info: Codeine           Current Medications (03/22/2019):  This is the current hospital active medication list Current Facility-Administered Medications  Medication Dose Route Frequency Provider Last Rate Last Dose  . acetaminophen (TYLENOL) tablet 325-650 mg  325-650 mg Oral Q4H PRN Schorr, Rhetta Mura, NP   650 mg at 03/22/19 1312  . albuterol (PROVENTIL) (2.5 MG/3ML) 0.083% nebulizer solution 2.5 mg  2.5 mg Nebulization Q6H PRN Schorr, Rhetta Mura, NP      . cefTRIAXone (ROCEPHIN) 1 g in sodium chloride 0.9 % 100 mL IVPB  1 g Intravenous Q24H Little Ishikawa, MD 200 mL/hr at 03/22/19 1121 1 g at 03/22/19 1121  . furosemide (LASIX) tablet 80 mg  80 mg Oral BID Schorr, Rhetta Mura, NP   80 mg at 03/21/19 1732  . guaiFENesin (ROBITUSSIN) 100 MG/5ML solution 100 mg  5 mL Oral QHS PRN Schorr, Rhetta Mura, NP      . heparin ADULT infusion 100 units/mL (25000 units/247mL sodium chloride 0.45%)  750 Units/hr Intravenous Continuous Einar Grad, RPH 7.5 mL/hr at 03/22/19 1248 750 Units/hr at 03/22/19 1248  . metoprolol tartrate (LOPRESSOR) tablet 25 mg  25 mg Oral BID Schorr, Rhetta Mura, NP   25 mg at 03/22/19 1313  . nitroGLYCERIN (NITROSTAT) SL tablet 0.4 mg  0.4 mg Sublingual Q5 min PRN Schorr, Rhetta Mura, NP      . oxybutynin (DITROPAN) tablet 2.5 mg  2.5 mg Oral QHS Schorr, Rhetta Mura, NP   2.5 mg at 03/21/19 2143   . polyvinyl alcohol (LIQUIFILM TEARS) 1.4 % ophthalmic solution 1 drop  1 drop Left Eye PRN Schorr, Rhetta Mura, NP      . spironolactone (ALDACTONE) tablet 12.5 mg  12.5 mg Oral Daily Schorr, Rhetta Mura, NP   12.5 mg at 03/22/19 1313  . topiramate (TOPAMAX) tablet 25 mg  25 mg Oral BID Schorr, Rhetta Mura, NP   25 mg at 03/22/19 1313     Discharge Medications: Please see discharge summary for a list of discharge medications.  Relevant Imaging Results:  Relevant Lab Results:   Additional Information SS#: [id number]  Candie Chroman, LCSW

## 2019-03-22 NOTE — Progress Notes (Signed)
ANTICOAGULATION CONSULT NOTE - Follow-Up Consult  Pharmacy Consult for heparin Indication: mechanical mitral valve, atrial fibrillation  Allergies  Allergen Reactions  . Codeine Nausea And Vomiting    Patient Measurements: Height: 5\' 1"  (154.9 cm) Weight: 122 lb (55.3 kg) IBW/kg (Calculated) : 47.8 Heparin Dosing Weight: 54kg  Vital Signs: Temp: 98.2 F (36.8 C) (07/01 2010) Temp Source: Oral (07/01 2010) BP: 116/59 (07/01 1543) Pulse Rate: 70 (07/01 2005)  Labs: Recent Labs    03/20/19 1325 03/20/19 1418  03/21/19 0125 03/21/19 0702 03/22/19 0321 03/22/19 1209 03/22/19 2108  HGB 8.0*  --   --  8.0*  --  8.0*  --   --   HCT 25.8*  --   --  24.1*  --  24.4*  --   --   PLT 188  --   --  174  --  187  --   --   APTT  --   --   --  >200*  --   --   --   --   LABPROT 20.8*  --   --  25.1*  --  16.8*  --   --   INR 1.8*  --   --  2.3*  --  1.4*  --   --   HEPARINUNFRC  --   --    < > 1.07* 0.30  --  <0.10* <0.10*  CREATININE 1.46*  --   --  1.34*  --  1.16*  --   --   TROPONINIHS 39* 40*  --   --   --   --   --   --    < > = values in this interval not displayed.    Estimated Creatinine Clearance: 30.6 mL/min (A) (by C-G formula based on SCr of 1.16 mg/dL (H)).  Assessment: 32 yoF on warfarin at home for hx of AFib and mechanical MVR. Pt admitted s/p fall with  plans for surgery, however pt received inadvertent heparin overdose on 6/29 pm. Pt received protamine and vitamin K for INR of 2.3, with heparin reversed. Heparin infusion resumed but phlebotomy unable to draw heparin levels for monitoring, so heparin converted to VTE prophylaxis dosing in anticipation of surgery 7/1 given inability to monitor safely after overdose.  INR down to 1.4 this morning, surgery postponed until tomorrow at the earliest. Per MD, ok to resume heparin infusion and stop at midnight. Will check baseline heparin level prior to infusion to ensure heparin has appropriately cleared - repeat is  undetectable indicating heparin has cleared appropriately.  PM hep lvl still undetecable  Goal of Therapy:  Heparin level 0.3-0.5 units/ml Monitor platelets by anticoagulation protocol: Yes   Plan:  Increase heparin to 900 units/hr  Off at 0000 F/U post op [facility] plans  Levester Fresh, PharmD, BCPS, BCCCP Clinical Pharmacist [phone]  Please check AMION for all [place] numbers  03/22/2019 10:14 PM

## 2019-03-22 NOTE — Evaluation (Signed)
Occupational Therapy Evaluation Patient Details Name: Carrie [name] MRN: [id number] DOB: 08-25-1941 Today's Date: 03/22/2019    History of Present Illness 78 y.o. 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  Admitted on 03/20/19 following a fall and was found to have R wrist fx. Scheduled for surgery 6/30 currently postponed.    Clinical Impression   PTA patient reports requiring some assist with ADLs, independent with mobility using RW. She was admitted for above, limited by problem list below including pain, decreased functional use of dominant R UE, NWB R UE, impaired balance, generalized weakness and impaired cognition.  She requires: UB ADLs with modA, LB ADLs with max-total assist, mod- max assist for bed mobility.  Declined further mobility today, and requested to return back to supine.  Noted plan for wrist surgery today.  Cognitively, she requires increased time to follow 1 step commands, presents with slow processing and flat affect.  She will benefit from continued OT services while admitted and after dc at SNF level in order to maximize return to PLOF prior to dc home with spouse.    Follow Up Recommendations  SNF;Supervision/Assistance - 24 hour    Equipment Recommendations  None recommended by OT    Recommendations for Other Services       Precautions / Restrictions Precautions Precautions: Fall Restrictions Weight Bearing Restrictions: Yes RUE Weight Bearing: Non weight bearing      Mobility Bed Mobility Overal bed mobility: Needs Assistance Bed Mobility: Supine to Sit;Sit to Supine     Supine to sit: Mod assist Sit to supine: Max assist   General bed mobility comments: mod assist to transition to EOB using pad to scoot hips,  max assist to return to supine for LB and trunk support   Transfers                 General transfer comment: pt declined    Balance Overall balance assessment: Needs assistance Sitting-balance support: No upper extremity supported;Feet supported Sitting balance-Leahy Scale: Fair                                     ADL either performed or assessed with clinical judgement   ADL Overall ADL's : Needs assistance/impaired     Grooming: Minimal assistance;Sitting   Upper Body Bathing: Moderate assistance;Sitting   Lower Body Bathing: Maximal assistance;Sitting/lateral leans;Bed level   Upper Body Dressing : Moderate assistance;Sitting   Lower Body Dressing: Total assistance;Sitting/lateral leans;Bed level Lower Body Dressing Details (indicate cue type and reason): total assist to adjust socks, pt declined standing     Toilet Transfer Details (indicate cue type and reason): pt declined         Functional mobility during ADLs: Moderate assistance(limited to bed mobility only) General ADL Comments: pt limited by impaired functional use of dominant R UE and pain, imapired balance, and generalized weakness      Vision   Vision Assessment?: No apparent visual deficits     Perception     Praxis      Pertinent Vitals/Pain Pain Assessment: Faces Faces Pain Scale: Hurts little more Pain Location: R wrist Pain Descriptors / Indicators: Guarding;Grimacing Pain Intervention(s): Limited activity within patient's tolerance;Monitored during session;Repositioned     Hand Dominance Right  Extremity/Trunk Assessment Upper Extremity Assessment Upper Extremity Assessment: RUE deficits/detail;Generalized weakness RUE Deficits / Details: R wrist fx, proximally able to raise UE from shoulder with assist due to weight of UE RUE: Unable to fully assess due to pain;Unable to fully assess due to immobilization RUE Sensation: WNL RUE Coordination: decreased fine  motor;decreased gross motor   Lower Extremity Assessment Lower Extremity Assessment: Defer to PT evaluation   Cervical / Trunk Assessment Cervical / Trunk Assessment: Kyphotic   Communication Communication Communication: HOH   Cognition Arousal/Alertness: Awake/alert Behavior During Therapy: Flat affect Overall Cognitive Status: Impaired/Different from baseline Area of Impairment: Following commands;Safety/judgement;Awareness;Problem solving                       Following Commands: Follows one step commands with increased time Safety/Judgement: Decreased awareness of safety Awareness: Emergent Problem Solving: Slow processing;Decreased initiation;Difficulty sequencing;Requires verbal cues;Requires tactile cues General Comments: pt with flat affect and 1 word responses, very slow processing noted   General Comments  VSS no dizziness and BP WFL with sitting/supine today, initated ROM to R UE (proximally) AAROM provided and limited due to pain     Exercises     Shoulder Instructions      Home Living Family/patient expects to be discharged to:: Private residence Living Arrangements: Spouse/significant other Available Help at Discharge: Family;Available 24 hours/day Type of Home: House Home Access: Stairs to enter [employer] of Steps: 2 Entrance Stairs-Rails: Can reach both Home Layout: Multi-level;1/2 bath on main level Alternate Level Stairs-Number of Steps: 14(stair lift to 2nd floor )   Bathroom Shower/Tub: Teacher, early years/pre: Handicapped height         Additional Comments: pt reports residing on 2nd floor mostly, she sponge bathes at the bathroom sink       Prior Functioning/Environment Level of Independence: Needs assistance  Gait / Transfers Assistance Needed: Utilizes RW, but was not using when she fell ADL's / Homemaking Assistance Needed: husband assist as needed with bathing, but she reports dressing without assist;  spouse completes IADLs             OT Problem List: Decreased strength;Decreased activity tolerance;Impaired balance (sitting and/or standing);Decreased cognition;Decreased safety awareness;Decreased knowledge of use of DME or AE;Decreased knowledge of precautions;Impaired UE functional use;Pain      OT Treatment/Interventions: Self-care/ADL training;DME and/or AE instruction;Therapeutic exercise;Therapeutic activities;Patient/family education;Balance training;Cognitive remediation/compensation    OT Goals(Current goals can be found in the care plan section) Acute Rehab OT Goals Patient Stated Goal: to lay back down OT Goal Formulation: With patient Time For Goal Achievement: 04/05/19 Potential to Achieve Goals: Good  OT Frequency: Min 3X/week   Barriers to D/C:            Co-evaluation              AM-PAC OT "6 Clicks" Daily Activity     Outcome Measure Help from another person eating meals?: A Little Help from another person taking care of personal grooming?: A Little Help from another person toileting, which includes using toliet, bedpan, or urinal?: Total Help from another person bathing (including washing, rinsing, drying)?: A Lot Help from another person to put on and taking off regular upper body clothing?: A Lot Help from another person to put on and taking off regular lower body clothing?: Total 6 Click Score: 12   End of Session Equipment Utilized During Treatment: Oxygen Nurse Communication: Mobility status  Activity Tolerance: Patient tolerated treatment well Patient left:  in bed;with call bell/phone within reach;with bed alarm set  OT Visit Diagnosis: Other abnormalities of gait and mobility (R26.89);Pain;History of falling (Z91.81) Pain - Right/Left: Right Pain - part of body: Arm                Time: 1638-4665 OT Time Calculation (min): 20 min Charges:  OT General Charges $OT Visit: 1 Visit OT Evaluation $OT Eval Moderate Complexity: [place], OT Acute Rehabilitation Services Pager [phone] Office [phone]   Delight Stare 03/22/2019, 12:47 PM

## 2019-03-23 ENCOUNTER — Encounter (HOSPITAL_COMMUNITY): Payer: Self-pay

## 2019-03-23 ENCOUNTER — Inpatient Hospital Stay (HOSPITAL_COMMUNITY): Payer: Medicare Other | Admitting: Anesthesiology

## 2019-03-23 ENCOUNTER — Encounter (HOSPITAL_COMMUNITY)
Admission: EM | Disposition: A | Discharge status: Skilled Nursing Facility | Payer: Self-pay | Source: Home / Self Care | Attending: Internal Medicine

## 2019-03-23 DIAGNOSIS — S52531A Colles' fracture of right radius, initial encounter for closed fracture: Secondary | ICD-10-CM | POA: Diagnosis not present

## 2019-03-23 HISTORY — PX: ORIF WRIST FRACTURE: SHX2133

## 2019-03-23 LAB — BASIC METABOLIC PANEL
Anion gap: 11 (ref 5–15)
BUN: 27 mg/dL — ABNORMAL HIGH (ref 8–23)
CO2: 23 mmol/L (ref 22–32)
Calcium: 9.4 mg/dL (ref 8.9–10.3)
Chloride: 102 mmol/L (ref 98–111)
Creatinine, Ser: 1 mg/dL (ref 0.44–1.00)
GFR calc Af Amer: >60 mL/min (ref >60)
GFR calc non Af Amer: 54 mL/min — ABNORMAL LOW (ref >60)
Glucose, Bld: 110 mg/dL — ABNORMAL HIGH (ref 70–99)
Potassium: 3.8 mmol/L (ref 3.5–5.1)
Sodium: 136 mmol/L (ref 135–145)

## 2019-03-23 LAB — PROTIME-INR
INR: 1.3 — ABNORMAL HIGH (ref 0.8–1.2)
Prothrombin Time: 15.9 seconds — ABNORMAL HIGH (ref 11.4–15.2)

## 2019-03-23 LAB — CBC
HCT: 26.5 % — ABNORMAL LOW (ref 36.0–46.0)
Hemoglobin: 8.4 g/dL — ABNORMAL LOW (ref 12.0–15.0)
MCH: 29.4 pg (ref 26.0–34.0)
MCHC: 31.7 g/dL (ref 30.0–36.0)
MCV: 92.7 fL (ref 80.0–100.0)
Platelets: 173 10*3/uL (ref 150–400)
RBC: 2.86 MIL/uL — ABNORMAL LOW (ref 3.87–5.11)
RDW: 34.6 % — ABNORMAL HIGH (ref 11.5–15.5)
WBC: 7.5 10*3/uL (ref 4.0–10.5)
nRBC: 0.4 % — ABNORMAL HIGH (ref 0.0–0.2)

## 2019-03-23 LAB — MAGNESIUM: Magnesium: 2.3 mg/dL (ref 1.7–2.4)

## 2019-03-23 SURGERY — OPEN REDUCTION INTERNAL FIXATION (ORIF) WRIST FRACTURE
Anesthesia: Monitor Anesthesia Care | Site: Wrist | Laterality: Right

## 2019-03-23 MED ORDER — MIDAZOLAM HCL 2 MG/2ML IJ SOLN
INTRAMUSCULAR | Status: AC
  Administered 2019-03-23: 1 mg via INTRAVENOUS
  Filled 2019-03-23: qty 2

## 2019-03-23 MED ORDER — PROMETHAZINE HCL 25 MG/ML IJ SOLN: 6.2500 mg | INTRAMUSCULAR | Status: DC | PRN

## 2019-03-23 MED ORDER — MIDAZOLAM HCL 2 MG/2ML IJ SOLN
INTRAMUSCULAR | Status: DC | PRN
  Administered 2019-03-23 (×2): .5 mg via INTRAVENOUS

## 2019-03-23 MED ORDER — MIDAZOLAM HCL 2 MG/2ML IJ SOLN
1.0000 mg | Freq: Once | INTRAMUSCULAR | Status: AC
  Administered 2019-03-23: 1 mg via INTRAVENOUS

## 2019-03-23 MED ORDER — 0.9 % SODIUM CHLORIDE (POUR BTL) OPTIME
TOPICAL | Status: DC | PRN
  Administered 2019-03-23: 1000 mL

## 2019-03-23 MED ORDER — OXYCODONE HCL 5 MG/5ML PO SOLN: 5.0000 mg | Freq: Once | ORAL | Status: DC | PRN

## 2019-03-23 MED ORDER — OXYCODONE HCL 5 MG PO TABS: 5.0000 mg | ORAL_TABLET | Freq: Once | ORAL | Status: DC | PRN

## 2019-03-23 MED ORDER — ESMOLOL HCL 100 MG/10ML IV SOLN
INTRAVENOUS | Status: DC | PRN
  Administered 2019-03-23: 30 mg via INTRAVENOUS
  Administered 2019-03-23: 70 mg via INTRAVENOUS

## 2019-03-23 MED ORDER — CEFAZOLIN SODIUM-DEXTROSE 2-3 GM-%(50ML) IV SOLR
INTRAVENOUS | Status: DC | PRN
  Administered 2019-03-23: 2 g via INTRAVENOUS

## 2019-03-23 MED ORDER — LACTATED RINGERS IV SOLN
INTRAVENOUS | Status: DC
  Administered 2019-03-23: 15:00:00 via INTRAVENOUS

## 2019-03-23 MED ORDER — METOPROLOL TARTRATE 5 MG/5ML IV SOLN
INTRAVENOUS | Status: DC | PRN
  Administered 2019-03-23 (×5): 1 mg via INTRAVENOUS

## 2019-03-23 MED ORDER — ROPIVACAINE HCL 5 MG/ML IJ SOLN
INTRAMUSCULAR | Status: DC | PRN
  Administered 2019-03-23: 30 mL via PERINEURAL

## 2019-03-23 MED ORDER — IPRATROPIUM BROMIDE 0.02 % IN SOLN
0.5000 mg | Freq: Once | RESPIRATORY_TRACT | Status: AC
  Administered 2019-03-23: 0.5 mg via RESPIRATORY_TRACT
  Filled 2019-03-23: qty 2.5

## 2019-03-23 MED ORDER — FENTANYL CITRATE (PF) 100 MCG/2ML IJ SOLN
50.0000 ug | Freq: Once | INTRAMUSCULAR | Status: AC
  Administered 2019-03-23: 50 ug via INTRAVENOUS

## 2019-03-23 MED ORDER — MIDAZOLAM HCL 2 MG/2ML IJ SOLN
INTRAMUSCULAR | Status: AC
  Filled 2019-03-23: qty 2

## 2019-03-23 MED ORDER — PROPOFOL 500 MG/50ML IV EMUL
INTRAVENOUS | Status: DC | PRN
  Administered 2019-03-23: 20 ug/kg/min via INTRAVENOUS

## 2019-03-23 MED ORDER — FENTANYL CITRATE (PF) 100 MCG/2ML IJ SOLN
INTRAMUSCULAR | Status: AC
  Administered 2019-03-23: 50 ug via INTRAVENOUS
  Filled 2019-03-23: qty 2

## 2019-03-23 MED ORDER — HYDROMORPHONE HCL 1 MG/ML IJ SOLN: 0.2500 mg | INTRAMUSCULAR | Status: DC | PRN

## 2019-03-23 SURGICAL SUPPLY — 62 items
APL PRP STRL LF DISP 70% ISPRP (MISCELLANEOUS) ×1
BANDAGE ACE 3X5.8 VEL STRL LF (GAUZE/BANDAGES/DRESSINGS) ×2 IMPLANT
BANDAGE ELASTIC 3 VELCRO ST LF (GAUZE/BANDAGES/DRESSINGS) ×2 IMPLANT
BANDAGE ELASTIC 4 VELCRO ST LF (GAUZE/BANDAGES/DRESSINGS) ×2 IMPLANT
BIT DRILL 2.0 LNG QUCK RELEASE (BIT) IMPLANT
BIT DRILL 2.8 QUICK RELEASE (BIT) IMPLANT
BNDG CMPR 9X4 STRL LF SNTH (GAUZE/BANDAGES/DRESSINGS)
BNDG ESMARK 4X9 LF (GAUZE/BANDAGES/DRESSINGS) IMPLANT
BNDG GAUZE ELAST 4 BULKY (GAUZE/BANDAGES/DRESSINGS) ×2 IMPLANT
CANISTER SUCTION 1500CC (MISCELLANEOUS) ×3 IMPLANT
CHLORAPREP W/TINT 26 (MISCELLANEOUS) ×3 IMPLANT
CORD BIPOLAR FORCEPS 12FT (ELECTRODE) ×3 IMPLANT
COVER SURGICAL LIGHT HANDLE (MISCELLANEOUS) ×3 IMPLANT
COVER WAND RF STERILE (DRAPES) ×3 IMPLANT
CUFF TOURN SGL QUICK 18X4 (TOURNIQUET CUFF) ×3 IMPLANT
CUFF TOURN SGL QUICK 24 (TOURNIQUET CUFF)
CUFF TRNQT CYL 24X4X16.5-23 (TOURNIQUET CUFF) IMPLANT
DRAIN TLS ROUND 10FR (DRAIN) IMPLANT
DRAPE OEC MINIVIEW 54X84 (DRAPES) ×3 IMPLANT
DRAPE SURG 17X23 STRL (DRAPES) ×3 IMPLANT
DRILL 2.0 LNG QUICK RELEASE (BIT) ×3
DRILL 2.8 QUICK RELEASE (BIT) ×3
DRSG XEROFORM 1X8 (GAUZE/BANDAGES/DRESSINGS) ×2 IMPLANT
GAUZE SPONGE 4X4 12PLY STRL (GAUZE/BANDAGES/DRESSINGS) ×2 IMPLANT
GAUZE XEROFORM 1X8 LF (GAUZE/BANDAGES/DRESSINGS) ×3 IMPLANT
GLOVE BIOGEL M 8.0 STRL (GLOVE) ×3 IMPLANT
GOWN STRL REUS W/ TWL XL LVL3 (GOWN DISPOSABLE) ×2 IMPLANT
GOWN STRL REUS W/TWL XL LVL3 (GOWN DISPOSABLE) ×6
GUIDEWIRE ORTHO 0.054X6 (WIRE) ×2 IMPLANT
KIT BASIN OR (CUSTOM PROCEDURE TRAY) ×3 IMPLANT
NEEDLE HYPO 22GX1.5 SAFETY (NEEDLE) IMPLANT
NS IRRIG 1000ML POUR BTL (IV SOLUTION) ×3 IMPLANT
PACK ORTHO EXTREMITY (CUSTOM PROCEDURE TRAY) ×3 IMPLANT
PAD CAST 3X4 CTTN HI CHSV (CAST SUPPLIES) IMPLANT
PAD CAST 4YDX4 CTTN HI CHSV (CAST SUPPLIES) IMPLANT
PADDING CAST ABS 4INX4YD NS (CAST SUPPLIES) ×2
PADDING CAST ABS COTTON 4X4 ST (CAST SUPPLIES) ×1 IMPLANT
PADDING CAST COTTON 3X4 STRL (CAST SUPPLIES) ×3
PADDING CAST COTTON 4X4 STRL (CAST SUPPLIES) ×3
PADDING CAST SYNTHETIC 3 NS LF (CAST SUPPLIES) ×2
PADDING CAST SYNTHETIC 3X4 NS (CAST SUPPLIES) IMPLANT
PLATE R NARROW PROC VDR (Plate) ×2 IMPLANT
SCREW BN FT 16X2.3XLCK HEX CRT (Screw) IMPLANT
SCREW CORT FT 18X2.3XLCK HEX (Screw) IMPLANT
SCREW CORT FT 20X2.3XLCK HEX (Screw) IMPLANT
SCREW CORTICAL LOCKING 2.3X16M (Screw) ×9 IMPLANT
SCREW CORTICAL LOCKING 2.3X18M (Screw) ×3 IMPLANT
SCREW CORTICAL LOCKING 2.3X20M (Screw) ×6 IMPLANT
SCREW NON LOCK 3.5X10MM (Screw) ×2 IMPLANT
SCREW NONLOCK HEX 3.5X12 (Screw) ×4 IMPLANT
SPLINT FIBERGLASS 3X35 (CAST SUPPLIES) ×2 IMPLANT
SUT ETHILON 3 0 PS 1 (SUTURE) ×3 IMPLANT
SUT ETHILON 4 0 PS 2 18 (SUTURE) ×3 IMPLANT
SUT VIC AB 3-0 PS1 18 (SUTURE) ×3
SUT VIC AB 3-0 PS1 18XBRD (SUTURE) ×1 IMPLANT
SUT VICRYL 4-0 PS2 18IN ABS (SUTURE) ×3 IMPLANT
SYR CONTROL 10ML LL (SYRINGE) IMPLANT
TOWEL GREEN STERILE FF (TOWEL DISPOSABLE) ×3 IMPLANT
TUBE CONNECTING 20'X1/4 (TUBING) ×1
TUBE CONNECTING 20X1/4 (TUBING) ×2 IMPLANT
UNDERPAD 30X30 (UNDERPADS AND DIAPERS) ×3 IMPLANT
WATER STERILE IRR 1000ML POUR (IV SOLUTION) ×3 IMPLANT

## 2019-03-23 NOTE — Progress Notes (Signed)
PROGRESS NOTE    Carrie [name]  YCX:[id number] DOB: 31-Oct-1940 DOA: 03/20/2019 PCP: Lucianne Lei, MD   Brief Narrative:  78 y.o. 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 She denies that she had chest pain, palpitation, dizziness.  She denies that she had loss of consciousness.  She states she "just fell".  She does not think she hit her head.  She did have wrist pain and understands that she needs to have surgery in the morning.  At present she has no acute complaints other than that she is hungry and thirsty.  She feels her pain is well controlled. She denies any recent illnesses.  Denies any recent fevers or chills.  Denies cough.  Denies nausea vomiting or diarrhea.  Denies abdominal pain.  Denies dysuria. The patient was noted to have a right wrist fracture. She was seen by orthopedics who request [facility] hospitalist admit given multiple medical.  Subjective: Admits to right wrist pain but otherwise declines headache, fever, chills, nausea, vomiting, diarrhea, constipation.  Assessment & Plan:   Principal Problem:   Right wrist fracture Active Problems:   Acute lower UTI   Atrial fibrillation (HCC)   S/P mitral valve replacement, [employer]   Chronic anticoagulation, ( INR goal 2.0-2.5 due to history of subdural hematoma and liver hemorrhage)   Essential hypertension   SSS (sick sinus syndrome) (HCC)   Longstanding persistent atrial fibrillation   Pacemaker   NSTEMI (non-ST elevated myocardial infarction) ([place])   PAH (pulmonary artery hypertension) ([place])   Hypertensive heart disease with heart failure (HCC)   Chronic diastolic heart failure (HCC)   CKD (chronic kidney disease), stage III (HCC)   Stroke due to embolism of right middle  cerebral artery (HCC)   Acute right MCA stroke (HCC)   Dementia (HCC)   CAD (coronary artery disease)  Right wrist fracture status post mechanical fall POA Patient evaluated by orthopedic surgery Dr. Dayna Barker  Previous surgery held- after heparin over-dosing -pending further evaluation and likely ORIF with surgery per their schedule Resume heparin given resolution of overdose; perioperative management per Ortho Hold all aspirin, NSAIDs until post surgery.  Mechanical fall Syncope ruled out per discussion with husband Patient very confused concerning what caused/how she fell.   Husband over the phone indicates patient had a mechanical fall while in the bathroom, she had not taken her walker with her which is her usual baseline ambulatory status TSH WNL Orthostatics not performed Echo is grossly unchanged from prior per report - severe TR, EF 55-60%  Acute UTI, POA Urine culture remarkable for E. coli, pan sensitive Ceftriaxone x3 doses - last dose 03/24/19 Likely the cause of patient's ambulatory dysfunction and fall as above with resulting fracture  Chronic Diastolic CHF/sick sinus syndrome, not in acute exacerbation Strict in and out Daily weights Lasix 80 mg BID Metoprolol 25 mg BID Spironolactone 12.5 mg daily  Atrial fibrillation, rate controlled Was on warfarin at home discontinued in preparation for surgery Monitor INR - 1.5 > 1.8 > 2.3 > 1.4 Vitamin K - 3 mg previously given Continue heparin See CHF Currently rate controlled  Heparin overdose, resolved Heparin gtt was bolused previously with elevated APTT to >200; PTT 25 on 6/30 Resume heparin now and follow closely - PT 15.9; INR 1.3 today   CAD Stable,  see CHF  CKD stage III baselineCr~1.32) Monitor closely Creatinine currently WNL  Dementia Secondary to multiple strokes and previous subdural hematoma. Per husband baseline appears to be alert to person and able to answer general simple questions about  current situation (pain, hungry, etc).  DVT prophylaxis: Heparin Code Status: Full Family Communication: Husband over the phone Disposition Plan: Patient, continues to require further evaluation for surgical repair of right wrist fracture, the PT OT evaluation for possible need for placement is discharged home.  Husband is concerned that patient may require more assistance than he is capable of providing given his advanced age and limited strength.  Consultants  Dr. Dayna Barker Surgery, Ortho   Anti-infectives (From admission, onward)   Start     Stop   03/21/19 0600  ceFAZolin (ANCEF) IVPB 2g/100 mL premix  Status:  Discontinued     03/20/19 2338     Objective: Vitals:   03/23/19 0300 03/23/19 0700 03/23/19 0857 03/23/19 1055  BP: 112/78 114/71 122/62 (!) 113/52  Pulse: 80  62 82  Resp: 20   (!) 25  Temp: 98.7 F (37.1 C) 98.1 F (36.7 C)  98.1 F (36.7 C)  TempSrc: Oral Axillary  Oral  SpO2: 98%   99%  Weight:      Height:        Intake/Output Summary (Last 24 hours) at 03/23/2019 1131 Last data filed at 03/23/2019 0302 Gross per 24 hour  Intake 517.15 ml  Output 650 ml  Net -132.85 ml   Filed Weights   03/20/19 1141 03/21/19 0620 03/23/19 0147  Weight: 54 kg 55.3 kg 55.3 kg    Examination: General:  Pleasantly resting in bed, No acute distress. Alert to person and general situation only. HEENT:  Normocephalic atraumatic.  Sclerae nonicteric, noninjected.  Extraocular movements intact bilaterally. Neck:  Without mass or deformity.  Trachea is midline. Lungs:  Clear to auscultate bilaterally without rhonchi, wheeze, or rales. Heart:  Regular rate and rhythm.  Without murmurs, rubs, or gallops. Abdomen:  Soft, nontender, nondistended.  Without guarding or rebound. Extremities: Without cyanosis, clubbing, edema, right wrist splinted, bandage clean dry intact Vascular:  Dorsalis pedis and posterior tibial pulses palpable bilaterally. Skin:  Warm and dry, no erythema,  no ulcerations.  CBC: Recent Labs  Lab 03/20/19 1325 03/21/19 0125 03/22/19 0321 03/23/19 0245  WBC 5.6 6.3 6.9 7.5  NEUTROABS 3.7  --   --   --   HGB 8.0* 8.0* 8.0* 8.4*  HCT 25.8* 24.1* 24.4* 26.5*  MCV 92.8 91.3 90.4 92.7  PLT 188 174 187 876   Basic Metabolic Panel: Recent Labs  Lab 03/20/19 1325 03/21/19 0125 03/22/19 0321 03/23/19 0245  NA 140 137 135 136  K 4.0 3.8 3.5 3.8  CL 105 104 99 102  CO2 24 24 25 23   GLUCOSE 104* 116* 107* 110*  BUN 38* 35* 32* 27*  CREATININE 1.46* 1.34* 1.16* 1.00  CALCIUM 9.9 9.5 9.6 9.4  MG  --   --  2.3 2.3   GFR: Estimated Creatinine Clearance: 35.6 mL/min (by C-G formula based on SCr of 1 mg/dL). Liver Function Tests: No results for input(s): AST, ALT, ALKPHOS, BILITOT, PROT, ALBUMIN in the last 168 hours. No results for input(s): LIPASE, AMYLASE in the last 168 hours. No results for input(s): AMMONIA in the last 168 hours. Coagulation Profile: Recent Labs  Lab 03/20/19 1325 03/21/19 0125 03/22/19 0321 03/23/19 0245  INR 1.8* 2.3* 1.4* 1.3*   Cardiac Enzymes: No results  for input(s): CKTOTAL, CKMB, CKMBINDEX, TROPONINI in the last 168 hours. BNP (last 3 results) No results for input(s): PROBNP in the last 8760 hours. HbA1C: No results for input(s): HGBA1C in the last 72 hours. CBG: No results for input(s): GLUCAP in the last 168 hours. Lipid Profile: No results for input(s): CHOL, HDL, LDLCALC, TRIG, CHOLHDL, LDLDIRECT in the last 72 hours. Thyroid Function Tests: Recent Labs    03/22/19 0321  TSH 2.625   Anemia Panel: No results for input(s): VITAMINB12, FOLATE, FERRITIN, TIBC, IRON, RETICCTPCT in the last 72 hours. Urine analysis:    Component Value Date/Time   COLORURINE YELLOW 03/20/2019 1404   APPEARANCEUR HAZY (A) 03/20/2019 1404   LABSPEC 1.009 03/20/2019 1404   PHURINE 7.0 03/20/2019 1404   GLUCOSEU NEGATIVE 03/20/2019 1404   HGBUR NEGATIVE 03/20/2019 1404   BILIRUBINUR NEGATIVE 03/20/2019 1404    KETONESUR NEGATIVE 03/20/2019 1404   PROTEINUR NEGATIVE 03/20/2019 1404   UROBILINOGEN 0.2 03/15/2013 1323   NITRITE NEGATIVE 03/20/2019 1404   LEUKOCYTESUR SMALL (A) 03/20/2019 1404    Recent Results (from the past 240 hour(s))  Urine culture     Status: Abnormal   Collection Time: 03/20/19  3:29 PM   Specimen: Urine, Catheterized  Result Value Ref Range Status   Specimen Description URINE, CATHETERIZED  Final   Special Requests   Final    NONE Performed at [facility], [place] [address], [place], [place] [address]    Culture >=100,000 COLONIES/mL ESCHERICHIA COLI (A)  Final   Report Status 03/22/2019 FINAL  Final   Organism ID, Bacteria ESCHERICHIA COLI (A)  Final      Susceptibility   Escherichia coli - MIC*    AMPICILLIN <=2 SENSITIVE Sensitive     CEFAZOLIN <=4 SENSITIVE Sensitive     CEFTRIAXONE <=1 SENSITIVE Sensitive     CIPROFLOXACIN <=0.25 SENSITIVE Sensitive     GENTAMICIN <=1 SENSITIVE Sensitive     IMIPENEM <=0.25 SENSITIVE Sensitive     NITROFURANTOIN <=16 SENSITIVE Sensitive     TRIMETH/SULFA <=20 SENSITIVE Sensitive     AMPICILLIN/SULBACTAM <=2 SENSITIVE Sensitive     PIP/TAZO <=4 SENSITIVE Sensitive     Extended ESBL NEGATIVE Sensitive     * >=100,000 COLONIES/mL ESCHERICHIA COLI  SARS Coronavirus 2 (CEPHEID- Performed in [place] [facility] lab), Hosp Order     Status: None   Collection Time: 03/20/19  4:04 PM   Specimen: Nasopharyngeal Swab  Result Value Ref Range Status   SARS Coronavirus 2 NEGATIVE NEGATIVE Final    Comment: (NOTE) If result is NEGATIVE SARS-CoV-2 target nucleic acids are NOT DETECTED. The SARS-CoV-2 RNA is generally detectable in upper and lower  respiratory specimens during the acute phase of infection. The lowest  concentration of SARS-CoV-2 viral copies this assay can detect is 250  copies / mL. A negative result does not preclude SARS-CoV-2 infection  and should not be used as the sole basis for treatment or other   patient management decisions.  A negative result may occur with  improper specimen collection / handling, submission of specimen other  than nasopharyngeal swab, presence of viral mutation(s) within the  areas targeted by this assay, and inadequate number of viral copies  (<250 copies / mL). A negative result must be combined with clinical  observations, patient history, and epidemiological information. If result is POSITIVE SARS-CoV-2 target nucleic acids are DETECTED. The SARS-CoV-2 RNA is generally detectable in upper and lower  respiratory specimens dur ing the acute phase of  infection.  Positive  results are indicative of active infection with SARS-CoV-2.  Clinical  correlation with patient history and other diagnostic information is  necessary to determine patient infection status.  Positive results do  not rule out bacterial infection or co-infection with other viruses. If result is PRESUMPTIVE POSTIVE SARS-CoV-2 nucleic acids MAY BE PRESENT.   A presumptive positive result was obtained on the submitted specimen  and confirmed on repeat testing.  While 2019 novel coronavirus  (SARS-CoV-2) nucleic acids may be present in the submitted sample  additional confirmatory testing may be necessary for epidemiological  and / or clinical management purposes  to differentiate between  SARS-CoV-2 and other Sarbecovirus currently known to infect humans.  If clinically indicated additional testing with an alternate test  methodology [phone]) is advised. The SARS-CoV-2 RNA is generally  detectable in upper and lower respiratory sp ecimens during the acute  phase of infection. The expected result is Negative. Fact Sheet for Patients:  StrictlyIdeas.no Fact Sheet for Healthcare Providers: BankingDealers.co.za This test is not yet approved or cleared by the [place] FDA and has been authorized for detection and/or diagnosis of SARS-CoV-2 by  FDA under an Emergency Use Authorization (EUA).  This EUA will remain in effect (meaning this test can be used) for the duration of the COVID-19 declaration under Section 564(b)(1) of the Act, 21 U.S.C. section 360bbb-3(b)(1), unless the authorization is terminated or revoked sooner. Performed at [facility], [place] [address] [place], [place] [address]   MRSA PCR Screening     Status: None   Collection Time: 03/20/19 11:40 PM   Specimen: Nasal Mucosa; Nasopharyngeal  Result Value Ref Range Status   MRSA by PCR NEGATIVE NEGATIVE Final    Comment:        The GeneXpert MRSA Assay (FDA approved for NASAL specimens only), is one component of a comprehensive MRSA colonization surveillance program. It is not intended to diagnose MRSA infection nor to guide or monitor treatment for MRSA infections. Performed at [facility], [place] [address], [place], [place] [address]      Radiology Studies: No results found. Scheduled Meds: . furosemide  80 mg Oral BID  . metoprolol tartrate  25 mg Oral BID  . oxybutynin  2.5 mg Oral QHS  . spironolactone  12.5 mg Oral Daily  . topiramate  25 mg Oral BID   Continuous Infusions: . cefTRIAXone (ROCEPHIN)  IV 1 g (03/23/19 0851)     LOS: 3 days   Little Ishikawa, MD [facility]  If 7PM-7AM, please contact night-coverage [email or web address] Password [facility] 03/23/2019, 11:31 AM

## 2019-03-23 NOTE — Anesthesia Procedure Notes (Signed)
Procedure Name: MAC Date/Time: 03/23/2019 6:19 PM Performed by: Elayne Snare, CRNA Pre-anesthesia Checklist: Patient identified, Emergency Drugs available, Suction available and Patient being monitored Patient Re-evaluated:Patient Re-evaluated prior to induction Oxygen Delivery Method: Simple face mask

## 2019-03-23 NOTE — TOC Progression Note (Signed)
Transition of Care [facility]) - Progression Note    Patient Details  Name: TONJA JEZEWSKI MRN: [id number] Date of Birth: 1941/08/08  Transition of Care [facility]) CM/SW [place], LCSW Phone Number: 03/23/2019, 10:02 AM  Clinical Narrative: Patient does not have a second preference SNF. Provided list of bed offers for her to review.    Expected Discharge Plan: Skilled Nursing Facility Barriers to Discharge: Ship broker, Continued Medical Work up  Expected Discharge Plan and Services Expected Discharge Plan: [place] Choice: [place] arrangements for the past 2 months: Single Family Home                                       Social Determinants of Health (SDOH) Interventions    Readmission Risk Interventions No flowsheet data found.

## 2019-03-23 NOTE — Plan of Care (Signed)

## 2019-03-23 NOTE — OR Nursing (Signed)
Care of patient assumed at 1909. 

## 2019-03-23 NOTE — Transfer of Care (Signed)
Immediate Anesthesia Transfer of Care Note  Patient: Carrie [name]  Procedure(s) Performed: OPEN REDUCTION INTERNAL FIXATION (ORIF) WRIST FRACTURE (Right )  Patient Location: PACU  Anesthesia Type:MAC combined with regional for post-op pain  Level of Consciousness: awake, alert  and responds to stimulation  Airway & Oxygen Therapy: Patient Spontanous Breathing and Patient connected to face mask oxygen  Post-op Assessment: Report given to RN and Post -op Vital signs reviewed and stable  Post vital signs: Reviewed and stable  Last Vitals:  Vitals Value Taken Time  BP    Temp    Pulse 66 03/23/19 1920  Resp 23 03/23/19 1920  SpO2 99 % 03/23/19 1920  Vitals shown include unvalidated device data.  Last Pain:  Vitals:   03/23/19 1745  TempSrc:   PainSc: 0-No pain         Complications: No apparent anesthesia complications

## 2019-03-23 NOTE — Anesthesia Procedure Notes (Signed)
Anesthesia Regional Block: Supraclavicular block   Pre-Anesthetic Checklist: ,, timeout performed, Correct Patient, Correct Site, Correct Laterality, Correct Procedure, Correct Position, site marked, Risks and benefits discussed,  Surgical consent,  Pre-op evaluation,  At surgeon's request and post-op pain management  Laterality: Right  Prep: chloraprep       Needles:  Injection technique: Single-shot  Needle Type: Stimiplex     Needle Length: 9cm  Needle Gauge: 21     Additional Needles:   Procedures:,,,, ultrasound used (permanent image in chart),,,,  Narrative:  Start time: 03/23/2019 5:30 PM End time: 03/23/2019 5:35 PM Injection made incrementally with aspirations every 5 mL.  Performed by: Personally  Anesthesiologist: Lynda Rainwater, MD

## 2019-03-23 NOTE — Progress Notes (Signed)
Patient's cardiologist is Dr. Sallyanne Kuster.

## 2019-03-23 NOTE — Progress Notes (Signed)
Pt seen and examined, OR delayed b/c eating.  Will proceed with ORIF R distal Radius.

## 2019-03-23 NOTE — Op Note (Signed)
NAME: Carrie [name], Carrie [name] MEDICAL RECORD [id number] ACCOUNT [id number] DATE OF BIRTH:Aug 05, 1941 FACILITY: [facility] LOCATION: [place], [place]  OPERATIVE REPORT  DATE OF PROCEDURE:  03/23/2019  PREOPERATIVE DIAGNOSIS:  Closed displaced fracture of the right distal radius and ulnar styloid.  POSTOPERATIVE DIAGNOSIS:  Closed displaced fracture of the right distal radius and ulnar styloid.  PROCEDURE:  Open reduction internal fixation of right distal radius with an [employer] volar plate.  INDICATIONS:  A 78 year old female fall on outstretched hand with a displaced fracture.  Closed reduction was attempted; however, unsatisfactory reduction was obtained, and therefore open reduction internal fixation was recommended.  Risks, benefits and  alternatives were discussed.  Consent was obtained.  DESCRIPTION OF PROCEDURE:  The patient was taken to the operating room and placed supine on the operating room table.  Prior to OR anesthesia performed an upper extremity block  because of her comorbidities.  She was taken to the operating room and  placed supine.  The right upper extremity was prepped and draped in a normal sterile fashion.  A tourniquet was used on the upper arm.  The arm was exsanguinated and tourniquet was inflated to 250 mmHg.  A volar incision overlying the FCR tendon was then  made.  Dissection was carried down between the FCR tendon and the radial artery, exposing the pronator quadratus muscle.  The proximal portion was taken off of the bone with bipolar.  The distal portion was essentially obliterated due to the fracture.   The fracture site was irrigated.  There were a couple of small pieces of bone that were removed.  Once the fracture was adequately visualized, the reduction was performed.  A small right-sided volar plate was chosen by Acumed, placed, and temporarily  held with K wires.  The plate was adjusted under fluoroscopy.  Next, the radial shaft screws were  each drilled, measured in appropriate size.  Cortical screws were placed.  Next, the distal radius screws were placed.  Each screw was drilled, measured,  and appropriate size threaded.  Locking screws were placed.  Final x-rays revealed near-anatomic reduction.  Good screw length and placement.  The wound was then irrigated with normal saline.  Vicryl 3-0 was used to approximate the deep tissues and  subcutaneous tissues, and a 4-0 Monocryl was used to close the skin.  Tourniquet was released.  Fingers were pink.  A sterile dressing and volar resting splint were placed.  The patient tolerated the procedure well and was taken to recovery room in  stable condition.  LN/NUANCE  D:03/23/2019 T:03/23/2019 JOB:[id number]

## 2019-03-23 NOTE — Anesthesia Preprocedure Evaluation (Addendum)
Anesthesia Evaluation    Reviewed: Allergy & Precautions, Patient's Chart, lab work & pertinent test results, reviewed documented beta blocker date and time , Unable to perform ROS - Chart review only  History of Anesthesia Complications Negative for: history of anesthetic complications  Airway Mallampati: II  TM Distance: >3 FB Neck ROM: Full    Dental no notable dental hx.    Pulmonary    Pulmonary exam normal breath sounds clear to auscultation       Cardiovascular hypertension, Pt. on home beta blockers and Pt. on medications + CAD, + Past MI and +CHF  Normal cardiovascular exam+ dysrhythmias Atrial Fibrillation + pacemaker (hx SSS) + Valvular Problems/Murmurs (TR) MR  Rhythm:Regular Rate:Normal   '20 RHC - RA = 27 (v waves to 40 with ventricularized wave form) RV = 54/18 PA =69/23 (39) PCW = 19 (v = 23) Fick cardiac output/index = 5.6/3.5 PVR = 3.5 WU FA sat = 99% PA sat = 67%, 69% SVC sat = 73% Assessment/Plan: She is well diuresed. She had mild-moderate PAH but main issues is severe TR. She is not candidate for surgical TVR. Could consider experimental TV clip but currently not enrolling patient with pacemaker wires. Continue medical therapy.   '20 TTE - EF 60-65%. Moderately increased left ventricular wall thickness. There is abnormal septal motion consistent with RV pacemaker and right ventricular volume and pressure overload. The right ventricle has moderately reduced systolic function. The cavity was moderately enlarged. LA and RA sizes were severely dilated. A MVR mechanical valve is present in the mitral position. Tricuspid valve regurgitation is moderate-severe. Mild AI and AS.    Neuro/Psych  Headaches, PSYCHIATRIC DISORDERS Anxiety Dementia  Hx SDH  CVA, Residual Symptoms    GI/Hepatic GERD  ,  Endo/Other    Renal/GU CRFRenal disease     Musculoskeletal  (+) Arthritis ,   Abdominal   Peds  Hematology  (+) anemia ,  Thrombocytopenia    Anesthesia Other Findings   Reproductive/Obstetrics                           Anesthesia Physical  Anesthesia Plan  ASA: IV  Anesthesia Plan: MAC and Regional   Post-op Pain Management:    Induction: Intravenous  PONV Risk Score and Plan: 2 and Treatment may vary due to age or medical condition, Ondansetron and Propofol infusion  Airway Management Planned: Natural Airway and Simple Face Mask  Additional Equipment: None  Intra-op Plan:   Post-operative Plan:   Informed Consent:   Plan Discussed with: CRNA and Anesthesiologist  Anesthesia Plan Comments:       Anesthesia Quick Evaluation                                  Anesthesia Evaluation   Patient unresponsive    Reviewed: Allergy & Precautions, Patient's Chart, lab work & pertinent test results, Unable to perform ROS - Chart review onlyPreop documentation limited or incomplete due to emergent nature of procedure.  History of Anesthesia Complications Negative for: history of anesthetic complications  Airway Mallampati: Intubated       Dental   Pulmonary    breath sounds clear to auscultation   + intubated    Cardiovascular hypertension, Pt. on home beta blockers and Pt. on medications + CAD, + Past MI and +CHF  + dysrhythmias Atrial Fibrillation + pacemaker (hx SSS) + Valvular  Problems/Murmurs (TR) MR  Rhythm:Regular Rate:Normal   '20 RHC - RA = 27 (v waves to 40 with ventricularized wave form) RV = 54/18 PA =69/23 (39) PCW = 19 (v = 23) Fick cardiac output/index = 5.6/3.5 PVR = 3.5 WU FA sat = 99% PA sat = 67%, 69% SVC sat = 73% Assessment/Plan: She is well diuresed. She had mild-moderate PAH but main issues is severe TR. She is not candidate for surgical TVR. Could consider experimental TV clip but currently not enrolling patient with pacemaker wires. Continue medical therapy.   '20 TTE - EF 60-65%. Moderately  increased left ventricular wall thickness. There is abnormal septal motion consistent with RV pacemaker and right ventricular volume and pressure overload. The right ventricle has moderately reduced systolic function. The cavity was moderately enlarged. LA and RA sizes were severely dilated. A MVR mechanical valve is present in the mitral position. Tricuspid valve regurgitation is moderate-severe. Mild AI and AS.    Neuro/Psych  Headaches, PSYCHIATRIC DISORDERS Anxiety  Hx SDH  CVA, Residual Symptoms    GI/Hepatic GERD  ,  Endo/Other    Renal/GU CRFRenal disease     Musculoskeletal  (+) Arthritis ,   Abdominal   Peds  Hematology  (+) anemia ,  Thrombocytopenia    Anesthesia Other Findings   Reproductive/Obstetrics                            Anesthesia Physical Anesthesia Plan  ASA: IV and emergent  Anesthesia Plan: General   Post-op Pain Management:    Induction: Inhalational  PONV Risk Score and Plan: 3 and Treatment may vary due to age or medical condition and Ondansetron  Airway Management Planned: Oral ETT  Additional Equipment: Arterial line  Intra-op Plan:   Post-operative Plan: Post-operative intubation/ventilation  Informed Consent:     Only emergency history available and History available from chart only  Plan Discussed with: CRNA and Anesthesiologist  Anesthesia Plan Comments:        Anesthesia Quick Evaluation

## 2019-03-23 NOTE — Anesthesia Postprocedure Evaluation (Signed)
Anesthesia Post Note  Patient: CICLALY MULCAHEY  Procedure(s) Performed: OPEN REDUCTION INTERNAL FIXATION (ORIF) WRIST FRACTURE (Right Wrist)     Patient location during evaluation: PACU Anesthesia Type: Regional Level of consciousness: awake Pain management: pain level controlled Vital Signs Assessment: post-procedure vital signs reviewed and stable Respiratory status: spontaneous breathing, nonlabored ventilation, respiratory function stable and patient connected to nasal cannula oxygen Cardiovascular status: stable and blood pressure returned to baseline Postop Assessment: no apparent nausea or vomiting Anesthetic complications: no    Last Vitals:  Vitals:   03/23/19 2015 03/23/19 2041  BP: 106/66 115/67  Pulse: 81 96  Resp: (!) 22 (!) 21  Temp: (!) 36.2 C (!) 36.4 C  SpO2: 100% 99%    Last Pain:  Vitals:   03/23/19 2041  TempSrc: Oral  PainSc:                  Karyl Kinnier Jubal Rademaker

## 2019-03-24 LAB — BASIC METABOLIC PANEL
Anion gap: 9 (ref 5–15)
BUN: 26 mg/dL — ABNORMAL HIGH (ref 8–23)
CO2: 25 mmol/L (ref 22–32)
Calcium: 9.6 mg/dL (ref 8.9–10.3)
Chloride: 104 mmol/L (ref 98–111)
Creatinine, Ser: 0.92 mg/dL (ref 0.44–1.00)
GFR calc Af Amer: >60 mL/min (ref >60)
GFR calc non Af Amer: >60 mL/min (ref >60)
Glucose, Bld: 108 mg/dL — ABNORMAL HIGH (ref 70–99)
Potassium: 3.7 mmol/L (ref 3.5–5.1)
Sodium: 138 mmol/L (ref 135–145)

## 2019-03-24 LAB — CBC
HCT: 24.6 % — ABNORMAL LOW (ref 36.0–46.0)
Hemoglobin: 7.9 g/dL — ABNORMAL LOW (ref 12.0–15.0)
MCH: 29.6 pg (ref 26.0–34.0)
MCHC: 32.1 g/dL (ref 30.0–36.0)
MCV: 92.1 fL (ref 80.0–100.0)
Platelets: 186 10*3/uL (ref 150–400)
RBC: 2.67 MIL/uL — ABNORMAL LOW (ref 3.87–5.11)
RDW: 34.5 % — ABNORMAL HIGH (ref 11.5–15.5)
WBC: 7.6 10*3/uL (ref 4.0–10.5)
nRBC: 0.3 % — ABNORMAL HIGH (ref 0.0–0.2)

## 2019-03-24 LAB — HEPARIN LEVEL (UNFRACTIONATED): Heparin Unfractionated: <0.1 IU/mL — ABNORMAL LOW (ref 0.30–0.70)

## 2019-03-24 LAB — PROTIME-INR
INR: 1.3 — ABNORMAL HIGH (ref 0.8–1.2)
Prothrombin Time: 16.3 seconds — ABNORMAL HIGH (ref 11.4–15.2)

## 2019-03-24 LAB — MAGNESIUM: Magnesium: 2.3 mg/dL (ref 1.7–2.4)

## 2019-03-24 MED ORDER — METOPROLOL TARTRATE 5 MG/5ML IV SOLN
5.0000 mg | Freq: Once | INTRAVENOUS | Status: AC
  Administered 2019-03-24: 06:00:00 5 mg via INTRAVENOUS
  Filled 2019-03-24: qty 5

## 2019-03-24 MED ORDER — WARFARIN SODIUM 5 MG PO TABS
5.0000 mg | ORAL_TABLET | Freq: Once | ORAL | Status: AC
  Administered 2019-03-24: 5 mg via ORAL
  Filled 2019-03-24: qty 1

## 2019-03-24 MED ORDER — HEPARIN (PORCINE) 25000 UT/250ML-% IV SOLN
1000.0000 [IU]/h | INTRAVENOUS | Status: DC
  Administered 2019-03-24: 14:00:00 700 [IU]/h via INTRAVENOUS
  Administered 2019-03-25 – 2019-03-27 (×2): 1000 [IU]/h via INTRAVENOUS
  Filled 2019-03-24 (×4): qty 250

## 2019-03-24 MED ORDER — WARFARIN - PHARMACIST DOSING INPATIENT
Freq: Every day | Status: DC
  Administered 2019-03-25 – 2019-03-27 (×3)

## 2019-03-24 NOTE — Progress Notes (Signed)
ANTICOAGULATION CONSULT NOTE - Follow-Up Consult  Pharmacy Consult for heparin Indication: mechanical mitral valve, atrial fibrillation  Allergies  Allergen Reactions  . Codeine Nausea And Vomiting    Patient Measurements: Height: 5\' 1"  (154.9 cm) Weight: 116 lb 13.5 oz (53 kg) IBW/kg (Calculated) : 47.8 Heparin Dosing Weight: 54kg  Vital Signs: Temp: 99.1 F (37.3 C) (07/03 0728) Temp Source: Oral (07/03 1117) BP: 115/73 (07/03 1029) Pulse Rate: 75 (07/03 1117)  Labs: Recent Labs    03/22/19 0321 03/22/19 1209 03/22/19 2108 03/23/19 0245 03/24/19 0420 03/24/19 1815  HGB 8.0*  --   --  8.4* 7.9*  --   HCT 24.4*  --   --  26.5* 24.6*  --   PLT 187  --   --  173 186  --   LABPROT 16.8*  --   --  15.9* 16.3*  --   INR 1.4*  --   --  1.3* 1.3*  --   HEPARINUNFRC  --  <0.10* <0.10*  --   --  <0.10*  CREATININE 1.16*  --   --  1.00 0.92  --     Estimated Creatinine Clearance: 38.6 mL/min (by C-G formula based on SCr of 0.92 mg/dL).  Assessment: 66 yoF on warfarin at home for hx of AFib and mechanical MVR. Pt admitted s/p fall with  plans for surgery, however pt received inadvertent heparin overdose on 6/29 pm. Pt received protamine and vitamin K for INR of 2.3, with heparin reversed. Heparin infusion resumed but phlebotomy unable to draw heparin levels for monitoring, so heparin converted to VTE prophylaxis dosing in anticipation of surgery 7/1 given inability to monitor safely after overdose.   Pt status post wrist surgery on 7/2. Per MD, ok to resume heparin infusion and warfarin. INR down to 1.3 this morning, H/H stable. No DDI noted.  PM heparin level <0.1, will adjust upward. No issues per RN with drip.   Goal of Therapy:  Heparin level 0.3-0.5 units/ml Monitor platelets by anticoagulation protocol: Yes INR goal 2.5-3.0    Plan:  Increase heparin infusion to 850 units/hr Initiate warfarin 5 mg today Heparin level In 8 hours Daily INR  Jaymen Fetch A. Levada Dy,  PharmD, [place] Please utilize Amion for appropriate phone number to reach the unit pharmacist ([place])    03/24/2019 6:58 PM

## 2019-03-24 NOTE — Plan of Care (Signed)

## 2019-03-24 NOTE — TOC Progression Note (Signed)
Transition of Care [facility]) - Progression Note    Patient Details  Name: Carrie [name] MRN: [id number] Date of Birth: 1941/03/05  Transition of Care [facility]) CM/SW [place], LCSW Phone Number: 03/24/2019, 10:24 AM  Clinical Narrative: Discussed bed offers with husband. He has accepted bed offer at [facility]. Admissions coordinator is aware. Patient will need a new COVID test. Tests have to be done within 3 days of discharge.     Expected Discharge Plan: Skilled Nursing Facility Barriers to Discharge: Ship broker, Continued Medical Work up  Expected Discharge Plan and Services Expected Discharge Plan: [place] Choice: [place] arrangements for the past 2 months: Single Family Home                                       Social Determinants of Health (SDOH) Interventions    Readmission Risk Interventions No flowsheet data found.

## 2019-03-24 NOTE — Progress Notes (Addendum)
ANTICOAGULATION CONSULT NOTE - Follow-Up Consult  Pharmacy Consult for heparin Indication: mechanical mitral valve, atrial fibrillation  Allergies  Allergen Reactions  . Codeine Nausea And Vomiting    Patient Measurements: Height: 5\' 1"  (154.9 cm) Weight: 116 lb 13.5 oz (53 kg) IBW/kg (Calculated) : 47.8 Heparin Dosing Weight: 54kg  Vital Signs: Temp: 99.1 F (37.3 C) (07/03 0728) Temp Source: Oral (07/03 1117) BP: 115/73 (07/03 1029) Pulse Rate: 75 (07/03 1117)  Labs: Recent Labs    03/22/19 0321 03/22/19 1209 03/22/19 2108 03/23/19 0245 03/24/19 0420  HGB 8.0*  --   --  8.4* 7.9*  HCT 24.4*  --   --  26.5* 24.6*  PLT 187  --   --  173 186  LABPROT 16.8*  --   --  15.9* 16.3*  INR 1.4*  --   --  1.3* 1.3*  HEPARINUNFRC  --  <0.10* <0.10*  --   --   CREATININE 1.16*  --   --  1.00 0.92    Estimated Creatinine Clearance: 38.6 mL/min (by C-G formula based on SCr of 0.92 mg/dL).  Assessment: 3 yoF on warfarin at home for hx of AFib and mechanical MVR. Pt admitted s/p fall with  plans for surgery, however pt received inadvertent heparin overdose on 6/29 pm. Pt received protamine and vitamin K for INR of 2.3, with heparin reversed. Heparin infusion resumed but phlebotomy unable to draw heparin levels for monitoring, so heparin converted to VTE prophylaxis dosing in anticipation of surgery 7/1 given inability to monitor safely after overdose.   Pt status post wrist surgery on 7/2. Per MD, ok to resume heparin infusion and warfarin. INR down to 1.3 this morning, H/H stable. No DDI noted.  Goal of Therapy:  Heparin level 0.3-0.5 units/ml Monitor platelets by anticoagulation protocol: Yes INR goal 2.5-3.0    Plan:  Initiate heparin infusion 700 units/hr Initiate warfarin 5 mg today Heparin level 7/3 @1830  Daily INR  Berenice Bouton, PharmD PGY1 Pharmacy Resident  Please check AMION for all [place] numbers  03/24/2019 12:02 PM

## 2019-03-24 NOTE — Progress Notes (Signed)
Physical Therapy Treatment Patient Details Name: Carrie [name] MRN: [id number] DOB: 1941/07/18 Today's Date: 03/24/2019    History of Present Illness 78 y.o. 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  Admitted on 03/20/19 following a fall and was found to have R wrist fx. Scheduled for surgery 6/30 currently postponed. ORIF right wrist on 7/2.      PT Comments    Pt admitted with above diagnosis. Pt currently with functional limitations due to the deficits listed below (see PT Problem List). Pt was able to take pivotal steps to recliner with mod assist of 2 persons today. Pt with flat affect and not very talkative with therapy.  Pt performed a few exercises once in chair.  Pt will benefit from skilled PT to increase their independence and safety with mobility to allow discharge to the venue listed below.     Follow Up Recommendations  SNF     Equipment Recommendations  Other (comment)(TBD at next venue)    Recommendations for Other Services OT consult     Precautions / Restrictions Precautions Precautions: Fall Restrictions Weight Bearing Restrictions: No RUE Weight Bearing: Non weight bearing    Mobility  Bed Mobility Overal bed mobility: Needs Assistance Bed Mobility: Supine to Sit;Sit to Supine     Supine to sit: Mod assist Sit to supine: Max assist   General bed mobility comments: mod assist to transition to EOB using pad to scoot hips, max assist to return to supine for LB and trunk support   Transfers Overall transfer level: Needs assistance Equipment used: 1 person hand held assist Transfers: Sit to/from [facility] Sit to Stand: Mod assist;+2 safety/equipment Stand pivot transfers: +2 safety/equipment;Mod  assist       General transfer comment: Pt stood with mod assist of 2 and pivoted to recliner with mod assist needing assist to move her LEs for pivot.  Pt in somewhat of a flexed posture.  Ambulation/Gait         Gait velocity: significantly decreased        Stairs             Wheelchair Mobility    Modified Rankin (Stroke Patients Only)       Balance Overall balance assessment: Needs assistance Sitting-balance support: No upper extremity supported;Feet supported Sitting balance-Leahy Scale: Fair     Standing balance support: Bilateral upper extremity supported Standing balance-Leahy Scale: Zero Standing balance comment: Had support at right elbow and HHa on left for support with somewhat flexed posture.                             Cognition Arousal/Alertness: Awake/alert Behavior During Therapy: Flat affect Overall Cognitive Status: Impaired/Different from baseline Area of Impairment: Following commands;Safety/judgement;Awareness;Problem solving                       Following Commands: Follows one step commands with increased time Safety/Judgement: Decreased awareness of safety Awareness: Emergent Problem Solving: Slow processing;Decreased initiation;Difficulty sequencing;Requires verbal cues;Requires tactile cues General Comments: pt with flat affect and 1 word responses, very slow processing noted      Exercises General Exercises - Lower Extremity Ankle Circles/Pumps: AROM;Both;10 reps;Supine Long Arc Quad: AROM;Both;10 reps;Seated Hip Flexion/Marching: AROM;Both;10 reps;Seated    General Comments General comments (  skin integrity, edema, etc.): Pt desat if O2 removed to 85%, left on 4LO2 and sats >90%.       Pertinent Vitals/Pain Pain Assessment: Faces Faces Pain Scale: Hurts little more Pain Location: R wrist Pain Descriptors / Indicators: Guarding;Grimacing Pain Intervention(s): Limited activity within patient's  tolerance;Monitored during session;Repositioned    Home Living                      Prior Function            PT Goals (current goals can now be found in the care plan section) Acute Rehab PT Goals Patient Stated Goal: to lay back down Progress towards PT goals: Progressing toward goals    Frequency    Min 2X/week      PT Plan Current plan remains appropriate    Co-evaluation              AM-PAC PT "6 Clicks" Mobility   Outcome Measure  Help needed turning from your back to your side while in a flat bed without using bedrails?: A Little Help needed moving from lying on your back to sitting on the side of a flat bed without using bedrails?: A Lot Help needed moving to and from a bed to a chair (including a wheelchair)?: A Lot Help needed standing up from a chair using your arms (e.g., wheelchair or bedside chair)?: A Lot Help needed to walk in hospital room?: A Lot Help needed climbing 3-5 steps with a railing? : Total 6 Click Score: 12    End of Session Equipment Utilized During Treatment: Gait belt;Oxygen Activity Tolerance: Patient limited by fatigue(orthostatics) Patient left: in chair;with call bell/phone within reach;with chair alarm set Nurse Communication: Mobility status PT Visit Diagnosis: Unsteadiness on feet (R26.81);Other abnormalities of gait and mobility (R26.89);Muscle weakness (generalized) (M62.81);Difficulty in walking, not elsewhere classified (R26.2);Dizziness and giddiness (R42);History of falling (Z91.81);Repeated falls (R29.6);Pain Pain - Right/Left: Right Pain - part of body: (wrist)     Time: 3500-9381 PT Time Calculation (min) (ACUTE ONLY): 17 min  Charges:  $Therapeutic Activity: 8-22 mins                     Tanasia Budzinski,PT Acute Rehabilitation Services Pager:  [phone]  Office:  [place] 03/24/2019, 3:44 PM

## 2019-03-24 NOTE — Progress Notes (Signed)
PROGRESS NOTE    Carrie [name]  CBS:[id number] DOB: [birth year] DOA: 03/20/2019 PCP: Lucianne Lei, MD   Brief Narrative:  78 y.o. 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 She denies that she had chest pain, palpitation, dizziness.  She denies that she had loss of consciousness.  She states she "just fell".  She does not think she hit her head.  She did have wrist pain and understands that she needs to have surgery in the morning.  At present she has no acute complaints other than that she is hungry and thirsty.  She feels her pain is well controlled. She denies any recent illnesses.  Denies any recent fevers or chills.  Denies cough.  Denies nausea vomiting or diarrhea.  Denies abdominal pain.  Denies dysuria. The patient was noted to have a right wrist fracture. She was seen by orthopedics who request [facility] hospital[facility] admit given multiple medical.  Subjective: Admits to ongoing right wrist pain (although moderately improved from yesterday); declines headache, fever, chills, nausea, vomiting, diarrhea, constipation.  Assessment & Plan:   Principal Problem:   Right wrist fracture Active Problems:   Acute lower UTI   Atrial fibrillation (HCC)   S/P mitral valve replacement, [employer]   Chronic anticoagulation, ( INR goal 2.0-2.5 due to history of subdural hematoma and liver hemorrhage)   Essential hypertension   SSS (sick sinus syndrome) (HCC)   Longstanding persistent atrial fibrillation   Pacemaker   NSTEMI (non-ST elevated myocardial infarction) (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 Dayna Barker appreciate insight and recommendation Tolerated procedure well -pain currently well controlled on current regimen Continue heparin drip and resume Coumadin, orthopedics agreed  Acute UTI, POA Urine culture remarkable for E. coli, pan sensitive Ceftriaxone x3 doses - last dose 03/24/19 Likely the cause of patient's ambulatory dysfunction and fall with resulting fracture  Mechanical fall Syncope ruled out per discussion with husband Patient remains unclear about how she fell although husband indicates this was a mechanical slip and fall in the bathroom at home -attempting to ambulate without walker which is her baseline  Echo is grossly unchanged from prior per report - severe TR, EF 84-66%  Chronic Diastolic CHF/sick sinus syndrome, not in acute exacerbation I's and O's, daily weight stable  Continue home Lasix 80 mg BID, Metoprolol 25 mg BID, Spironolactone 12.5 mg daily  Atrial fibrillation, rate controlled Home warfarin resumed postoperatively 03/24/2019 Monitor INR - 1.5 > 1.8 > 2.3 > 1.4 Vitamin K - 3 mg previously given Continue heparin until therapeutic  Heparin overdose, resolved Heparin gtt was bolused previously with elevated APTT to >200; PTT 25 on 6/30 Resume heparin now and follow closely as above  CAD Stable, see CHF  CKD stage III baselineCr~1.32) Monitor closely Creatinine currently WNL  Dementia Secondary to multiple strokes and previous subdural hematoma. Per husband baseline appears to be  alert to person and able to answer general simple questions about current situation (pain, hungry, etc).  DVT prophylaxis: Heparin Code Status: Full Family Communication: Husband was  called twice by myself today, no answer, case management had spoken to the patient's husband earlier today however about disposition plan he appears to be agreeable.  Disposition Plan: Remains inpatient, patient now postop with fracture wrist repair as above.  Patient is on heparin drip to bridge with Coumadin.  Currently PT commending SNF placement, husband agreeable, unfortunately given the holiday weekend patient will likely be here until through the sixth or seventh, will order COVID swab to be taken the morning of the fifth in hopes that this will cover Monday Tuesday and Wednesday for possible discharge dates as we await insurance approval Consultants  Dr. Dayna Barker Surgery, Ortho   Anti-infectives (From admission, onward)   Start     Stop   03/21/19 0600  ceFAZolin (ANCEF) IVPB 2g/100 mL premix  Status:  Discontinued     03/20/19 2338     Objective: Vitals:   03/23/19 2206 03/23/19 2308 03/24/19 0315 03/24/19 0728  BP:  110/62 116/67 118/67  Pulse: 68 80 72 97  Resp: 19 20 (!) 22 (!) 23  Temp:  98.9 F (37.2 C) 98.6 F (37 C) 99.1 F (37.3 C)  TempSrc:  Axillary Axillary Axillary  SpO2: 100% 98% 98% 100%  Weight:   53 kg   Height:        Intake/Output Summary (Last 24 hours) at 03/24/2019 1033 Last data filed at 03/24/2019 0815 Gross per 24 hour  Intake 720 ml  Output 305 ml  Net 415 ml   Filed Weights   03/23/19 0147 03/23/19 1523 03/24/19 0315  Weight: 55.3 kg 55.3 kg 53 kg    Examination: General:  Pleasantly resting in bed, No acute distress. Alert to person and general situation only. HEENT:  Normocephalic atraumatic.  Sclerae nonicteric, noninjected.  Extraocular movements intact bilaterally. Neck:  Without mass or deformity.  Trachea is midline. Lungs:  Clear to auscultate bilaterally without rhonchi, wheeze, or rales. Heart:  Regular rate and rhythm.  Blowing systolic murmur 4/6, otherwise without rubs, or gallops. Abdomen:  Soft, nontender, nondistended.   Without guarding or rebound. Extremities: Without cyanosis, clubbing, edema, right wrist bandage clean dry intact; fingers distal to bandage somewhat edematous but nonpitting Vascular:  Dorsalis pedis and posterior tibial pulses palpable bilaterally. Skin:  Warm and dry, no erythema, no ulcerations.  CBC: Recent Labs  Lab 03/20/19 1325 03/21/19 0125 03/22/19 0321 03/23/19 0245 03/24/19 0420  WBC 5.6 6.3 6.9 7.5 7.6  NEUTROABS 3.7  --   --   --   --   HGB 8.0* 8.0* 8.0* 8.4* 7.9*  HCT 25.8* 24.1* 24.4* 26.5* 24.6*  MCV 92.8 91.3 90.4 92.7 92.1  PLT 188 174 187 173 010   Basic Metabolic Panel: Recent Labs  Lab 03/20/19 1325 03/21/19 0125 03/22/19 0321 03/23/19 0245 03/24/19 0420  NA 140 137 135 136 138  K 4.0 3.8 3.5 3.8 3.7  CL 105 104 99 102 104  CO2 24 24 25 23 25   GLUCOSE 104* 116* 107* 110* 108*  BUN 38* 35* 32* 27* 26*  CREATININE 1.46* 1.34* 1.16* 1.00 0.92  CALCIUM 9.9 9.5 9.6 9.4 9.6  MG  --   --  2.3 2.3 2.3   GFR: Estimated Creatinine Clearance: 38.6 mL/min (by C-G formula based on SCr of 0.92 mg/dL). Liver Function Tests: No results for input(s): AST,  ALT, ALKPHOS, BILITOT, PROT, ALBUMIN in the last 168 hours. No results for input(s): LIPASE, AMYLASE in the last 168 hours. No results for input(s): AMMONIA in the last 168 hours. Coagulation Profile: Recent Labs  Lab 03/20/19 1325 03/21/19 0125 03/22/19 0321 03/23/19 0245 03/24/19 0420  INR 1.8* 2.3* 1.4* 1.3* 1.3*   Cardiac Enzymes: No results for input(s): CKTOTAL, CKMB, CKMBINDEX, TROPONINI in the last 168 hours. BNP (last 3 results) No results for input(s): PROBNP in the last 8760 hours. HbA1C: No results for input(s): HGBA1C in the last 72 hours. CBG: No results for input(s): GLUCAP in the last 168 hours. Lipid Profile: No results for input(s): CHOL, HDL, LDLCALC, TRIG, CHOLHDL, LDLDIRECT in the last 72 hours. Thyroid Function Tests: Recent Labs    03/22/19 0321  TSH 2.625   Anemia  Panel: No results for input(s): VITAMINB12, FOLATE, FERRITIN, TIBC, IRON, RETICCTPCT in the last 72 hours. Urine analysis:    Component Value Date/Time   COLORURINE YELLOW 03/20/2019 1404   APPEARANCEUR HAZY (A) 03/20/2019 1404   LABSPEC 1.009 03/20/2019 1404   PHURINE 7.0 03/20/2019 1404   GLUCOSEU NEGATIVE 03/20/2019 1404   HGBUR NEGATIVE 03/20/2019 1404   BILIRUBINUR NEGATIVE 03/20/2019 1404   KETONESUR NEGATIVE 03/20/2019 1404   PROTEINUR NEGATIVE 03/20/2019 1404   UROBILINOGEN 0.2 03/15/2013 1323   NITRITE NEGATIVE 03/20/2019 1404   LEUKOCYTESUR SMALL (A) 03/20/2019 1404    Recent Results (from the past 240 hour(s))  Urine culture     Status: Abnormal   Collection Time: 03/20/19  3:29 PM   Specimen: Urine, Catheterized  Result Value Ref Range Status   Specimen Description URINE, CATHETERIZED  Final   Special Requests   Final    NONE Performed at [facility], [place] [address] [place], [place] [address]    Culture >=100,000 COLONIES/mL ESCHERICHIA COLI (A)  Final   Report Status 03/22/2019 FINAL  Final   Organism ID, Bacteria ESCHERICHIA COLI (A)  Final      Susceptibility   Escherichia coli - MIC*    AMPICILLIN <=2 SENSITIVE Sensitive     CEFAZOLIN <=4 SENSITIVE Sensitive     CEFTRIAXONE <=1 SENSITIVE Sensitive     CIPROFLOXACIN <=0.25 SENSITIVE Sensitive     GENTAMICIN <=1 SENSITIVE Sensitive     IMIPENEM <=0.25 SENSITIVE Sensitive     NITROFURANTOIN <=16 SENSITIVE Sensitive     TRIMETH/SULFA <=20 SENSITIVE Sensitive     AMPICILLIN/SULBACTAM <=2 SENSITIVE Sensitive     PIP/TAZO <=4 SENSITIVE Sensitive     Extended ESBL NEGATIVE Sensitive     * >=100,000 COLONIES/mL ESCHERICHIA COLI  SARS Coronavirus 2 (CEPHEID- Performed in [place] hospital lab), Hosp Order     Status: None   Collection Time: 03/20/19  4:04 PM   Specimen: Nasopharyngeal Swab  Result Value Ref Range Status   SARS Coronavirus 2 NEGATIVE NEGATIVE Final    Comment: (NOTE) If result is  NEGATIVE SARS-CoV-2 target nucleic acids are NOT DETECTED. The SARS-CoV-2 RNA is generally detectable in upper and lower  respiratory specimens during the acute phase of infection. The lowest  concentration of SARS-CoV-2 viral copies this assay can detect is 250  copies / mL. A negative result does not preclude SARS-CoV-2 infection  and should not be used as the sole basis for treatment or other  patient management decisions.  A negative result may occur with  improper specimen collection / handling, submission of specimen other  than nasopharyngeal swab, presence of viral mutation(s) within the  areas targeted by this assay, and inadequate number of viral copies  (<250 copies / mL). A negative result must be combined with clinical  observations, patient history, and epidemiological information. If result is POSITIVE SARS-CoV-2 target nucleic acids are DETECTED. The SARS-CoV-2 RNA is generally detectable in upper and lower  respiratory specimens dur ing the acute phase of infection.  Positive  results are indicative of active infection with SARS-CoV-2.  Clinical  correlation with patient history and other diagnostic information is  necessary to determine patient infection status.  Positive results do  not rule out bacterial infection or co-infection with other viruses. If result is PRESUMPTIVE POSTIVE SARS-CoV-2 nucleic acids MAY BE PRESENT.   A presumptive positive result was obtained on the submitted specimen  and confirmed on repeat testing.  While 2019 novel coronavirus  (SARS-CoV-2) nucleic acids may be present in the submitted sample  additional confirmatory testing may be necessary for epidemiological  and / or clinical management purposes  to differentiate between  SARS-CoV-2 and other Sarbecovirus currently known to infect humans.  If clinically indicated additional testing with an alternate test  methodology [phone]) is advised. The SARS-CoV-2 RNA is generally  detectable  in upper and lower respiratory sp ecimens during the acute  phase of infection. The expected result is Negative. Fact Sheet for Patients:  StrictlyIdeas.no Fact Sheet for Healthcare Providers: BankingDealers.co.za This test is not yet approved or cleared by the [place] FDA and has been authorized for detection and/or diagnosis of SARS-CoV-2 by FDA under an Emergency Use Authorization (EUA).  This EUA will remain in effect (meaning this test can be used) for the duration of the COVID-19 declaration under Section 564(b)(1) of the Act, 21 U.S.C. section 360bbb-3(b)(1), unless the authorization is terminated or revoked sooner. Performed at [facility], [place] [address], [place], [place] [address]   MRSA PCR Screening     Status: None   Collection Time: 03/20/19 11:40 PM   Specimen: Nasal Mucosa; Nasopharyngeal  Result Value Ref Range Status   MRSA by PCR NEGATIVE NEGATIVE Final    Comment:        The GeneXpert MRSA Assay (FDA approved for NASAL specimens only), is one component of a comprehensive MRSA colonization surveillance program. It is not intended to diagnose MRSA infection nor to guide or monitor treatment for MRSA infections. Performed at [facility], [place] [address] [place], [place] [address]      Radiology Studies: No results found. Scheduled Meds: . furosemide  80 mg Oral BID  . metoprolol tartrate  25 mg Oral BID  . oxybutynin  2.5 mg Oral QHS  . spironolactone  12.5 mg Oral Daily  . topiramate  25 mg Oral BID   LOS: 4 days   Little Ishikawa, MD [facility]  If 7PM-7AM, please contact night-coverage [email or web address] Password [facility] 03/24/2019, 10:33 AM

## 2019-03-24 NOTE — Care Management Important Message (Signed)
Important Message  Patient Details  Name: Carrie [name] MRN: [id number] Date of Birth: [birth year]   Medicare Important Message Given:  Yes     Memory Argue 03/24/2019, 2:38 PM

## 2019-03-25 LAB — CBC
HCT: 24.1 % — ABNORMAL LOW (ref 36.0–46.0)
Hemoglobin: 8 g/dL — ABNORMAL LOW (ref 12.0–15.0)
MCH: 31 pg (ref 26.0–34.0)
MCHC: 33.2 g/dL (ref 30.0–36.0)
MCV: 93.4 fL (ref 80.0–100.0)
Platelets: 167 10*3/uL (ref 150–400)
RBC: 2.58 MIL/uL — ABNORMAL LOW (ref 3.87–5.11)
RDW: 32.6 % — ABNORMAL HIGH (ref 11.5–15.5)
WBC: 7 10*3/uL (ref 4.0–10.5)
nRBC: 0.4 % — ABNORMAL HIGH (ref 0.0–0.2)

## 2019-03-25 LAB — BASIC METABOLIC PANEL
Anion gap: 11 (ref 5–15)
BUN: 28 mg/dL — ABNORMAL HIGH (ref 8–23)
CO2: 23 mmol/L (ref 22–32)
Calcium: 9.2 mg/dL (ref 8.9–10.3)
Chloride: 100 mmol/L (ref 98–111)
Creatinine, Ser: 0.99 mg/dL (ref 0.44–1.00)
GFR calc Af Amer: >60 mL/min (ref >60)
GFR calc non Af Amer: 55 mL/min — ABNORMAL LOW (ref >60)
Glucose, Bld: 103 mg/dL — ABNORMAL HIGH (ref 70–99)
Potassium: 3.5 mmol/L (ref 3.5–5.1)
Sodium: 134 mmol/L — ABNORMAL LOW (ref 135–145)

## 2019-03-25 LAB — PROTIME-INR
INR: 1.4 — ABNORMAL HIGH (ref 0.8–1.2)
Prothrombin Time: 17 seconds — ABNORMAL HIGH (ref 11.4–15.2)

## 2019-03-25 LAB — HEPARIN LEVEL (UNFRACTIONATED)
Heparin Unfractionated: 0.1 IU/mL — ABNORMAL LOW (ref 0.30–0.70)
Heparin Unfractionated: 0.32 IU/mL (ref 0.30–0.70)

## 2019-03-25 LAB — MAGNESIUM: Magnesium: 2.2 mg/dL (ref 1.7–2.4)

## 2019-03-25 MED ORDER — WARFARIN SODIUM 5 MG PO TABS
5.0000 mg | ORAL_TABLET | Freq: Once | ORAL | Status: AC
  Administered 2019-03-25: 5 mg via ORAL
  Filled 2019-03-25: qty 1

## 2019-03-25 NOTE — Progress Notes (Signed)
PROGRESS NOTE    Carrie [name]  CZY:[id number] DOB: [birth year] DOA: 03/20/2019 PCP: Lucianne Lei, MD   Brief Narrative:  78 y.o. 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 She denies that she had chest pain, palpitation, dizziness.  She denies that she had loss of consciousness.  She states she "just fell".  She does not think she hit her head.  She did have wrist pain and understands that she needs to have surgery in the morning.  At present she has no acute complaints other than that she is hungry and thirsty.  She feels her pain is well controlled. She denies any recent illnesses.  Denies any recent fevers or chills.  Denies cough.  Denies nausea vomiting or diarrhea.  Denies abdominal pain.  Denies dysuria. The patient was noted to have a right wrist fracture. She was seen by orthopedics who request [facility] hospitalist admit given multiple medical.  Subjective: No acute issues or events overnight pain well controlled, right arm swelling improving, otherwise declines headache, fever, chills, nausea, vomiting, diarrhea, constipation.  Assessment & Plan:   Principal Problem:   Right wrist fracture Active Problems:   Acute lower UTI   Atrial fibrillation (HCC)   S/P mitral valve replacement, [employer]   Chronic anticoagulation, ( INR goal 2.0-2.5 due to history of subdural hematoma and liver hemorrhage)   Essential hypertension   SSS (sick sinus syndrome) (HCC)   Longstanding persistent atrial fibrillation   Pacemaker   NSTEMI (non-ST elevated myocardial infarction) ([place])   PAH (pulmonary artery hypertension) ([place])   Hypertensive heart disease with heart failure (HCC)   Chronic diastolic heart failure (HCC)   CKD (chronic kidney disease), stage  III (HCC)   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 Dayna Barker appreciate insight and recommendation Tolerated procedure well -pain currently well controlled on current regimen Continue heparin drip and resume Coumadin, orthopedics agreed PT OT ongoing given patient's baseline ambulatory dysfunction requires rolling walker may be difficult to navigate with a fractured wrist  Acute UTI, POA, Resolved Urine culture remarkable for E. coli, pan sensitive Ceftriaxone x3 doses - last dose 03/24/19 Likely exacerbating patient's ambulatory dysfunction and fall with resulting fracture  Mechanical fall Syncope ruled out per discussion with husband Patient remains unclear about how she fell although husband indicates this was a mechanical slip and fall in the bathroom at home -attempting to ambulate without walker which is her baseline  Echo is grossly unchanged from prior per report - severe TR, EF 09-32%  Chronic Diastolic CHF/sick sinus syndrome, not in acute exacerbation I's and O's, daily weight stable  Continue home Lasix 80 mg BID, Metoprolol 25 mg BID, Spironolactone 12.5 mg daily  Atrial fibrillation, rate controlled Home warfarin resumed postoperatively 03/24/2019 Monitor INR - 1.5 > 1.8 > 2.3 > 1.4 Vitamin K - 3 mg previously given Continue heparin until therapeutic  Heparin overdose, resolved Heparin gtt was bolused previously with elevated APTT to >200; PTT 25 on 6/30 Resume heparin now and follow closely as above  CAD Stable, see CHF  CKD stage III baselineCr~1.32) Monitor closely Creatinine currently WNL  Dementia Secondary to multiple strokes and previous subdural hematoma. Per husband baseline appears to be alert to  person and able to answer general simple questions about current situation (pain, hungry, etc).  DVT prophylaxis: Heparin Code Status: Full Family Communication: Husband was called twice by myself today, no answer, case management had spoken to the patient's husband earlier today however about disposition plan he appears to be agreeable.  Disposition Plan: Remains inpatient, patient now postop with fracture wrist repair as above.  Patient is on heparin drip to bridge with Coumadin.  Currently PT commending SNF placement, husband agreeable, unfortunately given the holiday weekend patient will likely be here until through the sixth or seventh, will order COVID swab to be taken the morning of the fifth in hopes that this will cover Monday, Tuesday, and Wednesday for possible discharge dates as we await insurance approval  Consultants  Dr. Dayna Barker [facility]   Anti-infectives (From admission, onward)   Start     Stop   03/21/19 0600  ceFAZolin (ANCEF) IVPB 2g/100 mL premix  Status:  Discontinued     03/20/19 2338     Objective: Vitals:   03/25/19 0355 03/25/19 0400 03/25/19 0500 03/25/19 0806  BP: 97/71 (!) 104/59  (!) 122/58  Pulse: 73 (!) 42    Resp: 17 20    Temp: 98 F (36.7 C)   (!) 97.5 F (36.4 C)  TempSrc: Oral   Oral  SpO2: 99%   95%  Weight:   48 kg   Height:        Intake/Output Summary (Last 24 hours) at 03/25/2019 1011 Last data filed at 03/25/2019 0806 Gross per 24 hour  Intake 717.11 ml  Output 550 ml  Net 167.11 ml   Filed Weights   03/23/19 1523 03/24/19 0315 03/25/19 0500  Weight: 55.3 kg 53 kg 48 kg    Examination: General:  Pleasantly resting in bed, No acute distress. Alert to person and general situation only. HEENT:  Normocephalic atraumatic.  Sclerae nonicteric, noninjected.  Extraocular movements intact bilaterally. Neck:  Without mass or deformity.  Trachea is midline. Lungs:  Clear to auscultate bilaterally without rhonchi, wheeze, or rales.  Heart:  Regular rate and rhythm.  Blowing systolic murmur 4/6, otherwise without rubs, or gallops. Abdomen:  Soft, nontender, nondistended.  Without guarding or rebound. Extremities: Without cyanosis, clubbing, edema, right wrist bandage clean dry intact; fingers distal to bandage somewhat edematous but nonpitting Vascular:  Dorsalis pedis and posterior tibial pulses palpable bilaterally. Skin:  Warm and dry, no erythema, no ulcerations.  CBC: Recent Labs  Lab 03/20/19 1325 03/21/19 0125 03/22/19 0321 03/23/19 0245 03/24/19 0420 03/25/19 0319  WBC 5.6 6.3 6.9 7.5 7.6 7.0  NEUTROABS 3.7  --   --   --   --   --   HGB 8.0* 8.0* 8.0* 8.4* 7.9* 8.0*  HCT 25.8* 24.1* 24.4* 26.5* 24.6* 24.1*  MCV 92.8 91.3 90.4 92.7 92.1 93.4  PLT 188 174 187 173 186 161   Basic Metabolic Panel: Recent Labs  Lab 03/21/19 0125 03/22/19 0321 03/23/19 0245 03/24/19 0420 03/25/19 0319  NA 137 135 136 138 134*  K 3.8 3.5 3.8 3.7 3.5  CL 104 99 102 104 100  CO2 24 25 23 25 23   GLUCOSE 116* 107* 110* 108* 103*  BUN 35* 32* 27* 26* 28*  CREATININE 1.34* 1.16* 1.00 0.92 0.99  CALCIUM 9.5 9.6 9.4 9.6 9.2  MG  --  2.3 2.3 2.3 2.2   GFR: Estimated Creatinine Clearance: 35.9 mL/min (by C-G formula based on SCr of 0.99 mg/dL). Liver Function Tests: No results for input(s): AST, ALT, ALKPHOS, BILITOT, PROT, ALBUMIN in the last 168 hours. No results for input(s): LIPASE, AMYLASE in the last 168 hours. No results for input(s): AMMONIA in the last 168 hours. Coagulation Profile: Recent Labs  Lab 03/21/19 0125 03/22/19 0321 03/23/19 0245 03/24/19 0420 03/25/19 0319  INR 2.3* 1.4* 1.3* 1.3* 1.4*   Cardiac Enzymes: No results for input(s): CKTOTAL, CKMB, CKMBINDEX, TROPONINI in the last 168 hours. BNP (last 3 results) No results for input(s): PROBNP in the last 8760 hours. HbA1C: No results for input(s): HGBA1C in the last 72 hours. CBG: No results for input(s): GLUCAP in the last 168 hours.  Lipid Profile: No results for input(s): CHOL, HDL, LDLCALC, TRIG, CHOLHDL, LDLDIRECT in the last 72 hours. Thyroid Function Tests: No results for input(s): TSH, T4TOTAL, FREET4, T3FREE, THYROIDAB in the last 72 hours. Anemia Panel: No results for input(s): VITAMINB12, FOLATE, FERRITIN, TIBC, IRON, RETICCTPCT in the last 72 hours. Urine analysis:    Component Value Date/Time   COLORURINE YELLOW 03/20/2019 1404   APPEARANCEUR HAZY (A) 03/20/2019 1404   LABSPEC 1.009 03/20/2019 1404   PHURINE 7.0 03/20/2019 1404   GLUCOSEU NEGATIVE 03/20/2019 1404   HGBUR NEGATIVE 03/20/2019 1404   BILIRUBINUR NEGATIVE 03/20/2019 1404   KETONESUR NEGATIVE 03/20/2019 1404   PROTEINUR NEGATIVE 03/20/2019 1404   UROBILINOGEN 0.2 03/15/2013 1323   NITRITE NEGATIVE 03/20/2019 1404   LEUKOCYTESUR SMALL (A) 03/20/2019 1404    Recent Results (from the past 240 hour(s))  Urine culture     Status: Abnormal   Collection Time: 03/20/19  3:29 PM   Specimen: Urine, Catheterized  Result Value Ref Range Status   Specimen Description URINE, CATHETERIZED  Final   Special Requests   Final    NONE Performed at [facility], [place] [address], [place], [place] [address]    Culture >=100,000 COLONIES/mL ESCHERICHIA COLI (A)  Final   Report Status 03/22/2019 FINAL  Final   Organism ID, Bacteria ESCHERICHIA COLI (A)  Final      Susceptibility   Escherichia coli - MIC*    AMPICILLIN <=2 SENSITIVE Sensitive     CEFAZOLIN <=4 SENSITIVE Sensitive     CEFTRIAXONE <=1 SENSITIVE Sensitive     CIPROFLOXACIN <=0.25 SENSITIVE Sensitive     GENTAMICIN <=1 SENSITIVE Sensitive     IMIPENEM <=0.25 SENSITIVE Sensitive     NITROFURANTOIN <=16 SENSITIVE Sensitive     TRIMETH/SULFA <=20 SENSITIVE Sensitive     AMPICILLIN/SULBACTAM <=2 SENSITIVE Sensitive     PIP/TAZO <=4 SENSITIVE Sensitive     Extended ESBL NEGATIVE Sensitive     * >=100,000 COLONIES/mL ESCHERICHIA COLI  SARS Coronavirus 2 (CEPHEID- Performed in [place] hospital lab), Hosp Order     Status: None   Collection Time: 03/20/19  4:04 PM   Specimen: Nasopharyngeal Swab  Result Value Ref Range Status   SARS Coronavirus 2 NEGATIVE NEGATIVE Final    Comment: (NOTE) If result is NEGATIVE SARS-CoV-2 target nucleic acids are NOT DETECTED. The SARS-CoV-2 RNA is generally detectable in upper and lower  respiratory specimens during the acute phase of infection. The lowest  concentration of SARS-CoV-2 viral copies this assay can detect is 250  copies / mL. A negative result does not preclude SARS-CoV-2 infection  and should not be used as the sole basis for treatment or other  patient management decisions.  A negative result may occur with  improper specimen collection / handling, submission of specimen other  than nasopharyngeal swab, presence of viral mutation(s) within the  areas targeted by this assay, and inadequate number of viral copies  (<250 copies / mL). A negative result must be combined with clinical  observations, patient history, and epidemiological information. If result is POSITIVE SARS-CoV-2 target nucleic acids are DETECTED. The SARS-CoV-2 RNA is generally detectable in upper and lower  respiratory specimens dur ing the acute phase of infection.  Positive  results are indicative of active infection with SARS-CoV-2.  Clinical  correlation with patient history and other diagnostic information is  necessary to determine patient infection status.  Positive results do  not rule out bacterial infection or co-infection with other viruses. If result is PRESUMPTIVE POSTIVE SARS-CoV-2 nucleic acids MAY BE PRESENT.   A presumptive positive result was obtained on the submitted specimen  and confirmed on repeat testing.  While 2019 novel coronavirus  (SARS-CoV-2) nucleic acids may be present in the submitted sample  additional confirmatory testing may be necessary for epidemiological  and / or clinical management purposes  to  differentiate between  SARS-CoV-2 and other Sarbecovirus currently known to infect humans.  If clinically indicated additional testing with an alternate test  methodology [phone]) is advised. The SARS-CoV-2 RNA is generally  detectable in upper and lower respiratory sp ecimens during the acute  phase of infection. The expected result is Negative. Fact Sheet for Patients:  StrictlyIdeas.no Fact Sheet for Healthcare Providers: BankingDealers.co.za This test is not yet approved or cleared by the [place] FDA and has been authorized for detection and/or diagnosis of SARS-CoV-2 by FDA under an Emergency Use Authorization (EUA).  This EUA will remain in effect (meaning this test can be used) for the duration of the COVID-19 declaration under Section 564(b)(1) of the Act, 21 U.S.C. section 360bbb-3(b)(1), unless the authorization is terminated or revoked sooner. Performed at [facility], [place] [address], [place], [place] [address]   MRSA PCR Screening     Status: None   Collection Time: 03/20/19 11:40 PM   Specimen: Nasal Mucosa; Nasopharyngeal  Result Value Ref Range Status   MRSA by PCR NEGATIVE NEGATIVE Final    Comment:        The GeneXpert MRSA Assay (FDA approved for NASAL specimens only), is one component of a comprehensive MRSA colonization surveillance program. It is not intended to diagnose MRSA infection nor to guide or monitor treatment for MRSA infections. Performed at [facility], [place] [address] [place], [place] [address]      Radiology Studies: No results found. Scheduled Meds: . furosemide  80 mg Oral BID  . metoprolol tartrate  25 mg Oral BID  . oxybutynin  2.5 mg Oral QHS  . spironolactone  12.5 mg Oral Daily  . topiramate  25 mg Oral BID  . Warfarin - Pharmacist Dosing Inpatient   Does not apply q1800   LOS: 5 days   Little Ishikawa, MD [facility]  If 7PM-7AM, please contact  night-coverage [email or web address] Password [email or web address] 03/25/2019, 10:11 AM

## 2019-03-25 NOTE — Progress Notes (Signed)
ANTICOAGULATION CONSULT NOTE - Follow-Up Consult  Pharmacy Consult for Heparin Indication: mechanical mitral valve, atrial fibrillation  Allergies  Allergen Reactions  . Codeine Nausea And Vomiting    Patient Measurements: Height: 5\' 1"  (154.9 cm) Weight: 116 lb 13.5 oz (53 kg) IBW/kg (Calculated) : 47.8 Heparin Dosing Weight: 54kg  Vital Signs: Temp: 98 F (36.7 C) (07/04 0355) Temp Source: Oral (07/04 0355) BP: 97/71 (07/04 0355) Pulse Rate: 73 (07/04 0355)  Labs: Recent Labs    03/22/19 2108 03/23/19 0245 03/24/19 0420 03/24/19 1815 03/25/19 0319  HGB  --  8.4* 7.9*  --   --   HCT  --  26.5* 24.6*  --   --   PLT  --  173 186  --   --   LABPROT  --  15.9* 16.3*  --  17.0*  INR  --  1.3* 1.3*  --  1.4*  HEPARINUNFRC <0.10*  --   --  <0.10* 0.10*  CREATININE  --  1.00 0.92  --   --     Estimated Creatinine Clearance: 38.6 mL/min (by C-G formula based on SCr of 0.92 mg/dL).  Assessment: 65 yoF on warfarin at home for hx of AFib and mechanical MVR. Pt admitted s/p fall with  plans for surgery, however pt received inadvertent heparin overdose on 6/29 pm. Pt received protamine and vitamin K for INR of 2.3, with heparin reversed. Heparin infusion resumed but phlebotomy unable to draw heparin levels for monitoring, so heparin converted to VTE prophylaxis dosing in anticipation of surgery 7/1 given inability to monitor safely after overdose.   Pt status post wrist surgery on 7/2. Per MD, ok to resume heparin infusion and warfarin. INR down to 1.3 this morning, H/H stable. No DDI noted.  7/4 AM update: heparin level low this AM, no issues per RN  Goal of Therapy:  Heparin level 0.3-0.5 units/ml Monitor platelets by anticoagulation protocol: Yes   Plan:  Increase heparin infusion to 1000 units/hr Re-check heparin level in 8 hours  Narda Bonds, PharmD, [place] Pharmacist Phone: [phone]

## 2019-03-25 NOTE — Progress Notes (Signed)
ANTICOAGULATION CONSULT NOTE - Follow-Up Consult  Pharmacy Consult for Heparin / warfarin Indication: mechanical mitral valve, atrial fibrillation  Allergies  Allergen Reactions  . Codeine Nausea And Vomiting    Patient Measurements: Height: 5\' 1"  (154.9 cm) Weight: 105 lb 13.1 oz (48 kg) IBW/kg (Calculated) : 47.8 Heparin Dosing Weight: 54kg  Vital Signs: Temp: 97.6 F (36.4 C) (07/04 1454) Temp Source: Oral (07/04 1454) BP: 116/68 (07/04 1043) Pulse Rate: 110 (07/04 1043)  Labs: Recent Labs    03/23/19 0245 03/24/19 0420 03/24/19 1815 03/25/19 0319 03/25/19 1347  HGB 8.4* 7.9*  --  8.0*  --   HCT 26.5* 24.6*  --  24.1*  --   PLT 173 186  --  167  --   LABPROT 15.9* 16.3*  --  17.0*  --   INR 1.3* 1.3*  --  1.4*  --   HEPARINUNFRC  --   --  <0.10* 0.10* 0.32  CREATININE 1.00 0.92  --  0.99  --     Estimated Creatinine Clearance: 35.9 mL/min (by C-G formula based on SCr of 0.99 mg/dL).  Assessment: 60 yoF on warfarin at home for hx of AFib and mechanical MVR. Pt admitted s/p fall with  plans for surgery, however pt received inadvertent heparin overdose on 6/29 pm. Pt received protamine and vitamin K for INR of 2.3, with heparin reversed. Heparin infusion resumed but phlebotomy unable to draw heparin levels for monitoring, so heparin converted to VTE prophylaxis dosing in anticipation of surgery 7/1 given inability to monitor safely after overdose.   Pt status post wrist surgery on 7/2. Per MD 7/3, ok to resume heparin infusion and warfarin. INR 1.4 today H/H stable. No DDI noted.  Heparin drip 1000 uts/hr HL 0.3 at goal   Goal of Therapy:  Heparin level 0.3-0.5 units/ml Monitor platelets by anticoagulation protocol: Yes   Plan:  Continue  heparin infusion to 1000 units/hr Warfarin 5mg  repeat again today  Daily HL, Protime, CBC  Bonnita Nasuti Pharm.D. CPP, BCPS Clinical Pharmacist [phone] 03/25/2019 3:31 PM

## 2019-03-26 LAB — MAGNESIUM: Magnesium: 2.2 mg/dL (ref 1.7–2.4)

## 2019-03-26 LAB — HEPARIN LEVEL (UNFRACTIONATED): Heparin Unfractionated: 0.37 IU/mL (ref 0.30–0.70)

## 2019-03-26 LAB — PROTIME-INR
INR: 1.3 — ABNORMAL HIGH (ref 0.8–1.2)
Prothrombin Time: 16.2 seconds — ABNORMAL HIGH (ref 11.4–15.2)

## 2019-03-26 MED ORDER — WARFARIN SODIUM 7.5 MG PO TABS
7.5000 mg | ORAL_TABLET | Freq: Once | ORAL | Status: AC
  Administered 2019-03-26: 7.5 mg via ORAL
  Filled 2019-03-26: qty 1

## 2019-03-26 MED ORDER — POLYETHYLENE GLYCOL 3350 17 G PO PACK
17.0000 g | PACK | Freq: Every day | ORAL | Status: DC | PRN
  Administered 2019-03-26: 17 g via ORAL

## 2019-03-26 NOTE — Progress Notes (Signed)
CSW followed up with [employer] who reports insurance auth still pending for patient. CSW notified nurse of needing updated COVID test order for patient to go to SNF.   [place], [place]

## 2019-03-26 NOTE — Progress Notes (Signed)
ANTICOAGULATION CONSULT NOTE - Follow-Up Consult  Pharmacy Consult for Heparin / warfarin Indication: mechanical mitral valve, atrial fibrillation  Allergies  Allergen Reactions  . Codeine Nausea And Vomiting    Patient Measurements: Height: 5\' 1"  (154.9 cm) Weight: 105 lb 13.1 oz (48 kg) IBW/kg (Calculated) : 47.8 Heparin Dosing Weight: 54kg  Vital Signs: Temp: 97.5 F (36.4 C) (07/05 0729) Temp Source: Axillary (07/05 0729) BP: 102/65 (07/05 0904) Pulse Rate: 76 (07/05 0904)  Labs: Recent Labs    03/24/19 0420  03/25/19 0319 03/25/19 1347 03/26/19 0234  HGB 7.9*  --  8.0*  --   --   HCT 24.6*  --  24.1*  --   --   PLT 186  --  167  --   --   LABPROT 16.3*  --  17.0*  --  16.2*  INR 1.3*  --  1.4*  --  1.3*  HEPARINUNFRC  --    < > 0.10* 0.32 0.37  CREATININE 0.92  --  0.99  --   --    < > = values in this interval not displayed.    Estimated Creatinine Clearance: 35.9 mL/min (by C-G formula based on SCr of 0.99 mg/dL).  Assessment: 31 yoF on warfarin at home for hx of AFib and mechanical MVR. Pt admitted s/p fall with  plans for surgery, however pt received inadvertent heparin overdose on 6/29 pm. Pt received protamine to reverse heparin and vitamin K for INR of 2.3.   Pt status post wrist surgery on 7/2. Per MD 7/3, ok to resume heparin infusion and warfarin. INR 1.3 today H/H stable. No DDI noted.  Heparin drip 1000 uts/hr HL 0.37 at goal   PTA warfarin dose 2.5mg  Tu/Sat, 5mg  AOD   Goal of Therapy:  Heparin level 0.3-0.5 units/ml Monitor platelets by anticoagulation protocol: Yes   Plan:  Continue  heparin infusion to 1000 units/hr Warfarin 7.5mg  repeat again today  Daily HL, Protime, CBC  Bonnita Nasuti Pharm.D. CPP, BCPS Clinical Pharmacist [phone] 03/26/2019 2:10 PM

## 2019-03-26 NOTE — Progress Notes (Signed)
PROGRESS NOTE    Carrie [name]  XKG:[id number] DOB: [birth year] DOA: 03/20/2019 PCP: Lucianne Lei, MD   Brief Narrative:  78 y.o. 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 She denies that she had chest pain, palpitation, dizziness.  She denies that she had loss of consciousness.  She states she "just fell".  She does not think she hit her head.  She did have wrist pain and understands that she needs to have surgery in the morning.  At present she has no acute complaints other than that she is hungry and thirsty.  She feels her pain is well controlled. She denies any recent illnesses.  Denies any recent fevers or chills.  Denies cough.  Denies nausea vomiting or diarrhea.  Denies abdominal pain.  Denies dysuria. The patient was noted to have a right wrist fracture. She was seen by orthopedics who request [facility] hospitalist admit given multiple medical.  Subjective: Awake alert well no distress No pain No fever No dark tarry stool, chills, managing with splint on right hand  Assessment & Plan:   Principal Problem:   Right wrist fracture Active Problems:   Acute lower UTI   Atrial fibrillation (HCC)   S/P mitral valve replacement, [employer]   Chronic anticoagulation, ( INR goal 2.0-2.5 due to history of subdural hematoma and liver hemorrhage)   Essential hypertension   SSS (sick sinus syndrome) (HCC)   Longstanding persistent atrial fibrillation   Pacemaker   NSTEMI (non-ST elevated myocardial infarction) ([place])   PAH (pulmonary artery hypertension) ([place])   Hypertensive heart disease with heart failure (HCC)   Chronic diastolic heart failure (HCC)   CKD (chronic kidney disease), stage III (HCC)   Stroke due to embolism of right middle cerebral  artery (HCC)   Acute right MCA stroke (HCC)   Dementia (HCC)   CAD (coronary artery disease)  Right wrist fracture status post mechanical fall POA S/p ORIF w/ acumed volar plate 03/23/19, improving Patient evaluated by orthopedic surgery Dr. Dayna Barker appreciate insight and recommendation Tolerated procedure well -pain currently well controlled on current regimen Bridge heparin-->Coumadin per pharmacy orthopedics agreed Will need skilled facility-difficulty with navigation with broken wrist  Acute UTI, POA, Resolved Urine culture remarkable for E. coli, pan sensitive Ceftriaxone x3 doses - last dose 03/24/19  Mechanical fall Syncope ruled out per discussion with husband Probably a mechanical slip and fall in the bathroom at home -attempting to ambulate without walker which is her baseline  Echo is grossly unchanged from prior per report - severe TR, EF 70-26%  Chronic Diastolic CHF/sick sinus syndrome, not in acute exacerbation Currently -1.149 cc weight down 53-48 when last checked Continue home Lasix 80 mg BID, Metoprolol 25 mg BID, Spironolactone 12.5 mg daily  Atrial fibrillation, rate controlled, prior PPM Home warfarin resumed postoperatively 03/24/2019 Monitor INR - 1.5 > 1.8 > 2.3 > 1.4-->1.3 Vitamin K - 3 mg previously given  Heparin overdose, resolved Heparin gtt was bolused previously with elevated APTT to >200; PTT 25 on 6/30 Pharmacy managing  CAD Stable, see CHF  CKD stage III baselineCr~1.32) Monitor closely Creatinine currently WNL  Dementia Secondary to multiple strokes and previous subdural hematoma. Per husband baseline appears to be alert to person and able to answer general simple questions about current situation (pain, hungry, etc).  DVT prophylaxis: Heparin Code Status: Full Family Communication: Called husband on cell phone in addition to house phone----updated Disposition Plan: Remains inpatient, patient now postop with fracture wrist repair as  above.  Patient is on heparin drip to bridge with Coumadin.  Currently PT commending SNF placement, husband agreeable--Covid to be drawn  Consultants  Dr. Dayna Barker Orth[facility]   Anti-infectives (From admission, onward)   Start     Stop   03/21/19 0600  ceFAZolin (ANCEF) IVPB 2g/100 mL premix  Status:  Discontinued     03/20/19 2338     Objective: Vitals:   03/26/19 0300 03/26/19 0318 03/26/19 0729 03/26/19 0904  BP:  (!) 91/50 107/61 102/65  Pulse: 61 (!) 108 97 76  Resp: 18 (!) 21 (!) 24   Temp:  98.3 F (36.8 C) (!) 97.5 F (36.4 C)   TempSrc:   Axillary   SpO2: 95% 97% 97% 93%  Weight:      Height:        Intake/Output Summary (Last 24 hours) at 03/26/2019 1510 Last data filed at 03/26/2019 1200 Gross per 24 hour  Intake 696.36 ml  Output 600 ml  Net 96.36 ml   Filed Weights   03/23/19 1523 03/24/19 0315 03/25/19 0500  Weight: 55.3 kg 53 kg 48 kg    Examination:  Awake alert arcus senilis Neck soft + JVD cta b no added sound s1 s2 sinus tach with PVC--no VT abd soft nt nd no rebound LE edema is neg  CBC: Recent Labs  Lab 03/20/19 1325 03/21/19 0125 03/22/19 0321 03/23/19 0245 03/24/19 0420 03/25/19 0319  WBC 5.6 6.3 6.9 7.5 7.6 7.0  NEUTROABS 3.7  --   --   --   --   --   HGB 8.0* 8.0* 8.0* 8.4* 7.9* 8.0*  HCT 25.8* 24.1* 24.4* 26.5* 24.6* 24.1*  MCV 92.8 91.3 90.4 92.7 92.1 93.4  PLT 188 174 187 173 186 124   Basic Metabolic Panel: Recent Labs  Lab 03/21/19 0125 03/22/19 0321 03/23/19 0245 03/24/19 0420 03/25/19 0319 03/26/19 0234  NA 137 135 136 138 134*  --   K 3.8 3.5 3.8 3.7 3.5  --   CL 104 99 102 104 100  --   CO2 24 25 23 25 23   --   GLUCOSE 116* 107* 110* 108* 103*  --   BUN 35* 32* 27* 26* 28*  --   CREATININE 1.34* 1.16* 1.00 0.92 0.99  --   CALCIUM 9.5 9.6 9.4 9.6 9.2  --   MG  --  2.3 2.3 2.3 2.2 2.2   GFR: Estimated Creatinine Clearance: 35.9 mL/min (by C-G formula based on SCr of 0.99 mg/dL). Liver Function Tests: No  results for input(s): AST, ALT, ALKPHOS, BILITOT, PROT, ALBUMIN in the last 168 hours. No results for input(s): LIPASE, AMYLASE in the last 168 hours. No results for input(s): AMMONIA in the last 168 hours. Coagulation Profile: Recent Labs  Lab 03/22/19 0321 03/23/19 0245 03/24/19 0420 03/25/19 0319 03/26/19 0234  INR 1.4* 1.3* 1.3* 1.4* 1.3*   Cardiac Enzymes: No results for input(s): CKTOTAL, CKMB, CKMBINDEX, TROPONINI in the last 168 hours. BNP (last 3 results) No results for input(s): PROBNP in the last 8760 hours. HbA1C: No results for input(s): HGBA1C in the last 72 hours. CBG: No results for input(s): GLUCAP in the last 168 hours. Lipid Profile: No results for input(s): CHOL, HDL, LDLCALC, TRIG, CHOLHDL, LDLDIRECT in the last 72 hours. Thyroid Function Tests: No results  for input(s): TSH, T4TOTAL, FREET4, T3FREE, THYROIDAB in the last 72 hours. Anemia Panel: No results for input(s): VITAMINB12, FOLATE, FERRITIN, TIBC, IRON, RETICCTPCT in the last 72 hours. Urine analysis:    Component Value Date/Time   COLORURINE YELLOW 03/20/2019 1404   APPEARANCEUR HAZY (A) 03/20/2019 1404   LABSPEC 1.009 03/20/2019 1404   PHURINE 7.0 03/20/2019 1404   GLUCOSEU NEGATIVE 03/20/2019 1404   HGBUR NEGATIVE 03/20/2019 1404   BILIRUBINUR NEGATIVE 03/20/2019 1404   KETONESUR NEGATIVE 03/20/2019 1404   PROTEINUR NEGATIVE 03/20/2019 1404   UROBILINOGEN 0.2 03/15/2013 1323   NITRITE NEGATIVE 03/20/2019 1404   LEUKOCYTESUR SMALL (A) 03/20/2019 1404    Recent Results (from the past 240 hour(s))  Urine culture     Status: Abnormal   Collection Time: 03/20/19  3:29 PM   Specimen: Urine, Catheterized  Result Value Ref Range Status   Specimen Description URINE, CATHETERIZED  Final   Special Requests   Final    NONE Performed at [facility], [place] [address], [place], [place] [address]    Culture >=100,000 COLONIES/mL ESCHERICHIA COLI (A)  Final   Report Status 03/22/2019 FINAL   Final   Organism ID, Bacteria ESCHERICHIA COLI (A)  Final      Susceptibility   Escherichia coli - MIC*    AMPICILLIN <=2 SENSITIVE Sensitive     CEFAZOLIN <=4 SENSITIVE Sensitive     CEFTRIAXONE <=1 SENSITIVE Sensitive     CIPROFLOXACIN <=0.25 SENSITIVE Sensitive     GENTAMICIN <=1 SENSITIVE Sensitive     IMIPENEM <=0.25 SENSITIVE Sensitive     NITROFURANTOIN <=16 SENSITIVE Sensitive     TRIMETH/SULFA <=20 SENSITIVE Sensitive     AMPICILLIN/SULBACTAM <=2 SENSITIVE Sensitive     PIP/TAZO <=4 SENSITIVE Sensitive     Extended ESBL NEGATIVE Sensitive     * >=100,000 COLONIES/mL ESCHERICHIA COLI  SARS Coronavirus 2 (CEPHEID- Performed in [place] hospital lab), Hosp Order     Status: None   Collection Time: 03/20/19  4:04 PM   Specimen: Nasopharyngeal Swab  Result Value Ref Range Status   SARS Coronavirus 2 NEGATIVE NEGATIVE Final    Comment: (NOTE) If result is NEGATIVE SARS-CoV-2 target nucleic acids are NOT DETECTED. The SARS-CoV-2 RNA is generally detectable in upper and lower  respiratory specimens during the acute phase of infection. The lowest  concentration of SARS-CoV-2 viral copies this assay can detect is 250  copies / mL. A negative result does not preclude SARS-CoV-2 infection  and should not be used as the sole basis for treatment or other  patient management decisions.  A negative result may occur with  improper specimen collection / handling, submission of specimen other  than nasopharyngeal swab, presence of viral mutation(s) within the  areas targeted by this assay, and inadequate number of viral copies  (<250 copies / mL). A negative result must be combined with clinical  observations, patient history, and epidemiological information. If result is POSITIVE SARS-CoV-2 target nucleic acids are DETECTED. The SARS-CoV-2 RNA is generally detectable in upper and lower  respiratory specimens dur ing the acute phase of infection.  Positive  results are indicative of  active infection with SARS-CoV-2.  Clinical  correlation with patient history and other diagnostic information is  necessary to determine patient infection status.  Positive results do  not rule out bacterial infection or co-infection with other viruses. If result is PRESUMPTIVE POSTIVE SARS-CoV-2 nucleic acids MAY BE PRESENT.   A presumptive positive result was obtained on the  submitted specimen  and confirmed on repeat testing.  While 2019 novel coronavirus  (SARS-CoV-2) nucleic acids may be present in the submitted sample  additional confirmatory testing may be necessary for epidemiological  and / or clinical management purposes  to differentiate between  SARS-CoV-2 and other Sarbecovirus currently known to infect humans.  If clinically indicated additional testing with an alternate test  methodology [phone]) is advised. The SARS-CoV-2 RNA is generally  detectable in upper and lower respiratory sp ecimens during the acute  phase of infection. The expected result is Negative. Fact Sheet for Patients:  StrictlyIdeas.no Fact Sheet for Healthcare Providers: BankingDealers.co.za This test is not yet approved or cleared by the [place] FDA and has been authorized for detection and/or diagnosis of SARS-CoV-2 by FDA under an Emergency Use Authorization (EUA).  This EUA will remain in effect (meaning this test can be used) for the duration of the COVID-19 declaration under Section 564(b)(1) of the Act, 21 U.S.C. section 360bbb-3(b)(1), unless the authorization is terminated or revoked sooner. Performed at [facility], [place] [address] [place], [place] [address]   MRSA PCR Screening     Status: None   Collection Time: 03/20/19 11:40 PM   Specimen: Nasal Mucosa; Nasopharyngeal  Result Value Ref Range Status   MRSA by PCR NEGATIVE NEGATIVE Final    Comment:        The GeneXpert MRSA Assay (FDA approved for NASAL specimens only),  is one component of a comprehensive MRSA colonization surveillance program. It is not intended to diagnose MRSA infection nor to guide or monitor treatment for MRSA infections. Performed at [facility], [place] [address], [place], [place] [address]      Radiology Studies: No results found. Scheduled Meds: . furosemide  80 mg Oral BID  . metoprolol tartrate  25 mg Oral BID  . oxybutynin  2.5 mg Oral QHS  . spironolactone  12.5 mg Oral Daily  . topiramate  25 mg Oral BID  . warfarin  7.5 mg Oral ONCE-1800  . Warfarin - Pharmacist Dosing Inpatient   Does not apply q1800   LOS: 6 days   Nita Sells, MD [facility]  If 7PM-7AM, please contact night-coverage [email or web address] Password [email or web address] 03/26/2019, 3:10 PM

## 2019-03-27 ENCOUNTER — Other Ambulatory Visit: Payer: Self-pay

## 2019-03-27 ENCOUNTER — Encounter (HOSPITAL_COMMUNITY): Payer: Self-pay | Admitting: General Surgery

## 2019-03-27 LAB — COMPREHENSIVE METABOLIC PANEL
ALT: 10 U/L (ref 0–44)
AST: 88 U/L — ABNORMAL HIGH (ref 15–41)
Albumin: 2.6 g/dL — ABNORMAL LOW (ref 3.5–5.0)
Alkaline Phosphatase: 89 U/L (ref 38–126)
Anion gap: 8 (ref 5–15)
BUN: 29 mg/dL — ABNORMAL HIGH (ref 8–23)
CO2: 24 mmol/L (ref 22–32)
Calcium: 8.9 mg/dL (ref 8.9–10.3)
Chloride: 102 mmol/L (ref 98–111)
Creatinine, Ser: 1 mg/dL (ref 0.44–1.00)
GFR calc Af Amer: >60 mL/min (ref >60)
GFR calc non Af Amer: 54 mL/min — ABNORMAL LOW (ref >60)
Glucose, Bld: 96 mg/dL (ref 70–99)
Potassium: 3.5 mmol/L (ref 3.5–5.1)
Sodium: 134 mmol/L — ABNORMAL LOW (ref 135–145)
Total Bilirubin: 1.1 mg/dL (ref 0.3–1.2)
Total Protein: 6.4 g/dL — ABNORMAL LOW (ref 6.5–8.1)

## 2019-03-27 LAB — PROTIME-INR
INR: 1.3 — ABNORMAL HIGH (ref 0.8–1.2)
Prothrombin Time: 16.1 seconds — ABNORMAL HIGH (ref 11.4–15.2)

## 2019-03-27 LAB — HEPARIN LEVEL (UNFRACTIONATED): Heparin Unfractionated: 0.34 IU/mL (ref 0.30–0.70)

## 2019-03-27 LAB — SARS CORONAVIRUS 2 BY RT PCR (HOSPITAL ORDER, PERFORMED IN ~~LOC~~ HOSPITAL LAB): SARS Coronavirus 2: NEGATIVE

## 2019-03-27 MED ORDER — POLYETHYLENE GLYCOL 3350 17 G PO PACK: 17.0000 g | PACK | Freq: Every day | ORAL | 0 refills | Status: AC | PRN

## 2019-03-27 MED ORDER — ENOXAPARIN SODIUM 60 MG/0.6ML ~~LOC~~ SOLN: 60.0000 mg | Freq: Two times a day (BID) | SUBCUTANEOUS | 0 refills | Status: AC

## 2019-03-27 MED ORDER — WARFARIN SODIUM 7.5 MG PO TABS
7.5000 mg | ORAL_TABLET | Freq: Once | ORAL | Status: AC
  Administered 2019-03-27: 7.5 mg via ORAL
  Filled 2019-03-27: qty 1

## 2019-03-27 NOTE — Progress Notes (Signed)
ANTICOAGULATION CONSULT NOTE - Follow-Up Consult  Pharmacy Consult for Heparin / warfarin Indication: mechanical mitral valve, atrial fibrillation  Allergies  Allergen Reactions  . Codeine Nausea And Vomiting    Patient Measurements: Height: 5\' 1"  (154.9 cm) Weight: 116 lb 13.5 oz (53 kg)(wt could be off due to bed position and extra items on bed ) IBW/kg (Calculated) : 47.8 Heparin Dosing Weight: 54kg  Vital Signs: Temp: 97.6 F (36.4 C) (07/06 0707) Temp Source: Oral (07/06 0707) BP: 131/61 (07/06 0707) Pulse Rate: 60 (07/06 0707)  Labs: Recent Labs    03/25/19 0319 03/25/19 1347 03/26/19 0234 03/27/19 0326  HGB 8.0*  --   --   --   HCT 24.1*  --   --   --   PLT 167  --   --   --   LABPROT 17.0*  --  16.2* 16.1*  INR 1.4*  --  1.3* 1.3*  HEPARINUNFRC 0.10* 0.32 0.37 0.34  CREATININE 0.99  --   --  1.00    Estimated Creatinine Clearance: 35.6 mL/min (by C-G formula based on SCr of 1 mg/dL).  Assessment: 22 yoF on warfarin at home for hx of AFib and mechanical MVR. Pt admitted s/p fall with  plans for surgery, however pt received inadvertent heparin overdose on 6/29 pm. Pt received protamine to reverse heparin and vitamin K for INR of 2.3.   Pt status post wrist surgery on 7/2. Per MD 7/3, ok to resume heparin infusion and warfarin. INR 1.3 today despite boost doses of warfarin  H/H stable. No DDI noted.  Heparin drip 1000 uts/hr HL 0.34 at goal   PTA warfarin dose 2.5mg  Tu/Sat, 5mg  AOD   Goal of Therapy:  Heparin level 0.3-0.5 units/ml Monitor platelets by anticoagulation protocol: Yes   Plan:  Continue  heparin infusion to 1000 units/hr Warfarin 7.5mg  repeat again today  Daily HL, Protime, CBC  Bonnita Nasuti Pharm.D. CPP, BCPS Clinical Pharmacist [phone] 03/27/2019 9:08 AM

## 2019-03-27 NOTE — Care Management Important Message (Signed)
Important Message  Patient Details  Name: Carrie [name] MRN: [id number] Date of Birth: Feb 18, 1941   Medicare Important Message Given:  Yes     Memory Argue 03/27/2019, 2:00 PM

## 2019-03-27 NOTE — Care Management (Signed)
CM attempted to follow up with Carrie [name], [facility], to discuss the status of the insurance auth. VM left requesting a returned call. CM updated Dr. Markus Daft RN, BSN, NCM-BC, ACM-RN [phone]

## 2019-03-27 NOTE — Telephone Encounter (Signed)
Refill

## 2019-03-27 NOTE — Care Management (Signed)
CM spoke to Harriet Pho, [employer], and was informed Carrie [name] was received for SNF placement. Patient will be in: Room 101-b; nurse report to be called to: [phone].   Addendum: 03/27/19 @ 1530-Ahniya Mitchum RNCM-message from Harriet Pho that she was informed that insurance Carrie [name] is actually still pending. [employer] site is down at this time. CM updated Dr. Verlon Au.  Midge Minium RN, BSN, NCM-BC, ACM-RN [phone]

## 2019-03-27 NOTE — Discharge Summary (Signed)
Physician Discharge Summary  Carrie [name] FGH:[id number] DOB: May 31, 1941 DOA: 03/20/2019  PCP: Lucianne Lei, MD  Admit date: 03/20/2019 Discharge date: 03/27/2019  Time spent: 45 minutes  Recommendations for Outpatient Follow-up:  1. Patient will need bridging from Lovenox to Coumadin-please check INR daily until goal of 2.0 is obtained 2. Patient needs outpatient follow-up withOrthopedics to be arranged from skilled facility for hand with Dr. Lenon Curt 3. Needs Chem-12, CBC within 4 to 5 days  Discharge Diagnoses:  Principal Problem:   Right wrist fracture Active Problems:   Acute lower UTI   Atrial fibrillation (HCC)   S/P mitral valve replacement, [employer]   Chronic anticoagulation, ( INR goal 2.0-2.5 due to history of subdural hematoma and liver hemorrhage)   Essential hypertension   SSS (sick sinus syndrome) (HCC)   Longstanding persistent atrial fibrillation   Pacemaker   NSTEMI (non-ST elevated myocardial infarction) ([place])   PAH (pulmonary artery hypertension) ([place])   Hypertensive heart disease with heart failure (HCC)   Chronic diastolic heart failure (HCC)   CKD (chronic kidney disease), stage III (HCC)   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 ([place])   Acute right MCA stroke (HCC)   Dementia (HCC)   CAD (coronary artery disease)   Discharge Condition: Improved   Diet recommendation: Vitamin K controlled heart healthy low-salt diet  Filed Weights   03/24/19 0315 03/25/19 0500 03/27/19 0339  Weight: 53 kg 48 kg 53 kg    History of present illness:  78 y.o.BF PMHx Anxiety, NSTEMI, atrial fibrillation on coumadin, CAD, chronic diastolic CHF, pulmonary HTN s/p mechanical mitral heart valve, sick sinus syndrome s/p pacemaker, HLD, 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 She denies that she had loss of consciousness. She  states she "just fell". She does not think she hit her head. She did have wrist pain and understands that she needs to have surgery in the morning. At present she has no acute complaints other than that she is hungry and thirsty. She feels her pain is well controlled. She denies any recent illnesses. Denies any recent fevers or chills. Denies cough. Denies nausea vomiting or diarrhea. Denies abdominal pain. Denies dysuria.The patient was noted to have a right wrist fracture. She was seen by orthopedics who request [facility] admit given multiple medical.  Hospital Course:  Right wrist fracture status post mechanical fall POA S/p ORIF w/ acumed volar plate 03/23/19, improving Patient evaluated by orthopedic surgery Dr. Dayna Barker appreciate insight and recommendation Tolerated procedure well -pain currently well controlled on current regimen Bridge heparin-->Coumadin per pharmacy orthopedics agreed Will need skilled facility-difficulty with navigation with broken wrist  Acute UTI, POA, Resolved Urine culture remarkable for E. coli, pan sensitive Ceftriaxone x3 doses - last dose 03/24/19  Mechanical fall Syncope ruled out per discussion with husband Probably a mechanical slip and fall in the bathroom at home -attempting to ambulate without walker which is her baseline  Echo is grossly unchanged from prior per report - severe TR, EF 67-89%  Chronic Diastolic CHF/sick sinus syndrome, not in acute exacerbation Currently -1.149 cc weight down 53-48 when last checked Continue home Lasix 80 mg BID, Metoprolol 25 mg BID, Spironolactone 12.5 mg daily  Atrial fibrillation, rate controlled, prior PPM Mechanical valve Home warfarin resumed postoperatively 03/24/2019 Monitor INR - 1.5 > 1.8 > 2.3 > 1.4-->1.3 Vitamin K - 3 mg previously given Patient will  need to be at a goal of 2-2.5 given prior liver hemorrhage and subdural hemorrhage-INR should be checked daily at facility when  discharged because of the risk of bleeding-Lovenox 1 mg per cake twice daily was used for bridging on discharge and patient should have periodic CBC + LFT in addition to labs  Heparin overdose, resolved Heparin gtt was bolused previously with elevated APTT to >200; PTT 25 on 6/30 Pharmacy managing  CAD Stable, see CHF  CKD stage III baselineCr~1.32) Monitor closely Creatinine currently WNL  Dementia Secondary to multiple strokes and previous subdural hematoma. Per husband baseline appears to be alert to person and able to answer general simple questions about current situation (pain, hungry, etc).  Procedures: Hand surgery on 7/2 by Dr. Lenon Curt Consultations:  Hand surgeon  Discharge Exam: Vitals:   03/27/19 0800 03/27/19 1042  BP: 106/60 110/70  Pulse: (!) 25 75  Resp: (!) 24 (!) 22  Temp:  98 F (36.7 C)  SpO2: 100% 100%    General: Awake alert more coherent interactive-needs help with eating Cardiovascular: S1-S2 blowing holosystolic murmur heard across precordium Respiratory: Clinically clear no added sound Abdomen soft no rebound no guarding Neurologically intact lifts hands and legs above bed-follows and tracks finger well no focal neurological deficit  Discharge Instructions   Discharge Instructions    Diet - low sodium heart healthy   Complete by: As directed    Increase activity slowly   Complete by: As directed      Allergies as of 03/27/2019      Reactions   Codeine Nausea And Vomiting      Medication List    STOP taking these medications   docusate 50 MG/5ML liquid Commonly known as: COLACE     TAKE these medications   acetaminophen 325 MG tablet Commonly known as: TYLENOL Take 1-2 tablets (325-650 mg total) by mouth every 4 (four) hours as needed for mild pain.   albuterol (2.5 MG/3ML) 0.083% nebulizer solution Commonly known as: PROVENTIL Take 3 mLs (2.5 mg total) by nebulization every 6 (six) hours as needed for wheezing or shortness  of breath.   aspirin 81 MG EC tablet Take 1 tablet (81 mg total) by mouth daily.   enoxaparin 60 MG/0.6ML injection Commonly known as: LOVENOX Inject 0.6 mLs (60 mg total) into the skin 2 (two) times daily for 4 days.   furosemide 80 MG tablet Commonly known as: LASIX TAKE 1 TABLET BY MOUTH TWICE DAILY What changed: when to take this   guaiFENesin 100 MG/5ML Soln Commonly known as: ROBITUSSIN Take 5 mLs (100 mg total) by mouth every 6 (six) hours. What changed:   when to take this  reasons to take this   metoprolol tartrate 25 MG tablet Commonly known as: LOPRESSOR TAKE 1 TABLET BY MOUTH TWICE DAILY   MULTIVITAMINS PO Take 1 tablet by mouth daily.   nitroGLYCERIN 0.4 MG SL tablet Commonly known as: NITROSTAT Place 1 tablet (0.4 mg total) under the tongue every 5 (five) minutes as needed for chest pain.   oxybutynin 5 MG tablet Commonly known as: DITROPAN Take 0.5 tablets (2.5 mg total) by mouth at bedtime.   pantoprazole 40 MG tablet Commonly known as: PROTONIX Take 1 tablet (40 mg total) by mouth daily at 12 noon.   polyethylene glycol 17 g packet Commonly known as: MIRALAX / GLYCOLAX Take 17 g by mouth daily as needed for mild constipation or moderate constipation.   polyvinyl alcohol 1.4 % ophthalmic solution Commonly known  as: LIQUIFILM TEARS Place 1 drop into the left eye as needed for dry eyes.   spironolactone 25 MG tablet Commonly known as: ALDACTONE Take 0.5 tablets (12.5 mg total) by mouth daily.   topiramate 25 MG tablet Commonly known as: TOPAMAX TAKE 1 TABLET BY MOUTH TWICE DAILY   warfarin 2.5 MG tablet Commonly known as: COUMADIN Take as directed. If you are unsure how to take this medication, talk to your nurse or doctor. Original instructions: TAKE 1 TABLET BY MOUTH EVERY EVENING AT 6PM What changed: See the new instructions.      Allergies  Allergen Reactions  . Codeine Nausea And Vomiting   Contact information for  after-discharge care    Destination    [facility] Preferred SNF .   Service: Skilled Nursing Contact information: [address] [place] [phone]              - The results of significant diagnostics from this hospitalization (including imaging, microbiology, ancillary and laboratory) are listed below for reference.    Significant Diagnostic Studies: Dg Chest 1 View  Result Date: 03/20/2019 CLINICAL DATA:  Recent fall EXAM: CHEST  1 VIEW COMPARISON:  02/23/2019 FINDINGS: Cardiac shadow remains enlarged. Postsurgical changes as well as a pacing device are again seen. Mild central vascular congestion is noted stable from the prior study. Stable small right pleural effusion is noted. No focal infiltrate is seen. IMPRESSION: Stable vascular congestion and small right pleural effusion. Electronically Signed   By: Inez Catalina M.D.   On: 03/20/2019 13:12   Dg Wrist 2 Views Right  Result Date: 03/20/2019 CLINICAL DATA:  Wrist fracture post casting EXAM: RIGHT WRIST - 2 VIEW COMPARISON:  03/20/2019 FINDINGS: Interval placement of cast. Improved alignment post reduction. Decreased dorsal displacement of comminuted fracture distal radius. Avulsion of the ulnar styloid. IMPRESSION: Significantly improved alignment of dorsally displaced fracture distal radius. Fracture of the ulnar styloid also noted. Electronically Signed   By: Franchot Gallo M.D.   On: 03/20/2019 18:15   Dg Wrist Complete Right  Result Date: 03/20/2019 CLINICAL DATA:  Recent fall with wrist pain, initial encounter EXAM: RIGHT WRIST - COMPLETE 3+ VIEW COMPARISON:  None. FINDINGS: Comminuted distal radial fracture is noted with impaction and posterior angulation at the fracture site. There is approximately 3/4 width bone displacement posteriorly. Avulsion fracture of the ulnar styloid is noted as well. There appears to be intra-articular involvement of the radial fracture. Soft tissue  swelling is noted. IMPRESSION: Distal radial and ulnar fractures as described. Intra-articular involvement of the radial fracture is seen as well as posterior displacement and angulation. Electronically Signed   By: Inez Catalina M.D.   On: 03/20/2019 13:11   Ct Head Wo Contrast  Result Date: 03/20/2019 CLINICAL DATA:  Status post fall, striking her head EXAM: CT HEAD WITHOUT CONTRAST CT CERVICAL SPINE WITHOUT CONTRAST TECHNIQUE: Multidetector CT imaging of the head and cervical spine was performed following the standard protocol without intravenous contrast. Multiplanar CT image reconstructions of the cervical spine were also generated. COMPARISON:  02/24/2019, 02/22/2019 FINDINGS: CT HEAD FINDINGS Brain: No evidence of acute infarction, acute hemorrhage, extra-axial collection, ventriculomegaly, or mass effect. 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 Generalized cerebral atrophy. Periventricular white matter low attenuation likely secondary to microangiopathy. Vascular: Cerebrovascular atherosclerotic calcifications are noted. Skull: Negative for fracture or focal lesion. Sinuses/Orbits: Visualized portions of the orbits are unremarkable. Mastoid sinuses are clear. Right maxillary sinus mucosal thickening. Mild right ethmoid  sinus mucosal thickening. Other: None. CT CERVICAL SPINE FINDINGS Alignment: Normal. Skull base and vertebrae: No acute fracture. No primary bone lesion or focal pathologic process. Soft tissues and spinal canal: No prevertebral fluid or swelling. No visible canal hematoma. Disc levels: Degenerative disc disease with disc height loss C5-6. Bilateral facet arthropathy at C6-7 on the right. No foraminal stenosis. Upper chest: Lung apices are clear. Other: Thoracic aortic atherosclerosis. IMPRESSION: 1. No acute intracranial pathology. Subacute-chronic right posterior fossa subdural hematoma. 2. No acute osseous injury of the cervical spine. 3.  Aortic  Atherosclerosis (ICD10-I70.0). Electronically Signed   By: Kathreen Devoid   On: 03/20/2019 13:00   Ct Cervical Spine Wo Contrast  Result Date: 03/20/2019 CLINICAL DATA:  Status post fall, striking her head EXAM: CT HEAD WITHOUT CONTRAST CT CERVICAL SPINE WITHOUT CONTRAST TECHNIQUE: Multidetector CT imaging of the head and cervical spine was performed following the standard protocol without intravenous contrast. Multiplanar CT image reconstructions of the cervical spine were also generated. COMPARISON:  02/24/2019, 02/22/2019 FINDINGS: CT HEAD FINDINGS Brain: No evidence of acute infarction, acute hemorrhage, extra-axial collection, ventriculomegaly, or mass effect. 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 Generalized cerebral atrophy. Periventricular white matter low attenuation likely secondary to microangiopathy. Vascular: Cerebrovascular atherosclerotic calcifications are noted. Skull: Negative for fracture or focal lesion. Sinuses/Orbits: Visualized portions of the orbits are unremarkable. Mastoid sinuses are clear. Right maxillary sinus mucosal thickening. Mild right ethmoid sinus mucosal thickening. Other: None. CT CERVICAL SPINE FINDINGS Alignment: Normal. Skull base and vertebrae: No acute fracture. No primary bone lesion or focal pathologic process. Soft tissues and spinal canal: No prevertebral fluid or swelling. No visible canal hematoma. Disc levels: Degenerative disc disease with disc height loss C5-6. Bilateral facet arthropathy at C6-7 on the right. No foraminal stenosis. Upper chest: Lung apices are clear. Other: Thoracic aortic atherosclerosis. IMPRESSION: 1. No acute intracranial pathology. Subacute-chronic right posterior fossa subdural hematoma. 2. No acute osseous injury of the cervical spine. 3.  Aortic Atherosclerosis (ICD10-I70.0). Electronically Signed   By: Kathreen Devoid   On: 03/20/2019 13:00    Microbiology: Recent Results (from the past 240 hour(s))   Urine culture     Status: Abnormal   Collection Time: 03/20/19  3:29 PM   Specimen: Urine, Catheterized  Result Value Ref Range Status   Specimen Description URINE, CATHETERIZED  Final   Special Requests   Final    NONE Performed at [facility], [address] [place], [place] [address]    Culture >=100,000 COLONIES/mL ESCHERICHIA COLI (A)  Final   Report Status 03/22/2019 FINAL  Final   Organism ID, Bacteria ESCHERICHIA COLI (A)  Final      Susceptibility   Escherichia coli - MIC*    AMPICILLIN <=2 SENSITIVE Sensitive     CEFAZOLIN <=4 SENSITIVE Sensitive     CEFTRIAXONE <=1 SENSITIVE Sensitive     CIPROFLOXACIN <=0.25 SENSITIVE Sensitive     GENTAMICIN <=1 SENSITIVE Sensitive     IMIPENEM <=0.25 SENSITIVE Sensitive     NITROFURANTOIN <=16 SENSITIVE Sensitive     TRIMETH/SULFA <=20 SENSITIVE Sensitive     AMPICILLIN/SULBACTAM <=2 SENSITIVE Sensitive     PIP/TAZO <=4 SENSITIVE Sensitive     Extended ESBL NEGATIVE Sensitive     * >=100,000 COLONIES/mL ESCHERICHIA COLI  SARS Coronavirus 2 (CEPHEID- Performed in [place] hospital lab), Hosp Order     Status: None   Collection Time: 03/20/19  4:04 PM   Specimen: Nasopharyngeal Swab  Result Value  Ref Range Status   SARS Coronavirus 2 NEGATIVE NEGATIVE Final    Comment: (NOTE) If result is NEGATIVE SARS-CoV-2 target nucleic acids are NOT DETECTED. The SARS-CoV-2 RNA is generally detectable in upper and lower  respiratory specimens during the acute phase of infection. The lowest  concentration of SARS-CoV-2 viral copies this assay can detect is 250  copies / mL. A negative result does not preclude SARS-CoV-2 infection  and should not be used as the sole basis for treatment or other  patient management decisions.  A negative result may occur with  improper specimen collection / handling, submission of specimen other  than nasopharyngeal swab, presence of viral mutation(s) within the  areas targeted by this assay, and  inadequate number of viral copies  (<250 copies / mL). A negative result must be combined with clinical  observations, patient history, and epidemiological information. If result is POSITIVE SARS-CoV-2 target nucleic acids are DETECTED. The SARS-CoV-2 RNA is generally detectable in upper and lower  respiratory specimens dur ing the acute phase of infection.  Positive  results are indicative of active infection with SARS-CoV-2.  Clinical  correlation with patient history and other diagnostic information is  necessary to determine patient infection status.  Positive results do  not rule out bacterial infection or co-infection with other viruses. If result is PRESUMPTIVE POSTIVE SARS-CoV-2 nucleic acids MAY BE PRESENT.   A presumptive positive result was obtained on the submitted specimen  and confirmed on repeat testing.  While 2019 novel coronavirus  (SARS-CoV-2) nucleic acids may be present in the submitted sample  additional confirmatory testing may be necessary for epidemiological  and / or clinical management purposes  to differentiate between  SARS-CoV-2 and other Sarbecovirus currently known to infect humans.  If clinically indicated additional testing with an alternate test  methodology [phone]) is advised. The SARS-CoV-2 RNA is generally  detectable in upper and lower respiratory sp ecimens during the acute  phase of infection. The expected result is Negative. Fact Sheet for Patients:  StrictlyIdeas.no Fact Sheet for Healthcare Providers: BankingDealers.co.za This test is not yet approved or cleared by the [place] FDA and has been authorized for detection and/or diagnosis of SARS-CoV-2 by FDA under an Emergency Use Authorization (EUA).  This EUA will remain in effect (meaning this test can be used) for the duration of the COVID-19 declaration under Section 564(b)(1) of the Act, 21 U.S.C. section 360bbb-3(b)(1), unless the  authorization is terminated or revoked sooner. Performed at [facility], [place] [address], [place], [place] [address]   MRSA PCR Screening     Status: None   Collection Time: 03/20/19 11:40 PM   Specimen: Nasal Mucosa; Nasopharyngeal  Result Value Ref Range Status   MRSA by PCR NEGATIVE NEGATIVE Final    Comment:        The GeneXpert MRSA Assay (FDA approved for NASAL specimens only), is one component of a comprehensive MRSA colonization surveillance program. It is not intended to diagnose MRSA infection nor to guide or monitor treatment for MRSA infections. Performed at [facility], [place] [address] [place], [place] [address]   SARS Coronavirus 2 (CEPHEID - Performed in [place] hospital lab), Hosp Order     Status: None   Collection Time: 03/27/19  3:54 AM   Specimen: Nasopharyngeal Swab  Result Value Ref Range Status   SARS Coronavirus 2 NEGATIVE NEGATIVE Final    Comment: (NOTE) If result is NEGATIVE SARS-CoV-2 target nucleic acids are NOT DETECTED. The  SARS-CoV-2 RNA is generally detectable in upper and lower  respiratory specimens during the acute phase of infection. The lowest  concentration of SARS-CoV-2 viral copies this assay can detect is 250  copies / mL. A negative result does not preclude SARS-CoV-2 infection  and should not be used as the sole basis for treatment or other  patient management decisions.  A negative result may occur with  improper specimen collection / handling, submission of specimen other  than nasopharyngeal swab, presence of viral mutation(s) within the  areas targeted by this assay, and inadequate number of viral copies  (<250 copies / mL). A negative result must be combined with clinical  observations, patient history, and epidemiological information. If result is POSITIVE SARS-CoV-2 target nucleic acids are DETECTED. The SARS-CoV-2 RNA is generally detectable in upper and lower  respiratory specimens dur ing the acute  phase of infection.  Positive  results are indicative of active infection with SARS-CoV-2.  Clinical  correlation with patient history and other diagnostic information is  necessary to determine patient infection status.  Positive results do  not rule out bacterial infection or co-infection with other viruses. If result is PRESUMPTIVE POSTIVE SARS-CoV-2 nucleic acids MAY BE PRESENT.   A presumptive positive result was obtained on the submitted specimen  and confirmed on repeat testing.  While 2019 novel coronavirus  (SARS-CoV-2) nucleic acids may be present in the submitted sample  additional confirmatory testing may be necessary for epidemiological  and / or clinical management purposes  to differentiate between  SARS-CoV-2 and other Sarbecovirus currently known to infect humans.  If clinically indicated additional testing with an alternate test  methodology [phone]) is advised. The SARS-CoV-2 RNA is generally  detectable in upper and lower respiratory sp ecimens during the acute  phase of infection. The expected result is Negative. Fact Sheet for Patients:  StrictlyIdeas.no Fact Sheet for Healthcare Providers: BankingDealers.co.za This test is not yet approved or cleared by the [place] FDA and has been authorized for detection and/or diagnosis of SARS-CoV-2 by FDA under an Emergency Use Authorization (EUA).  This EUA will remain in effect (meaning this test can be used) for the duration of the COVID-19 declaration under Section 564(b)(1) of the Act, 21 U.S.C. section 360bbb-3(b)(1), unless the authorization is terminated or revoked sooner. Performed at [facility], [place] [address], [place], [place] [address]      Labs: Basic Metabolic Panel: Recent Labs  Lab 03/22/19 0321 03/23/19 0245 03/24/19 0420 03/25/19 0319 03/26/19 0234 03/27/19 0326  NA 135 136 138 134*  --  134*  K 3.5 3.8 3.7 3.5  --  3.5  CL 99 102  104 100  --  102  CO2 25 23 25 23   --  24  GLUCOSE 107* 110* 108* 103*  --  96  BUN 32* 27* 26* 28*  --  29*  CREATININE 1.16* 1.00 0.92 0.99  --  1.00  CALCIUM 9.6 9.4 9.6 9.2  --  8.9  MG 2.3 2.3 2.3 2.2 2.2  --    Liver Function Tests: Recent Labs  Lab 03/27/19 0326  AST 88*  ALT 10  ALKPHOS 89  BILITOT 1.1  PROT 6.4*  ALBUMIN 2.6*   No results for input(s): LIPASE, AMYLASE in the last 168 hours. No results for input(s): AMMONIA in the last 168 hours. CBC: Recent Labs  Lab 03/21/19 0125 03/22/19 0321 03/23/19 0245 03/24/19 0420 03/25/19 0319  WBC 6.3 6.9 7.5 7.6 7.0  HGB 8.0*  8.0* 8.4* 7.9* 8.0*  HCT 24.1* 24.4* 26.5* 24.6* 24.1*  MCV 91.3 90.4 92.7 92.1 93.4  PLT 174 187 173 186 167   Cardiac Enzymes: No results for input(s): CKTOTAL, CKMB, CKMBINDEX, TROPONINI in the last 168 hours. BNP: BNP (last 3 results) Recent Labs    03/30/18 2051 02/02/19 1619 03/20/19 1325  BNP 1,780.8* 2,329.2* 2,182.8*    ProBNP (last 3 results) No results for input(s): PROBNP in the last 8760 hours.  CBG: No results for input(s): GLUCAP in the last 168 hours.     Signed:  Nita Sells MD   [facility] 03/27/2019, 3:15 PM

## 2019-03-27 NOTE — Consult Note (Signed)
   [facility] CM Inpatient Consult   03/27/2019  Carrie [name] Aug 19, 1941 [id number]  Patient screened for extreme high risk score for unplanned readmission score  and for less than 30 day re-hospitalizations, 4 in the past 6 months.   Review of patient's medical record reveals patient hasa  distant outreach from [facility]. Patient with [facility] special H-code per insurance card noted for [facility].  Chart reviewed and reveals patient is currently being considered for a skilled nursing facility transition if receiving insurance approval.  Primary Care Provider is  Lucianne Lei, MD, this office is listed to provide the transition of care follow up.  Plan:  Follow for disposition. If for SNF, her needs will be met at that level of care [[facility].    Please place a [facility] consult as appropriate if disposition is not ANF and for questions contact:   Natividad Brood, RN BSN [facility] Liaison  [phone] business mobile phone Toll free office [phone]  Fax number: [phone] [email or web address]

## 2019-03-28 DIAGNOSIS — Z7401 Bed confinement status: Secondary | ICD-10-CM | POA: Diagnosis not present

## 2019-03-28 DIAGNOSIS — W19XXXD Unspecified fall, subsequent encounter: Secondary | ICD-10-CM | POA: Diagnosis not present

## 2019-03-28 DIAGNOSIS — I639 Cerebral infarction, unspecified: Secondary | ICD-10-CM | POA: Diagnosis not present

## 2019-03-28 DIAGNOSIS — I110 Hypertensive heart disease with heart failure: Secondary | ICD-10-CM | POA: Diagnosis not present

## 2019-03-28 DIAGNOSIS — L89323 Pressure ulcer of left buttock, stage 3: Secondary | ICD-10-CM | POA: Diagnosis not present

## 2019-03-28 DIAGNOSIS — I10 Essential (primary) hypertension: Secondary | ICD-10-CM | POA: Diagnosis not present

## 2019-03-28 DIAGNOSIS — G4700 Insomnia, unspecified: Secondary | ICD-10-CM | POA: Diagnosis not present

## 2019-03-28 DIAGNOSIS — S52531A Colles' fracture of right radius, initial encounter for closed fracture: Secondary | ICD-10-CM | POA: Diagnosis not present

## 2019-03-28 DIAGNOSIS — I2510 Atherosclerotic heart disease of native coronary artery without angina pectoris: Secondary | ICD-10-CM | POA: Diagnosis not present

## 2019-03-28 DIAGNOSIS — K219 Gastro-esophageal reflux disease without esophagitis: Secondary | ICD-10-CM | POA: Diagnosis not present

## 2019-03-28 DIAGNOSIS — Z7901 Long term (current) use of anticoagulants: Secondary | ICD-10-CM | POA: Diagnosis not present

## 2019-03-28 DIAGNOSIS — S52531D Colles' fracture of right radius, subsequent encounter for closed fracture with routine healing: Secondary | ICD-10-CM | POA: Diagnosis not present

## 2019-03-28 DIAGNOSIS — S62101D Fracture of unspecified carpal bone, right wrist, subsequent encounter for fracture with routine healing: Secondary | ICD-10-CM | POA: Diagnosis not present

## 2019-03-28 DIAGNOSIS — I4891 Unspecified atrial fibrillation: Secondary | ICD-10-CM | POA: Diagnosis not present

## 2019-03-28 DIAGNOSIS — Z950 Presence of cardiac pacemaker: Secondary | ICD-10-CM | POA: Diagnosis not present

## 2019-03-28 DIAGNOSIS — N390 Urinary tract infection, site not specified: Secondary | ICD-10-CM | POA: Diagnosis not present

## 2019-03-28 DIAGNOSIS — M2550 Pain in unspecified joint: Secondary | ICD-10-CM | POA: Diagnosis not present

## 2019-03-28 DIAGNOSIS — W19XXXA Unspecified fall, initial encounter: Secondary | ICD-10-CM | POA: Diagnosis not present

## 2019-03-28 DIAGNOSIS — M6281 Muscle weakness (generalized): Secondary | ICD-10-CM | POA: Diagnosis not present

## 2019-03-28 DIAGNOSIS — Z4789 Encounter for other orthopedic aftercare: Secondary | ICD-10-CM | POA: Diagnosis not present

## 2019-03-28 DIAGNOSIS — R410 Disorientation, unspecified: Secondary | ICD-10-CM | POA: Diagnosis not present

## 2019-03-28 DIAGNOSIS — I480 Paroxysmal atrial fibrillation: Secondary | ICD-10-CM | POA: Diagnosis not present

## 2019-03-28 DIAGNOSIS — S6291XD Unspecified fracture of right wrist and hand, subsequent encounter for fracture with routine healing: Secondary | ICD-10-CM | POA: Diagnosis not present

## 2019-03-28 LAB — PROTIME-INR
INR: 1.4 — ABNORMAL HIGH (ref 0.8–1.2)
Prothrombin Time: 16.6 seconds — ABNORMAL HIGH (ref 11.4–15.2)

## 2019-03-28 LAB — HEPARIN LEVEL (UNFRACTIONATED): Heparin Unfractionated: 0.32 IU/mL (ref 0.30–0.70)

## 2019-03-28 MED ORDER — WARFARIN SODIUM 7.5 MG PO TABS: 7.5000 mg | ORAL_TABLET | Freq: Once | ORAL | Status: DC

## 2019-03-28 NOTE — Progress Notes (Signed)
Physical Therapy Treatment Patient Details Name: Carrie [name] MRN: [id number] DOB: 08/30/1941 Today's Date: 03/28/2019    History of Present Illness 78 y.o. 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  Admitted on 03/20/19 following a fall and was found to have R wrist fx. Scheduled for surgery 6/30 currently postponed. ORIF right wrist on 7/2.      PT Comments    Pt admitted with above diagnosis. Pt currently with functional limitations due to the deficits listed below (see PT Problem List). Pt was able to stand and pivot with mod assist with pt maintaining flexed posture throughout.  Pt also performed sit to stands needing mod assist each time.  Has difficulty with balance.  Pt also performed some exercises as well.   Pt will benefit from skilled PT to increase their independence and safety with mobility to allow discharge to the venue listed below.     Follow Up Recommendations  SNF     Equipment Recommendations  Other (comment)(TBD at next venue)    Recommendations for Other Services OT consult     Precautions / Restrictions Precautions Precautions: Fall Restrictions Weight Bearing Restrictions: Yes RUE Weight Bearing: Weight bear through elbow only    Mobility  Bed Mobility Overal bed mobility: Needs Assistance Bed Mobility: Supine to Sit;Sit to Supine     Supine to sit: Mod assist     General bed mobility comments: mod assist to transition to EOB using pad to scoot hips  Transfers Overall transfer level: Needs assistance Equipment used: 1 person hand held assist Transfers: Sit to/from [employer] Transfers Sit to Stand: Mod assist Stand pivot transfers: Mod assist       General transfer comment: Pt stood with mod  assist and pivoted to recliner with mod assist needing assist to move her LEs for pivot.  Pt in somewhat of a flexed posture.  Ambulation/Gait                 Stairs             Wheelchair Mobility    Modified Rankin (Stroke Patients Only)       Balance Overall balance assessment: Needs assistance Sitting-balance support: No upper extremity supported;Feet supported Sitting balance-Leahy Scale: Fair     Standing balance support: Bilateral upper extremity supported Standing balance-Leahy Scale: Zero Standing balance comment: Had support at right elbow and HHa on left for support with somewhat flexed posture.                             Cognition Arousal/Alertness: Awake/alert Behavior During Therapy: Flat affect Overall Cognitive Status: Impaired/Different from baseline Area of Impairment: Following commands;Safety/judgement;Awareness;Problem solving                       Following Commands: Follows one step commands with increased time Safety/Judgement: Decreased awareness of safety Awareness: Emergent Problem Solving: Slow processing;Decreased initiation;Difficulty sequencing;Requires verbal cues;Requires tactile cues General Comments: pt with flat affect and 1 word responses, very slow processing noted      Exercises General Exercises - Lower Extremity Ankle Circles/Pumps: AROM;Both;10 reps;Supine Quad Sets: AROM;Both;10 reps;Supine Long Arc Quad: AROM;Both;10 reps;Seated Hip Flexion/Marching: AROM;Both;10 reps;Seated    General Comments General comments (skin integrity, edema, etc.): VSS on 2LO2  Pertinent Vitals/Pain Pain Assessment: Faces Faces Pain Scale: Hurts little more Pain Location: R wrist Pain Descriptors / Indicators: Guarding;Grimacing Pain Intervention(s): Limited activity within patient's tolerance;Monitored during session;Repositioned    Home Living                      Prior Function             PT Goals (current goals can now be found in the care plan section) Acute Rehab PT Goals Patient Stated Goal: to lay back down Progress towards PT goals: Progressing toward goals    Frequency    Min 2X/week      PT Plan Current plan remains appropriate    Co-evaluation              AM-PAC PT "6 Clicks" Mobility   Outcome Measure  Help needed turning from your back to your side while in a flat bed without using bedrails?: A Little Help needed moving from lying on your back to sitting on the side of a flat bed without using bedrails?: A Lot Help needed moving to and from a bed to a chair (including a wheelchair)?: A Lot Help needed standing up from a chair using your arms (e.g., wheelchair or bedside chair)?: A Lot Help needed to walk in hospital room?: A Lot Help needed climbing 3-5 steps with a railing? : Total 6 Click Score: 12    End of Session Equipment Utilized During Treatment: Gait belt;Oxygen Activity Tolerance: Patient limited by fatigue Patient left: in chair;with call bell/phone within reach;with chair alarm set Nurse Communication: Mobility status PT Visit Diagnosis: Unsteadiness on feet (R26.81);Other abnormalities of gait and mobility (R26.89);Muscle weakness (generalized) (M62.81);Difficulty in walking, not elsewhere classified (R26.2);Dizziness and giddiness (R42);History of falling (Z91.81);Repeated falls (R29.6);Pain Pain - Right/Left: Right Pain - part of body: (wrist)     Time: 1000-1011 PT Time Calculation (min) (ACUTE ONLY): 11 min  Charges:  $Therapeutic Activity: 8-22 mins                     [place] Pager:  [phone]  Office:  [place] 03/28/2019, 10:29 AM

## 2019-03-28 NOTE — TOC Transition Note (Signed)
Transition of Care [facility]) - CM/SW Discharge Note   Patient Details  Name: Carrie [name] MRN: [id number] Date of Birth: 01/18/1941  [facility] CM/SW Contact:  Alberteen Sam, LCSW Phone Number: 03/28/2019, 11:23 AM   Clinical Narrative:     Patient will DC to: [place] date: 03/28/2019 Family notified: Jeneen Rinks (spouse) Transport by: Corey Harold  Per MD patient ready for DC to [facility] . RN, patient, patient's family, and facility notified of DC. Discharge Summary sent to facility. RN given number for report [phone] Room 101B. DC packet on chart. Ambulance transport requested for patient.  CSW signing off.  [place], [place]   Final next level of care: Skilled Nursing Facility Barriers to Discharge: No Barriers Identified   Patient Goals and CMS Choice Patient states their goals for this hospitalization and ongoing recovery are:: "To be able to use my right wrist." CMS Medicare.gov Compare Post Acute Care list provided to:: Patient Choice offered to / list presented to : Patient  Discharge Placement PASRR number recieved: 03/22/19            Patient chooses bed at: [facility] Patient to be transferred to facility by: [place] Name of family member notified: Jeneen Rinks (spouse) Patient and family notified of of transfer: 03/28/19  Discharge Plan and Services     Post Acute Care Choice: [place]                               Social Determinants of Health (SDOH) Interventions     Readmission Risk Interventions No flowsheet data found.

## 2019-03-28 NOTE — Progress Notes (Signed)
ANTICOAGULATION CONSULT NOTE - Follow-Up Consult  Pharmacy Consult for Heparin / warfarin Indication: mechanical mitral valve, atrial fibrillation  Allergies  Allergen Reactions  . Codeine Nausea And Vomiting    Patient Measurements: Height: 5\' 1"  (154.9 cm) Weight: 112 lb 7 oz (51 kg) IBW/kg (Calculated) : 47.8 Heparin Dosing Weight: 54kg  Vital Signs: Temp: 98 F (36.7 C) (07/07 0801) Temp Source: Oral (07/07 0801) BP: 108/55 (07/07 0801) Pulse Rate: 73 (07/07 0801)  Labs: Recent Labs    03/26/19 0234 03/27/19 0326 03/28/19 0215  LABPROT 16.2* 16.1* 16.6*  INR 1.3* 1.3* 1.4*  HEPARINUNFRC 0.37 0.34 0.32  CREATININE  --  1.00  --     Estimated Creatinine Clearance: 35.6 mL/min (by C-G formula based on SCr of 1 mg/dL).  Assessment: Carrie [name] on warfarin at home for hx of AFib and mechanical MVR. Pt admitted s/p fall with plans for surgery, however pt received inadvertent heparin overdose on 6/29 pm. Pt received protamine to reverse heparin and vitamin K for INR of 2.3.   Pt status post wrist surgery on 7/2. Per MD 7/3, ok to resume heparin infusion and warfarin. INR 1.4 7/7 despite boost doses of warfarin  H/H stable. No DDI noted.  Heparin drip 1000 uts/hr HL 0.34 at goal   PTA warfarin dose 2.5mg  Tu/Sat, 5mg  AOD   Goal of Therapy:  Heparin level 0.3-0.5 units/ml Monitor platelets by anticoagulation protocol: Yes   Plan:  Continue heparin infusion to 1000 units/hr Warfarin 7.5mg  x 1 repeat 7/7 Daily HL, Protime, CBC  [name] [name], PharmD, [place] PGY1 Pharmacy Resident [employer]: [phone] 03/28/2019 9:57 AM

## 2019-03-28 NOTE — Progress Notes (Signed)
Report called to [employer].  Discharge summary prepared and placed in discharge envelop.  Patient via Carrie [name] to [employer].  Patient belongings and discharge summary accompany the patient

## 2019-03-28 NOTE — Plan of Care (Signed)

## 2019-03-28 NOTE — Progress Notes (Signed)
CSW has reached out to [facility] to ensure they have insurance auth and are ready to accept patient today, awaiting confirmation.   [place], [place]

## 2019-03-28 NOTE — Progress Notes (Signed)
No changes to d/c plan Ready for facility for rehab when can be arranged per CSW  Verneita Griffes, MD [facility] 10:38 AM

## 2019-03-30 ENCOUNTER — Other Ambulatory Visit: Payer: Self-pay

## 2019-03-30 DIAGNOSIS — I10 Essential (primary) hypertension: Secondary | ICD-10-CM | POA: Diagnosis not present

## 2019-03-30 DIAGNOSIS — S62101D Fracture of unspecified carpal bone, right wrist, subsequent encounter for fracture with routine healing: Secondary | ICD-10-CM | POA: Diagnosis not present

## 2019-03-30 DIAGNOSIS — I480 Paroxysmal atrial fibrillation: Secondary | ICD-10-CM | POA: Diagnosis not present

## 2019-03-30 DIAGNOSIS — Z7901 Long term (current) use of anticoagulants: Secondary | ICD-10-CM | POA: Diagnosis not present

## 2019-04-04 DIAGNOSIS — S6291XD Unspecified fracture of right wrist and hand, subsequent encounter for fracture with routine healing: Secondary | ICD-10-CM | POA: Diagnosis not present

## 2019-04-04 DIAGNOSIS — M6281 Muscle weakness (generalized): Secondary | ICD-10-CM | POA: Diagnosis not present

## 2019-04-04 DIAGNOSIS — G4700 Insomnia, unspecified: Secondary | ICD-10-CM | POA: Diagnosis not present

## 2019-04-06 DIAGNOSIS — L89323 Pressure ulcer of left buttock, stage 3: Secondary | ICD-10-CM | POA: Diagnosis not present

## 2019-04-12 ENCOUNTER — Telehealth: Payer: Self-pay

## 2019-04-12 ENCOUNTER — Telehealth: Payer: Self-pay | Admitting: Adult Health

## 2019-04-12 ENCOUNTER — Inpatient Hospital Stay: Payer: Self-pay | Admitting: Adult Health

## 2019-04-12 NOTE — Telephone Encounter (Signed)
Patient was a no call/no show for their appointment today.   

## 2019-04-12 NOTE — Telephone Encounter (Signed)
Pt husband called and said his wife is at a rehab facility and cannot make appt she will call to r/s when she gets out dg

## 2019-04-13 ENCOUNTER — Encounter: Payer: Self-pay | Admitting: Adult Health

## 2019-04-13 DIAGNOSIS — L89323 Pressure ulcer of left buttock, stage 3: Secondary | ICD-10-CM | POA: Diagnosis not present

## 2019-04-17 DIAGNOSIS — S52531A Colles' fracture of right radius, initial encounter for closed fracture: Secondary | ICD-10-CM | POA: Diagnosis not present

## 2019-04-17 DIAGNOSIS — S52531D Colles' fracture of right radius, subsequent encounter for closed fracture with routine healing: Secondary | ICD-10-CM | POA: Diagnosis not present

## 2019-04-20 DIAGNOSIS — I4891 Unspecified atrial fibrillation: Secondary | ICD-10-CM | POA: Diagnosis not present

## 2019-04-20 DIAGNOSIS — G4700 Insomnia, unspecified: Secondary | ICD-10-CM | POA: Diagnosis not present

## 2019-04-20 DIAGNOSIS — I110 Hypertensive heart disease with heart failure: Secondary | ICD-10-CM | POA: Diagnosis not present

## 2019-04-20 DIAGNOSIS — Z4789 Encounter for other orthopedic aftercare: Secondary | ICD-10-CM | POA: Diagnosis not present

## 2019-04-23 DIAGNOSIS — I0981 Rheumatic heart failure: Secondary | ICD-10-CM | POA: Diagnosis not present

## 2019-04-23 DIAGNOSIS — I2510 Atherosclerotic heart disease of native coronary artery without angina pectoris: Secondary | ICD-10-CM | POA: Diagnosis not present

## 2019-04-23 DIAGNOSIS — I252 Old myocardial infarction: Secondary | ICD-10-CM | POA: Diagnosis not present

## 2019-04-23 DIAGNOSIS — N183 Chronic kidney disease, stage 3 (moderate): Secondary | ICD-10-CM | POA: Diagnosis not present

## 2019-04-23 DIAGNOSIS — I5042 Chronic combined systolic (congestive) and diastolic (congestive) heart failure: Secondary | ICD-10-CM | POA: Diagnosis not present

## 2019-04-23 DIAGNOSIS — I272 Pulmonary hypertension, unspecified: Secondary | ICD-10-CM | POA: Diagnosis not present

## 2019-04-23 DIAGNOSIS — I4811 Longstanding persistent atrial fibrillation: Secondary | ICD-10-CM | POA: Diagnosis not present

## 2019-04-23 DIAGNOSIS — I495 Sick sinus syndrome: Secondary | ICD-10-CM | POA: Diagnosis not present

## 2019-04-23 DIAGNOSIS — S62101D Fracture of unspecified carpal bone, right wrist, subsequent encounter for fracture with routine healing: Secondary | ICD-10-CM | POA: Diagnosis not present

## 2019-04-23 DIAGNOSIS — I051 Rheumatic mitral insufficiency: Secondary | ICD-10-CM | POA: Diagnosis not present

## 2019-04-23 DIAGNOSIS — I69018 Other symptoms and signs involving cognitive functions following nontraumatic subarachnoid hemorrhage: Secondary | ICD-10-CM | POA: Diagnosis not present

## 2019-04-23 DIAGNOSIS — I130 Hypertensive heart and chronic kidney disease with heart failure and stage 1 through stage 4 chronic kidney disease, or unspecified chronic kidney disease: Secondary | ICD-10-CM | POA: Diagnosis not present

## 2019-04-26 DIAGNOSIS — I051 Rheumatic mitral insufficiency: Secondary | ICD-10-CM | POA: Diagnosis not present

## 2019-04-26 DIAGNOSIS — I5042 Chronic combined systolic (congestive) and diastolic (congestive) heart failure: Secondary | ICD-10-CM | POA: Diagnosis not present

## 2019-04-26 DIAGNOSIS — I4811 Longstanding persistent atrial fibrillation: Secondary | ICD-10-CM | POA: Diagnosis not present

## 2019-04-26 DIAGNOSIS — I495 Sick sinus syndrome: Secondary | ICD-10-CM | POA: Diagnosis not present

## 2019-04-26 DIAGNOSIS — S62101D Fracture of unspecified carpal bone, right wrist, subsequent encounter for fracture with routine healing: Secondary | ICD-10-CM | POA: Diagnosis not present

## 2019-04-26 DIAGNOSIS — I272 Pulmonary hypertension, unspecified: Secondary | ICD-10-CM | POA: Diagnosis not present

## 2019-04-26 DIAGNOSIS — N183 Chronic kidney disease, stage 3 (moderate): Secondary | ICD-10-CM | POA: Diagnosis not present

## 2019-04-26 DIAGNOSIS — I130 Hypertensive heart and chronic kidney disease with heart failure and stage 1 through stage 4 chronic kidney disease, or unspecified chronic kidney disease: Secondary | ICD-10-CM | POA: Diagnosis not present

## 2019-04-26 DIAGNOSIS — I252 Old myocardial infarction: Secondary | ICD-10-CM | POA: Diagnosis not present

## 2019-04-26 DIAGNOSIS — I2510 Atherosclerotic heart disease of native coronary artery without angina pectoris: Secondary | ICD-10-CM | POA: Diagnosis not present

## 2019-04-26 DIAGNOSIS — I0981 Rheumatic heart failure: Secondary | ICD-10-CM | POA: Diagnosis not present

## 2019-04-26 DIAGNOSIS — I69018 Other symptoms and signs involving cognitive functions following nontraumatic subarachnoid hemorrhage: Secondary | ICD-10-CM | POA: Diagnosis not present

## 2019-04-27 DIAGNOSIS — I130 Hypertensive heart and chronic kidney disease with heart failure and stage 1 through stage 4 chronic kidney disease, or unspecified chronic kidney disease: Secondary | ICD-10-CM | POA: Diagnosis not present

## 2019-04-27 DIAGNOSIS — I272 Pulmonary hypertension, unspecified: Secondary | ICD-10-CM | POA: Diagnosis not present

## 2019-04-27 DIAGNOSIS — N183 Chronic kidney disease, stage 3 (moderate): Secondary | ICD-10-CM | POA: Diagnosis not present

## 2019-04-27 DIAGNOSIS — I495 Sick sinus syndrome: Secondary | ICD-10-CM | POA: Diagnosis not present

## 2019-04-27 DIAGNOSIS — I5042 Chronic combined systolic (congestive) and diastolic (congestive) heart failure: Secondary | ICD-10-CM | POA: Diagnosis not present

## 2019-04-27 DIAGNOSIS — I2510 Atherosclerotic heart disease of native coronary artery without angina pectoris: Secondary | ICD-10-CM | POA: Diagnosis not present

## 2019-04-27 DIAGNOSIS — I69018 Other symptoms and signs involving cognitive functions following nontraumatic subarachnoid hemorrhage: Secondary | ICD-10-CM | POA: Diagnosis not present

## 2019-04-27 DIAGNOSIS — S62101D Fracture of unspecified carpal bone, right wrist, subsequent encounter for fracture with routine healing: Secondary | ICD-10-CM | POA: Diagnosis not present

## 2019-04-27 DIAGNOSIS — I4811 Longstanding persistent atrial fibrillation: Secondary | ICD-10-CM | POA: Diagnosis not present

## 2019-04-27 DIAGNOSIS — I252 Old myocardial infarction: Secondary | ICD-10-CM | POA: Diagnosis not present

## 2019-04-27 DIAGNOSIS — I0981 Rheumatic heart failure: Secondary | ICD-10-CM | POA: Diagnosis not present

## 2019-04-27 DIAGNOSIS — I051 Rheumatic mitral insufficiency: Secondary | ICD-10-CM | POA: Diagnosis not present

## 2019-04-28 ENCOUNTER — Ambulatory Visit: Payer: Medicare Other | Admitting: Cardiovascular Disease

## 2019-04-28 DIAGNOSIS — I5042 Chronic combined systolic (congestive) and diastolic (congestive) heart failure: Secondary | ICD-10-CM | POA: Diagnosis not present

## 2019-04-28 DIAGNOSIS — N183 Chronic kidney disease, stage 3 (moderate): Secondary | ICD-10-CM | POA: Diagnosis not present

## 2019-04-28 DIAGNOSIS — I69018 Other symptoms and signs involving cognitive functions following nontraumatic subarachnoid hemorrhage: Secondary | ICD-10-CM | POA: Diagnosis not present

## 2019-04-28 DIAGNOSIS — I272 Pulmonary hypertension, unspecified: Secondary | ICD-10-CM | POA: Diagnosis not present

## 2019-04-28 DIAGNOSIS — S62101D Fracture of unspecified carpal bone, right wrist, subsequent encounter for fracture with routine healing: Secondary | ICD-10-CM | POA: Diagnosis not present

## 2019-04-28 DIAGNOSIS — I130 Hypertensive heart and chronic kidney disease with heart failure and stage 1 through stage 4 chronic kidney disease, or unspecified chronic kidney disease: Secondary | ICD-10-CM | POA: Diagnosis not present

## 2019-04-28 DIAGNOSIS — I495 Sick sinus syndrome: Secondary | ICD-10-CM | POA: Diagnosis not present

## 2019-04-28 DIAGNOSIS — I051 Rheumatic mitral insufficiency: Secondary | ICD-10-CM | POA: Diagnosis not present

## 2019-04-28 DIAGNOSIS — I252 Old myocardial infarction: Secondary | ICD-10-CM | POA: Diagnosis not present

## 2019-04-28 DIAGNOSIS — I4811 Longstanding persistent atrial fibrillation: Secondary | ICD-10-CM | POA: Diagnosis not present

## 2019-04-28 DIAGNOSIS — I2510 Atherosclerotic heart disease of native coronary artery without angina pectoris: Secondary | ICD-10-CM | POA: Diagnosis not present

## 2019-04-28 DIAGNOSIS — I0981 Rheumatic heart failure: Secondary | ICD-10-CM | POA: Diagnosis not present

## 2019-04-29 DIAGNOSIS — E162 Hypoglycemia, unspecified: Secondary | ICD-10-CM | POA: Diagnosis not present

## 2019-04-29 DIAGNOSIS — E161 Other hypoglycemia: Secondary | ICD-10-CM | POA: Diagnosis not present

## 2019-04-29 DIAGNOSIS — R404 Transient alteration of awareness: Secondary | ICD-10-CM | POA: Diagnosis not present

## 2019-04-29 DIAGNOSIS — R0602 Shortness of breath: Secondary | ICD-10-CM | POA: Diagnosis not present

## 2019-05-23 DIAGNOSIS — 419620001 Death: Secondary | SNOMED CT | POA: Diagnosis not present

## 2019-05-23 DEATH — deceased

## 2019-05-30 ENCOUNTER — Other Ambulatory Visit: Payer: Self-pay

## 2019-05-30 NOTE — Patient Outreach (Signed)
First attempt to obtain mRs. No answer. No voicemail.

## 2019-06-01 ENCOUNTER — Other Ambulatory Visit: Payer: Self-pay

## 2019-06-01 NOTE — Patient Outreach (Signed)
Second attempt to obtain mRs. Now neither number is working, including number for husband (listed on DPR).

## 2019-06-05 ENCOUNTER — Other Ambulatory Visit: Payer: Self-pay

## 2019-06-05 NOTE — Patient Outreach (Signed)
Telephone outreach to patient's family to obtain mRs was successfully completed. mRs= 6. Patient passed on 05/08/19.

## 2020-03-19 IMAGING — DX CHEST - 2 VIEW
2 series · 2 of 2 positions shown · non-contrast
Comparison: Yesterday

CLINICAL DATA: Hemoptysis

EXAM:
CHEST - 2 VIEW

[chest lat]
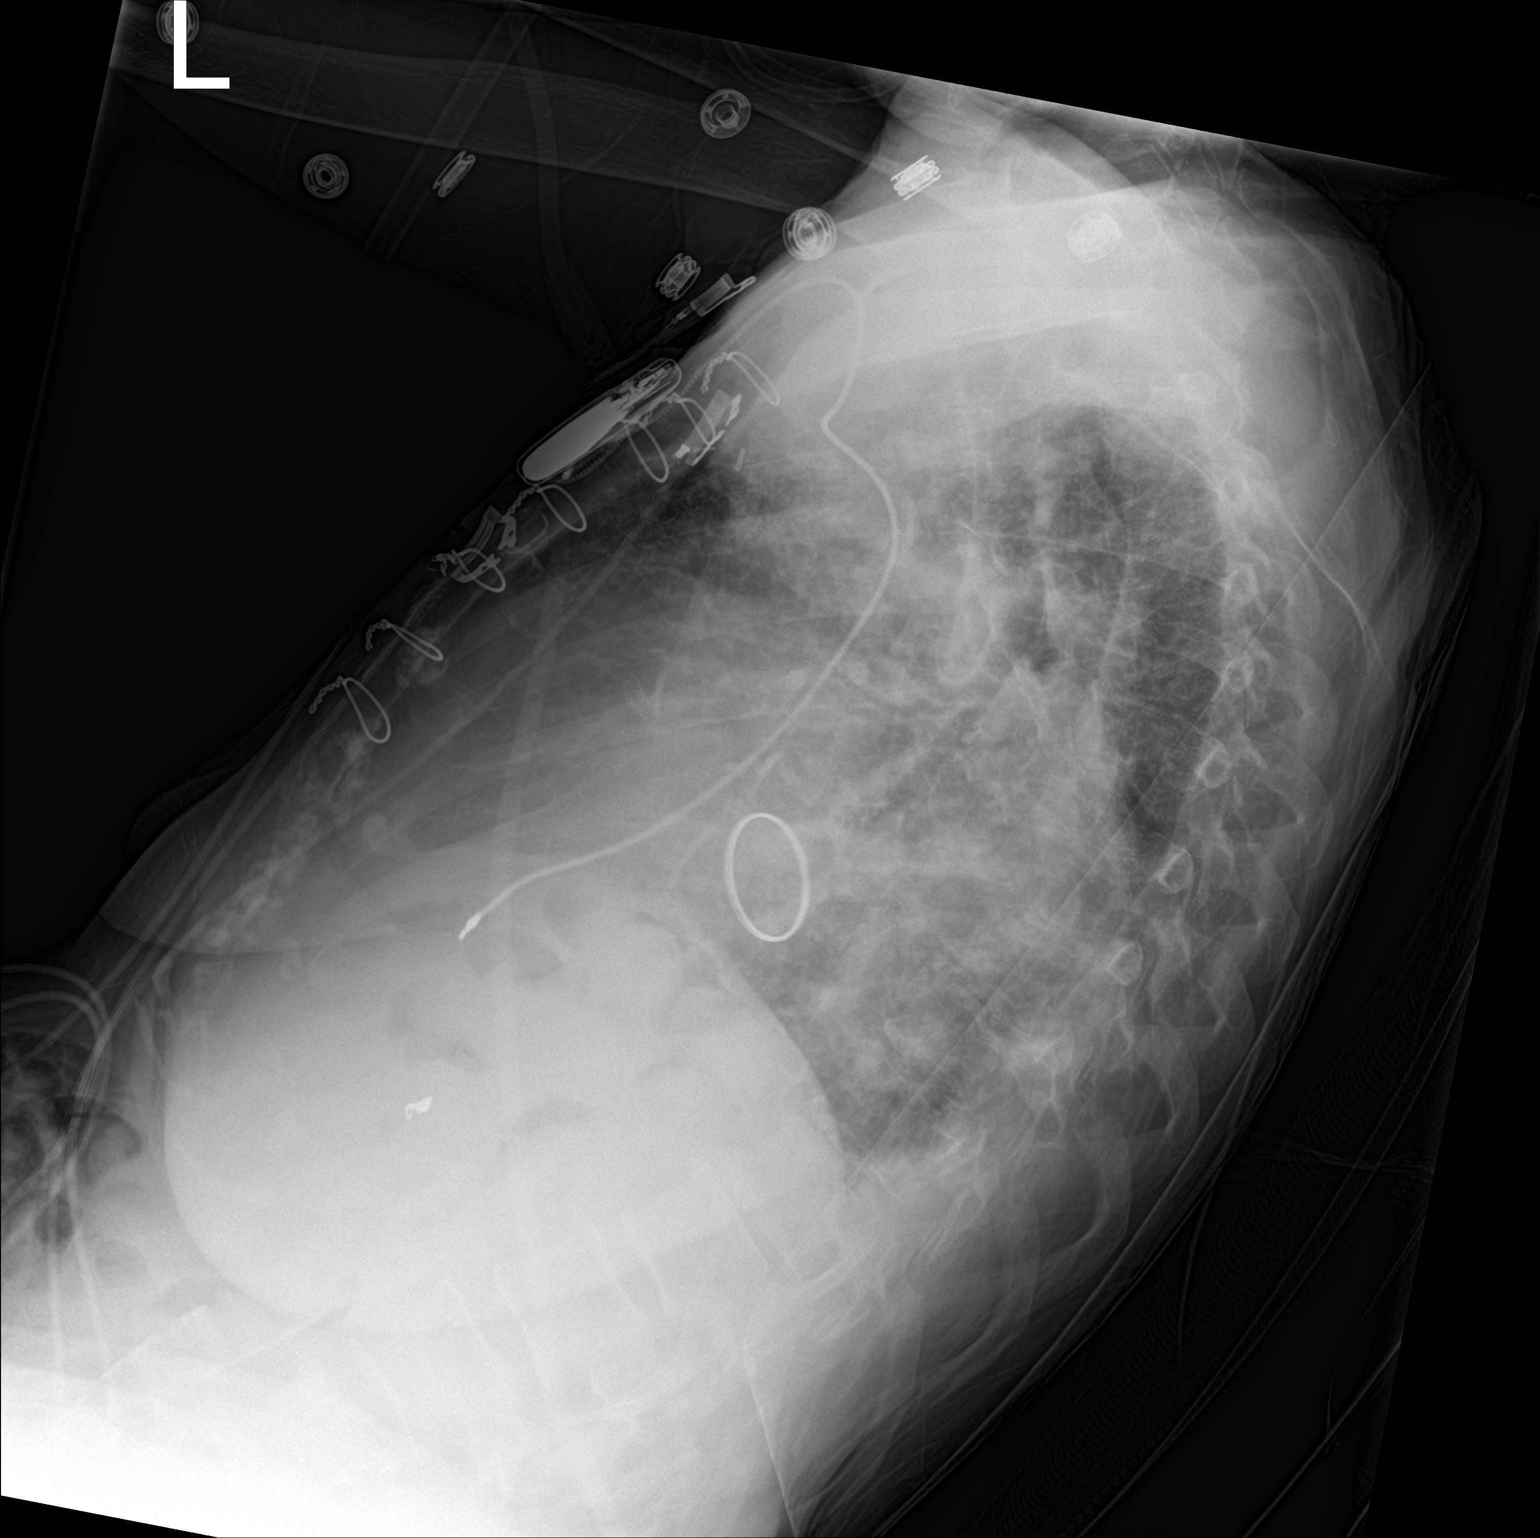

[chest ap]
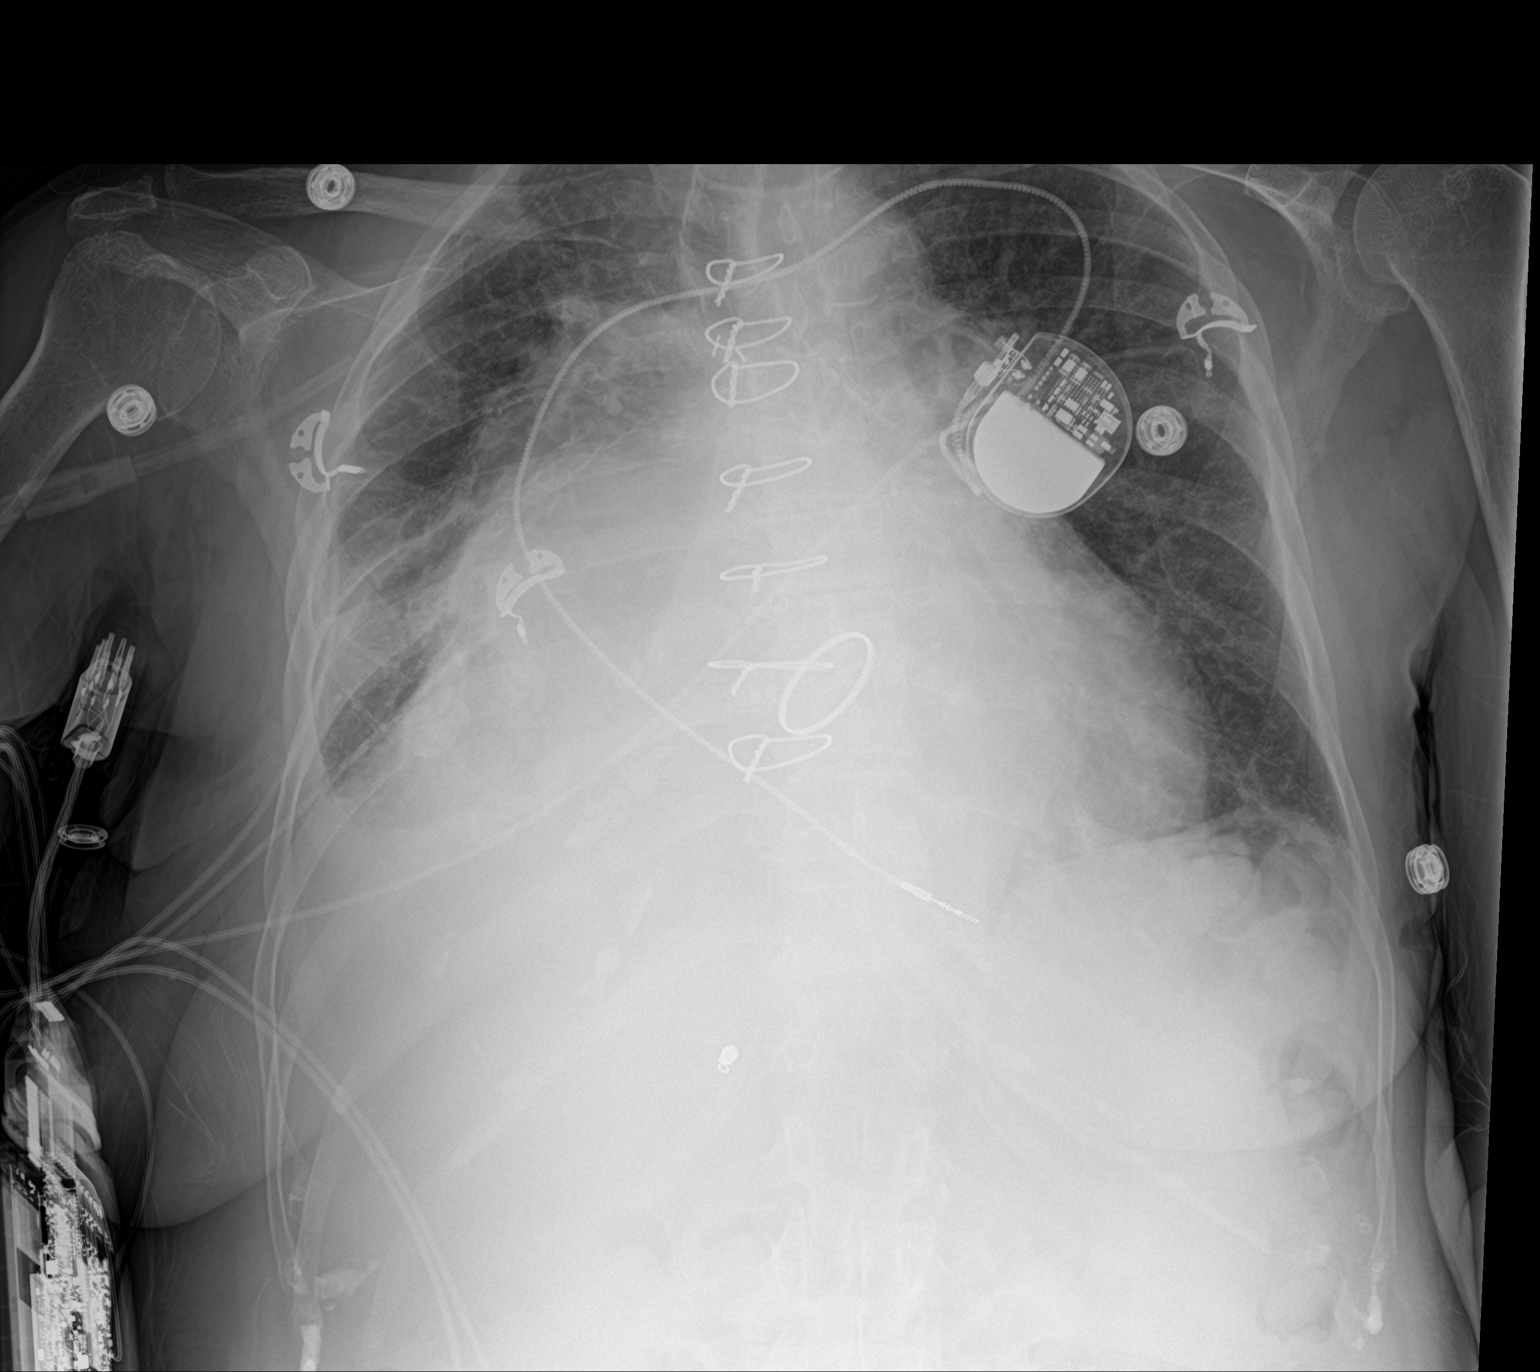

[2 of 2 positions shown; findings below may reference images not displayed]

FINDINGS: Cardiopericardial enlargement and vascular pedicle widening. There
has been mitral valve replacement. Vascular congestion and right
pleural effusion with presumed atelectasis. No pneumothorax. Single
chamber pacer into the right ventricle.
IMPRESSION: Stable cardiomegaly and vascular congestion with small right pleural
effusion.

## 2020-03-19 IMAGING — CT CT HEAD WITHOUT CONTRAST
3 series · 15 of 47 positions shown, 18 images · non-contrast
Comparison: 02/22/2019.

CLINICAL DATA: Subdural hemorrhage follow-up.

EXAM:
CT HEAD WITHOUT CONTRAST
TECHNIQUE: Contiguous axial images were obtained from the base of the skull
through the vertex without intravenous contrast.

[Series 3: head 5.0 h30s · axial · 0.40mm/px · z∈[-84,+51]mm · 9 of 33 slices shown, 12 images]
[im 3/33  brain]
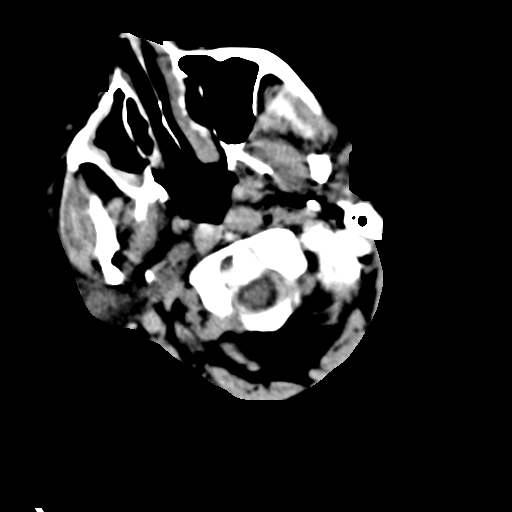
[im 3/33  bone]
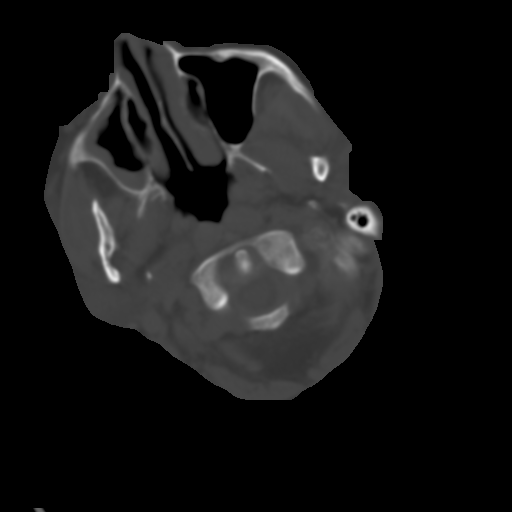
[im 6/33  brain]
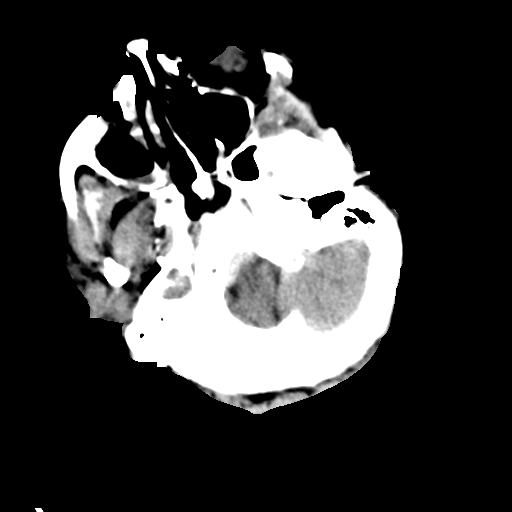
[im 9/33  brain]
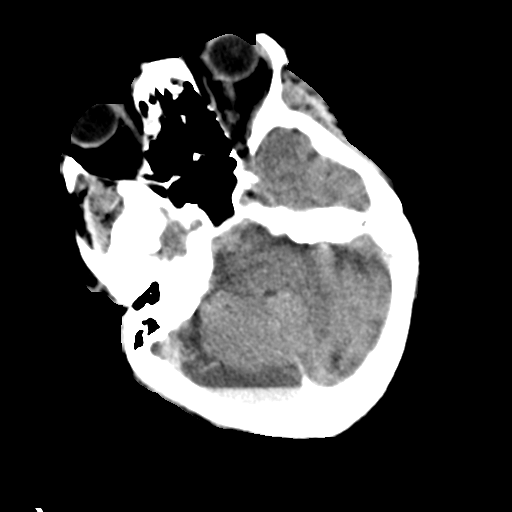
[im 13/33  brain]
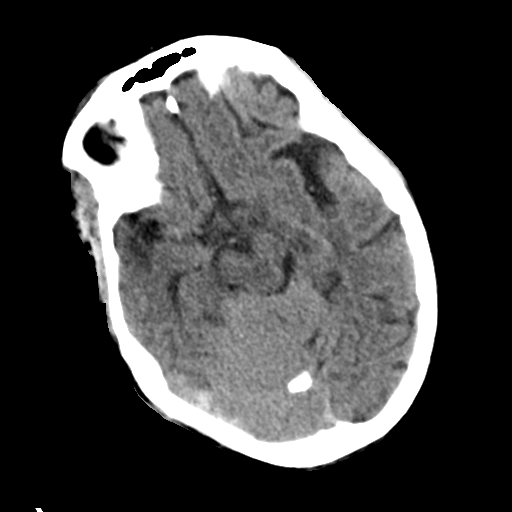
[im 17/33  brain]
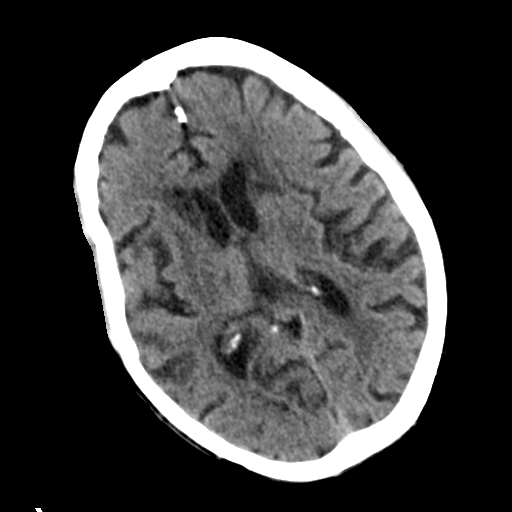
[im 17/33  bone]
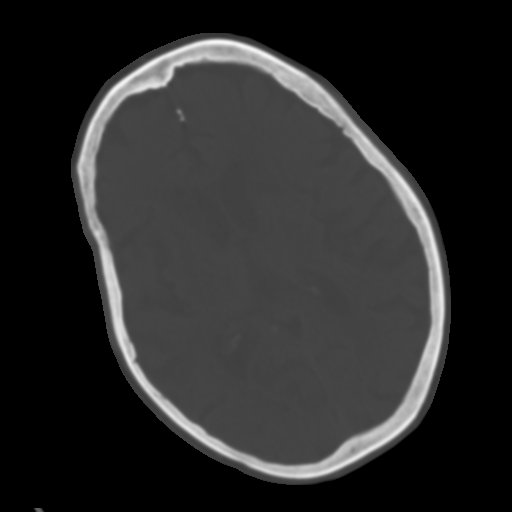
[im 20/33  brain]
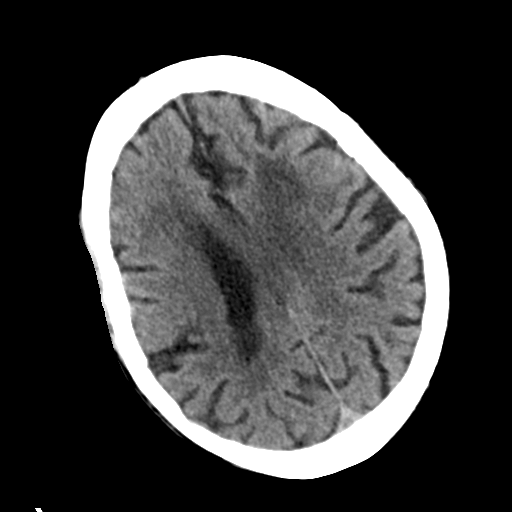
[im 24/33  brain]
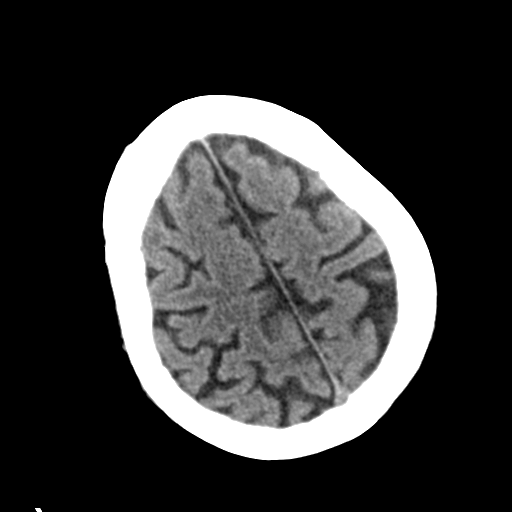
[im 27/33  brain]
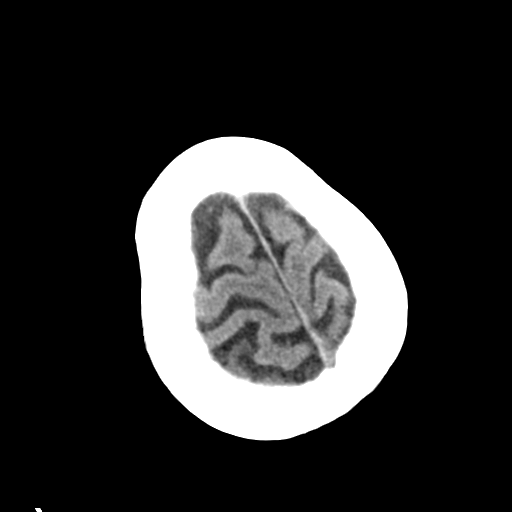
[im 30/33  brain]
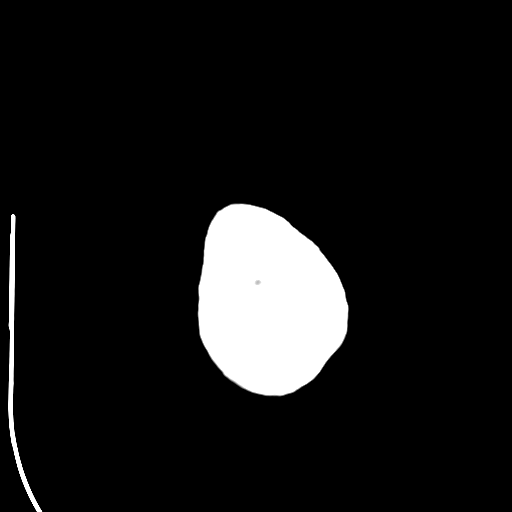
[im 30/33  bone]
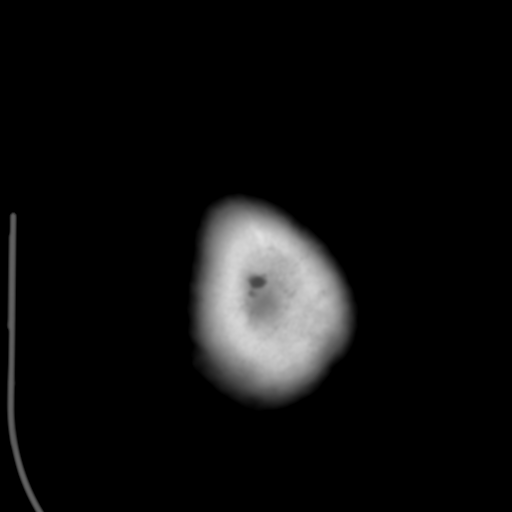

[Series 5: head 3.0 mpr cor · coronal · 0.31mm/px · 3 of 71 slices shown]
[im 24/71  brain]
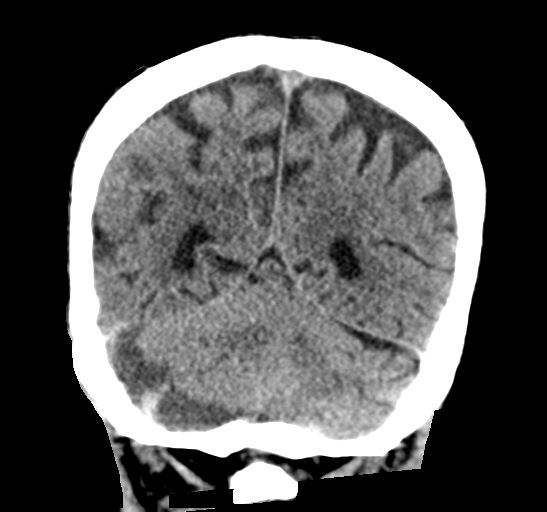
[im 32/71  brain]
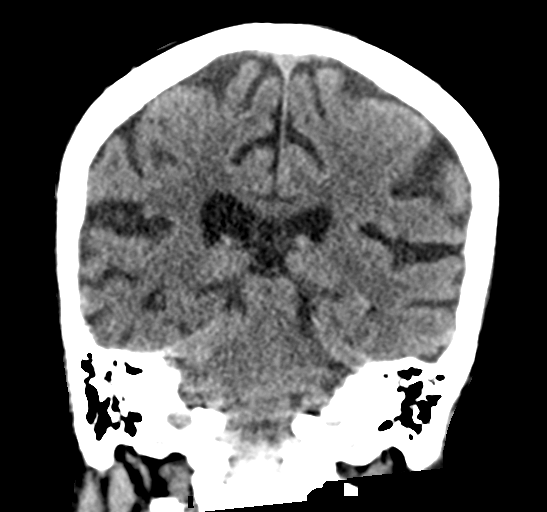
[im 39/71  brain]
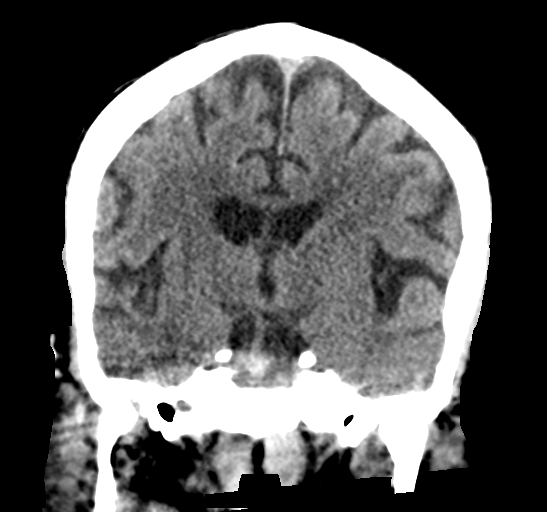

[Series 6: head 3.0 mpr sag · sagittal · 0.31mm/px · 3 of 59 slices shown]
[im 20/59  brain]
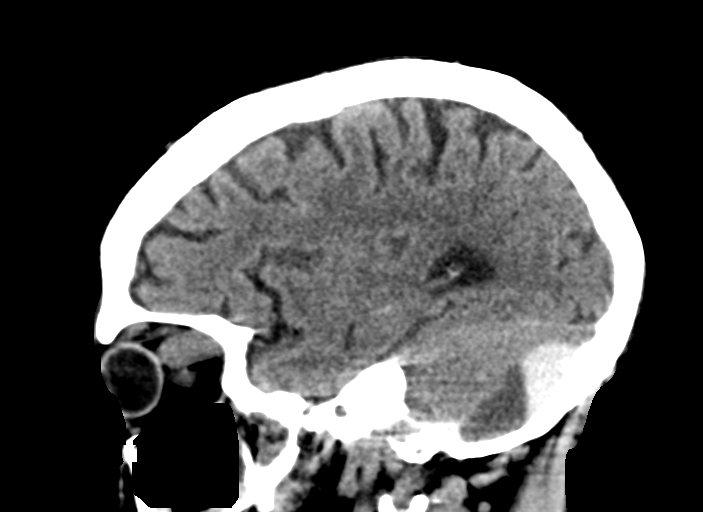
[im 30/59  brain]
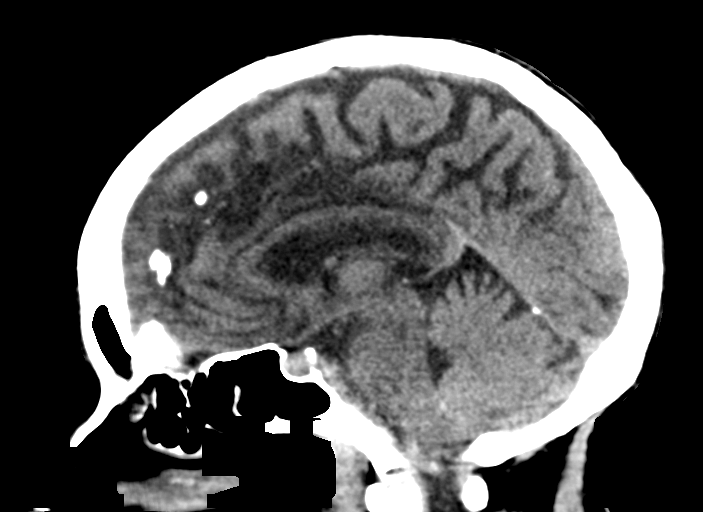
[im 39/59  brain]
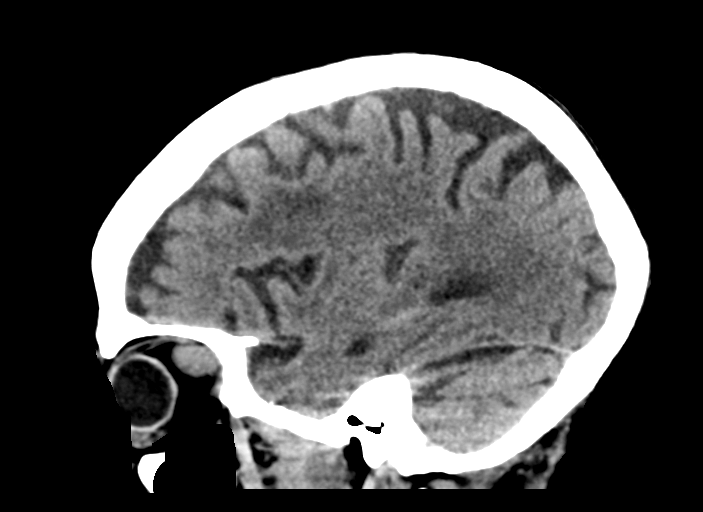

[15 of 47 positions shown; findings below may reference images not displayed]

FINDINGS: Brain: Unchanged, and unusual RIGHT posterior fossa extra-axial
hematoma, favored to be subdural, with hematocrit level of dense red
cells above which is layering supernatant. Overall thickness is no
worse, may be minimally improved, 10-12 mm. There is still mass
effect on the fourth ventricle with RIGHT-to-LEFT shift, which
remains concerning.

In the prepontine cistern, subarachnoid blood is observed, also no
worse.

No parenchymal hemorrhage or hydrocephalus. Atrophy and small vessel
disease.

Vascular: Calcification of the cavernous internal carotid arteries
consistent with cerebrovascular atherosclerotic disease. No signs of
intracranial large vessel occlusion.

Skull: Surprisingly, no visible skull fracture or sutural diastasis.
No pneumocephalus.

Sinuses/Orbits: Unchanged sinuses. BILATERAL orbital varices.

Other: None.
IMPRESSION: Stable to slightly improved RIGHT posterior fossa extra-axial
hematoma, favored to be subdural. Continued surveillance is
warranted.
